# Patient Record
Sex: Male | Born: 1945
Health system: Southern US, Community
[De-identification: ages and names within clinical notes are randomized; demographics above are authoritative.]

## PROBLEM LIST (undated history)

## (undated) DIAGNOSIS — I10 Essential (primary) hypertension: Secondary | ICD-10-CM

## (undated) DIAGNOSIS — E785 Hyperlipidemia, unspecified: Secondary | ICD-10-CM

## (undated) DIAGNOSIS — I509 Heart failure, unspecified: Secondary | ICD-10-CM

## (undated) DIAGNOSIS — N189 Chronic kidney disease, unspecified: Secondary | ICD-10-CM

## (undated) HISTORY — PX: NO PAST SURGERIES: SHX2092

## (undated) HISTORY — DX: Hyperlipidemia, unspecified: E78.5

## (undated) HISTORY — DX: Chronic kidney disease, unspecified: N18.9

---

## 2005-06-29 ENCOUNTER — Emergency Department: Payer: Self-pay | Admitting: Emergency Medicine

## 2007-12-11 ENCOUNTER — Emergency Department: Payer: Self-pay | Admitting: Internal Medicine

## 2016-04-16 DIAGNOSIS — M79675 Pain in left toe(s): Secondary | ICD-10-CM | POA: Diagnosis not present

## 2016-04-16 DIAGNOSIS — M79674 Pain in right toe(s): Secondary | ICD-10-CM | POA: Diagnosis not present

## 2016-04-16 DIAGNOSIS — L600 Ingrowing nail: Secondary | ICD-10-CM | POA: Diagnosis not present

## 2016-04-16 DIAGNOSIS — B353 Tinea pedis: Secondary | ICD-10-CM | POA: Diagnosis not present

## 2016-04-16 DIAGNOSIS — B351 Tinea unguium: Secondary | ICD-10-CM | POA: Diagnosis not present

## 2016-12-23 DIAGNOSIS — R0982 Postnasal drip: Secondary | ICD-10-CM | POA: Diagnosis not present

## 2016-12-23 DIAGNOSIS — Z87891 Personal history of nicotine dependence: Secondary | ICD-10-CM | POA: Diagnosis not present

## 2016-12-23 DIAGNOSIS — I10 Essential (primary) hypertension: Secondary | ICD-10-CM | POA: Diagnosis not present

## 2016-12-23 DIAGNOSIS — K4090 Unilateral inguinal hernia, without obstruction or gangrene, not specified as recurrent: Secondary | ICD-10-CM | POA: Diagnosis not present

## 2017-01-07 DIAGNOSIS — Z01818 Encounter for other preprocedural examination: Secondary | ICD-10-CM | POA: Diagnosis not present

## 2017-01-07 DIAGNOSIS — K4090 Unilateral inguinal hernia, without obstruction or gangrene, not specified as recurrent: Secondary | ICD-10-CM | POA: Diagnosis not present

## 2017-01-27 DIAGNOSIS — K4090 Unilateral inguinal hernia, without obstruction or gangrene, not specified as recurrent: Secondary | ICD-10-CM | POA: Diagnosis not present

## 2017-01-30 ENCOUNTER — Encounter
Admission: RE | Admit: 2017-01-30 | Discharge: 2017-01-30 | Disposition: A | Discharge status: Home / Self Care | Payer: Medicare HMO | Source: Ambulatory Visit | Attending: Surgery | Admitting: Surgery

## 2017-01-30 DIAGNOSIS — K4090 Unilateral inguinal hernia, without obstruction or gangrene, not specified as recurrent: Secondary | ICD-10-CM | POA: Insufficient documentation

## 2017-01-30 DIAGNOSIS — Z01818 Encounter for other preprocedural examination: Secondary | ICD-10-CM | POA: Insufficient documentation

## 2017-01-30 HISTORY — DX: Essential (primary) hypertension: I10

## 2017-01-30 NOTE — Patient Instructions (Signed)
  Your procedure is scheduled on:  Tuesday, February 04, 2017 Report to Same Day Surgery 2nd floor medical mall ([facility]-take elevator on left to 2nd floor.  Check in with surgery information desk.) To find out your arrival time please call [phone] between 1PM - 3PM on Monday, February 19th  Remember: Instructions that are not followed completely may result in serious medical risk, up to and including death, or upon the discretion of your surgeon and anesthesiologist your surgery may need to be rescheduled.    _x___ 1. Do not eat food or drink liquids after midnight. No gum chewing or hard candies.     __x__ 2. No Alcohol for 24 hours before or after surgery.   __x__3. No Smoking for 24 prior to surgery.   ____  4. Bring all medications with you on the day of surgery if instructed.    __x__ 5. Notify your doctor if there is any change in your medical condition     (cold, fever, infections).     Do not wear jewelry, make-up, hairpins, clips or nail polish.  Do not wear lotions, powders, or perfumes. You may wear deodorant.  Do not shave 48 hours prior to surgery. Men may shave face and neck.  Do not bring valuables to the hospital.    [facility] is not responsible for any belongings or valuables.               Contacts, dentures or bridgework may not be worn into surgery.  Leave your suitcase in the car. After surgery it may be brought to your room.  For patients admitted to the hospital, discharge time is determined by your treatment team.   Patients discharged the day of surgery will not be allowed to drive home.  You will need someone to drive you home and stay with you the night of your procedure.    Please read over the following fact sheets that you were given:   [facility] Preparing for Surgery and or MRSA Information   _x___ Take these medicines the morning of surgery with A SIP OF WATER:    1. AMLODIPINE  2.  3.  4.  5.  6.  ____Fleets enema or  Magnesium Citrate as directed.   _x___ Use CHG Soap or sage wipes as directed on instruction sheet   ____ Use inhalers on the day of surgery and bring to hospital day of surgery  ____ Stop metformin 2 days prior to surgery    ____ Take 1/2 of usual insulin dose the night before surgery and none on the morning of           surgery.   ____ Stop Aspirin, Coumadin, Pllavix ,Eliquis, Effient, or Pradaxa  x__ Stop Anti-inflammatories such as Advil, Aleve, Ibuprofen, Motrin, Naproxen,          Naprosyn, Goodies powders or aspirin products. Ok to take Tylenol.   ____ Stop supplements until after surgery.    ____ Bring C-Pap to the hospital.

## 2017-02-03 MED ORDER — CEFAZOLIN SODIUM-DEXTROSE 2-4 GM/100ML-% IV SOLN
2.0000 g | Freq: Once | INTRAVENOUS | Status: AC
  Administered 2017-02-04: 2 g via INTRAVENOUS

## 2017-02-04 ENCOUNTER — Ambulatory Visit: Payer: Medicare HMO | Admitting: Anesthesiology

## 2017-02-04 ENCOUNTER — Encounter: Payer: Self-pay | Admitting: *Deleted

## 2017-02-04 ENCOUNTER — Encounter
Admission: RE | Disposition: A | Discharge status: Home / Self Care | Payer: Self-pay | Source: Ambulatory Visit | Attending: Surgery

## 2017-02-04 ENCOUNTER — Ambulatory Visit
Admission: RE | Admit: 2017-02-04 | Discharge: 2017-02-04 | Disposition: A | Discharge status: Home / Self Care | Payer: Medicare HMO | Source: Ambulatory Visit | Attending: Surgery | Admitting: Surgery

## 2017-02-04 DIAGNOSIS — K4090 Unilateral inguinal hernia, without obstruction or gangrene, not specified as recurrent: Secondary | ICD-10-CM | POA: Diagnosis not present

## 2017-02-04 DIAGNOSIS — I10 Essential (primary) hypertension: Secondary | ICD-10-CM | POA: Diagnosis not present

## 2017-02-04 DIAGNOSIS — Z87891 Personal history of nicotine dependence: Secondary | ICD-10-CM | POA: Insufficient documentation

## 2017-02-04 DIAGNOSIS — D176 Benign lipomatous neoplasm of spermatic cord: Secondary | ICD-10-CM | POA: Diagnosis not present

## 2017-02-04 HISTORY — PX: INGUINAL HERNIA REPAIR: SHX194

## 2017-02-04 SURGERY — REPAIR, HERNIA, INGUINAL, ADULT
Anesthesia: General | Site: Abdomen | Laterality: Right | Wound class: Clean

## 2017-02-04 MED ORDER — FENTANYL CITRATE (PF) 100 MCG/2ML IJ SOLN
25.0000 ug | INTRAMUSCULAR | Status: DC | PRN
  Administered 2017-02-04 (×4): 25 ug via INTRAVENOUS

## 2017-02-04 MED ORDER — HYDROCODONE-ACETAMINOPHEN 5-325 MG PO TABS
ORAL_TABLET | ORAL | Status: AC
  Filled 2017-02-04: qty 1

## 2017-02-04 MED ORDER — FENTANYL CITRATE (PF) 100 MCG/2ML IJ SOLN
INTRAMUSCULAR | Status: AC
  Administered 2017-02-04: 25 ug via INTRAVENOUS
  Filled 2017-02-04: qty 2

## 2017-02-04 MED ORDER — FAMOTIDINE 20 MG PO TABS
ORAL_TABLET | ORAL | Status: AC
  Filled 2017-02-04: qty 1

## 2017-02-04 MED ORDER — PROPOFOL 10 MG/ML IV BOLUS
INTRAVENOUS | Status: DC | PRN
  Administered 2017-02-04: 200 mg via INTRAVENOUS

## 2017-02-04 MED ORDER — HYDROCODONE-ACETAMINOPHEN 5-325 MG PO TABS: 1.0000 | ORAL_TABLET | ORAL | 0 refills | Status: DC | PRN

## 2017-02-04 MED ORDER — PROPOFOL 10 MG/ML IV BOLUS
INTRAVENOUS | Status: AC
  Filled 2017-02-04: qty 20

## 2017-02-04 MED ORDER — ONDANSETRON HCL 4 MG/2ML IJ SOLN
INTRAMUSCULAR | Status: DC | PRN
  Administered 2017-02-04: 4 mg via INTRAVENOUS

## 2017-02-04 MED ORDER — SEVOFLURANE IN SOLN
RESPIRATORY_TRACT | Status: AC
  Filled 2017-02-04: qty 250

## 2017-02-04 MED ORDER — LIDOCAINE HCL (PF) 2 % IJ SOLN
INTRAMUSCULAR | Status: AC
  Filled 2017-02-04: qty 2

## 2017-02-04 MED ORDER — ONDANSETRON HCL 4 MG/2ML IJ SOLN: 4.0000 mg | Freq: Once | INTRAMUSCULAR | Status: DC | PRN

## 2017-02-04 MED ORDER — LABETALOL HCL 5 MG/ML IV SOLN
INTRAVENOUS | Status: DC | PRN
  Administered 2017-02-04: 5 mg via INTRAVENOUS

## 2017-02-04 MED ORDER — EPHEDRINE SULFATE 50 MG/ML IJ SOLN
INTRAMUSCULAR | Status: DC | PRN
  Administered 2017-02-04: 10 mg via INTRAVENOUS

## 2017-02-04 MED ORDER — FAMOTIDINE 20 MG PO TABS
20.0000 mg | ORAL_TABLET | Freq: Once | ORAL | Status: AC
  Administered 2017-02-04: 20 mg via ORAL

## 2017-02-04 MED ORDER — FENTANYL CITRATE (PF) 100 MCG/2ML IJ SOLN
INTRAMUSCULAR | Status: DC | PRN
  Administered 2017-02-04 (×2): 50 ug via INTRAVENOUS

## 2017-02-04 MED ORDER — SUGAMMADEX SODIUM 200 MG/2ML IV SOLN
INTRAVENOUS | Status: DC | PRN
  Administered 2017-02-04: 200 mg via INTRAVENOUS

## 2017-02-04 MED ORDER — DEXAMETHASONE SODIUM PHOSPHATE 10 MG/ML IJ SOLN
INTRAMUSCULAR | Status: AC
  Filled 2017-02-04: qty 1

## 2017-02-04 MED ORDER — HYDROCODONE-ACETAMINOPHEN 5-325 MG PO TABS
1.0000 | ORAL_TABLET | ORAL | Status: DC | PRN
  Administered 2017-02-04: 1 via ORAL

## 2017-02-04 MED ORDER — FENTANYL CITRATE (PF) 100 MCG/2ML IJ SOLN
INTRAMUSCULAR | Status: AC
  Filled 2017-02-04: qty 2

## 2017-02-04 MED ORDER — LACTATED RINGERS IV SOLN
INTRAVENOUS | Status: DC
  Administered 2017-02-04: 11:00:00 via INTRAVENOUS

## 2017-02-04 MED ORDER — ROCURONIUM BROMIDE 100 MG/10ML IV SOLN
INTRAVENOUS | Status: DC | PRN
  Administered 2017-02-04: 40 mg via INTRAVENOUS

## 2017-02-04 MED ORDER — ONDANSETRON HCL 4 MG/2ML IJ SOLN
INTRAMUSCULAR | Status: AC
  Filled 2017-02-04: qty 2

## 2017-02-04 MED ORDER — DEXAMETHASONE SODIUM PHOSPHATE 10 MG/ML IJ SOLN
INTRAMUSCULAR | Status: DC | PRN
  Administered 2017-02-04: 10 mg via INTRAVENOUS

## 2017-02-04 MED ORDER — BUPIVACAINE-EPINEPHRINE (PF) 0.5% -1:200000 IJ SOLN
INTRAMUSCULAR | Status: DC | PRN
  Administered 2017-02-04: 25 mL via PERINEURAL

## 2017-02-04 MED ORDER — LIDOCAINE HCL (CARDIAC) 20 MG/ML IV SOLN
INTRAVENOUS | Status: DC | PRN
Start: 2017-02-04 — End: 2017-02-04
  Administered 2017-02-04: 100 mg via INTRAVENOUS

## 2017-02-04 MED ORDER — LABETALOL HCL 5 MG/ML IV SOLN
INTRAVENOUS | Status: AC
  Filled 2017-02-04: qty 4

## 2017-02-04 MED ORDER — CEFAZOLIN SODIUM-DEXTROSE 2-4 GM/100ML-% IV SOLN
INTRAVENOUS | Status: AC
  Administered 2017-02-04: 2000 mg
  Filled 2017-02-04: qty 100

## 2017-02-04 SURGICAL SUPPLY — 26 items
BLADE SURG 15 STRL LF DISP TIS (BLADE) ×1 IMPLANT
BLADE SURG 15 STRL SS (BLADE) ×2
CANISTER SUCT 1200ML W/VALVE (MISCELLANEOUS) ×3 IMPLANT
CHLORAPREP W/TINT 26ML (MISCELLANEOUS) ×3 IMPLANT
DERMABOND ADVANCED (GAUZE/BANDAGES/DRESSINGS) ×2
DERMABOND ADVANCED .7 DNX12 (GAUZE/BANDAGES/DRESSINGS) ×1 IMPLANT
DRAIN PENROSE 5/8X18 LTX STRL (WOUND CARE) ×3 IMPLANT
DRAPE LAPAROTOMY 77X122 PED (DRAPES) ×3 IMPLANT
ELECT REM PT RETURN 9FT ADLT (ELECTROSURGICAL) ×3
ELECTRODE REM PT RTRN 9FT ADLT (ELECTROSURGICAL) ×1 IMPLANT
GLOVE BIO SURGEON STRL SZ7.5 (GLOVE) ×3 IMPLANT
GOWN STRL REUS W/ TWL LRG LVL3 (GOWN DISPOSABLE) ×3 IMPLANT
GOWN STRL REUS W/TWL LRG LVL3 (GOWN DISPOSABLE) ×6
KIT RM TURNOVER STRD PROC AR (KITS) ×3 IMPLANT
LABEL OR SOLS (LABEL) ×3 IMPLANT
MESH SYNTHETIC 4X6 SOFT BARD (Mesh General) ×1 IMPLANT
MESH SYNTHETIC SOFT BARD 4X6 (Mesh General) ×2 IMPLANT
NEEDLE HYPO 25X1 1.5 SAFETY (NEEDLE) ×3 IMPLANT
NS IRRIG 500ML POUR BTL (IV SOLUTION) ×3 IMPLANT
PACK BASIN MINOR ARMC (MISCELLANEOUS) ×3 IMPLANT
SUT CHROMIC 4 0 RB 1X27 (SUTURE) ×3 IMPLANT
SUT MNCRL AB 4-0 PS2 18 (SUTURE) ×3 IMPLANT
SUT SURGILON 0 30 BLK (SUTURE) ×9 IMPLANT
SUT VIC AB 4-0 SH 27 (SUTURE) ×4
SUT VIC AB 4-0 SH 27XANBCTRL (SUTURE) ×2 IMPLANT
SYRINGE 10CC LL (SYRINGE) ×3 IMPLANT

## 2017-02-04 NOTE — Discharge Instructions (Addendum)
Take Tylenol or Norco if needed for pain.  Should not drive or do anything dangerous when taking Norco.  May shower.  Avoid straining and heavy lifting.      AMBULATORY SURGERY  DISCHARGE INSTRUCTIONS   1) The drugs that you were given will stay in your system until tomorrow so for the next 24 hours you should not:  A) Drive an automobile B) Make any legal decisions C) Drink any alcoholic beverage   2) You may resume regular meals tomorrow.  Today it is better to start with liquids and gradually work up to solid foods.  You may eat anything you prefer, but it is better to start with liquids, then soup and crackers, and gradually work up to solid foods.   3) Please notify your doctor immediately if you have any unusual bleeding, trouble breathing, redness and pain at the surgery site, drainage, fever, or pain not relieved by medication.    4) Additional Instructions:        Please contact your physician with any problems or Same Day Surgery at [phone], Monday through Friday 6 am to 4 pm, or [facility] number at [phone].

## 2017-02-04 NOTE — Anesthesia Preprocedure Evaluation (Addendum)
Anesthesia Evaluation  Patient identified by MRN, date of birth, ID band Patient awake    Reviewed: Allergy & Precautions, NPO status , Patient's Chart, lab work & pertinent test results  Airway Mallampati: II  TM Distance: >3 FB     Dental  (+) Loose, Poor Dentition, Missing   Pulmonary former smoker,    Pulmonary exam normal        Cardiovascular hypertension, Pt. on medications Normal cardiovascular exam     Neuro/Psych negative neurological ROS  negative psych ROS   GI/Hepatic Neg liver ROS,   Endo/Other  negative endocrine ROS  Renal/GU negative Renal ROS     Musculoskeletal negative musculoskeletal ROS (+)   Abdominal Normal abdominal exam  (+)   Peds  Hematology negative hematology ROS (+)   Anesthesia Other Findings   Reproductive/Obstetrics                             Anesthesia Physical Anesthesia Plan  ASA: II  Anesthesia Plan: General   Post-op Pain Management:    Induction: Intravenous  Airway Management Planned: Oral ETT  Additional Equipment:   Intra-op Plan:   Post-operative Plan: Extubation in OR  Informed Consent:   Dental advisory given  Plan Discussed with: CRNA and Surgeon  Anesthesia Plan Comments: (Patient was instructed that he will very likely lose one of his top upper teeth, due to the looseness and the severity of his peridontal disease.  Patient understands and stated that the teeth need to be pulled anyway.)       Anesthesia Quick Evaluation

## 2017-02-04 NOTE — Op Note (Signed)
OPERATIVE REPORT  PREOPERATIVE DIAGNOSIS: right inguinal hernia  POSTOPERATIVE DIAGNOSIS:right  inguinal hernia  PROCEDURE:  right inguinal hernia repair  ANESTHESIA:  General  SURGEON:  Rochel Brome M.D.  INDICATIONS: He reports recent bulging in the right groin. On physical exam a right inguinal hernia was demonstrated. Surgery was recommended for definitive treatment.  With the patient on the operating table in the supine position the right lower quadrant was prepared with clippers and with ChloraPrep and draped in a sterile manner. A transversely oriented suprapubic incision was made and carried down through subcutaneous tissues. Electrocautery was used for hemostasis. The Scarpa's fascia was incised. The external oblique aponeurosis was incised along the course of its fibers to open the external ring and expose the inguinal cord structures. The cord structures were mobilized. A Penrose drain was passed around the cord structures for traction. Cremaster fibers were spread to expose an indirect hernia sac which was dissected free from surrounding structures. The sac was  7 cm in length. The sac was opened. Its continuity with the peritoneal cavity was demonstrated. A high ligation of the sac was done with a 4-0 Vicryl suture ligature. The stump was allowed to retract. A cord lipoma some 9 cm in length was dissected free from surrounding structures and suture ligated and amputated and was not submitted for pathology. The repair was carried out with 0 Surgilon sutures suturing the conjoined tendon to the shelving edge of the inguinal ligament. The last stitch led to satisfactory narrowing of the internal ring.  Bard soft mesh was cut to create an oval shape and was placed over the repair. This was sutured to the repair with interrupted 0 Surgilon sutures and also sutured medially to the deep fascia and on both sides of the internal ring. Next after seeing hemostasis was intact the cord structures were  replaced along the floor of the inguinal canal. The cut edges of the external oblique aponeurosis were closed with a running 4-0 Vicryl suture to re-create the external ring. The deep fascia superior and lateral to the repair site was infiltrated with half percent Sensorcaine with epinephrine. Subcutaneous tissues were also infiltrated. The Scarpa's fascia was closed with interrupted 4-0 Vicryl sutures. The skin was closed with running 4-0 Monocryl subcuticular suture and Dermabond. The testicle remained in the scrotum  The patient appeared to be in satisfactory condition and was prepared for transfer to the recovery room.  Rochel Brome M.D.

## 2017-02-04 NOTE — Anesthesia Postprocedure Evaluation (Signed)
Anesthesia Post Note  Patient: John [name]  Procedure(s) Performed: Procedure(s) (LRB): HERNIA REPAIR INGUINAL ADULT (Right)  Patient location during evaluation: PACU Anesthesia Type: General Level of consciousness: awake and alert and oriented Pain management: pain level controlled Vital Signs Assessment: post-procedure vital signs reviewed and stable Respiratory status: spontaneous breathing Cardiovascular status: blood pressure returned to baseline Anesthetic complications: no     Last Vitals:  Vitals:   02/04/17 1400 02/04/17 1405  BP: 112/76   Pulse: (!) 59 62  Resp: 10 10  Temp: 36.4 C     Last Pain:  Vitals:   02/04/17 1400  TempSrc:   PainSc: 1                  Yaqueline Gutter

## 2017-02-04 NOTE — Transfer of Care (Signed)
Immediate Anesthesia Transfer of Care Note  Patient: John [name]  Procedure(s) Performed: Procedure(s): HERNIA REPAIR INGUINAL ADULT (Right)  Patient Location: PACU  Anesthesia Type:General  Level of Consciousness: sedated  Airway & Oxygen Therapy: Patient Spontanous Breathing and Patient connected to face mask oxygen  Post-op Assessment: Report given to RN and Post -op Vital signs reviewed and stable  Post vital signs: Reviewed and stable  Last Vitals:  Vitals:   02/04/17 1011 02/04/17 1308  BP: (!) 178/98   Pulse: 65 78  Resp: 16 15  Temp: 36.7 C 36.3 C    Last Pain:  Vitals:   02/04/17 1011  TempSrc: Oral         Complications: No apparent anesthesia complications

## 2017-02-04 NOTE — Anesthesia Procedure Notes (Addendum)
Procedure Name: Intubation Date/Time: 02/04/2017 11:34 AM Performed by: Justus Memory Pre-anesthesia Checklist: Patient identified, Emergency Drugs available, Suction available and Patient being monitored Patient Re-evaluated:Patient Re-evaluated prior to inductionOxygen Delivery Method: Circle system utilized Preoxygenation: Pre-oxygenation with 100% oxygen Intubation Type: IV induction Ventilation: Mask ventilation with difficulty Laryngoscope Size: Mac and 3 Grade View: Grade II Tube type: Oral Tube size: 7.0 mm Airway Equipment and Method: Patient positioned with wedge pillow and Stylet Placement Confirmation: breath sounds checked- equal and bilateral and positive ETCO2 Secured at: 21 cm Tube secured with: Tape Dental Injury: Teeth and Oropharynx as per pre-operative assessment  Difficulty Due To: Difficulty was unanticipated

## 2017-02-04 NOTE — Anesthesia Post-op Follow-up Note (Cosign Needed)
Anesthesia QCDR form completed.        

## 2017-02-04 NOTE — H&P (Signed)
  He comes in today prepared for right inguinal hernia repair.  He reports no change in overall condition since the recent office visit.  Lab work was reviewed.  The right side was marked YES.  I discussed the plan for right inguinal hernia repair

## 2017-02-05 ENCOUNTER — Encounter: Payer: Self-pay | Admitting: Surgery

## 2017-08-20 DIAGNOSIS — Z0000 Encounter for general adult medical examination without abnormal findings: Secondary | ICD-10-CM | POA: Diagnosis not present

## 2017-08-20 DIAGNOSIS — I10 Essential (primary) hypertension: Secondary | ICD-10-CM | POA: Diagnosis not present

## 2017-08-20 DIAGNOSIS — E785 Hyperlipidemia, unspecified: Secondary | ICD-10-CM | POA: Diagnosis not present

## 2018-02-13 DIAGNOSIS — H538 Other visual disturbances: Secondary | ICD-10-CM | POA: Diagnosis not present

## 2018-02-13 DIAGNOSIS — H5711 Ocular pain, right eye: Secondary | ICD-10-CM | POA: Diagnosis not present

## 2018-02-18 DIAGNOSIS — H1089 Other conjunctivitis: Secondary | ICD-10-CM | POA: Diagnosis not present

## 2018-02-18 DIAGNOSIS — I10 Essential (primary) hypertension: Secondary | ICD-10-CM | POA: Diagnosis not present

## 2018-02-20 DIAGNOSIS — H209 Unspecified iridocyclitis: Secondary | ICD-10-CM | POA: Diagnosis not present

## 2018-11-03 DIAGNOSIS — M659 Synovitis and tenosynovitis, unspecified: Secondary | ICD-10-CM | POA: Diagnosis not present

## 2018-11-03 DIAGNOSIS — I10 Essential (primary) hypertension: Secondary | ICD-10-CM | POA: Diagnosis not present

## 2018-11-03 DIAGNOSIS — Z0000 Encounter for general adult medical examination without abnormal findings: Secondary | ICD-10-CM | POA: Diagnosis not present

## 2018-11-03 DIAGNOSIS — Z87891 Personal history of nicotine dependence: Secondary | ICD-10-CM | POA: Diagnosis not present

## 2019-09-01 ENCOUNTER — Emergency Department: Payer: Medicare HMO

## 2019-09-01 ENCOUNTER — Inpatient Hospital Stay
Admission: EM | Admit: 2019-09-01 | Discharge: 2019-09-03 | DRG: 193 | Disposition: A | Discharge status: Home / Self Care | Payer: Medicare HMO | Source: Ambulatory Visit | Attending: Internal Medicine | Admitting: Internal Medicine

## 2019-09-01 ENCOUNTER — Other Ambulatory Visit: Payer: Self-pay

## 2019-09-01 DIAGNOSIS — R778 Other specified abnormalities of plasma proteins: Secondary | ICD-10-CM

## 2019-09-01 DIAGNOSIS — I10 Essential (primary) hypertension: Secondary | ICD-10-CM | POA: Diagnosis present

## 2019-09-01 DIAGNOSIS — Z79899 Other long term (current) drug therapy: Secondary | ICD-10-CM | POA: Diagnosis not present

## 2019-09-01 DIAGNOSIS — J9601 Acute respiratory failure with hypoxia: Secondary | ICD-10-CM | POA: Diagnosis present

## 2019-09-01 DIAGNOSIS — I248 Other forms of acute ischemic heart disease: Secondary | ICD-10-CM | POA: Diagnosis present

## 2019-09-01 DIAGNOSIS — R7989 Other specified abnormal findings of blood chemistry: Secondary | ICD-10-CM | POA: Diagnosis not present

## 2019-09-01 DIAGNOSIS — J439 Emphysema, unspecified: Secondary | ICD-10-CM | POA: Diagnosis not present

## 2019-09-01 DIAGNOSIS — R635 Abnormal weight gain: Secondary | ICD-10-CM | POA: Diagnosis not present

## 2019-09-01 DIAGNOSIS — R0902 Hypoxemia: Secondary | ICD-10-CM

## 2019-09-01 DIAGNOSIS — Z87891 Personal history of nicotine dependence: Secondary | ICD-10-CM | POA: Diagnosis not present

## 2019-09-01 DIAGNOSIS — J189 Pneumonia, unspecified organism: Secondary | ICD-10-CM | POA: Diagnosis not present

## 2019-09-01 DIAGNOSIS — F17200 Nicotine dependence, unspecified, uncomplicated: Secondary | ICD-10-CM | POA: Diagnosis not present

## 2019-09-01 DIAGNOSIS — R140 Abdominal distension (gaseous): Secondary | ICD-10-CM | POA: Diagnosis not present

## 2019-09-01 DIAGNOSIS — N179 Acute kidney failure, unspecified: Secondary | ICD-10-CM | POA: Diagnosis not present

## 2019-09-01 DIAGNOSIS — R600 Localized edema: Secondary | ICD-10-CM | POA: Diagnosis not present

## 2019-09-01 DIAGNOSIS — Z20828 Contact with and (suspected) exposure to other viral communicable diseases: Secondary | ICD-10-CM | POA: Diagnosis present

## 2019-09-01 DIAGNOSIS — N289 Disorder of kidney and ureter, unspecified: Secondary | ICD-10-CM | POA: Diagnosis not present

## 2019-09-01 DIAGNOSIS — J440 Chronic obstructive pulmonary disease with acute lower respiratory infection: Secondary | ICD-10-CM | POA: Diagnosis present

## 2019-09-01 DIAGNOSIS — R0600 Dyspnea, unspecified: Secondary | ICD-10-CM | POA: Diagnosis not present

## 2019-09-01 LAB — CBC WITH DIFFERENTIAL/PLATELET
Abs Immature Granulocytes: 0.1 10*3/uL — ABNORMAL HIGH (ref 0.00–0.07)
Basophils Absolute: 0 10*3/uL (ref 0.0–0.1)
Basophils Relative: 0 %
Eosinophils Absolute: 0 10*3/uL (ref 0.0–0.5)
Eosinophils Relative: 0 %
HCT: 48.1 % (ref 39.0–52.0)
Hemoglobin: 14.5 g/dL (ref 13.0–17.0)
Immature Granulocytes: 1 %
Lymphocytes Relative: 10 %
Lymphs Abs: 1 10*3/uL (ref 0.7–4.0)
MCH: 30.3 pg (ref 26.0–34.0)
MCHC: 30.1 g/dL (ref 30.0–36.0)
MCV: 100.4 fL — ABNORMAL HIGH (ref 80.0–100.0)
Monocytes Absolute: 0.6 10*3/uL (ref 0.1–1.0)
Monocytes Relative: 6 %
Neutro Abs: 8.6 10*3/uL — ABNORMAL HIGH (ref 1.7–7.7)
Neutrophils Relative %: 83 %
Platelets: 166 10*3/uL (ref 150–400)
RBC: 4.79 MIL/uL (ref 4.22–5.81)
RDW: 13.2 % (ref 11.5–15.5)
WBC: 10.4 10*3/uL (ref 4.0–10.5)
nRBC: 0.2 % (ref 0.0–0.2)

## 2019-09-01 LAB — BASIC METABOLIC PANEL
Anion gap: 8 (ref 5–15)
BUN: 16 mg/dL (ref 8–23)
CO2: 34 mmol/L — ABNORMAL HIGH (ref 22–32)
Calcium: 9.2 mg/dL (ref 8.9–10.3)
Chloride: 102 mmol/L (ref 98–111)
Creatinine, Ser: 1.45 mg/dL — ABNORMAL HIGH (ref 0.61–1.24)
GFR calc Af Amer: 55 mL/min — ABNORMAL LOW (ref >60)
GFR calc non Af Amer: 47 mL/min — ABNORMAL LOW (ref >60)
Glucose, Bld: 126 mg/dL — ABNORMAL HIGH (ref 70–99)
Potassium: 4.2 mmol/L (ref 3.5–5.1)
Sodium: 144 mmol/L (ref 135–145)

## 2019-09-01 LAB — BLOOD GAS, ARTERIAL
Acid-Base Excess: 7.9 mmol/L — ABNORMAL HIGH (ref 0.0–2.0)
Bicarbonate: 36.4 mmol/L — ABNORMAL HIGH (ref 20.0–28.0)
FIO2: 0.36
O2 Saturation: 95.5 %
Patient temperature: 37
pCO2 arterial: 69 mmHg (ref 32.0–48.0)
pH, Arterial: 7.33 — ABNORMAL LOW (ref 7.350–7.450)
pO2, Arterial: 84 mmHg (ref 83.0–108.0)

## 2019-09-01 LAB — EXPECTORATED SPUTUM ASSESSMENT W GRAM STAIN, RFLX TO RESP C

## 2019-09-01 LAB — BRAIN NATRIURETIC PEPTIDE: B Natriuretic Peptide: 116 pg/mL — ABNORMAL HIGH (ref 0.0–100.0)

## 2019-09-01 LAB — FIBRIN DERIVATIVES D-DIMER (ARMC ONLY): Fibrin derivatives D-dimer (ARMC): 1300.95 ng/mL (FEU) — ABNORMAL HIGH (ref 0.00–499.00)

## 2019-09-01 LAB — SARS CORONAVIRUS 2 BY RT PCR (HOSPITAL ORDER, PERFORMED IN ~~LOC~~ HOSPITAL LAB): SARS Coronavirus 2: NEGATIVE

## 2019-09-01 LAB — TROPONIN I (HIGH SENSITIVITY): Troponin I (High Sensitivity): 264 ng/L (ref <18)

## 2019-09-01 MED ORDER — SODIUM CHLORIDE 0.9 % IV SOLN
2.0000 g | INTRAVENOUS | Status: DC
  Administered 2019-09-01 – 2019-09-02 (×2): 2 g via INTRAVENOUS
  Filled 2019-09-01 (×2): qty 20
  Filled 2019-09-01: qty 2

## 2019-09-01 MED ORDER — HEPARIN SODIUM (PORCINE) 5000 UNIT/ML IJ SOLN
5000.0000 [IU] | Freq: Three times a day (TID) | INTRAMUSCULAR | Status: DC
  Filled 2019-09-01: qty 1

## 2019-09-01 MED ORDER — IPRATROPIUM-ALBUTEROL 0.5-2.5 (3) MG/3ML IN SOLN
3.0000 mL | RESPIRATORY_TRACT | Status: DC
  Administered 2019-09-01 (×3): 3 mL via RESPIRATORY_TRACT
  Filled 2019-09-01 (×3): qty 3

## 2019-09-01 MED ORDER — IOHEXOL 350 MG/ML SOLN
75.0000 mL | Freq: Once | INTRAVENOUS | Status: AC | PRN
  Administered 2019-09-01: 75 mL via INTRAVENOUS

## 2019-09-01 MED ORDER — METHYLPREDNISOLONE SODIUM SUCC 125 MG IJ SOLR
125.0000 mg | Freq: Once | INTRAMUSCULAR | Status: AC
  Administered 2019-09-01: 125 mg via INTRAVENOUS
  Filled 2019-09-01: qty 2

## 2019-09-01 MED ORDER — SODIUM CHLORIDE 0.9 % IV SOLN
500.0000 mg | INTRAVENOUS | Status: DC
  Administered 2019-09-02: 500 mg via INTRAVENOUS
  Filled 2019-09-01 (×2): qty 500

## 2019-09-01 MED ORDER — IPRATROPIUM-ALBUTEROL 0.5-2.5 (3) MG/3ML IN SOLN
3.0000 mL | Freq: Once | RESPIRATORY_TRACT | Status: AC
  Administered 2019-09-01: 3 mL via RESPIRATORY_TRACT
  Filled 2019-09-01: qty 3

## 2019-09-01 MED ORDER — AMLODIPINE BESYLATE 5 MG PO TABS
5.0000 mg | ORAL_TABLET | Freq: Every day | ORAL | Status: DC
  Administered 2019-09-02 – 2019-09-03 (×2): 5 mg via ORAL
  Filled 2019-09-01 (×2): qty 1

## 2019-09-01 MED ORDER — LEVOFLOXACIN IN D5W 750 MG/150ML IV SOLN
750.0000 mg | Freq: Once | INTRAVENOUS | Status: AC
  Administered 2019-09-01: 14:00:00 750 mg via INTRAVENOUS
  Filled 2019-09-01: qty 150

## 2019-09-01 MED ORDER — SODIUM CHLORIDE 0.9 % IV SOLN
INTRAVENOUS | Status: DC
  Administered 2019-09-01 – 2019-09-02 (×3): via INTRAVENOUS

## 2019-09-01 MED ORDER — IPRATROPIUM-ALBUTEROL 0.5-2.5 (3) MG/3ML IN SOLN
3.0000 mL | Freq: Once | RESPIRATORY_TRACT | Status: AC
  Administered 2019-09-01: 11:00:00 3 mL via RESPIRATORY_TRACT
  Filled 2019-09-01: qty 3

## 2019-09-01 MED ORDER — ORAL CARE MOUTH RINSE
15.0000 mL | Freq: Two times a day (BID) | OROMUCOSAL | Status: DC
  Administered 2019-09-02 – 2019-09-03 (×3): 15 mL via OROMUCOSAL

## 2019-09-01 NOTE — ED Notes (Signed)
Admitting MD at bedside.

## 2019-09-01 NOTE — ED Notes (Signed)
MD made aware of negative COVID test.

## 2019-09-01 NOTE — ED Triage Notes (Signed)
Pt arrived to ED from [facility] for low O2 Sat in 60's. PT reports new onset of cough and SOB with exertion over past few days. EDP at bedside. Placed on 4L [place] and sat 95%. NAD noted at this time.

## 2019-09-01 NOTE — ED Notes (Signed)
Oxygen saturation dropped. RN to bedside, pt talking on phone and denies feeling SOB. No increased WOB noted. Pt hung up phone and while resting in bed oxygen saturation immediately increased.

## 2019-09-01 NOTE — H&P (Signed)
John [name] at [facility] NAME: John [name]    MR#:  [id number]  DATE OF BIRTH:  June 21, 1946  DATE OF ADMISSION:  09/01/2019  PRIMARY CARE PHYSICIAN: Tracie Harrier, MD   REQUESTING/REFERRING PHYSICIAN:   CHIEF COMPLAINT:   Chief Complaint  Patient presents with  . Shortness of Breath    HISTORY OF PRESENT ILLNESS: John [name]  is a 73 y.o. male with a known history of  Hypertension and very active at baseline-was feeling short of breath with exertion and productive cough for last 4 to 5 days but he does not have any fever or chills.  He continued to get worse and sputum started turning to yellowish-brown now so decided to come to emergency room. His COVID-19 test is negative but he was noted to have pneumonia on chest x-ray and so given to admit to hospitalist service.   PAST MEDICAL HISTORY:   Past Medical History:  Diagnosis Date  . Hypertension     PAST SURGICAL HISTORY:  Past Surgical History:  Procedure Laterality Date  . INGUINAL HERNIA REPAIR Right 02/04/2017   Procedure: HERNIA REPAIR INGUINAL ADULT;  Surgeon: Leonie Green, MD;  Location: [facility] ORS;  Service: General;  Laterality: Right;  . NO PAST SURGERIES      SOCIAL HISTORY:  Social History   Tobacco Use  . Smoking status: Former Research scientist (life sciences)  . Smokeless tobacco: Never Used  Substance Use Topics  . Alcohol use: Yes    Comment: occassional    FAMILY HISTORY:  Family History  Problem Relation Age of Onset  . Diabetes Mother     DRUG ALLERGIES: No Known Allergies  REVIEW OF SYSTEMS:   CONSTITUTIONAL: No fever, fatigue or weakness.  EYES: No blurred or double vision.  EARS, NOSE, AND THROAT: No tinnitus or ear pain.  RESPIRATORY: Have cough, shortness of breath, no wheezing or hemoptysis.  CARDIOVASCULAR: No chest pain, orthopnea, edema.  GASTROINTESTINAL: No nausea, vomiting, diarrhea or abdominal pain.  GENITOURINARY: No dysuria, hematuria.   ENDOCRINE: No polyuria, nocturia,  HEMATOLOGY: No anemia, easy bruising or bleeding SKIN: No rash or lesion. MUSCULOSKELETAL: No joint pain or arthritis.   NEUROLOGIC: No tingling, numbness, weakness.  PSYCHIATRY: No anxiety or depression.   MEDICATIONS AT HOME:  Prior to Admission medications   Medication Sig Start Date End Date Taking? Authorizing Provider  amLODipine (NORVASC) 5 MG tablet Take 5 mg by mouth daily.   Yes [provider]  HYDROcodone-acetaminophen (NORCO) 5-325 MG tablet Take 1-2 tablets by mouth every 4 (four) hours as needed for moderate pain. Patient not taking: Reported on 09/01/2019 02/04/17   Leonie Green, MD      PHYSICAL EXAMINATION:   VITAL SIGNS: Blood pressure (!) 131/92, pulse 82, temperature 98.5 F (36.9 C), temperature source Oral, resp. rate 20, height 5\' 10"  (1.778 m), weight 107.5 kg, SpO2 98 %.  GENERAL:  73 y.o.-year-old patient lying in the bed with no acute distress.  EYES: Pupils equal, round, reactive to light and accommodation. No scleral icterus. Extraocular muscles intact.  HEENT: Head atraumatic, normocephalic. Oropharynx and nasopharynx clear.  NECK:  Supple, no jugular venous distention. No thyroid enlargement, no tenderness.  LUNGS: Normal breath sounds bilaterally, some wheezing, bilateral crepitation. No use of accessory muscles of respiration.  CARDIOVASCULAR: S1, S2 normal. No murmurs, rubs, or gallops.  ABDOMEN: Soft, nontender, nondistended. Bowel sounds present. No organomegaly or mass.  EXTREMITIES: No pedal edema, cyanosis, or clubbing.  NEUROLOGIC: Cranial nerves  II through XII are intact. Muscle strength 5/5 in all extremities. Sensation intact. Gait not checked.  PSYCHIATRIC: The patient is alert and oriented x 3.  SKIN: No obvious rash, lesion, or ulcer.   LABORATORY PANEL:   CBC Recent Labs  Lab 09/01/19 1029  WBC 10.4  HGB 14.5  HCT 48.1  PLT 166  MCV 100.4*  MCH 30.3  MCHC 30.1  RDW 13.2   LYMPHSABS 1.0  MONOABS 0.6  EOSABS 0.0  BASOSABS 0.0   ------------------------------------------------------------------------------------------------------------------  Chemistries  Recent Labs  Lab 09/01/19 1029  NA 144  K 4.2  CL 102  CO2 34*  GLUCOSE 126*  BUN 16  CREATININE 1.45*  CALCIUM 9.2   ------------------------------------------------------------------------------------------------------------------ estimated creatinine clearance is 55.7 mL/min (A) (by C-G formula based on SCr of 1.45 mg/dL (H)). ------------------------------------------------------------------------------------------------------------------ No results for input(s): TSH, T4TOTAL, T3FREE, THYROIDAB in the last 72 hours.  Invalid input(s): FREET3   Coagulation profile No results for input(s): INR, PROTIME in the last 168 hours. ------------------------------------------------------------------------------------------------------------------- No results for input(s): DDIMER in the last 72 hours. -------------------------------------------------------------------------------------------------------------------  Cardiac Enzymes No results for input(s): CKMB, TROPONINI, MYOGLOBIN in the last 168 hours.  Invalid input(s): CK ------------------------------------------------------------------------------------------------------------------ Invalid input(s): POCBNP  ---------------------------------------------------------------------------------------------------------------  Urinalysis No results found for: COLORURINE, APPEARANCEUR, LABSPEC, PHURINE, GLUCOSEU, HGBUR, BILIRUBINUR, KETONESUR, PROTEINUR, UROBILINOGEN, NITRITE, LEUKOCYTESUR   RADIOLOGY: Ct Angio Chest Pe W And/or Wo Contrast  Result Date: 09/01/2019 CLINICAL DATA:  73 year old male with low oxygen saturation. Concern for pulmonary embolism. EXAM: CT ANGIOGRAPHY CHEST WITH CONTRAST TECHNIQUE: Multidetector CT imaging of the chest  was performed using the standard protocol during bolus administration of intravenous contrast. Multiplanar CT image reconstructions and MIPs were obtained to evaluate the vascular anatomy. CONTRAST:  65mL OMNIPAQUE IOHEXOL 350 MG/ML SOLN COMPARISON:  Chest radiograph dated 09/01/2019. FINDINGS: Cardiovascular: There is mild cardiomegaly. Multi vessel coronary vascular calcification. Dilated ascending thoracic aorta measure up to 5 cm in greatest diameter. There is mild atherosclerotic calcification the thoracic aorta. Evaluation of the pulmonary arteries is somewhat limited due to respiratory motion artifact and suboptimal visualization of the peripheral branches. No pulmonary artery embolus identified. Mediastinum/Nodes: No hilar or mediastinal adenopathy. The esophagus is grossly unremarkable. There is a 7 mm focus of calcification in the inferior left thyroid lobe. No mediastinal fluid collection. Lungs/Pleura: There is a background of centrilobular emphysema. Small bilateral pleural effusions with associated partial compressive atelectasis of the lower lobes versus pneumonia. Clusters of nodular opacities in the anterior right upper lobe and left lower lobe concerning for an infectious process. Clinical correlation and follow-up to resolution recommended. There is no pneumothorax. The central airways are patent. Upper Abdomen: Subcentimeter right hepatic hypodense focus is not characterized. The visualized upper abdomen is otherwise unremarkable. Musculoskeletal: No chest wall abnormality. No acute or significant osseous findings. Review of the MIP images confirms the above findings. IMPRESSION: 1. No CT evidence of pulmonary embolism. 2. Emphysema with small bilateral pleural effusions and findings concerning for pneumonia. Clinical correlation and follow-up to resolution recommended. 3. Aneurysmal dilatation of the ascending thoracic aorta measuring up to 5 cm. Ascending thoracic aortic aneurysm. Recommend  semi-annual imaging followup by CTA or MRA and referral to cardiothoracic surgery if not already obtained. This recommendation follows 2010 ACCF/AHA/AATS/ACR/ASA/SCA/SCAI/SIR/STS/SVM Guidelines for the Diagnosis and Management of Patients With Thoracic Aortic Disease. Circulation. 2010; 121: K742-V956. Aortic aneurysm NOS (ICD10-I71.9) 4. Aortic Atherosclerosis (ICD10-I70.0) and Emphysema (ICD10-J43.9). Electronically Signed   By: Anner Crete M.D.   On: 09/01/2019 12:44   Dg  Chest Port 1 View  Result Date: 09/01/2019 CLINICAL DATA:  Dyspnea, hypoxia EXAM: PORTABLE CHEST 1 VIEW COMPARISON:  None. FINDINGS: Bilateral diffuse mild interstitial thickening. No pleural effusion or pneumothorax. Mild cardiomegaly. No acute osseous abnormality. IMPRESSION: Bilateral mild interstitial thickening which may reflect interstitial infection versus interstitial edema. Electronically Signed   By: Kathreen Devoid   On: 09/01/2019 10:45    EKG: Orders placed or performed during the hospital encounter of 09/01/19  . ED EKG  . ED EKG  . EKG 12-Lead  . EKG 12-Lead    IMPRESSION AND PLAN:  *Community-acquired pneumonia We will give Rocephin and azithromycin Sputum culture. Incentive spirometry.  *Emphysema Patient was a smoker for many years in the past.  Quit smoking 2 years ago. I would give DuoNeb nebulizer treatment. He would also need to be referred to pulmonologist after discharge.  *Hypertension Continue amlodipine.  *Acute renal insufficiency Monitor and avoid nephrotoxic medications. Gentle IV hydration.  All the records are reviewed and case discussed with ED provider. Management plans discussed with the patient, family and they are in agreement.  CODE STATUS: Full code    Code Status Orders  (From admission, onward)         Start     Ordered   09/01/19 1606  Full code  Continuous     09/01/19 1605        Code Status History    This patient has a current code status but no  historical code status.   Advance Care Planning Activity       TOTAL TIME TAKING CARE OF THIS PATIENT: 50 minutes.    Vaughan Basta M.D on 09/01/2019   Between 7am to 6pm - Pager - [phone]  After 6pm go to [email or web address] - password [email or web address] [facility]  Office  [phone]  CC: Primary care physician; Tracie Harrier, MD   Note: This dictation was prepared with Dragon dictation along with smaller phrase technology. Any transcriptional errors that result from this process are unintentional.

## 2019-09-01 NOTE — ED Notes (Signed)
Dr. Jimmye Norman notified of critical lab value of pCO2 69. Neb ordered.

## 2019-09-01 NOTE — Progress Notes (Signed)
Family Meeting Note  Advance Directive:yes  Today a meeting took place with the Patient.  The following clinical team members were present during this meeting:MD  The following were discussed:Patient's diagnosis: Pneumonia, hypertension, acute renal failure, Patient's progosis: Unable to determine and Goals for treatment: Full Code  Additional follow-up to be provided: PMD  Time spent during discussion:20 minutes  Vaughan Basta, MD

## 2019-09-01 NOTE — ED Notes (Signed)
Pt given water and snacks. Pt resting comfortable in room in NAD at this time.

## 2019-09-01 NOTE — ED Notes (Signed)
Lab collected blood for labs.

## 2019-09-01 NOTE — ED Notes (Signed)
RT called to draw ABG.

## 2019-09-01 NOTE — ED Provider Notes (Addendum)
[facility] Emergency Department Provider Note       Time seen: ----------------------------------------- 10:09 AM on 09/01/2019 -----------------------------------------   I have reviewed the triage vital signs and the nursing notes.  HISTORY   Chief Complaint Shortness of Breath   HPI John [name] is a 73 y.o. male with a history of hypertension and possibly diabetes who presents to the ED for shortness of breath.  Patient presents with several days of worsening shortness of breath with exertion.  He has had productive cough, no fevers or chills.  He denies any chest pain.  His room air saturations were noted to be markedly low on arrival.  He has not had any known COVID-19 contacts.  Past Medical History:  Diagnosis Date  . Hypertension     There are no active problems to display for this patient.   Past Surgical History:  Procedure Laterality Date  . INGUINAL HERNIA REPAIR Right 02/04/2017   Procedure: HERNIA REPAIR INGUINAL ADULT;  Surgeon: Leonie Green, MD;  Location: [facility] ORS;  Service: General;  Laterality: Right;  . NO PAST SURGERIES      Allergies Patient has no known allergies.  Social History Social History   Tobacco Use  . Smoking status: Former Research scientist (life sciences)  . Smokeless tobacco: Never Used  Substance Use Topics  . Alcohol use: Yes    Comment: occassional  . Drug use: No   Review of Systems Constitutional: Negative for fever. Cardiovascular: Negative for chest pain. Respiratory: Positive for shortness of breath with productive cough Gastrointestinal: Negative for abdominal pain, vomiting and diarrhea. Musculoskeletal: Negative for back pain. Skin: Negative for rash. Neurological: Negative for headaches, focal weakness or numbness.  All systems negative/normal/unremarkable except as stated in the HPI  ____________________________________________   PHYSICAL EXAM:  VITAL SIGNS: ED Triage Vitals  Enc Vitals Group     BP 09/01/19 1006 (!) 159/102     Pulse Rate 09/01/19 1006 94     Resp 09/01/19 1006 14     Temp 09/01/19 1006 98.5 F (36.9 C)     Temp Source 09/01/19 1006 Oral     SpO2 09/01/19 1006 94 %     Weight 09/01/19 1007 237 lb (107.5 kg)     Height 09/01/19 1007 5\' 10"  (1.778 m)     Head Circumference --      Peak Flow --      Pain Score 09/01/19 1007 0     Pain Loc --      Pain Edu? --      Excl. in [place]? --    Constitutional: Alert and oriented.  Mild distress Eyes: Conjunctivae are normal. Normal extraocular movements. ENT      Head: Normocephalic and atraumatic.      Nose: No congestion/rhinnorhea.      Mouth/Throat: Mucous membranes are moist.      Neck: No stridor. Cardiovascular: Normal rate, regular rhythm. No murmurs, rubs, or gallops. Respiratory: Tachypnea with clear breath sounds Gastrointestinal: Soft and nontender. Normal bowel sounds Musculoskeletal: Nontender with normal range of motion in extremities. No lower extremity tenderness nor edema. Neurologic:  Normal speech and language. No gross focal neurologic deficits are appreciated.  Skin:  Skin is warm, dry and intact. No rash noted. Psychiatric: Mood and affect are normal. Speech and behavior are normal.  ____________________________________________  EKG: Interpreted by me.  Sinus rhythm with rate of 77 bpm, normal PR interval, normal QRS size, normal QT, possible old anterior infarct  ____________________________________________  ED COURSE:  As part of my medical decision making, I reviewed the following data within the [place] History obtained from family if available, nursing notes, old chart and ekg, as well as notes from prior ED visits. Patient presented for dyspnea and profound hypoxia, we will assess with labs and imaging as indicated at this time.   Procedures  John [name] was evaluated in Emergency Department on 09/01/2019 for the symptoms described in the history of present  illness. He was evaluated in the context of the global COVID-19 pandemic, which necessitated consideration that the patient might be at risk for infection with the SARS-CoV-2 virus that causes COVID-19. Institutional protocols and algorithms that pertain to the evaluation of patients at risk for COVID-19 are in a state of rapid change based on information released by regulatory bodies including the CDC and federal and state organizations. These policies and algorithms were followed during the patient's care in the ED.  ____________________________________________   LABS (pertinent positives/negatives)  Labs Reviewed  CBC WITH DIFFERENTIAL/PLATELET - Abnormal; Notable for the following components:      Result Value   MCV 100.4 (*)    Neutro Abs 8.6 (*)    Abs Immature Granulocytes 0.10 (*)    All other components within normal limits  BASIC METABOLIC PANEL - Abnormal; Notable for the following components:   CO2 34 (*)    Glucose, Bld 126 (*)    Creatinine, Ser 1.45 (*)    GFR calc non Af Amer 47 (*)    GFR calc Af Amer 55 (*)    All other components within normal limits  BRAIN NATRIURETIC PEPTIDE - Abnormal; Notable for the following components:   B Natriuretic Peptide 116.0 (*)    All other components within normal limits  FIBRIN DERIVATIVES D-DIMER ([facility] ONLY) - Abnormal; Notable for the following components:   Fibrin derivatives D-dimer ([facility]) 1,300.95 (*)    All other components within normal limits  BLOOD GAS, ARTERIAL - Abnormal; Notable for the following components:   pH, Arterial 7.33 (*)    pCO2 arterial 69 (*)    Bicarbonate 36.4 (*)    Acid-Base Excess 7.9 (*)    All other components within normal limits  TROPONIN I (HIGH SENSITIVITY) - Abnormal; Notable for the following components:   Troponin I (High Sensitivity) 264 (*)    All other components within normal limits  SARS CORONAVIRUS 2 (HOSPITAL ORDER, [place] [facility])   CRITICAL CARE Performed  by: John [name]   Total critical care time: 30 minutes  Critical care time was exclusive of separately billable procedures and treating other patients.  Critical care was necessary to treat or prevent imminent or life-threatening deterioration.  Critical care was time spent personally by me on the following activities: development of treatment plan with patient and/or surrogate as well as nursing, discussions with consultants, evaluation of patient's response to treatment, examination of patient, obtaining history from patient or surrogate, ordering and performing treatments and interventions, ordering and review of laboratory studies, ordering and review of radiographic studies, pulse oximetry and re-evaluation of patient's condition.  RADIOLOGY Images were viewed by me  Chest x-ray  IMPRESSION:  Bilateral mild interstitial thickening which may reflect  interstitial infection versus interstitial edema.  CTA chest IMPRESSION: 1. No CT evidence of pulmonary embolism. 2. Emphysema with small bilateral pleural effusions and findings concerning for pneumonia. Clinical correlation and follow-up to resolution recommended. 3. Aneurysmal dilatation of the ascending thoracic aorta measuring up to  5 cm. Ascending thoracic aortic aneurysm. Recommend semi-annual imaging followup by CTA or MRA and referral to cardiothoracic surgery if not already obtained. This recommendation follows 2010 ACCF/AHA/AATS/ACR/ASA/SCA/SCAI/SIR/STS/SVM Guidelines for the Diagnosis and Management of Patients With Thoracic Aortic Disease. Circulation. 2010; 121: D924-Q683. Aortic aneurysm NOS (ICD10-I71.9) 4. Aortic Atherosclerosis (ICD10-I70.0) and Emphysema (ICD10-J43.9). ____________________________________________   DIFFERENTIAL DIAGNOSIS   COVID-19, PE, pneumonia, MI, COPD  FINAL ASSESSMENT AND PLAN  Dyspnea, hypoxia, emphysema, pneumonia   Plan: The patient had presented for dyspnea and  profound hypoxia.  He was placed on nasal cannula oxygen with good results.  Initial O2 sats were around 60%, on 4 L nasal cannula he improved dramatically patient's labs revealed some CO2 retention, elevated troponin, and elevated d-dimer. Patient's imaging initially on x-ray was reassuring, but CT was done which revealed emphysema and small bilateral pleural effusions with evidence of pneumonia.  He also has been aortic aneurysm.  Patient still remains hypoxic despite duo nebs and steroids.  He was given IV Levaquin.  Initially he has declined hospitalization but subsequently has agreed to admission.   John Aly, MD    Note: This note was generated in part or whole with voice recognition software. Voice recognition is usually quite accurate but there are transcription errors that can and very often do occur. I apologize for any typographical errors that were not detected and corrected.     Earleen Newport, MD 09/01/19 1357    Earleen Newport, MD 09/01/19 [phone]

## 2019-09-01 NOTE — ED Notes (Signed)
MD at bedside, pt doesn't want to be admitted MD removed O2 to assess if pt was stable to be discharged. O2 dropped immediately and pt encouraged to be admitted.

## 2019-09-02 LAB — BASIC METABOLIC PANEL
Anion gap: 13 (ref 5–15)
BUN: 14 mg/dL (ref 8–23)
CO2: 28 mmol/L (ref 22–32)
Calcium: 9.1 mg/dL (ref 8.9–10.3)
Chloride: 100 mmol/L (ref 98–111)
Creatinine, Ser: 1.09 mg/dL (ref 0.61–1.24)
GFR calc Af Amer: >60 mL/min (ref >60)
GFR calc non Af Amer: >60 mL/min (ref >60)
Glucose, Bld: 129 mg/dL — ABNORMAL HIGH (ref 70–99)
Potassium: 5 mmol/L (ref 3.5–5.1)
Sodium: 141 mmol/L (ref 135–145)

## 2019-09-02 LAB — CBC
HCT: 51.5 % (ref 39.0–52.0)
Hemoglobin: 15.2 g/dL (ref 13.0–17.0)
MCH: 30.2 pg (ref 26.0–34.0)
MCHC: 29.5 g/dL — ABNORMAL LOW (ref 30.0–36.0)
MCV: 102.2 fL — ABNORMAL HIGH (ref 80.0–100.0)
Platelets: 175 10*3/uL (ref 150–400)
RBC: 5.04 MIL/uL (ref 4.22–5.81)
RDW: 13.5 % (ref 11.5–15.5)
WBC: 9.3 10*3/uL (ref 4.0–10.5)
nRBC: 0.3 % — ABNORMAL HIGH (ref 0.0–0.2)

## 2019-09-02 MED ORDER — IPRATROPIUM-ALBUTEROL 0.5-2.5 (3) MG/3ML IN SOLN
3.0000 mL | Freq: Four times a day (QID) | RESPIRATORY_TRACT | Status: DC
  Administered 2019-09-02 (×3): 3 mL via RESPIRATORY_TRACT
  Filled 2019-09-02 (×3): qty 3

## 2019-09-02 MED ORDER — AZITHROMYCIN 250 MG PO TABS: ORAL_TABLET | ORAL | 0 refills | Status: AC

## 2019-09-02 MED ORDER — METHYLPREDNISOLONE SODIUM SUCC 40 MG IJ SOLR
40.0000 mg | Freq: Two times a day (BID) | INTRAMUSCULAR | Status: DC
  Administered 2019-09-02 – 2019-09-03 (×2): 40 mg via INTRAVENOUS
  Filled 2019-09-02 (×2): qty 1

## 2019-09-02 MED ORDER — ALBUTEROL SULFATE HFA 108 (90 BASE) MCG/ACT IN AERS: 2.0000 | INHALATION_SPRAY | Freq: Four times a day (QID) | RESPIRATORY_TRACT | 0 refills | Status: DC | PRN

## 2019-09-02 MED ORDER — PREDNISONE 10 MG (21) PO TBPK: ORAL_TABLET | ORAL | 0 refills | Status: DC

## 2019-09-02 MED ORDER — AMOXICILLIN-POT CLAVULANATE 875-125 MG PO TABS: 1.0000 | ORAL_TABLET | Freq: Two times a day (BID) | ORAL | 0 refills | Status: AC

## 2019-09-02 NOTE — Discharge Summary (Signed)
John [name], is a 73 y.o. male  DOB October 29, 1946  MRN [id number].  Admission date:  09/01/2019  Admitting Physician  Vaughan Basta, MD  Discharge Date:  09/02/2019   Primary MD  Tracie Harrier, MD  Recommendations for primary care physician for things to follow:   Follow-up with PCP in 1 week   Admission Diagnosis  Hypoxia [R09.02] Elevated troponin I level [R79.89] Pulmonary emphysema, unspecified emphysema type ([place]) [J43.9] Community acquired pneumonia, unspecified laterality [J18.9]   Discharge Diagnosis  Hypoxia [R09.02] Elevated troponin I level [R79.89] Pulmonary emphysema, unspecified emphysema type ([place]) [J43.9] Community acquired pneumonia, unspecified laterality [J18.9]    Principal Problem:   Community acquired pneumonia Active Problems:   Pneumonia      Past Medical History:  Diagnosis Date  . Hypertension     Past Surgical History:  Procedure Laterality Date  . INGUINAL HERNIA REPAIR Right 02/04/2017   Procedure: HERNIA REPAIR INGUINAL ADULT;  Surgeon: Leonie Green, MD;  Location: [facility] ORS;  Service: General;  Laterality: Right;  . NO PAST SURGERIES         History of present illness and  Hospital Course:     Kindly see H&P for history of present illness and admission details, please review complete Labs, Consult reports and Test reports for all details in brief  HPI  from the history and physical done on the day of admission 73 year old male patient with history of COPD, essential hypertension admitted because of shortness of breath, wheezing, found to have pneumonia, admitted to hospitalist service.   Hospital Course  #1 .acute respiratory failure secondary to community-acquired pneumonia, patient oxygen saturation 60% at [facility] clinic yesterday, initially placed on 4  L and admitted to hospitalist service, patient symptoms improved, breathing much better, received IV Rocephin, Zithromax in the hospital previous smoker but quit 2 years ago, patient wants to go home, we checked ambulatory oxygen saturation on room air, sats at rest as well on room air and saturations did not go down below 88.  Discharge home with oral antibiotics with Augmentin, azithromycin for total of 5 days each, prednisone taper pack,    Discharge Condition: Stable   Follow UP  Follow-up Information    Tracie Harrier, MD. Schedule an appointment as soon as possible for a visit in 1 week(s).   Specialty: Internal Medicine Contact information: [place] [address] [phone]             Discharge Instructions  and  Discharge Medications      Allergies as of 09/02/2019   No Known Allergies     Medication List    STOP taking these medications   HYDROcodone-acetaminophen 5-325 MG tablet Commonly known as: Norco     TAKE these medications   albuterol 108 (90 Base) MCG/ACT inhaler Commonly known as: VENTOLIN HFA Inhale 2 puffs into the lungs every 6 (six) hours as needed for wheezing or shortness of breath.   amLODipine 5 MG tablet Commonly known as: NORVASC Take 5 mg by mouth daily.   amoxicillin-clavulanate 875-125 MG tablet Commonly known as: Augmentin Take 1 tablet by mouth 2 (two) times daily for 5 days.   azithromycin 250 MG tablet Commonly known as: Zithromax Z-Pak Take 2 tablets (500 mg) on  Day 1,  followed by 1 tablet (250 mg) once daily on Days 2 through 5.   predniSONE 10 MG (21) Tbpk tablet Commonly known as: STERAPRED UNI-PAK 21 TAB Taper 10 mg p.o.  daily         Diet and Activity recommendation: See Discharge Instructions above   Consults obtained -none   Major procedures and Radiology Reports - PLEASE review detailed and final reports for all details, in brief -      Ct Angio  Chest Pe W And/or Wo Contrast  Result Date: 09/01/2019 CLINICAL DATA:  73 year old male with low oxygen saturation. Concern for pulmonary embolism. EXAM: CT ANGIOGRAPHY CHEST WITH CONTRAST TECHNIQUE: Multidetector CT imaging of the chest was performed using the standard protocol during bolus administration of intravenous contrast. Multiplanar CT image reconstructions and MIPs were obtained to evaluate the vascular anatomy. CONTRAST:  69mL OMNIPAQUE IOHEXOL 350 MG/ML SOLN COMPARISON:  Chest radiograph dated 09/01/2019. FINDINGS: Cardiovascular: There is mild cardiomegaly. Multi vessel coronary vascular calcification. Dilated ascending thoracic aorta measure up to 5 cm in greatest diameter. There is mild atherosclerotic calcification the thoracic aorta. Evaluation of the pulmonary arteries is somewhat limited due to respiratory motion artifact and suboptimal visualization of the peripheral branches. No pulmonary artery embolus identified. Mediastinum/Nodes: No hilar or mediastinal adenopathy. The esophagus is grossly unremarkable. There is a 7 mm focus of calcification in the inferior left thyroid lobe. No mediastinal fluid collection. Lungs/Pleura: There is a background of centrilobular emphysema. Small bilateral pleural effusions with associated partial compressive atelectasis of the lower lobes versus pneumonia. Clusters of nodular opacities in the anterior right upper lobe and left lower lobe concerning for an infectious process. Clinical correlation and follow-up to resolution recommended. There is no pneumothorax. The central airways are patent. Upper Abdomen: Subcentimeter right hepatic hypodense focus is not characterized. The visualized upper abdomen is otherwise unremarkable. Musculoskeletal: No chest wall abnormality. No acute or significant osseous findings. Review of the MIP images confirms the above findings. IMPRESSION: 1. No CT evidence of pulmonary embolism. 2. Emphysema with small bilateral pleural  effusions and findings concerning for pneumonia. Clinical correlation and follow-up to resolution recommended. 3. Aneurysmal dilatation of the ascending thoracic aorta measuring up to 5 cm. Ascending thoracic aortic aneurysm. Recommend semi-annual imaging followup by CTA or MRA and referral to cardiothoracic surgery if not already obtained. This recommendation follows 2010 ACCF/AHA/AATS/ACR/ASA/SCA/SCAI/SIR/STS/SVM Guidelines for the Diagnosis and Management of Patients With Thoracic Aortic Disease. Circulation. 2010; 121: M010-U725. Aortic aneurysm NOS (ICD10-I71.9) 4. Aortic Atherosclerosis (ICD10-I70.0) and Emphysema (ICD10-J43.9). Electronically Signed   By: Anner Crete M.D.   On: 09/01/2019 12:44   Dg Chest Port 1 View  Result Date: 09/01/2019 CLINICAL DATA:  Dyspnea, hypoxia EXAM: PORTABLE CHEST 1 VIEW COMPARISON:  None. FINDINGS: Bilateral diffuse mild interstitial thickening. No pleural effusion or pneumothorax. Mild cardiomegaly. No acute osseous abnormality. IMPRESSION: Bilateral mild interstitial thickening which may reflect interstitial infection versus interstitial edema. Electronically Signed   By: Kathreen Devoid   On: 09/01/2019 10:45    Micro Results     Recent Results (from the past 240 hour(s))  SARS Coronavirus 2 [facility] order, Performed in [facility] hospital lab) Nasopharyngeal Nasopharyngeal Swab     Status: None   Collection Time: 09/01/19 10:29 AM   Specimen: Nasopharyngeal Swab  Result Value Ref Range Status   SARS Coronavirus 2 NEGATIVE NEGATIVE Final    Comment: (NOTE) If result is NEGATIVE SARS-CoV-2 target nucleic acids are NOT DETECTED. The SARS-CoV-2 RNA is generally detectable in upper and lower  respiratory specimens during the acute phase of infection. The lowest  concentration of SARS-CoV-2 viral copies this assay can detect is 250  copies / mL. A negative result does  not preclude SARS-CoV-2 infection  and should not be used as the sole basis for  treatment or other  patient management decisions.  A negative result may occur with  improper specimen collection / handling, submission of specimen other  than nasopharyngeal swab, presence of viral mutation(s) within the  areas targeted by this assay, and inadequate number of viral copies  (<250 copies / mL). A negative result must be combined with clinical  observations, patient history, and epidemiological information. If result is POSITIVE SARS-CoV-2 target nucleic acids are DETECTED. The SARS-CoV-2 RNA is generally detectable in upper and lower  respiratory specimens dur ing the acute phase of infection.  Positive  results are indicative of active infection with SARS-CoV-2.  Clinical  correlation with patient history and other diagnostic information is  necessary to determine patient infection status.  Positive results do  not rule out bacterial infection or co-infection with other viruses. If result is PRESUMPTIVE POSTIVE SARS-CoV-2 nucleic acids MAY BE PRESENT.   A presumptive positive result was obtained on the submitted specimen  and confirmed on repeat testing.  While 2019 novel coronavirus  (SARS-CoV-2) nucleic acids may be present in the submitted sample  additional confirmatory testing may be necessary for epidemiological  and / or clinical management purposes  to differentiate between  SARS-CoV-2 and other Sarbecovirus currently known to infect humans.  If clinically indicated additional testing with an alternate test  methodology [phone]) is advised. The SARS-CoV-2 RNA is generally  detectable in upper and lower respiratory sp ecimens during the acute  phase of infection. The expected result is Negative. Fact Sheet for Patients:  StrictlyIdeas.no Fact Sheet for Healthcare Providers: BankingDealers.co.za This test is not yet approved or cleared by the [place] FDA and has been authorized for detection and/or  diagnosis of SARS-CoV-2 by FDA under an Emergency Use Authorization (EUA).  This EUA will remain in effect (meaning this test can be used) for the duration of the COVID-19 declaration under Section 564(b)(1) of the Act, 21 U.S.C. section 360bbb-3(b)(1), unless the authorization is terminated or revoked sooner. Performed at [facility], [address] [place], [place] [address]   Sputum culture     Status: None   Collection Time: 09/01/19  5:43 PM   Specimen: Sputum  Result Value Ref Range Status   Specimen Description SPU EXPECTORATED SPUTUM  Final   Special Requests NONE  Final   Sputum evaluation   Final    THIS SPECIMEN IS ACCEPTABLE FOR SPUTUM CULTURE Performed at [facility], [address] [place], [place] [address]    Report Status 09/01/2019 FINAL  Final  Culture, respiratory     Status: None (Preliminary result)   Collection Time: 09/01/19  5:43 PM   Specimen: Sputum  Result Value Ref Range Status   Specimen Description   Final    SPU EXPECTORATED SPUTUM Performed at [facility], [address] [place], [place] [address]    Special Requests   Final    NONE Reflexed from [id number] Performed at [facility], [address] [place], [place] [address]    Gram Stain   Final    RARE WBC PRESENT,BOTH PMN AND MONONUCLEAR FEW GRAM POSITIVE COCCI IN PAIRS FEW GRAM POSITIVE RODS RARE GRAM NEGATIVE RODS    Culture   Final    CULTURE REINCUBATED FOR BETTER GROWTH Performed at [facility], [place] [address] [place], [place] [address]    Report Status PENDING  Incomplete  Today   Subjective:   [name] today has no headache,no chest abdominal pain,no new weakness tingling or numbness, feels much better wants to go home today.   Objective:   Blood pressure 120/80, pulse 82, temperature 98.6 F (37 C), temperature source Oral, resp. rate 18, height 5\' 10"  (1.778 m), weight 107.5 kg, SpO2 (!) 89  %.   Intake/Output Summary (Last 24 hours) at 09/02/2019 1340 Last data filed at 09/02/2019 0851 Gross per 24 hour  Intake 1704.04 ml  Output 750 ml  Net 954.04 ml    Exam Awake Alert, Oriented x 3, No new F.N deficits, Normal affect [place].AT,PERRAL Supple Neck,No JVD, No cervical lymphadenopathy appriciated.  Symmetrical Chest wall movement, Good air movement bilaterally, CTAB RRR,No Gallops,Rubs or new Murmurs, No Parasternal Heave +ve B.Sounds, Abd Soft, Non tender, No organomegaly appriciated, No rebound -guarding or rigidity. No Cyanosis, Clubbing or edema, No new Rash or bruise  Data Review   CBC w Diff:  Lab Results  Component Value Date   WBC 9.3 09/02/2019   HGB 15.2 09/02/2019   HCT 51.5 09/02/2019   PLT 175 09/02/2019   LYMPHOPCT 10 09/01/2019   MONOPCT 6 09/01/2019   EOSPCT 0 09/01/2019   BASOPCT 0 09/01/2019    CMP:  Lab Results  Component Value Date   NA 141 09/02/2019   K 5.0 09/02/2019   CL 100 09/02/2019   CO2 28 09/02/2019   BUN 14 09/02/2019   CREATININE 1.09 09/02/2019  .   Total Time in preparing paper work, data evaluation and todays exam - 35 minutes  Epifanio Lesches M.D on 09/02/2019 at 1:40 PM    Note: This dictation was prepared with Dragon dictation along with smaller phrase technology. Any transcriptional errors that result from this process are unintentional.

## 2019-09-02 NOTE — Discharge Instructions (Signed)
Community-Acquired Pneumonia, Adult Pneumonia is a type of lung infection that causes swelling in the airways of the lungs. Mucus and fluid may also build up inside the airways. This may cause coughing and difficulty breathing. There are different types of pneumonia. One type can develop while a person is in a hospital. A different type is called community-acquired pneumonia. It develops in people who are not, and have not recently been, in the hospital or another type of health care facility. What are the causes? This condition may be caused by:  Viruses. This is the most common cause of pneumonia.  Bacteria. Community-acquired pneumonia is often caused by Streptococcus pneumoniae bacteria. These bacteria are often passed from one person to another by breathing in droplets from the cough or sneeze of an infected person.  Fungi. This is the least common cause of pneumonia. What increases the risk? The following factors may make you more likely to develop this condition:  Having a chronic disease, such as chronic obstructive pulmonary disease (COPD), asthma, congestive heart failure, cystic fibrosis, diabetes, or kidney disease.  Having early-stage or late-stage HIV.  Having sickle cell disease.  Having had your spleen removed (splenectomy).  Having poor dental hygiene.  Having a medical condition that increases the risk of breathing in (aspirating) secretions from your own mouth and nose.  Having a weakened body defense system (immune system).  Being a smoker.  Traveling to areas where pneumonia-causing germs commonly exist.  Being around animal habitats or animals that have pneumonia-causing germs, including birds, bats, rabbits, cats, and farm animals. What are the signs or symptoms? Symptoms of this condition include:  A dry cough.  A wet (productive) cough.  Fever.  Sweating.  Chest pain, especially when breathing deeply or coughing.  Rapid breathing or difficulty  breathing.  Shortness of breath.  Shaking chills.  Fatigue.  Muscle aches. How is this diagnosed? This condition may be diagnosed based on:  Your medical history.  A physical exam. You may also have tests, including:  Chest X-rays.  Tests of your blood oxygen level and other blood gases.  Tests on blood, mucus (sputum), fluid around your lungs (pleural fluid), and urine. If your pneumonia is severe, other tests may be done to find the exact cause of your illness. How is this treated? Treatment for this condition depends on many factors, such as the cause of your pneumonia, the medicines you take, and other medical conditions that you have. For most adults, treatment and recovery from pneumonia may occur at home. In some cases, treatment must happen in a hospital. Treatment may include:  Medicines that are given by mouth or through an IV, including: ? Antibiotic medicines, if the pneumonia was caused by bacteria. ? Antiviral medicines, if the pneumonia was caused by a virus.  Being given extra oxygen.  Respiratory therapy. Although rare, treating severe pneumonia may include:  Using a machine to help you breathe (mechanical ventilation). This is done if you are not breathing well on your own and you cannot maintain a safe blood oxygen level.  Thoracentesis. This is a procedure to remove fluid from around one lung or both lungs to help you breathe better. Follow these instructions at home:  Medicines  Take over-the-counter and prescription medicines only as told by your health care provider. ? Only take cough medicine if you are losing sleep. Be aware that cough medicine can prevent your body's natural ability to remove mucus from your lungs.  If you were prescribed an antibiotic  medicine, take it as told by your health care provider. Do not stop taking the antibiotic even if you start to feel better. General instructions  Sleep in a semi-upright position at night. Try  sleeping in a reclining chair, or place a few pillows under your head.  Rest as needed and get at least 8 hours of sleep each night.  Drink enough water to keep your urine pale yellow. This will help to thin out mucus secretions in your lungs.  Eat a healthy diet that includes plenty of vegetables, fruits, whole grains, low-fat dairy products, and lean protein.  Do not use any products that contain nicotine or tobacco, such as cigarettes, e-cigarettes, and chewing tobacco. If you need help quitting, ask your health care provider.  Keep all follow-up visits as told by your health care provider. This is important. How is this prevented? You can lower your risk of developing community-acquired pneumonia by:  Getting a pneumococcal vaccine. There are different types and schedules of pneumococcal vaccines. Ask your health care provider which option is best for you. Consider getting the vaccine if: ? You are older than 74 years of age. ? You are older than 74 years of age and are undergoing cancer treatment, have chronic lung disease, or have other medical conditions that affect your immune system. Ask your health care provider if this applies to you.  Getting an influenza vaccine every year. Ask your health care provider which type of vaccine is best for you.  Getting regular checkups from your dentist.  Washing your hands often. If soap and water are not available, use hand sanitizer. Contact a health care provider if:  You have a fever.  You are losing sleep because you cannot control your cough with cough medicine. Get help right away if:  You have worsening shortness of breath.  You have increased chest pain.  Your sickness becomes worse, especially if you are an older adult or have a weakened immune system.  You cough up blood. Summary  Pneumonia is an infection of the lungs.  Community-acquired pneumonia develops in people who have not been in the hospital. It can be caused  by bacteria, viruses, or fungi.  This condition may be treated with antibiotics or antiviral medicines.  Severe cases may require hospitalization, mechanical ventilation, and other procedures to drain fluid from the lungs. This information is not intended to replace advice given to you by your health care provider. Make sure you discuss any questions you have with your health care provider. Document Released: 12/02/2005 Document Revised: 07/30/2018 Document Reviewed: 07/30/2018 Elsevier Patient Education  2020 [employer].

## 2019-09-03 LAB — HIV ANTIBODY (ROUTINE TESTING W REFLEX): HIV Screen 4th Generation wRfx: NONREACTIVE

## 2019-09-03 MED ORDER — IPRATROPIUM-ALBUTEROL 0.5-2.5 (3) MG/3ML IN SOLN
3.0000 mL | Freq: Three times a day (TID) | RESPIRATORY_TRACT | Status: DC
  Administered 2019-09-03: 3 mL via RESPIRATORY_TRACT
  Filled 2019-09-03 (×2): qty 3

## 2019-09-03 NOTE — Progress Notes (Signed)
Patient is not discharge yesterday because O2 saturation dropped to 70% when the nurse were ready to get discharged him yesterday so kept him, discharge canceled, today patient feels good, nurse noticed that his fingers are cold and not picking O2 sats correctly, patient is warmed up little bit, O2 saturation more than 90%, patient stable for discharge, discharge instructions on the computer, continue antibiotics, steroids

## 2019-09-03 NOTE — Discharge Summary (Addendum)
John [name], is a 73 y.o. male  DOB 1946/12/06  MRN [id number].  Admission date:  09/01/2019  Admitting Physician  Vaughan Basta, MD  Discharge Date:  09/03/2019   Primary MD  Tracie Harrier, MD  Recommendations for primary care physician for things to follow:   Follow-up with PCP in 1 week   Admission Diagnosis  Hypoxia [R09.02] Elevated troponin I level [R79.89] Pulmonary emphysema, unspecified emphysema type ([place]) [J43.9] Community acquired pneumonia, unspecified laterality [J18.9]   Discharge Diagnosis  Hypoxia [R09.02] Elevated troponin I level [R79.89] Pulmonary emphysema, unspecified emphysema type ([place]) [J43.9] Community acquired pneumonia, unspecified laterality [J18.9]    Principal Problem:   Community acquired pneumonia Active Problems:   Pneumonia      Past Medical History:  Diagnosis Date  . Hypertension     Past Surgical History:  Procedure Laterality Date  . INGUINAL HERNIA REPAIR Right 02/04/2017   Procedure: HERNIA REPAIR INGUINAL ADULT;  Surgeon: Leonie Green, MD;  Location: [facility] ORS;  Service: General;  Laterality: Right;  . NO PAST SURGERIES         History of present illness and  Hospital Course:     Kindly see H&P for history of present illness and admission details, please review complete Labs, Consult reports and Test reports for all details in brief  HPI  from the history and physical done on the day of admission 73 year old male patient with history of COPD, essential hypertension admitted because of shortness of breath, wheezing, found to have pneumonia, admitted to hospitalist service.   Hospital Course  #1 .acute respiratory failure secondary to community-acquired pneumonia, patient oxygen saturation 60% at [facility] clinic yesterday, initially placed on 4  L and admitted to hospitalist service, patient symptoms improved, breathing much better, received IV Rocephin, Zithromax in the hospital previous smoker but quit 2 years ago, patient wants to go home, we checked ambulatory oxygen saturation on room air, sats at rest as well on room air and saturations did not go down below 88.  Discharge home with oral antibiotics with Augmentin, azithromycin for total of 5 days each, prednisone taper pack, Patient can use albuterol inhaler every 4 hours for 4 days and then resume as needed after that #2 /slightly elevated high-sensitivity troponins, due to demand ischemia, as patient has no chest pain or shortness of breath with normal echocardiogram, patient can follow-up with Dr. Ubaldo Glassing outpatient, epic texted him, will give appointment.  Discharge Condition: Stable   Follow UP  Follow-up Information    Tracie Harrier, MD. Go on 09/09/2019.   Specialty: Internal Medicine Why: appointment time 215 Contact information: [place] [address] [phone]             Discharge Instructions  and  Discharge Medications      Allergies as of 09/03/2019   No Known Allergies     Medication List    STOP taking these medications   HYDROcodone-acetaminophen 5-325 MG tablet Commonly known as: Norco     TAKE these medications   albuterol 108 (90 Base) MCG/ACT inhaler Commonly known as: VENTOLIN HFA Inhale 2 puffs into the lungs every 6 (six) hours as needed for wheezing or shortness of breath.   amLODipine 5 MG tablet Commonly known as: NORVASC Take 5 mg by mouth daily.   amoxicillin-clavulanate 875-125 MG tablet Commonly known as: Augmentin Take 1 tablet by mouth 2 (two) times daily for 5 days.   azithromycin 250 MG tablet Commonly known  as: Zithromax Z-Pak Take 2 tablets (500 mg) on  Day 1,  followed by 1 tablet (250 mg) once daily on Days 2 through 5.   predniSONE 10 MG (21) Tbpk  tablet Commonly known as: STERAPRED UNI-PAK 21 TAB Taper 10 mg p.o. daily         Diet and Activity recommendation: See Discharge Instructions above   Consults obtained -none   Major procedures and Radiology Reports - PLEASE review detailed and final reports for all details, in brief -  Can use albuterol inhaler every 4 hours for next couple of days and use as needed after that.    Ct Angio Chest Pe W And/or Wo Contrast  Result Date: 09/01/2019 CLINICAL DATA:  73 year old male with low oxygen saturation. Concern for pulmonary embolism. EXAM: CT ANGIOGRAPHY CHEST WITH CONTRAST TECHNIQUE: Multidetector CT imaging of the chest was performed using the standard protocol during bolus administration of intravenous contrast. Multiplanar CT image reconstructions and MIPs were obtained to evaluate the vascular anatomy. CONTRAST:  60mL OMNIPAQUE IOHEXOL 350 MG/ML SOLN COMPARISON:  Chest radiograph dated 09/01/2019. FINDINGS: Cardiovascular: There is mild cardiomegaly. Multi vessel coronary vascular calcification. Dilated ascending thoracic aorta measure up to 5 cm in greatest diameter. There is mild atherosclerotic calcification the thoracic aorta. Evaluation of the pulmonary arteries is somewhat limited due to respiratory motion artifact and suboptimal visualization of the peripheral branches. No pulmonary artery embolus identified. Mediastinum/Nodes: No hilar or mediastinal adenopathy. The esophagus is grossly unremarkable. There is a 7 mm focus of calcification in the inferior left thyroid lobe. No mediastinal fluid collection. Lungs/Pleura: There is a background of centrilobular emphysema. Small bilateral pleural effusions with associated partial compressive atelectasis of the lower lobes versus pneumonia. Clusters of nodular opacities in the anterior right upper lobe and left lower lobe concerning for an infectious process. Clinical correlation and follow-up to resolution recommended. There is no  pneumothorax. The central airways are patent. Upper Abdomen: Subcentimeter right hepatic hypodense focus is not characterized. The visualized upper abdomen is otherwise unremarkable. Musculoskeletal: No chest wall abnormality. No acute or significant osseous findings. Review of the MIP images confirms the above findings. IMPRESSION: 1. No CT evidence of pulmonary embolism. 2. Emphysema with small bilateral pleural effusions and findings concerning for pneumonia. Clinical correlation and follow-up to resolution recommended. 3. Aneurysmal dilatation of the ascending thoracic aorta measuring up to 5 cm. Ascending thoracic aortic aneurysm. Recommend semi-annual imaging followup by CTA or MRA and referral to cardiothoracic surgery if not already obtained. This recommendation follows 2010 ACCF/AHA/AATS/ACR/ASA/SCA/SCAI/SIR/STS/SVM Guidelines for the Diagnosis and Management of Patients With Thoracic Aortic Disease. Circulation. 2010; 121: G921-J941. Aortic aneurysm NOS (ICD10-I71.9) 4. Aortic Atherosclerosis (ICD10-I70.0) and Emphysema (ICD10-J43.9). Electronically Signed   By: Anner Crete M.D.   On: 09/01/2019 12:44   Dg Chest Port 1 View  Result Date: 09/01/2019 CLINICAL DATA:  Dyspnea, hypoxia EXAM: PORTABLE CHEST 1 VIEW COMPARISON:  None. FINDINGS: Bilateral diffuse mild interstitial thickening. No pleural effusion or pneumothorax. Mild cardiomegaly. No acute osseous abnormality. IMPRESSION: Bilateral mild interstitial thickening which may reflect interstitial infection versus interstitial edema. Electronically Signed   By: Kathreen Devoid   On: 09/01/2019 10:45    Micro Results     Recent Results (from the past 240 hour(s))  SARS Coronavirus 2 [facility] order, Performed in [facility] hospital lab) Nasopharyngeal Nasopharyngeal Swab     Status: None   Collection Time: 09/01/19 10:29 AM   Specimen: Nasopharyngeal Swab  Result Value Ref Range Status   SARS  Coronavirus 2 NEGATIVE NEGATIVE Final     Comment: (NOTE) If result is NEGATIVE SARS-CoV-2 target nucleic acids are NOT DETECTED. The SARS-CoV-2 RNA is generally detectable in upper and lower  respiratory specimens during the acute phase of infection. The lowest  concentration of SARS-CoV-2 viral copies this assay can detect is 250  copies / mL. A negative result does not preclude SARS-CoV-2 infection  and should not be used as the sole basis for treatment or other  patient management decisions.  A negative result may occur with  improper specimen collection / handling, submission of specimen other  than nasopharyngeal swab, presence of viral mutation(s) within the  areas targeted by this assay, and inadequate number of viral copies  (<250 copies / mL). A negative result must be combined with clinical  observations, patient history, and epidemiological information. If result is POSITIVE SARS-CoV-2 target nucleic acids are DETECTED. The SARS-CoV-2 RNA is generally detectable in upper and lower  respiratory specimens dur ing the acute phase of infection.  Positive  results are indicative of active infection with SARS-CoV-2.  Clinical  correlation with patient history and other diagnostic information is  necessary to determine patient infection status.  Positive results do  not rule out bacterial infection or co-infection with other viruses. If result is PRESUMPTIVE POSTIVE SARS-CoV-2 nucleic acids MAY BE PRESENT.   A presumptive positive result was obtained on the submitted specimen  and confirmed on repeat testing.  While 2019 novel coronavirus  (SARS-CoV-2) nucleic acids may be present in the submitted sample  additional confirmatory testing may be necessary for epidemiological  and / or clinical management purposes  to differentiate between  SARS-CoV-2 and other Sarbecovirus currently known to infect humans.  If clinically indicated additional testing with an alternate test  methodology [phone]) is advised. The SARS-CoV-2  RNA is generally  detectable in upper and lower respiratory sp ecimens during the acute  phase of infection. The expected result is Negative. Fact Sheet for Patients:  StrictlyIdeas.no Fact Sheet for Healthcare Providers: BankingDealers.co.za This test is not yet approved or cleared by the [place] FDA and has been authorized for detection and/or diagnosis of SARS-CoV-2 by FDA under an Emergency Use Authorization (EUA).  This EUA will remain in effect (meaning this test can be used) for the duration of the COVID-19 declaration under Section 564(b)(1) of the Act, 21 U.S.C. section 360bbb-3(b)(1), unless the authorization is terminated or revoked sooner. Performed at [facility], [address] [place], [place] [address]   Sputum culture     Status: None   Collection Time: 09/01/19  5:43 PM   Specimen: Sputum  Result Value Ref Range Status   Specimen Description SPU EXPECTORATED SPUTUM  Final   Special Requests NONE  Final   Sputum evaluation   Final    THIS SPECIMEN IS ACCEPTABLE FOR SPUTUM CULTURE Performed at [facility], [address], [place], [place] [address]    Report Status 09/01/2019 FINAL  Final  Culture, respiratory     Status: None (Preliminary result)   Collection Time: 09/01/19  5:43 PM   Specimen: Sputum  Result Value Ref Range Status   Specimen Description   Final    SPU EXPECTORATED SPUTUM Performed at [facility], [address], [place], [place] [address]    Special Requests   Final    NONE Reflexed from [id number] Performed at [facility], [address] [place], [place] [address]    Gram Stain   Final  RARE WBC PRESENT,BOTH PMN AND MONONUCLEAR FEW GRAM POSITIVE COCCI IN PAIRS FEW GRAM POSITIVE RODS RARE GRAM NEGATIVE RODS    Culture   Final    MODERATE Consistent with normal respiratory flora. Performed at [facility], [place] [address]  [place], [place] [address]    Report Status PENDING  Incomplete       Today   Subjective:   John [name] today has no headache,no chest abdominal pain,no new weakness tingling or numbness, feels much better wants to go home today.   Objective:   Blood pressure (!) 139/94, pulse 68, temperature (!) 97.5 F (36.4 C), temperature source Oral, resp. rate 18, height 5\' 10"  (1.778 m), weight 107.5 kg, SpO2 95 %.   Intake/Output Summary (Last 24 hours) at 09/03/2019 1248 Last data filed at 09/03/2019 0939 Gross per 24 hour  Intake 1765.14 ml  Output 775 ml  Net 990.14 ml    Exam Awake Alert, Oriented x 3, No new F.N deficits, Normal affect .AT,PERRAL Supple Neck,No JVD, No cervical lymphadenopathy appriciated.  Symmetrical Chest wall movement, Good air movement bilaterally, CTAB RRR,No Gallops,Rubs or new Murmurs, No Parasternal Heave +ve B.Sounds, Abd Soft, Non tender, No organomegaly appriciated, No rebound -guarding or rigidity. No Cyanosis, Clubbing or edema, No new Rash or bruise  Data Review   CBC w Diff:  Lab Results  Component Value Date   WBC 9.3 09/02/2019   HGB 15.2 09/02/2019   HCT 51.5 09/02/2019   PLT 175 09/02/2019   LYMPHOPCT 10 09/01/2019   MONOPCT 6 09/01/2019   EOSPCT 0 09/01/2019   BASOPCT 0 09/01/2019    CMP:  Lab Results  Component Value Date   NA 141 09/02/2019   K 5.0 09/02/2019   CL 100 09/02/2019   CO2 28 09/02/2019   BUN 14 09/02/2019   CREATININE 1.09 09/02/2019  .   Total Time in preparing paper work, data evaluation and todays exam - 35 minutes  Epifanio Lesches M.D on 09/03/2019 at 12:48 PM    Note: This dictation was prepared with Dragon dictation along with smaller phrase technology. Any transcriptional errors that result from this process are unintentional.

## 2019-09-03 NOTE — Care Management Important Message (Signed)
Important Message  Patient Details  Name: John [name] MRN: [id number] Date of Birth: July 07, 1946   Medicare Important Message Given:  Yes     [name] 09/03/2019, 11:17 AM

## 2019-09-03 NOTE — Progress Notes (Signed)
John [name] to be D/C'd home per MD order.  Discussed prescriptions and follow up appointments with the patient. Prescriptions given to patient, medication list explained in detail. Pt verbalized understanding.  Allergies as of 09/03/2019   No Known Allergies     Medication List    STOP taking these medications   HYDROcodone-acetaminophen 5-325 MG tablet Commonly known as: Norco     TAKE these medications   albuterol 108 (90 Base) MCG/ACT inhaler Commonly known as: VENTOLIN HFA Inhale 2 puffs into the lungs every 6 (six) hours as needed for wheezing or shortness of breath.   amLODipine 5 MG tablet Commonly known as: NORVASC Take 5 mg by mouth daily.   amoxicillin-clavulanate 875-125 MG tablet Commonly known as: Augmentin Take 1 tablet by mouth 2 (two) times daily for 5 days.   azithromycin 250 MG tablet Commonly known as: Zithromax Z-Pak Take 2 tablets (500 mg) on  Day 1,  followed by 1 tablet (250 mg) once daily on Days 2 through 5.   predniSONE 10 MG (21) Tbpk tablet Commonly known as: STERAPRED UNI-PAK 21 TAB Taper 10 mg p.o. daily       Vitals:   09/03/19 0815 09/03/19 1100  BP: (!) 137/93 (!) 139/94  Pulse: 67 68  Resp: 18 18  Temp: 97.8 F (36.6 C) (!) 97.5 F (36.4 C)  SpO2: 96% 95%    Skin clean, dry and intact without evidence of skin break down, no evidence of skin tears noted. IV catheter discontinued intact. Site without signs and symptoms of complications. Dressing and pressure applied. Pt denies pain at this time. No complaints noted.  An After Visit Summary was printed and given to the patient. Patient escorted via [place], and D/C home via private auto.  Fuller Mandril, RN

## 2019-09-04 LAB — CULTURE, RESPIRATORY W GRAM STAIN: Culture: NORMAL

## 2019-09-09 DIAGNOSIS — Z09 Encounter for follow-up examination after completed treatment for conditions other than malignant neoplasm: Secondary | ICD-10-CM | POA: Diagnosis not present

## 2019-09-09 DIAGNOSIS — I10 Essential (primary) hypertension: Secondary | ICD-10-CM | POA: Diagnosis not present

## 2019-09-09 DIAGNOSIS — R0902 Hypoxemia: Secondary | ICD-10-CM | POA: Diagnosis not present

## 2019-09-09 DIAGNOSIS — F17200 Nicotine dependence, unspecified, uncomplicated: Secondary | ICD-10-CM | POA: Diagnosis not present

## 2019-09-09 DIAGNOSIS — J9611 Chronic respiratory failure with hypoxia: Secondary | ICD-10-CM | POA: Diagnosis not present

## 2019-09-09 DIAGNOSIS — R600 Localized edema: Secondary | ICD-10-CM | POA: Diagnosis not present

## 2019-09-09 DIAGNOSIS — J432 Centrilobular emphysema: Secondary | ICD-10-CM | POA: Diagnosis not present

## 2019-09-09 DIAGNOSIS — I712 Thoracic aortic aneurysm, without rupture: Secondary | ICD-10-CM | POA: Diagnosis not present

## 2019-09-10 ENCOUNTER — Encounter: Payer: Self-pay | Admitting: *Deleted

## 2019-09-10 ENCOUNTER — Other Ambulatory Visit: Payer: Self-pay | Admitting: *Deleted

## 2019-09-10 NOTE — Patient Outreach (Signed)
[place] [facility]) Care Management [facility] Community CM Telephone Outreach, John [name] Red alert- general discharge PCP completes Transition of Care follow up post-hospital discharge Post-hospital discharge day # 7   09/10/2019  John [name] 08-26-1946 [id number]  EMMI- General Discharge RED ON EMMI ALERT from [place] call 09/09/2019 (EMMI day 4)  THN CM call Post-hospital discharge day # 7   Red Alert Reason: "Unfilled prescriptions"   Successful telephone outreach to John [name], 73 y/o male recently hospitalized September 16-18, 2020 for hypoxia, shortness of breath, community acquired pneumonia.  Patient has history including, but not limited to, HTN and COPD.  Confirmed through EMR that patient's PCP completes transition of care outreach post-hospital discharge.  HIPAA/ identity verified with patient during phone call today; patient reluctant to provide HIPAA identifiers and purpose of call was discussed with patient; eventually patient provides personal identifiers, but states that he does not have a long time to talk to me today.  THN CM services were discussed with patient, including THN CM services as a part of his insurance benefit through [place].  Patient states that although he is unable to talk to me for a long time today, he is willing to participate in [facility] CM program, if it is free of charge to him.  Explained to patient that I would have [facility] RN CM dedicated to [facility] follow up with him within the next few weeks and he is agreeable to this.  Patient denies pain, new or previous falls, and he sounds to be in no distress throughout phone call today.  Overall pleasant 15 minute phone call.  Patient further reports: -- EMMI red-alert "is pre-emptive;" states that he was leaving post-hospital discharge PCP office visit yesterday at which time PCP prescribed new medications; patient stated that when he received the EMMI call yesterday after his PCP appointment, he had not yet  picked up these new medications and was on his way to the pharmacy to do so.  States that he has completed courses of all post-hospital discharge medications and has obtained newly prescribed medications from PCP office visit yesterday.  Patient states that he is only on "one" regular medication, in addition to 2 medications prescribed yesterday by PCP, which he hopes will be "short-term" medications only.  Patient reports that he self-manages his medications and he denies concerns/ issues/ problems/ questions around his medications. -- declines medication review today stating time constraints -- denies issues around breathing status, states he "is getting along fine and having no problems since hospital release" -- reports that he is independent in all care and provides name of "John [name]" as "someone who can assist me if I need it." Patient does not clarify whom this person is, but does state she is not related to him or a part of his family  -- SDOH completed for: Transportation: patient continues to drive self to all provider appointments: no concerns identified  Patient denies further issues, concerns, or problems today, and expresses frustration that "people in medicine always ask the same questions," around falls, Advanced Directives, and pain.  Explained to patient that the questions/ our conversation help the [facility] CM team to know how to best care for him, to which patient responded that he doesn't really need care as he cares for himself.  Reiterated support role of THN CM and patient continues to provide consent for [facility] RN CM dedicated to [facility] to contact him in the future.  I provided/ confirmed that patient has my direct phone number should  needs arise prior to [place] dedicated to [facility] contacting him, and he is agreeable.  Patient does not wish to take down phone numbers for [facility] CM main office/ 24-hour nurse advice line, and I explained these numbers would be in letter mailed to him by  me.  Plan:  Patient will take medications as prescribed and will attend all scheduled provider appointments  Patient will promptly notify care providers for any new concerns/ issues/ problems that arise  I will make patient's PCP aware of [facility] Community RN CM involvement in patient's care-- will send barriers letter  I will send secure message to [facility] RN CM dedicated to [facility] for ongoing THN RN CM outreach  [facility] CM Care Plan Problem One     Most Recent Value  Care Plan Problem One  Risk for hospital readmission related to/ as evidenced by recent hospital visit September 16-18, 2020 for CAP  Role Documenting the Problem One  Care Management [place] for Problem One  Active  THN Long Term Goal   Over the next 31 days, patient will not experience hospital readmission, as evidenced by patient reporting and review of EMR during [facility] RN CM outreach  [facility] Long Term Goal Start Date  09/10/19  Interventions for Problem One Long Term Goal  Discussed current clinical condition with patient and confirmed that he does not have current clinical concerns,  reviewed post-hospital discharge instructions and medications with patient and confirmed that he attended post-hospital discharge PCP appointment yesterday,  discussed [facility] CM program with patient and initiated [facility] CM program     Oneta Rack, RN, BSN, [employer] Coordinator [facility] Care Management  [phone]

## 2019-09-17 ENCOUNTER — Other Ambulatory Visit: Payer: Self-pay

## 2019-09-17 NOTE — Patient Outreach (Addendum)
[facility]) Care Management  09/17/2019   John [name] 20-Feb-1946 [id number]  Initial Assessment  Outreach attempt #1 to patient. Spoke with patient who voices that he is doing fairly well. He reports that he went for PCP follow up appt and was started on "fluid pill" that has been having him go to the bathroom a lot. Patient states he I peeing all day long. He denies any s/s of dehydration. RN CM reviewed with him adverse side effects of med Patient states that he has been having bilateral LE edema for the past several weeks and attributes it to his Diabetes. He reports he is elevating affected extremity. He does not have scale in the home. RN CM educated pt. On importance of obtaining a scale ad n monitoring weight. Also instructed on proper weighing technique and s/s of worsening conditions. Patient taking BP med but does not monitor BP int he home. Instructed patient on importance of BP monitoring. Revised with him [employer] catalog benefit which may cover cost of it he will follow up. Patient inquiring about how long he should take med. Advised patient to follow with MD regarding this. He does not go back to see MD until Jan. Patietn reports good appetite and that he is not on any special diet. RN CM educated pt.n importance low salt diet given that he is taking BP med and diuretic. He voiced understanding.    Current Medications:  Current Outpatient Medications  Medication Sig Dispense Refill  . losartan-hydrochlorothiazide (HYZAAR) 50-12.5 MG tablet Take 1 tablet by mouth daily.    Marland Kitchen albuterol (VENTOLIN HFA) 108 (90 Base) MCG/ACT inhaler Inhale 2 puffs into the lungs every 6 (six) hours as needed for wheezing or shortness of breath. 18 g 0  . amLODipine (NORVASC) 5 MG tablet Take 5 mg by mouth daily.    . predniSONE (STERAPRED UNI-PAK 21 TAB) 10 MG (21) TBPK tablet Taper 10 mg p.o. daily 21 tablet 0   No current facility-administered medications for this visit.      Functional Status:  In your present state of health, do you have any difficulty performing the following activities: 09/17/2019 09/10/2019  Hearing? N -  Vision? N -  Difficulty concentrating or making decisions? N N  Walking or climbing stairs? N N  Dressing or bathing? N N  Doing errands, shopping? - Scientist, forensic and eating ? N N  Using the Toilet? N -  In the past six months, have you accidently leaked urine? N -  Do you have problems with loss of bowel control? N -  Managing your Medications? N N  Managing your Finances? N N  Housekeeping or managing your Housekeeping? N N  Some recent data might be hidden    Fall/Depression Screening: Fall Risk  09/10/2019  Falls in the past year? 0  Number falls in past yr: 0  Injury with Fall? 0  Comment N/A- no falls reported over last year   PHQ 2/9 Scores 09/10/2019  PHQ - 2 Score 0    Assessment:  THN CM Care Plan Problem One     Most Recent Value  Care Plan Problem One  Risk for hospital readmission related to/ as evidenced by recent hospital visit September 16-18, 2020 for CAP  Role Documenting the Problem One  Care Management [place] for Problem One  Active  THN Long Term Goal   Over the next 31 days, patient will not experience hospital readmission, as evidenced by  patient reporting and review of EMR during [place] outreach  [facility] Long Term Goal Start Date  09/10/19  Interventions for Problem One Long Term Goal  RN CM reviewed with pt. s/s of worsening condition and when to seek medical attention.  THN CM Short Term Goal #1   Patient will complete all post discharge MD follow up appts within the next 30 days.  THN CM Short Term Goal #1 Start Date  09/17/19  Interventions for Short Term Goal #1  RN CM confirmed with pt. MD appts and that he has reliable transportation to appts.    THN CM Care Plan Problem Two     Most Recent Value  Care Plan Problem Two  Knowledge deficit related to mgmt of HTN as  evidenced by unfamiliarity with info/education.  Role Documenting the Problem Two  Care Management [place] for Problem Two  Active  Interventions for Problem Two Long Term Goal   RN CM educated pt. on HTN mgmt. RN CM instructed pt. on importance of BP and weight monitoring in the home and ways to obtain devices.  THN Long Term Goal  Patient will obtain BP monitor and scale in the home and begin weight and BP monitoring over the next 31 days.  THN Long Term Goal Start Date  09/17/19  [facility] CM Short Term Goal #1   Patient will report adherence to low salt diet at least 75% of the time over the next 30 days.  THN CM Short Term Goal #1 Start Date  09/17/19  Interventions for Short Term Goal #2   RN CM educated pt. on low salt diet and healthy eating options.       Plan:  RN CM will follow up with patient within one month. RN CM provided pt. With RN CM contact info and advised to call for any questions or concerns.  RN CM will route encounter to PCP.   [name], RN,BSN,CCM [place] Management Telephonic Care Management Coordinator Direct Phone: [phone] Toll Free: [phone] Fax: [phone]

## 2019-10-01 ENCOUNTER — Ambulatory Visit: Payer: Self-pay | Admitting: Cardiothoracic Surgery

## 2019-10-08 ENCOUNTER — Other Ambulatory Visit: Payer: Self-pay

## 2019-10-08 NOTE — Patient Outreach (Signed)
[name]) Care Management  10/08/2019  John [name] Jun 18, 1946 [id number]   Telephone Assessment   Outreach attempt #1 to patient. Male answered the phone and reported that patient was not at home. RN CM left contact info for patient to return call.      Plan: RN CM will make outreach attempt to patient within 3-4 business days if no return call from patient.  [name], RN,BSN,CCM [place] Management Telephonic Care Management Coordinator Direct Phone: [phone] Toll Free: [phone] Fax: [phone]

## 2019-10-12 ENCOUNTER — Other Ambulatory Visit: Payer: Self-pay

## 2019-10-12 NOTE — Patient Outreach (Signed)
[place] [facility]) Care Management  10/12/2019  Kelsie Kramp 09-Jan-1946 [id number]   Telephone Assessment    Outreach attempt to patient. Spoke with patient who is just getting off from work. He denies any acute issue or concerns. He states that things have been going fairly well for him.He is glad to be back at work. Patient voices that his job requires him to stand for about 8-10 hrs/day. RN CM discussed with pt. importance of proper foot wear and ways to manage fluid retention. He denies any worsening BLE edema. He is adhering to diuretic regimen and trying to limit salt intake. He voices that he is using the bathroom frequently. He shares that he has not had a chance to get a scale as he has ben working so much and not been to the store. RN CM reiterated to patient the importance of daily weight monitoring given that he is taking diuretic and having some fluid retention. He voiced understanding and will work on trying to get a scale in the home. He denies any RN CM needs or concerns at this time.    Plan: RN CM will make outreach attempt to patient within a month.   [name], RN,BSN,CCM [place] Management Telephonic Care Management Coordinator Direct Phone: [phone] Toll Free: [phone] Fax: [phone]

## 2019-10-12 NOTE — Patient Outreach (Signed)
[facility]) Care Management  10/12/2019  John [name] Mar 27, 1946 [id number]   Telephone Assessment    Outreach attempt #2 to patient. Male answered and reported that patient was at work. She freely shared that patient returned back to work and is working about 10hrs/day starting at [employer]. She reports that he is normally home around 5pm and may possibly be able to be reached on his cell during his lunch break(arouns 12:15pm). Advised that RN CM would attempt to reach patient during those times.     Plan: RN CM will make outreach attempt to patient within 3-4 business days. RN CM will send unsuccessful outreach letter to patient.    [name], RN,BSN,CCM [place] Management Telephonic Care Management Coordinator Direct Phone: [phone] Toll Free: [phone] Fax: [phone]

## 2019-10-13 ENCOUNTER — Ambulatory Visit: Payer: Self-pay

## 2019-10-25 ENCOUNTER — Telehealth: Payer: Self-pay

## 2019-10-25 ENCOUNTER — Encounter: Payer: Self-pay | Admitting: Cardiothoracic Surgery

## 2019-10-25 ENCOUNTER — Ambulatory Visit: Payer: Self-pay | Admitting: Cardiothoracic Surgery

## 2019-10-25 ENCOUNTER — Ambulatory Visit (INDEPENDENT_AMBULATORY_CARE_PROVIDER_SITE_OTHER): Payer: Medicare HMO | Admitting: Cardiothoracic Surgery

## 2019-10-25 ENCOUNTER — Other Ambulatory Visit: Payer: Self-pay

## 2019-10-25 VITALS — BP 148/96 | HR 82 | Temp 97.3°F | Ht 70.0 in | Wt 235.0 lb

## 2019-10-25 DIAGNOSIS — I729 Aneurysm of unspecified site: Secondary | ICD-10-CM

## 2019-10-25 NOTE — Telephone Encounter (Signed)
Referral sent to Dr.Fath. Office visit notes, insurance card, demographics sent.

## 2019-10-25 NOTE — Progress Notes (Signed)
Patient ID: John [name], male   DOB: [birth year], 73 y.o.   MRN: [id number]  Chief Complaint  Patient presents with  . New Patient (Initial Visit)    Thoracic aortic aneurysm,     Referred By Dr. Roque Lias Reason for Referral ascending aortic aneurysm  HPI Location, Quality, Duration, Severity, Timing, Context, Modifying Factors, Associated Signs and Symptoms.  John [name] is a 73 y.o. male.  This patient is a very pleasant 73 year old African-American gentleman who comes in today for follow-up of an ascending aortic aneurysm measuring approximately 5 cm.  He has a long history of tobacco use but quit 3 years ago.  He was admitted to the hospital several weeks ago when he was seen by Dr. Gustavus Bryant in the clinic.  At that time his oxygen saturations were low and he was admitted to the hospital with a diagnosis of community-acquired pneumonia.  After therapy for a few days in the hospital he did much better and was discharged to home and with further follow-up.  At that time he had a CT scan of the chest which revealed a 5 cm ascending aortic aneurysm as well as extensive pneumonic process consistent with pneumonia.  At some point he had an echocardiogram which revealed mild aortic insufficiency but no evidence of aortic stenosis.  He has carried a diagnosis of hypertension for several years.  He states that his blood pressures been under good control.  He stopped smoking several years ago.  He is not diabetic.  He is scheduled to see Dr. Bartholome Bill but that appointment has not yet been made.  He plays tennis 4 days a week.  He does not describe any shortness of breath.  He does not have any chest pain.  He has occasional ankle swelling.   Past Medical History:  Diagnosis Date  . Hypertension     Past Surgical History:  Procedure Laterality Date  . INGUINAL HERNIA REPAIR Right 02/04/2017   Procedure: HERNIA REPAIR INGUINAL ADULT;  Surgeon: Leonie Green, MD;  Location: [facility] ORS;  Service:  General;  Laterality: Right;  . NO PAST SURGERIES      Family History  Problem Relation Age of Onset  . Diabetes Mother     Social History Social History   Tobacco Use  . Smoking status: Former Research scientist (life sciences)  . Smokeless tobacco: Never Used  Substance Use Topics  . Alcohol use: Yes    Comment: occassional  . Drug use: No    No Known Allergies  Current Outpatient Medications  Medication Sig Dispense Refill  . amoxicillin-clavulanate (AUGMENTIN) 875-125 MG tablet TAKE 1 TABLET BY MOUTH TWICE DAILY FOR 5 DAYS    . albuterol (VENTOLIN HFA) 108 (90 Base) MCG/ACT inhaler Inhale 2 puffs into the lungs every 6 (six) hours as needed for wheezing or shortness of breath. 18 g 0  . amLODipine (NORVASC) 5 MG tablet Take 5 mg by mouth daily.    Marland Kitchen losartan-hydrochlorothiazide (HYZAAR) 50-12.5 MG tablet Take 1 tablet by mouth daily.    . TRELEGY ELLIPTA 100-62.5-25 MCG/INH AEPB INHALE 1 PUFF ONCE DAILY     No current facility-administered medications for this visit.       Review of Systems A complete review of systems was asked and was negative except for the following positive findings gum disease, shortness of breath now improved.  Blood pressure (!) 148/96, pulse 82, temperature (!) 97.3 F (36.3 C), temperature source Temporal, height 5\' 10"  (1.778 m), weight 235 lb (106.6 kg), SpO2 Marland Kitchen)  88 %.  Physical Exam CONSTITUTIONAL:  Pleasant, well-developed, well-nourished, and in no acute distress. EYES: Pupils equal and reactive to light, Sclera non-icteric EARS, NOSE, MOUTH AND THROAT:  The oropharynx was clear.  Dentition is poor repair.  Oral mucosa pink and moist. LYMPH NODES:  Lymph nodes in the neck and axillae were normal RESPIRATORY:  Lungs were clear.  Normal respiratory effort without pathologic use of accessory muscles of respiration CARDIOVASCULAR: Heart was regular without murmurs.  There were no carotid bruits. GI: The abdomen was soft, nontender, and nondistended. There were no  palpable masses. There was no hepatosplenomegaly. There were normal bowel sounds in all quadrants. GU:  Rectal deferred.   MUSCULOSKELETAL:  Normal muscle strength and tone.  No clubbing or cyanosis.   SKIN:  There were no pathologic skin lesions.  There were no nodules on palpation. NEUROLOGIC:  Sensation is normal.  Cranial nerves are grossly intact. PSYCH:  Oriented to person, place and time.  Mood and affect are normal.  Data Reviewed CT scan of the chest  I have personally reviewed the patient's imaging, laboratory findings and medical records.    Assessment    I have independently reviewed his chest CT.  There is extensive bilateral pneumonic process with a small right pleural effusion and evidence of COPD.  In addition there is a 5 cm ascending aortic aneurysm.  There is extensive coronary calcification.    Plan    I would like him to follow-up with Dr. Jordan Hawks.  His echocardiogram suggests only mild aortic insufficiency.  I also told him I had like to see him back again in 4 months with a repeat CTA of the chest.  All of his questions were answered.       Nestor Lewandowsky, MD 10/25/2019, 10:21 AM

## 2019-10-25 NOTE — Patient Instructions (Addendum)
Please call our office if you have any questions or concerns.   Referral sent to Dr.Kenneth Fath at [facility]. Someone form their office will call to schedule an appointment.    You may call their office to check on this.  [phone] ask for Cardiology.   We will contact you in 4 months to schedule another CT and follow up appointment with Dr. Genevive Bi.

## 2019-10-26 ENCOUNTER — Telehealth: Payer: Self-pay

## 2019-10-26 NOTE — Telephone Encounter (Signed)
Spoke with [facility] and referral is received and she will call patient to schedule the appointment.

## 2019-10-26 NOTE — Telephone Encounter (Signed)
error 

## 2019-11-03 DIAGNOSIS — Z87891 Personal history of nicotine dependence: Secondary | ICD-10-CM | POA: Diagnosis not present

## 2019-11-03 DIAGNOSIS — Z0000 Encounter for general adult medical examination without abnormal findings: Secondary | ICD-10-CM | POA: Diagnosis not present

## 2019-11-03 DIAGNOSIS — R600 Localized edema: Secondary | ICD-10-CM | POA: Diagnosis not present

## 2019-11-03 DIAGNOSIS — J449 Chronic obstructive pulmonary disease, unspecified: Secondary | ICD-10-CM | POA: Diagnosis not present

## 2019-11-03 DIAGNOSIS — I712 Thoracic aortic aneurysm, without rupture: Secondary | ICD-10-CM | POA: Diagnosis not present

## 2019-11-03 DIAGNOSIS — I2584 Coronary atherosclerosis due to calcified coronary lesion: Secondary | ICD-10-CM | POA: Diagnosis not present

## 2019-11-03 DIAGNOSIS — I10 Essential (primary) hypertension: Secondary | ICD-10-CM | POA: Diagnosis not present

## 2019-11-03 DIAGNOSIS — J9611 Chronic respiratory failure with hypoxia: Secondary | ICD-10-CM | POA: Diagnosis not present

## 2019-11-08 ENCOUNTER — Other Ambulatory Visit: Payer: Self-pay

## 2019-11-08 NOTE — Patient Outreach (Signed)
[place] [facility]) Care Management  11/08/2019  John [name] Jun 08, 1946 [id number]   Telephone Assessment    Outreach attempt #1 to patient. No answer. RN CM left HIPAA compliant voicemail message along with contact info.     Plan: RN CM will will make outreach attempt to patient within 3-4 business days.   [name], RN,BSN,CCM [place] Management Telephonic Care Management Coordinator Direct Phone: [phone] Toll Free: [phone] Fax: [phone]

## 2019-11-10 ENCOUNTER — Telehealth: Payer: Self-pay

## 2019-11-10 NOTE — Telephone Encounter (Signed)
Referral follow up-Patient scheduled with [facility] 11/17/2019 @ 3:30 pm.

## 2019-11-15 ENCOUNTER — Other Ambulatory Visit: Payer: Self-pay

## 2019-11-15 NOTE — Patient Outreach (Signed)
[place] [facility]) Care Management  11/15/2019  John [name] Nov 05, 1946 [id number]   Telephone Assessment    Outreach attempt #2 to patient. Spoke with patient. He denies any acute issues or concerns at present. He voices that he saw PCP on last week. MD has referred patient to see cardiology due to something he "saw on x-ray." Patient states he is unsure but has appt on tomorrow. He denies nay chest pain, SOB or other symptoms. He voices that he continues to have ongoing issues with edema to BLE. PCP is aware and may possibly adjust or have cardiology adjust meds. Patient voices that he is adhering to med regimen and taking meds as prescribed. He continue to go to work and states he is tolerating working just fine. He denies any RN CM needs or concerns at this time.      [facility] CM Care Plan Problem One     Most Recent Value  Care Plan Problem One  Risk for hospital readmission related to/ as evidenced by recent hospital visit September 16-18, 2020 for CAP  Role Documenting the Problem One  Care Management [place] for Problem One  Active  THN Long Term Goal   Over the next 31 days, patient will not experience hospital readmission, as evidenced by patient reporting and review of EMR during [facility] RN CM outreach  [facility] Long Term Goal Start Date  09/10/19  [facility] Long Term Goal Met Date  11/15/19  [facility] CM Short Term Goal #1   Patient will complete all post discharge MD follow up appts within the next 30 days.  THN CM Short Term Goal #1 Start Date  09/17/19  THN CM Short Term Goal #1 Met Date  10/12/19    [facility] CM Care Plan Problem Two     Most Recent Value  Care Plan Problem Two  Knowledge deficit related to mgmt of HTN as evidenced by unfamiliarity with info/education.  Role Documenting the Problem Two  Care Management Telephonic Coordinator  Care Plan for Problem Two  Active  THN Long Term Goal  Patient will obtain BP monitor and scale in the home and begin weight and  BP monitoring over the next 31 days.  THN Long Term Goal Start Date  09/17/19  THN Long Term Goal Met Date  11/15/19  THN CM Short Term Goal #1   Patient will report adherence to low salt diet at least 75% of the time over the next 30 days.  THN CM Short Term Goal #1 Start Date  09/17/19  THN CM Short Term Goal #1 Met Date   11/15/19  THN CM Short Term Goal #2   Patient will report completing cardiology appt for further eval/workup over the next 30 days.  THN CM Short Term Goal #2 Start Date  11/15/19  Interventions for Short Term Goal #2  RN CM discussed improtance of adhering to appt. RNCM assessed for any barriers to appt.  THN CM Short Term Goal #3   Patient will report decrease in BLE swelling over the next 30 days.  THN CM Short Term Goal #3 Start Date  11/15/19  Interventions for Short Term Goal #3  RN CM discussed pharmacolgical and non-pharmacological measures to help. RN CM discussed disease and sx mgmt measures.       Plan: RN CM discussed with patient next outreach within a month. Patient gave verbal consent and in agreement with RN CM follow up timeframe. Patient aware that they may contact RN  CM sooner for any issues or concerns.   [name], RN,BSN,CCM [place] Management Telephonic Care Management Coordinator Direct Phone: [phone] Toll Free: [phone] Fax: [phone]

## 2019-11-17 DIAGNOSIS — Z7689 Persons encountering health services in other specified circumstances: Secondary | ICD-10-CM | POA: Diagnosis not present

## 2019-11-17 DIAGNOSIS — I25118 Atherosclerotic heart disease of native coronary artery with other forms of angina pectoris: Secondary | ICD-10-CM | POA: Diagnosis not present

## 2019-11-17 DIAGNOSIS — I10 Essential (primary) hypertension: Secondary | ICD-10-CM | POA: Diagnosis not present

## 2019-11-17 DIAGNOSIS — E782 Mixed hyperlipidemia: Secondary | ICD-10-CM | POA: Diagnosis not present

## 2019-12-13 DIAGNOSIS — I6523 Occlusion and stenosis of bilateral carotid arteries: Secondary | ICD-10-CM | POA: Diagnosis not present

## 2019-12-13 DIAGNOSIS — I25118 Atherosclerotic heart disease of native coronary artery with other forms of angina pectoris: Secondary | ICD-10-CM | POA: Diagnosis not present

## 2019-12-14 ENCOUNTER — Other Ambulatory Visit: Payer: Self-pay

## 2019-12-14 DIAGNOSIS — R600 Localized edema: Secondary | ICD-10-CM | POA: Diagnosis not present

## 2019-12-14 DIAGNOSIS — R0902 Hypoxemia: Secondary | ICD-10-CM | POA: Diagnosis not present

## 2019-12-14 DIAGNOSIS — Z87891 Personal history of nicotine dependence: Secondary | ICD-10-CM | POA: Diagnosis not present

## 2019-12-14 DIAGNOSIS — J432 Centrilobular emphysema: Secondary | ICD-10-CM | POA: Diagnosis not present

## 2019-12-14 DIAGNOSIS — J9611 Chronic respiratory failure with hypoxia: Secondary | ICD-10-CM | POA: Diagnosis not present

## 2019-12-14 DIAGNOSIS — Z125 Encounter for screening for malignant neoplasm of prostate: Secondary | ICD-10-CM | POA: Diagnosis not present

## 2019-12-14 DIAGNOSIS — Z09 Encounter for follow-up examination after completed treatment for conditions other than malignant neoplasm: Secondary | ICD-10-CM | POA: Diagnosis not present

## 2019-12-14 DIAGNOSIS — R972 Elevated prostate specific antigen [PSA]: Secondary | ICD-10-CM | POA: Diagnosis not present

## 2019-12-14 DIAGNOSIS — I712 Thoracic aortic aneurysm, without rupture: Secondary | ICD-10-CM | POA: Diagnosis not present

## 2019-12-14 DIAGNOSIS — I10 Essential (primary) hypertension: Secondary | ICD-10-CM | POA: Diagnosis not present

## 2019-12-14 NOTE — Patient Outreach (Signed)
[place] [facility]) Care Management  12/14/2019  Zadyn Yardley November 18, 1946 [id number]   Telephone Assessment   Outreach attempt #1 to patient. No answer at present.     Plan: RN CM will make outreach attempt to patient within one month.    [name], RN,BSN,CCM [place] Management Telephonic Care Management Coordinator Direct Phone: [phone] Toll Free: [phone] Fax: [phone]

## 2019-12-17 DIAGNOSIS — J189 Pneumonia, unspecified organism: Secondary | ICD-10-CM

## 2019-12-17 HISTORY — DX: Pneumonia, unspecified organism: J18.9

## 2019-12-21 DIAGNOSIS — M1731 Unilateral post-traumatic osteoarthritis, right knee: Secondary | ICD-10-CM | POA: Diagnosis not present

## 2019-12-21 DIAGNOSIS — M25461 Effusion, right knee: Secondary | ICD-10-CM | POA: Diagnosis not present

## 2019-12-21 DIAGNOSIS — M25561 Pain in right knee: Secondary | ICD-10-CM | POA: Diagnosis not present

## 2020-01-10 DIAGNOSIS — N485 Ulcer of penis: Secondary | ICD-10-CM | POA: Diagnosis not present

## 2020-01-10 DIAGNOSIS — I2510 Atherosclerotic heart disease of native coronary artery without angina pectoris: Secondary | ICD-10-CM | POA: Diagnosis not present

## 2020-01-10 DIAGNOSIS — L0390 Cellulitis, unspecified: Secondary | ICD-10-CM | POA: Diagnosis not present

## 2020-01-10 DIAGNOSIS — R600 Localized edema: Secondary | ICD-10-CM | POA: Diagnosis not present

## 2020-01-10 DIAGNOSIS — Z87891 Personal history of nicotine dependence: Secondary | ICD-10-CM | POA: Diagnosis not present

## 2020-01-10 DIAGNOSIS — I10 Essential (primary) hypertension: Secondary | ICD-10-CM | POA: Diagnosis not present

## 2020-01-10 DIAGNOSIS — J449 Chronic obstructive pulmonary disease, unspecified: Secondary | ICD-10-CM | POA: Diagnosis not present

## 2020-01-10 DIAGNOSIS — Z79899 Other long term (current) drug therapy: Secondary | ICD-10-CM | POA: Diagnosis not present

## 2020-01-11 ENCOUNTER — Other Ambulatory Visit: Payer: Self-pay

## 2020-01-11 NOTE — Patient Outreach (Signed)
[place] [facility]) Care Management  01/11/2020  Marvyn Torrez 1946/04/28 [id number]   Telephone Assessment    Outreach attempt to patient. Busy signal after multiple attempts.      Plan: RN CM will make outreach attempt to patient within the month of February.   [name], RN,BSN,CCM [place] Management Telephonic Care Management Coordinator Direct Phone: [phone] Toll Free: [phone] Fax: [phone]

## 2020-02-08 DIAGNOSIS — I2584 Coronary atherosclerosis due to calcified coronary lesion: Secondary | ICD-10-CM | POA: Diagnosis not present

## 2020-02-08 DIAGNOSIS — J9611 Chronic respiratory failure with hypoxia: Secondary | ICD-10-CM | POA: Diagnosis not present

## 2020-02-08 DIAGNOSIS — Z87891 Personal history of nicotine dependence: Secondary | ICD-10-CM | POA: Diagnosis not present

## 2020-02-08 DIAGNOSIS — J449 Chronic obstructive pulmonary disease, unspecified: Secondary | ICD-10-CM | POA: Diagnosis not present

## 2020-02-08 DIAGNOSIS — I10 Essential (primary) hypertension: Secondary | ICD-10-CM | POA: Diagnosis not present

## 2020-02-08 DIAGNOSIS — Z79899 Other long term (current) drug therapy: Secondary | ICD-10-CM | POA: Diagnosis not present

## 2020-02-08 DIAGNOSIS — R2243 Localized swelling, mass and lump, lower limb, bilateral: Secondary | ICD-10-CM | POA: Diagnosis not present

## 2020-02-08 DIAGNOSIS — I712 Thoracic aortic aneurysm, without rupture: Secondary | ICD-10-CM | POA: Diagnosis not present

## 2020-02-09 ENCOUNTER — Other Ambulatory Visit: Payer: Self-pay

## 2020-02-09 NOTE — Patient Outreach (Signed)
[place] [facility]) Care Management  02/09/2020  John [name] August 13, 1946 [id number]   Telephone Assessment    Outreach attempt to patient. No answer at present.     Plan: RN CM will send unsuccessful outreach letter to patient. RN CM will make outreach attempt to patient within the month of March.    [name], RN,BSN,CCM [place] Management Telephonic Care Management Coordinator Direct Phone: [phone] Toll Free: [phone] Fax: [phone]

## 2020-02-14 ENCOUNTER — Other Ambulatory Visit: Payer: Self-pay

## 2020-02-14 ENCOUNTER — Other Ambulatory Visit: Payer: Self-pay | Admitting: Surgery

## 2020-02-14 ENCOUNTER — Other Ambulatory Visit
Admission: RE | Admit: 2020-02-14 | Discharge: 2020-02-14 | Disposition: A | Discharge status: Home / Self Care | Payer: Medicare HMO | Source: Ambulatory Visit | Attending: Surgery | Admitting: Surgery

## 2020-02-14 DIAGNOSIS — S76112A Strain of left quadriceps muscle, fascia and tendon, initial encounter: Secondary | ICD-10-CM | POA: Diagnosis present

## 2020-02-14 DIAGNOSIS — E785 Hyperlipidemia, unspecified: Secondary | ICD-10-CM | POA: Diagnosis present

## 2020-02-14 DIAGNOSIS — Z6833 Body mass index (BMI) 33.0-33.9, adult: Secondary | ICD-10-CM | POA: Diagnosis not present

## 2020-02-14 DIAGNOSIS — E669 Obesity, unspecified: Secondary | ICD-10-CM | POA: Diagnosis present

## 2020-02-14 DIAGNOSIS — J9611 Chronic respiratory failure with hypoxia: Secondary | ICD-10-CM | POA: Diagnosis present

## 2020-02-14 DIAGNOSIS — I10 Essential (primary) hypertension: Secondary | ICD-10-CM | POA: Diagnosis present

## 2020-02-14 DIAGNOSIS — Z20822 Contact with and (suspected) exposure to covid-19: Secondary | ICD-10-CM | POA: Diagnosis present

## 2020-02-14 DIAGNOSIS — W010XXA Fall on same level from slipping, tripping and stumbling without subsequent striking against object, initial encounter: Secondary | ICD-10-CM | POA: Diagnosis not present

## 2020-02-14 DIAGNOSIS — M25562 Pain in left knee: Secondary | ICD-10-CM | POA: Diagnosis present

## 2020-02-14 DIAGNOSIS — J449 Chronic obstructive pulmonary disease, unspecified: Secondary | ICD-10-CM | POA: Diagnosis present

## 2020-02-14 DIAGNOSIS — M1712 Unilateral primary osteoarthritis, left knee: Secondary | ICD-10-CM | POA: Diagnosis not present

## 2020-02-14 DIAGNOSIS — M76892 Other specified enthesopathies of left lower limb, excluding foot: Secondary | ICD-10-CM | POA: Diagnosis not present

## 2020-02-14 DIAGNOSIS — M25462 Effusion, left knee: Secondary | ICD-10-CM | POA: Diagnosis not present

## 2020-02-14 DIAGNOSIS — R0602 Shortness of breath: Secondary | ICD-10-CM | POA: Diagnosis not present

## 2020-02-14 DIAGNOSIS — W1830XA Fall on same level, unspecified, initial encounter: Secondary | ICD-10-CM | POA: Diagnosis present

## 2020-02-14 DIAGNOSIS — Z87891 Personal history of nicotine dependence: Secondary | ICD-10-CM | POA: Diagnosis not present

## 2020-02-14 DIAGNOSIS — Z79899 Other long term (current) drug therapy: Secondary | ICD-10-CM | POA: Diagnosis not present

## 2020-02-15 ENCOUNTER — Encounter
Admission: AD | Disposition: A | Discharge status: Home / Self Care | Payer: Self-pay | Source: Home / Self Care | Attending: Surgery

## 2020-02-15 ENCOUNTER — Inpatient Hospital Stay
Admission: AD | Admit: 2020-02-15 | Discharge: 2020-02-17 | DRG: 501 | Disposition: A | Discharge status: Home Health | Payer: Medicare HMO | Attending: Surgery | Admitting: Surgery

## 2020-02-15 ENCOUNTER — Encounter: Payer: Self-pay | Admitting: Surgery

## 2020-02-15 ENCOUNTER — Ambulatory Visit: Payer: Medicare HMO | Admitting: Anesthesiology

## 2020-02-15 ENCOUNTER — Other Ambulatory Visit: Payer: Self-pay

## 2020-02-15 DIAGNOSIS — R0602 Shortness of breath: Secondary | ICD-10-CM

## 2020-02-15 DIAGNOSIS — Z79899 Other long term (current) drug therapy: Secondary | ICD-10-CM

## 2020-02-15 DIAGNOSIS — Z87891 Personal history of nicotine dependence: Secondary | ICD-10-CM

## 2020-02-15 DIAGNOSIS — S76112A Strain of left quadriceps muscle, fascia and tendon, initial encounter: Principal | ICD-10-CM | POA: Diagnosis present

## 2020-02-15 DIAGNOSIS — J449 Chronic obstructive pulmonary disease, unspecified: Secondary | ICD-10-CM

## 2020-02-15 DIAGNOSIS — Z6833 Body mass index (BMI) 33.0-33.9, adult: Secondary | ICD-10-CM

## 2020-02-15 DIAGNOSIS — J9611 Chronic respiratory failure with hypoxia: Secondary | ICD-10-CM | POA: Diagnosis present

## 2020-02-15 DIAGNOSIS — Z20822 Contact with and (suspected) exposure to covid-19: Secondary | ICD-10-CM | POA: Diagnosis present

## 2020-02-15 DIAGNOSIS — I10 Essential (primary) hypertension: Secondary | ICD-10-CM | POA: Diagnosis present

## 2020-02-15 DIAGNOSIS — W1830XA Fall on same level, unspecified, initial encounter: Secondary | ICD-10-CM | POA: Diagnosis present

## 2020-02-15 DIAGNOSIS — E669 Obesity, unspecified: Secondary | ICD-10-CM | POA: Diagnosis present

## 2020-02-15 DIAGNOSIS — Z419 Encounter for procedure for purposes other than remedying health state, unspecified: Secondary | ICD-10-CM

## 2020-02-15 DIAGNOSIS — E785 Hyperlipidemia, unspecified: Secondary | ICD-10-CM | POA: Diagnosis present

## 2020-02-15 HISTORY — PX: QUADRICEPS TENDON REPAIR: SHX756

## 2020-02-15 LAB — SARS CORONAVIRUS 2 (TAT 6-24 HRS): SARS Coronavirus 2: NEGATIVE

## 2020-02-15 LAB — POCT I-STAT, CHEM 8
BUN: 13 mg/dL (ref 8–23)
Calcium, Ion: 1.18 mmol/L (ref 1.15–1.40)
Chloride: 102 mmol/L (ref 98–111)
Creatinine, Ser: 1.3 mg/dL — ABNORMAL HIGH (ref 0.61–1.24)
Glucose, Bld: 121 mg/dL — ABNORMAL HIGH (ref 70–99)
HCT: 47 % (ref 39.0–52.0)
Hemoglobin: 16 g/dL (ref 13.0–17.0)
Potassium: 3.8 mmol/L (ref 3.5–5.1)
Sodium: 141 mmol/L (ref 135–145)
TCO2: 31 mmol/L (ref 22–32)

## 2020-02-15 SURGERY — REPAIR, TENDON, QUADRICEPS
Anesthesia: General | Site: Knee | Laterality: Left

## 2020-02-15 MED ORDER — LIDOCAINE HCL (CARDIAC) PF 100 MG/5ML IV SOSY
PREFILLED_SYRINGE | INTRAVENOUS | Status: DC | PRN
  Administered 2020-02-15: 100 mg via INTRAVENOUS

## 2020-02-15 MED ORDER — DEXAMETHASONE SODIUM PHOSPHATE 10 MG/ML IJ SOLN
INTRAMUSCULAR | Status: DC | PRN
  Administered 2020-02-15: 5 mg via INTRAVENOUS

## 2020-02-15 MED ORDER — ONDANSETRON HCL 4 MG/2ML IJ SOLN: 4.0000 mg | Freq: Four times a day (QID) | INTRAMUSCULAR | Status: DC | PRN

## 2020-02-15 MED ORDER — FENTANYL CITRATE (PF) 100 MCG/2ML IJ SOLN
INTRAMUSCULAR | Status: AC
  Filled 2020-02-15: qty 2

## 2020-02-15 MED ORDER — SUCCINYLCHOLINE CHLORIDE 20 MG/ML IJ SOLN
INTRAMUSCULAR | Status: DC | PRN
  Administered 2020-02-15: 120 mg via INTRAVENOUS

## 2020-02-15 MED ORDER — CHLORHEXIDINE GLUCONATE 4 % EX LIQD: 60.0000 mL | Freq: Once | CUTANEOUS | Status: DC

## 2020-02-15 MED ORDER — MAGNESIUM HYDROXIDE 400 MG/5ML PO SUSP: 30.0000 mL | Freq: Every day | ORAL | Status: DC | PRN

## 2020-02-15 MED ORDER — OXYCODONE HCL 5 MG/5ML PO SOLN: 5.0000 mg | Freq: Once | ORAL | Status: AC | PRN

## 2020-02-15 MED ORDER — FAMOTIDINE 20 MG PO TABS: 20.0000 mg | ORAL_TABLET | Freq: Once | ORAL | Status: AC

## 2020-02-15 MED ORDER — PROPOFOL 10 MG/ML IV BOLUS
INTRAVENOUS | Status: DC | PRN
  Administered 2020-02-15: 130 mg via INTRAVENOUS
  Administered 2020-02-15: 40 mg via INTRAVENOUS
  Administered 2020-02-15: 50 mg via INTRAVENOUS

## 2020-02-15 MED ORDER — ROPIVACAINE HCL 5 MG/ML IJ SOLN
INTRAMUSCULAR | Status: AC
  Filled 2020-02-15: qty 30

## 2020-02-15 MED ORDER — GLYCOPYRROLATE 0.2 MG/ML IJ SOLN
INTRAMUSCULAR | Status: DC | PRN
  Administered 2020-02-15: .2 mg via INTRAVENOUS

## 2020-02-15 MED ORDER — PROPOFOL 10 MG/ML IV BOLUS
INTRAVENOUS | Status: AC
  Filled 2020-02-15: qty 40

## 2020-02-15 MED ORDER — LIDOCAINE HCL (PF) 1 % IJ SOLN
INTRAMUSCULAR | Status: AC
  Filled 2020-02-15: qty 5

## 2020-02-15 MED ORDER — LIDOCAINE HCL (PF) 2 % IJ SOLN
INTRAMUSCULAR | Status: AC
  Filled 2020-02-15: qty 5

## 2020-02-15 MED ORDER — METOCLOPRAMIDE HCL 10 MG PO TABS: 5.0000 mg | ORAL_TABLET | Freq: Three times a day (TID) | ORAL | Status: DC | PRN

## 2020-02-15 MED ORDER — NALOXONE HCL 0.4 MG/ML IJ SOLN
INTRAMUSCULAR | Status: DC | PRN
  Administered 2020-02-15: .2 mg via INTRAVENOUS

## 2020-02-15 MED ORDER — ONDANSETRON HCL 4 MG/2ML IJ SOLN
INTRAMUSCULAR | Status: DC | PRN
  Administered 2020-02-15: 4 mg via INTRAVENOUS

## 2020-02-15 MED ORDER — ACETAMINOPHEN 10 MG/ML IV SOLN
INTRAVENOUS | Status: AC
  Filled 2020-02-15: qty 100

## 2020-02-15 MED ORDER — ROCURONIUM BROMIDE 100 MG/10ML IV SOLN
INTRAVENOUS | Status: DC | PRN
  Administered 2020-02-15: 10 mg via INTRAVENOUS

## 2020-02-15 MED ORDER — BUPIVACAINE HCL (PF) 0.5 % IJ SOLN
INTRAMUSCULAR | Status: AC
  Filled 2020-02-15: qty 30

## 2020-02-15 MED ORDER — FENTANYL CITRATE (PF) 100 MCG/2ML IJ SOLN
INTRAMUSCULAR | Status: DC | PRN
  Administered 2020-02-15 (×2): 25 ug via INTRAVENOUS
  Administered 2020-02-15: 50 ug via INTRAVENOUS

## 2020-02-15 MED ORDER — BUPIVACAINE-EPINEPHRINE (PF) 0.5% -1:200000 IJ SOLN
INTRAMUSCULAR | Status: AC
  Filled 2020-02-15: qty 30

## 2020-02-15 MED ORDER — TRAMADOL HCL 50 MG PO TABS: 50.0000 mg | ORAL_TABLET | Freq: Four times a day (QID) | ORAL | Status: DC | PRN

## 2020-02-15 MED ORDER — FENTANYL CITRATE (PF) 100 MCG/2ML IJ SOLN: 25.0000 ug | INTRAMUSCULAR | Status: DC | PRN

## 2020-02-15 MED ORDER — CEFAZOLIN SODIUM-DEXTROSE 2-4 GM/100ML-% IV SOLN
INTRAVENOUS | Status: AC
  Filled 2020-02-15: qty 100

## 2020-02-15 MED ORDER — METOCLOPRAMIDE HCL 5 MG/ML IJ SOLN: 5.0000 mg | Freq: Three times a day (TID) | INTRAMUSCULAR | Status: DC | PRN

## 2020-02-15 MED ORDER — ROCURONIUM BROMIDE 50 MG/5ML IV SOLN
INTRAVENOUS | Status: AC
  Filled 2020-02-15: qty 1

## 2020-02-15 MED ORDER — DOCUSATE SODIUM 100 MG PO CAPS
100.0000 mg | ORAL_CAPSULE | Freq: Two times a day (BID) | ORAL | Status: DC
  Administered 2020-02-16 – 2020-02-17 (×2): 100 mg via ORAL
  Filled 2020-02-15 (×3): qty 1

## 2020-02-15 MED ORDER — ONDANSETRON HCL 4 MG PO TABS: 4.0000 mg | ORAL_TABLET | Freq: Four times a day (QID) | ORAL | Status: DC | PRN

## 2020-02-15 MED ORDER — LABETALOL HCL 5 MG/ML IV SOLN: 5.0000 mg | Freq: Once | INTRAVENOUS | Status: AC

## 2020-02-15 MED ORDER — BUPIVACAINE LIPOSOME 1.3 % IJ SUSP
INTRAMUSCULAR | Status: AC
  Filled 2020-02-15: qty 20

## 2020-02-15 MED ORDER — OXYCODONE HCL 5 MG PO TABS
5.0000 mg | ORAL_TABLET | Freq: Once | ORAL | Status: AC | PRN
  Administered 2020-02-15: 5 mg via ORAL

## 2020-02-15 MED ORDER — CEFAZOLIN SODIUM-DEXTROSE 2-4 GM/100ML-% IV SOLN
2.0000 g | INTRAVENOUS | Status: AC
  Administered 2020-02-15: 2 g via INTRAVENOUS

## 2020-02-15 MED ORDER — IPRATROPIUM-ALBUTEROL 0.5-2.5 (3) MG/3ML IN SOLN: 3.0000 mL | Freq: Once | RESPIRATORY_TRACT | Status: AC

## 2020-02-15 MED ORDER — ENOXAPARIN SODIUM 40 MG/0.4ML ~~LOC~~ SOLN
40.0000 mg | SUBCUTANEOUS | Status: DC
  Administered 2020-02-16 – 2020-02-17 (×2): 40 mg via SUBCUTANEOUS
  Filled 2020-02-15 (×2): qty 0.4

## 2020-02-15 MED ORDER — LABETALOL HCL 5 MG/ML IV SOLN: 10.0000 mg | Freq: Once | INTRAVENOUS | Status: DC

## 2020-02-15 MED ORDER — SUGAMMADEX SODIUM 200 MG/2ML IV SOLN
INTRAVENOUS | Status: DC | PRN
  Administered 2020-02-15: 200 mg via INTRAVENOUS

## 2020-02-15 MED ORDER — OXYCODONE HCL 5 MG PO TABS
5.0000 mg | ORAL_TABLET | ORAL | Status: DC | PRN
  Administered 2020-02-16: 10 mg via ORAL
  Filled 2020-02-15 (×2): qty 2

## 2020-02-15 MED ORDER — LACTATED RINGERS IV SOLN: INTRAVENOUS | Status: DC

## 2020-02-15 MED ORDER — SUCCINYLCHOLINE CHLORIDE 20 MG/ML IJ SOLN
INTRAMUSCULAR | Status: AC
  Filled 2020-02-15: qty 1

## 2020-02-15 MED ORDER — IPRATROPIUM-ALBUTEROL 0.5-2.5 (3) MG/3ML IN SOLN: 3.0000 mL | RESPIRATORY_TRACT | Status: DC

## 2020-02-15 MED ORDER — OXYCODONE HCL 5 MG PO TABS
ORAL_TABLET | ORAL | Status: AC
  Filled 2020-02-15: qty 1

## 2020-02-15 MED ORDER — FAMOTIDINE 20 MG PO TABS
ORAL_TABLET | ORAL | Status: AC
  Administered 2020-02-15: 20 mg via ORAL
  Filled 2020-02-15: qty 1

## 2020-02-15 MED ORDER — POTASSIUM CHLORIDE IN NACL 20-0.9 MEQ/L-% IV SOLN
INTRAVENOUS | Status: DC
  Filled 2020-02-15: qty 1000

## 2020-02-15 MED ORDER — ALBUTEROL SULFATE HFA 108 (90 BASE) MCG/ACT IN AERS
INHALATION_SPRAY | RESPIRATORY_TRACT | Status: DC | PRN
  Administered 2020-02-15: 6 via RESPIRATORY_TRACT

## 2020-02-15 MED ORDER — ESMOLOL HCL 100 MG/10ML IV SOLN
INTRAVENOUS | Status: DC | PRN
  Administered 2020-02-15 (×2): 10 mg via INTRAVENOUS
  Administered 2020-02-15: 20 mg via INTRAVENOUS

## 2020-02-15 MED ORDER — METOPROLOL TARTRATE 25 MG PO TABS
25.0000 mg | ORAL_TABLET | Freq: Two times a day (BID) | ORAL | Status: DC
  Administered 2020-02-15 – 2020-02-17 (×3): 25 mg via ORAL
  Filled 2020-02-15 (×3): qty 1

## 2020-02-15 MED ORDER — DIPHENHYDRAMINE HCL 12.5 MG/5ML PO ELIX: 12.5000 mg | ORAL_SOLUTION | ORAL | Status: DC | PRN

## 2020-02-15 MED ORDER — ACETAMINOPHEN 10 MG/ML IV SOLN
INTRAVENOUS | Status: DC | PRN
  Administered 2020-02-15: 1000 mg via INTRAVENOUS

## 2020-02-15 MED ORDER — NALOXONE HCL 0.4 MG/ML IJ SOLN
INTRAMUSCULAR | Status: AC
  Filled 2020-02-15: qty 1

## 2020-02-15 MED ORDER — BISACODYL 10 MG RE SUPP: 10.0000 mg | Freq: Every day | RECTAL | Status: DC | PRN

## 2020-02-15 MED ORDER — ESMOLOL HCL 100 MG/10ML IV SOLN
INTRAVENOUS | Status: AC
  Filled 2020-02-15: qty 10

## 2020-02-15 MED ORDER — LABETALOL HCL 5 MG/ML IV SOLN
INTRAVENOUS | Status: AC
  Administered 2020-02-15: 14:00:00 5 mg via INTRAVENOUS
  Filled 2020-02-15: qty 4

## 2020-02-15 MED ORDER — SODIUM CHLORIDE FLUSH 0.9 % IV SOLN
INTRAVENOUS | Status: AC
  Filled 2020-02-15: qty 20

## 2020-02-15 MED ORDER — BUPIVACAINE-EPINEPHRINE 0.5% -1:200000 IJ SOLN
INTRAMUSCULAR | Status: DC | PRN
  Administered 2020-02-15: 30 mL

## 2020-02-15 MED ORDER — POTASSIUM CHLORIDE IN NACL 20-0.9 MEQ/L-% IV SOLN: INTRAVENOUS | Status: DC

## 2020-02-15 MED ORDER — FLEET ENEMA 7-19 GM/118ML RE ENEM: 1.0000 | ENEMA | Freq: Once | RECTAL | Status: DC | PRN

## 2020-02-15 MED ORDER — FENTANYL CITRATE (PF) 100 MCG/2ML IJ SOLN: 50.0000 ug | Freq: Once | INTRAMUSCULAR | Status: DC

## 2020-02-15 MED ORDER — PHENYLEPHRINE HCL (PRESSORS) 10 MG/ML IV SOLN
INTRAVENOUS | Status: DC | PRN
  Administered 2020-02-15: 100 ug via INTRAVENOUS

## 2020-02-15 MED ORDER — FENTANYL CITRATE (PF) 100 MCG/2ML IJ SOLN
INTRAMUSCULAR | Status: AC
  Administered 2020-02-15: 15:00:00 25 ug via INTRAVENOUS
  Filled 2020-02-15: qty 2

## 2020-02-15 MED ORDER — SODIUM CHLORIDE 0.9 % IV SOLN
INTRAVENOUS | Status: DC | PRN
  Administered 2020-02-15: 40 mL

## 2020-02-15 MED ORDER — IPRATROPIUM-ALBUTEROL 0.5-2.5 (3) MG/3ML IN SOLN
RESPIRATORY_TRACT | Status: AC
  Administered 2020-02-15: 3 mL via RESPIRATORY_TRACT
  Filled 2020-02-15: qty 3

## 2020-02-15 SURGICAL SUPPLY — 56 items
ANCHOR SUT 5.0 TWINFIX 2 ULBRD (Anchor) ×6 IMPLANT
BIT DRILL 4X4.5 FOOTPRINT STR (BIT) IMPLANT
BLADE SURG SZ10 CARB STEEL (BLADE) ×6 IMPLANT
BNDG COHESIVE 4X5 TAN STRL (GAUZE/BANDAGES/DRESSINGS) ×3 IMPLANT
BNDG ESMARK 6X12 TAN STRL LF (GAUZE/BANDAGES/DRESSINGS) ×5 IMPLANT
CANISTER SUCT 1200ML W/VALVE (MISCELLANEOUS) ×3 IMPLANT
CHLORAPREP W/TINT 26 (MISCELLANEOUS) ×3 IMPLANT
COVER WAND RF STERILE (DRAPES) ×3 IMPLANT
CUFF TOURN 24 STER (MISCELLANEOUS) IMPLANT
CUFF TOURN 30 STER DUAL PORT (MISCELLANEOUS) IMPLANT
CUFF TOURN SGL QUICK 34 (TOURNIQUET CUFF) ×2
CUFF TRNQT CYL 34X4.125X (TOURNIQUET CUFF) IMPLANT
DRAPE 3/4 80X56 (DRAPES) ×3 IMPLANT
DRAPE INCISE IOBAN 66X45 STRL (DRAPES) ×3 IMPLANT
DRAPE SPLIT 6X30 W/TAPE (DRAPES) ×6 IMPLANT
DRAPE SURG 17X11 SM STRL (DRAPES) ×6 IMPLANT
DRILL 4X4.5 FOOTPRINT STR (BIT) ×3
ELECT REM PT RETURN 9FT ADLT (ELECTROSURGICAL) ×3
ELECTRODE REM PT RTRN 9FT ADLT (ELECTROSURGICAL) ×1 IMPLANT
GAUZE SPONGE 4X4 12PLY STRL (GAUZE/BANDAGES/DRESSINGS) ×3 IMPLANT
GAUZE XEROFORM 1X8 LF (GAUZE/BANDAGES/DRESSINGS) ×3 IMPLANT
GLOVE BIO SURGEON STRL SZ8 (GLOVE) ×8 IMPLANT
GLOVE INDICATOR 8.0 STRL GRN (GLOVE) ×3 IMPLANT
GOWN STRL REUS W/ TWL LRG LVL3 (GOWN DISPOSABLE) ×2 IMPLANT
GOWN STRL REUS W/ TWL XL LVL3 (GOWN DISPOSABLE) ×1 IMPLANT
GOWN STRL REUS W/TWL LRG LVL3 (GOWN DISPOSABLE) ×4
GOWN STRL REUS W/TWL XL LVL3 (GOWN DISPOSABLE) ×2
HANDLE YANKAUER SUCT BULB TIP (MISCELLANEOUS) ×3 IMPLANT
IMMBOLIZER KNEE 19 BLUE UNIV (SOFTGOODS) ×3 IMPLANT
KIT TURNOVER KIT A (KITS) ×3 IMPLANT
NDL MAYO CATGUT SZ 2 (NEEDLE) IMPLANT
NDL MAYO CATGUT SZ1 (NEEDLE) ×3 IMPLANT
NDL SPNL 20GX3.5 QUINCKE YW (NEEDLE) IMPLANT
NEEDLE MAYO CATGUT SZ 1.5 (NEEDLE) ×3
NEEDLE MAYO CATGUT SZ 2 (NEEDLE) ×1 IMPLANT
NEEDLE MAYO CATGUT SZ1 (NEEDLE) ×1 IMPLANT
NEEDLE SPNL 20GX3.5 QUINCKE YW (NEEDLE) ×3 IMPLANT
NS IRRIG 1000ML POUR BTL (IV SOLUTION) ×3 IMPLANT
PACK EXTREMITY ARMC (MISCELLANEOUS) ×3 IMPLANT
PAD CAST CTTN 4X4 STRL (SOFTGOODS) ×1 IMPLANT
PADDING CAST COTTON 4X4 STRL (SOFTGOODS) ×2
RETRIEVER SUT HEWSON (MISCELLANEOUS) ×3 IMPLANT
SPONGE LAP 18X18 RF (DISPOSABLE) ×3 IMPLANT
STAPLER SKIN PROX 35W (STAPLE) ×3 IMPLANT
STOCKINETTE IMPERVIOUS 9X36 MD (GAUZE/BANDAGES/DRESSINGS) ×3 IMPLANT
SUT ETHIBOND #5 BRAIDED 30INL (SUTURE) ×1 IMPLANT
SUT FIBERWIRE #2 38 BLUE 1/2 (SUTURE)
SUT VIC AB 0 CT1 36 (SUTURE) ×6 IMPLANT
SUT VIC AB 2-0 CT1 27 (SUTURE) ×4
SUT VIC AB 2-0 CT1 TAPERPNT 27 (SUTURE) ×2 IMPLANT
SUT VIC AB 2-0 CT2 27 (SUTURE) ×6 IMPLANT
SUT VIC AB 3-0 SH 27 (SUTURE)
SUT VIC AB 3-0 SH 27X BRD (SUTURE) ×2 IMPLANT
SUTURE FIBERWR #2 38 BLUE 1/2 (SUTURE) IMPLANT
SYR 30ML LL (SYRINGE) ×4 IMPLANT
WIRE MAGNUM (SUTURE) ×2 IMPLANT

## 2020-02-15 NOTE — Anesthesia Procedure Notes (Signed)
Procedure Name: LMA Insertion Date/Time: 02/15/2020 11:47 AM Performed by: Lowry Bowl, CRNA Pre-anesthesia Checklist: Patient identified, Emergency Drugs available, Suction available, Patient being monitored and Timeout performed Patient Re-evaluated:Patient Re-evaluated prior to induction Oxygen Delivery Method: Circle system utilized Preoxygenation: Pre-oxygenation with 100% oxygen Induction Type: IV induction LMA: LMA inserted LMA Size: 5.0 Number of attempts: 1 Placement Confirmation: positive ETCO2 and breath sounds checked- equal and bilateral Tube secured with: Tape Dental Injury: Teeth and Oropharynx as per pre-operative assessment

## 2020-02-15 NOTE — Op Note (Signed)
02/15/2020  1:15 PM  Patient:   John [name]  Pre-Op Diagnosis:   Acute quadriceps tendon rupture, left knee.  Post-Op Diagnosis:   Same.  Procedure:   Primary repair of left quadriceps tendon rupture.  Surgeon:   Pascal Lux, MD  Assistant:   Cameron Proud, PA-C  Anesthesia:   General LMA --> GET  Findings:   As above.  Complications:   None  Fluids:   900 cc crystalloid  EBL:   25 cc  TT:   75 minutes at 300 mmHg  Drains:   None  Closure:   Staples  Implants:   [employer] Nephew 5 mm titanium corkscrew anchors x3  Brief Clinical Note:   The patient is a 74 year old male who sustained the above-noted injury 3 days ago. He presented to the office yesterday where his history and examination were consistent with a quadriceps tendon rupture, confirmed by ultrasound. The patient presents at this time for definitive management of this injury.  Procedure:   The patient was brought into the operating room and lain in the supine position. After adequate general laryngeal mask anesthesia was obtained, the patient's left lower extremity was prepped with ChloraPrep solution before being draped sterilely. Preoperative antibiotics were administered.  During the case, the patient's LMA did not appear to be functioning properly, so he was converted to general endotracheal intubation and anesthesia.  A timeout was performed to verify the appropriate surgical site before the limb was exsanguinated with an Esmarch and the thigh tourniquet inflated to 300 mmHg.   An approximately 4-5 inch incision was made over the anterior aspect of the knee centered at the superior pole of the patella. The incision was carried down through the subcutaneous tissues to expose the superficial retinaculum. This was split the length of the incision to reveal the ruptured quadriceps tendon. The torn end of the tendon was debrided sharply back to good tissues while the attachment site at the superior pole of the  patella was debrided down to bleeding bone with a rongeur. Three Smith [employer] Nephew 5 mm titanium corkscrew anchors were inserted into the superior pole of the patella after predrilling with a 4 mm drill. Each set of sutures from each anchor were woven through the quadriceps tendon. Once all of the sutures were placed, the tendon was reapproximated and each suture tied securely to effect the repair. Utilizing a #5 FiberWire, the anterior fibers of the repair were reapproximated to further reinforce the repair. In addition, several #2 FiberWire sutures were placed medially and laterally through the margins of the quadriceps tendon and adjacent retinacular tissues to further reinforce this repair. The repair was shown to be stable with knee flexion to 60, supporting the weight of the lower leg independently in the process.  The wound was copiously irrigated with sterile saline solution using bulb irrigation before the superficial retinacular layer was re-approximated using #0 Vicryl interrupted sutures. The subcutaneous tissues were closed in two layers using 2-0 Vicryl interrupted sutures before the skin was closed using staples. A sterile occlusive dressing was applied to the knee before a Polar Care device was applied to the leg. The leg was placed into a postoperative hinged knee brace with the hinges set at 0-45, but locked in extension. The patient was then awakened, extubated, and returned to the recovery room in satisfactory condition after tolerating the procedure well.

## 2020-02-15 NOTE — Progress Notes (Signed)
Patient has agreed to stay so I have sent a message to Dr. Roland Rack about keeping him overnight for observation.

## 2020-02-15 NOTE — Anesthesia Preprocedure Evaluation (Addendum)
Anesthesia Evaluation  Patient identified by MRN, date of birth, ID band Patient awake    Reviewed: Allergy & Precautions, H&P , NPO status , Patient's Chart, lab work & pertinent test results  Airway Mallampati: III  TM Distance: >3 FB Neck ROM: full    Dental  (+) Poor Dentition, Chipped, Missing   Pulmonary neg shortness of breath, neg sleep apnea, neg COPD, neg recent URI, former smoker,           Cardiovascular hypertension, (-) angina(-) Past MI and (-) Cardiac Stents (-) dysrhythmias      Neuro/Psych negative neurological ROS  negative psych ROS   GI/Hepatic negative GI ROS, Neg liver ROS,   Endo/Other  negative endocrine ROS  Renal/GU      Musculoskeletal   Abdominal   Peds  Hematology negative hematology ROS (+)   Anesthesia Other Findings Past Medical History: No date: Hypertension  Past Surgical History: 02/04/2017: INGUINAL HERNIA REPAIR; Right     Comment:  Procedure: HERNIA REPAIR INGUINAL ADULT;  Surgeon:               Leonie Green, MD;  Location: [facility] ORS;  Service:               General;  Laterality: Right; No date: NO PAST SURGERIES     Reproductive/Obstetrics negative OB ROS                            Anesthesia Physical Anesthesia Plan  ASA: II  Anesthesia Plan: General LMA   Post-op Pain Management:    Induction:   PONV Risk Score and Plan: Dexamethasone, Ondansetron, Midazolam and Treatment may vary due to age or medical condition  Airway Management Planned:   Additional Equipment:   Intra-op Plan:   Post-operative Plan:   Informed Consent: I have reviewed the patients History and Physical, chart, labs and discussed the procedure including the risks, benefits and alternatives for the proposed anesthesia with the patient or authorized representative who has indicated his/her understanding and acceptance.     Dental Advisory Given  Plan  Discussed with: Anesthesiologist  Anesthesia Plan Comments: (The pt declined a femoral nerve block for postop pain control.  KR)      Anesthesia Quick Evaluation

## 2020-02-15 NOTE — Plan of Care (Signed)

## 2020-02-15 NOTE — Discharge Instructions (Addendum)
AMBULATORY SURGERY  DISCHARGE INSTRUCTIONS   1) The drugs that you were given will stay in your system until tomorrow so for the next 24 hours you should not:  A) Drive an automobile B) Make any legal decisions C) Drink any alcoholic beverage   2) You may resume regular meals tomorrow.  Today it is better to start with liquids and gradually work up to solid foods.  You may eat anything you prefer, but it is better to start with liquids, then soup and crackers, and gradually work up to solid foods.   3) Please notify your doctor immediately if you have any unusual bleeding, trouble breathing, redness and pain at the surgery site, drainage, fever, or pain not relieved by medication. 4)   5) Your post-operative visit with Dr.                                     is: Date:                        Time:    Please call to schedule your post-operative visit.  6) Additional Instructions:      Orthopedic discharge instructions: May sponge bathe with intact OpSite dressing.  Apply ice frequently to knee or use Polar Care. Take ibuprofen 600-800 mg TID with meals for 7-10 days, then as necessary. Take pain medication as prescribed when needed.  May supplement with ES Tylenol if necessary. May weight-bear as tolerated so long as brace is on and locked in extension - use crutches or cane as needed. Follow-up in 10-14 days or as scheduled.

## 2020-02-15 NOTE — H&P (Signed)
Paper H&P to be scanned into permanent record. H&P reviewed and patient re-examined. No changes. 

## 2020-02-15 NOTE — Transfer of Care (Signed)
Immediate Anesthesia Transfer of Care Note  Patient: John [name]  Procedure(s) Performed: REPAIR QUADRICEP TENDON (Left Knee)  Patient Location: PACU  Anesthesia Type:General  Level of Consciousness: awake, drowsy and patient cooperative  Airway & Oxygen Therapy: Patient Spontanous Breathing and Patient connected to face mask oxygen  Post-op Assessment: Report given to RN and Post -op Vital signs reviewed and stable  Post vital signs: Reviewed and stable  Last Vitals:  Vitals Value Taken Time  BP 171/135 02/15/20 1409  Temp    Pulse 105 02/15/20 1410  Resp 31 02/15/20 1410  SpO2 99 % 02/15/20 1410  Vitals shown include unvalidated device data.  Last Pain:  Vitals:   02/15/20 1358  TempSrc:   PainSc: (P) Asleep         Complications: No apparent anesthesia complications

## 2020-02-15 NOTE — Progress Notes (Addendum)
I called Dr. Roland Rack back and told him that the social worker has left and he stated he talked to another Education officer, museum and they would not be able to put him on home oxygen and that he will have to stay. Patient stated he cannot afford to pay for an overnight stay in the hospital. I am going to have the patient use an incentive spirometer to see if he can stay above 90.

## 2020-02-15 NOTE — Progress Notes (Signed)
Patient is having trouble keeping his oxygen levels above 90. I called Dr. Roland Rack to let him know about the patient so he asked if I could get in touch with the social worker concerning home oxygen. I will call and keep him posted.

## 2020-02-15 NOTE — Evaluation (Signed)
Physical Therapy Evaluation Patient Details Name: John [name] MRN: [id number] DOB: July 07, 1946 Today's Date: 02/15/2020   History of Present Illness  Pt is 46 male s/p L quadricep tendon repair. PMH of smoking, HTN.  Clinical Impression  Pt alert, oriented, on 2L via [place] throughout session RN aware and stated difficulty weaning to room air. Pt stated he was independent at baseline, lives in a house with 3 STE, and a friend who would be available 24/7 to assist.   The patient demonstrated bed mobility with minA for LLE management. Sit to stand from elevated bed surface with CGA and crutches, extended time needed. Pt returned to sitting later in session with SPC, poor eccentric control noted despite cueing for hand placement. The patient was able to ambulate with bilateral axillary crutches, as well as SPC, steady/safe with both options, pt preferred SPC. Pt exhibited decreased step length/height bilaterally but not unsteadiness noted. Stair navigation performed as well; CGA and bilateral hand rails utilized, verbal cues for step by step instructions, pt verbalized understanding. After stairs and ambulation pt spO2 86-87%, ~2 minutes to recover to 90%, RN aware. PT reviewed polar care, immobilizer, weight bearing status, and stair navigation at end of session, pt with no further questions.  Overall the patient demonstrated deficits (see "PT Problem List") that impede the patient's functional abilities, safety, and mobility and would benefit from skilled PT intervention. Recommendation is HHPT with intermittent supervision.      Follow Up Recommendations Home health PT;Supervision - Intermittent    Equipment Recommendations  Other (comment)(Pt has SPC at home)    Recommendations for Other Services       Precautions / Restrictions Precautions Precautions: Fall Required Braces or Orthoses: Knee Immobilizer - Left Knee Immobilizer - Left: On at all times(locked in extension) Restrictions Weight  Bearing Restrictions: Yes      Mobility  Bed Mobility Overal bed mobility: Needs Assistance Bed Mobility: Supine to Sit     Supine to sit: HOB elevated;Min assist     General bed mobility comments: minA for LLE management  Transfers Overall transfer level: Needs assistance Equipment used: Crutches;Straight cane Transfers: Sit to/from Stand Sit to Stand: From elevated surface;Min guard         General transfer comment: significant time needed for first transfer, CGA for steadying during attempt with crutches, poor eccentric control noted upon returning to sitting with cane  Ambulation/Gait   Gait Distance (Feet): 80 Feet Assistive device: Crutches;Straight cane   Gait velocity: decreased   General Gait Details: Pt performed ambulation with reciprocal gait pattern, decreased step length/height noted bilaterally  Stairs Stairs: Yes Stairs assistance: Min guard Stair Management: Two rails;Step to pattern Number of Stairs: 4 General stair comments: pt instructed in technique, overall steady, safe with CGA  Wheelchair Mobility    Modified Rankin (Stroke Patients Only)       Balance Overall balance assessment: Needs assistance Sitting-balance support: Feet supported;Bilateral upper extremity supported Sitting balance-Leahy Scale: Fair       Standing balance-Leahy Scale: Fair                               Pertinent Vitals/Pain Pain Assessment: 0-10 Pain Score: 3  Pain Location: L knee Pain Intervention(s): Limited activity within patient's tolerance;Monitored during session;Repositioned;Premedicated before session;Ice applied    Home Living Family/patient expects to be discharged to:: Private residence   Available Help at Discharge: Friend(s);Available 24 hours/day Type of Home: [place]  Access: Stairs to enter Entrance Stairs-Rails: Right;Left;Can reach both Entrance Stairs-Number of Steps: 3 Home Layout: One level Home Equipment: Cane  - single point      Prior Function Level of Independence: Independent               Hand Dominance   Dominant Hand: Right    Extremity/Trunk Assessment   Upper Extremity Assessment Upper Extremity Assessment: Generalized weakness    Lower Extremity Assessment Lower Extremity Assessment: RLE deficits/detail;LLE deficits/detail RLE Deficits / Details: WFLs LLE: Unable to fully assess due to immobilization    Cervical / Trunk Assessment Cervical / Trunk Assessment: Normal  Communication   Communication: No difficulties  Cognition Arousal/Alertness: Awake/alert Behavior During Therapy: WFL for tasks assessed/performed Overall Cognitive Status: Within Functional Limits for tasks assessed                                        General Comments      Exercises Other Exercises Other Exercises: Pt instructed in polar care use, PT role, weight bearing precautions, verbalized understanding   Assessment/Plan    PT Assessment Patient needs continued PT services  PT Problem List Decreased strength;Decreased mobility;Decreased range of motion;Decreased knowledge of precautions;Decreased activity tolerance;Decreased balance;Pain;Decreased knowledge of use of DME       PT Treatment Interventions DME instruction;Therapeutic exercise;Balance training;Stair training;Gait training;Neuromuscular re-education;Functional mobility training;Therapeutic activities;Patient/family education    PT Goals (Current goals can be found in the Care Plan section)  Acute Rehab PT Goals Patient Stated Goal: to go home PT Goal Formulation: With patient Time For Goal Achievement: 02/29/20 Potential to Achieve Goals: Good    Frequency 7X/week   Barriers to discharge        Co-evaluation               AM-PAC PT "6 Clicks" Mobility  Outcome Measure Help needed turning from your back to your side while in a flat bed without using bedrails?: A Little Help needed moving  from lying on your back to sitting on the side of a flat bed without using bedrails?: A Little Help needed moving to and from a bed to a chair (including a wheelchair)?: None Help needed standing up from a chair using your arms (e.g., wheelchair or bedside chair)?: None Help needed to walk in hospital room?: None Help needed climbing 3-5 steps with a railing? : A Little 6 Click Score: 21    End of Session Equipment Utilized During Treatment: Gait belt Activity Tolerance: Patient tolerated treatment well Patient left: in chair;with nursing/sitter in room Nurse Communication: Mobility status PT Visit Diagnosis: Other abnormalities of gait and mobility (R26.89);Difficulty in walking, not elsewhere classified (R26.2);Pain Pain - Right/Left: Left Pain - part of body: Knee    Time: 5361-4431 PT Time Calculation (min) (ACUTE ONLY): 33 min   Charges:   PT Evaluation $PT Eval Low Complexity: 1 Low PT Treatments $Gait Training: 23-37 mins       Lieutenant Diego PT, DPT 4:25 PM,02/15/20

## 2020-02-15 NOTE — Progress Notes (Signed)
Patient was asked periodically during the day if he had to urinate and the answer was always no. I asked the patient if he has urinary issues and he stated that he stopped taking his "fluid" pill last week therefore he has not had to urinate as often during the day. I asked why he stopped the pill and he stated he was tired of getting up at night to use the restroom so I asked if he could maybe take the pill in the morning then he would urinate all day and sleep at night. He said he hadn't thought much about that. I told him him may want to give it a try. I decided to bladder scan the patient to check to see if he was retaining a lot of fluid. The bladder scan documented 286 so I told the patient that he will need to try again later to urinate because he will probably be full later. Patient was not given a spinal I just wanted to make sure the patient was not having another issue.

## 2020-02-15 NOTE — Anesthesia Procedure Notes (Signed)
Procedure Name: Intubation Date/Time: 02/15/2020 12:15 PM Performed by: Lowry Bowl, CRNA Pre-anesthesia Checklist: Patient identified, Emergency Drugs available, Suction available and Patient being monitored Patient Re-evaluated:Patient Re-evaluated prior to induction Oxygen Delivery Method: Circle system utilized Preoxygenation: Pre-oxygenation with 100% oxygen Induction Type: IV induction and Cricoid Pressure applied Ventilation: Oral airway inserted - appropriate to patient size and Two handed mask ventilation required Laryngoscope Size: Mac and 4 Grade View: Grade II Tube type: Oral Tube size: 7.5 mm Number of attempts: 1 Airway Equipment and Method: Stylet Placement Confirmation: ETT inserted through vocal cords under direct vision,  positive ETCO2 and breath sounds checked- equal and bilateral Secured at: 23 cm Tube secured with: Tape Dental Injury: Teeth and Oropharynx as per pre-operative assessment

## 2020-02-15 NOTE — Progress Notes (Signed)
Patient's oxygen levels are not staying above 90 and does not want to stay. I have asked Dr. Ronelle Nigh to come over to post op and explained how serious it is if he leaves AMA. I will continue to monitor him and let him know how serious his levels are.

## 2020-02-16 ENCOUNTER — Inpatient Hospital Stay: Payer: Medicare HMO

## 2020-02-16 DIAGNOSIS — M25562 Pain in left knee: Secondary | ICD-10-CM | POA: Diagnosis present

## 2020-02-16 DIAGNOSIS — E669 Obesity, unspecified: Secondary | ICD-10-CM | POA: Diagnosis present

## 2020-02-16 DIAGNOSIS — Z20822 Contact with and (suspected) exposure to covid-19: Secondary | ICD-10-CM | POA: Diagnosis present

## 2020-02-16 DIAGNOSIS — Z79899 Other long term (current) drug therapy: Secondary | ICD-10-CM | POA: Diagnosis not present

## 2020-02-16 DIAGNOSIS — Z6833 Body mass index (BMI) 33.0-33.9, adult: Secondary | ICD-10-CM | POA: Diagnosis not present

## 2020-02-16 DIAGNOSIS — I10 Essential (primary) hypertension: Secondary | ICD-10-CM | POA: Diagnosis present

## 2020-02-16 DIAGNOSIS — J449 Chronic obstructive pulmonary disease, unspecified: Secondary | ICD-10-CM | POA: Diagnosis present

## 2020-02-16 DIAGNOSIS — W1830XA Fall on same level, unspecified, initial encounter: Secondary | ICD-10-CM | POA: Diagnosis present

## 2020-02-16 DIAGNOSIS — S76112A Strain of left quadriceps muscle, fascia and tendon, initial encounter: Secondary | ICD-10-CM | POA: Diagnosis present

## 2020-02-16 DIAGNOSIS — E785 Hyperlipidemia, unspecified: Secondary | ICD-10-CM | POA: Diagnosis present

## 2020-02-16 DIAGNOSIS — J9611 Chronic respiratory failure with hypoxia: Secondary | ICD-10-CM | POA: Diagnosis present

## 2020-02-16 DIAGNOSIS — Z87891 Personal history of nicotine dependence: Secondary | ICD-10-CM | POA: Diagnosis not present

## 2020-02-16 HISTORY — DX: Chronic obstructive pulmonary disease, unspecified: J44.9

## 2020-02-16 MED ORDER — ASPIRIN EC 325 MG PO TBEC: 325.0000 mg | DELAYED_RELEASE_TABLET | Freq: Every day | ORAL | 0 refills | Status: DC

## 2020-02-16 MED ORDER — OXYCODONE HCL 5 MG PO TABS: 5.0000 mg | ORAL_TABLET | ORAL | 0 refills | Status: DC | PRN

## 2020-02-16 NOTE — TOC Progression Note (Signed)
Transition of Care [facility]) - Progression Note    Patient Details  Name: John [name] MRN: [id number] Date of Birth: [birth year]  Transition of [facility]) CM/SW Contact  Su Hilt, RN Phone Number: 02/16/2020, 11:41 AM  Clinical Narrative:    Spoke with the patient about the importance of getting that chest xray to determine why his oxygen levels drop, he agrees to do so   Expected Discharge Plan: [place] Barriers to Discharge: Barriers Resolved  Expected Discharge Plan and Services Expected Discharge Plan: [place]   Discharge Planning Services: CM Consult   Living arrangements for the past 2 months: Single Family Home Expected Discharge Date: 02/15/20               DME Arranged: Oxygen DME Agency: [employer] Date DME Agency Contacted: 02/16/20 Time DME Agency Contacted: [phone] Representative spoke with at DME Agency: [place]: PT [place]: [facility] [employer]) Date [place]: 02/16/20 Time [place]: [phone] Representative spoke with at [place]: [place] ([place]) Interventions    Readmission Risk Interventions No flowsheet data found.

## 2020-02-16 NOTE — Anesthesia Postprocedure Evaluation (Signed)
Anesthesia Post Note  Patient: John [name]  Procedure(s) Performed: REPAIR QUADRICEP TENDON (Left Knee)  Patient location during evaluation: PACU Anesthesia Type: General Level of consciousness: awake and alert and oriented Pain management: pain level controlled Vital Signs Assessment: post-procedure vital signs reviewed and stable Respiratory status: spontaneous breathing, nonlabored ventilation and respiratory function stable Cardiovascular status: blood pressure returned to baseline and stable Postop Assessment: no signs of nausea or vomiting Anesthetic complications: no     Last Vitals:  Vitals:   02/16/20 0439 02/16/20 0748  BP: (!) 133/94 (!) 149/89  Pulse: 88 90  Resp: 18 17  Temp: 36.6 C 36.7 C  SpO2: 95% 95%    Last Pain:  Vitals:   02/16/20 0748  TempSrc: Oral  PainSc:                  Mcihael Hinderman

## 2020-02-16 NOTE — Discharge Summary (Addendum)
Physician Discharge Summary  Patient ID: John [name] MRN: [id number] DOB/AGE: 75-05-1946 75 y.o.  Admit date: 02/15/2020 Discharge date: 02/17/20  Admission Diagnoses:  Rupture of left quadriceps tendon [K56.256L]  Discharge Diagnoses: Patient Active Problem List   Diagnosis Date Noted  . COPD (chronic obstructive pulmonary disease) ([place]) 02/16/2020  . Rupture of left quadriceps tendon 02/15/2020  . Community acquired pneumonia 09/01/2019  . Pneumonia 09/01/2019    Past Medical History:  Diagnosis Date  . Hypertension      Transfusion: None.   Consultants (if any):   Discharged Condition: Improved  Hospital Course: John [name] is an 74 y.o. male who was admitted 02/15/2020 with a diagnosis of a acute left quadriceps rupture and went to the operating room on 02/15/2020 and underwent the above named procedures.    Surgeries: Procedure(s): REPAIR QUADRICEP TENDON on 02/15/2020 Patient tolerated the surgery well. Taken to PACU where he was stabilized.  In the PACU the patient was unable to sustain O2 sats above 90% not on room air.  He was admitted over night for observation.  Stable on 2L of 02.  Has history of COPD and there have been discussions with him in the past about having supplemental oxygen.   Physical therapy started on day #1 for gait training and transfer. OT started day #1 for ADL and assisted devices.  Patient's IV was removed on POD1.  Implants: [employer] 5 mm titanium corkscrew anchors x3  He was given perioperative antibiotics:  Anti-infectives (From admission, onward)   Start     Dose/Rate Route Frequency Ordered Stop   02/15/20 1015  ceFAZolin (ANCEF) IVPB 2g/100 mL premix     2 g 200 mL/hr over 30 Minutes Intravenous On call to O.R. 02/15/20 1001 02/15/20 1156   02/15/20 1011  ceFAZolin (ANCEF) 2-4 GM/100ML-% IVPB    Note to Pharmacy: Norton Blizzard  : cabinet override      02/15/20 1011 02/15/20 1200    .  He was given sequential  compression devices, early ambulation, and Lovenox for DVT prophylaxis.  He benefited maximally from the hospital stay and there were no complications.    Recent vital signs:  Vitals:   02/16/20 0748 02/16/20 1148  BP: (!) 149/89 135/88  Pulse: 90 87  Resp: 17 17  Temp: 98 F (36.7 C) 98.6 F (37 C)  SpO2: 95% 94%    Recent laboratory studies:  Lab Results  Component Value Date   HGB 16.0 02/15/2020   HGB 15.2 09/02/2019   HGB 14.5 09/01/2019   Lab Results  Component Value Date   WBC 9.3 09/02/2019   PLT 175 09/02/2019   No results found for: INR Lab Results  Component Value Date   NA 141 02/15/2020   K 3.8 02/15/2020   CL 102 02/15/2020   CO2 28 09/02/2019   BUN 13 02/15/2020   CREATININE 1.30 (H) 02/15/2020   GLUCOSE 121 (H) 02/15/2020    Discharge Medications:   Allergies as of 02/16/2020   No Known Allergies     Medication List    TAKE these medications   aspirin EC 325 MG tablet Take 1 tablet (325 mg total) by mouth daily.   cyclobenzaprine 5 MG tablet Commonly known as: FLEXERIL Take 5 mg by mouth 2 (two) times daily as needed for muscle spasms.   oxyCODONE 5 MG immediate release tablet Commonly known as: Oxy IR/ROXICODONE Take 1-2 tablets (5-10 mg total) by mouth every 4 (four) hours as needed for severe  pain (pain score 7-10).   traMADol 50 MG tablet Commonly known as: ULTRAM Take 50 mg by mouth 3 (three) times daily as needed for pain.            Durable Medical Equipment  (From admission, onward)         Start     Ordered   02/15/20 1344  For home use only DME Walker rolling  Once    Question Answer Comment  Walker: With 5 Inch Wheels   Patient needs a walker to treat with the following condition Rupture of left quadriceps tendon      02/15/20 1343         Diagnostic Studies: DG Chest 2 View  Result Date: 02/16/2020 CLINICAL DATA:  Shortness of breath EXAM: CHEST - 2 VIEW COMPARISON:  09/01/2019 FINDINGS: There is  hyperinflation of the lungs compatible with COPD. No confluent opacities or effusions. Heart is borderline in size. No acute bony abnormality. IMPRESSION: COPD.  No active disease. Electronically Signed   By: Rolm Baptise M.D.   On: 02/16/2020 12:24    Disposition:   Will plan for d/c home tomorrow on 2L of O2.  Follow-up with PCP after discharge to discuss continued need for home O2.   Follow-up Information    Poggi, Marshall Cork, MD Follow up.   Specialty: Orthopedic Surgery Why: Call in AM to schedule follow up appointment for 10- 14 days Contact information: [place] [address] [phone]          Signed: Judson Roch PA-C 02/16/2020, 12:58 PM

## 2020-02-16 NOTE — Progress Notes (Signed)
Physical Therapy Treatment Patient Details Name: John [name] MRN: [id number] DOB: [birth year] Today's Date: 02/16/2020    History of Present Illness Pt is 57 male s/p L quadricep tendon repair. PMH of smoking, HTN.    PT Comments    Pt alert, in bed, reported 1-2/10 pain of L leg, brace donned throughout and locked in extension. Pt trialled on room air, desaturated to 67%, able to slowly recover to low 90s on 2L via [place], and remained on 2L throughout remainder of session. Pt demonstrated bed mobility with supervision, extended time and extensive use of UE for sit <> stand transfer with cane, CGA provided. The patient ambulated ~137ft with SPC and CGA/supervision. 1-2 standing rest breaks via PT request to address mild desaturation to mid 80s, pt able to recover with cues for PLB. Pt also in mid 80s once returned to chair in room, but able to recover >80min. Pt asymptomatic throughout. The patient would benefit from further skilled PT intervention to maximize safety, mobility, and independence.      Follow Up Recommendations  Home health PT;Supervision - Intermittent     Equipment Recommendations  Other (comment)(pt has SPC at home)    Recommendations for Other Services       Precautions / Restrictions Precautions Precautions: Fall Required Braces or Orthoses: Knee Immobilizer - Left Knee Immobilizer - Left: On at all times(locked in extension) Restrictions Weight Bearing Restrictions: Yes LLE Weight Bearing: Weight bearing as tolerated    Mobility  Bed Mobility Overal bed mobility: Needs Assistance Bed Mobility: Supine to Sit     Supine to sit: Supervision;HOB elevated        Transfers Overall transfer level: Needs assistance Equipment used: Straight cane Transfers: Sit to/from Stand Sit to Stand: Min guard         General transfer comment: significant time needed to allow pt to successfully weight shift, heavy use of UE to be successful  Ambulation/Gait   Gait  Distance (Feet): 100 Feet Assistive device: Straight cane   Gait velocity: decreased   General Gait Details: Pt with decreased step length bilaterally, occasionally shuffled steps but no LOB noted. Good weight bearing of LLE   Stairs             Wheelchair Mobility    Modified Rankin (Stroke Patients Only)       Balance Overall balance assessment: Needs assistance Sitting-balance support: Feet supported;Bilateral upper extremity supported Sitting balance-Leahy Scale: Fair       Standing balance-Leahy Scale: Fair                              Cognition Arousal/Alertness: Awake/alert Behavior During Therapy: WFL for tasks assessed/performed Overall Cognitive Status: Within Functional Limits for tasks assessed                                        Exercises Other Exercises Other Exercises: oxygen saturation monitored throughout session, unable to maintain saturations on room air. 2L via [place] for all ambulation    General Comments        Pertinent Vitals/Pain      Home Living                      Prior Function            PT Goals (current goals can now  be found in the care plan section) Progress towards PT goals: Progressing toward goals    Frequency    7X/week      PT Plan Current plan remains appropriate    Co-evaluation              AM-PAC PT "6 Clicks" Mobility   Outcome Measure  Help needed turning from your back to your side while in a flat bed without using bedrails?: A Little Help needed moving from lying on your back to sitting on the side of a flat bed without using bedrails?: A Little Help needed moving to and from a bed to a chair (including a wheelchair)?: None Help needed standing up from a chair using your arms (e.g., wheelchair or bedside chair)?: None Help needed to walk in hospital room?: None Help needed climbing 3-5 steps with a railing? : A Little 6 Click Score: 21    End of  Session Equipment Utilized During Treatment: Gait belt Activity Tolerance: Patient tolerated treatment well Patient left: in chair;Other (comment)(with OT in room) Nurse Communication: Mobility status PT Visit Diagnosis: Other abnormalities of gait and mobility (R26.89);Difficulty in walking, not elsewhere classified (R26.2);Pain Pain - Right/Left: Left Pain - part of body: Knee     Time: 0852-0919 PT Time Calculation (min) (ACUTE ONLY): 27 min  Charges:  $Therapeutic Exercise: 23-37 mins                     Lieutenant Diego PT, DPT 10:05 AM,02/16/20

## 2020-02-16 NOTE — Progress Notes (Signed)
Patient dropped to 67% on room air while working with PT. Still refusing chest xray

## 2020-02-16 NOTE — TOC Transition Note (Addendum)
Transition of Care [facility]) - CM/SW Discharge Note   Patient Details  Name: John [name] MRN: [id number] Date of Birth: 1946-02-18  Transition of Care [facility]) CM/SW Contact:  Su Hilt, RN Phone Number: 02/16/2020, 2:55 PM   Clinical Narrative:    The patient will be staying an additional night to ensure he is safe  His leg is starting to hurt him and The physician is aware He will dc on Home oxygen once he is able to DC, The Oxygen is in the room and he is aware of how to turn on the Concentrator, He also has the portable tank in the room to take home with him   Final next level of care: [place] Barriers to Discharge: Barriers Resolved   Patient Goals and CMS Choice Patient states their goals for this hospitalization and ongoing recovery are:: go home      Discharge Placement                       Discharge Plan and Services   Discharge Planning Services: CM Consult            DME Arranged: Oxygen DME Agency: [employer] Date DME Agency Contacted: 02/16/20 Time DME Agency Contacted: [phone] Representative spoke with at DME Agency: [place]: PT [place]: [facility] [employer] Date [place]: 02/16/20 Time [place]: [phone] Representative spoke with at [place]: [place] ([place]) Interventions     Readmission Risk Interventions No flowsheet data found.

## 2020-02-16 NOTE — Progress Notes (Signed)
Oxygen Qualification Note  SaO2 on room air at rest = 67% SaO2 on 2 liters of O2 while ambulating = 90%  Lieutenant Diego PT, DPT 9:29 AM,02/16/20

## 2020-02-16 NOTE — TOC Transition Note (Signed)
Transition of Care [facility]) - CM/SW Discharge Note   Patient Details  Name: John [name] MRN: [id number] Date of Birth: [birth year]  Transition of [facility]) CM/SW Contact:  Su Hilt, RN Phone Number: 02/16/2020, 9:41 AM   Clinical Narrative:    Met with the patient to discuss DC plan and needs He lives with a friend and has some assisitance at home He will need Home oxygen, I notifed Brad with [employer] He has a cane and does not want other DME He is agreeable to [facility] PT I [facility] up with [facility] I notified Helene Kelp with [facility]  Final next level of care: [place] Barriers to Discharge: Barriers Resolved   Patient Goals and CMS Choice Patient states their goals for this hospitalization and ongoing recovery are:: go home      Discharge Placement                       Discharge Plan and Services   Discharge Planning Services: CM Consult            DME Arranged: Oxygen DME Agency: [employer] Date DME Agency Contacted: 02/16/20 Time DME Agency Contacted: [phone] Representative spoke with at DME Agency: [place]: PT [place]: [facility] [employer] Date [place]: 02/16/20 Time [place]: [phone] Representative spoke with at [place]: [place] ([place]) Interventions     Readmission Risk Interventions No flowsheet data found.

## 2020-02-16 NOTE — Evaluation (Addendum)
Occupational Therapy Evaluation Patient Details Name: John [name] MRN: [id number] DOB: 1946-08-31 Today's Date: 02/16/2020    History of Present Illness John [name] is 51 male s/p L quadricep tendon repair. PMH of smoking, COPD, and HTN. John [name] with some post op hypoxia requiring 2Lnc, adm on observation to monitor.   Clinical Impression   John [name] was seen for OT evaluation this date. Prior to hospital admission, John [name] was Indep with all ADLs/IADLs. John [name] lives with a friend who will be available 24/7 upon d/c at least for a few weeks per John [name], to assist. John [name] has 3 STE and has SPC which he used infrequently at baseline. Currently John [name] demonstrates impairments as described below (See OT problem list) which functionally limit his ability to perform ADL/self-care tasks. John [name] currently requires MIN A with LB ADLs with AE while in sitting d/t immobilization of L LE and CGA for ADL transfers with [facility].  John [name] would benefit from skilled OT to address noted impairments and functional limitations (see below for any additional details) in order to maximize safety and independence while minimizing falls risk and caregiver burden. Upon hospital discharge, recommend HHOT to maximize John [name] safety and return to functional independence during meaningful occupations of daily life. Of note: John [name]'s spO2 sustained in93-96% range on 2Lnc throughout OT session. No SOB noted. OT does facilitate education re: breathing exercises and PLB technique/purpose. John [name] with good reception.     Follow Up Recommendations  Home health OT;Supervision - Intermittent(supv/assist for polar care/compression stocking mgt and OOB activity)    Equipment Recommendations  Toilet rise with handles    Recommendations for Other Services       Precautions / Restrictions Precautions Precautions: Fall Required Braces or Orthoses: Knee Immobilizer - Left Knee Immobilizer - Left: On at all times(locked in extension) Restrictions Weight Bearing Restrictions: Yes LLE Weight  Bearing: Weight bearing as tolerated Other Position/Activity Restrictions: monitor O2 with activity      Mobility Bed Mobility         General bed mobility comments: John [name] received sitting up in chair when OT presents.  Transfers Overall transfer level: Needs assistance Equipment used: Straight cane Transfers: Sit to/from Stand Sit to Stand: Min guard         General transfer comment: extended time to pull L LE back when weight shifting on sit to stand.    Balance Overall balance assessment: Needs assistance Sitting-balance support: Feet supported Sitting balance-Leahy Scale: Fair     Standing balance support: Single extremity supported Standing balance-Leahy Scale: Fair Standing balance comment: CGA with SPC support                           ADL either performed or assessed with clinical judgement   ADL                                         General ADL Comments: John [name] requires MIN/MOD A with seated LB ADLs d/t limited ROM of L knee with immobilizer. John [name] somewhat more successful with AE, still requiring at least MIN A. Setup to total Indep with all IADLs.     Vision Patient Visual Report: No change from baseline       Perception     Praxis      Pertinent Vitals/Pain Pain Assessment: 0-10 Pain Score: 3  Pain Location: L knee Pain Descriptors / Indicators: Sore Pain  Intervention(s): Monitored during session;Ice applied     Hand Dominance Right   Extremity/Trunk Assessment Upper Extremity Assessment Upper Extremity Assessment: Overall WFL for tasks assessed(shld, elbow, grip grossly 4/5 bilaterally)   Lower Extremity Assessment Lower Extremity Assessment: Defer to John [name] evaluation;RLE deficits/detail;LLE deficits/detail RLE Deficits / Details: WFLs LLE: Unable to fully assess due to immobilization       Communication Communication Communication: No difficulties   Cognition Arousal/Alertness: Awake/alert Behavior During  Therapy: WFL for tasks assessed/performed Overall Cognitive Status: Within Functional Limits for tasks assessed                                     General Comments       Exercises Other Exercises Other Exercises: OT facilitates education re: LB ADL modification and AE. John [name] verbalized understanding. Handout issued. Other Exercises: OT facilitaes education re: polar care and compression stocking mgt. John [name] with good understanding. Handout issued.   Shoulder Instructions      Home Living Family/patient expects to be discharged to:: Private residence Living Arrangements: Non-relatives/Friends Available Help at Discharge: Friend(s);Available 24 hours/day Type of Home: House Home Access: Stairs to enter [employer] of Steps: 3 Entrance Stairs-Rails: Right;Left;Can reach both Home Layout: One level     Bathroom Shower/Tub: Teacher, early years/pre: Standard     Home Equipment: Cane - single point          Prior Functioning/Environment Level of Independence: Independent        Comments: John [name] reports very active lifestyle at baseline including playing tennis regularly        OT Problem List: Decreased range of motion;Decreased activity tolerance;Impaired balance (sitting and/or standing);Decreased knowledge of use of DME or AE;Cardiopulmonary status limiting activity;Increased edema      OT Treatment/Interventions: Self-care/ADL training;Therapeutic exercise;Energy conservation;DME and/or AE instruction;Therapeutic activities;Patient/family education;Balance training    OT Goals(Current goals can be found in the care plan section) Acute Rehab OT Goals Patient Stated Goal: to go home OT Goal Formulation: With patient Time For Goal Achievement: 03/01/20 Potential to Achieve Goals: Good  OT Frequency: Min 2X/week   Barriers to D/C:            Co-evaluation              AM-PAC OT "6 Clicks" Daily Activity     Outcome Measure Help  from another person eating meals?: None Help from another person taking care of personal grooming?: None Help from another person toileting, which includes using toliet, bedpan, or urinal?: A Little Help from another person bathing (including washing, rinsing, drying)?: A Little Help from another person to put on and taking off regular upper body clothing?: None Help from another person to put on and taking off regular lower body clothing?: A Little 6 Click Score: 21   End of Session Equipment Utilized During Treatment: Gait belt  Activity Tolerance: Patient tolerated treatment well Patient left: in chair;with call bell/phone within reach;with chair alarm set  OT Visit Diagnosis: Unsteadiness on feet (R26.81)                Time: 9675-9163 OT Time Calculation (min): 38 min Charges:  OT General Charges $OT Visit: 1 Visit OT Evaluation $OT Eval Moderate Complexity: 1 Mod OT Treatments $Self Care/Home Management : 8-22 mins $Therapeutic Activity: 8-22 mins  Gerrianne Scale, MS, OTR/L ascom [phone] 02/16/20, 10:43 AM

## 2020-02-16 NOTE — Progress Notes (Addendum)
Subjective: 1 Day Post-Op Procedure(s) (LRB): REPAIR QUADRICEP TENDON (Left) Patient reports pain as mild.   Patient is reports pain as mild to the left leg and reports no issues however patient hypoxic after surgery, unable to keep O2 sats above 90% so admitted overnight for observation. Plan is to go Home after hospital stay. Negative for chest pain and shortness of breath Fever: no Gastrointestinal:Negative for nausea and vomiting  Objective: Vital signs in last 24 hours: Temp:  [96.9 F (36.1 C)-99.2 F (37.3 C)] 98 F (36.7 C) (03/03 0748) Pulse Rate:  [79-104] 90 (03/03 0748) Resp:  [0-28] 17 (03/03 0748) BP: (131-162)/(80-125) 149/89 (03/03 0748) SpO2:  [88 %-99 %] 95 % (03/03 0748) Weight:  [106.6 kg] 106.6 kg (03/02 1029)  Intake/Output from previous day:  Intake/Output Summary (Last 24 hours) at 02/16/2020 0811 Last data filed at 02/16/2020 0336 Gross per 24 hour  Intake 1400 ml  Output 535 ml  Net 865 ml    Intake/Output this shift: No intake/output data recorded.  Labs: Recent Labs    02/15/20 1034  HGB 16.0   Recent Labs    02/15/20 1034  HCT 47.0   Recent Labs    02/15/20 1034  NA 141  K 3.8  CL 102  BUN 13  CREATININE 1.30*  GLUCOSE 121*   No results for input(s): LABPT, INR in the last 72 hours.   EXAM General - Patient is Alert, Appropriate and Oriented  Lungs: Normal respiratory effort.  Intact lung sounds bilaterally. Extremity - Sensation intact distally Intact pulses distally Dorsiflexion/Plantar flexion intact Incision: dressing C/D/I Dressing/Incision - clean, dry, no drainage, left knee brace is intact, locked in extension. Motor Function - intact, moving foot and toes well on exam.   Past Medical History:  Diagnosis Date  . Hypertension     Assessment/Plan: 1 Day Post-Op Procedure(s) (LRB): REPAIR QUADRICEP TENDON (Left) Active Problems:   Rupture of left quadriceps tendon   COPD (chronic obstructive pulmonary disease)  (HCC) COPD Chronic hypoxemic respiratory failure Estimated body mass index is 33.72 kg/m as calculated from the following:   Height as of this encounter: 5\' 10"  (1.778 m).   Weight as of this encounter: 106.6 kg. Advance diet Up with therapy   Up with therapy today. Continue to monitor O2 levels.  Chest x-ray has been ordered.  Patient with history of Chronic hypoxemic respiratory failure secondary to COPD. If able to keep O2 above 90% on 2L or less will plan for discharge home today with home O2. Care management to assist with discharge planning. Will monitor patient progress today.  DVT Prophylaxis - Lovenox Weight-Bearing as tolerated to Left leg with the knee brace locked in extension.  Raquel [place], PA-C [facility] Orthopaedic Surgery 02/16/2020, 8:11 AM

## 2020-02-17 NOTE — TOC Transition Note (Signed)
Transition of Care [facility]) - CM/SW Discharge Note   Patient Details  Name: John [name] MRN: [id number] Date of Birth: 02/02/1946  Transition of Care [facility]) CM/SW Contact:  Su Hilt, RN Phone Number: 02/17/2020, 1:14 PM   Clinical Narrative:     Went in the room and did teaching again on how to use the [employer] and the Portable tank, RW in the room, Patient ready to DC  Final next level of care: [place] Barriers to Discharge: Barriers Resolved   Patient Goals and CMS Choice Patient states their goals for this hospitalization and ongoing recovery are:: go home      Discharge Placement                       Discharge Plan and Services   Discharge Planning Services: CM Consult            DME Arranged: [place] Rile rolling DME Agency: [employer] Date DME Agency Contacted: 02/17/20 Time DME Agency Contacted: 51 Representative spoke with at DME Agency: [place]: PT [place]: [facility] [employer] Date [place]: 02/16/20 Time [place]: [phone] Representative spoke with at [place]: [place] ([place]) Interventions     Readmission Risk Interventions No flowsheet data found.

## 2020-02-17 NOTE — Discharge Summary (Signed)
Physician Discharge Summary  Subjective: 2 Days Post-Op Procedure(s) (LRB): REPAIR QUADRICEP TENDON (Left) Patient reports pain as mild.   Patient seen in rounds with Dr. Roland Rack. Patient is well, and has had no acute complaints or problems Patient is ready to go home with home health physical therapy.  Physician Discharge Summary  Patient ID: John [name] MRN: [id number] DOB/AGE: 1946-03-01 74 y.o.  Admit date: 02/15/2020 Discharge date: 02/17/2020  Admission Diagnoses:  Discharge Diagnoses:  Active Problems:   Rupture of left quadriceps tendon   COPD (chronic obstructive pulmonary disease) ([place])   Discharged Condition: fair  Hospital Course: The patient is postop day 2 from a left quadriceps tendon rupture repair.  The patient is in a range of motion brace locked in extension.  He is in physical therapy and ambulated 100 feet.  He was Initially for observation because of decreased oxygen saturation.  His oxygen saturation was dropping around 60% with physical therapy.  He is on room air and has improved to 90% on room air.  The patient is ready to go home on oxygen after physical therapy and a bowel movement today.  His vitals have remained stable.  Treatments: surgery:  Primary repair of left quadriceps tendon rupture.  Surgeon:   Pascal Lux, MD  Assistant:   Cameron Proud, PA-C  Anesthesia:   General LMA --> GET  Findings:   As above.  Complications:   None  Fluids:   900 cc crystalloid  EBL:   25 cc  TT:   75 minutes at 300 mmHg  Drains:   None  Closure:   Staples  Implants:   [employer] 5 mm titanium corkscrew anchors x3   Discharge Exam: Blood pressure (!) 158/94, pulse (!) 102, temperature 99.1 F (37.3 C), temperature source Oral, resp. rate 18, height 5\' 10"  (1.778 m), weight 106.6 kg, SpO2 96 %.   Disposition: Discharge disposition: 01-Home or Self Care        Allergies as of 02/17/2020   No Known Allergies      Medication List    TAKE these medications   aspirin EC 325 MG tablet Take 1 tablet (325 mg total) by mouth daily.   cyclobenzaprine 5 MG tablet Commonly known as: FLEXERIL Take 5 mg by mouth 2 (two) times daily as needed for muscle spasms.   oxyCODONE 5 MG immediate release tablet Commonly known as: Oxy IR/ROXICODONE Take 1-2 tablets (5-10 mg total) by mouth every 4 (four) hours as needed for severe pain (pain score 7-10).   traMADol 50 MG tablet Commonly known as: ULTRAM Take 50 mg by mouth 3 (three) times daily as needed for pain.            Durable Medical Equipment  (From admission, onward)         Start     Ordered   02/15/20 1344  For home use only DME Walker rolling  Once    Question Answer Comment  Walker: With 5 Inch Wheels   Patient needs a walker to treat with the following condition Rupture of left quadriceps tendon      02/15/20 1343         Follow-up Information    Poggi, Marshall Cork, MD Follow up.   Specialty: Orthopedic Surgery Why: Call in AM to schedule follow up appointment for 10- 14 days Contact information: [place] [address] [phone]           Signed:  Jesilyn Easom 02/17/2020, 7:17 AM   Objective: Vital signs in last 24 hours: Temp:  [98 F (36.7 C)-99.1 F (37.3 C)] 99.1 F (37.3 C) (03/04 0053) Pulse Rate:  [87-117] 102 (03/04 0053) Resp:  [16-18] 18 (03/04 0053) BP: (131-158)/(88-111) 158/94 (03/04 0053) SpO2:  [89 %-97 %] 96 % (03/04 0053)  Intake/Output from previous day:  Intake/Output Summary (Last 24 hours) at 02/17/2020 0717 Last data filed at 02/17/2020 0500 Gross per 24 hour  Intake 840 ml  Output 750 ml  Net 90 ml    Intake/Output this shift: No intake/output data recorded.  Labs: Recent Labs    02/15/20 1034  HGB 16.0   Recent Labs    02/15/20 1034  HCT 47.0   Recent Labs    02/15/20 1034  NA 141  K 3.8  CL 102  BUN 13  CREATININE 1.30*   GLUCOSE 121*   No results for input(s): LABPT, INR in the last 72 hours.  EXAM: General - Patient is Alert and Oriented Extremity - Neurovascular intact Sensation intact distally Compartment soft Incision - clean, dry, scant drainage Motor Function -plantarflexion and dorsiflexion of the ankle and foot is intact.  Ambulated 100 feet in a locked brace involving the left knee that is locked in extension.  Assessment/Plan: 2 Days Post-Op Procedure(s) (LRB): REPAIR QUADRICEP TENDON (Left) Procedure(s) (LRB): REPAIR QUADRICEP TENDON (Left) Past Medical History:  Diagnosis Date  . Hypertension    Active Problems:   Rupture of left quadriceps tendon   COPD (chronic obstructive pulmonary disease) (HCC)  Estimated body mass index is 33.72 kg/m as calculated from the following:   Height as of this encounter: 5\' 10"  (1.778 m).   Weight as of this encounter: 106.6 kg. Advance diet Up with therapy D/C IV fluids Diet - Regular diet Follow up - in 2 weeks Activity - WBAT with the range of motion brace locked in extension. Disposition - Home Condition Upon Discharge - Stable DVT Prophylaxis - Lovenox  Reche Dixon, PA-C Orthopaedic Surgery 02/17/2020, 7:17 AM

## 2020-02-17 NOTE — TOC Transition Note (Signed)
Transition of Care [facility]) - CM/SW Discharge Note   Patient Details  Name: John [name] MRN: [id number] Date of Birth: Dec 04, 1946  [facility]) CM/SW Contact:  Su Hilt, RN Phone Number: 02/17/2020, 8:54 AM   Clinical Narrative:    The patient will DC home today with [facility], He will go home with Home O2 that is in the room, he has been taught how to turn on the condenser and the portable tank. He has all other DME at home, He has no additional needs   Final next level of care: [place] Barriers to Discharge: Barriers Resolved   Patient Goals and CMS Choice Patient states their goals for this hospitalization and ongoing recovery are:: go home      Discharge Placement                       Discharge Plan and Services   Discharge Planning Services: CM Consult            DME Arranged: Oxygen DME Agency: [employer] Date DME Agency Contacted: 02/16/20 Time DME Agency Contacted: [phone] Representative spoke with at DME Agency: [place]: PT [place]: [facility] [employer] Date [place]: 02/16/20 Time [place]: [phone] Representative spoke with at [place]: [place] ([place]) Interventions     Readmission Risk Interventions No flowsheet data found.

## 2020-02-17 NOTE — Progress Notes (Signed)
Subjective: 2 Days Post-Op Procedure(s) (LRB): REPAIR QUADRICEP TENDON (Left) Patient reports pain as mild.   Patient is reports pain as mild to the left leg and reports no issues however patient hypoxic after surgery, improving on keeping O2 sats above 90%. Plan is to go Home after hospital stay. Negative for chest pain and shortness of breath Fever: no Gastrointestinal:Negative for nausea and vomiting  Objective: Vital signs in last 24 hours: Temp:  [98 F (36.7 C)-99.1 F (37.3 C)] 99.1 F (37.3 C) (03/04 0053) Pulse Rate:  [87-117] 102 (03/04 0053) Resp:  [16-18] 18 (03/04 0053) BP: (131-158)/(88-111) 158/94 (03/04 0053) SpO2:  [89 %-97 %] 96 % (03/04 0053)  Intake/Output from previous day:  Intake/Output Summary (Last 24 hours) at 02/17/2020 0714 Last data filed at 02/17/2020 0500 Gross per 24 hour  Intake 840 ml  Output 750 ml  Net 90 ml    Intake/Output this shift: No intake/output data recorded.  Labs: Recent Labs    02/15/20 1034  HGB 16.0   Recent Labs    02/15/20 1034  HCT 47.0   Recent Labs    02/15/20 1034  NA 141  K 3.8  CL 102  BUN 13  CREATININE 1.30*  GLUCOSE 121*   No results for input(s): LABPT, INR in the last 72 hours.   EXAM General - Patient is Alert, Appropriate and Oriented  Lungs: Normal respiratory effort.  Intact lung sounds bilaterally. Extremity - Sensation intact distally Intact pulses distally Dorsiflexion/Plantar flexion intact Incision: dressing C/D/I Dressing/Incision - clean, dry, no drainage, left knee brace is intact, locked in extension.  The Ace wrap was removed with the honeycomb dressing showing mild drainage. Motor Function - intact, moving foot and toes well on exam.  Ambulated 100 feet with physical therapy.  Past Medical History:  Diagnosis Date  . Hypertension     Assessment/Plan: 2 Days Post-Op Procedure(s) (LRB): REPAIR QUADRICEP TENDON (Left) Active Problems:   Rupture of left quadriceps tendon   COPD (chronic obstructive pulmonary disease) (HCC) COPD Chronic hypoxemic respiratory failure Estimated body mass index is 33.72 kg/m as calculated from the following:   Height as of this encounter: 5\' 10"  (1.778 m).   Weight as of this encounter: 106.6 kg. Advance diet Up with therapy   Up with therapy today. Continue to monitor O2 levels. Patient with history of Chronic hypoxemic respiratory failure secondary to COPD. If able to keep O2 above 90% on 2L or less will plan for discharge home today with home O2. Care management to assist with discharge planning home today.  DVT Prophylaxis - Lovenox Weight-Bearing as tolerated to Left leg with the knee brace locked in extension.  Reche Dixon [facility] Orthopaedic Surgery 02/17/2020, 7:14 AM

## 2020-02-17 NOTE — TOC Progression Note (Signed)
Transition of Care [facility]) - Progression Note    Patient Details  Name: John [name] MRN: [id number] Date of Birth: 10-17-1946  Transition of Care [facility]) CM/SW Contact  Su Hilt, RN Phone Number: 02/17/2020, 10:56 AM  Clinical Narrative:    Patient determined he now needs a RW , I notified Brad with [employer] of the need, he is to DC today   Expected Discharge Plan: [place] Barriers to Discharge: Barriers Resolved  Expected Discharge Plan and Services Expected Discharge Plan: [place]   Discharge Planning Services: CM Consult   Living arrangements for the past 2 months: Single Family Home Expected Discharge Date: 02/17/20               DME Arranged: Gilford Rile rolling DME Agency: [employer] Date DME Agency Contacted: 02/17/20 Time DME Agency Contacted: 75 Representative spoke with at DME Agency: [place]: PT [place]: [facility] [employer] Date [place]: 02/16/20 Time [place]: [phone] Representative spoke with at [place]: [place] ([place]) Interventions    Readmission Risk Interventions No flowsheet data found.

## 2020-02-17 NOTE — Progress Notes (Signed)
Physical Therapy Treatment Patient Details Name: John [name] MRN: [id number] DOB: 09-16-1946 Today's Date: 02/17/2020    History of Present Illness Pt is 24 male s/p L quadricep tendon repair. PMH of smoking, COPD, and HTN. Pt with some post op hypoxia requiring 2Lnc, adm on observation to monitor.    PT Comments    Pt alert, in recliner, denied pain. Pt with RW in room, stated he had tried using it, and liked using it, and now believes it will fit in his house. Pt on 3L throughout session via , spO2 >90% throughout session and mobility. Pt with improved safety/balance with RW during transfers/gait this session. Occasionally cued for use of RW. Pt first sit <> stand with RW pt utilized window sill, demonstrated unsteadiness. second attempt performed to improve safety; Pt cued for RW use and hand placement, as well as anteriorly scooting in chair in prep for transfer.  Much improved balance/safety noted.  PT and pt also reviewed stair navigation, also provided with written instructions, pt verbalized understanding (had previously performed stairs during this hospital stay).Pt in chair with all needs in reach at end of session, no further questions. CM notified about need of RW prior to discharge as well as patient concerns about oxygen concentrator. The patient would benefit from further skilled PT intervention to continue to progress towards goals and PLOF.    Follow Up Recommendations  Home health PT;Supervision - Intermittent     Equipment Recommendations  Rolling walker with 5" wheels    Recommendations for Other Services       Precautions / Restrictions Precautions Precautions: Fall Required Braces or Orthoses: Knee Immobilizer - Left Knee Immobilizer - Left: On at all times(locked in extension) Restrictions Weight Bearing Restrictions: Yes LLE Weight Bearing: Weight bearing as tolerated Other Position/Activity Restrictions: monitor O2 with activity    Mobility  Bed  Mobility               General bed mobility comments: Pt received sitting up in chair when PT presents.  Transfers Overall transfer level: Needs assistance Equipment used: Rolling walker (2 wheeled) Transfers: Sit to/from Stand Sit to Stand: Min guard         General transfer comment: extended time to pull L LE back when weight shifting on sit to stand.  Ambulation/Gait Ambulation/Gait assistance: Supervision;Min guard Gait Distance (Feet): 110 Feet Assistive device: Rolling walker (2 wheeled)   Gait velocity: decreased   General Gait Details: Pt with improved safety with RW this session, as well as improved O2 sats, >90% on 3L throughout mobility. Occasionally cued for use of RW.   Stairs             Wheelchair Mobility    Modified Rankin (Stroke Patients Only)       Balance Overall balance assessment: Needs assistance Sitting-balance support: Feet supported Sitting balance-Leahy Scale: Fair     Standing balance support: Single extremity supported Standing balance-Leahy Scale: Fair                              Cognition Arousal/Alertness: Awake/alert Behavior During Therapy: WFL for tasks assessed/performed Overall Cognitive Status: Within Functional Limits for tasks assessed                                        Exercises Other Exercises Other Exercises: Oxygen status monitored  throughout session, on 3L via  >90%. Other Exercises: Pt and PT reviewed stair navigation, PT provided written instructions as well, pt verbalized understanding. Other Exercises: Pt first sit <> stand with RW pt utilized window sill, demonstrated unsteadiness. second attempt performed to improve safety; Pt cued for RW use and hand placement, as well as anteriorly scooting in chair in prep for transfer. Much improved balance/safety noted.    General Comments        Pertinent Vitals/Pain Pain Assessment: No/denies pain    Home Living                       Prior Function            PT Goals (current goals can now be found in the care plan section) Progress towards PT goals: Progressing toward goals    Frequency    7X/week      PT Plan Current plan remains appropriate;Equipment recommendations need to be updated    Co-evaluation              AM-PAC PT "6 Clicks" Mobility   Outcome Measure  Help needed turning from your back to your side while in a flat bed without using bedrails?: A Little Help needed moving from lying on your back to sitting on the side of a flat bed without using bedrails?: A Little Help needed moving to and from a bed to a chair (including a wheelchair)?: None Help needed standing up from a chair using your arms (e.g., wheelchair or bedside chair)?: A Little Help needed to walk in hospital room?: None Help needed climbing 3-5 steps with a railing? : None 6 Click Score: 21    End of Session Equipment Utilized During Treatment: Gait belt;Oxygen(3L) Activity Tolerance: Patient tolerated treatment well Patient left: in chair;Other (comment);with call bell/phone within reach;with chair alarm set Nurse Communication: Mobility status PT Visit Diagnosis: Other abnormalities of gait and mobility (R26.89);Difficulty in walking, not elsewhere classified (R26.2);Pain Pain - Right/Left: Left Pain - part of body: Knee     Time: 1020-1047 PT Time Calculation (min) (ACUTE ONLY): 27 min  Charges:  $Therapeutic Exercise: 23-37 mins                     Lieutenant Diego PT, DPT 11:25 AM,02/17/20

## 2020-02-18 DIAGNOSIS — S76112D Strain of left quadriceps muscle, fascia and tendon, subsequent encounter: Secondary | ICD-10-CM | POA: Diagnosis not present

## 2020-02-18 DIAGNOSIS — Z6834 Body mass index (BMI) 34.0-34.9, adult: Secondary | ICD-10-CM | POA: Diagnosis not present

## 2020-02-18 DIAGNOSIS — M76892 Other specified enthesopathies of left lower limb, excluding foot: Secondary | ICD-10-CM | POA: Diagnosis not present

## 2020-02-18 DIAGNOSIS — J9611 Chronic respiratory failure with hypoxia: Secondary | ICD-10-CM | POA: Diagnosis not present

## 2020-02-18 DIAGNOSIS — I10 Essential (primary) hypertension: Secondary | ICD-10-CM | POA: Diagnosis not present

## 2020-02-18 DIAGNOSIS — E785 Hyperlipidemia, unspecified: Secondary | ICD-10-CM | POA: Diagnosis not present

## 2020-02-18 DIAGNOSIS — J439 Emphysema, unspecified: Secondary | ICD-10-CM | POA: Diagnosis not present

## 2020-02-18 DIAGNOSIS — M1712 Unilateral primary osteoarthritis, left knee: Secondary | ICD-10-CM | POA: Diagnosis not present

## 2020-02-18 DIAGNOSIS — E669 Obesity, unspecified: Secondary | ICD-10-CM | POA: Diagnosis not present

## 2020-02-21 ENCOUNTER — Other Ambulatory Visit: Payer: Self-pay

## 2020-02-21 DIAGNOSIS — S76112D Strain of left quadriceps muscle, fascia and tendon, subsequent encounter: Secondary | ICD-10-CM | POA: Diagnosis not present

## 2020-02-21 NOTE — Patient Outreach (Addendum)
[place] [facility]) Care Management  02/21/2020  John [name] 1946/04/03 [id number]    EMMI-General Discharge RED ON EMMI ALERT Day # 1 Date: 02/19/2020 Red Alert Reason: "Unfilled prescriptions? Yes Other questions/problems? Yes"   Outreach attempt #1 to patient. Spoke with patient who denies any acute issues or concerns at present.RN CM dsicussed with patient that RN CM has been unable to reach patient for several months.  Reviewed and addressed red alerts with patient. He reports he does not recall receiving automated calls. He denies responding that way. He confirms that he has all his meds-no issues or concerns regarding them. He voices no other questions or concerns at present. He has not made ortho surgeon follow up appt as he thought office was to call him. Advised patient that per d/c paperwork he was instructed to call office. He has paperwork and will follow up with office. He goes for PCP appt in  2wks. (PCP does TOC) He has supportive "friend" in the home  to assist him and take him to appts. Patient states he was sent home with walker and oxygen. He is wearing the oxygen intermittently. He denies any SOB. States that "SOB was never the problem but that his oxygen level kept dropping." He does not have a pulse ox in the home and states he is unable to tell when oxygen level dropping. RN CM discussed some possible s/s to watch out for. Patient sounded upset about having to use oxygen and wondering how long. Advised patient to discuss with MD at appt. He voiced understanding and denies any other RN CM needs or concerns at present.   [facility] CM Care Plan Problem One     Most Recent Value  Care Plan Problem One  Risk for hospital readmission related to/ as evidenced by recent hospital visit September 16-18, 2020 for CAP  Role Documenting the Problem One  Care Management [place] for Problem One  Active  [facility] Long Term Goal   patient will have no hospital readmission  over the next 31 days.  [facility] Long Term Goal Start Date  02/21/20  Interventions for Problem One Long Term Goal  RN CM assessed for any acute issues or concerns. RN CM discussed s/s of worsening condition and when to seek medical attention.   [facility] CM Short Term Goal #1   Patient will complete all post discharge MD follow up appts within the next 30 days.  [facility] CM Short Term Goal #1 Start Date  02/21/20  Interventions for Short Term Goal #1  RN CM assessed for MD appts and instructed pt on making appts per d/c paperwork    [facility] CM Care Plan Problem Two     Most Recent Value  Care Plan Problem Two  Knowledge deficit related to mgmt of HTN as evidenced by unfamiliarity with info/education.  Role Documenting the Problem Two  Care Management Telephonic Coordinator  Care Plan for Problem Two  Not Active  [facility] CM Short Term Goal #2   Patient will report completing cardiology appt for further eval/workup over the next 30 days.  [facility] CM Short Term Goal #2 Start Date  11/15/19  [facility] CM Short Term Goal #2 Met Date  02/21/20  [facility] CM Short Term Goal #3   Patient will report decrease in BLE swelling over the next 30 days.  [facility] CM Short Term Goal #3 Start Date  11/15/19  [facility] CM Short Term Goal #3 Met Date  02/21/20  Plan: RN CM discussed with patient next outreach within the month of April. Patient gave verbal consent and in agreement with RN CM follow up and timeframe. Patient aware that they may contact RN CM sooner for any issues or concerns. RN CM will send PCP quarterly update.  [name], RN,BSN,CCM [place] Management Telephonic Care Management Coordinator Direct Phone: [phone] Toll Free: [phone] Fax: [phone]

## 2020-02-22 DIAGNOSIS — Z6834 Body mass index (BMI) 34.0-34.9, adult: Secondary | ICD-10-CM | POA: Diagnosis not present

## 2020-02-22 DIAGNOSIS — S76112D Strain of left quadriceps muscle, fascia and tendon, subsequent encounter: Secondary | ICD-10-CM | POA: Diagnosis not present

## 2020-02-22 DIAGNOSIS — J439 Emphysema, unspecified: Secondary | ICD-10-CM | POA: Diagnosis not present

## 2020-02-22 DIAGNOSIS — J9611 Chronic respiratory failure with hypoxia: Secondary | ICD-10-CM | POA: Diagnosis not present

## 2020-02-22 DIAGNOSIS — M76892 Other specified enthesopathies of left lower limb, excluding foot: Secondary | ICD-10-CM | POA: Diagnosis not present

## 2020-02-22 DIAGNOSIS — M1712 Unilateral primary osteoarthritis, left knee: Secondary | ICD-10-CM | POA: Diagnosis not present

## 2020-02-22 DIAGNOSIS — E785 Hyperlipidemia, unspecified: Secondary | ICD-10-CM | POA: Diagnosis not present

## 2020-02-22 DIAGNOSIS — I10 Essential (primary) hypertension: Secondary | ICD-10-CM | POA: Diagnosis not present

## 2020-02-22 DIAGNOSIS — E669 Obesity, unspecified: Secondary | ICD-10-CM | POA: Diagnosis not present

## 2020-02-24 DIAGNOSIS — E785 Hyperlipidemia, unspecified: Secondary | ICD-10-CM | POA: Diagnosis not present

## 2020-02-24 DIAGNOSIS — J9611 Chronic respiratory failure with hypoxia: Secondary | ICD-10-CM | POA: Diagnosis not present

## 2020-02-24 DIAGNOSIS — Z6834 Body mass index (BMI) 34.0-34.9, adult: Secondary | ICD-10-CM | POA: Diagnosis not present

## 2020-02-24 DIAGNOSIS — M76892 Other specified enthesopathies of left lower limb, excluding foot: Secondary | ICD-10-CM | POA: Diagnosis not present

## 2020-02-24 DIAGNOSIS — E669 Obesity, unspecified: Secondary | ICD-10-CM | POA: Diagnosis not present

## 2020-02-24 DIAGNOSIS — S76112D Strain of left quadriceps muscle, fascia and tendon, subsequent encounter: Secondary | ICD-10-CM | POA: Diagnosis not present

## 2020-02-24 DIAGNOSIS — J439 Emphysema, unspecified: Secondary | ICD-10-CM | POA: Diagnosis not present

## 2020-02-24 DIAGNOSIS — I10 Essential (primary) hypertension: Secondary | ICD-10-CM | POA: Diagnosis not present

## 2020-02-24 DIAGNOSIS — M1712 Unilateral primary osteoarthritis, left knee: Secondary | ICD-10-CM | POA: Diagnosis not present

## 2020-02-29 DIAGNOSIS — E669 Obesity, unspecified: Secondary | ICD-10-CM | POA: Diagnosis not present

## 2020-02-29 DIAGNOSIS — M76892 Other specified enthesopathies of left lower limb, excluding foot: Secondary | ICD-10-CM | POA: Diagnosis not present

## 2020-02-29 DIAGNOSIS — J439 Emphysema, unspecified: Secondary | ICD-10-CM | POA: Diagnosis not present

## 2020-02-29 DIAGNOSIS — J9611 Chronic respiratory failure with hypoxia: Secondary | ICD-10-CM | POA: Diagnosis not present

## 2020-02-29 DIAGNOSIS — M1712 Unilateral primary osteoarthritis, left knee: Secondary | ICD-10-CM | POA: Diagnosis not present

## 2020-02-29 DIAGNOSIS — E785 Hyperlipidemia, unspecified: Secondary | ICD-10-CM | POA: Diagnosis not present

## 2020-02-29 DIAGNOSIS — Z6834 Body mass index (BMI) 34.0-34.9, adult: Secondary | ICD-10-CM | POA: Diagnosis not present

## 2020-02-29 DIAGNOSIS — I10 Essential (primary) hypertension: Secondary | ICD-10-CM | POA: Diagnosis not present

## 2020-02-29 DIAGNOSIS — S76112D Strain of left quadriceps muscle, fascia and tendon, subsequent encounter: Secondary | ICD-10-CM | POA: Diagnosis not present

## 2020-03-01 ENCOUNTER — Telehealth: Payer: Self-pay

## 2020-03-01 NOTE — Telephone Encounter (Signed)
Patient is due for a 6 month follow up for CT angio to assess his aneurysm. He states that he would like to contact his primary care provider about getting this done as he does not want to be going to too many providers all doing the same tests. I let him know to call us if he would like to follow up here in the future.

## 2020-03-02 DIAGNOSIS — I10 Essential (primary) hypertension: Secondary | ICD-10-CM | POA: Diagnosis not present

## 2020-03-02 DIAGNOSIS — E785 Hyperlipidemia, unspecified: Secondary | ICD-10-CM | POA: Diagnosis not present

## 2020-03-02 DIAGNOSIS — S76112D Strain of left quadriceps muscle, fascia and tendon, subsequent encounter: Secondary | ICD-10-CM | POA: Diagnosis not present

## 2020-03-02 DIAGNOSIS — M76892 Other specified enthesopathies of left lower limb, excluding foot: Secondary | ICD-10-CM | POA: Diagnosis not present

## 2020-03-02 DIAGNOSIS — J9611 Chronic respiratory failure with hypoxia: Secondary | ICD-10-CM | POA: Diagnosis not present

## 2020-03-02 DIAGNOSIS — Z6834 Body mass index (BMI) 34.0-34.9, adult: Secondary | ICD-10-CM | POA: Diagnosis not present

## 2020-03-02 DIAGNOSIS — M1712 Unilateral primary osteoarthritis, left knee: Secondary | ICD-10-CM | POA: Diagnosis not present

## 2020-03-02 DIAGNOSIS — E669 Obesity, unspecified: Secondary | ICD-10-CM | POA: Diagnosis not present

## 2020-03-02 DIAGNOSIS — J439 Emphysema, unspecified: Secondary | ICD-10-CM | POA: Diagnosis not present

## 2020-03-08 ENCOUNTER — Other Ambulatory Visit: Payer: Self-pay

## 2020-03-08 DIAGNOSIS — H25813 Combined forms of age-related cataract, bilateral: Secondary | ICD-10-CM | POA: Diagnosis not present

## 2020-03-08 DIAGNOSIS — H40153 Residual stage of open-angle glaucoma, bilateral: Secondary | ICD-10-CM | POA: Diagnosis not present

## 2020-03-08 DIAGNOSIS — H524 Presbyopia: Secondary | ICD-10-CM | POA: Diagnosis not present

## 2020-03-08 NOTE — Patient Outreach (Signed)
[place] [facility]) Care Management  03/08/2020  John [name] 07/25/1946 [id number]   Telephone Assessment    Outreach attempt to patient. No answer at present.      Plan: RN CM will make outreach attempt to patient within the month of April if no return call.    John Montgomery, RN,BSN,CCM [place] Management Telephonic Care Management Coordinator Direct Phone: [phone] Toll Free: [phone] Fax: [phone]

## 2020-03-09 DIAGNOSIS — E669 Obesity, unspecified: Secondary | ICD-10-CM | POA: Diagnosis not present

## 2020-03-09 DIAGNOSIS — Z6834 Body mass index (BMI) 34.0-34.9, adult: Secondary | ICD-10-CM | POA: Diagnosis not present

## 2020-03-09 DIAGNOSIS — J9611 Chronic respiratory failure with hypoxia: Secondary | ICD-10-CM | POA: Diagnosis not present

## 2020-03-09 DIAGNOSIS — M76892 Other specified enthesopathies of left lower limb, excluding foot: Secondary | ICD-10-CM | POA: Diagnosis not present

## 2020-03-09 DIAGNOSIS — M1712 Unilateral primary osteoarthritis, left knee: Secondary | ICD-10-CM | POA: Diagnosis not present

## 2020-03-09 DIAGNOSIS — J439 Emphysema, unspecified: Secondary | ICD-10-CM | POA: Diagnosis not present

## 2020-03-09 DIAGNOSIS — I10 Essential (primary) hypertension: Secondary | ICD-10-CM | POA: Diagnosis not present

## 2020-03-09 DIAGNOSIS — S76112D Strain of left quadriceps muscle, fascia and tendon, subsequent encounter: Secondary | ICD-10-CM | POA: Diagnosis not present

## 2020-03-09 DIAGNOSIS — E785 Hyperlipidemia, unspecified: Secondary | ICD-10-CM | POA: Diagnosis not present

## 2020-03-15 DIAGNOSIS — Z6834 Body mass index (BMI) 34.0-34.9, adult: Secondary | ICD-10-CM | POA: Diagnosis not present

## 2020-03-15 DIAGNOSIS — M76892 Other specified enthesopathies of left lower limb, excluding foot: Secondary | ICD-10-CM | POA: Diagnosis not present

## 2020-03-15 DIAGNOSIS — J439 Emphysema, unspecified: Secondary | ICD-10-CM | POA: Diagnosis not present

## 2020-03-15 DIAGNOSIS — E669 Obesity, unspecified: Secondary | ICD-10-CM | POA: Diagnosis not present

## 2020-03-15 DIAGNOSIS — E785 Hyperlipidemia, unspecified: Secondary | ICD-10-CM | POA: Diagnosis not present

## 2020-03-15 DIAGNOSIS — S76112D Strain of left quadriceps muscle, fascia and tendon, subsequent encounter: Secondary | ICD-10-CM | POA: Diagnosis not present

## 2020-03-15 DIAGNOSIS — J9611 Chronic respiratory failure with hypoxia: Secondary | ICD-10-CM | POA: Diagnosis not present

## 2020-03-15 DIAGNOSIS — M1712 Unilateral primary osteoarthritis, left knee: Secondary | ICD-10-CM | POA: Diagnosis not present

## 2020-03-15 DIAGNOSIS — I10 Essential (primary) hypertension: Secondary | ICD-10-CM | POA: Diagnosis not present

## 2020-03-19 DIAGNOSIS — S76112D Strain of left quadriceps muscle, fascia and tendon, subsequent encounter: Secondary | ICD-10-CM | POA: Diagnosis not present

## 2020-03-19 DIAGNOSIS — J9611 Chronic respiratory failure with hypoxia: Secondary | ICD-10-CM | POA: Diagnosis not present

## 2020-03-19 DIAGNOSIS — M76892 Other specified enthesopathies of left lower limb, excluding foot: Secondary | ICD-10-CM | POA: Diagnosis not present

## 2020-03-19 DIAGNOSIS — E785 Hyperlipidemia, unspecified: Secondary | ICD-10-CM | POA: Diagnosis not present

## 2020-03-19 DIAGNOSIS — I10 Essential (primary) hypertension: Secondary | ICD-10-CM | POA: Diagnosis not present

## 2020-03-19 DIAGNOSIS — E669 Obesity, unspecified: Secondary | ICD-10-CM | POA: Diagnosis not present

## 2020-03-19 DIAGNOSIS — Z6834 Body mass index (BMI) 34.0-34.9, adult: Secondary | ICD-10-CM | POA: Diagnosis not present

## 2020-03-19 DIAGNOSIS — M1712 Unilateral primary osteoarthritis, left knee: Secondary | ICD-10-CM | POA: Diagnosis not present

## 2020-03-19 DIAGNOSIS — J439 Emphysema, unspecified: Secondary | ICD-10-CM | POA: Diagnosis not present

## 2020-03-21 DIAGNOSIS — J439 Emphysema, unspecified: Secondary | ICD-10-CM | POA: Diagnosis not present

## 2020-03-21 DIAGNOSIS — E785 Hyperlipidemia, unspecified: Secondary | ICD-10-CM | POA: Diagnosis not present

## 2020-03-21 DIAGNOSIS — M76892 Other specified enthesopathies of left lower limb, excluding foot: Secondary | ICD-10-CM | POA: Diagnosis not present

## 2020-03-21 DIAGNOSIS — S76112D Strain of left quadriceps muscle, fascia and tendon, subsequent encounter: Secondary | ICD-10-CM | POA: Diagnosis not present

## 2020-03-21 DIAGNOSIS — I10 Essential (primary) hypertension: Secondary | ICD-10-CM | POA: Diagnosis not present

## 2020-03-21 DIAGNOSIS — E669 Obesity, unspecified: Secondary | ICD-10-CM | POA: Diagnosis not present

## 2020-03-21 DIAGNOSIS — M1712 Unilateral primary osteoarthritis, left knee: Secondary | ICD-10-CM | POA: Diagnosis not present

## 2020-03-21 DIAGNOSIS — Z6834 Body mass index (BMI) 34.0-34.9, adult: Secondary | ICD-10-CM | POA: Diagnosis not present

## 2020-03-21 DIAGNOSIS — J9611 Chronic respiratory failure with hypoxia: Secondary | ICD-10-CM | POA: Diagnosis not present

## 2020-03-27 DIAGNOSIS — J439 Emphysema, unspecified: Secondary | ICD-10-CM | POA: Diagnosis not present

## 2020-03-27 DIAGNOSIS — M1712 Unilateral primary osteoarthritis, left knee: Secondary | ICD-10-CM | POA: Diagnosis not present

## 2020-03-27 DIAGNOSIS — S76112D Strain of left quadriceps muscle, fascia and tendon, subsequent encounter: Secondary | ICD-10-CM | POA: Diagnosis not present

## 2020-03-27 DIAGNOSIS — E785 Hyperlipidemia, unspecified: Secondary | ICD-10-CM | POA: Diagnosis not present

## 2020-03-27 DIAGNOSIS — M76892 Other specified enthesopathies of left lower limb, excluding foot: Secondary | ICD-10-CM | POA: Diagnosis not present

## 2020-03-27 DIAGNOSIS — E669 Obesity, unspecified: Secondary | ICD-10-CM | POA: Diagnosis not present

## 2020-03-27 DIAGNOSIS — Z6834 Body mass index (BMI) 34.0-34.9, adult: Secondary | ICD-10-CM | POA: Diagnosis not present

## 2020-03-27 DIAGNOSIS — I10 Essential (primary) hypertension: Secondary | ICD-10-CM | POA: Diagnosis not present

## 2020-03-27 DIAGNOSIS — J9611 Chronic respiratory failure with hypoxia: Secondary | ICD-10-CM | POA: Diagnosis not present

## 2020-04-04 ENCOUNTER — Other Ambulatory Visit: Payer: Self-pay

## 2020-04-04 NOTE — Patient Outreach (Signed)
[place] [facility]) Care Management  04/04/2020  John [name] Feb 17, 1946 [id number]   Telephone Assessment    Outreach attempt to patient. No answer after several rings.     Plan: RN CM will will make outreach attempt to patient within the month of June.   [name], RN,BSN,CCM [place] Management Telephonic Care Management Coordinator Direct Phone: [phone] Toll Free: [phone] Fax: [phone]

## 2020-04-05 DIAGNOSIS — M76892 Other specified enthesopathies of left lower limb, excluding foot: Secondary | ICD-10-CM | POA: Diagnosis not present

## 2020-04-05 DIAGNOSIS — J439 Emphysema, unspecified: Secondary | ICD-10-CM | POA: Diagnosis not present

## 2020-04-05 DIAGNOSIS — S76112D Strain of left quadriceps muscle, fascia and tendon, subsequent encounter: Secondary | ICD-10-CM | POA: Diagnosis not present

## 2020-04-05 DIAGNOSIS — E785 Hyperlipidemia, unspecified: Secondary | ICD-10-CM | POA: Diagnosis not present

## 2020-04-05 DIAGNOSIS — M1712 Unilateral primary osteoarthritis, left knee: Secondary | ICD-10-CM | POA: Diagnosis not present

## 2020-04-05 DIAGNOSIS — Z6834 Body mass index (BMI) 34.0-34.9, adult: Secondary | ICD-10-CM | POA: Diagnosis not present

## 2020-04-05 DIAGNOSIS — E669 Obesity, unspecified: Secondary | ICD-10-CM | POA: Diagnosis not present

## 2020-04-05 DIAGNOSIS — J9611 Chronic respiratory failure with hypoxia: Secondary | ICD-10-CM | POA: Diagnosis not present

## 2020-04-05 DIAGNOSIS — I10 Essential (primary) hypertension: Secondary | ICD-10-CM | POA: Diagnosis not present

## 2020-04-13 DIAGNOSIS — S76112D Strain of left quadriceps muscle, fascia and tendon, subsequent encounter: Secondary | ICD-10-CM | POA: Diagnosis not present

## 2020-04-13 DIAGNOSIS — M25662 Stiffness of left knee, not elsewhere classified: Secondary | ICD-10-CM | POA: Diagnosis not present

## 2020-04-13 DIAGNOSIS — R29898 Other symptoms and signs involving the musculoskeletal system: Secondary | ICD-10-CM | POA: Diagnosis not present

## 2020-04-17 DIAGNOSIS — S76112D Strain of left quadriceps muscle, fascia and tendon, subsequent encounter: Secondary | ICD-10-CM | POA: Diagnosis not present

## 2020-04-20 DIAGNOSIS — S76112D Strain of left quadriceps muscle, fascia and tendon, subsequent encounter: Secondary | ICD-10-CM | POA: Diagnosis not present

## 2020-04-27 DIAGNOSIS — S76112D Strain of left quadriceps muscle, fascia and tendon, subsequent encounter: Secondary | ICD-10-CM | POA: Diagnosis not present

## 2020-05-02 DIAGNOSIS — S76112D Strain of left quadriceps muscle, fascia and tendon, subsequent encounter: Secondary | ICD-10-CM | POA: Diagnosis not present

## 2020-05-11 DIAGNOSIS — S76112D Strain of left quadriceps muscle, fascia and tendon, subsequent encounter: Secondary | ICD-10-CM | POA: Diagnosis not present

## 2020-05-18 DIAGNOSIS — S76112D Strain of left quadriceps muscle, fascia and tendon, subsequent encounter: Secondary | ICD-10-CM | POA: Diagnosis not present

## 2020-05-19 DIAGNOSIS — S76112D Strain of left quadriceps muscle, fascia and tendon, subsequent encounter: Secondary | ICD-10-CM | POA: Diagnosis not present

## 2020-05-25 DIAGNOSIS — S76112D Strain of left quadriceps muscle, fascia and tendon, subsequent encounter: Secondary | ICD-10-CM | POA: Diagnosis not present

## 2020-06-06 ENCOUNTER — Other Ambulatory Visit: Payer: Self-pay

## 2020-06-06 DIAGNOSIS — Z6833 Body mass index (BMI) 33.0-33.9, adult: Secondary | ICD-10-CM | POA: Diagnosis not present

## 2020-06-06 DIAGNOSIS — I712 Thoracic aortic aneurysm, without rupture: Secondary | ICD-10-CM | POA: Diagnosis not present

## 2020-06-06 DIAGNOSIS — Z87891 Personal history of nicotine dependence: Secondary | ICD-10-CM | POA: Diagnosis not present

## 2020-06-06 DIAGNOSIS — I700 Atherosclerosis of aorta: Secondary | ICD-10-CM | POA: Diagnosis not present

## 2020-06-06 DIAGNOSIS — E782 Mixed hyperlipidemia: Secondary | ICD-10-CM | POA: Diagnosis not present

## 2020-06-06 DIAGNOSIS — J432 Centrilobular emphysema: Secondary | ICD-10-CM | POA: Diagnosis not present

## 2020-06-06 DIAGNOSIS — I10 Essential (primary) hypertension: Secondary | ICD-10-CM | POA: Diagnosis not present

## 2020-06-06 NOTE — Patient Outreach (Signed)
[place] [facility]) Care Management  06/06/2020  John [name] [birth year] [id number]   Telephone Assessment Quarterly Call    Unsuccessful outreach attempt to patient.      Plan: RN CM will make quarterly outreach attempt to patient within the month of Sept.   [name] [place] Management Telephonic Care Management Coordinator Direct Phone: [phone] Toll Free: [phone] Fax: [phone]

## 2020-06-08 DIAGNOSIS — S76112D Strain of left quadriceps muscle, fascia and tendon, subsequent encounter: Secondary | ICD-10-CM | POA: Diagnosis not present

## 2020-06-13 ENCOUNTER — Other Ambulatory Visit (HOSPITAL_COMMUNITY): Payer: Self-pay | Admitting: Internal Medicine

## 2020-06-13 ENCOUNTER — Ambulatory Visit: Payer: Medicare HMO

## 2020-06-13 ENCOUNTER — Other Ambulatory Visit: Payer: Self-pay | Admitting: Internal Medicine

## 2020-06-13 DIAGNOSIS — Z9981 Dependence on supplemental oxygen: Secondary | ICD-10-CM | POA: Diagnosis not present

## 2020-06-13 DIAGNOSIS — Z79899 Other long term (current) drug therapy: Secondary | ICD-10-CM | POA: Diagnosis not present

## 2020-06-13 DIAGNOSIS — J449 Chronic obstructive pulmonary disease, unspecified: Secondary | ICD-10-CM | POA: Diagnosis not present

## 2020-06-13 DIAGNOSIS — I129 Hypertensive chronic kidney disease with stage 1 through stage 4 chronic kidney disease, or unspecified chronic kidney disease: Secondary | ICD-10-CM | POA: Diagnosis not present

## 2020-06-13 DIAGNOSIS — I712 Thoracic aortic aneurysm, without rupture: Secondary | ICD-10-CM | POA: Diagnosis not present

## 2020-06-13 DIAGNOSIS — N189 Chronic kidney disease, unspecified: Secondary | ICD-10-CM | POA: Diagnosis not present

## 2020-06-13 DIAGNOSIS — Z0000 Encounter for general adult medical examination without abnormal findings: Secondary | ICD-10-CM | POA: Diagnosis not present

## 2020-06-13 DIAGNOSIS — R2243 Localized swelling, mass and lump, lower limb, bilateral: Secondary | ICD-10-CM

## 2020-06-13 DIAGNOSIS — J9611 Chronic respiratory failure with hypoxia: Secondary | ICD-10-CM | POA: Diagnosis not present

## 2020-06-13 DIAGNOSIS — I2510 Atherosclerotic heart disease of native coronary artery without angina pectoris: Secondary | ICD-10-CM | POA: Diagnosis not present

## 2020-06-14 ENCOUNTER — Ambulatory Visit
Admission: RE | Admit: 2020-06-14 | Discharge: 2020-06-14 | Disposition: A | Discharge status: Home / Self Care | Payer: Medicare HMO | Source: Ambulatory Visit | Attending: Internal Medicine | Admitting: Internal Medicine

## 2020-06-14 ENCOUNTER — Other Ambulatory Visit: Payer: Self-pay

## 2020-06-14 ENCOUNTER — Other Ambulatory Visit: Payer: Self-pay | Admitting: Interventional Radiology

## 2020-06-14 DIAGNOSIS — M7989 Other specified soft tissue disorders: Secondary | ICD-10-CM | POA: Diagnosis not present

## 2020-06-14 DIAGNOSIS — R2243 Localized swelling, mass and lump, lower limb, bilateral: Secondary | ICD-10-CM | POA: Diagnosis not present

## 2020-06-14 DIAGNOSIS — R600 Localized edema: Secondary | ICD-10-CM | POA: Diagnosis not present

## 2020-06-21 ENCOUNTER — Other Ambulatory Visit: Payer: Self-pay

## 2020-06-21 ENCOUNTER — Encounter: Payer: Self-pay | Admitting: Emergency Medicine

## 2020-06-21 ENCOUNTER — Inpatient Hospital Stay
Admission: EM | Admit: 2020-06-21 | Discharge: 2020-07-03 | DRG: 291 | Disposition: A | Discharge status: Home / Self Care | Payer: Medicare HMO | Attending: Internal Medicine | Admitting: Internal Medicine

## 2020-06-21 ENCOUNTER — Emergency Department: Payer: Medicare HMO

## 2020-06-21 DIAGNOSIS — Z87891 Personal history of nicotine dependence: Secondary | ICD-10-CM

## 2020-06-21 DIAGNOSIS — Z79899 Other long term (current) drug therapy: Secondary | ICD-10-CM

## 2020-06-21 DIAGNOSIS — D7589 Other specified diseases of blood and blood-forming organs: Secondary | ICD-10-CM | POA: Diagnosis present

## 2020-06-21 DIAGNOSIS — E785 Hyperlipidemia, unspecified: Secondary | ICD-10-CM | POA: Diagnosis present

## 2020-06-21 DIAGNOSIS — R609 Edema, unspecified: Secondary | ICD-10-CM

## 2020-06-21 DIAGNOSIS — Z03818 Encounter for observation for suspected exposure to other biological agents ruled out: Secondary | ICD-10-CM | POA: Diagnosis not present

## 2020-06-21 DIAGNOSIS — N5089 Other specified disorders of the male genital organs: Secondary | ICD-10-CM

## 2020-06-21 DIAGNOSIS — Z20822 Contact with and (suspected) exposure to covid-19: Secondary | ICD-10-CM | POA: Diagnosis present

## 2020-06-21 DIAGNOSIS — I130 Hypertensive heart and chronic kidney disease with heart failure and stage 1 through stage 4 chronic kidney disease, or unspecified chronic kidney disease: Principal | ICD-10-CM | POA: Diagnosis present

## 2020-06-21 DIAGNOSIS — I10 Essential (primary) hypertension: Secondary | ICD-10-CM | POA: Diagnosis not present

## 2020-06-21 DIAGNOSIS — I5043 Acute on chronic combined systolic (congestive) and diastolic (congestive) heart failure: Secondary | ICD-10-CM | POA: Diagnosis present

## 2020-06-21 DIAGNOSIS — I110 Hypertensive heart disease with heart failure: Secondary | ICD-10-CM | POA: Diagnosis not present

## 2020-06-21 DIAGNOSIS — Z7982 Long term (current) use of aspirin: Secondary | ICD-10-CM

## 2020-06-21 DIAGNOSIS — R17 Unspecified jaundice: Secondary | ICD-10-CM

## 2020-06-21 DIAGNOSIS — E873 Alkalosis: Secondary | ICD-10-CM | POA: Diagnosis present

## 2020-06-21 DIAGNOSIS — J449 Chronic obstructive pulmonary disease, unspecified: Secondary | ICD-10-CM | POA: Diagnosis present

## 2020-06-21 DIAGNOSIS — J9621 Acute and chronic respiratory failure with hypoxia: Secondary | ICD-10-CM | POA: Diagnosis not present

## 2020-06-21 DIAGNOSIS — T502X5A Adverse effect of carbonic-anhydrase inhibitors, benzothiadiazides and other diuretics, initial encounter: Secondary | ICD-10-CM | POA: Diagnosis not present

## 2020-06-21 DIAGNOSIS — I714 Abdominal aortic aneurysm, without rupture: Secondary | ICD-10-CM | POA: Diagnosis present

## 2020-06-21 DIAGNOSIS — N1831 Chronic kidney disease, stage 3a: Secondary | ICD-10-CM | POA: Diagnosis not present

## 2020-06-21 DIAGNOSIS — M7989 Other specified soft tissue disorders: Secondary | ICD-10-CM | POA: Diagnosis not present

## 2020-06-21 DIAGNOSIS — I272 Pulmonary hypertension, unspecified: Secondary | ICD-10-CM | POA: Diagnosis present

## 2020-06-21 DIAGNOSIS — E876 Hypokalemia: Secondary | ICD-10-CM | POA: Diagnosis not present

## 2020-06-21 DIAGNOSIS — I2510 Atherosclerotic heart disease of native coronary artery without angina pectoris: Secondary | ICD-10-CM | POA: Diagnosis not present

## 2020-06-21 DIAGNOSIS — K8020 Calculus of gallbladder without cholecystitis without obstruction: Secondary | ICD-10-CM | POA: Diagnosis present

## 2020-06-21 DIAGNOSIS — N179 Acute kidney failure, unspecified: Secondary | ICD-10-CM

## 2020-06-21 DIAGNOSIS — J9601 Acute respiratory failure with hypoxia: Secondary | ICD-10-CM | POA: Diagnosis not present

## 2020-06-21 DIAGNOSIS — I5031 Acute diastolic (congestive) heart failure: Secondary | ICD-10-CM | POA: Diagnosis not present

## 2020-06-21 DIAGNOSIS — I509 Heart failure, unspecified: Secondary | ICD-10-CM | POA: Diagnosis not present

## 2020-06-21 DIAGNOSIS — N433 Hydrocele, unspecified: Secondary | ICD-10-CM | POA: Diagnosis not present

## 2020-06-21 DIAGNOSIS — N1832 Chronic kidney disease, stage 3b: Secondary | ICD-10-CM | POA: Diagnosis not present

## 2020-06-21 DIAGNOSIS — I5022 Chronic systolic (congestive) heart failure: Secondary | ICD-10-CM | POA: Diagnosis not present

## 2020-06-21 DIAGNOSIS — I861 Scrotal varices: Secondary | ICD-10-CM | POA: Diagnosis not present

## 2020-06-21 DIAGNOSIS — Z9114 Patient's other noncompliance with medication regimen: Secondary | ICD-10-CM

## 2020-06-21 DIAGNOSIS — R601 Generalized edema: Secondary | ICD-10-CM | POA: Diagnosis not present

## 2020-06-21 DIAGNOSIS — J9811 Atelectasis: Secondary | ICD-10-CM | POA: Diagnosis not present

## 2020-06-21 DIAGNOSIS — I5021 Acute systolic (congestive) heart failure: Secondary | ICD-10-CM | POA: Diagnosis not present

## 2020-06-21 DIAGNOSIS — I5041 Acute combined systolic (congestive) and diastolic (congestive) heart failure: Secondary | ICD-10-CM | POA: Diagnosis not present

## 2020-06-21 DIAGNOSIS — I129 Hypertensive chronic kidney disease with stage 1 through stage 4 chronic kidney disease, or unspecified chronic kidney disease: Secondary | ICD-10-CM | POA: Diagnosis not present

## 2020-06-21 LAB — SARS CORONAVIRUS 2 BY RT PCR (HOSPITAL ORDER, PERFORMED IN ~~LOC~~ HOSPITAL LAB): SARS Coronavirus 2: NEGATIVE

## 2020-06-21 LAB — COMPREHENSIVE METABOLIC PANEL
ALT: 39 U/L (ref 0–44)
AST: 30 U/L (ref 15–41)
Albumin: 3.4 g/dL — ABNORMAL LOW (ref 3.5–5.0)
Alkaline Phosphatase: 61 U/L (ref 38–126)
Anion gap: 11 (ref 5–15)
BUN: 34 mg/dL — ABNORMAL HIGH (ref 8–23)
CO2: 34 mmol/L — ABNORMAL HIGH (ref 22–32)
Calcium: 9 mg/dL (ref 8.9–10.3)
Chloride: 97 mmol/L — ABNORMAL LOW (ref 98–111)
Creatinine, Ser: 1.95 mg/dL — ABNORMAL HIGH (ref 0.61–1.24)
GFR calc Af Amer: 38 mL/min — ABNORMAL LOW (ref >60)
GFR calc non Af Amer: 33 mL/min — ABNORMAL LOW (ref >60)
Glucose, Bld: 119 mg/dL — ABNORMAL HIGH (ref 70–99)
Potassium: 4.2 mmol/L (ref 3.5–5.1)
Sodium: 142 mmol/L (ref 135–145)
Total Bilirubin: 1.6 mg/dL — ABNORMAL HIGH (ref 0.3–1.2)
Total Protein: 5.9 g/dL — ABNORMAL LOW (ref 6.5–8.1)

## 2020-06-21 LAB — URINALYSIS, COMPLETE (UACMP) WITH MICROSCOPIC
Bacteria, UA: NONE SEEN
Bilirubin Urine: NEGATIVE
Glucose, UA: NEGATIVE mg/dL
Hgb urine dipstick: NEGATIVE
Ketones, ur: NEGATIVE mg/dL
Leukocytes,Ua: NEGATIVE
Nitrite: NEGATIVE
Protein, ur: NEGATIVE mg/dL
Specific Gravity, Urine: 1.006 (ref 1.005–1.030)
pH: 5 (ref 5.0–8.0)

## 2020-06-21 LAB — CBC WITH DIFFERENTIAL/PLATELET
Abs Immature Granulocytes: 0.05 10*3/uL (ref 0.00–0.07)
Basophils Absolute: 0 10*3/uL (ref 0.0–0.1)
Basophils Relative: 0 %
Eosinophils Absolute: 0.1 10*3/uL (ref 0.0–0.5)
Eosinophils Relative: 1 %
HCT: 52.8 % — ABNORMAL HIGH (ref 39.0–52.0)
Hemoglobin: 16.5 g/dL (ref 13.0–17.0)
Immature Granulocytes: 1 %
Lymphocytes Relative: 15 %
Lymphs Abs: 1.4 10*3/uL (ref 0.7–4.0)
MCH: 32.3 pg (ref 26.0–34.0)
MCHC: 31.3 g/dL (ref 30.0–36.0)
MCV: 103.3 fL — ABNORMAL HIGH (ref 80.0–100.0)
Monocytes Absolute: 0.7 10*3/uL (ref 0.1–1.0)
Monocytes Relative: 8 %
Neutro Abs: 6.8 10*3/uL (ref 1.7–7.7)
Neutrophils Relative %: 75 %
Platelets: 142 10*3/uL — ABNORMAL LOW (ref 150–400)
RBC: 5.11 MIL/uL (ref 4.22–5.81)
RDW: 14.9 % (ref 11.5–15.5)
WBC: 9 10*3/uL (ref 4.0–10.5)
nRBC: 0 % (ref 0.0–0.2)

## 2020-06-21 LAB — TROPONIN I (HIGH SENSITIVITY)
Troponin I (High Sensitivity): 199 ng/L (ref <18)
Troponin I (High Sensitivity): 192 ng/L (ref <18)
Troponin I (High Sensitivity): 221 ng/L (ref <18)

## 2020-06-21 LAB — BRAIN NATRIURETIC PEPTIDE: B Natriuretic Peptide: 674.8 pg/mL — ABNORMAL HIGH (ref 0.0–100.0)

## 2020-06-21 LAB — MAGNESIUM: Magnesium: 2.1 mg/dL (ref 1.7–2.4)

## 2020-06-21 MED ORDER — METOPROLOL SUCCINATE ER 25 MG PO TB24
25.0000 mg | ORAL_TABLET | Freq: Every day | ORAL | Status: DC
  Administered 2020-06-22 – 2020-06-23 (×2): 25 mg via ORAL
  Filled 2020-06-21 (×3): qty 1

## 2020-06-21 MED ORDER — ASPIRIN EC 325 MG PO TBEC
325.0000 mg | DELAYED_RELEASE_TABLET | Freq: Every day | ORAL | Status: DC
  Administered 2020-06-22 – 2020-07-03 (×12): 325 mg via ORAL
  Filled 2020-06-21 (×13): qty 1

## 2020-06-21 MED ORDER — ACETAMINOPHEN 325 MG PO TABS: 650.0000 mg | ORAL_TABLET | ORAL | Status: DC | PRN

## 2020-06-21 MED ORDER — FUROSEMIDE 10 MG/ML IJ SOLN
80.0000 mg | Freq: Two times a day (BID) | INTRAMUSCULAR | Status: DC
  Administered 2020-06-21 – 2020-06-23 (×4): 80 mg via INTRAVENOUS
  Filled 2020-06-21 (×4): qty 8

## 2020-06-21 MED ORDER — ENOXAPARIN SODIUM 40 MG/0.4ML ~~LOC~~ SOLN
40.0000 mg | SUBCUTANEOUS | Status: DC
  Administered 2020-06-22 – 2020-07-02 (×11): 40 mg via SUBCUTANEOUS
  Filled 2020-06-21 (×11): qty 0.4

## 2020-06-21 MED ORDER — FUROSEMIDE 10 MG/ML IJ SOLN
80.0000 mg | Freq: Once | INTRAMUSCULAR | Status: AC
  Administered 2020-06-21: 80 mg via INTRAVENOUS
  Filled 2020-06-21: qty 8

## 2020-06-21 MED ORDER — SODIUM CHLORIDE 0.9 % IV SOLN
250.0000 mL | INTRAVENOUS | Status: DC | PRN
  Administered 2020-07-01: 250 mL via INTRAVENOUS

## 2020-06-21 MED ORDER — ONDANSETRON HCL 4 MG/2ML IJ SOLN: 4.0000 mg | Freq: Four times a day (QID) | INTRAMUSCULAR | Status: DC | PRN

## 2020-06-21 MED ORDER — SODIUM CHLORIDE 0.9% FLUSH: 3.0000 mL | INTRAVENOUS | Status: DC | PRN

## 2020-06-21 MED ORDER — LOSARTAN POTASSIUM 50 MG PO TABS: 100.0000 mg | ORAL_TABLET | Freq: Every day | ORAL | Status: DC

## 2020-06-21 MED ORDER — SODIUM CHLORIDE 0.9% FLUSH
3.0000 mL | Freq: Two times a day (BID) | INTRAVENOUS | Status: DC
  Administered 2020-06-22 – 2020-07-03 (×21): 3 mL via INTRAVENOUS

## 2020-06-21 NOTE — ED Notes (Signed)
Pt provided dinner tray.

## 2020-06-21 NOTE — ED Notes (Signed)
EDP [place] notified in person of trop 199.

## 2020-06-21 NOTE — ED Notes (Addendum)
Pt pulse ox moved from finger to ear. O2 sat now at 96% on RA.

## 2020-06-21 NOTE — ED Triage Notes (Signed)
C/O body swelling and scrotal swelling.  States swelling has been going on 'for a while' and scrotal swelling x 2 days.  Denies CP, SOB, DOE

## 2020-06-21 NOTE — ED Notes (Signed)
US at bedside

## 2020-06-21 NOTE — H&P (Signed)
History and Physical    John [name] IOE:[id number] DOB: 1946/07/04 DOA: 06/21/2020  PCP: Tracie Harrier, MD   Patient coming from: Home  I have personally briefly reviewed patient's old medical records in [place]  Chief Complaint: Generalized body swelling  HPI: John [name] is a 74 y.o. male with medical history significant for hypertension and coronary artery disease who presents to the emergency room for evaluation of generalized body swelling but mostly in the scrotal area and lower extremity.  Patient states that he has been compliant with his Lasix 80 mg daily without any improvement in the swelling.  He complains of difficulty voiding due to the scrotal swelling. He denies any dietary indiscretion. He denies having any chest pain, shortness of breath, denies having any dyspnea on exertion, nausea, vomiting, diaphoresis, palpitations, fever or chills, headache or any changes in his bowel habits. Scrotal ultrasound showed bilateral scrotal wall swelling  concerning for inflammation or edema. No definite evidence of testicular mass or torsion is noted. Small bilateral hydroceles are noted. Chest x-ray showed enlargement of cardiac silhouette with pulmonary vascular congestion. RIGHT basilar atelectasis.  ED Course: Patient presents for evaluation of scrotal swelling and worsening lower extremity swelling with no response to diuretic therapy. Patient will be admitted to the hospital for further evaluation.  Review of Systems: As per HPI otherwise 10 point review of systems negative.    Past Medical History:  Diagnosis Date  . Hypertension     Past Surgical History:  Procedure Laterality Date  . INGUINAL HERNIA REPAIR Right 02/04/2017   Procedure: HERNIA REPAIR INGUINAL ADULT;  Surgeon: Leonie Green, MD;  Location: [facility] ORS;  Service: General;  Laterality: Right;  . NO PAST SURGERIES    . QUADRICEPS TENDON REPAIR Left 02/15/2020   Procedure: REPAIR QUADRICEP  TENDON;  Surgeon: Corky Mull, MD;  Location: [facility] ORS;  Service: Orthopedics;  Laterality: Left;     reports that he quit smoking about 5 years ago. He has never used smokeless tobacco. He reports current alcohol use. He reports that he does not use drugs.  No Known Allergies  Family History  Problem Relation Age of Onset  . Diabetes Mother      Prior to Admission medications   Medication Sig Start Date End Date Taking? Authorizing Provider  aspirin 81 MG chewable tablet Chew 81 mg by mouth daily.   Yes [provider]  furosemide (LASIX) 80 MG tablet Take 80 mg by mouth daily. 06/13/20  Yes [provider]  losartan (COZAAR) 100 MG tablet Take 100 mg by mouth daily. 06/13/20  Yes [provider]  aspirin EC 325 MG tablet Take 1 tablet (325 mg total) by mouth daily. Patient not taking: Reported on 06/21/2020 02/16/20   Lattie Corns, PA-C  oxyCODONE (OXY IR/ROXICODONE) 5 MG immediate release tablet Take 1-2 tablets (5-10 mg total) by mouth every 4 (four) hours as needed for severe pain (pain score 7-10). Patient not taking: Reported on 06/21/2020 02/16/20   Lattie Corns, PA-C    Physical Exam: Vitals:   06/21/20 1200 06/21/20 1213 06/21/20 1230 06/21/20 1252  BP: 105/86  (!) 127/99   Pulse:  77  (!) 52  Resp: 14 20 17    Temp:      TempSrc:      SpO2:  96% 98%   Weight:      Height:         Vitals:   06/21/20 1200 06/21/20 1213 06/21/20 1230 06/21/20  1252  BP: 105/86  (!) 127/99   Pulse:  77  (!) 52  Resp: 14 20 17    Temp:      TempSrc:      SpO2:  96% 98%   Weight:      Height:        Constitutional: NAD, alert and oriented x 3 Eyes: PERRL, lids and conjunctivae normal ENMT: Mucous membranes are moist.  Neck: normal, supple, no masses, no thyromegaly Respiratory: Bilateral air entry, no wheezing, no crackles. Normal respiratory effort. No accessory muscle use.  Cardiovascular: Regular rate and rhythm, no murmurs / rubs / gallops.  3+ extremity edema. 2+ pedal pulses. No carotid bruits.  Abdomen: no tenderness, no masses palpated. No hepatosplenomegaly. Bowel sounds positive.  Musculoskeletal: no clubbing / cyanosis. No joint deformity upper and lower extremities.  Skin: no rashes, lesions, ulcers.  Neurologic: No gross focal neurologic deficit. Psychiatric: Normal mood and affect.   Labs on Admission: I have personally reviewed following labs and imaging studies  CBC: Recent Labs  Lab 06/21/20 0925  WBC 9.0  NEUTROABS 6.8  HGB 16.5  HCT 52.8*  MCV 103.3*  PLT 440*   Basic Metabolic Panel: Recent Labs  Lab 06/21/20 0925  NA 142  K 4.2  CL 97*  CO2 34*  GLUCOSE 119*  BUN 34*  CREATININE 1.95*  CALCIUM 9.0  MG 2.1   GFR: Estimated Creatinine Clearance: 42.2 mL/min (A) (by C-G formula based on SCr of 1.95 mg/dL (H)). Liver Function Tests: Recent Labs  Lab 06/21/20 0925  AST 30  ALT 39  ALKPHOS 61  BILITOT 1.6*  PROT 5.9*  ALBUMIN 3.4*   No results for input(s): LIPASE, AMYLASE in the last 168 hours. No results for input(s): AMMONIA in the last 168 hours. Coagulation Profile: No results for input(s): INR, PROTIME in the last 168 hours. Cardiac Enzymes: No results for input(s): CKTOTAL, CKMB, CKMBINDEX, TROPONINI in the last 168 hours. BNP (last 3 results) No results for input(s): PROBNP in the last 8760 hours. HbA1C: No results for input(s): HGBA1C in the last 72 hours. CBG: No results for input(s): GLUCAP in the last 168 hours. Lipid Profile: No results for input(s): CHOL, HDL, LDLCALC, TRIG, CHOLHDL, LDLDIRECT in the last 72 hours. Thyroid Function Tests: No results for input(s): TSH, T4TOTAL, FREET4, T3FREE, THYROIDAB in the last 72 hours. Anemia Panel: No results for input(s): VITAMINB12, FOLATE, FERRITIN, TIBC, IRON, RETICCTPCT in the last 72 hours. Urine analysis:    Component Value Date/Time   COLORURINE STRAW (A) 06/21/2020 0924   APPEARANCEUR CLEAR (A) 06/21/2020 0924     LABSPEC 1.006 06/21/2020 0924   PHURINE 5.0 06/21/2020 0924   GLUCOSEU NEGATIVE 06/21/2020 0924   HGBUR NEGATIVE 06/21/2020 0924   BILIRUBINUR NEGATIVE 06/21/2020 0924   KETONESUR NEGATIVE 06/21/2020 0924   PROTEINUR NEGATIVE 06/21/2020 0924   NITRITE NEGATIVE 06/21/2020 0924   LEUKOCYTESUR NEGATIVE 06/21/2020 0924    Radiological Exams on Admission: DG Chest Portable 1 View  Result Date: 06/21/2020 CLINICAL DATA:  Body swelling for awhile, scrotal swelling for 2 days, hypertension EXAM: PORTABLE CHEST 1 VIEW COMPARISON:  Portable exam 0944 hours compared to 02/16/2020 FINDINGS: Enlargement of cardiac silhouette with pulmonary vascular congestion. Mediastinal contours normal with mild atherosclerotic calcification aorta. RIGHT basilar atelectasis. Remaining lungs clear. No pleural effusion or pneumothorax. IMPRESSION: Enlargement of cardiac silhouette with pulmonary vascular congestion. RIGHT basilar atelectasis. Electronically Signed   By: Lavonia Dana M.D.   On: 06/21/2020 10:00   US  SCROTUM W/DOPPLER  Result Date: 06/21/2020 CLINICAL DATA:  Scrotal swelling for 2 months. EXAM: SCROTAL ULTRASOUND DOPPLER ULTRASOUND OF THE TESTICLES TECHNIQUE: Complete ultrasound examination of the testicles, epididymis, and other scrotal structures was performed. Color and spectral Doppler ultrasound were also utilized to evaluate blood flow to the testicles. COMPARISON:  None. FINDINGS: Right testicle Measurements: 3.4 x 2.8 x 2.5 cm. No mass or microlithiasis visualized. Left testicle Measurements: 3.3 x 2.7 x 2.6 cm. No mass or microlithiasis visualized. Right epididymis:  Normal in size and appearance. Left epididymis:  Normal in size and appearance. Hydrocele:  Small bilateral hydroceles are noted. Varicocele:  Small left varicocele may be present. Pulsed Doppler interrogation of both testes demonstrates normal low resistance arterial and venous waveforms bilaterally. Bilateral scrotal wall swelling is  noted. IMPRESSION: Bilateral scrotal wall swelling is noted concerning for inflammation or edema. No definite evidence of testicular mass or torsion is noted. Small bilateral hydroceles are noted. Electronically Signed   By: Marijo Conception M.D.   On: 06/21/2020 10:44    EKG: Independently reviewed.  Sinus rhythm Multiple PVC's  Assessment/Plan Principal Problem:   Acute CHF (congestive heart failure) (HCC) Active Problems:   Hypertension    Acute CHF Patient presents with anasarca and has worsening scrotal swelling and bilateral lower extremity Place patient on Furosemide 80mg  q 12 Obtain 2D Echocardiogram to assess LVEF Continue Cozaar and Metoprolol Check daily weights Maintain low sodium diet    Hypertension with complications of chronic kidney disease, stage 3 Continue Cozaar and Metoprolol Serum creatinine on admission is 1.95 compared to a baseline of 1.30    DVT prophylaxis:  Lovenox Code Status: Full Code Family Communication:  Greater than 50% of time was spent discussing plan of care with patient at the bedside. All questions and concerns have been addressed. He verbalized understanding and agrees with the plan. Disposition Plan: Back to previous home environment Consults called: None    Wilmot Quevedo MD [facility]     06/21/2020, 2:32 PM

## 2020-06-21 NOTE — ED Provider Notes (Signed)
[facility] Emergency Department Provider Note  ____________________________________________   First MD Initiated Contact with Patient 06/21/20 [phone]     (approximate)  I have reviewed the triage vital signs and the nursing notes.   HISTORY  Chief Complaint Leg swelling.  HPI Dean Goldner is a 74 y.o. male with hypertension who comes in for scrotal swelling.  Patient reports worsening swelling over his body.  Patient reports the swelling is been worsening in his legs and his scrotum, constant, not better with his Lasix, nothing makes it worse.  Patient was recently increased to 80 mg of Lasix and has been on this for a week but is not had any improvement.  He states that now the swelling is making it more difficult for him to urinate.  He denies any fevers, tenderness in his scrotum.  Denies any abdominal pain.  States that he does use oxygen as needed at home but is not really required needing it recently.  This was after having pneumonia.  Denies any chest pain or shortness of breath.          Past Medical History:  Diagnosis Date  . Hypertension     Patient Active Problem List   Diagnosis Date Noted  . COPD (chronic obstructive pulmonary disease) ([place]) 02/16/2020  . Rupture of left quadriceps tendon 02/15/2020  . Community acquired pneumonia 09/01/2019  . Pneumonia 09/01/2019    Past Surgical History:  Procedure Laterality Date  . INGUINAL HERNIA REPAIR Right 02/04/2017   Procedure: HERNIA REPAIR INGUINAL ADULT;  Surgeon: Leonie Green, MD;  Location: [facility] ORS;  Service: General;  Laterality: Right;  . NO PAST SURGERIES    . QUADRICEPS TENDON REPAIR Left 02/15/2020   Procedure: REPAIR QUADRICEP TENDON;  Surgeon: Corky Mull, MD;  Location: [facility] ORS;  Service: Orthopedics;  Laterality: Left;    Prior to Admission medications   Medication Sig Start Date End Date Taking? Authorizing Provider  aspirin EC 325 MG tablet Take 1 tablet (325 mg  total) by mouth daily. 02/16/20   Lattie Corns, PA-C  cyclobenzaprine (FLEXERIL) 5 MG tablet Take 5 mg by mouth 2 (two) times daily as needed for muscle spasms. 02/14/20   [provider]  oxyCODONE (OXY IR/ROXICODONE) 5 MG immediate release tablet Take 1-2 tablets (5-10 mg total) by mouth every 4 (four) hours as needed for severe pain (pain score 7-10). 02/16/20   Lattie Corns, PA-C  traMADol (ULTRAM) 50 MG tablet Take 50 mg by mouth 3 (three) times daily as needed for pain. 02/14/20   [provider]    Allergies Patient has no known allergies.  Family History  Problem Relation Age of Onset  . Diabetes Mother     Social History Social History   Tobacco Use  . Smoking status: Former Smoker    Quit date: 02/15/2015    Years since quitting: 5.3  . Smokeless tobacco: Never Used  Substance Use Topics  . Alcohol use: Yes    Comment: occassional  . Drug use: No      Review of Systems Constitutional: No fever/chills Eyes: No visual changes. ENT: No sore throat. Cardiovascular: Denies chest pain. Respiratory: Denies shortness of breath. Gastrointestinal: No abdominal pain.  No nausea, no vomiting.  No diarrhea.  No constipation. Genitourinary: Negative for dysuria.  Swelling in his testicles Musculoskeletal: Negative for back pain.  Swelling in his legs Skin: Negative for rash. Neurological: Negative for headaches, focal weakness or numbness. All other ROS  negative ____________________________________________   PHYSICAL EXAM:  VITAL SIGNS: ED Triage Vitals  Enc Vitals Group     BP 06/21/20 0907 124/88     Pulse Rate 06/21/20 0907 96     Resp 06/21/20 0907 16     Temp 06/21/20 0907 98.8 F (37.1 C)     Temp Source 06/21/20 0907 Oral     SpO2 06/21/20 0913 (!) 84 %     Weight 06/21/20 0914 253 lb 8.5 oz (115 kg)     Height 06/21/20 0907 5\' 10"  (1.778 m)     Head Circumference --      Peak Flow --      Pain Score 06/21/20 0907 0     Pain Loc  --      Pain Edu? --      Excl. in [place]? --     Constitutional: Alert and oriented. Well appearing and in no acute distress. Eyes: Conjunctivae are normal. EOMI. Head: Atraumatic. Nose: No congestion/rhinnorhea. Mouth/Throat: Mucous membranes are moist.   Neck: No stridor. Trachea Midline. FROM Cardiovascular: Normal rate, regular rhythm. Grossly normal heart sounds.  Good peripheral circulation. Respiratory: Normal respiratory effort.  No retractions. Lungs CTAB.  Occasionally desats down to 88%.  Placed on 2 L Gastrointestinal: Soft and nontender. No distention. No abdominal bruits.  Musculoskeletal: 2+ edema bilaterally no joint effusions. Neurologic:  Normal speech and language. No gross focal neurologic deficits are appreciated.  Skin:  Skin is warm, dry and intact. No rash noted. Psychiatric: Mood and affect are normal. Speech and behavior are normal. GU: Swelling noted to bilateral testicles and into the penile head.  Still able to appreciate the penile head after retraction.  No changes in the skin color.  ____________________________________________   LABS (all labs ordered are listed, but only abnormal results are displayed)  Labs Reviewed  CBC WITH DIFFERENTIAL/PLATELET - Abnormal; Notable for the following components:      Result Value   HCT 52.8 (*)    MCV 103.3 (*)    Platelets 142 (*)    All other components within normal limits  COMPREHENSIVE METABOLIC PANEL - Abnormal; Notable for the following components:   Chloride 97 (*)    CO2 34 (*)    Glucose, Bld 119 (*)    BUN 34 (*)    Creatinine, Ser 1.95 (*)    Total Protein 5.9 (*)    Albumin 3.4 (*)    Total Bilirubin 1.6 (*)    GFR calc non Af Amer 33 (*)    GFR calc Af Amer 38 (*)    All other components within normal limits  BRAIN NATRIURETIC PEPTIDE - Abnormal; Notable for the following components:   B Natriuretic Peptide 674.8 (*)    All other components within normal limits  URINALYSIS, COMPLETE (UACMP)  WITH MICROSCOPIC - Abnormal; Notable for the following components:   Color, Urine STRAW (*)    APPearance CLEAR (*)    All other components within normal limits  TROPONIN I (HIGH SENSITIVITY) - Abnormal; Notable for the following components:   Troponin I (High Sensitivity) 199 (*)    All other components within normal limits  TROPONIN I (HIGH SENSITIVITY) - Abnormal; Notable for the following components:   Troponin I (High Sensitivity) 192 (*)    All other components within normal limits  SARS CORONAVIRUS 2 BY RT PCR (HOSPITAL ORDER, [facility])  MAGNESIUM   ____________________________________________   ED ECG REPORT I, Vanessa [place], the  attending physician, personally viewed and interpreted this ECG.  EKG is sinus rate of 85, no ST elevation, T wave inversions in V2 and V3, normal intervals with occasional PVC  ____________________________________________  RADIOLOGY Robert Bellow, personally viewed and evaluated these images (plain radiographs) as part of my medical decision making, as well as reviewing the written report by the radiologist.  ED MD interpretation: Concern for pulmonary edema  Official radiology report(s): DG Chest Portable 1 View  Result Date: 06/21/2020 CLINICAL DATA:  Body swelling for awhile, scrotal swelling for 2 days, hypertension EXAM: PORTABLE CHEST 1 VIEW COMPARISON:  Portable exam 0944 hours compared to 02/16/2020 FINDINGS: Enlargement of cardiac silhouette with pulmonary vascular congestion. Mediastinal contours normal with mild atherosclerotic calcification aorta. RIGHT basilar atelectasis. Remaining lungs clear. No pleural effusion or pneumothorax. IMPRESSION: Enlargement of cardiac silhouette with pulmonary vascular congestion. RIGHT basilar atelectasis. Electronically Signed   By: Lavonia Dana M.D.   On: 06/21/2020 10:00   US SCROTUM W/DOPPLER  Result Date: 06/21/2020 CLINICAL DATA:  Scrotal swelling for 2 months. EXAM:  SCROTAL ULTRASOUND DOPPLER ULTRASOUND OF THE TESTICLES TECHNIQUE: Complete ultrasound examination of the testicles, epididymis, and other scrotal structures was performed. Color and spectral Doppler ultrasound were also utilized to evaluate blood flow to the testicles. COMPARISON:  None. FINDINGS: Right testicle Measurements: 3.4 x 2.8 x 2.5 cm. No mass or microlithiasis visualized. Left testicle Measurements: 3.3 x 2.7 x 2.6 cm. No mass or microlithiasis visualized. Right epididymis:  Normal in size and appearance. Left epididymis:  Normal in size and appearance. Hydrocele:  Small bilateral hydroceles are noted. Varicocele:  Small left varicocele may be present. Pulsed Doppler interrogation of both testes demonstrates normal low resistance arterial and venous waveforms bilaterally. Bilateral scrotal wall swelling is noted. IMPRESSION: Bilateral scrotal wall swelling is noted concerning for inflammation or edema. No definite evidence of testicular mass or torsion is noted. Small bilateral hydroceles are noted. Electronically Signed   By: Marijo Conception M.D.   On: 06/21/2020 10:44    ____________________________________________   PROCEDURES  Procedure(s) performed (including Critical Care):  .Critical Care Performed by: Vanessa Lupton, MD Authorized by: Vanessa Capron, MD   Critical care provider statement:    Critical care time (minutes):  45   Critical care was necessary to treat or prevent imminent or life-threatening deterioration of the following conditions:  Respiratory failure   Critical care was time spent personally by me on the following activities:  Discussions with consultants, evaluation of patient's response to treatment, examination of patient, ordering and performing treatments and interventions, ordering and review of laboratory studies, ordering and review of radiographic studies, pulse oximetry, re-evaluation of patient's condition, obtaining history from patient or surrogate and  review of old charts     ____________________________________________   INITIAL IMPRESSION / ASSESSMENT AND PLAN / ED COURSE  Mendy Lapinsky was evaluated in Emergency Department on 06/21/2020 for the symptoms described in the history of present illness. He was evaluated in the context of the global COVID-19 pandemic, which necessitated consideration that the patient might be at risk for infection with the SARS-CoV-2 virus that causes COVID-19. Institutional protocols and algorithms that pertain to the evaluation of patients at risk for COVID-19 are in a state of rapid change based on information released by regulatory bodies including the CDC and federal and state organizations. These policies and algorithms were followed during the patient's care in the ED.     Patient is a well-appearing 74 year old who comes  in with concerns for scrotal and leg swelling.  Patient does not feel short of breath.  Initially patient sats were 70% in triage but when the probe was moved to his ear his saturations were in the 90s.  He still occasionally desatted down to 88% so I placed him on 2 L of oxygen.  I suspect that this is most likely secondary to fluid overload.  Will get chest x-ray to evaluate for fluid, pneumonia, Covid swab.  Low suspicion for ACS but will get cardiac markers and EKG.  No signs of obvious infection on his scrotum.  More likely consistent with fluid overload.  Will get ultrasound to make sure no evidence of any infections, mass.  Patient had recent DVT ultrasounds that were negative.  Patient's labs are consistent with fluid overload.  His BNP is 6 times his baseline.  Chest x-ray concerning for pulmonary vascular congestion.  Kidney function is up from 1.3-1.95.  Given patient is on 2 L of oxygen and patient will need to be admitted for acute hypoxic respiratory failure secondary to CHF to get IV diuretics.  Patient was given 80 of IV Lasix.  Ultrasound was mostly consistent with edema  consider inflammation or infection.  At this time of low suspicion for infection given he is afebrile, no white count and there were no skin changes noted over the scrotum on my evaluation.  Troponin was slightly elevated but downtrending.  Suspect that this is demand secondary to CHF.  He denies any chest pain.          ____________________________________________   FINAL CLINICAL IMPRESSION(S) / ED DIAGNOSES   Final diagnoses:  Acute on chronic congestive heart failure, unspecified heart failure type (HCC)  Swelling  AKI (acute kidney injury) ([place])  Acute respiratory failure with hypoxia (HCC)      MEDICATIONS GIVEN DURING THIS VISIT:  Medications  furosemide (LASIX) injection 80 mg (80 mg Intravenous Given 06/21/20 1119)     ED Discharge Orders    None       Note:  This document was prepared using Dragon voice recognition software and may include unintentional dictation errors.   Vanessa [place], MD 06/21/20 1321

## 2020-06-21 NOTE — ED Notes (Signed)
Pt given snack and drink. Pt asleep upon this RN's entrance to room.

## 2020-06-22 ENCOUNTER — Inpatient Hospital Stay
Admit: 2020-06-22 | Discharge: 2020-06-22 | Disposition: A | Discharge status: Home / Self Care | Payer: Medicare HMO | Attending: Internal Medicine | Admitting: Internal Medicine

## 2020-06-22 ENCOUNTER — Encounter: Payer: Self-pay | Admitting: Internal Medicine

## 2020-06-22 ENCOUNTER — Other Ambulatory Visit: Payer: Self-pay

## 2020-06-22 DIAGNOSIS — I272 Pulmonary hypertension, unspecified: Secondary | ICD-10-CM | POA: Diagnosis present

## 2020-06-22 DIAGNOSIS — Z20822 Contact with and (suspected) exposure to covid-19: Secondary | ICD-10-CM | POA: Diagnosis present

## 2020-06-22 DIAGNOSIS — I5043 Acute on chronic combined systolic (congestive) and diastolic (congestive) heart failure: Secondary | ICD-10-CM | POA: Diagnosis present

## 2020-06-22 DIAGNOSIS — K8020 Calculus of gallbladder without cholecystitis without obstruction: Secondary | ICD-10-CM | POA: Diagnosis present

## 2020-06-22 DIAGNOSIS — N179 Acute kidney failure, unspecified: Secondary | ICD-10-CM

## 2020-06-22 DIAGNOSIS — J9621 Acute and chronic respiratory failure with hypoxia: Secondary | ICD-10-CM | POA: Diagnosis present

## 2020-06-22 DIAGNOSIS — D7589 Other specified diseases of blood and blood-forming organs: Secondary | ICD-10-CM | POA: Diagnosis present

## 2020-06-22 DIAGNOSIS — I5041 Acute combined systolic (congestive) and diastolic (congestive) heart failure: Secondary | ICD-10-CM | POA: Diagnosis not present

## 2020-06-22 DIAGNOSIS — Z9114 Patient's other noncompliance with medication regimen: Secondary | ICD-10-CM | POA: Diagnosis not present

## 2020-06-22 DIAGNOSIS — Z87891 Personal history of nicotine dependence: Secondary | ICD-10-CM | POA: Diagnosis not present

## 2020-06-22 DIAGNOSIS — N1832 Chronic kidney disease, stage 3b: Secondary | ICD-10-CM | POA: Diagnosis present

## 2020-06-22 DIAGNOSIS — R601 Generalized edema: Secondary | ICD-10-CM | POA: Diagnosis not present

## 2020-06-22 DIAGNOSIS — I10 Essential (primary) hypertension: Secondary | ICD-10-CM | POA: Diagnosis not present

## 2020-06-22 DIAGNOSIS — I130 Hypertensive heart and chronic kidney disease with heart failure and stage 1 through stage 4 chronic kidney disease, or unspecified chronic kidney disease: Secondary | ICD-10-CM | POA: Diagnosis present

## 2020-06-22 DIAGNOSIS — J449 Chronic obstructive pulmonary disease, unspecified: Secondary | ICD-10-CM | POA: Diagnosis present

## 2020-06-22 DIAGNOSIS — N5089 Other specified disorders of the male genital organs: Secondary | ICD-10-CM

## 2020-06-22 DIAGNOSIS — Z79899 Other long term (current) drug therapy: Secondary | ICD-10-CM | POA: Diagnosis not present

## 2020-06-22 DIAGNOSIS — E873 Alkalosis: Secondary | ICD-10-CM | POA: Diagnosis present

## 2020-06-22 DIAGNOSIS — R609 Edema, unspecified: Secondary | ICD-10-CM | POA: Diagnosis present

## 2020-06-22 DIAGNOSIS — I2510 Atherosclerotic heart disease of native coronary artery without angina pectoris: Secondary | ICD-10-CM | POA: Diagnosis present

## 2020-06-22 DIAGNOSIS — I714 Abdominal aortic aneurysm, without rupture: Secondary | ICD-10-CM | POA: Diagnosis present

## 2020-06-22 DIAGNOSIS — E785 Hyperlipidemia, unspecified: Secondary | ICD-10-CM | POA: Diagnosis present

## 2020-06-22 DIAGNOSIS — Z7982 Long term (current) use of aspirin: Secondary | ICD-10-CM | POA: Diagnosis not present

## 2020-06-22 DIAGNOSIS — E876 Hypokalemia: Secondary | ICD-10-CM | POA: Diagnosis not present

## 2020-06-22 DIAGNOSIS — T502X5A Adverse effect of carbonic-anhydrase inhibitors, benzothiadiazides and other diuretics, initial encounter: Secondary | ICD-10-CM | POA: Diagnosis present

## 2020-06-22 LAB — URINALYSIS, COMPLETE (UACMP) WITH MICROSCOPIC
Bilirubin Urine: NEGATIVE
Glucose, UA: NEGATIVE mg/dL
Hgb urine dipstick: NEGATIVE
Ketones, ur: NEGATIVE mg/dL
Leukocytes,Ua: NEGATIVE
Nitrite: NEGATIVE
Protein, ur: NEGATIVE mg/dL
Specific Gravity, Urine: 1.013 (ref 1.005–1.030)
pH: 5 (ref 5.0–8.0)

## 2020-06-22 LAB — BASIC METABOLIC PANEL
Anion gap: 13 (ref 5–15)
BUN: 29 mg/dL — ABNORMAL HIGH (ref 8–23)
CO2: 33 mmol/L — ABNORMAL HIGH (ref 22–32)
Calcium: 8.9 mg/dL (ref 8.9–10.3)
Chloride: 97 mmol/L — ABNORMAL LOW (ref 98–111)
Creatinine, Ser: 1.69 mg/dL — ABNORMAL HIGH (ref 0.61–1.24)
GFR calc Af Amer: 45 mL/min — ABNORMAL LOW (ref >60)
GFR calc non Af Amer: 39 mL/min — ABNORMAL LOW (ref >60)
Glucose, Bld: 114 mg/dL — ABNORMAL HIGH (ref 70–99)
Potassium: 4.5 mmol/L (ref 3.5–5.1)
Sodium: 143 mmol/L (ref 135–145)

## 2020-06-22 LAB — ECHOCARDIOGRAM COMPLETE
Height: 70 in
Weight: 3923.2 oz

## 2020-06-22 LAB — TROPONIN I (HIGH SENSITIVITY): Troponin I (High Sensitivity): 157 ng/L (ref <18)

## 2020-06-22 NOTE — Progress Notes (Signed)
[facility] PROGRESS NOTE   John [name] WUJ:[id number] DOB: 1946/09/10 DOA: 06/21/2020  PCP: Tracie Harrier, MD  Brief History/Interval Summary: 74 y.o. male with medical history significant for hypertension and coronary artery disease who presented to the emergency room for evaluation of generalized body swelling but mostly in the scrotal area and lower extremity.  Patient states that he has been compliant with his Lasix 80 mg daily without any improvement in the swelling.  He complains of difficulty voiding due to the scrotal swelling. He denies any dietary indiscretion.  Patient was admitted for further management of anasarca.  Reason for Visit: Anasarca  Consultants: None  Procedures: Transthoracic echocardiogram is pending  Antibiotics: Anti-infectives (From admission, onward)   None      Subjective/Interval History: Patient mentions that his swelling remains the same although he is wondering when he can be discharged.  He denies any chest pain.  Does not have any difficulty breathing.  He was mainly concerned about the swelling in his scrotal area apart from the swelling in his legs.  Denies any nausea or vomiting.   Assessment/Plan:  Anasarca Thought to be due to congestive heart failure.  No previous echocardiogram data available.  Urea.  His albumin level is 3.4.  Does not have any known history of liver disease.  Echocardiogram has been ordered and is pending.  Lower extremity Doppler study on June 30 did not show any DVT.  Seen by his primary care provider on June 29 for similar issue.  At that time he was noted to be on amlodipine which was discontinued.  Started on 80 mg of Lasix daily.  There is mention of proteinuria and apparently referral was made to nephrology.  On June 22 UA did show mild proteinuria but UA done here in this hospital does not show any proteinuria.  We will repeat.  Acute on chronic kidney disease stage III Creatinine noted to be 1.95  at admission.  It was around 1.3 a few months ago.  Improved to 1.6 today.  Continue with diuretics keeping a close watch on his renal function.  Per PCP notes from June he was supposed to be referred to nephrology.  Scrotal swelling Likely secondary to anasarca.  Scrotal ultrasound reviewed.  Bilateral hydrocele noted.  Scrotal wall thickening was noted.  No other acute abnormalities.    Essential hypertension Amlodipine recently discontinued by PCP.  Monitor blood pressures closely.  Looks like his ARB and HCTZ were also recently discontinued.  Currently on metoprolol.  Acute on chronic respiratory failure with hypoxia/history of COPD Noted to be on oxygen at 2 L/min.  Apparently uses oxygen at home.  Chest x-ray showed pulmonary vascular congestion and some atelectasis.  Unclear if he uses any inhalers at home for his COPD  History of ascending aortic aneurysm Followed in the outpatient setting for same.  Macrocytosis Hemoglobin is normal.  Check TSH B12 folate levels.  DVT Prophylaxis: Lovenox Code Status: Full code Family Communication: Discussed with the patient Disposition Plan:  Status is: Inpatient  Remains inpatient appropriate because:IV treatments appropriate due to intensity of illness or inability to take PO   Dispo: The patient is from: Home              Anticipated d/c is to: Home              Anticipated d/c date is: 2 days              Patient currently is not medically  stable to d/c.      Medications:  Scheduled: . aspirin EC  325 mg Oral Daily  . enoxaparin (LOVENOX) injection  40 mg Subcutaneous Q24H  . furosemide  80 mg Intravenous BID  . metoprolol succinate  25 mg Oral Daily  . sodium chloride flush  3 mL Intravenous Q12H   Continuous: . sodium chloride     GYI:RSWNIO chloride, acetaminophen, ondansetron (ZOFRAN) IV, sodium chloride flush   Objective:  Vital Signs  Vitals:   06/22/20 0252 06/22/20 0427 06/22/20 0747 06/22/20 1142  BP: (!)  156/103 (!) 152/85 (!) 140/104 129/85  Pulse: 87 86 82 83  Resp: (!) 24 20 19 18   Temp: 98.3 F (36.8 C) 98.4 F (36.9 C) 98.1 F (36.7 C) 98.3 F (36.8 C)  TempSrc: Oral  Oral Oral  SpO2: 100% 90% 94% 96%  Weight:      Height:        Intake/Output Summary (Last 24 hours) at 06/22/2020 1310 Last data filed at 06/22/2020 1000 Gross per 24 hour  Intake 720 ml  Output 2550 ml  Net -1830 ml   Filed Weights   06/21/20 0914 06/22/20 0246  Weight: 115 kg 111.2 kg    General appearance: Awake alert.  In no distress Resp: Normal effort at rest.  No definite wheezing or crackles appreciated.  Cardio: S1-S2 is normal regular.  No S3-S4.  No rubs murmurs or bruit GI: Abdomen is soft.  Nontender nondistended.  Bowel sounds are present normal.  No masses organomegaly Extremities: 2-3+ pitting edema bilateral lower extremities extending all the way up to the scrotal area. Neurologic: Alert and oriented x3.  No focal neurological deficits.    Lab Results:  Data Reviewed: I have personally reviewed following labs and imaging studies  CBC: Recent Labs  Lab 06/21/20 0925  WBC 9.0  NEUTROABS 6.8  HGB 16.5  HCT 52.8*  MCV 103.3*  PLT 142*    Basic Metabolic Panel: Recent Labs  Lab 06/21/20 0925 06/22/20 0417  NA 142 143  K 4.2 4.5  CL 97* 97*  CO2 34* 33*  GLUCOSE 119* 114*  BUN 34* 29*  CREATININE 1.95* 1.69*  CALCIUM 9.0 8.9  MG 2.1  --     GFR: Estimated Creatinine Clearance: 47.9 mL/min (A) (by C-G formula based on SCr of 1.69 mg/dL (H)).  Liver Function Tests: Recent Labs  Lab 06/21/20 0925  AST 30  ALT 39  ALKPHOS 61  BILITOT 1.6*  PROT 5.9*  ALBUMIN 3.4*     Recent Results (from the past 240 hour(s))  SARS Coronavirus 2 by RT PCR (hospital order, performed in [facility] hospital lab) Nasopharyngeal Nasopharyngeal Swab     Status: None   Collection Time: 06/21/20 10:24 AM   Specimen: Nasopharyngeal Swab  Result Value Ref Range Status   SARS  Coronavirus 2 NEGATIVE NEGATIVE Final    Comment: (NOTE) SARS-CoV-2 target nucleic acids are NOT DETECTED.  The SARS-CoV-2 RNA is generally detectable in upper and lower respiratory specimens during the acute phase of infection. The lowest concentration of SARS-CoV-2 viral copies this assay can detect is 250 copies / mL. A negative result does not preclude SARS-CoV-2 infection and should not be used as the sole basis for treatment or other patient management decisions.  A negative result may occur with improper specimen collection / handling, submission of specimen other than nasopharyngeal swab, presence of viral mutation(s) within the areas targeted by this assay, and inadequate number of viral copies (<  250 copies / mL). A negative result must be combined with clinical observations, patient history, and epidemiological information.  Fact Sheet for Patients:   StrictlyIdeas.no  Fact Sheet for Healthcare Providers: BankingDealers.co.za  This test is not yet approved or  cleared by the [place] FDA and has been authorized for detection and/or diagnosis of SARS-CoV-2 by FDA under an Emergency Use Authorization (EUA).  This EUA will remain in effect (meaning this test can be used) for the duration of the COVID-19 declaration under Section 564(b)(1) of the Act, 21 U.S.C. section 360bbb-3(b)(1), unless the authorization is terminated or revoked sooner.  Performed at [facility], [address] [place], [place] [address]       Radiology Studies: DG Chest Portable 1 View  Result Date: 06/21/2020 CLINICAL DATA:  Body swelling for awhile, scrotal swelling for 2 days, hypertension EXAM: PORTABLE CHEST 1 VIEW COMPARISON:  Portable exam 0944 hours compared to 02/16/2020 FINDINGS: Enlargement of cardiac silhouette with pulmonary vascular congestion. Mediastinal contours normal with mild atherosclerotic calcification aorta. RIGHT  basilar atelectasis. Remaining lungs clear. No pleural effusion or pneumothorax. IMPRESSION: Enlargement of cardiac silhouette with pulmonary vascular congestion. RIGHT basilar atelectasis. Electronically Signed   By: Lavonia Dana M.D.   On: 06/21/2020 10:00   US SCROTUM W/DOPPLER  Result Date: 06/21/2020 CLINICAL DATA:  Scrotal swelling for 2 months. EXAM: SCROTAL ULTRASOUND DOPPLER ULTRASOUND OF THE TESTICLES TECHNIQUE: Complete ultrasound examination of the testicles, epididymis, and other scrotal structures was performed. Color and spectral Doppler ultrasound were also utilized to evaluate blood flow to the testicles. COMPARISON:  None. FINDINGS: Right testicle Measurements: 3.4 x 2.8 x 2.5 cm. No mass or microlithiasis visualized. Left testicle Measurements: 3.3 x 2.7 x 2.6 cm. No mass or microlithiasis visualized. Right epididymis:  Normal in size and appearance. Left epididymis:  Normal in size and appearance. Hydrocele:  Small bilateral hydroceles are noted. Varicocele:  Small left varicocele may be present. Pulsed Doppler interrogation of both testes demonstrates normal low resistance arterial and venous waveforms bilaterally. Bilateral scrotal wall swelling is noted. IMPRESSION: Bilateral scrotal wall swelling is noted concerning for inflammation or edema. No definite evidence of testicular mass or torsion is noted. Small bilateral hydroceles are noted. Electronically Signed   By: Marijo Conception M.D.   On: 06/21/2020 10:44       LOS: 0 days   [facility] Pager on [email or web address]  06/22/2020, 1:10 PM

## 2020-06-22 NOTE — Progress Notes (Signed)
*  PRELIMINARY RESULTS* Echocardiogram 2D Echocardiogram has been performed.  John [name] 06/22/2020, 11:41 AM

## 2020-06-23 ENCOUNTER — Inpatient Hospital Stay: Payer: Medicare HMO

## 2020-06-23 DIAGNOSIS — I10 Essential (primary) hypertension: Secondary | ICD-10-CM | POA: Diagnosis not present

## 2020-06-23 DIAGNOSIS — R601 Generalized edema: Secondary | ICD-10-CM | POA: Diagnosis not present

## 2020-06-23 DIAGNOSIS — N5089 Other specified disorders of the male genital organs: Secondary | ICD-10-CM | POA: Diagnosis not present

## 2020-06-23 DIAGNOSIS — I5021 Acute systolic (congestive) heart failure: Secondary | ICD-10-CM

## 2020-06-23 DIAGNOSIS — N179 Acute kidney failure, unspecified: Secondary | ICD-10-CM | POA: Diagnosis not present

## 2020-06-23 LAB — BASIC METABOLIC PANEL
Anion gap: 13 (ref 5–15)
BUN: 23 mg/dL (ref 8–23)
CO2: 37 mmol/L — ABNORMAL HIGH (ref 22–32)
Calcium: 9 mg/dL (ref 8.9–10.3)
Chloride: 94 mmol/L — ABNORMAL LOW (ref 98–111)
Creatinine, Ser: 1.49 mg/dL — ABNORMAL HIGH (ref 0.61–1.24)
GFR calc Af Amer: 53 mL/min — ABNORMAL LOW (ref >60)
GFR calc non Af Amer: 46 mL/min — ABNORMAL LOW (ref >60)
Glucose, Bld: 105 mg/dL — ABNORMAL HIGH (ref 70–99)
Potassium: 4.4 mmol/L (ref 3.5–5.1)
Sodium: 144 mmol/L (ref 135–145)

## 2020-06-23 LAB — VITAMIN B12: Vitamin B-12: 461 pg/mL (ref 180–914)

## 2020-06-23 LAB — CBC
HCT: 56 % — ABNORMAL HIGH (ref 39.0–52.0)
Hemoglobin: 16.9 g/dL (ref 13.0–17.0)
MCH: 32 pg (ref 26.0–34.0)
MCHC: 30.2 g/dL (ref 30.0–36.0)
MCV: 106.1 fL — ABNORMAL HIGH (ref 80.0–100.0)
Platelets: 132 10*3/uL — ABNORMAL LOW (ref 150–400)
RBC: 5.28 MIL/uL (ref 4.22–5.81)
RDW: 14.4 % (ref 11.5–15.5)
WBC: 8.1 10*3/uL (ref 4.0–10.5)
nRBC: 0 % (ref 0.0–0.2)

## 2020-06-23 LAB — FOLATE: Folate: 15.2 ng/mL (ref >5.9)

## 2020-06-23 LAB — TSH: TSH: 1.294 u[IU]/mL (ref 0.350–4.500)

## 2020-06-23 MED ORDER — FUROSEMIDE 10 MG/ML IJ SOLN
80.0000 mg | Freq: Two times a day (BID) | INTRAMUSCULAR | Status: DC
  Administered 2020-06-23 – 2020-06-26 (×6): 80 mg via INTRAVENOUS
  Filled 2020-06-23 (×6): qty 8

## 2020-06-23 MED ORDER — FUROSEMIDE 10 MG/ML IJ SOLN
80.0000 mg | Freq: Once | INTRAMUSCULAR | Status: AC
  Administered 2020-06-23: 80 mg via INTRAVENOUS
  Filled 2020-06-23: qty 8

## 2020-06-23 NOTE — Plan of Care (Signed)

## 2020-06-23 NOTE — Clinical Social Work Note (Signed)
Pt has a scale at home. Pt is now aware to weigh himself daily and if there is noted weight gain (2 lbs over night or 5 in one week) to notify MD. Pt also aware a follow up apt will be scheduled for him at [facility]. Pt denies any additional needs at this time.  [place], [place]

## 2020-06-23 NOTE — Consult Note (Signed)
[facility] Consultation Note  Patient ID: John [name], MRN: [id number], DOB/AGE: 03-01-1946 74 y.o. Admit date: 06/21/2020   Date of Consult: 06/23/2020 Primary Physician: Tracie Harrier, MD Primary Cardiologist: Nehemiah Massed  Chief Complaint: No chief complaint on file.  Reason for Consult: Heart failure  HPI: 74 y.o. male with known chronic kidney disease stage III with a GFR of 45 coronary atherosclerosis hypertension hyperlipidemia and combined congestive heart failure who complains that the patient has significant lower extremity edema scrotal edema shortness of breath weakness fatigue and weight gain.  This is, over a several week.  But culminated in unable to do any activities whatsoever.  The patient was seen in the emergency room with an EKG showing normal sinus rhythm with left atrial enlargement and poor R wave progression.  Echocardiogram in the past has shown mild global LV systolic dysfunction ejection fraction 45% with some pulmonary hypertension and mild valvular heart disease.  The patient has a chest x-ray showing vascular congestion with a troponin of 157 consistent with demand ischemia without evidence of myocardial infarction and/or acute coronary syndrome.  Currently the patient has felt slightly better with intravenous Lasix.  Past Medical History:  Diagnosis Date  . Hypertension       Surgical History:  Past Surgical History:  Procedure Laterality Date  . INGUINAL HERNIA REPAIR Right 02/04/2017   Procedure: HERNIA REPAIR INGUINAL ADULT;  Surgeon: Leonie Green, MD;  Location: [facility] ORS;  Service: General;  Laterality: Right;  . NO PAST SURGERIES    . QUADRICEPS TENDON REPAIR Left 02/15/2020   Procedure: REPAIR QUADRICEP TENDON;  Surgeon: Corky Mull, MD;  Location: [facility] ORS;  Service: Orthopedics;  Laterality: Left;     Home Meds: Prior to Admission medications   Medication Sig Start Date End Date Taking? Authorizing Provider  aspirin 81 MG  chewable tablet Chew 81 mg by mouth daily.   Yes [provider]  furosemide (LASIX) 80 MG tablet Take 80 mg by mouth daily. 06/13/20  Yes [provider]  losartan (COZAAR) 100 MG tablet Take 100 mg by mouth daily. 06/13/20  Yes [provider]  aspirin EC 325 MG tablet Take 1 tablet (325 mg total) by mouth daily. Patient not taking: Reported on 06/21/2020 02/16/20   Lattie Corns, PA-C  oxyCODONE (OXY IR/ROXICODONE) 5 MG immediate release tablet Take 1-2 tablets (5-10 mg total) by mouth every 4 (four) hours as needed for severe pain (pain score 7-10). Patient not taking: Reported on 06/21/2020 02/16/20   Lattie Corns, PA-C    Inpatient Medications:  . aspirin EC  325 mg Oral Daily  . enoxaparin (LOVENOX) injection  40 mg Subcutaneous Q24H  . furosemide  80 mg Intravenous Q12H  . metoprolol succinate  25 mg Oral Daily  . sodium chloride flush  3 mL Intravenous Q12H   . sodium chloride      Allergies: No Known Allergies  Social History   Socioeconomic History  . Marital status: Single    Spouse name: Not on file  . Number of children: Not on file  . Years of education: Not on file  . Highest education level: Not on file  Occupational History  . Not on file  Tobacco Use  . Smoking status: Former Smoker    Quit date: 02/15/2015    Years since quitting: 5.3  . Smokeless tobacco: Never Used  Substance and Sexual Activity  . Alcohol use: Yes    Comment: occassional  . Drug  use: No  . Sexual activity: Not on file  Other Topics Concern  . Not on file  Social History Narrative  . Not on file   Social Determinants of Health   Financial Resource Strain:   . Difficulty of Paying Living Expenses:   Food Insecurity:   . Worried About Charity fundraiser in the Last Year:   . Arboriculturist in the Last Year:   Transportation Needs: No Transportation Needs  . Lack of Transportation (Medical): No  . Lack of Transportation (Non-Medical): No   Physical Activity:   . Days of Exercise per Week:   . Minutes of Exercise per Session:   Stress:   . Feeling of Stress :   Social Connections:   . Frequency of Communication with Friends and Family:   . Frequency of Social Gatherings with Friends and Family:   . Attends Religious Services:   . Active Member of Clubs or Organizations:   . Attends Archivist Meetings:   Marland Kitchen Marital Status:   Intimate Partner Violence:   . Fear of Current or Ex-Partner:   . Emotionally Abused:   Marland Kitchen Physically Abused:   . Sexually Abused:      Family History  Problem Relation Age of Onset  . Diabetes Mother      Review of Systems Positive for shortness of breath and weight gain extremity edema Negative for: General:  chills, fever, night sweats positive for weight changes.  Cardiovascular: PND orthopnea syncope dizziness  Dermatological skin lesions rashes Respiratory: Cough congestion Urologic: Frequent urination urination at night and hematuria Abdominal: negative for nausea, vomiting, diarrhea, bright red blood per rectum, melena, or hematemesis Neurologic: negative for visual changes, and/or hearing changes  All other systems reviewed and are otherwise negative except as noted above.  Labs: No results for input(s): CKTOTAL, CKMB, TROPONINI in the last 72 hours. Lab Results  Component Value Date   WBC 8.1 06/23/2020   HGB 16.9 06/23/2020   HCT 56.0 (H) 06/23/2020   MCV 106.1 (H) 06/23/2020   PLT 132 (L) 06/23/2020    Recent Labs  Lab 06/21/20 0925 06/22/20 0417 06/23/20 0734  NA 142   < > 144  K 4.2   < > 4.4  CL 97*   < > 94*  CO2 34*   < > 37*  BUN 34*   < > 23  CREATININE 1.95*   < > 1.49*  CALCIUM 9.0   < > 9.0  PROT 5.9*  --   --   BILITOT 1.6*  --   --   ALKPHOS 61  --   --   ALT 39  --   --   AST 30  --   --   GLUCOSE 119*   < > 105*   < > = values in this interval not displayed.   No results found for: CHOL, HDL, LDLCALC, TRIG No results found for:  DDIMER  Radiology/Studies:  US Abdomen Complete  Result Date: 06/23/2020 CLINICAL DATA:  Hyperbilirubinemia, acute renal failure EXAM: ABDOMEN ULTRASOUND COMPLETE COMPARISON:  None. FINDINGS: Gallbladder: There is layering sludge within the gallbladder, however, the gallbladder is not distended, there is no gallbladder wall thickening, and no pericholecystic fluid is identified. The sonographic Percell Miller sign is reportedly negative. Common bile duct: Diameter: 5 mm in proximal diameter. The distal duct is obscured by overlying bowel gas. Liver: No focal lesion identified. Within normal limits in parenchymal echogenicity. Portal vein is patent on color  Doppler imaging with normal direction of blood flow towards the liver. IVC: No abnormality visualized. Pancreas: Visualized portion unremarkable. Spleen: Size and appearance within normal limits. Right Kidney: Length: 9.9 cm (see image 28). Renal cortical thickness is preserved. Cortical echogenicity, however, is diffusely increased in keeping with changes of underlying medical renal disease. There is no hydronephrosis. No intrarenal mass or calcification is identified. Left Kidney: Length: 10.8 cm. Renal cortical thickness is preserved. Cortical echogenicity, however, is diffusely increased in keeping with changes of underlying medical renal disease. There is no hydronephrosis. No intrarenal mass or calcification is identified. Abdominal aorta: A 3.2 cm aneurysm is seen of the mid abdominal aorta. The distal aorta is obscured by overlying bowel gas. Other findings: No ascites IMPRESSION: Cholelithiasis without sonographic evidence of acute cholecystitis. No intra or extrahepatic biliary ductal dilation. Diffusely increased renal cortical echogenicity suggesting changes of underlying medical renal disease. Incomplete evaluation of the abdominal aorta demonstrating a minimum 3.2 cm juxtarenal/infrarenal abdominal aortic aneurysm. This would be better assessed with  dedicated CT imaging. Electronically Signed   By: Fidela Salisbury MD   On: 06/23/2020 08:38   US Venous Img Lower Bilateral (DVT)  Result Date: 06/14/2020 CLINICAL DATA:  Bilateral lower extremity swelling EXAM: BILATERAL LOWER EXTREMITY VENOUS DOPPLER ULTRASOUND TECHNIQUE: Gray-scale sonography with graded compression, as well as color Doppler and duplex ultrasound were performed to evaluate the lower extremity deep venous systems from the level of the common femoral vein and including the common femoral, femoral, profunda femoral, popliteal and calf veins including the posterior tibial, peroneal and gastrocnemius veins when visible. The superficial great saphenous vein was also interrogated. Spectral Doppler was utilized to evaluate flow at rest and with distal augmentation maneuvers in the common femoral, femoral and popliteal veins. COMPARISON:  None. FINDINGS: RIGHT LOWER EXTREMITY Common Femoral Vein: No evidence of thrombus. Normal compressibility, respiratory phasicity and response to augmentation. Saphenofemoral Junction: No evidence of thrombus. Normal compressibility and flow on color Doppler imaging. Profunda Femoral Vein: No evidence of thrombus. Normal compressibility and flow on color Doppler imaging. Femoral Vein: No evidence of thrombus. Normal compressibility, respiratory phasicity and response to augmentation. Popliteal Vein: No evidence of thrombus. Normal compressibility, respiratory phasicity and response to augmentation. Calf Veins: Right calf veins are poorly visualized secondary to edema. No gross thrombus evident. LEFT LOWER EXTREMITY Common Femoral Vein: No evidence of thrombus. Normal compressibility, respiratory phasicity and response to augmentation. Saphenofemoral Junction: No evidence of thrombus. Normal compressibility and flow on color Doppler imaging. Profunda Femoral Vein: No evidence of thrombus. Normal compressibility and flow on color Doppler imaging. Femoral Vein: No  evidence of thrombus. Normal compressibility, respiratory phasicity and response to augmentation. Popliteal Vein: No evidence of thrombus. Normal compressibility, respiratory phasicity and response to augmentation. Calf Veins: No evidence of thrombus. Normal compressibility and flow on color Doppler imaging. IMPRESSION: No significant DVT in either extremity. Limited assessment of the right calf veins. Electronically Signed   By: Jerilynn Mages.  Shick M.D.   On: 06/14/2020 12:52   DG Chest Portable 1 View  Result Date: 06/21/2020 CLINICAL DATA:  Body swelling for awhile, scrotal swelling for 2 days, hypertension EXAM: PORTABLE CHEST 1 VIEW COMPARISON:  Portable exam 0944 hours compared to 02/16/2020 FINDINGS: Enlargement of cardiac silhouette with pulmonary vascular congestion. Mediastinal contours normal with mild atherosclerotic calcification aorta. RIGHT basilar atelectasis. Remaining lungs clear. No pleural effusion or pneumothorax. IMPRESSION: Enlargement of cardiac silhouette with pulmonary vascular congestion. RIGHT basilar atelectasis. Electronically Signed   By: Elta Guadeloupe  Thornton Papas M.D.   On: 06/21/2020 10:00   ECHOCARDIOGRAM COMPLETE  Result Date: 06/22/2020    ECHOCARDIOGRAM REPORT   Patient Name:   John [name] Date of Exam: 06/22/2020 Medical Rec #:  [id number]      Height:       70.0 in Accession #:    [id number]     Weight:       245.2 lb Date of Birth:  10-25-1946     BSA:          2.276 m Patient Age:    43 years       BP:           140/104 mmHg Patient Gender: M              HR:           82 bpm. Exam Location:  [facility] Procedure: 2D Echo, Cardiac Doppler and Color Doppler Indications:     CHF- acute diastolic 956.38  History:         Patient has no prior history of Echocardiogram examinations.                  Risk Factors:Hypertension.  Sonographer:     Sherrie Sport RDCS (AE) Referring Phys:  [place] Diagnosing Phys: Serafina Royals MD IMPRESSIONS  1. Left ventricular ejection fraction, by estimation,  is 45 to 50%. The left ventricle has mildly decreased function. The left ventricle has no regional wall motion abnormalities. Left ventricular diastolic parameters were normal.  2. Right ventricular systolic function is normal. The right ventricular size is normal. There is normal pulmonary artery systolic pressure.  3. The mitral valve is normal in structure. Trivial mitral valve regurgitation.  4. The aortic valve is normal in structure. Aortic valve regurgitation is not visualized. FINDINGS  Left Ventricle: Left ventricular ejection fraction, by estimation, is 45 to 50%. The left ventricle has mildly decreased function. The left ventricle has no regional wall motion abnormalities. The left ventricular internal cavity size was normal in size. There is no left ventricular hypertrophy. Left ventricular diastolic parameters were normal. Right Ventricle: The right ventricular size is normal. No increase in right ventricular wall thickness. Right ventricular systolic function is normal. There is normal pulmonary artery systolic pressure. The tricuspid regurgitant velocity is 2.13 m/s, and  with an assumed right atrial pressure of 10 mmHg, the estimated right ventricular systolic pressure is 75.6 mmHg. Left Atrium: Left atrial size was normal in size. Right Atrium: Right atrial size was normal in size. Pericardium: There is no evidence of pericardial effusion. Mitral Valve: The mitral valve is normal in structure. Trivial mitral valve regurgitation. Tricuspid Valve: The tricuspid valve is normal in structure. Tricuspid valve regurgitation is trivial. Aortic Valve: The aortic valve is normal in structure. Aortic valve regurgitation is not visualized. Aortic valve mean gradient measures 3.0 mmHg. Aortic valve peak gradient measures 5.1 mmHg. Aortic valve area, by VTI measures 3.18 cm. Pulmonic Valve: The pulmonic valve was normal in structure. Pulmonic valve regurgitation is trivial. Aorta: The aortic root and ascending  aorta are structurally normal, with no evidence of dilitation. IAS/Shunts: No atrial level shunt detected by color flow Doppler.  LEFT VENTRICLE PLAX 2D LVIDd:         4.96 cm  Diastology LVIDs:         3.98 cm  LV e' lateral:   5.87 cm/s LV PW:         1.28 cm  LV E/e' lateral:  9.3 LV IVS:        0.98 cm  LV e' medial:    6.20 cm/s LVOT diam:     2.10 cm  LV E/e' medial:  8.8 LV SV:         47 LV SV Index:   21 LVOT Area:     3.46 cm  RIGHT VENTRICLE RV Basal diam:  5.45 cm RV S prime:     14.70 cm/s TAPSE (M-mode): 3.8 cm LEFT ATRIUM              Index       RIGHT ATRIUM           Index LA diam:        3.30 cm  1.45 cm/m  RA Area:     25.00 cm LA Vol (A2C):   113.0 ml 49.65 ml/m RA Volume:   91.60 ml  40.24 ml/m LA Vol (A4C):   45.0 ml  19.77 ml/m LA Biplane Vol: 74.8 ml  32.86 ml/m  AORTIC VALVE                   PULMONIC VALVE AV Area (Vmax):    2.59 cm    PV Vmax:        0.69 m/s AV Area (Vmean):   2.58 cm    PV Peak grad:   1.9 mmHg AV Area (VTI):     3.18 cm    RVOT Peak grad: 2 mmHg AV Vmax:           112.50 cm/s AV Vmean:          80.800 cm/s AV VTI:            0.149 m AV Peak Grad:      5.1 mmHg AV Mean Grad:      3.0 mmHg LVOT Vmax:         84.20 cm/s LVOT Vmean:        60.300 cm/s LVOT VTI:          0.137 m LVOT/AV VTI ratio: 0.92  AORTA Ao Root diam: 4.10 cm MITRAL VALVE               TRICUSPID VALVE MV Area (PHT): 2.91 cm    TR Peak grad:   18.1 mmHg MV Decel Time: 261 msec    TR Vmax:        213.00 cm/s MV E velocity: 54.40 cm/s MV A velocity: 77.60 cm/s  SHUNTS MV E/A ratio:  0.70        Systemic VTI:  0.14 m                            Systemic Diam: 2.10 cm Serafina Royals MD Electronically signed by Serafina Royals MD Signature Date/Time: 06/22/2020/4:58:23 PM    Final    US SCROTUM W/DOPPLER  Result Date: 06/21/2020 CLINICAL DATA:  Scrotal swelling for 2 months. EXAM: SCROTAL ULTRASOUND DOPPLER ULTRASOUND OF THE TESTICLES TECHNIQUE: Complete ultrasound examination of the testicles,  epididymis, and other scrotal structures was performed. Color and spectral Doppler ultrasound were also utilized to evaluate blood flow to the testicles. COMPARISON:  None. FINDINGS: Right testicle Measurements: 3.4 x 2.8 x 2.5 cm. No mass or microlithiasis visualized. Left testicle Measurements: 3.3 x 2.7 x 2.6 cm. No mass or microlithiasis visualized. Right epididymis:  Normal in size and appearance. Left epididymis:  Normal in size and appearance. Hydrocele:  Small  bilateral hydroceles are noted. Varicocele:  Small left varicocele may be present. Pulsed Doppler interrogation of both testes demonstrates normal low resistance arterial and venous waveforms bilaterally. Bilateral scrotal wall swelling is noted. IMPRESSION: Bilateral scrotal wall swelling is noted concerning for inflammation or edema. No definite evidence of testicular mass or torsion is noted. Small bilateral hydroceles are noted. Electronically Signed   By: Marijo Conception M.D.   On: 06/21/2020 10:44    EKG: Normal sinus rhythm left atrial enlargement and poor R wave progression  Weights: Filed Weights   06/21/20 0914 06/22/20 0246 06/23/20 0456  Weight: 115 kg 111.2 kg 112.7 kg     Physical Exam: Blood pressure 101/86, pulse 80, temperature 97.7 F (36.5 C), temperature source Oral, resp. rate 19, height 5\' 10"  (1.778 m), weight 112.7 kg, SpO2 92 %. Body mass index is 35.65 kg/m. General: Well developed, well nourished, in no acute distress. Head eyes ears nose throat: Normocephalic, atraumatic, sclera non-icteric, no xanthomas, nares are without discharge. No apparent thyromegaly and/or mass  Lungs: Normal respiratory effort.  no wheezes, basilar rales, no rhonchi.  Heart: RRR with normal S1 S2. no murmur gallop, no rub, PMI is normal size and placement, carotid upstroke normal without bruit, jugular venous pressure is normal Abdomen: Soft, non-tender, non-distended with normoactive bowel sounds. No hepatomegaly. No  rebound/guarding. No obvious abdominal masses. Abdominal aorta is normal size without bruit Extremities: 2+ edema. no cyanosis, no clubbing, no ulcers  Peripheral : 2+ bilateral upper extremity pulses, 2+ bilateral femoral pulses, 2+ bilateral dorsal pedal pulse Neuro: Alert and oriented. No facial asymmetry. No focal deficit. Moves all extremities spontaneously. Musculoskeletal: Normal muscle tone without kyphosis Psych:  Responds to questions appropriately with a normal affect.    Assessment: 74 year old male with acute on chronic combined systolic diastolic dysfunction congestive heart failure secondary to LV dysfunction hypertension and chronic kidney disease and probable poor compliance with medications and diet without evidence of myocardial infarction  Plan: 1.  Continue intravenous Lasix 80mg  twice per day for significant lower extremity edema pulmonary edema and acute on chronic combined systolic diastolic dysfunction congestive heart failure following closely for concerns of worsening chronic kidney disease 2.  Echocardiogram for LV systolic dysfunction extent as well as pulmonary hypertension contributing to above with adjustments of medications thereafter 3.  Low-dose beta-blocker for congestive heart failure and increase slowly as able as initiated already 4.  Congestive heart failure counseling will ensue with low-sodium diet 5.  Further treatment options after above  Signed, Corey Skains M.D. [facility] 06/23/2020, 6:07 PM

## 2020-06-23 NOTE — Progress Notes (Signed)
[facility] PROGRESS NOTE   John [name] GUR:[id number] DOB: [birth year] DOA: 06/21/2020  PCP: Tracie Harrier, MD  Brief History/Interval Summary: 74 y.o. male with medical history significant for hypertension and coronary artery disease who presented to the emergency room for evaluation of generalized body swelling but mostly in the scrotal area and lower extremity.  Patient states that he has been compliant with his Lasix 80 mg daily without any improvement in the swelling.  He complains of difficulty voiding due to the scrotal swelling. He denies any dietary indiscretion.  Patient was admitted for further management of anasarca.  Reason for Visit: Anasarca  Consultants: None  Procedures: Transthoracic echocardiogram   Antibiotics: Anti-infectives (From admission, onward)   None      Subjective/Interval History: Patient states that that his swelling in the legs and scrotum persists.  Denies any chest pain.  Some shortness of breath occasionally.    Assessment/Plan:  Anasarca/acute systolic CHF Thought to be due to congestive heart failure.  Echocardiogram shows his systolic function to be low with a EF of 45%.  It appears that patient was seen by Dr. Nehemiah Massed in his office back in December.  Plan was to do stress test then.  Unclear if he ever underwent that study.  Am not able to see any reports in the EMR.  We'll go ahead and consult cardiology.  His albumin level is 3.4.  Does not have any known history of liver disease.  Lower extremity Doppler study on June 30 did not show any DVT.  Seen by his primary care provider on June 29 for similar issue.  At that time he was noted to be on amlodipine which was discontinued.  Started on 80 mg of Lasix daily.  There is mention of proteinuria and apparently referral was made to nephrology.  On June 22 UA did show mild proteinuria but UA done here in this hospital does not show any proteinuria.  UA also does not show any  proteinuria. TSH 1.29.  Acute on chronic kidney disease stage III Creatinine noted to be 1.95 at admission.  It was around 1.3 a few months ago.  Renal function has been improving with creatinine down to 1.49 today.  Continue diuretics.  Monitor ins and outs.  Daily weights. Per PCP notes from June he was supposed to be referred to nephrology.  No clear indication for nephrology consultation here in the hospital.  Ultrasound of the abdomen does show medical renal disease.  Cholelithiasis Incidentally noted on ultrasound.  He is asymptomatic.  His transaminases were normal.  Bilirubin was 1.6.  Alkaline phosphatase normal.  Scrotal swelling Likely secondary to anasarca.  Scrotal ultrasound reviewed.  Bilateral hydrocele noted.  Scrotal wall thickening was noted.  No other acute abnormalities.    Essential hypertension Amlodipine recently discontinued by PCP.  Monitor blood pressures closely.  Looks like his ARB and HCTZ were also recently discontinued.  Currently on metoprolol.  Blood pressure is reasonably well controlled.  Acute on chronic respiratory failure with hypoxia/history of COPD Noted to be on oxygen at 2 L/min.  Apparently uses oxygen at home only as needed.  Chest x-ray showed pulmonary vascular congestion and some atelectasis.  Unclear if he uses any inhalers at home for his COPD.  Wean down oxygen as tolerated.  History of ascending aortic aneurysm Followed in the outpatient setting for same.  Abdominal aortic aneurysm Noted on ultrasound.  CT scan recommended.  Due to elevated creatinine we'll hold off on it for  now.  He does not have any abdominal pain.  Macrocytosis Hemoglobin is normal.  TSH is normal.  B12 level is 461.  Folate is 15.2.  DVT Prophylaxis: Lovenox Code Status: Full code Family Communication: Discussed with the patient Disposition Plan:  Status is: Inpatient  Remains inpatient appropriate because:IV treatments appropriate due to intensity of illness or  inability to take PO   Dispo: The patient is from: Home              Anticipated d/c is to: Home              Anticipated d/c date is: 2 days              Patient currently is not medically stable to d/c.      Medications:  Scheduled:  aspirin EC  325 mg Oral Daily   enoxaparin (LOVENOX) injection  40 mg Subcutaneous Q24H   furosemide  80 mg Intravenous BID   metoprolol succinate  25 mg Oral Daily   sodium chloride flush  3 mL Intravenous Q12H   Continuous:  sodium chloride     FWY:OVZCHY chloride, acetaminophen, ondansetron (ZOFRAN) IV, sodium chloride flush   Objective:  Vital Signs  Vitals:   06/22/20 1930 06/23/20 0456 06/23/20 0834 06/23/20 1154  BP: 106/78 (!) 148/101 (!) 153/90 116/80  Pulse: 92 86 87 89  Resp: 18 15 19 19   Temp: 98 F (36.7 C) 98.4 F (36.9 C) 98.5 F (36.9 C) 98.6 F (37 C)  TempSrc: Oral  Oral Oral  SpO2: 90% 97% 92% 97%  Weight:  112.7 kg    Height:        Intake/Output Summary (Last 24 hours) at 06/23/2020 1246 Last data filed at 06/23/2020 0950 Gross per 24 hour  Intake 960 ml  Output 1100 ml  Net -140 ml   Filed Weights   06/21/20 0914 06/22/20 0246 06/23/20 0456  Weight: 115 kg 111.2 kg 112.7 kg    General appearance: Awake alert.  In no distress Resp: Normal effort at rest.  No definite wheezing or crackles.   Cardio: S1-S2 is normal regular.  No S3-S4.  No rubs murmurs or bruit GI: Abdomen is soft.  Nontender nondistended.  Bowel sounds are present normal.  No masses organomegaly Extremities: 2-3+ pitting edema bilateral lower extremities extending all the way to the scrotal area.  Neurologic: Alert and oriented x3.  No focal neurological deficits.    Lab Results:  Data Reviewed: I have personally reviewed following labs and imaging studies  CBC: Recent Labs  Lab 06/21/20 0925 06/23/20 0734  WBC 9.0 8.1  NEUTROABS 6.8  --   HGB 16.5 16.9  HCT 52.8* 56.0*  MCV 103.3* 106.1*  PLT 142* 132*    Basic  Metabolic Panel: Recent Labs  Lab 06/21/20 0925 06/22/20 0417 06/23/20 0734  NA 142 143 144  K 4.2 4.5 4.4  CL 97* 97* 94*  CO2 34* 33* 37*  GLUCOSE 119* 114* 105*  BUN 34* 29* 23  CREATININE 1.95* 1.69* 1.49*  CALCIUM 9.0 8.9 9.0  MG 2.1  --   --     GFR: Estimated Creatinine Clearance: 54.7 mL/min (A) (by C-G formula based on SCr of 1.49 mg/dL (H)).  Liver Function Tests: Recent Labs  Lab 06/21/20 0925  AST 30  ALT 39  ALKPHOS 61  BILITOT 1.6*  PROT 5.9*  ALBUMIN 3.4*     Recent Results (from the past 240 hour(s))  SARS Coronavirus 2  by RT PCR (hospital order, performed in [facility] hospital lab) Nasopharyngeal Nasopharyngeal Swab     Status: None   Collection Time: 06/21/20 10:24 AM   Specimen: Nasopharyngeal Swab  Result Value Ref Range Status   SARS Coronavirus 2 NEGATIVE NEGATIVE Final    Comment: (NOTE) SARS-CoV-2 target nucleic acids are NOT DETECTED.  The SARS-CoV-2 RNA is generally detectable in upper and lower respiratory specimens during the acute phase of infection. The lowest concentration of SARS-CoV-2 viral copies this assay can detect is 250 copies / mL. A negative result does not preclude SARS-CoV-2 infection and should not be used as the sole basis for treatment or other patient management decisions.  A negative result may occur with improper specimen collection / handling, submission of specimen other than nasopharyngeal swab, presence of viral mutation(s) within the areas targeted by this assay, and inadequate number of viral copies (<250 copies / mL). A negative result must be combined with clinical observations, patient history, and epidemiological information.  Fact Sheet for Patients:   StrictlyIdeas.no  Fact Sheet for Healthcare Providers: BankingDealers.co.za  This test is not yet approved or  cleared by the [place] FDA and has been authorized for detection and/or diagnosis of  SARS-CoV-2 by FDA under an Emergency Use Authorization (EUA).  This EUA will remain in effect (meaning this test can be used) for the duration of the COVID-19 declaration under Section 564(b)(1) of the Act, 21 U.S.C. section 360bbb-3(b)(1), unless the authorization is terminated or revoked sooner.  Performed at [facility], [address], [place], [place] [address]       Radiology Studies: US Abdomen Complete  Result Date: 06/23/2020 CLINICAL DATA:  Hyperbilirubinemia, acute renal failure EXAM: ABDOMEN ULTRASOUND COMPLETE COMPARISON:  None. FINDINGS: Gallbladder: There is layering sludge within the gallbladder, however, the gallbladder is not distended, there is no gallbladder wall thickening, and no pericholecystic fluid is identified. The sonographic Percell Miller sign is reportedly negative. Common bile duct: Diameter: 5 mm in proximal diameter. The distal duct is obscured by overlying bowel gas. Liver: No focal lesion identified. Within normal limits in parenchymal echogenicity. Portal vein is patent on color Doppler imaging with normal direction of blood flow towards the liver. IVC: No abnormality visualized. Pancreas: Visualized portion unremarkable. Spleen: Size and appearance within normal limits. Right Kidney: Length: 9.9 cm (see image 28). Renal cortical thickness is preserved. Cortical echogenicity, however, is diffusely increased in keeping with changes of underlying medical renal disease. There is no hydronephrosis. No intrarenal mass or calcification is identified. Left Kidney: Length: 10.8 cm. Renal cortical thickness is preserved. Cortical echogenicity, however, is diffusely increased in keeping with changes of underlying medical renal disease. There is no hydronephrosis. No intrarenal mass or calcification is identified. Abdominal aorta: A 3.2 cm aneurysm is seen of the mid abdominal aorta. The distal aorta is obscured by overlying bowel gas. Other findings: No ascites  IMPRESSION: Cholelithiasis without sonographic evidence of acute cholecystitis. No intra or extrahepatic biliary ductal dilation. Diffusely increased renal cortical echogenicity suggesting changes of underlying medical renal disease. Incomplete evaluation of the abdominal aorta demonstrating a minimum 3.2 cm juxtarenal/infrarenal abdominal aortic aneurysm. This would be better assessed with dedicated CT imaging. Electronically Signed   By: Fidela Salisbury MD   On: 06/23/2020 08:38   ECHOCARDIOGRAM COMPLETE  Result Date: 06/22/2020    ECHOCARDIOGRAM REPORT   Patient Name:   John [name] Date of Exam: 06/22/2020 Medical Rec #:  [id number]      Height:  70.0 in Accession #:    [id number]     Weight:       245.2 lb Date of Birth:  [birth year]     BSA:          2.276 m Patient Age:    53 years       BP:           140/104 mmHg Patient Gender: M              HR:           82 bpm. Exam Location:  [facility] Procedure: 2D Echo, Cardiac Doppler and Color Doppler Indications:     CHF- acute diastolic 390.30  History:         Patient has no prior history of Echocardiogram examinations.                  Risk Factors:Hypertension.  Sonographer:     Sherrie Sport RDCS (AE) Referring Phys:  [place] Diagnosing Phys: Serafina Royals MD IMPRESSIONS  1. Left ventricular ejection fraction, by estimation, is 45 to 50%. The left ventricle has mildly decreased function. The left ventricle has no regional wall motion abnormalities. Left ventricular diastolic parameters were normal.  2. Right ventricular systolic function is normal. The right ventricular size is normal. There is normal pulmonary artery systolic pressure.  3. The mitral valve is normal in structure. Trivial mitral valve regurgitation.  4. The aortic valve is normal in structure. Aortic valve regurgitation is not visualized. FINDINGS  Left Ventricle: Left ventricular ejection fraction, by estimation, is 45 to 50%. The left ventricle has mildly decreased function.  The left ventricle has no regional wall motion abnormalities. The left ventricular internal cavity size was normal in size. There is no left ventricular hypertrophy. Left ventricular diastolic parameters were normal. Right Ventricle: The right ventricular size is normal. No increase in right ventricular wall thickness. Right ventricular systolic function is normal. There is normal pulmonary artery systolic pressure. The tricuspid regurgitant velocity is 2.13 m/s, and  with an assumed right atrial pressure of 10 mmHg, the estimated right ventricular systolic pressure is 09.2 mmHg. Left Atrium: Left atrial size was normal in size. Right Atrium: Right atrial size was normal in size. Pericardium: There is no evidence of pericardial effusion. Mitral Valve: The mitral valve is normal in structure. Trivial mitral valve regurgitation. Tricuspid Valve: The tricuspid valve is normal in structure. Tricuspid valve regurgitation is trivial. Aortic Valve: The aortic valve is normal in structure. Aortic valve regurgitation is not visualized. Aortic valve mean gradient measures 3.0 mmHg. Aortic valve peak gradient measures 5.1 mmHg. Aortic valve area, by VTI measures 3.18 cm. Pulmonic Valve: The pulmonic valve was normal in structure. Pulmonic valve regurgitation is trivial. Aorta: The aortic root and ascending aorta are structurally normal, with no evidence of dilitation. IAS/Shunts: No atrial level shunt detected by color flow Doppler.  LEFT VENTRICLE PLAX 2D LVIDd:         4.96 cm  Diastology LVIDs:         3.98 cm  LV e' lateral:   5.87 cm/s LV PW:         1.28 cm  LV E/e' lateral: 9.3 LV IVS:        0.98 cm  LV e' medial:    6.20 cm/s LVOT diam:     2.10 cm  LV E/e' medial:  8.8 LV SV:         47 LV SV Index:  21 LVOT Area:     3.46 cm  RIGHT VENTRICLE RV Basal diam:  5.45 cm RV S prime:     14.70 cm/s TAPSE (M-mode): 3.8 cm LEFT ATRIUM              Index       RIGHT ATRIUM           Index LA diam:        3.30 cm  1.45  cm/m  RA Area:     25.00 cm LA Vol (A2C):   113.0 ml 49.65 ml/m RA Volume:   91.60 ml  40.24 ml/m LA Vol (A4C):   45.0 ml  19.77 ml/m LA Biplane Vol: 74.8 ml  32.86 ml/m  AORTIC VALVE                   PULMONIC VALVE AV Area (Vmax):    2.59 cm    PV Vmax:        0.69 m/s AV Area (Vmean):   2.58 cm    PV Peak grad:   1.9 mmHg AV Area (VTI):     3.18 cm    RVOT Peak grad: 2 mmHg AV Vmax:           112.50 cm/s AV Vmean:          80.800 cm/s AV VTI:            0.149 m AV Peak Grad:      5.1 mmHg AV Mean Grad:      3.0 mmHg LVOT Vmax:         84.20 cm/s LVOT Vmean:        60.300 cm/s LVOT VTI:          0.137 m LVOT/AV VTI ratio: 0.92  AORTA Ao Root diam: 4.10 cm MITRAL VALVE               TRICUSPID VALVE MV Area (PHT): 2.91 cm    TR Peak grad:   18.1 mmHg MV Decel Time: 261 msec    TR Vmax:        213.00 cm/s MV E velocity: 54.40 cm/s MV A velocity: 77.60 cm/s  SHUNTS MV E/A ratio:  0.70        Systemic VTI:  0.14 m                            Systemic Diam: 2.10 cm Serafina Royals MD Electronically signed by Serafina Royals MD Signature Date/Time: 06/22/2020/4:58:23 PM    Final        LOS: 1 day   [facility] Pager on [email or web address]  06/23/2020, 12:46 PM

## 2020-06-24 DIAGNOSIS — I5041 Acute combined systolic (congestive) and diastolic (congestive) heart failure: Secondary | ICD-10-CM | POA: Diagnosis not present

## 2020-06-24 DIAGNOSIS — I10 Essential (primary) hypertension: Secondary | ICD-10-CM | POA: Diagnosis not present

## 2020-06-24 DIAGNOSIS — N179 Acute kidney failure, unspecified: Secondary | ICD-10-CM | POA: Diagnosis not present

## 2020-06-24 LAB — BASIC METABOLIC PANEL
Anion gap: 15 (ref 5–15)
BUN: 20 mg/dL (ref 8–23)
CO2: 38 mmol/L — ABNORMAL HIGH (ref 22–32)
Calcium: 8.9 mg/dL (ref 8.9–10.3)
Chloride: 90 mmol/L — ABNORMAL LOW (ref 98–111)
Creatinine, Ser: 1.44 mg/dL — ABNORMAL HIGH (ref 0.61–1.24)
GFR calc Af Amer: 55 mL/min — ABNORMAL LOW (ref >60)
GFR calc non Af Amer: 47 mL/min — ABNORMAL LOW (ref >60)
Glucose, Bld: 104 mg/dL — ABNORMAL HIGH (ref 70–99)
Potassium: 4.1 mmol/L (ref 3.5–5.1)
Sodium: 143 mmol/L (ref 135–145)

## 2020-06-24 MED ORDER — METOPROLOL SUCCINATE ER 50 MG PO TB24
50.0000 mg | ORAL_TABLET | Freq: Every day | ORAL | Status: DC
  Administered 2020-06-24 – 2020-07-03 (×10): 50 mg via ORAL
  Filled 2020-06-24 (×10): qty 1

## 2020-06-24 MED ORDER — FUROSEMIDE 10 MG/ML IJ SOLN
80.0000 mg | Freq: Once | INTRAMUSCULAR | Status: AC
  Administered 2020-06-24: 80 mg via INTRAVENOUS
  Filled 2020-06-24: qty 8

## 2020-06-24 MED ORDER — POLYETHYLENE GLYCOL 3350 17 G PO PACK
17.0000 g | PACK | Freq: Every day | ORAL | Status: DC
  Administered 2020-06-24 – 2020-06-30 (×5): 17 g via ORAL
  Filled 2020-06-24 (×9): qty 1

## 2020-06-24 NOTE — Progress Notes (Signed)
[facility] Encounter Note  Patient: John [name] / Admit Date: 06/21/2020 / Date of Encounter: 06/24/2020, 8:29 AM   Subjective: No significant change in overall edema weight and/or shortness of breath but patient feels slightly better since admission. Patient does have slight improvement in pulmonary Rales. No current evidence of myocardial infarction with a troponin of 157 consistent with demand ischemia. Echocardiogram has shown no significant evidence of worsening LV systolic dysfunction with low normal to slightly hypokinetic global dysfunction and ejection fraction of 45%. This is unchanged from before. It appears that the patient has multiple reasons for heart failure including dietary indiscretion chronic kidney disease and LV systolic dysfunction  Review of Systems: Positive for: Shortness of breath edema Negative for: Vision change, hearing change, syncope, dizziness, nausea, vomiting,diarrhea, bloody stool, stomach pain, cough, congestion, diaphoresis, urinary frequency, urinary pain,skin lesions, skin rashes Others previously listed  Objective: Telemetry: Normal sinus rhythm Physical Exam: Blood pressure 133/86, pulse 88, temperature 97.9 F (36.6 C), temperature source Oral, resp. rate 20, height 5\' 10"  (1.778 m), weight 108.5 kg, SpO2 94 %. Body mass index is 34.34 kg/m. General: Well developed, well nourished, in no acute distress. Head: Normocephalic, atraumatic, sclera non-icteric, no xanthomas, nares are without discharge. Neck: No apparent masses Lungs: Normal respirations with n some o wheezes, no rhonchi, no rales , few crackles   Heart: Regular rate and rhythm, normal S1 S2, no murmur, no rub, no gallop, PMI is normal size and placement, carotid upstroke normal without bruit, jugular venous pressure normal Abdomen: Soft, non-tender, non-distended with normoactive bowel sounds. No hepatosplenomegaly. Abdominal aorta is normal size without  bruit Extremities: 2+ edema, no clubbing, no cyanosis, no ulcers,  Peripheral: 2+ radial, 2+ femoral, zero+ dorsal pedal pulses Neuro: Alert and oriented. Moves all extremities spontaneously. Psych:  Responds to questions appropriately with a normal affect.   Intake/Output Summary (Last 24 hours) at 06/24/2020 0829 Last data filed at 06/24/2020 0421 Gross per 24 hour  Intake 480 ml  Output 2950 ml  Net -2470 ml    Inpatient Medications:  . aspirin EC  325 mg Oral Daily  . enoxaparin (LOVENOX) injection  40 mg Subcutaneous Q24H  . furosemide  80 mg Intravenous Q12H  . metoprolol succinate  25 mg Oral Daily  . polyethylene glycol  17 g Oral Daily  . sodium chloride flush  3 mL Intravenous Q12H   Infusions:  . sodium chloride      Labs: Recent Labs    06/21/20 0925 06/22/20 0417 06/23/20 0734 06/24/20 0458  NA 142   < > 144 143  K 4.2   < > 4.4 4.1  CL 97*   < > 94* 90*  CO2 34*   < > 37* 38*  GLUCOSE 119*   < > 105* 104*  BUN 34*   < > 23 20  CREATININE 1.95*   < > 1.49* 1.44*  CALCIUM 9.0   < > 9.0 8.9  MG 2.1  --   --   --    < > = values in this interval not displayed.   Recent Labs    06/21/20 0925  AST 30  ALT 39  ALKPHOS 61  BILITOT 1.6*  PROT 5.9*  ALBUMIN 3.4*   Recent Labs    06/21/20 0925 06/23/20 0734  WBC 9.0 8.1  NEUTROABS 6.8  --   HGB 16.5 16.9  HCT 52.8* 56.0*  MCV 103.3* 106.1*  PLT 142* 132*   No results for  input(s): CKTOTAL, CKMB, TROPONINI in the last 72 hours. Invalid input(s): POCBNP No results for input(s): HGBA1C in the last 72 hours.   Weights: Filed Weights   06/22/20 0246 06/23/20 0456 06/24/20 0421  Weight: 111.2 kg 112.7 kg 108.5 kg     Radiology/Studies:  US Abdomen Complete  Result Date: 06/23/2020 CLINICAL DATA:  Hyperbilirubinemia, acute renal failure EXAM: ABDOMEN ULTRASOUND COMPLETE COMPARISON:  None. FINDINGS: Gallbladder: There is layering sludge within the gallbladder, however, the gallbladder is not  distended, there is no gallbladder wall thickening, and no pericholecystic fluid is identified. The sonographic Percell Miller sign is reportedly negative. Common bile duct: Diameter: 5 mm in proximal diameter. The distal duct is obscured by overlying bowel gas. Liver: No focal lesion identified. Within normal limits in parenchymal echogenicity. Portal vein is patent on color Doppler imaging with normal direction of blood flow towards the liver. IVC: No abnormality visualized. Pancreas: Visualized portion unremarkable. Spleen: Size and appearance within normal limits. Right Kidney: Length: 9.9 cm (see image 28). Renal cortical thickness is preserved. Cortical echogenicity, however, is diffusely increased in keeping with changes of underlying medical renal disease. There is no hydronephrosis. No intrarenal mass or calcification is identified. Left Kidney: Length: 10.8 cm. Renal cortical thickness is preserved. Cortical echogenicity, however, is diffusely increased in keeping with changes of underlying medical renal disease. There is no hydronephrosis. No intrarenal mass or calcification is identified. Abdominal aorta: A 3.2 cm aneurysm is seen of the mid abdominal aorta. The distal aorta is obscured by overlying bowel gas. Other findings: No ascites IMPRESSION: Cholelithiasis without sonographic evidence of acute cholecystitis. No intra or extrahepatic biliary ductal dilation. Diffusely increased renal cortical echogenicity suggesting changes of underlying medical renal disease. Incomplete evaluation of the abdominal aorta demonstrating a minimum 3.2 cm juxtarenal/infrarenal abdominal aortic aneurysm. This would be better assessed with dedicated CT imaging. Electronically Signed   By: Fidela Salisbury MD   On: 06/23/2020 08:38   US Venous Img Lower Bilateral (DVT)  Result Date: 06/14/2020 CLINICAL DATA:  Bilateral lower extremity swelling EXAM: BILATERAL LOWER EXTREMITY VENOUS DOPPLER ULTRASOUND TECHNIQUE: Gray-scale  sonography with graded compression, as well as color Doppler and duplex ultrasound were performed to evaluate the lower extremity deep venous systems from the level of the common femoral vein and including the common femoral, femoral, profunda femoral, popliteal and calf veins including the posterior tibial, peroneal and gastrocnemius veins when visible. The superficial great saphenous vein was also interrogated. Spectral Doppler was utilized to evaluate flow at rest and with distal augmentation maneuvers in the common femoral, femoral and popliteal veins. COMPARISON:  None. FINDINGS: RIGHT LOWER EXTREMITY Common Femoral Vein: No evidence of thrombus. Normal compressibility, respiratory phasicity and response to augmentation. Saphenofemoral Junction: No evidence of thrombus. Normal compressibility and flow on color Doppler imaging. Profunda Femoral Vein: No evidence of thrombus. Normal compressibility and flow on color Doppler imaging. Femoral Vein: No evidence of thrombus. Normal compressibility, respiratory phasicity and response to augmentation. Popliteal Vein: No evidence of thrombus. Normal compressibility, respiratory phasicity and response to augmentation. Calf Veins: Right calf veins are poorly visualized secondary to edema. No gross thrombus evident. LEFT LOWER EXTREMITY Common Femoral Vein: No evidence of thrombus. Normal compressibility, respiratory phasicity and response to augmentation. Saphenofemoral Junction: No evidence of thrombus. Normal compressibility and flow on color Doppler imaging. Profunda Femoral Vein: No evidence of thrombus. Normal compressibility and flow on color Doppler imaging. Femoral Vein: No evidence of thrombus. Normal compressibility, respiratory phasicity and response to augmentation. Popliteal Vein:  No evidence of thrombus. Normal compressibility, respiratory phasicity and response to augmentation. Calf Veins: No evidence of thrombus. Normal compressibility and flow on color  Doppler imaging. IMPRESSION: No significant DVT in either extremity. Limited assessment of the right calf veins. Electronically Signed   By: Jerilynn Mages.  Shick M.D.   On: 06/14/2020 12:52   DG Chest Portable 1 View  Result Date: 06/21/2020 CLINICAL DATA:  Body swelling for awhile, scrotal swelling for 2 days, hypertension EXAM: PORTABLE CHEST 1 VIEW COMPARISON:  Portable exam 0944 hours compared to 02/16/2020 FINDINGS: Enlargement of cardiac silhouette with pulmonary vascular congestion. Mediastinal contours normal with mild atherosclerotic calcification aorta. RIGHT basilar atelectasis. Remaining lungs clear. No pleural effusion or pneumothorax. IMPRESSION: Enlargement of cardiac silhouette with pulmonary vascular congestion. RIGHT basilar atelectasis. Electronically Signed   By: Lavonia Dana M.D.   On: 06/21/2020 10:00   ECHOCARDIOGRAM COMPLETE  Result Date: 06/22/2020    ECHOCARDIOGRAM REPORT   Patient Name:   John [name] Date of Exam: 06/22/2020 Medical Rec #:  [id number]      Height:       70.0 in Accession #:    [id number]     Weight:       245.2 lb Date of Birth:  [birth year]     BSA:          2.276 m Patient Age:    74 years       BP:           140/104 mmHg Patient Gender: M              HR:           82 bpm. Exam Location:  [facility] Procedure: 2D Echo, Cardiac Doppler and Color Doppler Indications:     CHF- acute diastolic 970.26  History:         Patient has no prior history of Echocardiogram examinations.                  Risk Factors:Hypertension.  Sonographer:     Sherrie Sport RDCS (AE) Referring Phys:  [place] Diagnosing Phys: Serafina Royals MD IMPRESSIONS  1. Left ventricular ejection fraction, by estimation, is 45 to 50%. The left ventricle has mildly decreased function. The left ventricle has no regional wall motion abnormalities. Left ventricular diastolic parameters were normal.  2. Right ventricular systolic function is normal. The right ventricular size is normal. There is normal pulmonary  artery systolic pressure.  3. The mitral valve is normal in structure. Trivial mitral valve regurgitation.  4. The aortic valve is normal in structure. Aortic valve regurgitation is not visualized. FINDINGS  Left Ventricle: Left ventricular ejection fraction, by estimation, is 45 to 50%. The left ventricle has mildly decreased function. The left ventricle has no regional wall motion abnormalities. The left ventricular internal cavity size was normal in size. There is no left ventricular hypertrophy. Left ventricular diastolic parameters were normal. Right Ventricle: The right ventricular size is normal. No increase in right ventricular wall thickness. Right ventricular systolic function is normal. There is normal pulmonary artery systolic pressure. The tricuspid regurgitant velocity is 2.13 m/s, and  with an assumed right atrial pressure of 10 mmHg, the estimated right ventricular systolic pressure is 37.8 mmHg. Left Atrium: Left atrial size was normal in size. Right Atrium: Right atrial size was normal in size. Pericardium: There is no evidence of pericardial effusion. Mitral Valve: The mitral valve is normal in structure. Trivial mitral valve regurgitation. Tricuspid Valve: The tricuspid  valve is normal in structure. Tricuspid valve regurgitation is trivial. Aortic Valve: The aortic valve is normal in structure. Aortic valve regurgitation is not visualized. Aortic valve mean gradient measures 3.0 mmHg. Aortic valve peak gradient measures 5.1 mmHg. Aortic valve area, by VTI measures 3.18 cm. Pulmonic Valve: The pulmonic valve was normal in structure. Pulmonic valve regurgitation is trivial. Aorta: The aortic root and ascending aorta are structurally normal, with no evidence of dilitation. IAS/Shunts: No atrial level shunt detected by color flow Doppler.  LEFT VENTRICLE PLAX 2D LVIDd:         4.96 cm  Diastology LVIDs:         3.98 cm  LV e' lateral:   5.87 cm/s LV PW:         1.28 cm  LV E/e' lateral: 9.3 LV IVS:         0.98 cm  LV e' medial:    6.20 cm/s LVOT diam:     2.10 cm  LV E/e' medial:  8.8 LV SV:         47 LV SV Index:   21 LVOT Area:     3.46 cm  RIGHT VENTRICLE RV Basal diam:  5.45 cm RV S prime:     14.70 cm/s TAPSE (M-mode): 3.8 cm LEFT ATRIUM              Index       RIGHT ATRIUM           Index LA diam:        3.30 cm  1.45 cm/m  RA Area:     25.00 cm LA Vol (A2C):   113.0 ml 49.65 ml/m RA Volume:   91.60 ml  40.24 ml/m LA Vol (A4C):   45.0 ml  19.77 ml/m LA Biplane Vol: 74.8 ml  32.86 ml/m  AORTIC VALVE                   PULMONIC VALVE AV Area (Vmax):    2.59 cm    PV Vmax:        0.69 m/s AV Area (Vmean):   2.58 cm    PV Peak grad:   1.9 mmHg AV Area (VTI):     3.18 cm    RVOT Peak grad: 2 mmHg AV Vmax:           112.50 cm/s AV Vmean:          80.800 cm/s AV VTI:            0.149 m AV Peak Grad:      5.1 mmHg AV Mean Grad:      3.0 mmHg LVOT Vmax:         84.20 cm/s LVOT Vmean:        60.300 cm/s LVOT VTI:          0.137 m LVOT/AV VTI ratio: 0.92  AORTA Ao Root diam: 4.10 cm MITRAL VALVE               TRICUSPID VALVE MV Area (PHT): 2.91 cm    TR Peak grad:   18.1 mmHg MV Decel Time: 261 msec    TR Vmax:        213.00 cm/s MV E velocity: 54.40 cm/s MV A velocity: 77.60 cm/s  SHUNTS MV E/A ratio:  0.70        Systemic VTI:  0.14 m  Systemic Diam: 2.10 cm Serafina Royals MD Electronically signed by Serafina Royals MD Signature Date/Time: 06/22/2020/4:58:23 PM    Final    US SCROTUM W/DOPPLER  Result Date: 06/21/2020 CLINICAL DATA:  Scrotal swelling for 2 months. EXAM: SCROTAL ULTRASOUND DOPPLER ULTRASOUND OF THE TESTICLES TECHNIQUE: Complete ultrasound examination of the testicles, epididymis, and other scrotal structures was performed. Color and spectral Doppler ultrasound were also utilized to evaluate blood flow to the testicles. COMPARISON:  None. FINDINGS: Right testicle Measurements: 3.4 x 2.8 x 2.5 cm. No mass or microlithiasis visualized. Left testicle Measurements:  3.3 x 2.7 x 2.6 cm. No mass or microlithiasis visualized. Right epididymis:  Normal in size and appearance. Left epididymis:  Normal in size and appearance. Hydrocele:  Small bilateral hydroceles are noted. Varicocele:  Small left varicocele may be present. Pulsed Doppler interrogation of both testes demonstrates normal low resistance arterial and venous waveforms bilaterally. Bilateral scrotal wall swelling is noted. IMPRESSION: Bilateral scrotal wall swelling is noted concerning for inflammation or edema. No definite evidence of testicular mass or torsion is noted. Small bilateral hydroceles are noted. Electronically Signed   By: Marijo Conception M.D.   On: 06/21/2020 10:44     Assessment and Recommendation  74 y.o. male with acute on chronic combined systolic dysfunction and diastolic dysfunction congestive heart failure more consistent with dietary indiscretion chronic kidney disease and structural functional cardiovascular abnormalities now slightly improved 1. Continue intravenous Lasix at current dose at 80 mg injection twice per day 2. No further cardiac diagnostics necessary at this time 3. Increasing metoprolol for better blood pressure control and concerns for treatment of systolic dysfunction congestive heart failure 4. Patient will need congestive heart failure clinic consultation for dietary issues and congestive heart failure concerns Signed, Serafina Royals M.D. FACC

## 2020-06-24 NOTE — Progress Notes (Signed)
[facility] PROGRESS NOTE   John [name] ZOX:[id number] DOB: May 20, 1946 DOA: 06/21/2020  PCP: Tracie Harrier, MD  Brief History/Interval Summary: 74 y.o. male with medical history significant for hypertension and coronary artery disease who presented to the emergency room for evaluation of generalized body swelling but mostly in the scrotal area and lower extremity.  Patient states that he has been compliant with his Lasix 80 mg daily without any improvement in the swelling.  He complains of difficulty voiding due to the scrotal swelling. He denies any dietary indiscretion.  Patient was admitted for further management of anasarca.  Reason for Visit: Anasarca  Consultants: None  Procedures: Transthoracic echocardiogram done.  See below for report.  Antibiotics: Anti-infectives (From admission, onward)   None      Subjective/Interval History: Patient mentions that he is feeling slightly better.  His scrotal edema as well as his leg edema seems to be improving.  Denies any chest pain.  No shortness of breath.  Assessment/Plan:  Anasarca/Acute systolic and diastolic CHF His fluid overload is primarily secondary to congestive heart failure.  Echocardiogram shows systolic and diastolic dysfunction.  EF is around 45%.  Patient seen by cardiology. Continue with furosemide.  Give additional dose in the afternoon today.  He did diurese well yesterday with the additional dose.  His weight is also decreasing. Patient also has evidence of medical renal disease on the ultrasound.  He does have elevated creatinine.  Apparently he was supposed to be seen by nephrology in the outpatient setting.  Will defer these issues to his PCP.  No proteinuria identified on UA here in the hospital.  Lower extremity Doppler studies were negative for DVT.  No history of liver disease.  Acute on chronic kidney disease stage III Creatinine noted to be 1.95 at admission.  It was around 1.3 a few months  ago.   Patient's creatinine has been improving with the furosemide.  Creatinine down to 1.44.  He has good urine output.  Potassium is 4.1.  Diuretics.  Monitor urine output.  Per PCP notes from June he was supposed to be referred to nephrology.  No clear indication for nephrology consultation here in the hospital.  Ultrasound of the abdomen does show medical renal disease.  Cholelithiasis Incidentally noted on ultrasound.  He is asymptomatic.  His transaminases were normal.  Bilirubin was 1.6.  Alkaline phosphatase normal.  Scrotal swelling Likely secondary to anasarca.  Scrotal ultrasound reviewed.  Bilateral hydrocele noted.  Scrotal wall thickening was noted.  No other acute abnormalities.  Scrotal swelling is improving per patient.  Essential hypertension Amlodipine recently discontinued by PCP.  His ARB and HCTZ was also discontinued recently.  Continue beta-blocker for now.  Blood pressure is reasonably well controlled.    Acute on chronic respiratory failure with hypoxia/history of COPD Noted to be on oxygen at 2 L/min.  Apparently uses oxygen at home only as needed.  Chest x-ray showed pulmonary vascular congestion and some atelectasis.  Unclear if he uses any inhalers at home for his COPD.  Wean down oxygen as tolerated.  History of ascending aortic aneurysm Followed in the outpatient setting for same.  Abdominal aortic aneurysm Noted on ultrasound.  CT scan recommended.  Due to elevated creatinine we'll hold off on it for now.  He does not have any abdominal pain.  This can be pursued in the outpatient setting.  Macrocytosis Hemoglobin is normal.  TSH is normal.  B12 level is 461.  Folate is 15.2.  DVT  Prophylaxis: Lovenox Code Status: Full code Family Communication: Discussed with the patient Disposition Plan:  Status is: Inpatient  Remains inpatient appropriate because:IV treatments appropriate due to intensity of illness or inability to take PO   Dispo:  Patient From:  Home  Planned Disposition: Home  Expected discharge date: 06/26/20  Medically stable for discharge: No     Medications:  Scheduled: . aspirin EC  325 mg Oral Daily  . enoxaparin (LOVENOX) injection  40 mg Subcutaneous Q24H  . furosemide  80 mg Intravenous Q12H  . metoprolol succinate  50 mg Oral Daily  . polyethylene glycol  17 g Oral Daily  . sodium chloride flush  3 mL Intravenous Q12H   Continuous: . sodium chloride     NAT:FTDDUK chloride, acetaminophen, ondansetron (ZOFRAN) IV, sodium chloride flush   Objective:  Vital Signs  Vitals:   06/23/20 1546 06/23/20 1920 06/23/20 2100 06/24/20 0421  BP: 101/86 133/86    Pulse: 80 88    Resp: 19 20    Temp: 97.7 F (36.5 C) 97.9 F (36.6 C)    TempSrc: Oral Oral    SpO2: 92% 92% 94%   Weight:    108.5 kg  Height:        Intake/Output Summary (Last 24 hours) at 06/24/2020 1002 Last data filed at 06/24/2020 0957 Gross per 24 hour  Intake 360 ml  Output 2550 ml  Net -2190 ml   Filed Weights   06/22/20 0246 06/23/20 0456 06/24/20 0421  Weight: 111.2 kg 112.7 kg 108.5 kg    General appearance: Awake alert.  In no distress Resp: Clear to auscultation bilaterally.  Normal effort Cardio: S1-S2 is normal regular.  No S3-S4.  No rubs murmurs or bruit GI: Abdomen is soft.  Nontender nondistended.  Bowel sounds are present normal.  No masses organomegaly Extremities: 2-3+ pitting edema bilateral lower extremities extending all the way to the scrotal area.  Slightly better today compared to yesterday.   Neurologic: Alert and oriented x3.  No focal neurological deficits.   Lab Results:  Data Reviewed: I have personally reviewed following labs and imaging studies  CBC: Recent Labs  Lab 06/21/20 0925 06/23/20 0734  WBC 9.0 8.1  NEUTROABS 6.8  --   HGB 16.5 16.9  HCT 52.8* 56.0*  MCV 103.3* 106.1*  PLT 142* 132*    Basic Metabolic Panel: Recent Labs  Lab 06/21/20 0925 06/22/20 0417 06/23/20 0734  06/24/20 0458  NA 142 143 144 143  K 4.2 4.5 4.4 4.1  CL 97* 97* 94* 90*  CO2 34* 33* 37* 38*  GLUCOSE 119* 114* 105* 104*  BUN 34* 29* 23 20  CREATININE 1.95* 1.69* 1.49* 1.44*  CALCIUM 9.0 8.9 9.0 8.9  MG 2.1  --   --   --     GFR: Estimated Creatinine Clearance: 55.5 mL/min (A) (by C-G formula based on SCr of 1.44 mg/dL (H)).  Liver Function Tests: Recent Labs  Lab 06/21/20 0925  AST 30  ALT 39  ALKPHOS 61  BILITOT 1.6*  PROT 5.9*  ALBUMIN 3.4*     Recent Results (from the past 240 hour(s))  SARS Coronavirus 2 by RT PCR (hospital order, performed in [facility] hospital lab) Nasopharyngeal Nasopharyngeal Swab     Status: None   Collection Time: 06/21/20 10:24 AM   Specimen: Nasopharyngeal Swab  Result Value Ref Range Status   SARS Coronavirus 2 NEGATIVE NEGATIVE Final    Comment: (NOTE) SARS-CoV-2 target nucleic acids are NOT DETECTED.  The SARS-CoV-2 RNA is generally detectable in upper and lower respiratory specimens during the acute phase of infection. The lowest concentration of SARS-CoV-2 viral copies this assay can detect is 250 copies / mL. A negative result does not preclude SARS-CoV-2 infection and should not be used as the sole basis for treatment or other patient management decisions.  A negative result may occur with improper specimen collection / handling, submission of specimen other than nasopharyngeal swab, presence of viral mutation(s) within the areas targeted by this assay, and inadequate number of viral copies (<250 copies / mL). A negative result must be combined with clinical observations, patient history, and epidemiological information.  Fact Sheet for Patients:   StrictlyIdeas.no  Fact Sheet for Healthcare Providers: BankingDealers.co.za  This test is not yet approved or  cleared by the [place] FDA and has been authorized for detection and/or diagnosis of SARS-CoV-2 by FDA under  an Emergency Use Authorization (EUA).  This EUA will remain in effect (meaning this test can be used) for the duration of the COVID-19 declaration under Section 564(b)(1) of the Act, 21 U.S.C. section 360bbb-3(b)(1), unless the authorization is terminated or revoked sooner.  Performed at [facility], [address] [place], [place] [address]       Radiology Studies: US Abdomen Complete  Result Date: 06/23/2020 CLINICAL DATA:  Hyperbilirubinemia, acute renal failure EXAM: ABDOMEN ULTRASOUND COMPLETE COMPARISON:  None. FINDINGS: Gallbladder: There is layering sludge within the gallbladder, however, the gallbladder is not distended, there is no gallbladder wall thickening, and no pericholecystic fluid is identified. The sonographic Percell Miller sign is reportedly negative. Common bile duct: Diameter: 5 mm in proximal diameter. The distal duct is obscured by overlying bowel gas. Liver: No focal lesion identified. Within normal limits in parenchymal echogenicity. Portal vein is patent on color Doppler imaging with normal direction of blood flow towards the liver. IVC: No abnormality visualized. Pancreas: Visualized portion unremarkable. Spleen: Size and appearance within normal limits. Right Kidney: Length: 9.9 cm (see image 28). Renal cortical thickness is preserved. Cortical echogenicity, however, is diffusely increased in keeping with changes of underlying medical renal disease. There is no hydronephrosis. No intrarenal mass or calcification is identified. Left Kidney: Length: 10.8 cm. Renal cortical thickness is preserved. Cortical echogenicity, however, is diffusely increased in keeping with changes of underlying medical renal disease. There is no hydronephrosis. No intrarenal mass or calcification is identified. Abdominal aorta: A 3.2 cm aneurysm is seen of the mid abdominal aorta. The distal aorta is obscured by overlying bowel gas. Other findings: No ascites IMPRESSION: Cholelithiasis without  sonographic evidence of acute cholecystitis. No intra or extrahepatic biliary ductal dilation. Diffusely increased renal cortical echogenicity suggesting changes of underlying medical renal disease. Incomplete evaluation of the abdominal aorta demonstrating a minimum 3.2 cm juxtarenal/infrarenal abdominal aortic aneurysm. This would be better assessed with dedicated CT imaging. Electronically Signed   By: Fidela Salisbury MD   On: 06/23/2020 08:38   ECHOCARDIOGRAM COMPLETE  Result Date: 06/22/2020    ECHOCARDIOGRAM REPORT   Patient Name:   John [name] Date of Exam: 06/22/2020 Medical Rec #:  [id number]      Height:       70.0 in Accession #:    [id number]     Weight:       245.2 lb Date of Birth:  [birth year]     BSA:          2.276 m Patient Age:    48 years  BP:           140/104 mmHg Patient Gender: M              HR:           82 bpm. Exam Location:  [facility] Procedure: 2D Echo, Cardiac Doppler and Color Doppler Indications:     CHF- acute diastolic 737.10  History:         Patient has no prior history of Echocardiogram examinations.                  Risk Factors:Hypertension.  Sonographer:     Sherrie Sport RDCS (AE) Referring Phys:  [place] Diagnosing Phys: Serafina Royals MD IMPRESSIONS  1. Left ventricular ejection fraction, by estimation, is 45 to 50%. The left ventricle has mildly decreased function. The left ventricle has no regional wall motion abnormalities. Left ventricular diastolic parameters were normal.  2. Right ventricular systolic function is normal. The right ventricular size is normal. There is normal pulmonary artery systolic pressure.  3. The mitral valve is normal in structure. Trivial mitral valve regurgitation.  4. The aortic valve is normal in structure. Aortic valve regurgitation is not visualized. FINDINGS  Left Ventricle: Left ventricular ejection fraction, by estimation, is 45 to 50%. The left ventricle has mildly decreased function. The left ventricle has no regional  wall motion abnormalities. The left ventricular internal cavity size was normal in size. There is no left ventricular hypertrophy. Left ventricular diastolic parameters were normal. Right Ventricle: The right ventricular size is normal. No increase in right ventricular wall thickness. Right ventricular systolic function is normal. There is normal pulmonary artery systolic pressure. The tricuspid regurgitant velocity is 2.13 m/s, and  with an assumed right atrial pressure of 10 mmHg, the estimated right ventricular systolic pressure is 62.6 mmHg. Left Atrium: Left atrial size was normal in size. Right Atrium: Right atrial size was normal in size. Pericardium: There is no evidence of pericardial effusion. Mitral Valve: The mitral valve is normal in structure. Trivial mitral valve regurgitation. Tricuspid Valve: The tricuspid valve is normal in structure. Tricuspid valve regurgitation is trivial. Aortic Valve: The aortic valve is normal in structure. Aortic valve regurgitation is not visualized. Aortic valve mean gradient measures 3.0 mmHg. Aortic valve peak gradient measures 5.1 mmHg. Aortic valve area, by VTI measures 3.18 cm. Pulmonic Valve: The pulmonic valve was normal in structure. Pulmonic valve regurgitation is trivial. Aorta: The aortic root and ascending aorta are structurally normal, with no evidence of dilitation. IAS/Shunts: No atrial level shunt detected by color flow Doppler.  LEFT VENTRICLE PLAX 2D LVIDd:         4.96 cm  Diastology LVIDs:         3.98 cm  LV e' lateral:   5.87 cm/s LV PW:         1.28 cm  LV E/e' lateral: 9.3 LV IVS:        0.98 cm  LV e' medial:    6.20 cm/s LVOT diam:     2.10 cm  LV E/e' medial:  8.8 LV SV:         47 LV SV Index:   21 LVOT Area:     3.46 cm  RIGHT VENTRICLE RV Basal diam:  5.45 cm RV S prime:     14.70 cm/s TAPSE (M-mode): 3.8 cm LEFT ATRIUM              Index       RIGHT  ATRIUM           Index LA diam:        3.30 cm  1.45 cm/m  RA Area:     25.00 cm LA Vol  (A2C):   113.0 ml 49.65 ml/m RA Volume:   91.60 ml  40.24 ml/m LA Vol (A4C):   45.0 ml  19.77 ml/m LA Biplane Vol: 74.8 ml  32.86 ml/m  AORTIC VALVE                   PULMONIC VALVE AV Area (Vmax):    2.59 cm    PV Vmax:        0.69 m/s AV Area (Vmean):   2.58 cm    PV Peak grad:   1.9 mmHg AV Area (VTI):     3.18 cm    RVOT Peak grad: 2 mmHg AV Vmax:           112.50 cm/s AV Vmean:          80.800 cm/s AV VTI:            0.149 m AV Peak Grad:      5.1 mmHg AV Mean Grad:      3.0 mmHg LVOT Vmax:         84.20 cm/s LVOT Vmean:        60.300 cm/s LVOT VTI:          0.137 m LVOT/AV VTI ratio: 0.92  AORTA Ao Root diam: 4.10 cm MITRAL VALVE               TRICUSPID VALVE MV Area (PHT): 2.91 cm    TR Peak grad:   18.1 mmHg MV Decel Time: 261 msec    TR Vmax:        213.00 cm/s MV E velocity: 54.40 cm/s MV A velocity: 77.60 cm/s  SHUNTS MV E/A ratio:  0.70        Systemic VTI:  0.14 m                            Systemic Diam: 2.10 cm Serafina Royals MD Electronically signed by Serafina Royals MD Signature Date/Time: 06/22/2020/4:58:23 PM    Final        LOS: 2 days   [facility] Pager on [email or web address]  06/24/2020, 10:02 AM

## 2020-06-24 NOTE — Plan of Care (Signed)
  Problem: Education: Goal: Knowledge of General Education information will improve Description Including pain rating scale, medication(s)/side effects and non-pharmacologic comfort measures Outcome: Progressing   

## 2020-06-25 DIAGNOSIS — I10 Essential (primary) hypertension: Secondary | ICD-10-CM | POA: Diagnosis not present

## 2020-06-25 DIAGNOSIS — I5041 Acute combined systolic (congestive) and diastolic (congestive) heart failure: Secondary | ICD-10-CM | POA: Diagnosis not present

## 2020-06-25 LAB — BASIC METABOLIC PANEL
Anion gap: 8 (ref 5–15)
BUN: 19 mg/dL (ref 8–23)
CO2: 43 mmol/L — ABNORMAL HIGH (ref 22–32)
Calcium: 8.8 mg/dL — ABNORMAL LOW (ref 8.9–10.3)
Chloride: 91 mmol/L — ABNORMAL LOW (ref 98–111)
Creatinine, Ser: 1.49 mg/dL — ABNORMAL HIGH (ref 0.61–1.24)
GFR calc Af Amer: 53 mL/min — ABNORMAL LOW (ref >60)
GFR calc non Af Amer: 46 mL/min — ABNORMAL LOW (ref >60)
Glucose, Bld: 105 mg/dL — ABNORMAL HIGH (ref 70–99)
Potassium: 4.1 mmol/L (ref 3.5–5.1)
Sodium: 142 mmol/L (ref 135–145)

## 2020-06-25 NOTE — Progress Notes (Signed)
[facility] PROGRESS NOTE   John [name] TML:[id number] DOB: [birth year] DOA: 06/21/2020  PCP: Tracie Harrier, MD  Brief History/Interval Summary: 74 y.o. male with medical history significant for hypertension and coronary artery disease who presented to the emergency room for evaluation of generalized body swelling but mostly in the scrotal area and lower extremity.  Patient states that he has been compliant with his Lasix 80 mg daily without any improvement in the swelling.  He complains of difficulty voiding due to the scrotal swelling. He denies any dietary indiscretion.  Patient was admitted for further management of anasarca.  Reason for Visit: Anasarca  Consultants: None  Procedures: Transthoracic echocardiogram done.  See below for report.  Antibiotics: Anti-infectives (From admission, onward)   None      Subjective/Interval History: Patient wondering when he can go home.  He states that his legs and scrotal area is still swollen though he does feel better and feels that the swelling is improving.  Denies any chest pain or shortness of breath.    Assessment/Plan:  Anasarca/Acute systolic and diastolic CHF His fluid overload is primarily secondary to congestive heart failure.  Echocardiogram shows systolic and diastolic dysfunction.  EF is around 45%.  Patient seen by cardiology. Patient also has evidence of medical renal disease on the ultrasound.  He does have elevated creatinine.  Apparently he was supposed to be seen by nephrology in the outpatient setting.  Will defer these issues to his PCP.  No proteinuria identified on UA here in the hospital.  Lower extremity Doppler studies were negative for DVT.  No history of liver disease. Patient remains on IV diuretics.  Cardiology continues to follow.  Patient has diuresed well.  His weight has decreased.  Still has significant edema in his lower extremities and scrotal area.  Patient was educated about diuretics and  he was told that it may take a long time for him to lose all this extra fluid.    Acute on chronic kidney disease stage III Creatinine noted to be 1.95 at admission.  It was around 1.3 a few months ago.   Per PCP notes from June he was supposed to be referred to nephrology.  Ultrasound of the abdomen does show medical renal disease. Patient's renal function remained stable.  He is developing some contraction alkalosis.  Will not be given extra dose of Lasix today.  Continue to monitor urine output.    Cholelithiasis Incidentally noted on ultrasound.  He is asymptomatic.  His transaminases were normal.  Bilirubin was 1.6.  Alkaline phosphatase normal.  Scrotal swelling Likely secondary to anasarca.  Scrotal ultrasound reviewed.  Bilateral hydrocele noted.  Scrotal wall thickening was noted.  No other acute abnormalities.  Scrotal swelling is improving per patient.  Essential hypertension Amlodipine recently discontinued by PCP.  His ARB and HCTZ was also discontinued recently.  He remains on metoprolol.  Low reading was noted yesterday.  He was asymptomatic.  Continue to monitor.  Acute on chronic respiratory failure with hypoxia/history of COPD Noted to be on oxygen at 2 L/min.  Apparently uses oxygen at home only as needed.  Chest x-ray showed pulmonary vascular congestion and some atelectasis.  Unclear if he uses any inhalers at home for his COPD.  Wean down oxygen as tolerated.  History of ascending aortic aneurysm Followed in the outpatient setting for same.  Abdominal aortic aneurysm Noted on ultrasound.  CT scan recommended.  Due to elevated creatinine we'll hold off on it for now.  He does not have any abdominal pain.  This can be pursued in the outpatient setting.  Macrocytosis Hemoglobin is normal.  TSH is normal.  B12 level is 461.  Folate is 15.2.  DVT Prophylaxis: Lovenox Code Status: Full code Family Communication: Discussed with the patient Disposition Plan:  Status is:  Inpatient  Remains inpatient appropriate because:IV treatments appropriate due to intensity of illness or inability to take PO   Dispo:  Patient From: Home  Planned Disposition: Home  Expected discharge date: 06/26/20  Medically stable for discharge: No     Medications:  Scheduled: . aspirin EC  325 mg Oral Daily  . enoxaparin (LOVENOX) injection  40 mg Subcutaneous Q24H  . furosemide  80 mg Intravenous Q12H  . metoprolol succinate  50 mg Oral Daily  . polyethylene glycol  17 g Oral Daily  . sodium chloride flush  3 mL Intravenous Q12H   Continuous: . sodium chloride     LHT:DSKAJG chloride, acetaminophen, ondansetron (ZOFRAN) IV, sodium chloride flush   Objective:  Vital Signs  Vitals:   06/24/20 1554 06/24/20 2016 06/25/20 0533 06/25/20 0739  BP: (!) 88/68 104/76 114/77 116/83  Pulse: 86 (!) 41 83 84  Resp: 16 20 20 18   Temp: 97.7 F (36.5 C) 98.3 F (36.8 C) 97.9 F (36.6 C) 98.3 F (36.8 C)  TempSrc:  Oral Oral Oral  SpO2: 97% 91% 92% 97%  Weight:   107.8 kg   Height:        Intake/Output Summary (Last 24 hours) at 06/25/2020 1203 Last data filed at 06/24/2020 2151 Gross per 24 hour  Intake 3 ml  Output 650 ml  Net -647 ml   Filed Weights   06/23/20 0456 06/24/20 0421 06/25/20 0533  Weight: 112.7 kg 108.5 kg 107.8 kg   General appearance: Awake alert.  In no distress Resp: Clear to auscultation bilaterally.  Normal effort Cardio: S1-S2 is normal regular.  No S3-S4.  No rubs murmurs or bruit GI: Abdomen is soft.  Nontender nondistended.  Bowel sounds are present normal.  No masses organomegaly Extremities: 2-3+ pitting edema bilateral lower extremities extending to the scrotal area.  Slightly better compared to admission.   Neurologic: Alert and oriented x3.  No focal neurological deficits.    Lab Results:  Data Reviewed: I have personally reviewed following labs and imaging studies  CBC: Recent Labs  Lab 06/21/20 0925 06/23/20 0734  WBC  9.0 8.1  NEUTROABS 6.8  --   HGB 16.5 16.9  HCT 52.8* 56.0*  MCV 103.3* 106.1*  PLT 142* 132*    Basic Metabolic Panel: Recent Labs  Lab 06/21/20 0925 06/22/20 0417 06/23/20 0734 06/24/20 0458 06/25/20 0433  NA 142 143 144 143 142  K 4.2 4.5 4.4 4.1 4.1  CL 97* 97* 94* 90* 91*  CO2 34* 33* 37* 38* 43*  GLUCOSE 119* 114* 105* 104* 105*  BUN 34* 29* 23 20 19   CREATININE 1.95* 1.69* 1.49* 1.44* 1.49*  CALCIUM 9.0 8.9 9.0 8.9 8.8*  MG 2.1  --   --   --   --     GFR: Estimated Creatinine Clearance: 53.5 mL/min (A) (by C-G formula based on SCr of 1.49 mg/dL (H)).  Liver Function Tests: Recent Labs  Lab 06/21/20 0925  AST 30  ALT 39  ALKPHOS 61  BILITOT 1.6*  PROT 5.9*  ALBUMIN 3.4*     Recent Results (from the past 240 hour(s))  SARS Coronavirus 2 by RT PCR (hospital order,  performed in [facility] hospital lab) Nasopharyngeal Nasopharyngeal Swab     Status: None   Collection Time: 06/21/20 10:24 AM   Specimen: Nasopharyngeal Swab  Result Value Ref Range Status   SARS Coronavirus 2 NEGATIVE NEGATIVE Final    Comment: (NOTE) SARS-CoV-2 target nucleic acids are NOT DETECTED.  The SARS-CoV-2 RNA is generally detectable in upper and lower respiratory specimens during the acute phase of infection. The lowest concentration of SARS-CoV-2 viral copies this assay can detect is 250 copies / mL. A negative result does not preclude SARS-CoV-2 infection and should not be used as the sole basis for treatment or other patient management decisions.  A negative result may occur with improper specimen collection / handling, submission of specimen other than nasopharyngeal swab, presence of viral mutation(s) within the areas targeted by this assay, and inadequate number of viral copies (<250 copies / mL). A negative result must be combined with clinical observations, patient history, and epidemiological information.  Fact Sheet for Patients:     StrictlyIdeas.no  Fact Sheet for Healthcare Providers: BankingDealers.co.za  This test is not yet approved or  cleared by the [place] FDA and has been authorized for detection and/or diagnosis of SARS-CoV-2 by FDA under an Emergency Use Authorization (EUA).  This EUA will remain in effect (meaning this test can be used) for the duration of the COVID-19 declaration under Section 564(b)(1) of the Act, 21 U.S.C. section 360bbb-3(b)(1), unless the authorization is terminated or revoked sooner.  Performed at [facility], [address] [place], [place] [address]       Radiology Studies: No results found.     LOS: 3 days   Ashantia Amaral [employer] on [email or web address]  06/25/2020, 12:03 PM

## 2020-06-25 NOTE — Progress Notes (Signed)
[facility] Encounter Note  Patient: John [name] / Admit Date: 06/21/2020 / Date of Encounter: 06/25/2020, 10:23 AM   Subjective: Patient has slight improvements in weight shortness of breath and lower extremity edema.  Patient does have slight improvement in pulmonary Rales. No current evidence of myocardial infarction with a troponin of 157 consistent with demand ischemia. Echocardiogram has shown no significant evidence of worsening LV systolic dysfunction with low normal to slightly hypokinetic global dysfunction and ejection fraction of 45%. This is unchanged from before. It appears that the patient has multiple reasons for heart failure including dietary indiscretion chronic kidney disease and LV systolic dysfunction for which have been a chronic problem.  Review of Systems: Positive for: Shortness of breath edema Negative for: Vision change, hearing change, syncope, dizziness, nausea, vomiting,diarrhea, bloody stool, stomach pain, cough, congestion, diaphoresis, urinary frequency, urinary pain,skin lesions, skin rashes Others previously listed  Objective: Telemetry: Normal sinus rhythm Physical Exam: Blood pressure 114/77, pulse 83, temperature 97.9 F (36.6 C), temperature source Oral, resp. rate 20, height 5\' 10"  (1.778 m), weight 107.8 kg, SpO2 92 %. Body mass index is 34.11 kg/m. General: Well developed, well nourished, in no acute distress. Head: Normocephalic, atraumatic, sclera non-icteric, no xanthomas, nares are without discharge. Neck: No apparent masses Lungs: Normal respirations with n some o wheezes, no rhonchi, no rales , few crackles   Heart: Regular rate and rhythm, normal S1 S2, no murmur, no rub, no gallop, PMI is normal size and placement, carotid upstroke normal without bruit, jugular venous pressure normal Abdomen: Soft, non-tender, non-distended with normoactive bowel sounds. No hepatosplenomegaly. Abdominal aorta is normal size without  bruit Extremities: 2+ edema, no clubbing, no cyanosis, no ulcers,  Peripheral: 2+ radial, 2+ femoral, zero+ dorsal pedal pulses Neuro: Alert and oriented. Moves all extremities spontaneously. Psych:  Responds to questions appropriately with a normal affect.   Intake/Output Summary (Last 24 hours) at 06/25/2020 1023 Last data filed at 06/24/2020 2151 Gross per 24 hour  Intake 3 ml  Output 1050 ml  Net -1047 ml    Inpatient Medications:  . aspirin EC  325 mg Oral Daily  . enoxaparin (LOVENOX) injection  40 mg Subcutaneous Q24H  . furosemide  80 mg Intravenous Q12H  . metoprolol succinate  50 mg Oral Daily  . polyethylene glycol  17 g Oral Daily  . sodium chloride flush  3 mL Intravenous Q12H   Infusions:  . sodium chloride      Labs: Recent Labs    06/24/20 0458 06/25/20 0433  NA 143 142  K 4.1 4.1  CL 90* 91*  CO2 38* 43*  GLUCOSE 104* 105*  BUN 20 19  CREATININE 1.44* 1.49*  CALCIUM 8.9 8.8*   No results for input(s): AST, ALT, ALKPHOS, BILITOT, PROT, ALBUMIN in the last 72 hours. Recent Labs    06/23/20 0734  WBC 8.1  HGB 16.9  HCT 56.0*  MCV 106.1*  PLT 132*   No results for input(s): CKTOTAL, CKMB, TROPONINI in the last 72 hours. Invalid input(s): POCBNP No results for input(s): HGBA1C in the last 72 hours.   Weights: Filed Weights   06/23/20 0456 06/24/20 0421 06/25/20 0533  Weight: 112.7 kg 108.5 kg 107.8 kg     Radiology/Studies:  US Abdomen Complete  Result Date: 06/23/2020 CLINICAL DATA:  Hyperbilirubinemia, acute renal failure EXAM: ABDOMEN ULTRASOUND COMPLETE COMPARISON:  None. FINDINGS: Gallbladder: There is layering sludge within the gallbladder, however, the gallbladder is not distended, there is no gallbladder  wall thickening, and no pericholecystic fluid is identified. The sonographic Percell Miller sign is reportedly negative. Common bile duct: Diameter: 5 mm in proximal diameter. The distal duct is obscured by overlying bowel gas. Liver: No focal  lesion identified. Within normal limits in parenchymal echogenicity. Portal vein is patent on color Doppler imaging with normal direction of blood flow towards the liver. IVC: No abnormality visualized. Pancreas: Visualized portion unremarkable. Spleen: Size and appearance within normal limits. Right Kidney: Length: 9.9 cm (see image 28). Renal cortical thickness is preserved. Cortical echogenicity, however, is diffusely increased in keeping with changes of underlying medical renal disease. There is no hydronephrosis. No intrarenal mass or calcification is identified. Left Kidney: Length: 10.8 cm. Renal cortical thickness is preserved. Cortical echogenicity, however, is diffusely increased in keeping with changes of underlying medical renal disease. There is no hydronephrosis. No intrarenal mass or calcification is identified. Abdominal aorta: A 3.2 cm aneurysm is seen of the mid abdominal aorta. The distal aorta is obscured by overlying bowel gas. Other findings: No ascites IMPRESSION: Cholelithiasis without sonographic evidence of acute cholecystitis. No intra or extrahepatic biliary ductal dilation. Diffusely increased renal cortical echogenicity suggesting changes of underlying medical renal disease. Incomplete evaluation of the abdominal aorta demonstrating a minimum 3.2 cm juxtarenal/infrarenal abdominal aortic aneurysm. This would be better assessed with dedicated CT imaging. Electronically Signed   By: Fidela Salisbury MD   On: 06/23/2020 08:38   US Venous Img Lower Bilateral (DVT)  Result Date: 06/14/2020 CLINICAL DATA:  Bilateral lower extremity swelling EXAM: BILATERAL LOWER EXTREMITY VENOUS DOPPLER ULTRASOUND TECHNIQUE: Gray-scale sonography with graded compression, as well as color Doppler and duplex ultrasound were performed to evaluate the lower extremity deep venous systems from the level of the common femoral vein and including the common femoral, femoral, profunda femoral, popliteal and calf  veins including the posterior tibial, peroneal and gastrocnemius veins when visible. The superficial great saphenous vein was also interrogated. Spectral Doppler was utilized to evaluate flow at rest and with distal augmentation maneuvers in the common femoral, femoral and popliteal veins. COMPARISON:  None. FINDINGS: RIGHT LOWER EXTREMITY Common Femoral Vein: No evidence of thrombus. Normal compressibility, respiratory phasicity and response to augmentation. Saphenofemoral Junction: No evidence of thrombus. Normal compressibility and flow on color Doppler imaging. Profunda Femoral Vein: No evidence of thrombus. Normal compressibility and flow on color Doppler imaging. Femoral Vein: No evidence of thrombus. Normal compressibility, respiratory phasicity and response to augmentation. Popliteal Vein: No evidence of thrombus. Normal compressibility, respiratory phasicity and response to augmentation. Calf Veins: Right calf veins are poorly visualized secondary to edema. No gross thrombus evident. LEFT LOWER EXTREMITY Common Femoral Vein: No evidence of thrombus. Normal compressibility, respiratory phasicity and response to augmentation. Saphenofemoral Junction: No evidence of thrombus. Normal compressibility and flow on color Doppler imaging. Profunda Femoral Vein: No evidence of thrombus. Normal compressibility and flow on color Doppler imaging. Femoral Vein: No evidence of thrombus. Normal compressibility, respiratory phasicity and response to augmentation. Popliteal Vein: No evidence of thrombus. Normal compressibility, respiratory phasicity and response to augmentation. Calf Veins: No evidence of thrombus. Normal compressibility and flow on color Doppler imaging. IMPRESSION: No significant DVT in either extremity. Limited assessment of the right calf veins. Electronically Signed   By: Jerilynn Mages.  Shick M.D.   On: 06/14/2020 12:52   DG Chest Portable 1 View  Result Date: 06/21/2020 CLINICAL DATA:  Body swelling for  awhile, scrotal swelling for 2 days, hypertension EXAM: PORTABLE CHEST 1 VIEW COMPARISON:  Portable exam [phone]  hours compared to 02/16/2020 FINDINGS: Enlargement of cardiac silhouette with pulmonary vascular congestion. Mediastinal contours normal with mild atherosclerotic calcification aorta. RIGHT basilar atelectasis. Remaining lungs clear. No pleural effusion or pneumothorax. IMPRESSION: Enlargement of cardiac silhouette with pulmonary vascular congestion. RIGHT basilar atelectasis. Electronically Signed   By: Lavonia Dana M.D.   On: 06/21/2020 10:00   ECHOCARDIOGRAM COMPLETE  Result Date: 06/22/2020    ECHOCARDIOGRAM REPORT   Patient Name:   John [name] Date of Exam: 06/22/2020 Medical Rec #:  [id number]      Height:       70.0 in Accession #:    [id number]     Weight:       245.2 lb Date of Birth:  [birth year]     BSA:          2.276 m Patient Age:    1 years       BP:           140/104 mmHg Patient Gender: M              HR:           82 bpm. Exam Location:  [facility] Procedure: 2D Echo, Cardiac Doppler and Color Doppler Indications:     CHF- acute diastolic 951.88  History:         Patient has no prior history of Echocardiogram examinations.                  Risk Factors:Hypertension.  Sonographer:     Sherrie Sport RDCS (AE) Referring Phys:  [place] Diagnosing Phys: Serafina Royals MD IMPRESSIONS  1. Left ventricular ejection fraction, by estimation, is 45 to 50%. The left ventricle has mildly decreased function. The left ventricle has no regional wall motion abnormalities. Left ventricular diastolic parameters were normal.  2. Right ventricular systolic function is normal. The right ventricular size is normal. There is normal pulmonary artery systolic pressure.  3. The mitral valve is normal in structure. Trivial mitral valve regurgitation.  4. The aortic valve is normal in structure. Aortic valve regurgitation is not visualized. FINDINGS  Left Ventricle: Left ventricular ejection fraction, by  estimation, is 45 to 50%. The left ventricle has mildly decreased function. The left ventricle has no regional wall motion abnormalities. The left ventricular internal cavity size was normal in size. There is no left ventricular hypertrophy. Left ventricular diastolic parameters were normal. Right Ventricle: The right ventricular size is normal. No increase in right ventricular wall thickness. Right ventricular systolic function is normal. There is normal pulmonary artery systolic pressure. The tricuspid regurgitant velocity is 2.13 m/s, and  with an assumed right atrial pressure of 10 mmHg, the estimated right ventricular systolic pressure is 41.6 mmHg. Left Atrium: Left atrial size was normal in size. Right Atrium: Right atrial size was normal in size. Pericardium: There is no evidence of pericardial effusion. Mitral Valve: The mitral valve is normal in structure. Trivial mitral valve regurgitation. Tricuspid Valve: The tricuspid valve is normal in structure. Tricuspid valve regurgitation is trivial. Aortic Valve: The aortic valve is normal in structure. Aortic valve regurgitation is not visualized. Aortic valve mean gradient measures 3.0 mmHg. Aortic valve peak gradient measures 5.1 mmHg. Aortic valve area, by VTI measures 3.18 cm. Pulmonic Valve: The pulmonic valve was normal in structure. Pulmonic valve regurgitation is trivial. Aorta: The aortic root and ascending aorta are structurally normal, with no evidence of dilitation. IAS/Shunts: No atrial level shunt detected by color flow Doppler.  LEFT VENTRICLE  PLAX 2D LVIDd:         4.96 cm  Diastology LVIDs:         3.98 cm  LV e' lateral:   5.87 cm/s LV PW:         1.28 cm  LV E/e' lateral: 9.3 LV IVS:        0.98 cm  LV e' medial:    6.20 cm/s LVOT diam:     2.10 cm  LV E/e' medial:  8.8 LV SV:         47 LV SV Index:   21 LVOT Area:     3.46 cm  RIGHT VENTRICLE RV Basal diam:  5.45 cm RV S prime:     14.70 cm/s TAPSE (M-mode): 3.8 cm LEFT ATRIUM               Index       RIGHT ATRIUM           Index LA diam:        3.30 cm  1.45 cm/m  RA Area:     25.00 cm LA Vol (A2C):   113.0 ml 49.65 ml/m RA Volume:   91.60 ml  40.24 ml/m LA Vol (A4C):   45.0 ml  19.77 ml/m LA Biplane Vol: 74.8 ml  32.86 ml/m  AORTIC VALVE                   PULMONIC VALVE AV Area (Vmax):    2.59 cm    PV Vmax:        0.69 m/s AV Area (Vmean):   2.58 cm    PV Peak grad:   1.9 mmHg AV Area (VTI):     3.18 cm    RVOT Peak grad: 2 mmHg AV Vmax:           112.50 cm/s AV Vmean:          80.800 cm/s AV VTI:            0.149 m AV Peak Grad:      5.1 mmHg AV Mean Grad:      3.0 mmHg LVOT Vmax:         84.20 cm/s LVOT Vmean:        60.300 cm/s LVOT VTI:          0.137 m LVOT/AV VTI ratio: 0.92  AORTA Ao Root diam: 4.10 cm MITRAL VALVE               TRICUSPID VALVE MV Area (PHT): 2.91 cm    TR Peak grad:   18.1 mmHg MV Decel Time: 261 msec    TR Vmax:        213.00 cm/s MV E velocity: 54.40 cm/s MV A velocity: 77.60 cm/s  SHUNTS MV E/A ratio:  0.70        Systemic VTI:  0.14 m                            Systemic Diam: 2.10 cm Serafina Royals MD Electronically signed by Serafina Royals MD Signature Date/Time: 06/22/2020/4:58:23 PM    Final    US SCROTUM W/DOPPLER  Result Date: 06/21/2020 CLINICAL DATA:  Scrotal swelling for 2 months. EXAM: SCROTAL ULTRASOUND DOPPLER ULTRASOUND OF THE TESTICLES TECHNIQUE: Complete ultrasound examination of the testicles, epididymis, and other scrotal structures was performed. Color and spectral Doppler ultrasound were also utilized to evaluate blood flow to the testicles. COMPARISON:  None. FINDINGS: Right testicle Measurements: 3.4 x 2.8 x 2.5 cm. No mass or microlithiasis visualized. Left testicle Measurements: 3.3 x 2.7 x 2.6 cm. No mass or microlithiasis visualized. Right epididymis:  Normal in size and appearance. Left epididymis:  Normal in size and appearance. Hydrocele:  Small bilateral hydroceles are noted. Varicocele:  Small left varicocele may be present.  Pulsed Doppler interrogation of both testes demonstrates normal low resistance arterial and venous waveforms bilaterally. Bilateral scrotal wall swelling is noted. IMPRESSION: Bilateral scrotal wall swelling is noted concerning for inflammation or edema. No definite evidence of testicular mass or torsion is noted. Small bilateral hydroceles are noted. Electronically Signed   By: Marijo Conception M.D.   On: 06/21/2020 10:44     Assessment and Recommendation  74 y.o. male with acute on chronic combined systolic dysfunction and diastolic dysfunction congestive heart failure more consistent with dietary indiscretion chronic kidney disease and structural functional cardiovascular abnormalities now slightly improved 1. Continue intravenous Lasix at current dose at 80 mg injection twice per day and would consider nephrology consultation if necessary if patient continues to have slow diuresis or concerns 2. No further cardiac diagnostics necessary at this time 3.  Increased metoprolol dose has helped with heart rate and blood pressure control at this time 4. Patient will need congestive heart failure clinic consultation for dietary issues and congestive heart failure concerns due to concerns that the patient may have reoccurrence if not having enough further education Signed, Serafina Royals M.D. FACC

## 2020-06-26 DIAGNOSIS — I10 Essential (primary) hypertension: Secondary | ICD-10-CM | POA: Diagnosis not present

## 2020-06-26 DIAGNOSIS — I5041 Acute combined systolic (congestive) and diastolic (congestive) heart failure: Secondary | ICD-10-CM | POA: Diagnosis not present

## 2020-06-26 LAB — BASIC METABOLIC PANEL
Anion gap: 12 (ref 5–15)
BUN: 19 mg/dL (ref 8–23)
CO2: 38 mmol/L — ABNORMAL HIGH (ref 22–32)
Calcium: 9 mg/dL (ref 8.9–10.3)
Chloride: 90 mmol/L — ABNORMAL LOW (ref 98–111)
Creatinine, Ser: 1.46 mg/dL — ABNORMAL HIGH (ref 0.61–1.24)
GFR calc Af Amer: 54 mL/min — ABNORMAL LOW (ref >60)
GFR calc non Af Amer: 47 mL/min — ABNORMAL LOW (ref >60)
Glucose, Bld: 86 mg/dL (ref 70–99)
Potassium: 3.9 mmol/L (ref 3.5–5.1)
Sodium: 140 mmol/L (ref 135–145)

## 2020-06-26 MED ORDER — FUROSEMIDE 10 MG/ML IJ SOLN
8.0000 mg/h | INTRAVENOUS | Status: DC
  Administered 2020-06-26 – 2020-07-02 (×5): 8 mg/h via INTRAVENOUS
  Filled 2020-06-26 (×6): qty 25

## 2020-06-26 MED ORDER — ALBUMIN HUMAN 25 % IV SOLN
25.0000 g | Freq: Two times a day (BID) | INTRAVENOUS | Status: DC
  Administered 2020-06-26: 25 g via INTRAVENOUS
  Filled 2020-06-26: qty 100

## 2020-06-26 NOTE — Progress Notes (Signed)
[facility] PROGRESS NOTE   John [name] VHQ:[id number] DOB: June 29, 1946 DOA: 06/21/2020  PCP: Tracie Harrier, MD  Brief History/Interval Summary: 74 y.o. male with medical history significant for hypertension and coronary artery disease who presented to the emergency room for evaluation of generalized body swelling but mostly in the scrotal area and lower extremity.  Patient states that he has been compliant with his Lasix 80 mg daily without any improvement in the swelling.  He complains of difficulty voiding due to the scrotal swelling. He denies any dietary indiscretion.  Patient was admitted for further management of anasarca.  Reason for Visit: Anasarca  Consultants: None  Procedures: Transthoracic echocardiogram.  See below for report.  Antibiotics: Anti-infectives (From admission, onward)   None      Subjective/Interval History: Patient continues to have swelling in his legs and scrotal area.  He is still asking about when he can go home.  He was explained the disease processes and was also told about his kidney disease.  He denies any chest pain or shortness of breath.  Assessment/Plan:  Anasarca/Acute systolic and diastolic CHF His fluid overload is primarily secondary to congestive heart failure.  Echocardiogram shows systolic and diastolic dysfunction.  EF is around 45%.  Patient seen by cardiology. Patient also has evidence of medical renal disease on the ultrasound.  He does have elevated creatinine.  Apparently he was supposed to be seen by nephrology in the outpatient setting.   Cardiology recommending nephrology consultation. No proteinuria identified on UA here in the hospital.  Lower extremity Doppler studies were negative for DVT.  No history of liver disease. Patient remains on IV diuretics.  However he is not diuresing as much as we would like.  Cardiology would like nephrology to weigh in.  Continue to monitor strict ins and outs and daily  weights. He has had some improvement in his swelling but not to a significant extent.     Acute on chronic kidney disease stage III Creatinine noted to be 1.95 at admission.  It was around 1.3 a few months ago.   Per PCP notes from June he was supposed to be referred to nephrology.  Ultrasound of the abdomen does show medical renal disease. For the most part.  Cardiology is recommending nephrology input since he is not diuresing much.  Will request nephrology to see the patient.   He was noted to be developing some contraction alkalosis.  Cholelithiasis Incidentally noted on ultrasound.  He is asymptomatic.  His transaminases were normal.  Bilirubin was 1.6.  Alkaline phosphatase normal.  Outpatient management.  Scrotal swelling Likely secondary to anasarca.  Scrotal ultrasound reviewed.  Bilateral hydrocele noted.  Scrotal wall thickening was noted.  No other acute abnormalities.  Scrotal swelling has improved slightly but still remains.  Essential hypertension Amlodipine recently discontinued by PCP.  His ARB and HCTZ was also discontinued recently.  He remains on metoprolol.  Blood pressure is reasonably well controlled.    Acute on chronic respiratory failure with hypoxia/history of COPD Noted to be on oxygen at 2 L/min.  Apparently uses oxygen at home only as needed.  Chest x-ray showed pulmonary vascular congestion and some atelectasis.  Unclear if he uses any inhalers at home for his COPD.  Wean down oxygen as tolerated.  History of ascending aortic aneurysm Followed in the outpatient setting for same.  Abdominal aortic aneurysm Noted on ultrasound.  CT scan recommended.  Due to elevated creatinine we'll hold off on it for now.  He does not have any abdominal pain.  This can be pursued in the outpatient setting.  Macrocytosis Hemoglobin is normal.  TSH is normal.  B12 level is 461.  Folate is 15.2.  DVT Prophylaxis: Lovenox Code Status: Full code Family Communication: Discussed  with the patient Disposition Plan: Waiting for some more diuresis before discharging home.  Likely another 2 to 3 days.  Status is: Inpatient  Remains inpatient appropriate because:IV treatments appropriate due to intensity of illness or inability to take PO   Dispo:  Patient From: Home  Planned Disposition: Home  Expected discharge date: 06/28/20  Medically stable for discharge: No     Medications:  Scheduled: . aspirin EC  325 mg Oral Daily  . enoxaparin (LOVENOX) injection  40 mg Subcutaneous Q24H  . furosemide  80 mg Intravenous Q12H  . metoprolol succinate  50 mg Oral Daily  . polyethylene glycol  17 g Oral Daily  . sodium chloride flush  3 mL Intravenous Q12H   Continuous: . sodium chloride     YJE:HUDJSH chloride, acetaminophen, ondansetron (ZOFRAN) IV, sodium chloride flush   Objective:  Vital Signs  Vitals:   06/25/20 1928 06/26/20 0027 06/26/20 0425 06/26/20 0745  BP: 121/80 118/87 (!) 126/97 129/87  Pulse: 82 80 90 83  Resp: 18 20 18 18   Temp: 98.7 F (37.1 C) 98 F (36.7 C) 98.7 F (37.1 C) 98.5 F (36.9 C)  TempSrc: Oral Oral Oral   SpO2: 94% 94% 90% 91%  Weight:      Height:        Intake/Output Summary (Last 24 hours) at 06/26/2020 1037 Last data filed at 06/26/2020 0900 Gross per 24 hour  Intake --  Output 1400 ml  Net -1400 ml   Filed Weights   06/23/20 0456 06/24/20 0421 06/25/20 0533  Weight: 112.7 kg 108.5 kg 107.8 kg    General appearance: Awake alert.  In no distress Resp: Clear to auscultation bilaterally.  Normal effort Cardio: S1-S2 is normal regular.  No S3-S4.  No rubs murmurs or bruit GI: Abdomen is soft.  Nontender nondistended.  Bowel sounds are present normal.  No masses organomegaly Extremities: Continues to have 2-3+ pitting edema bilateral lower extremities.  Extending to the scrotal area.  Not much improvement in the last 24 hours.   Neurologic: Alert and oriented x3.  No focal neurological deficits.    Lab  Results:  Data Reviewed: I have personally reviewed following labs and imaging studies  CBC: Recent Labs  Lab 06/21/20 0925 06/23/20 0734  WBC 9.0 8.1  NEUTROABS 6.8  --   HGB 16.5 16.9  HCT 52.8* 56.0*  MCV 103.3* 106.1*  PLT 142* 132*    Basic Metabolic Panel: Recent Labs  Lab 06/21/20 0925 06/21/20 0925 06/22/20 0417 06/23/20 0734 06/24/20 0458 06/25/20 0433 06/26/20 0418  NA 142   < > 143 144 143 142 140  K 4.2   < > 4.5 4.4 4.1 4.1 3.9  CL 97*   < > 97* 94* 90* 91* 90*  CO2 34*   < > 33* 37* 38* 43* 38*  GLUCOSE 119*   < > 114* 105* 104* 105* 86  BUN 34*   < > 29* 23 20 19 19   CREATININE 1.95*   < > 1.69* 1.49* 1.44* 1.49* 1.46*  CALCIUM 9.0   < > 8.9 9.0 8.9 8.8* 9.0  MG 2.1  --   --   --   --   --   --    < > =  values in this interval not displayed.    GFR: Estimated Creatinine Clearance: 54.6 mL/min (A) (by C-G formula based on SCr of 1.46 mg/dL (H)).  Liver Function Tests: Recent Labs  Lab 06/21/20 0925  AST 30  ALT 39  ALKPHOS 61  BILITOT 1.6*  PROT 5.9*  ALBUMIN 3.4*     Recent Results (from the past 240 hour(s))  SARS Coronavirus 2 by RT PCR (hospital order, performed in [facility] hospital lab) Nasopharyngeal Nasopharyngeal Swab     Status: None   Collection Time: 06/21/20 10:24 AM   Specimen: Nasopharyngeal Swab  Result Value Ref Range Status   SARS Coronavirus 2 NEGATIVE NEGATIVE Final    Comment: (NOTE) SARS-CoV-2 target nucleic acids are NOT DETECTED.  The SARS-CoV-2 RNA is generally detectable in upper and lower respiratory specimens during the acute phase of infection. The lowest concentration of SARS-CoV-2 viral copies this assay can detect is 250 copies / mL. A negative result does not preclude SARS-CoV-2 infection and should not be used as the sole basis for treatment or other patient management decisions.  A negative result may occur with improper specimen collection / handling, submission of specimen other than  nasopharyngeal swab, presence of viral mutation(s) within the areas targeted by this assay, and inadequate number of viral copies (<250 copies / mL). A negative result must be combined with clinical observations, patient history, and epidemiological information.  Fact Sheet for Patients:   StrictlyIdeas.no  Fact Sheet for Healthcare Providers: BankingDealers.co.za  This test is not yet approved or  cleared by the [place] FDA and has been authorized for detection and/or diagnosis of SARS-CoV-2 by FDA under an Emergency Use Authorization (EUA).  This EUA will remain in effect (meaning this test can be used) for the duration of the COVID-19 declaration under Section 564(b)(1) of the Act, 21 U.S.C. section 360bbb-3(b)(1), unless the authorization is terminated or revoked sooner.  Performed at [facility], [address] [place], [place] [address]       Radiology Studies: No results found.     LOS: 4 days   [employer] on [email or web address]  06/26/2020, 10:37 AM

## 2020-06-26 NOTE — Progress Notes (Signed)
[facility] Encounter Note  Patient: John [name] / Admit Date: 06/21/2020 / Date of Encounter: 06/26/2020, 6:51 AM   Subjective: Patient has slight improvements in weight shortness of breath and lower extremity edema but process is very slow at this time..  Patient does have slight improvement in pulmonary Rales but overall improving slowly. No current evidence of myocardial infarction with a troponin of 157 consistent with demand ischemia.  Echocardiogram has shown no significant evidence of worsening LV systolic dysfunction with low normal to slightly hypokinetic global dysfunction and ejection fraction of 45%. This is unchanged from before. It appears that the patient has multiple reasons for heart failure including dietary indiscretion chronic kidney disease and LV systolic dysfunction for which have been a chronic problem. No significant concerns of symptoms today Review of Systems: Positive for: Shortness of breath edema Negative for: Vision change, hearing change, syncope, dizziness, nausea, vomiting,diarrhea, bloody stool, stomach pain, cough, congestion, diaphoresis, urinary frequency, urinary pain,skin lesions, skin rashes Others previously listed  Objective: Telemetry: Normal sinus rhythm Physical Exam: Blood pressure (!) 126/97, pulse 90, temperature 98.7 F (37.1 C), temperature source Oral, resp. rate 18, height 5\' 10"  (1.778 m), weight 107.8 kg, SpO2 90 %. Body mass index is 34.11 kg/m. General: Well developed, well nourished, in no acute distress. Head: Normocephalic, atraumatic, sclera non-icteric, no xanthomas, nares are without discharge. Neck: No apparent masses Lungs: Normal respirations with n some o wheezes, no rhonchi, no rales , few crackles   Heart: Regular rate and rhythm, normal S1 S2, no murmur, no rub, no gallop, PMI is normal size and placement, carotid upstroke normal without bruit, jugular venous pressure normal Abdomen: Soft,  non-tender, non-distended with normoactive bowel sounds. No hepatosplenomegaly. Abdominal aorta is normal size without bruit Extremities: 2+ edema, no clubbing, no cyanosis, no ulcers,  Peripheral: 2+ radial, 2+ femoral, zero+ dorsal pedal pulses Neuro: Alert and oriented. Moves all extremities spontaneously. Psych:  Responds to questions appropriately with a normal affect.   Intake/Output Summary (Last 24 hours) at 06/26/2020 0651 Last data filed at 06/26/2020 0500 Gross per 24 hour  Intake --  Output 1050 ml  Net -1050 ml    Inpatient Medications:  . aspirin EC  325 mg Oral Daily  . enoxaparin (LOVENOX) injection  40 mg Subcutaneous Q24H  . furosemide  80 mg Intravenous Q12H  . metoprolol succinate  50 mg Oral Daily  . polyethylene glycol  17 g Oral Daily  . sodium chloride flush  3 mL Intravenous Q12H   Infusions:  . sodium chloride      Labs: Recent Labs    06/24/20 0458 06/25/20 0433  NA 143 142  K 4.1 4.1  CL 90* 91*  CO2 38* 43*  GLUCOSE 104* 105*  BUN 20 19  CREATININE 1.44* 1.49*  CALCIUM 8.9 8.8*   No results for input(s): AST, ALT, ALKPHOS, BILITOT, PROT, ALBUMIN in the last 72 hours. Recent Labs    06/23/20 0734  WBC 8.1  HGB 16.9  HCT 56.0*  MCV 106.1*  PLT 132*   No results for input(s): CKTOTAL, CKMB, TROPONINI in the last 72 hours. Invalid input(s): POCBNP No results for input(s): HGBA1C in the last 72 hours.   Weights: Filed Weights   06/23/20 0456 06/24/20 0421 06/25/20 0533  Weight: 112.7 kg 108.5 kg 107.8 kg     Radiology/Studies:  US Abdomen Complete  Result Date: 06/23/2020 CLINICAL DATA:  Hyperbilirubinemia, acute renal failure EXAM: ABDOMEN ULTRASOUND COMPLETE COMPARISON:  None. FINDINGS:  Gallbladder: There is layering sludge within the gallbladder, however, the gallbladder is not distended, there is no gallbladder wall thickening, and no pericholecystic fluid is identified. The sonographic Percell Miller sign is reportedly negative.  Common bile duct: Diameter: 5 mm in proximal diameter. The distal duct is obscured by overlying bowel gas. Liver: No focal lesion identified. Within normal limits in parenchymal echogenicity. Portal vein is patent on color Doppler imaging with normal direction of blood flow towards the liver. IVC: No abnormality visualized. Pancreas: Visualized portion unremarkable. Spleen: Size and appearance within normal limits. Right Kidney: Length: 9.9 cm (see image 28). Renal cortical thickness is preserved. Cortical echogenicity, however, is diffusely increased in keeping with changes of underlying medical renal disease. There is no hydronephrosis. No intrarenal mass or calcification is identified. Left Kidney: Length: 10.8 cm. Renal cortical thickness is preserved. Cortical echogenicity, however, is diffusely increased in keeping with changes of underlying medical renal disease. There is no hydronephrosis. No intrarenal mass or calcification is identified. Abdominal aorta: A 3.2 cm aneurysm is seen of the mid abdominal aorta. The distal aorta is obscured by overlying bowel gas. Other findings: No ascites IMPRESSION: Cholelithiasis without sonographic evidence of acute cholecystitis. No intra or extrahepatic biliary ductal dilation. Diffusely increased renal cortical echogenicity suggesting changes of underlying medical renal disease. Incomplete evaluation of the abdominal aorta demonstrating a minimum 3.2 cm juxtarenal/infrarenal abdominal aortic aneurysm. This would be better assessed with dedicated CT imaging. Electronically Signed   By: Fidela Salisbury MD   On: 06/23/2020 08:38   US Venous Img Lower Bilateral (DVT)  Result Date: 06/14/2020 CLINICAL DATA:  Bilateral lower extremity swelling EXAM: BILATERAL LOWER EXTREMITY VENOUS DOPPLER ULTRASOUND TECHNIQUE: Gray-scale sonography with graded compression, as well as color Doppler and duplex ultrasound were performed to evaluate the lower extremity deep venous systems  from the level of the common femoral vein and including the common femoral, femoral, profunda femoral, popliteal and calf veins including the posterior tibial, peroneal and gastrocnemius veins when visible. The superficial great saphenous vein was also interrogated. Spectral Doppler was utilized to evaluate flow at rest and with distal augmentation maneuvers in the common femoral, femoral and popliteal veins. COMPARISON:  None. FINDINGS: RIGHT LOWER EXTREMITY Common Femoral Vein: No evidence of thrombus. Normal compressibility, respiratory phasicity and response to augmentation. Saphenofemoral Junction: No evidence of thrombus. Normal compressibility and flow on color Doppler imaging. Profunda Femoral Vein: No evidence of thrombus. Normal compressibility and flow on color Doppler imaging. Femoral Vein: No evidence of thrombus. Normal compressibility, respiratory phasicity and response to augmentation. Popliteal Vein: No evidence of thrombus. Normal compressibility, respiratory phasicity and response to augmentation. Calf Veins: Right calf veins are poorly visualized secondary to edema. No gross thrombus evident. LEFT LOWER EXTREMITY Common Femoral Vein: No evidence of thrombus. Normal compressibility, respiratory phasicity and response to augmentation. Saphenofemoral Junction: No evidence of thrombus. Normal compressibility and flow on color Doppler imaging. Profunda Femoral Vein: No evidence of thrombus. Normal compressibility and flow on color Doppler imaging. Femoral Vein: No evidence of thrombus. Normal compressibility, respiratory phasicity and response to augmentation. Popliteal Vein: No evidence of thrombus. Normal compressibility, respiratory phasicity and response to augmentation. Calf Veins: No evidence of thrombus. Normal compressibility and flow on color Doppler imaging. IMPRESSION: No significant DVT in either extremity. Limited assessment of the right calf veins. Electronically Signed   By: Jerilynn Mages.  Shick  M.D.   On: 06/14/2020 12:52   DG Chest Portable 1 View  Result Date: 06/21/2020 CLINICAL DATA:  Body swelling  for awhile, scrotal swelling for 2 days, hypertension EXAM: PORTABLE CHEST 1 VIEW COMPARISON:  Portable exam 0944 hours compared to 02/16/2020 FINDINGS: Enlargement of cardiac silhouette with pulmonary vascular congestion. Mediastinal contours normal with mild atherosclerotic calcification aorta. RIGHT basilar atelectasis. Remaining lungs clear. No pleural effusion or pneumothorax. IMPRESSION: Enlargement of cardiac silhouette with pulmonary vascular congestion. RIGHT basilar atelectasis. Electronically Signed   By: Lavonia Dana M.D.   On: 06/21/2020 10:00   ECHOCARDIOGRAM COMPLETE  Result Date: 06/22/2020    ECHOCARDIOGRAM REPORT   Patient Name:   Serigne Peerson Date of Exam: 06/22/2020 Medical Rec #:  [id number]      Height:       70.0 in Accession #:    [id number]     Weight:       245.2 lb Date of Birth:  06-26-1946     BSA:          2.276 m Patient Age:    9 years       BP:           140/104 mmHg Patient Gender: M              HR:           82 bpm. Exam Location:  [facility] Procedure: 2D Echo, Cardiac Doppler and Color Doppler Indications:     CHF- acute diastolic 716.96  History:         Patient has no prior history of Echocardiogram examinations.                  Risk Factors:Hypertension.  Sonographer:     Sherrie Sport RDCS (AE) Referring Phys:  Raymond Diagnosing Phys: Serafina Royals MD IMPRESSIONS  1. Left ventricular ejection fraction, by estimation, is 45 to 50%. The left ventricle has mildly decreased function. The left ventricle has no regional wall motion abnormalities. Left ventricular diastolic parameters were normal.  2. Right ventricular systolic function is normal. The right ventricular size is normal. There is normal pulmonary artery systolic pressure.  3. The mitral valve is normal in structure. Trivial mitral valve regurgitation.  4. The aortic valve is normal in structure.  Aortic valve regurgitation is not visualized. FINDINGS  Left Ventricle: Left ventricular ejection fraction, by estimation, is 45 to 50%. The left ventricle has mildly decreased function. The left ventricle has no regional wall motion abnormalities. The left ventricular internal cavity size was normal in size. There is no left ventricular hypertrophy. Left ventricular diastolic parameters were normal. Right Ventricle: The right ventricular size is normal. No increase in right ventricular wall thickness. Right ventricular systolic function is normal. There is normal pulmonary artery systolic pressure. The tricuspid regurgitant velocity is 2.13 m/s, and  with an assumed right atrial pressure of 10 mmHg, the estimated right ventricular systolic pressure is 78.9 mmHg. Left Atrium: Left atrial size was normal in size. Right Atrium: Right atrial size was normal in size. Pericardium: There is no evidence of pericardial effusion. Mitral Valve: The mitral valve is normal in structure. Trivial mitral valve regurgitation. Tricuspid Valve: The tricuspid valve is normal in structure. Tricuspid valve regurgitation is trivial. Aortic Valve: The aortic valve is normal in structure. Aortic valve regurgitation is not visualized. Aortic valve mean gradient measures 3.0 mmHg. Aortic valve peak gradient measures 5.1 mmHg. Aortic valve area, by VTI measures 3.18 cm. Pulmonic Valve: The pulmonic valve was normal in structure. Pulmonic valve regurgitation is trivial. Aorta: The aortic root and ascending aorta are structurally normal,  with no evidence of dilitation. IAS/Shunts: No atrial level shunt detected by color flow Doppler.  LEFT VENTRICLE PLAX 2D LVIDd:         4.96 cm  Diastology LVIDs:         3.98 cm  LV e' lateral:   5.87 cm/s LV PW:         1.28 cm  LV E/e' lateral: 9.3 LV IVS:        0.98 cm  LV e' medial:    6.20 cm/s LVOT diam:     2.10 cm  LV E/e' medial:  8.8 LV SV:         47 LV SV Index:   21 LVOT Area:     3.46 cm   RIGHT VENTRICLE RV Basal diam:  5.45 cm RV S prime:     14.70 cm/s TAPSE (M-mode): 3.8 cm LEFT ATRIUM              Index       RIGHT ATRIUM           Index LA diam:        3.30 cm  1.45 cm/m  RA Area:     25.00 cm LA Vol (A2C):   113.0 ml 49.65 ml/m RA Volume:   91.60 ml  40.24 ml/m LA Vol (A4C):   45.0 ml  19.77 ml/m LA Biplane Vol: 74.8 ml  32.86 ml/m  AORTIC VALVE                   PULMONIC VALVE AV Area (Vmax):    2.59 cm    PV Vmax:        0.69 m/s AV Area (Vmean):   2.58 cm    PV Peak grad:   1.9 mmHg AV Area (VTI):     3.18 cm    RVOT Peak grad: 2 mmHg AV Vmax:           112.50 cm/s AV Vmean:          80.800 cm/s AV VTI:            0.149 m AV Peak Grad:      5.1 mmHg AV Mean Grad:      3.0 mmHg LVOT Vmax:         84.20 cm/s LVOT Vmean:        60.300 cm/s LVOT VTI:          0.137 m LVOT/AV VTI ratio: 0.92  AORTA Ao Root diam: 4.10 cm MITRAL VALVE               TRICUSPID VALVE MV Area (PHT): 2.91 cm    TR Peak grad:   18.1 mmHg MV Decel Time: 261 msec    TR Vmax:        213.00 cm/s MV E velocity: 54.40 cm/s MV A velocity: 77.60 cm/s  SHUNTS MV E/A ratio:  0.70        Systemic VTI:  0.14 m                            Systemic Diam: 2.10 cm Serafina Royals MD Electronically signed by Serafina Royals MD Signature Date/Time: 06/22/2020/4:58:23 PM    Final    US SCROTUM W/DOPPLER  Result Date: 06/21/2020 CLINICAL DATA:  Scrotal swelling for 2 months. EXAM: SCROTAL ULTRASOUND DOPPLER ULTRASOUND OF THE TESTICLES TECHNIQUE: Complete ultrasound examination of the testicles, epididymis, and other scrotal structures  was performed. Color and spectral Doppler ultrasound were also utilized to evaluate blood flow to the testicles. COMPARISON:  None. FINDINGS: Right testicle Measurements: 3.4 x 2.8 x 2.5 cm. No mass or microlithiasis visualized. Left testicle Measurements: 3.3 x 2.7 x 2.6 cm. No mass or microlithiasis visualized. Right epididymis:  Normal in size and appearance. Left epididymis:  Normal in size and  appearance. Hydrocele:  Small bilateral hydroceles are noted. Varicocele:  Small left varicocele may be present. Pulsed Doppler interrogation of both testes demonstrates normal low resistance arterial and venous waveforms bilaterally. Bilateral scrotal wall swelling is noted. IMPRESSION: Bilateral scrotal wall swelling is noted concerning for inflammation or edema. No definite evidence of testicular mass or torsion is noted. Small bilateral hydroceles are noted. Electronically Signed   By: Marijo Conception M.D.   On: 06/21/2020 10:44     Assessment and Recommendation  74 y.o. male with acute on chronic combined systolic dysfunction and diastolic dysfunction congestive heart failure more consistent with dietary indiscretion chronic kidney disease and structural functional cardiovascular abnormalities now slightly improved 1. Continue intravenous Lasix at current dose at 80 mg injection twice per day and would still consider nephrology consultation if necessary if patient continues to have slow diuresis or concerns 2. No further cardiac diagnostics necessary at this time due to no evidence of myocardial infarction and echocardiogram unchanged from before 3.  Continue metoprolol for current evidence of congestive heart failure but currently avoid ACE inhibitor due to issues of chronic kidney disease at this time as well as hypotension 4. Patient will need congestive heart failure clinic consultation for dietary issues and congestive heart failure concerns due to concerns that the patient may have reoccurrence if not having enough further education Signed, Serafina Royals M.D. FACC

## 2020-06-26 NOTE — Consult Note (Signed)
[facility] CONSULT NOTE    Date: 06/26/2020                  Patient Name:  John [name]  MRN: [id number]  DOB: 1946-05-23  Age / Sex: 74 y.o., male         PCP: Tracie Harrier, MD                 Service Requesting Consult:  Hospitalist                 Reason for Consult:  Chronic kidney disease stage IIIb in the setting of known chronic systolic heart failure with poor diuresis.            History of Present Illness: Patient is a 74 y.o. male with a PMHx of hypertension, coronary disease, chronic kidney disease stage IIIb baseline EGFR 54,, chronic systolic heart failure who was admitted to [facility] on 06/21/2020 for evaluation of increasing generalized edema.  Patient reports that over the past several weeks has had increasing generalized edema most prominent in his scrotum as well as his lower extremities.  He states that he has had some chronic left lower extremity edema but this has now affected his right lower extremity as well.  Initial chest x-ray showed pulmonary vascular congestion.  Patient had 2D echocardiogram performed on 06/22/2020 which revealed EF of 45 to 50%.  He has been receiving furosemide 80 mg IV twice daily with diuresis of 1 to 1.2 L over the past couple days.   Medications: Outpatient medications: Medications Prior to Admission  Medication Sig Dispense Refill Last Dose  . aspirin 81 MG chewable tablet Chew 81 mg by mouth daily.   06/21/2020 at Unknown time  . furosemide (LASIX) 80 MG tablet Take 80 mg by mouth daily.   06/21/2020 at Unknown time  . losartan (COZAAR) 100 MG tablet Take 100 mg by mouth daily.   06/21/2020 at Unknown time  . aspirin EC 325 MG tablet Take 1 tablet (325 mg total) by mouth daily. (Patient not taking: Reported on 06/21/2020) 30 tablet 0 Not Taking at Unknown time  . oxyCODONE (OXY IR/ROXICODONE) 5 MG immediate release tablet Take 1-2 tablets (5-10 mg total) by mouth every 4 (four) hours as needed for severe pain (pain  score 7-10). (Patient not taking: Reported on 06/21/2020) 60 tablet 0 Not Taking at Unknown time    Current medications: Current Facility-Administered Medications  Medication Dose Route Frequency Provider Last Rate Last Admin  . 0.9 %  sodium chloride infusion  250 mL Intravenous PRN Agbata, Tochukwu, MD      . acetaminophen (TYLENOL) tablet 650 mg  650 mg Oral Q4H PRN Agbata, Tochukwu, MD      . aspirin EC tablet 325 mg  325 mg Oral Daily Agbata, Tochukwu, MD   325 mg at 06/26/20 0918  . enoxaparin (LOVENOX) injection 40 mg  40 mg Subcutaneous Q24H Agbata, Tochukwu, MD   40 mg at 06/25/20 2129  . furosemide (LASIX) 250 mg in dextrose 5 % 250 mL (1 mg/mL) infusion  8 mg/hr Intravenous Continuous Marta Bouie, MD 8 mL/hr at 06/26/20 1409 8 mg/hr at 06/26/20 1409  . metoprolol succinate (TOPROL-XL) 24 hr tablet 50 mg  50 mg Oral Daily Corey Skains, MD   50 mg at 06/26/20 0918  . ondansetron (ZOFRAN) injection 4 mg  4 mg Intravenous Q6H PRN Agbata, Tochukwu, MD      . polyethylene glycol (MIRALAX / GLYCOLAX)  packet 17 g  17 g Oral Daily Bonnielee Haff, MD   17 g at 06/26/20 0918  . sodium chloride flush (NS) 0.9 % injection 3 mL  3 mL Intravenous Q12H Agbata, Tochukwu, MD   3 mL at 06/26/20 0918  . sodium chloride flush (NS) 0.9 % injection 3 mL  3 mL Intravenous PRN Agbata, Tochukwu, MD          Allergies: No Known Allergies    Past Medical History: Past Medical History:  Diagnosis Date  . Hypertension      Past Surgical History: Past Surgical History:  Procedure Laterality Date  . INGUINAL HERNIA REPAIR Right 02/04/2017   Procedure: HERNIA REPAIR INGUINAL ADULT;  Surgeon: Leonie Green, MD;  Location: [facility] ORS;  Service: General;  Laterality: Right;  . NO PAST SURGERIES    . QUADRICEPS TENDON REPAIR Left 02/15/2020   Procedure: REPAIR QUADRICEP TENDON;  Surgeon: Corky Mull, MD;  Location: [facility] ORS;  Service: Orthopedics;  Laterality: Left;     Family  History: Family History  Problem Relation Age of Onset  . Diabetes Mother      Social History: Social History   Socioeconomic History  . Marital status: Single    Spouse name: Not on file  . Number of children: Not on file  . Years of education: Not on file  . Highest education level: Not on file  Occupational History  . Not on file  Tobacco Use  . Smoking status: Former Smoker    Quit date: 02/15/2015    Years since quitting: 5.3  . Smokeless tobacco: Never Used  Substance and Sexual Activity  . Alcohol use: Yes    Comment: occassional  . Drug use: No  . Sexual activity: Not on file  Other Topics Concern  . Not on file  Social History Narrative  . Not on file   Social Determinants of Health   Financial Resource Strain:   . Difficulty of Paying Living Expenses:   Food Insecurity:   . Worried About Charity fundraiser in the Last Year:   . Arboriculturist in the Last Year:   Transportation Needs: No Transportation Needs  . Lack of Transportation (Medical): No  . Lack of Transportation (Non-Medical): No  Physical Activity:   . Days of Exercise per Week:   . Minutes of Exercise per Session:   Stress:   . Feeling of Stress :   Social Connections:   . Frequency of Communication with Friends and Family:   . Frequency of Social Gatherings with Friends and Family:   . Attends Religious Services:   . Active Member of Clubs or Organizations:   . Attends Archivist Meetings:   Marland Kitchen Marital Status:   Intimate Partner Violence:   . Fear of Current or Ex-Partner:   . Emotionally Abused:   Marland Kitchen Physically Abused:   . Sexually Abused:      Review of Systems: Review of Systems  Constitutional: Positive for malaise/fatigue. Negative for chills and fever.  HENT: Negative for congestion, hearing loss and tinnitus.   Eyes: Negative for blurred vision and double vision.  Respiratory: Positive for shortness of breath. Negative for cough and sputum production.    Cardiovascular: Positive for leg swelling. Negative for chest pain, palpitations and orthopnea.  Gastrointestinal: Negative for diarrhea, nausea and vomiting.  Genitourinary: Negative for dysuria, frequency and urgency.  Musculoskeletal: Negative for myalgias.  Skin: Negative for itching and rash.  Neurological: Negative for  dizziness and focal weakness.  Endo/Heme/Allergies: Negative for polydipsia. Does not bruise/bleed easily.  Psychiatric/Behavioral: Negative for depression. The patient is not nervous/anxious.      Vital Signs: Blood pressure 97/79, pulse 86, temperature 98.2 F (36.8 C), resp. rate 18, height '5\' 10"'$  (1.778 m), weight 107.8 kg, SpO2 98 %.  Weight trends: Filed Weights   06/23/20 0456 06/24/20 0421 06/25/20 0533  Weight: 112.7 kg 108.5 kg 107.8 kg    Physical Exam: General: NAD, sitting up in chair  Head: Normocephalic, atraumatic.  Eyes: Anicteric, EOMI  Nose: Mucous membranes moist, not inflammed, nonerythematous.  Throat: Oropharynx nonerythematous, no exudate appreciated.   Neck: Supple, trachea midline.  Lungs:  Normal respiratory effort. Clear to auscultation BL without crackles or wheezes.  Heart: S1S2 no rubs  Abdomen:  BS normoactive. Soft, Nondistended, non-tender.  No masses or organomegaly.  Extremities: 3+ pretibial edema.  Neurologic: A&O X3, Motor strength is 5/5 in the all 4 extremities  Skin: No visible rashes, scars.    Lab results: Basic Metabolic Panel: Recent Labs  Lab 06/21/20 0925 06/22/20 0417 06/24/20 0458 06/25/20 0433 06/26/20 0418  NA 142   < > 143 142 140  K 4.2   < > 4.1 4.1 3.9  CL 97*   < > 90* 91* 90*  CO2 34*   < > 38* 43* 38*  GLUCOSE 119*   < > 104* 105* 86  BUN 34*   < > '20 19 19  '$ CREATININE 1.95*   < > 1.44* 1.49* 1.46*  CALCIUM 9.0   < > 8.9 8.8* 9.0  MG 2.1  --   --   --   --    < > = values in this interval not displayed.    Liver Function Tests: Recent Labs  Lab 06/21/20 0925  AST 30  ALT 39   ALKPHOS 61  BILITOT 1.6*  PROT 5.9*  ALBUMIN 3.4*   No results for input(s): LIPASE, AMYLASE in the last 168 hours. No results for input(s): AMMONIA in the last 168 hours.  CBC: Recent Labs  Lab 06/21/20 0925 06/23/20 0734  WBC 9.0 8.1  NEUTROABS 6.8  --   HGB 16.5 16.9  HCT 52.8* 56.0*  MCV 103.3* 106.1*  PLT 142* 132*    Cardiac Enzymes: No results for input(s): CKTOTAL, CKMB, CKMBINDEX, TROPONINI in the last 168 hours.  BNP: Invalid input(s): POCBNP  CBG: No results for input(s): GLUCAP in the last 168 hours.  Microbiology: Results for orders placed or performed during the hospital encounter of 06/21/20  SARS Coronavirus 2 by RT PCR (hospital order, performed in [facility] hospital lab) Nasopharyngeal Nasopharyngeal Swab     Status: None   Collection Time: 06/21/20 10:24 AM   Specimen: Nasopharyngeal Swab  Result Value Ref Range Status   SARS Coronavirus 2 NEGATIVE NEGATIVE Final    Comment: (NOTE) SARS-CoV-2 target nucleic acids are NOT DETECTED.  The SARS-CoV-2 RNA is generally detectable in upper and lower respiratory specimens during the acute phase of infection. The lowest concentration of SARS-CoV-2 viral copies this assay can detect is 250 copies / mL. A negative result does not preclude SARS-CoV-2 infection and should not be used as the sole basis for treatment or other patient management decisions.  A negative result may occur with improper specimen collection / handling, submission of specimen other than nasopharyngeal swab, presence of viral mutation(s) within the areas targeted by this assay, and inadequate number of viral copies (<250 copies / mL). A  negative result must be combined with clinical observations, patient history, and epidemiological information.  Fact Sheet for Patients:   StrictlyIdeas.no  Fact Sheet for Healthcare Providers: BankingDealers.co.za  This test is not yet approved or   cleared by the [place] FDA and has been authorized for detection and/or diagnosis of SARS-CoV-2 by FDA under an Emergency Use Authorization (EUA).  This EUA will remain in effect (meaning this test can be used) for the duration of the COVID-19 declaration under Section 564(b)(1) of the Act, 21 U.S.C. section 360bbb-3(b)(1), unless the authorization is terminated or revoked sooner.  Performed at [facility], [address] [place], [place] [address]     Coagulation Studies: No results for input(s): LABPROT, INR in the last 72 hours.  Urinalysis: No results for input(s): COLORURINE, LABSPEC, PHURINE, GLUCOSEU, HGBUR, BILIRUBINUR, KETONESUR, PROTEINUR, UROBILINOGEN, NITRITE, LEUKOCYTESUR in the last 72 hours.  Invalid input(s): APPERANCEUR    Imaging:  No results found.   Assessment & Plan: Pt is a 74 y.o. male  with a PMHx of hypertension, coronary disease, chronic kidney disease stage IIIb baseline EGFR 54,, chronic systolic heart failure who was admitted to [facility] on 06/21/2020 for evaluation of increasing generalized edema.   1.  Chronic kidney disease stage IIIb EGFR 54. 2.  Chronic systolic heart failure ejection fraction 45 to 50%. 3.  Generalized edema.  Plan: Patient presented with increasing generalized edema most prominent in his scrotum plus bilateral lower extremities.  He also has known chronic systolic heart failure ejection fraction 45 to 50%.  He has been receiving furosemide 80 mg IV twice daily without optimal effect.  Therefore we will transition the patient to Lasix drip at 8 mg/h.  Albumin also noted to be slightly low at 3.4.  Therefore we will administer albumin 25 g IV every 12 hours to assist with diuresis.  Further plan based upon effectiveness of diuresis.  Thanks for consultation.

## 2020-06-26 NOTE — Care Management Important Message (Signed)
Important Message  Patient Details  Name: John [name] MRN: [id number] Date of Birth: [birth year]   Medicare Important Message Given:  Yes     [name] 06/26/2020, 12:04 PM

## 2020-06-27 DIAGNOSIS — I10 Essential (primary) hypertension: Secondary | ICD-10-CM | POA: Diagnosis not present

## 2020-06-27 DIAGNOSIS — I5041 Acute combined systolic (congestive) and diastolic (congestive) heart failure: Secondary | ICD-10-CM | POA: Diagnosis not present

## 2020-06-27 LAB — BASIC METABOLIC PANEL
Anion gap: 9 (ref 5–15)
BUN: 19 mg/dL (ref 8–23)
CO2: 41 mmol/L — ABNORMAL HIGH (ref 22–32)
Calcium: 8.9 mg/dL (ref 8.9–10.3)
Chloride: 88 mmol/L — ABNORMAL LOW (ref 98–111)
Creatinine, Ser: 1.54 mg/dL — ABNORMAL HIGH (ref 0.61–1.24)
GFR calc Af Amer: 51 mL/min — ABNORMAL LOW (ref >60)
GFR calc non Af Amer: 44 mL/min — ABNORMAL LOW (ref >60)
Glucose, Bld: 128 mg/dL — ABNORMAL HIGH (ref 70–99)
Potassium: 4 mmol/L (ref 3.5–5.1)
Sodium: 138 mmol/L (ref 135–145)

## 2020-06-27 MED ORDER — ALBUMIN HUMAN 25 % IV SOLN
25.0000 g | Freq: Two times a day (BID) | INTRAVENOUS | Status: DC
  Administered 2020-06-27 – 2020-07-01 (×10): 25 g via INTRAVENOUS
  Filled 2020-06-27 (×11): qty 100

## 2020-06-27 NOTE — Progress Notes (Signed)
[name]  ROUNDING NOTE   Subjective:  Patient appears to have had fair diuresis yesterday. Urine output was 2.1 L over the preceding 24 hours. Tolerating Lasix drip well.   Objective:  Vital signs in last 24 hours:  Temp:  [97.9 F (36.6 C)-98.2 F (36.8 C)] 98 F (36.7 C) (07/13 0746) Pulse Rate:  [63-86] 81 (07/13 0746) Resp:  [18] 18 (07/13 0746) BP: (97-134)/(60-93) 114/83 (07/13 0746) SpO2:  [91 %-100 %] 93 % (07/13 0746) Weight:  [105.4 kg] 105.4 kg (07/13 0557)  Weight change:  Filed Weights   06/24/20 0421 06/25/20 0533 06/27/20 0557  Weight: 108.5 kg 107.8 kg 105.4 kg    Intake/Output: I/O last 3 completed shifts: In: 311.1 [P.O.:240; I.V.:0.2; IV Piggyback:70.9] Out: 2650 [Urine:2650]   Intake/Output this shift:  Total I/O In: 240 [P.O.:240] Out: 325 [Urine:325]  Physical Exam: General: No acute distress  Head: Normocephalic, atraumatic. Moist oral mucosal membranes  Eyes: Anicteric  Neck: Supple, trachea midline  Lungs:  Clear to auscultation, normal effort  Heart: S1S2 no rubs  Abdomen:  Soft, nontender, bowel sounds present  Extremities: 3+ peripheral edema.  Neurologic: Awake, alert, following commands  Skin: No acute rash       Basic Metabolic Panel: Recent Labs  Lab 06/21/20 0925 06/21/20 0925 06/22/20 0417 06/22/20 0417 06/23/20 0734 06/23/20 0734 06/24/20 0458 06/25/20 0433 06/26/20 0418  NA 142   < > 143  --  144  --  143 142 140  K 4.2   < > 4.5  --  4.4  --  4.1 4.1 3.9  CL 97*   < > 97*  --  94*  --  90* 91* 90*  CO2 34*   < > 33*  --  37*  --  38* 43* 38*  GLUCOSE 119*   < > 114*  --  105*  --  104* 105* 86  BUN 34*   < > 29*  --  23  --  _0 CREATININE 1.95*   < > 1.69*  --  1.49*  --  1.44* 1.49* 1.46*  CALCIUM 9.0   < > 8.9   < > 9.0   < > 8.9 8.8* 9.0  MG 2.1  --   --   --   --   --   --   --   --    < > = values in this interval not displayed.    Liver Function Tests: Recent Labs  Lab  06/21/20 0925  AST 30  ALT 39  ALKPHOS 61  BILITOT 1.6*  PROT 5.9*  ALBUMIN 3.4*   No results for input(s): LIPASE, AMYLASE in the last 168 hours. No results for input(s): AMMONIA in the last 168 hours.  CBC: Recent Labs  Lab 06/21/20 0925 06/23/20 0734  WBC 9.0 8.1  NEUTROABS 6.8  --   HGB 16.5 16.9  HCT 52.8* 56.0*  MCV 103.3* 106.1*  PLT 142* 132*    Cardiac Enzymes: No results for input(s): CKTOTAL, CKMB, CKMBINDEX, TROPONINI in the last 168 hours.  BNP: Invalid input(s): POCBNP  CBG: No results for input(s): GLUCAP in the last 168 hours.  Microbiology: Results for orders placed or performed during the hospital encounter of 06/21/20  SARS Coronavirus 2 by RT PCR (hospital order, performed in [facility] hospital lab) Nasopharyngeal Nasopharyngeal Swab     Status: None   Collection Time: 06/21/20 10:24 AM   Specimen: Nasopharyngeal Swab  Result Value  Ref Range Status   SARS Coronavirus 2 NEGATIVE NEGATIVE Final    Comment: (NOTE) SARS-CoV-2 target nucleic acids are NOT DETECTED.  The SARS-CoV-2 RNA is generally detectable in upper and lower respiratory specimens during the acute phase of infection. The lowest concentration of SARS-CoV-2 viral copies this assay can detect is 250 copies / mL. A negative result does not preclude SARS-CoV-2 infection and should not be used as the sole basis for treatment or other patient management decisions.  A negative result may occur with improper specimen collection / handling, submission of specimen other than nasopharyngeal swab, presence of viral mutation(s) within the areas targeted by this assay, and inadequate number of viral copies (<250 copies / mL). A negative result must be combined with clinical observations, patient history, and epidemiological information.  Fact Sheet for Patients:   StrictlyIdeas.no  Fact Sheet for Healthcare  Providers: BankingDealers.co.za  This test is not yet approved or  cleared by the [place] FDA and has been authorized for detection and/or diagnosis of SARS-CoV-2 by FDA under an Emergency Use Authorization (EUA).  This EUA will remain in effect (meaning this test can be used) for the duration of the COVID-19 declaration under Section 564(b)(1) of the Act, 21 U.S.C. section 360bbb-3(b)(1), unless the authorization is terminated or revoked sooner.  Performed at [facility], [address] [place], [place] [address]     Coagulation Studies: No results for input(s): LABPROT, INR in the last 72 hours.  Urinalysis: No results for input(s): COLORURINE, LABSPEC, PHURINE, GLUCOSEU, HGBUR, BILIRUBINUR, KETONESUR, PROTEINUR, UROBILINOGEN, NITRITE, LEUKOCYTESUR in the last 72 hours.  Invalid input(s): APPERANCEUR    Imaging: No results found.   Medications:   . sodium chloride    . albumin human    . furosemide (LASIX) infusion Stopped (06/26/20 1417)   . aspirin EC  325 mg Oral Daily  . enoxaparin (LOVENOX) injection  40 mg Subcutaneous Q24H  . metoprolol succinate  50 mg Oral Daily  . polyethylene glycol  17 g Oral Daily  . sodium chloride flush  3 mL Intravenous Q12H   sodium chloride, acetaminophen, ondansetron (ZOFRAN) IV, sodium chloride flush  Assessment/ Plan:  74 y.o. male with a PMHx of hypertension, coronary disease, chronic kidney disease stage IIIb baseline EGFR 54,, chronic systolic heart failure who was admitted to [facility] on 06/21/2020 for evaluation of increasing generalized edema.   1.  Chronic kidney disease stage IIIb EGFR 54. 2.  Chronic systolic heart failure ejection fraction 45 to 50%. 3.  Generalized edema.  Plan: It appears that the patient has responded quite well to furosemide drip as well as albumin combination.  Continue albumin and lasix gtt combination.  UOP was 2.1 liters over the preceding 24 hours.   Encouraged pt to limit intake to no more than 1 liter per day, he verbalized understanding of this. Will follow.     LOS: 5 Rondarius Kadrmas 7/13/202111:18 AM

## 2020-06-27 NOTE — Progress Notes (Signed)
[facility] Encounter Note  Patient: Jrue Jarriel / Admit Date: 06/21/2020 / Date of Encounter: 06/27/2020, 1:39 PM   Subjective: Patient has slight improvements in weight shortness of breath and lower extremity edema but process is very slow at this time but much better after lasix infusion..  Patient does have slight improvement in pulmonary Rales but overall improving slowly. No current evidence of myocardial infarction with a troponin of 157 consistent with demand ischemia.  Echocardiogram has shown no significant evidence of worsening LV systolic dysfunction with low normal to slightly hypokinetic global dysfunction and ejection fraction of 45%. This is unchanged from before. It appears that the patient has multiple reasons for heart failure including dietary indiscretion chronic kidney disease and LV systolic dysfunction for which have been a chronic problem. No significant concerns of symptoms today Review of Systems: Positive for: Shortness of breath edema Negative for: Vision change, hearing change, syncope, dizziness, nausea, vomiting,diarrhea, bloody stool, stomach pain, cough, congestion, diaphoresis, urinary frequency, urinary pain,skin lesions, skin rashes Others previously listed  Objective: Telemetry: Normal sinus rhythm Physical Exam: Blood pressure (!) 126/93, pulse 80, temperature 98.5 F (36.9 C), temperature source Oral, resp. rate 18, height 5\' 10"  (1.778 m), weight 105.4 kg, SpO2 91 %. Body mass index is 33.35 kg/m. General: Well developed, well nourished, in no acute distress. Head: Normocephalic, atraumatic, sclera non-icteric, no xanthomas, nares are without discharge. Neck: No apparent masses Lungs: Normal respirations with n some o wheezes, no rhonchi, no rales , few crackles   Heart: Regular rate and rhythm, normal S1 S2, no murmur, no rub, no gallop, PMI is normal size and placement, carotid upstroke normal without bruit, jugular venous  pressure normal Abdomen: Soft, non-tender, non-distended with normoactive bowel sounds. No hepatosplenomegaly. Abdominal aorta is normal size without bruit Extremities: 2+ edema, no clubbing, no cyanosis, no ulcers,  Peripheral: 2+ radial, 2+ femoral, zero+ dorsal pedal pulses Neuro: Alert and oriented. Moves all extremities spontaneously. Psych:  Responds to questions appropriately with a normal affect.   Intake/Output Summary (Last 24 hours) at 06/27/2020 1339 Last data filed at 06/27/2020 1149 Gross per 24 hour  Intake 551.09 ml  Output 2650 ml  Net -2098.91 ml    Inpatient Medications:   aspirin EC  325 mg Oral Daily   enoxaparin (LOVENOX) injection  40 mg Subcutaneous Q24H   metoprolol succinate  50 mg Oral Daily   polyethylene glycol  17 g Oral Daily   sodium chloride flush  3 mL Intravenous Q12H   Infusions:   sodium chloride     albumin human 25 g (06/27/20 1317)   furosemide (LASIX) infusion Stopped (06/26/20 1417)    Labs: Recent Labs    06/26/20 0418 06/27/20 1239  NA 140 138  K 3.9 4.0  CL 90* 88*  CO2 38* 41*  GLUCOSE 86 128*  BUN 19 19  CREATININE 1.46* 1.54*  CALCIUM 9.0 8.9   No results for input(s): AST, ALT, ALKPHOS, BILITOT, PROT, ALBUMIN in the last 72 hours. No results for input(s): WBC, NEUTROABS, HGB, HCT, MCV, PLT in the last 72 hours. No results for input(s): CKTOTAL, CKMB, TROPONINI in the last 72 hours. Invalid input(s): POCBNP No results for input(s): HGBA1C in the last 72 hours.   Weights: Filed Weights   06/24/20 0421 06/25/20 0533 06/27/20 0557  Weight: 108.5 kg 107.8 kg 105.4 kg     Radiology/Studies:  US Abdomen Complete  Result Date: 06/23/2020 CLINICAL DATA:  Hyperbilirubinemia, acute renal failure EXAM: ABDOMEN  ULTRASOUND COMPLETE COMPARISON:  None. FINDINGS: Gallbladder: There is layering sludge within the gallbladder, however, the gallbladder is not distended, there is no gallbladder wall thickening, and no  pericholecystic fluid is identified. The sonographic Percell Miller sign is reportedly negative. Common bile duct: Diameter: 5 mm in proximal diameter. The distal duct is obscured by overlying bowel gas. Liver: No focal lesion identified. Within normal limits in parenchymal echogenicity. Portal vein is patent on color Doppler imaging with normal direction of blood flow towards the liver. IVC: No abnormality visualized. Pancreas: Visualized portion unremarkable. Spleen: Size and appearance within normal limits. Right Kidney: Length: 9.9 cm (see image 28). Renal cortical thickness is preserved. Cortical echogenicity, however, is diffusely increased in keeping with changes of underlying medical renal disease. There is no hydronephrosis. No intrarenal mass or calcification is identified. Left Kidney: Length: 10.8 cm. Renal cortical thickness is preserved. Cortical echogenicity, however, is diffusely increased in keeping with changes of underlying medical renal disease. There is no hydronephrosis. No intrarenal mass or calcification is identified. Abdominal aorta: A 3.2 cm aneurysm is seen of the mid abdominal aorta. The distal aorta is obscured by overlying bowel gas. Other findings: No ascites IMPRESSION: Cholelithiasis without sonographic evidence of acute cholecystitis. No intra or extrahepatic biliary ductal dilation. Diffusely increased renal cortical echogenicity suggesting changes of underlying medical renal disease. Incomplete evaluation of the abdominal aorta demonstrating a minimum 3.2 cm juxtarenal/infrarenal abdominal aortic aneurysm. This would be better assessed with dedicated CT imaging. Electronically Signed   By: Fidela Salisbury MD   On: 06/23/2020 08:38   US Venous Img Lower Bilateral (DVT)  Result Date: 06/14/2020 CLINICAL DATA:  Bilateral lower extremity swelling EXAM: BILATERAL LOWER EXTREMITY VENOUS DOPPLER ULTRASOUND TECHNIQUE: Gray-scale sonography with graded compression, as well as color Doppler and  duplex ultrasound were performed to evaluate the lower extremity deep venous systems from the level of the common femoral vein and including the common femoral, femoral, profunda femoral, popliteal and calf veins including the posterior tibial, peroneal and gastrocnemius veins when visible. The superficial great saphenous vein was also interrogated. Spectral Doppler was utilized to evaluate flow at rest and with distal augmentation maneuvers in the common femoral, femoral and popliteal veins. COMPARISON:  None. FINDINGS: RIGHT LOWER EXTREMITY Common Femoral Vein: No evidence of thrombus. Normal compressibility, respiratory phasicity and response to augmentation. Saphenofemoral Junction: No evidence of thrombus. Normal compressibility and flow on color Doppler imaging. Profunda Femoral Vein: No evidence of thrombus. Normal compressibility and flow on color Doppler imaging. Femoral Vein: No evidence of thrombus. Normal compressibility, respiratory phasicity and response to augmentation. Popliteal Vein: No evidence of thrombus. Normal compressibility, respiratory phasicity and response to augmentation. Calf Veins: Right calf veins are poorly visualized secondary to edema. No gross thrombus evident. LEFT LOWER EXTREMITY Common Femoral Vein: No evidence of thrombus. Normal compressibility, respiratory phasicity and response to augmentation. Saphenofemoral Junction: No evidence of thrombus. Normal compressibility and flow on color Doppler imaging. Profunda Femoral Vein: No evidence of thrombus. Normal compressibility and flow on color Doppler imaging. Femoral Vein: No evidence of thrombus. Normal compressibility, respiratory phasicity and response to augmentation. Popliteal Vein: No evidence of thrombus. Normal compressibility, respiratory phasicity and response to augmentation. Calf Veins: No evidence of thrombus. Normal compressibility and flow on color Doppler imaging. IMPRESSION: No significant DVT in either  extremity. Limited assessment of the right calf veins. Electronically Signed   By: Jerilynn Mages.  Shick M.D.   On: 06/14/2020 12:52   DG Chest Portable 1 View  Result Date:  06/21/2020 CLINICAL DATA:  Body swelling for awhile, scrotal swelling for 2 days, hypertension EXAM: PORTABLE CHEST 1 VIEW COMPARISON:  Portable exam 0086 hours compared to 02/16/2020 FINDINGS: Enlargement of cardiac silhouette with pulmonary vascular congestion. Mediastinal contours normal with mild atherosclerotic calcification aorta. RIGHT basilar atelectasis. Remaining lungs clear. No pleural effusion or pneumothorax. IMPRESSION: Enlargement of cardiac silhouette with pulmonary vascular congestion. RIGHT basilar atelectasis. Electronically Signed   By: Lavonia Dana M.D.   On: 06/21/2020 10:00   ECHOCARDIOGRAM COMPLETE  Result Date: 06/22/2020    ECHOCARDIOGRAM REPORT   Patient Name:   Micaiah Bouknight Date of Exam: 06/22/2020 Medical Rec #:  [id number]      Height:       70.0 in Accession #:    [id number]     Weight:       245.2 lb Date of Birth:  1946/04/14     BSA:          2.276 m Patient Age:    64 years       BP:           140/104 mmHg Patient Gender: M              HR:           82 bpm. Exam Location:  [facility] Procedure: 2D Echo, Cardiac Doppler and Color Doppler Indications:     CHF- acute diastolic 833.82  History:         Patient has no prior history of Echocardiogram examinations.                  Risk Factors:Hypertension.  Sonographer:     Sherrie Sport RDCS (AE) Referring Phys:  [place] Diagnosing Phys: Serafina Royals MD IMPRESSIONS  1. Left ventricular ejection fraction, by estimation, is 45 to 50%. The left ventricle has mildly decreased function. The left ventricle has no regional wall motion abnormalities. Left ventricular diastolic parameters were normal.  2. Right ventricular systolic function is normal. The right ventricular size is normal. There is normal pulmonary artery systolic pressure.  3. The mitral valve is normal  in structure. Trivial mitral valve regurgitation.  4. The aortic valve is normal in structure. Aortic valve regurgitation is not visualized. FINDINGS  Left Ventricle: Left ventricular ejection fraction, by estimation, is 45 to 50%. The left ventricle has mildly decreased function. The left ventricle has no regional wall motion abnormalities. The left ventricular internal cavity size was normal in size. There is no left ventricular hypertrophy. Left ventricular diastolic parameters were normal. Right Ventricle: The right ventricular size is normal. No increase in right ventricular wall thickness. Right ventricular systolic function is normal. There is normal pulmonary artery systolic pressure. The tricuspid regurgitant velocity is 2.13 m/s, and  with an assumed right atrial pressure of 10 mmHg, the estimated right ventricular systolic pressure is 50.5 mmHg. Left Atrium: Left atrial size was normal in size. Right Atrium: Right atrial size was normal in size. Pericardium: There is no evidence of pericardial effusion. Mitral Valve: The mitral valve is normal in structure. Trivial mitral valve regurgitation. Tricuspid Valve: The tricuspid valve is normal in structure. Tricuspid valve regurgitation is trivial. Aortic Valve: The aortic valve is normal in structure. Aortic valve regurgitation is not visualized. Aortic valve mean gradient measures 3.0 mmHg. Aortic valve peak gradient measures 5.1 mmHg. Aortic valve area, by VTI measures 3.18 cm. Pulmonic Valve: The pulmonic valve was normal in structure. Pulmonic valve regurgitation is trivial. Aorta: The aortic root  and ascending aorta are structurally normal, with no evidence of dilitation. IAS/Shunts: No atrial level shunt detected by color flow Doppler.  LEFT VENTRICLE PLAX 2D LVIDd:         4.96 cm  Diastology LVIDs:         3.98 cm  LV e' lateral:   5.87 cm/s LV PW:         1.28 cm  LV E/e' lateral: 9.3 LV IVS:        0.98 cm  LV e' medial:    6.20 cm/s LVOT diam:      2.10 cm  LV E/e' medial:  8.8 LV SV:         47 LV SV Index:   21 LVOT Area:     3.46 cm  RIGHT VENTRICLE RV Basal diam:  5.45 cm RV S prime:     14.70 cm/s TAPSE (M-mode): 3.8 cm LEFT ATRIUM              Index       RIGHT ATRIUM           Index LA diam:        3.30 cm  1.45 cm/m  RA Area:     25.00 cm LA Vol (A2C):   113.0 ml 49.65 ml/m RA Volume:   91.60 ml  40.24 ml/m LA Vol (A4C):   45.0 ml  19.77 ml/m LA Biplane Vol: 74.8 ml  32.86 ml/m  AORTIC VALVE                   PULMONIC VALVE AV Area (Vmax):    2.59 cm    PV Vmax:        0.69 m/s AV Area (Vmean):   2.58 cm    PV Peak grad:   1.9 mmHg AV Area (VTI):     3.18 cm    RVOT Peak grad: 2 mmHg AV Vmax:           112.50 cm/s AV Vmean:          80.800 cm/s AV VTI:            0.149 m AV Peak Grad:      5.1 mmHg AV Mean Grad:      3.0 mmHg LVOT Vmax:         84.20 cm/s LVOT Vmean:        60.300 cm/s LVOT VTI:          0.137 m LVOT/AV VTI ratio: 0.92  AORTA Ao Root diam: 4.10 cm MITRAL VALVE               TRICUSPID VALVE MV Area (PHT): 2.91 cm    TR Peak grad:   18.1 mmHg MV Decel Time: 261 msec    TR Vmax:        213.00 cm/s MV E velocity: 54.40 cm/s MV A velocity: 77.60 cm/s  SHUNTS MV E/A ratio:  0.70        Systemic VTI:  0.14 m                            Systemic Diam: 2.10 cm Serafina Royals MD Electronically signed by Serafina Royals MD Signature Date/Time: 06/22/2020/4:58:23 PM    Final    US SCROTUM W/DOPPLER  Result Date: 06/21/2020 CLINICAL DATA:  Scrotal swelling for 2 months. EXAM: SCROTAL ULTRASOUND DOPPLER ULTRASOUND OF THE TESTICLES TECHNIQUE: Complete ultrasound examination of the  testicles, epididymis, and other scrotal structures was performed. Color and spectral Doppler ultrasound were also utilized to evaluate blood flow to the testicles. COMPARISON:  None. FINDINGS: Right testicle Measurements: 3.4 x 2.8 x 2.5 cm. No mass or microlithiasis visualized. Left testicle Measurements: 3.3 x 2.7 x 2.6 cm. No mass or microlithiasis visualized.  Right epididymis:  Normal in size and appearance. Left epididymis:  Normal in size and appearance. Hydrocele:  Small bilateral hydroceles are noted. Varicocele:  Small left varicocele may be present. Pulsed Doppler interrogation of both testes demonstrates normal low resistance arterial and venous waveforms bilaterally. Bilateral scrotal wall swelling is noted. IMPRESSION: Bilateral scrotal wall swelling is noted concerning for inflammation or edema. No definite evidence of testicular mass or torsion is noted. Small bilateral hydroceles are noted. Electronically Signed   By: Marijo Conception M.D.   On: 06/21/2020 10:44     Assessment and Recommendation  74 y.o. male with acute on chronic combined systolic dysfunction and diastolic dysfunction congestive heart failure more consistent with dietary indiscretion chronic kidney disease and structural functional cardiovascular abnormalities now slightly improved after change to lasix infusion 1. Continue intravenous Lasix drip  2. No further cardiac diagnostics necessary at this time due to no evidence of myocardial infarction and echocardiogram unchanged from before 3.  Continue metoprolol for current evidence of congestive heart failure but currently avoid ACE inhibitor due to issues of chronic kidney disease at this time as well as hypotension 4. Patient will need congestive heart failure clinic consultation for dietary issues and congestive heart failure concerns due to concerns that the patient may have reoccurrence if not having enough further education 5. ambulation Signed, Serafina Royals M.D. FACC

## 2020-06-27 NOTE — Progress Notes (Signed)
[facility] PROGRESS NOTE   John [name] ZOX:[id number] DOB: 1946/11/04 DOA: 06/21/2020  PCP: Tracie Harrier, MD  Brief History/Interval Summary: 74 y.o. male with medical history significant for hypertension and coronary artery disease who presented to the emergency room for evaluation of generalized body swelling but mostly in the scrotal area and lower extremity.  Patient states that he has been compliant with his Lasix 80 mg daily without any improvement in the swelling.  He complains of difficulty voiding due to the scrotal swelling. He denies any dietary indiscretion.  Patient was admitted for further management of anasarca.  Reason for Visit: Anasarca  Consultants:  Cardiology Nephrology  Procedures: Transthoracic echocardiogram.  See below for report.  Antibiotics: Anti-infectives (From admission, onward)   None      Subjective/Interval History: Patient without any new complaints today.  Continues to have swelling in his legs and scrotal area though slightly better.  No chest pain or shortness of breath.    Assessment/Plan:  Anasarca/Acute systolic and diastolic CHF His fluid overload is primarily secondary to congestive heart failure.  Echocardiogram shows systolic and diastolic dysfunction.  EF is around 45%.  Patient seen by cardiology. Patient also has evidence of medical renal disease on the ultrasound.  He does have elevated creatinine.  Apparently he was supposed to be seen by nephrology in the outpatient setting.   Cardiology recommending nephrology consultation. No proteinuria identified on UA here in the hospital.  Lower extremity Doppler studies were negative for DVT.  No history of liver disease. Patient was on twice daily intravenous furosemide.  Patient was not diuresing much.  Nephrology was consulted who placed the patient on furosemide infusion.  He appears to be responding with good diuresis yesterday.  He was also given albumin.   Continue  current management for now.  Follow cardiology and nephrology recommendations.  Wait for improvement in his lower extremity edema.     Acute on chronic kidney disease stage III Creatinine noted to be 1.95 at admission.  It was around 1.3 a few months ago.   Per PCP notes from June he was supposed to be referred to nephrology.  Ultrasound of the abdomen does show medical renal disease. Renal function has been stable.  Appreciate nephrology assistance. Labs are pending this morning.   Cholelithiasis Incidentally noted on ultrasound.  He is asymptomatic.  His transaminases were normal.  Bilirubin was 1.6.  Alkaline phosphatase normal.  Outpatient management.  Scrotal swelling Likely secondary to anasarca.  Scrotal ultrasound reviewed.  Bilateral hydrocele noted.  Scrotal wall thickening was noted.  No other acute abnormalities.    Essential hypertension Amlodipine recently discontinued by PCP.  His ARB and HCTZ was also discontinued recently.  He remains on metoprolol.  Blood pressure is reasonably well controlled.  Acute on chronic respiratory failure with hypoxia/history of COPD Noted to be on oxygen at 2 L/min.  Apparently uses oxygen at home only as needed.  Chest x-ray showed pulmonary vascular congestion and some atelectasis.  Wean down oxygen as tolerated.  History of ascending aortic aneurysm Followed in the outpatient setting for same.  Abdominal aortic aneurysm Noted on ultrasound.  CT scan recommended.  Due to elevated creatinine we'll hold off on it for now.  He does not have any abdominal pain.  This can be pursued in the outpatient setting.  Macrocytosis Hemoglobin is normal.  TSH is normal.  B12 level is 461.  Folate is 15.2.  DVT Prophylaxis: Lovenox Code Status: Full code Family Communication:  Discussed with the patient Disposition Plan: Waiting for some more diuresis before discharging home.  Likely another 2 to 3 days.  Status is: Inpatient  Remains inpatient  appropriate because:IV treatments appropriate due to intensity of illness or inability to take PO   Dispo:  Patient From: Home  Planned Disposition: Home  Expected discharge date: 06/28/20  Medically stable for discharge: No     Medications:  Scheduled: . aspirin EC  325 mg Oral Daily  . enoxaparin (LOVENOX) injection  40 mg Subcutaneous Q24H  . metoprolol succinate  50 mg Oral Daily  . polyethylene glycol  17 g Oral Daily  . sodium chloride flush  3 mL Intravenous Q12H   Continuous: . sodium chloride    . albumin human    . furosemide (LASIX) infusion Stopped (06/26/20 1417)   FYB:OFBPZW chloride, acetaminophen, ondansetron (ZOFRAN) IV, sodium chloride flush   Objective:  Vital Signs  Vitals:   06/27/20 0557 06/27/20 0608 06/27/20 0746 06/27/20 1147  BP:  134/60 114/83 (!) 126/93  Pulse:  63 81 80  Resp:   18 18  Temp:  97.9 F (36.6 C) 98 F (36.7 C) 98.5 F (36.9 C)  TempSrc:  Oral Oral Oral  SpO2:  98% 93% 91%  Weight: 105.4 kg     Height:        Intake/Output Summary (Last 24 hours) at 06/27/2020 1202 Last data filed at 06/27/2020 1149 Gross per 24 hour  Intake 551.09 ml  Output 2650 ml  Net -2098.91 ml   Filed Weights   06/24/20 0421 06/25/20 0533 06/27/20 0557  Weight: 108.5 kg 107.8 kg 105.4 kg    General appearance: Awake alert.  In no distress Resp: Clear to auscultation bilaterally.  Normal effort Cardio: S1-S2 is normal regular.  No S3-S4.  No rubs murmurs or bruit GI: Abdomen is soft.  Nontender nondistended.  Bowel sounds are present normal.  No masses organomegaly Extremities: 2-3+ pitting edema bilateral lower extremities.  Extending all the way up to the scrotal area.   Neurologic: Alert and oriented x3.  No focal neurological deficits.     Lab Results:  Data Reviewed: I have personally reviewed following labs and imaging studies  CBC: Recent Labs  Lab 06/21/20 0925 06/23/20 0734  WBC 9.0 8.1  NEUTROABS 6.8  --   HGB 16.5  16.9  HCT 52.8* 56.0*  MCV 103.3* 106.1*  PLT 142* 132*    Basic Metabolic Panel: Recent Labs  Lab 06/21/20 0925 06/21/20 0925 06/22/20 0417 06/23/20 0734 06/24/20 0458 06/25/20 0433 06/26/20 0418  NA 142   < > 143 144 143 142 140  K 4.2   < > 4.5 4.4 4.1 4.1 3.9  CL 97*   < > 97* 94* 90* 91* 90*  CO2 34*   < > 33* 37* 38* 43* 38*  GLUCOSE 119*   < > 114* 105* 104* 105* 86  BUN 34*   < > 29* 23 20 19 19   CREATININE 1.95*   < > 1.69* 1.49* 1.44* 1.49* 1.46*  CALCIUM 9.0   < > 8.9 9.0 8.9 8.8* 9.0  MG 2.1  --   --   --   --   --   --    < > = values in this interval not displayed.    GFR: Estimated Creatinine Clearance: 54 mL/min (A) (by C-G formula based on SCr of 1.46 mg/dL (H)).  Liver Function Tests: Recent Labs  Lab 06/21/20 0925  AST 30  ALT 39  ALKPHOS 61  BILITOT 1.6*  PROT 5.9*  ALBUMIN 3.4*     Recent Results (from the past 240 hour(s))  SARS Coronavirus 2 by RT PCR (hospital order, performed in [facility] hospital lab) Nasopharyngeal Nasopharyngeal Swab     Status: None   Collection Time: 06/21/20 10:24 AM   Specimen: Nasopharyngeal Swab  Result Value Ref Range Status   SARS Coronavirus 2 NEGATIVE NEGATIVE Final    Comment: (NOTE) SARS-CoV-2 target nucleic acids are NOT DETECTED.  The SARS-CoV-2 RNA is generally detectable in upper and lower respiratory specimens during the acute phase of infection. The lowest concentration of SARS-CoV-2 viral copies this assay can detect is 250 copies / mL. A negative result does not preclude SARS-CoV-2 infection and should not be used as the sole basis for treatment or other patient management decisions.  A negative result may occur with improper specimen collection / handling, submission of specimen other than nasopharyngeal swab, presence of viral mutation(s) within the areas targeted by this assay, and inadequate number of viral copies (<250 copies / mL). A negative result must be combined with  clinical observations, patient history, and epidemiological information.  Fact Sheet for Patients:   StrictlyIdeas.no  Fact Sheet for Healthcare Providers: BankingDealers.co.za  This test is not yet approved or  cleared by the [place] FDA and has been authorized for detection and/or diagnosis of SARS-CoV-2 by FDA under an Emergency Use Authorization (EUA).  This EUA will remain in effect (meaning this test can be used) for the duration of the COVID-19 declaration under Section 564(b)(1) of the Act, 21 U.S.C. section 360bbb-3(b)(1), unless the authorization is terminated or revoked sooner.  Performed at [facility], [address], [place], [place] [address]       Radiology Studies: No results found.     LOS: 5 days   Latoi Giraldo [employer] on [email or web address]  06/27/2020, 12:02 PM

## 2020-06-28 DIAGNOSIS — I5041 Acute combined systolic (congestive) and diastolic (congestive) heart failure: Secondary | ICD-10-CM | POA: Diagnosis not present

## 2020-06-28 DIAGNOSIS — R601 Generalized edema: Secondary | ICD-10-CM | POA: Diagnosis not present

## 2020-06-28 DIAGNOSIS — J9621 Acute and chronic respiratory failure with hypoxia: Secondary | ICD-10-CM

## 2020-06-28 DIAGNOSIS — I10 Essential (primary) hypertension: Secondary | ICD-10-CM | POA: Diagnosis not present

## 2020-06-28 LAB — BASIC METABOLIC PANEL
Anion gap: 11 (ref 5–15)
BUN: 18 mg/dL (ref 8–23)
CO2: 38 mmol/L — ABNORMAL HIGH (ref 22–32)
Calcium: 8.8 mg/dL — ABNORMAL LOW (ref 8.9–10.3)
Chloride: 91 mmol/L — ABNORMAL LOW (ref 98–111)
Creatinine, Ser: 1.43 mg/dL — ABNORMAL HIGH (ref 0.61–1.24)
GFR calc Af Amer: 56 mL/min — ABNORMAL LOW (ref >60)
GFR calc non Af Amer: 48 mL/min — ABNORMAL LOW (ref >60)
Glucose, Bld: 96 mg/dL (ref 70–99)
Potassium: 3.7 mmol/L (ref 3.5–5.1)
Sodium: 140 mmol/L (ref 135–145)

## 2020-06-28 LAB — CBC
HCT: 49.6 % (ref 39.0–52.0)
Hemoglobin: 15.4 g/dL (ref 13.0–17.0)
MCH: 31.8 pg (ref 26.0–34.0)
MCHC: 31 g/dL (ref 30.0–36.0)
MCV: 102.3 fL — ABNORMAL HIGH (ref 80.0–100.0)
Platelets: 133 10*3/uL — ABNORMAL LOW (ref 150–400)
RBC: 4.85 MIL/uL (ref 4.22–5.81)
RDW: 13.6 % (ref 11.5–15.5)
WBC: 6.4 10*3/uL (ref 4.0–10.5)
nRBC: 0 % (ref 0.0–0.2)

## 2020-06-28 NOTE — Progress Notes (Signed)
PROGRESS NOTE    John [name]  KGU:[id number] DOB: 1946-11-26 DOA: 06/21/2020 PCP: Tracie Harrier, MD  Brief Narrative:  74 y.o.malewith medical history significant forhypertension and coronary artery disease who presented to the emergency room for evaluation of generalized body swelling but mostly in the scrotal area and lower extremity. Patient states that he has been compliant with his Lasix 80 mg daily without any improvement in the swelling. He complains of difficulty voiding due to the scrotal swelling. He denies any dietary indiscretion.  Patient was admitted for further management of anasarca.  7/14: Patient seen and examined.  Remains on Lasix infusion.  Diuresing well.  Net -10 L over admission.  Reluctant to agree to second IV placement for concomitant administration of albumin.  I spoke to patient.  He was in agreement to having 2nd IV line placed   Assessment & Plan:   Principal Problem:   Acute CHF (congestive heart failure) (HCC) Active Problems:   Hypertension  Anasarca Acute on chronic systolic and diastolic congestive heart failure Initially did not respond to Lasix pushes Lasix GTT initiated with good result Net -10 L over admission Case discussed with nephrology Plan: Continue Lasix GTT Continue albumin bolus Monitor urine output Daily weights Fluid restrict, 1 L daily  Acute on chronic kidney disease stage IIIb Creatinine 1.43 today Improving over interval Daily renal function Lasix GTT as above Abdomen as above  Cholelithiasis No indication of acute cholecystitis Outpatient follow-up  Essential hypertension Patient on metoprolol  pressure well controlled over interval  History of ascending aortic aneurysm History of abdominal aortic aneurysm CT scan recommended however due to elevated creatinine this is on hold No abdominal pain noted Can likely defer to outpatient  Acute on chronic hypoxic respiratory failure History of COPD On 2  to 3 L nasal cannula Uses oxygen at home as needed Likely secondary to pulmonary vascular congestion Lasix GTT Wean on oxygen as tolerated  DVT prophylaxis: Lovenox  code Status: Full Family Communication: None today  disposition Plan: Status is: Inpatient  Remains inpatient appropriate because:Inpatient level of care appropriate due to severity of illness   Dispo:  Patient From: Home  Planned Disposition: Home  Expected discharge date: 06/30/20  Medically stable for discharge: No  Continuing on Lasix infusion.  Still with significant peripheral edema and hypoxia requiring nasal cannula.  Will likely need additional 1 to 2 days of diuresis on infusion prior to disposition planning.  Consultants:   Cardiology, [facility] clinic  Nephrology  Procedures:   none   Antimicrobials: none   Subjective: Seen and examined.  No specific complaints.  Feels well.  Objective: Vitals:   06/27/20 1958 06/28/20 0516 06/28/20 0952 06/28/20 1224  BP: 105/79 107/68 107/77 102/71  Pulse: 79 81 91 81  Resp: 20 20  19   Temp: 97.9 F (36.6 C) 97.7 F (36.5 C)  98.4 F (36.9 C)  TempSrc: Oral Oral  Oral  SpO2: 96% 96% 98% 96%  Weight:  104.4 kg    Height:        Intake/Output Summary (Last 24 hours) at 06/28/2020 1325 Last data filed at 06/28/2020 1152 Gross per 24 hour  Intake 720 ml  Output 2575 ml  Net -1855 ml   Filed Weights   06/25/20 0533 06/27/20 0557 06/28/20 0516  Weight: 107.8 kg 105.4 kg 104.4 kg    Examination:  General exam: Appears calm and comfortable  Respiratory system: Scattered crackles bilaterally.  Normal work of breathing Cardiovascular system: S1-S2 heard, regular rhythm,  no murmurs, significant 3+ pitting edema bilaterally Gastrointestinal system: Abdomen is nondistended, soft and nontender. No organomegaly or masses felt. Normal bowel sounds heard. Central nervous system: Alert and oriented. No focal neurological deficits. Extremities: Symmetric  5 x 5 power. Skin: No rashes, lesions or ulcers Psychiatry: Judgement and insight appear normal. Mood & affect appropriate.     Data Reviewed: I have personally reviewed following labs and imaging studies  CBC: Recent Labs  Lab 06/23/20 0734 06/28/20 0431  WBC 8.1 6.4  HGB 16.9 15.4  HCT 56.0* 49.6  MCV 106.1* 102.3*  PLT 132* 518*   Basic Metabolic Panel: Recent Labs  Lab 06/24/20 0458 06/25/20 0433 06/26/20 0418 06/27/20 1239 06/28/20 0431  NA 143 142 140 138 140  K 4.1 4.1 3.9 4.0 3.7  CL 90* 91* 90* 88* 91*  CO2 38* 43* 38* 41* 38*  GLUCOSE 104* 105* 86 128* 96  BUN 20 19 19 19 18   CREATININE 1.44* 1.49* 1.46* 1.54* 1.43*  CALCIUM 8.9 8.8* 9.0 8.9 8.8*   GFR: Estimated Creatinine Clearance: 54.9 mL/min (A) (by C-G formula based on SCr of 1.43 mg/dL (H)). Liver Function Tests: No results for input(s): AST, ALT, ALKPHOS, BILITOT, PROT, ALBUMIN in the last 168 hours. No results for input(s): LIPASE, AMYLASE in the last 168 hours. No results for input(s): AMMONIA in the last 168 hours. Coagulation Profile: No results for input(s): INR, PROTIME in the last 168 hours. Cardiac Enzymes: No results for input(s): CKTOTAL, CKMB, CKMBINDEX, TROPONINI in the last 168 hours. BNP (last 3 results) No results for input(s): PROBNP in the last 8760 hours. HbA1C: No results for input(s): HGBA1C in the last 72 hours. CBG: No results for input(s): GLUCAP in the last 168 hours. Lipid Profile: No results for input(s): CHOL, HDL, LDLCALC, TRIG, CHOLHDL, LDLDIRECT in the last 72 hours. Thyroid Function Tests: No results for input(s): TSH, T4TOTAL, FREET4, T3FREE, THYROIDAB in the last 72 hours. Anemia Panel: No results for input(s): VITAMINB12, FOLATE, FERRITIN, TIBC, IRON, RETICCTPCT in the last 72 hours. Sepsis Labs: No results for input(s): PROCALCITON, LATICACIDVEN in the last 168 hours.  Recent Results (from the past 240 hour(s))  SARS Coronavirus 2 by RT PCR (hospital  order, performed in [facility] hospital lab) Nasopharyngeal Nasopharyngeal Swab     Status: None   Collection Time: 06/21/20 10:24 AM   Specimen: Nasopharyngeal Swab  Result Value Ref Range Status   SARS Coronavirus 2 NEGATIVE NEGATIVE Final    Comment: (NOTE) SARS-CoV-2 target nucleic acids are NOT DETECTED.  The SARS-CoV-2 RNA is generally detectable in upper and lower respiratory specimens during the acute phase of infection. The lowest concentration of SARS-CoV-2 viral copies this assay can detect is 250 copies / mL. A negative result does not preclude SARS-CoV-2 infection and should not be used as the sole basis for treatment or other patient management decisions.  A negative result may occur with improper specimen collection / handling, submission of specimen other than nasopharyngeal swab, presence of viral mutation(s) within the areas targeted by this assay, and inadequate number of viral copies (<250 copies / mL). A negative result must be combined with clinical observations, patient history, and epidemiological information.  Fact Sheet for Patients:   StrictlyIdeas.no  Fact Sheet for Healthcare Providers: BankingDealers.co.za  This test is not yet approved or  cleared by the [place] FDA and has been authorized for detection and/or diagnosis of SARS-CoV-2 by FDA under an Emergency Use Authorization (EUA).  This EUA will remain  in effect (meaning this test can be used) for the duration of the COVID-19 declaration under Section 564(b)(1) of the Act, 21 U.S.C. section 360bbb-3(b)(1), unless the authorization is terminated or revoked sooner.  Performed at [facility], [address], [place], [place] [address]          Radiology Studies: No results found.      Scheduled Meds: . aspirin EC  325 mg Oral Daily  . enoxaparin (LOVENOX) injection  40 mg Subcutaneous Q24H  . metoprolol succinate  50 mg  Oral Daily  . polyethylene glycol  17 g Oral Daily  . sodium chloride flush  3 mL Intravenous Q12H   Continuous Infusions: . sodium chloride    . albumin human 25 g (06/28/20 1150)  . furosemide (LASIX) infusion 8 mg/hr (06/28/20 0205)     LOS: 6 days    Time spent: 35 minutes    Sidney Ace, MD [facility]  If 7PM-7AM, please contact night-coverage 06/28/2020, 1:25 PM

## 2020-06-28 NOTE — Progress Notes (Signed)
Dr. Priscella Mann spoke with patient regarding the importance of a second iv. Patient is now agreeable to have second iv started

## 2020-06-28 NOTE — Progress Notes (Addendum)
Attempted to educate patient on the importance of starting a second iv. Currently on continuous iv lasix drip. Educated patient on the importance of not stopping the drip and having a second iv for his iv albumin. Instructed patient that if I was unable to get iv, I would have iv team consult. However patient is addiment on not having second iv started. Patient stated everyone else is stopping the drip to give his albumin. I instructed patient that I am not comfortable with stopping the drip. However I can not force him to get a second iv. Dr. Vic Blackbird was paged to see if it will be okay to stop lasix long enough to give albumin. Per Dr. Vic Blackbird it will be okay to stop lasix drip to give albumin. Will continue to monitor closely

## 2020-06-28 NOTE — Progress Notes (Signed)
[name]  ROUNDING NOTE   Subjective:  Patient continues to have good diuretic effect with Lasix drip. Urine output was 2.5 L over the preceding 24 hours. Renal function remained stable.   Objective:  Vital signs in last 24 hours:  Temp:  [97.7 F (36.5 C)-98.4 F (36.9 C)] 98.4 F (36.9 C) (07/14 1224) Pulse Rate:  [73-91] 81 (07/14 1224) Resp:  [18-20] 19 (07/14 1224) BP: (102-122)/(68-85) 102/71 (07/14 1224) SpO2:  [94 %-98 %] 96 % (07/14 1224) Weight:  [104.4 kg] 104.4 kg (07/14 0516)  Weight change: -1.043 kg Filed Weights   06/25/20 0533 06/27/20 0557 06/28/20 0516  Weight: 107.8 kg 105.4 kg 104.4 kg    Intake/Output: I/O last 3 completed shifts: In: 1031.1 [P.O.:960; I.V.:0.2; IV Piggyback:70.9] Out: 3675 [Urine:3675]   Intake/Output this shift:  Total I/O In: -  Out: 900 [Urine:900]  Physical Exam: General: No acute distress  Head: Normocephalic, atraumatic. Moist oral mucosal membranes  Eyes: Anicteric  Neck: Supple, trachea midline  Lungs:  Clear to auscultation, normal effort  Heart: S1S2 no rubs  Abdomen:  Soft, nontender, bowel sounds present  Extremities: 3+ peripheral edema.  Neurologic: Awake, alert, following commands  Skin: No acute rash       Basic Metabolic Panel: Recent Labs  Lab 06/24/20 0458 06/24/20 0458 06/25/20 0433 06/25/20 0433 06/26/20 0418 06/27/20 1239 06/28/20 0431  NA 143  --  142  --  140 138 140  K 4.1  --  4.1  --  3.9 4.0 3.7  CL 90*  --  91*  --  90* 88* 91*  CO2 38*  --  43*  --  38* 41* 38*  GLUCOSE 104*  --  105*  --  86 128* 96  BUN 20  --  19  --  _0 CREATININE 1.44*  --  1.49*  --  1.46* 1.54* 1.43*  CALCIUM 8.9   < > 8.8*   < > 9.0 8.9 8.8*   < > = values in this interval not displayed.    Liver Function Tests: No results for input(s): AST, ALT, ALKPHOS, BILITOT, PROT, ALBUMIN in the last 168 hours. No results for input(s): LIPASE, AMYLASE in the last 168 hours. No results  for input(s): AMMONIA in the last 168 hours.  CBC: Recent Labs  Lab 06/23/20 0734 06/28/20 0431  WBC 8.1 6.4  HGB 16.9 15.4  HCT 56.0* 49.6  MCV 106.1* 102.3*  PLT 132* 133*    Cardiac Enzymes: No results for input(s): CKTOTAL, CKMB, CKMBINDEX, TROPONINI in the last 168 hours.  BNP: Invalid input(s): POCBNP  CBG: No results for input(s): GLUCAP in the last 168 hours.  Microbiology: Results for orders placed or performed during the hospital encounter of 06/21/20  SARS Coronavirus 2 by RT PCR (hospital order, performed in [facility] hospital lab) Nasopharyngeal Nasopharyngeal Swab     Status: None   Collection Time: 06/21/20 10:24 AM   Specimen: Nasopharyngeal Swab  Result Value Ref Range Status   SARS Coronavirus 2 NEGATIVE NEGATIVE Final    Comment: (NOTE) SARS-CoV-2 target nucleic acids are NOT DETECTED.  The SARS-CoV-2 RNA is generally detectable in upper and lower respiratory specimens during the acute phase of infection. The lowest concentration of SARS-CoV-2 viral copies this assay can detect is 250 copies / mL. A negative result does not preclude SARS-CoV-2 infection and should not be used as the sole basis for treatment or other patient management decisions.  A negative  result may occur with improper specimen collection / handling, submission of specimen other than nasopharyngeal swab, presence of viral mutation(s) within the areas targeted by this assay, and inadequate number of viral copies (<250 copies / mL). A negative result must be combined with clinical observations, patient history, and epidemiological information.  Fact Sheet for Patients:   StrictlyIdeas.no  Fact Sheet for Healthcare Providers: BankingDealers.co.za  This test is not yet approved or  cleared by the [place] FDA and has been authorized for detection and/or diagnosis of SARS-CoV-2 by FDA under an Emergency Use Authorization (EUA).   This EUA will remain in effect (meaning this test can be used) for the duration of the COVID-19 declaration under Section 564(b)(1) of the Act, 21 U.S.C. section 360bbb-3(b)(1), unless the authorization is terminated or revoked sooner.  Performed at [facility], [address] [place],  [address]     Coagulation Studies: No results for input(s): LABPROT, INR in the last 72 hours.  Urinalysis: No results for input(s): COLORURINE, LABSPEC, PHURINE, GLUCOSEU, HGBUR, BILIRUBINUR, KETONESUR, PROTEINUR, UROBILINOGEN, NITRITE, LEUKOCYTESUR in the last 72 hours.  Invalid input(s): APPERANCEUR    Imaging: No results found.   Medications:   . sodium chloride    . albumin human 25 g (06/28/20 1150)  . furosemide (LASIX) infusion 8 mg/hr (06/28/20 0205)   . aspirin EC  325 mg Oral Daily  . enoxaparin (LOVENOX) injection  40 mg Subcutaneous Q24H  . metoprolol succinate  50 mg Oral Daily  . polyethylene glycol  17 g Oral Daily  . sodium chloride flush  3 mL Intravenous Q12H   sodium chloride, acetaminophen, ondansetron (ZOFRAN) IV, sodium chloride flush  Assessment/ Plan:  74 y.o. male with a PMHx of hypertension, coronary disease, chronic kidney disease stage IIIb baseline EGFR 54,, chronic systolic heart failure who was admitted to [facility] on 06/21/2020 for evaluation of increasing generalized edema.   1.  Chronic kidney disease stage IIIa EGFR 56. 2.  Chronic systolic heart failure ejection fraction 45 to 50%. 3.  Generalized edema.  Plan: Patient still has considerable edema but it does appear less tight.  Continue Lasix drip at 8 mg/h.  Encourage the patient to minimize his p.o. liquid intake as much as possible.  He verbalized understanding of this.  Continue combination of Lasix and albumin.  This will likely take several days still to help bring down his generalized edema.   LOS: 6 John [name] 7/14/20212:33 PM

## 2020-06-29 DIAGNOSIS — I10 Essential (primary) hypertension: Secondary | ICD-10-CM | POA: Diagnosis not present

## 2020-06-29 DIAGNOSIS — I5041 Acute combined systolic (congestive) and diastolic (congestive) heart failure: Secondary | ICD-10-CM | POA: Diagnosis not present

## 2020-06-29 LAB — BASIC METABOLIC PANEL
Anion gap: 9 (ref 5–15)
BUN: 19 mg/dL (ref 8–23)
CO2: 40 mmol/L — ABNORMAL HIGH (ref 22–32)
Calcium: 9.2 mg/dL (ref 8.9–10.3)
Chloride: 91 mmol/L — ABNORMAL LOW (ref 98–111)
Creatinine, Ser: 1.4 mg/dL — ABNORMAL HIGH (ref 0.61–1.24)
GFR calc Af Amer: 57 mL/min — ABNORMAL LOW (ref >60)
GFR calc non Af Amer: 49 mL/min — ABNORMAL LOW (ref >60)
Glucose, Bld: 91 mg/dL (ref 70–99)
Potassium: 3.6 mmol/L (ref 3.5–5.1)
Sodium: 140 mmol/L (ref 135–145)

## 2020-06-29 MED ORDER — MAGNESIUM SULFATE 2 GM/50ML IV SOLN
2.0000 g | Freq: Once | INTRAVENOUS | Status: AC
  Administered 2020-06-29: 2 g via INTRAVENOUS
  Filled 2020-06-29: qty 50

## 2020-06-29 MED ORDER — POTASSIUM CHLORIDE CRYS ER 20 MEQ PO TBCR
40.0000 meq | EXTENDED_RELEASE_TABLET | Freq: Once | ORAL | Status: AC
  Administered 2020-06-29: 40 meq via ORAL
  Filled 2020-06-29: qty 2

## 2020-06-29 NOTE — Progress Notes (Signed)
[facility] Encounter Note  Patient: John [name] / Admit Date: 06/21/2020 / Date of Encounter: 06/29/2020, 8:52 AM   Subjective: Patient has slight improvements in weight shortness of breath and lower extremity edema but process is very slow at this time but much better after lasix infusion..  Patient does have slight improvement in pulmonary Rales but overall improving slowly but no apparent low oxygen at this time. No current evidence of myocardial infarction with a troponin of 157 consistent with demand ischemia.  Echocardiogram has shown no significant evidence of worsening LV systolic dysfunction with low normal to slightly hypokinetic global dysfunction and ejection fraction of 45%. This is unchanged from before. It appears that the patient has multiple reasons for heart failure including dietary indiscretion chronic kidney disease and LV systolic dysfunction for which have been a chronic problem. No significant concerns of symptoms today Cannot use ace at this time until ok with nephrololgy Review of Systems: Positive for: Shortness of breath edema Negative for: Vision change, hearing change, syncope, dizziness, nausea, vomiting,diarrhea, bloody stool, stomach pain, cough, congestion, diaphoresis, urinary frequency, urinary pain,skin lesions, skin rashes Others previously listed  Objective: Telemetry: Normal sinus rhythm Physical Exam: Blood pressure 122/81, pulse 82, temperature 98.3 F (36.8 C), temperature source Oral, resp. rate 18, height 5\' 10"  (1.778 m), weight 103.5 kg, SpO2 99 %. Body mass index is 32.74 kg/m. General: Well developed, well nourished, in no acute distress. Head: Normocephalic, atraumatic, sclera non-icteric, no xanthomas, nares are without discharge. Neck: No apparent masses Lungs: Normal respirations with n some o wheezes, no rhonchi, no rales , few crackles   Heart: Regular rate and rhythm, normal S1 S2, no murmur, no rub, no gallop,  PMI is normal size and placement, carotid upstroke normal without bruit, jugular venous pressure normal Abdomen: Soft, non-tender, non-distended with normoactive bowel sounds. No hepatosplenomegaly. Abdominal aorta is normal size without bruit Extremities: 2+ edema, no clubbing, no cyanosis, no ulcers,  Peripheral: 2+ radial, 2+ femoral, zero+ dorsal pedal pulses Neuro: Alert and oriented. Moves all extremities spontaneously. Psych:  Responds to questions appropriately with a normal affect.   Intake/Output Summary (Last 24 hours) at 06/29/2020 0852 Last data filed at 06/29/2020 0813 Gross per 24 hour  Intake 233.2 ml  Output 2500 ml  Net -2266.8 ml    Inpatient Medications:  . aspirin EC  325 mg Oral Daily  . enoxaparin (LOVENOX) injection  40 mg Subcutaneous Q24H  . metoprolol succinate  50 mg Oral Daily  . polyethylene glycol  17 g Oral Daily  . sodium chloride flush  3 mL Intravenous Q12H   Infusions:  . sodium chloride    . albumin human Stopped (06/29/20 0058)  . furosemide (LASIX) infusion 8 mg/hr (06/28/20 1847)  . magnesium sulfate bolus IVPB 2 g (06/29/20 0818)    Labs: Recent Labs    06/28/20 0431 06/29/20 0413  NA 140 140  K 3.7 3.6  CL 91* 91*  CO2 38* 40*  GLUCOSE 96 91  BUN 18 19  CREATININE 1.43* 1.40*  CALCIUM 8.8* 9.2   No results for input(s): AST, ALT, ALKPHOS, BILITOT, PROT, ALBUMIN in the last 72 hours. Recent Labs    06/28/20 0431  WBC 6.4  HGB 15.4  HCT 49.6  MCV 102.3*  PLT 133*   No results for input(s): CKTOTAL, CKMB, TROPONINI in the last 72 hours. Invalid input(s): POCBNP No results for input(s): HGBA1C in the last 72 hours.   Weights: [employer]  06/27/20 0557 06/28/20 0516 06/29/20 0618  Weight: 105.4 kg 104.4 kg 103.5 kg     Radiology/Studies:  US Abdomen Complete  Result Date: 06/23/2020 CLINICAL DATA:  Hyperbilirubinemia, acute renal failure EXAM: ABDOMEN ULTRASOUND COMPLETE COMPARISON:  None. FINDINGS: Gallbladder:  There is layering sludge within the gallbladder, however, the gallbladder is not distended, there is no gallbladder wall thickening, and no pericholecystic fluid is identified. The sonographic Percell Miller sign is reportedly negative. Common bile duct: Diameter: 5 mm in proximal diameter. The distal duct is obscured by overlying bowel gas. Liver: No focal lesion identified. Within normal limits in parenchymal echogenicity. Portal vein is patent on color Doppler imaging with normal direction of blood flow towards the liver. IVC: No abnormality visualized. Pancreas: Visualized portion unremarkable. Spleen: Size and appearance within normal limits. Right Kidney: Length: 9.9 cm (see image 28). Renal cortical thickness is preserved. Cortical echogenicity, however, is diffusely increased in keeping with changes of underlying medical renal disease. There is no hydronephrosis. No intrarenal mass or calcification is identified. Left Kidney: Length: 10.8 cm. Renal cortical thickness is preserved. Cortical echogenicity, however, is diffusely increased in keeping with changes of underlying medical renal disease. There is no hydronephrosis. No intrarenal mass or calcification is identified. Abdominal aorta: A 3.2 cm aneurysm is seen of the mid abdominal aorta. The distal aorta is obscured by overlying bowel gas. Other findings: No ascites IMPRESSION: Cholelithiasis without sonographic evidence of acute cholecystitis. No intra or extrahepatic biliary ductal dilation. Diffusely increased renal cortical echogenicity suggesting changes of underlying medical renal disease. Incomplete evaluation of the abdominal aorta demonstrating a minimum 3.2 cm juxtarenal/infrarenal abdominal aortic aneurysm. This would be better assessed with dedicated CT imaging. Electronically Signed   By: Fidela Salisbury MD   On: 06/23/2020 08:38   US Venous Img Lower Bilateral (DVT)  Result Date: 06/14/2020 CLINICAL DATA:  Bilateral lower extremity swelling  EXAM: BILATERAL LOWER EXTREMITY VENOUS DOPPLER ULTRASOUND TECHNIQUE: Gray-scale sonography with graded compression, as well as color Doppler and duplex ultrasound were performed to evaluate the lower extremity deep venous systems from the level of the common femoral vein and including the common femoral, femoral, profunda femoral, popliteal and calf veins including the posterior tibial, peroneal and gastrocnemius veins when visible. The superficial great saphenous vein was also interrogated. Spectral Doppler was utilized to evaluate flow at rest and with distal augmentation maneuvers in the common femoral, femoral and popliteal veins. COMPARISON:  None. FINDINGS: RIGHT LOWER EXTREMITY Common Femoral Vein: No evidence of thrombus. Normal compressibility, respiratory phasicity and response to augmentation. Saphenofemoral Junction: No evidence of thrombus. Normal compressibility and flow on color Doppler imaging. Profunda Femoral Vein: No evidence of thrombus. Normal compressibility and flow on color Doppler imaging. Femoral Vein: No evidence of thrombus. Normal compressibility, respiratory phasicity and response to augmentation. Popliteal Vein: No evidence of thrombus. Normal compressibility, respiratory phasicity and response to augmentation. Calf Veins: Right calf veins are poorly visualized secondary to edema. No gross thrombus evident. LEFT LOWER EXTREMITY Common Femoral Vein: No evidence of thrombus. Normal compressibility, respiratory phasicity and response to augmentation. Saphenofemoral Junction: No evidence of thrombus. Normal compressibility and flow on color Doppler imaging. Profunda Femoral Vein: No evidence of thrombus. Normal compressibility and flow on color Doppler imaging. Femoral Vein: No evidence of thrombus. Normal compressibility, respiratory phasicity and response to augmentation. Popliteal Vein: No evidence of thrombus. Normal compressibility, respiratory phasicity and response to augmentation.  Calf Veins: No evidence of thrombus. Normal compressibility and flow on color Doppler imaging. IMPRESSION: No  significant DVT in either extremity. Limited assessment of the right calf veins. Electronically Signed   By: Jerilynn Mages.  Shick M.D.   On: 06/14/2020 12:52   DG Chest Portable 1 View  Result Date: 06/21/2020 CLINICAL DATA:  Body swelling for awhile, scrotal swelling for 2 days, hypertension EXAM: PORTABLE CHEST 1 VIEW COMPARISON:  Portable exam 0944 hours compared to 02/16/2020 FINDINGS: Enlargement of cardiac silhouette with pulmonary vascular congestion. Mediastinal contours normal with mild atherosclerotic calcification aorta. RIGHT basilar atelectasis. Remaining lungs clear. No pleural effusion or pneumothorax. IMPRESSION: Enlargement of cardiac silhouette with pulmonary vascular congestion. RIGHT basilar atelectasis. Electronically Signed   By: Lavonia Dana M.D.   On: 06/21/2020 10:00   ECHOCARDIOGRAM COMPLETE  Result Date: 06/22/2020    ECHOCARDIOGRAM REPORT   Patient Name:   John [name] Date of Exam: 06/22/2020 Medical Rec #:  [id number]      Height:       70.0 in Accession #:    [id number]     Weight:       245.2 lb Date of Birth:  Jan 28, 1946     BSA:          2.276 m Patient Age:    74 years       BP:           140/104 mmHg Patient Gender: M              HR:           82 bpm. Exam Location:  [facility] Procedure: 2D Echo, Cardiac Doppler and Color Doppler Indications:     CHF- acute diastolic 193.79  History:         Patient has no prior history of Echocardiogram examinations.                  Risk Factors:Hypertension.  Sonographer:     Sherrie Sport RDCS (AE) Referring Phys:  [place] Diagnosing Phys: Serafina Royals MD IMPRESSIONS  1. Left ventricular ejection fraction, by estimation, is 45 to 50%. The left ventricle has mildly decreased function. The left ventricle has no regional wall motion abnormalities. Left ventricular diastolic parameters were normal.  2. Right ventricular systolic  function is normal. The right ventricular size is normal. There is normal pulmonary artery systolic pressure.  3. The mitral valve is normal in structure. Trivial mitral valve regurgitation.  4. The aortic valve is normal in structure. Aortic valve regurgitation is not visualized. FINDINGS  Left Ventricle: Left ventricular ejection fraction, by estimation, is 45 to 50%. The left ventricle has mildly decreased function. The left ventricle has no regional wall motion abnormalities. The left ventricular internal cavity size was normal in size. There is no left ventricular hypertrophy. Left ventricular diastolic parameters were normal. Right Ventricle: The right ventricular size is normal. No increase in right ventricular wall thickness. Right ventricular systolic function is normal. There is normal pulmonary artery systolic pressure. The tricuspid regurgitant velocity is 2.13 m/s, and  with an assumed right atrial pressure of 10 mmHg, the estimated right ventricular systolic pressure is 02.4 mmHg. Left Atrium: Left atrial size was normal in size. Right Atrium: Right atrial size was normal in size. Pericardium: There is no evidence of pericardial effusion. Mitral Valve: The mitral valve is normal in structure. Trivial mitral valve regurgitation. Tricuspid Valve: The tricuspid valve is normal in structure. Tricuspid valve regurgitation is trivial. Aortic Valve: The aortic valve is normal in structure. Aortic valve regurgitation is not visualized. Aortic valve mean gradient  measures 3.0 mmHg. Aortic valve peak gradient measures 5.1 mmHg. Aortic valve area, by VTI measures 3.18 cm. Pulmonic Valve: The pulmonic valve was normal in structure. Pulmonic valve regurgitation is trivial. Aorta: The aortic root and ascending aorta are structurally normal, with no evidence of dilitation. IAS/Shunts: No atrial level shunt detected by color flow Doppler.  LEFT VENTRICLE PLAX 2D LVIDd:         4.96 cm  Diastology LVIDs:         3.98  cm  LV e' lateral:   5.87 cm/s LV PW:         1.28 cm  LV E/e' lateral: 9.3 LV IVS:        0.98 cm  LV e' medial:    6.20 cm/s LVOT diam:     2.10 cm  LV E/e' medial:  8.8 LV SV:         47 LV SV Index:   21 LVOT Area:     3.46 cm  RIGHT VENTRICLE RV Basal diam:  5.45 cm RV S prime:     14.70 cm/s TAPSE (M-mode): 3.8 cm LEFT ATRIUM              Index       RIGHT ATRIUM           Index LA diam:        3.30 cm  1.45 cm/m  RA Area:     25.00 cm LA Vol (A2C):   113.0 ml 49.65 ml/m RA Volume:   91.60 ml  40.24 ml/m LA Vol (A4C):   45.0 ml  19.77 ml/m LA Biplane Vol: 74.8 ml  32.86 ml/m  AORTIC VALVE                   PULMONIC VALVE AV Area (Vmax):    2.59 cm    PV Vmax:        0.69 m/s AV Area (Vmean):   2.58 cm    PV Peak grad:   1.9 mmHg AV Area (VTI):     3.18 cm    RVOT Peak grad: 2 mmHg AV Vmax:           112.50 cm/s AV Vmean:          80.800 cm/s AV VTI:            0.149 m AV Peak Grad:      5.1 mmHg AV Mean Grad:      3.0 mmHg LVOT Vmax:         84.20 cm/s LVOT Vmean:        60.300 cm/s LVOT VTI:          0.137 m LVOT/AV VTI ratio: 0.92  AORTA Ao Root diam: 4.10 cm MITRAL VALVE               TRICUSPID VALVE MV Area (PHT): 2.91 cm    TR Peak grad:   18.1 mmHg MV Decel Time: 261 msec    TR Vmax:        213.00 cm/s MV E velocity: 54.40 cm/s MV A velocity: 77.60 cm/s  SHUNTS MV E/A ratio:  0.70        Systemic VTI:  0.14 m                            Systemic Diam: 2.10 cm Serafina Royals MD Electronically signed by Serafina Royals MD Signature Date/Time: 06/22/2020/4:58:23 PM  Final    US SCROTUM W/DOPPLER  Result Date: 06/21/2020 CLINICAL DATA:  Scrotal swelling for 2 months. EXAM: SCROTAL ULTRASOUND DOPPLER ULTRASOUND OF THE TESTICLES TECHNIQUE: Complete ultrasound examination of the testicles, epididymis, and other scrotal structures was performed. Color and spectral Doppler ultrasound were also utilized to evaluate blood flow to the testicles. COMPARISON:  None. FINDINGS: Right testicle Measurements:  3.4 x 2.8 x 2.5 cm. No mass or microlithiasis visualized. Left testicle Measurements: 3.3 x 2.7 x 2.6 cm. No mass or microlithiasis visualized. Right epididymis:  Normal in size and appearance. Left epididymis:  Normal in size and appearance. Hydrocele:  Small bilateral hydroceles are noted. Varicocele:  Small left varicocele may be present. Pulsed Doppler interrogation of both testes demonstrates normal low resistance arterial and venous waveforms bilaterally. Bilateral scrotal wall swelling is noted. IMPRESSION: Bilateral scrotal wall swelling is noted concerning for inflammation or edema. No definite evidence of testicular mass or torsion is noted. Small bilateral hydroceles are noted. Electronically Signed   By: Marijo Conception M.D.   On: 06/21/2020 10:44     Assessment and Recommendation  74 y.o. male with acute on chronic combined systolic dysfunction and diastolic dysfunction congestive heart failure more consistent with dietary indiscretion chronic kidney disease and structural functional cardiovascular abnormalities now slightly improved after change to lasix infusion but slow process with no worsening ckd today 1. Continue intravenous Lasix drip as per nephrology 2. No further cardiac diagnostics necessary at this time due to no evidence of myocardial infarction and echocardiogram unchanged from before 3.  Continue metoprolol for current evidence of congestive heart failure but currently avoid ACE inhibitor due to issues of chronic kidney disease at this time as well as hypotension but will consider addition as outpt if able 4. Patient will need congestive heart failure clinic consultation for dietary issues and congestive heart failure concerns due to concerns that the patient may have reoccurrence if not having enough further education 5. Ambulation and follow for ability for dc 6.will sign off case at this time and follow as outpt in next few weeks Call if further questions Signed, Serafina Royals M.D. FACC

## 2020-06-29 NOTE — Progress Notes (Addendum)
PROGRESS NOTE    John [name]  ZOX:[id number] DOB: 1946/06/04 DOA: 06/21/2020 PCP: Tracie Harrier, MD  Brief Narrative:  74 y.o.malewith medical history significant forhypertension and coronary artery disease who presented to the emergency room for evaluation of generalized body swelling but mostly in the scrotal area and lower extremity. Patient states that he has been compliant with his Lasix 80 mg daily without any improvement in the swelling. He complains of difficulty voiding due to the scrotal swelling. He denies any dietary indiscretion.  Patient was admitted for further management of anasarca.  7/14: Patient seen and examined.  Remains on Lasix infusion.  Diuresing well.  Net -10 L over admission.  Reluctant to agree to second IV placement for concomitant administration of albumin.  I spoke to patient.  He was in agreement to having 2nd IV line placed  7/15: Patient seen and examined.  Continues to diurese well.  Remains on Lasix and albumin infusion.  Net -2.3 L past 24 hours.  Net -13.4 L overall since admission   Assessment & Plan:   Principal Problem:   Acute CHF (congestive heart failure) (HCC) Active Problems:   Hypertension  Anasarca Acute on chronic systolic and diastolic congestive heart failure Initially did not respond to Lasix pushes Lasix GTT initiated with good result Net -13 L over admission Case discussed with nephrology Plan: Continue Lasix GTT Continue albumin bolus Monitor urine output Daily weights Fluid restrict, 1 L daily  Acute on chronic kidney disease stage IIIb Creatinine 1.43 today Improving over interval Daily renal function Lasix GTT as above Abdomen as above  Cholelithiasis No indication of acute cholecystitis Outpatient follow-up  Essential hypertension Patient on metoprolol  pressure well controlled over interval  History of ascending aortic aneurysm History of abdominal aortic aneurysm CT scan recommended however  due to elevated creatinine this is on hold No abdominal pain noted Can likely defer to outpatient  Acute on chronic hypoxic respiratory failure History of COPD On 2 to 3 L nasal cannula Uses oxygen at home as needed Likely secondary to pulmonary vascular congestion Lasix GTT Wean on oxygen as tolerated  DVT prophylaxis: Lovenox  code Status: Full Family Communication: None today  disposition Plan: Status is: Inpatient  Remains inpatient appropriate because:Inpatient level of care appropriate due to severity of illness   Dispo:  Patient From: Home  Planned Disposition: Home  Expected discharge date: 06/30/20  Medically stable for discharge: No  Continuing on Lasix infusion.  Still with significant peripheral edema and hypoxia requiring nasal cannula.  Will likely need additional 1 to 2 days of diuresis on infusion prior to disposition planning.  Consultants:   Cardiology, [facility]  Nephrology  Procedures:   none   Antimicrobials: none   Subjective: Seen and examined.  No specific complaints.  Feels well.  Objective: Vitals:   06/28/20 1933 06/29/20 0618 06/29/20 0746 06/29/20 1137  BP: 115/81 123/78 122/81 121/86  Pulse: 82 87 82 81  Resp:   18 18  Temp: 97.9 F (36.6 C) 98 F (36.7 C) 98.3 F (36.8 C) 98 F (36.7 C)  TempSrc:   Oral Oral  SpO2: 98% 95% 99% 96%  Weight:  103.5 kg    Height:        Intake/Output Summary (Last 24 hours) at 06/29/2020 1304 Last data filed at 06/29/2020 0930 Gross per 24 hour  Intake 473.2 ml  Output 2000 ml  Net -1526.8 ml   Filed Weights   06/27/20 0557 06/28/20 0516 06/29/20 8119  Weight: 105.4 kg 104.4 kg 103.5 kg    Examination:  General exam: Appears calm and comfortable  Respiratory system: Scattered crackles bilaterally.  Normal work of breathing Cardiovascular system: S1-S2 heard, regular rhythm, no murmurs, tense pitting edema bilateral lower extremities  gastrointestinal system: Abdomen is  nondistended, soft and nontender. No organomegaly or masses felt. Normal bowel sounds heard. Central nervous system: Alert and oriented. No focal neurological deficits. Extremities: Symmetric 5 x 5 power. Skin: No rashes, lesions or ulcers Psychiatry: Judgement and insight appear normal. Mood & affect appropriate.     Data Reviewed: I have personally reviewed following labs and imaging studies  CBC: Recent Labs  Lab 06/23/20 0734 06/28/20 0431  WBC 8.1 6.4  HGB 16.9 15.4  HCT 56.0* 49.6  MCV 106.1* 102.3*  PLT 132* 025*   Basic Metabolic Panel: Recent Labs  Lab 06/25/20 0433 06/26/20 0418 06/27/20 1239 06/28/20 0431 06/29/20 0413  NA 142 140 138 140 140  K 4.1 3.9 4.0 3.7 3.6  CL 91* 90* 88* 91* 91*  CO2 43* 38* 41* 38* 40*  GLUCOSE 105* 86 128* 96 91  BUN 19 19 19 18 19   CREATININE 1.49* 1.46* 1.54* 1.43* 1.40*  CALCIUM 8.8* 9.0 8.9 8.8* 9.2   GFR: Estimated Creatinine Clearance: 55.8 mL/min (A) (by C-G formula based on SCr of 1.4 mg/dL (H)). Liver Function Tests: No results for input(s): AST, ALT, ALKPHOS, BILITOT, PROT, ALBUMIN in the last 168 hours. No results for input(s): LIPASE, AMYLASE in the last 168 hours. No results for input(s): AMMONIA in the last 168 hours. Coagulation Profile: No results for input(s): INR, PROTIME in the last 168 hours. Cardiac Enzymes: No results for input(s): CKTOTAL, CKMB, CKMBINDEX, TROPONINI in the last 168 hours. BNP (last 3 results) No results for input(s): PROBNP in the last 8760 hours. HbA1C: No results for input(s): HGBA1C in the last 72 hours. CBG: No results for input(s): GLUCAP in the last 168 hours. Lipid Profile: No results for input(s): CHOL, HDL, LDLCALC, TRIG, CHOLHDL, LDLDIRECT in the last 72 hours. Thyroid Function Tests: No results for input(s): TSH, T4TOTAL, FREET4, T3FREE, THYROIDAB in the last 72 hours. Anemia Panel: No results for input(s): VITAMINB12, FOLATE, FERRITIN, TIBC, IRON, RETICCTPCT in the  last 72 hours. Sepsis Labs: No results for input(s): PROCALCITON, LATICACIDVEN in the last 168 hours.  Recent Results (from the past 240 hour(s))  SARS Coronavirus 2 by RT PCR (hospital order, performed in [facility] hospital lab) Nasopharyngeal Nasopharyngeal Swab     Status: None   Collection Time: 06/21/20 10:24 AM   Specimen: Nasopharyngeal Swab  Result Value Ref Range Status   SARS Coronavirus 2 NEGATIVE NEGATIVE Final    Comment: (NOTE) SARS-CoV-2 target nucleic acids are NOT DETECTED.  The SARS-CoV-2 RNA is generally detectable in upper and lower respiratory specimens during the acute phase of infection. The lowest concentration of SARS-CoV-2 viral copies this assay can detect is 250 copies / mL. A negative result does not preclude SARS-CoV-2 infection and should not be used as the sole basis for treatment or other patient management decisions.  A negative result may occur with improper specimen collection / handling, submission of specimen other than nasopharyngeal swab, presence of viral mutation(s) within the areas targeted by this assay, and inadequate number of viral copies (<250 copies / mL). A negative result must be combined with clinical observations, patient history, and epidemiological information.  Fact Sheet for Patients:   StrictlyIdeas.no  Fact Sheet for Healthcare Providers: BankingDealers.co.za  This test is not yet approved or  cleared by the [place] and has been authorized for detection and/or diagnosis of SARS-CoV-2 by FDA under an Emergency Use Authorization (EUA).  This EUA will remain in effect (meaning this test can be used) for the duration of the COVID-19 declaration under Section 564(b)(1) of the Act, 21 U.S.C. section 360bbb-3(b)(1), unless the authorization is terminated or revoked sooner.  Performed at [facility], [address] [place], [place] [address]           Radiology Studies: No results found.      Scheduled Meds: . aspirin EC  325 mg Oral Daily  . enoxaparin (LOVENOX) injection  40 mg Subcutaneous Q24H  . metoprolol succinate  50 mg Oral Daily  . polyethylene glycol  17 g Oral Daily  . sodium chloride flush  3 mL Intravenous Q12H   Continuous Infusions: . sodium chloride    . albumin human 25 g (06/29/20 0945)  . furosemide (LASIX) infusion 8 mg/hr (06/29/20 0943)     LOS: 7 days    Time spent: 35 minutes    Sidney Ace, MD [facility]  If 7PM-7AM, please contact night-coverage 06/29/2020, 1:04 PM

## 2020-06-29 NOTE — Progress Notes (Signed)
[name]  ROUNDING NOTE   Subjective:  Patient sitting up in chair this afternoon. Urine output yesterday was 2.2 L. Still has considerable lower extremity edema His weight is trending down by 1 kg approximately per day.  Objective:  Vital signs in last 24 hours:  Temp:  [97.9 F (36.6 C)-98.3 F (36.8 C)] 97.9 F (36.6 C) (07/15 1538) Pulse Rate:  [81-87] 83 (07/15 1538) Resp:  [18] 18 (07/15 1538) BP: (115-123)/(78-86) 123/83 (07/15 1538) SpO2:  [90 %-99 %] 90 % (07/15 1538) FiO2 (%):  [3 %] 3 % (07/15 0618) Weight:  [103.5 kg] 103.5 kg (07/15 0618)  Weight change: -0.862 kg Filed Weights   06/27/20 0557 06/28/20 0516 06/29/20 0618  Weight: 105.4 kg 104.4 kg 103.5 kg    Intake/Output: I/O last 3 completed shifts: In: 233.2 [I.V.:133.2; IV Piggyback:100] Out: 3400 [WNUUV:2536]   Intake/Output this shift:  Total I/O In: 888.1 [P.O.:720; I.V.:79.9; IV Piggyback:88.3] Out: 1650 [Urine:1650]  Physical Exam: General: No acute distress  Head: Normocephalic, atraumatic. Moist oral mucosal membranes  Eyes: Anicteric  Neck: Supple, trachea midline  Lungs:  Clear to auscultation, normal effort  Heart: S1S2 no rubs  Abdomen:  Soft, nontender, bowel sounds present  Extremities: 3+ peripheral edema.  Neurologic: Awake, alert, following commands  Skin: No acute rash       Basic Metabolic Panel: Recent Labs  Lab 06/25/20 0433 06/25/20 0433 06/26/20 0418 06/26/20 0418 06/27/20 1239 06/28/20 0431 06/29/20 0413  NA 142  --  140  --  138 140 140  K 4.1  --  3.9  --  4.0 3.7 3.6  CL 91*  --  90*  --  88* 91* 91*  CO2 43*  --  38*  --  41* 38* 40*  GLUCOSE 105*  --  86  --  128* 96 91  BUN 19  --  19  --  _0 CREATININE 1.49*  --  1.46*  --  1.54* 1.43* 1.40*  CALCIUM 8.8*   < > 9.0   < > 8.9 8.8* 9.2   < > = values in this interval not displayed.    Liver Function Tests: No results for input(s): AST, ALT, ALKPHOS, BILITOT, PROT, ALBUMIN in  the last 168 hours. No results for input(s): LIPASE, AMYLASE in the last 168 hours. No results for input(s): AMMONIA in the last 168 hours.  CBC: Recent Labs  Lab 06/23/20 0734 06/28/20 0431  WBC 8.1 6.4  HGB 16.9 15.4  HCT 56.0* 49.6  MCV 106.1* 102.3*  PLT 132* 133*    Cardiac Enzymes: No results for input(s): CKTOTAL, CKMB, CKMBINDEX, TROPONINI in the last 168 hours.  BNP: Invalid input(s): POCBNP  CBG: No results for input(s): GLUCAP in the last 168 hours.  Microbiology: Results for orders placed or performed during the hospital encounter of 06/21/20  SARS Coronavirus 2 by RT PCR (hospital order, performed in [facility] hospital lab) Nasopharyngeal Nasopharyngeal Swab     Status: None   Collection Time: 06/21/20 10:24 AM   Specimen: Nasopharyngeal Swab  Result Value Ref Range Status   SARS Coronavirus 2 NEGATIVE NEGATIVE Final    Comment: (NOTE) SARS-CoV-2 target nucleic acids are NOT DETECTED.  The SARS-CoV-2 RNA is generally detectable in upper and lower respiratory specimens during the acute phase of infection. The lowest concentration of SARS-CoV-2 viral copies this assay can detect is 250 copies / mL. A negative result does not preclude SARS-CoV-2 infection and should not  be used as the sole basis for treatment or other patient management decisions.  A negative result may occur with improper specimen collection / handling, submission of specimen other than nasopharyngeal swab, presence of viral mutation(s) within the areas targeted by this assay, and inadequate number of viral copies (<250 copies / mL). A negative result must be combined with clinical observations, patient history, and epidemiological information.  Fact Sheet for Patients:   StrictlyIdeas.no  Fact Sheet for Healthcare Providers: BankingDealers.co.za  This test is not yet approved or  cleared by the [place] FDA and has been authorized  for detection and/or diagnosis of SARS-CoV-2 by FDA under an Emergency Use Authorization (EUA).  This EUA will remain in effect (meaning this test can be used) for the duration of the COVID-19 declaration under Section 564(b)(1) of the Act, 21 U.S.C. section 360bbb-3(b)(1), unless the authorization is terminated or revoked sooner.  Performed at [facility], [address] [place], [place] [address]     Coagulation Studies: No results for input(s): LABPROT, INR in the last 72 hours.  Urinalysis: No results for input(s): COLORURINE, LABSPEC, PHURINE, GLUCOSEU, HGBUR, BILIRUBINUR, KETONESUR, PROTEINUR, UROBILINOGEN, NITRITE, LEUKOCYTESUR in the last 72 hours.  Invalid input(s): APPERANCEUR    Imaging: No results found.   Medications:   . sodium chloride    . albumin human 25 g (06/29/20 0945)  . furosemide (LASIX) infusion 8 mg/hr (06/29/20 0943)   . aspirin EC  325 mg Oral Daily  . enoxaparin (LOVENOX) injection  40 mg Subcutaneous Q24H  . metoprolol succinate  50 mg Oral Daily  . polyethylene glycol  17 g Oral Daily  . sodium chloride flush  3 mL Intravenous Q12H   sodium chloride, acetaminophen, ondansetron (ZOFRAN) IV, sodium chloride flush  Assessment/ Plan:  74 y.o. male with a PMHx of hypertension, coronary disease, chronic kidney disease stage IIIb baseline EGFR 54,, chronic systolic heart failure who was admitted to [facility] on 06/21/2020 for evaluation of increasing generalized edema.   1.  Chronic kidney disease stage IIIa EGFR 56. 2.  Chronic systolic heart failure ejection fraction 45 to 50%. 3.  Generalized edema.  Plan: Renal function remains stable with a creatinine of 1.4.  Serum bicarbonate is rising and may represent some element of contraction alkalosis.  Therefore hesitant to increase Lasix dose but patient still has considerable edema and we will continue current regimen including albumin infusion.  Explained plan of care in depth to the  patient today.  LOS: 7 Ata Pecha 7/15/20216:06 PM

## 2020-06-29 NOTE — Care Management Important Message (Signed)
Important Message  Patient Details  Name: John [name] MRN: [id number] Date of Birth: Feb 24, 1946   Medicare Important Message Given:  Yes     [name] 06/29/2020, 3:07 PM

## 2020-06-30 DIAGNOSIS — I5041 Acute combined systolic (congestive) and diastolic (congestive) heart failure: Secondary | ICD-10-CM | POA: Diagnosis not present

## 2020-06-30 DIAGNOSIS — I10 Essential (primary) hypertension: Secondary | ICD-10-CM | POA: Diagnosis not present

## 2020-06-30 LAB — BASIC METABOLIC PANEL
Anion gap: 9 (ref 5–15)
BUN: 22 mg/dL (ref 8–23)
CO2: 40 mmol/L — ABNORMAL HIGH (ref 22–32)
Calcium: 9.6 mg/dL (ref 8.9–10.3)
Chloride: 92 mmol/L — ABNORMAL LOW (ref 98–111)
Creatinine, Ser: 1.42 mg/dL — ABNORMAL HIGH (ref 0.61–1.24)
GFR calc Af Amer: 56 mL/min — ABNORMAL LOW (ref >60)
GFR calc non Af Amer: 48 mL/min — ABNORMAL LOW (ref >60)
Glucose, Bld: 108 mg/dL — ABNORMAL HIGH (ref 70–99)
Potassium: 3.8 mmol/L (ref 3.5–5.1)
Sodium: 141 mmol/L (ref 135–145)

## 2020-06-30 LAB — MAGNESIUM: Magnesium: 2.5 mg/dL — ABNORMAL HIGH (ref 1.7–2.4)

## 2020-06-30 MED ORDER — POTASSIUM CHLORIDE CRYS ER 20 MEQ PO TBCR
40.0000 meq | EXTENDED_RELEASE_TABLET | Freq: Once | ORAL | Status: AC
  Administered 2020-06-30: 40 meq via ORAL
  Filled 2020-06-30: qty 2

## 2020-06-30 NOTE — Progress Notes (Signed)
PROGRESS NOTE    John [name]  TGG:[id number] DOB: 1946-07-20 DOA: 06/21/2020 PCP: Tracie Harrier, MD  Brief Narrative:  74 y.o.malewith medical history significant forhypertension and coronary artery disease who presented to the emergency room for evaluation of generalized body swelling but mostly in the scrotal area and lower extremity. Patient states that he has been compliant with his Lasix 80 mg daily without any improvement in the swelling. He complains of difficulty voiding due to the scrotal swelling. He denies any dietary indiscretion.  Patient was admitted for further management of anasarca.  7/14: Patient seen and examined.  Remains on Lasix infusion.  Diuresing well.  Net -10 L over admission.  Reluctant to agree to second IV placement for concomitant administration of albumin.  I spoke to patient.  He was in agreement to having 2nd IV line placed  7/15: Patient seen and examined.  Continues to diurese well.  Remains on Lasix and albumin infusion.  Net -2.3 L past 24 hours.  Net -13.4 L overall since admission  7/16: Patient seen and examined.  Continues to diuresis.  Net -1 L her past 24 hours.  Average weight loss of 1 kg daily.  Still with notable lower extremity edema, right greater than left   Assessment & Plan:   Principal Problem:   Acute CHF (congestive heart failure) (HCC) Active Problems:   Hypertension  Anasarca Acute on chronic systolic and diastolic congestive heart failure Initially did not respond to Lasix pushes Lasix GTT initiated with good result Net -14.6 L over admission Case discussed with nephrology Plan: Continue Lasix GTT Continue albumin bolus Monitor urine output Daily weights Fluid restrict, 1 L daily Anticipate patient will need a few more days on Lasix/albumin prior to disposition planning.  Acute on chronic kidney disease stage IIIb Creatinine 1.43 today Improving over interval Daily renal function Lasix GTT as  above Abdomen as above  Cholelithiasis No indication of acute cholecystitis Outpatient follow-up  Essential hypertension Patient on metoprolol  pressure well controlled over interval  History of ascending aortic aneurysm History of abdominal aortic aneurysm CT scan recommended however due to elevated creatinine this is on hold No abdominal pain noted Can likely defer to outpatient  Acute on chronic hypoxic respiratory failure History of COPD On 2 to 3 L nasal cannula Uses oxygen at home as needed Likely secondary to pulmonary vascular congestion Lasix GTT Wean on oxygen as tolerated  DVT prophylaxis: Lovenox  code Status: Full Family Communication: None today  disposition Plan: Status is: Inpatient  Remains inpatient appropriate because:Inpatient level of care appropriate due to severity of illness   Dispo:  Patient From: Home  Planned Disposition: Home  Expected discharge date: 07/02/20  Medically stable for discharge: No  Continuing on Lasix infusion.  Still with significant peripheral edema and hypoxia requiring nasal cannula.  Will likely need additional 1 to 2 days of diuresis on infusion prior to disposition planning.  Consultants:   Cardiology, [facility] clinic  Nephrology  Procedures:   none   Antimicrobials: none   Subjective: Seen and examined.  No specific complaints.  Feels well.  Objective: Vitals:   06/29/20 2000 06/30/20 0506 06/30/20 0716 06/30/20 0739  BP: 128/82 114/74  (!) 122/97  Pulse: 80 80  84  Resp: 18 20  18   Temp: 98.4 F (36.9 C) (!) 97.5 F (36.4 C)  98 F (36.7 C)  TempSrc: Oral Oral  Oral  SpO2: 99% 96%  93%  Weight:   101.5 kg   Height:  Intake/Output Summary (Last 24 hours) at 06/30/2020 1046 Last data filed at 06/30/2020 0731 Gross per 24 hour  Intake 1081.96 ml  Output 2300 ml  Net -1218.04 ml   Filed Weights   06/28/20 0516 06/29/20 0618 06/30/20 0716  Weight: 104.4 kg 103.5 kg 101.5 kg     Examination:  General exam: Appears calm and comfortable  Respiratory system: Scattered crackles bilaterally.  Normal work of breathing Cardiovascular system: S1-S2 heard, regular rhythm, no murmurs, tense pitting edema bilateral lower extremities  gastrointestinal system: Abdomen is nondistended, soft and nontender. No organomegaly or masses felt. Normal bowel sounds heard. Central nervous system: Alert and oriented. No focal neurological deficits. Extremities: Symmetric 5 x 5 power. Skin: No rashes, lesions or ulcers Psychiatry: Judgement and insight appear normal. Mood & affect appropriate.     Data Reviewed: I have personally reviewed following labs and imaging studies  CBC: Recent Labs  Lab 06/28/20 0431  WBC 6.4  HGB 15.4  HCT 49.6  MCV 102.3*  PLT 967*   Basic Metabolic Panel: Recent Labs  Lab 06/26/20 0418 06/27/20 1239 06/28/20 0431 06/29/20 0413 06/30/20 0313  NA 140 138 140 140 141  K 3.9 4.0 3.7 3.6 3.8  CL 90* 88* 91* 91* 92*  CO2 38* 41* 38* 40* 40*  GLUCOSE 86 128* 96 91 108*  BUN 19 19 18 19 22   CREATININE 1.46* 1.54* 1.43* 1.40* 1.42*  CALCIUM 9.0 8.9 8.8* 9.2 9.6  MG  --   --   --   --  2.5*   GFR: Estimated Creatinine Clearance: 54.5 mL/min (A) (by C-G formula based on SCr of 1.42 mg/dL (H)). Liver Function Tests: No results for input(s): AST, ALT, ALKPHOS, BILITOT, PROT, ALBUMIN in the last 168 hours. No results for input(s): LIPASE, AMYLASE in the last 168 hours. No results for input(s): AMMONIA in the last 168 hours. Coagulation Profile: No results for input(s): INR, PROTIME in the last 168 hours. Cardiac Enzymes: No results for input(s): CKTOTAL, CKMB, CKMBINDEX, TROPONINI in the last 168 hours. BNP (last 3 results) No results for input(s): PROBNP in the last 8760 hours. HbA1C: No results for input(s): HGBA1C in the last 72 hours. CBG: No results for input(s): GLUCAP in the last 168 hours. Lipid Profile: No results for input(s):  CHOL, HDL, LDLCALC, TRIG, CHOLHDL, LDLDIRECT in the last 72 hours. Thyroid Function Tests: No results for input(s): TSH, T4TOTAL, FREET4, T3FREE, THYROIDAB in the last 72 hours. Anemia Panel: No results for input(s): VITAMINB12, FOLATE, FERRITIN, TIBC, IRON, RETICCTPCT in the last 72 hours. Sepsis Labs: No results for input(s): PROCALCITON, LATICACIDVEN in the last 168 hours.  Recent Results (from the past 240 hour(s))  SARS Coronavirus 2 by RT PCR (hospital order, performed in [facility] hospital lab) Nasopharyngeal Nasopharyngeal Swab     Status: None   Collection Time: 06/21/20 10:24 AM   Specimen: Nasopharyngeal Swab  Result Value Ref Range Status   SARS Coronavirus 2 NEGATIVE NEGATIVE Final    Comment: (NOTE) SARS-CoV-2 target nucleic acids are NOT DETECTED.  The SARS-CoV-2 RNA is generally detectable in upper and lower respiratory specimens during the acute phase of infection. The lowest concentration of SARS-CoV-2 viral copies this assay can detect is 250 copies / mL. A negative result does not preclude SARS-CoV-2 infection and should not be used as the sole basis for treatment or other patient management decisions.  A negative result may occur with improper specimen collection / handling, submission of specimen other than nasopharyngeal swab,  presence of viral mutation(s) within the areas targeted by this assay, and inadequate number of viral copies (<250 copies / mL). A negative result must be combined with clinical observations, patient history, and epidemiological information.  Fact Sheet for Patients:   StrictlyIdeas.no  Fact Sheet for Healthcare Providers: BankingDealers.co.za  This test is not yet approved or  cleared by the [place] FDA and has been authorized for detection and/or diagnosis of SARS-CoV-2 by FDA under an Emergency Use Authorization (EUA).  This EUA will remain in effect (meaning this test can be  used) for the duration of the COVID-19 declaration under Section 564(b)(1) of the Act, 21 U.S.C. section 360bbb-3(b)(1), unless the authorization is terminated or revoked sooner.  Performed at [facility], [address], [place], [place] [address]          Radiology Studies: No results found.      Scheduled Meds: . aspirin EC  325 mg Oral Daily  . enoxaparin (LOVENOX) injection  40 mg Subcutaneous Q24H  . metoprolol succinate  50 mg Oral Daily  . polyethylene glycol  17 g Oral Daily  . potassium chloride  40 mEq Oral Once  . sodium chloride flush  3 mL Intravenous Q12H   Continuous Infusions: . sodium chloride    . albumin human Stopped (06/30/20 0016)  . furosemide (LASIX) infusion 8 mg/hr (06/30/20 0553)     LOS: 8 days    Time spent: 35 minutes    Sidney Ace, MD [facility]  If 7PM-7AM, please contact night-coverage 06/30/2020, 10:46 AM

## 2020-06-30 NOTE — Progress Notes (Signed)
[name]  ROUNDING NOTE   Subjective:  Patient had better diuretic response yesterday as urine output was up to 2.7 L. His weight is down to 1 to 1.5 kg. This is an encouraging sign.  Objective:  Vital signs in last 24 hours:  Temp:  [97.5 F (36.4 C)-98.4 F (36.9 C)] 98 F (36.7 C) (07/16 0739) Pulse Rate:  [80-84] 84 (07/16 0739) Resp:  [18-20] 18 (07/16 0739) BP: (114-128)/(74-97) 122/97 (07/16 0739) SpO2:  [90 %-99 %] 93 % (07/16 0739) Weight:  [101.5 kg] 101.5 kg (07/16 0716)  Weight change:  Filed Weights   06/28/20 0516 06/29/20 0618 06/30/20 0716  Weight: 104.4 kg 103.5 kg 101.5 kg    Intake/Output: I/O last 3 completed shifts: In: 1572 [P.O.:960; I.V.:182.9; IV Piggyback:179] Out: 6203 [Urine:4050]   Intake/Output this shift:  Total I/O In: -  Out: 500 [Urine:500]  Physical Exam: General: No acute distress  Head: Normocephalic, atraumatic. Moist oral mucosal membranes  Eyes: Anicteric  Neck: Supple, trachea midline  Lungs:  Clear to auscultation, normal effort  Heart: S1S2 no rubs  Abdomen:  Soft, nontender, bowel sounds present  Extremities: 3+ peripheral edema, less tight  Neurologic: Awake, alert, following commands  Skin: No acute rash       Basic Metabolic Panel: Recent Labs  Lab 06/26/20 0418 06/26/20 0418 06/27/20 1239 06/27/20 1239 06/28/20 0431 06/29/20 0413 06/30/20 0313  NA 140  --  138  --  140 140 141  K 3.9  --  4.0  --  3.7 3.6 3.8  CL 90*  --  88*  --  91* 91* 92*  CO2 38*  --  41*  --  38* 40* 40*  GLUCOSE 86  --  128*  --  96 91 108*  BUN 19  --  19  --  '18 19 22  '$ CREATININE 1.46*  --  1.54*  --  1.43* 1.40* 1.42*  CALCIUM 9.0   < > 8.9   < > 8.8* 9.2 9.6  MG  --   --   --   --   --   --  2.5*   < > = values in this interval not displayed.    Liver Function Tests: No results for input(s): AST, ALT, ALKPHOS, BILITOT, PROT, ALBUMIN in the last 168 hours. No results for input(s): LIPASE, AMYLASE in the  last 168 hours. No results for input(s): AMMONIA in the last 168 hours.  CBC: Recent Labs  Lab 06/28/20 0431  WBC 6.4  HGB 15.4  HCT 49.6  MCV 102.3*  PLT 133*    Cardiac Enzymes: No results for input(s): CKTOTAL, CKMB, CKMBINDEX, TROPONINI in the last 168 hours.  BNP: Invalid input(s): POCBNP  CBG: No results for input(s): GLUCAP in the last 168 hours.  Microbiology: Results for orders placed or performed during the hospital encounter of 06/21/20  SARS Coronavirus 2 by RT PCR (hospital order, performed in [facility] hospital lab) Nasopharyngeal Nasopharyngeal Swab     Status: None   Collection Time: 06/21/20 10:24 AM   Specimen: Nasopharyngeal Swab  Result Value Ref Range Status   SARS Coronavirus 2 NEGATIVE NEGATIVE Final    Comment: (NOTE) SARS-CoV-2 target nucleic acids are NOT DETECTED.  The SARS-CoV-2 RNA is generally detectable in upper and lower respiratory specimens during the acute phase of infection. The lowest concentration of SARS-CoV-2 viral copies this assay can detect is 250 copies / mL. A negative result does not preclude SARS-CoV-2 infection and  should not be used as the sole basis for treatment or other patient management decisions.  A negative result may occur with improper specimen collection / handling, submission of specimen other than nasopharyngeal swab, presence of viral mutation(s) within the areas targeted by this assay, and inadequate number of viral copies (<250 copies / mL). A negative result must be combined with clinical observations, patient history, and epidemiological information.  Fact Sheet for Patients:   StrictlyIdeas.no  Fact Sheet for Healthcare Providers: BankingDealers.co.za  This test is not yet approved or  cleared by the [place] FDA and has been authorized for detection and/or diagnosis of SARS-CoV-2 by FDA under an Emergency Use Authorization (EUA).  This EUA will  remain in effect (meaning this test can be used) for the duration of the COVID-19 declaration under Section 564(b)(1) of the Act, 21 U.S.C. section 360bbb-3(b)(1), unless the authorization is terminated or revoked sooner.  Performed at [facility], [address] [place], [place] [address]     Coagulation Studies: No results for input(s): LABPROT, INR in the last 72 hours.  Urinalysis: No results for input(s): COLORURINE, LABSPEC, PHURINE, GLUCOSEU, HGBUR, BILIRUBINUR, KETONESUR, PROTEINUR, UROBILINOGEN, NITRITE, LEUKOCYTESUR in the last 72 hours.  Invalid input(s): APPERANCEUR    Imaging: No results found.   Medications:   . sodium chloride    . albumin human Stopped (06/30/20 0016)  . furosemide (LASIX) infusion 8 mg/hr (06/30/20 0553)   . aspirin EC  325 mg Oral Daily  . enoxaparin (LOVENOX) injection  40 mg Subcutaneous Q24H  . metoprolol succinate  50 mg Oral Daily  . polyethylene glycol  17 g Oral Daily  . sodium chloride flush  3 mL Intravenous Q12H   sodium chloride, acetaminophen, ondansetron (ZOFRAN) IV, sodium chloride flush  Assessment/ Plan:  74 y.o. male with a PMHx of hypertension, coronary disease, chronic kidney disease stage IIIb baseline EGFR 54,, chronic systolic heart failure who was admitted to [facility] on 06/21/2020 for evaluation of increasing generalized edema.   1.  Chronic kidney disease stage IIIa EGFR 56. 2.  Chronic systolic heart failure ejection fraction 45 to 50%. 3.  Generalized edema.  Plan: Overall the patient's edema is slowly improving with diuresis.  Patient had good urine output of 2.7 L and there was a 2 kg weight change.  This is an encouraging sign.  Continue Lasix drip at 8 mg/h as well as albumin.  Renal function remained stable with a creatinine of 1.4 and EGFR of 56.  Continue to monitor renal function and serum electrolytes.  Patient making slow but steady progress.  Hesitant to increase Lasix drip any further as  serum bicarbonate is up to 40.   LOS: 8 Mishti Swanton 7/16/202111:51 AM

## 2020-06-30 NOTE — Plan of Care (Signed)
  Problem: Education: Goal: Knowledge of General Education information will improve Description: Including pain rating scale, medication(s)/side effects and non-pharmacologic comfort measures Outcome: Progressing   Problem: Health Behavior/Discharge Planning: Goal: Ability to manage health-related needs will improve Outcome: Progressing   Problem: Elimination: Goal: Will not experience complications related to bowel motility Outcome: Progressing Goal: Will not experience complications related to urinary retention Outcome: Progressing   Problem: Education: Goal: Ability to verbalize understanding of medication therapies will improve Outcome: Progressing   Problem: Activity: Goal: Capacity to carry out activities will improve Outcome: Progressing   Problem: Cardiac: Goal: Ability to achieve and maintain adequate cardiopulmonary perfusion will improve Outcome: Progressing

## 2020-07-01 DIAGNOSIS — I10 Essential (primary) hypertension: Secondary | ICD-10-CM | POA: Diagnosis not present

## 2020-07-01 DIAGNOSIS — I5041 Acute combined systolic (congestive) and diastolic (congestive) heart failure: Secondary | ICD-10-CM | POA: Diagnosis not present

## 2020-07-01 LAB — BASIC METABOLIC PANEL
Anion gap: 15 (ref 5–15)
BUN: 25 mg/dL — ABNORMAL HIGH (ref 8–23)
CO2: 35 mmol/L — ABNORMAL HIGH (ref 22–32)
Calcium: 9.6 mg/dL (ref 8.9–10.3)
Chloride: 90 mmol/L — ABNORMAL LOW (ref 98–111)
Creatinine, Ser: 1.42 mg/dL — ABNORMAL HIGH (ref 0.61–1.24)
GFR calc Af Amer: 56 mL/min — ABNORMAL LOW (ref >60)
GFR calc non Af Amer: 48 mL/min — ABNORMAL LOW (ref >60)
Glucose, Bld: 92 mg/dL (ref 70–99)
Potassium: 3.8 mmol/L (ref 3.5–5.1)
Sodium: 140 mmol/L (ref 135–145)

## 2020-07-01 LAB — MAGNESIUM: Magnesium: 2.5 mg/dL — ABNORMAL HIGH (ref 1.7–2.4)

## 2020-07-01 LAB — ALBUMIN: Albumin: 4.6 g/dL (ref 3.5–5.0)

## 2020-07-01 MED ORDER — POTASSIUM CHLORIDE CRYS ER 20 MEQ PO TBCR
40.0000 meq | EXTENDED_RELEASE_TABLET | Freq: Once | ORAL | Status: AC
  Administered 2020-07-01: 40 meq via ORAL
  Filled 2020-07-01: qty 2

## 2020-07-01 NOTE — Progress Notes (Signed)
PROGRESS NOTE    John [name]  WYO:[id number] DOB: 07-16-1946 DOA: 06/21/2020 PCP: Tracie Harrier, MD  Brief Narrative:  74 y.o.malewith medical history significant forhypertension and coronary artery disease who presented to the emergency room for evaluation of generalized body swelling but mostly in the scrotal area and lower extremity. Patient states that he has been compliant with his Lasix 80 mg daily without any improvement in the swelling. He complains of difficulty voiding due to the scrotal swelling. He denies any dietary indiscretion.  Patient was admitted for further management of anasarca.  7/14: Patient seen and examined.  Remains on Lasix infusion.  Diuresing well.  Net -10 L over admission.  Reluctant to agree to second IV placement for concomitant administration of albumin.  I spoke to patient.  He was in agreement to having 2nd IV line placed  7/15: Patient seen and examined.  Continues to diurese well.  Remains on Lasix and albumin infusion.  Net -2.3 L past 24 hours.  Net -13.4 L overall since admission  7/16: Patient seen and examined.  Continues to diuresis.  Net -1 L her past 24 hours.  Average weight loss of 1 kg daily.  Still with notable lower extremity edema, right greater than left  7/17: Patient seen and examined.  Case discussed with nephrology.  Continues to diurese well.  Net -16 L since admission.  Continues to lose weight.  Average weight loss of approximately 1 kg daily.  Lower extremity edema improving.   Assessment & Plan:   Principal Problem:   Acute CHF (congestive heart failure) (HCC) Active Problems:   Hypertension  Anasarca Acute on chronic systolic and diastolic congestive heart failure Initially did not respond to Lasix pushes Lasix GTT initiated with good result Net -1634 L over admission Case discussed with nephrology Plan: Continue Lasix GTT Continue albumin bolus Monitor urine output Daily weights Fluid restrict, 1 L  daily Anticipate patient will need  2 more days on Lasix/albumin prior to disposition planning.  Acute on chronic kidney disease stage IIIb Creatinine 1.43 today Improving over interval Daily renal function Lasix GTT as above Abdomen as above  Cholelithiasis No indication of acute cholecystitis Outpatient follow-up  Essential hypertension Patient on metoprolol  pressure well controlled over interval  History of ascending aortic aneurysm History of abdominal aortic aneurysm CT scan recommended however due to elevated creatinine this is on hold No abdominal pain noted Can likely defer to outpatient  Acute on chronic hypoxic respiratory failure History of COPD On 2 to 3 L nasal cannula Uses oxygen at home as needed Likely secondary to pulmonary vascular congestion Lasix GTT Wean on oxygen as tolerated  DVT prophylaxis: Lovenox  code Status: Full Family Communication: None today  disposition Plan: Status is: Inpatient  Remains inpatient appropriate because:Inpatient level of care appropriate due to severity of illness   Dispo:  Patient From: Home  Planned Disposition: Home  Expected discharge date: 07/03/20  Medically stable for discharge: No  Continuing on Lasix infusion.  Still with significant peripheral edema and hypoxia requiring nasal cannula.  Will likely need additional 1 to 2 days of diuresis on infusion prior to disposition planning.  Consultants:   Cardiology, [facility] clinic  Nephrology  Procedures:   none   Antimicrobials: none   Subjective: Seen and examined.  No specific complaints.  Feels well.  Objective: Vitals:   06/30/20 2223 07/01/20 0046 07/01/20 0506 07/01/20 1028  BP: 137/80 110/81 127/78 129/86  Pulse: 78 77 86 85  Resp:  20   Temp: 98 F (36.7 C) 98.1 F (36.7 C) 98.1 F (36.7 C) 98.1 F (36.7 C)  TempSrc: Oral Oral  Oral  SpO2: 94% 99% 92% 97%  Weight:   99.5 kg   Height:        Intake/Output Summary (Last 24  hours) at 07/01/2020 1239 Last data filed at 07/01/2020 1212 Gross per 24 hour  Intake 886.66 ml  Output 2935 ml  Net -2048.34 ml   Filed Weights   06/29/20 0618 06/30/20 0716 07/01/20 0506  Weight: 103.5 kg 101.5 kg 99.5 kg    Examination:  General exam: Appears calm and comfortable  Respiratory system: Scattered crackles bilaterally.  Normal work of breathing Cardiovascular system: S1-S2 heard, regular rhythm, no murmurs, tense pitting edema bilateral lower extremities  gastrointestinal system: Abdomen is nondistended, soft and nontender. No organomegaly or masses felt. Normal bowel sounds heard. Central nervous system: Alert and oriented. No focal neurological deficits. Extremities: Symmetric 5 x 5 power. Skin: No rashes, lesions or ulcers Psychiatry: Judgement and insight appear normal. Mood & affect appropriate.     Data Reviewed: I have personally reviewed following labs and imaging studies  CBC: Recent Labs  Lab 06/28/20 0431  WBC 6.4  HGB 15.4  HCT 49.6  MCV 102.3*  PLT 235*   Basic Metabolic Panel: Recent Labs  Lab 06/27/20 1239 06/28/20 0431 06/29/20 0413 06/30/20 0313 07/01/20 0310  NA 138 140 140 141 140  K 4.0 3.7 3.6 3.8 3.8  CL 88* 91* 91* 92* 90*  CO2 41* 38* 40* 40* 35*  GLUCOSE 128* 96 91 108* 92  BUN 19 18 19 22  25*  CREATININE 1.54* 1.43* 1.40* 1.42* 1.42*  CALCIUM 8.9 8.8* 9.2 9.6 9.6  MG  --   --   --  2.5* 2.5*   GFR: Estimated Creatinine Clearance: 54 mL/min (A) (by C-G formula based on SCr of 1.42 mg/dL (H)). Liver Function Tests: Recent Labs  Lab 07/01/20 0310  ALBUMIN 4.6   No results for input(s): LIPASE, AMYLASE in the last 168 hours. No results for input(s): AMMONIA in the last 168 hours. Coagulation Profile: No results for input(s): INR, PROTIME in the last 168 hours. Cardiac Enzymes: No results for input(s): CKTOTAL, CKMB, CKMBINDEX, TROPONINI in the last 168 hours. BNP (last 3 results) No results for input(s): PROBNP  in the last 8760 hours. HbA1C: No results for input(s): HGBA1C in the last 72 hours. CBG: No results for input(s): GLUCAP in the last 168 hours. Lipid Profile: No results for input(s): CHOL, HDL, LDLCALC, TRIG, CHOLHDL, LDLDIRECT in the last 72 hours. Thyroid Function Tests: No results for input(s): TSH, T4TOTAL, FREET4, T3FREE, THYROIDAB in the last 72 hours. Anemia Panel: No results for input(s): VITAMINB12, FOLATE, FERRITIN, TIBC, IRON, RETICCTPCT in the last 72 hours. Sepsis Labs: No results for input(s): PROCALCITON, LATICACIDVEN in the last 168 hours.  No results found for this or any previous visit (from the past 240 hour(s)).       Radiology Studies: No results found.      Scheduled Meds: . aspirin EC  325 mg Oral Daily  . enoxaparin (LOVENOX) injection  40 mg Subcutaneous Q24H  . metoprolol succinate  50 mg Oral Daily  . polyethylene glycol  17 g Oral Daily  . sodium chloride flush  3 mL Intravenous Q12H   Continuous Infusions: . sodium chloride    . albumin human 25 g (07/01/20 1210)  . furosemide (LASIX) infusion 8 mg/hr (06/30/20 1802)  LOS: 9 days    Time spent: 35 minutes    Sidney Ace, MD [facility]  If 7PM-7AM, please contact night-coverage 07/01/2020, 12:39 PM

## 2020-07-01 NOTE — Progress Notes (Signed)
[name]  ROUNDING NOTE   Subjective:   Seated in chair. States peripheral edema has improved. Denies any shortness of breath.  Furosemide gtt 8mg /hr  Weight 99.5kg (101.5) UOP 2559mL.   Objective:  Vital signs in last 24 hours:  Temp:  [97.8 F (36.6 C)-98.3 F (36.8 C)] 98.1 F (36.7 C) (07/17 0506) Pulse Rate:  [74-86] 86 (07/17 0506) Resp:  [18-20] 20 (07/17 0506) BP: (110-137)/(78-90) 127/78 (07/17 0506) SpO2:  [90 %-99 %] 92 % (07/17 0506) Weight:  [99.5 kg] 99.5 kg (07/17 0506)  Weight change:  Filed Weights   06/29/20 0618 06/30/20 0716 07/01/20 0506  Weight: 103.5 kg 101.5 kg 99.5 kg    Intake/Output: I/O last 3 completed shifts: In: 1077.5 [P.O.:594; I.V.:291.7; IV Piggyback:191.8] Out: 3474 [Urine:3635]   Intake/Output this shift:  No intake/output data recorded.  Physical Exam: General: Seated in chair  Head: Normocephalic, atraumatic. Moist oral mucosal membranes  Eyes: Anicteric  Neck: Supple, trachea midline  Lungs:  Clear to auscultation, 2 L [place]  Heart: regular  Abdomen:  Soft, nontender  Extremities: + peripheral edema   Neurologic: Alert and oriented  Skin: No acute rash       Basic Metabolic Panel: Recent Labs  Lab 06/27/20 1239 06/27/20 1239 06/28/20 0431 06/28/20 0431 06/29/20 0413 06/30/20 0313 07/01/20 0310  NA 138  --  140  --  140 141 140  K 4.0  --  3.7  --  3.6 3.8 3.8  CL 88*  --  91*  --  91* 92* 90*  CO2 41*  --  38*  --  40* 40* 35*  GLUCOSE 128*  --  96  --  91 108* 92  BUN 19  --  18  --  19 22 25*  CREATININE 1.54*  --  1.43*  --  1.40* 1.42* 1.42*  CALCIUM 8.9   < > 8.8*   < > 9.2 9.6 9.6  MG  --   --   --   --   --  2.5* 2.5*   < > = values in this interval not displayed.    Liver Function Tests: No results for input(s): AST, ALT, ALKPHOS, BILITOT, PROT, ALBUMIN in the last 168 hours. No results for input(s): LIPASE, AMYLASE in the last 168 hours. No results for input(s): AMMONIA in the  last 168 hours.  CBC: Recent Labs  Lab 06/28/20 0431  WBC 6.4  HGB 15.4  HCT 49.6  MCV 102.3*  PLT 133*    Cardiac Enzymes: No results for input(s): CKTOTAL, CKMB, CKMBINDEX, TROPONINI in the last 168 hours.  BNP: Invalid input(s): POCBNP  CBG: No results for input(s): GLUCAP in the last 168 hours.  Microbiology: Results for orders placed or performed during the hospital encounter of 06/21/20  SARS Coronavirus 2 by RT PCR (hospital order, performed in [facility] hospital lab) Nasopharyngeal Nasopharyngeal Swab     Status: None   Collection Time: 06/21/20 10:24 AM   Specimen: Nasopharyngeal Swab  Result Value Ref Range Status   SARS Coronavirus 2 NEGATIVE NEGATIVE Final    Comment: (NOTE) SARS-CoV-2 target nucleic acids are NOT DETECTED.  The SARS-CoV-2 RNA is generally detectable in upper and lower respiratory specimens during the acute phase of infection. The lowest concentration of SARS-CoV-2 viral copies this assay can detect is 250 copies / mL. A negative result does not preclude SARS-CoV-2 infection and should not be used as the sole basis for treatment or other patient management decisions.  A negative result may occur with improper specimen collection / handling, submission of specimen other than nasopharyngeal swab, presence of viral mutation(s) within the areas targeted by this assay, and inadequate number of viral copies (<250 copies / mL). A negative result must be combined with clinical observations, patient history, and epidemiological information.  Fact Sheet for Patients:   StrictlyIdeas.no  Fact Sheet for Healthcare Providers: BankingDealers.co.za  This test is not yet approved or  cleared by the [place] FDA and has been authorized for detection and/or diagnosis of SARS-CoV-2 by FDA under an Emergency Use Authorization (EUA).  This EUA will remain in effect (meaning this test can be used) for the  duration of the COVID-19 declaration under Section 564(b)(1) of the Act, 21 U.S.C. section 360bbb-3(b)(1), unless the authorization is terminated or revoked sooner.  Performed at [facility], [address] [place], [place] [address]     Coagulation Studies: No results for input(s): LABPROT, INR in the last 72 hours.  Urinalysis: No results for input(s): COLORURINE, LABSPEC, PHURINE, GLUCOSEU, HGBUR, BILIRUBINUR, KETONESUR, PROTEINUR, UROBILINOGEN, NITRITE, LEUKOCYTESUR in the last 72 hours.  Invalid input(s): APPERANCEUR    Imaging: No results found.   Medications:   . sodium chloride    . albumin human Stopped (06/30/20 2328)  . furosemide (LASIX) infusion 8 mg/hr (06/30/20 1802)   . aspirin EC  325 mg Oral Daily  . enoxaparin (LOVENOX) injection  40 mg Subcutaneous Q24H  . metoprolol succinate  50 mg Oral Daily  . polyethylene glycol  17 g Oral Daily  . potassium chloride  40 mEq Oral Once  . sodium chloride flush  3 mL Intravenous Q12H   sodium chloride, acetaminophen, ondansetron (ZOFRAN) IV, sodium chloride flush  Assessment/ Plan:   [name] is a 74 y.o. black male with hypertension, coronary artery disease who is admitted to [facility] on 06/21/2020 for Swelling [R60.9] Scrotal swelling [N50.89] Acute CHF (congestive heart failure) ([place]) [I50.9] Acute respiratory failure with hypoxia ([place]) [J96.01] AKI (acute kidney injury) ([place]) [N17.9] Acute on chronic congestive heart failure, unspecified heart failure type ([place]) [I50.9]  1. Acute renal failure on chronic kidney disease stage IIIA. Creatinine back to baseline. Creatinine 1.45, GFR of 47 on 08/31/2020. Bland urine.  Chronic kidney disease secondary to hypertension.  Acute renal failure secondary to cardiorenal failure.   2. Hypertension with acute exacerbation of systolic congestive heart failure and anasarca.    LOS: 9 John [name] 7/17/20219:38 AM

## 2020-07-02 DIAGNOSIS — I10 Essential (primary) hypertension: Secondary | ICD-10-CM | POA: Diagnosis not present

## 2020-07-02 DIAGNOSIS — I5041 Acute combined systolic (congestive) and diastolic (congestive) heart failure: Secondary | ICD-10-CM | POA: Diagnosis not present

## 2020-07-02 LAB — BASIC METABOLIC PANEL
Anion gap: 12 (ref 5–15)
BUN: 27 mg/dL — ABNORMAL HIGH (ref 8–23)
CO2: 34 mmol/L — ABNORMAL HIGH (ref 22–32)
Calcium: 9.7 mg/dL (ref 8.9–10.3)
Chloride: 94 mmol/L — ABNORMAL LOW (ref 98–111)
Creatinine, Ser: 1.39 mg/dL — ABNORMAL HIGH (ref 0.61–1.24)
GFR calc Af Amer: 57 mL/min — ABNORMAL LOW (ref >60)
GFR calc non Af Amer: 50 mL/min — ABNORMAL LOW (ref >60)
Glucose, Bld: 105 mg/dL — ABNORMAL HIGH (ref 70–99)
Potassium: 3.7 mmol/L (ref 3.5–5.1)
Sodium: 140 mmol/L (ref 135–145)

## 2020-07-02 LAB — MAGNESIUM: Magnesium: 2.5 mg/dL — ABNORMAL HIGH (ref 1.7–2.4)

## 2020-07-02 NOTE — Progress Notes (Signed)
[name]  ROUNDING NOTE   Subjective:   Furosemide gtt 8mg /hr  UOP 165mL.   Objective:  Vital signs in last 24 hours:  Temp:  [97.6 F (36.4 C)-98.6 F (37 C)] 98.1 F (36.7 C) (07/18 0730) Pulse Rate:  [75-87] 80 (07/18 0730) Resp:  [16-21] 21 (07/18 0730) BP: (125-132)/(84-90) 125/85 (07/18 0730) SpO2:  [92 %-100 %] 96 % (07/18 0730) Weight:  [99.2 kg] 99.2 kg (07/18 0422)  Weight change: -2.31 kg Filed Weights   06/30/20 0716 07/01/20 0506 07/02/20 0422  Weight: 101.5 kg 99.5 kg 99.2 kg    Intake/Output: I/O last 3 completed shifts: In: 1544.2 [P.O.:958; I.V.:286.2; IV Piggyback:300] Out: 0626 [Urine:3285]   Intake/Output this shift:  Total I/O In: 123 [P.O.:120; I.V.:3] Out: 700 [Urine:700]  Physical Exam: General: Seated in chair  Head: Normocephalic, atraumatic. Moist oral mucosal membranes  Eyes: Anicteric  Neck: Supple, trachea midline  Lungs:  Clear to auscultation, 2 L [place]  Heart: regular  Abdomen:  Soft, nontender  Extremities: + peripheral edema   Neurologic: Alert and oriented  Skin: No acute rash       Basic Metabolic Panel: Recent Labs  Lab 06/28/20 0431 06/28/20 0431 06/29/20 0413 06/29/20 0413 06/30/20 0313 07/01/20 0310 07/02/20 0420  NA 140  --  140  --  141 140 140  K 3.7  --  3.6  --  3.8 3.8 3.7  CL 91*  --  91*  --  92* 90* 94*  CO2 38*  --  40*  --  40* 35* 34*  GLUCOSE 96  --  91  --  108* 92 105*  BUN 18  --  19  --  22 25* 27*  CREATININE 1.43*  --  1.40*  --  1.42* 1.42* 1.39*  CALCIUM 8.8*   < > 9.2   < > 9.6 9.6 9.7  MG  --   --   --   --  2.5* 2.5* 2.5*   < > = values in this interval not displayed.    Liver Function Tests: Recent Labs  Lab 07/01/20 0310  ALBUMIN 4.6   No results for input(s): LIPASE, AMYLASE in the last 168 hours. No results for input(s): AMMONIA in the last 168 hours.  CBC: Recent Labs  Lab 06/28/20 0431  WBC 6.4  HGB 15.4  HCT 49.6  MCV 102.3*  PLT 133*     Cardiac Enzymes: No results for input(s): CKTOTAL, CKMB, CKMBINDEX, TROPONINI in the last 168 hours.  BNP: Invalid input(s): POCBNP  CBG: No results for input(s): GLUCAP in the last 168 hours.  Microbiology: Results for orders placed or performed during the hospital encounter of 06/21/20  SARS Coronavirus 2 by RT PCR (hospital order, performed in [facility] hospital lab) Nasopharyngeal Nasopharyngeal Swab     Status: None   Collection Time: 06/21/20 10:24 AM   Specimen: Nasopharyngeal Swab  Result Value Ref Range Status   SARS Coronavirus 2 NEGATIVE NEGATIVE Final    Comment: (NOTE) SARS-CoV-2 target nucleic acids are NOT DETECTED.  The SARS-CoV-2 RNA is generally detectable in upper and lower respiratory specimens during the acute phase of infection. The lowest concentration of SARS-CoV-2 viral copies this assay can detect is 250 copies / mL. A negative result does not preclude SARS-CoV-2 infection and should not be used as the sole basis for treatment or other patient management decisions.  A negative result may occur with improper specimen collection / handling, submission of specimen other than  nasopharyngeal swab, presence of viral mutation(s) within the areas targeted by this assay, and inadequate number of viral copies (<250 copies / mL). A negative result must be combined with clinical observations, patient history, and epidemiological information.  Fact Sheet for Patients:   StrictlyIdeas.no  Fact Sheet for Healthcare Providers: BankingDealers.co.za  This test is not yet approved or  cleared by the [place] FDA and has been authorized for detection and/or diagnosis of SARS-CoV-2 by FDA under an Emergency Use Authorization (EUA).  This EUA will remain in effect (meaning this test can be used) for the duration of the COVID-19 declaration under Section 564(b)(1) of the Act, 21 U.S.C. section 360bbb-3(b)(1),  unless the authorization is terminated or revoked sooner.  Performed at [facility], [address] [place], [place] [address]     Coagulation Studies: No results for input(s): LABPROT, INR in the last 72 hours.  Urinalysis: No results for input(s): COLORURINE, LABSPEC, PHURINE, GLUCOSEU, HGBUR, BILIRUBINUR, KETONESUR, PROTEINUR, UROBILINOGEN, NITRITE, LEUKOCYTESUR in the last 72 hours.  Invalid input(s): APPERANCEUR    Imaging: No results found.   Medications:   . sodium chloride 250 mL (07/01/20 2333)  . furosemide (LASIX) infusion 8 mg/hr (07/02/20 0115)   . aspirin EC  325 mg Oral Daily  . enoxaparin (LOVENOX) injection  40 mg Subcutaneous Q24H  . metoprolol succinate  50 mg Oral Daily  . polyethylene glycol  17 g Oral Daily  . sodium chloride flush  3 mL Intravenous Q12H   sodium chloride, acetaminophen, ondansetron (ZOFRAN) IV, sodium chloride flush  Assessment/ Plan:   [name] is a 74 y.o. black male with hypertension, coronary artery disease who is admitted to [facility] on 06/21/2020 for Swelling [R60.9] Scrotal swelling [N50.89] Acute CHF (congestive heart failure) ([place]) [I50.9] Acute respiratory failure with hypoxia ([place]) [J96.01] AKI (acute kidney injury) ([place]) [N17.9] Acute on chronic congestive heart failure, unspecified heart failure type ([place]) [I50.9]  1. Acute renal failure on chronic kidney disease stage IIIA. Creatinine back to baseline. Creatinine 1.45, GFR of 47 on 08/31/2020. Bland urine.  Chronic kidney disease secondary to hypertension.  Acute renal failure secondary to cardiorenal failure.   2. Hypertension with acute exacerbation of systolic congestive heart failure and anasarca.   - Continue furosemide gtt for one more day.    LOS: 10 John [name] 7/18/202111:11 AM

## 2020-07-02 NOTE — Progress Notes (Signed)
PROGRESS NOTE    John [name]  WUJ:[id number] DOB: 1946-01-21 DOA: 06/21/2020 PCP: Tracie Harrier, MD  Brief Narrative:  74 y.o.malewith medical history significant forhypertension and coronary artery disease who presented to the emergency room for evaluation of generalized body swelling but mostly in the scrotal area and lower extremity. Patient states that he has been compliant with his Lasix 80 mg daily without any improvement in the swelling. He complains of difficulty voiding due to the scrotal swelling. He denies any dietary indiscretion.  Patient was admitted for further management of anasarca.  7/14: Patient seen and examined.  Remains on Lasix infusion.  Diuresing well.  Net -10 L over admission.  Reluctant to agree to second IV placement for concomitant administration of albumin.  I spoke to patient.  He was in agreement to having 2nd IV line placed  7/15: Patient seen and examined.  Continues to diurese well.  Remains on Lasix and albumin infusion.  Net -2.3 L past 24 hours.  Net -13.4 L overall since admission  7/16: Patient seen and examined.  Continues to diuresis.  Net -1 L her past 24 hours.  Average weight loss of 1 kg daily.  Still with notable lower extremity edema, right greater than left  7/17: Patient seen and examined.  Case discussed with nephrology.  Continues to diurese well.  Net -16 L since admission.  Continues to lose weight.  Average weight loss of approximately 1 kg daily.  Lower extremity edema improving.  7/18: Patient seen and examined.  Report is 0.3 kg weight loss over the past 24 hours.  Net -17.3 L since admission.   Assessment & Plan:   Principal Problem:   Acute CHF (congestive heart failure) (HCC) Active Problems:   Hypertension  Anasarca Acute on chronic systolic and diastolic congestive heart failure Initially did not respond to Lasix pushes Lasix GTT initiated with good result Net -17.3 L over admission Case discussed with  nephrology Plan: Continue Lasix GTT Dc albumin bolus Monitor urine output Daily weights Fluid restrict, 1 L daily Anticipate patient will need  1 more day on Lasix prior to disposition planning.  Acute on chronic kidney disease stage IIIb Creatinine 1.39 today Improving over interval Daily renal function Lasix GTT as above   Cholelithiasis No indication of acute cholecystitis Outpatient follow-up  Essential hypertension Patient on metoprolol  pressure well controlled over interval  History of ascending aortic aneurysm History of abdominal aortic aneurysm CT scan recommended however due to elevated creatinine this is on hold No abdominal pain noted Can likely defer to outpatient  Acute on chronic hypoxic respiratory failure History of COPD On 2 to 3 L nasal cannula Uses oxygen at home as needed Likely secondary to pulmonary vascular congestion Lasix GTT Wean on oxygen as tolerated  DVT prophylaxis: Lovenox  code Status: Full Family Communication: None today  disposition Plan: Status is: Inpatient  Remains inpatient appropriate because:Inpatient level of care appropriate due to severity of illness   Dispo:  Patient From: Home  Planned Disposition: Home  Expected discharge date: 07/03/20  Medically stable for discharge: No  Continuing on Lasix infusion.  Peripheral edema improving.  Can likely transition to oral Lasix within the next 24 hours.  Defer to nephrology. Consultants:   Cardiology, [facility]  Nephrology  Procedures:   none   Antimicrobials: none   Subjective: Seen and examined.  No specific complaints.  Feels well.  Edema improving  Objective: Vitals:   07/01/20 1539 07/01/20 1956 07/02/20 0422 07/02/20  0730  BP: 132/84 127/87 130/90 125/85  Pulse: 75 79 87 80  Resp: 20 18 16  (!) 21  Temp: 97.7 F (36.5 C) 97.6 F (36.4 C) 98.6 F (37 C) 98.1 F (36.7 C)  TempSrc: Oral  Oral   SpO2: 100% 93% 92% 96%  Weight:   99.2 kg    Height:        Intake/Output Summary (Last 24 hours) at 07/02/2020 1035 Last data filed at 07/02/2020 1003 Gross per 24 hour  Intake 1115.71 ml  Output 2000 ml  Net -884.29 ml   Filed Weights   06/30/20 0716 07/01/20 0506 07/02/20 0422  Weight: 101.5 kg 99.5 kg 99.2 kg    Examination:  General exam: Appears calm and comfortable  Respiratory system: Scattered crackles bilaterally.  Normal work of breathing Cardiovascular system: S1-S2 heard, regular rhythm, no murmurs, tense pitting edema bilateral lower extremities  gastrointestinal system: Abdomen is nondistended, soft and nontender. No organomegaly or masses felt. Normal bowel sounds heard. Central nervous system: Alert and oriented. No focal neurological deficits. Extremities: Symmetric 5 x 5 power. Skin: No rashes, lesions or ulcers Psychiatry: Judgement and insight appear normal. Mood & affect appropriate.     Data Reviewed: I have personally reviewed following labs and imaging studies  CBC: Recent Labs  Lab 06/28/20 0431  WBC 6.4  HGB 15.4  HCT 49.6  MCV 102.3*  PLT 161*   Basic Metabolic Panel: Recent Labs  Lab 06/28/20 0431 06/29/20 0413 06/30/20 0313 07/01/20 0310 07/02/20 0420  NA 140 140 141 140 140  K 3.7 3.6 3.8 3.8 3.7  CL 91* 91* 92* 90* 94*  CO2 38* 40* 40* 35* 34*  GLUCOSE 96 91 108* 92 105*  BUN 18 19 22  25* 27*  CREATININE 1.43* 1.40* 1.42* 1.42* 1.39*  CALCIUM 8.8* 9.2 9.6 9.6 9.7  MG  --   --  2.5* 2.5* 2.5*   GFR: Estimated Creatinine Clearance: 55.1 mL/min (A) (by C-G formula based on SCr of 1.39 mg/dL (H)). Liver Function Tests: Recent Labs  Lab 07/01/20 0310  ALBUMIN 4.6   No results for input(s): LIPASE, AMYLASE in the last 168 hours. No results for input(s): AMMONIA in the last 168 hours. Coagulation Profile: No results for input(s): INR, PROTIME in the last 168 hours. Cardiac Enzymes: No results for input(s): CKTOTAL, CKMB, CKMBINDEX, TROPONINI in the last 168  hours. BNP (last 3 results) No results for input(s): PROBNP in the last 8760 hours. HbA1C: No results for input(s): HGBA1C in the last 72 hours. CBG: No results for input(s): GLUCAP in the last 168 hours. Lipid Profile: No results for input(s): CHOL, HDL, LDLCALC, TRIG, CHOLHDL, LDLDIRECT in the last 72 hours. Thyroid Function Tests: No results for input(s): TSH, T4TOTAL, FREET4, T3FREE, THYROIDAB in the last 72 hours. Anemia Panel: No results for input(s): VITAMINB12, FOLATE, FERRITIN, TIBC, IRON, RETICCTPCT in the last 72 hours. Sepsis Labs: No results for input(s): PROCALCITON, LATICACIDVEN in the last 168 hours.  No results found for this or any previous visit (from the past 240 hour(s)).       Radiology Studies: No results found.      Scheduled Meds: . aspirin EC  325 mg Oral Daily  . enoxaparin (LOVENOX) injection  40 mg Subcutaneous Q24H  . metoprolol succinate  50 mg Oral Daily  . polyethylene glycol  17 g Oral Daily  . sodium chloride flush  3 mL Intravenous Q12H   Continuous Infusions: . sodium chloride 250 mL (07/01/20 2333)  .  furosemide (LASIX) infusion 8 mg/hr (07/02/20 0115)     LOS: 10 days    Time spent: 35 minutes    Sidney Ace, MD [facility]  If 7PM-7AM, please contact night-coverage 07/02/2020, 10:35 AM

## 2020-07-02 NOTE — Plan of Care (Signed)
°  Problem: Health Behavior/Discharge Planning: Goal: Ability to manage health-related needs will improve Outcome: Progressing   Problem: Elimination: Goal: Will not experience complications related to urinary retention Outcome: Progressing   Problem: Education: Goal: Ability to demonstrate management of disease process will improve Outcome: Progressing   Problem: Activity: Goal: Capacity to carry out activities will improve Outcome: Progressing

## 2020-07-03 DIAGNOSIS — I5041 Acute combined systolic (congestive) and diastolic (congestive) heart failure: Secondary | ICD-10-CM | POA: Diagnosis not present

## 2020-07-03 DIAGNOSIS — I10 Essential (primary) hypertension: Secondary | ICD-10-CM | POA: Diagnosis not present

## 2020-07-03 LAB — BASIC METABOLIC PANEL
Anion gap: 13 (ref 5–15)
BUN: 29 mg/dL — ABNORMAL HIGH (ref 8–23)
CO2: 35 mmol/L — ABNORMAL HIGH (ref 22–32)
Calcium: 9.6 mg/dL (ref 8.9–10.3)
Chloride: 91 mmol/L — ABNORMAL LOW (ref 98–111)
Creatinine, Ser: 1.57 mg/dL — ABNORMAL HIGH (ref 0.61–1.24)
GFR calc Af Amer: 50 mL/min — ABNORMAL LOW (ref >60)
GFR calc non Af Amer: 43 mL/min — ABNORMAL LOW (ref >60)
Glucose, Bld: 74 mg/dL (ref 70–99)
Potassium: 4.1 mmol/L (ref 3.5–5.1)
Sodium: 139 mmol/L (ref 135–145)

## 2020-07-03 LAB — MAGNESIUM: Magnesium: 2.5 mg/dL — ABNORMAL HIGH (ref 1.7–2.4)

## 2020-07-03 MED ORDER — TORSEMIDE 20 MG PO TABS
100.0000 mg | ORAL_TABLET | Freq: Every day | ORAL | Status: DC
  Administered 2020-07-03: 100 mg via ORAL
  Filled 2020-07-03: qty 5

## 2020-07-03 MED ORDER — LOSARTAN POTASSIUM 50 MG PO TABS
100.0000 mg | ORAL_TABLET | Freq: Every day | ORAL | Status: DC
  Administered 2020-07-03: 100 mg via ORAL
  Filled 2020-07-03: qty 2

## 2020-07-03 MED ORDER — TORSEMIDE 100 MG PO TABS: 100.0000 mg | ORAL_TABLET | Freq: Every day | ORAL | 0 refills | Status: DC

## 2020-07-03 NOTE — Discharge Summary (Signed)
Physician Discharge Summary  John [name] DUK:[id number] DOB: 08/22/1946 DOA: 06/21/2020  PCP: Tracie Harrier, MD  Admit date: 06/21/2020 Discharge date: 07/03/2020  Admitted From: Home Disposition: Home  Recommendations for Outpatient Follow-up:  1. Follow up with PCP in 1-2 weeks 2. Follow-up with nephrology within a 4 to 5 days  Home Health: No Equipment/Devices: None Discharge Condition: Stable CODE STATUS: Full Diet recommendation: Heart Healthy / Carb Modified Brief/Interim Summary: 74 y.o.malewith medical history significant forhypertension and coronary artery disease who presented to the emergency room for evaluation of generalized body swelling but mostly in the scrotal area and lower extremity. Patient states that he has been compliant with his Lasix 80 mg daily without any improvement in the swelling. He complains of difficulty voiding due to the scrotal swelling. He denies any dietary indiscretion. Patient was admitted for further management of anasarca.  7/14: Patient seen and examined.  Remains on Lasix infusion.  Diuresing well.  Net -10 L over admission.  Reluctant to agree to second IV placement for concomitant administration of albumin.  I spoke to patient.  He was in agreement to having 2nd IV line placed  7/15: Patient seen and examined.  Continues to diurese well.  Remains on Lasix and albumin infusion.  Net -2.3 L past 24 hours.  Net -13.4 L overall since admission  7/16: Patient seen and examined.  Continues to diuresis.  Net -1 L her past 24 hours.  Average weight loss of 1 kg daily.  Still with notable lower extremity edema, right greater than left  7/17: Patient seen and examined.  Case discussed with nephrology.  Continues to diurese well.  Net -16 L since admission.  Continues to lose weight.  Average weight loss of approximately 1 kg daily.  Lower extremity edema improving.  7/18: Patient seen and examined.  Report is 0.3 kg weight loss over the  past 24 hours.  Net -17.3 L since admission.  7/19: Patient seen and examined.  Stable off of drip.  Net -18.2 L since admission.  Communicated with nephrology.  Okay for discharge at this time.  Follow-up with nephrology clinic in 4 to 5 days.   Discharge Diagnoses:  Principal Problem:   Acute CHF (congestive heart failure) (HCC) Active Problems:   Hypertension Anasarca Acute on chronic systolic and diastolic congestive heart failure Initially did not respond to Lasix pushes Lasix GTT initiated with good result Net -18.2 L over admission Case discussed with nephrology No further need for Lasix drip DC home Lasix 80 mg Initiate torsemide 100 mg daily Follow-up with nephrology on discharge Could not initiate beta-blocker at this time due to hypotension Follow-up with cardiology to discuss initiation of beta-blocker later date  Acute on chronic kidney disease stage IIIb Creatinine proving at time of discharge Torsemide 100 mg daily   Cholelithiasis No indication of acute cholecystitis Outpatient follow-up  Essential hypertension Restarted ACE inhibitor on discharge Could not start beta-blocker due to relative hypotension Follow-up with cardiology discussed starting this medication  History of ascending aortic aneurysm History of abdominal aortic aneurysm CT scan recommended however due to elevated creatinine this is on hold No abdominal pain noted Can likely defer to outpatient  Acute on chronic hypoxic respiratory failure History of COPD On 2 to 3 L nasal cannula Uses oxygen at home as needed Likely secondary to pulmonary vascular congestion Lasix GTT Wean on oxygen as tolerated   Discharge Instructions  Discharge Instructions    Diet - low sodium heart healthy   Complete  by: As directed    Increase activity slowly   Complete by: As directed      Allergies as of 07/03/2020   No Known Allergies     Medication List    STOP taking these medications    furosemide 80 MG tablet Commonly known as: LASIX     TAKE these medications   aspirin 81 MG chewable tablet Chew 81 mg by mouth daily.   aspirin EC 325 MG tablet Take 1 tablet (325 mg total) by mouth daily.   losartan 100 MG tablet Commonly known as: COZAAR Take 100 mg by mouth daily.   oxyCODONE 5 MG immediate release tablet Commonly known as: Oxy IR/ROXICODONE Take 1-2 tablets (5-10 mg total) by mouth every 4 (four) hours as needed for severe pain (pain score 7-10).   torsemide 100 MG tablet Commonly known as: DEMADEX Take 1 tablet (100 mg total) by mouth daily.       Follow-up Information    [facility] Follow up on 07/20/2020.   Specialty: Cardiology Why: at 9:30am. Enter through the [place] entrance Contact information: [place] [phone]       Corey Skains, MD. Go on 07/07/2020.   Specialty: Cardiology Why: Appointment at 1:15pm Contact information: [address] [place] [facility] [place] [address] [phone]              No Known Allergies  Consultations:  Cardiology-[facility]  Nephrology- [name]   Procedures/Studies: US Abdomen Complete  Result Date: 06/23/2020 CLINICAL DATA:  Hyperbilirubinemia, acute renal failure EXAM: ABDOMEN ULTRASOUND COMPLETE COMPARISON:  None. FINDINGS: Gallbladder: There is layering sludge within the gallbladder, however, the gallbladder is not distended, there is no gallbladder wall thickening, and no pericholecystic fluid is identified. The sonographic Percell Miller sign is reportedly negative. Common bile duct: Diameter: 5 mm in proximal diameter. The distal duct is obscured by overlying bowel gas. Liver: No focal lesion identified. Within normal limits in parenchymal echogenicity. Portal vein is patent on color Doppler imaging with normal direction of blood flow  towards the liver. IVC: No abnormality visualized. Pancreas: Visualized portion unremarkable. Spleen: Size and appearance within normal limits. Right Kidney: Length: 9.9 cm (see image 28). Renal cortical thickness is preserved. Cortical echogenicity, however, is diffusely increased in keeping with changes of underlying medical renal disease. There is no hydronephrosis. No intrarenal mass or calcification is identified. Left Kidney: Length: 10.8 cm. Renal cortical thickness is preserved. Cortical echogenicity, however, is diffusely increased in keeping with changes of underlying medical renal disease. There is no hydronephrosis. No intrarenal mass or calcification is identified. Abdominal aorta: A 3.2 cm aneurysm is seen of the mid abdominal aorta. The distal aorta is obscured by overlying bowel gas. Other findings: No ascites IMPRESSION: Cholelithiasis without sonographic evidence of acute cholecystitis. No intra or extrahepatic biliary ductal dilation. Diffusely increased renal cortical echogenicity suggesting changes of underlying medical renal disease. Incomplete evaluation of the abdominal aorta demonstrating a minimum 3.2 cm juxtarenal/infrarenal abdominal aortic aneurysm. This would be better assessed with dedicated CT imaging. Electronically Signed   By: Fidela Salisbury MD   On: 06/23/2020 08:38   US Venous Img Lower Bilateral (DVT)  Result Date: 06/14/2020 CLINICAL DATA:  Bilateral lower extremity swelling EXAM: BILATERAL LOWER EXTREMITY VENOUS DOPPLER ULTRASOUND TECHNIQUE: Gray-scale sonography with graded compression, as well as color Doppler and duplex ultrasound were performed to evaluate the lower extremity deep venous  systems from the level of the common femoral vein and including the common femoral, femoral, profunda femoral, popliteal and calf veins including the posterior tibial, peroneal and gastrocnemius veins when visible. The superficial great saphenous vein was also interrogated. Spectral  Doppler was utilized to evaluate flow at rest and with distal augmentation maneuvers in the common femoral, femoral and popliteal veins. COMPARISON:  None. FINDINGS: RIGHT LOWER EXTREMITY Common Femoral Vein: No evidence of thrombus. Normal compressibility, respiratory phasicity and response to augmentation. Saphenofemoral Junction: No evidence of thrombus. Normal compressibility and flow on color Doppler imaging. Profunda Femoral Vein: No evidence of thrombus. Normal compressibility and flow on color Doppler imaging. Femoral Vein: No evidence of thrombus. Normal compressibility, respiratory phasicity and response to augmentation. Popliteal Vein: No evidence of thrombus. Normal compressibility, respiratory phasicity and response to augmentation. Calf Veins: Right calf veins are poorly visualized secondary to edema. No gross thrombus evident. LEFT LOWER EXTREMITY Common Femoral Vein: No evidence of thrombus. Normal compressibility, respiratory phasicity and response to augmentation. Saphenofemoral Junction: No evidence of thrombus. Normal compressibility and flow on color Doppler imaging. Profunda Femoral Vein: No evidence of thrombus. Normal compressibility and flow on color Doppler imaging. Femoral Vein: No evidence of thrombus. Normal compressibility, respiratory phasicity and response to augmentation. Popliteal Vein: No evidence of thrombus. Normal compressibility, respiratory phasicity and response to augmentation. Calf Veins: No evidence of thrombus. Normal compressibility and flow on color Doppler imaging. IMPRESSION: No significant DVT in either extremity. Limited assessment of the right calf veins. Electronically Signed   By: Jerilynn Mages.  Shick M.D.   On: 06/14/2020 12:52   DG Chest Portable 1 View  Result Date: 06/21/2020 CLINICAL DATA:  Body swelling for awhile, scrotal swelling for 2 days, hypertension EXAM: PORTABLE CHEST 1 VIEW COMPARISON:  Portable exam 0944 hours compared to 02/16/2020 FINDINGS: Enlargement  of cardiac silhouette with pulmonary vascular congestion. Mediastinal contours normal with mild atherosclerotic calcification aorta. RIGHT basilar atelectasis. Remaining lungs clear. No pleural effusion or pneumothorax. IMPRESSION: Enlargement of cardiac silhouette with pulmonary vascular congestion. RIGHT basilar atelectasis. Electronically Signed   By: Lavonia Dana M.D.   On: 06/21/2020 10:00   ECHOCARDIOGRAM COMPLETE  Result Date: 06/22/2020    ECHOCARDIOGRAM REPORT   Patient Name:   Tiquan Artiaga Date of Exam: 06/22/2020 Medical Rec #:  [id number]      Height:       70.0 in Accession #:    [id number]     Weight:       245.2 lb Date of Birth:  1946-01-12     BSA:          2.276 m Patient Age:    52 years       BP:           140/104 mmHg Patient Gender: M              HR:           82 bpm. Exam Location:  [facility] Procedure: 2D Echo, Cardiac Doppler and Color Doppler Indications:     CHF- acute diastolic 099.83  History:         Patient has no prior history of Echocardiogram examinations.                  Risk Factors:Hypertension.  Sonographer:     Sherrie Sport RDCS (AE) Referring Phys:  [place] Diagnosing Phys: Serafina Royals MD IMPRESSIONS  1. Left ventricular ejection fraction, by estimation, is 45 to 50%. The left ventricle  has mildly decreased function. The left ventricle has no regional wall motion abnormalities. Left ventricular diastolic parameters were normal.  2. Right ventricular systolic function is normal. The right ventricular size is normal. There is normal pulmonary artery systolic pressure.  3. The mitral valve is normal in structure. Trivial mitral valve regurgitation.  4. The aortic valve is normal in structure. Aortic valve regurgitation is not visualized. FINDINGS  Left Ventricle: Left ventricular ejection fraction, by estimation, is 45 to 50%. The left ventricle has mildly decreased function. The left ventricle has no regional wall motion abnormalities. The left ventricular  internal cavity size was normal in size. There is no left ventricular hypertrophy. Left ventricular diastolic parameters were normal. Right Ventricle: The right ventricular size is normal. No increase in right ventricular wall thickness. Right ventricular systolic function is normal. There is normal pulmonary artery systolic pressure. The tricuspid regurgitant velocity is 2.13 m/s, and  with an assumed right atrial pressure of 10 mmHg, the estimated right ventricular systolic pressure is 03.7 mmHg. Left Atrium: Left atrial size was normal in size. Right Atrium: Right atrial size was normal in size. Pericardium: There is no evidence of pericardial effusion. Mitral Valve: The mitral valve is normal in structure. Trivial mitral valve regurgitation. Tricuspid Valve: The tricuspid valve is normal in structure. Tricuspid valve regurgitation is trivial. Aortic Valve: The aortic valve is normal in structure. Aortic valve regurgitation is not visualized. Aortic valve mean gradient measures 3.0 mmHg. Aortic valve peak gradient measures 5.1 mmHg. Aortic valve area, by VTI measures 3.18 cm. Pulmonic Valve: The pulmonic valve was normal in structure. Pulmonic valve regurgitation is trivial. Aorta: The aortic root and ascending aorta are structurally normal, with no evidence of dilitation. IAS/Shunts: No atrial level shunt detected by color flow Doppler.  LEFT VENTRICLE PLAX 2D LVIDd:         4.96 cm  Diastology LVIDs:         3.98 cm  LV e' lateral:   5.87 cm/s LV PW:         1.28 cm  LV E/e' lateral: 9.3 LV IVS:        0.98 cm  LV e' medial:    6.20 cm/s LVOT diam:     2.10 cm  LV E/e' medial:  8.8 LV SV:         47 LV SV Index:   21 LVOT Area:     3.46 cm  RIGHT VENTRICLE RV Basal diam:  5.45 cm RV S prime:     14.70 cm/s TAPSE (M-mode): 3.8 cm LEFT ATRIUM              Index       RIGHT ATRIUM           Index LA diam:        3.30 cm  1.45 cm/m  RA Area:     25.00 cm LA Vol (A2C):   113.0 ml 49.65 ml/m RA Volume:   91.60  ml  40.24 ml/m LA Vol (A4C):   45.0 ml  19.77 ml/m LA Biplane Vol: 74.8 ml  32.86 ml/m  AORTIC VALVE                   PULMONIC VALVE AV Area (Vmax):    2.59 cm    PV Vmax:        0.69 m/s AV Area (Vmean):   2.58 cm    PV Peak grad:   1.9 mmHg AV Area (VTI):  3.18 cm    RVOT Peak grad: 2 mmHg AV Vmax:           112.50 cm/s AV Vmean:          80.800 cm/s AV VTI:            0.149 m AV Peak Grad:      5.1 mmHg AV Mean Grad:      3.0 mmHg LVOT Vmax:         84.20 cm/s LVOT Vmean:        60.300 cm/s LVOT VTI:          0.137 m LVOT/AV VTI ratio: 0.92  AORTA Ao Root diam: 4.10 cm MITRAL VALVE               TRICUSPID VALVE MV Area (PHT): 2.91 cm    TR Peak grad:   18.1 mmHg MV Decel Time: 261 msec    TR Vmax:        213.00 cm/s MV E velocity: 54.40 cm/s MV A velocity: 77.60 cm/s  SHUNTS MV E/A ratio:  0.70        Systemic VTI:  0.14 m                            Systemic Diam: 2.10 cm Serafina Royals MD Electronically signed by Serafina Royals MD Signature Date/Time: 06/22/2020/4:58:23 PM    Final    US SCROTUM W/DOPPLER  Result Date: 06/21/2020 CLINICAL DATA:  Scrotal swelling for 2 months. EXAM: SCROTAL ULTRASOUND DOPPLER ULTRASOUND OF THE TESTICLES TECHNIQUE: Complete ultrasound examination of the testicles, epididymis, and other scrotal structures was performed. Color and spectral Doppler ultrasound were also utilized to evaluate blood flow to the testicles. COMPARISON:  None. FINDINGS: Right testicle Measurements: 3.4 x 2.8 x 2.5 cm. No mass or microlithiasis visualized. Left testicle Measurements: 3.3 x 2.7 x 2.6 cm. No mass or microlithiasis visualized. Right epididymis:  Normal in size and appearance. Left epididymis:  Normal in size and appearance. Hydrocele:  Small bilateral hydroceles are noted. Varicocele:  Small left varicocele may be present. Pulsed Doppler interrogation of both testes demonstrates normal low resistance arterial and venous waveforms bilaterally. Bilateral scrotal wall swelling is  noted. IMPRESSION: Bilateral scrotal wall swelling is noted concerning for inflammation or edema. No definite evidence of testicular mass or torsion is noted. Small bilateral hydroceles are noted. Electronically Signed   By: Marijo Conception M.D.   On: 06/21/2020 10:44    (Echo, Carotid, EGD, Colonoscopy, ERCP)    Subjective:   Discharge Exam: Vitals:   07/03/20 0741 07/03/20 1016  BP: 110/85 112/79  Pulse: 72 78  Resp: 18   Temp: 97.8 F (36.6 C)   SpO2: 96%    Vitals:   07/02/20 1952 07/03/20 0439 07/03/20 0741 07/03/20 1016  BP: 107/82 115/70 110/85 112/79  Pulse: 76 81 72 78  Resp:   18   Temp: 98.1 F (36.7 C) 98.1 F (36.7 C) 97.8 F (36.6 C)   TempSrc:      SpO2: 98% 98% 96%   Weight:  97.4 kg    Height:        General: Pt is alert, awake, not in acute distress Cardiovascular: RRR, S1/S2 +, no rubs, no gallops Respiratory: CTA bilaterally, no wheezing, no rhonchi Abdominal: Soft, NT, ND, bowel sounds + Extremities: no edema, no cyanosis    The results of significant diagnostics from this hospitalization (including imaging, microbiology,  ancillary and laboratory) are listed below for reference.     Microbiology: No results found for this or any previous visit (from the past 240 hour(s)).   Labs: BNP (last 3 results) Recent Labs    09/01/19 1029 06/21/20 0925  BNP 116.0* 098.1*   Basic Metabolic Panel: Recent Labs  Lab 06/29/20 0413 06/30/20 0313 07/01/20 0310 07/02/20 0420 07/03/20 0248  NA 140 141 140 140 139  K 3.6 3.8 3.8 3.7 4.1  CL 91* 92* 90* 94* 91*  CO2 40* 40* 35* 34* 35*  GLUCOSE 91 108* 92 105* 74  BUN 19 22 25* 27* 29*  CREATININE 1.40* 1.42* 1.42* 1.39* 1.57*  CALCIUM 9.2 9.6 9.6 9.7 9.6  MG  --  2.5* 2.5* 2.5* 2.5*   Liver Function Tests: Recent Labs  Lab 07/01/20 0310  ALBUMIN 4.6   No results for input(s): LIPASE, AMYLASE in the last 168 hours. No results for input(s): AMMONIA in the last 168 hours. CBC: Recent  Labs  Lab 06/28/20 0431  WBC 6.4  HGB 15.4  HCT 49.6  MCV 102.3*  PLT 133*   Cardiac Enzymes: No results for input(s): CKTOTAL, CKMB, CKMBINDEX, TROPONINI in the last 168 hours. BNP: Invalid input(s): POCBNP CBG: No results for input(s): GLUCAP in the last 168 hours. D-Dimer No results for input(s): DDIMER in the last 72 hours. Hgb A1c No results for input(s): HGBA1C in the last 72 hours. Lipid Profile No results for input(s): CHOL, HDL, LDLCALC, TRIG, CHOLHDL, LDLDIRECT in the last 72 hours. Thyroid function studies No results for input(s): TSH, T4TOTAL, T3FREE, THYROIDAB in the last 72 hours.  Invalid input(s): FREET3 Anemia work up No results for input(s): VITAMINB12, FOLATE, FERRITIN, TIBC, IRON, RETICCTPCT in the last 72 hours. Urinalysis    Component Value Date/Time   COLORURINE YELLOW (A) 06/22/2020 1802   APPEARANCEUR HAZY (A) 06/22/2020 1802   LABSPEC 1.013 06/22/2020 1802   PHURINE 5.0 06/22/2020 1802   GLUCOSEU NEGATIVE 06/22/2020 1802   HGBUR NEGATIVE 06/22/2020 1802   BILIRUBINUR NEGATIVE 06/22/2020 1802   KETONESUR NEGATIVE 06/22/2020 1802   PROTEINUR NEGATIVE 06/22/2020 1802   NITRITE NEGATIVE 06/22/2020 1802   LEUKOCYTESUR NEGATIVE 06/22/2020 1802   Sepsis Labs Invalid input(s): PROCALCITONIN,  WBC,  LACTICIDVEN Microbiology No results found for this or any previous visit (from the past 240 hour(s)).   Time coordinating discharge: Over 30 minutes  SIGNED:   Sidney Ace, MD  [facility] 07/03/2020, 2:31 PM Pager   If 7PM-7AM, please contact night-coverage

## 2020-07-03 NOTE — Plan of Care (Signed)
°  Problem: Health Behavior/Discharge Planning: Goal: Ability to manage health-related needs will improve Outcome: Progressing   Problem: Activity: Goal: Risk for activity intolerance will decrease Outcome: Progressing   Problem: Elimination: Goal: Will not experience complications related to urinary retention Outcome: Progressing   Problem: Education: Goal: Ability to demonstrate management of disease process will improve Outcome: Progressing Goal: Ability to verbalize understanding of medication therapies will improve Outcome: Progressing

## 2020-07-03 NOTE — Progress Notes (Signed)
[name]  ROUNDING NOTE   Subjective:   Furosemide gtt 8mg /hr. UOP 194mL.   Patient states he is doing well and has no complaints.   Objective:  Vital signs in last 24 hours:  Temp:  [97.6 F (36.4 C)-98.1 F (36.7 C)] 97.8 F (36.6 C) (07/19 0741) Pulse Rate:  [71-81] 78 (07/19 1016) Resp:  [18] 18 (07/19 0741) BP: (107-115)/(70-85) 112/79 (07/19 1016) SpO2:  [96 %-100 %] 96 % (07/19 0741) Weight:  [97.4 kg] 97.4 kg (07/19 0439)  Weight change: -0.049 kg Filed Weights   07/02/20 0422 07/02/20 1141 07/03/20 0439  Weight: 99.2 kg 99.1 kg 97.4 kg    Intake/Output: I/O last 3 completed shifts: In: 1751 [P.O.:956; I.V.:283; IV Piggyback:200] Out: 0258 [Urine:3030]   Intake/Output this shift:  Total I/O In: 3 [I.V.:3] Out: 350 [Urine:350]  Physical Exam: General: Seated in chair  Head: Normocephalic, atraumatic. Moist oral mucosal membranes  Eyes: Anicteric  Neck: Supple, trachea midline  Lungs:  Clear to auscultation, room air  Heart: regular  Abdomen:  Soft, nontender  Extremities: trace peripheral edema   Neurologic: Alert and oriented  Skin: No acute rash       Basic Metabolic Panel: Recent Labs  Lab 06/29/20 0413 06/29/20 0413 06/30/20 0313 06/30/20 0313 07/01/20 0310 07/02/20 0420 07/03/20 0248  NA 140  --  141  --  140 140 139  K 3.6  --  3.8  --  3.8 3.7 4.1  CL 91*  --  92*  --  90* 94* 91*  CO2 40*  --  40*  --  35* 34* 35*  GLUCOSE 91  --  108*  --  92 105* 74  BUN 19  --  22  --  25* 27* 29*  CREATININE 1.40*  --  1.42*  --  1.42* 1.39* 1.57*  CALCIUM 9.2   < > 9.6   < > 9.6 9.7 9.6  MG  --   --  2.5*  --  2.5* 2.5* 2.5*   < > = values in this interval not displayed.    Liver Function Tests: Recent Labs  Lab 07/01/20 0310  ALBUMIN 4.6   No results for input(s): LIPASE, AMYLASE in the last 168 hours. No results for input(s): AMMONIA in the last 168 hours.  CBC: Recent Labs  Lab 06/28/20 0431  WBC 6.4  HGB  15.4  HCT 49.6  MCV 102.3*  PLT 133*    Cardiac Enzymes: No results for input(s): CKTOTAL, CKMB, CKMBINDEX, TROPONINI in the last 168 hours.  BNP: Invalid input(s): POCBNP  CBG: No results for input(s): GLUCAP in the last 168 hours.  Microbiology: Results for orders placed or performed during the hospital encounter of 06/21/20  SARS Coronavirus 2 by RT PCR (hospital order, performed in [facility] hospital lab) Nasopharyngeal Nasopharyngeal Swab     Status: None   Collection Time: 06/21/20 10:24 AM   Specimen: Nasopharyngeal Swab  Result Value Ref Range Status   SARS Coronavirus 2 NEGATIVE NEGATIVE Final    Comment: (NOTE) SARS-CoV-2 target nucleic acids are NOT DETECTED.  The SARS-CoV-2 RNA is generally detectable in upper and lower respiratory specimens during the acute phase of infection. The lowest concentration of SARS-CoV-2 viral copies this assay can detect is 250 copies / mL. A negative result does not preclude SARS-CoV-2 infection and should not be used as the sole basis for treatment or other patient management decisions.  A negative result may occur with improper specimen collection /  handling, submission of specimen other than nasopharyngeal swab, presence of viral mutation(s) within the areas targeted by this assay, and inadequate number of viral copies (<250 copies / mL). A negative result must be combined with clinical observations, patient history, and epidemiological information.  Fact Sheet for Patients:   StrictlyIdeas.no  Fact Sheet for Healthcare Providers: BankingDealers.co.za  This test is not yet approved or  cleared by the [place] FDA and has been authorized for detection and/or diagnosis of SARS-CoV-2 by FDA under an Emergency Use Authorization (EUA).  This EUA will remain in effect (meaning this test can be used) for the duration of the COVID-19 declaration under Section 564(b)(1) of the Act,  21 U.S.C. section 360bbb-3(b)(1), unless the authorization is terminated or revoked sooner.  Performed at [facility], [address] [place], [place] [address]     Coagulation Studies: No results for input(s): LABPROT, INR in the last 72 hours.  Urinalysis: No results for input(s): COLORURINE, LABSPEC, PHURINE, GLUCOSEU, HGBUR, BILIRUBINUR, KETONESUR, PROTEINUR, UROBILINOGEN, NITRITE, LEUKOCYTESUR in the last 72 hours.  Invalid input(s): APPERANCEUR    Imaging: No results found.   Medications:   . sodium chloride 250 mL (07/01/20 2333)   . aspirin EC  325 mg Oral Daily  . enoxaparin (LOVENOX) injection  40 mg Subcutaneous Q24H  . losartan  100 mg Oral Daily  . metoprolol succinate  50 mg Oral Daily  . polyethylene glycol  17 g Oral Daily  . sodium chloride flush  3 mL Intravenous Q12H  . torsemide  100 mg Oral Daily   sodium chloride, acetaminophen, ondansetron (ZOFRAN) IV, sodium chloride flush  Assessment/ Plan:   [name] is a 74 y.o. black male with hypertension, coronary artery disease who is admitted to [facility] on 06/21/2020 for Swelling [R60.9] Scrotal swelling [N50.89] Acute CHF (congestive heart failure) ([place]) [I50.9] Acute respiratory failure with hypoxia ([place]) [J96.01] AKI (acute kidney injury) ([place]) [N17.9] Acute on chronic congestive heart failure, unspecified heart failure type ([place]) [I50.9]  1. Acute renal failure on chronic kidney disease stage IIIA. Creatinine back to baseline. Creatinine 1.45, GFR of 47 on 08/31/2020. Bland urine.  Chronic kidney disease secondary to hypertension.  Acute renal failure secondary to cardiorenal failure.   2. Hypertension with acute exacerbation of systolic congestive heart failure and anasarca.   3. Hypokalemia: secondary to diuretics.   - transition to PO torsemide 100mg   - restart losartan  Will need close nephrology follow up.    LOS: 11 John [name] 7/19/20211:35 PM

## 2020-07-03 NOTE — Care Management Important Message (Signed)
Important Message  Patient Details  Name: John [name] MRN: [id number] Date of Birth: Oct 22, 1946   Medicare Important Message Given:  Yes     [name] 07/03/2020, 12:37 PM

## 2020-07-04 ENCOUNTER — Telehealth: Payer: Self-pay | Admitting: Family

## 2020-07-04 NOTE — Telephone Encounter (Signed)
Unable to reach patient regarding his new patient CHF Clinic appointment and to follow up since his hospital discharge.   John [name], NT

## 2020-07-14 DIAGNOSIS — I5033 Acute on chronic diastolic (congestive) heart failure: Secondary | ICD-10-CM | POA: Diagnosis not present

## 2020-07-14 DIAGNOSIS — I2584 Coronary atherosclerosis due to calcified coronary lesion: Secondary | ICD-10-CM | POA: Diagnosis not present

## 2020-07-14 DIAGNOSIS — I10 Essential (primary) hypertension: Secondary | ICD-10-CM | POA: Diagnosis not present

## 2020-07-14 DIAGNOSIS — I2510 Atherosclerotic heart disease of native coronary artery without angina pectoris: Secondary | ICD-10-CM | POA: Diagnosis not present

## 2020-07-14 DIAGNOSIS — I700 Atherosclerosis of aorta: Secondary | ICD-10-CM | POA: Diagnosis not present

## 2020-07-14 DIAGNOSIS — I712 Thoracic aortic aneurysm, without rupture: Secondary | ICD-10-CM | POA: Diagnosis not present

## 2020-07-14 DIAGNOSIS — E782 Mixed hyperlipidemia: Secondary | ICD-10-CM | POA: Diagnosis not present

## 2020-07-20 ENCOUNTER — Ambulatory Visit: Payer: Medicare HMO | Admitting: Family

## 2020-07-27 DIAGNOSIS — R600 Localized edema: Secondary | ICD-10-CM | POA: Diagnosis not present

## 2020-07-27 DIAGNOSIS — N1831 Chronic kidney disease, stage 3a: Secondary | ICD-10-CM | POA: Diagnosis not present

## 2020-07-27 DIAGNOSIS — I10 Essential (primary) hypertension: Secondary | ICD-10-CM | POA: Diagnosis not present

## 2020-07-27 DIAGNOSIS — I5022 Chronic systolic (congestive) heart failure: Secondary | ICD-10-CM | POA: Diagnosis not present

## 2020-08-03 DIAGNOSIS — I700 Atherosclerosis of aorta: Secondary | ICD-10-CM | POA: Diagnosis not present

## 2020-08-03 DIAGNOSIS — R600 Localized edema: Secondary | ICD-10-CM | POA: Diagnosis not present

## 2020-08-03 DIAGNOSIS — N1831 Chronic kidney disease, stage 3a: Secondary | ICD-10-CM | POA: Diagnosis not present

## 2020-08-10 DIAGNOSIS — R739 Hyperglycemia, unspecified: Secondary | ICD-10-CM | POA: Diagnosis not present

## 2020-08-10 DIAGNOSIS — Z79899 Other long term (current) drug therapy: Secondary | ICD-10-CM | POA: Diagnosis not present

## 2020-08-10 DIAGNOSIS — Z87891 Personal history of nicotine dependence: Secondary | ICD-10-CM | POA: Diagnosis not present

## 2020-08-10 DIAGNOSIS — I712 Thoracic aortic aneurysm, without rupture: Secondary | ICD-10-CM | POA: Diagnosis not present

## 2020-08-10 DIAGNOSIS — I5022 Chronic systolic (congestive) heart failure: Secondary | ICD-10-CM | POA: Diagnosis not present

## 2020-08-10 DIAGNOSIS — I130 Hypertensive heart and chronic kidney disease with heart failure and stage 1 through stage 4 chronic kidney disease, or unspecified chronic kidney disease: Secondary | ICD-10-CM | POA: Diagnosis not present

## 2020-08-10 DIAGNOSIS — N1831 Chronic kidney disease, stage 3a: Secondary | ICD-10-CM | POA: Diagnosis not present

## 2020-08-25 DIAGNOSIS — S76112D Strain of left quadriceps muscle, fascia and tendon, subsequent encounter: Secondary | ICD-10-CM | POA: Diagnosis not present

## 2020-08-31 ENCOUNTER — Other Ambulatory Visit: Payer: Self-pay

## 2020-08-31 NOTE — Patient Outreach (Signed)
[facility]) Care Management  08/31/2020  Akshath Mccarey 1946/08/19 [id number]   Telephone Assessment    Multiple attempts to establish contact with patient without success. No response from letter mailed to patient. Case is being closed at this time.    Plan: RN CM will close case at this time.   [name], RN,BSN,CCM [facility] Telephonic Care Management Coordinator Direct Phone: [phone] Toll Free: [phone] Fax: [phone]

## 2020-09-07 DIAGNOSIS — I10 Essential (primary) hypertension: Secondary | ICD-10-CM | POA: Diagnosis not present

## 2020-09-07 DIAGNOSIS — R600 Localized edema: Secondary | ICD-10-CM | POA: Diagnosis not present

## 2020-09-07 DIAGNOSIS — N1831 Chronic kidney disease, stage 3a: Secondary | ICD-10-CM | POA: Diagnosis not present

## 2020-09-07 DIAGNOSIS — I5022 Chronic systolic (congestive) heart failure: Secondary | ICD-10-CM | POA: Diagnosis not present

## 2020-09-07 DIAGNOSIS — R809 Proteinuria, unspecified: Secondary | ICD-10-CM | POA: Diagnosis not present

## 2020-09-18 DIAGNOSIS — I712 Thoracic aortic aneurysm, without rupture: Secondary | ICD-10-CM | POA: Diagnosis not present

## 2020-09-18 DIAGNOSIS — I10 Essential (primary) hypertension: Secondary | ICD-10-CM | POA: Diagnosis not present

## 2020-09-18 DIAGNOSIS — I2510 Atherosclerotic heart disease of native coronary artery without angina pectoris: Secondary | ICD-10-CM | POA: Diagnosis not present

## 2020-09-18 DIAGNOSIS — N1831 Chronic kidney disease, stage 3a: Secondary | ICD-10-CM | POA: Diagnosis not present

## 2020-09-18 DIAGNOSIS — I700 Atherosclerosis of aorta: Secondary | ICD-10-CM | POA: Diagnosis not present

## 2020-09-18 DIAGNOSIS — I2584 Coronary atherosclerosis due to calcified coronary lesion: Secondary | ICD-10-CM | POA: Diagnosis not present

## 2020-09-18 DIAGNOSIS — E782 Mixed hyperlipidemia: Secondary | ICD-10-CM | POA: Diagnosis not present

## 2020-09-18 DIAGNOSIS — I5022 Chronic systolic (congestive) heart failure: Secondary | ICD-10-CM | POA: Diagnosis not present

## 2021-01-08 DIAGNOSIS — I10 Essential (primary) hypertension: Secondary | ICD-10-CM | POA: Diagnosis not present

## 2021-01-08 DIAGNOSIS — Z87891 Personal history of nicotine dependence: Secondary | ICD-10-CM | POA: Diagnosis not present

## 2021-01-08 DIAGNOSIS — N1831 Chronic kidney disease, stage 3a: Secondary | ICD-10-CM | POA: Diagnosis not present

## 2021-01-08 DIAGNOSIS — R739 Hyperglycemia, unspecified: Secondary | ICD-10-CM | POA: Diagnosis not present

## 2021-01-08 DIAGNOSIS — I25118 Atherosclerotic heart disease of native coronary artery with other forms of angina pectoris: Secondary | ICD-10-CM | POA: Diagnosis not present

## 2021-01-08 DIAGNOSIS — I5022 Chronic systolic (congestive) heart failure: Secondary | ICD-10-CM | POA: Diagnosis not present

## 2021-01-11 DIAGNOSIS — N1831 Chronic kidney disease, stage 3a: Secondary | ICD-10-CM | POA: Diagnosis not present

## 2021-01-11 DIAGNOSIS — I5022 Chronic systolic (congestive) heart failure: Secondary | ICD-10-CM | POA: Diagnosis not present

## 2021-01-11 DIAGNOSIS — I10 Essential (primary) hypertension: Secondary | ICD-10-CM | POA: Diagnosis not present

## 2021-01-11 DIAGNOSIS — R809 Proteinuria, unspecified: Secondary | ICD-10-CM | POA: Diagnosis not present

## 2021-01-15 DIAGNOSIS — J432 Centrilobular emphysema: Secondary | ICD-10-CM | POA: Diagnosis not present

## 2021-01-15 DIAGNOSIS — I712 Thoracic aortic aneurysm, without rupture: Secondary | ICD-10-CM | POA: Diagnosis not present

## 2021-01-15 DIAGNOSIS — N183 Chronic kidney disease, stage 3 unspecified: Secondary | ICD-10-CM | POA: Diagnosis not present

## 2021-01-15 DIAGNOSIS — I130 Hypertensive heart and chronic kidney disease with heart failure and stage 1 through stage 4 chronic kidney disease, or unspecified chronic kidney disease: Secondary | ICD-10-CM | POA: Diagnosis not present

## 2021-01-15 DIAGNOSIS — R7309 Other abnormal glucose: Secondary | ICD-10-CM | POA: Diagnosis not present

## 2021-01-15 DIAGNOSIS — I5022 Chronic systolic (congestive) heart failure: Secondary | ICD-10-CM | POA: Diagnosis not present

## 2021-05-17 DIAGNOSIS — Z79899 Other long term (current) drug therapy: Secondary | ICD-10-CM | POA: Diagnosis not present

## 2021-05-17 DIAGNOSIS — I712 Thoracic aortic aneurysm, without rupture: Secondary | ICD-10-CM | POA: Diagnosis not present

## 2021-05-17 DIAGNOSIS — I5022 Chronic systolic (congestive) heart failure: Secondary | ICD-10-CM | POA: Diagnosis not present

## 2021-05-17 DIAGNOSIS — I130 Hypertensive heart and chronic kidney disease with heart failure and stage 1 through stage 4 chronic kidney disease, or unspecified chronic kidney disease: Secondary | ICD-10-CM | POA: Diagnosis not present

## 2021-05-17 DIAGNOSIS — I700 Atherosclerosis of aorta: Secondary | ICD-10-CM | POA: Diagnosis not present

## 2021-05-17 DIAGNOSIS — N183 Chronic kidney disease, stage 3 unspecified: Secondary | ICD-10-CM | POA: Diagnosis not present

## 2021-05-17 DIAGNOSIS — Z0000 Encounter for general adult medical examination without abnormal findings: Secondary | ICD-10-CM | POA: Diagnosis not present

## 2021-07-25 DIAGNOSIS — I10 Essential (primary) hypertension: Secondary | ICD-10-CM | POA: Diagnosis not present

## 2021-07-25 DIAGNOSIS — N2581 Secondary hyperparathyroidism of renal origin: Secondary | ICD-10-CM | POA: Diagnosis not present

## 2021-07-25 DIAGNOSIS — R809 Proteinuria, unspecified: Secondary | ICD-10-CM | POA: Diagnosis not present

## 2021-07-25 DIAGNOSIS — N1831 Chronic kidney disease, stage 3a: Secondary | ICD-10-CM | POA: Diagnosis not present

## 2021-07-25 DIAGNOSIS — I5022 Chronic systolic (congestive) heart failure: Secondary | ICD-10-CM | POA: Diagnosis not present

## 2021-09-18 DIAGNOSIS — H25813 Combined forms of age-related cataract, bilateral: Secondary | ICD-10-CM | POA: Diagnosis not present

## 2021-09-18 DIAGNOSIS — H524 Presbyopia: Secondary | ICD-10-CM | POA: Diagnosis not present

## 2021-09-27 DIAGNOSIS — H2513 Age-related nuclear cataract, bilateral: Secondary | ICD-10-CM | POA: Diagnosis not present

## 2021-09-27 DIAGNOSIS — Z0100 Encounter for examination of eyes and vision without abnormal findings: Secondary | ICD-10-CM | POA: Diagnosis not present

## 2021-09-27 DIAGNOSIS — H40003 Preglaucoma, unspecified, bilateral: Secondary | ICD-10-CM | POA: Diagnosis not present

## 2021-10-15 ENCOUNTER — Encounter: Payer: Self-pay | Admitting: Ophthalmology

## 2021-10-15 DIAGNOSIS — H2512 Age-related nuclear cataract, left eye: Secondary | ICD-10-CM | POA: Diagnosis not present

## 2021-10-30 NOTE — Discharge Instructions (Signed)

## 2021-10-31 ENCOUNTER — Ambulatory Visit: Payer: Medicare HMO | Admitting: Anesthesiology

## 2021-10-31 ENCOUNTER — Ambulatory Visit
Admission: RE | Admit: 2021-10-31 | Discharge: 2021-10-31 | Disposition: A | Discharge status: Home / Self Care | Payer: Medicare HMO | Attending: Ophthalmology | Admitting: Ophthalmology

## 2021-10-31 ENCOUNTER — Encounter: Payer: Self-pay | Admitting: Ophthalmology

## 2021-10-31 ENCOUNTER — Other Ambulatory Visit: Payer: Self-pay

## 2021-10-31 ENCOUNTER — Encounter
Admission: RE | Disposition: A | Discharge status: Home / Self Care | Payer: Self-pay | Source: Home / Self Care | Attending: Ophthalmology

## 2021-10-31 DIAGNOSIS — J449 Chronic obstructive pulmonary disease, unspecified: Secondary | ICD-10-CM | POA: Diagnosis not present

## 2021-10-31 DIAGNOSIS — I509 Heart failure, unspecified: Secondary | ICD-10-CM | POA: Diagnosis not present

## 2021-10-31 DIAGNOSIS — H25812 Combined forms of age-related cataract, left eye: Secondary | ICD-10-CM | POA: Diagnosis not present

## 2021-10-31 DIAGNOSIS — I110 Hypertensive heart disease with heart failure: Secondary | ICD-10-CM | POA: Diagnosis not present

## 2021-10-31 DIAGNOSIS — H268 Other specified cataract: Secondary | ICD-10-CM | POA: Insufficient documentation

## 2021-10-31 DIAGNOSIS — Z6831 Body mass index (BMI) 31.0-31.9, adult: Secondary | ICD-10-CM | POA: Insufficient documentation

## 2021-10-31 DIAGNOSIS — E669 Obesity, unspecified: Secondary | ICD-10-CM | POA: Insufficient documentation

## 2021-10-31 DIAGNOSIS — H2512 Age-related nuclear cataract, left eye: Secondary | ICD-10-CM | POA: Diagnosis not present

## 2021-10-31 DIAGNOSIS — Z87891 Personal history of nicotine dependence: Secondary | ICD-10-CM | POA: Diagnosis not present

## 2021-10-31 HISTORY — PX: CATARACT EXTRACTION W/PHACO: SHX586

## 2021-10-31 HISTORY — DX: Heart failure, unspecified: I50.9

## 2021-10-31 SURGERY — PHACOEMULSIFICATION, CATARACT, WITH IOL INSERTION
Anesthesia: Monitor Anesthesia Care | Site: Eye | Laterality: Left

## 2021-10-31 MED ORDER — SIGHTPATH DOSE#1 BSS IO SOLN
INTRAOCULAR | Status: DC | PRN
  Administered 2021-10-31: 1 mL

## 2021-10-31 MED ORDER — ONDANSETRON HCL 4 MG/2ML IJ SOLN: 4.0000 mg | Freq: Once | INTRAMUSCULAR | Status: DC | PRN

## 2021-10-31 MED ORDER — SIGHTPATH DOSE#1 BSS IO SOLN
INTRAOCULAR | Status: DC | PRN
  Administered 2021-10-31: 15 mL

## 2021-10-31 MED ORDER — ARMC OPHTHALMIC DILATING DROPS
1.0000 "application " | OPHTHALMIC | Status: DC | PRN
  Administered 2021-10-31 (×3): 1 via OPHTHALMIC

## 2021-10-31 MED ORDER — BRIMONIDINE TARTRATE-TIMOLOL 0.2-0.5 % OP SOLN
OPHTHALMIC | Status: DC | PRN
  Administered 2021-10-31: 1 [drp] via OPHTHALMIC

## 2021-10-31 MED ORDER — CEFUROXIME OPHTHALMIC INJECTION 1 MG/0.1 ML
INJECTION | OPHTHALMIC | Status: DC | PRN
  Administered 2021-10-31: 0.1 mL via INTRACAMERAL

## 2021-10-31 MED ORDER — TETRACAINE HCL 0.5 % OP SOLN
1.0000 [drp] | OPHTHALMIC | Status: DC | PRN
  Administered 2021-10-31 (×3): 1 [drp] via OPHTHALMIC

## 2021-10-31 MED ORDER — FENTANYL CITRATE (PF) 100 MCG/2ML IJ SOLN
INTRAMUSCULAR | Status: DC | PRN
  Administered 2021-10-31: 50 ug via INTRAVENOUS

## 2021-10-31 MED ORDER — SIGHTPATH DOSE#1 BSS IO SOLN
INTRAOCULAR | Status: DC | PRN
  Administered 2021-10-31: 88 mL via OPHTHALMIC

## 2021-10-31 MED ORDER — SIGHTPATH DOSE#1 NA HYALUR & NA CHOND-NA HYALUR IO KIT
PACK | INTRAOCULAR | Status: DC | PRN
  Administered 2021-10-31: 1 via OPHTHALMIC

## 2021-10-31 MED ORDER — ACETAMINOPHEN 325 MG PO TABS: 325.0000 mg | ORAL_TABLET | ORAL | Status: DC | PRN

## 2021-10-31 MED ORDER — MIDAZOLAM HCL 2 MG/2ML IJ SOLN
INTRAMUSCULAR | Status: DC | PRN
  Administered 2021-10-31: 2 mg via INTRAVENOUS

## 2021-10-31 MED ORDER — ACETAMINOPHEN 160 MG/5ML PO SOLN: 325.0000 mg | ORAL | Status: DC | PRN

## 2021-10-31 SURGICAL SUPPLY — 16 items
CANNULA ANT/CHMB 27GA (MISCELLANEOUS) IMPLANT
GLOVE SRG 8 PF TXTR STRL LF DI (GLOVE) ×1 IMPLANT
GLOVE SURG ENC TEXT LTX SZ7.5 (GLOVE) ×3 IMPLANT
GLOVE SURG UNDER POLY LF SZ8 (GLOVE) ×3
GOWN STRL REUS W/ TWL LRG LVL3 (GOWN DISPOSABLE) ×2 IMPLANT
GOWN STRL REUS W/TWL LRG LVL3 (GOWN DISPOSABLE) ×6
LENS IOL TECNIS EYHANCE 19.5 (Intraocular Lens) ×3 IMPLANT
MARKER SKIN DUAL TIP RULER LAB (MISCELLANEOUS) ×3 IMPLANT
NEEDLE CAPSULORHEX 25GA (NEEDLE) IMPLANT
NEEDLE FILTER BLUNT 18X 1/2SAF (NEEDLE) ×4
NEEDLE FILTER BLUNT 18X1 1/2 (NEEDLE) ×2 IMPLANT
PACK EYE AFTER SURG (MISCELLANEOUS) IMPLANT
SYR 3ML LL SCALE MARK (SYRINGE) ×6 IMPLANT
SYR TB 1ML LUER SLIP (SYRINGE) ×3 IMPLANT
WATER STERILE IRR 250ML POUR (IV SOLUTION) ×3 IMPLANT
WIPE NON LINTING 3.25X3.25 (MISCELLANEOUS) ×3 IMPLANT

## 2021-10-31 NOTE — Anesthesia Preprocedure Evaluation (Addendum)
Anesthesia Evaluation  Patient identified by MRN, date of birth, ID band Patient awake    Reviewed: Allergy & Precautions, NPO status   Airway Mallampati: II  TM Distance: >3 FB     Dental   Pulmonary COPD, former smoker,    Pulmonary exam normal        Cardiovascular hypertension,  Rhythm:Regular Rate:Normal  TTE 2021 1. Left ventricular ejection fraction, by estimation, is 45 to 50%. The left ventricle has mildly decreased function. The left ventricle has no regional wall motion abnormalities. Left ventricular diastolic parameters were normal.  2. Right ventricular systolic function is normal. The right ventricular size is normal. There is normal pulmonary artery systolic pressure.  3. The mitral valve is normal in structure. Trivial mitral valve regurgitation.  4. The aortic valve is normal in structure. Aortic valve regurgitation is not visualized.    Neuro/Psych    GI/Hepatic   Endo/Other  Obesity - BMI > 30  Renal/GU      Musculoskeletal   Abdominal   Peds  Hematology   Anesthesia Other Findings   Reproductive/Obstetrics                             Anesthesia Physical Anesthesia Plan  ASA: 3  Anesthesia Plan: MAC   Post-op Pain Management:    Induction: Intravenous  PONV Risk Score and Plan: TIVA, Midazolam and Treatment may vary due to age or medical condition  Airway Management Planned: Natural Airway and Nasal Cannula  Additional Equipment:   Intra-op Plan:   Post-operative Plan:   Informed Consent: I have reviewed the patients History and Physical, chart, labs and discussed the procedure including the risks, benefits and alternatives for the proposed anesthesia with the patient or authorized representative who has indicated his/her understanding and acceptance.       Plan Discussed with: CRNA  Anesthesia Plan Comments:       Anesthesia Quick  Evaluation

## 2021-10-31 NOTE — Anesthesia Postprocedure Evaluation (Signed)
Anesthesia Post Note  Patient: John [name]  Procedure(s) Performed: CATARACT EXTRACTION PHACO AND INTRAOCULAR LENS PLACEMENT (IOC) LEFT VISION BLUE 3.45 00:48.0 (Left: Eye)     Patient location during evaluation: PACU Anesthesia Type: MAC Level of consciousness: awake Pain management: pain level controlled Vital Signs Assessment: post-procedure vital signs reviewed and stable Respiratory status: respiratory function stable Cardiovascular status: stable Postop Assessment: no apparent nausea or vomiting Anesthetic complications: no   No notable events documented.  Veda Canning

## 2021-10-31 NOTE — Anesthesia Procedure Notes (Signed)
Procedure Name: MAC Date/Time: 10/31/2021 9:51 AM Performed by: Jeannene Patella, CRNA Pre-anesthesia Checklist: Patient identified, Emergency Drugs available, Suction available, Timeout performed and Patient being monitored Patient Re-evaluated:Patient Re-evaluated prior to induction Oxygen Delivery Method: Nasal cannula Placement Confirmation: positive ETCO2

## 2021-10-31 NOTE — H&P (Signed)
.[place]   Primary Care Physician:  Tracie Harrier, MD Ophthalmologist: Dr. Leandrew Koyanagi  Pre-Procedure History & Physical: HPI:  John [name] is a 75 y.o. male here for ophthalmic surgery.   Past Medical History:  Diagnosis Date   CHF (congestive heart failure) (HCC)    EF 45% in 2021   COPD (chronic obstructive pulmonary disease) ([place]) 02/16/2020   Hypertension     Past Surgical History:  Procedure Laterality Date   INGUINAL HERNIA REPAIR Right 02/04/2017   Procedure: HERNIA REPAIR INGUINAL ADULT;  Surgeon: Leonie Green, MD;  Location: [facility] ORS;  Service: General;  Laterality: Right;   NO PAST SURGERIES     QUADRICEPS TENDON REPAIR Left 02/15/2020   Procedure: REPAIR QUADRICEP TENDON;  Surgeon: Corky Mull, MD;  Location: [facility] ORS;  Service: Orthopedics;  Laterality: Left;    Prior to Admission medications   Medication Sig Start Date End Date Taking? Authorizing Provider  aspirin 81 MG chewable tablet Chew 81 mg by mouth daily.   Yes [provider]  aspirin EC 325 MG tablet Take 1 tablet (325 mg total) by mouth daily. 02/16/20  Yes Lattie Corns, PA-C  losartan (COZAAR) 100 MG tablet Take 100 mg by mouth daily. 06/13/20  Yes [provider]  oxyCODONE (OXY IR/ROXICODONE) 5 MG immediate release tablet Take 1-2 tablets (5-10 mg total) by mouth every 4 (four) hours as needed for severe pain (pain score 7-10). 02/16/20  Yes Lattie Corns, PA-C  torsemide (DEMADEX) 100 MG tablet Take 1 tablet (100 mg total) by mouth daily. 07/03/20 08/02/20  Sidney Ace, MD    Allergies as of 09/28/2021   (No Known Allergies)    Family History  Problem Relation Age of Onset   Diabetes Mother     Social History   Socioeconomic History   Marital status: Single    Spouse name: Not on file   Number of children: Not on file   Years of education: Not on file   Highest education level: Not on file  Occupational History   Not on  file  Tobacco Use   Smoking status: Former    Types: Cigarettes    Quit date: 02/15/2015    Years since quitting: 6.7   Smokeless tobacco: Never  Substance and Sexual Activity   Alcohol use: Yes    Comment: occassional   Drug use: No   Sexual activity: Not on file  Other Topics Concern   Not on file  Social History Narrative   Not on file   Social Determinants of Health   Financial Resource Strain: Not on file  Food Insecurity: Not on file  Transportation Needs: Not on file  Physical Activity: Not on file  Stress: Not on file  Social Connections: Not on file  Intimate Partner Violence: Not on file    Review of Systems: See HPI, otherwise negative ROS  Physical Exam: BP 112/86   Pulse 80   Temp 97.8 F (36.6 C)   Resp 18   Ht 5' 10.5" (1.791 m)   Wt 101.4 kg   SpO2 91%   BMI 31.63 kg/m  General:   Alert,  pleasant and cooperative in NAD Head:  Normocephalic and atraumatic. Lungs:  Clear to auscultation.    Heart:  Regular rate and rhythm.   Impression/Plan: John [name] is here for ophthalmic surgery.  Risks, benefits, limitations, and alternatives regarding ophthalmic surgery have been reviewed with the patient.  Questions have been answered.  All parties agreeable.   Leandrew Koyanagi, MD  10/31/2021, 9:21 AM

## 2021-10-31 NOTE — Transfer of Care (Signed)
Immediate Anesthesia Transfer of Care Note  Patient: John [name]  Procedure(s) Performed: CATARACT EXTRACTION PHACO AND INTRAOCULAR LENS PLACEMENT (IOC) LEFT VISION BLUE 3.45 00:48.0 (Left: Eye)  Patient Location: PACU  Anesthesia Type: MAC  Level of Consciousness: awake, alert  and patient cooperative  Airway and Oxygen Therapy: Patient Spontanous Breathing and Patient connected to supplemental oxygen  Post-op Assessment: Post-op Vital signs reviewed, Patient's Cardiovascular Status Stable, Respiratory Function Stable, Patent Airway and No signs of Nausea or vomiting  Post-op Vital Signs: Reviewed and stable  Complications: No notable events documented.

## 2021-10-31 NOTE — Op Note (Signed)
OPERATIVE NOTE  John [name] [id number] 10/31/2021   PREOPERATIVE DIAGNOSIS:       Mature (Total) Cataract Left Eye H25.89   POSTOPERATIVE DIAGNOSIS:same          PROCEDURE:  Phacoemusification with posterior chamber intraocular lens placement of the left eye .  Vision Blue dye was used to stain the lens capsule.  LENS:   Implant Name Type Inv. Item Serial No. Manufacturer Lot No. LRB No. Used Action  LENS IOL TECNIS EYHANCE 19.5 - [id number] Intraocular Lens LENS IOL TECNIS EYHANCE 19.5 [id number] [id number]   Left 1 Implanted       ULTRASOUND TIME:  0 minutes 48 seconds, CDE 3.5  SURGEON:  Wyonia Hough, MD   ANESTHESIA:  Topical with tetracaine drops and 2% Xylocaine jelly, augmented with 1% preservative-free intracameral lidocaine.       COMPLICATIONS:  None.   DESCRIPTION OF PROCEDURE:  The patient was identified in the holding room and transported to the operating room and placed in the supine position under the operating microscope.  The left eye was identified as the operative eye and it was prepped and draped in the usual sterile ophthalmic fashion.    A 1 millimeter clear-corneal paracentesis was made at the 1:30 position.  0.5 ml of preservative-free 1% lidocaine was injected into the anterior chamber. The anterior chamber was filled with Healon 5 viscoelastic.  A 2.4 millimeter keratome was used to make a near-clear corneal incision at the 10:30 position.  The anterior chamber was filled with Healon 5 viscoelastic.  Vision Blue dye was then injected under the viscoelastic to stain the lens capsule.  BSS was then used to wash the dye out.  Additional Healon 5 was placed into the anterior chamber.  A curvilinear capsulorrhexis was made with a cystotome and capsulorrhexis forceps.  Balanced salt solution was used to hydrodissect and hydrodelineate the nucleus.  Viscoat was then placed in the anterior chamber.   Phacoemulsification was then used in stop and  chop fashion to remove the lens nucleus and epinucleus.  The remaining cortex was then removed using the irrigation and aspiration handpiece. Provisc was then placed into the capsular bag to distend it for lens placement.  A 19.5 -diopter lens was then injected into the capsular bag.  The remaining viscoelastic was aspirated.   Wounds were hydrated with balanced salt solution.  The anterior chamber was inflated to a physiologic pressure with balanced salt solution. Cefuroxime 0.1 ml of a 10mg /ml solution was injected into the anterior chamber for a dose of 1 mg of intracameral antibiotic at the completion of the case.  No wound leaks were noted.  Topical combigan drops were applied to the eye.  The patient was taken to the recovery room in stable condition without complications of anesthesia or surgery.  [name] 10/31/2021, 10:14 AM

## 2021-11-01 ENCOUNTER — Encounter: Payer: Self-pay | Admitting: Ophthalmology

## 2021-11-01 ENCOUNTER — Other Ambulatory Visit: Payer: Self-pay

## 2021-11-06 DIAGNOSIS — H2511 Age-related nuclear cataract, right eye: Secondary | ICD-10-CM | POA: Diagnosis not present

## 2021-11-14 ENCOUNTER — Ambulatory Visit: Payer: Medicare HMO | Admitting: Anesthesiology

## 2021-11-14 ENCOUNTER — Other Ambulatory Visit: Payer: Self-pay

## 2021-11-14 ENCOUNTER — Ambulatory Visit
Admission: RE | Admit: 2021-11-14 | Discharge: 2021-11-14 | Disposition: A | Discharge status: Home / Self Care | Payer: Medicare HMO | Attending: Ophthalmology | Admitting: Ophthalmology

## 2021-11-14 ENCOUNTER — Encounter
Admission: RE | Disposition: A | Discharge status: Home / Self Care | Payer: Self-pay | Source: Home / Self Care | Attending: Ophthalmology

## 2021-11-14 ENCOUNTER — Encounter: Payer: Self-pay | Admitting: Ophthalmology

## 2021-11-14 DIAGNOSIS — Z961 Presence of intraocular lens: Secondary | ICD-10-CM | POA: Diagnosis not present

## 2021-11-14 DIAGNOSIS — E669 Obesity, unspecified: Secondary | ICD-10-CM | POA: Diagnosis not present

## 2021-11-14 DIAGNOSIS — Z87891 Personal history of nicotine dependence: Secondary | ICD-10-CM | POA: Diagnosis not present

## 2021-11-14 DIAGNOSIS — Z6831 Body mass index (BMI) 31.0-31.9, adult: Secondary | ICD-10-CM | POA: Insufficient documentation

## 2021-11-14 DIAGNOSIS — H2511 Age-related nuclear cataract, right eye: Secondary | ICD-10-CM | POA: Insufficient documentation

## 2021-11-14 DIAGNOSIS — I10 Essential (primary) hypertension: Secondary | ICD-10-CM | POA: Insufficient documentation

## 2021-11-14 DIAGNOSIS — H25811 Combined forms of age-related cataract, right eye: Secondary | ICD-10-CM | POA: Diagnosis not present

## 2021-11-14 DIAGNOSIS — Z9842 Cataract extraction status, left eye: Secondary | ICD-10-CM | POA: Diagnosis not present

## 2021-11-14 HISTORY — PX: CATARACT EXTRACTION W/PHACO: SHX586

## 2021-11-14 SURGERY — PHACOEMULSIFICATION, CATARACT, WITH IOL INSERTION
Anesthesia: Monitor Anesthesia Care | Site: Eye | Laterality: Right

## 2021-11-14 MED ORDER — SIGHTPATH DOSE#1 BSS IO SOLN
INTRAOCULAR | Status: DC | PRN
  Administered 2021-11-14: 15 mL

## 2021-11-14 MED ORDER — ARMC OPHTHALMIC DILATING DROPS
1.0000 "application " | OPHTHALMIC | Status: DC | PRN
  Administered 2021-11-14 (×3): 1 via OPHTHALMIC

## 2021-11-14 MED ORDER — CEFUROXIME OPHTHALMIC INJECTION 1 MG/0.1 ML
INJECTION | OPHTHALMIC | Status: DC | PRN
  Administered 2021-11-14: 0.1 mL via INTRACAMERAL

## 2021-11-14 MED ORDER — SIGHTPATH DOSE#1 NA HYALUR & NA CHOND-NA HYALUR IO KIT
PACK | INTRAOCULAR | Status: DC | PRN
  Administered 2021-11-14: 1 via OPHTHALMIC

## 2021-11-14 MED ORDER — SIGHTPATH DOSE#1 BSS IO SOLN
INTRAOCULAR | Status: DC | PRN
  Administered 2021-11-14: 1 mL

## 2021-11-14 MED ORDER — TETRACAINE HCL 0.5 % OP SOLN
1.0000 [drp] | OPHTHALMIC | Status: DC | PRN
  Administered 2021-11-14 (×2): 1 [drp] via OPHTHALMIC

## 2021-11-14 MED ORDER — ACETAMINOPHEN 160 MG/5ML PO SOLN: 975.0000 mg | Freq: Once | ORAL | Status: DC | PRN

## 2021-11-14 MED ORDER — BRIMONIDINE TARTRATE-TIMOLOL 0.2-0.5 % OP SOLN
OPHTHALMIC | Status: DC | PRN
  Administered 2021-11-14: 1 [drp] via OPHTHALMIC

## 2021-11-14 MED ORDER — SIGHTPATH DOSE#1 BSS IO SOLN
INTRAOCULAR | Status: DC | PRN
  Administered 2021-11-14: 79 mL via OPHTHALMIC

## 2021-11-14 MED ORDER — ACETAMINOPHEN 500 MG PO TABS: 1000.0000 mg | ORAL_TABLET | Freq: Once | ORAL | Status: DC | PRN

## 2021-11-14 MED ORDER — ONDANSETRON HCL 4 MG/2ML IJ SOLN: 4.0000 mg | Freq: Once | INTRAMUSCULAR | Status: DC | PRN

## 2021-11-14 MED ORDER — LACTATED RINGERS IV SOLN: INTRAVENOUS | Status: DC

## 2021-11-14 MED ORDER — MIDAZOLAM HCL 2 MG/2ML IJ SOLN
INTRAMUSCULAR | Status: DC | PRN
  Administered 2021-11-14: 1 mg via INTRAVENOUS

## 2021-11-14 SURGICAL SUPPLY — 17 items
CANNULA ANT/CHMB 27GA (MISCELLANEOUS) ×2 IMPLANT
GLOVE SRG 8 PF TXTR STRL LF DI (GLOVE) ×1 IMPLANT
GLOVE SURG ENC TEXT LTX SZ7.5 (GLOVE) ×2 IMPLANT
GLOVE SURG UNDER POLY LF SZ8 (GLOVE) ×2
GOWN STRL REUS W/ TWL LRG LVL3 (GOWN DISPOSABLE) ×2 IMPLANT
GOWN STRL REUS W/TWL LRG LVL3 (GOWN DISPOSABLE) ×4
LENS IOL TECNIS EYHANCE 20.5 (Intraocular Lens) ×2 IMPLANT
MARKER SKIN DUAL TIP RULER LAB (MISCELLANEOUS) ×2 IMPLANT
NEEDLE CAPSULORHEX 25GA (NEEDLE) IMPLANT
NEEDLE FILTER BLUNT 18X 1/2SAF (NEEDLE) ×2
NEEDLE FILTER BLUNT 18X1 1/2 (NEEDLE) ×2 IMPLANT
PACK EYE AFTER SURG (MISCELLANEOUS) ×2 IMPLANT
PACK VIT ANT 23G (MISCELLANEOUS) IMPLANT
SYR 3ML LL SCALE MARK (SYRINGE) ×4 IMPLANT
SYR TB 1ML LUER SLIP (SYRINGE) ×2 IMPLANT
WATER STERILE IRR 250ML POUR (IV SOLUTION) ×2 IMPLANT
WIPE NON LINTING 3.25X3.25 (MISCELLANEOUS) ×2 IMPLANT

## 2021-11-14 NOTE — Anesthesia Preprocedure Evaluation (Signed)
Anesthesia Evaluation  Patient identified by MRN, date of birth, ID band Patient awake    Reviewed: Allergy & Precautions, NPO status   Airway Mallampati: II  TM Distance: >3 FB     Dental   Pulmonary pneumonia (2018 - oxygen levels will drop with exertion), COPD, former smoker,  Plays tennis 3x/week      rales (bibasilar, not extensive)    Cardiovascular hypertension,  Rhythm:Regular Rate:Normal  TTE 2021 1. Left ventricular ejection fraction, by estimation, is 45 to 50%. The left ventricle has mildly decreased function. The left ventricle has no regional wall motion abnormalities. Left ventricular diastolic parameters were normal.  2. Right ventricular systolic function is normal. The right ventricular size is normal. There is normal pulmonary artery systolic pressure.  3. The mitral valve is normal in structure. Trivial mitral valve regurgitation.  4. The aortic valve is normal in structure. Aortic valve regurgitation is not visualized.    Neuro/Psych    GI/Hepatic   Endo/Other  Obesity - BMI > 30  Renal/GU      Musculoskeletal   Abdominal   Peds  Hematology   Anesthesia Other Findings O2 levels in the 70s on arrival on RA. [place] to mid to low 90s with rest and deep breathing. Patient reports this is normal for him. Played tennis yesterday without difficulty. CHF symptoms seem at baseline. 2+ pitting edema at the ankle. Patient relates it vacillates but is not out of the ordinary for him.  Reproductive/Obstetrics                             Anesthesia Physical  Anesthesia Plan  ASA: 4  Anesthesia Plan: MAC   Post-op Pain Management:    Induction: Intravenous  PONV Risk Score and Plan: TIVA, Midazolam and Treatment may vary due to age or medical condition  Airway Management Planned: Natural Airway and Nasal Cannula  Additional Equipment:   Intra-op Plan:   Post-operative Plan:    Informed Consent: I have reviewed the patients History and Physical, chart, labs and discussed the procedure including the risks, benefits and alternatives for the proposed anesthesia with the patient or authorized representative who has indicated his/her understanding and acceptance.       Plan Discussed with: CRNA  Anesthesia Plan Comments:         Anesthesia Quick Evaluation

## 2021-11-14 NOTE — H&P (Signed)
[place]   Primary Care Physician:  Tracie Harrier, MD Ophthalmologist: Dr. Leandrew Koyanagi  Pre-Procedure History & Physical: HPI:  John [name] is a 75 y.o. male here for ophthalmic surgery.   Past Medical History:  Diagnosis Date   CHF (congestive heart failure) (HCC)    EF 45% in 2021   COPD (chronic obstructive pulmonary disease) ([place]) 02/16/2020   Hypertension     Past Surgical History:  Procedure Laterality Date   CATARACT EXTRACTION W/PHACO Left 10/31/2021   Procedure: CATARACT EXTRACTION PHACO AND INTRAOCULAR LENS PLACEMENT ([place]) LEFT VISION BLUE 3.45 00:48.0;  Surgeon: Leandrew Koyanagi, MD;  Location: [facility];  Service: Ophthalmology;  Laterality: Left;   INGUINAL HERNIA REPAIR Right 02/04/2017   Procedure: HERNIA REPAIR INGUINAL ADULT;  Surgeon: Leonie Green, MD;  Location: [facility] ORS;  Service: General;  Laterality: Right;   NO PAST SURGERIES     QUADRICEPS TENDON REPAIR Left 02/15/2020   Procedure: REPAIR QUADRICEP TENDON;  Surgeon: Corky Mull, MD;  Location: [facility] ORS;  Service: Orthopedics;  Laterality: Left;    Prior to Admission medications   Medication Sig Start Date End Date Taking? Authorizing Provider  aspirin 81 MG chewable tablet Chew 81 mg by mouth daily.   Yes [provider]  aspirin EC 325 MG tablet Take 1 tablet (325 mg total) by mouth daily. 02/16/20  Yes Lattie Corns, PA-C  losartan (COZAAR) 100 MG tablet Take 100 mg by mouth daily. 06/13/20  Yes [provider]  torsemide (DEMADEX) 100 MG tablet Take 1 tablet (100 mg total) by mouth daily. 07/03/20 11/14/21 Yes Sidney Ace, MD    Allergies as of 09/28/2021   (No Known Allergies)    Family History  Problem Relation Age of Onset   Diabetes Mother     Social History   Socioeconomic History   Marital status: Single    Spouse name: Not on file   Number of children: Not on file   Years of education: Not on file   Highest  education level: Not on file  Occupational History   Not on file  Tobacco Use   Smoking status: Former    Types: Cigarettes    Quit date: 02/15/2015    Years since quitting: 6.7   Smokeless tobacco: Never  Substance and Sexual Activity   Alcohol use: Yes    Comment: occassional   Drug use: No   Sexual activity: Not on file  Other Topics Concern   Not on file  Social History Narrative   Not on file   Social Determinants of Health   Financial Resource Strain: Not on file  Food Insecurity: Not on file  Transportation Needs: Not on file  Physical Activity: Not on file  Stress: Not on file  Social Connections: Not on file  Intimate Partner Violence: Not on file    Review of Systems: See HPI, otherwise negative ROS  Physical Exam: BP (!) 146/94   Pulse 94   Temp 97.8 F (36.6 C) (Temporal)   Resp 18   Ht 5' 10.51" (1.791 m)   Wt 102.5 kg   SpO2 95%   BMI 31.96 kg/m  General:   Alert,  pleasant and cooperative in NAD Head:  Normocephalic and atraumatic. Lungs:  Clear to auscultation.   Exp wheeze bilat. Heart:  Regular rate and rhythm.  Impression/Plan: [name] is here for ophthalmic surgery.  Risks, benefits, limitations, and alternatives regarding ophthalmic surgery have been reviewed with the  patient.  Questions have been answered.  All parties agreeable.   Leandrew Koyanagi, MD  11/14/2021, 8:17 AM

## 2021-11-14 NOTE — Op Note (Signed)
  LOCATION:  [facility]   PREOPERATIVE DIAGNOSIS:    Nuclear sclerotic cataract right eye. H25.11   POSTOPERATIVE DIAGNOSIS:  Nuclear sclerotic cataract right eye.     PROCEDURE:  Phacoemusification with posterior chamber intraocular lens placement of the right eye   ULTRASOUND TIME: Procedure(s): CATARACT EXTRACTION PHACO AND INTRAOCULAR LENS PLACEMENT (IOC) RIGHT 8.59 01:10.5 (Right)  LENS:   Implant Name Type Inv. Item Serial No. Manufacturer Lot No. LRB No. Used Action  LENS IOL TECNIS EYHANCE 20.5 - [id number] Intraocular Lens LENS IOL TECNIS EYHANCE 20.5 [id number] JOHNSON   Right 1 Implanted         SURGEON:  Wyonia Hough, MD   ANESTHESIA:  Topical with tetracaine drops and 2% Xylocaine jelly, augmented with 1% preservative-free intracameral lidocaine.    COMPLICATIONS:  None.   DESCRIPTION OF PROCEDURE:  The patient was identified in the holding room and transported to the operating room and placed in the supine position under the operating microscope.  The right eye was identified as the operative eye and it was prepped and draped in the usual sterile ophthalmic fashion.   A 1 millimeter clear-corneal paracentesis was made at the 12:00 position.  0.5 ml of preservative-free 1% lidocaine was injected into the anterior chamber. The anterior chamber was filled with Viscoat viscoelastic.  A 2.4 millimeter keratome was used to make a near-clear corneal incision at the 9:00 position.  A curvilinear capsulorrhexis was made with a cystotome and capsulorrhexis forceps.  Balanced salt solution was used to hydrodissect and hydrodelineate the nucleus.   Phacoemulsification was then used in stop and chop fashion to remove the lens nucleus and epinucleus.  The remaining cortex was then removed using the irrigation and aspiration handpiece. Provisc was then placed into the capsular bag to distend it for lens placement.  A lens was then injected into the capsular bag.  The  remaining viscoelastic was aspirated.   Wounds were hydrated with balanced salt solution.  The anterior chamber was inflated to a physiologic pressure with balanced salt solution.  No wound leaks were noted. Cefuroxime 0.1 ml of a 10mg /ml solution was injected into the anterior chamber for a dose of 1 mg of intracameral antibiotic at the completion of the case.   Timolol and Brimonidine drops were applied to the eye.  The patient was taken to the recovery room in stable condition without complications of anesthesia or surgery.   Asmi Fugere 11/14/2021, 9:45 AM

## 2021-11-14 NOTE — Anesthesia Postprocedure Evaluation (Signed)
Anesthesia Post Note  Patient: John [name]  Procedure(s) Performed: CATARACT EXTRACTION PHACO AND INTRAOCULAR LENS PLACEMENT (IOC) RIGHT 8.59 01:10.5 (Right: Eye)     Patient location during evaluation: PACU Anesthesia Type: MAC Level of consciousness: awake and alert Pain management: pain level controlled Vital Signs Assessment: post-procedure vital signs reviewed and stable Respiratory status: spontaneous breathing, nonlabored ventilation and respiratory function stable Cardiovascular status: stable and blood pressure returned to baseline Postop Assessment: no apparent nausea or vomiting Anesthetic complications: no   No notable events documented.  April Manson

## 2021-11-14 NOTE — Anesthesia Procedure Notes (Signed)
Procedure Name: MAC Date/Time: 11/14/2021 9:24 AM Performed by: Dionne Bucy, CRNA Pre-anesthesia Checklist: Patient identified, Emergency Drugs available, Suction available, Patient being monitored and Timeout performed Patient Re-evaluated:Patient Re-evaluated prior to induction Oxygen Delivery Method: Nasal cannula Placement Confirmation: positive ETCO2

## 2021-11-14 NOTE — Transfer of Care (Signed)
Immediate Anesthesia Transfer of Care Note  Patient: John [name]  Procedure(s) Performed: CATARACT EXTRACTION PHACO AND INTRAOCULAR LENS PLACEMENT (IOC) RIGHT 8.59 01:10.5 (Right: Eye)  Patient Location: PACU  Anesthesia Type: MAC  Level of Consciousness: awake, alert  and patient cooperative  Airway and Oxygen Therapy: Patient Spontanous Breathing and Patient connected to supplemental oxygen  Post-op Assessment: Post-op Vital signs reviewed, Patient's Cardiovascular Status Stable, Respiratory Function Stable, Patent Airway and No signs of Nausea or vomiting  Post-op Vital Signs: Reviewed and stable  Complications: No notable events documented.

## 2021-11-19 DIAGNOSIS — N2581 Secondary hyperparathyroidism of renal origin: Secondary | ICD-10-CM | POA: Diagnosis not present

## 2021-11-19 DIAGNOSIS — I10 Essential (primary) hypertension: Secondary | ICD-10-CM | POA: Diagnosis not present

## 2021-11-19 DIAGNOSIS — I5022 Chronic systolic (congestive) heart failure: Secondary | ICD-10-CM | POA: Diagnosis not present

## 2021-11-19 DIAGNOSIS — R809 Proteinuria, unspecified: Secondary | ICD-10-CM | POA: Diagnosis not present

## 2021-11-19 DIAGNOSIS — N1832 Chronic kidney disease, stage 3b: Secondary | ICD-10-CM | POA: Diagnosis not present

## 2021-12-01 IMAGING — CR DG CHEST 2V
2 series · 2 of 2 positions shown · non-contrast
Comparison: 09/01/2019

CLINICAL DATA: Shortness of breath

EXAM:
CHEST - 2 VIEW

[chest pa]
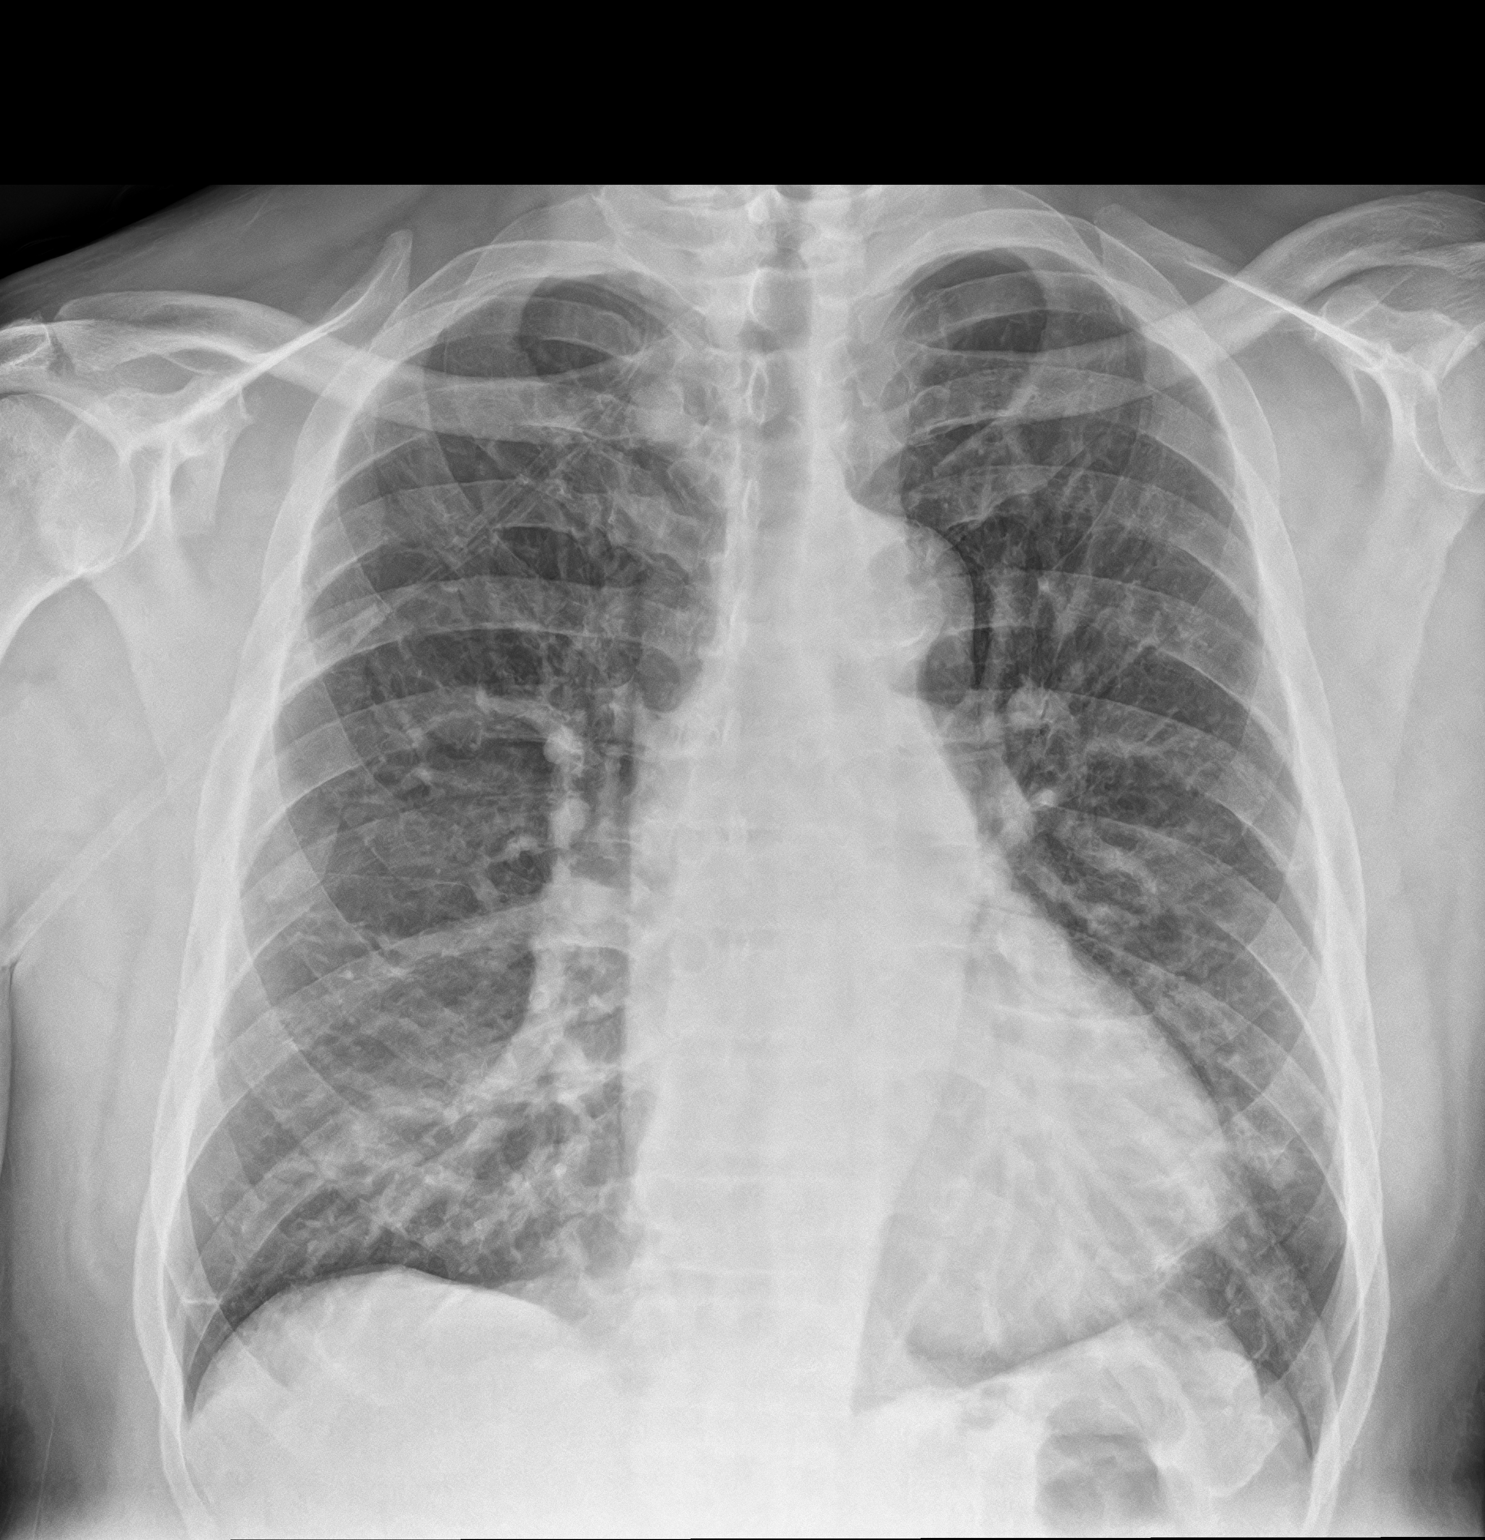

[chest lat]
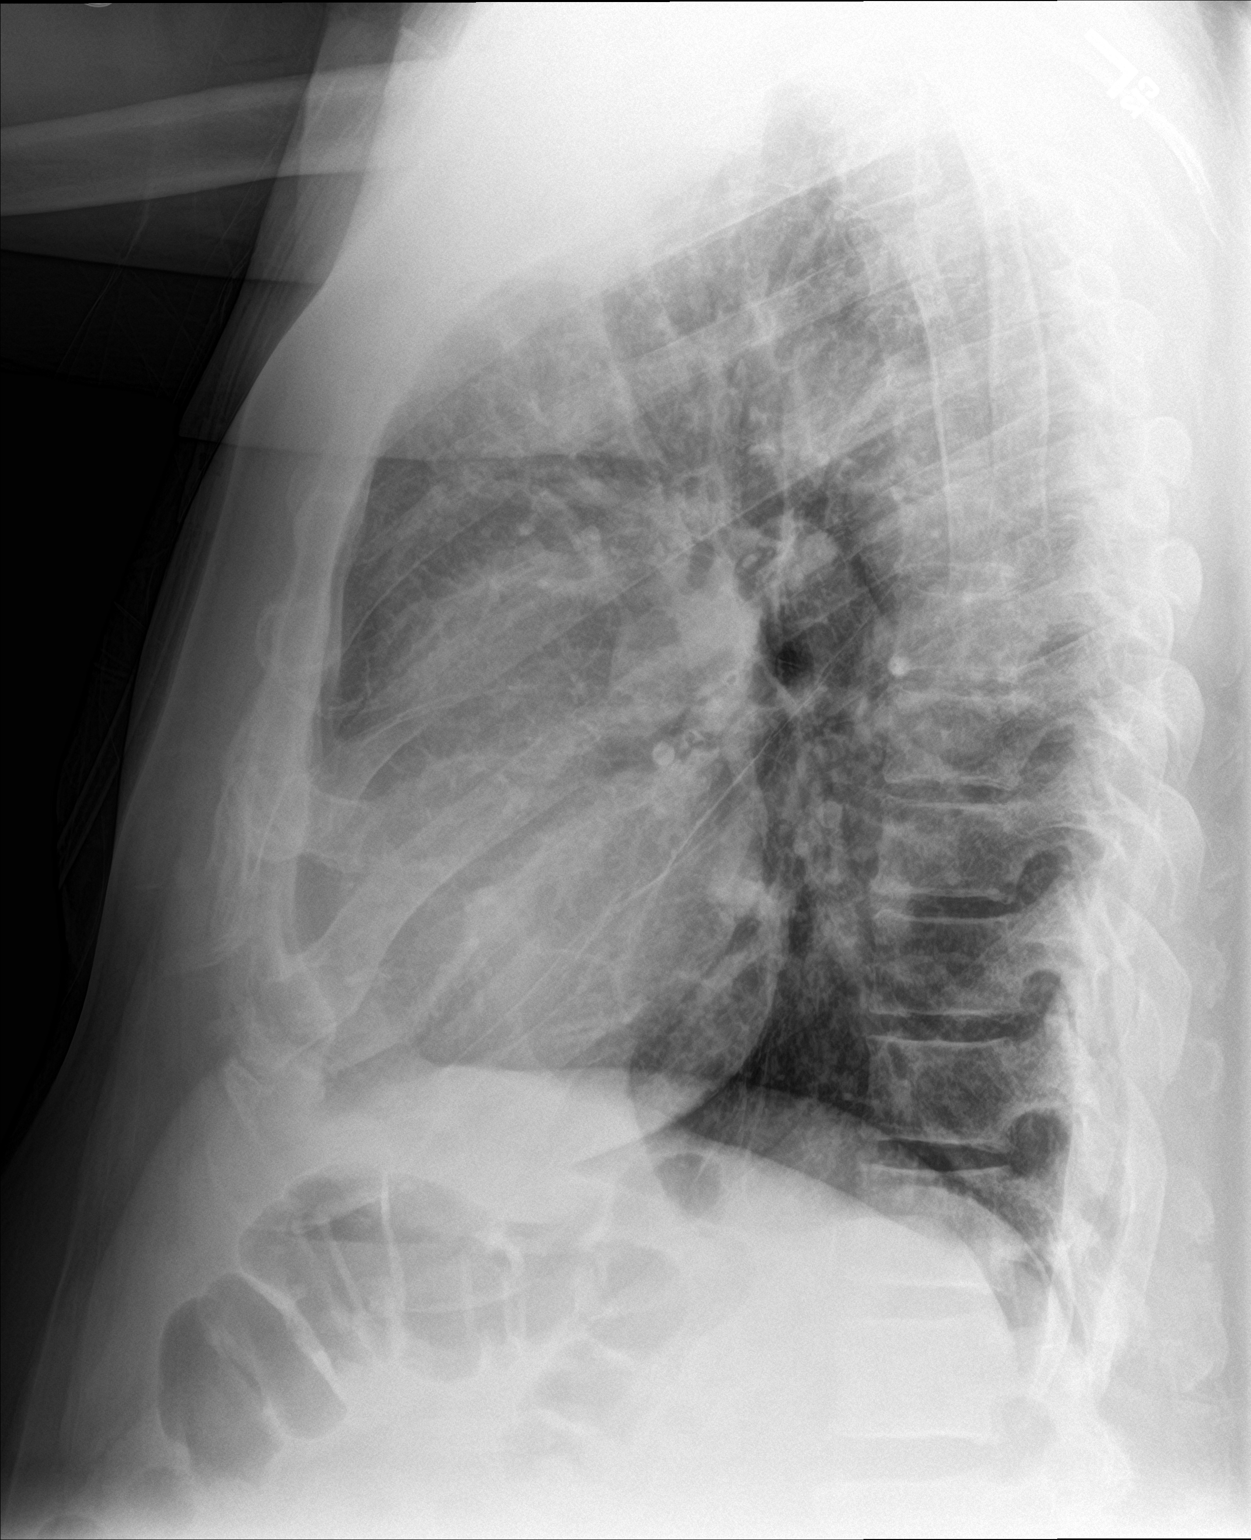

[2 of 2 positions shown; findings below may reference images not displayed]

FINDINGS: There is hyperinflation of the lungs compatible with COPD. No
confluent opacities or effusions. Heart is borderline in size. No
acute bony abnormality.
IMPRESSION: COPD.  No active disease.

## 2021-12-20 ENCOUNTER — Encounter: Payer: Self-pay | Admitting: Oncology

## 2021-12-20 ENCOUNTER — Other Ambulatory Visit: Payer: Self-pay

## 2021-12-20 ENCOUNTER — Inpatient Hospital Stay: Payer: Medicare HMO | Attending: Oncology | Admitting: Oncology

## 2021-12-20 ENCOUNTER — Inpatient Hospital Stay (HOSPITAL_BASED_OUTPATIENT_CLINIC_OR_DEPARTMENT_OTHER): Payer: Medicare HMO | Admitting: Oncology

## 2021-12-20 VITALS — BP 120/78 | HR 86 | Temp 98.1°F | Wt 231.0 lb

## 2021-12-20 DIAGNOSIS — D631 Anemia in chronic kidney disease: Secondary | ICD-10-CM | POA: Diagnosis not present

## 2021-12-20 DIAGNOSIS — D751 Secondary polycythemia: Secondary | ICD-10-CM

## 2021-12-20 DIAGNOSIS — R0602 Shortness of breath: Secondary | ICD-10-CM

## 2021-12-20 DIAGNOSIS — R5383 Other fatigue: Secondary | ICD-10-CM | POA: Diagnosis not present

## 2021-12-20 DIAGNOSIS — R0902 Hypoxemia: Secondary | ICD-10-CM | POA: Insufficient documentation

## 2021-12-20 DIAGNOSIS — Z833 Family history of diabetes mellitus: Secondary | ICD-10-CM | POA: Diagnosis not present

## 2021-12-20 DIAGNOSIS — R17 Unspecified jaundice: Secondary | ICD-10-CM

## 2021-12-20 DIAGNOSIS — Z87891 Personal history of nicotine dependence: Secondary | ICD-10-CM | POA: Diagnosis not present

## 2021-12-20 DIAGNOSIS — N1832 Chronic kidney disease, stage 3b: Secondary | ICD-10-CM

## 2021-12-20 DIAGNOSIS — D696 Thrombocytopenia, unspecified: Secondary | ICD-10-CM

## 2021-12-20 DIAGNOSIS — Z79899 Other long term (current) drug therapy: Secondary | ICD-10-CM | POA: Diagnosis not present

## 2021-12-20 DIAGNOSIS — D75839 Thrombocytosis, unspecified: Secondary | ICD-10-CM | POA: Diagnosis not present

## 2021-12-20 LAB — CBC WITH DIFFERENTIAL/PLATELET
Abs Immature Granulocytes: 0.03 10*3/uL (ref 0.00–0.07)
Basophils Absolute: 0 10*3/uL (ref 0.0–0.1)
Basophils Relative: 0 %
Eosinophils Absolute: 0.1 10*3/uL (ref 0.0–0.5)
Eosinophils Relative: 1 %
HCT: 60.3 % — ABNORMAL HIGH (ref 39.0–52.0)
Hemoglobin: 18.6 g/dL — ABNORMAL HIGH (ref 13.0–17.0)
Immature Granulocytes: 0 %
Lymphocytes Relative: 12 %
Lymphs Abs: 1 10*3/uL (ref 0.7–4.0)
MCH: 31.7 pg (ref 26.0–34.0)
MCHC: 30.8 g/dL (ref 30.0–36.0)
MCV: 102.9 fL — ABNORMAL HIGH (ref 80.0–100.0)
Monocytes Absolute: 0.7 10*3/uL (ref 0.1–1.0)
Monocytes Relative: 8 %
Neutro Abs: 6.5 10*3/uL (ref 1.7–7.7)
Neutrophils Relative %: 79 %
Platelets: 117 10*3/uL — ABNORMAL LOW (ref 150–400)
RBC: 5.86 MIL/uL — ABNORMAL HIGH (ref 4.22–5.81)
RDW: 13.2 % (ref 11.5–15.5)
WBC: 8.3 10*3/uL (ref 4.0–10.5)
nRBC: 0 % (ref 0.0–0.2)

## 2021-12-20 LAB — COMPREHENSIVE METABOLIC PANEL
ALT: 22 U/L (ref 0–44)
AST: 27 U/L (ref 15–41)
Albumin: 3.4 g/dL — ABNORMAL LOW (ref 3.5–5.0)
Alkaline Phosphatase: 72 U/L (ref 38–126)
Anion gap: 11 (ref 5–15)
BUN: 22 mg/dL (ref 8–23)
CO2: 37 mmol/L — ABNORMAL HIGH (ref 22–32)
Calcium: 9 mg/dL (ref 8.9–10.3)
Chloride: 94 mmol/L — ABNORMAL LOW (ref 98–111)
Creatinine, Ser: 1.87 mg/dL — ABNORMAL HIGH (ref 0.61–1.24)
GFR, Estimated: 37 mL/min — ABNORMAL LOW (ref >60)
Glucose, Bld: 141 mg/dL — ABNORMAL HIGH (ref 70–99)
Potassium: 3.3 mmol/L — ABNORMAL LOW (ref 3.5–5.1)
Sodium: 142 mmol/L (ref 135–145)
Total Bilirubin: 2.2 mg/dL — ABNORMAL HIGH (ref 0.3–1.2)
Total Protein: 6.3 g/dL — ABNORMAL LOW (ref 6.5–8.1)

## 2021-12-20 LAB — VITAMIN B12: Vitamin B-12: 446 pg/mL (ref 180–914)

## 2021-12-20 LAB — FOLATE: Folate: 10.8 ng/mL (ref >5.9)

## 2021-12-20 NOTE — Addendum Note (Signed)
Addended by: Earlie Server on: 12/20/2021 08:47 PM   Modules accepted: Orders

## 2021-12-20 NOTE — Progress Notes (Signed)
Hematology/Oncology Consult note [phone] [phone]   Patient Care Team: Tracie Harrier, MD as PCP - General (Internal Medicine)  REFERRING PROVIDER: Anthonette Legato, MD  CHIEF COMPLAINTS/REASON FOR VISIT:  Evaluation of erythrocytosis  HISTORY OF PRESENTING ILLNESS:  John [name] is a 76 y.o. male who was seen in consultation at the request of Anthonette Legato, MD for evaluation of polycytosis/erythrocytosis Reviewed patient's recent lab work which was obtain by PCP.  Labs showed elevated hemoglobin at 19.7, he has total white count, platelet count is decreased at 129,000. Chronic onset, patient has had borderline high hemoglobin in the past.  Hemoglobin recently significantly increased.  Chronic thrombocytosis as well. No aggravating or alleviating factors.   Associated signs or symptoms: Denies weight loss, fever, chills, fatigue, night sweats.   Context:  Smoking history: Patient is a former smoker. He denies any testosterone supplements He denies history of blood clots Patient mentioned that his fiance has mention to him that he stops breathing at night frequently. Patient has a history of CHF, recent echocardiogram on 06/21/2020 showed LVEF 45-50%. Also has a history of COPD. + Shortness of breath with exertion. He also reports that he wakes up frequently at night due to habit he gets from his previous occupation.   Review of Systems  Constitutional:  Positive for fatigue. Negative for appetite change, chills, fever and unexpected weight change.  HENT:   Negative for hearing loss and voice change.   Eyes:  Negative for eye problems and icterus.  Respiratory:  Positive for shortness of breath. Negative for chest tightness and cough.   Cardiovascular:  Negative for chest pain and leg swelling.  Gastrointestinal:  Negative for abdominal distention and abdominal pain.  Endocrine: Negative for hot flashes.  Genitourinary:  Negative for difficulty  urinating, dysuria and frequency.   Musculoskeletal:  Negative for arthralgias.  Skin:  Negative for itching and rash.  Neurological:  Negative for light-headedness and numbness.  Hematological:  Negative for adenopathy. Does not bruise/bleed easily.  Psychiatric/Behavioral:  Negative for confusion.    MEDICAL HISTORY:  Past Medical History:  Diagnosis Date   CHF (congestive heart failure) (HCC)    EF 45% in 2021   COPD (chronic obstructive pulmonary disease) ([place]) 02/16/2020   Hypertension     SURGICAL HISTORY: Past Surgical History:  Procedure Laterality Date   CATARACT EXTRACTION W/PHACO Left 10/31/2021   Procedure: CATARACT EXTRACTION PHACO AND INTRAOCULAR LENS PLACEMENT ([facility]) LEFT VISION BLUE 3.45 00:48.0;  Surgeon: Leandrew Koyanagi, MD;  Location: [facility];  Service: Ophthalmology;  Laterality: Left;   CATARACT EXTRACTION W/PHACO Right 11/14/2021   Procedure: CATARACT EXTRACTION PHACO AND INTRAOCULAR LENS PLACEMENT (I[facility]) RIGHT 8.59 01:10.5;  Surgeon: Leandrew Koyanagi, MD;  Location: [facility];  Service: Ophthalmology;  Laterality: Right;   INGUINAL HERNIA REPAIR Right 02/04/2017   Procedure: HERNIA REPAIR INGUINAL ADULT;  Surgeon: Leonie Green, MD;  Location: [facility];  Service: General;  Laterality: Right;   NO PAST SURGERIES     QUADRICEPS TENDON REPAIR Left 02/15/2020   Procedure: REPAIR QUADRICEP TENDON;  Surgeon: Corky Mull, MD;  Location: [facility];  Service: Orthopedics;  Laterality: Left;    SOCIAL HISTORY: Social History   Socioeconomic History   Marital status: Single    Spouse name: Not on file   Number of children: Not on file   Years of education: Not on file   Highest education level: Not on file  Occupational History   Not on file  Tobacco Use  Smoking status: Former    Types: Cigarettes    Quit date: 02/15/2015    Years since quitting: 6.8   Smokeless tobacco: Never  Substance and Sexual Activity   Alcohol use:  Yes    Comment: occassional   Drug use: No   Sexual activity: Not on file  Other Topics Concern   Not on file  Social History Narrative   Not on file   Social Determinants of Health   Financial Resource Strain: Not on file  Food Insecurity: Not on file  Transportation Needs: Not on file  Physical Activity: Not on file  Stress: Not on file  Social Connections: Not on file  Intimate Partner Violence: Not on file    FAMILY HISTORY: Family History  Problem Relation Age of Onset   Diabetes Mother    Diabetes Brother    Diabetes Maternal Grandmother    Diabetes Paternal Grandmother     ALLERGIES:  has No Known Allergies.  MEDICATIONS:  Current Outpatient Medications  Medication Sig Dispense Refill   aspirin 81 MG chewable tablet Chew 81 mg by mouth daily.     aspirin EC 325 MG tablet Take 1 tablet (325 mg total) by mouth daily. 30 tablet 0   brimonidine (ALPHAGAN) 0.2 % ophthalmic solution 1 drop 2 (two) times daily. 1 Drop(s) In Eye(s) Twice Daily     losartan (COZAAR) 100 MG tablet Take 100 mg by mouth daily.     torsemide (DEMADEX) 100 MG tablet Take 1 tablet by mouth. Take 1 tablet (100 mg total) by mouth daily     torsemide (DEMADEX) 100 MG tablet Take 1 tablet (100 mg total) by mouth daily. 30 tablet 0   No current facility-administered medications for this visit.     PHYSICAL EXAMINATION: ECOG PERFORMANCE STATUS: 1 - Symptomatic but completely ambulatory Vitals:   12/20/21 0931  BP: 120/78  Pulse: 86  Temp: 98.1 F (36.7 C)   Filed Weights   12/20/21 0931  Weight: 231 lb (104.8 kg)    Physical Exam Constitutional:      General: He is not in acute distress. HENT:     Head: Normocephalic and atraumatic.  Eyes:     General: No scleral icterus. Cardiovascular:     Rate and Rhythm: Normal rate and regular rhythm.     Heart sounds: Normal heart sounds.  Pulmonary:     Effort: Pulmonary effort is normal. No respiratory distress.     Breath sounds: No  wheezing.     Comments: Decreased breath sound bilaterally Abdominal:     General: Bowel sounds are normal. There is no distension.     Palpations: Abdomen is soft.  Musculoskeletal:        General: No deformity. Normal range of motion.     Cervical back: Normal range of motion and neck supple.  Skin:    General: Skin is warm and dry.     Findings: No erythema or rash.  Neurological:     Mental Status: He is alert and oriented to person, place, and time. Mental status is at baseline.     Cranial Nerves: No cranial nerve deficit.     Coordination: Coordination normal.  Psychiatric:        Mood and Affect: Mood normal.    RADIOGRAPHIC STUDIES: I have personally reviewed the radiological images as listed and agreed with the findings in the report. No results found.   LABORATORY DATA:  I have reviewed the data as listed Lab Results  Component Value Date   WBC 8.3 12/20/2021   HGB 18.6 (H) 12/20/2021   HCT 60.3 (H) 12/20/2021   MCV 102.9 (H) 12/20/2021   PLT 117 (L) 12/20/2021   Recent Labs    12/20/21 1014  NA 142  K 3.3*  CL 94*  CO2 37*  GLUCOSE 141*  BUN 22  CREATININE 1.87*  CALCIUM 9.0  GFRNONAA 37*  PROT 6.3*  ALBUMIN 3.4*  AST 27  ALT 22  ALKPHOS 72  BILITOT 2.2*   Iron/TIBC/Ferritin/ %Sat No results found for: IRON, TIBC, FERRITIN, IRONPCTSAT      ASSESSMENT & PLAN:  1. Erythrocytosis   2. Thrombocytopenia (HCC)   3. Elevated bilirubin   4. Anemia in stage 3b chronic kidney disease [facility])    #Erythrocytosis Labs are reviewed and discussed with patient. Erythrocytosis is an abnormal elevation of hemoglobin (Hgb) and/or hematocrit (Hct) in peripheral blood, and this can be caused by primary etiology, ie bone marrow mutation, or secondary etiology, ie hypoxia, smoking, androgen supplements, etc.  with the history patient provided, he most likely have a component of secondary erythrocytosis due to possible underlying hypoxia events.  Rule out other  secondary etiologies or primary etiologies I will obtain erythropoietin, carbo monoxide level, rule out primary etiology, JAK2 with reflex to other mutations, BCR-ABL1.   Recommend patient to discuss with primary care provider for evaluation of sleeping study.  Chronic thrombocytopenia, check folate, B12, myeloma panel. Chronic kidney disease, stage III, avoid nephrotoxin.  Encourage oral hydration.  Check myeloma panel. #Increased total bilirubin of 2.2, will check direct bilirubin.  Orders Placed This Encounter  Procedures   CBC with Differential/Platelet    Standing Status:   Future    Number of Occurrences:   1    Standing Expiration Date:   12/20/2022   Comprehensive metabolic panel    Standing Status:   Future    Number of Occurrences:   1    Standing Expiration Date:   12/20/2022   JAK2 V617F, w Reflex to CALR/E12/MPL    Standing Status:   Future    Number of Occurrences:   1    Standing Expiration Date:   12/20/2022   BCR-ABL1 FISH    Standing Status:   Future    Number of Occurrences:   1    Standing Expiration Date:   12/20/2022   Carbon monoxide, blood (performed at ref lab)    Standing Status:   Future    Number of Occurrences:   1    Standing Expiration Date:   12/20/2022   Erythropoietin    Standing Status:   Future    Number of Occurrences:   1    Standing Expiration Date:   12/20/2022   Multiple Myeloma Panel (SPEP&IFE w/QIG)    Standing Status:   Future    Number of Occurrences:   1    Standing Expiration Date:   12/20/2022   Kappa/lambda light chains    Standing Status:   Future    Number of Occurrences:   1    Standing Expiration Date:   12/20/2022   Folate    Standing Status:   Future    Number of Occurrences:   1    Standing Expiration Date:   12/20/2022   Vitamin B12    Standing Status:   Future    Number of Occurrences:   1    Standing Expiration Date:   12/20/2022    We spent sufficient time to  discuss many aspect of care, questions were answered to patient's  satisfaction. The patient knows to call the clinic with any problems questions or concerns.  Cc Holley Raring, [place], MD  Return of visit: 3 weeks Thank you for this kind referral and the opportunity to participate in the care of this patient. A copy of today's note is routed to referring provider    Earlie Server, MD, PhD 12/20/2021

## 2021-12-21 ENCOUNTER — Telehealth: Payer: Self-pay

## 2021-12-21 DIAGNOSIS — D696 Thrombocytopenia, unspecified: Secondary | ICD-10-CM

## 2021-12-21 LAB — ERYTHROPOIETIN: Erythropoietin: 26.1 m[IU]/mL — ABNORMAL HIGH (ref 2.6–18.5)

## 2021-12-21 LAB — KAPPA/LAMBDA LIGHT CHAINS
Kappa free light chain: 55 mg/L — ABNORMAL HIGH (ref 3.3–19.4)
Kappa, lambda light chain ratio: 1.34 (ref 0.26–1.65)
Lambda free light chains: 41.1 mg/L — ABNORMAL HIGH (ref 5.7–26.3)

## 2021-12-21 LAB — CARBON MONOXIDE, BLOOD (PERFORMED AT REF LAB): Carbon Monoxide, Blood: 3.8 % — ABNORMAL HIGH (ref 0.0–3.6)

## 2021-12-21 NOTE — Progress Notes (Signed)
Message sent to lab

## 2021-12-21 NOTE — Telephone Encounter (Signed)
-----   Message from Earlie Server, MD sent at 12/20/2021  8:35 PM EST ----- Please add direct bilirubin to his labs.  Let him know that blood level is persistently I recommend him to have 1 phlebotomy. Please arrange if he agrees.  Add lab encounter of cbc + phlebotomy when he comes back to see me thanks. Ordered

## 2021-12-24 ENCOUNTER — Encounter: Payer: Self-pay | Admitting: Oncology

## 2021-12-24 LAB — MULTIPLE MYELOMA PANEL, SERUM
Albumin SerPl Elph-Mcnc: 3.3 g/dL (ref 2.9–4.4)
Albumin/Glob SerPl: 1.3 (ref 0.7–1.7)
Alpha 1: 0.3 g/dL (ref 0.0–0.4)
Alpha2 Glob SerPl Elph-Mcnc: 0.5 g/dL (ref 0.4–1.0)
B-Globulin SerPl Elph-Mcnc: 1 g/dL (ref 0.7–1.3)
Gamma Glob SerPl Elph-Mcnc: 0.9 g/dL (ref 0.4–1.8)
Globulin, Total: 2.6 g/dL (ref 2.2–3.9)
IgA: 283 mg/dL (ref 61–437)
IgG (Immunoglobin G), Serum: 978 mg/dL (ref 603–1613)
IgM (Immunoglobulin M), Srm: 28 mg/dL (ref 15–143)
Total Protein ELP: 5.9 g/dL — ABNORMAL LOW (ref 6.0–8.5)

## 2021-12-24 LAB — BCR-ABL1 FISH
Cells Analyzed: 200
Cells Counted: 200

## 2021-12-26 ENCOUNTER — Inpatient Hospital Stay: Payer: Medicare HMO

## 2021-12-26 ENCOUNTER — Other Ambulatory Visit: Payer: Self-pay

## 2021-12-26 NOTE — Patient Instructions (Signed)
Therapeutic Phlebotomy, Care After ?The following information offers guidance on how to care for yourself after your procedure. Your health care provider may also give you more specific instructions. If you have problems or questions, contact your health care provider. ?What can I expect after the procedure? ?After therapeutic phlebotomy, it is common to have: ?Light-headedness or dizziness. You may feel faint. ?Nausea. ?Tiredness (fatigue). ?Follow these instructions at home: ?Eating and drinking ?Be sure to eat well-balanced meals for the next 24 hours. ?Drink enough fluid to keep your urine pale yellow. ?Avoid drinking alcohol on the day that you had the procedure. ?Activity ? ?Return to your normal activities as told by your health care provider. Most people can go back to their normal activities right away. ?Avoid activities that take a lot of effort for about 5 hours after the procedure. Athletes should avoid strenuous exercise for at least 12 hours. ?Avoid heavy lifting or pulling for about 5 hours after the procedure. Do not lift anything that is heavier than 10 lb (4.5 kg). ?Change positions slowly for the remainder of the day, like from sitting to standing. This can help prevent light-headedness or fainting. ?If you feel light-headed, lie down until the feeling goes away. ?Needle insertion site care ? ?Keep your bandage (dressing) dry. You can remove the bandage after about 5 hours or as told by your health care provider. ?If you have bleeding from the needle insertion site, raise (elevate) your arm and press firmly on the site until the bleeding stops. ?If you have bruising at the site, apply ice to the area. To do this: ?Put ice in a plastic bag. ?Place a towel between your skin and the bag. ?Leave the ice on for 20 minutes, 2-3 times a day for the first 24 hours. ?Remove the ice if your skin turns bright red so you do not damage the area. ?If the swelling does not go away after 24 hours, apply a warm,  moist cloth (warm compress) to the area for 20 minutes, 2-3 times a day. ?General instructions ?Do not use any products that contain nicotine or tobacco, like cigarettes, chewing tobacco, and vaping devices, such as e-cigarettes, for at least 30 minutes after the procedure. If you need help quitting, ask your health care provider. ?Keep all follow-up visits. You may need to continue having regular blood tests and therapeutic phlebotomy treatments as directed. ?Contact a health care provider if: ?You have redness, swelling, or pain at the needle insertion site. ?Fluid or blood is coming from the needle insertion site. ?Pus or a bad smell is coming from the needle insertion site. ?The needle insertion site feels warm to the touch. ?You feel light-headed, dizzy, or nauseous, and the feeling does not go away. ?You have new bruising at the needle insertion site. ?You feel weaker than normal. ?You have a fever or chills. ?Get help right away if: ?You have chest pain. ?You have trouble breathing. ?You have severe nausea or vomiting. ?Summary ?After the procedure, it is common to have some light-headedness, dizziness, nausea, or tiredness (fatigue). ?Be sure to eat well-balanced meals for the next 24 hours. Drink enough fluid to keep your urine pale yellow. ?Return to your normal activities as told by your health care provider. ?Keep all follow-up visits. You may need to continue having regular blood tests and therapeutic phlebotomy treatments as directed. ?This information is not intended to replace advice given to you by your health care provider. Make sure you discuss any questions you have   with your health care provider. ?Document Revised: 05/30/2021 Document Reviewed: 05/30/2021 ?Elsevier Patient Education ? 2022 [employer] ? ?

## 2021-12-26 NOTE — Progress Notes (Unsigned)
Pt completed his first phlebotomy today. 250 ml of blood removed per MD orders. Tolerated treatment well. VSS post procedure. Observation time completed.Discharged to home after accepting a beverage and cookies.

## 2021-12-28 ENCOUNTER — Encounter: Payer: Self-pay | Admitting: Oncology

## 2022-01-03 LAB — CALR + JAK2 E12-15 + MPL (REFLEXED)

## 2022-01-03 LAB — JAK2 V617F, W REFLEX TO CALR/E12/MPL

## 2022-01-11 ENCOUNTER — Inpatient Hospital Stay: Payer: Medicare HMO

## 2022-01-11 ENCOUNTER — Other Ambulatory Visit: Payer: Medicare HMO

## 2022-01-11 ENCOUNTER — Inpatient Hospital Stay (HOSPITAL_BASED_OUTPATIENT_CLINIC_OR_DEPARTMENT_OTHER): Payer: Medicare HMO | Admitting: Oncology

## 2022-01-11 ENCOUNTER — Other Ambulatory Visit: Payer: Self-pay

## 2022-01-11 ENCOUNTER — Ambulatory Visit: Payer: Medicare HMO | Admitting: Oncology

## 2022-01-11 ENCOUNTER — Encounter: Payer: Self-pay | Admitting: Oncology

## 2022-01-11 VITALS — BP 113/80 | HR 102 | Temp 98.7°F | Resp 17 | Wt 229.3 lb

## 2022-01-11 VITALS — BP 106/72 | HR 92 | Resp 18

## 2022-01-11 DIAGNOSIS — R0602 Shortness of breath: Secondary | ICD-10-CM | POA: Diagnosis not present

## 2022-01-11 DIAGNOSIS — D696 Thrombocytopenia, unspecified: Secondary | ICD-10-CM

## 2022-01-11 DIAGNOSIS — D751 Secondary polycythemia: Secondary | ICD-10-CM

## 2022-01-11 DIAGNOSIS — R17 Unspecified jaundice: Secondary | ICD-10-CM

## 2022-01-11 DIAGNOSIS — N1832 Chronic kidney disease, stage 3b: Secondary | ICD-10-CM

## 2022-01-11 DIAGNOSIS — R5383 Other fatigue: Secondary | ICD-10-CM | POA: Diagnosis not present

## 2022-01-11 DIAGNOSIS — D631 Anemia in chronic kidney disease: Secondary | ICD-10-CM | POA: Diagnosis not present

## 2022-01-11 DIAGNOSIS — R0902 Hypoxemia: Secondary | ICD-10-CM | POA: Diagnosis not present

## 2022-01-11 DIAGNOSIS — D75839 Thrombocytosis, unspecified: Secondary | ICD-10-CM | POA: Diagnosis not present

## 2022-01-11 LAB — CBC WITH DIFFERENTIAL/PLATELET
Abs Immature Granulocytes: 0.02 10*3/uL (ref 0.00–0.07)
Basophils Absolute: 0 10*3/uL (ref 0.0–0.1)
Basophils Relative: 0 %
Eosinophils Absolute: 0.1 10*3/uL (ref 0.0–0.5)
Eosinophils Relative: 1 %
HCT: 60.3 % — ABNORMAL HIGH (ref 39.0–52.0)
Hemoglobin: 18.4 g/dL — ABNORMAL HIGH (ref 13.0–17.0)
Immature Granulocytes: 0 %
Lymphocytes Relative: 14 %
Lymphs Abs: 1.3 10*3/uL (ref 0.7–4.0)
MCH: 31.5 pg (ref 26.0–34.0)
MCHC: 30.5 g/dL (ref 30.0–36.0)
MCV: 103.3 fL — ABNORMAL HIGH (ref 80.0–100.0)
Monocytes Absolute: 0.9 10*3/uL (ref 0.1–1.0)
Monocytes Relative: 9 %
Neutro Abs: 7.1 10*3/uL (ref 1.7–7.7)
Neutrophils Relative %: 76 %
Platelets: 120 10*3/uL — ABNORMAL LOW (ref 150–400)
RBC: 5.84 MIL/uL — ABNORMAL HIGH (ref 4.22–5.81)
RDW: 13.8 % (ref 11.5–15.5)
WBC: 9.4 10*3/uL (ref 4.0–10.5)
nRBC: 0 % (ref 0.0–0.2)

## 2022-01-11 LAB — BILIRUBIN, DIRECT: Bilirubin, Direct: 0.5 mg/dL — ABNORMAL HIGH (ref 0.0–0.2)

## 2022-01-11 NOTE — Progress Notes (Signed)
Pt here for Therapeutic phlebotomy. Removed 250 ml of blood per MD orders. Tolerated procedure well. VSS. Discharged to home feeling well.

## 2022-01-11 NOTE — Patient Instructions (Signed)
Therapeutic Phlebotomy, Care After ?The following information offers guidance on how to care for yourself after your procedure. Your health care provider may also give you more specific instructions. If you have problems or questions, contact your health care provider. ?What can I expect after the procedure? ?After therapeutic phlebotomy, it is common to have: ?Light-headedness or dizziness. You may feel faint. ?Nausea. ?Tiredness (fatigue). ?Follow these instructions at home: ?Eating and drinking ?Be sure to eat well-balanced meals for the next 24 hours. ?Drink enough fluid to keep your urine pale yellow. ?Avoid drinking alcohol on the day that you had the procedure. ?Activity ? ?Return to your normal activities as told by your health care provider. Most people can go back to their normal activities right away. ?Avoid activities that take a lot of effort for about 5 hours after the procedure. Athletes should avoid strenuous exercise for at least 12 hours. ?Avoid heavy lifting or pulling for about 5 hours after the procedure. Do not lift anything that is heavier than 10 lb (4.5 kg). ?Change positions slowly for the remainder of the day, like from sitting to standing. This can help prevent light-headedness or fainting. ?If you feel light-headed, lie down until the feeling goes away. ?Needle insertion site care ? ?Keep your bandage (dressing) dry. You can remove the bandage after about 5 hours or as told by your health care provider. ?If you have bleeding from the needle insertion site, raise (elevate) your arm and press firmly on the site until the bleeding stops. ?If you have bruising at the site, apply ice to the area. To do this: ?Put ice in a plastic bag. ?Place a towel between your skin and the bag. ?Leave the ice on for 20 minutes, 2-3 times a day for the first 24 hours. ?Remove the ice if your skin turns bright red so you do not damage the area. ?If the swelling does not go away after 24 hours, apply a warm,  moist cloth (warm compress) to the area for 20 minutes, 2-3 times a day. ?General instructions ?Do not use any products that contain nicotine or tobacco, like cigarettes, chewing tobacco, and vaping devices, such as e-cigarettes, for at least 30 minutes after the procedure. If you need help quitting, ask your health care provider. ?Keep all follow-up visits. You may need to continue having regular blood tests and therapeutic phlebotomy treatments as directed. ?Contact a health care provider if: ?You have redness, swelling, or pain at the needle insertion site. ?Fluid or blood is coming from the needle insertion site. ?Pus or a bad smell is coming from the needle insertion site. ?The needle insertion site feels warm to the touch. ?You feel light-headed, dizzy, or nauseous, and the feeling does not go away. ?You have new bruising at the needle insertion site. ?You feel weaker than normal. ?You have a fever or chills. ?Get help right away if: ?You have chest pain. ?You have trouble breathing. ?You have severe nausea or vomiting. ?Summary ?After the procedure, it is common to have some light-headedness, dizziness, nausea, or tiredness (fatigue). ?Be sure to eat well-balanced meals for the next 24 hours. Drink enough fluid to keep your urine pale yellow. ?Return to your normal activities as told by your health care provider. ?Keep all follow-up visits. You may need to continue having regular blood tests and therapeutic phlebotomy treatments as directed. ?This information is not intended to replace advice given to you by your health care provider. Make sure you discuss any questions you have   with your health care provider. ?Document Revised: 05/30/2021 Document Reviewed: 05/30/2021 ?Elsevier Patient Education ? 2022 [employer] ? ?

## 2022-01-11 NOTE — Progress Notes (Signed)
Hematology/Oncology progress note [phone] [phone]   Patient Care Team: Tracie Harrier, MD as PCP - General (Internal Medicine)  REFERRING PROVIDER: Tracie Harrier, MD  CHIEF COMPLAINTS/REASON FOR VISIT:  Follow-up for erythrocytosis  HISTORY OF PRESENTING ILLNESS:  John [name] is a 76 y.o. male who was seen in consultation at the request of Tracie Harrier, MD for evaluation of polycytosis/erythrocytosis Reviewed patient's recent lab work which was obtain by PCP.  Labs showed elevated hemoglobin at 19.7, he has total white count, platelet count is decreased at 129,000. Chronic onset, patient has had borderline high hemoglobin in the past.  Hemoglobin recently significantly increased.  Chronic thrombocytosis as well. No aggravating or alleviating factors.   Associated signs or symptoms: Denies weight loss, fever, chills, fatigue, night sweats.   Context:  Smoking history: Patient is a former smoker. He denies any testosterone supplements He denies history of blood clots Patient mentioned that his fiance has mention to him that he stops breathing at night frequently. Patient has a history of CHF, recent echocardiogram on 06/21/2020 showed LVEF 45-50%. Also has a history of COPD. + Shortness of breath with exertion. He also reports that he wakes up frequently at night due to habit he gets from his previous occupation.   INTERVAL HISTORY John [name] is a 76 y.o. male who has above history reviewed by me today presents for follow up visit for management of erythrocytosis. Patient has received 1 phlebotomy session in the interval.  He tolerates well.  He has chronic shortness of breath worsening with exertion.  Today in the clinic his pulse ox was 85% and the patient reported that this is normal for him.  Review of Systems  Constitutional:  Positive for fatigue. Negative for appetite change, chills, fever and unexpected weight change.  HENT:    Negative for hearing loss and voice change.   Eyes:  Negative for eye problems and icterus.  Respiratory:  Positive for shortness of breath. Negative for chest tightness and cough.   Cardiovascular:  Negative for chest pain and leg swelling.  Gastrointestinal:  Negative for abdominal distention and abdominal pain.  Endocrine: Negative for hot flashes.  Genitourinary:  Negative for difficulty urinating, dysuria and frequency.   Musculoskeletal:  Negative for arthralgias.  Skin:  Negative for itching and rash.  Neurological:  Negative for light-headedness and numbness.  Hematological:  Negative for adenopathy. Does not bruise/bleed easily.  Psychiatric/Behavioral:  Negative for confusion.    MEDICAL HISTORY:  Past Medical History:  Diagnosis Date   CHF (congestive heart failure) (HCC)    EF 45% in 2021   COPD (chronic obstructive pulmonary disease) ([place]) 02/16/2020   Hypertension     SURGICAL HISTORY: Past Surgical History:  Procedure Laterality Date   CATARACT EXTRACTION W/PHACO Left 10/31/2021   Procedure: CATARACT EXTRACTION PHACO AND INTRAOCULAR LENS PLACEMENT ([place]) LEFT VISION BLUE 3.45 00:48.0;  Surgeon: Leandrew Koyanagi, MD;  Location: [place];  Service: Ophthalmology;  Laterality: Left;   CATARACT EXTRACTION W/PHACO Right 11/14/2021   Procedure: CATARACT EXTRACTION PHACO AND INTRAOCULAR LENS PLACEMENT ([facility]) RIGHT 8.59 01:10.5;  Surgeon: Leandrew Koyanagi, MD;  Location: [facility];  Service: Ophthalmology;  Laterality: Right;   INGUINAL HERNIA REPAIR Right 02/04/2017   Procedure: HERNIA REPAIR INGUINAL ADULT;  Surgeon: Leonie Green, MD;  Location: [facility];  Service: General;  Laterality: Right;   NO PAST SURGERIES     QUADRICEPS TENDON REPAIR Left 02/15/2020   Procedure: REPAIR QUADRICEP TENDON;  Surgeon: Corky Mull,  MD;  Location: [facility];  Service: Orthopedics;  Laterality: Left;    SOCIAL HISTORY: Social History   Socioeconomic  History   Marital status: Single    Spouse name: Not on file   Number of children: Not on file   Years of education: Not on file   Highest education level: Not on file  Occupational History   Not on file  Tobacco Use   Smoking status: Former    Packs/day: 0.25    Years: 53.00    Pack years: 13.25    Types: Cigarettes    Quit date: 02/15/2015    Years since quitting: 6.9   Smokeless tobacco: Never  Substance and Sexual Activity   Alcohol use: Yes    Comment: occassional   Drug use: No   Sexual activity: Not on file  Other Topics Concern   Not on file  Social History Narrative   Not on file   Social Determinants of Health   Financial Resource Strain: Not on file  Food Insecurity: Not on file  Transportation Needs: Not on file  Physical Activity: Not on file  Stress: Not on file  Social Connections: Not on file  Intimate Partner Violence: Not on file    FAMILY HISTORY: Family History  Problem Relation Age of Onset   Diabetes Mother    Diabetes Brother    Diabetes Maternal Grandmother    Diabetes Paternal Grandmother     ALLERGIES:  has No Known Allergies.  MEDICATIONS:  Current Outpatient Medications  Medication Sig Dispense Refill   aspirin 81 MG chewable tablet Chew 81 mg by mouth daily.     aspirin EC 325 MG tablet Take 1 tablet (325 mg total) by mouth daily. 30 tablet 0   losartan (COZAAR) 100 MG tablet Take 100 mg by mouth daily.     torsemide (DEMADEX) 100 MG tablet Take 1 tablet (100 mg total) by mouth daily. 30 tablet 0   torsemide (DEMADEX) 100 MG tablet Take 1 tablet by mouth. Take 1 tablet (100 mg total) by mouth daily     brimonidine (ALPHAGAN) 0.2 % ophthalmic solution 1 drop 2 (two) times daily. 1 Drop(s) In Eye(s) Twice Daily (Patient not taking: Reported on 01/11/2022)     No current facility-administered medications for this visit.     PHYSICAL EXAMINATION: ECOG PERFORMANCE STATUS: 1 - Symptomatic but completely ambulatory Vitals:    01/11/22 0844  BP: 113/80  Pulse: (!) 102  Resp: 17  Temp: 98.7 F (37.1 C)  SpO2: (!) 85%   Filed Weights   01/11/22 0844  Weight: 229 lb 4.8 oz (104 kg)    Physical Exam Constitutional:      General: He is not in acute distress. HENT:     Head: Normocephalic and atraumatic.  Eyes:     General: No scleral icterus. Cardiovascular:     Rate and Rhythm: Normal rate and regular rhythm.     Heart sounds: Normal heart sounds.  Pulmonary:     Effort: Pulmonary effort is normal. No respiratory distress.     Breath sounds: No wheezing.     Comments: Decreased breath sound bilaterally Abdominal:     General: Bowel sounds are normal. There is no distension.     Palpations: Abdomen is soft.  Musculoskeletal:        General: No deformity. Normal range of motion.     Cervical back: Normal range of motion and neck supple.     Right lower leg: Edema present.  Left lower leg: Edema present.  Skin:    General: Skin is warm and dry.     Findings: No erythema or rash.  Neurological:     Mental Status: He is alert and oriented to person, place, and time. Mental status is at baseline.     Cranial Nerves: No cranial nerve deficit.     Coordination: Coordination normal.  Psychiatric:        Mood and Affect: Mood normal.    RADIOGRAPHIC STUDIES: I have personally reviewed the radiological images as listed and agreed with the findings in the report. No results found.   LABORATORY DATA:  I have reviewed the data as listed Lab Results  Component Value Date   WBC 9.4 01/11/2022   HGB 18.4 (H) 01/11/2022   HCT 60.3 (H) 01/11/2022   MCV 103.3 (H) 01/11/2022   PLT 120 (L) 01/11/2022   Recent Labs    12/20/21 1014 01/11/22 0816  NA 142  --   K 3.3*  --   CL 94*  --   CO2 37*  --   GLUCOSE 141*  --   BUN 22  --   CREATININE 1.87*  --   CALCIUM 9.0  --   GFRNONAA 37*  --   PROT 6.3*  --   ALBUMIN 3.4*  --   AST 27  --   ALT 22  --   ALKPHOS 72  --   BILITOT 2.2*  --    BILIDIR  --  0.5*    Iron/TIBC/Ferritin/ %Sat No results found for: IRON, TIBC, FERRITIN, IRONPCTSAT      ASSESSMENT & PLAN:  1. Erythrocytosis   2. Thrombocytopenia ([place])   3. Stage 3b chronic kidney disease ([place])    #Erythrocytosis Labs reviewed and discussed with patient. JAK2 V617F mutation negative, with reflex to other mutations CALR, MPL, JAK 2 Ex 12-15 mutations negative. BCR ABL 1 FISH negative.  Increased carbon monoxide level, Significant increase of erythropoietin level. This is less likely primary erythrocytosis.  Likely secondary erythrocytosis, due to hypoxia, or other EPO producing tumors. Patient's former smoker.  I will obtain CT chest abdomen pelvis without contrast for further evaluation. Today's hemoglobin is 18 and HCT 60.  Proceed with another session of phlebotomy.  Hypoxia, patient has history of CHF, COPD.  Asked patient to follow-up with primary care provider to see if home oxygen is needed Chronic thrombocytopenia, normal vitamin B12 and folate level. Chronic kidney disease, stage III, avoid nephrotoxin.  Encourage oral hydration.  Check myeloma panel. #Increased total bilirubin of 2.2,  direct bilirubin 0.5.  Orders Placed This Encounter  Procedures   CT CHEST ABDOMEN PELVIS WO CONTRAST    Standing Status:   Future    Standing Expiration Date:   01/11/2023    Order Specific Question:   If indicated for the ordered procedure, I authorize the administration of contrast media per Radiology protocol    Answer:   Yes    Order Specific Question:   Preferred imaging location?    Answer:   [facility]    Order Specific Question:   Is Oral Contrast requested for this exam?    Answer:   Yes, Per Radiology protocol   CBC with Differential/Platelet    Standing Status:   Future    Standing Expiration Date:   01/11/2023    We spent sufficient time to discuss many aspect of care, questions were answered to patient's satisfaction. The patient knows to  call the clinic with  any problems questions or concerns.  Cc Hande, Vishwanath, MD  Return of visit: A few days after CT scan to review results, repeat CBC +/- additional phlebotomy sessions.  Earlie Server, MD, PhD 01/11/2022

## 2022-01-17 DIAGNOSIS — I5022 Chronic systolic (congestive) heart failure: Secondary | ICD-10-CM | POA: Diagnosis not present

## 2022-01-17 DIAGNOSIS — N1831 Chronic kidney disease, stage 3a: Secondary | ICD-10-CM | POA: Diagnosis not present

## 2022-01-17 DIAGNOSIS — R809 Proteinuria, unspecified: Secondary | ICD-10-CM | POA: Diagnosis not present

## 2022-01-17 DIAGNOSIS — N2581 Secondary hyperparathyroidism of renal origin: Secondary | ICD-10-CM | POA: Diagnosis not present

## 2022-01-17 DIAGNOSIS — I10 Essential (primary) hypertension: Secondary | ICD-10-CM | POA: Diagnosis not present

## 2022-01-18 ENCOUNTER — Ambulatory Visit
Admission: RE | Admit: 2022-01-18 | Discharge: 2022-01-18 | Disposition: A | Discharge status: Home / Self Care | Payer: Medicare HMO | Source: Ambulatory Visit | Attending: Oncology | Admitting: Oncology

## 2022-01-18 ENCOUNTER — Other Ambulatory Visit: Payer: Self-pay

## 2022-01-18 DIAGNOSIS — I7143 Infrarenal abdominal aortic aneurysm, without rupture: Secondary | ICD-10-CM | POA: Insufficient documentation

## 2022-01-18 DIAGNOSIS — N401 Enlarged prostate with lower urinary tract symptoms: Secondary | ICD-10-CM | POA: Insufficient documentation

## 2022-01-18 DIAGNOSIS — I2510 Atherosclerotic heart disease of native coronary artery without angina pectoris: Secondary | ICD-10-CM | POA: Diagnosis not present

## 2022-01-18 DIAGNOSIS — K5730 Diverticulosis of large intestine without perforation or abscess without bleeding: Secondary | ICD-10-CM | POA: Insufficient documentation

## 2022-01-18 DIAGNOSIS — D696 Thrombocytopenia, unspecified: Secondary | ICD-10-CM | POA: Insufficient documentation

## 2022-01-18 DIAGNOSIS — J439 Emphysema, unspecified: Secondary | ICD-10-CM | POA: Insufficient documentation

## 2022-01-21 ENCOUNTER — Ambulatory Visit
Admission: RE | Admit: 2022-01-21 | Discharge: 2022-01-21 | Disposition: A | Discharge status: Home / Self Care | Payer: Medicare HMO | Source: Ambulatory Visit | Attending: Nephrology | Admitting: Nephrology

## 2022-01-21 ENCOUNTER — Other Ambulatory Visit: Payer: Self-pay

## 2022-01-21 DIAGNOSIS — N1831 Chronic kidney disease, stage 3a: Secondary | ICD-10-CM | POA: Insufficient documentation

## 2022-01-21 DIAGNOSIS — R609 Edema, unspecified: Secondary | ICD-10-CM | POA: Diagnosis not present

## 2022-01-21 MED ORDER — POTASSIUM CHLORIDE CRYS ER 20 MEQ PO TBCR
EXTENDED_RELEASE_TABLET | ORAL | Status: AC
  Administered 2022-01-21: 20 meq via ORAL
  Filled 2022-01-21: qty 1

## 2022-01-21 MED ORDER — POTASSIUM CHLORIDE CRYS ER 20 MEQ PO TBCR: 20.0000 meq | EXTENDED_RELEASE_TABLET | Freq: Once | ORAL | Status: AC

## 2022-01-21 MED ORDER — FUROSEMIDE 10 MG/ML IJ SOLN
INTRAMUSCULAR | Status: AC
  Administered 2022-01-21: 80 mg via INTRAVENOUS
  Filled 2022-01-21: qty 8

## 2022-01-21 MED ORDER — FUROSEMIDE 10 MG/ML IJ SOLN: 80.0000 mg | Freq: Once | INTRAMUSCULAR | Status: AC

## 2022-01-23 ENCOUNTER — Ambulatory Visit
Admission: RE | Admit: 2022-01-23 | Discharge: 2022-01-23 | Disposition: A | Discharge status: Home / Self Care | Payer: Medicare HMO | Source: Ambulatory Visit | Attending: Nephrology | Admitting: Nephrology

## 2022-01-23 DIAGNOSIS — N1831 Chronic kidney disease, stage 3a: Secondary | ICD-10-CM | POA: Insufficient documentation

## 2022-01-23 DIAGNOSIS — R609 Edema, unspecified: Secondary | ICD-10-CM | POA: Insufficient documentation

## 2022-01-23 MED ORDER — POTASSIUM CHLORIDE CRYS ER 20 MEQ PO TBCR
20.0000 meq | EXTENDED_RELEASE_TABLET | Freq: Once | ORAL | Status: AC
  Administered 2022-01-23: 20 meq via ORAL

## 2022-01-23 MED ORDER — FUROSEMIDE 10 MG/ML IJ SOLN
80.0000 mg | Freq: Once | INTRAMUSCULAR | Status: AC
  Administered 2022-01-23: 80 mg via INTRAVENOUS

## 2022-01-25 ENCOUNTER — Ambulatory Visit
Admission: RE | Admit: 2022-01-25 | Discharge: 2022-01-25 | Disposition: A | Discharge status: Home / Self Care | Payer: Medicare HMO | Source: Ambulatory Visit | Attending: Nephrology | Admitting: Nephrology

## 2022-01-25 DIAGNOSIS — I130 Hypertensive heart and chronic kidney disease with heart failure and stage 1 through stage 4 chronic kidney disease, or unspecified chronic kidney disease: Secondary | ICD-10-CM | POA: Diagnosis present

## 2022-01-25 DIAGNOSIS — Z7982 Long term (current) use of aspirin: Secondary | ICD-10-CM | POA: Diagnosis not present

## 2022-01-25 DIAGNOSIS — I714 Abdominal aortic aneurysm, without rupture, unspecified: Secondary | ICD-10-CM | POA: Diagnosis present

## 2022-01-25 DIAGNOSIS — N179 Acute kidney failure, unspecified: Secondary | ICD-10-CM | POA: Diagnosis present

## 2022-01-25 DIAGNOSIS — N1832 Chronic kidney disease, stage 3b: Secondary | ICD-10-CM | POA: Diagnosis present

## 2022-01-25 DIAGNOSIS — I2510 Atherosclerotic heart disease of native coronary artery without angina pectoris: Secondary | ICD-10-CM | POA: Diagnosis present

## 2022-01-25 DIAGNOSIS — I5043 Acute on chronic combined systolic (congestive) and diastolic (congestive) heart failure: Secondary | ICD-10-CM | POA: Diagnosis present

## 2022-01-25 DIAGNOSIS — R609 Edema, unspecified: Secondary | ICD-10-CM | POA: Insufficient documentation

## 2022-01-25 DIAGNOSIS — I279 Pulmonary heart disease, unspecified: Secondary | ICD-10-CM | POA: Diagnosis not present

## 2022-01-25 DIAGNOSIS — J439 Emphysema, unspecified: Secondary | ICD-10-CM | POA: Diagnosis present

## 2022-01-25 DIAGNOSIS — I2609 Other pulmonary embolism with acute cor pulmonale: Secondary | ICD-10-CM | POA: Diagnosis present

## 2022-01-25 DIAGNOSIS — E785 Hyperlipidemia, unspecified: Secondary | ICD-10-CM | POA: Diagnosis present

## 2022-01-25 DIAGNOSIS — I272 Pulmonary hypertension, unspecified: Secondary | ICD-10-CM | POA: Diagnosis not present

## 2022-01-25 DIAGNOSIS — J9601 Acute respiratory failure with hypoxia: Secondary | ICD-10-CM | POA: Diagnosis present

## 2022-01-25 DIAGNOSIS — Z8701 Personal history of pneumonia (recurrent): Secondary | ICD-10-CM | POA: Diagnosis not present

## 2022-01-25 DIAGNOSIS — J9602 Acute respiratory failure with hypercapnia: Secondary | ICD-10-CM | POA: Diagnosis present

## 2022-01-25 DIAGNOSIS — Z87891 Personal history of nicotine dependence: Secondary | ICD-10-CM | POA: Diagnosis not present

## 2022-01-25 DIAGNOSIS — I959 Hypotension, unspecified: Secondary | ICD-10-CM | POA: Diagnosis not present

## 2022-01-25 DIAGNOSIS — Z20822 Contact with and (suspected) exposure to covid-19: Secondary | ICD-10-CM | POA: Diagnosis present

## 2022-01-25 DIAGNOSIS — D72829 Elevated white blood cell count, unspecified: Secondary | ICD-10-CM | POA: Diagnosis not present

## 2022-01-25 DIAGNOSIS — E8809 Other disorders of plasma-protein metabolism, not elsewhere classified: Secondary | ICD-10-CM | POA: Diagnosis present

## 2022-01-25 DIAGNOSIS — R570 Cardiogenic shock: Secondary | ICD-10-CM | POA: Diagnosis present

## 2022-01-25 DIAGNOSIS — I509 Heart failure, unspecified: Secondary | ICD-10-CM | POA: Diagnosis not present

## 2022-01-25 DIAGNOSIS — I517 Cardiomegaly: Secondary | ICD-10-CM | POA: Diagnosis not present

## 2022-01-25 DIAGNOSIS — I5031 Acute diastolic (congestive) heart failure: Secondary | ICD-10-CM | POA: Diagnosis not present

## 2022-01-25 DIAGNOSIS — Z79899 Other long term (current) drug therapy: Secondary | ICD-10-CM | POA: Diagnosis not present

## 2022-01-25 DIAGNOSIS — R601 Generalized edema: Secondary | ICD-10-CM | POA: Diagnosis not present

## 2022-01-25 LAB — RENAL FUNCTION PANEL
Albumin: 3.1 g/dL — ABNORMAL LOW (ref 3.5–5.0)
Anion gap: 9 (ref 5–15)
BUN: 40 mg/dL — ABNORMAL HIGH (ref 8–23)
CO2: 33 mmol/L — ABNORMAL HIGH (ref 22–32)
Calcium: 9.1 mg/dL (ref 8.9–10.3)
Chloride: 99 mmol/L (ref 98–111)
Creatinine, Ser: 2.05 mg/dL — ABNORMAL HIGH (ref 0.61–1.24)
GFR, Estimated: 33 mL/min — ABNORMAL LOW (ref >60)
Glucose, Bld: 121 mg/dL — ABNORMAL HIGH (ref 70–99)
Phosphorus: 4 mg/dL (ref 2.5–4.6)
Potassium: 4 mmol/L (ref 3.5–5.1)
Sodium: 141 mmol/L (ref 135–145)

## 2022-01-25 MED ORDER — FUROSEMIDE 10 MG/ML IJ SOLN
INTRAMUSCULAR | Status: AC
  Administered 2022-01-25: 80 mg via INTRAVENOUS
  Filled 2022-01-25: qty 8

## 2022-01-25 MED ORDER — POTASSIUM CHLORIDE CRYS ER 20 MEQ PO TBCR
EXTENDED_RELEASE_TABLET | ORAL | Status: AC
  Administered 2022-01-25: 20 meq via ORAL
  Filled 2022-01-25: qty 1

## 2022-01-25 MED ORDER — FUROSEMIDE 10 MG/ML IJ SOLN: 80.0000 mg | Freq: Once | INTRAMUSCULAR | Status: AC

## 2022-01-25 MED ORDER — POTASSIUM CHLORIDE CRYS ER 20 MEQ PO TBCR: 20.0000 meq | EXTENDED_RELEASE_TABLET | Freq: Once | ORAL | Status: AC

## 2022-01-25 NOTE — Progress Notes (Signed)
Patient tolerated Lasix injection well.  Vital signs were ok.  Oxygen would start out in low 80s and would quickly recover after a few breaths.  He said this has been a thing since he had pneumonia.  I talked to the patient about making sure he talkes to his doctor about getting that checked out.  He said he would.

## 2022-01-28 ENCOUNTER — Other Ambulatory Visit: Payer: Self-pay

## 2022-01-28 ENCOUNTER — Emergency Department: Payer: Medicare HMO

## 2022-01-28 ENCOUNTER — Inpatient Hospital Stay
Admission: EM | Admit: 2022-01-28 | Discharge: 2022-02-06 | DRG: 291 | Disposition: A | Discharge status: Home Health | Payer: Medicare HMO | Source: Ambulatory Visit | Attending: Internal Medicine | Admitting: Internal Medicine

## 2022-01-28 ENCOUNTER — Ambulatory Visit
Admission: RE | Admit: 2022-01-28 | Discharge: 2022-01-28 | Disposition: A | Discharge status: Home / Self Care | Payer: Medicare HMO | Source: Ambulatory Visit | Attending: Nephrology | Admitting: Nephrology

## 2022-01-28 DIAGNOSIS — J9611 Chronic respiratory failure with hypoxia: Secondary | ICD-10-CM

## 2022-01-28 DIAGNOSIS — I130 Hypertensive heart and chronic kidney disease with heart failure and stage 1 through stage 4 chronic kidney disease, or unspecified chronic kidney disease: Secondary | ICD-10-CM

## 2022-01-28 DIAGNOSIS — I714 Abdominal aortic aneurysm, without rupture, unspecified: Secondary | ICD-10-CM | POA: Diagnosis present

## 2022-01-28 DIAGNOSIS — Z20822 Contact with and (suspected) exposure to covid-19: Secondary | ICD-10-CM | POA: Diagnosis present

## 2022-01-28 DIAGNOSIS — J9691 Respiratory failure, unspecified with hypoxia: Secondary | ICD-10-CM

## 2022-01-28 DIAGNOSIS — J441 Chronic obstructive pulmonary disease with (acute) exacerbation: Secondary | ICD-10-CM

## 2022-01-28 DIAGNOSIS — Z87891 Personal history of nicotine dependence: Secondary | ICD-10-CM | POA: Diagnosis not present

## 2022-01-28 DIAGNOSIS — I5021 Acute systolic (congestive) heart failure: Secondary | ICD-10-CM

## 2022-01-28 DIAGNOSIS — I2609 Other pulmonary embolism with acute cor pulmonale: Secondary | ICD-10-CM

## 2022-01-28 DIAGNOSIS — I959 Hypotension, unspecified: Secondary | ICD-10-CM

## 2022-01-28 DIAGNOSIS — J439 Emphysema, unspecified: Secondary | ICD-10-CM | POA: Diagnosis present

## 2022-01-28 DIAGNOSIS — Z8701 Personal history of pneumonia (recurrent): Secondary | ICD-10-CM | POA: Diagnosis not present

## 2022-01-28 DIAGNOSIS — N184 Chronic kidney disease, stage 4 (severe): Secondary | ICD-10-CM

## 2022-01-28 DIAGNOSIS — E8809 Other disorders of plasma-protein metabolism, not elsewhere classified: Secondary | ICD-10-CM | POA: Diagnosis present

## 2022-01-28 DIAGNOSIS — I2510 Atherosclerotic heart disease of native coronary artery without angina pectoris: Secondary | ICD-10-CM | POA: Diagnosis present

## 2022-01-28 DIAGNOSIS — J9602 Acute respiratory failure with hypercapnia: Secondary | ICD-10-CM | POA: Diagnosis present

## 2022-01-28 DIAGNOSIS — E785 Hyperlipidemia, unspecified: Secondary | ICD-10-CM | POA: Diagnosis present

## 2022-01-28 DIAGNOSIS — I5043 Acute on chronic combined systolic (congestive) and diastolic (congestive) heart failure: Secondary | ICD-10-CM | POA: Diagnosis present

## 2022-01-28 DIAGNOSIS — Z7982 Long term (current) use of aspirin: Secondary | ICD-10-CM

## 2022-01-28 DIAGNOSIS — R570 Cardiogenic shock: Secondary | ICD-10-CM | POA: Diagnosis present

## 2022-01-28 DIAGNOSIS — N1832 Chronic kidney disease, stage 3b: Secondary | ICD-10-CM | POA: Diagnosis present

## 2022-01-28 DIAGNOSIS — Z79899 Other long term (current) drug therapy: Secondary | ICD-10-CM

## 2022-01-28 DIAGNOSIS — N19 Unspecified kidney failure: Secondary | ICD-10-CM

## 2022-01-28 DIAGNOSIS — J9601 Acute respiratory failure with hypoxia: Secondary | ICD-10-CM | POA: Diagnosis present

## 2022-01-28 DIAGNOSIS — D72829 Elevated white blood cell count, unspecified: Secondary | ICD-10-CM

## 2022-01-28 DIAGNOSIS — N179 Acute kidney failure, unspecified: Secondary | ICD-10-CM | POA: Diagnosis present

## 2022-01-28 DIAGNOSIS — I509 Heart failure, unspecified: Secondary | ICD-10-CM

## 2022-01-28 LAB — CBC WITH DIFFERENTIAL/PLATELET
Abs Immature Granulocytes: 0.05 10*3/uL (ref 0.00–0.07)
Basophils Absolute: 0 10*3/uL (ref 0.0–0.1)
Basophils Relative: 0 %
Eosinophils Absolute: 0.1 10*3/uL (ref 0.0–0.5)
Eosinophils Relative: 1 %
HCT: 54.4 % — ABNORMAL HIGH (ref 39.0–52.0)
Hemoglobin: 17 g/dL (ref 13.0–17.0)
Immature Granulocytes: 0 %
Lymphocytes Relative: 8 %
Lymphs Abs: 0.9 10*3/uL (ref 0.7–4.0)
MCH: 31.9 pg (ref 26.0–34.0)
MCHC: 31.3 g/dL (ref 30.0–36.0)
MCV: 102.1 fL — ABNORMAL HIGH (ref 80.0–100.0)
Monocytes Absolute: 1.1 10*3/uL — ABNORMAL HIGH (ref 0.1–1.0)
Monocytes Relative: 10 %
Neutro Abs: 9.4 10*3/uL — ABNORMAL HIGH (ref 1.7–7.7)
Neutrophils Relative %: 81 %
Platelets: 141 10*3/uL — ABNORMAL LOW (ref 150–400)
RBC: 5.33 MIL/uL (ref 4.22–5.81)
RDW: 14.4 % (ref 11.5–15.5)
WBC: 11.5 10*3/uL — ABNORMAL HIGH (ref 4.0–10.5)
nRBC: 0 % (ref 0.0–0.2)

## 2022-01-28 LAB — COMPREHENSIVE METABOLIC PANEL
ALT: 21 U/L (ref 0–44)
AST: 25 U/L (ref 15–41)
Albumin: 2.8 g/dL — ABNORMAL LOW (ref 3.5–5.0)
Alkaline Phosphatase: 72 U/L (ref 38–126)
Anion gap: 10 (ref 5–15)
BUN: 60 mg/dL — ABNORMAL HIGH (ref 8–23)
CO2: 29 mmol/L (ref 22–32)
Calcium: 8.7 mg/dL — ABNORMAL LOW (ref 8.9–10.3)
Chloride: 98 mmol/L (ref 98–111)
Creatinine, Ser: 3.17 mg/dL — ABNORMAL HIGH (ref 0.61–1.24)
GFR, Estimated: 20 mL/min — ABNORMAL LOW (ref >60)
Glucose, Bld: 115 mg/dL — ABNORMAL HIGH (ref 70–99)
Potassium: 4 mmol/L (ref 3.5–5.1)
Sodium: 137 mmol/L (ref 135–145)
Total Bilirubin: 2.1 mg/dL — ABNORMAL HIGH (ref 0.3–1.2)
Total Protein: 5.6 g/dL — ABNORMAL LOW (ref 6.5–8.1)

## 2022-01-28 LAB — CBC
HCT: 56.7 % — ABNORMAL HIGH (ref 39.0–52.0)
Hemoglobin: 17.5 g/dL — ABNORMAL HIGH (ref 13.0–17.0)
MCH: 32.2 pg (ref 26.0–34.0)
MCHC: 30.9 g/dL (ref 30.0–36.0)
MCV: 104.4 fL — ABNORMAL HIGH (ref 80.0–100.0)
Platelets: 144 10*3/uL — ABNORMAL LOW (ref 150–400)
RBC: 5.43 MIL/uL (ref 4.22–5.81)
RDW: 14.4 % (ref 11.5–15.5)
WBC: 10.2 10*3/uL (ref 4.0–10.5)
nRBC: 0 % (ref 0.0–0.2)

## 2022-01-28 LAB — MAGNESIUM: Magnesium: 2.5 mg/dL — ABNORMAL HIGH (ref 1.7–2.4)

## 2022-01-28 LAB — RESP PANEL BY RT-PCR (FLU A&B, COVID) ARPGX2
Influenza A by PCR: NEGATIVE
Influenza B by PCR: NEGATIVE
SARS Coronavirus 2 by RT PCR: NEGATIVE

## 2022-01-28 LAB — MRSA NEXT GEN BY PCR, NASAL: MRSA by PCR Next Gen: NOT DETECTED

## 2022-01-28 LAB — BRAIN NATRIURETIC PEPTIDE: B Natriuretic Peptide: 968 pg/mL — ABNORMAL HIGH (ref 0.0–100.0)

## 2022-01-28 LAB — TROPONIN I (HIGH SENSITIVITY)
Troponin I (High Sensitivity): 142 ng/L (ref <18)
Troponin I (High Sensitivity): 168 ng/L (ref <18)

## 2022-01-28 LAB — LACTIC ACID, PLASMA: Lactic Acid, Venous: 1.2 mmol/L (ref 0.5–1.9)

## 2022-01-28 LAB — GLUCOSE, CAPILLARY: Glucose-Capillary: 92 mg/dL (ref 70–99)

## 2022-01-28 MED ORDER — HEPARIN SODIUM (PORCINE) 5000 UNIT/ML IJ SOLN
5000.0000 [IU] | Freq: Three times a day (TID) | INTRAMUSCULAR | Status: DC
  Administered 2022-01-28 – 2022-02-06 (×25): 5000 [IU] via SUBCUTANEOUS
  Filled 2022-01-28 (×25): qty 1

## 2022-01-28 MED ORDER — FUROSEMIDE 10 MG/ML IJ SOLN
8.0000 mg/h | INTRAVENOUS | Status: DC
  Administered 2022-01-28 – 2022-02-06 (×9): 8 mg/h via INTRAVENOUS
  Filled 2022-01-28 (×9): qty 20

## 2022-01-28 MED ORDER — FAMOTIDINE IN NACL 20-0.9 MG/50ML-% IV SOLN
20.0000 mg | Freq: Every day | INTRAVENOUS | Status: DC
  Administered 2022-01-28: 20 mg via INTRAVENOUS
  Filled 2022-01-28: qty 50

## 2022-01-28 MED ORDER — POTASSIUM CHLORIDE CRYS ER 20 MEQ PO TBCR: 20.0000 meq | EXTENDED_RELEASE_TABLET | Freq: Once | ORAL | Status: DC

## 2022-01-28 MED ORDER — DOPAMINE-DEXTROSE 3.2-5 MG/ML-% IV SOLN
0.0000 ug/kg/min | INTRAVENOUS | Status: DC
  Administered 2022-01-28: 5 ug/kg/min via INTRAVENOUS
  Administered 2022-01-29: 2.5 ug/kg/min via INTRAVENOUS
  Filled 2022-01-28 (×2): qty 250

## 2022-01-28 MED ORDER — SODIUM CHLORIDE 0.9% FLUSH
3.0000 mL | Freq: Two times a day (BID) | INTRAVENOUS | Status: DC
  Administered 2022-01-29 – 2022-02-05 (×13): 3 mL via INTRAVENOUS

## 2022-01-28 MED ORDER — ACETAMINOPHEN 325 MG PO TABS: 650.0000 mg | ORAL_TABLET | ORAL | Status: DC | PRN

## 2022-01-28 MED ORDER — FUROSEMIDE 10 MG/ML IJ SOLN: 80.0000 mg | Freq: Once | INTRAMUSCULAR | Status: DC

## 2022-01-28 MED ORDER — POLYETHYLENE GLYCOL 3350 17 G PO PACK: 17.0000 g | PACK | Freq: Every day | ORAL | Status: DC | PRN

## 2022-01-28 MED ORDER — ONDANSETRON HCL 4 MG/2ML IJ SOLN: 4.0000 mg | Freq: Four times a day (QID) | INTRAMUSCULAR | Status: DC | PRN

## 2022-01-28 MED ORDER — ASPIRIN 81 MG PO CHEW
324.0000 mg | CHEWABLE_TABLET | Freq: Once | ORAL | Status: DC
  Filled 2022-01-28: qty 4

## 2022-01-28 MED ORDER — DOCUSATE SODIUM 100 MG PO CAPS: 100.0000 mg | ORAL_CAPSULE | Freq: Two times a day (BID) | ORAL | Status: DC | PRN

## 2022-01-28 MED ORDER — SODIUM CHLORIDE 0.9% FLUSH: 3.0000 mL | INTRAVENOUS | Status: DC | PRN

## 2022-01-28 MED ORDER — CHLORHEXIDINE GLUCONATE CLOTH 2 % EX PADS
6.0000 | MEDICATED_PAD | Freq: Every day | CUTANEOUS | Status: DC
  Administered 2022-01-28 – 2022-02-03 (×6): 6 via TOPICAL
  Filled 2022-01-28: qty 6

## 2022-01-28 MED ORDER — ASPIRIN 81 MG PO CHEW
243.0000 mg | CHEWABLE_TABLET | Freq: Once | ORAL | Status: AC
  Administered 2022-01-28: 243 mg via ORAL

## 2022-01-28 MED ORDER — SODIUM CHLORIDE 0.9 % IV SOLN
250.0000 mL | INTRAVENOUS | Status: DC | PRN
  Administered 2022-02-01: 250 mL via INTRAVENOUS

## 2022-01-28 NOTE — Assessment & Plan Note (Signed)
Dopamine infusion

## 2022-01-28 NOTE — ED Provider Notes (Signed)
[facility] Provider Note    Event Date/Time   First MD Initiated Contact with Patient 01/28/22 1623     (approximate)   History   Hypotension   HPI  John [name] is a 76 y.o. male who was sent from same-day surgery.  He was scheduled to get IV Lasix but his blood pressure was in the 37S systolic so he could not get it.  Patient himself says he feels fine he is not short of breath he is not lightheaded he is not having any pain anywhere he does have a little bit more swelling than usual.  Patient is not usually on oxygen.  On 4 L of oxygen his O2 sats are 93%      Physical Exam   Triage Vital Signs: ED Triage Vitals  Enc Vitals Group     BP 01/28/22 1557 (!) 79/62     Pulse Rate 01/28/22 1557 92     Resp 01/28/22 1557 18     Temp 01/28/22 1557 98.7 F (37.1 C)     Temp src --      SpO2 01/28/22 1557 (!) 79 %     Weight --      Height 01/28/22 1559 5\' 10"  (1.778 m)     Head Circumference --      Peak Flow --      Pain Score 01/28/22 1559 0     Pain Loc --      Pain Edu? --      Excl. in [place]? --     Most recent vital signs: Vitals:   01/28/22 2009 01/28/22 2030  BP: 94/69 111/76  Pulse: 72 79  Resp: 16 (!) 23  Temp:  (!) 97.3 F (36.3 C)  SpO2: 95% (!) 89%     General: Awake, alert, no distress.  CV:  Good peripheral perfusion.  Heart regular rate and rhythm no audible murmurs Resp:  Normal effort.  Lung sounds are distant but sound clear Abd:  No distention.  Soft nontender Bilateral 2+ edema in extremities.  Patient reports this is slightly worse than usual   ED Results / Procedures / Treatments   Labs (all labs ordered are listed, but only abnormal results are displayed) Labs Reviewed  BRAIN NATRIURETIC PEPTIDE - Abnormal; Notable for the following components:      Result Value   B Natriuretic Peptide 968.0 (*)    All other components within normal limits  COMPREHENSIVE METABOLIC PANEL - Abnormal; Notable for the  following components:   Glucose, Bld 115 (*)    BUN 60 (*)    Creatinine, Ser 3.17 (*)    Calcium 8.7 (*)    Total Protein 5.6 (*)    Albumin 2.8 (*)    Total Bilirubin 2.1 (*)    GFR, Estimated 20 (*)    All other components within normal limits  CBC WITH DIFFERENTIAL/PLATELET - Abnormal; Notable for the following components:   WBC 11.5 (*)    HCT 54.4 (*)    MCV 102.1 (*)    Platelets 141 (*)    Neutro Abs 9.4 (*)    Monocytes Absolute 1.1 (*)    All other components within normal limits  CBC - Abnormal; Notable for the following components:   Hemoglobin 17.5 (*)    HCT 56.7 (*)    MCV 104.4 (*)    Platelets 144 (*)    All other components within normal limits  MAGNESIUM - Abnormal; Notable for the  following components:   Magnesium 2.5 (*)    All other components within normal limits  TROPONIN I (HIGH SENSITIVITY) - Abnormal; Notable for the following components:   Troponin I (High Sensitivity) 142 (*)    All other components within normal limits  TROPONIN I (HIGH SENSITIVITY) - Abnormal; Notable for the following components:   Troponin I (High Sensitivity) 168 (*)    All other components within normal limits  RESP PANEL BY RT-PCR (FLU A&B, COVID) ARPGX2  MRSA NEXT GEN BY PCR, NASAL  LACTIC ACID, PLASMA  GLUCOSE, CAPILLARY  LACTIC ACID, PLASMA  CBC  URINALYSIS, COMPLETE (UACMP) WITH MICROSCOPIC  URINE DRUG SCREEN, QUALITATIVE ([facility] ONLY)  COMPREHENSIVE METABOLIC PANEL  MAGNESIUM  PHOSPHORUS     EKG  EKG read interpreted by me shows normal sinus rhythm rate of 87 normal axis low amplitude new flipped T waves in V5 and 6 compared to 2 years ago.   RADIOLOGY    PROCEDURES:  Critical Care performed: Critical care time 45 minutes.  This includes putting him on dopamine to improve his blood pressure and putting him on BiPAP to improve his O2 sats.  I checked on him several times after this.  Additionally I spoke to the ICU doctor about him and reviewed his old  labs and other history.  I reviewed his current labs and history as well.  Procedures   MEDICATIONS ORDERED IN ED: Medications  DOPamine (INTROPIN) 800 mg in dextrose 5 % 250 mL (3.2 mg/mL) infusion (5 mcg/kg/min  107 kg Intravenous New Bag/Given 01/28/22 1749)  sodium chloride flush (NS) 0.9 % injection 3 mL (3 mLs Intravenous Not Given 01/28/22 2243)  sodium chloride flush (NS) 0.9 % injection 3 mL (has no administration in time range)  0.9 %  sodium chloride infusion (has no administration in time range)  acetaminophen (TYLENOL) tablet 650 mg (has no administration in time range)  docusate sodium (COLACE) capsule 100 mg (has no administration in time range)  polyethylene glycol (MIRALAX / GLYCOLAX) packet 17 g (has no administration in time range)  ondansetron (ZOFRAN) injection 4 mg (has no administration in time range)  famotidine (PEPCID) IVPB 20 mg premix (20 mg Intravenous New Bag/Given 01/28/22 2114)  heparin injection 5,000 Units (5,000 Units Subcutaneous Given 01/28/22 2111)  furosemide (LASIX) 200 mg in dextrose 5 % 100 mL (2 mg/mL) infusion (8 mg/hr Intravenous New Bag/Given 01/28/22 2106)  Chlorhexidine Gluconate Cloth 2 % PADS 6 each (6 each Topical Given 01/28/22 2029)  aspirin chewable tablet 243 mg (243 mg Oral Given 01/28/22 1753)     IMPRESSION / MDM / ASSESSMENT AND PLAN / ED COURSE  I reviewed the triage vital signs and the nursing notes. Patient with hypoxia hypotension and congestive heart failure.  His blood pressure is too low to give him enalapril or Lasix or nitro.  His oxygen level comes up with nasal cannula but improves more with BiPAP.  Patient's renal function is worse than it previously.  Is been dropping since the beginning of the year.  2 years ago it was 44s and it went down to 37 in January 30 3 at the beginning of this month and now it is 20.  BUN and creatinine are climbing.  He has somewhat elevated white count and 81% polys in his differential his flu and  SARS are negative.  He is not anemic.  Chest x-ray does look like CHF.  There could perhaps be pneumonia there as well  The patient is on  the cardiac monitor to evaluate for evidence of arrhythmia and/or significant heart rate changes.  None were seen.      FINAL CLINICAL IMPRESSION(S) / ED DIAGNOSES   Final diagnoses:  Hypotension, unspecified hypotension type  AKI (acute kidney injury) ([place])  Congestive heart failure, unspecified HF chronicity, unspecified heart failure type ([place])  Leukocytosis, unspecified type     Rx / DC Orders   ED Discharge Orders     None        Note:  This document was prepared using Dragon voice recognition software and may include unintentional dictation errors.   Nena Polio, MD 01/29/22 [phone]

## 2022-01-28 NOTE — Progress Notes (Signed)
This pt came in for a dose of lasix per MD's order, B/P was 90/72, rechecked was 86/60 and 78/63. Spoke with Juliette Mangle at Dr Elwyn Lade office and the MD was notified, per MD's order, the nurse Anderson Malta said to take the pt to the ER for further evaluation. The Lasix iv order nor the Potassium orders were administered to the pt in the Same Day Surgery unit. Continue to monitor.

## 2022-01-28 NOTE — ED Triage Notes (Signed)
Patient to ER from same day surgery where he was scheduled to receive IV lasix. Patient was unable to receive medication as bp was 43'O systolic. Patient denies feeling badly or any other symptoms.  Reports taking his usual blood pressure medications this morning as normal.

## 2022-01-28 NOTE — ED Notes (Signed)
Patient placed on 4L via [place] with improvement of sats to 93%.

## 2022-01-28 NOTE — H&P (Addendum)
NAME:  John [name], MRN:  [id number], DOB:  [birth year], LOS: 0 ADMISSION DATE:  01/28/2022  CHIEF COMPLAINT:  resp failure   History of Present Illness:  76 yo AAM with acute resp distress with hypoxia with generalized swelling and anasarca with low BP   Patient was scheduled for outpatient lasix therapy and was NOT given therapy due to very low BP In ER SBP 70's, baseline SBP is 110  Previous admission 2001 with similar presentation and placed on  lasix infusion  Patient placed on biPAP and placed on dopamine infusion PCCM asked to admit  ECHO 2001 EF 45%  Pertinent  Medical History   Active Ambulatory Problems    Diagnosis Date Noted   Community acquired pneumonia 09/01/2019   Pneumonia 09/01/2019   Rupture of left quadriceps tendon 02/15/2020   COPD (chronic obstructive pulmonary disease) ([place]) 02/16/2020   Acute CHF (congestive heart failure) ([place]) 06/21/2020   Hypertension    Erythrocytosis 12/20/2021   Thrombocytopenia ([place]) 12/20/2021   Resolved Ambulatory Problems    Diagnosis Date Noted   No Resolved Ambulatory Problems   Past Medical History:  Diagnosis Date   CHF (congestive heart failure) ([place])      Significant Hospital Events: Including procedures, antibiotic start and stop dates in addition to other pertinent events   2/13 admitted for progressive cardio-renal syndrome   Micro Data:  COVID FLU NEG  Objective   Blood pressure (!) 75/60, pulse 85, temperature 98.7 F (37.1 C), resp. rate 18, height 5\' 10"  (1.778 m), SpO2 96 %.       No intake or output data in the 24 hours ending 01/28/22 1824 There were no vitals filed for this visit.    Review of Systems:  Gen:  Denies  fever, sweats, chills weight loss  HEENT: Denies blurred vision, double vision, ear pain, eye pain, hearing loss, nose bleeds, sore throat Cardiac:  No dizziness, chest pain or heaviness, chest tightness,edema, No JVD Resp:   No cough, -sputum production, +shortness  of breath,-wheezing, -hemoptysis,  +swelling all over Other:  All other systems negative   PHYSICAL EXAMINATION:  GENERAL:critically ill appearing, +mild resp distress, on biPAP EYES: Pupils equal, round, reactive to light.  No scleral icterus.  MOUTH: Moist mucosal membrane.  NECK: Supple.  PULMONARY: +rhonchi,  CARDIOVASCULAR: S1 and S2.  No murmurs  GASTROINTESTINAL: Soft, nontender, +distended. Positive bowel sounds.  MUSCULOSKELETAL:+ swelling, + edema.  NEUROLOGIC: alert and awake, no focal deficits SKIN:intact,warm,dry     Labs/imaging that I havepersonally reviewed  (right click and "Reselect all SmartList Selections" daily)      ASSESSMENT AND PLAN SYNOPSIS  76 yo AAM with acute sCHF exacerbation with  progressive renal failure with generalized anasarca with progressive cardio-renal syndrome with COPD exacerbation cor pulmonale and pulmonary HTN   Severe ACUTE Hypoxic and Hypercapnic Respiratory Failure Placed on biPAP -Wean Fio2  as tolerated  SEVERE COPD EXACERBATION -morphine as needed -wean fio2 as needed and tolerated  CARDIAC FAILURE-acute combined systolic/diastolic dysfunction -oxygen as needed BIPAP Lasix drip maybe warranted -follow up cardiac enzymes as indicated Cardiology consulted Needs advanced heart failure management ECHO ordered Follow CE's   CARDIAC ICU monitoring   ACUTE KIDNEY INJURY/Renal Failure progressed to CKD stage 4 -continue Foley Catheter-assess need -Avoid nephrotoxic agents -Follow urine output, BMP -Ensure adequate renal perfusion, optimize oxygenation -Renal dose medications  No intake or output data in the 24 hours ending 01/28/22 1824    CARDIOGENIC SHOCK SOURCE-sCHF -use vasopressors to keep MAP>65  as needed On dopamine -follow ABG and LA -follow up cultures   ENDO - ICU hypoglycemic\Hyperglycemia protocol -check FSBS per protocol   GI GI PROPHYLAXIS as indicated  NUTRITIONAL  STATUS DIET-->NPO Constipation protocol as indicated   ELECTROLYTES -follow labs as needed -replace as needed -pharmacy consultation and following   ACUTE ANEMIA- TRANSFUSE AS NEEDED CONSIDER TRANSFUSION  IF HGB<7 DVT PRX with TED/SCD's ONLY  H/o erythrocytosis Follow up hemo onc  H/o AAA Will need repeat imaging at some point this admission     Best practice (right click and "Reselect all SmartList Selections" daily)  Diet: NPO DVT prophylaxis: Subcutaneous Heparin GI prophylaxis: H2B Foley:  N/A Mobility:  bed rest  Code Status:  FULL Disposition:ICU  Labs   CBC: Recent Labs  Lab 01/28/22 1603  WBC 11.5*  NEUTROABS 9.4*  HGB 17.0  HCT 54.4*  MCV 102.1*  PLT 141*    Basic Metabolic Panel: Recent Labs  Lab 01/25/22 0820 01/28/22 1603  NA 141 137  K 4.0 4.0  CL 99 98  CO2 33* 29  GLUCOSE 121* 115*  BUN 40* 60*  CREATININE 2.05* 3.17*  CALCIUM 9.1 8.7*  PHOS 4.0  --    GFR: Estimated Creatinine Clearance: 24.3 mL/min (A) (by C-G formula based on SCr of 3.17 mg/dL (H)). Recent Labs  Lab 01/28/22 1603  WBC 11.5*    Liver Function Tests: Recent Labs  Lab 01/25/22 0820 01/28/22 1603  AST  --  25  ALT  --  21  ALKPHOS  --  72  BILITOT  --  2.1*  PROT  --  5.6*  ALBUMIN 3.1* 2.8*   No results for input(s): LIPASE, AMYLASE in the last 168 hours. No results for input(s): AMMONIA in the last 168 hours.  ABG    Component Value Date/Time   PHART 7.33 (L) 09/01/2019 1029   PCO2ART 69 (HH) 09/01/2019 1029   PO2ART 84 09/01/2019 1029   HCO3 36.4 (H) 09/01/2019 1029   TCO2 31 02/15/2020 1034   O2SAT 95.5 09/01/2019 1029     Coagulation Profile: No results for input(s): INR, PROTIME in the last 168 hours.  Cardiac Enzymes: No results for input(s): CKTOTAL, CKMB, CKMBINDEX, TROPONINI in the last 168 hours.  HbA1C: No results found for: HGBA1C  CBG: No results for input(s): GLUCAP in the last 168 hours.   Past Medical History:   He,  has a past medical history of CHF (congestive heart failure) ([place]), COPD (chronic obstructive pulmonary disease) ([place]) (02/16/2020), and Hypertension.   Surgical History:   Past Surgical History:  Procedure Laterality Date   CATARACT EXTRACTION W/PHACO Left 10/31/2021   Procedure: CATARACT EXTRACTION PHACO AND INTRAOCULAR LENS PLACEMENT ([place]) LEFT VISION BLUE 3.45 00:48.0;  Surgeon: Leandrew Koyanagi, MD;  Location: [facility];  Service: Ophthalmology;  Laterality: Left;   CATARACT EXTRACTION W/PHACO Right 11/14/2021   Procedure: CATARACT EXTRACTION PHACO AND INTRAOCULAR LENS PLACEMENT ([facility]) RIGHT 8.59 01:10.5;  Surgeon: Leandrew Koyanagi, MD;  Location: [facility];  Service: Ophthalmology;  Laterality: Right;   INGUINAL HERNIA REPAIR Right 02/04/2017   Procedure: HERNIA REPAIR INGUINAL ADULT;  Surgeon: Leonie Green, MD;  Location: [facility];  Service: General;  Laterality: Right;   NO PAST SURGERIES     QUADRICEPS TENDON REPAIR Left 02/15/2020   Procedure: REPAIR QUADRICEP TENDON;  Surgeon: Corky Mull, MD;  Location: [facility];  Service: Orthopedics;  Laterality: Left;     Social History:   reports that he quit  smoking about 6 years ago. His smoking use included cigarettes. He has a 13.25 pack-year smoking history. He has never used smokeless tobacco. He reports current alcohol use. He reports that he does not use drugs.   Family History:  His family history includes Diabetes in his brother, maternal grandmother, mother, and paternal grandmother.   Allergies No Known Allergies   Home Medications  Prior to Admission medications   Medication Sig Start Date End Date Taking? Authorizing Provider  aspirin 81 MG chewable tablet Chew 81 mg by mouth daily.   Yes [provider]  aspirin EC 325 MG tablet Take 1 tablet (325 mg total) by mouth daily. 02/16/20  Yes Lattie Corns, PA-C  losartan (COZAAR) 100 MG tablet Take 100 mg by mouth daily.  06/13/20  Yes [provider]  torsemide (DEMADEX) 100 MG tablet Take 1 tablet by mouth. Take 1 tablet (100 mg total) by mouth daily 02/05/21  Yes [provider]  brimonidine (ALPHAGAN) 0.2 % ophthalmic solution 1 drop 2 (two) times daily. 1 Drop(s) In Eye(s) Twice Daily Patient not taking: Reported on 01/11/2022 11/16/21   [provider]      Critical Care Time devoted to patient care services described in this note is 65 minutes.   Critical care was necessary to treat /prevent imminent and life-threatening deterioration.   PATIENT WITH VERY POOR PROGNOSIS I ANTICIPATE PROLONGED ICU LOS  Patient is critically ill. Patient with Multiorgan failure and at high risk for cardiac arrest and death.    Corrin Parker, M.D.  [facility]  Medical Director [place] Director [facility] Cardio-Pulmonary Department

## 2022-01-29 ENCOUNTER — Inpatient Hospital Stay
Admit: 2022-01-29 | Discharge: 2022-01-29 | Disposition: A | Discharge status: Home / Self Care | Payer: Medicare HMO | Attending: Internal Medicine | Admitting: Internal Medicine

## 2022-01-29 LAB — CBC
HCT: 54.6 % — ABNORMAL HIGH (ref 39.0–52.0)
Hemoglobin: 16.3 g/dL (ref 13.0–17.0)
MCH: 31.5 pg (ref 26.0–34.0)
MCHC: 29.9 g/dL — ABNORMAL LOW (ref 30.0–36.0)
MCV: 105.6 fL — ABNORMAL HIGH (ref 80.0–100.0)
Platelets: 139 10*3/uL — ABNORMAL LOW (ref 150–400)
RBC: 5.17 MIL/uL (ref 4.22–5.81)
RDW: 14.4 % (ref 11.5–15.5)
WBC: 9.2 10*3/uL (ref 4.0–10.5)
nRBC: 0 % (ref 0.0–0.2)

## 2022-01-29 LAB — ECHOCARDIOGRAM COMPLETE
AR max vel: 3 cm2
AV Area VTI: 3.18 cm2
AV Area mean vel: 2.96 cm2
AV Mean grad: 4 mmHg
AV Peak grad: 7.5 mmHg
Ao pk vel: 1.37 m/s
Area-P 1/2: 3.12 cm2
Height: 70 in
MV VTI: 2.94 cm2
S' Lateral: 2.95 cm
Weight: 3894.21 oz

## 2022-01-29 LAB — BASIC METABOLIC PANEL
Anion gap: 8 (ref 5–15)
BUN: 55 mg/dL — ABNORMAL HIGH (ref 8–23)
CO2: 34 mmol/L — ABNORMAL HIGH (ref 22–32)
Calcium: 9 mg/dL (ref 8.9–10.3)
Chloride: 100 mmol/L (ref 98–111)
Creatinine, Ser: 2.7 mg/dL — ABNORMAL HIGH (ref 0.61–1.24)
GFR, Estimated: 24 mL/min — ABNORMAL LOW (ref >60)
Glucose, Bld: 115 mg/dL — ABNORMAL HIGH (ref 70–99)
Potassium: 4.6 mmol/L (ref 3.5–5.1)
Sodium: 142 mmol/L (ref 135–145)

## 2022-01-29 LAB — COMPREHENSIVE METABOLIC PANEL
ALT: 21 U/L (ref 0–44)
AST: 23 U/L (ref 15–41)
Albumin: 2.8 g/dL — ABNORMAL LOW (ref 3.5–5.0)
Alkaline Phosphatase: 63 U/L (ref 38–126)
Anion gap: 7 (ref 5–15)
BUN: 60 mg/dL — ABNORMAL HIGH (ref 8–23)
CO2: 34 mmol/L — ABNORMAL HIGH (ref 22–32)
Calcium: 8.7 mg/dL — ABNORMAL LOW (ref 8.9–10.3)
Chloride: 99 mmol/L (ref 98–111)
Creatinine, Ser: 3.09 mg/dL — ABNORMAL HIGH (ref 0.61–1.24)
GFR, Estimated: 20 mL/min — ABNORMAL LOW (ref >60)
Glucose, Bld: 126 mg/dL — ABNORMAL HIGH (ref 70–99)
Potassium: 4.1 mmol/L (ref 3.5–5.1)
Sodium: 140 mmol/L (ref 135–145)
Total Bilirubin: 1.4 mg/dL — ABNORMAL HIGH (ref 0.3–1.2)
Total Protein: 5.4 g/dL — ABNORMAL LOW (ref 6.5–8.1)

## 2022-01-29 LAB — MAGNESIUM: Magnesium: 2.4 mg/dL (ref 1.7–2.4)

## 2022-01-29 LAB — LACTIC ACID, PLASMA: Lactic Acid, Venous: 1.7 mmol/L (ref 0.5–1.9)

## 2022-01-29 LAB — PHOSPHORUS: Phosphorus: 5.6 mg/dL — ABNORMAL HIGH (ref 2.5–4.6)

## 2022-01-29 MED ORDER — ORAL CARE MOUTH RINSE
15.0000 mL | Freq: Two times a day (BID) | OROMUCOSAL | Status: DC
  Administered 2022-01-29 – 2022-02-05 (×13): 15 mL via OROMUCOSAL

## 2022-01-29 MED ORDER — MIDODRINE HCL 5 MG PO TABS
5.0000 mg | ORAL_TABLET | Freq: Three times a day (TID) | ORAL | Status: DC
  Administered 2022-01-29 – 2022-02-06 (×24): 5 mg via ORAL
  Filled 2022-01-29 (×25): qty 1

## 2022-01-29 MED ORDER — ALBUMIN HUMAN 25 % IV SOLN
12.5000 g | Freq: Two times a day (BID) | INTRAVENOUS | Status: DC
  Administered 2022-01-29 – 2022-02-06 (×17): 12.5 g via INTRAVENOUS
  Filled 2022-01-29 (×18): qty 50

## 2022-01-29 NOTE — Progress Notes (Addendum)
[place] for Electrolyte Monitoring and Replacement   Recent Labs: Potassium (mmol/L)  Date Value  01/29/2022 4.1   Magnesium (mg/dL)  Date Value  01/29/2022 2.4   Calcium (mg/dL)  Date Value  01/29/2022 8.7 (L)   Albumin (g/dL)  Date Value  01/29/2022 2.8 (L)   Phosphorus (mg/dL)  Date Value  01/29/2022 5.6 (H)   Sodium (mmol/L)  Date Value  01/29/2022 140   Corrected Ca: 9.66 mg/dL  Assessment: 75yo AAM w/acute hypoxemic respiratory failure w/generalized swelling and anasarca w/hypotension. Pharmacy is asked to follow and replace electrolytes while in the CCU.  Goal of Therapy:  Electrolytes WNL  Plan:  No electrolyte replacement warranted today Recheck electrolytes in am  Dallie Piles ,PharmD Clinical Pharmacist 01/29/2022 7:15 AM

## 2022-01-29 NOTE — Progress Notes (Signed)
*  PRELIMINARY RESULTS* Echocardiogram 2D Echocardiogram has been performed.  John [name] 01/29/2022, 9:48 AM

## 2022-01-29 NOTE — Consult Note (Addendum)
[place] NOTE       Patient ID: John [name] MRN: [id number] DOB/AGE: 1946/07/01 76 y.o.  Admit date: 01/28/2022 Referring Physician Dr. Flora Lipps Primary Physician Dr. Tracie Harrier Primary Cardiologist Nehemiah Massed Reason for Consultation CHF  HPI: The patient is a 76 year old male with a past medical history significant for HFpEF (LVEF 55-60% with severe RV enlargement and moderate RA dilation 01/29/22), hypertension, hyperlipidemia, CAD by CT scan, CKD 3, COPD, primary erythrocytosis who presented to same-day surgery for IV Lasix but was found to be hypotensive and hypoxic initially requiring supplemental oxygen by [place] and eventually placed on BiPAP. He was subsequently brought to [facility] ED 01/28/2022.  Cardiology is consulted for assistance with diuresis.  Per his last note with nephrology from 01/17/2022 the patient was scheduled for Lasix 80 mg IV every Monday Wednesday Friday for 2 weeks due to an increase in his weight.  His baseline from 12/2021 was 228 pounds.  Today the patient is ~243 pounds.  Overnight the patient was started on a Lasix gtt and dopamine for blood pressure support.  He is -2L as recorded in the chart since admission. He is sleeping during interview but is arousable to voice. States he came to the hospital because his legs are were swollen. He denies chest pain, SOB, palpitations. Falls back asleep during interview and doesn't participate further.   Vitals on admission are significant for a blood pressure initially 79/62 which has improved with dopamine infusion to 115/76 this morning.  He was on BiPAP and O2 sats greater than 90 and transitioned to [place] when I saw him.   Labs on admission are significant for a creatinine 3.17, GFR 20 (baseline from 12/2021 at 1.87 and GFR 37), BNP elevated to 968, troponin 142-168.  WBCs downtrending 11.5-10.2-9.2.  H&H 16.3/54.6, platelets 139.  Review of systems complete and found to be negative unless listed  above    Past Medical History:  Diagnosis Date   CHF (congestive heart failure) (HCC)    EF 45% in 2021   COPD (chronic obstructive pulmonary disease) ([place]) 02/16/2020   Hypertension     Past Surgical History:  Procedure Laterality Date   CATARACT EXTRACTION W/PHACO Left 10/31/2021   Procedure: CATARACT EXTRACTION PHACO AND INTRAOCULAR LENS PLACEMENT ([place]) LEFT VISION BLUE 3.45 00:48.0;  Surgeon: Leandrew Koyanagi, MD;  Location: [place];  Service: Ophthalmology;  Laterality: Left;   CATARACT EXTRACTION W/PHACO Right 11/14/2021   Procedure: CATARACT EXTRACTION PHACO AND INTRAOCULAR LENS PLACEMENT ([facility]) RIGHT 8.59 01:10.5;  Surgeon: Leandrew Koyanagi, MD;  Location: [place];  Service: Ophthalmology;  Laterality: Right;   INGUINAL HERNIA REPAIR Right 02/04/2017   Procedure: HERNIA REPAIR INGUINAL ADULT;  Surgeon: Leonie Green, MD;  Location: [facility];  Service: General;  Laterality: Right;   NO PAST SURGERIES     QUADRICEPS TENDON REPAIR Left 02/15/2020   Procedure: REPAIR QUADRICEP TENDON;  Surgeon: Corky Mull, MD;  Location: [facility];  Service: Orthopedics;  Laterality: Left;    Medications Prior to Admission  Medication Sig Dispense Refill Last Dose   aspirin EC 325 MG tablet Take 1 tablet (325 mg total) by mouth daily. 30 tablet 0 Past Week   losartan (COZAAR) 100 MG tablet Take 100 mg by mouth daily.   01/27/2022   torsemide (DEMADEX) 100 MG tablet Take 1 tablet by mouth. Take 1 tablet (100 mg total) by mouth daily   01/27/2022   Social History   Socioeconomic History   Marital  status: Single    Spouse name: Not on file   Number of children: Not on file   Years of education: Not on file   Highest education level: Not on file  Occupational History   Not on file  Tobacco Use   Smoking status: Former    Packs/day: 0.25    Years: 53.00    Pack years: 13.25    Types: Cigarettes    Quit date: 02/15/2015    Years since quitting: 6.9    Smokeless tobacco: Never  Substance and Sexual Activity   Alcohol use: Yes    Comment: occassional   Drug use: No   Sexual activity: Not on file  Other Topics Concern   Not on file  Social History Narrative   Not on file   Social Determinants of Health   Financial Resource Strain: Not on file  Food Insecurity: Not on file  Transportation Needs: Not on file  Physical Activity: Not on file  Stress: Not on file  Social Connections: Not on file  Intimate Partner Violence: Not on file    Family History  Problem Relation Age of Onset   Diabetes Mother    Diabetes Brother    Diabetes Maternal Grandmother    Diabetes Paternal Grandmother       Review of systems complete and found to be negative unless listed above    PHYSICAL EXAM General: ill appearing black male , well nourished, in no acute distress. Laying at angle in ICU bed, sleeping but arousable to voice.  HEENT:  Normocephalic and atraumatic. Poor dentition.  Neck:  No JVD.  Lungs: Normal respiratory effort on 3.5L by [place]. Clear bilaterally to auscultation anteriorly. No wheezes, crackles, rhonchi.  Heart: HRRR . Normal S1 and S2 without gallops or murmurs. Radial & DP pulses 2+ bilaterally. Abdomen: Distended. Nontender but tight.  Msk: Normal strength and tone for age. Extremities: Warm and well perfused. No clubbing, cyanosis. Bilateral 1+ LEE and tightness.  Neuro: Alert and oriented X 3. Psych:  Answers questions appropriately.   Labs:   Lab Results  Component Value Date   WBC 9.2 01/29/2022   HGB 16.3 01/29/2022   HCT 54.6 (H) 01/29/2022   MCV 105.6 (H) 01/29/2022   PLT 139 (L) 01/29/2022    Recent Labs  Lab 01/29/22 0404  NA 140  K 4.1  CL 99  CO2 34*  BUN 60*  CREATININE 3.09*  CALCIUM 8.7*  PROT 5.4*  BILITOT 1.4*  ALKPHOS 63  ALT 21  AST 23  GLUCOSE 126*   No results found for: CKTOTAL, CKMB, CKMBINDEX, TROPONINI No results found for: CHOL No results found for: HDL No results found  for: LDLCALC No results found for: TRIG No results found for: CHOLHDL No results found for: LDLDIRECT    Radiology: DG Chest Portable 1 View  Result Date: 01/28/2022 CLINICAL DATA:  Low oxygen saturation EXAM: PORTABLE CHEST 1 VIEW COMPARISON:  06/21/2020, CT 01/18/2022 FINDINGS: Cardiomegaly with central congestion. Streaky atelectasis or scarring at the bases. No pleural effusion or pneumothorax. Emphysematous disease. IMPRESSION: 1. Cardiomegaly with central congestion. 2. Hazy atelectasis or scarring right base 3. Emphysema Electronically Signed   By: Donavan Foil M.D.   On: 01/28/2022 17:20   ECHOCARDIOGRAM COMPLETE  Result Date: 01/29/2022    ECHOCARDIOGRAM REPORT   Patient Name:   John [name] Date of Exam: 01/29/2022 Medical Rec #:  [id number]      Height:       70.0 in Accession #:  [id number]     Weight:       243.4 lb Date of Birth:  1946/03/01     BSA:          2.269 m Patient Age:    89 years       BP:           88/55 mmHg Patient Gender: M              HR:           79 bpm. Exam Location:  [facility] Procedure: 2D Echo, Color Doppler and Cardiac Doppler Indications:     I50.21 congestive heart failure-Acute Systolic  History:         Patient has prior history of Echocardiogram examinations. CHF,                  COPD; Risk Factors:Hypertension.  Sonographer:     Charmayne Sheer Referring Phys:  [id number] Flora Lipps Diagnosing Phys: Serafina Royals MD  Sonographer Comments: Suboptimal apical window and no subcostal window. Image acquisition challenging due to COPD. IMPRESSIONS  1. Left ventricular ejection fraction, by estimation, is 55 to 60%. The left ventricle has normal function. The left ventricle has no regional wall motion abnormalities. Left ventricular diastolic parameters were normal.  2. Right ventricular systolic function is mildly reduced. The right ventricular size is severely enlarged. There is moderately elevated pulmonary artery systolic pressure.  3. Right atrial size was moderately  dilated.  4. The mitral valve is normal in structure. Trivial mitral valve regurgitation.  5. Tricuspid valve regurgitation is mild to moderate.  6. The aortic valve is normal in structure. Aortic valve regurgitation is not visualized. FINDINGS  Left Ventricle: Left ventricular ejection fraction, by estimation, is 55 to 60%. The left ventricle has normal function. The left ventricle has no regional wall motion abnormalities. The left ventricular internal cavity size was small. There is no left ventricular hypertrophy. Left ventricular diastolic parameters were normal. Right Ventricle: The right ventricular size is severely enlarged. No increase in right ventricular wall thickness. Right ventricular systolic function is mildly reduced. There is moderately elevated pulmonary artery systolic pressure. Left Atrium: Left atrial size was normal in size. Right Atrium: Right atrial size was moderately dilated. Pericardium: There is no evidence of pericardial effusion. Mitral Valve: The mitral valve is normal in structure. Trivial mitral valve regurgitation. MV peak gradient, 2.4 mmHg. The mean mitral valve gradient is 1.0 mmHg. Tricuspid Valve: The tricuspid valve is normal in structure. Tricuspid valve regurgitation is mild to moderate. Aortic Valve: The aortic valve is normal in structure. Aortic valve regurgitation is not visualized. Aortic valve mean gradient measures 4.0 mmHg. Aortic valve peak gradient measures 7.5 mmHg. Aortic valve area, by VTI measures 3.18 cm. Pulmonic Valve: The pulmonic valve was normal in structure. Pulmonic valve regurgitation is mild. Aorta: The aortic root and ascending aorta are structurally normal, with no evidence of dilitation. IAS/Shunts: No atrial level shunt detected by color flow Doppler.  LEFT VENTRICLE PLAX 2D LVIDd:         4.11 cm   Diastology LVIDs:         2.95 cm   LV e' medial:    4.79 cm/s LV PW:         1.19 cm   LV E/e' medial:  11.2 LV IVS:        1.07 cm   LV e'  lateral:   11.50 cm/s LVOT diam:     2.20 cm  LV E/e' lateral: 4.7 LV SV:         62 LV SV Index:   27 LVOT Area:     3.80 cm  RIGHT VENTRICLE RV Basal diam:  4.80 cm RV Mid diam:    5.53 cm RV S prime:     13.50 cm/s LEFT ATRIUM           Index        RIGHT ATRIUM           Index LA diam:      3.50 cm 1.54 cm/m   RA Area:     28.00 cm LA Vol (A4C): 34.4 ml 15.16 ml/m  RA Volume:   109.00 ml 48.04 ml/m  AORTIC VALVE                    PULMONIC VALVE AV Area (Vmax):    3.00 cm     PV Vmax:          1.06 m/s AV Area (Vmean):   2.96 cm     PV Vmean:         72.500 cm/s AV Area (VTI):     3.18 cm     PV VTI:           0.178 m AV Vmax:           137.00 cm/s  PV Peak grad:     4.5 mmHg AV Vmean:          89.500 cm/s  PV Mean grad:     2.0 mmHg AV VTI:            0.195 m      PR End Diast Vel: 3.72 msec AV Peak Grad:      7.5 mmHg AV Mean Grad:      4.0 mmHg LVOT Vmax:         108.00 cm/s LVOT Vmean:        69.800 cm/s LVOT VTI:          0.163 m LVOT/AV VTI ratio: 0.84  AORTA Ao Root diam: 4.00 cm MITRAL VALVE               TRICUSPID VALVE MV Area (PHT): 3.12 cm    TR Peak grad:   39.9 mmHg MV Area VTI:   2.94 cm    TR Vmax:        316.00 cm/s MV Peak grad:  2.4 mmHg MV Mean grad:  1.0 mmHg    SHUNTS MV Vmax:       0.77 m/s    Systemic VTI:  0.16 m MV Vmean:      56.0 cm/s   Systemic Diam: 2.20 cm MV Decel Time: 243 msec MV E velocity: 53.60 cm/s MV A velocity: 82.30 cm/s MV E/A ratio:  0.65 Serafina Royals MD Electronically signed by Serafina Royals MD Signature Date/Time: 01/29/2022/12:40:03 PM    Final    CT CHEST ABDOMEN PELVIS WO CONTRAST  Result Date: 01/21/2022 CLINICAL DATA:  Thrombocytopenia EXAM: CT CHEST, ABDOMEN AND PELVIS WITHOUT CONTRAST TECHNIQUE: Multidetector CT imaging of the chest, abdomen and pelvis was performed following the standard protocol without IV contrast. RADIATION DOSE REDUCTION: This exam was performed according to the departmental dose-optimization program which includes  automated exposure control, adjustment of the mA and/or kV according to patient size and/or use of iterative reconstruction technique. COMPARISON:  None. FINDINGS: CT CHEST FINDINGS Cardiovascular: Extensive multi-vessel coronary artery calcification. Cardiac size is within  normal limits. No pericardial effusion. The central pulmonary arteries are enlarged in keeping with changes of pulmonary arterial hypertension. Moderate atherosclerotic calcification within the thoracic aorta. The ascending aorta is dilated, measuring 5.1 cm in diameter, stable since prior examination. Descending thoracic aorta is of normal caliber. Mediastinum/Nodes: Visualized thyroid is unremarkable. No pathologic thoracic adenopathy. Esophagus unremarkable. Lungs/Pleura: Moderate emphysema. Mild bronchial wall thickening in keeping with airway inflammation. No focal pulmonary nodules or infiltrates identified. No pneumothorax or pleural effusion. No central obstructing lesion. Musculoskeletal: No acute bone abnormality. No lytic or blastic bone lesion. CT ABDOMEN PELVIS FINDINGS Hepatobiliary: No focal liver abnormality is seen. No gallstones, gallbladder wall thickening, or biliary dilatation. Pancreas: Unremarkable Spleen: Normal in size without focal abnormality. Adrenals/Urinary Tract: The adrenal glands are unremarkable. The kidneys are normal on this noncontrast examination. The bladder is circumferentially thick walled and there is extensive perivesicular inflammatory stranding suggesting changes of a diffuse infectious or inflammatory cystitis. The bladder is not distended. Stomach/Bowel: Moderate descending and sigmoid colonic diverticulosis without superimposed acute inflammatory change. The stomach, small bowel, and large bowel are otherwise unremarkable. Appendix normal. No free intraperitoneal gas. Trace ascites. Vascular/Lymphatic: 3.3 x 3.1 cm infrarenal abdominal aortic aneurysm is present. Superimposed moderate aortoiliac  atherosclerotic calcification. No pathologic adenopathy within the abdomen and pelvis. Reproductive: Marked prostatic enlargement is present within estimated prostatic volume of 123 cc. There is mild periprostatic inflammatory stranding also identified. Other: Mild diffuse body wall subcutaneous edema. Mild diffuse retroperitoneal edema. No abdominal wall hernia. Musculoskeletal: No acute bone abnormality. Degenerative changes are seen within the lumbar spine. No lytic or blastic bone lesion. IMPRESSION: No acute intrathoracic or intra-abdominal pathology identified. Extensive multi-vessel coronary artery calcification. Morphologic changes in keeping with pulmonary arterial hypertension. Ascending thoracic aortic aneurysm with maximal transaxial dimension of 5.1 cm, stable since prior examination of 09/01/2019. Ascending thoracic aortic aneurysm. Recommend semi-annual imaging followup by CTA or MRA and referral to cardiothoracic surgery if not already obtained. This recommendation follows 2010 ACCF/AHA/AATS/ACR/ASA/SCA/SCAI/SIR/STS/SVM Guidelines for the Diagnosis and Management of Patients With Thoracic Aortic Disease. Circulation. 2010; 121: H209-O709. Aortic aneurysm NOS (ICD10-I71.9) Moderate emphysema. Marked prostatic enlargement. Superimposed periprostatic and perivesicular inflammatory stranding suggesting changes of infectious or inflammatory cysto prostatitis. No evidence of secondary urinary retention. Moderate distal colonic diverticulosis without superimposed acute inflammatory change. 3.3 cm infrarenal abdominal aortic aneurysm. Recommend follow-up every 3 years. Reference: J Am Coll Radiol 6283;66:294-765. Mild anasarca with mild diffuse body wall subcutaneous edema, trace ascites, and retroperitoneal edema. Electronically Signed   By: Fidela Salisbury M.D.   On: 01/21/2022 01:29    ECHO 01/29/2022  1. Left ventricular ejection fraction, by estimation, is 55 to 60%. The  left ventricle has normal  function. The left ventricle has no regional  wall motion abnormalities. Left ventricular diastolic parameters were  normal.   2. Right ventricular systolic function is mildly reduced. The right  ventricular size is severely enlarged. There is moderately elevated  pulmonary artery systolic pressure.   3. Right atrial size was moderately dilated.   4. The mitral valve is normal in structure. Trivial mitral valve  regurgitation.   5. Tricuspid valve regurgitation is mild to moderate.   6. The aortic valve is normal in structure. Aortic valve regurgitation is  not visualized.   TELEMETRY reviewed by me:   EKG reviewed by me: NSR rate 82, RAE, PVC similar in appearance to prior  ASSESSMENT AND PLAN:  The patient is a 76 year old male with a past medical history significant for  HFpEF (LVEF 55-60% with severe RV enlargement and moderate RA dilation 01/29/22), hypertension, hyperlipidemia, CAD by CT scan, CKD 3, COPD, primary erythrocytosis who presented to same-day surgery for IV Lasix but was found to be hypotensive and hypoxic initially requiring supplemental oxygen by [place] and eventually placed on BiPAP. He was subsequently brought to [facility] ED 01/28/2022.  Cardiology is consulted for assistance with diuresis.  #Acute on chronic HFpEF (LVEF 55-60% with severe RV enlargement mod RA dilation) #Cardiogenic shock #COPD The patient was instructed by nephrology on 2/2 to have IV Lasix 80 mg treatments 3 times weekly for 2 weeks due to an increase in his weight.  He presented to same-day surgery for this treatment on 2/13 and was found to be hypotensive and hypoxic, requiring supplemental oxygen.  He was admitted to the ICU and started on Lasix gtt. and dopamine for blood pressure support.  His weight on admission is 243 pounds, up from his baseline from January 2023 of 228 reportedly. -Agree with Lasix gtt. appreciate critical care and nephrology assistance. -Wean dopamine as tolerated -Echocardiogram  resulted with improved EF but with severe RV enlargement, moderate RA dilation and mod elevated pulmonary artery pressure - suggesting more of a COPD/respiratory picture contributing to his presentation.  -recommend holding home losartan 100 mg and torsemide 100mg  for now -recommend strict I&Os, daily weights  #AKI on CKD 3 Baseline from 12/2021 at 1.87, 37. Today Cr is 3.06 and GFR 20.  -lasix gtt as above  -appreciate nephrology recommendations  This patient's plan of care was discussed and created with Dr. Nehemiah Massed and he is in agreement.  Signed: Tristan Schroeder , PA-C 01/29/2022, 1:28 PM [facility] Cardiology  The patient has been interviewed and examined. I agree with assessment and plan above. Serafina Royals MD [facility]

## 2022-01-29 NOTE — Progress Notes (Signed)
NAME:  John [name], MRN:  [id number], DOB:  August 02, 1946, LOS: 1 ADMISSION DATE:  01/28/2022, CONSULTATION DATE:  01/29/2022 REFERRING MD:  , CHIEF COMPLAINT:  Anasarca/Hypotension   History of Present Illness:  76yo AAM w/acute hypoxemic respiratory failure w/generalized swelling and anasarca w/hypotension. Patient presented to outpatient clinic on 2/13 for scheduled lasix therapy but was unable to receive due to hypotension w/systolics in the 20U. Patient then presented to the ED and was started on a Dopamine infusion to improve blood pressure and start Lasix gtt. The patient was placed on BIPAP to improve his oxygen saturation although according to chart has not been in any distress.   Pertinent  Medical History  COPD Chronic systolic heart failure EF 45% Hypertension Chronic Kidney Disease  Significant Hospital Events: Including procedures, antibiotic start and stop dates in addition to other pertinent events   2/13: Admitted to the ICU. Bipap, Dopamine and Lasix gtt 2/14: No acute events overnight. Remains on Lasix gtt. Net Negative 600cc. Off BIPAP doing well. Dopamine at 2.5, wean for MAP > 65  Interim History / Subjective:  Alert and oriented, in no distress this morning w/no complaints. VSS remains on Lasix gtt and Dopamine gtt. Weaning off Dopamine this morning. Off BIPAP doing well.   Objective   Blood pressure 115/76, pulse 72, temperature (!) 97.3 F (36.3 C), temperature source Axillary, resp. rate 19, height 5\' 10"  (1.778 m), weight 110.4 kg, SpO2 94 %.    FiO2 (%):  [30 %] 30 %   Intake/Output Summary (Last 24 hours) at 01/29/2022 0851 Last data filed at 01/29/2022 0700 Gross per 24 hour  Intake --  Output 900 ml  Net -900 ml   Filed Weights   01/28/22 2030 01/29/22 0500  Weight: 111 kg 110.4 kg    Examination: General: Alert and oriented x3, no distress HENT: Normocephalic, PERRLA Lungs: Lungs coarse and diminished throughout Cardiovascular: Heart sounds  S1S2, pitting edema of arms and legs, warm w/palpable pulses Abdomen: Soft, non-tender, rounded Extremities: Warm, no deformities or rashes Neuro: Non focal GU: Clear, adequate urine  Resolved Hospital Problem list   Acute hypoxemic respiratory failure COPD Exacerbation  Assessment & Plan:  Acute on Chronic systolic/diastolic heart failure --Last ECHO 2021: EF 45-50%, no wall motion abnormalities --Repeat ECHO pending --Continue Lasix gtt. Goal Net Negative 1L --Wean off Dopamine currently only at 2.5 --Cardiology following --GDMT held due to hypotension  Acute hypoxemic/hypercarbic respiratory failure COPD Exacerbation --Improving --Currently off BIPAP --PRN Duonebs --Not on home inhalers --Monitor mental status, if remains sleepy will obtain ABG  Acute on Chronic renal failure --Baseline Cr from 2021 1.4, 01/25/22 2.1 and now 3.1 --Currently on Lasix gtt 4mg /hr and diuresising well --Electrolytes stable and no need for dialysis at this time --BMP daily --Monitor I&Os --Avoid nephrotoxins --Nephrology following and added Albumin 12.5g IV q12hr   Best Practice (right click and "Reselect all SmartList Selections" daily)   Diet/type: Regular consistency (see orders) DVT prophylaxis: prophylactic heparin  GI prophylaxis: N/A Lines: N/A Foley:  N/A Code Status:  full code Last date of multidisciplinary goals of care discussion [NA]  Labs   CBC: Recent Labs  Lab 01/28/22 1603 01/28/22 2200 01/29/22 0404  WBC 11.5* 10.2 9.2  NEUTROABS 9.4*  --   --   HGB 17.0 17.5* 16.3  HCT 54.4* 56.7* 54.6*  MCV 102.1* 104.4* 105.6*  PLT 141* 144* 139*    Basic Metabolic Panel: Recent Labs  Lab 01/25/22 0820 01/28/22 1603 01/28/22 2200 01/29/22  0404  NA 141 137  --  140  K 4.0 4.0  --  4.1  CL 99 98  --  99  CO2 33* 29  --  34*  GLUCOSE 121* 115*  --  126*  BUN 40* 60*  --  60*  CREATININE 2.05* 3.17*  --  3.09*  CALCIUM 9.1 8.7*  --  8.7*  MG  --   --  2.5* 2.4   PHOS 4.0  --   --  5.6*   GFR: Estimated Creatinine Clearance: 25.3 mL/min (A) (by C-G formula based on SCr of 3.09 mg/dL (H)). Recent Labs  Lab 01/28/22 1603 01/28/22 2200 01/29/22 0005 01/29/22 0404  WBC 11.5* 10.2  --  9.2  LATICACIDVEN  --  1.2 1.7  --     Liver Function Tests: Recent Labs  Lab 01/25/22 0820 01/28/22 1603 01/29/22 0404  AST  --  25 23  ALT  --  21 21  ALKPHOS  --  72 63  BILITOT  --  2.1* 1.4*  PROT  --  5.6* 5.4*  ALBUMIN 3.1* 2.8* 2.8*   No results for input(s): LIPASE, AMYLASE in the last 168 hours. No results for input(s): AMMONIA in the last 168 hours.  ABG    Component Value Date/Time   PHART 7.33 (L) 09/01/2019 1029   PCO2ART 69 (HH) 09/01/2019 1029   PO2ART 84 09/01/2019 1029   HCO3 36.4 (H) 09/01/2019 1029   TCO2 31 02/15/2020 1034   O2SAT 95.5 09/01/2019 1029     Coagulation Profile: No results for input(s): INR, PROTIME in the last 168 hours.  Cardiac Enzymes: No results for input(s): CKTOTAL, CKMB, CKMBINDEX, TROPONINI in the last 168 hours.  HbA1C: No results found for: HGBA1C  CBG: Recent Labs  Lab 01/28/22 2038  GLUCAP 92    Review of Systems:   Negative unless stated in HPI  Past Medical History:  He,  has a past medical history of CHF (congestive heart failure) ([place]), COPD (chronic obstructive pulmonary disease) ([place]) (02/16/2020), and Hypertension.   Surgical History:   Past Surgical History:  Procedure Laterality Date   CATARACT EXTRACTION W/PHACO Left 10/31/2021   Procedure: CATARACT EXTRACTION PHACO AND INTRAOCULAR LENS PLACEMENT ([place]) LEFT VISION BLUE 3.45 00:48.0;  Surgeon: Leandrew Koyanagi, MD;  Location: [place];  Service: Ophthalmology;  Laterality: Left;   CATARACT EXTRACTION W/PHACO Right 11/14/2021   Procedure: CATARACT EXTRACTION PHACO AND INTRAOCULAR LENS PLACEMENT ([facility]) RIGHT 8.59 01:10.5;  Surgeon: Leandrew Koyanagi, MD;  Location: [place];  Service:  Ophthalmology;  Laterality: Right;   INGUINAL HERNIA REPAIR Right 02/04/2017   Procedure: HERNIA REPAIR INGUINAL ADULT;  Surgeon: Leonie Green, MD;  Location: [facility];  Service: General;  Laterality: Right;   NO PAST SURGERIES     QUADRICEPS TENDON REPAIR Left 02/15/2020   Procedure: REPAIR QUADRICEP TENDON;  Surgeon: Corky Mull, MD;  Location: [facility];  Service: Orthopedics;  Laterality: Left;     Social History:   reports that he quit smoking about 6 years ago. His smoking use included cigarettes. He has a 13.25 pack-year smoking history. He has never used smokeless tobacco. He reports current alcohol use. He reports that he does not use drugs.   Family History:  His family history includes Diabetes in his brother, maternal grandmother, mother, and paternal grandmother.   Allergies No Known Allergies   Home Medications  Prior to Admission medications   Medication Sig Start Date End Date Taking? Authorizing  Provider  aspirin 81 MG chewable tablet Chew 81 mg by mouth daily.   Yes [provider]  aspirin EC 325 MG tablet Take 1 tablet (325 mg total) by mouth daily. 02/16/20  Yes Lattie Corns, PA-C  losartan (COZAAR) 100 MG tablet Take 100 mg by mouth daily. 06/13/20  Yes [provider]  torsemide (DEMADEX) 100 MG tablet Take 1 tablet by mouth. Take 1 tablet (100 mg total) by mouth daily 02/05/21  Yes [provider]  brimonidine (ALPHAGAN) 0.2 % ophthalmic solution 1 drop 2 (two) times daily. 1 Drop(s) In Eye(s) Twice Daily Patient not taking: Reported on 01/11/2022 11/16/21   [provider]     Critical care time: 45 minutes      Tonye Royalty ACNP-BC

## 2022-01-29 NOTE — Progress Notes (Signed)
[place], [place] 01/29/22  Subjective:   Hospital day # 1  Patient known to our practice from outpatient follow-up.  He follows up for chronic kidney disease stage III.   His usual creatinine fluctuates between 1.5-1.7. He was scheduled to get IV furosemide in same-day surgery but could not receive it with. Blood pressure was in the 70s.  Therefore he was referred to emergency room for further evaluation. He was placed on oxygen supplementation for low oxygen sats in the blood. Overnight he was placed on furosemide IV infusion which has resulted in urine output of about 900 cc.  He also has increasing creatinine trends as noted below  Renal: 02/13 0701 - 02/14 0700 In: -  Out: 900 [Urine:900] Lab Results  Component Value Date   CREATININE 3.09 (H) 01/29/2022   CREATININE 3.17 (H) 01/28/2022   CREATININE 2.05 (H) 01/25/2022     Objective:  Vital signs in last 24 hours:  Temp:  [97.1 F (36.2 C)-98.7 F (37.1 C)] 98.2 F (36.8 C) (02/14 0800) Pulse Rate:  [60-103] 93 (02/14 0900) Resp:  [0-27] 24 (02/14 0900) BP: (62-137)/(40-88) 98/56 (02/14 0900) SpO2:  [51 %-100 %] 97 % (02/14 0900) FiO2 (%):  [30 %] 30 % (02/13 2030) Weight:  [110.4 kg-111 kg] 110.4 kg (02/14 0500)  Weight change:  Filed Weights   01/28/22 2030 01/29/22 0500  Weight: 111 kg 110.4 kg    Intake/Output:    Intake/Output Summary (Last 24 hours) at 01/29/2022 1215 Last data filed at 01/29/2022 1120 Gross per 24 hour  Intake --  Output 2000 ml  Net -2000 ml     Physical Exam: General: No acute distress, lying in the bed  HEENT Moist oral mucous membranes  Pulm/lungs Bilateral pulmonary crackles  CVS/Heart Distended neck veins, regular  Abdomen:  Soft, nontender  Extremities: Massive lower extremity edema bilaterally  Neurologic: Somnolent, answers only few simple questions  Skin: Warm, dry          Basic Metabolic Panel:  Recent Labs  Lab  01/25/22 0820 01/28/22 1603 01/28/22 2200 01/29/22 0404  NA 141 137  --  140  K 4.0 4.0  --  4.1  CL 99 98  --  99  CO2 33* 29  --  34*  GLUCOSE 121* 115*  --  126*  BUN 40* 60*  --  60*  CREATININE 2.05* 3.17*  --  3.09*  CALCIUM 9.1 8.7*  --  8.7*  MG  --   --  2.5* 2.4  PHOS 4.0  --   --  5.6*     CBC: Recent Labs  Lab 01/28/22 1603 01/28/22 2200 01/29/22 0404  WBC 11.5* 10.2 9.2  NEUTROABS 9.4*  --   --   HGB 17.0 17.5* 16.3  HCT 54.4* 56.7* 54.6*  MCV 102.1* 104.4* 105.6*  PLT 141* 144* 139*     No results found for: HEPBSAG, HEPBSAB, HEPBIGM    Microbiology:  Recent Results (from the past 240 hour(s))  Resp Panel by RT-PCR (Flu A&B, Covid) Nasopharyngeal Swab     Status: None   Collection Time: 01/28/22  7:08 PM   Specimen: Nasopharyngeal Swab; Nasopharyngeal(NP) swabs in vial transport medium  Result Value Ref Range Status   SARS Coronavirus 2 by RT PCR NEGATIVE NEGATIVE Final    Comment: (NOTE) SARS-CoV-2 target nucleic acids are NOT DETECTED.  The SARS-CoV-2 RNA is generally detectable in upper respiratory specimens during the acute phase of infection. The lowest  concentration of SARS-CoV-2 viral copies this assay can detect is 138 copies/mL. A negative result does not preclude SARS-Cov-2 infection and should not be used as the sole basis for treatment or other patient management decisions. A negative result may occur with  improper specimen collection/handling, submission of specimen other than nasopharyngeal swab, presence of viral mutation(s) within the areas targeted by this assay, and inadequate number of viral copies(<138 copies/mL). A negative result must be combined with clinical observations, patient history, and epidemiological information. The expected result is Negative.  Fact Sheet for Patients:  EntrepreneurPulse.com.au  Fact Sheet for Healthcare Providers:  IncredibleEmployment.be  This test  is no t yet approved or cleared by the [place] FDA and  has been authorized for detection and/or diagnosis of SARS-CoV-2 by FDA under an Emergency Use Authorization (EUA). This EUA will remain  in effect (meaning this test can be used) for the duration of the COVID-19 declaration under Section 564(b)(1) of the Act, 21 U.S.C.section 360bbb-3(b)(1), unless the authorization is terminated  or revoked sooner.       Influenza A by PCR NEGATIVE NEGATIVE Final   Influenza B by PCR NEGATIVE NEGATIVE Final    Comment: (NOTE) The Xpert Xpress SARS-CoV-2/FLU/RSV plus assay is intended as an aid in the diagnosis of influenza from Nasopharyngeal swab specimens and should not be used as a sole basis for treatment. Nasal washings and aspirates are unacceptable for Xpert Xpress SARS-CoV-2/FLU/RSV testing.  Fact Sheet for Patients: EntrepreneurPulse.com.au  Fact Sheet for Healthcare Providers: IncredibleEmployment.be  This test is not yet approved or cleared by the [place] FDA and has been authorized for detection and/or diagnosis of SARS-CoV-2 by FDA under an Emergency Use Authorization (EUA). This EUA will remain in effect (meaning this test can be used) for the duration of the COVID-19 declaration under Section 564(b)(1) of the Act, 21 U.S.C. section 360bbb-3(b)(1), unless the authorization is terminated or revoked.  Performed at [facility] [address] [place], [place] [address]   MRSA Next Gen by PCR, Nasal     Status: None   Collection Time: 01/28/22  8:47 PM   Specimen: Nasal Mucosa; Nasal Swab  Result Value Ref Range Status   MRSA by PCR Next Gen NOT DETECTED NOT DETECTED Final    Comment: (NOTE) The GeneXpert MRSA Assay (FDA approved for NASAL specimens only), is one component of a comprehensive MRSA colonization surveillance program. It is not intended to diagnose MRSA infection nor to guide or monitor treatment for  MRSA infections. Test performance is not FDA approved in patients less than 82 years old. Performed at [facility], [address] [place], [place] [address]     Coagulation Studies: No results for input(s): LABPROT, INR in the last 72 hours.  Urinalysis: No results for input(s): COLORURINE, LABSPEC, PHURINE, GLUCOSEU, HGBUR, BILIRUBINUR, KETONESUR, PROTEINUR, UROBILINOGEN, NITRITE, LEUKOCYTESUR in the last 72 hours.  Invalid input(s): APPERANCEUR    Imaging: DG Chest Portable 1 View  Result Date: 01/28/2022 CLINICAL DATA:  Low oxygen saturation EXAM: PORTABLE CHEST 1 VIEW COMPARISON:  06/21/2020, CT 01/18/2022 FINDINGS: Cardiomegaly with central congestion. Streaky atelectasis or scarring at the bases. No pleural effusion or pneumothorax. Emphysematous disease. IMPRESSION: 1. Cardiomegaly with central congestion. 2. Hazy atelectasis or scarring right base 3. Emphysema Electronically Signed   By: Donavan Foil M.D.   On: 01/28/2022 17:20     Medications:    sodium chloride     albumin human 12.5 g (01/29/22 1130)   DOPamine 2.5  mcg/kg/min (01/29/22 0504)   furosemide (LASIX) 200 mg in dextrose 5% 100 mL (2mg /mL) infusion 8 mg/hr (01/28/22 2106)    Chlorhexidine Gluconate Cloth  6 each Topical Q0600   heparin  5,000 Units Subcutaneous Q8H   midodrine  5 mg Oral TID WC   sodium chloride flush  3 mL Intravenous Q12H   sodium chloride, acetaminophen, docusate sodium, ondansetron (ZOFRAN) IV, polyethylene glycol, sodium chloride flush  Assessment/ Plan:  76 y.o. male with chronic kidney disease stage IIIa/B, hypertension, chronic systolic CHF, erythrocytosis requiring therapeutic phlebotomies, AAA, Prostate enlargement, Emphysema   admitted on 01/28/2022 for Acute respiratory failure with hypoxia (HCC) [J96.01]  2D echo-June 22, 2020-LVEF 45 to 50%, normal right ventricular and pulmonary artery systolic pressure 2D echo-January 29, 2022-results pending  #Acute kidney  injury #Chronic kidney disease stage IIIb, baseline creatinine 1.76/GFR 40 from November 19, 2021 # Anasarca # hypoalbuminemia # Hypotension  Plan: Agree with iv furosemide infusion Awaiting new echo results Avoid hypotension. Required pressors last night Agree with midodrine, low dose iv albumin supplementation    LOS: 1 Toniqua Melamed 2/14/202312:15 PM  [place], [place]  Note: This note was prepared with Dragon dictation. Any transcription errors are unintentional

## 2022-01-30 ENCOUNTER — Ambulatory Visit: Payer: Medicare HMO

## 2022-01-30 DIAGNOSIS — J9601 Acute respiratory failure with hypoxia: Secondary | ICD-10-CM | POA: Diagnosis not present

## 2022-01-30 LAB — BASIC METABOLIC PANEL
Anion gap: 7 (ref 5–15)
Anion gap: 7 (ref 5–15)
Anion gap: 7 (ref 5–15)
BUN: 47 mg/dL — ABNORMAL HIGH (ref 8–23)
BUN: 44 mg/dL — ABNORMAL HIGH (ref 8–23)
BUN: 42 mg/dL — ABNORMAL HIGH (ref 8–23)
CO2: 35 mmol/L — ABNORMAL HIGH (ref 22–32)
CO2: 37 mmol/L — ABNORMAL HIGH (ref 22–32)
CO2: 38 mmol/L — ABNORMAL HIGH (ref 22–32)
Calcium: 8.9 mg/dL (ref 8.9–10.3)
Calcium: 9.1 mg/dL (ref 8.9–10.3)
Calcium: 9.1 mg/dL (ref 8.9–10.3)
Chloride: 101 mmol/L (ref 98–111)
Chloride: 99 mmol/L (ref 98–111)
Chloride: 97 mmol/L — ABNORMAL LOW (ref 98–111)
Creatinine, Ser: 2.47 mg/dL — ABNORMAL HIGH (ref 0.61–1.24)
Creatinine, Ser: 2.47 mg/dL — ABNORMAL HIGH (ref 0.61–1.24)
Creatinine, Ser: 2.26 mg/dL — ABNORMAL HIGH (ref 0.61–1.24)
GFR, Estimated: 26 mL/min — ABNORMAL LOW (ref >60)
GFR, Estimated: 26 mL/min — ABNORMAL LOW (ref >60)
GFR, Estimated: 29 mL/min — ABNORMAL LOW (ref >60)
Glucose, Bld: 111 mg/dL — ABNORMAL HIGH (ref 70–99)
Glucose, Bld: 122 mg/dL — ABNORMAL HIGH (ref 70–99)
Glucose, Bld: 97 mg/dL (ref 70–99)
Potassium: 4.3 mmol/L (ref 3.5–5.1)
Potassium: 4.4 mmol/L (ref 3.5–5.1)
Potassium: 3.8 mmol/L (ref 3.5–5.1)
Sodium: 143 mmol/L (ref 135–145)
Sodium: 143 mmol/L (ref 135–145)
Sodium: 142 mmol/L (ref 135–145)

## 2022-01-30 LAB — URINALYSIS, COMPLETE (UACMP) WITH MICROSCOPIC
Bilirubin Urine: NEGATIVE
Glucose, UA: NEGATIVE mg/dL
Hgb urine dipstick: NEGATIVE
Ketones, ur: NEGATIVE mg/dL
Leukocytes,Ua: NEGATIVE
Nitrite: NEGATIVE
Protein, ur: NEGATIVE mg/dL
Specific Gravity, Urine: 1.008 (ref 1.005–1.030)
pH: 5 (ref 5.0–8.0)

## 2022-01-30 LAB — URINE DRUG SCREEN, QUALITATIVE (ARMC ONLY)
Amphetamines, Ur Screen: NOT DETECTED
Barbiturates, Ur Screen: NOT DETECTED
Benzodiazepine, Ur Scrn: NOT DETECTED
Cannabinoid 50 Ng, Ur ~~LOC~~: NOT DETECTED
Cocaine Metabolite,Ur ~~LOC~~: NOT DETECTED
MDMA (Ecstasy)Ur Screen: NOT DETECTED
Methadone Scn, Ur: NOT DETECTED
Opiate, Ur Screen: NOT DETECTED
Phencyclidine (PCP) Ur S: NOT DETECTED
Tricyclic, Ur Screen: NOT DETECTED

## 2022-01-30 LAB — MAGNESIUM: Magnesium: 2.4 mg/dL (ref 1.7–2.4)

## 2022-01-30 NOTE — Progress Notes (Signed)
[place] for Electrolyte Monitoring and Replacement   Recent Labs: Potassium (mmol/L)  Date Value  01/30/2022 4.4   Magnesium (mg/dL)  Date Value  01/30/2022 2.4   Calcium (mg/dL)  Date Value  01/30/2022 9.1   Albumin (g/dL)  Date Value  01/29/2022 2.8 (L)   Phosphorus (mg/dL)  Date Value  01/29/2022 5.6 (H)   Sodium (mmol/L)  Date Value  01/30/2022 143   Corrected Ca: 9.66 mg/dL  Assessment: 75yo AAM w/acute hypoxemic respiratory failure w/generalized swelling and anasarca w/hypotension. Pharmacy is asked to follow and replace electrolytes while in the CCU.  Goal of Therapy:  Electrolytes WNL  Plan:  No electrolyte replacement warranted today Because this consult was generated as part of the CCU admission order we will sign out for now  Dallie Piles ,PharmD Clinical Pharmacist 01/30/2022 7:20 AM

## 2022-01-30 NOTE — Progress Notes (Signed)
[place], [place] 01/30/22  Subjective:   Hospital day # 2  Patient known to our practice from outpatient follow-up.  He follows up for chronic kidney disease stage III.  His usual creatinine fluctuates between 1.5-1.7. He was scheduled to get IV furosemide in same-day surgery but could not receive it because Blood pressure was in the 70s.    This morning, patient appears well.  Serum creatinine has started to trend lower.  Urine output Is good at 2850 cc  Renal: 02/14 0701 - 02/15 0700 In: 1571.6 [P.O.:1430; I.V.:103.4; IV Piggyback:38.2] Out: 2850 [Urine:2850] Lab Results  Component Value Date   CREATININE 2.26 (H) 01/30/2022   CREATININE 2.47 (H) 01/30/2022   CREATININE 2.47 (H) 01/30/2022     Objective:  Vital signs in last 24 hours:  Temp:  [97.8 F (36.6 C)-100.1 F (37.8 C)] 100.1 F (37.8 C) (02/15 0730) Pulse Rate:  [85-91] 88 (02/15 0900) Resp:  [20-28] 21 (02/15 0900) BP: (93-136)/(60-103) 125/71 (02/15 0900) SpO2:  [95 %-100 %] 97 % (02/15 0900) Weight:  [110.5 kg] 110.5 kg (02/15 0500)  Weight change: -0.5 kg Filed Weights   01/28/22 2030 01/29/22 0500 01/30/22 0500  Weight: 111 kg 110.4 kg 110.5 kg    Intake/Output:    Intake/Output Summary (Last 24 hours) at 01/30/2022 1221 Last data filed at 01/30/2022 0800 Gross per 24 hour  Intake 1331.59 ml  Output 1450 ml  Net -118.41 ml      Physical Exam: General: No acute distress, lying in the bed  HEENT Moist oral mucous membranes  Pulm/lungs Bilateral pulmonary crackles-improved  CVS/Heart regular  Abdomen:  Soft, nontender  Extremities: Massive lower extremity edema bilaterally  Neurologic: Alert, able to answer few simple questions  Skin: Warm, dry          Basic Metabolic Panel:  Recent Labs  Lab 01/25/22 0820 01/28/22 1603 01/28/22 2200 01/29/22 0404 01/29/22 1812 01/30/22 0232 01/30/22 0451 01/30/22 1109  NA 141   < >  --  140 142 143 143 142  K  4.0   < >  --  4.1 4.6 4.3 4.4 3.8  CL 99   < >  --  99 100 101 99 97*  CO2 33*   < >  --  34* 34* 35* 37* 38*  GLUCOSE 121*   < >  --  126* 115* 111* 122* 97  BUN 40*   < >  --  60* 55* 47* 44* 42*  CREATININE 2.05*   < >  --  3.09* 2.70* 2.47* 2.47* 2.26*  CALCIUM 9.1   < >  --  8.7* 9.0 8.9 9.1 9.1  MG  --   --  2.5* 2.4  --   --  2.4  --   PHOS 4.0  --   --  5.6*  --   --   --   --    < > = values in this interval not displayed.      CBC: Recent Labs  Lab 01/28/22 1603 01/28/22 2200 01/29/22 0404  WBC 11.5* 10.2 9.2  NEUTROABS 9.4*  --   --   HGB 17.0 17.5* 16.3  HCT 54.4* 56.7* 54.6*  MCV 102.1* 104.4* 105.6*  PLT 141* 144* 139*      No results found for: HEPBSAG, HEPBSAB, HEPBIGM    Microbiology:  Recent Results (from the past 240 hour(s))  Resp Panel by RT-PCR (Flu A&B, Covid) Nasopharyngeal Swab     Status:  None   Collection Time: 01/28/22  7:08 PM   Specimen: Nasopharyngeal Swab; Nasopharyngeal(NP) swabs in vial transport medium  Result Value Ref Range Status   SARS Coronavirus 2 by RT PCR NEGATIVE NEGATIVE Final    Comment: (NOTE) SARS-CoV-2 target nucleic acids are NOT DETECTED.  The SARS-CoV-2 RNA is generally detectable in upper respiratory specimens during the acute phase of infection. The lowest concentration of SARS-CoV-2 viral copies this assay can detect is 138 copies/mL. A negative result does not preclude SARS-Cov-2 infection and should not be used as the sole basis for treatment or other patient management decisions. A negative result may occur with  improper specimen collection/handling, submission of specimen other than nasopharyngeal swab, presence of viral mutation(s) within the areas targeted by this assay, and inadequate number of viral copies(<138 copies/mL). A negative result must be combined with clinical observations, patient history, and epidemiological information. The expected result is Negative.  Fact Sheet for Patients:   EntrepreneurPulse.com.au  Fact Sheet for Healthcare Providers:  IncredibleEmployment.be  This test is no t yet approved or cleared by the [place] FDA and  has been authorized for detection and/or diagnosis of SARS-CoV-2 by FDA under an Emergency Use Authorization (EUA). This EUA will remain  in effect (meaning this test can be used) for the duration of the COVID-19 declaration under Section 564(b)(1) of the Act, 21 U.S.C.section 360bbb-3(b)(1), unless the authorization is terminated  or revoked sooner.       Influenza A by PCR NEGATIVE NEGATIVE Final   Influenza B by PCR NEGATIVE NEGATIVE Final    Comment: (NOTE) The Xpert Xpress SARS-CoV-2/FLU/RSV plus assay is intended as an aid in the diagnosis of influenza from Nasopharyngeal swab specimens and should not be used as a sole basis for treatment. Nasal washings and aspirates are unacceptable for Xpert Xpress SARS-CoV-2/FLU/RSV testing.  Fact Sheet for Patients: EntrepreneurPulse.com.au  Fact Sheet for Healthcare Providers: IncredibleEmployment.be  This test is not yet approved or cleared by the [place] FDA and has been authorized for detection and/or diagnosis of SARS-CoV-2 by FDA under an Emergency Use Authorization (EUA). This EUA will remain in effect (meaning this test can be used) for the duration of the COVID-19 declaration under Section 564(b)(1) of the Act, 21 U.S.C. section 360bbb-3(b)(1), unless the authorization is terminated or revoked.  Performed at [facility], [address] [place], [place] [address]   MRSA Next Gen by PCR, Nasal     Status: None   Collection Time: 01/28/22  8:47 PM   Specimen: Nasal Mucosa; Nasal Swab  Result Value Ref Range Status   MRSA by PCR Next Gen NOT DETECTED NOT DETECTED Final    Comment: (NOTE) The GeneXpert MRSA Assay (FDA approved for NASAL specimens only), is one component of a  comprehensive MRSA colonization surveillance program. It is not intended to diagnose MRSA infection nor to guide or monitor treatment for MRSA infections. Test performance is not FDA approved in patients less than 32 years old. Performed at [facility], [address] [place],  [address]     Coagulation Studies: No results for input(s): LABPROT, INR in the last 72 hours.  Urinalysis: No results for input(s): COLORURINE, LABSPEC, PHURINE, GLUCOSEU, HGBUR, BILIRUBINUR, KETONESUR, PROTEINUR, UROBILINOGEN, NITRITE, LEUKOCYTESUR in the last 72 hours.  Invalid input(s): APPERANCEUR    Imaging: DG Chest Portable 1 View  Result Date: 01/28/2022 CLINICAL DATA:  Low oxygen saturation EXAM: PORTABLE CHEST 1 VIEW COMPARISON:  06/21/2020, CT 01/18/2022 FINDINGS: Cardiomegaly with central  congestion. Streaky atelectasis or scarring at the bases. No pleural effusion or pneumothorax. Emphysematous disease. IMPRESSION: 1. Cardiomegaly with central congestion. 2. Hazy atelectasis or scarring right base 3. Emphysema Electronically Signed   By: Donavan Foil M.D.   On: 01/28/2022 17:20   ECHOCARDIOGRAM COMPLETE  Result Date: 01/29/2022    ECHOCARDIOGRAM REPORT   Patient Name:   Ravis Bosch Date of Exam: 01/29/2022 Medical Rec #:  [id number]      Height:       70.0 in Accession #:    [id number]     Weight:       243.4 lb Date of Birth:  Apr 16, 1946     BSA:          2.269 m Patient Age:    76 years       BP:           88/55 mmHg Patient Gender: M              HR:           79 bpm. Exam Location:  [facility] Procedure: 2D Echo, Color Doppler and Cardiac Doppler Indications:     I50.21 congestive heart failure-Acute Systolic  History:         Patient has prior history of Echocardiogram examinations. CHF,                  COPD; Risk Factors:Hypertension.  Sonographer:     Charmayne Sheer Referring Phys:  [id number] Flora Lipps Diagnosing Phys: Serafina Royals MD  Sonographer Comments: Suboptimal apical window  and no subcostal window. Image acquisition challenging due to COPD. IMPRESSIONS  1. Left ventricular ejection fraction, by estimation, is 55 to 60%. The left ventricle has normal function. The left ventricle has no regional wall motion abnormalities. Left ventricular diastolic parameters were normal.  2. Right ventricular systolic function is mildly reduced. The right ventricular size is severely enlarged. There is moderately elevated pulmonary artery systolic pressure.  3. Right atrial size was moderately dilated.  4. The mitral valve is normal in structure. Trivial mitral valve regurgitation.  5. Tricuspid valve regurgitation is mild to moderate.  6. The aortic valve is normal in structure. Aortic valve regurgitation is not visualized. FINDINGS  Left Ventricle: Left ventricular ejection fraction, by estimation, is 55 to 60%. The left ventricle has normal function. The left ventricle has no regional wall motion abnormalities. The left ventricular internal cavity size was small. There is no left ventricular hypertrophy. Left ventricular diastolic parameters were normal. Right Ventricle: The right ventricular size is severely enlarged. No increase in right ventricular wall thickness. Right ventricular systolic function is mildly reduced. There is moderately elevated pulmonary artery systolic pressure. Left Atrium: Left atrial size was normal in size. Right Atrium: Right atrial size was moderately dilated. Pericardium: There is no evidence of pericardial effusion. Mitral Valve: The mitral valve is normal in structure. Trivial mitral valve regurgitation. MV peak gradient, 2.4 mmHg. The mean mitral valve gradient is 1.0 mmHg. Tricuspid Valve: The tricuspid valve is normal in structure. Tricuspid valve regurgitation is mild to moderate. Aortic Valve: The aortic valve is normal in structure. Aortic valve regurgitation is not visualized. Aortic valve mean gradient measures 4.0 mmHg. Aortic valve peak gradient measures 7.5  mmHg. Aortic valve area, by VTI measures 3.18 cm. Pulmonic Valve: The pulmonic valve was normal in structure. Pulmonic valve regurgitation is mild. Aorta: The aortic root and ascending aorta are structurally normal, with no evidence of dilitation. IAS/Shunts: No atrial level  shunt detected by color flow Doppler.  LEFT VENTRICLE PLAX 2D LVIDd:         4.11 cm   Diastology LVIDs:         2.95 cm   LV e' medial:    4.79 cm/s LV PW:         1.19 cm   LV E/e' medial:  11.2 LV IVS:        1.07 cm   LV e' lateral:   11.50 cm/s LVOT diam:     2.20 cm   LV E/e' lateral: 4.7 LV SV:         62 LV SV Index:   27 LVOT Area:     3.80 cm  RIGHT VENTRICLE RV Basal diam:  4.80 cm RV Mid diam:    5.53 cm RV S prime:     13.50 cm/s LEFT ATRIUM           Index        RIGHT ATRIUM           Index LA diam:      3.50 cm 1.54 cm/m   RA Area:     28.00 cm LA Vol (A4C): 34.4 ml 15.16 ml/m  RA Volume:   109.00 ml 48.04 ml/m  AORTIC VALVE                    PULMONIC VALVE AV Area (Vmax):    3.00 cm     PV Vmax:          1.06 m/s AV Area (Vmean):   2.96 cm     PV Vmean:         72.500 cm/s AV Area (VTI):     3.18 cm     PV VTI:           0.178 m AV Vmax:           137.00 cm/s  PV Peak grad:     4.5 mmHg AV Vmean:          89.500 cm/s  PV Mean grad:     2.0 mmHg AV VTI:            0.195 m      PR End Diast Vel: 3.72 msec AV Peak Grad:      7.5 mmHg AV Mean Grad:      4.0 mmHg LVOT Vmax:         108.00 cm/s LVOT Vmean:        69.800 cm/s LVOT VTI:          0.163 m LVOT/AV VTI ratio: 0.84  AORTA Ao Root diam: 4.00 cm MITRAL VALVE               TRICUSPID VALVE MV Area (PHT): 3.12 cm    TR Peak grad:   39.9 mmHg MV Area VTI:   2.94 cm    TR Vmax:        316.00 cm/s MV Peak grad:  2.4 mmHg MV Mean grad:  1.0 mmHg    SHUNTS MV Vmax:       0.77 m/s    Systemic VTI:  0.16 m MV Vmean:      56.0 cm/s   Systemic Diam: 2.20 cm MV Decel Time: 243 msec MV E velocity: 53.60 cm/s MV A velocity: 82.30 cm/s MV E/A ratio:  0.65 Serafina Royals MD  Electronically signed by Serafina Royals MD Signature Date/Time: 01/29/2022/12:40:03 PM    Final  Medications:    sodium chloride     albumin human 12.5 g (01/30/22 1020)   DOPamine Stopped (01/29/22 0901)   furosemide (LASIX) 200 mg in dextrose 5% 100 mL (2mg /mL) infusion 8 mg/hr (01/29/22 2134)    Chlorhexidine Gluconate Cloth  6 each Topical Q0600   heparin  5,000 Units Subcutaneous Q8H   mouth rinse  15 mL Mouth Rinse BID   midodrine  5 mg Oral TID WC   sodium chloride flush  3 mL Intravenous Q12H   sodium chloride, acetaminophen, docusate sodium, ondansetron (ZOFRAN) IV, polyethylene glycol, sodium chloride flush  Assessment/ Plan:  76 y.o. male with chronic kidney disease stage IIIa/B, hypertension, chronic systolic CHF, erythrocytosis requiring therapeutic phlebotomies, AAA, Prostate enlargement, Emphysema, pulmonary hypertension admitted on 01/28/2022 for Acute respiratory failure with hypoxia (HCC) [J96.01]  2D echo-June 22, 2020-LVEF 45 to 50%, normal right ventricular and pulmonary artery systolic pressure 2D echo-January 29, 2022-results show LVEF is normal at 55 to 60% with normal diastolic parameters however right ventricular size is severely enlarged and there is moderately elevated pulmonary artery systolic pressure and right atrial size is moderately dilated.  #Acute kidney injury #Chronic kidney disease stage IIIb, baseline creatinine 1.76/GFR 40 from November 19, 2021 # Anasarca # hypoalbuminemia # Hypotension #Pulmonary hypertension  Acute kidney injury likely secondary to volume shifts.  Patient has severe pulmonary hypertension as noted in the 2D echo.  He also has severe volume overload.  Plan: Agree with iv furosemide infusion for volume optimization. Avoid hypotension. Agree with midodrine, low dose iv albumin supplementation Supportive care    LOS: 2 John [name] 2/15/202312:21 PM  [place],  [place]  Note: This note was prepared with Dragon dictation. Any transcription errors are unintentional

## 2022-01-30 NOTE — Progress Notes (Signed)
Patient alert and oriented x4,NSR, on 4L via [place]. Patient pressures are stable. Patient able to stand with moderate assist at bedside. Patient ambulated from bed to chair with PT today, tolerated well. Bed alarm and chair alarms in place. Shift report given to floor RN.

## 2022-01-30 NOTE — Progress Notes (Signed)
[place] NOTE       Patient ID: John [name] MRN: [id number] DOB/AGE: [birth year] 76 y.o.  Admit date: 01/28/2022 Referring Physician Dr. Flora Lipps Primary Physician Dr. Tracie Harrier Primary Cardiologist Nehemiah Massed Reason for Consultation CHF  HPI: The patient is a 76 year old male with a past medical history significant for HFpEF (LVEF 55-60% with severe RV enlargement and moderate RA dilation 01/29/22), hypertension, hyperlipidemia, CAD by CT scan, CKD 3, COPD, primary erythrocytosis who presented to same-day surgery for IV Lasix but was found to be hypotensive and hypoxic initially requiring supplemental oxygen by [place] and eventually placed on BiPAP. He was subsequently brought to [facility] ED 01/28/2022.  Cardiology is consulted for assistance with diuresis.  Interval history:  -Still very somnolent during interview.  States he feels about the same as yesterday.  No chest pain, shortness of breath. -Renal function slowly improving with diuresis.  Off dopamine since yesterday morning and on midodrine 3 times daily maintaining blood pressure.  Review of systems complete and found to be negative unless listed above    Past Medical History:  Diagnosis Date   CHF (congestive heart failure) (HCC)    EF 45% in 2021   COPD (chronic obstructive pulmonary disease) ([place]) 02/16/2020   Hypertension     Past Surgical History:  Procedure Laterality Date   CATARACT EXTRACTION W/PHACO Left 10/31/2021   Procedure: CATARACT EXTRACTION PHACO AND INTRAOCULAR LENS PLACEMENT ([place]) LEFT VISION BLUE 3.45 00:48.0;  Surgeon: Leandrew Koyanagi, MD;  Location: [facility];  Service: Ophthalmology;  Laterality: Left;   CATARACT EXTRACTION W/PHACO Right 11/14/2021   Procedure: CATARACT EXTRACTION PHACO AND INTRAOCULAR LENS PLACEMENT ([facility]) RIGHT 8.59 01:10.5;  Surgeon: Leandrew Koyanagi, MD;  Location: [place];  Service: Ophthalmology;  Laterality: Right;    INGUINAL HERNIA REPAIR Right 02/04/2017   Procedure: HERNIA REPAIR INGUINAL ADULT;  Surgeon: Leonie Green, MD;  Location: [facility];  Service: General;  Laterality: Right;   NO PAST SURGERIES     QUADRICEPS TENDON REPAIR Left 02/15/2020   Procedure: REPAIR QUADRICEP TENDON;  Surgeon: Corky Mull, MD;  Location: [facility];  Service: Orthopedics;  Laterality: Left;    Medications Prior to Admission  Medication Sig Dispense Refill Last Dose   aspirin EC 325 MG tablet Take 1 tablet (325 mg total) by mouth daily. 30 tablet 0 Past Week   losartan (COZAAR) 100 MG tablet Take 100 mg by mouth daily.   01/27/2022   torsemide (DEMADEX) 100 MG tablet Take 1 tablet by mouth. Take 1 tablet (100 mg total) by mouth daily   01/27/2022   Social History   Socioeconomic History   Marital status: Single    Spouse name: Not on file   Number of children: Not on file   Years of education: Not on file   Highest education level: Not on file  Occupational History   Not on file  Tobacco Use   Smoking status: Former    Packs/day: 0.25    Years: 53.00    Pack years: 13.25    Types: Cigarettes    Quit date: 02/15/2015    Years since quitting: 6.9   Smokeless tobacco: Never  Substance and Sexual Activity   Alcohol use: Yes    Comment: occassional   Drug use: No   Sexual activity: Not on file  Other Topics Concern   Not on file  Social History Narrative   Not on file   Social Determinants of Health   Financial  Resource Strain: Not on file  Food Insecurity: Not on file  Transportation Needs: Not on file  Physical Activity: Not on file  Stress: Not on file  Social Connections: Not on file  Intimate Partner Violence: Not on file    Family History  Problem Relation Age of Onset   Diabetes Mother    Diabetes Brother    Diabetes Maternal Grandmother    Diabetes Paternal Grandmother       Review of systems complete and found to be negative unless listed above    PHYSICAL EXAM General: ill  appearing black male, grossly edematous appearing, in no acute distress. Laying at incline ICU bed, sleeping but arousable to voice.  HEENT:  Normocephalic and atraumatic. Poor dentition.  Neck:  No JVD.  Lungs: Normal respiratory effort on oxygen by [place].  Coarse breath sounds anteriorly  heart: HRRR . Normal S1 and S2 without gallops or murmurs. Radial & DP pulses 2+ bilaterally. Abdomen: Distended. Nontender but tight.  Msk: Normal strength and tone for age. Extremities: Warm and well perfused. No clubbing, cyanosis. Bilateral 1+ LEE and tightness, improved slightly from yesterday. Neuro: Somnolent, answers simple questions. Psych:  Answers simple questions .   Labs:   Lab Results  Component Value Date   WBC 9.2 01/29/2022   HGB 16.3 01/29/2022   HCT 54.6 (H) 01/29/2022   MCV 105.6 (H) 01/29/2022   PLT 139 (L) 01/29/2022    Recent Labs  Lab 01/29/22 0404 01/29/22 1812 01/30/22 0451  NA 140   < > 143  K 4.1   < > 4.4  CL 99   < > 99  CO2 34*   < > 37*  BUN 60*   < > 44*  CREATININE 3.09*   < > 2.47*  CALCIUM 8.7*   < > 9.1  PROT 5.4*  --   --   BILITOT 1.4*  --   --   ALKPHOS 63  --   --   ALT 21  --   --   AST 23  --   --   GLUCOSE 126*   < > 122*   < > = values in this interval not displayed.    No results found for: CKTOTAL, CKMB, CKMBINDEX, TROPONINI No results found for: CHOL No results found for: HDL No results found for: LDLCALC No results found for: TRIG No results found for: CHOLHDL No results found for: LDLDIRECT    Radiology: DG Chest Portable 1 View  Result Date: 01/28/2022 CLINICAL DATA:  Low oxygen saturation EXAM: PORTABLE CHEST 1 VIEW COMPARISON:  06/21/2020, CT 01/18/2022 FINDINGS: Cardiomegaly with central congestion. Streaky atelectasis or scarring at the bases. No pleural effusion or pneumothorax. Emphysematous disease. IMPRESSION: 1. Cardiomegaly with central congestion. 2. Hazy atelectasis or scarring right base 3. Emphysema Electronically  Signed   By: Donavan Foil M.D.   On: 01/28/2022 17:20   ECHOCARDIOGRAM COMPLETE  Result Date: 01/29/2022    ECHOCARDIOGRAM REPORT   Patient Name:   John [name] Date of Exam: 01/29/2022 Medical Rec #:  [id number]      Height:       70.0 in Accession #:    [id number]     Weight:       243.4 lb Date of Birth:  1946-03-11     BSA:          2.269 m Patient Age:    89 years       BP:  88/55 mmHg Patient Gender: M              HR:           79 bpm. Exam Location:  [facility] Procedure: 2D Echo, Color Doppler and Cardiac Doppler Indications:     I50.21 congestive heart failure-Acute Systolic  History:         Patient has prior history of Echocardiogram examinations. CHF,                  COPD; Risk Factors:Hypertension.  Sonographer:     Charmayne Sheer Referring Phys:  [id number] Flora Lipps Diagnosing Phys: Serafina Royals MD  Sonographer Comments: Suboptimal apical window and no subcostal window. Image acquisition challenging due to COPD. IMPRESSIONS  1. Left ventricular ejection fraction, by estimation, is 55 to 60%. The left ventricle has normal function. The left ventricle has no regional wall motion abnormalities. Left ventricular diastolic parameters were normal.  2. Right ventricular systolic function is mildly reduced. The right ventricular size is severely enlarged. There is moderately elevated pulmonary artery systolic pressure.  3. Right atrial size was moderately dilated.  4. The mitral valve is normal in structure. Trivial mitral valve regurgitation.  5. Tricuspid valve regurgitation is mild to moderate.  6. The aortic valve is normal in structure. Aortic valve regurgitation is not visualized. FINDINGS  Left Ventricle: Left ventricular ejection fraction, by estimation, is 55 to 60%. The left ventricle has normal function. The left ventricle has no regional wall motion abnormalities. The left ventricular internal cavity size was small. There is no left ventricular hypertrophy. Left ventricular diastolic  parameters were normal. Right Ventricle: The right ventricular size is severely enlarged. No increase in right ventricular wall thickness. Right ventricular systolic function is mildly reduced. There is moderately elevated pulmonary artery systolic pressure. Left Atrium: Left atrial size was normal in size. Right Atrium: Right atrial size was moderately dilated. Pericardium: There is no evidence of pericardial effusion. Mitral Valve: The mitral valve is normal in structure. Trivial mitral valve regurgitation. MV peak gradient, 2.4 mmHg. The mean mitral valve gradient is 1.0 mmHg. Tricuspid Valve: The tricuspid valve is normal in structure. Tricuspid valve regurgitation is mild to moderate. Aortic Valve: The aortic valve is normal in structure. Aortic valve regurgitation is not visualized. Aortic valve mean gradient measures 4.0 mmHg. Aortic valve peak gradient measures 7.5 mmHg. Aortic valve area, by VTI measures 3.18 cm. Pulmonic Valve: The pulmonic valve was normal in structure. Pulmonic valve regurgitation is mild. Aorta: The aortic root and ascending aorta are structurally normal, with no evidence of dilitation. IAS/Shunts: No atrial level shunt detected by color flow Doppler.  LEFT VENTRICLE PLAX 2D LVIDd:         4.11 cm   Diastology LVIDs:         2.95 cm   LV e' medial:    4.79 cm/s LV PW:         1.19 cm   LV E/e' medial:  11.2 LV IVS:        1.07 cm   LV e' lateral:   11.50 cm/s LVOT diam:     2.20 cm   LV E/e' lateral: 4.7 LV SV:         62 LV SV Index:   27 LVOT Area:     3.80 cm  RIGHT VENTRICLE RV Basal diam:  4.80 cm RV Mid diam:    5.53 cm RV S prime:     13.50 cm/s LEFT ATRIUM  Index        RIGHT ATRIUM           Index LA diam:      3.50 cm 1.54 cm/m   RA Area:     28.00 cm LA Vol (A4C): 34.4 ml 15.16 ml/m  RA Volume:   109.00 ml 48.04 ml/m  AORTIC VALVE                    PULMONIC VALVE AV Area (Vmax):    3.00 cm     PV Vmax:          1.06 m/s AV Area (Vmean):   2.96 cm     PV  Vmean:         72.500 cm/s AV Area (VTI):     3.18 cm     PV VTI:           0.178 m AV Vmax:           137.00 cm/s  PV Peak grad:     4.5 mmHg AV Vmean:          89.500 cm/s  PV Mean grad:     2.0 mmHg AV VTI:            0.195 m      PR End Diast Vel: 3.72 msec AV Peak Grad:      7.5 mmHg AV Mean Grad:      4.0 mmHg LVOT Vmax:         108.00 cm/s LVOT Vmean:        69.800 cm/s LVOT VTI:          0.163 m LVOT/AV VTI ratio: 0.84  AORTA Ao Root diam: 4.00 cm MITRAL VALVE               TRICUSPID VALVE MV Area (PHT): 3.12 cm    TR Peak grad:   39.9 mmHg MV Area VTI:   2.94 cm    TR Vmax:        316.00 cm/s MV Peak grad:  2.4 mmHg MV Mean grad:  1.0 mmHg    SHUNTS MV Vmax:       0.77 m/s    Systemic VTI:  0.16 m MV Vmean:      56.0 cm/s   Systemic Diam: 2.20 cm MV Decel Time: 243 msec MV E velocity: 53.60 cm/s MV A velocity: 82.30 cm/s MV E/A ratio:  0.65 Serafina Royals MD Electronically signed by Serafina Royals MD Signature Date/Time: 01/29/2022/12:40:03 PM    Final    CT CHEST ABDOMEN PELVIS WO CONTRAST  Result Date: 01/21/2022 CLINICAL DATA:  Thrombocytopenia EXAM: CT CHEST, ABDOMEN AND PELVIS WITHOUT CONTRAST TECHNIQUE: Multidetector CT imaging of the chest, abdomen and pelvis was performed following the standard protocol without IV contrast. RADIATION DOSE REDUCTION: This exam was performed according to the departmental dose-optimization program which includes automated exposure control, adjustment of the mA and/or kV according to patient size and/or use of iterative reconstruction technique. COMPARISON:  None. FINDINGS: CT CHEST FINDINGS Cardiovascular: Extensive multi-vessel coronary artery calcification. Cardiac size is within normal limits. No pericardial effusion. The central pulmonary arteries are enlarged in keeping with changes of pulmonary arterial hypertension. Moderate atherosclerotic calcification within the thoracic aorta. The ascending aorta is dilated, measuring 5.1 cm in diameter, stable since  prior examination. Descending thoracic aorta is of normal caliber. Mediastinum/Nodes: Visualized thyroid is unremarkable. No pathologic thoracic adenopathy. Esophagus unremarkable. Lungs/Pleura: Moderate emphysema. Mild bronchial wall thickening in  keeping with airway inflammation. No focal pulmonary nodules or infiltrates identified. No pneumothorax or pleural effusion. No central obstructing lesion. Musculoskeletal: No acute bone abnormality. No lytic or blastic bone lesion. CT ABDOMEN PELVIS FINDINGS Hepatobiliary: No focal liver abnormality is seen. No gallstones, gallbladder wall thickening, or biliary dilatation. Pancreas: Unremarkable Spleen: Normal in size without focal abnormality. Adrenals/Urinary Tract: The adrenal glands are unremarkable. The kidneys are normal on this noncontrast examination. The bladder is circumferentially thick walled and there is extensive perivesicular inflammatory stranding suggesting changes of a diffuse infectious or inflammatory cystitis. The bladder is not distended. Stomach/Bowel: Moderate descending and sigmoid colonic diverticulosis without superimposed acute inflammatory change. The stomach, small bowel, and large bowel are otherwise unremarkable. Appendix normal. No free intraperitoneal gas. Trace ascites. Vascular/Lymphatic: 3.3 x 3.1 cm infrarenal abdominal aortic aneurysm is present. Superimposed moderate aortoiliac atherosclerotic calcification. No pathologic adenopathy within the abdomen and pelvis. Reproductive: Marked prostatic enlargement is present within estimated prostatic volume of 123 cc. There is mild periprostatic inflammatory stranding also identified. Other: Mild diffuse body wall subcutaneous edema. Mild diffuse retroperitoneal edema. No abdominal wall hernia. Musculoskeletal: No acute bone abnormality. Degenerative changes are seen within the lumbar spine. No lytic or blastic bone lesion. IMPRESSION: No acute intrathoracic or intra-abdominal pathology  identified. Extensive multi-vessel coronary artery calcification. Morphologic changes in keeping with pulmonary arterial hypertension. Ascending thoracic aortic aneurysm with maximal transaxial dimension of 5.1 cm, stable since prior examination of 09/01/2019. Ascending thoracic aortic aneurysm. Recommend semi-annual imaging followup by CTA or MRA and referral to cardiothoracic surgery if not already obtained. This recommendation follows 2010 ACCF/AHA/AATS/ACR/ASA/SCA/SCAI/SIR/STS/SVM Guidelines for the Diagnosis and Management of Patients With Thoracic Aortic Disease. Circulation. 2010; 121: F810-F751. Aortic aneurysm NOS (ICD10-I71.9) Moderate emphysema. Marked prostatic enlargement. Superimposed periprostatic and perivesicular inflammatory stranding suggesting changes of infectious or inflammatory cysto prostatitis. No evidence of secondary urinary retention. Moderate distal colonic diverticulosis without superimposed acute inflammatory change. 3.3 cm infrarenal abdominal aortic aneurysm. Recommend follow-up every 3 years. Reference: J Am Coll Radiol 0258;52:778-242. Mild anasarca with mild diffuse body wall subcutaneous edema, trace ascites, and retroperitoneal edema. Electronically Signed   By: Fidela Salisbury M.D.   On: 01/21/2022 01:29    ECHO 01/29/2022  1. Left ventricular ejection fraction, by estimation, is 55 to 60%. The  left ventricle has normal function. The left ventricle has no regional  wall motion abnormalities. Left ventricular diastolic parameters were  normal.   2. Right ventricular systolic function is mildly reduced. The right  ventricular size is severely enlarged. There is moderately elevated  pulmonary artery systolic pressure.   3. Right atrial size was moderately dilated.   4. The mitral valve is normal in structure. Trivial mitral valve  regurgitation.   5. Tricuspid valve regurgitation is mild to moderate.   6. The aortic valve is normal in structure. Aortic valve  regurgitation is  not visualized.   TELEMETRY reviewed by me:   EKG reviewed by me: NSR rate 82, RAE, PVC similar in appearance to prior  ASSESSMENT AND PLAN:  The patient is a 76 year old male with a past medical history significant for HFpEF (LVEF 55-60% with severe RV enlargement and moderate RA dilation 01/29/22), hypertension, hyperlipidemia, CAD by CT scan, CKD 3, COPD, primary erythrocytosis who presented to same-day surgery for IV Lasix but was found to be hypotensive and hypoxic initially requiring supplemental oxygen by [place] and eventually placed on BiPAP. He was subsequently brought to [facility] ED 01/28/2022.  Cardiology is consulted for assistance with diuresis.  #  Acute on chronic HFpEF (LVEF 55-60% with severe RV enlargement mod RA dilation) #Cardiogenic shock #COPD #pulmonary hypertension The patient was instructed by nephrology on 2/2 to have IV Lasix 80 mg treatments 3 times weekly for 2 weeks due to an increase in his weight.  He presented to same-day surgery for this treatment on 2/13 and was found to be hypotensive and hypoxic, requiring supplemental oxygen.  He was admitted to the ICU and started on Lasix gtt. and dopamine for blood pressure support, on midodrine alone 2/15.  His weight on admission is 243 pounds, up from his baseline from January 2023 of 228 reportedly. -Agree with Lasix gtt and albumin, appreciate nephrology assistance. -dopamine weaned off yesterday morning, now on midodrine TID with good BP support -Echocardiogram resulted with improved EF but with severe RV enlargement, moderate RA dilation and mod elevated pulmonary artery pressure  -recommend holding home losartan 100 mg and torsemide 100mg  for now -recommend strict I&Os, daily weights  #AKI on CKD 3 Baseline from 12/2021 at 1.87, 37. Cr downtrending with diuresis 3.07-2.7-2.17 and GFR 26 today. -lasix gtt and albumin as above.  -appears somewhat improved in his lower extremities but still grossly volume  overloaded.   This patient's plan of care was discussed and created with Dr. Nehemiah Massed and he is in agreement.  Signed: Tristan Schroeder , PA-C 01/30/2022, 10:27 AM [facility]

## 2022-01-30 NOTE — Progress Notes (Signed)
PROGRESS NOTE    [name]  AST:[id number] DOB: 05-22-1946 DOA: 01/28/2022 PCP: Tracie Harrier, MD  [id number]/[id number]   Assessment & Plan:   Principal Problem:   Acute respiratory failure with hypoxia (John [name]) Active Problems:   Cardiorenal syndrome, stage 1-4 or unspecified chronic kidney disease, with heart failure (HCC)   Acute left systolic heart failure (HCC)   Cor pulmonale, acute (HCC)   Renal failure syndrome   CKD (chronic kidney disease) stage 4, GFR 15-29 ml/min (HCC)   COPD exacerbation (HCC)   Cardiogenic shock (HCC)   John [name] is a 76yo AAM with hx of CHF, CAD, COPD who presented with acute hypoxemic respiratory failure w/generalized swelling and anasarca w/hypotension. Patient presented to outpatient clinic on 2/13 for scheduled lasix therapy but was unable to receive due to hypotension w/systolics in the 98X. Patient then presented to the ED and was admitted by PCCM to start Dopamine infusion to improve blood pressure and started on Lasix gtt. The patient was placed on BIPAP to improve his oxygen saturation.  Weaned off Dopa gtt and BiPAP on 2/14 and transferred to hospitalist service on 2/15.   Acute hypoxemic respiratory failure --being treated for CHF exacerbation --currently on 4L --Continue supplemental O2 to keep sats between 88-92%, wean as tolerated  Acute on Chronic systolic and diastolic heart failure --Last ECHO 2021: EF 45-50%, no wall motion abnormalities --current Echo with improved LVEF to 55-60%, however, with moderately elevated  pulmonary artery systolic pressure.  --cardiology following Plan: --cont lasix gtt --cont midodrine and IV albumin support --GDMT held due to hypotension   COPD  --unclear if having acute exacerbation.  No significant wheezing.  Pt denied coughing.  Pt was not started on steroid or scheduled nebs. --monitor for now   AKI On CKD 3b baseline creatinine 1.76/GFR 40 from November 19, 2021 --nephro  consulted Plan: --cont lasix gtt --avoid hypotension --cont midodrine, low dose iv albumin supplementation   DVT prophylaxis: Heparin SQ Code Status: Full code  Family Communication:  Level of care: Progressive Dispo:   The patient is from: home Anticipated d/c is to: home Anticipated d/c date is: 2-3 days Patient currently is not medically ready to d/c due to: IV lasix gtt   Subjective and Interval History:  Pt denied cough.     Objective: Vitals:   01/30/22 0700 01/30/22 0730 01/30/22 0800 01/30/22 0900  BP: 130/87  111/73 125/71  Pulse:   85 88  Resp: (!) 22  (!) 25 (!) 21  Temp:  100.1 F (37.8 C)    TempSrc:  Oral    SpO2:   99% 97%  Weight:      Height:        Intake/Output Summary (Last 24 hours) at 01/30/2022 1407 Last data filed at 01/30/2022 0800 Gross per 24 hour  Intake 1091.59 ml  Output 1250 ml  Net -158.41 ml   Filed Weights   01/28/22 2030 01/29/22 0500 01/30/22 0500  Weight: 111 kg 110.4 kg 110.5 kg    Examination:   Constitutional: NAD, lethargic but followed commands and answered questions HEENT: conjunctivae and lids normal, EOMI CV: No cyanosis.   RESP: normal respiratory effort, mild crackles, no wheezes, on 4L  Extremities: tense edema in BLE SKIN: warm, dry   Data Reviewed: I have personally reviewed following labs and imaging studies  CBC: Recent Labs  Lab 01/28/22 1603 01/28/22 2200 01/29/22 0404  WBC 11.5* 10.2 9.2  NEUTROABS 9.4*  --   --   HGB  17.0 17.5* 16.3  HCT 54.4* 56.7* 54.6*  MCV 102.1* 104.4* 105.6*  PLT 141* 144* 502*   Basic Metabolic Panel: Recent Labs  Lab 01/25/22 0820 01/28/22 1603 01/28/22 2200 01/29/22 0404 01/29/22 1812 01/30/22 0232 01/30/22 0451 01/30/22 1109  NA 141   < >  --  140 142 143 143 142  K 4.0   < >  --  4.1 4.6 4.3 4.4 3.8  CL 99   < >  --  99 100 101 99 97*  CO2 33*   < >  --  34* 34* 35* 37* 38*  GLUCOSE 121*   < >  --  126* 115* 111* 122* 97  BUN 40*   < >  --  60* 55*  47* 44* 42*  CREATININE 2.05*   < >  --  3.09* 2.70* 2.47* 2.47* 2.26*  CALCIUM 9.1   < >  --  8.7* 9.0 8.9 9.1 9.1  MG  --   --  2.5* 2.4  --   --  2.4  --   PHOS 4.0  --   --  5.6*  --   --   --   --    < > = values in this interval not displayed.   GFR: Estimated Creatinine Clearance: 34.6 mL/min (A) (by C-G formula based on SCr of 2.26 mg/dL (H)). Liver Function Tests: Recent Labs  Lab 01/25/22 0820 01/28/22 1603 01/29/22 0404  AST  --  25 23  ALT  --  21 21  ALKPHOS  --  72 63  BILITOT  --  2.1* 1.4*  PROT  --  5.6* 5.4*  ALBUMIN 3.1* 2.8* 2.8*   No results for input(s): LIPASE, AMYLASE in the last 168 hours. No results for input(s): AMMONIA in the last 168 hours. Coagulation Profile: No results for input(s): INR, PROTIME in the last 168 hours. Cardiac Enzymes: No results for input(s): CKTOTAL, CKMB, CKMBINDEX, TROPONINI in the last 168 hours. BNP (last 3 results) No results for input(s): PROBNP in the last 8760 hours. HbA1C: No results for input(s): HGBA1C in the last 72 hours. CBG: Recent Labs  Lab 01/28/22 2038  GLUCAP 92   Lipid Profile: No results for input(s): CHOL, HDL, LDLCALC, TRIG, CHOLHDL, LDLDIRECT in the last 72 hours. Thyroid Function Tests: No results for input(s): TSH, T4TOTAL, FREET4, T3FREE, THYROIDAB in the last 72 hours. Anemia Panel: No results for input(s): VITAMINB12, FOLATE, FERRITIN, TIBC, IRON, RETICCTPCT in the last 72 hours. Sepsis Labs: Recent Labs  Lab 01/28/22 2200 01/29/22 0005  LATICACIDVEN 1.2 1.7    Recent Results (from the past 240 hour(s))  Resp Panel by RT-PCR (Flu A&B, Covid) Nasopharyngeal Swab     Status: None   Collection Time: 01/28/22  7:08 PM   Specimen: Nasopharyngeal Swab; Nasopharyngeal(NP) swabs in vial transport medium  Result Value Ref Range Status   SARS Coronavirus 2 by RT PCR NEGATIVE NEGATIVE Final    Comment: (NOTE) SARS-CoV-2 target nucleic acids are NOT DETECTED.  The SARS-CoV-2 RNA is  generally detectable in upper respiratory specimens during the acute phase of infection. The lowest concentration of SARS-CoV-2 viral copies this assay can detect is 138 copies/mL. A negative result does not preclude SARS-Cov-2 infection and should not be used as the sole basis for treatment or other patient management decisions. A negative result may occur with  improper specimen collection/handling, submission of specimen other than nasopharyngeal swab, presence of viral mutation(s) within the areas targeted by this assay,  and inadequate number of viral copies(<138 copies/mL). A negative result must be combined with clinical observations, patient history, and epidemiological information. The expected result is Negative.  Fact Sheet for Patients:  EntrepreneurPulse.com.au  Fact Sheet for Healthcare Providers:  IncredibleEmployment.be  This test is no t yet approved or cleared by the [place] FDA and  has been authorized for detection and/or diagnosis of SARS-CoV-2 by FDA under an Emergency Use Authorization (EUA). This EUA will remain  in effect (meaning this test can be used) for the duration of the COVID-19 declaration under Section 564(b)(1) of the Act, 21 U.S.C.section 360bbb-3(b)(1), unless the authorization is terminated  or revoked sooner.       Influenza A by PCR NEGATIVE NEGATIVE Final   Influenza B by PCR NEGATIVE NEGATIVE Final    Comment: (NOTE) The Xpert Xpress SARS-CoV-2/FLU/RSV plus assay is intended as an aid in the diagnosis of influenza from Nasopharyngeal swab specimens and should not be used as a sole basis for treatment. Nasal washings and aspirates are unacceptable for Xpert Xpress SARS-CoV-2/FLU/RSV testing.  Fact Sheet for Patients: EntrepreneurPulse.com.au  Fact Sheet for Healthcare Providers: IncredibleEmployment.be  This test is not yet approved or cleared by the [place] FDA and has been authorized for detection and/or diagnosis of SARS-CoV-2 by FDA under an Emergency Use Authorization (EUA). This EUA will remain in effect (meaning this test can be used) for the duration of the COVID-19 declaration under Section 564(b)(1) of the Act, 21 U.S.C. section 360bbb-3(b)(1), unless the authorization is terminated or revoked.  Performed at [facility], [address] [place], [place] [address]   MRSA Next Gen by PCR, Nasal     Status: None   Collection Time: 01/28/22  8:47 PM   Specimen: Nasal Mucosa; Nasal Swab  Result Value Ref Range Status   MRSA by PCR Next Gen NOT DETECTED NOT DETECTED Final    Comment: (NOTE) The GeneXpert MRSA Assay (FDA approved for NASAL specimens only), is one component of a comprehensive MRSA colonization surveillance program. It is not intended to diagnose MRSA infection nor to guide or monitor treatment for MRSA infections. Test performance is not FDA approved in patients less than 44 years old. Performed at [facility], [address] [place], [place] [address]       Radiology Studies: DG Chest Portable 1 View  Result Date: 01/28/2022 CLINICAL DATA:  Low oxygen saturation EXAM: PORTABLE CHEST 1 VIEW COMPARISON:  06/21/2020, CT 01/18/2022 FINDINGS: Cardiomegaly with central congestion. Streaky atelectasis or scarring at the bases. No pleural effusion or pneumothorax. Emphysematous disease. IMPRESSION: 1. Cardiomegaly with central congestion. 2. Hazy atelectasis or scarring right base 3. Emphysema Electronically Signed   By: Donavan Foil M.D.   On: 01/28/2022 17:20   ECHOCARDIOGRAM COMPLETE  Result Date: 01/29/2022    ECHOCARDIOGRAM REPORT   Patient Name:   John [name] Date of Exam: 01/29/2022 Medical Rec #:  [id number]      Height:       70.0 in Accession #:    [id number]     Weight:       243.4 lb Date of Birth:  1946-08-19     BSA:          2.269 m Patient Age:    76 years       BP:            88/55 mmHg Patient Gender: M              HR:  79 bpm. Exam Location:  [facility] Procedure: 2D Echo, Color Doppler and Cardiac Doppler Indications:     I50.21 congestive heart failure-Acute Systolic  History:         Patient has prior history of Echocardiogram examinations. CHF,                  COPD; Risk Factors:Hypertension.  Sonographer:     Charmayne Sheer Referring Phys:  [id number] Flora Lipps Diagnosing Phys: Serafina Royals MD  Sonographer Comments: Suboptimal apical window and no subcostal window. Image acquisition challenging due to COPD. IMPRESSIONS  1. Left ventricular ejection fraction, by estimation, is 55 to 60%. The left ventricle has normal function. The left ventricle has no regional wall motion abnormalities. Left ventricular diastolic parameters were normal.  2. Right ventricular systolic function is mildly reduced. The right ventricular size is severely enlarged. There is moderately elevated pulmonary artery systolic pressure.  3. Right atrial size was moderately dilated.  4. The mitral valve is normal in structure. Trivial mitral valve regurgitation.  5. Tricuspid valve regurgitation is mild to moderate.  6. The aortic valve is normal in structure. Aortic valve regurgitation is not visualized. FINDINGS  Left Ventricle: Left ventricular ejection fraction, by estimation, is 55 to 60%. The left ventricle has normal function. The left ventricle has no regional wall motion abnormalities. The left ventricular internal cavity size was small. There is no left ventricular hypertrophy. Left ventricular diastolic parameters were normal. Right Ventricle: The right ventricular size is severely enlarged. No increase in right ventricular wall thickness. Right ventricular systolic function is mildly reduced. There is moderately elevated pulmonary artery systolic pressure. Left Atrium: Left atrial size was normal in size. Right Atrium: Right atrial size was moderately dilated. Pericardium: There is no evidence of  pericardial effusion. Mitral Valve: The mitral valve is normal in structure. Trivial mitral valve regurgitation. MV peak gradient, 2.4 mmHg. The mean mitral valve gradient is 1.0 mmHg. Tricuspid Valve: The tricuspid valve is normal in structure. Tricuspid valve regurgitation is mild to moderate. Aortic Valve: The aortic valve is normal in structure. Aortic valve regurgitation is not visualized. Aortic valve mean gradient measures 4.0 mmHg. Aortic valve peak gradient measures 7.5 mmHg. Aortic valve area, by VTI measures 3.18 cm. Pulmonic Valve: The pulmonic valve was normal in structure. Pulmonic valve regurgitation is mild. Aorta: The aortic root and ascending aorta are structurally normal, with no evidence of dilitation. IAS/Shunts: No atrial level shunt detected by color flow Doppler.  LEFT VENTRICLE PLAX 2D LVIDd:         4.11 cm   Diastology LVIDs:         2.95 cm   LV e' medial:    4.79 cm/s LV PW:         1.19 cm   LV E/e' medial:  11.2 LV IVS:        1.07 cm   LV e' lateral:   11.50 cm/s LVOT diam:     2.20 cm   LV E/e' lateral: 4.7 LV SV:         62 LV SV Index:   27 LVOT Area:     3.80 cm  RIGHT VENTRICLE RV Basal diam:  4.80 cm RV Mid diam:    5.53 cm RV S prime:     13.50 cm/s LEFT ATRIUM           Index        RIGHT ATRIUM  Index LA diam:      3.50 cm 1.54 cm/m   RA Area:     28.00 cm LA Vol (A4C): 34.4 ml 15.16 ml/m  RA Volume:   109.00 ml 48.04 ml/m  AORTIC VALVE                    PULMONIC VALVE AV Area (Vmax):    3.00 cm     PV Vmax:          1.06 m/s AV Area (Vmean):   2.96 cm     PV Vmean:         72.500 cm/s AV Area (VTI):     3.18 cm     PV VTI:           0.178 m AV Vmax:           137.00 cm/s  PV Peak grad:     4.5 mmHg AV Vmean:          89.500 cm/s  PV Mean grad:     2.0 mmHg AV VTI:            0.195 m      PR End Diast Vel: 3.72 msec AV Peak Grad:      7.5 mmHg AV Mean Grad:      4.0 mmHg LVOT Vmax:         108.00 cm/s LVOT Vmean:        69.800 cm/s LVOT VTI:          0.163  m LVOT/AV VTI ratio: 0.84  AORTA Ao Root diam: 4.00 cm MITRAL VALVE               TRICUSPID VALVE MV Area (PHT): 3.12 cm    TR Peak grad:   39.9 mmHg MV Area VTI:   2.94 cm    TR Vmax:        316.00 cm/s MV Peak grad:  2.4 mmHg MV Mean grad:  1.0 mmHg    SHUNTS MV Vmax:       0.77 m/s    Systemic VTI:  0.16 m MV Vmean:      56.0 cm/s   Systemic Diam: 2.20 cm MV Decel Time: 243 msec MV E velocity: 53.60 cm/s MV A velocity: 82.30 cm/s MV E/A ratio:  0.65 Serafina Royals MD Electronically signed by Serafina Royals MD Signature Date/Time: 01/29/2022/12:40:03 PM    Final      Scheduled Meds:  Chlorhexidine Gluconate Cloth  6 each Topical Q0600   heparin  5,000 Units Subcutaneous Q8H   mouth rinse  15 mL Mouth Rinse BID   midodrine  5 mg Oral TID WC   sodium chloride flush  3 mL Intravenous Q12H   Continuous Infusions:  sodium chloride     albumin human 12.5 g (01/30/22 1020)   furosemide (LASIX) 200 mg in dextrose 5% 100 mL (2mg /mL) infusion 8 mg/hr (01/29/22 2134)     LOS: 2 days     Enzo Bi, MD [facility] If 7PM-7AM, please contact night-coverage 01/30/2022, 2:07 PM

## 2022-01-30 NOTE — Evaluation (Signed)
Physical Therapy Evaluation Patient Details Name: John [name] MRN: [id number] DOB: April 10, 1946 Today's Date: 01/30/2022  History of Present Illness  76 yo M with acute resp distress with hypoxia with generalized swelling and anasarca with low BP.  Acute CHF exacerbation with  progressive renal failure with generalized anasarca with progressive cardio-renal syndrome with COPD exacerbation cor pulmonale and pulmonary HTN  Clinical Impression  Pt initially lethargic and struggled with consistent interaction with PT for history gathering etc, however when it was time to do some mobility he did wake and show more awareness.  Pt normally very active and does not require AD, however today he was clearly reliant on the RW and even so had some stagger stepping and unsteadiness, though he ultimately was able to ambulate ~100 ft.  Pt on 3L O2 on arrival, sats stayed in the high 90s on room air during bed exercises and light mobility; O2 also stayed steady in the mid/upper 90s t/o bulk of ambulation on room air, though as he fatigued he did ultimately drop to low 80s; quickly back to 90s when returned to 3L.       Recommendations for follow up therapy are one component of a multi-disciplinary discharge planning process, led by the attending physician.  Recommendations may be updated based on patient status, additional functional criteria and insurance authorization.  Follow Up Recommendations Home health PT    Assistance Recommended at Discharge Intermittent Supervision/Assistance  Patient can return home with the following  A little help with walking and/or transfers;Assist for transportation;Assistance with cooking/housework    Equipment Recommendations Rolling walker (2 wheels) (they are checking to see if they still have a walker)  Recommendations for Other Services       Functional Status Assessment Patient has had a recent decline in their functional status and demonstrates the ability to make  significant improvements in function in a reasonable and predictable amount of time.     Precautions / Restrictions Precautions Precautions: Fall Restrictions Weight Bearing Restrictions: No      Mobility  Bed Mobility Overal bed mobility: Modified Independent             General bed mobility comments: able to get himself to sitting EOB w/o direct assist, UEs on bed rail    Transfers Overall transfer level: Modified independent Equipment used: Rolling walker (2 wheels)               General transfer comment: multiple sit to stand transfers with heavy forward lean and UE use but able to attain standing w/o physical assist    Ambulation/Gait Ambulation/Gait assistance: Min assist Gait Distance (Feet): 100 Feet Assistive device: Rolling walker (2 wheels)         General Gait Details: Pt motivated to try a long walk.  He did have some issues with appropriate use of walker (getting outside BOS, forward lean) and showed some impulsivity but ultimately he managed a decent distance bout of ambulation.  On room air with the effort, maintained mid 90s nearly the entire time but started to fatigue with drop in O2 quickly for the last 25 ft (down to low 80s when pulse ox was actually reading)  Stairs            Wheelchair Mobility    Modified Rankin (Stroke Patients Only)       Balance Overall balance assessment: Modified Independent  Pertinent Vitals/Pain Pain Assessment Pain Assessment: No/denies pain    Home Living Family/patient expects to be discharged to:: Private residence Living Arrangements: Non-relatives/Friends Available Help at Discharge: Friend(s)   Home Access: Stairs to enter Entrance Stairs-Rails:  (yes) Entrance Stairs-Number of Steps: 3     Home Equipment: Conservation officer, nature (2 wheels) (friend will check)      Prior Function Prior Level of Function : Independent/Modified  Independent             Mobility Comments: apparently he was playing tennis multiple times/wk, able to drive, run errands, etc       Hand Dominance        Extremity/Trunk Assessment   Upper Extremity Assessment Upper Extremity Assessment: Generalized weakness (grossly 4-/5 t/o)    Lower Extremity Assessment Lower Extremity Assessment: Generalized weakness (grossly 4/5 t/o)       Communication   Communication: No difficulties  Cognition Arousal/Alertness: Awake/alert Behavior During Therapy: Restless Overall Cognitive Status: Within Functional Limits for tasks assessed                                 General Comments: pt initially very lethargic, but did increase appropriate interaction with increased time        General Comments      Exercises     Assessment/Plan    PT Assessment Patient needs continued PT services  PT Problem List Decreased strength;Decreased range of motion;Decreased activity tolerance;Decreased balance;Decreased mobility;Decreased cognition;Decreased knowledge of use of DME;Decreased safety awareness;Cardiopulmonary status limiting activity       PT Treatment Interventions DME instruction;Stair training;Gait training;Functional mobility training;Therapeutic activities;Therapeutic exercise;Balance training;Patient/family education    PT Goals (Current goals can be found in the Care Plan section)  Acute Rehab PT Goals Patient Stated Goal: go home PT Goal Formulation: With patient Time For Goal Achievement: 02/13/22 Potential to Achieve Goals: Good    Frequency Min 2X/week     Co-evaluation               AM-PAC PT "6 Clicks" Mobility  Outcome Measure Help needed turning from your back to your side while in a flat bed without using bedrails?: None Help needed moving from lying on your back to sitting on the side of a flat bed without using bedrails?: None Help needed moving to and from a bed to a chair (including  a wheelchair)?: None Help needed standing up from a chair using your arms (e.g., wheelchair or bedside chair)?: A Little Help needed to walk in hospital room?: A Little Help needed climbing 3-5 steps with a railing? : A Little 6 Click Score: 21    End of Session Equipment Utilized During Treatment: Gait belt;Oxygen Activity Tolerance: Patient tolerated treatment well;Patient limited by fatigue Patient left: in chair;with call bell/phone within reach;with nursing/sitter in room;with family/visitor present Nurse Communication: Mobility status PT Visit Diagnosis: Muscle weakness (generalized) (M62.81);Difficulty in walking, not elsewhere classified (R26.2);Unsteadiness on feet (R26.81)    Time: 1610-9604 PT Time Calculation (min) (ACUTE ONLY): 30 min   Charges:   PT Evaluation $PT Eval Low Complexity: 1 Low PT Treatments $Gait Training: 8-22 mins        Kreg Shropshire, DPT 01/30/2022, 1:55 PM

## 2022-01-31 DIAGNOSIS — J9601 Acute respiratory failure with hypoxia: Secondary | ICD-10-CM | POA: Diagnosis not present

## 2022-01-31 LAB — CBC
HCT: 57.1 % — ABNORMAL HIGH (ref 39.0–52.0)
Hemoglobin: 16.8 g/dL (ref 13.0–17.0)
MCH: 30.8 pg (ref 26.0–34.0)
MCHC: 29.4 g/dL — ABNORMAL LOW (ref 30.0–36.0)
MCV: 104.8 fL — ABNORMAL HIGH (ref 80.0–100.0)
Platelets: 108 10*3/uL — ABNORMAL LOW (ref 150–400)
RBC: 5.45 MIL/uL (ref 4.22–5.81)
RDW: 13.6 % (ref 11.5–15.5)
WBC: 10.4 10*3/uL (ref 4.0–10.5)
nRBC: 0 % (ref 0.0–0.2)

## 2022-01-31 LAB — BASIC METABOLIC PANEL
Anion gap: 5 (ref 5–15)
BUN: 32 mg/dL — ABNORMAL HIGH (ref 8–23)
CO2: 43 mmol/L — ABNORMAL HIGH (ref 22–32)
Calcium: 9.1 mg/dL (ref 8.9–10.3)
Chloride: 97 mmol/L — ABNORMAL LOW (ref 98–111)
Creatinine, Ser: 1.87 mg/dL — ABNORMAL HIGH (ref 0.61–1.24)
GFR, Estimated: 37 mL/min — ABNORMAL LOW (ref >60)
Glucose, Bld: 104 mg/dL — ABNORMAL HIGH (ref 70–99)
Potassium: 3.8 mmol/L (ref 3.5–5.1)
Sodium: 145 mmol/L (ref 135–145)

## 2022-01-31 LAB — MAGNESIUM: Magnesium: 2.1 mg/dL (ref 1.7–2.4)

## 2022-01-31 NOTE — Progress Notes (Signed)
[place], [place] 01/31/22  Subjective:   Hospital day # 3  Patient known to our practice from outpatient follow-up.  He follows up for chronic kidney disease stage III.  His usual creatinine fluctuates between 1.5-1.7. He was scheduled to get IV furosemide in same-day surgery but could not receive it because Blood pressure was in the 70s.    Update Patient seen sitting at side of bed, nurse at bedside administering morning Patient states appetite remains poor Denies shortness of breath, however nursing states he gets short of breath with minimal exertion.  Remains on 4 L [place] Lower extremity edema remains  Creatinine slightly improved Recorded urine output of 2.4 L in preceding 24 hours  Renal: 02/15 0701 - 02/16 0700 In: 675.9 [P.O.:480; I.V.:122.5; IV Piggyback:73.4] Out: 2425 [Urine:2425] Lab Results  Component Value Date   CREATININE 1.87 (H) 01/31/2022   CREATININE 2.26 (H) 01/30/2022   CREATININE 2.47 (H) 01/30/2022     Objective:  Vital signs in last 24 hours:  Temp:  [97.5 F (36.4 C)-98.3 F (36.8 C)] 98 F (36.7 C) (02/16 1139) Pulse Rate:  [81-92] 81 (02/16 1139) Resp:  [18-24] 18 (02/16 1139) BP: (115-135)/(78-91) 133/82 (02/16 1139) SpO2:  [91 %-99 %] 99 % (02/16 1139) Weight:  [107 kg-108.8 kg] 107 kg (02/16 0425)  Weight change: -1.682 kg Filed Weights   01/30/22 0500 01/30/22 1637 01/31/22 0425  Weight: 110.5 kg 108.8 kg 107 kg    Intake/Output:    Intake/Output Summary (Last 24 hours) at 01/31/2022 1244 Last data filed at 01/31/2022 1139 Gross per 24 hour  Intake 435.9 ml  Output 3025 ml  Net -2589.1 ml      Physical Exam: General: No acute distress, sitting at bedside  HEENT Moist oral mucous membranes  Pulm/lungs Bilateral pulmonary crackles  CVS/Heart regular  Abdomen:  Soft, nontender  Extremities: Massive lower extremity edema bilaterally  Neurologic: Alert, able to answer few simple questions   Skin: Warm, dry          Basic Metabolic Panel:  Recent Labs  Lab 01/25/22 0820 01/28/22 1603 01/28/22 2200 01/29/22 0404 01/29/22 1812 01/30/22 0232 01/30/22 0451 01/30/22 1109 01/31/22 0422  NA 141   < >  --  140 142 143 143 142 145  K 4.0   < >  --  4.1 4.6 4.3 4.4 3.8 3.8  CL 99   < >  --  99 100 101 99 97* 97*  CO2 33*   < >  --  34* 34* 35* 37* 38* 43*  GLUCOSE 121*   < >  --  126* 115* 111* 122* 97 104*  BUN 40*   < >  --  60* 55* 47* 44* 42* 32*  CREATININE 2.05*   < >  --  3.09* 2.70* 2.47* 2.47* 2.26* 1.87*  CALCIUM 9.1   < >  --  8.7* 9.0 8.9 9.1 9.1 9.1  MG  --   --  2.5* 2.4  --   --  2.4  --  2.1  PHOS 4.0  --   --  5.6*  --   --   --   --   --    < > = values in this interval not displayed.      CBC: Recent Labs  Lab 01/28/22 1603 01/28/22 2200 01/29/22 0404 01/31/22 0422  WBC 11.5* 10.2 9.2 10.4  NEUTROABS 9.4*  --   --   --   HGB 17.0  17.5* 16.3 16.8  HCT 54.4* 56.7* 54.6* 57.1*  MCV 102.1* 104.4* 105.6* 104.8*  PLT 141* 144* 139* 108*      No results found for: HEPBSAG, HEPBSAB, HEPBIGM    Microbiology:  Recent Results (from the past 240 hour(s))  Resp Panel by RT-PCR (Flu A&B, Covid) Nasopharyngeal Swab     Status: None   Collection Time: 01/28/22  7:08 PM   Specimen: Nasopharyngeal Swab; Nasopharyngeal(NP) swabs in vial transport medium  Result Value Ref Range Status   SARS Coronavirus 2 by RT PCR NEGATIVE NEGATIVE Final    Comment: (NOTE) SARS-CoV-2 target nucleic acids are NOT DETECTED.  The SARS-CoV-2 RNA is generally detectable in upper respiratory specimens during the acute phase of infection. The lowest concentration of SARS-CoV-2 viral copies this assay can detect is 138 copies/mL. A negative result does not preclude SARS-Cov-2 infection and should not be used as the sole basis for treatment or other patient management decisions. A negative result may occur with  improper specimen collection/handling, submission of  specimen other than nasopharyngeal swab, presence of viral mutation(s) within the areas targeted by this assay, and inadequate number of viral copies(<138 copies/mL). A negative result must be combined with clinical observations, patient history, and epidemiological information. The expected result is Negative.  Fact Sheet for Patients:  EntrepreneurPulse.com.au  Fact Sheet for Healthcare Providers:  IncredibleEmployment.be  This test is no t yet approved or cleared by the [place] FDA and  has been authorized for detection and/or diagnosis of SARS-CoV-2 by FDA under an Emergency Use Authorization (EUA). This EUA will remain  in effect (meaning this test can be used) for the duration of the COVID-19 declaration under Section 564(b)(1) of the Act, 21 U.S.C.section 360bbb-3(b)(1), unless the authorization is terminated  or revoked sooner.       Influenza A by PCR NEGATIVE NEGATIVE Final   Influenza B by PCR NEGATIVE NEGATIVE Final    Comment: (NOTE) The Xpert Xpress SARS-CoV-2/FLU/RSV plus assay is intended as an aid in the diagnosis of influenza from Nasopharyngeal swab specimens and should not be used as a sole basis for treatment. Nasal washings and aspirates are unacceptable for Xpert Xpress SARS-CoV-2/FLU/RSV testing.  Fact Sheet for Patients: EntrepreneurPulse.com.au  Fact Sheet for Healthcare Providers: IncredibleEmployment.be  This test is not yet approved or cleared by the [place] FDA and has been authorized for detection and/or diagnosis of SARS-CoV-2 by FDA under an Emergency Use Authorization (EUA). This EUA will remain in effect (meaning this test can be used) for the duration of the COVID-19 declaration under Section 564(b)(1) of the Act, 21 U.S.C. section 360bbb-3(b)(1), unless the authorization is terminated or revoked.  Performed at [facility], [address] [place], [place] [address]   MRSA Next Gen by PCR, Nasal     Status: None   Collection Time: 01/28/22  8:47 PM   Specimen: Nasal Mucosa; Nasal Swab  Result Value Ref Range Status   MRSA by PCR Next Gen NOT DETECTED NOT DETECTED Final    Comment: (NOTE) The GeneXpert MRSA Assay (FDA approved for NASAL specimens only), is one component of a comprehensive MRSA colonization surveillance program. It is not intended to diagnose MRSA infection nor to guide or monitor treatment for MRSA infections. Test performance is not FDA approved in patients less than 44 years old. Performed at [facility], [address] [place], [place] [address]     Coagulation Studies: No results for input(s): LABPROT, INR in the last 72  hours.  Urinalysis: Recent Labs    01/30/22 2331  COLORURINE YELLOW*  LABSPEC 1.008  PHURINE 5.0  GLUCOSEU NEGATIVE  HGBUR NEGATIVE  BILIRUBINUR NEGATIVE  KETONESUR NEGATIVE  PROTEINUR NEGATIVE  NITRITE NEGATIVE  LEUKOCYTESUR NEGATIVE       Imaging: No results found.   Medications:    sodium chloride     albumin human 12.5 g (01/31/22 0855)   furosemide (LASIX) 200 mg in dextrose 5% 100 mL (2mg /mL) infusion 8 mg/hr (01/30/22 2347)    Chlorhexidine Gluconate Cloth  6 each Topical Q0600   heparin  5,000 Units Subcutaneous Q8H   mouth rinse  15 mL Mouth Rinse BID   midodrine  5 mg Oral TID WC   sodium chloride flush  3 mL Intravenous Q12H   sodium chloride, acetaminophen, docusate sodium, ondansetron (ZOFRAN) IV, polyethylene glycol, sodium chloride flush  Assessment/ Plan:  76 y.o. male with chronic kidney disease stage IIIa/B, hypertension, chronic systolic CHF, erythrocytosis requiring therapeutic phlebotomies, AAA, Prostate enlargement, Emphysema, pulmonary hypertension admitted on 01/28/2022 for Acute respiratory failure with hypoxia (HCC) [J96.01]  2D echo-June 22, 2020-LVEF 45 to 50%, normal right ventricular and pulmonary artery systolic  pressure 2D echo-January 29, 2022-results show LVEF is normal at 55 to 60% with normal diastolic parameters however right ventricular size is severely enlarged and there is moderately elevated pulmonary artery systolic pressure and right atrial size is moderately dilated.  #Acute kidney injury #Chronic kidney disease stage IIIb, baseline creatinine 1.76/GFR 40 from November 19, 2021 # Anasarca # hypoalbuminemia # Hypotension #Pulmonary hypertension  Acute kidney injury likely secondary to volume shifts.  Patient has severe pulmonary hypertension as noted in the 2D echo.  He also has severe volume overload.  Plan: Renal function slowly improving with adequate urine output recorded Continue IV Lasix drip and IV albumin Avoid hypotension. Agree with midodrine Continue supportive care    LOS: [place], NP 2/16/202312:44 [place], [place]

## 2022-01-31 NOTE — Progress Notes (Signed)
PROGRESS NOTE    John [name]  XHB:[id number] DOB: 07/27/1946 DOA: 01/28/2022 PCP: Tracie Harrier, MD  [id number]   Assessment & Plan:   Principal Problem:   Acute respiratory failure with hypoxia ([place]) Active Problems:   Cardiorenal syndrome, stage 1-4 or unspecified chronic kidney disease, with heart failure (HCC)   Acute left systolic heart failure (HCC)   Cor pulmonale, acute (HCC)   Renal failure syndrome   CKD (chronic kidney disease) stage 4, GFR 15-29 ml/min (HCC)   COPD exacerbation (HCC)   Cardiogenic shock (HCC)   Early Steel is a 76yo AAM with hx of CHF, CAD, COPD who presented with acute hypoxemic respiratory failure w/generalized swelling and anasarca w/hypotension. Patient presented to outpatient clinic on 2/13 for scheduled lasix therapy but was unable to receive due to hypotension w/systolics in the 81O. Patient then presented to the ED and was admitted by PCCM to start Dopamine infusion to improve blood pressure and started on Lasix gtt. The patient was placed on BIPAP to improve his oxygen saturation.  Weaned off Dopa gtt and BiPAP on 2/14 and transferred to hospitalist service on 2/15.   Acute hypoxemic respiratory failure --being treated for CHF exacerbation --was on BiPAP and weaned down to 4L --Continue supplemental O2 to keep sats between 88-92%, wean as tolerated  Acute on Chronic systolic and diastolic heart failure --Last ECHO 2021: EF 45-50%, no wall motion abnormalities --current Echo with improved LVEF to 55-60%, however, with moderately elevated  pulmonary artery systolic pressure.  --cardiology following Plan: --cont lasix gtt, per nephro --cont midodrine and IV albumin support --GDMT held due to hypotension   COPD  --unclear if having acute exacerbation.  No significant wheezing.  Pt denied coughing.  Pt was not started on steroid or scheduled nebs. --monitor for now   AKI On CKD 3b baseline creatinine 1.76/GFR 40 from  November 19, 2021 --nephro consulted.  Cr improving with lasix gtt. Plan: --cont lasix gtt --avoid hypotension --cont midodrine, low dose iv albumin supplementation   DVT prophylaxis: Heparin SQ Code Status: Full code  Family Communication: wife updated at bedside today Level of care: Progressive Dispo:   The patient is from: home Anticipated d/c is to: home Anticipated d/c date is: 1-2 days Patient currently is not medically ready to d/c due to: IV lasix gtt   Subjective and Interval History:  Pt denied cough.     Objective: Vitals:   01/31/22 0425 01/31/22 0756 01/31/22 1139 01/31/22 1644  BP: 135/83 115/84 133/82 111/85  Pulse: 89 87 81 (!) 104  Resp: 18 18 18 18   Temp: 97.7 F (36.5 C) 98.1 F (36.7 C) 98 F (36.7 C) 98 F (36.7 C)  TempSrc:   Oral   SpO2: 91% 93% 99% 91%  Weight: 107 kg     Height:        Intake/Output Summary (Last 24 hours) at 01/31/2022 1903 Last data filed at 01/31/2022 1831 Gross per 24 hour  Intake 675.9 ml  Output 2750 ml  Net -2074.1 ml   Filed Weights   01/30/22 0500 01/30/22 1637 01/31/22 0425  Weight: 110.5 kg 108.8 kg 107 kg    Examination:   Constitutional: NAD, lethargic but followed commands and answered questions HEENT: conjunctivae and lids normal, EOMI CV: No cyanosis.   RESP: normal respiratory effort, mild crackles, no wheezes, on 4L  Extremities: tense edema in BLE SKIN: warm, dry   Data Reviewed: I have personally reviewed following labs and imaging studies  CBC: Recent  Labs  Lab 01/28/22 1603 01/28/22 2200 01/29/22 0404 01/31/22 0422  WBC 11.5* 10.2 9.2 10.4  NEUTROABS 9.4*  --   --   --   HGB 17.0 17.5* 16.3 16.8  HCT 54.4* 56.7* 54.6* 57.1*  MCV 102.1* 104.4* 105.6* 104.8*  PLT 141* 144* 139* 272*   Basic Metabolic Panel: Recent Labs  Lab 01/25/22 0820 01/28/22 1603 01/28/22 2200 01/29/22 0404 01/29/22 1812 01/30/22 0232 01/30/22 0451 01/30/22 1109 01/31/22 0422  NA 141   < >  --  140  142 143 143 142 145  K 4.0   < >  --  4.1 4.6 4.3 4.4 3.8 3.8  CL 99   < >  --  99 100 101 99 97* 97*  CO2 33*   < >  --  34* 34* 35* 37* 38* 43*  GLUCOSE 121*   < >  --  126* 115* 111* 122* 97 104*  BUN 40*   < >  --  60* 55* 47* 44* 42* 32*  CREATININE 2.05*   < >  --  3.09* 2.70* 2.47* 2.47* 2.26* 1.87*  CALCIUM 9.1   < >  --  8.7* 9.0 8.9 9.1 9.1 9.1  MG  --   --  2.5* 2.4  --   --  2.4  --  2.1  PHOS 4.0  --   --  5.6*  --   --   --   --   --    < > = values in this interval not displayed.   GFR: Estimated Creatinine Clearance: 41.2 mL/min (A) (by C-G formula based on SCr of 1.87 mg/dL (H)). Liver Function Tests: Recent Labs  Lab 01/25/22 0820 01/28/22 1603 01/29/22 0404  AST  --  25 23  ALT  --  21 21  ALKPHOS  --  72 63  BILITOT  --  2.1* 1.4*  PROT  --  5.6* 5.4*  ALBUMIN 3.1* 2.8* 2.8*   No results for input(s): LIPASE, AMYLASE in the last 168 hours. No results for input(s): AMMONIA in the last 168 hours. Coagulation Profile: No results for input(s): INR, PROTIME in the last 168 hours. Cardiac Enzymes: No results for input(s): CKTOTAL, CKMB, CKMBINDEX, TROPONINI in the last 168 hours. BNP (last 3 results) No results for input(s): PROBNP in the last 8760 hours. HbA1C: No results for input(s): HGBA1C in the last 72 hours. CBG: Recent Labs  Lab 01/28/22 2038  GLUCAP 92   Lipid Profile: No results for input(s): CHOL, HDL, LDLCALC, TRIG, CHOLHDL, LDLDIRECT in the last 72 hours. Thyroid Function Tests: No results for input(s): TSH, T4TOTAL, FREET4, T3FREE, THYROIDAB in the last 72 hours. Anemia Panel: No results for input(s): VITAMINB12, FOLATE, FERRITIN, TIBC, IRON, RETICCTPCT in the last 72 hours. Sepsis Labs: Recent Labs  Lab 01/28/22 2200 01/29/22 0005  LATICACIDVEN 1.2 1.7    Recent Results (from the past 240 hour(s))  Resp Panel by RT-PCR (Flu A&B, Covid) Nasopharyngeal Swab     Status: None   Collection Time: 01/28/22  7:08 PM   Specimen:  Nasopharyngeal Swab; Nasopharyngeal(NP) swabs in vial transport medium  Result Value Ref Range Status   SARS Coronavirus 2 by RT PCR NEGATIVE NEGATIVE Final    Comment: (NOTE) SARS-CoV-2 target nucleic acids are NOT DETECTED.  The SARS-CoV-2 RNA is generally detectable in upper respiratory specimens during the acute phase of infection. The lowest concentration of SARS-CoV-2 viral copies this assay can detect is 138 copies/mL. A negative result  does not preclude SARS-Cov-2 infection and should not be used as the sole basis for treatment or other patient management decisions. A negative result may occur with  improper specimen collection/handling, submission of specimen other than nasopharyngeal swab, presence of viral mutation(s) within the areas targeted by this assay, and inadequate number of viral copies(<138 copies/mL). A negative result must be combined with clinical observations, patient history, and epidemiological information. The expected result is Negative.  Fact Sheet for Patients:  EntrepreneurPulse.com.au  Fact Sheet for Healthcare Providers:  IncredibleEmployment.be  This test is no t yet approved or cleared by the [place] FDA and  has been authorized for detection and/or diagnosis of SARS-CoV-2 by FDA under an Emergency Use Authorization (EUA). This EUA will remain  in effect (meaning this test can be used) for the duration of the COVID-19 declaration under Section 564(b)(1) of the Act, 21 U.S.C.section 360bbb-3(b)(1), unless the authorization is terminated  or revoked sooner.       Influenza A by PCR NEGATIVE NEGATIVE Final   Influenza B by PCR NEGATIVE NEGATIVE Final    Comment: (NOTE) The Xpert Xpress SARS-CoV-2/FLU/RSV plus assay is intended as an aid in the diagnosis of influenza from Nasopharyngeal swab specimens and should not be used as a sole basis for treatment. Nasal washings and aspirates are unacceptable for  Xpert Xpress SARS-CoV-2/FLU/RSV testing.  Fact Sheet for Patients: EntrepreneurPulse.com.au  Fact Sheet for Healthcare Providers: IncredibleEmployment.be  This test is not yet approved or cleared by the [place] FDA and has been authorized for detection and/or diagnosis of SARS-CoV-2 by FDA under an Emergency Use Authorization (EUA). This EUA will remain in effect (meaning this test can be used) for the duration of the COVID-19 declaration under Section 564(b)(1) of the Act, 21 U.S.C. section 360bbb-3(b)(1), unless the authorization is terminated or revoked.  Performed at [facility], [address] [place], [place] [address]   MRSA Next Gen by PCR, Nasal     Status: None   Collection Time: 01/28/22  8:47 PM   Specimen: Nasal Mucosa; Nasal Swab  Result Value Ref Range Status   MRSA by PCR Next Gen NOT DETECTED NOT DETECTED Final    Comment: (NOTE) The GeneXpert MRSA Assay (FDA approved for NASAL specimens only), is one component of a comprehensive MRSA colonization surveillance program. It is not intended to diagnose MRSA infection nor to guide or monitor treatment for MRSA infections. Test performance is not FDA approved in patients less than 33 years old. Performed at [facility], [address], [place], [place] [address]       Radiology Studies: No results found.   Scheduled Meds:  Chlorhexidine Gluconate Cloth  6 each Topical Q0600   heparin  5,000 Units Subcutaneous Q8H   mouth rinse  15 mL Mouth Rinse BID   midodrine  5 mg Oral TID WC   sodium chloride flush  3 mL Intravenous Q12H   Continuous Infusions:  sodium chloride     albumin human 12.5 g (01/31/22 0855)   furosemide (LASIX) 200 mg in dextrose 5% 100 mL (2mg /mL) infusion 8 mg/hr (01/31/22 1836)     LOS: 3 days     Enzo Bi, MD [facility] If 7PM-7AM, please contact night-coverage 01/31/2022, 7:03 PM

## 2022-01-31 NOTE — Progress Notes (Signed)
PT Cancellation Note  Patient Details Name: John [name] MRN: [id number] DOB: 1946-05-30   Cancelled Treatment:    Reason Eval/Treat Not Completed: Patient declined, no reason specified Pt sitting up at EOB on arrival, remembers this PT from yesterday.  States he has not been up today but "Just not feeling it today" when PT suggested doing some ambulation.  Attempted to convince him the he needs to be staying active and while he agreed he continued to refused PT, even when PT offered to not necessarily get up but just do some exercises in bed.  Will maintain on caseload and continue to attempt as appropriate.  Kreg Shropshire, DPT 01/31/2022, 5:30 PM

## 2022-01-31 NOTE — Progress Notes (Signed)
Patient still having moments of confusion.  Patient removed his nasal cannula.  O2 was check on room air-  sats were in the 60s.  Placed patient back on O2 at 2L and encouraged patient to take deep breaths.  After a minute O2 sats only improved to upper 70s increased O2 to 4L.    Saturation improved to 95% on 4L.  Left on 4L for a few minutes then reduced down to 3L.  Patient O2 sats remained in the mid 90s.  Will continue to monitor

## 2022-02-01 ENCOUNTER — Ambulatory Visit: Payer: Medicare HMO

## 2022-02-01 ENCOUNTER — Inpatient Hospital Stay: Payer: Medicare HMO | Admitting: Oncology

## 2022-02-01 ENCOUNTER — Inpatient Hospital Stay: Payer: Medicare HMO | Attending: Oncology

## 2022-02-01 ENCOUNTER — Inpatient Hospital Stay: Payer: Medicare HMO

## 2022-02-01 DIAGNOSIS — J9601 Acute respiratory failure with hypoxia: Secondary | ICD-10-CM | POA: Diagnosis not present

## 2022-02-01 LAB — BASIC METABOLIC PANEL
Anion gap: 7 (ref 5–15)
BUN: 26 mg/dL — ABNORMAL HIGH (ref 8–23)
CO2: 43 mmol/L — ABNORMAL HIGH (ref 22–32)
Calcium: 8.8 mg/dL — ABNORMAL LOW (ref 8.9–10.3)
Chloride: 95 mmol/L — ABNORMAL LOW (ref 98–111)
Creatinine, Ser: 1.68 mg/dL — ABNORMAL HIGH (ref 0.61–1.24)
GFR, Estimated: 42 mL/min — ABNORMAL LOW (ref >60)
Glucose, Bld: 103 mg/dL — ABNORMAL HIGH (ref 70–99)
Potassium: 3.4 mmol/L — ABNORMAL LOW (ref 3.5–5.1)
Sodium: 145 mmol/L (ref 135–145)

## 2022-02-01 LAB — CBC
HCT: 51.2 % (ref 39.0–52.0)
Hemoglobin: 15.4 g/dL (ref 13.0–17.0)
MCH: 31.2 pg (ref 26.0–34.0)
MCHC: 30.1 g/dL (ref 30.0–36.0)
MCV: 103.6 fL — ABNORMAL HIGH (ref 80.0–100.0)
Platelets: 101 10*3/uL — ABNORMAL LOW (ref 150–400)
RBC: 4.94 MIL/uL (ref 4.22–5.81)
RDW: 13.8 % (ref 11.5–15.5)
WBC: 9 10*3/uL (ref 4.0–10.5)
nRBC: 0 % (ref 0.0–0.2)

## 2022-02-01 LAB — MAGNESIUM: Magnesium: 2 mg/dL (ref 1.7–2.4)

## 2022-02-01 MED ORDER — POTASSIUM CHLORIDE CRYS ER 20 MEQ PO TBCR
40.0000 meq | EXTENDED_RELEASE_TABLET | Freq: Once | ORAL | Status: AC
  Administered 2022-02-01: 40 meq via ORAL
  Filled 2022-02-01: qty 2

## 2022-02-01 NOTE — TOC Initial Note (Addendum)
Transition of Care [facility]) - Initial/Assessment Note    Patient Details  Name: John [name] MRN: [id number] Date of Birth: October 15, 1946  Transition of Care [facility]) CM/SW Contact:    Alberteen Sam, LCSW Phone Number: 02/01/2022, 10:54 AM  Clinical Narrative:                  Update: Tommi Rumps with [facility] able to accept patient for [facility] PT services.    CSW met with patient at bedside to discuss [facility] recommendations, is agreeable to [facility] PT and reports he was recently active with a [facility] agency however cannot remember the name. Patient has a walker at home and identifies no further dme needs.   CSW will follow up on patient's past [place] agency and see if they can accept again.   Patient reports at time of discharge he has a friend who will take him home.   Expected Discharge Plan: [place] Barriers to Discharge: Continued Medical Work up   Patient Goals and CMS Choice Patient states their goals for this hospitalization and ongoing recovery are:: to go home CMS Medicare.gov Compare Post Acute Care list provided to:: Patient Choice offered to / list presented to : Patient  Expected Discharge Plan and Services Expected Discharge Plan: [place]                                              Prior Living Arrangements/Services   Lives with:: Self                   Activities of Daily Living      Permission Sought/Granted                  Emotional Assessment       Orientation: : Oriented to Self, Oriented to Place, Oriented to  Time, Oriented to Situation Alcohol / Substance Use: Not Applicable Psych Involvement: No (comment)  Admission diagnosis:  Acute respiratory failure with hypoxia ([place]) [J96.01] Patient Active Problem List   Diagnosis Date Noted   Acute respiratory failure with hypoxia ([place]) 01/28/2022   Cardiorenal syndrome, stage 1-4 or unspecified chronic kidney disease, with heart failure ([place]) 01/28/2022   Acute left  systolic heart failure ([place]) 01/28/2022   Cor pulmonale, acute ([place]) 01/28/2022   Renal failure syndrome 01/28/2022   CKD (chronic kidney disease) stage 4, GFR 15-29 ml/min ([place]) 01/28/2022   COPD exacerbation ([place]) 01/28/2022   Cardiogenic shock ([place]) 01/28/2022   Erythrocytosis 12/20/2021   Thrombocytopenia ([place]) 12/20/2021   Acute CHF (congestive heart failure) ([place]) 06/21/2020   Hypertension    COPD (chronic obstructive pulmonary disease) ([place]) 02/16/2020   Rupture of left quadriceps tendon 02/15/2020   Community acquired pneumonia 09/01/2019   Pneumonia 09/01/2019   PCP:  Tracie Harrier, MD Pharmacy:   [facility] [address] ([address]), [place] [address] [place]) [place] [address] Phone: [phone] Fax: [phone]     Social Determinants of Health (SDOH) Interventions    Readmission Risk Interventions No flowsheet data found.

## 2022-02-01 NOTE — Care Management Important Message (Signed)
Important Message  Patient Details  Name: John [name] MRN: [id number] Date of Birth: Feb 06, 1946   Medicare Important Message Given:  Yes     [name] 02/01/2022, 3:34 PM

## 2022-02-01 NOTE — Progress Notes (Signed)
PROGRESS NOTE    John [name]  ZJI:[id number] DOB: 03/24/1946 DOA: 01/28/2022 PCP: Tracie Harrier, MD  [id number]   Assessment & Plan:   Principal Problem:   Acute respiratory failure with hypoxia ([place]) Active Problems:   Cardiorenal syndrome, stage 1-4 or unspecified chronic kidney disease, with heart failure (HCC)   Acute left systolic heart failure (HCC)   Cor pulmonale, acute (HCC)   Renal failure syndrome   CKD (chronic kidney disease) stage 4, GFR 15-29 ml/min (HCC)   COPD exacerbation (HCC)   Cardiogenic shock (HCC)   John [name] is a 76yo AAM with hx of CHF, CAD, COPD who presented with acute hypoxemic respiratory failure w/generalized swelling and anasarca w/hypotension. Patient presented to outpatient clinic on 2/13 for scheduled lasix therapy but was unable to receive due to hypotension w/systolics in the 17P. Patient then presented to the ED and was admitted by PCCM to start Dopamine infusion to improve blood pressure and started on Lasix gtt. The patient was placed on BIPAP to improve his oxygen saturation.  Weaned off Dopa gtt and BiPAP on 2/14 and transferred to hospitalist service on 2/15.   Acute hypoxemic respiratory failure --being treated for CHF exacerbation --was on BiPAP and weaned down to 4L --Continue supplemental O2 to keep sats between 88-92%, wean as tolerated  Acute on Chronic systolic and diastolic heart failure --Last ECHO 2021: EF 45-50%, no wall motion abnormalities --current Echo with improved LVEF to 55-60%, however, with moderately elevated  pulmonary artery systolic pressure.  --cardiology following Plan: --cont lasix gtt, per nephro --cont midodrine and IV albumin support --GDMT held due to hypotension   COPD  --unclear if having acute exacerbation.  No significant wheezing.  Pt denied coughing.  Pt was not started on steroid or scheduled nebs. --monitor for now   AKI On CKD 3b baseline creatinine 1.76/GFR 40 from  November 19, 2021 --nephro consulted.  Cr improving with lasix gtt. Plan: --cont lasix gtt --avoid hypotension --cont midodrine, low dose iv albumin supplementation   DVT prophylaxis: Heparin SQ Code Status: Full code  Family Communication:  Level of care: Progressive Dispo:   The patient is from: home Anticipated d/c is to: home Anticipated d/c date is: 1-2 days Patient currently is not medically ready to d/c due to: IV lasix gtt   Subjective and Interval History:  Pt was sleepy but woke up to say he had good oral intake and urine output.    RN reported pt needed 4L O2 at rest   Objective: Vitals:   02/01/22 0623 02/01/22 0808 02/01/22 1152 02/01/22 1708  BP:  117/85 109/86 99/66  Pulse:  84 76 91  Resp: 20 18 18 18   Temp:  97.9 F (36.6 C) 98.3 F (36.8 C) 98.3 F (36.8 C)  TempSrc:      SpO2:  98% 100% 95%  Weight:      Height:        Intake/Output Summary (Last 24 hours) at 02/01/2022 1728 Last data filed at 02/01/2022 1046 Gross per 24 hour  Intake 626.35 ml  Output 700 ml  Net -73.65 ml   Filed Weights   01/30/22 1637 01/31/22 0425 02/01/22 0500  Weight: 108.8 kg 107 kg 104.5 kg    Examination:   Constitutional: NAD, sleeping but arousable, sitting in recliner HEENT: conjunctivae and lids normal, EOMI CV: No cyanosis.   RESP: normal respiratory effort, on 4L Extremities: improved edema in BLE SKIN: warm, dry   Data Reviewed: I have personally reviewed following  labs and imaging studies  CBC: Recent Labs  Lab 01/28/22 1603 01/28/22 2200 01/29/22 0404 01/31/22 0422 02/01/22 0611  WBC 11.5* 10.2 9.2 10.4 9.0  NEUTROABS 9.4*  --   --   --   --   HGB 17.0 17.5* 16.3 16.8 15.4  HCT 54.4* 56.7* 54.6* 57.1* 51.2  MCV 102.1* 104.4* 105.6* 104.8* 103.6*  PLT 141* 144* 139* 108* 676*   Basic Metabolic Panel: Recent Labs  Lab 01/28/22 2200 01/29/22 0404 01/29/22 1812 01/30/22 0232 01/30/22 0451 01/30/22 1109 01/31/22 0422 02/01/22 0611   NA  --  140   < > 143 143 142 145 145  K  --  4.1   < > 4.3 4.4 3.8 3.8 3.4*  CL  --  99   < > 101 99 97* 97* 95*  CO2  --  34*   < > 35* 37* 38* 43* 43*  GLUCOSE  --  126*   < > 111* 122* 97 104* 103*  BUN  --  60*   < > 47* 44* 42* 32* 26*  CREATININE  --  3.09*   < > 2.47* 2.47* 2.26* 1.87* 1.68*  CALCIUM  --  8.7*   < > 8.9 9.1 9.1 9.1 8.8*  MG 2.5* 2.4  --   --  2.4  --  2.1 2.0  PHOS  --  5.6*  --   --   --   --   --   --    < > = values in this interval not displayed.   GFR: Estimated Creatinine Clearance: 45.3 mL/min (A) (by C-G formula based on SCr of 1.68 mg/dL (H)). Liver Function Tests: Recent Labs  Lab 01/28/22 1603 01/29/22 0404  AST 25 23  ALT 21 21  ALKPHOS 72 63  BILITOT 2.1* 1.4*  PROT 5.6* 5.4*  ALBUMIN 2.8* 2.8*   No results for input(s): LIPASE, AMYLASE in the last 168 hours. No results for input(s): AMMONIA in the last 168 hours. Coagulation Profile: No results for input(s): INR, PROTIME in the last 168 hours. Cardiac Enzymes: No results for input(s): CKTOTAL, CKMB, CKMBINDEX, TROPONINI in the last 168 hours. BNP (last 3 results) No results for input(s): PROBNP in the last 8760 hours. HbA1C: No results for input(s): HGBA1C in the last 72 hours. CBG: Recent Labs  Lab 01/28/22 2038  GLUCAP 92   Lipid Profile: No results for input(s): CHOL, HDL, LDLCALC, TRIG, CHOLHDL, LDLDIRECT in the last 72 hours. Thyroid Function Tests: No results for input(s): TSH, T4TOTAL, FREET4, T3FREE, THYROIDAB in the last 72 hours. Anemia Panel: No results for input(s): VITAMINB12, FOLATE, FERRITIN, TIBC, IRON, RETICCTPCT in the last 72 hours. Sepsis Labs: Recent Labs  Lab 01/28/22 2200 01/29/22 0005  LATICACIDVEN 1.2 1.7    Recent Results (from the past 240 hour(s))  Resp Panel by RT-PCR (Flu A&B, Covid) Nasopharyngeal Swab     Status: None   Collection Time: 01/28/22  7:08 PM   Specimen: Nasopharyngeal Swab; Nasopharyngeal(NP) swabs in vial transport medium   Result Value Ref Range Status   SARS Coronavirus 2 by RT PCR NEGATIVE NEGATIVE Final    Comment: (NOTE) SARS-CoV-2 target nucleic acids are NOT DETECTED.  The SARS-CoV-2 RNA is generally detectable in upper respiratory specimens during the acute phase of infection. The lowest concentration of SARS-CoV-2 viral copies this assay can detect is 138 copies/mL. A negative result does not preclude SARS-Cov-2 infection and should not be used as the sole basis for treatment or  other patient management decisions. A negative result may occur with  improper specimen collection/handling, submission of specimen other than nasopharyngeal swab, presence of viral mutation(s) within the areas targeted by this assay, and inadequate number of viral copies(<138 copies/mL). A negative result must be combined with clinical observations, patient history, and epidemiological information. The expected result is Negative.  Fact Sheet for Patients:  EntrepreneurPulse.com.au  Fact Sheet for Healthcare Providers:  IncredibleEmployment.be  This test is no t yet approved or cleared by the [place] FDA and  has been authorized for detection and/or diagnosis of SARS-CoV-2 by FDA under an Emergency Use Authorization (EUA). This EUA will remain  in effect (meaning this test can be used) for the duration of the COVID-19 declaration under Section 564(b)(1) of the Act, 21 U.S.C.section 360bbb-3(b)(1), unless the authorization is terminated  or revoked sooner.       Influenza A by PCR NEGATIVE NEGATIVE Final   Influenza B by PCR NEGATIVE NEGATIVE Final    Comment: (NOTE) The Xpert Xpress SARS-CoV-2/FLU/RSV plus assay is intended as an aid in the diagnosis of influenza from Nasopharyngeal swab specimens and should not be used as a sole basis for treatment. Nasal washings and aspirates are unacceptable for Xpert Xpress SARS-CoV-2/FLU/RSV testing.  Fact Sheet for  Patients: EntrepreneurPulse.com.au  Fact Sheet for Healthcare Providers: IncredibleEmployment.be  This test is not yet approved or cleared by the [place] FDA and has been authorized for detection and/or diagnosis of SARS-CoV-2 by FDA under an Emergency Use Authorization (EUA). This EUA will remain in effect (meaning this test can be used) for the duration of the COVID-19 declaration under Section 564(b)(1) of the Act, 21 U.S.C. section 360bbb-3(b)(1), unless the authorization is terminated or revoked.  Performed at [facility], [address] [place], [place] [address]   MRSA Next Gen by PCR, Nasal     Status: None   Collection Time: 01/28/22  8:47 PM   Specimen: Nasal Mucosa; Nasal Swab  Result Value Ref Range Status   MRSA by PCR Next Gen NOT DETECTED NOT DETECTED Final    Comment: (NOTE) The GeneXpert MRSA Assay (FDA approved for NASAL specimens only), is one component of a comprehensive MRSA colonization surveillance program. It is not intended to diagnose MRSA infection nor to guide or monitor treatment for MRSA infections. Test performance is not FDA approved in patients less than 33 years old. Performed at [facility], [address] [place], [place] [address]       Radiology Studies: No results found.   Scheduled Meds:  Chlorhexidine Gluconate Cloth  6 each Topical Q0600   heparin  5,000 Units Subcutaneous Q8H   mouth rinse  15 mL Mouth Rinse BID   midodrine  5 mg Oral TID WC   sodium chloride flush  3 mL Intravenous Q12H   Continuous Infusions:  sodium chloride     albumin human Stopped (02/01/22 0935)   furosemide (LASIX) 200 mg in dextrose 5% 100 mL (2mg /mL) infusion 8 mg/hr (02/01/22 0936)     LOS: 4 days     Enzo Bi, MD [facility] If 7PM-7AM, please contact night-coverage 02/01/2022, 5:28 PM

## 2022-02-01 NOTE — Progress Notes (Signed)
[place], [place] 02/01/22  Subjective:   Hospital day # 4  Patient known to our practice from outpatient follow-up.  He follows up for chronic kidney disease stage III.  His usual creatinine fluctuates between 1.5-1.7. He was scheduled to get IV furosemide in same-day surgery but could not receive it because Blood pressure was in the 70s.    Update Patient seen sitting up in bed, alert and more talkative today States he was feeling really badly yesterday Denies shortness of breath Denies nausea and vomiting Patient seen later in morning sitting up in recliner.  Creatinine improving slowly 1.7 Recorded urine output of 2.75 L in preceding 24 hours  Renal: 02/16 0701 - 02/17 0700 In: 626.4 [P.O.:480; I.V.:121.4; IV Piggyback:25] Out: 2750 [Urine:2750] Lab Results  Component Value Date   CREATININE 1.68 (H) 02/01/2022   CREATININE 1.87 (H) 01/31/2022   CREATININE 2.26 (H) 01/30/2022     Objective:  Vital signs in last 24 hours:  Temp:  [97.7 F (36.5 C)-98.7 F (37.1 C)] 98.3 F (36.8 C) (02/17 1152) Pulse Rate:  [76-104] 76 (02/17 1152) Resp:  [13-20] 18 (02/17 1152) BP: (109-123)/(72-86) 109/86 (02/17 1152) SpO2:  [91 %-100 %] 100 % (02/17 1152) Weight:  [104.5 kg] 104.5 kg (02/17 0500)  Weight change: -4.318 kg Filed Weights   01/30/22 1637 01/31/22 0425 02/01/22 0500  Weight: 108.8 kg 107 kg 104.5 kg    Intake/Output:    Intake/Output Summary (Last 24 hours) at 02/01/2022 1445 Last data filed at 02/01/2022 1046 Gross per 24 hour  Intake 626.35 ml  Output 1400 ml  Net -773.65 ml      Physical Exam: General: No acute distress  HEENT Moist oral mucous membranes  Pulm/lungs Bilateral pulmonary crackles  CVS/Heart regular  Abdomen:  Soft, nontender  Extremities: +++ lower extremity edema bilaterally  Neurologic: Alert, able to answer few simple questions  Skin: Warm, dry          Basic Metabolic Panel:  Recent Labs   Lab 01/28/22 2200 01/29/22 0404 01/29/22 1812 01/30/22 0232 01/30/22 0451 01/30/22 1109 01/31/22 0422 02/01/22 0611  NA  --  140   < > 143 143 142 145 145  K  --  4.1   < > 4.3 4.4 3.8 3.8 3.4*  CL  --  99   < > 101 99 97* 97* 95*  CO2  --  34*   < > 35* 37* 38* 43* 43*  GLUCOSE  --  126*   < > 111* 122* 97 104* 103*  BUN  --  60*   < > 47* 44* 42* 32* 26*  CREATININE  --  3.09*   < > 2.47* 2.47* 2.26* 1.87* 1.68*  CALCIUM  --  8.7*   < > 8.9 9.1 9.1 9.1 8.8*  MG 2.5* 2.4  --   --  2.4  --  2.1 2.0  PHOS  --  5.6*  --   --   --   --   --   --    < > = values in this interval not displayed.      CBC: Recent Labs  Lab 01/28/22 1603 01/28/22 2200 01/29/22 0404 01/31/22 0422 02/01/22 0611  WBC 11.5* 10.2 9.2 10.4 9.0  NEUTROABS 9.4*  --   --   --   --   HGB 17.0 17.5* 16.3 16.8 15.4  HCT 54.4* 56.7* 54.6* 57.1* 51.2  MCV 102.1* 104.4* 105.6* 104.8* 103.6*  PLT  141* 144* 139* 108* 101*      No results found for: HEPBSAG, HEPBSAB, HEPBIGM    Microbiology:  Recent Results (from the past 240 hour(s))  Resp Panel by RT-PCR (Flu A&B, Covid) Nasopharyngeal Swab     Status: None   Collection Time: 01/28/22  7:08 PM   Specimen: Nasopharyngeal Swab; Nasopharyngeal(NP) swabs in vial transport medium  Result Value Ref Range Status   SARS Coronavirus 2 by RT PCR NEGATIVE NEGATIVE Final    Comment: (NOTE) SARS-CoV-2 target nucleic acids are NOT DETECTED.  The SARS-CoV-2 RNA is generally detectable in upper respiratory specimens during the acute phase of infection. The lowest concentration of SARS-CoV-2 viral copies this assay can detect is 138 copies/mL. A negative result does not preclude SARS-Cov-2 infection and should not be used as the sole basis for treatment or other patient management decisions. A negative result may occur with  improper specimen collection/handling, submission of specimen other than nasopharyngeal swab, presence of viral mutation(s) within  the areas targeted by this assay, and inadequate number of viral copies(<138 copies/mL). A negative result must be combined with clinical observations, patient history, and epidemiological information. The expected result is Negative.  Fact Sheet for Patients:  EntrepreneurPulse.com.au  Fact Sheet for Healthcare Providers:  IncredibleEmployment.be  This test is no t yet approved or cleared by the [place] FDA and  has been authorized for detection and/or diagnosis of SARS-CoV-2 by FDA under an Emergency Use Authorization (EUA). This EUA will remain  in effect (meaning this test can be used) for the duration of the COVID-19 declaration under Section 564(b)(1) of the Act, 21 U.S.C.section 360bbb-3(b)(1), unless the authorization is terminated  or revoked sooner.       Influenza A by PCR NEGATIVE NEGATIVE Final   Influenza B by PCR NEGATIVE NEGATIVE Final    Comment: (NOTE) The Xpert Xpress SARS-CoV-2/FLU/RSV plus assay is intended as an aid in the diagnosis of influenza from Nasopharyngeal swab specimens and should not be used as a sole basis for treatment. Nasal washings and aspirates are unacceptable for Xpert Xpress SARS-CoV-2/FLU/RSV testing.  Fact Sheet for Patients: EntrepreneurPulse.com.au  Fact Sheet for Healthcare Providers: IncredibleEmployment.be  This test is not yet approved or cleared by the [place] FDA and has been authorized for detection and/or diagnosis of SARS-CoV-2 by FDA under an Emergency Use Authorization (EUA). This EUA will remain in effect (meaning this test can be used) for the duration of the COVID-19 declaration under Section 564(b)(1) of the Act, 21 U.S.C. section 360bbb-3(b)(1), unless the authorization is terminated or revoked.  Performed at [facility], [address] [place], [place] [address]   MRSA Next Gen by PCR, Nasal     Status: None    Collection Time: 01/28/22  8:47 PM   Specimen: Nasal Mucosa; Nasal Swab  Result Value Ref Range Status   MRSA by PCR Next Gen NOT DETECTED NOT DETECTED Final    Comment: (NOTE) The GeneXpert MRSA Assay (FDA approved for NASAL specimens only), is one component of a comprehensive MRSA colonization surveillance program. It is not intended to diagnose MRSA infection nor to guide or monitor treatment for MRSA infections. Test performance is not FDA approved in patients less than 42 years old. Performed at [facility], [address] [place], [place] [address]     Coagulation Studies: No results for input(s): LABPROT, INR in the last 72 hours.  Urinalysis: Recent Labs    01/30/22 2331  COLORURINE YELLOW*  LABSPEC 1.008  PHURINE 5.0  GLUCOSEU NEGATIVE  HGBUR NEGATIVE  BILIRUBINUR NEGATIVE  KETONESUR NEGATIVE  PROTEINUR NEGATIVE  NITRITE NEGATIVE  LEUKOCYTESUR NEGATIVE       Imaging: No results found.   Medications:    sodium chloride     albumin human Stopped (02/01/22 0935)   furosemide (LASIX) 200 mg in dextrose 5% 100 mL (2mg /mL) infusion 8 mg/hr (02/01/22 0936)    Chlorhexidine Gluconate Cloth  6 each Topical Q0600   heparin  5,000 Units Subcutaneous Q8H   mouth rinse  15 mL Mouth Rinse BID   midodrine  5 mg Oral TID WC   sodium chloride flush  3 mL Intravenous Q12H   sodium chloride, acetaminophen, docusate sodium, ondansetron (ZOFRAN) IV, polyethylene glycol, sodium chloride flush  Assessment/ Plan:  76 y.o. male with chronic kidney disease stage IIIa/B, hypertension, chronic systolic CHF, erythrocytosis requiring therapeutic phlebotomies, AAA, Prostate enlargement, Emphysema, pulmonary hypertension admitted on 01/28/2022 for Acute respiratory failure with hypoxia (HCC) [J96.01]  2D echo-June 22, 2020-LVEF 45 to 50%, normal right ventricular and pulmonary artery systolic pressure 2D echo-January 29, 2022-results show LVEF is normal at 55 to 60%  with normal diastolic parameters however right ventricular size is severely enlarged and there is moderately elevated pulmonary artery systolic pressure and right atrial size is moderately dilated.  #Acute kidney injury #Chronic kidney disease stage IIIb, baseline creatinine 1.76/GFR 40 from November 19, 2021 # Anasarca # hypoalbuminemia # Hypotension #Pulmonary hypertension  Acute kidney injury likely secondary to volume shifts.  Patient has severe pulmonary hypertension as noted in the 2D echo.  He also has severe volume overload.  Plan: Creatinine slowly improving with acceptable urine output. Continue IV Lasix drip and midodrine Avoid hypotension. Continue supportive care    LOS: [place], NP 2/17/20232:45 [place], [place]

## 2022-02-01 NOTE — Progress Notes (Signed)
Physical Therapy Treatment Patient Details Name: John [name] MRN: [id number] DOB: 1946-08-09 Today's Date: 02/01/2022   History of Present Illness 76 yo M with acute resp distress with hypoxia with generalized swelling and anasarca with low BP.  Acute CHF exacerbation with  progressive renal failure with generalized anasarca with progressive cardio-renal syndrome with COPD exacerbation cor pulmonale and pulmonary HTN    PT Comments    Pt resting in bed upon PT arrival; agreeable to PT session.  Modified independent semi-supine to sitting edge of bed; CGA with transfers using RW; and CGA to ambulate 40 feet with RW--antalgic gait noted (limited distance ambulating d/t pt's reports of L foot pain d/t h/o L plantar-fasciitis).  Pt's O2 sats 84-85% on 3 L O2 at rest beginning of session but O2 sats 92% or greater on 4 L via nasal cannula with activity and end of session at rest (nurse and MD notified pt left on 4 L O2).  Will continue to focus on strengthening and progressive functional mobility during hospitalization.    Recommendations for follow up therapy are one component of a multi-disciplinary discharge planning process, led by the attending physician.  Recommendations may be updated based on patient status, additional functional criteria and insurance authorization.  Follow Up Recommendations  Home health PT     Assistance Recommended at Discharge Intermittent Supervision/Assistance  Patient can return home with the following A little help with walking and/or transfers;Assist for transportation;Assistance with cooking/housework;Help with stairs or ramp for entrance   Equipment Recommendations  Rolling walker (2 wheels)    Recommendations for Other Services       Precautions / Restrictions Precautions Precautions: Fall Restrictions Weight Bearing Restrictions: No     Mobility  Bed Mobility Overal bed mobility: Modified Independent             General bed mobility  comments: Semi-supine to sitting with mild increased effort/time to perform on own    Transfers Overall transfer level: Needs assistance Equipment used: Rolling walker (2 wheels) Transfers: Sit to/from Stand Sit to Stand: Min guard           General transfer comment: mild increased effort to stand from bed up to RW; controlled descent sitting in recliner    Ambulation/Gait Ambulation/Gait assistance: Min guard Gait Distance (Feet): 40 Feet Assistive device: Rolling walker (2 wheels)   Gait velocity: decreased     General Gait Details: antalgic; decreased stance time L LE (d/t reports of L plantarfasciitis pain); intermittent vc's to stay closer to [employer]    Modified Rankin (Stroke Patients Only)       Balance Overall balance assessment: Needs assistance Sitting-balance support: No upper extremity supported, Feet supported Sitting balance-Leahy Scale: Good Sitting balance - Comments: steady sitting reaching within BOS   Standing balance support: Single extremity supported Standing balance-Leahy Scale: Fair Standing balance comment: steady standing with at least single UE support                            Cognition Arousal/Alertness: Awake/alert Behavior During Therapy: WFL for tasks assessed/performed Overall Cognitive Status: Within Functional Limits for tasks assessed  Exercises      General Comments  Nursing cleared pt for participation in physical therapy.  Pt agreeable to PT session.      Pertinent Vitals/Pain Pain Assessment Pain Assessment: Faces Pain Location: L foot (h/o plantar fasciitis) Pain Descriptors / Indicators: Tender, Sore Pain Intervention(s): Limited activity within patient's tolerance, Monitored during session, Repositioned HR WFL during sessions activities.    Home Living                          Prior  Function            PT Goals (current goals can now be found in the care plan section) Acute Rehab PT Goals Patient Stated Goal: go home PT Goal Formulation: With patient Time For Goal Achievement: 02/13/22 Potential to Achieve Goals: Good Progress towards PT goals: Progressing toward goals    Frequency    Min 2X/week      PT Plan Current plan remains appropriate    Co-evaluation              AM-PAC PT "6 Clicks" Mobility   Outcome Measure  Help needed turning from your back to your side while in a flat bed without using bedrails?: None Help needed moving from lying on your back to sitting on the side of a flat bed without using bedrails?: None Help needed moving to and from a bed to a chair (including a wheelchair)?: A Little Help needed standing up from a chair using your arms (e.g., wheelchair or bedside chair)?: A Little Help needed to walk in hospital room?: A Little Help needed climbing 3-5 steps with a railing? : A Little 6 Click Score: 20    End of Session Equipment Utilized During Treatment: Gait belt;Oxygen Activity Tolerance: Patient limited by pain (Limited d/t reports of L plantarfasciitis pain) Patient left: in chair;with call bell/phone within reach;with chair alarm set Nurse Communication: Mobility status;Precautions;Other (comment) (pt's O2 sats and O2 needs (pt left on 4 L O2 via nasal cannula)) PT Visit Diagnosis: Muscle weakness (generalized) (M62.81);Difficulty in walking, not elsewhere classified (R26.2);Unsteadiness on feet (R26.81)     Time: 3267-1245 PT Time Calculation (min) (ACUTE ONLY): 39 min  Charges:  $Gait Training: 8-22 mins $Therapeutic Activity: 23-37 mins                     Leitha Bleak, PT 02/01/22, 1:07 PM

## 2022-02-02 DIAGNOSIS — J9601 Acute respiratory failure with hypoxia: Secondary | ICD-10-CM | POA: Diagnosis not present

## 2022-02-02 LAB — BASIC METABOLIC PANEL WITH GFR
Anion gap: 12 (ref 5–15)
BUN: 27 mg/dL — ABNORMAL HIGH (ref 8–23)
CO2: 40 mmol/L — ABNORMAL HIGH (ref 22–32)
Calcium: 9.3 mg/dL (ref 8.9–10.3)
Chloride: 93 mmol/L — ABNORMAL LOW (ref 98–111)
Creatinine, Ser: 1.57 mg/dL — ABNORMAL HIGH (ref 0.61–1.24)
GFR, Estimated: 45 mL/min — ABNORMAL LOW
Glucose, Bld: 105 mg/dL — ABNORMAL HIGH (ref 70–99)
Potassium: 3.6 mmol/L (ref 3.5–5.1)
Sodium: 145 mmol/L (ref 135–145)

## 2022-02-02 LAB — CBC
HCT: 55.3 % — ABNORMAL HIGH (ref 39.0–52.0)
Hemoglobin: 16.8 g/dL (ref 13.0–17.0)
MCH: 31.5 pg (ref 26.0–34.0)
MCHC: 30.4 g/dL (ref 30.0–36.0)
MCV: 103.6 fL — ABNORMAL HIGH (ref 80.0–100.0)
Platelets: 105 10*3/uL — ABNORMAL LOW (ref 150–400)
RBC: 5.34 MIL/uL (ref 4.22–5.81)
RDW: 14 % (ref 11.5–15.5)
WBC: 10.4 10*3/uL (ref 4.0–10.5)
nRBC: 0 % (ref 0.0–0.2)

## 2022-02-02 LAB — MAGNESIUM: Magnesium: 2 mg/dL (ref 1.7–2.4)

## 2022-02-02 NOTE — Progress Notes (Signed)
PROGRESS NOTE    John [name]  YQM:[id number] DOB: 1946/04/12 DOA: 01/28/2022 PCP: Tracie Harrier, MD  [id number]   Assessment & Plan:   Principal Problem:   Acute respiratory failure with hypoxia ([place]) Active Problems:   Cardiorenal syndrome, stage 1-4 or unspecified chronic kidney disease, with heart failure (HCC)   Acute left systolic heart failure (HCC)   Cor pulmonale, acute (HCC)   Renal failure syndrome   CKD (chronic kidney disease) stage 4, GFR 15-29 ml/min (HCC)   COPD exacerbation (HCC)   Cardiogenic shock (HCC)   John [name] is a 76yo AAM with hx of CHF, CAD, COPD who presented with acute hypoxemic respiratory failure w/generalized swelling and anasarca w/hypotension. Patient presented to outpatient clinic on 2/13 for scheduled lasix therapy but was unable to receive due to hypotension w/systolics in the 52W. Patient then presented to the ED and was admitted by PCCM to start Dopamine infusion to improve blood pressure and started on Lasix gtt. The patient was placed on BIPAP to improve his oxygen saturation.  Weaned off Dopa gtt and BiPAP on 2/14 and transferred to hospitalist service on 2/15.   Acute hypoxemic respiratory failure --being treated for CHF exacerbation --was on BiPAP and weaned down to 2L --Continue supplemental O2 to keep sats between 88-92%, wean as tolerated  Acute on Chronic systolic and diastolic heart failure Mod pulm HTN --Last ECHO 2021: EF 45-50%, no wall motion abnormalities --current Echo with improved LVEF to 55-60%, however, with moderately elevated  pulmonary artery systolic pressure.  --cardiology following Plan: --cont IV lasix gtt, per nephro --cont midodrine and IV albumin support --GDMT held due to hypotension   COPD  --unclear if having acute exacerbation.  No significant wheezing.  Pt denied coughing.  Pt was not started on steroid or scheduled nebs. --monitor for now   AKI, resolved On CKD 3b baseline  creatinine 1.76/GFR 40 from November 19, 2021 --nephro consulted.  Cr improving with lasix gtt. Plan: --cont lasix gtt --avoid hypotension --cont midodrine, low dose iv albumin supplementation   DVT prophylaxis: Heparin SQ Code Status: Full code  Family Communication: wife and son updated at bedside today.  Level of care: Progressive Dispo:   The patient is from: home Anticipated d/c is to: home Anticipated d/c date is: 1-2 days Patient currently is not medically ready to d/c due to: IV lasix gtt   Subjective and Interval History:  Pt reported still having good urine output.     Objective: Vitals:   02/02/22 0545 02/02/22 0809 02/02/22 1128 02/02/22 1519  BP:  107/67 102/63 97/75  Pulse:  (!) 104 (!) 101 83  Resp:  18 18 18   Temp:  97.9 F (36.6 C) 98.4 F (36.9 C) 98 F (36.7 C)  TempSrc:      SpO2:  91% 96% 94%  Weight: 103.4 kg     Height: 5\' 10"  (1.778 m)       Intake/Output Summary (Last 24 hours) at 02/02/2022 1807 Last data filed at 02/02/2022 0900 Gross per 24 hour  Intake 215.37 ml  Output 2700 ml  Net -2484.63 ml   Filed Weights   01/31/22 0425 02/01/22 0500 02/02/22 0545  Weight: 107 kg 104.5 kg 103.4 kg    Examination:   Constitutional: NAD, AAOx3, sitting in recliner HEENT: conjunctivae and lids normal, EOMI CV: No cyanosis.   RESP: normal respiratory effort, on 2L Extremities: still has tense edema in BLE SKIN: warm, dry Neuro: II - XII grossly intact.   Psych:  Normal mood and affect.  Appropriate judgement and reason   Data Reviewed: I have personally reviewed following labs and imaging studies  CBC: Recent Labs  Lab 01/28/22 1603 01/28/22 2200 01/29/22 0404 01/31/22 0422 02/01/22 0611 02/02/22 0634  WBC 11.5* 10.2 9.2 10.4 9.0 10.4  NEUTROABS 9.4*  --   --   --   --   --   HGB 17.0 17.5* 16.3 16.8 15.4 16.8  HCT 54.4* 56.7* 54.6* 57.1* 51.2 55.3*  MCV 102.1* 104.4* 105.6* 104.8* 103.6* 103.6*  PLT 141* 144* 139* 108* 101*  696*   Basic Metabolic Panel: Recent Labs  Lab 01/29/22 0404 01/29/22 1812 01/30/22 0451 01/30/22 1109 01/31/22 0422 02/01/22 0611 02/02/22 0634  NA 140   < > 143 142 145 145 145  K 4.1   < > 4.4 3.8 3.8 3.4* 3.6  CL 99   < > 99 97* 97* 95* 93*  CO2 34*   < > 37* 38* 43* 43* 40*  GLUCOSE 126*   < > 122* 97 104* 103* 105*  BUN 60*   < > 44* 42* 32* 26* 27*  CREATININE 3.09*   < > 2.47* 2.26* 1.87* 1.68* 1.57*  CALCIUM 8.7*   < > 9.1 9.1 9.1 8.8* 9.3  MG 2.4  --  2.4  --  2.1 2.0 2.0  PHOS 5.6*  --   --   --   --   --   --    < > = values in this interval not displayed.   GFR: Estimated Creatinine Clearance: 48.2 mL/min (A) (by C-G formula based on SCr of 1.57 mg/dL (H)). Liver Function Tests: Recent Labs  Lab 01/28/22 1603 01/29/22 0404  AST 25 23  ALT 21 21  ALKPHOS 72 63  BILITOT 2.1* 1.4*  PROT 5.6* 5.4*  ALBUMIN 2.8* 2.8*   No results for input(s): LIPASE, AMYLASE in the last 168 hours. No results for input(s): AMMONIA in the last 168 hours. Coagulation Profile: No results for input(s): INR, PROTIME in the last 168 hours. Cardiac Enzymes: No results for input(s): CKTOTAL, CKMB, CKMBINDEX, TROPONINI in the last 168 hours. BNP (last 3 results) No results for input(s): PROBNP in the last 8760 hours. HbA1C: No results for input(s): HGBA1C in the last 72 hours. CBG: Recent Labs  Lab 01/28/22 2038  GLUCAP 92   Lipid Profile: No results for input(s): CHOL, HDL, LDLCALC, TRIG, CHOLHDL, LDLDIRECT in the last 72 hours. Thyroid Function Tests: No results for input(s): TSH, T4TOTAL, FREET4, T3FREE, THYROIDAB in the last 72 hours. Anemia Panel: No results for input(s): VITAMINB12, FOLATE, FERRITIN, TIBC, IRON, RETICCTPCT in the last 72 hours. Sepsis Labs: Recent Labs  Lab 01/28/22 2200 01/29/22 0005  LATICACIDVEN 1.2 1.7    Recent Results (from the past 240 hour(s))  Resp Panel by RT-PCR (Flu A&B, Covid) Nasopharyngeal Swab     Status: None   Collection  Time: 01/28/22  7:08 PM   Specimen: Nasopharyngeal Swab; Nasopharyngeal(NP) swabs in vial transport medium  Result Value Ref Range Status   SARS Coronavirus 2 by RT PCR NEGATIVE NEGATIVE Final    Comment: (NOTE) SARS-CoV-2 target nucleic acids are NOT DETECTED.  The SARS-CoV-2 RNA is generally detectable in upper respiratory specimens during the acute phase of infection. The lowest concentration of SARS-CoV-2 viral copies this assay can detect is 138 copies/mL. A negative result does not preclude SARS-Cov-2 infection and should not be used as the sole basis for treatment or other patient management decisions.  A negative result may occur with  improper specimen collection/handling, submission of specimen other than nasopharyngeal swab, presence of viral mutation(s) within the areas targeted by this assay, and inadequate number of viral copies(<138 copies/mL). A negative result must be combined with clinical observations, patient history, and epidemiological information. The expected result is Negative.  Fact Sheet for Patients:  EntrepreneurPulse.com.au  Fact Sheet for Healthcare Providers:  IncredibleEmployment.be  This test is no t yet approved or cleared by the [place] FDA and  has been authorized for detection and/or diagnosis of SARS-CoV-2 by FDA under an Emergency Use Authorization (EUA). This EUA will remain  in effect (meaning this test can be used) for the duration of the COVID-19 declaration under Section 564(b)(1) of the Act, 21 U.S.C.section 360bbb-3(b)(1), unless the authorization is terminated  or revoked sooner.       Influenza A by PCR NEGATIVE NEGATIVE Final   Influenza B by PCR NEGATIVE NEGATIVE Final    Comment: (NOTE) The Xpert Xpress SARS-CoV-2/FLU/RSV plus assay is intended as an aid in the diagnosis of influenza from Nasopharyngeal swab specimens and should not be used as a sole basis for treatment. Nasal washings  and aspirates are unacceptable for Xpert Xpress SARS-CoV-2/FLU/RSV testing.  Fact Sheet for Patients: EntrepreneurPulse.com.au  Fact Sheet for Healthcare Providers: IncredibleEmployment.be  This test is not yet approved or cleared by the [place] FDA and has been authorized for detection and/or diagnosis of SARS-CoV-2 by FDA under an Emergency Use Authorization (EUA). This EUA will remain in effect (meaning this test can be used) for the duration of the COVID-19 declaration under Section 564(b)(1) of the Act, 21 U.S.C. section 360bbb-3(b)(1), unless the authorization is terminated or revoked.  Performed at [facility], [address] [place], [place] [address]   MRSA Next Gen by PCR, Nasal     Status: None   Collection Time: 01/28/22  8:47 PM   Specimen: Nasal Mucosa; Nasal Swab  Result Value Ref Range Status   MRSA by PCR Next Gen NOT DETECTED NOT DETECTED Final    Comment: (NOTE) The GeneXpert MRSA Assay (FDA approved for NASAL specimens only), is one component of a comprehensive MRSA colonization surveillance program. It is not intended to diagnose MRSA infection nor to guide or monitor treatment for MRSA infections. Test performance is not FDA approved in patients less than 34 years old. Performed at [facility], [address] [place],  [address]       Radiology Studies: No results found.   Scheduled Meds:  Chlorhexidine Gluconate Cloth  6 each Topical Q0600   heparin  5,000 Units Subcutaneous Q8H   mouth rinse  15 mL Mouth Rinse BID   midodrine  5 mg Oral TID WC   sodium chloride flush  3 mL Intravenous Q12H   Continuous Infusions:  sodium chloride Stopped (02/02/22 0152)   albumin human 12.5 g (02/02/22 0944)   furosemide (LASIX) 200 mg in dextrose 5% 100 mL (2mg /mL) infusion 8 mg/hr (02/02/22 0345)     LOS: 5 days     Enzo Bi, MD [facility] If 7PM-7AM, please contact  night-coverage 02/02/2022, 6:07 PM

## 2022-02-02 NOTE — Progress Notes (Signed)
[place], [place] 02/02/22  Subjective:   Hospital day # 5  Patient known to our practice from outpatient follow-up.  He follows up for chronic kidney disease stage III.  His baseline creatinine fluctuates between 1.5-1.7. He was scheduled to get IV furosemide in same-day surgery but could not receive it because of low blood pressure.    Patient seen sitting comfortably in the chair.  On oxygen via nasal cannula 2 L.  Still having lower extremity edema.   Creatinine slightly improved with latest creatinine 1.57.    Renal: 02/17 0701 - 02/18 0700 In: 695.4 [P.O.:480; I.V.:107.1; IV Piggyback:108.3] Out: 1900 [Urine:1900] Lab Results  Component Value Date   CREATININE 1.57 (H) 02/02/2022   CREATININE 1.68 (H) 02/01/2022   CREATININE 1.87 (H) 01/31/2022     Objective:  Vital signs in last 24 hours:  Temp:  [97.7 F (36.5 C)-98.4 F (36.9 C)] 98.4 F (36.9 C) (02/18 1128) Pulse Rate:  [72-104] 101 (02/18 1128) Resp:  [17-18] 18 (02/18 1128) BP: (99-122)/(63-93) 102/63 (02/18 1128) SpO2:  [91 %-100 %] 96 % (02/18 1128) Weight:  [103.4 kg] 103.4 kg (02/18 0545)  Weight change: -1.1 kg Filed Weights   01/31/22 0425 02/01/22 0500 02/02/22 0545  Weight: 107 kg 104.5 kg 103.4 kg    Intake/Output:    Intake/Output Summary (Last 24 hours) at 02/02/2022 1517 Last data filed at 02/02/2022 0900 Gross per 24 hour  Intake 215.37 ml  Output 2700 ml  Net -2484.63 ml      Physical Exam: General: No acute distress, sitting at bedside  HEENT Moist oral mucous membranes  Pulm/lungs Bilateral pulmonary crackles  CVS/Heart regular  Abdomen:  Soft, nontender  Extremities: Massive lower extremity edema bilaterally  Neurologic: Alert, able to answer few simple questions  Skin: Warm, dry          Basic Metabolic Panel:  Recent Labs  Lab 01/29/22 0404 01/29/22 1812 01/30/22 0451 01/30/22 1109 01/31/22 0422 02/01/22 0611 02/02/22 0634   NA 140   < > 143 142 145 145 145  K 4.1   < > 4.4 3.8 3.8 3.4* 3.6  CL 99   < > 99 97* 97* 95* 93*  CO2 34*   < > 37* 38* 43* 43* 40*  GLUCOSE 126*   < > 122* 97 104* 103* 105*  BUN 60*   < > 44* 42* 32* 26* 27*  CREATININE 3.09*   < > 2.47* 2.26* 1.87* 1.68* 1.57*  CALCIUM 8.7*   < > 9.1 9.1 9.1 8.8* 9.3  MG 2.4  --  2.4  --  2.1 2.0 2.0  PHOS 5.6*  --   --   --   --   --   --    < > = values in this interval not displayed.      CBC: Recent Labs  Lab 01/28/22 1603 01/28/22 2200 01/29/22 0404 01/31/22 0422 02/01/22 0611 02/02/22 0634  WBC 11.5* 10.2 9.2 10.4 9.0 10.4  NEUTROABS 9.4*  --   --   --   --   --   HGB 17.0 17.5* 16.3 16.8 15.4 16.8  HCT 54.4* 56.7* 54.6* 57.1* 51.2 55.3*  MCV 102.1* 104.4* 105.6* 104.8* 103.6* 103.6*  PLT 141* 144* 139* 108* 101* 105*      No results found for: HEPBSAG, HEPBSAB, HEPBIGM    Microbiology:  Recent Results (from the past 240 hour(s))  Resp Panel by RT-PCR (Flu A&B, Covid) Nasopharyngeal Swab  Status: None   Collection Time: 01/28/22  7:08 PM   Specimen: Nasopharyngeal Swab; Nasopharyngeal(NP) swabs in vial transport medium  Result Value Ref Range Status   SARS Coronavirus 2 by RT PCR NEGATIVE NEGATIVE Final    Comment: (NOTE) SARS-CoV-2 target nucleic acids are NOT DETECTED.  The SARS-CoV-2 RNA is generally detectable in upper respiratory specimens during the acute phase of infection. The lowest concentration of SARS-CoV-2 viral copies this assay can detect is 138 copies/mL. A negative result does not preclude SARS-Cov-2 infection and should not be used as the sole basis for treatment or other patient management decisions. A negative result may occur with  improper specimen collection/handling, submission of specimen other than nasopharyngeal swab, presence of viral mutation(s) within the areas targeted by this assay, and inadequate number of viral copies(<138 copies/mL). A negative result must be combined  with clinical observations, patient history, and epidemiological information. The expected result is Negative.  Fact Sheet for Patients:  EntrepreneurPulse.com.au  Fact Sheet for Healthcare Providers:  IncredibleEmployment.be  This test is no t yet approved or cleared by the [place] FDA and  has been authorized for detection and/or diagnosis of SARS-CoV-2 by FDA under an Emergency Use Authorization (EUA). This EUA will remain  in effect (meaning this test can be used) for the duration of the COVID-19 declaration under Section 564(b)(1) of the Act, 21 U.S.C.section 360bbb-3(b)(1), unless the authorization is terminated  or revoked sooner.       Influenza A by PCR NEGATIVE NEGATIVE Final   Influenza B by PCR NEGATIVE NEGATIVE Final    Comment: (NOTE) The Xpert Xpress SARS-CoV-2/FLU/RSV plus assay is intended as an aid in the diagnosis of influenza from Nasopharyngeal swab specimens and should not be used as a sole basis for treatment. Nasal washings and aspirates are unacceptable for Xpert Xpress SARS-CoV-2/FLU/RSV testing.  Fact Sheet for Patients: EntrepreneurPulse.com.au  Fact Sheet for Healthcare Providers: IncredibleEmployment.be  This test is not yet approved or cleared by the [place] FDA and has been authorized for detection and/or diagnosis of SARS-CoV-2 by FDA under an Emergency Use Authorization (EUA). This EUA will remain in effect (meaning this test can be used) for the duration of the COVID-19 declaration under Section 564(b)(1) of the Act, 21 U.S.C. section 360bbb-3(b)(1), unless the authorization is terminated or revoked.  Performed at [facility], [address] [place], [place] [address]   MRSA Next Gen by PCR, Nasal     Status: None   Collection Time: 01/28/22  8:47 PM   Specimen: Nasal Mucosa; Nasal Swab  Result Value Ref Range Status   MRSA by PCR Next Gen  NOT DETECTED NOT DETECTED Final    Comment: (NOTE) The GeneXpert MRSA Assay (FDA approved for NASAL specimens only), is one component of a comprehensive MRSA colonization surveillance program. It is not intended to diagnose MRSA infection nor to guide or monitor treatment for MRSA infections. Test performance is not FDA approved in patients less than 77 years old. Performed at [facility], [address] [place], [place] [address]     Coagulation Studies: No results for input(s): LABPROT, INR in the last 72 hours.  Urinalysis: Recent Labs    01/30/22 2331  COLORURINE YELLOW*  LABSPEC 1.008  PHURINE 5.0  GLUCOSEU NEGATIVE  HGBUR NEGATIVE  BILIRUBINUR NEGATIVE  KETONESUR NEGATIVE  PROTEINUR NEGATIVE  NITRITE NEGATIVE  LEUKOCYTESUR NEGATIVE       Imaging: No results found.   Medications:    sodium chloride Stopped (  02/02/22 0152)   albumin human 12.5 g (02/02/22 0944)   furosemide (LASIX) 200 mg in dextrose 5% 100 mL (2mg /mL) infusion 8 mg/hr (02/02/22 0345)    Chlorhexidine Gluconate Cloth  6 each Topical Q0600   heparin  5,000 Units Subcutaneous Q8H   mouth rinse  15 mL Mouth Rinse BID   midodrine  5 mg Oral TID WC   sodium chloride flush  3 mL Intravenous Q12H   sodium chloride, acetaminophen, docusate sodium, ondansetron (ZOFRAN) IV, polyethylene glycol, sodium chloride flush  Assessment/ Plan:  76 y.o. male with chronic kidney disease stage IIIa/B, hypertension, chronic systolic CHF, erythrocytosis requiring therapeutic phlebotomies, AAA, Prostate enlargement, Emphysema, pulmonary hypertension admitted on 01/28/2022 for Acute respiratory failure with hypoxia (HCC) [J96.01]  2D echo-June 22, 2020-LVEF 45 to 50%, normal right ventricular and pulmonary artery systolic pressure 2D echo-January 29, 2022-results show LVEF is normal at 55 to 60% with normal diastolic parameters however right ventricular size is severely enlarged and there is moderately  elevated pulmonary artery systolic pressure and right atrial size is moderately dilated.  #Acute kidney injury #Chronic kidney disease stage IIIb, baseline creatinine 1.76/GFR 40 from November 19, 2021 # Anasarca # hypoalbuminemia # Hypotension #Pulmonary hypertension  Acute kidney injury likely secondary to volume shifts.  Patient has severe pulmonary hypertension as noted in the 2D echo.  He also has severe volume overload.  Plan: Renal function slowly improving with adequate urine output recorded. Latest Cr 1.57.  Continue IV Lasix drip and IV albumin Monitor closely for hypotension. On midodrine Continue supportive care    LOS: [place], NP 2/18/20233:17 PM  [facility] [place], [place]

## 2022-02-02 NOTE — Progress Notes (Signed)
20G IV placed in Left AC. IV Lasix continuous administered. Albumin administered

## 2022-02-03 DIAGNOSIS — J9601 Acute respiratory failure with hypoxia: Secondary | ICD-10-CM | POA: Diagnosis not present

## 2022-02-03 LAB — CBC
HCT: 50.5 % (ref 39.0–52.0)
Hemoglobin: 15.2 g/dL (ref 13.0–17.0)
MCH: 30.9 pg (ref 26.0–34.0)
MCHC: 30.1 g/dL (ref 30.0–36.0)
MCV: 102.6 fL — ABNORMAL HIGH (ref 80.0–100.0)
Platelets: 103 10*3/uL — ABNORMAL LOW (ref 150–400)
RBC: 4.92 MIL/uL (ref 4.22–5.81)
RDW: 14.3 % (ref 11.5–15.5)
WBC: 7.6 10*3/uL (ref 4.0–10.5)
nRBC: 0 % (ref 0.0–0.2)

## 2022-02-03 LAB — BASIC METABOLIC PANEL
Anion gap: 8 (ref 5–15)
BUN: 25 mg/dL — ABNORMAL HIGH (ref 8–23)
CO2: 41 mmol/L — ABNORMAL HIGH (ref 22–32)
Calcium: 8.9 mg/dL (ref 8.9–10.3)
Chloride: 94 mmol/L — ABNORMAL LOW (ref 98–111)
Creatinine, Ser: 1.54 mg/dL — ABNORMAL HIGH (ref 0.61–1.24)
GFR, Estimated: 46 mL/min — ABNORMAL LOW (ref >60)
Glucose, Bld: 104 mg/dL — ABNORMAL HIGH (ref 70–99)
Potassium: 3.6 mmol/L (ref 3.5–5.1)
Sodium: 143 mmol/L (ref 135–145)

## 2022-02-03 LAB — MAGNESIUM: Magnesium: 2.1 mg/dL (ref 1.7–2.4)

## 2022-02-03 NOTE — Progress Notes (Signed)
Physical Therapy Treatment Patient Details Name: John [name] MRN: [id number] DOB: 1946/02/10 Today's Date: 02/03/2022   History of Present Illness 76 yo M with acute resp distress with hypoxia with generalized swelling and anasarca with low BP.  Acute CHF exacerbation with  progressive renal failure with generalized anasarca with progressive cardio-renal syndrome with COPD exacerbation cor pulmonale and pulmonary HTN    PT Comments    In chair, ready for session.  Sats 90% on 2lp at rest.  Increased to 3 LPM increased to 94% to allow for increased gait distances.  He is able to walk x 2 laps in room to door and back with slow steady gait.  L foot pain initially limits but does seem to improve over time.  Sats did decrease to 85% with gait but pt took standing rest break to get back to baseline and then completed another lap.  Overall slow but steady gait.  Denies fatigue or SOB.  Discussed energy conservation and pacing of activities and awareness of breath and how he is feeling.  Voiced understanding.   Recommendations for follow up therapy are one component of a multi-disciplinary discharge planning process, led by the attending physician.  Recommendations may be updated based on patient status, additional functional criteria and insurance authorization.  Follow Up Recommendations  Home health PT     Assistance Recommended at Discharge Intermittent Supervision/Assistance  Patient can return home with the following A little help with walking and/or transfers;Assist for transportation;Assistance with cooking/housework;Help with stairs or ramp for entrance   Equipment Recommendations  Rolling walker (2 wheels)    Recommendations for Other Services       Precautions / Restrictions Precautions Precautions: Fall Restrictions Weight Bearing Restrictions: No     Mobility  Bed Mobility               General bed mobility comments: in recliner before and after     Transfers Overall transfer level: Needs assistance Equipment used: Rolling walker (2 wheels) Transfers: Sit to/from Stand Sit to Stand: Min assist, Min guard                Ambulation/Gait Ambulation/Gait assistance: Min guard Gait Distance (Feet): 50 Feet Assistive device: Rolling walker (2 wheels) Gait Pattern/deviations: Step-through pattern, Decreased step length - right, Decreased step length - left Gait velocity: decreased     General Gait Details: antalgic due to L plantarfascitis pain (per pt) but with distance gait improves   Stairs             Wheelchair Mobility    Modified Rankin (Stroke Patients Only)       Balance Overall balance assessment: Needs assistance Sitting-balance support: No upper extremity supported, Feet supported Sitting balance-Leahy Scale: Good     Standing balance support: Bilateral upper extremity supported Standing balance-Leahy Scale: Fair                              Cognition Arousal/Alertness: Awake/alert Behavior During Therapy: WFL for tasks assessed/performed Overall Cognitive Status: Within Functional Limits for tasks assessed                                          Exercises      General Comments        Pertinent Vitals/Pain Pain Assessment Pain Assessment: Faces Faces Pain Scale: Hurts  little more Pain Location: L foot (h/o plantar fasciitis) Pain Descriptors / Indicators: Tender, Sore Pain Intervention(s): Limited activity within patient's tolerance, Monitored during session    Home Living                          Prior Function            PT Goals (current goals can now be found in the care plan section) Progress towards PT goals: Progressing toward goals    Frequency    Min 2X/week      PT Plan Current plan remains appropriate    Co-evaluation              AM-PAC PT "6 Clicks" Mobility   Outcome Measure  Help needed turning  from your back to your side while in a flat bed without using bedrails?: None Help needed moving from lying on your back to sitting on the side of a flat bed without using bedrails?: None Help needed moving to and from a bed to a chair (including a wheelchair)?: A Little Help needed standing up from a chair using your arms (e.g., wheelchair or bedside chair)?: A Little Help needed to walk in hospital room?: A Little Help needed climbing 3-5 steps with a railing? : A Little 6 Click Score: 20    End of Session Equipment Utilized During Treatment: Gait belt;Oxygen Activity Tolerance: Patient tolerated treatment well Patient left: in chair;with call bell/phone within reach;with chair alarm set Nurse Communication: Mobility status;Precautions;Other (comment) PT Visit Diagnosis: Muscle weakness (generalized) (M62.81);Difficulty in walking, not elsewhere classified (R26.2);Unsteadiness on feet (R26.81)     Time: [id number] PT Time Calculation (min) (ACUTE ONLY): 24 min  Charges:  $Gait Training: 23-37 mins                    Chesley Noon, PTA 02/03/22, 12:17 PM

## 2022-02-03 NOTE — Progress Notes (Signed)
PROGRESS NOTE    John [name]  ELF:[id number] DOB: Dec 13, 1946 DOA: 01/28/2022 PCP: Tracie Harrier, MD  [id number]   Assessment & Plan:   Principal Problem:   Acute respiratory failure with hypoxia ([place]) Active Problems:   Cardiorenal syndrome, stage 1-4 or unspecified chronic kidney disease, with heart failure (HCC)   Acute left systolic heart failure (HCC)   Cor pulmonale, acute (HCC)   Renal failure syndrome   CKD (chronic kidney disease) stage 4, GFR 15-29 ml/min (HCC)   COPD exacerbation (HCC)   Cardiogenic shock (HCC)   John [name] is a 76yo AAM with hx of CHF, CAD, COPD who presented with acute hypoxemic respiratory failure w/generalized swelling and anasarca w/hypotension. Patient presented to outpatient clinic on 2/13 for scheduled lasix therapy but was unable to receive due to hypotension w/systolics in the 58N. Patient then presented to the ED and was admitted by PCCM to start Dopamine infusion to improve blood pressure and started on Lasix gtt. The patient was placed on BIPAP to improve his oxygen saturation.  Weaned off Dopa gtt and BiPAP on 2/14 and transferred to hospitalist service on 2/15.   Acute hypoxemic respiratory failure --being treated for CHF exacerbation --was on BiPAP and weaned down to 2L --Continue supplemental O2 to keep sats between 88-92%, wean as tolerated  Acute on Chronic systolic and diastolic heart failure Mod pulm HTN --Last ECHO 2021: EF 45-50%, no wall motion abnormalities --current Echo with improved LVEF to 55-60%, however, with moderately elevated  pulmonary artery systolic pressure.  --cardiology following Plan: --cont lasix gtt, per nephro --cont midodrine and IV albumin support --GDMT held due to hypotension   COPD  --unclear if having acute exacerbation.  No significant wheezing.  Pt denied coughing.  Pt was not started on steroid or scheduled nebs. --monitor for now   AKI, resolved On CKD 3b baseline creatinine  1.76/GFR 40 from November 19, 2021 --nephro consulted.  Cr improving with lasix gtt. Plan: --cont lasix gtt --avoid hypotension --cont midodrine, low dose iv albumin supplementation   DVT prophylaxis: Heparin SQ Code Status: Full code  Family Communication:  Level of care: Progressive Dispo:   The patient is from: home Anticipated d/c is to: home Anticipated d/c date is: undetermined Patient currently is not medically ready to d/c due to: IV lasix gtt, per nephrology   Subjective and Interval History:  RN reported continued good urine output.  Pt has no new complaints.     Objective: Vitals:   02/03/22 0500 02/03/22 0737 02/03/22 1139 02/03/22 1803  BP:  136/86 115/78 113/81  Pulse:  93 86 92  Resp:  18 18 18   Temp:  97.8 F (36.6 C) 97.8 F (36.6 C) 98.7 F (37.1 C)  TempSrc:      SpO2:  91% 90% 100%  Weight: 102.8 kg     Height: 5\' 10"  (1.778 m)       Intake/Output Summary (Last 24 hours) at 02/03/2022 1913 Last data filed at 02/03/2022 1412 Gross per 24 hour  Intake 120 ml  Output 850 ml  Net -730 ml   Filed Weights   02/02/22 0545 02/02/22 2000 02/03/22 0500  Weight: 103.4 kg 103.4 kg 102.8 kg    Examination:   Constitutional: NAD, sitting in recliner HEENT: conjunctivae and lids normal, EOMI CV: No cyanosis.   RESP: normal respiratory effort, on 2L Extremities: 1+ tense pitting edema in BLE SKIN: warm, dry Neuro: II - XII grossly intact.   Psych: Normal mood and affect.  Data Reviewed: I have personally reviewed following labs and imaging studies  CBC: Recent Labs  Lab 01/28/22 1603 01/28/22 2200 01/29/22 0404 01/31/22 0422 02/01/22 0611 02/02/22 0634 02/03/22 0552  WBC 11.5*   < > 9.2 10.4 9.0 10.4 7.6  NEUTROABS 9.4*  --   --   --   --   --   --   HGB 17.0   < > 16.3 16.8 15.4 16.8 15.2  HCT 54.4*   < > 54.6* 57.1* 51.2 55.3* 50.5  MCV 102.1*   < > 105.6* 104.8* 103.6* 103.6* 102.6*  PLT 141*   < > 139* 108* 101* 105* 103*   < >  = values in this interval not displayed.   Basic Metabolic Panel: Recent Labs  Lab 01/29/22 0404 01/29/22 1812 01/30/22 0451 01/30/22 1109 01/31/22 0422 02/01/22 2585 02/02/22 0634 02/03/22 0552  NA 140   < > 143 142 145 145 145 143  K 4.1   < > 4.4 3.8 3.8 3.4* 3.6 3.6  CL 99   < > 99 97* 97* 95* 93* 94*  CO2 34*   < > 37* 38* 43* 43* 40* 41*  GLUCOSE 126*   < > 122* 97 104* 103* 105* 104*  BUN 60*   < > 44* 42* 32* 26* 27* 25*  CREATININE 3.09*   < > 2.47* 2.26* 1.87* 1.68* 1.57* 1.54*  CALCIUM 8.7*   < > 9.1 9.1 9.1 8.8* 9.3 8.9  MG 2.4  --  2.4  --  2.1 2.0 2.0 2.1  PHOS 5.6*  --   --   --   --   --   --   --    < > = values in this interval not displayed.   GFR: Estimated Creatinine Clearance: 49 mL/min (A) (by C-G formula based on SCr of 1.54 mg/dL (H)). Liver Function Tests: Recent Labs  Lab 01/28/22 1603 01/29/22 0404  AST 25 23  ALT 21 21  ALKPHOS 72 63  BILITOT 2.1* 1.4*  PROT 5.6* 5.4*  ALBUMIN 2.8* 2.8*   No results for input(s): LIPASE, AMYLASE in the last 168 hours. No results for input(s): AMMONIA in the last 168 hours. Coagulation Profile: No results for input(s): INR, PROTIME in the last 168 hours. Cardiac Enzymes: No results for input(s): CKTOTAL, CKMB, CKMBINDEX, TROPONINI in the last 168 hours. BNP (last 3 results) No results for input(s): PROBNP in the last 8760 hours. HbA1C: No results for input(s): HGBA1C in the last 72 hours. CBG: Recent Labs  Lab 01/28/22 2038  GLUCAP 92   Lipid Profile: No results for input(s): CHOL, HDL, LDLCALC, TRIG, CHOLHDL, LDLDIRECT in the last 72 hours. Thyroid Function Tests: No results for input(s): TSH, T4TOTAL, FREET4, T3FREE, THYROIDAB in the last 72 hours. Anemia Panel: No results for input(s): VITAMINB12, FOLATE, FERRITIN, TIBC, IRON, RETICCTPCT in the last 72 hours. Sepsis Labs: Recent Labs  Lab 01/28/22 2200 01/29/22 0005  LATICACIDVEN 1.2 1.7    Recent Results (from the past 240 hour(s))   Resp Panel by RT-PCR (Flu A&B, Covid) Nasopharyngeal Swab     Status: None   Collection Time: 01/28/22  7:08 PM   Specimen: Nasopharyngeal Swab; Nasopharyngeal(NP) swabs in vial transport medium  Result Value Ref Range Status   SARS Coronavirus 2 by RT PCR NEGATIVE NEGATIVE Final    Comment: (NOTE) SARS-CoV-2 target nucleic acids are NOT DETECTED.  The SARS-CoV-2 RNA is generally detectable in upper respiratory specimens during the acute phase of  infection. The lowest concentration of SARS-CoV-2 viral copies this assay can detect is 138 copies/mL. A negative result does not preclude SARS-Cov-2 infection and should not be used as the sole basis for treatment or other patient management decisions. A negative result may occur with  improper specimen collection/handling, submission of specimen other than nasopharyngeal swab, presence of viral mutation(s) within the areas targeted by this assay, and inadequate number of viral copies(<138 copies/mL). A negative result must be combined with clinical observations, patient history, and epidemiological information. The expected result is Negative.  Fact Sheet for Patients:  EntrepreneurPulse.com.au  Fact Sheet for Healthcare Providers:  IncredibleEmployment.be  This test is no t yet approved or cleared by the [place] FDA and  has been authorized for detection and/or diagnosis of SARS-CoV-2 by FDA under an Emergency Use Authorization (EUA). This EUA will remain  in effect (meaning this test can be used) for the duration of the COVID-19 declaration under Section 564(b)(1) of the Act, 21 U.S.C.section 360bbb-3(b)(1), unless the authorization is terminated  or revoked sooner.       Influenza A by PCR NEGATIVE NEGATIVE Final   Influenza B by PCR NEGATIVE NEGATIVE Final    Comment: (NOTE) The Xpert Xpress SARS-CoV-2/FLU/RSV plus assay is intended as an aid in the diagnosis of influenza from  Nasopharyngeal swab specimens and should not be used as a sole basis for treatment. Nasal washings and aspirates are unacceptable for Xpert Xpress SARS-CoV-2/FLU/RSV testing.  Fact Sheet for Patients: EntrepreneurPulse.com.au  Fact Sheet for Healthcare Providers: IncredibleEmployment.be  This test is not yet approved or cleared by the [place] FDA and has been authorized for detection and/or diagnosis of SARS-CoV-2 by FDA under an Emergency Use Authorization (EUA). This EUA will remain in effect (meaning this test can be used) for the duration of the COVID-19 declaration under Section 564(b)(1) of the Act, 21 U.S.C. section 360bbb-3(b)(1), unless the authorization is terminated or revoked.  Performed at [facility], [address] [place], [place] [address]   MRSA Next Gen by PCR, Nasal     Status: None   Collection Time: 01/28/22  8:47 PM   Specimen: Nasal Mucosa; Nasal Swab  Result Value Ref Range Status   MRSA by PCR Next Gen NOT DETECTED NOT DETECTED Final    Comment: (NOTE) The GeneXpert MRSA Assay (FDA approved for NASAL specimens only), is one component of a comprehensive MRSA colonization surveillance program. It is not intended to diagnose MRSA infection nor to guide or monitor treatment for MRSA infections. Test performance is not FDA approved in patients less than 60 years old. Performed at [facility], [address], [place], [place] [address]       Radiology Studies: No results found.   Scheduled Meds:  Chlorhexidine Gluconate Cloth  6 each Topical Q0600   heparin  5,000 Units Subcutaneous Q8H   mouth rinse  15 mL Mouth Rinse BID   midodrine  5 mg Oral TID WC   sodium chloride flush  3 mL Intravenous Q12H   Continuous Infusions:  sodium chloride Stopped (02/02/22 0152)   albumin human 12.5 g (02/03/22 0849)   furosemide (LASIX) 200 mg in dextrose 5% 100 mL (2mg /mL) infusion 8 mg/hr  (02/03/22 0948)     LOS: 6 days     Enzo Bi, MD [facility] If 7PM-7AM, please contact night-coverage 02/03/2022, 7:13 PM

## 2022-02-03 NOTE — Progress Notes (Signed)
[place], [place] 02/03/22  Subjective:   Hospital day # 6  Patient known to our practice from outpatient follow-up.  He follows up for chronic kidney disease stage III.  His baseline creatinine fluctuates between 1.5-1.7. He was scheduled to get IV furosemide in same-day surgery but could not receive it because of low blood pressure.    Patient seen sitting comfortably in the chair.  On oxygen via nasal cannula 2 L. Still having lower extremity edema and continuing with lasix drip.  Creatinine slightly improved with latest creatinine 1.54.    Renal: 02/18 0701 - 02/19 0700 In: -  Out: 2202 [Urine:1650] Lab Results  Component Value Date   CREATININE 1.54 (H) 02/03/2022   CREATININE 1.57 (H) 02/02/2022   CREATININE 1.68 (H) 02/01/2022     Objective:  Vital signs in last 24 hours:  Temp:  [97.8 F (36.6 C)-98.2 F (36.8 C)] 97.8 F (36.6 C) (02/19 1139) Pulse Rate:  [77-93] 86 (02/19 1139) Resp:  [16-18] 18 (02/19 1139) BP: (115-136)/(78-99) 115/78 (02/19 1139) SpO2:  [90 %-99 %] 90 % (02/19 1139) Weight:  [102.8 kg-103.4 kg] 102.8 kg (02/19 0500)  Weight change: 0 kg Filed Weights   02/02/22 0545 02/02/22 2000 02/03/22 0500  Weight: 103.4 kg 103.4 kg 102.8 kg    Intake/Output:    Intake/Output Summary (Last 24 hours) at 02/03/2022 1614 Last data filed at 02/03/2022 1412 Gross per 24 hour  Intake 120 ml  Output 850 ml  Net -730 ml      Physical Exam: General: No acute distress, sitting at bedside  HEENT Moist oral mucous membranes  Pulm/lungs Bilateral pulmonary crackles  CVS/Heart regular  Abdomen:  Soft, nontender  Extremities: Massive lower extremity edema bilaterally  Neurologic: Alert, able to answer few simple questions  Skin: Warm, dry          Basic Metabolic Panel:  Recent Labs  Lab 01/29/22 0404 01/29/22 1812 01/30/22 0451 01/30/22 1109 01/31/22 0422 02/01/22 0611 02/02/22 0634 02/03/22 0552  NA 140    < > 143 142 145 145 145 143  K 4.1   < > 4.4 3.8 3.8 3.4* 3.6 3.6  CL 99   < > 99 97* 97* 95* 93* 94*  CO2 34*   < > 37* 38* 43* 43* 40* 41*  GLUCOSE 126*   < > 122* 97 104* 103* 105* 104*  BUN 60*   < > 44* 42* 32* 26* 27* 25*  CREATININE 3.09*   < > 2.47* 2.26* 1.87* 1.68* 1.57* 1.54*  CALCIUM 8.7*   < > 9.1 9.1 9.1 8.8* 9.3 8.9  MG 2.4  --  2.4  --  2.1 2.0 2.0 2.1  PHOS 5.6*  --   --   --   --   --   --   --    < > = values in this interval not displayed.      CBC: Recent Labs  Lab 01/28/22 1603 01/28/22 2200 01/29/22 0404 01/31/22 0422 02/01/22 0611 02/02/22 0634 02/03/22 0552  WBC 11.5*   < > 9.2 10.4 9.0 10.4 7.6  NEUTROABS 9.4*  --   --   --   --   --   --   HGB 17.0   < > 16.3 16.8 15.4 16.8 15.2  HCT 54.4*   < > 54.6* 57.1* 51.2 55.3* 50.5  MCV 102.1*   < > 105.6* 104.8* 103.6* 103.6* 102.6*  PLT 141*   < >  139* 108* 101* 105* 103*   < > = values in this interval not displayed.      No results found for: HEPBSAG, HEPBSAB, HEPBIGM    Microbiology:  Recent Results (from the past 240 hour(s))  Resp Panel by RT-PCR (Flu A&B, Covid) Nasopharyngeal Swab     Status: None   Collection Time: 01/28/22  7:08 PM   Specimen: Nasopharyngeal Swab; Nasopharyngeal(NP) swabs in vial transport medium  Result Value Ref Range Status   SARS Coronavirus 2 by RT PCR NEGATIVE NEGATIVE Final    Comment: (NOTE) SARS-CoV-2 target nucleic acids are NOT DETECTED.  The SARS-CoV-2 RNA is generally detectable in upper respiratory specimens during the acute phase of infection. The lowest concentration of SARS-CoV-2 viral copies this assay can detect is 138 copies/mL. A negative result does not preclude SARS-Cov-2 infection and should not be used as the sole basis for treatment or other patient management decisions. A negative result may occur with  improper specimen collection/handling, submission of specimen other than nasopharyngeal swab, presence of viral mutation(s) within  the areas targeted by this assay, and inadequate number of viral copies(<138 copies/mL). A negative result must be combined with clinical observations, patient history, and epidemiological information. The expected result is Negative.  Fact Sheet for Patients:  EntrepreneurPulse.com.au  Fact Sheet for Healthcare Providers:  IncredibleEmployment.be  This test is no t yet approved or cleared by the [place] FDA and  has been authorized for detection and/or diagnosis of SARS-CoV-2 by FDA under an Emergency Use Authorization (EUA). This EUA will remain  in effect (meaning this test can be used) for the duration of the COVID-19 declaration under Section 564(b)(1) of the Act, 21 U.S.C.section 360bbb-3(b)(1), unless the authorization is terminated  or revoked sooner.       Influenza A by PCR NEGATIVE NEGATIVE Final   Influenza B by PCR NEGATIVE NEGATIVE Final    Comment: (NOTE) The Xpert Xpress SARS-CoV-2/FLU/RSV plus assay is intended as an aid in the diagnosis of influenza from Nasopharyngeal swab specimens and should not be used as a sole basis for treatment. Nasal washings and aspirates are unacceptable for Xpert Xpress SARS-CoV-2/FLU/RSV testing.  Fact Sheet for Patients: EntrepreneurPulse.com.au  Fact Sheet for Healthcare Providers: IncredibleEmployment.be  This test is not yet approved or cleared by the [place] FDA and has been authorized for detection and/or diagnosis of SARS-CoV-2 by FDA under an Emergency Use Authorization (EUA). This EUA will remain in effect (meaning this test can be used) for the duration of the COVID-19 declaration under Section 564(b)(1) of the Act, 21 U.S.C. section 360bbb-3(b)(1), unless the authorization is terminated or revoked.  Performed at [facility], [address] [place], [place] [address]   MRSA Next Gen by PCR, Nasal     Status: None    Collection Time: 01/28/22  8:47 PM   Specimen: Nasal Mucosa; Nasal Swab  Result Value Ref Range Status   MRSA by PCR Next Gen NOT DETECTED NOT DETECTED Final    Comment: (NOTE) The GeneXpert MRSA Assay (FDA approved for NASAL specimens only), is one component of a comprehensive MRSA colonization surveillance program. It is not intended to diagnose MRSA infection nor to guide or monitor treatment for MRSA infections. Test performance is not FDA approved in patients less than 82 years old. Performed at [facility], [address] [place], [place] [address]     Coagulation Studies: No results for input(s): LABPROT, INR in the last 72 hours.  Urinalysis: No results  for input(s): COLORURINE, LABSPEC, [place], GLUCOSEU, HGBUR, BILIRUBINUR, KETONESUR, PROTEINUR, UROBILINOGEN, NITRITE, LEUKOCYTESUR in the last 72 hours.  Invalid input(s): APPERANCEUR     Imaging: No results found.   Medications:    sodium chloride Stopped (02/02/22 0152)   albumin human 12.5 g (02/03/22 0849)   furosemide (LASIX) 200 mg in dextrose 5% 100 mL (2mg /mL) infusion 8 mg/hr (02/03/22 0948)    Chlorhexidine Gluconate Cloth  6 each Topical Q0600   heparin  5,000 Units Subcutaneous Q8H   mouth rinse  15 mL Mouth Rinse BID   midodrine  5 mg Oral TID WC   sodium chloride flush  3 mL Intravenous Q12H   sodium chloride, acetaminophen, docusate sodium, ondansetron (ZOFRAN) IV, polyethylene glycol, sodium chloride flush  Assessment/ Plan:  76 y.o. male with chronic kidney disease stage IIIa/B, hypertension, chronic systolic CHF, erythrocytosis requiring therapeutic phlebotomies, AAA, Prostate enlargement, Emphysema, pulmonary hypertension admitted on 01/28/2022 for Acute respiratory failure with hypoxia (HCC) [J96.01]  2D echo-June 22, 2020-LVEF 45 to 50%, normal right ventricular and pulmonary artery systolic pressure 2D echo-January 29, 2022-results show LVEF is normal at 55 to 60% with normal  diastolic parameters however right ventricular size is severely enlarged and there is moderately elevated pulmonary artery systolic pressure and right atrial size is moderately dilated.  #Acute kidney injury #Chronic kidney disease stage IIIb, baseline creatinine 1.76/GFR 40 from November 19, 2021 # Anasarca # hypoalbuminemia # Hypotension #Pulmonary hypertension  Acute kidney injury likely secondary to volume shifts.  Patient has severe pulmonary hypertension as noted in the 2D echo.  He also has severe volume overload.  Plan: Renal function slowly improving with adequate urine output recorded. Latest Cr 1.54.  Continue IV Lasix drip and IV albumin Monitor closely for hypotension. On midodrine Continue supportive care.  Will add fluid restriction 1500 ml per day    LOS: [place], NP 2/19/20234:14 PM  [facility] [place], [place]

## 2022-02-04 DIAGNOSIS — J9601 Acute respiratory failure with hypoxia: Secondary | ICD-10-CM | POA: Diagnosis not present

## 2022-02-04 LAB — BASIC METABOLIC PANEL
Anion gap: 9 (ref 5–15)
BUN: 23 mg/dL (ref 8–23)
CO2: 38 mmol/L — ABNORMAL HIGH (ref 22–32)
Calcium: 9.1 mg/dL (ref 8.9–10.3)
Chloride: 95 mmol/L — ABNORMAL LOW (ref 98–111)
Creatinine, Ser: 1.6 mg/dL — ABNORMAL HIGH (ref 0.61–1.24)
GFR, Estimated: 44 mL/min — ABNORMAL LOW (ref >60)
Glucose, Bld: 103 mg/dL — ABNORMAL HIGH (ref 70–99)
Potassium: 3.8 mmol/L (ref 3.5–5.1)
Sodium: 142 mmol/L (ref 135–145)

## 2022-02-04 LAB — CBC
HCT: 51.6 % (ref 39.0–52.0)
Hemoglobin: 15.4 g/dL (ref 13.0–17.0)
MCH: 31 pg (ref 26.0–34.0)
MCHC: 29.8 g/dL — ABNORMAL LOW (ref 30.0–36.0)
MCV: 103.8 fL — ABNORMAL HIGH (ref 80.0–100.0)
Platelets: 97 10*3/uL — ABNORMAL LOW (ref 150–400)
RBC: 4.97 MIL/uL (ref 4.22–5.81)
RDW: 14.1 % (ref 11.5–15.5)
WBC: 7.2 10*3/uL (ref 4.0–10.5)
nRBC: 0 % (ref 0.0–0.2)

## 2022-02-04 LAB — MAGNESIUM: Magnesium: 2.3 mg/dL (ref 1.7–2.4)

## 2022-02-04 MED ORDER — METOLAZONE 5 MG PO TABS
5.0000 mg | ORAL_TABLET | Freq: Once | ORAL | Status: AC
  Administered 2022-02-04: 5 mg via ORAL
  Filled 2022-02-04: qty 1

## 2022-02-04 NOTE — Progress Notes (Signed)
Physical Therapy Treatment Patient Details Name: Romani Wilbon MRN: [id number] DOB: Jun 09, 1946 Today's Date: 02/04/2022   History of Present Illness 76 yo M with acute resp distress with hypoxia with generalized swelling and anasarca with low BP.  Acute CHF exacerbation with  progressive renal failure with generalized anasarca with progressive cardio-renal syndrome with COPD exacerbation cor pulmonale and pulmonary HTN    PT Comments    In chair, ready for session.  Sats 86/87% on 2lpm at rest.  Increased to 3 lpm prior to gait and sats 90%.  He is able to walk x 3 laps in room with RW and supervision.  Slow but steady gait with cues/education regarding O2 cord management and awareness.  Pt with longer standing rest breaks to recover with sats as low as 83% after gait with slow recovery and increased to 4 lpm to allow for a 3rd lap.  Sats recovered to baseline at rest and back down to 2lpm after session.  Pt continues to report not feeling any SOB with activity even when sats go down to low 80's.  Education provided.  Discussed with RN and reading are quite variable during session and pt reports being asymptomatic with SOB.  Overall good activity tolerance not limited by fatigue.  Pt stated he does not feel HHPT is beneficial to hom.  Stated he goes to [facility] several times a week and feels he can manage his own recovery.  Stated he has "been through this before" and knows what to do.  Removed [facility]PT as [facility] and will not qualify for HHPT if active in community.   Recommendations for follow up therapy are one component of a multi-disciplinary discharge planning process, led by the attending physician.  Recommendations may be updated based on patient status, additional functional criteria and insurance authorization.  Follow Up Recommendations  No PT follow up     Assistance Recommended at Discharge Intermittent Supervision/Assistance  Patient can return home with  the following Assistance with cooking/housework;Help with stairs or ramp for entrance   Equipment Recommendations  Rollator (4 wheels)    Recommendations for Other Services       Precautions / Restrictions Precautions Precautions: Fall Restrictions Weight Bearing Restrictions: No     Mobility  Bed Mobility Overal bed mobility: Modified Independent                  Transfers Overall transfer level: Modified independent Equipment used: Rolling walker (2 wheels) Transfers: Sit to/from Stand Sit to Stand: Supervision                Ambulation/Gait Ambulation/Gait assistance: Min guard, Supervision Gait Distance (Feet): 70 Feet Assistive device: Rolling walker (2 wheels) Gait Pattern/deviations: Step-through pattern, Decreased step length - right, Decreased step length - left Gait velocity: decreased     General Gait Details: improved gait today - less :L foot pain   Stairs             Wheelchair Mobility    Modified Rankin (Stroke Patients Only)       Balance Overall balance assessment: Modified Independent Sitting-balance support: Feet supported Sitting balance-Leahy Scale: Good Sitting balance - Comments: steady with walker   Standing balance support: Bilateral upper extremity supported Standing balance-Leahy Scale: Good                              Cognition Arousal/Alertness: Awake/alert Behavior During Therapy: [facility]  for tasks assessed/performed Overall Cognitive Status: Within Functional Limits for tasks assessed                                          Exercises      General Comments        Pertinent Vitals/Pain Pain Assessment Pain Assessment: Faces Faces Pain Scale: Hurts a little bit Pain Location: L foot (h/o plantar fasciitis) Pain Descriptors / Indicators: Tender, Sore Pain Intervention(s): Monitored during session    Home Living                          Prior Function             PT Goals (current goals can now be found in the care plan section) Progress towards PT goals: Progressing toward goals    Frequency    Min 2X/week      PT Plan Discharge plan needs to be updated    Co-evaluation              AM-PAC PT "6 Clicks" Mobility   Outcome Measure  Help needed turning from your back to your side while in a flat bed without using bedrails?: None Help needed moving from lying on your back to sitting on the side of a flat bed without using bedrails?: None Help needed moving to and from a bed to a chair (including a wheelchair)?: None Help needed standing up from a chair using your arms (e.g., wheelchair or bedside chair)?: None Help needed to walk in hospital room?: A Little Help needed climbing 3-5 steps with a railing? : A Little 6 Click Score: 18    End of Session Equipment Utilized During Treatment: Gait belt;Oxygen Activity Tolerance: Patient tolerated treatment well Patient left: in chair;with call bell/phone within reach;with chair alarm set;with nursing/sitter in room Nurse Communication: Mobility status;Precautions;Other (comment) PT Visit Diagnosis: Muscle weakness (generalized) (M62.81);Difficulty in walking, not elsewhere classified (R26.2);Unsteadiness on feet (R26.81)     Time: 8127-5170 PT Time Calculation (min) (ACUTE ONLY): 23 min  Charges:  $Gait Training: 23-37 mins                    Chesley Noon, PTA 02/04/22, 9:23 AM

## 2022-02-04 NOTE — Progress Notes (Signed)
PROGRESS NOTE    John [name]  TIR:[id number] DOB: 03-31-1946 DOA: 01/28/2022 PCP: Tracie Harrier, MD  [id number]   Assessment & Plan:   Principal Problem:   Acute respiratory failure with hypoxia ([place]) Active Problems:   Cardiorenal syndrome, stage 1-4 or unspecified chronic kidney disease, with heart failure (HCC)   Acute left systolic heart failure (HCC)   Cor pulmonale, acute (HCC)   Renal failure syndrome   CKD (chronic kidney disease) stage 4, GFR 15-29 ml/min (HCC)   COPD exacerbation (HCC)   Cardiogenic shock (HCC)   John [name] is a 76yo AAM with hx of CHF, CAD, COPD who presented with acute hypoxemic respiratory failure w/generalized swelling and anasarca w/hypotension. Patient presented to outpatient clinic on 2/13 for scheduled lasix therapy but was unable to receive due to hypotension w/systolics in the 67Y. Patient then presented to the ED and was admitted by PCCM to start Dopamine infusion to improve blood pressure and started on Lasix gtt. The patient was placed on BIPAP to improve his oxygen saturation.  Weaned off Dopa gtt and BiPAP on 2/14 and transferred to hospitalist service on 2/15.   Acute hypoxemic respiratory failure --being treated for CHF exacerbation --was on BiPAP and weaned down to 2L --Continue supplemental O2 to keep sats >=90%, wean as tolerated  Acute on Chronic systolic and diastolic heart failure Mod pulm HTN --Last ECHO 2021: EF 45-50%, no wall motion abnormalities --current Echo with improved LVEF to 55-60%, however, with moderately elevated  pulmonary artery systolic pressure.  --cardiology and nephro following Plan: --need to continue Lasix gtt, per nephro --add metolazone --cont midodrine and IV albumin support --GDMT held due to hypotension   COPD  --unclear if having acute exacerbation.  No significant wheezing.  Pt denied coughing.  Pt was not started on steroid or scheduled nebs. --Not on home  bronchodilators. --Monitor for now   AKI, resolved On CKD 3b baseline creatinine 1.76/GFR 40 from November 19, 2021 --nephro consulted.  Cr improving with lasix gtt. Plan: --need to continue Lasix gtt, per nephro --avoid hypotension --cont midodrine, low dose iv albumin supplementation   DVT prophylaxis: Heparin SQ Code Status: Full code  Family Communication:  Level of care: Progressive Dispo:   The patient is from: home Anticipated d/c is to: home Anticipated d/c date is: undetermined Patient currently is not medically ready to d/c due to: IV lasix gtt, per nephrology   Subjective and Interval History:  Pt said he hasn't been drinking too much fluid.  Urine output stable but not as much as expected, per nephrology, and need to continue diuresis.   Objective: Vitals:   02/04/22 0437 02/04/22 0736 02/04/22 0904 02/04/22 1126  BP: 131/86 108/85  115/76  Pulse: 91 95  87  Resp: 14 18  18   Temp: 98 F (36.7 C) 97.7 F (36.5 C)  98 F (36.7 C)  TempSrc:  Oral    SpO2: 93% 96%  90%  Weight: 103.1 kg  104.1 kg   Height:        Intake/Output Summary (Last 24 hours) at 02/04/2022 1457 Last data filed at 02/04/2022 1300 Gross per 24 hour  Intake 832.91 ml  Output 1650 ml  Net -817.09 ml   Filed Weights   02/03/22 0500 02/04/22 0437 02/04/22 0904  Weight: 102.8 kg 103.1 kg 104.1 kg    Examination:   Constitutional: NAD, AAOx3, sitting in recliner HEENT: conjunctivae and lids normal, EOMI CV: No cyanosis.   RESP: normal respiratory effort, on [place]  Extremities: tense edema in BLE SKIN: warm, dry Neuro: II - XII grossly intact.   Psych: flat mood and affect.     Data Reviewed: I have personally reviewed following labs and imaging studies  CBC: Recent Labs  Lab 01/28/22 1603 01/28/22 2200 01/31/22 0422 02/01/22 0611 02/02/22 0634 02/03/22 0552 02/04/22 0526  WBC 11.5*   < > 10.4 9.0 10.4 7.6 7.2  NEUTROABS 9.4*  --   --   --   --   --   --   HGB 17.0   <  > 16.8 15.4 16.8 15.2 15.4  HCT 54.4*   < > 57.1* 51.2 55.3* 50.5 51.6  MCV 102.1*   < > 104.8* 103.6* 103.6* 102.6* 103.8*  PLT 141*   < > 108* 101* 105* 103* 97*   < > = values in this interval not displayed.   Basic Metabolic Panel: Recent Labs  Lab 01/29/22 0404 01/29/22 1812 01/31/22 0422 02/01/22 9326 02/02/22 0634 02/03/22 0552 02/04/22 0526  NA 140   < > 145 145 145 143 142  K 4.1   < > 3.8 3.4* 3.6 3.6 3.8  CL 99   < > 97* 95* 93* 94* 95*  CO2 34*   < > 43* 43* 40* 41* 38*  GLUCOSE 126*   < > 104* 103* 105* 104* 103*  BUN 60*   < > 32* 26* 27* 25* 23  CREATININE 3.09*   < > 1.87* 1.68* 1.57* 1.54* 1.60*  CALCIUM 8.7*   < > 9.1 8.8* 9.3 8.9 9.1  MG 2.4   < > 2.1 2.0 2.0 2.1 2.3  PHOS 5.6*  --   --   --   --   --   --    < > = values in this interval not displayed.   GFR: Estimated Creatinine Clearance: 47.4 mL/min (A) (by C-G formula based on SCr of 1.6 mg/dL (H)). Liver Function Tests: Recent Labs  Lab 01/28/22 1603 01/29/22 0404  AST 25 23  ALT 21 21  ALKPHOS 72 63  BILITOT 2.1* 1.4*  PROT 5.6* 5.4*  ALBUMIN 2.8* 2.8*   No results for input(s): LIPASE, AMYLASE in the last 168 hours. No results for input(s): AMMONIA in the last 168 hours. Coagulation Profile: No results for input(s): INR, PROTIME in the last 168 hours. Cardiac Enzymes: No results for input(s): CKTOTAL, CKMB, CKMBINDEX, TROPONINI in the last 168 hours. BNP (last 3 results) No results for input(s): PROBNP in the last 8760 hours. HbA1C: No results for input(s): HGBA1C in the last 72 hours. CBG: Recent Labs  Lab 01/28/22 2038  GLUCAP 92   Lipid Profile: No results for input(s): CHOL, HDL, LDLCALC, TRIG, CHOLHDL, LDLDIRECT in the last 72 hours. Thyroid Function Tests: No results for input(s): TSH, T4TOTAL, FREET4, T3FREE, THYROIDAB in the last 72 hours. Anemia Panel: No results for input(s): VITAMINB12, FOLATE, FERRITIN, TIBC, IRON, RETICCTPCT in the last 72 hours. Sepsis  Labs: Recent Labs  Lab 01/28/22 2200 01/29/22 0005  LATICACIDVEN 1.2 1.7    Recent Results (from the past 240 hour(s))  Resp Panel by RT-PCR (Flu A&B, Covid) Nasopharyngeal Swab     Status: None   Collection Time: 01/28/22  7:08 PM   Specimen: Nasopharyngeal Swab; Nasopharyngeal(NP) swabs in vial transport medium  Result Value Ref Range Status   SARS Coronavirus 2 by RT PCR NEGATIVE NEGATIVE Final    Comment: (NOTE) SARS-CoV-2 target nucleic acids are NOT DETECTED.  The SARS-CoV-2 RNA is generally  detectable in upper respiratory specimens during the acute phase of infection. The lowest concentration of SARS-CoV-2 viral copies this assay can detect is 138 copies/mL. A negative result does not preclude SARS-Cov-2 infection and should not be used as the sole basis for treatment or other patient management decisions. A negative result may occur with  improper specimen collection/handling, submission of specimen other than nasopharyngeal swab, presence of viral mutation(s) within the areas targeted by this assay, and inadequate number of viral copies(<138 copies/mL). A negative result must be combined with clinical observations, patient history, and epidemiological information. The expected result is Negative.  Fact Sheet for Patients:  EntrepreneurPulse.com.au  Fact Sheet for Healthcare Providers:  IncredibleEmployment.be  This test is no t yet approved or cleared by the [place] FDA and  has been authorized for detection and/or diagnosis of SARS-CoV-2 by FDA under an Emergency Use Authorization (EUA). This EUA will remain  in effect (meaning this test can be used) for the duration of the COVID-19 declaration under Section 564(b)(1) of the Act, 21 U.S.C.section 360bbb-3(b)(1), unless the authorization is terminated  or revoked sooner.       Influenza A by PCR NEGATIVE NEGATIVE Final   Influenza B by PCR NEGATIVE NEGATIVE Final     Comment: (NOTE) The Xpert Xpress SARS-CoV-2/FLU/RSV plus assay is intended as an aid in the diagnosis of influenza from Nasopharyngeal swab specimens and should not be used as a sole basis for treatment. Nasal washings and aspirates are unacceptable for Xpert Xpress SARS-CoV-2/FLU/RSV testing.  Fact Sheet for Patients: EntrepreneurPulse.com.au  Fact Sheet for Healthcare Providers: IncredibleEmployment.be  This test is not yet approved or cleared by the [place] FDA and has been authorized for detection and/or diagnosis of SARS-CoV-2 by FDA under an Emergency Use Authorization (EUA). This EUA will remain in effect (meaning this test can be used) for the duration of the COVID-19 declaration under Section 564(b)(1) of the Act, 21 U.S.C. section 360bbb-3(b)(1), unless the authorization is terminated or revoked.  Performed at [facility], [address] [place], [place] [address]   MRSA Next Gen by PCR, Nasal     Status: None   Collection Time: 01/28/22  8:47 PM   Specimen: Nasal Mucosa; Nasal Swab  Result Value Ref Range Status   MRSA by PCR Next Gen NOT DETECTED NOT DETECTED Final    Comment: (NOTE) The GeneXpert MRSA Assay (FDA approved for NASAL specimens only), is one component of a comprehensive MRSA colonization surveillance program. It is not intended to diagnose MRSA infection nor to guide or monitor treatment for MRSA infections. Test performance is not FDA approved in patients less than 81 years old. Performed at [facility], [address] [place], [place] [address]       Radiology Studies: No results found.   Scheduled Meds:  Chlorhexidine Gluconate Cloth  6 each Topical Q0600   heparin  5,000 Units Subcutaneous Q8H   mouth rinse  15 mL Mouth Rinse BID   midodrine  5 mg Oral TID WC   sodium chloride flush  3 mL Intravenous Q12H   Continuous Infusions:  sodium chloride Stopped (02/02/22 0152)    albumin human 12.5 g (02/04/22 0906)   furosemide (LASIX) 200 mg in dextrose 5% 100 mL (2mg /mL) infusion 8 mg/hr (02/04/22 0641)     LOS: 7 days     Enzo Bi, MD [facility] If 7PM-7AM, please contact night-coverage 02/04/2022, 2:57 PM

## 2022-02-04 NOTE — Progress Notes (Signed)
[place], [place] 02/04/22  Subjective:   Hospital day # 7  Patient known to our practice from outpatient follow-up.  He follows up for chronic kidney disease stage III.  His baseline creatinine fluctuates between 1.5-1.7. He was scheduled to get IV furosemide in same-day surgery but could not receive it because of low blood pressure.   Patient seen sitting up in chair, alert and oriented Tolerating meals Denies nausea, vomiting, and diarrhea. Denies shortness of breath.   Renal: 02/19 0701 - 02/20 0700 In: 232.9 [P.O.:120; IV Piggyback:112.9] Out: 1650 [Urine:1650] Lab Results  Component Value Date   CREATININE 1.60 (H) 02/04/2022   CREATININE 1.54 (H) 02/03/2022   CREATININE 1.57 (H) 02/02/2022  Creatinine stable Urine output 1.65 L in past 24 hours. Remains on furosemide drip   Objective:  Vital signs in last 24 hours:  Temp:  [97.7 F (36.5 C)-98.7 F (37.1 C)] 98 F (36.7 C) (02/20 1126) Pulse Rate:  [83-95] 87 (02/20 1126) Resp:  [13-18] 18 (02/20 1126) BP: (108-131)/(76-91) 115/76 (02/20 1126) SpO2:  [90 %-100 %] 90 % (02/20 1126) Weight:  [103.1 kg-104.1 kg] 104.1 kg (02/20 0904)  Weight change: -0.3 kg Filed Weights   02/03/22 0500 02/04/22 0437 02/04/22 0904  Weight: 102.8 kg 103.1 kg 104.1 kg    Intake/Output:    Intake/Output Summary (Last 24 hours) at 02/04/2022 1526 Last data filed at 02/04/2022 1300 Gross per 24 hour  Intake 832.91 ml  Output 1650 ml  Net -817.09 ml      Physical Exam: General: No acute distress, sitting at bedside  HEENT Moist oral mucous membranes  Pulm/lungs Bilateral pulmonary crackles  CVS/Heart regular  Abdomen:  Soft, nontender  Extremities: Massive lower extremity edema bilaterally  Neurologic: Alert, able to answer few simple questions  Skin: Warm, dry          Basic Metabolic Panel:  Recent Labs  Lab 01/29/22 0404 01/29/22 1812 01/31/22 0422 02/01/22 0611  02/02/22 0634 02/03/22 0552 02/04/22 0526  NA 140   < > 145 145 145 143 142  K 4.1   < > 3.8 3.4* 3.6 3.6 3.8  CL 99   < > 97* 95* 93* 94* 95*  CO2 34*   < > 43* 43* 40* 41* 38*  GLUCOSE 126*   < > 104* 103* 105* 104* 103*  BUN 60*   < > 32* 26* 27* 25* 23  CREATININE 3.09*   < > 1.87* 1.68* 1.57* 1.54* 1.60*  CALCIUM 8.7*   < > 9.1 8.8* 9.3 8.9 9.1  MG 2.4   < > 2.1 2.0 2.0 2.1 2.3  PHOS 5.6*  --   --   --   --   --   --    < > = values in this interval not displayed.      CBC: Recent Labs  Lab 01/28/22 1603 01/28/22 2200 01/31/22 0422 02/01/22 0611 02/02/22 0634 02/03/22 0552 02/04/22 0526  WBC 11.5*   < > 10.4 9.0 10.4 7.6 7.2  NEUTROABS 9.4*  --   --   --   --   --   --   HGB 17.0   < > 16.8 15.4 16.8 15.2 15.4  HCT 54.4*   < > 57.1* 51.2 55.3* 50.5 51.6  MCV 102.1*   < > 104.8* 103.6* 103.6* 102.6* 103.8*  PLT 141*   < > 108* 101* 105* 103* 97*   < > = values in this interval not  displayed.      No results found for: HEPBSAG, HEPBSAB, HEPBIGM    Microbiology:  Recent Results (from the past 240 hour(s))  Resp Panel by RT-PCR (Flu A&B, Covid) Nasopharyngeal Swab     Status: None   Collection Time: 01/28/22  7:08 PM   Specimen: Nasopharyngeal Swab; Nasopharyngeal(NP) swabs in vial transport medium  Result Value Ref Range Status   SARS Coronavirus 2 by RT PCR NEGATIVE NEGATIVE Final    Comment: (NOTE) SARS-CoV-2 target nucleic acids are NOT DETECTED.  The SARS-CoV-2 RNA is generally detectable in upper respiratory specimens during the acute phase of infection. The lowest concentration of SARS-CoV-2 viral copies this assay can detect is 138 copies/mL. A negative result does not preclude SARS-Cov-2 infection and should not be used as the sole basis for treatment or other patient management decisions. A negative result may occur with  improper specimen collection/handling, submission of specimen other than nasopharyngeal swab, presence of viral mutation(s)  within the areas targeted by this assay, and inadequate number of viral copies(<138 copies/mL). A negative result must be combined with clinical observations, patient history, and epidemiological information. The expected result is Negative.  Fact Sheet for Patients:  EntrepreneurPulse.com.au  Fact Sheet for Healthcare Providers:  IncredibleEmployment.be  This test is no t yet approved or cleared by the [place] FDA and  has been authorized for detection and/or diagnosis of SARS-CoV-2 by FDA under an Emergency Use Authorization (EUA). This EUA will remain  in effect (meaning this test can be used) for the duration of the COVID-19 declaration under Section 564(b)(1) of the Act, 21 U.S.C.section 360bbb-3(b)(1), unless the authorization is terminated  or revoked sooner.       Influenza A by PCR NEGATIVE NEGATIVE Final   Influenza B by PCR NEGATIVE NEGATIVE Final    Comment: (NOTE) The Xpert Xpress SARS-CoV-2/FLU/RSV plus assay is intended as an aid in the diagnosis of influenza from Nasopharyngeal swab specimens and should not be used as a sole basis for treatment. Nasal washings and aspirates are unacceptable for Xpert Xpress SARS-CoV-2/FLU/RSV testing.  Fact Sheet for Patients: EntrepreneurPulse.com.au  Fact Sheet for Healthcare Providers: IncredibleEmployment.be  This test is not yet approved or cleared by the [place] FDA and has been authorized for detection and/or diagnosis of SARS-CoV-2 by FDA under an Emergency Use Authorization (EUA). This EUA will remain in effect (meaning this test can be used) for the duration of the COVID-19 declaration under Section 564(b)(1) of the Act, 21 U.S.C. section 360bbb-3(b)(1), unless the authorization is terminated or revoked.  Performed at [facility], [address] [place], [place] [address]   MRSA Next Gen by PCR, Nasal     Status: None    Collection Time: 01/28/22  8:47 PM   Specimen: Nasal Mucosa; Nasal Swab  Result Value Ref Range Status   MRSA by PCR Next Gen NOT DETECTED NOT DETECTED Final    Comment: (NOTE) The GeneXpert MRSA Assay (FDA approved for NASAL specimens only), is one component of a comprehensive MRSA colonization surveillance program. It is not intended to diagnose MRSA infection nor to guide or monitor treatment for MRSA infections. Test performance is not FDA approved in patients less than 25 years old. Performed at [facility], [address] [place], [place] [address]     Coagulation Studies: No results for input(s): LABPROT, INR in the last 72 hours.  Urinalysis: No results for input(s): COLORURINE, LABSPEC, PHURINE, GLUCOSEU, HGBUR, BILIRUBINUR, KETONESUR, PROTEINUR, UROBILINOGEN, NITRITE, LEUKOCYTESUR in the  last 72 hours.  Invalid input(s): APPERANCEUR     Imaging: No results found.   Medications:    sodium chloride Stopped (02/02/22 0152)   albumin human 12.5 g (02/04/22 0906)   furosemide (LASIX) 200 mg in dextrose 5% 100 mL (2mg /mL) infusion 8 mg/hr (02/04/22 0641)    Chlorhexidine Gluconate Cloth  6 each Topical Q0600   heparin  5,000 Units Subcutaneous Q8H   mouth rinse  15 mL Mouth Rinse BID   midodrine  5 mg Oral TID WC   sodium chloride flush  3 mL Intravenous Q12H   sodium chloride, acetaminophen, docusate sodium, ondansetron (ZOFRAN) IV, polyethylene glycol, sodium chloride flush  Assessment/ Plan:  76 y.o. male with chronic kidney disease stage IIIa/B, hypertension, chronic systolic CHF, erythrocytosis requiring therapeutic phlebotomies, AAA, Prostate enlargement, Emphysema, pulmonary hypertension admitted on 01/28/2022 for Acute respiratory failure with hypoxia (HCC) [J96.01]  2D echo-June 22, 2020-LVEF 45 to 50%, normal right ventricular and pulmonary artery systolic pressure 2D echo-January 29, 2022-results show LVEF is normal at 55 to 60% with normal  diastolic parameters however right ventricular size is severely enlarged and there is moderately elevated pulmonary artery systolic pressure and right atrial size is moderately dilated.  #Acute kidney injury #Chronic kidney disease stage IIIb, baseline creatinine 1.76/GFR 40 from November 19, 2021 # Anasarca # hypoalbuminemia # Hypotension #Pulmonary hypertension  Acute kidney injury likely secondary to volume shifts.  Patient has severe pulmonary hypertension as noted in the 2D echo.  He also has severe volume overload.  Plan: Creatinine currently stable with adequate urine output recorded.  Continue IV Lasix drip and IV albumin.  Will order metolazone 5 mg p.o. once for added diuresis.  Continue midodrine to avoid hypotension.  Reeducated patient on fluid restriction and low-sodium diet.   LOS: [place], NP 2/20/20233:26 [place], [place]

## 2022-02-05 ENCOUNTER — Ambulatory Visit: Payer: Medicare HMO | Admitting: Family

## 2022-02-05 DIAGNOSIS — J9601 Acute respiratory failure with hypoxia: Secondary | ICD-10-CM | POA: Diagnosis not present

## 2022-02-05 LAB — CBC
HCT: 50.9 % (ref 39.0–52.0)
Hemoglobin: 15.5 g/dL (ref 13.0–17.0)
MCH: 31.4 pg (ref 26.0–34.0)
MCHC: 30.5 g/dL (ref 30.0–36.0)
MCV: 103 fL — ABNORMAL HIGH (ref 80.0–100.0)
Platelets: 101 10*3/uL — ABNORMAL LOW (ref 150–400)
RBC: 4.94 MIL/uL (ref 4.22–5.81)
RDW: 13.8 % (ref 11.5–15.5)
WBC: 7.5 10*3/uL (ref 4.0–10.5)
nRBC: 0 % (ref 0.0–0.2)

## 2022-02-05 LAB — BASIC METABOLIC PANEL
Anion gap: 11 (ref 5–15)
BUN: 25 mg/dL — ABNORMAL HIGH (ref 8–23)
CO2: 42 mmol/L — ABNORMAL HIGH (ref 22–32)
Calcium: 9.7 mg/dL (ref 8.9–10.3)
Chloride: 89 mmol/L — ABNORMAL LOW (ref 98–111)
Creatinine, Ser: 1.57 mg/dL — ABNORMAL HIGH (ref 0.61–1.24)
GFR, Estimated: 45 mL/min — ABNORMAL LOW (ref >60)
Glucose, Bld: 123 mg/dL — ABNORMAL HIGH (ref 70–99)
Potassium: 3.9 mmol/L (ref 3.5–5.1)
Sodium: 142 mmol/L (ref 135–145)

## 2022-02-05 LAB — MAGNESIUM: Magnesium: 2.1 mg/dL (ref 1.7–2.4)

## 2022-02-05 NOTE — Progress Notes (Addendum)
[place], [place] 02/05/22  Subjective:   Hospital day # 8  Patient known to our practice from outpatient follow-up.  He follows up for chronic kidney disease stage III.  His baseline creatinine fluctuates between 1.5-1.7. He was scheduled to get IV furosemide in same-day surgery but could not receive it because of low blood pressure.   Patient currently resting quietly in bed Lower extremities elevated States he feels almost back to baseline Reports baseline oxygen requirement room air however currently receiving 4 L [place] Denies nausea and vomiting   Renal: 02/20 0701 - 02/21 0700 In: 723 [P.O.:720; I.V.:3] Out: 3250 [Urine:3250] Lab Results  Component Value Date   CREATININE 1.57 (H) 02/05/2022   CREATININE 1.60 (H) 02/04/2022   CREATININE 1.54 (H) 02/03/2022  Creatinine remains stable Urine output 3 L in past 24 hours. Remains on furosemide drip   Objective:  Vital signs in last 24 hours:  Temp:  [97.9 F (36.6 C)-98.6 F (37 C)] 98.3 F (36.8 C) (02/21 1106) Pulse Rate:  [85-99] 99 (02/21 1106) Resp:  [16-20] 20 (02/21 1106) BP: (104-136)/(77-97) 113/78 (02/21 1106) SpO2:  [91 %-99 %] 93 % (02/21 1106) Weight:  [100.1 kg] 100.1 kg (02/21 0503)  Weight change: 0.955 kg Filed Weights   02/04/22 0437 02/04/22 0904 02/05/22 0503  Weight: 103.1 kg 104.1 kg 100.1 kg    Intake/Output:    Intake/Output Summary (Last 24 hours) at 02/05/2022 1433 Last data filed at 02/05/2022 1359 Gross per 24 hour  Intake 3 ml  Output 5750 ml  Net -5747 ml      Physical Exam: General: No acute distress  HEENT Moist oral mucous membranes  Pulm/lungs Clear to auscultation, normal effort, [place] O2  CVS/Heart regular  Abdomen:  Soft, nontender  Extremities: 2+ lower extremity edema bilaterally  Neurologic: Alert, able to answer few simple questions  Skin: Warm, dry          Basic Metabolic Panel:  Recent Labs  Lab 02/01/22 0611  02/02/22 0634 02/03/22 0552 02/04/22 0526 02/05/22 0502  NA 145 145 143 142 142  K 3.4* 3.6 3.6 3.8 3.9  CL 95* 93* 94* 95* 89*  CO2 43* 40* 41* 38* 42*  GLUCOSE 103* 105* 104* 103* 123*  BUN 26* 27* 25* 23 25*  CREATININE 1.68* 1.57* 1.54* 1.60* 1.57*  CALCIUM 8.8* 9.3 8.9 9.1 9.7  MG 2.0 2.0 2.1 2.3 2.1      CBC: Recent Labs  Lab 02/01/22 0611 02/02/22 0634 02/03/22 0552 02/04/22 0526 02/05/22 0502  WBC 9.0 10.4 7.6 7.2 7.5  HGB 15.4 16.8 15.2 15.4 15.5  HCT 51.2 55.3* 50.5 51.6 50.9  MCV 103.6* 103.6* 102.6* 103.8* 103.0*  PLT 101* 105* 103* 97* 101*      No results found for: HEPBSAG, HEPBSAB, HEPBIGM    Microbiology:  Recent Results (from the past 240 hour(s))  Resp Panel by RT-PCR (Flu A&B, Covid) Nasopharyngeal Swab     Status: None   Collection Time: 01/28/22  7:08 PM   Specimen: Nasopharyngeal Swab; Nasopharyngeal(NP) swabs in vial transport medium  Result Value Ref Range Status   SARS Coronavirus 2 by RT PCR NEGATIVE NEGATIVE Final    Comment: (NOTE) SARS-CoV-2 target nucleic acids are NOT DETECTED.  The SARS-CoV-2 RNA is generally detectable in upper respiratory specimens during the acute phase of infection. The lowest concentration of SARS-CoV-2 viral copies this assay can detect is 138 copies/mL. A negative result does not preclude SARS-Cov-2 infection and should  not be used as the sole basis for treatment or other patient management decisions. A negative result may occur with  improper specimen collection/handling, submission of specimen other than nasopharyngeal swab, presence of viral mutation(s) within the areas targeted by this assay, and inadequate number of viral copies(<138 copies/mL). A negative result must be combined with clinical observations, patient history, and epidemiological information. The expected result is Negative.  Fact Sheet for Patients:  EntrepreneurPulse.com.au  Fact Sheet for Healthcare  Providers:  IncredibleEmployment.be  This test is no t yet approved or cleared by the [place] FDA and  has been authorized for detection and/or diagnosis of SARS-CoV-2 by FDA under an Emergency Use Authorization (EUA). This EUA will remain  in effect (meaning this test can be used) for the duration of the COVID-19 declaration under Section 564(b)(1) of the Act, 21 U.S.C.section 360bbb-3(b)(1), unless the authorization is terminated  or revoked sooner.       Influenza A by PCR NEGATIVE NEGATIVE Final   Influenza B by PCR NEGATIVE NEGATIVE Final    Comment: (NOTE) The Xpert Xpress SARS-CoV-2/FLU/RSV plus assay is intended as an aid in the diagnosis of influenza from Nasopharyngeal swab specimens and should not be used as a sole basis for treatment. Nasal washings and aspirates are unacceptable for Xpert Xpress SARS-CoV-2/FLU/RSV testing.  Fact Sheet for Patients: EntrepreneurPulse.com.au  Fact Sheet for Healthcare Providers: IncredibleEmployment.be  This test is not yet approved or cleared by the [place] FDA and has been authorized for detection and/or diagnosis of SARS-CoV-2 by FDA under an Emergency Use Authorization (EUA). This EUA will remain in effect (meaning this test can be used) for the duration of the COVID-19 declaration under Section 564(b)(1) of the Act, 21 U.S.C. section 360bbb-3(b)(1), unless the authorization is terminated or revoked.  Performed at [facility], [address] [place], [place] [address]   MRSA Next Gen by PCR, Nasal     Status: None   Collection Time: 01/28/22  8:47 PM   Specimen: Nasal Mucosa; Nasal Swab  Result Value Ref Range Status   MRSA by PCR Next Gen NOT DETECTED NOT DETECTED Final    Comment: (NOTE) The GeneXpert MRSA Assay (FDA approved for NASAL specimens only), is one component of a comprehensive MRSA colonization surveillance program. It is not  intended to diagnose MRSA infection nor to guide or monitor treatment for MRSA infections. Test performance is not FDA approved in patients less than 18 years old. Performed at [facility], [address] [place], [place] [address]     Coagulation Studies: No results for input(s): LABPROT, INR in the last 72 hours.  Urinalysis: No results for input(s): COLORURINE, LABSPEC, PHURINE, GLUCOSEU, HGBUR, BILIRUBINUR, KETONESUR, PROTEINUR, UROBILINOGEN, NITRITE, LEUKOCYTESUR in the last 72 hours.  Invalid input(s): APPERANCEUR     Imaging: No results found.   Medications:    sodium chloride Stopped (02/02/22 0152)   albumin human 12.5 g (02/05/22 0905)   furosemide (LASIX) 200 mg in dextrose 5% 100 mL (2mg /mL) infusion 8 mg/hr (02/04/22 0641)    heparin  5,000 Units Subcutaneous Q8H   mouth rinse  15 mL Mouth Rinse BID   midodrine  5 mg Oral TID WC   sodium chloride flush  3 mL Intravenous Q12H   sodium chloride, acetaminophen, docusate sodium, ondansetron (ZOFRAN) IV, polyethylene glycol, sodium chloride flush  Assessment/ Plan:  76 y.o. male with chronic kidney disease stage IIIa/B, hypertension, chronic systolic CHF, erythrocytosis requiring therapeutic phlebotomies, AAA, Prostate enlargement, Emphysema, pulmonary  hypertension admitted on 01/28/2022 for Acute respiratory failure with hypoxia (HCC) [J96.01]  2D echo-June 22, 2020-LVEF 45 to 50%, normal right ventricular and pulmonary artery systolic pressure 2D echo-January 29, 2022-results show LVEF is normal at 55 to 60% with normal diastolic parameters however right ventricular size is severely enlarged and there is moderately elevated pulmonary artery systolic pressure and right atrial size is moderately dilated.  #Acute kidney injury #Chronic kidney disease stage IIIb, baseline creatinine 1.76/GFR 40 from November 19, 2021 #Generalized edema # hypoalbuminemia # Hypotension #Pulmonary hypertension  Acute  kidney injury likely secondary to volume shifts.  Patient has severe pulmonary hypertension as noted in the 2D echo.  He also has severe volume overload.  Plan:  Creatinine remains stable.  Urine output recorded at 3.2 L with addition of one-time dose of metolazone.  Patient continues to be intravascularly depleted but continues to have mild generalized edema. We will continue IV Lasix drip and IV albumin at this time.  Plan to transition patient to home regimen of oral torsemide 100 mg daily in 1 to 2 days.  Lower extremity edema improving.  Continue midodrine to manage hypotension.  Encourage patient to ambulate to determine need for supplemental oxygen at discharge.  We will schedule follow-up with our office in 1 to 2 weeks.  Recommend continuation of 1500 mL fluid restriction.    LOS: [place], NP 2/21/20232:33 PM  [facility] [place], [place]

## 2022-02-05 NOTE — Progress Notes (Signed)
PROGRESS NOTE    John [name]  OZH:[id number] DOB: 1946-02-04 DOA: 01/28/2022 PCP: Tracie Harrier, MD  [id number]/[id number]-AA   Assessment & Plan:   Principal Problem:   Acute respiratory failure with hypoxia ([place]) Active Problems:   Cardiorenal syndrome, stage 1-4 or unspecified chronic kidney disease, with heart failure (HCC)   Acute left systolic heart failure (HCC)   Cor pulmonale, acute (HCC)   Renal failure syndrome   CKD (chronic kidney disease) stage 4, GFR 15-29 ml/min (HCC)   COPD exacerbation (HCC)   Cardiogenic shock (HCC)   John [name] is a 76yo AAM with hx of CHF, CAD, COPD who presented with acute hypoxemic respiratory failure w/generalized swelling and anasarca w/hypotension. Patient presented to outpatient clinic on 2/13 for scheduled lasix therapy but was unable to receive due to hypotension w/systolics in the 62X. Patient then presented to the ED and was admitted by PCCM to start Dopamine infusion to improve blood pressure and started on Lasix gtt. The patient was placed on BIPAP to improve his oxygen saturation.  Weaned off Dopa gtt and BiPAP on 2/14 and transferred to hospitalist service on 2/15.   Acute hypoxemic respiratory failure --being treated for CHF exacerbation --was on BiPAP and weaned down to 2L --Continue supplemental O2 to keep sats >=90%, wean as tolerated  Acute on Chronic systolic and diastolic heart failure Mod pulm HTN --Last ECHO 2021: EF 45-50%, no wall motion abnormalities --current Echo with improved LVEF to 55-60%, however, with moderately elevated  pulmonary artery systolic pressure.  --cardiology and nephro following Plan: --need to continue Lasix gtt, per nephro --metolazone PRN per nephro --cont midodrine and IV albumin support --GDMT held due to hypotension   COPD  --unclear if having acute exacerbation.  No significant wheezing.  Pt denied coughing.  Pt was not started on steroid or scheduled nebs. --Not on home  bronchodilators. --Monitor for now   AKI, resolved On CKD 3b baseline creatinine 1.76/GFR 40 from November 19, 2021 --nephro consulted.  Cr improving with lasix gtt. Plan: --need to continue Lasix gtt, per nephro --avoid hypotension --cont midodrine, low dose iv albumin supplementation   DVT prophylaxis: Heparin SQ Code Status: Full code  Family Communication:  Level of care: Progressive Dispo:   The patient is from: home Anticipated d/c is to: home Anticipated d/c date is: 1-2 days, per nephro  Patient currently is not medically ready to d/c due to: IV lasix gtt   Subjective and Interval History:  Pt denied dyspnea.     Objective: Vitals:   02/05/22 1106 02/05/22 1500 02/05/22 2014 02/05/22 2340  BP: 113/78 113/80 127/84 99/72  Pulse: 99 100 84 85  Resp: 20  17 14   Temp: 98.3 F (36.8 C) 98.6 F (37 C) 98.5 F (36.9 C) 98 F (36.7 C)  TempSrc:      SpO2: 93% 93% 95% 97%  Weight:      Height:        Intake/Output Summary (Last 24 hours) at 02/06/2022 0217 Last data filed at 02/05/2022 2053 Gross per 24 hour  Intake --  Output 4550 ml  Net -4550 ml   Filed Weights   02/04/22 0437 02/04/22 0904 02/05/22 0503  Weight: 103.1 kg 104.1 kg 100.1 kg    Examination:   Constitutional: NAD, AAOx3, sitting in recliner HEENT: conjunctivae and lids normal, EOMI CV: No cyanosis.   RESP: normal respiratory effort, on 2L Extremities: tense edema in BLE improved SKIN: warm, dry Neuro: II - XII grossly intact.  Psych: flat mood and affect.     Data Reviewed: I have personally reviewed following labs and imaging studies  CBC: Recent Labs  Lab 02/01/22 0611 02/02/22 0634 02/03/22 0552 02/04/22 0526 02/05/22 0502  WBC 9.0 10.4 7.6 7.2 7.5  HGB 15.4 16.8 15.2 15.4 15.5  HCT 51.2 55.3* 50.5 51.6 50.9  MCV 103.6* 103.6* 102.6* 103.8* 103.0*  PLT 101* 105* 103* 97* 454*   Basic Metabolic Panel: Recent Labs  Lab 02/01/22 0611 02/02/22 0634 02/03/22 0552  02/04/22 0526 02/05/22 0502  NA 145 145 143 142 142  K 3.4* 3.6 3.6 3.8 3.9  CL 95* 93* 94* 95* 89*  CO2 43* 40* 41* 38* 42*  GLUCOSE 103* 105* 104* 103* 123*  BUN 26* 27* 25* 23 25*  CREATININE 1.68* 1.57* 1.54* 1.60* 1.57*  CALCIUM 8.8* 9.3 8.9 9.1 9.7  MG 2.0 2.0 2.1 2.3 2.1   GFR: Estimated Creatinine Clearance: 47.4 mL/min (A) (by C-G formula based on SCr of 1.57 mg/dL (H)). Liver Function Tests: No results for input(s): AST, ALT, ALKPHOS, BILITOT, PROT, ALBUMIN in the last 168 hours.  No results for input(s): LIPASE, AMYLASE in the last 168 hours. No results for input(s): AMMONIA in the last 168 hours. Coagulation Profile: No results for input(s): INR, PROTIME in the last 168 hours. Cardiac Enzymes: No results for input(s): CKTOTAL, CKMB, CKMBINDEX, TROPONINI in the last 168 hours. BNP (last 3 results) No results for input(s): PROBNP in the last 8760 hours. HbA1C: No results for input(s): HGBA1C in the last 72 hours. CBG: No results for input(s): GLUCAP in the last 168 hours.  Lipid Profile: No results for input(s): CHOL, HDL, LDLCALC, TRIG, CHOLHDL, LDLDIRECT in the last 72 hours. Thyroid Function Tests: No results for input(s): TSH, T4TOTAL, FREET4, T3FREE, THYROIDAB in the last 72 hours. Anemia Panel: No results for input(s): VITAMINB12, FOLATE, FERRITIN, TIBC, IRON, RETICCTPCT in the last 72 hours. Sepsis Labs: No results for input(s): PROCALCITON, LATICACIDVEN in the last 168 hours.   Recent Results (from the past 240 hour(s))  Resp Panel by RT-PCR (Flu A&B, Covid) Nasopharyngeal Swab     Status: None   Collection Time: 01/28/22  7:08 PM   Specimen: Nasopharyngeal Swab; Nasopharyngeal(NP) swabs in vial transport medium  Result Value Ref Range Status   SARS Coronavirus 2 by RT PCR NEGATIVE NEGATIVE Final    Comment: (NOTE) SARS-CoV-2 target nucleic acids are NOT DETECTED.  The SARS-CoV-2 RNA is generally detectable in upper respiratory specimens during the  acute phase of infection. The lowest concentration of SARS-CoV-2 viral copies this assay can detect is 138 copies/mL. A negative result does not preclude SARS-Cov-2 infection and should not be used as the sole basis for treatment or other patient management decisions. A negative result may occur with  improper specimen collection/handling, submission of specimen other than nasopharyngeal swab, presence of viral mutation(s) within the areas targeted by this assay, and inadequate number of viral copies(<138 copies/mL). A negative result must be combined with clinical observations, patient history, and epidemiological information. The expected result is Negative.  Fact Sheet for Patients:  EntrepreneurPulse.com.au  Fact Sheet for Healthcare Providers:  IncredibleEmployment.be  This test is no t yet approved or cleared by the [place] FDA and  has been authorized for detection and/or diagnosis of SARS-CoV-2 by FDA under an Emergency Use Authorization (EUA). This EUA will remain  in effect (meaning this test can be used) for the duration of the COVID-19 declaration under Section 564(b)(1) of the Act,  21 U.S.C.section 360bbb-3(b)(1), unless the authorization is terminated  or revoked sooner.       Influenza A by PCR NEGATIVE NEGATIVE Final   Influenza B by PCR NEGATIVE NEGATIVE Final    Comment: (NOTE) The Xpert Xpress SARS-CoV-2/FLU/RSV plus assay is intended as an aid in the diagnosis of influenza from Nasopharyngeal swab specimens and should not be used as a sole basis for treatment. Nasal washings and aspirates are unacceptable for Xpert Xpress SARS-CoV-2/FLU/RSV testing.  Fact Sheet for Patients: EntrepreneurPulse.com.au  Fact Sheet for Healthcare Providers: IncredibleEmployment.be  This test is not yet approved or cleared by the [place] FDA and has been authorized for detection and/or  diagnosis of SARS-CoV-2 by FDA under an Emergency Use Authorization (EUA). This EUA will remain in effect (meaning this test can be used) for the duration of the COVID-19 declaration under Section 564(b)(1) of the Act, 21 U.S.C. section 360bbb-3(b)(1), unless the authorization is terminated or revoked.  Performed at [facility], [address] [place], [place] [address]   MRSA Next Gen by PCR, Nasal     Status: None   Collection Time: 01/28/22  8:47 PM   Specimen: Nasal Mucosa; Nasal Swab  Result Value Ref Range Status   MRSA by PCR Next Gen NOT DETECTED NOT DETECTED Final    Comment: (NOTE) The GeneXpert MRSA Assay (FDA approved for NASAL specimens only), is one component of a comprehensive MRSA colonization surveillance program. It is not intended to diagnose MRSA infection nor to guide or monitor treatment for MRSA infections. Test performance is not FDA approved in patients less than 32 years old. Performed at [facility], [address] [place], [place] [address]       Radiology Studies: No results found.   Scheduled Meds:  heparin  5,000 Units Subcutaneous Q8H   mouth rinse  15 mL Mouth Rinse BID   midodrine  5 mg Oral TID WC   sodium chloride flush  3 mL Intravenous Q12H   Continuous Infusions:  sodium chloride Stopped (02/02/22 0152)   albumin human 12.5 g (02/05/22 2131)   furosemide (LASIX) 200 mg in dextrose 5% 100 mL (2mg /mL) infusion 8 mg/hr (02/04/22 0641)     LOS: 9 days     Enzo Bi, MD [facility] If 7PM-7AM, please contact night-coverage 02/06/2022, 2:17 AM

## 2022-02-06 DIAGNOSIS — J9601 Acute respiratory failure with hypoxia: Secondary | ICD-10-CM | POA: Diagnosis not present

## 2022-02-06 LAB — BASIC METABOLIC PANEL
Anion gap: 12 (ref 5–15)
BUN: 30 mg/dL — ABNORMAL HIGH (ref 8–23)
CO2: 45 mmol/L — ABNORMAL HIGH (ref 22–32)
Calcium: 9.9 mg/dL (ref 8.9–10.3)
Chloride: 84 mmol/L — ABNORMAL LOW (ref 98–111)
Creatinine, Ser: 1.8 mg/dL — ABNORMAL HIGH (ref 0.61–1.24)
GFR, Estimated: 39 mL/min — ABNORMAL LOW (ref >60)
Glucose, Bld: 97 mg/dL (ref 70–99)
Potassium: 3.7 mmol/L (ref 3.5–5.1)
Sodium: 141 mmol/L (ref 135–145)

## 2022-02-06 LAB — CBC
HCT: 52.2 % — ABNORMAL HIGH (ref 39.0–52.0)
Hemoglobin: 15.9 g/dL (ref 13.0–17.0)
MCH: 30.9 pg (ref 26.0–34.0)
MCHC: 30.5 g/dL (ref 30.0–36.0)
MCV: 101.6 fL — ABNORMAL HIGH (ref 80.0–100.0)
Platelets: 112 10*3/uL — ABNORMAL LOW (ref 150–400)
RBC: 5.14 MIL/uL (ref 4.22–5.81)
RDW: 13.6 % (ref 11.5–15.5)
WBC: 8.6 10*3/uL (ref 4.0–10.5)
nRBC: 0 % (ref 0.0–0.2)

## 2022-02-06 LAB — MAGNESIUM: Magnesium: 2.2 mg/dL (ref 1.7–2.4)

## 2022-02-06 MED ORDER — POTASSIUM CHLORIDE CRYS ER 20 MEQ PO TBCR
40.0000 meq | EXTENDED_RELEASE_TABLET | Freq: Once | ORAL | Status: AC
  Administered 2022-02-06: 40 meq via ORAL
  Filled 2022-02-06: qty 2

## 2022-02-06 MED ORDER — TORSEMIDE 20 MG PO TABS
100.0000 mg | ORAL_TABLET | Freq: Every day | ORAL | Status: DC
  Administered 2022-02-06: 100 mg via ORAL
  Filled 2022-02-06: qty 5

## 2022-02-06 MED ORDER — MIDODRINE HCL 5 MG PO TABS: 5.0000 mg | ORAL_TABLET | Freq: Three times a day (TID) | ORAL | 0 refills | Status: AC

## 2022-02-06 MED ORDER — TORSEMIDE 100 MG PO TABS: 100.0000 mg | ORAL_TABLET | Freq: Every day | ORAL | 0 refills | Status: DC

## 2022-02-06 NOTE — TOC Transition Note (Signed)
Transition of Care [facility]) - CM/SW Discharge Note   Patient Details  Name: John [name] MRN: [id number] Date of Birth: June 07, 1946  Transition of Care [facility]) CM/SW Contact:  Beverly Sessions, RN Phone Number: 02/06/2022, 2:52 PM   Clinical Narrative:    Per Jeneen Rinks with [facility] patient does not have o2 through them   Final next level of care: [place] Barriers to Discharge: Continued Medical Work up   Patient Goals and CMS Choice Patient states their goals for this hospitalization and ongoing recovery are:: to go home CMS Medicare.gov Compare Post Acute Care list provided to:: Patient Choice offered to / list presented to : Patient  Discharge Placement                    Patient and family notified of of transfer: 02/06/22  Discharge Plan and Services                DME Arranged: Gilford Rile rolling with seat DME Agency: [employer] Date DME Agency Contacted: 02/06/22 Time DME Agency Contacted: 1740 Representative spoke with at DME Agency: [place]: PT [place]: [place] Date [place]: 02/06/22 Time [place]: 1316 Representative spoke with at [place]: [place] ([place]) Interventions     Readmission Risk Interventions No flowsheet data found.

## 2022-02-06 NOTE — Progress Notes (Addendum)
John [name], [place] 02/06/22  Subjective:   Hospital day # 9  Patient known to our practice from outpatient follow-up.  He follows up for chronic kidney disease stage III.  His baseline creatinine fluctuates between 1.5-1.7. He was scheduled to get IV furosemide in same-day surgery but could not receive it because of low blood pressure.   Patient seen siting up in chair Alert and oriented States he is ready for discharge, has been here long enough  Urine output 4.8L recorded in past 24 hours  Renal: 02/21 0701 - 02/22 0700 In: -  Out: 4800 [Urine:4800] Lab Results  Component Value Date   CREATININE 1.80 (H) 02/06/2022   CREATININE 1.57 (H) 02/05/2022   CREATININE 1.60 (H) 02/04/2022     Objective:  Vital signs in last 24 hours:  Temp:  [98 F (36.7 C)-98.6 F (37 C)] 98.1 F (36.7 C) (02/22 1130) Pulse Rate:  [84-100] 94 (02/22 1130) Resp:  [14-18] 18 (02/22 1130) BP: (99-127)/(72-89) 108/89 (02/22 1130) SpO2:  [92 %-97 %] 97 % (02/22 1130) Weight:  [94.8 kg] 94.8 kg (02/22 0358)  Weight change: -9.255 kg Filed Weights   02/04/22 0904 02/05/22 0503 02/06/22 0358  Weight: 104.1 kg 100.1 kg 94.8 kg    Intake/Output:    Intake/Output Summary (Last 24 hours) at 02/06/2022 1305 Last data filed at 02/06/2022 1128 Gross per 24 hour  Intake --  Output 4500 ml  Net -4500 ml      Physical Exam: General: No acute distress  HEENT Moist oral mucous membranes  Pulm/lungs Clear to auscultation, normal effort, [place] O2  CVS/Heart regular  Abdomen:  Soft, nontender  Extremities: 2+ lower extremity edema bilaterally  Neurologic: Alert, able to answer few simple questions  Skin: Warm, dry          Basic Metabolic Panel:  Recent Labs  Lab 02/02/22 0634 02/03/22 0552 02/04/22 0526 02/05/22 0502 02/06/22 0518  NA 145 143 142 142 141  K 3.6 3.6 3.8 3.9 3.7  CL 93* 94* 95* 89* 84*  CO2 40* 41* 38* 42* 45*  GLUCOSE 105* 104* 103* 123*  97  BUN 27* 25* 23 25* 30*  CREATININE 1.57* 1.54* 1.60* 1.57* 1.80*  CALCIUM 9.3 8.9 9.1 9.7 9.9  MG 2.0 2.1 2.3 2.1 2.2      CBC: Recent Labs  Lab 02/02/22 0634 02/03/22 0552 02/04/22 0526 02/05/22 0502 02/06/22 0518  WBC 10.4 7.6 7.2 7.5 8.6  HGB 16.8 15.2 15.4 15.5 15.9  HCT 55.3* 50.5 51.6 50.9 52.2*  MCV 103.6* 102.6* 103.8* 103.0* 101.6*  PLT 105* 103* 97* 101* 112*      No results found for: HEPBSAG, HEPBSAB, HEPBIGM    Microbiology:  Recent Results (from the past 240 hour(s))  Resp Panel by RT-PCR (Flu A&B, Covid) Nasopharyngeal Swab     Status: None   Collection Time: 01/28/22  7:08 PM   Specimen: Nasopharyngeal Swab; Nasopharyngeal(NP) swabs in vial transport medium  Result Value Ref Range Status   SARS Coronavirus 2 by RT PCR NEGATIVE NEGATIVE Final    Comment: (NOTE) SARS-CoV-2 target nucleic acids are NOT DETECTED.  The SARS-CoV-2 RNA is generally detectable in upper respiratory specimens during the acute phase of infection. The lowest concentration of SARS-CoV-2 viral copies this assay can detect is 138 copies/mL. A negative result does not preclude SARS-Cov-2 infection and should not be used as the sole basis for treatment or other patient management decisions. A negative result may occur with  improper specimen collection/handling, submission of specimen other than nasopharyngeal swab, presence of viral mutation(s) within the areas targeted by this assay, and inadequate number of viral copies(<138 copies/mL). A negative result must be combined with clinical observations, patient history, and epidemiological information. The expected result is Negative.  Fact Sheet for Patients:  EntrepreneurPulse.com.au  Fact Sheet for Healthcare Providers:  IncredibleEmployment.be  This test is no t yet approved or cleared by the [place] FDA and  has been authorized for detection and/or diagnosis of SARS-CoV-2  by FDA under an Emergency Use Authorization (EUA). This EUA will remain  in effect (meaning this test can be used) for the duration of the COVID-19 declaration under Section 564(b)(1) of the Act, 21 U.S.C.section 360bbb-3(b)(1), unless the authorization is terminated  or revoked sooner.       Influenza A by PCR NEGATIVE NEGATIVE Final   Influenza B by PCR NEGATIVE NEGATIVE Final    Comment: (NOTE) The Xpert Xpress SARS-CoV-2/FLU/RSV plus assay is intended as an aid in the diagnosis of influenza from Nasopharyngeal swab specimens and should not be used as a sole basis for treatment. Nasal washings and aspirates are unacceptable for Xpert Xpress SARS-CoV-2/FLU/RSV testing.  Fact Sheet for Patients: EntrepreneurPulse.com.au  Fact Sheet for Healthcare Providers: IncredibleEmployment.be  This test is not yet approved or cleared by the [place] FDA and has been authorized for detection and/or diagnosis of SARS-CoV-2 by FDA under an Emergency Use Authorization (EUA). This EUA will remain in effect (meaning this test can be used) for the duration of the COVID-19 declaration under Section 564(b)(1) of the Act, 21 U.S.C. section 360bbb-3(b)(1), unless the authorization is terminated or revoked.  Performed at [facility], [address] [place], [place] [address]   MRSA Next Gen by PCR, Nasal     Status: None   Collection Time: 01/28/22  8:47 PM   Specimen: Nasal Mucosa; Nasal Swab  Result Value Ref Range Status   MRSA by PCR Next Gen NOT DETECTED NOT DETECTED Final    Comment: (NOTE) The GeneXpert MRSA Assay (FDA approved for NASAL specimens only), is one component of a comprehensive MRSA colonization surveillance program. It is not intended to diagnose MRSA infection nor to guide or monitor treatment for MRSA infections. Test performance is not FDA approved in patients less than 40 years old. Performed at [facility],  [address] [place], [place] [address]     Coagulation Studies: No results for input(s): LABPROT, INR in the last 72 hours.  Urinalysis: No results for input(s): COLORURINE, LABSPEC, PHURINE, GLUCOSEU, HGBUR, BILIRUBINUR, KETONESUR, PROTEINUR, UROBILINOGEN, NITRITE, LEUKOCYTESUR in the last 72 hours.  Invalid input(s): APPERANCEUR     Imaging: No results found.   Medications:    sodium chloride Stopped (02/02/22 0152)   albumin human 12.5 g (02/06/22 1029)    heparin  5,000 Units Subcutaneous Q8H   mouth rinse  15 mL Mouth Rinse BID   midodrine  5 mg Oral TID WC   sodium chloride flush  3 mL Intravenous Q12H   torsemide  100 mg Oral Daily   sodium chloride, acetaminophen, docusate sodium, ondansetron (ZOFRAN) IV, polyethylene glycol, sodium chloride flush  Assessment/ Plan:  76 y.o. male with chronic kidney disease stage IIIa/B, hypertension, chronic systolic CHF, erythrocytosis requiring therapeutic phlebotomies, AAA, Prostate enlargement, Emphysema, pulmonary hypertension admitted on 01/28/2022 for Acute respiratory failure with hypoxia (HCC) [J96.01]  2D echo-June 22, 2020-LVEF 45 to 50%, normal right ventricular and pulmonary artery systolic pressure 2D echo-January 29, 2022-results show LVEF is normal at 55 to 60% with normal diastolic parameters however right ventricular size is severely enlarged and there is moderately elevated pulmonary artery systolic pressure and right atrial size is moderately dilated.  #Acute kidney injury #Chronic kidney disease stage IIIb, baseline creatinine 1.76/GFR 40 from November 19, 2021 #Generalized edema # hypoalbuminemia # Hypotension #Pulmonary hypertension  Acute kidney injury likely secondary to volume shifts.  Patient has severe pulmonary hypertension as noted in the 2D echo.  He also has severe volume overload.  Plan:  Creatinine slightly increased but acceptable. UOP 4.8L recorded. Stopped IV Lasix and prescribed home  regimen of oral Torsemide 100mg  daily. Continue prescribed Torsemide at discharge. Will schedule outpatient follow up at our office in 1-2 weeks. Recommend maintaining 1521ml fluid restriction. Midodrine prescribed to maintain adequate blood pressure.     LOS: [address] NP 2/22/20231:05 [place], [place]

## 2022-02-06 NOTE — Discharge Summary (Signed)
Physician Discharge Summary  Vidit Boissonneault FVC:[id number] DOB: May 29, 1946 DOA: 01/28/2022  PCP: Tracie Harrier, MD  Admit date: 01/28/2022 Discharge date: 02/06/2022  Admitted From: Home Disposition: Home with home health  Recommendations for Outpatient Follow-up:  Follow up with PCP in 1-2 weeks Follow-up outpatient nephrology  Home Health: Yes PT Equipment/Devices: None  Discharge Condition: Stable CODE STATUS: Full Diet recommendation: Heart healthy  Brief/Interim Summary: 76yo AAM with hx of CHF, CAD, COPD who presented with acute hypoxemic respiratory failure w/generalized swelling and anasarca w/hypotension. Patient presented to outpatient clinic on 2/13 for scheduled lasix therapy but was unable to receive due to hypotension w/systolics in the 63W. Patient then presented to the ED and was admitted by PCCM to start Dopamine infusion to improve blood pressure and started on Lasix gtt. The patient was placed on BIPAP to improve his oxygen saturation.   Weaned off Dopa gtt and BiPAP on 2/14 and transferred to hospitalist service on 2/15.  Remained stable during remainder of hospitalization.  Was on a Lasix gtt. with nephrology following.  On day of discharge nephrology discontinue Lasix GTT and transition to oral torsemide 100 mg daily.  Patient was insisted upon leaving.  I explained that we needed nephrology clearance.  He states that he was leaving 1 where another.  At time of discharge patient is medically stable.  He did tell me that he had home oxygen as he is currently on 2 L.  He was not able to provide me the name of the company supplying it.  Case management was informed of this.  Patient did state that he had a tank and concentrator at home.  Unclear whether this is accurate or not.  Given multiple severe comorbidities patient will remain high risk for readmission.  Nephrology will contact him to set up outpatient visit.    Discharge Diagnoses:  Principal Problem:    Acute respiratory failure with hypoxia (HCC) Active Problems:   Cardiorenal syndrome, stage 1-4 or unspecified chronic kidney disease, with heart failure (HCC)   Acute left systolic heart failure (HCC)   Cor pulmonale, acute (HCC)   Renal failure syndrome   CKD (chronic kidney disease) stage 4, GFR 15-29 ml/min (HCC)   COPD exacerbation (HCC)   Cardiogenic shock (HCC)  Acute hypoxemic respiratory failure --being treated for CHF exacerbation --was on BiPAP and weaned down to 2L -- Patient on 2 L at time of discharge.  Patient states that he is set up with home oxygen already.  He was unable to provide me the agency that supplies home oxygen.  Case management also unable to verify however patient not willing to wait and stated he was fine and left   Acute on Chronic systolic and diastolic heart failure Mod pulm HTN --Last ECHO 2021: EF 45-50%, no wall motion abnormalities --current Echo with improved LVEF to 55-60%, however, with moderately elevated  pulmonary artery systolic pressure.  --cardiology and nephro following Plan: Transition to p.o. torsemide 100 mg daily.  Discharge home with outpatient nephrology follow-up  COPD  --unclear if having acute exacerbation.  No significant wheezing.  Pt denied coughing.  Pt was not started on steroid or scheduled nebs. --Not on home bronchodilators. -- Respiratory status stable at time of discharge   AKI, resolved On CKD 3b baseline creatinine 1.76/GFR 40 from November 19, 2021 --nephro consulted.  Cr improving with lasix gtt. Plan: P.o. torsemide 100 mg daily as above.  Outpatient nephrology follow-up.  Discharge Instructions  Discharge Instructions  Diet - low sodium heart healthy   Complete by: As directed    Increase activity slowly   Complete by: As directed       Allergies as of 02/06/2022   No Known Allergies      Medication List     TAKE these medications    aspirin EC 325 MG tablet Take 1 tablet (325 mg  total) by mouth daily.   losartan 100 MG tablet Commonly known as: COZAAR Take 100 mg by mouth daily.   midodrine 5 MG tablet Commonly known as: PROAMATINE Take 1 tablet (5 mg total) by mouth 3 (three) times daily with meals.   torsemide 100 MG tablet Commonly known as: DEMADEX Take 1 tablet (100 mg total) by mouth daily. Take 1 tablet (100 mg total) by mouth daily What changed: when to take this               Durable Medical Equipment  (From admission, onward)           Start     Ordered   02/06/22 1153  For home use only DME 4 wheeled rolling walker with seat  Once       Question:  Patient needs a walker to treat with the following condition  Answer:  Weakness   02/06/22 1152            No Known Allergies  Consultations: Nephrology Cardiology   Procedures/Studies: DG Chest Portable 1 View  Result Date: 01/28/2022 CLINICAL DATA:  Low oxygen saturation EXAM: PORTABLE CHEST 1 VIEW COMPARISON:  06/21/2020, CT 01/18/2022 FINDINGS: Cardiomegaly with central congestion. Streaky atelectasis or scarring at the bases. No pleural effusion or pneumothorax. Emphysematous disease. IMPRESSION: 1. Cardiomegaly with central congestion. 2. Hazy atelectasis or scarring right base 3. Emphysema Electronically Signed   By: Donavan Foil M.D.   On: 01/28/2022 17:20   ECHOCARDIOGRAM COMPLETE  Result Date: 01/29/2022    ECHOCARDIOGRAM REPORT   Patient Name:   Kennen Degan Date of Exam: 01/29/2022 Medical Rec #:  [id number]      Height:       70.0 in Accession #:    [id number]     Weight:       243.4 lb Date of Birth:  November 16, 1946     BSA:          2.269 m Patient Age:    76 years       BP:           88/55 mmHg Patient Gender: M              HR:           79 bpm. Exam Location:  [facility] Procedure: 2D Echo, Color Doppler and Cardiac Doppler Indications:     I50.21 congestive heart failure-Acute Systolic  History:         Patient has prior history of Echocardiogram examinations. CHF,                   COPD; Risk Factors:Hypertension.  Sonographer:     Charmayne Sheer Referring Phys:  [id number] Flora Lipps Diagnosing Phys: Serafina Royals MD  Sonographer Comments: Suboptimal apical window and no subcostal window. Image acquisition challenging due to COPD. IMPRESSIONS  1. Left ventricular ejection fraction, by estimation, is 55 to 60%. The left ventricle has normal function. The left ventricle has no regional wall motion abnormalities. Left ventricular diastolic parameters were normal.  2. Right ventricular systolic function is mildly reduced. The right  ventricular size is severely enlarged. There is moderately elevated pulmonary artery systolic pressure.  3. Right atrial size was moderately dilated.  4. The mitral valve is normal in structure. Trivial mitral valve regurgitation.  5. Tricuspid valve regurgitation is mild to moderate.  6. The aortic valve is normal in structure. Aortic valve regurgitation is not visualized. FINDINGS  Left Ventricle: Left ventricular ejection fraction, by estimation, is 55 to 60%. The left ventricle has normal function. The left ventricle has no regional wall motion abnormalities. The left ventricular internal cavity size was small. There is no left ventricular hypertrophy. Left ventricular diastolic parameters were normal. Right Ventricle: The right ventricular size is severely enlarged. No increase in right ventricular wall thickness. Right ventricular systolic function is mildly reduced. There is moderately elevated pulmonary artery systolic pressure. Left Atrium: Left atrial size was normal in size. Right Atrium: Right atrial size was moderately dilated. Pericardium: There is no evidence of pericardial effusion. Mitral Valve: The mitral valve is normal in structure. Trivial mitral valve regurgitation. MV peak gradient, 2.4 mmHg. The mean mitral valve gradient is 1.0 mmHg. Tricuspid Valve: The tricuspid valve is normal in structure. Tricuspid valve regurgitation is mild to  moderate. Aortic Valve: The aortic valve is normal in structure. Aortic valve regurgitation is not visualized. Aortic valve mean gradient measures 4.0 mmHg. Aortic valve peak gradient measures 7.5 mmHg. Aortic valve area, by VTI measures 3.18 cm. Pulmonic Valve: The pulmonic valve was normal in structure. Pulmonic valve regurgitation is mild. Aorta: The aortic root and ascending aorta are structurally normal, with no evidence of dilitation. IAS/Shunts: No atrial level shunt detected by color flow Doppler.  LEFT VENTRICLE PLAX 2D LVIDd:         4.11 cm   Diastology LVIDs:         2.95 cm   LV e' medial:    4.79 cm/s LV PW:         1.19 cm   LV E/e' medial:  11.2 LV IVS:        1.07 cm   LV e' lateral:   11.50 cm/s LVOT diam:     2.20 cm   LV E/e' lateral: 4.7 LV SV:         62 LV SV Index:   27 LVOT Area:     3.80 cm  RIGHT VENTRICLE RV Basal diam:  4.80 cm RV Mid diam:    5.53 cm RV S prime:     13.50 cm/s LEFT ATRIUM           Index        RIGHT ATRIUM           Index LA diam:      3.50 cm 1.54 cm/m   RA Area:     28.00 cm LA Vol (A4C): 34.4 ml 15.16 ml/m  RA Volume:   109.00 ml 48.04 ml/m  AORTIC VALVE                    PULMONIC VALVE AV Area (Vmax):    3.00 cm     PV Vmax:          1.06 m/s AV Area (Vmean):   2.96 cm     PV Vmean:         72.500 cm/s AV Area (VTI):     3.18 cm     PV VTI:           0.178 m AV Vmax:  137.00 cm/s  PV Peak grad:     4.5 mmHg AV Vmean:          89.500 cm/s  PV Mean grad:     2.0 mmHg AV VTI:            0.195 m      PR End Diast Vel: 3.72 msec AV Peak Grad:      7.5 mmHg AV Mean Grad:      4.0 mmHg LVOT Vmax:         108.00 cm/s LVOT Vmean:        69.800 cm/s LVOT VTI:          0.163 m LVOT/AV VTI ratio: 0.84  AORTA Ao Root diam: 4.00 cm MITRAL VALVE               TRICUSPID VALVE MV Area (PHT): 3.12 cm    TR Peak grad:   39.9 mmHg MV Area VTI:   2.94 cm    TR Vmax:        316.00 cm/s MV Peak grad:  2.4 mmHg MV Mean grad:  1.0 mmHg    SHUNTS MV Vmax:       0.77 m/s     Systemic VTI:  0.16 m MV Vmean:      56.0 cm/s   Systemic Diam: 2.20 cm MV Decel Time: 243 msec MV E velocity: 53.60 cm/s MV A velocity: 82.30 cm/s MV E/A ratio:  0.65 Serafina Royals MD Electronically signed by Serafina Royals MD Signature Date/Time: 01/29/2022/12:40:03 PM    Final    CT CHEST ABDOMEN PELVIS WO CONTRAST  Result Date: 01/21/2022 CLINICAL DATA:  Thrombocytopenia EXAM: CT CHEST, ABDOMEN AND PELVIS WITHOUT CONTRAST TECHNIQUE: Multidetector CT imaging of the chest, abdomen and pelvis was performed following the standard protocol without IV contrast. RADIATION DOSE REDUCTION: This exam was performed according to the departmental dose-optimization program which includes automated exposure control, adjustment of the mA and/or kV according to patient size and/or use of iterative reconstruction technique. COMPARISON:  None. FINDINGS: CT CHEST FINDINGS Cardiovascular: Extensive multi-vessel coronary artery calcification. Cardiac size is within normal limits. No pericardial effusion. The central pulmonary arteries are enlarged in keeping with changes of pulmonary arterial hypertension. Moderate atherosclerotic calcification within the thoracic aorta. The ascending aorta is dilated, measuring 5.1 cm in diameter, stable since prior examination. Descending thoracic aorta is of normal caliber. Mediastinum/Nodes: Visualized thyroid is unremarkable. No pathologic thoracic adenopathy. Esophagus unremarkable. Lungs/Pleura: Moderate emphysema. Mild bronchial wall thickening in keeping with airway inflammation. No focal pulmonary nodules or infiltrates identified. No pneumothorax or pleural effusion. No central obstructing lesion. Musculoskeletal: No acute bone abnormality. No lytic or blastic bone lesion. CT ABDOMEN PELVIS FINDINGS Hepatobiliary: No focal liver abnormality is seen. No gallstones, gallbladder wall thickening, or biliary dilatation. Pancreas: Unremarkable Spleen: Normal in size without focal  abnormality. Adrenals/Urinary Tract: The adrenal glands are unremarkable. The kidneys are normal on this noncontrast examination. The bladder is circumferentially thick walled and there is extensive perivesicular inflammatory stranding suggesting changes of a diffuse infectious or inflammatory cystitis. The bladder is not distended. Stomach/Bowel: Moderate descending and sigmoid colonic diverticulosis without superimposed acute inflammatory change. The stomach, small bowel, and large bowel are otherwise unremarkable. Appendix normal. No free intraperitoneal gas. Trace ascites. Vascular/Lymphatic: 3.3 x 3.1 cm infrarenal abdominal aortic aneurysm is present. Superimposed moderate aortoiliac atherosclerotic calcification. No pathologic adenopathy within the abdomen and pelvis. Reproductive: Marked prostatic enlargement is present within estimated prostatic volume of 123 cc. There  is mild periprostatic inflammatory stranding also identified. Other: Mild diffuse body wall subcutaneous edema. Mild diffuse retroperitoneal edema. No abdominal wall hernia. Musculoskeletal: No acute bone abnormality. Degenerative changes are seen within the lumbar spine. No lytic or blastic bone lesion. IMPRESSION: No acute intrathoracic or intra-abdominal pathology identified. Extensive multi-vessel coronary artery calcification. Morphologic changes in keeping with pulmonary arterial hypertension. Ascending thoracic aortic aneurysm with maximal transaxial dimension of 5.1 cm, stable since prior examination of 09/01/2019. Ascending thoracic aortic aneurysm. Recommend semi-annual imaging followup by CTA or MRA and referral to cardiothoracic surgery if not already obtained. This recommendation follows 2010 ACCF/AHA/AATS/ACR/ASA/SCA/SCAI/SIR/STS/SVM Guidelines for the Diagnosis and Management of Patients With Thoracic Aortic Disease. Circulation. 2010; 121: Z025-E527. Aortic aneurysm NOS (ICD10-I71.9) Moderate emphysema. Marked prostatic  enlargement. Superimposed periprostatic and perivesicular inflammatory stranding suggesting changes of infectious or inflammatory cysto prostatitis. No evidence of secondary urinary retention. Moderate distal colonic diverticulosis without superimposed acute inflammatory change. 3.3 cm infrarenal abdominal aortic aneurysm. Recommend follow-up every 3 years. Reference: J Am Coll Radiol 7824;23:536-144. Mild anasarca with mild diffuse body wall subcutaneous edema, trace ascites, and retroperitoneal edema. Electronically Signed   By: Fidela Salisbury M.D.   On: 01/21/2022 01:29      Subjective: Seen and examined on day of discharge.  Stable, no distress.  Insistent upon leaving today.  Discharge Exam: Vitals:   02/06/22 0748 02/06/22 1130  BP: 118/85 108/89  Pulse: 89 94  Resp: 18 18  Temp: 98.2 F (36.8 C) 98.1 F (36.7 C)  SpO2: 92% 97%   Vitals:   02/05/22 2340 02/06/22 0358 02/06/22 0748 02/06/22 1130  BP: 99/72 118/77 118/85 108/89  Pulse: 85 91 89 94  Resp: 14 15 18 18   Temp: 98 F (36.7 C) 98.2 F (36.8 C) 98.2 F (36.8 C) 98.1 F (36.7 C)  TempSrc:      SpO2: 97% 96% 92% 97%  Weight:  94.8 kg    Height:        General: Pt is alert, awake, not in acute distress Cardiovascular: RRR, S1/S2 +, no rubs, no gallops Respiratory: CTA bilaterally, no wheezing, no rhonchi Abdominal: Soft, NT, ND, bowel sounds + Extremities: no edema, no cyanosis    The results of significant diagnostics from this hospitalization (including imaging, microbiology, ancillary and laboratory) are listed below for reference.     Microbiology: Recent Results (from the past 240 hour(s))  Resp Panel by RT-PCR (Flu A&B, Covid) Nasopharyngeal Swab     Status: None   Collection Time: 01/28/22  7:08 PM   Specimen: Nasopharyngeal Swab; Nasopharyngeal(NP) swabs in vial transport medium  Result Value Ref Range Status   SARS Coronavirus 2 by RT PCR NEGATIVE NEGATIVE Final    Comment: (NOTE) SARS-CoV-2  target nucleic acids are NOT DETECTED.  The SARS-CoV-2 RNA is generally detectable in upper respiratory specimens during the acute phase of infection. The lowest concentration of SARS-CoV-2 viral copies this assay can detect is 138 copies/mL. A negative result does not preclude SARS-Cov-2 infection and should not be used as the sole basis for treatment or other patient management decisions. A negative result may occur with  improper specimen collection/handling, submission of specimen other than nasopharyngeal swab, presence of viral mutation(s) within the areas targeted by this assay, and inadequate number of viral copies(<138 copies/mL). A negative result must be combined with clinical observations, patient history, and epidemiological information. The expected result is Negative.  Fact Sheet for Patients:  EntrepreneurPulse.com.au  Fact Sheet for Healthcare Providers:  IncredibleEmployment.be  This test is no t yet approved or cleared by the [place] and  has been authorized for detection and/or diagnosis of SARS-CoV-2 by FDA under an Emergency Use Authorization (EUA). This EUA will remain  in effect (meaning this test can be used) for the duration of the COVID-19 declaration under Section 564(b)(1) of the Act, 21 U.S.C.section 360bbb-3(b)(1), unless the authorization is terminated  or revoked sooner.       Influenza A by PCR NEGATIVE NEGATIVE Final   Influenza B by PCR NEGATIVE NEGATIVE Final    Comment: (NOTE) The Xpert Xpress SARS-CoV-2/FLU/RSV plus assay is intended as an aid in the diagnosis of influenza from Nasopharyngeal swab specimens and should not be used as a sole basis for treatment. Nasal washings and aspirates are unacceptable for Xpert Xpress SARS-CoV-2/FLU/RSV testing.  Fact Sheet for Patients: EntrepreneurPulse.com.au  Fact Sheet for Healthcare  Providers: IncredibleEmployment.be  This test is not yet approved or cleared by the [place] FDA and has been authorized for detection and/or diagnosis of SARS-CoV-2 by FDA under an Emergency Use Authorization (EUA). This EUA will remain in effect (meaning this test can be used) for the duration of the COVID-19 declaration under Section 564(b)(1) of the Act, 21 U.S.C. section 360bbb-3(b)(1), unless the authorization is terminated or revoked.  Performed at [facility], [address] [place], [place] [address]   MRSA Next Gen by PCR, Nasal     Status: None   Collection Time: 01/28/22  8:47 PM   Specimen: Nasal Mucosa; Nasal Swab  Result Value Ref Range Status   MRSA by PCR Next Gen NOT DETECTED NOT DETECTED Final    Comment: (NOTE) The GeneXpert MRSA Assay (FDA approved for NASAL specimens only), is one component of a comprehensive MRSA colonization surveillance program. It is not intended to diagnose MRSA infection nor to guide or monitor treatment for MRSA infections. Test performance is not FDA approved in patients less than 4 years old. Performed at [facility], [address] [place], [place] [address]      Labs: BNP (last 3 results) Recent Labs    01/28/22 1603  BNP 694.8*   Basic Metabolic Panel: Recent Labs  Lab 02/02/22 0634 02/03/22 0552 02/04/22 0526 02/05/22 0502 02/06/22 0518  NA 145 143 142 142 141  K 3.6 3.6 3.8 3.9 3.7  CL 93* 94* 95* 89* 84*  CO2 40* 41* 38* 42* 45*  GLUCOSE 105* 104* 103* 123* 97  BUN 27* 25* 23 25* 30*  CREATININE 1.57* 1.54* 1.60* 1.57* 1.80*  CALCIUM 9.3 8.9 9.1 9.7 9.9  MG 2.0 2.1 2.3 2.1 2.2   Liver Function Tests: No results for input(s): AST, ALT, ALKPHOS, BILITOT, PROT, ALBUMIN in the last 168 hours. No results for input(s): LIPASE, AMYLASE in the last 168 hours. No results for input(s): AMMONIA in the last 168 hours. CBC: Recent Labs  Lab 02/02/22 0634  02/03/22 0552 02/04/22 0526 02/05/22 0502 02/06/22 0518  WBC 10.4 7.6 7.2 7.5 8.6  HGB 16.8 15.2 15.4 15.5 15.9  HCT 55.3* 50.5 51.6 50.9 52.2*  MCV 103.6* 102.6* 103.8* 103.0* 101.6*  PLT 105* 103* 97* 101* 112*   Cardiac Enzymes: No results for input(s): CKTOTAL, CKMB, CKMBINDEX, TROPONINI in the last 168 hours. BNP: Invalid input(s): POCBNP CBG: No results for input(s): GLUCAP in the last 168 hours. D-Dimer No results for input(s): DDIMER in the last 72 hours. Hgb A1c No results for input(s): HGBA1C in the last 72 hours. Lipid Profile  No results for input(s): CHOL, HDL, LDLCALC, TRIG, CHOLHDL, LDLDIRECT in the last 72 hours. Thyroid function studies No results for input(s): TSH, T4TOTAL, T3FREE, THYROIDAB in the last 72 hours.  Invalid input(s): FREET3 Anemia work up No results for input(s): VITAMINB12, FOLATE, FERRITIN, TIBC, IRON, RETICCTPCT in the last 72 hours. Urinalysis    Component Value Date/Time   COLORURINE YELLOW (A) 01/30/2022 2331   APPEARANCEUR CLEAR (A) 01/30/2022 2331   LABSPEC 1.008 01/30/2022 2331   PHURINE 5.0 01/30/2022 2331   GLUCOSEU NEGATIVE 01/30/2022 2331   HGBUR NEGATIVE 01/30/2022 2331   BILIRUBINUR NEGATIVE 01/30/2022 2331   KETONESUR NEGATIVE 01/30/2022 2331   PROTEINUR NEGATIVE 01/30/2022 2331   NITRITE NEGATIVE 01/30/2022 2331   LEUKOCYTESUR NEGATIVE 01/30/2022 2331   Sepsis Labs Invalid input(s): PROCALCITONIN,  WBC,  LACTICIDVEN Microbiology Recent Results (from the past 240 hour(s))  Resp Panel by RT-PCR (Flu A&B, Covid) Nasopharyngeal Swab     Status: None   Collection Time: 01/28/22  7:08 PM   Specimen: Nasopharyngeal Swab; Nasopharyngeal(NP) swabs in vial transport medium  Result Value Ref Range Status   SARS Coronavirus 2 by RT PCR NEGATIVE NEGATIVE Final    Comment: (NOTE) SARS-CoV-2 target nucleic acids are NOT DETECTED.  The SARS-CoV-2 RNA is generally detectable in upper respiratory specimens during the acute phase  of infection. The lowest concentration of SARS-CoV-2 viral copies this assay can detect is 138 copies/mL. A negative result does not preclude SARS-Cov-2 infection and should not be used as the sole basis for treatment or other patient management decisions. A negative result may occur with  improper specimen collection/handling, submission of specimen other than nasopharyngeal swab, presence of viral mutation(s) within the areas targeted by this assay, and inadequate number of viral copies(<138 copies/mL). A negative result must be combined with clinical observations, patient history, and epidemiological information. The expected result is Negative.  Fact Sheet for Patients:  EntrepreneurPulse.com.au  Fact Sheet for Healthcare Providers:  IncredibleEmployment.be  This test is no t yet approved or cleared by the [place] FDA and  has been authorized for detection and/or diagnosis of SARS-CoV-2 by FDA under an Emergency Use Authorization (EUA). This EUA will remain  in effect (meaning this test can be used) for the duration of the COVID-19 declaration under Section 564(b)(1) of the Act, 21 U.S.C.section 360bbb-3(b)(1), unless the authorization is terminated  or revoked sooner.       Influenza A by PCR NEGATIVE NEGATIVE Final   Influenza B by PCR NEGATIVE NEGATIVE Final    Comment: (NOTE) The Xpert Xpress SARS-CoV-2/FLU/RSV plus assay is intended as an aid in the diagnosis of influenza from Nasopharyngeal swab specimens and should not be used as a sole basis for treatment. Nasal washings and aspirates are unacceptable for Xpert Xpress SARS-CoV-2/FLU/RSV testing.  Fact Sheet for Patients: EntrepreneurPulse.com.au  Fact Sheet for Healthcare Providers: IncredibleEmployment.be  This test is not yet approved or cleared by the [place] FDA and has been authorized for detection and/or diagnosis of  SARS-CoV-2 by FDA under an Emergency Use Authorization (EUA). This EUA will remain in effect (meaning this test can be used) for the duration of the COVID-19 declaration under Section 564(b)(1) of the Act, 21 U.S.C. section 360bbb-3(b)(1), unless the authorization is terminated or revoked.  Performed at [facility], [address] [place], [place] [address]   MRSA Next Gen by PCR, Nasal     Status: None   Collection Time: 01/28/22  8:47 PM   Specimen: Nasal Mucosa; Nasal  Swab  Result Value Ref Range Status   MRSA by PCR Next Gen NOT DETECTED NOT DETECTED Final    Comment: (NOTE) The GeneXpert MRSA Assay (FDA approved for NASAL specimens only), is one component of a comprehensive MRSA colonization surveillance program. It is not intended to diagnose MRSA infection nor to guide or monitor treatment for MRSA infections. Test performance is not FDA approved in patients less than 21 years old. Performed at [facility], [address], [place], [place] [address]      Time coordinating discharge: Over 30 minutes  SIGNED:   Sidney Ace, MD  [facility] 02/06/2022, 4:41 PM Pager   If 7PM-7AM, please contact night-coverage

## 2022-02-06 NOTE — Progress Notes (Signed)
Physical Therapy Treatment Patient Details Name: John [name] MRN: [id number] DOB: 12/28/1945 Today's Date: 02/06/2022   History of Present Illness 76 yo M with acute resp distress with hypoxia with generalized swelling and anasarca with low BP.  Acute CHF exacerbation with  progressive renal failure with generalized anasarca with progressive cardio-renal syndrome with COPD exacerbation cor pulmonale and pulmonary HTN    PT Comments    Pt was sitting in recliner upon arriving. He is A and O and agreeable to session. Pt was on 2 L o2 throughout session. Several times during ambulation, O2 dropped however pt does not report any symptoms. At baseline, pt is not on O2. Author questions reliability of portable pulse oximeter used. RN states she will use pulse oximeter with pleth to determine if O2 at DC is necessary. He was easily and safely able to stand and ambulate without physical assistance. Pt does have antalgic gait pattern due to plantar fasciitis however no LOB or safety concerns. He is safe from a acute PT standpoint to Dc home with family support.     Recommendations for follow up therapy are one component of a multi-disciplinary discharge planning process, led by the attending physician.  Recommendations may be updated based on patient status, additional functional criteria and insurance authorization.  Follow Up Recommendations  No PT follow up     Assistance Recommended at Discharge Intermittent Supervision/Assistance  Patient can return home with the following Assistance with cooking/housework;Help with stairs or ramp for entrance   Equipment Recommendations  Rollator (4 wheels)       Precautions / Restrictions Precautions Precautions: Fall Restrictions Weight Bearing Restrictions: No     Mobility  Bed Mobility  General bed mobility comments: in recliner pre/post session    Transfers Overall transfer level: Modified independent Equipment used: None Transfers: Sit  to/from Stand Sit to Stand: Supervision     Ambulation/Gait Ambulation/Gait assistance: Supervision Gait Distance (Feet): 120 Feet Assistive device: IV Pole Gait Pattern/deviations: Step-through pattern, Antalgic (plantar faciaitis) Gait velocity: decreased     General Gait Details: improved gait today - less :L foot pain. Pt has poor accuracy of SAO2 on portable pulse ox. RN made aware and will ambulate pt later for O2 qualification.     Balance Overall balance assessment: Modified Independent Sitting-balance support: Feet supported Sitting balance-Leahy Scale: Good Sitting balance - Comments: steady with walker. encouraged use of RW at DC at home until foot pain resolves   Standing balance support: Bilateral upper extremity supported Standing balance-Leahy Scale: Good       Cognition Arousal/Alertness: Awake/alert Behavior During Therapy: WFL for tasks assessed/performed Overall Cognitive Status: Within Functional Limits for tasks assessed      General Comments: Pt was A and O x 4 and agreeable to session. Has flat affect.               Pertinent Vitals/Pain Pain Assessment Pain Assessment: No/denies pain Faces Pain Scale: No hurt Pain Location: L foot (h/o plantar fasciitis) Pain Descriptors / Indicators: Tender, Sore Pain Intervention(s): Limited activity within patient's tolerance, Monitored during session, Repositioned     PT Goals (current goals can now be found in the care plan section) Acute Rehab PT Goals Patient Stated Goal: go home Progress towards PT goals: Progressing toward goals    Frequency    Min 2X/week      PT Plan Current plan remains appropriate       AM-PAC PT "6 Clicks" Mobility   Outcome Measure  Help needed  turning from your back to your side while in a flat bed without using bedrails?: None Help needed moving from lying on your back to sitting on the side of a flat bed without using bedrails?: None Help needed moving to  and from a bed to a chair (including a wheelchair)?: None Help needed standing up from a chair using your arms (e.g., wheelchair or bedside chair)?: None Help needed to walk in hospital room?: A Little Help needed climbing 3-5 steps with a railing? : A Little 6 Click Score: 22    End of Session Equipment Utilized During Treatment: Oxygen (2 L) Activity Tolerance: Patient tolerated treatment well Patient left: in chair;with call bell/phone within reach;with chair alarm set;with nursing/sitter in room Nurse Communication: Mobility status;Precautions;Other (comment) PT Visit Diagnosis: Muscle weakness (generalized) (M62.81);Difficulty in walking, not elsewhere classified (R26.2);Unsteadiness on feet (R26.81)     Time: 7680-8811 PT Time Calculation (min) (ACUTE ONLY): 42 min  Charges:  $Gait Training: 23-37 mins $Therapeutic Activity: 8-22 mins                     Julaine Fusi PTA 02/06/22, 9:19 AM

## 2022-02-06 NOTE — Care Management Important Message (Signed)
Important Message  Patient Details  Name: John [name] MRN: [id number] Date of Birth: Dec 31, 1945   Medicare Important Message Given:  Yes     [name] 02/06/2022, 10:41 AM

## 2022-02-06 NOTE — TOC Transition Note (Addendum)
Transition of Care [facility]) - CM/SW Discharge Note   Patient Details  Name: Jakyri Brunkhorst MRN: [id number] Date of Birth: Dec 23, 1945  [facility]) CM/SW Contact:  Alberteen Sam, LCSW Phone Number: 02/06/2022, 1:16 PM   Clinical Narrative:     Update 2:46 pm: CSW spoke with Andee Poles with [employer] she report she went to give rollator to patient and he had already left, per RN he had left. Andee Poles will call patient to see if he wants it shipped to home. Patient would not tell MD or RN who his home O2 was with however stated he was fine and walked out.      Patient to dc today with Sand Reeanna Acri PT through M[facility]eth Ide informed of dc.   Rollator to be delivered to room via [place] with [employer].   Per MD and RN patient has home O2, CSW spoke with [employer] they report they do not service him, CSW has followed up with Jeneen Rinks with [employer] to confirm patient has home set up.   No further dc needs identified.    Final next level of care: [place] Barriers to Discharge: Continued Medical Work up   Patient Goals and CMS Choice Patient states their goals for this hospitalization and ongoing recovery are:: to go home CMS Medicare.gov Compare Post Acute Care list provided to:: Patient Choice offered to / list presented to : Patient  Discharge Placement                    Patient and family notified of of transfer: 02/06/22  Discharge Plan and Services                DME Arranged: Gilford Rile rolling with seat DME Agency: [employer] Date DME Agency Contacted: 02/06/22 Time DME Agency Contacted: [id number] Representative spoke with at DME Agency: [place]: PT [place]: [place] Date [place]: 02/06/22 Time [place]: 1316 Representative spoke with at [place]: [place] ([place]) Interventions     Readmission Risk Interventions No flowsheet data found.

## 2022-02-11 DIAGNOSIS — R7309 Other abnormal glucose: Secondary | ICD-10-CM | POA: Diagnosis not present

## 2022-02-11 DIAGNOSIS — I7121 Aneurysm of the ascending aorta, without rupture: Secondary | ICD-10-CM | POA: Diagnosis not present

## 2022-02-11 DIAGNOSIS — N183 Chronic kidney disease, stage 3 unspecified: Secondary | ICD-10-CM | POA: Diagnosis not present

## 2022-02-11 DIAGNOSIS — I714 Abdominal aortic aneurysm, without rupture, unspecified: Secondary | ICD-10-CM | POA: Diagnosis not present

## 2022-02-11 DIAGNOSIS — I5022 Chronic systolic (congestive) heart failure: Secondary | ICD-10-CM | POA: Diagnosis not present

## 2022-02-11 DIAGNOSIS — I129 Hypertensive chronic kidney disease with stage 1 through stage 4 chronic kidney disease, or unspecified chronic kidney disease: Secondary | ICD-10-CM | POA: Diagnosis not present

## 2022-02-11 DIAGNOSIS — M79672 Pain in left foot: Secondary | ICD-10-CM | POA: Diagnosis not present

## 2022-02-11 DIAGNOSIS — Z87891 Personal history of nicotine dependence: Secondary | ICD-10-CM | POA: Diagnosis not present

## 2022-02-11 DIAGNOSIS — Z0000 Encounter for general adult medical examination without abnormal findings: Secondary | ICD-10-CM | POA: Diagnosis not present

## 2022-02-12 DIAGNOSIS — R8290 Unspecified abnormal findings in urine: Secondary | ICD-10-CM | POA: Diagnosis not present

## 2022-02-12 NOTE — Progress Notes (Deleted)
?   Patient ID: John [name], male    DOB: 11-12-1946, 76 y.o.   MRN: [id number] ? ?HPI ? ?John [name] is a 76 y/o male with a history of ? ?Echo report from 01/29/22 reviewed and showed an EF of 55-60% along with moderately elevated PA pressure and mild/moderate TR.  ? ?Admitted 01/28/22 due to acute hypoxemic respiratory failure w/generalized swelling and anasarca w/hypotension. Started dopamine infusion to improve blood pressure and started on Lasix gtt. The patient was placed on BIPAP to improve his oxygen saturation. Cardiology  and nephrology consults obtained. Lasix gtt stopped and transitioned to oral diuretics. PT/OT evaluations done. Discharged after 9 days.  ? ?He presents today for his initial visit with a chief complaint of ? ?Review of Systems ? ? ? ?Physical Exam ? ?Assessment & Plan: ? ?1: Chronic heart failure with preserved ejection fraction- ?- NYHA class  ?- BNP 01/28/22 was 968.0 ? ?2: HTN with CKD- ?- BP ?- saw PCP (Hande) 02/11/22 ?- saw nephrology Holley Raring) 01/17/22 ?- BMP 02/11/22 reviewed and showed sodium 140, potassium 4.0, creatinine 3.5 and GFR 21 ? ?3: COPD- ? ? ?

## 2022-02-14 ENCOUNTER — Ambulatory Visit: Payer: Medicare HMO | Admitting: Family

## 2022-02-15 ENCOUNTER — Ambulatory Visit: Payer: Medicare HMO | Admitting: Family

## 2022-02-19 DIAGNOSIS — N179 Acute kidney failure, unspecified: Secondary | ICD-10-CM | POA: Diagnosis not present

## 2022-02-19 DIAGNOSIS — R600 Localized edema: Secondary | ICD-10-CM | POA: Diagnosis not present

## 2022-02-19 DIAGNOSIS — I10 Essential (primary) hypertension: Secondary | ICD-10-CM | POA: Diagnosis not present

## 2022-02-19 DIAGNOSIS — N1832 Chronic kidney disease, stage 3b: Secondary | ICD-10-CM | POA: Diagnosis not present

## 2022-02-19 DIAGNOSIS — N2581 Secondary hyperparathyroidism of renal origin: Secondary | ICD-10-CM | POA: Diagnosis not present

## 2022-02-19 DIAGNOSIS — R809 Proteinuria, unspecified: Secondary | ICD-10-CM | POA: Diagnosis not present

## 2022-02-24 NOTE — Progress Notes (Deleted)
Patient: John [name]   DOB:  September 15, 2046   MRN:  [id number] ? ?76 yo male with hx of CHF, CAD, COPD, CKD IIIa, former smoker and HTN. ? ?Admitted 2/13-2/22/23 with with acute hypoxemic respiratory failure w/generalized swelling and anasarca w/hypotension. Patient presented to outpatient clinic on 2/13 for scheduled lasix therapy but was unable to receive due to hypotension w/systolics in the 54O. He required pressor support, BiPAP and aggressive IV diuresis before being discharged back home. ? ?Echo report from 01/29/22 reviewed and showed: Left ventricular ejection fraction, by estimation, is 55 to 60%, normal function, no LVH, no regional wall abnormalities. Right ventricle is severely enlarged with elevated PA pressures. Right atrium moderately dilated.  ? ?He presents to the heart failure clinic for his initial visit with a chief complaint of ? ?Past Medical History:  ?Diagnosis Date  ? CHF (congestive heart failure) ([place])   ? EF 45% in 2021  ? COPD (chronic obstructive pulmonary disease) ([place]) 02/16/2020  ? Hypertension   ? ?Past Surgical History:  ?Procedure Laterality Date  ? CATARACT EXTRACTION W/PHACO Left 10/31/2021  ? Procedure: CATARACT EXTRACTION PHACO AND INTRAOCULAR LENS PLACEMENT ([place]) LEFT VISION BLUE 3.45 00:48.0;  Surgeon: Leandrew Koyanagi, MD;  Location: [place];  Service: Ophthalmology;  Laterality: Left;  ? CATARACT EXTRACTION W/PHACO Right 11/14/2021  ? Procedure: CATARACT EXTRACTION PHACO AND INTRAOCULAR LENS PLACEMENT ([facility]) RIGHT 8.59 01:10.5;  Surgeon: Leandrew Koyanagi, MD;  Location: [place];  Service: Ophthalmology;  Laterality: Right;  ? INGUINAL HERNIA REPAIR Right 02/04/2017  ? Procedure: HERNIA REPAIR INGUINAL ADULT;  Surgeon: Leonie Green, MD;  Location: [facility];  Service: General;  Laterality: Right;  ? NO PAST SURGERIES    ? QUADRICEPS TENDON REPAIR Left 02/15/2020  ? Procedure: REPAIR QUADRICEP TENDON;  Surgeon: Corky Mull, MD;  Location: [facility]  [facility];  Service: Orthopedics;  Laterality: Left;  ? ?Family History  ?Problem Relation Age of Onset  ? Diabetes Mother   ? Diabetes Brother   ? Diabetes Maternal Grandmother   ? Diabetes Paternal Grandmother   ? ?Social History  ? ?Tobacco Use  ? Smoking status: Former  ?  Packs/day: 0.25  ?  Years: 53.00  ?  Pack years: 13.25  ?  Types: Cigarettes  ?  Quit date: 02/15/2015  ?  Years since quitting: 7.0  ? Smokeless tobacco: Never  ?Substance Use Topics  ? Alcohol use: Yes  ?  Comment: occassional  ? ?No Known Allergies ?Prior to Admission medications   ?Medication Sig Start Date End Date Taking? Authorizing Provider  ?aspirin EC 325 MG tablet Take 1 tablet (325 mg total) by mouth daily. 02/16/20   Lattie Corns, PA-C  ?losartan (COZAAR) 100 MG tablet Take 100 mg by mouth daily. 06/13/20   [provider]  ?midodrine (PROAMATINE) 5 MG tablet Take 1 tablet (5 mg total) by mouth 3 (three) times daily with meals. 02/06/22 03/08/22  Sidney Ace, MD  ?torsemide (DEMADEX) 100 MG tablet Take 1 tablet (100 mg total) by mouth daily. Take 1 tablet (100 mg total) by mouth daily 02/06/22 03/08/22  Sidney Ace, MD  ? ROS  ?Physical Exam  ?Lab Results  ?Component Value Date  ? CREATININE 1.80 (H) 02/06/2022  ? CREATININE 1.57 (H) 02/05/2022  ? CREATININE 1.60 (H) 02/04/2022  ? There were no vitals filed for this visit.  ?Vitals with BMI 02/06/2022 02/06/2022 02/06/2022  ?Height - - -  ?Weight - - 209 lbs  ?BMI - -  29.99  ?Systolic 300 923 300  ?Diastolic 89 85 77  ?Pulse 94 89 91  ?Some encounter information is confidential and restricted. Go to Review Flowsheets activity to see all data.  ?  ?Assessment and Plan: ?Heart failure with mildly reduced ejection fraction ?- NYHA ?- ?- on Cozaar 100 mg QD ?- BNP 01/28/22 968 ? ?2. HTN ?- BP today ?- Saw PCP (Hande) 02/11/22 ?- BMP reviewed from 02/19/22, Na 142, K 3.3, Cr 2.08, GFR 32 ? ?3. CKD IIIb ?- Saw nephrologist Holley Raring) 02/19/22 ?- metolazone 5 mg MWF started,  torsemide 100 mg QD ? ?4. COPD ?- needs home O2, questionable if he has it or not ? ?Return in  ? ? ? ? ? ? ?

## 2022-02-25 ENCOUNTER — Ambulatory Visit (HOSPITAL_BASED_OUTPATIENT_CLINIC_OR_DEPARTMENT_OTHER): Payer: Medicare HMO | Admitting: Family

## 2022-02-25 ENCOUNTER — Other Ambulatory Visit
Admission: RE | Admit: 2022-02-25 | Discharge: 2022-02-25 | Disposition: A | Discharge status: Home / Self Care | Payer: Medicare HMO | Source: Ambulatory Visit | Attending: Family | Admitting: Family

## 2022-02-25 ENCOUNTER — Other Ambulatory Visit: Payer: Self-pay

## 2022-02-25 ENCOUNTER — Encounter: Payer: Self-pay | Admitting: Family

## 2022-02-25 VITALS — BP 91/70 | HR 107 | Resp 18 | Ht 70.0 in | Wt 203.2 lb

## 2022-02-25 DIAGNOSIS — N184 Chronic kidney disease, stage 4 (severe): Secondary | ICD-10-CM | POA: Diagnosis not present

## 2022-02-25 DIAGNOSIS — Z9981 Dependence on supplemental oxygen: Secondary | ICD-10-CM | POA: Insufficient documentation

## 2022-02-25 DIAGNOSIS — I5043 Acute on chronic combined systolic (congestive) and diastolic (congestive) heart failure: Secondary | ICD-10-CM | POA: Diagnosis not present

## 2022-02-25 DIAGNOSIS — I890 Lymphedema, not elsewhere classified: Secondary | ICD-10-CM | POA: Insufficient documentation

## 2022-02-25 DIAGNOSIS — J441 Chronic obstructive pulmonary disease with (acute) exacerbation: Secondary | ICD-10-CM | POA: Diagnosis not present

## 2022-02-25 DIAGNOSIS — I5032 Chronic diastolic (congestive) heart failure: Secondary | ICD-10-CM | POA: Insufficient documentation

## 2022-02-25 DIAGNOSIS — J9601 Acute respiratory failure with hypoxia: Secondary | ICD-10-CM | POA: Diagnosis not present

## 2022-02-25 DIAGNOSIS — J449 Chronic obstructive pulmonary disease, unspecified: Secondary | ICD-10-CM | POA: Insufficient documentation

## 2022-02-25 DIAGNOSIS — I130 Hypertensive heart and chronic kidney disease with heart failure and stage 1 through stage 4 chronic kidney disease, or unspecified chronic kidney disease: Secondary | ICD-10-CM | POA: Insufficient documentation

## 2022-02-25 DIAGNOSIS — I2609 Other pulmonary embolism with acute cor pulmonale: Secondary | ICD-10-CM | POA: Diagnosis not present

## 2022-02-25 DIAGNOSIS — Z87891 Personal history of nicotine dependence: Secondary | ICD-10-CM | POA: Insufficient documentation

## 2022-02-25 DIAGNOSIS — E785 Hyperlipidemia, unspecified: Secondary | ICD-10-CM | POA: Insufficient documentation

## 2022-02-25 DIAGNOSIS — G479 Sleep disorder, unspecified: Secondary | ICD-10-CM | POA: Insufficient documentation

## 2022-02-25 DIAGNOSIS — I10 Essential (primary) hypertension: Secondary | ICD-10-CM | POA: Diagnosis not present

## 2022-02-25 DIAGNOSIS — J9602 Acute respiratory failure with hypercapnia: Secondary | ICD-10-CM | POA: Diagnosis not present

## 2022-02-25 DIAGNOSIS — N189 Chronic kidney disease, unspecified: Secondary | ICD-10-CM | POA: Insufficient documentation

## 2022-02-25 DIAGNOSIS — Z79899 Other long term (current) drug therapy: Secondary | ICD-10-CM | POA: Insufficient documentation

## 2022-02-25 LAB — BASIC METABOLIC PANEL
Anion gap: 16 — ABNORMAL HIGH (ref 5–15)
BUN: 40 mg/dL — ABNORMAL HIGH (ref 8–23)
CO2: 37 mmol/L — ABNORMAL HIGH (ref 22–32)
Calcium: 9.6 mg/dL (ref 8.9–10.3)
Chloride: 86 mmol/L — ABNORMAL LOW (ref 98–111)
Creatinine, Ser: 2.13 mg/dL — ABNORMAL HIGH (ref 0.61–1.24)
GFR, Estimated: 31 mL/min — ABNORMAL LOW (ref >60)
Glucose, Bld: 89 mg/dL (ref 70–99)
Potassium: 3.1 mmol/L — ABNORMAL LOW (ref 3.5–5.1)
Sodium: 139 mmol/L (ref 135–145)

## 2022-02-25 NOTE — Progress Notes (Signed)
Patient ID: John [name], male    DOB: 15-Apr-1946, 76 y.o.   MRN: [id number]   John [name] is a 76 y/o male with a history of HTN, CKD, COPD, hyperlipidemia, remote tobacco use and chronic heart failure.   Echo report from 01/29/22 reviewed and showed an EF of 55-60% along with moderately elevated PA pressure and mild/moderate TR.   Admitted 01/28/22 due to acute hypoxemic respiratory failure w/generalized swelling and anasarca w/hypotension. Started dopamine infusion to improve blood pressure and started on Lasix gtt. The patient was placed on BIPAP to improve his oxygen saturation. Cardiology  and nephrology consults obtained. Lasix gtt stopped and transitioned to oral diuretics. PT/OT evaluations done. Discharged after 9 days.   He presents today for his initial visit with a chief complaint of minimal shortness of breath upon moderate exertion. He describes this as chronic in nature although says it only occurs when he "over does it". He has associated decreased appetite, cough, pedal edema (improving) and difficulty sleeping due to waking up often to urinate. He denies any wheezing, chest pain, palpitations, abdominal distention, dizziness or weight gain.   Weighing daily at home and has lost 8 pounds over the last 3 days since taking 1 dose of metolazone. He says that nephrology has him taking it on M, W, F and he's concerned about this weight loss with just one dose. He is planning on calling nephrology after leaving here to update them on this.   Has oxygen at home but wears it as 2L PRN if he needs it. When he first arrived to the office, his room air sat was 77% but when he removed his mask, it quickly rose to 93%.   Not adding salt to his food and his wife does all the cooking. Does eat out ~ once a week and is aware that he's getting more salt in his meal when he eats out. Nephrology has him on a fluid restriction of 32-40 ounces/ daily. Currently on allopurinol but says that he's not  been told that he had gout.   (Of note, he showed up at 7am for his 9am appt, adamant his appt was at 7am so he was seen since he was here)  Past Medical History:  Diagnosis Date   CHF (congestive heart failure) ([facility])    EF 45% in 2021   Chronic kidney disease    COPD (chronic obstructive pulmonary disease) ([place]) 02/16/2020   Hyperlipidemia    Hypertension     Past Surgical History:  Procedure Laterality Date   CATARACT EXTRACTION W/PHACO Left 10/31/2021   Procedure: CATARACT EXTRACTION PHACO AND INTRAOCULAR LENS PLACEMENT ([place]) LEFT VISION BLUE 3.45 00:48.0;  Surgeon: Leandrew Koyanagi, MD;  Location: [place];  Service: Ophthalmology;  Laterality: Left;   CATARACT EXTRACTION W/PHACO Right 11/14/2021   Procedure: CATARACT EXTRACTION PHACO AND INTRAOCULAR LENS PLACEMENT ([facility]) RIGHT 8.59 01:10.5;  Surgeon: Leandrew Koyanagi, MD;  Location: [place];  Service: Ophthalmology;  Laterality: Right;   INGUINAL HERNIA REPAIR Right 02/04/2017   Procedure: HERNIA REPAIR INGUINAL ADULT;  Surgeon: Leonie Green, MD;  Location: [facility];  Service: General;  Laterality: Right;   NO PAST SURGERIES     QUADRICEPS TENDON REPAIR Left 02/15/2020   Procedure: REPAIR QUADRICEP TENDON;  Surgeon: Corky Mull, MD;  Location: [facility];  Service: Orthopedics;  Laterality: Left;   Family History  Problem Relation Age of Onset   Diabetes Mother    Diabetes Brother    Diabetes  Maternal Grandmother    Diabetes Paternal Grandmother    Social History   Tobacco Use   Smoking status: Former    Packs/day: 0.25    Years: 53.00    Pack years: 13.25    Types: Cigarettes    Quit date: 02/15/2015    Years since quitting: 7.0   Smokeless tobacco: Never  Substance Use Topics   Alcohol use: Yes    Comment: occassional   No Known Allergies  Prior to Admission medications   Medication Sig Start Date End Date Taking? Authorizing Provider  allopurinol (ZYLOPRIM) 100 MG tablet Take  100 mg by mouth daily.   Yes [provider]  aspirin EC 325 MG tablet Take 1 tablet (325 mg total) by mouth daily. 02/16/20  Yes Lattie Corns, PA-C  losartan (COZAAR) 100 MG tablet Take 100 mg by mouth daily. 06/13/20  Yes [provider]  metolazone (ZAROXOLYN) 5 MG tablet Take 5 mg by mouth 3 (three) times a week. On M, W, F   Yes [provider]  midodrine (PROAMATINE) 5 MG tablet Take 1 tablet (5 mg total) by mouth 3 (three) times daily with meals. 02/06/22 03/08/22 Yes Sreenath, Sudheer B, MD  torsemide (DEMADEX) 100 MG tablet Take 1 tablet (100 mg total) by mouth daily. Take 1 tablet (100 mg total) by mouth daily 02/06/22 03/08/22 Yes Sreenath, Sudheer B, MD   Review of Systems  Constitutional:  Positive for appetite change (decreased). Negative for fatigue.  HENT:  Negative for congestion, postnasal drip and sore throat.   Eyes: Negative.   Respiratory:  Positive for cough and shortness of breath (with extreme exertion). Negative for wheezing.   Cardiovascular:  Positive for leg swelling ("improving"). Negative for chest pain and palpitations.  Gastrointestinal:  Negative for abdominal distention and abdominal pain.  Endocrine: Negative.   Genitourinary:        Nocturia  Musculoskeletal:  Negative for back pain and neck pain.  Skin: Negative.   Allergic/Immunologic: Negative.   Neurological:  Negative for dizziness and light-headedness.  Hematological:  Negative for adenopathy. Does not bruise/bleed easily.  Psychiatric/Behavioral:  Positive for sleep disturbance (waking up frequently due to urination since metolazone). Negative for dysphoric mood. The patient is not nervous/anxious.    Vitals:   02/25/22 0723  BP: 91/70  Pulse: (!) 107  Resp: 18  SpO2: 93%  Weight: 203 lb 4 oz (92.2 kg)  Height: '5\' 10"'$  (1.778 m)   Wt Readings from Last 3 Encounters:  02/25/22 203 lb 4 oz (92.2 kg)  02/06/22 208 lb 15.9 oz (94.8 kg)  01/11/22 229 lb 4.8 oz (104  kg)   Lab Results  Component Value Date   CREATININE 1.80 (H) 02/06/2022   CREATININE 1.57 (H) 02/05/2022   CREATININE 1.60 (H) 02/04/2022   Physical Exam Vitals and nursing note reviewed.  Constitutional:      Appearance: Normal appearance.  HENT:     Head: Normocephalic and atraumatic.  Cardiovascular:     Rate and Rhythm: Regular rhythm. Tachycardia present.  Pulmonary:     Effort: Pulmonary effort is normal. No respiratory distress.     Breath sounds: No wheezing or rales.  Abdominal:     General: There is no distension.     Palpations: Abdomen is soft.  Musculoskeletal:        General: No tenderness.     Cervical back: Normal range of motion and neck supple.     Right lower leg: Edema (2+ pitting)  present.     Left lower leg: Edema (2+ pitting) present.  Skin:    General: Skin is warm and dry.  Neurological:     General: No focal deficit present.     Mental Status: He is alert and oriented to person, place, and time.  Psychiatric:        Mood and Affect: Mood normal.        Behavior: Behavior normal.        Thought Content: Thought content normal.    Assessment & Plan:  1: Chronic heart failure with preserved ejection fraction without structural changes- - NYHA class II - euvolemic today - weighing daily and understands to call for an overnight weight gain of > 2 pounds or a weekly weight gain of > 5 pounds - not adding salt to his food but his wife does the cooking; he declined the low sodium cookbook; does eat out ~ once/ week and is aware that he's getting more sodium in that meal and tries to adjust his other meals accordingly - fluid restriction of 32-40 ounces daily per nephrology - currently taking metolazone '5mg'$  3 times/ week; first does on 3/10 with 8 pound weight loss; took 2nd dose today but is going to call Dr. Holley Raring to ask how much weight loss he should be concerned with - will check BMP today as he's currently not on any potassium supplement - BNP  01/28/22 was 968.0  2: HTN with CKD- - BP low today (91/70) but without any dizziness; just started metolazone 3 days ago - saw PCP (Hande) 02/11/22 - saw nephrology Holley Raring) 01/17/22 - BMP 02/19/22 reviewed and showed sodium 142, potassium 3.3, creatinine 2.08 and GFR 32  3: COPD- - has oxygen at home that he wears at 2L PRN - initial sat here was 77% but quickly rose to 93% upon removal of mask - quit smoking ~ 10 years ago  4: Lymphedema- - stage 2 - quite active and able to walk long distances - recently had metolazone started - to try and elevate legs; consider compression socks - consider compression boots in the future if needed   Medication bottles reviewed.   Return in 1 month, sooner if needed.

## 2022-02-25 NOTE — Patient Instructions (Signed)
Continue weighing daily and call for an overnight weight gain of 3 pounds or more or a weekly weight gain of more than 5 pounds.   If you have voicemail, please make sure your mailbox is cleaned out so that we may leave a message and please make sure to listen to any voicemails.     

## 2022-02-26 ENCOUNTER — Telehealth: Payer: Self-pay

## 2022-02-26 ENCOUNTER — Other Ambulatory Visit: Payer: Self-pay | Admitting: Family

## 2022-02-26 MED ORDER — POTASSIUM CHLORIDE CRYS ER 20 MEQ PO TBCR: 20.0000 meq | EXTENDED_RELEASE_TABLET | Freq: Every day | ORAL | 5 refills | Status: DC

## 2022-02-26 NOTE — Progress Notes (Signed)
Due to recent metolazone use by nephrology and based on most recent lab results will begin potassium 53mq daily. Lab results forwarded to nephrology.  ?

## 2022-02-26 NOTE — Telephone Encounter (Addendum)
Patient called with results below and informed that potassium prescription has been sent to pharmacy and to start taking 1 tablet a day, per provider instructions. Patient verbalized understanding and stated he will begin potassium tablets today. Pt had no further questions or concerns at this time.  ?Georg Ruddle, RN ?----- Message from Alisa Graff, [place] sent at 02/26/2022  8:27 AM EDT ----- ?Potassium is a little low due to metolazone that the kidney doctor prescribed. Will start 1 potassium tablet daily and results were also forwarded to kidney doctor.  ?

## 2022-02-27 DIAGNOSIS — I2609 Other pulmonary embolism with acute cor pulmonale: Secondary | ICD-10-CM | POA: Diagnosis not present

## 2022-02-27 DIAGNOSIS — I130 Hypertensive heart and chronic kidney disease with heart failure and stage 1 through stage 4 chronic kidney disease, or unspecified chronic kidney disease: Secondary | ICD-10-CM | POA: Diagnosis not present

## 2022-02-27 DIAGNOSIS — J9602 Acute respiratory failure with hypercapnia: Secondary | ICD-10-CM | POA: Diagnosis not present

## 2022-02-27 DIAGNOSIS — J441 Chronic obstructive pulmonary disease with (acute) exacerbation: Secondary | ICD-10-CM | POA: Diagnosis not present

## 2022-02-27 DIAGNOSIS — I5043 Acute on chronic combined systolic (congestive) and diastolic (congestive) heart failure: Secondary | ICD-10-CM | POA: Diagnosis not present

## 2022-02-27 DIAGNOSIS — N184 Chronic kidney disease, stage 4 (severe): Secondary | ICD-10-CM | POA: Diagnosis not present

## 2022-02-27 DIAGNOSIS — J9601 Acute respiratory failure with hypoxia: Secondary | ICD-10-CM | POA: Diagnosis not present

## 2022-03-14 DIAGNOSIS — R600 Localized edema: Secondary | ICD-10-CM | POA: Diagnosis not present

## 2022-03-14 DIAGNOSIS — I5022 Chronic systolic (congestive) heart failure: Secondary | ICD-10-CM | POA: Diagnosis not present

## 2022-03-14 DIAGNOSIS — R809 Proteinuria, unspecified: Secondary | ICD-10-CM | POA: Diagnosis not present

## 2022-03-14 DIAGNOSIS — I10 Essential (primary) hypertension: Secondary | ICD-10-CM | POA: Diagnosis not present

## 2022-03-14 DIAGNOSIS — N1832 Chronic kidney disease, stage 3b: Secondary | ICD-10-CM | POA: Diagnosis not present

## 2022-03-14 DIAGNOSIS — N2581 Secondary hyperparathyroidism of renal origin: Secondary | ICD-10-CM | POA: Diagnosis not present

## 2022-03-28 ENCOUNTER — Ambulatory Visit: Payer: Medicare HMO | Attending: Family | Admitting: Family

## 2022-03-28 ENCOUNTER — Encounter: Payer: Self-pay | Admitting: Family

## 2022-03-28 VITALS — BP 99/82 | HR 74 | Resp 20 | Ht 70.0 in | Wt 192.5 lb

## 2022-03-28 DIAGNOSIS — J449 Chronic obstructive pulmonary disease, unspecified: Secondary | ICD-10-CM | POA: Diagnosis not present

## 2022-03-28 DIAGNOSIS — I890 Lymphedema, not elsewhere classified: Secondary | ICD-10-CM | POA: Diagnosis not present

## 2022-03-28 DIAGNOSIS — I5032 Chronic diastolic (congestive) heart failure: Secondary | ICD-10-CM

## 2022-03-28 DIAGNOSIS — Z87891 Personal history of nicotine dependence: Secondary | ICD-10-CM | POA: Diagnosis not present

## 2022-03-28 DIAGNOSIS — I10 Essential (primary) hypertension: Secondary | ICD-10-CM

## 2022-03-28 DIAGNOSIS — E785 Hyperlipidemia, unspecified: Secondary | ICD-10-CM | POA: Diagnosis not present

## 2022-03-28 DIAGNOSIS — Z9981 Dependence on supplemental oxygen: Secondary | ICD-10-CM | POA: Insufficient documentation

## 2022-03-28 DIAGNOSIS — Z79899 Other long term (current) drug therapy: Secondary | ICD-10-CM | POA: Diagnosis not present

## 2022-03-28 DIAGNOSIS — Z7982 Long term (current) use of aspirin: Secondary | ICD-10-CM | POA: Diagnosis not present

## 2022-03-28 DIAGNOSIS — I130 Hypertensive heart and chronic kidney disease with heart failure and stage 1 through stage 4 chronic kidney disease, or unspecified chronic kidney disease: Secondary | ICD-10-CM | POA: Insufficient documentation

## 2022-03-28 DIAGNOSIS — N189 Chronic kidney disease, unspecified: Secondary | ICD-10-CM | POA: Insufficient documentation

## 2022-03-28 MED ORDER — ASPIRIN EC 81 MG PO TBEC: 81.0000 mg | DELAYED_RELEASE_TABLET | Freq: Every day | ORAL | 3 refills | Status: DC

## 2022-03-28 NOTE — Patient Instructions (Addendum)
Continue weighing daily and call for an overnight weight gain of 3 pounds or more or a weekly weight gain of more than 5 pounds. ? ? ?If you have voicemail, please make sure your mailbox is cleaned out so that we may leave a message and please make sure to listen to any voicemails.  ? ? ?Decrease your aspirin to a baby aspirin '81mg'$  daily.  ?

## 2022-03-28 NOTE — Progress Notes (Signed)
? Patient ID: John [name], male    DOB: [birth year], 76 y.o.   MRN: [id number] ? ? ?John [name] is a 76 y/o male with a history of HTN, CKD, COPD, hyperlipidemia, remote tobacco use and chronic heart failure.  ? ?Echo report from 01/29/22 reviewed and showed an EF of 55-60% along with moderately elevated PA pressure and mild/moderate TR.  ? ?Admitted 01/28/22 due to acute hypoxemic respiratory failure w/generalized swelling and anasarca w/hypotension. Started dopamine infusion to improve blood pressure and started on Lasix gtt. The patient was placed on BIPAP to improve his oxygen saturation. Cardiology  and nephrology consults obtained. Lasix gtt stopped and transitioned to oral diuretics. PT/OT evaluations done. Discharged after 9 days.  ? ?He presents today for a follow-up visit with a chief complaint of productive cough. He describes this as chronic and says it can range in color from white to yellow. Tends to be worse in the mornings. Denies any other symptoms and specifically denies any difficulty sleeping, dizziness, abdominal distention, palpitations, pedal edema, chest pain, shortness of breath, fatigue or weight gain.  ? ?Has recently had his metolazone decreased by nephrology to twice weekly (was 3 times/ week) due to worsening renal function and continued weight loss.  ? ?Patient goes to the gym 5 days/ week and lifts weights to try and keep up his muscle tone.  ? ?Has been taking his '325mg'$  aspirin QOD because he was concerned about possible stomach bleeding with long-term use.  ? ?Past Medical History:  ?Diagnosis Date  ? CHF (congestive heart failure) ([facility])   ? EF 45% in 2021  ? Chronic kidney disease   ? COPD (chronic obstructive pulmonary disease) ([facility]) 02/16/2020  ? Hyperlipidemia   ? Hypertension   ? Pneumonia 2021  ? ? ?Past Surgical History:  ?Procedure Laterality Date  ? CATARACT EXTRACTION W/PHACO Left 10/31/2021  ? Procedure: CATARACT EXTRACTION PHACO AND INTRAOCULAR LENS PLACEMENT ([place]) LEFT  VISION BLUE 3.45 00:48.0;  Surgeon: Leandrew Koyanagi, MD;  Location: [facility];  Service: Ophthalmology;  Laterality: Left;  ? CATARACT EXTRACTION W/PHACO Right 11/14/2021  ? Procedure: CATARACT EXTRACTION PHACO AND INTRAOCULAR LENS PLACEMENT ([facility]) RIGHT 8.59 01:10.5;  Surgeon: Leandrew Koyanagi, MD;  Location: [facility];  Service: Ophthalmology;  Laterality: Right;  ? INGUINAL HERNIA REPAIR Right 02/04/2017  ? Procedure: HERNIA REPAIR INGUINAL ADULT;  Surgeon: Leonie Green, MD;  Location: [facility];  Service: General;  Laterality: Right;  ? NO PAST SURGERIES    ? QUADRICEPS TENDON REPAIR Left 02/15/2020  ? Procedure: REPAIR QUADRICEP TENDON;  Surgeon: Corky Mull, MD;  Location: [facility];  Service: Orthopedics;  Laterality: Left;  ? ?Family History  ?Problem Relation Age of Onset  ? Diabetes Mother   ? Diabetes Brother   ? Diabetes Maternal Grandmother   ? Diabetes Paternal Grandmother   ? ?Social History  ? ?Tobacco Use  ? Smoking status: Former  ?  Packs/day: 0.25  ?  Years: 53.00  ?  Pack years: 13.25  ?  Types: Cigarettes  ?  Quit date: 02/15/2015  ?  Years since quitting: 7.1  ? Smokeless tobacco: Never  ?Substance Use Topics  ? Alcohol use: Yes  ?  Comment: occassional  ? ?No Known Allergies ? ?Prior to Admission medications   ?Medication Sig Start Date End Date Taking? Authorizing Provider  ?losartan (COZAAR) 100 MG tablet Take 100 mg by mouth daily. 06/13/20  Yes [provider]  ?metolazone (ZAROXOLYN) 5 MG tablet Take 5  mg by mouth 2 (two) times a week. On Mon and Fri   Yes [provider]  ?potassium chloride SA (KLOR-CON M) 20 MEQ tablet Take 1 tablet (20 mEq total) by mouth daily. 02/26/22  Yes Darylene Price A, FNP  ?allopurinol (ZYLOPRIM) 100 MG tablet Take 100 mg by mouth daily. ?Patient not taking: Reported on 03/28/2022    [provider]  ?aspirin EC 325 MG tablet Take 1 tablet (325 mg total) by mouth daily. ?Patient taking differently: Take  325 mg by mouth daily. (Pt taking every other day) 02/16/20   Lattie Corns, PA-C  ?torsemide (DEMADEX) 100 MG tablet Take 1 tablet (100 mg total) by mouth daily. Take 1 tablet (100 mg total) by mouth daily 02/06/22 03/08/22  Sidney Ace, MD  ? ? ?Review of Systems  ?Constitutional:  Negative for appetite change and fatigue.  ?HENT:  Negative for congestion, postnasal drip and sore throat.   ?Eyes: Negative.   ?Respiratory:  Positive for cough. Negative for shortness of breath and wheezing.   ?Cardiovascular:  Negative for chest pain, palpitations and leg swelling.  ?Gastrointestinal:  Negative for abdominal distention and abdominal pain.  ?Endocrine: Negative.   ?Genitourinary: Negative.   ?Musculoskeletal:  Negative for back pain and neck pain.  ?Skin: Negative.   ?Allergic/Immunologic: Negative.   ?Neurological:  Negative for dizziness and light-headedness.  ?Hematological:  Negative for adenopathy. Does not bruise/bleed easily.  ?Psychiatric/Behavioral:  Positive for sleep disturbance (chronic; sleeping on 2 pillows). Negative for dysphoric mood. The patient is not nervous/anxious.   ? ?Vitals:  ? 03/28/22 0829  ?BP: 99/82  ?Pulse: 74  ?Resp: 20  ?SpO2: 100%  ?Weight: 192 lb 8 oz (87.3 kg)  ?Height: '5\' 10"'$  (1.778 m)  ? ?Wt Readings from Last 3 Encounters:  ?03/28/22 192 lb 8 oz (87.3 kg)  ?02/25/22 203 lb 4 oz (92.2 kg)  ?02/06/22 208 lb 15.9 oz (94.8 kg)  ? ?Lab Results  ?Component Value Date  ? CREATININE 2.13 (H) 02/25/2022  ? CREATININE 1.80 (H) 02/06/2022  ? CREATININE 1.57 (H) 02/05/2022  ? ? ?Physical Exam ?Vitals and nursing note reviewed.  ?Constitutional:   ?   Appearance: Normal appearance.  ?HENT:  ?   Head: Normocephalic and atraumatic.  ?Cardiovascular:  ?   Rate and Rhythm: Normal rate and regular rhythm.  ?Pulmonary:  ?   Effort: Pulmonary effort is normal. No respiratory distress.  ?   Breath sounds: No wheezing or rales.  ?Abdominal:  ?   General: There is no distension.  ?    Palpations: Abdomen is soft.  ?Musculoskeletal:     ?   General: No tenderness.  ?   Cervical back: Normal range of motion and neck supple.  ?   Right lower leg: Edema (trace pitting) present.  ?   Left lower leg: No edema.  ?Skin: ?   General: Skin is warm and dry.  ?Neurological:  ?   General: No focal deficit present.  ?   Mental Status: He is alert and oriented to person, place, and time.  ?Psychiatric:     ?   Mood and Affect: Mood normal.     ?   Behavior: Behavior normal.     ?   Thought Content: Thought content normal.  ? ? ?Assessment & Plan: ? ?1: Chronic heart failure with preserved ejection fraction without structural changes- ?- NYHA class I ?- euvolemic today ?- weighing daily and understands to call for an overnight  weight gain of > 2 pounds or a weekly weight gain of > 5 pounds ?- weight down 11 pounds from last visit here 1 month ago ?- not adding salt to his food but his wife does the cooking ?- fluid restriction of 32-40 ounces daily per nephrology ?- consider adding SGLT2 ?- decrease aspirin to '81mg'$  daily ?- BNP 01/28/22 was 968.0 ?- PharmD reconciled medications with the patient ? ?2: HTN with CKD- ?- BP soft (99/82) but no dizziness, fatigue or blurry vision ?- saw PCP (Hande) 02/11/22 ?- saw nephrology Holley Raring) 03/14/22 ?- BMP 03/14/22 reviewed and showed sodium 138, potassium 3.1, creatinine 2.56 and GFR 25 ?- now taking metolazone on M & F instead of 3 days/ week ? ?3: COPD- ?- has oxygen at home that he wears at 2L PRN ?- quit smoking ~ 10 years ago ? ?4: Lymphedema- ?- resolved ? ? ?Patient did not bring his medications nor a list. Each medication was verbally reviewed with the patient and he was encouraged to bring the bottles to every visit to confirm accuracy of list.  ? ?Return in 4 months, sooner if needed.  ?

## 2022-04-11 DIAGNOSIS — H401132 Primary open-angle glaucoma, bilateral, moderate stage: Secondary | ICD-10-CM | POA: Diagnosis not present

## 2022-05-14 DIAGNOSIS — N2581 Secondary hyperparathyroidism of renal origin: Secondary | ICD-10-CM | POA: Diagnosis not present

## 2022-05-14 DIAGNOSIS — R809 Proteinuria, unspecified: Secondary | ICD-10-CM | POA: Diagnosis not present

## 2022-05-14 DIAGNOSIS — N1832 Chronic kidney disease, stage 3b: Secondary | ICD-10-CM | POA: Diagnosis not present

## 2022-05-14 DIAGNOSIS — R600 Localized edema: Secondary | ICD-10-CM | POA: Diagnosis not present

## 2022-05-14 DIAGNOSIS — I10 Essential (primary) hypertension: Secondary | ICD-10-CM | POA: Diagnosis not present

## 2022-05-14 DIAGNOSIS — N184 Chronic kidney disease, stage 4 (severe): Secondary | ICD-10-CM | POA: Diagnosis not present

## 2022-05-21 DIAGNOSIS — N2581 Secondary hyperparathyroidism of renal origin: Secondary | ICD-10-CM | POA: Diagnosis not present

## 2022-05-21 DIAGNOSIS — I10 Essential (primary) hypertension: Secondary | ICD-10-CM | POA: Diagnosis not present

## 2022-05-21 DIAGNOSIS — N1832 Chronic kidney disease, stage 3b: Secondary | ICD-10-CM | POA: Diagnosis not present

## 2022-05-21 DIAGNOSIS — R600 Localized edema: Secondary | ICD-10-CM | POA: Diagnosis not present

## 2022-05-21 DIAGNOSIS — R809 Proteinuria, unspecified: Secondary | ICD-10-CM | POA: Diagnosis not present

## 2022-07-03 DIAGNOSIS — R7309 Other abnormal glucose: Secondary | ICD-10-CM | POA: Diagnosis not present

## 2022-07-03 DIAGNOSIS — I10 Essential (primary) hypertension: Secondary | ICD-10-CM | POA: Diagnosis not present

## 2022-07-03 DIAGNOSIS — Z87891 Personal history of nicotine dependence: Secondary | ICD-10-CM | POA: Diagnosis not present

## 2022-07-03 DIAGNOSIS — I130 Hypertensive heart and chronic kidney disease with heart failure and stage 1 through stage 4 chronic kidney disease, or unspecified chronic kidney disease: Secondary | ICD-10-CM | POA: Diagnosis not present

## 2022-07-03 DIAGNOSIS — I700 Atherosclerosis of aorta: Secondary | ICD-10-CM | POA: Diagnosis not present

## 2022-07-03 DIAGNOSIS — J449 Chronic obstructive pulmonary disease, unspecified: Secondary | ICD-10-CM | POA: Diagnosis not present

## 2022-07-03 DIAGNOSIS — I5032 Chronic diastolic (congestive) heart failure: Secondary | ICD-10-CM | POA: Diagnosis not present

## 2022-07-03 DIAGNOSIS — I7121 Aneurysm of the ascending aorta, without rupture: Secondary | ICD-10-CM | POA: Diagnosis not present

## 2022-07-03 DIAGNOSIS — I5022 Chronic systolic (congestive) heart failure: Secondary | ICD-10-CM | POA: Diagnosis not present

## 2022-07-17 ENCOUNTER — Ambulatory Visit: Payer: Self-pay

## 2022-07-17 NOTE — Patient Instructions (Signed)
Visit Information  Thank you for taking time to visit with me today. Please don't hesitate to contact me if I can be of assistance to you.   Following are the goals we discussed today:   Goals Addressed             This Visit's Progress    RNCM: Effective Management of CHF       Care Coordination Interventions:  BP Readings from Last 3 Encounters:  03/28/22 99/82  02/25/22 91/70  02/06/22 108/89    Wt Readings from Last 3 Encounters:  03/28/22 192 lb 8 oz (87.3 kg)  02/25/22 203 lb 4 oz (92.2 kg)  02/06/22 208 lb 15.9 oz (94.8 kg)   Stated weight today was 193 (07-17-2022) Basic overview and discussion of pathophysiology of Heart Failure reviewed. 07-17-2022: The patient has a good understanding of heart failure. Has had 2 hospitalizations in the past for fluid overload.  Provided education on low sodium diet. 07-17-2022. Education and support given. The patient knows to watch his sodium. He can gauge how well he is doing by monitoring weight daily. States with his kidney problems and heart problems he has to be careful about his fluid intake. He states he gets thirsty easily and likes to drink water but that can be bad for him. Recommendation made to try a cup of ice and when he feels thirsty to suck on some ice cubes instead of drinking water. Will continue to monitor for changes.  Reviewed Heart Failure Action Plan in depth and provided written copy. 07-17-2022: The patient has parameters to call his providers for weight greater than 195. Usual readings are 193 to 195. One day this week his weight was 198 and he knew to take his additional fluid pill as prescribed by his nephrologist.  Assessed need for readable accurate scales in home. 07-17-2022: Scales in home and used daily Provided education about placing scale on hard, flat surface Advised patient to weigh each morning after emptying bladder Discussed importance of daily weight and advised patient to weigh and record daily Reviewed  role of diuretics in prevention of fluid overload and management of heart failure. 07-17-2022: No issues with medications and patient also takes a potassium supplement; Discussed the importance of keeping all appointments with provider. 07-17-2022: The patient is compliant with provider appointments. Last seen by pcp on 07-03-2022 Provided patient with education about the role of exercise in the management of heart failure. 07-17-2022: Review of exercise habits. The patient plays tennis 4 times a week if his schedule allows, [facility] [place] [facility] and is [place] an hour each day he goes, usually her goes 3 to 4 times a week. The patient likes to stay active Reviewed with the patient resources available if needed in the future for SDOH or other needs Advised patient to discuss changes in weight, questions or concerns about heart failure or heart health with provider Screening for signs and symptoms of depression related to chronic disease state  Assessed social determinant of health barriers  EF is 45 to 50%           Our next appointment is by telephone on 09-26-2022 at 0900 am  Please call the care guide team at [phone] if you need to cancel or reschedule your appointment.   If you are experiencing a Mental Health or [place] or need someone to talk to, please call the Suicide and Crisis Lifeline: 988 call the [place] National Suicide Prevention Lifeline: (214)470-1464  or TTY: 812 303 0894 TTY 559-854-2920) to talk to a trained counselor call 1-800-273-TALK (toll free, 24 hour hotline)  The patient verbalized understanding of instructions, educational materials, and care plan provided today and DECLINED offer to receive copy of patient instructions, educational materials, and care plan.   Telephone follow up appointment with care management team member scheduled for: 09-26-2022 at 0900 am  Newton Hamilton, MSN, [place]  Network Mobile: [phone]

## 2022-07-17 NOTE — Patient Outreach (Signed)
Care Coordination   Initial Visit Note   07/17/2022 Name: John [name] MRN: [id number] DOB: 1946-10-06  John [name] is a 76 y.o. year old male who sees Tracie Harrier, MD for primary care. I spoke with  [name] by phone today  What matters to the patients health and wellness today?  The patient keeping his weight managed and not having to go back to the hospital    Goals Addressed             This Visit's Progress    RNCM: Effective Management of CHF       Care Coordination Interventions:  BP Readings from Last 3 Encounters:  03/28/22 99/82  02/25/22 91/70  02/06/22 108/89    Wt Readings from Last 3 Encounters:  03/28/22 192 lb 8 oz (87.3 kg)  02/25/22 203 lb 4 oz (92.2 kg)  02/06/22 208 lb 15.9 oz (94.8 kg)   Stated weight today was 193 (07-17-2022) Basic overview and discussion of pathophysiology of Heart Failure reviewed. 07-17-2022: The patient has a good understanding of heart failure. Has had 2 hospitalizations in the past for fluid overload.  Provided education on low sodium diet. 07-17-2022. Education and support given. The patient knows to watch his sodium. He can gauge how well he is doing by monitoring weight daily. States with his kidney problems and heart problems he has to be careful about his fluid intake. He states he gets thirsty easily and likes to drink water but that can be bad for him. Recommendation made to try a cup of ice and when he feels thirsty to suck on some ice cubes instead of drinking water. Will continue to monitor for changes.  Reviewed Heart Failure Action Plan in depth and provided written copy. 07-17-2022: The patient has parameters to call his providers for weight greater than 195. Usual readings are 193 to 195. One day this week his weight was 198 and he knew to take his additional fluid pill as prescribed by his nephrologist.  Assessed need for readable accurate scales in home. 07-17-2022: Scales in home and used daily Provided education  about placing scale on hard, flat surface Advised patient to weigh each morning after emptying bladder Discussed importance of daily weight and advised patient to weigh and record daily Reviewed role of diuretics in prevention of fluid overload and management of heart failure. 07-17-2022: No issues with medications and patient also takes a potassium supplement; Discussed the importance of keeping all appointments with provider. 07-17-2022: The patient is compliant with provider appointments. Last seen by pcp on 07-03-2022 Provided patient with education about the role of exercise in the management of heart failure. 07-17-2022: Review of exercise habits. The patient plays tennis 4 times a week if his schedule allows, he also [facility] [employer] and is there an hour each day he goes, usually her goes 3 to 4 times a week. The patient likes to stay active Reviewed with the patient resources available if needed in the future for SDOH or other needs Advised patient to discuss changes in weight, questions or concerns about heart failure or heart health with provider Screening for signs and symptoms of depression related to chronic disease state  Assessed social determinant of health barriers  EF is 45 to 50%           SDOH assessments and interventions completed:  Yes  SDOH Interventions Today    Flowsheet Row Most Recent Value  SDOH Interventions   Food Insecurity Interventions Intervention Not  Indicated  Financial Strain Interventions Intervention Not Indicated  Housing Interventions Intervention Not Indicated  Physical Activity Interventions Intervention Not Indicated, [employer], Other (Comments)  [the patient likes to play tennis about 4 days a week if his schedule allows]  Stress Interventions Intervention Not Indicated  Social Connections Interventions Intervention Not Indicated, [employer]  Transportation Interventions Intervention Not Indicated        Care Coordination Interventions  Activated:  Yes  Care Coordination Interventions:  Yes, provided   Follow up plan: Follow up call scheduled for 09-26-2022 at 0900 am     Encounter Outcome:  Pt. Visit Completed    Noreene Larsson RN, MSN, [place] Network Mobile: [phone]

## 2022-07-29 NOTE — Progress Notes (Unsigned)
Patient ID: John [name], male    DOB: August 17, 1946, 76 y.o.   MRN: [id number]   John [name] is a 76 y/o male with a history of HTN, CKD, COPD, hyperlipidemia, remote tobacco use and chronic heart failure.   Echo report from 01/29/22 reviewed and showed an EF of 55-60% along with moderately elevated PA pressure and mild/moderate TR.   Has not been admitted or been in the ED in the last 6 months.    He presents today for a follow-up visit with a chief complaint of fluctuating weight. Describes this as chronic in nature and says that his weight ranges from 192-197 pounds. Has not taken metolazone in ~ 2 months. Losartan has recently been decreased to '50mg'$  due to low BP.   He has associated chronic difficulty sleeping along with this. He denies any dizziness, abdominal distention, palpitations, pedal edema, chest pain, wheezing, shortness of breath, cough or fatigue. Quite active mowing yards and says that he tends to not eat until late in the day around 7pm. He will then have a snack ~ 10pm before going to bed. Not adding salt and trying to be mindful of the sodium content of foods. He is unsure of his how much fluids he is drinking.   Does not currently have a follow-up appointment with nephrology.   Past Medical History:  Diagnosis Date   CHF (congestive heart failure) ([facility])    EF 45% in 2021   Chronic kidney disease    COPD (chronic obstructive pulmonary disease) ([facility]) 02/16/2020   Hyperlipidemia    Hypertension    Pneumonia 2021    Past Surgical History:  Procedure Laterality Date   CATARACT EXTRACTION W/PHACO Left 10/31/2021   Procedure: CATARACT EXTRACTION PHACO AND INTRAOCULAR LENS PLACEMENT ([facility]) LEFT VISION BLUE 3.45 00:48.0;  Surgeon: Leandrew Koyanagi, MD;  Location: [facility];  Service: Ophthalmology;  Laterality: Left;   CATARACT EXTRACTION W/PHACO Right 11/14/2021   Procedure: CATARACT EXTRACTION PHACO AND INTRAOCULAR LENS PLACEMENT ([facility]) RIGHT 8.59 01:10.5;   Surgeon: Leandrew Koyanagi, MD;  Location: [facility];  Service: Ophthalmology;  Laterality: Right;   INGUINAL HERNIA REPAIR Right 02/04/2017   Procedure: HERNIA REPAIR INGUINAL ADULT;  Surgeon: Leonie Green, MD;  Location: [facility];  Service: General;  Laterality: Right;   NO PAST SURGERIES     QUADRICEPS TENDON REPAIR Left 02/15/2020   Procedure: REPAIR QUADRICEP TENDON;  Surgeon: Corky Mull, MD;  Location: [facility];  Service: Orthopedics;  Laterality: Left;   Family History  Problem Relation Age of Onset   Diabetes Mother    Diabetes Brother    Diabetes Maternal Grandmother    Diabetes Paternal Grandmother    Social History   Tobacco Use   Smoking status: Former    Packs/day: 0.25    Years: 53.00    Total pack years: 13.25    Types: Cigarettes    Quit date: 02/15/2015    Years since quitting: 7.4   Smokeless tobacco: Never  Substance Use Topics   Alcohol use: Yes    Comment: occassional   No Known Allergies  Prior to Admission medications   Medication Sig Start Date End Date Taking? Authorizing Provider  aspirin EC 81 MG tablet Take 1 tablet (81 mg total) by mouth daily. Swallow whole. 03/28/22  Yes Niana Martorana, Otila Kluver A, FNP  losartan (COZAAR) 50 MG tablet Take 50 mg by mouth daily. 07/03/22  Yes [provider]  potassium chloride SA (KLOR-CON M) 20 MEQ tablet Take  1 tablet (20 mEq total) by mouth daily. 02/26/22  Yes Darylene Price A, FNP  torsemide (DEMADEX) 100 MG tablet Take 1 tablet (100 mg total) by mouth daily. Take 1 tablet (100 mg total) by mouth daily 02/06/22  Yes Sreenath, Sudheer B, MD  metolazone (ZAROXOLYN) 5 MG tablet Take 5 mg by mouth 2 (two) times a week. On Mon and Fri Patient not taking: Reported on 07/30/2022    [provider]    Review of Systems  Constitutional:  Negative for appetite change and fatigue.  HENT:  Negative for congestion, postnasal drip and sore throat.   Eyes: Negative.   Respiratory:  Negative for  cough, shortness of breath and wheezing.   Cardiovascular:  Negative for chest pain, palpitations and leg swelling.  Gastrointestinal:  Negative for abdominal distention and abdominal pain.  Endocrine: Negative.   Genitourinary: Negative.   Musculoskeletal:  Negative for arthralgias, back pain and neck pain.  Skin: Negative.   Allergic/Immunologic: Negative.   Neurological:  Negative for dizziness and light-headedness.  Hematological:  Negative for adenopathy. Does not bruise/bleed easily.  Psychiatric/Behavioral:  Positive for sleep disturbance (chronic; sleeping on 2 pillows). Negative for dysphoric mood. The patient is not nervous/anxious.    Vitals:   07/30/22 0837  BP: (!) 89/71  Pulse: 90  Resp: 16  SpO2: 90%  Weight: 203 lb 4 oz (92.2 kg)  Height: '5\' 10"'$  (1.778 m)   Wt Readings from Last 3 Encounters:  07/30/22 203 lb 4 oz (92.2 kg)  03/28/22 192 lb 8 oz (87.3 kg)  02/25/22 203 lb 4 oz (92.2 kg)   Lab Results  Component Value Date   CREATININE 2.13 (H) 02/25/2022   CREATININE 1.80 (H) 02/06/2022   CREATININE 1.57 (H) 02/05/2022    Physical Exam Vitals and nursing note reviewed.  Constitutional:      Appearance: Normal appearance.  HENT:     Head: Normocephalic and atraumatic.  Cardiovascular:     Rate and Rhythm: Normal rate and regular rhythm.  Pulmonary:     Effort: Pulmonary effort is normal. No respiratory distress.     Breath sounds: No wheezing or rales.  Abdominal:     General: There is no distension.     Palpations: Abdomen is soft.  Musculoskeletal:        General: No tenderness.     Cervical back: Normal range of motion and neck supple.     Right lower leg: Edema (1+ pitting) present.     Left lower leg: Edema (trace pitting) present.  Skin:    General: Skin is warm and dry.  Neurological:     General: No focal deficit present.     Mental Status: He is alert and oriented to person, place, and time.  Psychiatric:        Mood and Affect: Mood  normal.        Behavior: Behavior normal.        Thought Content: Thought content normal.   Assessment & Plan:  1: Chronic heart failure with preserved ejection fraction without structural changes- - NYHA class I - euvolemic today - weighing daily and understands to call for an overnight weight gain of > 2 pounds or a weekly weight gain of > 5 pounds; says that his home weight has been ranging from 192-197 pounds - weight up 11 pounds from last visit here 4 months ago - not adding salt to his food but his wife does the cooking; has been eating supper  late at night around 7pm and then eating a snack before bedtime around 10pm - fluid restriction of 32-40 ounces daily per nephrology although he has no idea how much fluids he is drinking; encouraged him to use a measuring cup to see how many ounces his cup at home that he drinks out of holds - consider adding SGLT2 but need to watch BP - BNP 01/28/22 was 968.0  2: HTN with CKD- - BP low (89/71); will decrease his torsemide to '50mg'$  QOD alternating with '100mg'$  QOD - saw PCP (John [name]) 07/03/22 and decreased losartan to '50mg'$  daily - saw nephrology John [name]) 05/14/22; put on his AVS to call Dr John [name] office to get f/u appointment scheduled - BMP 07/03/22 reviewed and showed sodium 141, potassium 3.3, creatinine 2.8 and GFR 27  3: COPD- - has oxygen at home that he wears at 2L PRN - quit smoking ~ 10 years ago   Medication bottles reviewed.   Return in 6 months, sooner if needed.

## 2022-07-30 ENCOUNTER — Ambulatory Visit: Payer: Medicare HMO | Attending: Family | Admitting: Family

## 2022-07-30 ENCOUNTER — Encounter: Payer: Self-pay | Admitting: Family

## 2022-07-30 VITALS — BP 89/71 | HR 90 | Resp 16 | Ht 70.0 in | Wt 203.2 lb

## 2022-07-30 DIAGNOSIS — I10 Essential (primary) hypertension: Secondary | ICD-10-CM | POA: Diagnosis not present

## 2022-07-30 DIAGNOSIS — I130 Hypertensive heart and chronic kidney disease with heart failure and stage 1 through stage 4 chronic kidney disease, or unspecified chronic kidney disease: Secondary | ICD-10-CM | POA: Diagnosis not present

## 2022-07-30 DIAGNOSIS — Z87891 Personal history of nicotine dependence: Secondary | ICD-10-CM | POA: Diagnosis not present

## 2022-07-30 DIAGNOSIS — J449 Chronic obstructive pulmonary disease, unspecified: Secondary | ICD-10-CM | POA: Insufficient documentation

## 2022-07-30 DIAGNOSIS — Z79899 Other long term (current) drug therapy: Secondary | ICD-10-CM | POA: Diagnosis not present

## 2022-07-30 DIAGNOSIS — I5032 Chronic diastolic (congestive) heart failure: Secondary | ICD-10-CM | POA: Diagnosis not present

## 2022-07-30 DIAGNOSIS — N189 Chronic kidney disease, unspecified: Secondary | ICD-10-CM | POA: Diagnosis not present

## 2022-07-30 DIAGNOSIS — E785 Hyperlipidemia, unspecified: Secondary | ICD-10-CM | POA: Diagnosis not present

## 2022-07-30 NOTE — Patient Instructions (Addendum)
Continue weighing daily and call for an overnight weight gain of 3 pounds or more or a weekly weight gain of more than 5 pounds.   If you have voicemail, please make sure your mailbox is cleaned out so that we may leave a message and please make sure to listen to any voicemails.    Call Dr. Elwyn Lade office to get an appointment scheduled.   Change your torsemide dose to 1/2 tablet every other day alternating with 1 tablet every other day

## 2022-08-22 DIAGNOSIS — N184 Chronic kidney disease, stage 4 (severe): Secondary | ICD-10-CM | POA: Diagnosis not present

## 2022-08-22 DIAGNOSIS — I5022 Chronic systolic (congestive) heart failure: Secondary | ICD-10-CM | POA: Diagnosis not present

## 2022-08-22 DIAGNOSIS — I10 Essential (primary) hypertension: Secondary | ICD-10-CM | POA: Diagnosis not present

## 2022-08-22 DIAGNOSIS — N2581 Secondary hyperparathyroidism of renal origin: Secondary | ICD-10-CM | POA: Diagnosis not present

## 2022-08-26 ENCOUNTER — Ambulatory Visit
Admission: RE | Admit: 2022-08-26 | Discharge: 2022-08-26 | Disposition: A | Discharge status: Home / Self Care | Payer: Medicare HMO | Source: Ambulatory Visit | Attending: Nephrology | Admitting: Nephrology

## 2022-08-26 VITALS — BP 96/65 | HR 87 | Temp 98.2°F | Resp 16 | Ht 70.0 in | Wt 206.0 lb

## 2022-08-26 DIAGNOSIS — N189 Chronic kidney disease, unspecified: Secondary | ICD-10-CM | POA: Insufficient documentation

## 2022-08-26 DIAGNOSIS — R609 Edema, unspecified: Secondary | ICD-10-CM | POA: Diagnosis not present

## 2022-08-26 DIAGNOSIS — N184 Chronic kidney disease, stage 4 (severe): Secondary | ICD-10-CM

## 2022-08-26 MED ORDER — FUROSEMIDE 10 MG/ML IJ SOLN
INTRAMUSCULAR | Status: AC
  Administered 2022-08-26: 80 mg
  Filled 2022-08-26: qty 8

## 2022-08-26 MED ORDER — FUROSEMIDE 10 MG/ML IJ SOLN: 80.0000 mg | Freq: Once | INTRAMUSCULAR | Status: AC

## 2022-08-28 ENCOUNTER — Ambulatory Visit
Admission: RE | Admit: 2022-08-28 | Discharge: 2022-08-28 | Disposition: A | Discharge status: Home / Self Care | Payer: Medicare HMO | Source: Ambulatory Visit | Attending: Nephrology | Admitting: Nephrology

## 2022-08-28 DIAGNOSIS — R609 Edema, unspecified: Secondary | ICD-10-CM | POA: Diagnosis not present

## 2022-08-28 DIAGNOSIS — N189 Chronic kidney disease, unspecified: Secondary | ICD-10-CM | POA: Diagnosis not present

## 2022-08-28 MED ORDER — FUROSEMIDE 10 MG/ML IJ SOLN
80.0000 mg | Freq: Once | INTRAMUSCULAR | Status: AC
  Administered 2022-08-28: 80 mg via INTRAVENOUS

## 2022-08-28 MED ORDER — FUROSEMIDE 10 MG/ML IJ SOLN
INTRAMUSCULAR | Status: AC
  Filled 2022-08-28: qty 8

## 2022-08-30 ENCOUNTER — Ambulatory Visit
Admission: RE | Admit: 2022-08-30 | Discharge: 2022-08-30 | Disposition: A | Discharge status: Home / Self Care | Payer: Medicare HMO | Source: Ambulatory Visit | Attending: Nephrology | Admitting: Nephrology

## 2022-08-30 DIAGNOSIS — R609 Edema, unspecified: Secondary | ICD-10-CM | POA: Insufficient documentation

## 2022-08-30 DIAGNOSIS — N184 Chronic kidney disease, stage 4 (severe): Secondary | ICD-10-CM | POA: Diagnosis not present

## 2022-08-30 LAB — RENAL FUNCTION PANEL
Albumin: 3.6 g/dL (ref 3.5–5.0)
Anion gap: 8 (ref 5–15)
BUN: 31 mg/dL — ABNORMAL HIGH (ref 8–23)
CO2: 32 mmol/L (ref 22–32)
Calcium: 9.3 mg/dL (ref 8.9–10.3)
Chloride: 101 mmol/L (ref 98–111)
Creatinine, Ser: 2.52 mg/dL — ABNORMAL HIGH (ref 0.61–1.24)
GFR, Estimated: 26 mL/min — ABNORMAL LOW (ref >60)
Glucose, Bld: 121 mg/dL — ABNORMAL HIGH (ref 70–99)
Phosphorus: 3.6 mg/dL (ref 2.5–4.6)
Potassium: 4.5 mmol/L (ref 3.5–5.1)
Sodium: 141 mmol/L (ref 135–145)

## 2022-08-30 MED ORDER — SODIUM CHLORIDE FLUSH 0.9 % IV SOLN
INTRAVENOUS | Status: AC
  Administered 2022-08-30: 10 mL
  Filled 2022-08-30: qty 10

## 2022-08-30 MED ORDER — FUROSEMIDE 10 MG/ML IJ SOLN
INTRAMUSCULAR | Status: AC
Start: 2022-08-30 — End: 2022-08-30
  Administered 2022-08-30: 80 mg via INTRAVENOUS
  Filled 2022-08-30: qty 8

## 2022-08-30 MED ORDER — FUROSEMIDE 10 MG/ML IJ SOLN: 80.0000 mg | Freq: Once | INTRAMUSCULAR | Status: AC

## 2022-09-02 ENCOUNTER — Ambulatory Visit
Admission: RE | Admit: 2022-09-02 | Discharge: 2022-09-02 | Disposition: A | Discharge status: Home / Self Care | Payer: Medicare HMO | Source: Ambulatory Visit | Attending: Nephrology | Admitting: Nephrology

## 2022-09-02 DIAGNOSIS — R609 Edema, unspecified: Secondary | ICD-10-CM | POA: Insufficient documentation

## 2022-09-02 DIAGNOSIS — N189 Chronic kidney disease, unspecified: Secondary | ICD-10-CM | POA: Insufficient documentation

## 2022-09-02 MED ORDER — FUROSEMIDE 10 MG/ML IJ SOLN
80.0000 mg | Freq: Once | INTRAMUSCULAR | Status: AC
  Administered 2022-09-02: 80 mg via INTRAVENOUS

## 2022-09-04 ENCOUNTER — Ambulatory Visit
Admission: RE | Admit: 2022-09-04 | Discharge: 2022-09-04 | Disposition: A | Discharge status: Home / Self Care | Payer: Medicare HMO | Source: Ambulatory Visit | Attending: Nephrology | Admitting: Nephrology

## 2022-09-04 DIAGNOSIS — R609 Edema, unspecified: Secondary | ICD-10-CM | POA: Insufficient documentation

## 2022-09-04 DIAGNOSIS — N189 Chronic kidney disease, unspecified: Secondary | ICD-10-CM | POA: Diagnosis not present

## 2022-09-04 MED ORDER — FUROSEMIDE 10 MG/ML IJ SOLN
80.0000 mg | Freq: Once | INTRAMUSCULAR | Status: AC
  Administered 2022-09-04: 80 mg via INTRAVENOUS

## 2022-09-06 ENCOUNTER — Ambulatory Visit
Admission: RE | Admit: 2022-09-06 | Discharge: 2022-09-06 | Disposition: A | Discharge status: Home / Self Care | Payer: Medicare HMO | Source: Ambulatory Visit | Attending: Nephrology | Admitting: Nephrology

## 2022-09-06 DIAGNOSIS — R609 Edema, unspecified: Secondary | ICD-10-CM | POA: Diagnosis not present

## 2022-09-06 DIAGNOSIS — N184 Chronic kidney disease, stage 4 (severe): Secondary | ICD-10-CM | POA: Insufficient documentation

## 2022-09-06 LAB — RENAL FUNCTION PANEL
Albumin: 3.3 g/dL — ABNORMAL LOW (ref 3.5–5.0)
Anion gap: 7 (ref 5–15)
BUN: 43 mg/dL — ABNORMAL HIGH (ref 8–23)
CO2: 32 mmol/L (ref 22–32)
Calcium: 8.9 mg/dL (ref 8.9–10.3)
Chloride: 104 mmol/L (ref 98–111)
Creatinine, Ser: 2.32 mg/dL — ABNORMAL HIGH (ref 0.61–1.24)
GFR, Estimated: 28 mL/min — ABNORMAL LOW (ref >60)
Glucose, Bld: 106 mg/dL — ABNORMAL HIGH (ref 70–99)
Phosphorus: 3.9 mg/dL (ref 2.5–4.6)
Potassium: 3.8 mmol/L (ref 3.5–5.1)
Sodium: 143 mmol/L (ref 135–145)

## 2022-09-06 MED ORDER — FUROSEMIDE 10 MG/ML IJ SOLN
INTRAMUSCULAR | Status: AC
  Administered 2022-09-06: 80 mg via INTRAVENOUS
  Filled 2022-09-06: qty 8

## 2022-09-06 MED ORDER — FUROSEMIDE 10 MG/ML IJ SOLN: 80.0000 mg | Freq: Once | INTRAMUSCULAR | Status: AC

## 2022-09-26 ENCOUNTER — Ambulatory Visit: Payer: Self-pay

## 2022-09-26 NOTE — Patient Outreach (Signed)
  Care Coordination   09/26/2022 Name: John [name] MRN: [id number] DOB: [birth year]   Care Coordination Outreach Attempts:  An unsuccessful telephone outreach was attempted for a scheduled appointment today.  Follow Up Plan:  Additional outreach attempts will be made to offer the patient care coordination information and services.   Encounter Outcome:  No Answer  Care Coordination Interventions Activated:  No   Care Coordination Interventions:  No, not indicated    Noreene Larsson RN, MSN, [facility]  Mobile: [phone]

## 2022-10-01 DIAGNOSIS — I10 Essential (primary) hypertension: Secondary | ICD-10-CM | POA: Diagnosis not present

## 2022-10-01 DIAGNOSIS — N2581 Secondary hyperparathyroidism of renal origin: Secondary | ICD-10-CM | POA: Diagnosis not present

## 2022-10-01 DIAGNOSIS — N184 Chronic kidney disease, stage 4 (severe): Secondary | ICD-10-CM | POA: Diagnosis not present

## 2022-10-01 DIAGNOSIS — I5022 Chronic systolic (congestive) heart failure: Secondary | ICD-10-CM | POA: Diagnosis not present

## 2022-10-02 ENCOUNTER — Emergency Department: Payer: Medicare HMO

## 2022-10-02 ENCOUNTER — Other Ambulatory Visit: Payer: Self-pay

## 2022-10-02 ENCOUNTER — Inpatient Hospital Stay
Admission: EM | Admit: 2022-10-02 | Discharge: 2022-10-15 | DRG: 291 | Disposition: A | Discharge status: Skilled Nursing Facility | Payer: Medicare HMO | Attending: Internal Medicine | Admitting: Internal Medicine

## 2022-10-02 ENCOUNTER — Inpatient Hospital Stay
Admit: 2022-10-02 | Discharge: 2022-10-02 | Disposition: A | Discharge status: Home / Self Care | Payer: Medicare HMO | Attending: Internal Medicine | Admitting: Internal Medicine

## 2022-10-02 DIAGNOSIS — E876 Hypokalemia: Secondary | ICD-10-CM | POA: Diagnosis not present

## 2022-10-02 DIAGNOSIS — I517 Cardiomegaly: Secondary | ICD-10-CM | POA: Diagnosis not present

## 2022-10-02 DIAGNOSIS — Z87891 Personal history of nicotine dependence: Secondary | ICD-10-CM

## 2022-10-02 DIAGNOSIS — N2581 Secondary hyperparathyroidism of renal origin: Secondary | ICD-10-CM | POA: Diagnosis not present

## 2022-10-02 DIAGNOSIS — R609 Edema, unspecified: Secondary | ICD-10-CM | POA: Diagnosis not present

## 2022-10-02 DIAGNOSIS — D751 Secondary polycythemia: Secondary | ICD-10-CM | POA: Diagnosis not present

## 2022-10-02 DIAGNOSIS — E663 Overweight: Secondary | ICD-10-CM | POA: Diagnosis present

## 2022-10-02 DIAGNOSIS — N179 Acute kidney failure, unspecified: Secondary | ICD-10-CM | POA: Diagnosis present

## 2022-10-02 DIAGNOSIS — I130 Hypertensive heart and chronic kidney disease with heart failure and stage 1 through stage 4 chronic kidney disease, or unspecified chronic kidney disease: Principal | ICD-10-CM | POA: Diagnosis present

## 2022-10-02 DIAGNOSIS — R5381 Other malaise: Secondary | ICD-10-CM | POA: Diagnosis present

## 2022-10-02 DIAGNOSIS — I5033 Acute on chronic diastolic (congestive) heart failure: Secondary | ICD-10-CM | POA: Diagnosis present

## 2022-10-02 DIAGNOSIS — K5900 Constipation, unspecified: Secondary | ICD-10-CM | POA: Diagnosis present

## 2022-10-02 DIAGNOSIS — R29898 Other symptoms and signs involving the musculoskeletal system: Secondary | ICD-10-CM | POA: Diagnosis not present

## 2022-10-02 DIAGNOSIS — J9601 Acute respiratory failure with hypoxia: Secondary | ICD-10-CM | POA: Diagnosis present

## 2022-10-02 DIAGNOSIS — D649 Anemia, unspecified: Secondary | ICD-10-CM | POA: Diagnosis not present

## 2022-10-02 DIAGNOSIS — I10 Essential (primary) hypertension: Secondary | ICD-10-CM | POA: Diagnosis not present

## 2022-10-02 DIAGNOSIS — L03113 Cellulitis of right upper limb: Secondary | ICD-10-CM | POA: Diagnosis present

## 2022-10-02 DIAGNOSIS — R600 Localized edema: Secondary | ICD-10-CM | POA: Diagnosis not present

## 2022-10-02 DIAGNOSIS — Z833 Family history of diabetes mellitus: Secondary | ICD-10-CM

## 2022-10-02 DIAGNOSIS — Z79899 Other long term (current) drug therapy: Secondary | ICD-10-CM | POA: Diagnosis not present

## 2022-10-02 DIAGNOSIS — I129 Hypertensive chronic kidney disease with stage 1 through stage 4 chronic kidney disease, or unspecified chronic kidney disease: Secondary | ICD-10-CM | POA: Diagnosis not present

## 2022-10-02 DIAGNOSIS — E785 Hyperlipidemia, unspecified: Secondary | ICD-10-CM | POA: Diagnosis present

## 2022-10-02 DIAGNOSIS — Z6831 Body mass index (BMI) 31.0-31.9, adult: Secondary | ICD-10-CM | POA: Diagnosis not present

## 2022-10-02 DIAGNOSIS — N189 Chronic kidney disease, unspecified: Secondary | ICD-10-CM | POA: Diagnosis not present

## 2022-10-02 DIAGNOSIS — L03115 Cellulitis of right lower limb: Secondary | ICD-10-CM | POA: Diagnosis not present

## 2022-10-02 DIAGNOSIS — Z8739 Personal history of other diseases of the musculoskeletal system and connective tissue: Secondary | ICD-10-CM | POA: Diagnosis not present

## 2022-10-02 DIAGNOSIS — J449 Chronic obstructive pulmonary disease, unspecified: Secondary | ICD-10-CM | POA: Diagnosis not present

## 2022-10-02 DIAGNOSIS — K219 Gastro-esophageal reflux disease without esophagitis: Secondary | ICD-10-CM | POA: Diagnosis present

## 2022-10-02 DIAGNOSIS — E875 Hyperkalemia: Secondary | ICD-10-CM | POA: Diagnosis not present

## 2022-10-02 DIAGNOSIS — N184 Chronic kidney disease, stage 4 (severe): Secondary | ICD-10-CM | POA: Diagnosis not present

## 2022-10-02 DIAGNOSIS — I509 Heart failure, unspecified: Secondary | ICD-10-CM | POA: Diagnosis not present

## 2022-10-02 DIAGNOSIS — I700 Atherosclerosis of aorta: Secondary | ICD-10-CM | POA: Diagnosis not present

## 2022-10-02 DIAGNOSIS — L0390 Cellulitis, unspecified: Secondary | ICD-10-CM | POA: Diagnosis not present

## 2022-10-02 DIAGNOSIS — I5A Non-ischemic myocardial injury (non-traumatic): Secondary | ICD-10-CM | POA: Diagnosis present

## 2022-10-02 DIAGNOSIS — Z9981 Dependence on supplemental oxygen: Secondary | ICD-10-CM | POA: Diagnosis not present

## 2022-10-02 DIAGNOSIS — D696 Thrombocytopenia, unspecified: Secondary | ICD-10-CM | POA: Diagnosis not present

## 2022-10-02 DIAGNOSIS — I5032 Chronic diastolic (congestive) heart failure: Secondary | ICD-10-CM | POA: Diagnosis not present

## 2022-10-02 DIAGNOSIS — Z7401 Bed confinement status: Secondary | ICD-10-CM | POA: Diagnosis not present

## 2022-10-02 DIAGNOSIS — I5023 Acute on chronic systolic (congestive) heart failure: Principal | ICD-10-CM

## 2022-10-02 DIAGNOSIS — Z20822 Contact with and (suspected) exposure to covid-19: Secondary | ICD-10-CM | POA: Diagnosis not present

## 2022-10-02 DIAGNOSIS — I5031 Acute diastolic (congestive) heart failure: Secondary | ICD-10-CM | POA: Diagnosis not present

## 2022-10-02 DIAGNOSIS — I5030 Unspecified diastolic (congestive) heart failure: Secondary | ICD-10-CM | POA: Diagnosis not present

## 2022-10-02 DIAGNOSIS — R0602 Shortness of breath: Secondary | ICD-10-CM | POA: Diagnosis not present

## 2022-10-02 LAB — COMPREHENSIVE METABOLIC PANEL
ALT: 17 U/L (ref 0–44)
AST: 27 U/L (ref 15–41)
Albumin: 3.1 g/dL — ABNORMAL LOW (ref 3.5–5.0)
Alkaline Phosphatase: 64 U/L (ref 38–126)
Anion gap: 4 — ABNORMAL LOW (ref 5–15)
BUN: 42 mg/dL — ABNORMAL HIGH (ref 8–23)
CO2: 34 mmol/L — ABNORMAL HIGH (ref 22–32)
Calcium: 8.8 mg/dL — ABNORMAL LOW (ref 8.9–10.3)
Chloride: 103 mmol/L (ref 98–111)
Creatinine, Ser: 2.41 mg/dL — ABNORMAL HIGH (ref 0.61–1.24)
GFR, Estimated: 27 mL/min — ABNORMAL LOW (ref >60)
Glucose, Bld: 108 mg/dL — ABNORMAL HIGH (ref 70–99)
Potassium: 5.3 mmol/L — ABNORMAL HIGH (ref 3.5–5.1)
Sodium: 141 mmol/L (ref 135–145)
Total Bilirubin: 1.5 mg/dL — ABNORMAL HIGH (ref 0.3–1.2)
Total Protein: 6.1 g/dL — ABNORMAL LOW (ref 6.5–8.1)

## 2022-10-02 LAB — CBC WITH DIFFERENTIAL/PLATELET
Abs Immature Granulocytes: 0.02 10*3/uL (ref 0.00–0.07)
Basophils Absolute: 0 10*3/uL (ref 0.0–0.1)
Basophils Relative: 0 %
Eosinophils Absolute: 0.1 10*3/uL (ref 0.0–0.5)
Eosinophils Relative: 1 %
HCT: 54.2 % — ABNORMAL HIGH (ref 39.0–52.0)
Hemoglobin: 16.7 g/dL (ref 13.0–17.0)
Immature Granulocytes: 0 %
Lymphocytes Relative: 12 %
Lymphs Abs: 1 10*3/uL (ref 0.7–4.0)
MCH: 32.2 pg (ref 26.0–34.0)
MCHC: 30.8 g/dL (ref 30.0–36.0)
MCV: 104.4 fL — ABNORMAL HIGH (ref 80.0–100.0)
Monocytes Absolute: 0.8 10*3/uL (ref 0.1–1.0)
Monocytes Relative: 10 %
Neutro Abs: 6.1 10*3/uL (ref 1.7–7.7)
Neutrophils Relative %: 77 %
Platelets: 117 10*3/uL — ABNORMAL LOW (ref 150–400)
RBC: 5.19 MIL/uL (ref 4.22–5.81)
RDW: 14.6 % (ref 11.5–15.5)
WBC: 8.1 10*3/uL (ref 4.0–10.5)
nRBC: 0 % (ref 0.0–0.2)

## 2022-10-02 LAB — ECHOCARDIOGRAM COMPLETE
AR max vel: 2.67 cm2
AV Area VTI: 2.76 cm2
AV Area mean vel: 2.55 cm2
AV Mean grad: 2 mmHg
AV Peak grad: 4.5 mmHg
Ao pk vel: 1.06 m/s
Area-P 1/2: 3.27 cm2
Height: 70 in
S' Lateral: 2.5 cm
Weight: 3552 oz

## 2022-10-02 LAB — TROPONIN I (HIGH SENSITIVITY)
Troponin I (High Sensitivity): 32 ng/L — ABNORMAL HIGH (ref <18)
Troponin I (High Sensitivity): 29 ng/L — ABNORMAL HIGH (ref <18)

## 2022-10-02 LAB — BRAIN NATRIURETIC PEPTIDE: B Natriuretic Peptide: 1103.2 pg/mL — ABNORMAL HIGH (ref 0.0–100.0)

## 2022-10-02 MED ORDER — DM-GUAIFENESIN ER 30-600 MG PO TB12: 1.0000 | ORAL_TABLET | Freq: Two times a day (BID) | ORAL | Status: DC | PRN

## 2022-10-02 MED ORDER — LOSARTAN POTASSIUM 50 MG PO TABS
50.0000 mg | ORAL_TABLET | Freq: Every day | ORAL | Status: DC
  Administered 2022-10-03 – 2022-10-09 (×6): 50 mg via ORAL
  Filled 2022-10-02 (×7): qty 1

## 2022-10-02 MED ORDER — ENOXAPARIN SODIUM 30 MG/0.3ML IJ SOSY
30.0000 mg | PREFILLED_SYRINGE | INTRAMUSCULAR | Status: DC
  Administered 2022-10-02: 30 mg via SUBCUTANEOUS
  Filled 2022-10-02: qty 0.3

## 2022-10-02 MED ORDER — ACETAMINOPHEN 325 MG PO TABS: 650.0000 mg | ORAL_TABLET | Freq: Four times a day (QID) | ORAL | Status: DC | PRN

## 2022-10-02 MED ORDER — ALBUTEROL SULFATE (2.5 MG/3ML) 0.083% IN NEBU: 3.0000 mL | INHALATION_SOLUTION | RESPIRATORY_TRACT | Status: DC | PRN

## 2022-10-02 MED ORDER — ONDANSETRON HCL 4 MG/2ML IJ SOLN: 4.0000 mg | Freq: Three times a day (TID) | INTRAMUSCULAR | Status: DC | PRN

## 2022-10-02 MED ORDER — FUROSEMIDE 10 MG/ML IJ SOLN
160.0000 mg | Freq: Once | INTRAVENOUS | Status: AC
  Administered 2022-10-02: 160 mg via INTRAVENOUS
  Filled 2022-10-02: qty 10

## 2022-10-02 MED ORDER — HYDRALAZINE HCL 20 MG/ML IJ SOLN: 5.0000 mg | INTRAMUSCULAR | Status: DC | PRN

## 2022-10-02 MED ORDER — ASPIRIN 81 MG PO TBEC
81.0000 mg | DELAYED_RELEASE_TABLET | Freq: Every day | ORAL | Status: DC
  Administered 2022-10-03 – 2022-10-15 (×13): 81 mg via ORAL
  Filled 2022-10-02 (×13): qty 1

## 2022-10-02 MED ORDER — FUROSEMIDE 10 MG/ML IJ SOLN
8.0000 mg/h | INTRAVENOUS | Status: DC
  Administered 2022-10-02 – 2022-10-08 (×7): 8 mg/h via INTRAVENOUS
  Filled 2022-10-02 (×7): qty 20

## 2022-10-02 MED ORDER — SODIUM ZIRCONIUM CYCLOSILICATE 5 G PO PACK
5.0000 g | PACK | Freq: Once | ORAL | Status: AC
  Administered 2022-10-02: 5 g via ORAL
  Filled 2022-10-02: qty 1

## 2022-10-02 NOTE — Progress Notes (Addendum)
[name]  ROUNDING NOTE   Subjective:   John [name] is a 76 year old male with past medical conditions including COPD, hyperlipidemia, hypertension, CHF, and chronic kidney disease stage IV.  Patient presents to the emergency room at the advice of nephrologist for evaluation of progressive lower extremity edema.  Patient has been admitted for Acute on chronic diastolic CHF (congestive heart failure) ([place]) [I50.33]  Patient is known to our practice and is followed by Dr. Zollie Scale outpatient.  Last seen in office yesterday with Dr. Holley Raring to evaluate increased weight gain and lower extremity edema.  It is noted that patient received IV Lasix, 6 doses, as same-day surgery without relief of symptoms.  Patient seen in ED, resting on stretcher.  Family at bedside.  States he has continued to take all medications including torsemide 100 mg daily.  Patient states he was instructed to discontinue metolazone "a while ago".  Has remained on room air baseline, currently on 2 L nasal cannula..  Complains of increased fatigue.  Denies chest or abdominal pain.  Denies nausea, vomiting, or diarrhea.  Denies recent NSAID use.  Labs on ED arrival include sodium 141, potassium 5.3, serum bicarb 34, BUN 42, creatinine 2.41 with GFR 27, albumin 3.1, BNP greater than 1100, troponin 32.  Chest x-ray negative for acute changes.  We have been consulted to manage fluid volume overload.  Patient was seen in the ER. Patient main concern was that he is accumulating fluid. Patient complains of increasing lower extremity edema Patient complains of shortness of breath. No complaint of hematuria No complaint of nausea or vomiting   Objective:  Vital signs in last 24 hours:  Temp:  [98.3 F (36.8 C)] 98.3 F (36.8 C) (10/18 0815) Pulse Rate:  [85] 85 (10/18 0815) Resp:  [18] 18 (10/18 0815) BP: (117)/(76) 117/76 (10/18 0815) SpO2:  [80 %-91 %] 91 % (10/18 0821) Weight:  [100.7 kg] 100.7 kg (10/18  0823)  Weight change:  Filed Weights   10/02/22 0815 10/02/22 0823  Weight: 100.7 kg 100.7 kg    Intake/Output: No intake/output data recorded.   Intake/Output this shift:  No intake/output data recorded.  Physical Exam: General: NAD, resting comfortably  Head: Normocephalic, atraumatic. Moist oral mucosal membranes  Eyes: Anicteric  Lungs:  Diminished in bases, normal effort, [place] O2  Heart: Regular rate and rhythm  Abdomen:  Soft, nontender  Extremities: 2--3+ Non-pitting peripheral edema.  Neurologic: Nonfocal, moving all four extremities  Skin: No lesions  Access: None    Basic Metabolic Panel: Recent Labs  Lab 10/02/22 0858  NA 141  K 5.3*  CL 103  CO2 34*  GLUCOSE 108*  BUN 42*  CREATININE 2.41*  CALCIUM 8.8*    Liver Function Tests: Recent Labs  Lab 10/02/22 0858  AST 27  ALT 17  ALKPHOS 64  BILITOT 1.5*  PROT 6.1*  ALBUMIN 3.1*   No results for input(s): "LIPASE", "AMYLASE" in the last 168 hours. No results for input(s): "AMMONIA" in the last 168 hours.  CBC: Recent Labs  Lab 10/02/22 0858  WBC 8.1  NEUTROABS 6.1  HGB 16.7  HCT 54.2*  MCV 104.4*  PLT 117*    Cardiac Enzymes: No results for input(s): "CKTOTAL", "CKMB", "CKMBINDEX", "TROPONINI" in the last 168 hours.  BNP: Invalid input(s): "POCBNP"  CBG: No results for input(s): "GLUCAP" in the last 168 hours.  Microbiology: Results for orders placed or performed during the hospital encounter of 01/28/22  Resp Panel by RT-PCR (Flu A&B, Covid)  Nasopharyngeal Swab     Status: None   Collection Time: 01/28/22  7:08 PM   Specimen: Nasopharyngeal Swab; Nasopharyngeal(NP) swabs in vial transport medium  Result Value Ref Range Status   SARS Coronavirus 2 by RT PCR NEGATIVE NEGATIVE Final    Comment: (NOTE) SARS-CoV-2 target nucleic acids are NOT DETECTED.  The SARS-CoV-2 RNA is generally detectable in upper respiratory specimens during the acute phase of infection. The  lowest concentration of SARS-CoV-2 viral copies this assay can detect is 138 copies/mL. A negative result does not preclude SARS-Cov-2 infection and should not be used as the sole basis for treatment or other patient management decisions. A negative result may occur with  improper specimen collection/handling, submission of specimen other than nasopharyngeal swab, presence of viral mutation(s) within the areas targeted by this assay, and inadequate number of viral copies(<138 copies/mL). A negative result must be combined with clinical observations, patient history, and epidemiological information. The expected result is Negative.  Fact Sheet for Patients:  EntrepreneurPulse.com.au  Fact Sheet for Healthcare Providers:  IncredibleEmployment.be  This test is no t yet approved or cleared by the [place] FDA and  has been authorized for detection and/or diagnosis of SARS-CoV-2 by FDA under an Emergency Use Authorization (EUA). This EUA will remain  in effect (meaning this test can be used) for the duration of the COVID-19 declaration under Section 564(b)(1) of the Act, 21 U.S.C.section 360bbb-3(b)(1), unless the authorization is terminated  or revoked sooner.       Influenza A by PCR NEGATIVE NEGATIVE Final   Influenza B by PCR NEGATIVE NEGATIVE Final    Comment: (NOTE) The Xpert Xpress SARS-CoV-2/FLU/RSV plus assay is intended as an aid in the diagnosis of influenza from Nasopharyngeal swab specimens and should not be used as a sole basis for treatment. Nasal washings and aspirates are unacceptable for Xpert Xpress SARS-CoV-2/FLU/RSV testing.  Fact Sheet for Patients: EntrepreneurPulse.com.au  Fact Sheet for Healthcare Providers: IncredibleEmployment.be  This test is not yet approved or cleared by the [place] FDA and has been authorized for detection and/or diagnosis of SARS-CoV-2 by FDA under  an Emergency Use Authorization (EUA). This EUA will remain in effect (meaning this test can be used) for the duration of the COVID-19 declaration under Section 564(b)(1) of the Act, 21 U.S.C. section 360bbb-3(b)(1), unless the authorization is terminated or revoked.  Performed at [facility], [address] [place], [place] [address]   MRSA Next Gen by PCR, Nasal     Status: None   Collection Time: 01/28/22  8:47 PM   Specimen: Nasal Mucosa; Nasal Swab  Result Value Ref Range Status   MRSA by PCR Next Gen NOT DETECTED NOT DETECTED Final    Comment: (NOTE) The GeneXpert MRSA Assay (FDA approved for NASAL specimens only), is one component of a comprehensive MRSA colonization surveillance program. It is not intended to diagnose MRSA infection nor to guide or monitor treatment for MRSA infections. Test performance is not FDA approved in patients less than 78 years old. Performed at [facility], [address] [place], [place] [address]     Coagulation Studies: No results for input(s): "LABPROT", "INR" in the last 72 hours.  Urinalysis: No results for input(s): "COLORURINE", "LABSPEC", "PHURINE", "GLUCOSEU", "HGBUR", "BILIRUBINUR", "KETONESUR", "PROTEINUR", "UROBILINOGEN", "NITRITE", "LEUKOCYTESUR" in the last 72 hours.  Invalid input(s): "APPERANCEUR"    Imaging: DG Chest Port 1 View  Result Date: 10/02/2022 CLINICAL DATA:  Several week history of generalized swelling and shortness of breath  EXAM: PORTABLE CHEST 1 VIEW COMPARISON:  Chest radiograph dated 01/28/2022 FINDINGS: Increased lung volumes with flattening of the hemidiaphragms. No focal consolidations. No pleural effusion or pneumothorax. Similar enlarged cardiomediastinal silhouette. Aortic atherosclerosis. The visualized skeletal structures are unremarkable. IMPRESSION: 1. No focal consolidation. 2. Increased lung volumes with flattening of the hemidiaphragms, which can be seen in the setting of  COPD. 3. Similar cardiomegaly. 4.  Aortic Atherosclerosis (ICD10-I70.0). Electronically Signed   By: Darrin Nipper M.D.   On: 10/02/2022 08:46     Medications:    furosemide     furosemide (LASIX) 200 mg in dextrose 5 % 100 mL (2 mg/mL) infusion        Assessment/ Plan:  [name] is a 76 y.o.  male with past medical conditions including COPD, hyperlipidemia, hypertension, CHF, and chronic kidney disease stage IV.  Patient presents to the emergency room at the advice of nephrologist for evaluation of progressive lower extremity edema.  Patient has been admitted for Acute on chronic diastolic CHF (congestive heart failure) (HCC) [I50.33]   Chronic kidney disease stage IV with patient at baseline renal function.  Chronic kidney disease is secondary to cardiorenal Outpatient labs show fluctuating renal function since early this year. Will place patient on Furosemide drip 8/mg/hr and monitor.   Lab Results  Component Value Date   CREATININE 2.41 (H) 10/02/2022   CREATININE 2.32 (H) 09/06/2022   CREATININE 2.52 (H) 08/30/2022   No intake or output data in the 24 hours ending 10/02/22 1005  2.  Acute on chronic Diastolic heart failure, ECHO from 01/29/22 shows EF 55-60% with severely enlarged right ventricular and moderately elevated pulmonary pressure.  Patient is being admitted with fluid overload Will place patient on Furosemide drip as above.   3. Hyperkalemia Secondary to CKD/Dietary indiscretion Potassium 5.3. Should improve with Furosemide drip.   4. Secondary hyperparathyroidism Patient has history of secondary hyperparathyroidism Patient intact PTH was elevated 162 as an outpatient Patient is on calcitriol as an outpatient We will follow   LOS: 0 Shantelle Breeze 10/18/202310:05 AM   I saw and evaluated the patient with Colon Flattery, NP.  I personally formulated the plan of care.  I agree with the findings and plan as documented in the note except as noted .

## 2022-10-02 NOTE — Assessment & Plan Note (Signed)
This is a chronic issue, etiology is not clear.  Platelet is stable, 111 (112 on 02/06/2022) -Follow-up with CBC

## 2022-10-02 NOTE — ED Triage Notes (Signed)
Pt. To ED via POV for generalized swelling x several weeks. Pt. Has CHF and has been in the hospital recently for pneumonia and CHF exacerbation. Pt's O2 sat in triage 80%, 2L O2 [place] applied.

## 2022-10-02 NOTE — Progress Notes (Signed)
*  PRELIMINARY RESULTS* Echocardiogram 2D Echocardiogram has been performed.  John [name] 10/02/2022, 1:11 PM

## 2022-10-02 NOTE — Assessment & Plan Note (Signed)
Stable.  -As needed albuterol and Mucinex

## 2022-10-02 NOTE — ED Notes (Signed)
Assumed care pt on 4L [place] 100% saturation with No work of breathing. Pt states does not feel when his saturation is low. MD at bedside tritrating O2 down. BLE dependent edema.

## 2022-10-02 NOTE — Assessment & Plan Note (Addendum)
Resolved.  Continue to monitor.

## 2022-10-02 NOTE — ED Provider Notes (Signed)
[facility] Provider Note    Event Date/Time   First MD Initiated Contact with Patient 10/02/22 [phone]     (approximate)   History   Chief Complaint: Shortness of Breath and Leg Swelling (Pt. To ED via POV for generalized swelling x several weeks. Pt. Has CHF and has been in the hospital recently for pneumonia and CHF exacerbation. Pt's O2 sat in triage 80%, 2L O2 [place] applied.)   HPI  John [name] is a 76 y.o. male with a history of COPD, CHF, hypertension, stage IV CKD who comes the ED complaining of worsening leg swelling for the past 2 weeks.  Denies shortness of breath or orthopnea.  Was seen by nephrology yesterday, noted to have about 13 pound weight gain, recommended to come to the ED for admission for IV diuresis on furosemide infusion.  Patient is on high-dose torsemide as well as intermittent metolazone due to his refractory CHF.     Physical Exam   Triage Vital Signs: ED Triage Vitals  Enc Vitals Group     BP 10/02/22 0815 117/76     Pulse Rate 10/02/22 0815 85     Resp 10/02/22 0815 18     Temp 10/02/22 0815 98.3 F (36.8 C)     Temp Source 10/02/22 0815 Oral     SpO2 10/02/22 0815 (!) 80 %     Weight 10/02/22 0815 222 lb (100.7 kg)     Height 10/02/22 0815 '5\' 10"'$  (1.778 m)     Head Circumference --      Peak Flow --      Pain Score 10/02/22 0822 8     Pain Loc --      Pain Edu? --      Excl. in [place]? --     Most recent vital signs: Vitals:   10/02/22 0815 10/02/22 0821  BP: 117/76   Pulse: 85   Resp: 18   Temp: 98.3 F (36.8 C)   SpO2: (!) 80% 91%    General: Awake, no distress.  CV:  Good peripheral perfusion.  Regular rate and rhythm Resp:  Normal effort.  Diminished breath sounds at bilateral bases.  No wheezing or crackles. Abd:  No distention.  Soft nontender Other:  3+ pitting edema bilateral lower extremities, symmetric, no calf tenderness.   ED Results / Procedures / Treatments   Labs (all labs ordered are  listed, but only abnormal results are displayed) Labs Reviewed  COMPREHENSIVE METABOLIC PANEL  CBC WITH DIFFERENTIAL/PLATELET  BRAIN NATRIURETIC PEPTIDE  TROPONIN I (HIGH SENSITIVITY)     EKG Interpreted by me Sinus rhythm rate of 79, normal axis, normal intervals.  Poor R wave progression.  Normal ST segments and T waves.  Frequent PVCs in a trigeminy pattern.   RADIOLOGY Chest x-ray interpreted by me, appears unremarkable.  No frank pulmonary edema or pleural effusion.  Radiology report reviewed.   PROCEDURES:  .Critical Care  Performed by: Carrie Mew, MD Authorized by: Carrie Mew, MD   Critical care provider statement:    Critical care time (minutes):  35   Critical care time was exclusive of:  Separately billable procedures and treating other patients   Critical care was necessary to treat or prevent imminent or life-threatening deterioration of the following conditions:  Renal failure, cardiac failure and respiratory failure   Critical care was time spent personally by me on the following activities:  Development of treatment plan with patient or surrogate, discussions with consultants, evaluation of  patient's response to treatment, examination of patient, obtaining history from patient or surrogate, ordering and performing treatments and interventions, ordering and review of laboratory studies, ordering and review of radiographic studies, pulse oximetry, re-evaluation of patient's condition and review of old charts   Care discussed with: admitting provider   Comments:        .1-3 Lead EKG Interpretation  Performed by: Carrie Mew, MD Authorized by: Carrie Mew, MD      MEDICATIONS ORDERED IN ED: Medications  furosemide (LASIX) 160 mg in dextrose 5 % 50 mL IVPB (has no administration in time range)  furosemide (LASIX) 200 mg in dextrose 5 % 100 mL (2 mg/mL) infusion (has no administration in time range)     IMPRESSION / MDM / [place] / ED COURSE  I reviewed the triage vital signs and the nursing notes.                              Differential diagnosis includes, but is not limited to, decompensated heart failure, non-STEMI, pleural effusion, pulmonary edema, pneumonia, electrolyte abnormality, acute on chronic renal failure, anemia  Patient's presentation is most consistent with severe exacerbation of chronic illness.  Patient presents with pronounced peripheral edema and at least 13 pound weight gain due to decompensated CHF.  He has hypoxic respiratory failure as well, requiring 2 L nasal cannula oxygen at this time.  Ordered a dose of IV Lasix bolus as well as IV Lasix infusion.  Labs appear to be at baseline without acute findings.  Potassium is mildly elevated which I think will improve with diuresis.  Nephrology Dr. Theador Hawthorne notified.  Case discussed with hospitalist for further management.       FINAL CLINICAL IMPRESSION(S) / ED DIAGNOSES   Final diagnoses:  Acute on chronic systolic congestive heart failure (HCC)  Stage 4 chronic kidney disease ([place])     Rx / DC Orders   ED Discharge Orders     None        Note:  This document was prepared using Dragon voice recognition software and may include unintentional dictation errors.   Carrie Mew, MD 10/02/22 1011

## 2022-10-02 NOTE — Assessment & Plan Note (Signed)
Hemoglobin 16.7 -Follow-up with CBC

## 2022-10-02 NOTE — Assessment & Plan Note (Addendum)
Myocardial injury due to CHF exacerbation: Troponin level 32 --> 29.  Denies chest pain.  A1c of 6.3 and LDL of 74 -Continue aspirin.

## 2022-10-02 NOTE — H&P (Addendum)
History and Physical    John [name] XLK:[id number] DOB: Apr 06, 1946 DOA: 10/02/2022  Referring MD/NP/PA:   PCP: Tracie Harrier, MD   Patient coming from:  The patient is coming from home.  At baseline, pt is independent for most of ADL.        Chief Complaint: worsening leg edema and weight gain  HPI: John [name] is a 76 y.o. male with medical history significant of dCHF, CKD-IV, hypertension, hyperlipidemia, COPD, erythrocytosis, thrombocytopenia, cor pulmonale, who presents with worsening leg edema and weight gain.  Patient states that his bilateral leg edema has been progressively worsening for over past 3 weeks.  Patient has gained more than 15 pounds recently.  Patient denies chest pain, cough, shortness breath.  He is not using oxygen at home. He has oxygen desaturation to 80% on room air, which improved to 91 on 2 L oxygen in the ED.  No acute respiratory distress.  Denies nausea vomiting, diarrhea or abdominal pain.  No symptoms of UTI.  Patient was seen by his nephrologist, Dr. Holley Raring in office, and recommended to come to hospital for IV diuretics.  Data reviewed independently and ED Course: pt was found to have BNP 1103, troponin level 32, WBC 8.1, potassium 5.3, renal function is close to recent baseline, temperature normal, blood pressure 117/76, heart rate 85, RR 18.  Chest x-ray showed cardiomegaly, COPD without infiltration.  Patient is admitted to telemetry bed as inpatient.  Dr. Theador Hawthorne of nephrology is consulted.   EKG: I have personally reviewed.  Sinus rhythm, QTc 431, low voltage, PVC, poor R wave progression   Review of Systems:   General: no fevers, chills, no body weight gain, fatigue HEENT: no blurry vision, hearing changes or sore throat Respiratory: no dyspnea, coughing, wheezing CV: no chest pain, no palpitations GI: no nausea, vomiting, abdominal pain, diarrhea, constipation GU: no dysuria, burning on urination, increased urinary frequency,  hematuria  Ext: has leg edema Neuro: no unilateral weakness, numbness, or tingling, no vision change or hearing loss Skin: no rash, no skin tear. MSK: No muscle spasm, no deformity, no limitation of range of movement in spin Heme: No easy bruising.  Travel history: No recent long distant travel.   Allergy: No Known Allergies  Past Medical History:  Diagnosis Date   CHF (congestive heart failure) (HCC)    EF 45% in 2021   Chronic kidney disease    COPD (chronic obstructive pulmonary disease) ([place]) 02/16/2020   Hyperlipidemia    Hypertension    Pneumonia 2021    Past Surgical History:  Procedure Laterality Date   CATARACT EXTRACTION W/PHACO Left 10/31/2021   Procedure: CATARACT EXTRACTION PHACO AND INTRAOCULAR LENS PLACEMENT ([place]) LEFT VISION BLUE 3.45 00:48.0;  Surgeon: Leandrew Koyanagi, MD;  Location: [facility];  Service: Ophthalmology;  Laterality: Left;   CATARACT EXTRACTION W/PHACO Right 11/14/2021   Procedure: CATARACT EXTRACTION PHACO AND INTRAOCULAR LENS PLACEMENT ([facility]) RIGHT 8.59 01:10.5;  Surgeon: Leandrew Koyanagi, MD;  Location: [place];  Service: Ophthalmology;  Laterality: Right;   INGUINAL HERNIA REPAIR Right 02/04/2017   Procedure: HERNIA REPAIR INGUINAL ADULT;  Surgeon: Leonie Green, MD;  Location: [facility];  Service: General;  Laterality: Right;   NO PAST SURGERIES     QUADRICEPS TENDON REPAIR Left 02/15/2020   Procedure: REPAIR QUADRICEP TENDON;  Surgeon: Corky Mull, MD;  Location: [facility];  Service: Orthopedics;  Laterality: Left;    Social History:  reports that he quit smoking about 7 years ago. His smoking  use included cigarettes. He has a 13.25 pack-year smoking history. He has never used smokeless tobacco. He reports current alcohol use. He reports that he does not use drugs.  Family History:  Family History  Problem Relation Age of Onset   Diabetes Mother    Diabetes Brother    Diabetes Maternal Grandmother     Diabetes Paternal Grandmother      Prior to Admission medications   Medication Sig Start Date End Date Taking? Authorizing Provider  aspirin EC 81 MG tablet Take 1 tablet (81 mg total) by mouth daily. Swallow whole. 03/28/22   Alisa Graff, FNP  losartan (COZAAR) 50 MG tablet Take 50 mg by mouth daily. 07/03/22   [provider]  metolazone (ZAROXOLYN) 5 MG tablet Take 5 mg by mouth 2 (two) times a week. On Mon and Fri Patient not taking: Reported on 07/30/2022    [provider]  potassium chloride SA (KLOR-CON M) 20 MEQ tablet Take 1 tablet (20 mEq total) by mouth daily. 02/26/22   Alisa Graff, FNP  torsemide (DEMADEX) 100 MG tablet Take 1 tablet (100 mg total) by mouth daily. Take 1 tablet (100 mg total) by mouth daily 02/06/22   Sidney Ace, MD    Physical Exam: Vitals:   10/02/22 0815 10/02/22 0821 10/02/22 0823  BP: 117/76    Pulse: 85    Resp: 18    Temp: 98.3 F (36.8 C)    TempSrc: Oral    SpO2: (!) 80% 91%   Weight: 100.7 kg  100.7 kg  Height: '5\' 10"'$  (1.778 m)  '5\' 10"'$  (1.778 m)   General: Not in acute distress HEENT:       Eyes: PERRL, EOMI, no scleral icterus.       ENT: No discharge from the ears and nose, no pharynx injection, no tonsillar enlargement.        Neck: positive JVD, no bruit, no mass felt. Heme: No neck lymph node enlargement. Cardiac: S1/S2, RRR, No murmurs, No gallops or rubs. Respiratory: has fine crackles bilaterally GI: Soft, nondistended, nontender, no rebound pain, no organomegaly, BS present. GU: No hematuria Ext: 2+ pitting leg edema bilaterally. 1+DP/PT pulse bilaterally. Musculoskeletal: No joint deformities, No joint redness or warmth, no limitation of ROM in spin. Skin: No rashes.  Neuro: Alert, oriented X3, cranial nerves II-XII grossly intact, moves all extremities normally.  Psych: Patient is not psychotic, no suicidal or hemocidal ideation.  Labs on Admission: I have personally reviewed following labs and  imaging studies  CBC: Recent Labs  Lab 10/02/22 0858  WBC 8.1  NEUTROABS 6.1  HGB 16.7  HCT 54.2*  MCV 104.4*  PLT 790*   Basic Metabolic Panel: Recent Labs  Lab 10/02/22 0858  NA 141  K 5.3*  CL 103  CO2 34*  GLUCOSE 108*  BUN 42*  CREATININE 2.41*  CALCIUM 8.8*   GFR: Estimated Creatinine Clearance: 31 mL/min (A) (by C-G formula based on SCr of 2.41 mg/dL (H)). Liver Function Tests: Recent Labs  Lab 10/02/22 0858  AST 27  ALT 17  ALKPHOS 64  BILITOT 1.5*  PROT 6.1*  ALBUMIN 3.1*   No results for input(s): "LIPASE", "AMYLASE" in the last 168 hours. No results for input(s): "AMMONIA" in the last 168 hours. Coagulation Profile: No results for input(s): "INR", "PROTIME" in the last 168 hours. Cardiac Enzymes: No results for input(s): "CKTOTAL", "CKMB", "CKMBINDEX", "TROPONINI" in the last 168 hours. BNP (last 3 results) No results for input(s): "  PROBNP" in the last 8760 hours. HbA1C: No results for input(s): "HGBA1C" in the last 72 hours. CBG: No results for input(s): "GLUCAP" in the last 168 hours. Lipid Profile: No results for input(s): "CHOL", "HDL", "LDLCALC", "TRIG", "CHOLHDL", "LDLDIRECT" in the last 72 hours. Thyroid Function Tests: No results for input(s): "TSH", "T4TOTAL", "FREET4", "T3FREE", "THYROIDAB" in the last 72 hours. Anemia Panel: No results for input(s): "VITAMINB12", "FOLATE", "FERRITIN", "TIBC", "IRON", "RETICCTPCT" in the last 72 hours. Urine analysis:    Component Value Date/Time   COLORURINE YELLOW (A) 01/30/2022 2331   APPEARANCEUR CLEAR (A) 01/30/2022 2331   LABSPEC 1.008 01/30/2022 2331   PHURINE 5.0 01/30/2022 2331   GLUCOSEU NEGATIVE 01/30/2022 2331   HGBUR NEGATIVE 01/30/2022 2331   BILIRUBINUR NEGATIVE 01/30/2022 2331   KETONESUR NEGATIVE 01/30/2022 2331   PROTEINUR NEGATIVE 01/30/2022 2331   NITRITE NEGATIVE 01/30/2022 2331   LEUKOCYTESUR NEGATIVE 01/30/2022 2331   Sepsis  Labs: '@LABRCNTIP'$ (procalcitonin:4,lacticidven:4) )No results found for this or any previous visit (from the past 240 hour(s)).   Radiological Exams on Admission: DG Chest Port 1 View  Result Date: 10/02/2022 CLINICAL DATA:  Several week history of generalized swelling and shortness of breath EXAM: PORTABLE CHEST 1 VIEW COMPARISON:  Chest radiograph dated 01/28/2022 FINDINGS: Increased lung volumes with flattening of the hemidiaphragms. No focal consolidations. No pleural effusion or pneumothorax. Similar enlarged cardiomediastinal silhouette. Aortic atherosclerosis. The visualized skeletal structures are unremarkable. IMPRESSION: 1. No focal consolidation. 2. Increased lung volumes with flattening of the hemidiaphragms, which can be seen in the setting of COPD. 3. Similar cardiomegaly. 4.  Aortic Atherosclerosis (ICD10-I70.0). Electronically Signed   By: Darrin Nipper M.D.   On: 10/02/2022 08:46      Assessment/Plan Principal Problem:   Acute on chronic diastolic CHF (congestive heart failure) (HCC) Active Problems:   Myocardial injury   COPD (chronic obstructive pulmonary disease) (HCC)   Hypertension   Hyperkalemia   Erythrocytosis   Thrombocytopenia (HCC)   Assessment and Plan: * Acute on chronic diastolic CHF (congestive heart failure) ([place]) 2D echo on 01/29/2022 showed EF 55 to 60%.  Patient has leg edema, positive JVD, crackles on auscultation, elevated BNP 1103.  Patient denies shortness breath, but he has 2 L new oxygen requirement.  Consulted Dr. Theador Hawthorne of renal.  -Will admit to tele bed as inpatient -Lasix 160 mg IV x 1, then lasix gtt at '8mg'$ /h -2d echo -Daily weights -strict I/O's -Low salt diet -Fluid restriction -Obtain REDs Vest reading  Myocardial injury Myocardial injury due to CHF exacerbation: Troponin level 32 --> 29.  Denies chest pain. -Continue aspirin. -Check A1c, FLP  COPD (chronic obstructive pulmonary disease) (HCC) Stable.  -As needed albuterol and  Mucinex  Hypertension - IV hydralazine as needed -Continue Cozaar  Hyperkalemia Potassium 5.3 -5 g of Lokelma -Patient is on Lasix drip -recheck K level at 8:00 PM  Erythrocytosis Hemoglobin 16.7 -Follow-up with CBC  Thrombocytopenia ([place]) This is a chronic issue, etiology is not clear.  Platelet is stable, 111 (112 on 02/06/2022) -Follow-up with CBC          DVT ppx:  SQ Lovenox  Code Status: Full code  Family Communication:    Yes, patient's  wife  at bed side.     Disposition Plan:  Anticipate discharge back to previous environment  Consults called:  Dr. Theador Hawthorne of nephrology is consulted.  Admission status and Level of care: Telemetry Cardiac:  as inpt     Dispo: The patient is from: Home  Anticipated d/c is to: Home              Anticipated d/c date is: 2 days              Patient currently is not medically stable to d/c.    Severity of Illness:  The appropriate patient status for this patient is INPATIENT. Inpatient status is judged to be reasonable and necessary in order to provide the required intensity of service to ensure the patient's safety. The patient's presenting symptoms, physical exam findings, and initial radiographic and laboratory data in the context of their chronic comorbidities is felt to place them at high risk for further clinical deterioration. Furthermore, it is not anticipated that the patient will be medically stable for discharge from the hospital within 2 midnights of admission.   * I certify that at the point of admission it is my clinical judgment that the patient will require inpatient hospital care spanning beyond 2 midnights from the point of admission due to high intensity of service, high risk for further deterioration and high frequency of surveillance required.*       Date of Service 10/02/2022    Ivor Costa [facility]   If 7PM-7AM, please contact night-coverage [email or web address] 10/02/2022, 1:47  PM

## 2022-10-02 NOTE — Assessment & Plan Note (Signed)
-   IV hydralazine as needed -Continue Cozaar

## 2022-10-02 NOTE — Assessment & Plan Note (Addendum)
2D echo on 01/29/2022 showed EF 55 to 60%.  Patient has leg edema, positive JVD, crackles on auscultation, elevated BNP 1103.  Patient denies shortness breath, but he has 2 L new oxygen requirement.  Consulted nephrology. -Continue with Lasix infusion -Daily weights -strict I/O's -Low salt diet -Fluid restriction

## 2022-10-02 NOTE — ED Notes (Signed)
Saturation down to 85% on room air O2 applied 93% on 5L [place] . PT was never short of breath or working to breath. Unaware oxygen was lower. Pt states this is his norm after hospital admission with PNA.

## 2022-10-03 ENCOUNTER — Encounter: Payer: Self-pay | Admitting: Internal Medicine

## 2022-10-03 DIAGNOSIS — I5033 Acute on chronic diastolic (congestive) heart failure: Secondary | ICD-10-CM | POA: Diagnosis not present

## 2022-10-03 LAB — BASIC METABOLIC PANEL
Anion gap: 10 (ref 5–15)
BUN: 37 mg/dL — ABNORMAL HIGH (ref 8–23)
CO2: 31 mmol/L (ref 22–32)
Calcium: 8.9 mg/dL (ref 8.9–10.3)
Chloride: 97 mmol/L — ABNORMAL LOW (ref 98–111)
Creatinine, Ser: 2.25 mg/dL — ABNORMAL HIGH (ref 0.61–1.24)
GFR, Estimated: 29 mL/min — ABNORMAL LOW (ref >60)
Glucose, Bld: 99 mg/dL (ref 70–99)
Potassium: 5.7 mmol/L — ABNORMAL HIGH (ref 3.5–5.1)
Sodium: 138 mmol/L (ref 135–145)

## 2022-10-03 LAB — MAGNESIUM: Magnesium: 2.6 mg/dL — ABNORMAL HIGH (ref 1.7–2.4)

## 2022-10-03 LAB — CBC
HCT: 54.9 % — ABNORMAL HIGH (ref 39.0–52.0)
Hemoglobin: 16.9 g/dL (ref 13.0–17.0)
MCH: 32.6 pg (ref 26.0–34.0)
MCHC: 30.8 g/dL (ref 30.0–36.0)
MCV: 106 fL — ABNORMAL HIGH (ref 80.0–100.0)
Platelets: 98 10*3/uL — ABNORMAL LOW (ref 150–400)
RBC: 5.18 MIL/uL (ref 4.22–5.81)
RDW: 14.2 % (ref 11.5–15.5)
WBC: 8.3 10*3/uL (ref 4.0–10.5)
nRBC: 0 % (ref 0.0–0.2)

## 2022-10-03 LAB — LIPID PANEL
Cholesterol: 141 mg/dL (ref 0–200)
HDL: 40 mg/dL — ABNORMAL LOW (ref >40)
LDL Cholesterol: 86 mg/dL (ref 0–99)
Total CHOL/HDL Ratio: 3.5 RATIO
Triglycerides: 74 mg/dL (ref <150)
VLDL: 15 mg/dL (ref 0–40)

## 2022-10-03 LAB — HEMOGLOBIN A1C
Hgb A1c MFr Bld: 6.3 % — ABNORMAL HIGH (ref 4.8–5.6)
Mean Plasma Glucose: 134.11 mg/dL

## 2022-10-03 MED ORDER — IPRATROPIUM-ALBUTEROL 0.5-2.5 (3) MG/3ML IN SOLN: 3.0000 mL | RESPIRATORY_TRACT | Status: DC | PRN

## 2022-10-03 MED ORDER — SODIUM ZIRCONIUM CYCLOSILICATE 10 G PO PACK
10.0000 g | PACK | Freq: Every day | ORAL | Status: AC
  Administered 2022-10-03 – 2022-10-05 (×3): 10 g via ORAL
  Filled 2022-10-03 (×3): qty 1

## 2022-10-03 MED ORDER — ONDANSETRON HCL 4 MG/2ML IJ SOLN: 4.0000 mg | Freq: Four times a day (QID) | INTRAMUSCULAR | Status: DC | PRN

## 2022-10-03 MED ORDER — HYDRALAZINE HCL 20 MG/ML IJ SOLN: 10.0000 mg | INTRAMUSCULAR | Status: DC | PRN

## 2022-10-03 MED ORDER — ENOXAPARIN SODIUM 40 MG/0.4ML IJ SOSY
40.0000 mg | PREFILLED_SYRINGE | INTRAMUSCULAR | Status: DC
  Administered 2022-10-03 – 2022-10-14 (×12): 40 mg via SUBCUTANEOUS
  Filled 2022-10-03 (×12): qty 0.4

## 2022-10-03 MED ORDER — TRAZODONE HCL 50 MG PO TABS
50.0000 mg | ORAL_TABLET | Freq: Every evening | ORAL | Status: DC | PRN
  Administered 2022-10-06: 50 mg via ORAL
  Filled 2022-10-03: qty 1

## 2022-10-03 MED ORDER — METOPROLOL TARTRATE 5 MG/5ML IV SOLN: 5.0000 mg | INTRAVENOUS | Status: DC | PRN

## 2022-10-03 MED ORDER — SENNOSIDES-DOCUSATE SODIUM 8.6-50 MG PO TABS: 1.0000 | ORAL_TABLET | Freq: Every evening | ORAL | Status: DC | PRN

## 2022-10-03 NOTE — Progress Notes (Signed)
PROGRESS NOTE    John [name]  QRF:[id number] DOB: 01/04/1946 DOA: 10/02/2022 PCP: Tracie Harrier, MD   Brief Narrative:  76 year old with history of diastolic CHF, CKD stage IV, HTN, HLD, COPD, erythrocytosis, cor pulmonale admitted for lower extremity edema and weight gain.  Minimal shortness of breath but requiring 2 L nasal cannula in the ED due to hypoxia.  Patient was seen by his nephrologist Dr. Zollie Scale outpatient and was advised to come to the hospital for IV diuretics.  Chest x-ray showed cardiomegaly, nephrology team was consulted.   Assessment & Plan:  Principal Problem:   Acute on chronic diastolic CHF (congestive heart failure) (HCC) Active Problems:   Myocardial injury   COPD (chronic obstructive pulmonary disease) (HCC)   Hypertension   Hyperkalemia   Erythrocytosis   Thrombocytopenia (HCC)     Assessment and Plan: * Acute on chronic diastolic CHF (congestive heart failure) ([place]) 2D echo on 01/29/2022 showed EF 55 to 60%.  Patient has leg edema, positive JVD, crackles on auscultation, elevated BNP 1103.  Patient denies shortness breath, but he has 2 L new oxygen requirement.  Patient seen by nephrology team who recommended starting on Lasix drip.  Monitor output and electrolytes.  Replete as necessary.  Elevate legs Echocardiogram-EF 50% with right ventricular volume overload with LA dilation, moderate TR.   Myocardial injury Troponins remain flat at this time.  Suspect mild demand ischemia -Continue aspirin. LDL 74, A1c 6.3  COPD (chronic obstructive pulmonary disease) (HCC) Stable.  -As needed albuterol and Mucinex  Hypertension - IV hydralazine as needed -Continue Cozaar  Hyperkalemia Potassium 5.3 > 5.7.  No obvious EKG changes.  We will continue Lokelma at this time, getting Lasix so that should help as well  Erythrocytosis Hemoglobin 16.7 -Follow-up with CBC  Thrombocytopenia ([place]) Chronic issue, no obvious evidence of infection.        DVT prophylaxis: Lovenox Code Status: Full code Family Communication:    Status is: Inpatient Currently signs of significant volume overload and on Lasix drip.  Nephrology team following  Subjective: Tells me he feels a little better today compared to yesterday but still has significant lower extremity swelling   Examination:  General exam: Appears calm and comfortable  Respiratory system: Clear to auscultation. Respiratory effort normal. Cardiovascular system: S1 & S2 heard, RRR. No JVD, murmurs, rubs, gallops or clicks.  2+ bilateral lower extremity pitting edema Gastrointestinal system: Abdomen is nondistended, soft and nontender. No organomegaly or masses felt. Normal bowel sounds heard. Central nervous system: Alert and oriented. No focal neurological deficits. Extremities: Symmetric 5 x 5 power. Skin: No rashes, lesions or ulcers Psychiatry: Judgement and insight appear normal. Mood & affect appropriate.     Objective: Vitals:   10/03/22 0556 10/03/22 0600 10/03/22 0615 10/03/22 0700  BP: 102/71 107/73  100/68  Pulse: 86 87 82 86  Resp: (!) 23 (!) 23 19 (!) 27  Temp:      TempSrc:      SpO2: 94%  100% 100%  Weight:      Height:        Intake/Output Summary (Last 24 hours) at 10/03/2022 0823 Last data filed at 10/03/2022 0500 Gross per 24 hour  Intake --  Output 2600 ml  Net -2600 ml   Filed Weights   10/02/22 0815 10/02/22 0823  Weight: 100.7 kg 100.7 kg     Data Reviewed:   CBC: Recent Labs  Lab 10/02/22 0858  WBC 8.1  NEUTROABS 6.1  HGB 16.7  HCT  54.2*  MCV 104.4*  PLT 053*   Basic Metabolic Panel: Recent Labs  Lab 10/02/22 0858 10/03/22 0010  NA 141 138  K 5.3* 5.7*  CL 103 97*  CO2 34* 31  GLUCOSE 108* 99  BUN 42* 37*  CREATININE 2.41* 2.25*  CALCIUM 8.8* 8.9  MG  --  2.6*   GFR: Estimated Creatinine Clearance: 33.2 mL/min (A) (by C-G formula based on SCr of 2.25 mg/dL (H)). Liver Function Tests: Recent Labs  Lab  10/02/22 0858  AST 27  ALT 17  ALKPHOS 64  BILITOT 1.5*  PROT 6.1*  ALBUMIN 3.1*   No results for input(s): "LIPASE", "AMYLASE" in the last 168 hours. No results for input(s): "AMMONIA" in the last 168 hours. Coagulation Profile: No results for input(s): "INR", "PROTIME" in the last 168 hours. Cardiac Enzymes: No results for input(s): "CKTOTAL", "CKMB", "CKMBINDEX", "TROPONINI" in the last 168 hours. BNP (last 3 results) No results for input(s): "PROBNP" in the last 8760 hours. HbA1C: Recent Labs    10/02/22 2316  HGBA1C 6.3*   CBG: No results for input(s): "GLUCAP" in the last 168 hours. Lipid Profile: Recent Labs    10/03/22 0010  CHOL 141  HDL 40*  LDLCALC 86  TRIG 74  CHOLHDL 3.5   Thyroid Function Tests: No results for input(s): "TSH", "T4TOTAL", "FREET4", "T3FREE", "THYROIDAB" in the last 72 hours. Anemia Panel: No results for input(s): "VITAMINB12", "FOLATE", "FERRITIN", "TIBC", "IRON", "RETICCTPCT" in the last 72 hours. Sepsis Labs: No results for input(s): "PROCALCITON", "LATICACIDVEN" in the last 168 hours.  No results found for this or any previous visit (from the past 240 hour(s)).       Radiology Studies: ECHOCARDIOGRAM COMPLETE  Result Date: 10/02/2022    ECHOCARDIOGRAM REPORT   Patient Name:   John [name] Date of Exam: 10/02/2022 Medical Rec #:  [id number]      Height:       70.0 in Accession #:    [id number]     Weight:       222.0 lb Date of Birth:  12-Sep-1946     BSA:          2.182 m Patient Age:    77 years       BP:           117/76 mmHg Patient Gender: M              HR:           85 bpm. Exam Location:  [facility] Procedure: 2D Echo, Cardiac Doppler and Color Doppler Indications:     CHF- acute diastolic H29.92  History:         Patient has prior history of Echocardiogram examinations, most                  recent 01/29/2022. CHF, COPD; Risk Factors:Hypertension.                  CKD---stage 4, pneumonia.  Sonographer:     Sherrie Sport Referring  Phys:  [id number] Ivor Costa Diagnosing Phys: Yolonda Kida MD  Sonographer Comments: Suboptimal apical window. IMPRESSIONS  1. Left ventricular ejection fraction, by estimation, is 50 to 55%. The left ventricle has low normal function. The left ventricle has no regional wall motion abnormalities. There is mild left ventricular hypertrophy. Left ventricular diastolic parameters are consistent with Grade I diastolic dysfunction (impaired relaxation). There is the interventricular septum is flattened in diastole ('D' shaped left ventricle), consistent with right  ventricular volume overload.  2. Right ventricular systolic function is moderately reduced. The right ventricular size is moderately enlarged.  3. Left atrial size was mildly dilated.  4. Right atrial size was moderately dilated.  5. The mitral valve is normal in structure. No evidence of mitral valve regurgitation.  6. Tricuspid valve regurgitation is mild to moderate.  7. The aortic valve is normal in structure. Aortic valve regurgitation is not visualized. FINDINGS  Left Ventricle: Left ventricular ejection fraction, by estimation, is 50 to 55%. The left ventricle has low normal function. The left ventricle has no regional wall motion abnormalities. The left ventricular internal cavity size was normal in size. There is mild left ventricular hypertrophy. The interventricular septum is flattened in diastole ('D' shaped left ventricle), consistent with right ventricular volume overload. Left ventricular diastolic parameters are consistent with Grade I diastolic dysfunction (impaired relaxation). Right Ventricle: The right ventricular size is moderately enlarged. No increase in right ventricular wall thickness. Right ventricular systolic function is moderately reduced. Left Atrium: Left atrial size was mildly dilated. Right Atrium: Right atrial size was moderately dilated. Pericardium: There is no evidence of pericardial effusion. Mitral Valve: The mitral valve is  normal in structure. No evidence of mitral valve regurgitation. Tricuspid Valve: The tricuspid valve is normal in structure. Tricuspid valve regurgitation is mild to moderate. Aortic Valve: The aortic valve is normal in structure. Aortic valve regurgitation is not visualized. Aortic valve mean gradient measures 2.0 mmHg. Aortic valve peak gradient measures 4.5 mmHg. Aortic valve area, by VTI measures 2.76 cm. Pulmonic Valve: The pulmonic valve was normal in structure. Pulmonic valve regurgitation is not visualized. Aorta: The ascending aorta was not well visualized. IAS/Shunts: No atrial level shunt detected by color flow Doppler.  LEFT VENTRICLE PLAX 2D LVIDd:         3.40 cm   Diastology LVIDs:         2.50 cm   LV e' medial:    4.35 cm/s LV PW:         1.30 cm   LV E/e' medial:  11.1 LV IVS:        1.30 cm   LV e' lateral:   8.59 cm/s LVOT diam:     2.10 cm   LV E/e' lateral: 5.6 LV SV:         55 LV SV Index:   25 LVOT Area:     3.46 cm  RIGHT VENTRICLE RV Basal diam:  5.10 cm RV Mid diam:    4.80 cm RV S prime:     11.40 cm/s TAPSE (M-mode): 2.4 cm LEFT ATRIUM         Index       RIGHT ATRIUM           Index LA diam:    2.90 cm 1.33 cm/m  RA Area:     30.70 cm                                 RA Volume:   125.00 ml 57.29 ml/m  AORTIC VALVE                    PULMONIC VALVE AV Area (Vmax):    2.67 cm     PR End Diast Vel: 8.53 msec AV Area (Vmean):   2.55 cm AV Area (VTI):     2.76 cm AV Vmax:  106.00 cm/s AV Vmean:          70.600 cm/s AV VTI:            0.201 m AV Peak Grad:      4.5 mmHg AV Mean Grad:      2.0 mmHg LVOT Vmax:         81.80 cm/s LVOT Vmean:        51.900 cm/s LVOT VTI:          0.160 m LVOT/AV VTI ratio: 0.80  AORTA Ao Root diam: 4.42 cm MITRAL VALVE               TRICUSPID VALVE MV Area (PHT): 3.27 cm    TR Peak grad:   24.0 mmHg MV Decel Time: 232 msec    TR Vmax:        245.00 cm/s MV E velocity: 48.20 cm/s MV A velocity: 67.60 cm/s  SHUNTS MV E/A ratio:  0.71         Systemic VTI:  0.16 m                            Systemic Diam: 2.10 cm Yolonda Kida MD Electronically signed by Yolonda Kida MD Signature Date/Time: 10/02/2022/4:23:07 PM    Final    DG Chest Port 1 View  Result Date: 10/02/2022 CLINICAL DATA:  Several week history of generalized swelling and shortness of breath EXAM: PORTABLE CHEST 1 VIEW COMPARISON:  Chest radiograph dated 01/28/2022 FINDINGS: Increased lung volumes with flattening of the hemidiaphragms. No focal consolidations. No pleural effusion or pneumothorax. Similar enlarged cardiomediastinal silhouette. Aortic atherosclerosis. The visualized skeletal structures are unremarkable. IMPRESSION: 1. No focal consolidation. 2. Increased lung volumes with flattening of the hemidiaphragms, which can be seen in the setting of COPD. 3. Similar cardiomegaly. 4.  Aortic Atherosclerosis (ICD10-I70.0). Electronically Signed   By: Darrin Nipper M.D.   On: 10/02/2022 08:46        Scheduled Meds:  aspirin EC  81 mg Oral Daily   enoxaparin (LOVENOX) injection  30 mg Subcutaneous Q24H   losartan  50 mg Oral Daily   sodium zirconium cyclosilicate  10 g Oral Daily   Continuous Infusions:  furosemide (LASIX) 200 mg in dextrose 5 % 100 mL (2 mg/mL) infusion 8 mg/hr (10/03/22 0750)     LOS: 1 day   Time spent= 35 mins    Latroy Gaymon Arsenio Loader, MD [facility]  If 7PM-7AM, please contact night-coverage  10/03/2022, 8:23 AM

## 2022-10-03 NOTE — ED Notes (Signed)
Lab at bedside

## 2022-10-03 NOTE — ED Notes (Signed)
Pt transferred back to bed. Refusing to change pants despite being saturated.

## 2022-10-03 NOTE — ED Notes (Signed)
This RN to bedside for pump alarm ringing. Pump restarted, pt. Sleeping. Pt's spouse notified that pt. Had been waiting for her to bring him a brief from home. Pt's spouse states she did not bring one. This RN provided a hospital brief to pt's spouse.

## 2022-10-03 NOTE — ED Notes (Signed)
PT and OT at bedside

## 2022-10-03 NOTE — ED Notes (Signed)
Dr. Reesa Chew notified that pt. Is refusing any further blood draws. Lab came earlier to draw blood but was unable to obtain any, will attempt again at a later time.

## 2022-10-03 NOTE — Evaluation (Signed)
Occupational Therapy Evaluation Patient Details Name: John [name] MRN: [id number] DOB: 31-Oct-1946 Today's Date: 10/03/2022   History of Present Illness Pt is a 76 year old with history of diastolic CHF, CKD stage IV, HTN, HLD, COPD, erythrocytosis, cor pulmonale admitted for lower extremity edema and weight gain.  Minimal shortness of breath but requiring 2 L nasal cannula in the ED due to hypoxia.  Patient was seen by his nephrologist Dr. Zollie Scale outpatient and was advised to come to the hospital for IV diuretics.   Clinical Impression   Pt was seen for OT evaluation and co-tx with PT to optimize pt outcomes and safety this date. Prior to hospital admission, pt was independent and playing tennis per pt/roommate report. Pt lives with roommate/friend in a [facility]. Pt presents to acute OT demonstrating impaired ADL performance and functional mobility 2/2 BLE edema, decreased strength, and decreased balance (See OT problem list). Pt currently requires CGA +2 for safety for ADL transfers, improved balance with RW versus without and PRN MIN A for LB ADL tasks. SpO2 >91% throughout. Pt would benefit from skilled OT services to address noted impairments and functional limitations (see below for any additional details) in order to maximize safety and independence while minimizing falls risk and caregiver burden. Upon hospital discharge, recommend intermittent assist at home to maximize safety.      Recommendations for follow up therapy are one component of a multi-disciplinary discharge planning process, led by the attending physician.  Recommendations may be updated based on patient status, additional functional criteria and insurance authorization.   Follow Up Recommendations  No OT follow up    Assistance Recommended at Discharge PRN  Patient can return home with the following A little help with bathing/dressing/bathroom;A little help with walking and/or transfers    Functional Status Assessment   Patient has had a recent decline in their functional status and demonstrates the ability to make significant improvements in function in a reasonable and predictable amount of time.  Equipment Recommendations  None recommended by OT    Recommendations for Other Services       Precautions / Restrictions Precautions Precautions: Fall Restrictions Weight Bearing Restrictions: No      Mobility Bed Mobility               General bed mobility comments: up in recliner at start of session    Transfers Overall transfer level: Needs assistance Equipment used: Rolling walker (2 wheels) Transfers: Sit to/from Stand Sit to Stand: Min guard, +2 safety/equipment                  Balance Overall balance assessment: Needs assistance Sitting-balance support: Feet supported Sitting balance-Leahy Scale: Good     Standing balance support: During functional activity Standing balance-Leahy Scale: Fair Standing balance comment: improvement noted in dynamic balance with BUE support                           ADL either performed or assessed with clinical judgement   ADL                                         General ADL Comments: Pt currently requires PRN MIN A for LB ADL tasks, CGA for ADL Transfers (noted improvement in balance/safety with RW vs without)     Vision         Perception  Praxis      Pertinent Vitals/Pain Pain Assessment Pain Assessment: No/denies pain     Hand Dominance     Extremity/Trunk Assessment Upper Extremity Assessment Upper Extremity Assessment: Generalized weakness   Lower Extremity Assessment Lower Extremity Assessment: Generalized weakness (bilat edema)       Communication     Cognition Arousal/Alertness: Lethargic Behavior During Therapy: WFL for tasks assessed/performed, Flat affect Overall Cognitive Status: Within Functional Limits for tasks assessed                                  General Comments: friend in room endorses he is normally sleepy like he is currently     General Comments       Exercises     Shoulder Instructions      Home Living Family/patient expects to be discharged to:: Private residence Living Arrangements: Non-relatives/Friends Available Help at Discharge: Friend(s) Type of Home: House Home Access: Stairs to enter Technical brewer of Steps: 3   Home Layout: One level               Home Equipment: Cane - single Barista (2 wheels)          Prior Functioning/Environment Prior Level of Function : Independent/Modified Independent             Mobility Comments: apparently he was playing tennis multiple times/wk, able to drive, run errands, etc ADLs Comments: indep        OT Problem List: Decreased strength;Cardiopulmonary status limiting activity;Impaired balance (sitting and/or standing);Decreased knowledge of use of DME or AE      OT Treatment/Interventions: Self-care/ADL training;Therapeutic exercise;Therapeutic activities;DME and/or AE instruction;Patient/family education;Balance training    OT Goals(Current goals can be found in the care plan section) Acute Rehab OT Goals Patient Stated Goal: get better and go home OT Goal Formulation: With patient Time For Goal Achievement: 10/17/22 Potential to Achieve Goals: Good ADL Goals Pt Will Perform Lower Body Dressing: with modified independence;sit to/from stand Pt Will Transfer to Toilet: with modified independence;ambulating (LRAD)  OT Frequency: Min 2X/week    Co-evaluation PT/OT/SLP Co-Evaluation/Treatment: Yes Reason for Co-Treatment: For patient/therapist safety;To address functional/ADL transfers PT goals addressed during session: Mobility/safety with mobility;Balance;Proper use of DME OT goals addressed during session: ADL's and self-care      AM-PAC OT "6 Clicks" Daily Activity     Outcome Measure Help from another person eating  meals?: None Help from another person taking care of personal grooming?: None Help from another person toileting, which includes using toliet, bedpan, or urinal?: A Little Help from another person bathing (including washing, rinsing, drying)?: A Little Help from another person to put on and taking off regular upper body clothing?: None Help from another person to put on and taking off regular lower body clothing?: A Little 6 Click Score: 21   End of Session Equipment Utilized During Treatment: Gait belt;Oxygen;Rolling walker (2 wheels) Nurse Communication: Mobility status  Activity Tolerance: Patient tolerated treatment well Patient left: in chair;with call bell/phone within reach;with family/visitor present  OT Visit Diagnosis: Other abnormalities of gait and mobility (R26.89);Muscle weakness (generalized) (M62.81)                Time: 7408-1448 OT Time Calculation (min): 15 min Charges:  OT General Charges $OT Visit: 1 Visit OT Evaluation $OT Eval Low Complexity: 1 Low  Ardeth Perfect., MPH, MS, OTR/L ascom [phone] 10/03/22, 2:21 PM

## 2022-10-03 NOTE — ED Notes (Signed)
This RN to bedside, pt. Sleeping. This RN woke pt. With difficulty. When pt. Fully awake, he is alert and oriented. Pt. Once again refuses to have brief changed. This RN educates pt. Again about importance of elevating legs. Pt. Is reluctant to follow advice of RN.

## 2022-10-03 NOTE — Evaluation (Signed)
Physical Therapy Evaluation Patient Details Name: John [name] MRN: [id number] DOB: Nov 26, 1946 Today's Date: 10/03/2022  History of Present Illness  Pt is a 76 year old with history of diastolic CHF, CKD stage IV, HTN, HLD, COPD, erythrocytosis, cor pulmonale admitted for lower extremity edema and weight gain.  Minimal shortness of breath but requiring 2 L nasal cannula in the ED due to hypoxia.  Patient was seen by his nephrologist Dr. Zollie Scale outpatient and was advised to come to the hospital for IV diuretics.   Clinical Impression  Pt cleared by MD for PT evaluation (elevated K+). Pt and friend in room endorsed at baseline the pt is independent, plays tennis. Pt lethargic throughout but friend stated this is normal for him at baseline.  Seen as PT/OT co-evaluation to maximize safety and participation, pt up in chair at start and end of session. Sit <> stand with RW initially, CGAx2. Able to ambulate ~18f with CGA (limited by O2 line). He stated he did not need the RW, performed another 170fwithout but did display some unsteadiness/shuffled step that BUE support may be safer at this time.  Overall the patient demonstrated deficits (see "PT Problem List") that impede the patient's functional abilities, safety, and mobility and would benefit from skilled PT intervention. Recommendation is HHPT with intermittent supervision pending further progress with mobility.      Recommendations for follow up therapy are one component of a multi-disciplinary discharge planning process, led by the attending physician.  Recommendations may be updated based on patient status, additional functional criteria and insurance authorization.  Follow Up Recommendations Home health PT      Assistance Recommended at Discharge Intermittent Supervision/Assistance  Patient can return home with the following  A little help with bathing/dressing/bathroom;Assistance with cooking/housework;Assist for transportation;Help  with stairs or ramp for entrance    Equipment Recommendations Other (comment) (TBD)  Recommendations for Other Services       Functional Status Assessment Patient has had a recent decline in their functional status and demonstrates the ability to make significant improvements in function in a reasonable and predictable amount of time.     Precautions / Restrictions Precautions Precautions: Fall Restrictions Weight Bearing Restrictions: No      Mobility  Bed Mobility               General bed mobility comments: up in recliner at start of session    Transfers Overall transfer level: Needs assistance Equipment used: Rolling walker (2 wheels) Transfers: Sit to/from Stand Sit to Stand: Min guard, +2 safety/equipment                Ambulation/Gait Ambulation/Gait assistance: Min guard Gait Distance (Feet):  (1228fadditional 33f37fimited by lines/leads) Assistive device: Rolling walker (2 wheels), None         General Gait Details: able to ambulate without RW but did seem a bit more unbalanced, carried by [email or web address]        Wheelchair Mobility    Modified Rankin (Stroke Patients Only)       Balance Overall balance assessment: Needs assistance Sitting-balance support: Feet supported Sitting balance-Leahy Scale: Good     Standing balance support: During functional activity Standing balance-Leahy Scale: Fair Standing balance comment: improvement noted in dynamic balance with BUE support                             Pertinent Vitals/Pain Pain Assessment Pain Assessment: No/denies  pain    Home Living Family/patient expects to be discharged to:: Private residence Living Arrangements: Non-relatives/Friends Available Help at Discharge: Friend(s) Type of Home: House Home Access: Stairs to enter   Technical brewer of Steps: 3   Home Layout: One level Home Equipment: [place] - single Barista (2 wheels)       Prior Function Prior Level of Function : Independent/Modified Independent             Mobility Comments: apparently he was playing tennis multiple times/wk, able to drive, run errands, etc       Hand Dominance        Extremity/Trunk Assessment   Upper Extremity Assessment Upper Extremity Assessment: Defer to OT evaluation    Lower Extremity Assessment Lower Extremity Assessment: Generalized weakness (noted for edema bilaterally)       Communication      Cognition Arousal/Alertness: Lethargic Behavior During Therapy: WFL for tasks assessed/performed, Flat affect Overall Cognitive Status: Within Functional Limits for tasks assessed                                 General Comments: friend in room endorses he is normally sleepy like he is currently        General Comments      Exercises     Assessment/Plan    PT Assessment Patient needs continued PT services  PT Problem List Decreased strength;Decreased mobility;Decreased activity tolerance;Decreased balance;Decreased knowledge of use of DME       PT Treatment Interventions DME instruction;Therapeutic exercise;Gait training;Balance training;Stair training;Neuromuscular re-education;Functional mobility training;Therapeutic activities;Patient/family education    PT Goals (Current goals can be found in the Care Plan section)  Acute Rehab PT Goals Patient Stated Goal: to rest PT Goal Formulation: With patient Time For Goal Achievement: 10/17/22 Potential to Achieve Goals: Good    Frequency Min 2X/week     Co-evaluation PT/OT/SLP Co-Evaluation/Treatment: Yes Reason for Co-Treatment: For patient/therapist safety;To address functional/ADL transfers PT goals addressed during session: Mobility/safety with mobility;Balance;Proper use of DME OT goals addressed during session: ADL's and self-care       AM-PAC PT "6 Clicks" Mobility  Outcome Measure Help needed turning from your back to your  side while in a flat bed without using bedrails?: A Little Help needed moving from lying on your back to sitting on the side of a flat bed without using bedrails?: A Little Help needed moving to and from a bed to a chair (including a wheelchair)?: A Little Help needed standing up from a chair using your arms (e.g., wheelchair or bedside chair)?: None Help needed to walk in hospital room?: None Help needed climbing 3-5 steps with a railing? : A Little 6 Click Score: 20    End of Session Equipment Utilized During Treatment: Gait belt;Oxygen Activity Tolerance: Patient tolerated treatment well Patient left: in chair;with call bell/phone within reach;with family/visitor present Nurse Communication: Mobility status PT Visit Diagnosis: Other abnormalities of gait and mobility (R26.89);Difficulty in walking, not elsewhere classified (R26.2);Muscle weakness (generalized) (M62.81)    Time: 9983-3825 PT Time Calculation (min) (ACUTE ONLY): 15 min   Charges:   PT Evaluation $PT Eval Low Complexity: 1 Low         Lieutenant Diego PT, DPT 2:05 PM,10/03/22

## 2022-10-03 NOTE — ED Notes (Signed)
This RN to bedside, first contact with pt. Pt. Sleeping, chest rise and fall. NAD. This RN will assess temp when pt. Wakes up.

## 2022-10-03 NOTE — ED Notes (Signed)
This RN obtained recliner for pt. In an effort to keep his feet elevated. Pt assisted to recliner, assist x1. Pt moving slowly, but steady.

## 2022-10-03 NOTE — ED Notes (Signed)
This RN to bedside, pt. Sitting up on bedside, states he is fine and doesn't need anything. This RN updated pt on his plan of care. This RN emphasized need for caution on pt's part to avoid falls. Pt. States he is fine, and will call if he needs help. This RN obtains verbal contract from pt. That he will not attempt to get out of bed without help to avoid falls. When pt. Was moving around prior to RN entering room, O2 [place] was displaced on his face and pulse ox sat reading 77%. [place] replaced by this RN, and pt. Encouraged to breathe through his nose. Pt's sat recovered to 90%. This RN encouraged pt. To keep [place] fully in his nose as his oxygen sat dropped quickly when it was displaced. Pt. Verbalized understanding.

## 2022-10-03 NOTE — Progress Notes (Signed)
Pharmacy Lovenox Dosing  76 y.o. male admitted with Shortness of Breath and Leg Swelling (Pt. To ED via POV for generalized swelling x several weeks. Pt. Has CHF and has been in the hospital recently for pneumonia and CHF exacerbation. Pt's O2 sat in triage 80%, 2L O2 [place] applied.) . Patient ordered Lovenox 30 mg daily for VTE prophylaxis.   Filed Weights   10/02/22 0815 10/02/22 0823  Weight: 100.7 kg (222 lb) 100.7 kg (222 lb)    Body mass index is 31.85 kg/m.  Estimated Creatinine Clearance: 33.2 mL/min (A) (by C-G formula based on SCr of 2.25 mg/dL (H)).  Will adjust Lovenox dosing to 40 mg Q24h.  Mckell Riecke A Clay Solum 10/03/2022 10:18 AM

## 2022-10-03 NOTE — ED Notes (Signed)
This RN to bedside to replace pt's pulse ox monitor. Pt. Sitting on side of bed, laying back and sleeping. This RN assisted pt. To place bilat legs up on bed, and elevate foot of bed. 254m urine emptied from urinal. Pt. Has pitting edema in bilat legs.

## 2022-10-03 NOTE — ED Notes (Signed)
This RN offered to change pt's brief and get him cleaned up, pt. Refused stating that his wife was coming and would bring him briefs and he wanted to wait.

## 2022-10-03 NOTE — ED Notes (Signed)
Pt addiment that he wants to walk to bathroom. Pt educated that he is too weak to walk. Pt stood at bedside with this RN using urinal. Pt refusing to change out of wet clothing.

## 2022-10-03 NOTE — Progress Notes (Signed)
[name]  ROUNDING NOTE   Subjective:   John [name] is a 76 year old male with past medical conditions including COPD, hyperlipidemia, hypertension, CHF, and chronic kidney disease stage IV.  Patient presents to the emergency room at the advice of nephrologist for evaluation of progressive lower extremity edema.  Patient has been admitted for Acute on chronic diastolic CHF (congestive heart failure) (HCC) [I50.33] Acute on chronic diastolic congestive heart failure ([place]) [I50.33]  Patient is known to our practice and is followed by Dr. Zollie Scale outpatient.  Last seen in office yesterday with Dr. Holley Raring to evaluate increased weight gain and lower extremity edema.  It is noted that patient received IV Lasix, 6 doses, as same-day surgery without relief of symptoms.  Patient seen in ED, resting on stretcher.  Family at bedside.  States he has continued to take all medications including torsemide 100 mg daily.  Patient states he was instructed to discontinue metolazone "a while ago".  Has remained on room air baseline, currently on 2 L nasal cannula..  Complains of increased fatigue.  Denies chest or abdominal pain.  Denies nausea, vomiting, or diarrhea.  Denies recent NSAID use.  Labs on ED arrival include sodium 141, potassium 5.3, serum bicarb 34, BUN 42, creatinine 2.41 with GFR 27, albumin 3.1, BNP greater than 1100, troponin 32.  Chest x-ray negative for acute changes.  We have been consulted to manage fluid volume overload.  Patient was seen today in ER Patient feels better than yesterday    Objective:  Vital signs in last 24 hours:  Temp:  [98.2 F (36.8 C)-98.3 F (36.8 C)] 98.2 F (36.8 C) (10/18 1506) Pulse Rate:  [69-98] 86 (10/19 0700) Resp:  [16-27] 27 (10/19 0700) BP: (94-126)/(65-88) 100/68 (10/19 0700) SpO2:  [80 %-100 %] 100 % (10/19 0700) Weight:  [100.7 kg] 100.7 kg (10/18 0823)  Weight change:  Filed Weights   10/02/22 0815 10/02/22 0823  Weight:  100.7 kg 100.7 kg    Intake/Output: I/O last 3 completed shifts: In: -  Out: 2600 [Urine:2600]   Intake/Output this shift:  No intake/output data recorded.  Physical Exam: General: NAD, resting comfortably  Head: Normocephalic, atraumatic. Moist oral mucosal membranes  Eyes: Anicteric  Lungs:  Diminished in bases, normal effort, [place] O2  Heart: Regular rate and rhythm  Abdomen:  Soft, nontender  Extremities: 2+ Non-pitting peripheral edema.  Neurologic: Nonfocal, moving all four extremities  Skin: No lesions  Access: None    Basic Metabolic Panel: Recent Labs  Lab 10/02/22 0858 10/03/22 0010  NA 141 138  K 5.3* 5.7*  CL 103 97*  CO2 34* 31  GLUCOSE 108* 99  BUN 42* 37*  CREATININE 2.41* 2.25*  CALCIUM 8.8* 8.9  MG  --  2.6*    Liver Function Tests: Recent Labs  Lab 10/02/22 0858  AST 27  ALT 17  ALKPHOS 64  BILITOT 1.5*  PROT 6.1*  ALBUMIN 3.1*   No results for input(s): "LIPASE", "AMYLASE" in the last 168 hours. No results for input(s): "AMMONIA" in the last 168 hours.  CBC: Recent Labs  Lab 10/02/22 0858  WBC 8.1  NEUTROABS 6.1  HGB 16.7  HCT 54.2*  MCV 104.4*  PLT 117*    Cardiac Enzymes: No results for input(s): "CKTOTAL", "CKMB", "CKMBINDEX", "TROPONINI" in the last 168 hours.  BNP: Invalid input(s): "POCBNP"  CBG: No results for input(s): "GLUCAP" in the last 168 hours.  Microbiology: Results for orders placed or performed during the hospital encounter of  01/28/22  Resp Panel by RT-PCR (Flu A&B, Covid) Nasopharyngeal Swab     Status: None   Collection Time: 01/28/22  7:08 PM   Specimen: Nasopharyngeal Swab; Nasopharyngeal(NP) swabs in vial transport medium  Result Value Ref Range Status   SARS Coronavirus 2 by RT PCR NEGATIVE NEGATIVE Final    Comment: (NOTE) SARS-CoV-2 target nucleic acids are NOT DETECTED.  The SARS-CoV-2 RNA is generally detectable in upper respiratory specimens during the acute phase of infection. The  lowest concentration of SARS-CoV-2 viral copies this assay can detect is 138 copies/mL. A negative result does not preclude SARS-Cov-2 infection and should not be used as the sole basis for treatment or other patient management decisions. A negative result may occur with  improper specimen collection/handling, submission of specimen other than nasopharyngeal swab, presence of viral mutation(s) within the areas targeted by this assay, and inadequate number of viral copies(<138 copies/mL). A negative result must be combined with clinical observations, patient history, and epidemiological information. The expected result is Negative.  Fact Sheet for Patients:  EntrepreneurPulse.com.au  Fact Sheet for Healthcare Providers:  IncredibleEmployment.be  This test is no t yet approved or cleared by the [place] FDA and  has been authorized for detection and/or diagnosis of SARS-CoV-2 by FDA under an Emergency Use Authorization (EUA). This EUA will remain  in effect (meaning this test can be used) for the duration of the COVID-19 declaration under Section 564(b)(1) of the Act, 21 U.S.C.section 360bbb-3(b)(1), unless the authorization is terminated  or revoked sooner.       Influenza A by PCR NEGATIVE NEGATIVE Final   Influenza B by PCR NEGATIVE NEGATIVE Final    Comment: (NOTE) The Xpert Xpress SARS-CoV-2/FLU/RSV plus assay is intended as an aid in the diagnosis of influenza from Nasopharyngeal swab specimens and should not be used as a sole basis for treatment. Nasal washings and aspirates are unacceptable for Xpert Xpress SARS-CoV-2/FLU/RSV testing.  Fact Sheet for Patients: EntrepreneurPulse.com.au  Fact Sheet for Healthcare Providers: IncredibleEmployment.be  This test is not yet approved or cleared by the [place] FDA and has been authorized for detection and/or diagnosis of SARS-CoV-2 by FDA under  an Emergency Use Authorization (EUA). This EUA will remain in effect (meaning this test can be used) for the duration of the COVID-19 declaration under Section 564(b)(1) of the Act, 21 U.S.C. section 360bbb-3(b)(1), unless the authorization is terminated or revoked.  Performed at [facility], [address] [place], [place] [address]   MRSA Next Gen by PCR, Nasal     Status: None   Collection Time: 01/28/22  8:47 PM   Specimen: Nasal Mucosa; Nasal Swab  Result Value Ref Range Status   MRSA by PCR Next Gen NOT DETECTED NOT DETECTED Final    Comment: (NOTE) The GeneXpert MRSA Assay (FDA approved for NASAL specimens only), is one component of a comprehensive MRSA colonization surveillance program. It is not intended to diagnose MRSA infection nor to guide or monitor treatment for MRSA infections. Test performance is not FDA approved in patients less than 72 years old. Performed at [facility], [address] [place], [place] [address]     Coagulation Studies: No results for input(s): "LABPROT", "INR" in the last 72 hours.  Urinalysis: No results for input(s): "COLORURINE", "LABSPEC", "PHURINE", "GLUCOSEU", "HGBUR", "BILIRUBINUR", "KETONESUR", "PROTEINUR", "UROBILINOGEN", "NITRITE", "LEUKOCYTESUR" in the last 72 hours.  Invalid input(s): "APPERANCEUR"    Imaging: ECHOCARDIOGRAM COMPLETE  Result Date: 10/02/2022    ECHOCARDIOGRAM REPORT  Patient Name:   John [name] Date of Exam: 10/02/2022 Medical Rec #:  [id number]      Height:       70.0 in Accession #:    [id number]     Weight:       222.0 lb Date of Birth:  [birth year]     BSA:          2.182 m Patient Age:    68 years       BP:           117/76 mmHg Patient Gender: M              HR:           85 bpm. Exam Location:  [facility] Procedure: 2D Echo, Cardiac Doppler and Color Doppler Indications:     CHF- acute diastolic V25.36  History:         Patient has prior history of Echocardiogram examinations, most                   recent 01/29/2022. CHF, COPD; Risk Factors:Hypertension.                  CKD---stage 4, pneumonia.  Sonographer:     Sherrie Sport Referring Phys:  [id number] Ivor Costa Diagnosing Phys: Yolonda Kida MD  Sonographer Comments: Suboptimal apical window. IMPRESSIONS  1. Left ventricular ejection fraction, by estimation, is 50 to 55%. The left ventricle has low normal function. The left ventricle has no regional wall motion abnormalities. There is mild left ventricular hypertrophy. Left ventricular diastolic parameters are consistent with Grade I diastolic dysfunction (impaired relaxation). There is the interventricular septum is flattened in diastole ('D' shaped left ventricle), consistent with right ventricular volume overload.  2. Right ventricular systolic function is moderately reduced. The right ventricular size is moderately enlarged.  3. Left atrial size was mildly dilated.  4. Right atrial size was moderately dilated.  5. The mitral valve is normal in structure. No evidence of mitral valve regurgitation.  6. Tricuspid valve regurgitation is mild to moderate.  7. The aortic valve is normal in structure. Aortic valve regurgitation is not visualized. FINDINGS  Left Ventricle: Left ventricular ejection fraction, by estimation, is 50 to 55%. The left ventricle has low normal function. The left ventricle has no regional wall motion abnormalities. The left ventricular internal cavity size was normal in size. There is mild left ventricular hypertrophy. The interventricular septum is flattened in diastole ('D' shaped left ventricle), consistent with right ventricular volume overload. Left ventricular diastolic parameters are consistent with Grade I diastolic dysfunction (impaired relaxation). Right Ventricle: The right ventricular size is moderately enlarged. No increase in right ventricular wall thickness. Right ventricular systolic function is moderately reduced. Left Atrium: Left atrial size was mildly  dilated. Right Atrium: Right atrial size was moderately dilated. Pericardium: There is no evidence of pericardial effusion. Mitral Valve: The mitral valve is normal in structure. No evidence of mitral valve regurgitation. Tricuspid Valve: The tricuspid valve is normal in structure. Tricuspid valve regurgitation is mild to moderate. Aortic Valve: The aortic valve is normal in structure. Aortic valve regurgitation is not visualized. Aortic valve mean gradient measures 2.0 mmHg. Aortic valve peak gradient measures 4.5 mmHg. Aortic valve area, by VTI measures 2.76 cm. Pulmonic Valve: The pulmonic valve was normal in structure. Pulmonic valve regurgitation is not visualized. Aorta: The ascending aorta was not well visualized. IAS/Shunts: No atrial level shunt detected by color flow Doppler.  LEFT VENTRICLE PLAX  2D LVIDd:         3.40 cm   Diastology LVIDs:         2.50 cm   LV e' medial:    4.35 cm/s LV PW:         1.30 cm   LV E/e' medial:  11.1 LV IVS:        1.30 cm   LV e' lateral:   8.59 cm/s LVOT diam:     2.10 cm   LV E/e' lateral: 5.6 LV SV:         55 LV SV Index:   25 LVOT Area:     3.46 cm  RIGHT VENTRICLE RV Basal diam:  5.10 cm RV Mid diam:    4.80 cm RV S prime:     11.40 cm/s TAPSE (M-mode): 2.4 cm LEFT ATRIUM         Index       RIGHT ATRIUM           Index LA diam:    2.90 cm 1.33 cm/m  RA Area:     30.70 cm                                 RA Volume:   125.00 ml 57.29 ml/m  AORTIC VALVE                    PULMONIC VALVE AV Area (Vmax):    2.67 cm     PR End Diast Vel: 8.53 msec AV Area (Vmean):   2.55 cm AV Area (VTI):     2.76 cm AV Vmax:           106.00 cm/s AV Vmean:          70.600 cm/s AV VTI:            0.201 m AV Peak Grad:      4.5 mmHg AV Mean Grad:      2.0 mmHg LVOT Vmax:         81.80 cm/s LVOT Vmean:        51.900 cm/s LVOT VTI:          0.160 m LVOT/AV VTI ratio: 0.80  AORTA Ao Root diam: 4.42 cm MITRAL VALVE               TRICUSPID VALVE MV Area (PHT): 3.27 cm    TR Peak grad:    24.0 mmHg MV Decel Time: 232 msec    TR Vmax:        245.00 cm/s MV E velocity: 48.20 cm/s MV A velocity: 67.60 cm/s  SHUNTS MV E/A ratio:  0.71        Systemic VTI:  0.16 m                            Systemic Diam: 2.10 cm Yolonda Kida MD Electronically signed by Yolonda Kida MD Signature Date/Time: 10/02/2022/4:23:07 PM    Final    DG Chest Port 1 View  Result Date: 10/02/2022 CLINICAL DATA:  Several week history of generalized swelling and shortness of breath EXAM: PORTABLE CHEST 1 VIEW COMPARISON:  Chest radiograph dated 01/28/2022 FINDINGS: Increased lung volumes with flattening of the hemidiaphragms. No focal consolidations. No pleural effusion or pneumothorax. Similar enlarged cardiomediastinal silhouette. Aortic atherosclerosis. The visualized skeletal structures are unremarkable. IMPRESSION: 1. No focal  consolidation. 2. Increased lung volumes with flattening of the hemidiaphragms, which can be seen in the setting of COPD. 3. Similar cardiomegaly. 4.  Aortic Atherosclerosis (ICD10-I70.0). Electronically Signed   By: Darrin Nipper M.D.   On: 10/02/2022 08:46     Medications:    furosemide (LASIX) 200 mg in dextrose 5 % 100 mL (2 mg/mL) infusion 8 mg/hr (10/03/22 0750)    aspirin EC  81 mg Oral Daily   enoxaparin (LOVENOX) injection  30 mg Subcutaneous Q24H   losartan  50 mg Oral Daily     Assessment/ Plan:  [name] is a 76 y.o.  male with past medical conditions including COPD, hyperlipidemia, hypertension, CHF, and chronic kidney disease stage IV.  Patient presents to the emergency room at the advice of nephrologist for evaluation of progressive lower extremity edema.  Patient has been admitted for Acute on chronic diastolic CHF (congestive heart failure) (HCC) [I50.33] Acute on chronic diastolic congestive heart failure (HCC) [I50.33]   Chronic kidney disease stage IV with patient at baseline renal function.  Chronic kidney disease is secondary to  cardiorenal Outpatient labs show fluctuating renal function since early this year. Will place patient on Furosemide drip 8/mg/hr and monitor.   Lab Results  Component Value Date   CREATININE 2.25 (H) 10/03/2022   CREATININE 2.41 (H) 10/02/2022   CREATININE 2.32 (H) 09/06/2022    Intake/Output Summary (Last 24 hours) at 10/03/2022 0751 Last data filed at 10/03/2022 0500 Gross per 24 hour  Intake --  Output 2600 ml  Net -2600 ml    2.  Acute on chronic Diastolic heart failure, ECHO from 01/29/22 shows EF 55-60% with severely enlarged right ventricular and moderately elevated pulmonary pressure.  Patient is being admitted with fluid overload Patient is negative by 2.6 L We will continue patient on Lasix drip  3. Hyperkalemia Secondary to CKD/Dietary indiscretion Patient potassium has worsened We will start patient on Lokelma  4. Secondary hyperparathyroidism Patient has history of secondary hyperparathyroidism Patient intact PTH was elevated 162 as an outpatient Patient is on calcitriol as an outpatient We will follow   Plan We will start patient on Lokelma We will continue patient on Lasix drip   LOS: 1 [place] Doss s Pietrina Jagodzinski 10/19/20237:51 AM

## 2022-10-03 NOTE — ED Notes (Signed)
This RN to bedside, pt. Sleeping. When pt. Fully awake, he is alert and oriented. Pt. Once again refuses to have brief changed. This RN educates pt. Again about importance of elevating legs. Pt. Is reluctant to follow advice of RN.

## 2022-10-03 NOTE — ED Notes (Signed)
Pt. Provided glass of ice water per request.

## 2022-10-04 DIAGNOSIS — I5033 Acute on chronic diastolic (congestive) heart failure: Secondary | ICD-10-CM | POA: Diagnosis not present

## 2022-10-04 LAB — BASIC METABOLIC PANEL
Anion gap: 7 (ref 5–15)
BUN: 25 mg/dL — ABNORMAL HIGH (ref 8–23)
CO2: 36 mmol/L — ABNORMAL HIGH (ref 22–32)
Calcium: 8.5 mg/dL — ABNORMAL LOW (ref 8.9–10.3)
Chloride: 99 mmol/L (ref 98–111)
Creatinine, Ser: 1.78 mg/dL — ABNORMAL HIGH (ref 0.61–1.24)
GFR, Estimated: 39 mL/min — ABNORMAL LOW (ref >60)
Glucose, Bld: 101 mg/dL — ABNORMAL HIGH (ref 70–99)
Potassium: 4.8 mmol/L (ref 3.5–5.1)
Sodium: 142 mmol/L (ref 135–145)

## 2022-10-04 LAB — MAGNESIUM: Magnesium: 2.2 mg/dL (ref 1.7–2.4)

## 2022-10-04 LAB — VITAMIN B12: Vitamin B-12: 678 pg/mL (ref 180–914)

## 2022-10-04 NOTE — Plan of Care (Signed)
  Problem: Education: Goal: Ability to demonstrate management of disease process will improve Outcome: Progressing Goal: Individualized Educational Video(s) Outcome: Progressing   Problem: Activity: Goal: Capacity to carry out activities will improve Outcome: Progressing Pt up to recliner with standby assist.   Problem: Cardiac: Goal: Ability to achieve and maintain adequate cardiopulmonary perfusion will improve Outcome: Progressing   Problem: Education: Goal: Knowledge of General Education information will improve Description: Including pain rating scale, medication(s)/side effects and non-pharmacologic comfort measures Outcome: Progressing

## 2022-10-04 NOTE — Consult Note (Signed)
   Heart Failure Nurse Navigator Note  Attempted x3 to see patient today, each time he was asleep.  Will continue to try to see patient/  Pricilla Riffle RN [facility]

## 2022-10-04 NOTE — Care Management Important Message (Signed)
Important Message  Patient Details  Name: John [name] MRN: [id number] Date of Birth: 1946-11-02   Medicare Important Message Given:  Yes     Juliann Pulse A Tishana Clinkenbeard 10/04/2022, 2:32 PM

## 2022-10-04 NOTE — Progress Notes (Signed)
Patient admitted to the unit and placed on telemetry. A skin assessment was completed by myself and Journalist, newspaper. The patient has no skin issues noted at this time.

## 2022-10-04 NOTE — Progress Notes (Signed)
PT Cancellation Note  Patient Details Name: John [name] MRN: [id number] DOB: 12/10/1946   Cancelled Treatment:    Reason Eval/Treat Not Completed: Other (comment). Pt sleeping upon entrance, wakes to touch. When preparing to initiate mobility (clearing room space, untangling lines/leads, preparing O2 tank) pt became frustrated with PT questions. Educated on PT role, expectations and mobility as able, but pt removed [place] and refused to don it due to nose pain and stated he has "been getting along without it" and does not want it now. SpO2 readings ranging from 80-84% on room air. RN notified of pt status and his requests to remove telemetry and his [place] removal and in room to assess pt upon PT exit. PT to re-attempt as able.   Lieutenant Diego PT, DPT 2:16 PM,10/04/22

## 2022-10-04 NOTE — Progress Notes (Signed)
PROGRESS NOTE    John [name]  BMW:[id number] DOB: 07/13/1946 DOA: 10/02/2022 PCP: Tracie Harrier, MD   Brief Narrative:  76 year old with history of diastolic CHF, CKD stage IV, HTN, HLD, COPD, erythrocytosis, cor pulmonale admitted for lower extremity edema and weight gain.  Minimal shortness of breath but requiring 2 L nasal cannula in the ED due to hypoxia.  Patient was seen by his nephrologist Dr. Zollie Scale outpatient and was advised to come to the hospital for IV diuretics.  Chest x-ray showed cardiomegaly, nephrology team was consulted.  Creatinine slowly improving with IV diuretics   Assessment & Plan:  Principal Problem:   Acute on chronic diastolic CHF (congestive heart failure) (HCC) Active Problems:   Myocardial injury   COPD (chronic obstructive pulmonary disease) (HCC)   Hypertension   Hyperkalemia   Erythrocytosis   Thrombocytopenia (HCC)     Assessment and Plan: * Acute on chronic diastolic CHF (congestive heart failure) ([place]) 2D echo on 01/29/2022 showed EF 55 to 60%.  Patient has leg edema, positive JVD, crackles on auscultation, elevated BNP 1103.  Patient denies shortness breath, but he has 2 L new oxygen requirement.  Nephrology recommended Lasix drip which he is tolerating well.  Closely monitor electrolytes, elevate legs whenever possible. Echocardiogram-EF 50% with right ventricular volume overload with LA dilation, moderate TR.  CKD stage IV - Admission creatinine 2.4, improving with diuretics.  This morning 1.78   Myocardial injury Troponins remain flat at this time.  Suspect mild demand ischemia -Continue aspirin. LDL 74, A1c 6.3  COPD (chronic obstructive pulmonary disease) (HCC) Stable.  -As needed albuterol and Mucinex  Hypertension - IV hydralazine as needed -Continue Cozaar  Hyperkalemia Potassium peaked at 5.7.  Resolved with Lokelma  Erythrocytosis Hemoglobin 16.7 -Follow-up with CBC  Thrombocytopenia (HCC) Chronic issue, no  obvious evidence of infection.    Difficult to get blood from him, nephrology does not want PICC line.  Therefore have advised nursing staff to use IV line to obtain blood work whenever possible with caution as we are diuresing the patient and need close monitoring   DVT prophylaxis: Lovenox Code Status: Full code Family Communication:    Status is: Inpatient Still significantly volume overloaded on IV Lasix drip  Subjective: Feels better compared to yesterday, urinating okay.  Examination: Constitutional: Not in acute distress Respiratory: Diminished breath sounds bilaterally especially at the bases Cardiovascular: Normal sinus rhythm, no rubs Abdomen: Nontender nondistended good bowel sounds Musculoskeletal: 3+ bilateral lower extremity pitting edema Skin: No rashes seen Neurologic: CN 2-12 grossly intact.  And nonfocal Psychiatric: Normal judgment and insight. Alert and oriented x 3. Normal mood.  Objective: Vitals:   10/04/22 0349 10/04/22 0420 10/04/22 0744 10/04/22 1108  BP: 120/82  104/69 111/76  Pulse: 85  86 90  Resp: '17  18 19  '$ Temp: 99.1 F (37.3 C)  98.1 F (36.7 C) 98.4 F (36.9 C)  TempSrc:   Oral Oral  SpO2: 97%  94% 95%  Weight:  99.7 kg    Height:        Intake/Output Summary (Last 24 hours) at 10/04/2022 1118 Last data filed at 10/04/2022 1106 Gross per 24 hour  Intake 608.11 ml  Output 1850 ml  Net -1241.89 ml   Filed Weights   10/02/22 0823 10/03/22 2131 10/04/22 0420  Weight: 100.7 kg 101 kg 99.7 kg     Data Reviewed:   CBC: Recent Labs  Lab 10/02/22 0858 10/03/22 1315  WBC 8.1 8.3  NEUTROABS 6.1  --  HGB 16.7 16.9  HCT 54.2* 54.9*  MCV 104.4* 106.0*  PLT 117* 98*   Basic Metabolic Panel: Recent Labs  Lab 10/02/22 0858 10/03/22 0010 10/04/22 0851  NA 141 138 142  K 5.3* 5.7* 4.8  CL 103 97* 99  CO2 34* 31 36*  GLUCOSE 108* 99 101*  BUN 42* 37* 25*  CREATININE 2.41* 2.25* 1.78*  CALCIUM 8.8* 8.9 8.5*  MG  --  2.6*  2.2   GFR: Estimated Creatinine Clearance: 41.8 mL/min (A) (by C-G formula based on SCr of 1.78 mg/dL (H)). Liver Function Tests: Recent Labs  Lab 10/02/22 0858  AST 27  ALT 17  ALKPHOS 64  BILITOT 1.5*  PROT 6.1*  ALBUMIN 3.1*   No results for input(s): "LIPASE", "AMYLASE" in the last 168 hours. No results for input(s): "AMMONIA" in the last 168 hours. Coagulation Profile: No results for input(s): "INR", "PROTIME" in the last 168 hours. Cardiac Enzymes: No results for input(s): "CKTOTAL", "CKMB", "CKMBINDEX", "TROPONINI" in the last 168 hours. BNP (last 3 results) No results for input(s): "PROBNP" in the last 8760 hours. HbA1C: Recent Labs    10/02/22 2316  HGBA1C 6.3*   CBG: No results for input(s): "GLUCAP" in the last 168 hours. Lipid Profile: Recent Labs    10/03/22 0010  CHOL 141  HDL 40*  LDLCALC 86  TRIG 74  CHOLHDL 3.5   Thyroid Function Tests: No results for input(s): "TSH", "T4TOTAL", "FREET4", "T3FREE", "THYROIDAB" in the last 72 hours. Anemia Panel: No results for input(s): "VITAMINB12", "FOLATE", "FERRITIN", "TIBC", "IRON", "RETICCTPCT" in the last 72 hours. Sepsis Labs: No results for input(s): "PROCALCITON", "LATICACIDVEN" in the last 168 hours.  No results found for this or any previous visit (from the past 240 hour(s)).       Radiology Studies: ECHOCARDIOGRAM COMPLETE  Result Date: 10/02/2022    ECHOCARDIOGRAM REPORT   Patient Name:   John [name] Date of Exam: 10/02/2022 Medical Rec #:  [id number]      Height:       70.0 in Accession #:    [id number]     Weight:       222.0 lb Date of Birth:  26-Dec-1945     BSA:          2.182 m Patient Age:    40 years       BP:           117/76 mmHg Patient Gender: M              HR:           85 bpm. Exam Location:  [facility] Procedure: 2D Echo, Cardiac Doppler and Color Doppler Indications:     CHF- acute diastolic Y30.16  History:         Patient has prior history of Echocardiogram examinations, most                   recent 01/29/2022. CHF, COPD; Risk Factors:Hypertension.                  CKD---stage 4, pneumonia.  Sonographer:     Sherrie Sport Referring Phys:  [id number] Ivor Costa Diagnosing Phys: Yolonda Kida MD  Sonographer Comments: Suboptimal apical window. IMPRESSIONS  1. Left ventricular ejection fraction, by estimation, is 50 to 55%. The left ventricle has low normal function. The left ventricle has no regional wall motion abnormalities. There is mild left ventricular hypertrophy. Left ventricular diastolic parameters are consistent with Grade I diastolic  dysfunction (impaired relaxation). There is the interventricular septum is flattened in diastole ('D' shaped left ventricle), consistent with right ventricular volume overload.  2. Right ventricular systolic function is moderately reduced. The right ventricular size is moderately enlarged.  3. Left atrial size was mildly dilated.  4. Right atrial size was moderately dilated.  5. The mitral valve is normal in structure. No evidence of mitral valve regurgitation.  6. Tricuspid valve regurgitation is mild to moderate.  7. The aortic valve is normal in structure. Aortic valve regurgitation is not visualized. FINDINGS  Left Ventricle: Left ventricular ejection fraction, by estimation, is 50 to 55%. The left ventricle has low normal function. The left ventricle has no regional wall motion abnormalities. The left ventricular internal cavity size was normal in size. There is mild left ventricular hypertrophy. The interventricular septum is flattened in diastole ('D' shaped left ventricle), consistent with right ventricular volume overload. Left ventricular diastolic parameters are consistent with Grade I diastolic dysfunction (impaired relaxation). Right Ventricle: The right ventricular size is moderately enlarged. No increase in right ventricular wall thickness. Right ventricular systolic function is moderately reduced. Left Atrium: Left atrial size was mildly  dilated. Right Atrium: Right atrial size was moderately dilated. Pericardium: There is no evidence of pericardial effusion. Mitral Valve: The mitral valve is normal in structure. No evidence of mitral valve regurgitation. Tricuspid Valve: The tricuspid valve is normal in structure. Tricuspid valve regurgitation is mild to moderate. Aortic Valve: The aortic valve is normal in structure. Aortic valve regurgitation is not visualized. Aortic valve mean gradient measures 2.0 mmHg. Aortic valve peak gradient measures 4.5 mmHg. Aortic valve area, by VTI measures 2.76 cm. Pulmonic Valve: The pulmonic valve was normal in structure. Pulmonic valve regurgitation is not visualized. Aorta: The ascending aorta was not well visualized. IAS/Shunts: No atrial level shunt detected by color flow Doppler.  LEFT VENTRICLE PLAX 2D LVIDd:         3.40 cm   Diastology LVIDs:         2.50 cm   LV e' medial:    4.35 cm/s LV PW:         1.30 cm   LV E/e' medial:  11.1 LV IVS:        1.30 cm   LV e' lateral:   8.59 cm/s LVOT diam:     2.10 cm   LV E/e' lateral: 5.6 LV SV:         55 LV SV Index:   25 LVOT Area:     3.46 cm  RIGHT VENTRICLE RV Basal diam:  5.10 cm RV Mid diam:    4.80 cm RV S prime:     11.40 cm/s TAPSE (M-mode): 2.4 cm LEFT ATRIUM         Index       RIGHT ATRIUM           Index LA diam:    2.90 cm 1.33 cm/m  RA Area:     30.70 cm                                 RA Volume:   125.00 ml 57.29 ml/m  AORTIC VALVE                    PULMONIC VALVE AV Area (Vmax):    2.67 cm     PR End Diast Vel: 8.53 msec AV Area (Vmean):  2.55 cm AV Area (VTI):     2.76 cm AV Vmax:           106.00 cm/s AV Vmean:          70.600 cm/s AV VTI:            0.201 m AV Peak Grad:      4.5 mmHg AV Mean Grad:      2.0 mmHg LVOT Vmax:         81.80 cm/s LVOT Vmean:        51.900 cm/s LVOT VTI:          0.160 m LVOT/AV VTI ratio: 0.80  AORTA Ao Root diam: 4.42 cm MITRAL VALVE               TRICUSPID VALVE MV Area (PHT): 3.27 cm    TR Peak grad:    24.0 mmHg MV Decel Time: 232 msec    TR Vmax:        245.00 cm/s MV E velocity: 48.20 cm/s MV A velocity: 67.60 cm/s  SHUNTS MV E/A ratio:  0.71        Systemic VTI:  0.16 m                            Systemic Diam: 2.10 cm Dwayne D Callwood MD Electronically signed by Yolonda Kida MD Signature Date/Time: 10/02/2022/4:23:07 PM    Final         Scheduled Meds:  aspirin EC  81 mg Oral Daily   enoxaparin (LOVENOX) injection  40 mg Subcutaneous Q24H   losartan  50 mg Oral Daily   sodium zirconium cyclosilicate  10 g Oral Daily   Continuous Infusions:  furosemide (LASIX) 200 mg in dextrose 5 % 100 mL (2 mg/mL) infusion 8 mg/hr (10/04/22 0631)     LOS: 2 days   Time spent= 35 mins    Arlean Thies Arsenio Loader, MD [facility]  If 7PM-7AM, please contact night-coverage  10/04/2022, 11:18 AM

## 2022-10-04 NOTE — Progress Notes (Addendum)
[name]  ROUNDING NOTE   Subjective:   John [name] is a 76 year old male with past medical conditions including COPD, hyperlipidemia, hypertension, CHF, and chronic kidney disease stage IV.  Patient presents to the emergency room at the advice of nephrologist for evaluation of progressive lower extremity edema.  Patient has been admitted for Acute on chronic diastolic congestive heart failure (HCC) [I50.33] Acute on chronic systolic congestive heart failure (HCC) [I50.23] Acute respiratory failure with hypoxia (HCC) [J96.01] Acute on chronic diastolic CHF (congestive heart failure) (HCC) [I50.33] Stage 4 chronic kidney disease ([place]) [N18.4]  Patient is known to our practice and is followed by Dr. Zollie Scale outpatient.  Last seen in office yesterday with Dr. Holley Raring to evaluate increased weight gain and lower extremity edema.    Patient sitting up in bed Flat affect States he doesn't notice any improvement with edema Adequate oral intake noted. Breakfast tray at bedside Denies nausea and vomiting Room air  Objective:  Vital signs in last 24 hours:  Temp:  [97.8 F (36.6 C)-99.1 F (37.3 C)] 98.4 F (36.9 C) (10/20 1108) Pulse Rate:  [65-90] 90 (10/20 1108) Resp:  [13-23] 19 (10/20 1108) BP: (91-123)/(63-82) 111/76 (10/20 1108) SpO2:  [81 %-99 %] 95 % (10/20 1108) Weight:  [99.7 kg-101 kg] 99.7 kg (10/20 0420)  Weight change: 0.318 kg Filed Weights   10/02/22 0823 10/03/22 2131 10/04/22 0420  Weight: 100.7 kg 101 kg 99.7 kg    Intake/Output: I/O last 3 completed shifts: In: 128.1 [I.V.:128.1] Out: 3225 [Urine:3225]   Intake/Output this shift:  Total I/O In: 480 [P.O.:480] Out: 900 [Urine:900]  Physical Exam: General: NAD, resting comfortably  Head: Normocephalic, atraumatic. Moist oral mucosal membranes  Eyes: Anicteric  Lungs:  Diminished in bases, normal effort, [place] O2  Heart: Regular rate and rhythm  Abdomen:  Soft, nontender  Extremities: 2+   peripheral edema.  Neurologic: Nonfocal, moving all four extremities  Skin: No lesions  Access: None    Basic Metabolic Panel: Recent Labs  Lab 10/02/22 0858 10/03/22 0010 10/04/22 0851  NA 141 138 142  K 5.3* 5.7* 4.8  CL 103 97* 99  CO2 34* 31 36*  GLUCOSE 108* 99 101*  BUN 42* 37* 25*  CREATININE 2.41* 2.25* 1.78*  CALCIUM 8.8* 8.9 8.5*  MG  --  2.6* 2.2     Liver Function Tests: Recent Labs  Lab 10/02/22 0858  AST 27  ALT 17  ALKPHOS 64  BILITOT 1.5*  PROT 6.1*  ALBUMIN 3.1*    No results for input(s): "LIPASE", "AMYLASE" in the last 168 hours. No results for input(s): "AMMONIA" in the last 168 hours.  CBC: Recent Labs  Lab 10/02/22 0858 10/03/22 1315  WBC 8.1 8.3  NEUTROABS 6.1  --   HGB 16.7 16.9  HCT 54.2* 54.9*  MCV 104.4* 106.0*  PLT 117* 98*     Cardiac Enzymes: No results for input(s): "CKTOTAL", "CKMB", "CKMBINDEX", "TROPONINI" in the last 168 hours.  BNP: Invalid input(s): "POCBNP"  CBG: No results for input(s): "GLUCAP" in the last 168 hours.  Microbiology: Results for orders placed or performed during the hospital encounter of 01/28/22  Resp Panel by RT-PCR (Flu A&B, Covid) Nasopharyngeal Swab     Status: None   Collection Time: 01/28/22  7:08 PM   Specimen: Nasopharyngeal Swab; Nasopharyngeal(NP) swabs in vial transport medium  Result Value Ref Range Status   SARS Coronavirus 2 by RT PCR NEGATIVE NEGATIVE Final    Comment: (NOTE) SARS-CoV-2 target nucleic  acids are NOT DETECTED.  The SARS-CoV-2 RNA is generally detectable in upper respiratory specimens during the acute phase of infection. The lowest concentration of SARS-CoV-2 viral copies this assay can detect is 138 copies/mL. A negative result does not preclude SARS-Cov-2 infection and should not be used as the sole basis for treatment or other patient management decisions. A negative result may occur with  improper specimen collection/handling, submission of specimen  other than nasopharyngeal swab, presence of viral mutation(s) within the areas targeted by this assay, and inadequate number of viral copies(<138 copies/mL). A negative result must be combined with clinical observations, patient history, and epidemiological information. The expected result is Negative.  Fact Sheet for Patients:  EntrepreneurPulse.com.au  Fact Sheet for Healthcare Providers:  IncredibleEmployment.be  This test is no t yet approved or cleared by the [place] FDA and  has been authorized for detection and/or diagnosis of SARS-CoV-2 by FDA under an Emergency Use Authorization (EUA). This EUA will remain  in effect (meaning this test can be used) for the duration of the COVID-19 declaration under Section 564(b)(1) of the Act, 21 U.S.C.section 360bbb-3(b)(1), unless the authorization is terminated  or revoked sooner.       Influenza A by PCR NEGATIVE NEGATIVE Final   Influenza B by PCR NEGATIVE NEGATIVE Final    Comment: (NOTE) The Xpert Xpress SARS-CoV-2/FLU/RSV plus assay is intended as an aid in the diagnosis of influenza from Nasopharyngeal swab specimens and should not be used as a sole basis for treatment. Nasal washings and aspirates are unacceptable for Xpert Xpress SARS-CoV-2/FLU/RSV testing.  Fact Sheet for Patients: EntrepreneurPulse.com.au  Fact Sheet for Healthcare Providers: IncredibleEmployment.be  This test is not yet approved or cleared by the [place] FDA and has been authorized for detection and/or diagnosis of SARS-CoV-2 by FDA under an Emergency Use Authorization (EUA). This EUA will remain in effect (meaning this test can be used) for the duration of the COVID-19 declaration under Section 564(b)(1) of the Act, 21 U.S.C. section 360bbb-3(b)(1), unless the authorization is terminated or revoked.  Performed at [facility], [address]  [place], [place] [address]   MRSA Next Gen by PCR, Nasal     Status: None   Collection Time: 01/28/22  8:47 PM   Specimen: Nasal Mucosa; Nasal Swab  Result Value Ref Range Status   MRSA by PCR Next Gen NOT DETECTED NOT DETECTED Final    Comment: (NOTE) The GeneXpert MRSA Assay (FDA approved for NASAL specimens only), is one component of a comprehensive MRSA colonization surveillance program. It is not intended to diagnose MRSA infection nor to guide or monitor treatment for MRSA infections. Test performance is not FDA approved in patients less than 87 years old. Performed at [facility], [address] [place], [place] [address]     Coagulation Studies: No results for input(s): "LABPROT", "INR" in the last 72 hours.  Urinalysis: No results for input(s): "COLORURINE", "LABSPEC", "PHURINE", "GLUCOSEU", "HGBUR", "BILIRUBINUR", "KETONESUR", "PROTEINUR", "UROBILINOGEN", "NITRITE", "LEUKOCYTESUR" in the last 72 hours.  Invalid input(s): "APPERANCEUR"    Imaging: No results found.   Medications:    furosemide (LASIX) 200 mg in dextrose 5 % 100 mL (2 mg/mL) infusion 8 mg/hr (10/04/22 0631)    aspirin EC  81 mg Oral Daily   enoxaparin (LOVENOX) injection  40 mg Subcutaneous Q24H   losartan  50 mg Oral Daily   sodium zirconium cyclosilicate  10 g Oral Daily     Assessment/ Plan:  John [name]  is a 76 y.o.  male with past medical conditions including COPD, hyperlipidemia, hypertension, CHF, and chronic kidney disease stage IV.  Patient presents to the emergency room at the advice of nephrologist for evaluation of progressive lower extremity edema.  Patient has been admitted for Acute on chronic diastolic congestive heart failure (HCC) [I50.33] Acute on chronic systolic congestive heart failure (HCC) [I50.23] Acute respiratory failure with hypoxia (HCC) [J96.01] Acute on chronic diastolic CHF (congestive heart failure) (HCC) [I50.33] Stage 4 chronic kidney disease  ([place]) [N18.4]   Chronic kidney disease stage IV with patient at baseline renal function.  Chronic kidney disease is secondary to cardiorenal Renal function remains at baseline  Lab Results  Component Value Date   CREATININE 1.78 (H) 10/04/2022   CREATININE 2.25 (H) 10/03/2022   CREATININE 2.41 (H) 10/02/2022    Intake/Output Summary (Last 24 hours) at 10/04/2022 1316 Last data filed at 10/04/2022 1106 Gross per 24 hour  Intake 608.11 ml  Output 1850 ml  Net -1241.89 ml     2.  Acute on chronic Diastolic heart failure, ECHO from 01/29/22 shows EF 55-60% with severely enlarged right ventricular and moderately elevated pulmonary pressure.  Patient is being admitted with fluid overload Remains on furosemide drip with UOP 1.225L recorded in previous 24 hours. Continue current treatments.  Patient is negative by approximately 4.5 L since admission  3. Hyperkalemia Secondary to CKD/Dietary indiscretion Potassium corrected with Lokelma, 4.8 today  4. Secondary hyperparathyroidism Patient has history of secondary hyperparathyroidism Patient intact PTH was elevated 162 as an outpatient Patient is on calcitriol as an outpatient Calcium within desired target. Will continue to monitor.     LOS: 2 [place] 10/20/20231:16 PM   I saw and evaluated the patient with Colon Flattery, NP.  I personally formulated the plan of care.  I agree with the findings and plan as documented in the note except as noted

## 2022-10-05 DIAGNOSIS — I5033 Acute on chronic diastolic (congestive) heart failure: Secondary | ICD-10-CM | POA: Diagnosis not present

## 2022-10-05 LAB — BASIC METABOLIC PANEL
Anion gap: 6 (ref 5–15)
BUN: 21 mg/dL (ref 8–23)
CO2: 38 mmol/L — ABNORMAL HIGH (ref 22–32)
Calcium: 8.4 mg/dL — ABNORMAL LOW (ref 8.9–10.3)
Chloride: 98 mmol/L (ref 98–111)
Creatinine, Ser: 1.72 mg/dL — ABNORMAL HIGH (ref 0.61–1.24)
GFR, Estimated: 41 mL/min — ABNORMAL LOW (ref >60)
Glucose, Bld: 107 mg/dL — ABNORMAL HIGH (ref 70–99)
Potassium: 3.6 mmol/L (ref 3.5–5.1)
Sodium: 142 mmol/L (ref 135–145)

## 2022-10-05 LAB — MAGNESIUM: Magnesium: 2.2 mg/dL (ref 1.7–2.4)

## 2022-10-05 NOTE — Progress Notes (Signed)
Progress Note   Patient: John [name] LKJ:[id number] DOB: 07-15-1946 DOA: 10/02/2022     3 DOS: the patient was seen and examined on 10/05/2022   Brief hospital course: Taken from prior notes.  76-year-old with history of diastolic CHF, CKD stage IV, HTN, HLD, COPD, erythrocytosis, cor pulmonale admitted for lower extremity edema and weight gain.  Minimal shortness of breath but requiring 2 L nasal cannula in the ED due to hypoxia.  Patient was seen by his nephrologist Dr. Zollie Scale outpatient and was advised to come to the hospital for IV diuretics.  Chest x-ray showed cardiomegaly, nephrology team was consulted.  Creatinine slowly improving with IV diuretics.  10/21: Hemodynamically stable.  Renal function continued to improve, creatinine at 1.72 with improvement of GFR to 41.  Weight seems stable at 220, recorded output of 2600 with net negative of -5.2 L.  Remained on Lasix infusion. Echocardiogram during current hospitalization with normal EF, grade 1 diastolic dysfunction and signs of right ventricular overload.     Assessment and Plan: * Acute on chronic diastolic CHF (congestive heart failure) ([place]) 2D echo on 01/29/2022 showed EF 55 to 60%.  Patient has leg edema, positive JVD, crackles on auscultation, elevated BNP 1103.  Patient denies shortness breath, but he has 2 L new oxygen requirement.  Consulted nephrology. -Continue with Lasix infusion -Daily weights -strict I/O's -Low salt diet -Fluid restriction   CKD (chronic kidney disease), stage IV (HCC) Creatinine continue to improve with diuresis, 1.72 this morning. -Monitor renal function -Avoid nephrotoxins  Myocardial injury Myocardial injury due to CHF exacerbation: Troponin level 32 --> 29.  Denies chest pain.  A1c of 6.3 and LDL of 74 -Continue aspirin.   COPD (chronic obstructive pulmonary disease) (HCC) Stable.  -As needed albuterol and Mucinex  Hypertension - IV hydralazine as needed -Continue  Cozaar  Hyperkalemia Resolved. -Continue to monitor  Erythrocytosis Hemoglobin 16.7 -Follow-up with CBC  Thrombocytopenia ([place]) This is a chronic issue, etiology is not clear.  Platelet is stable, 111 (112 on 02/06/2022) -Follow-up with CBC   Subjective: Patient was seen and examined today.  No new complaints.  He was asking when he can go home.  Got upset when told that he still has significant volume to be removed.  No baseline oxygen use, requiring 4 to 5 L at this time.  Physical Exam: Vitals:   10/04/22 2345 10/05/22 0355 10/05/22 0748 10/05/22 1128  BP: 114/82 111/68 111/78 107/65  Pulse: 82 84 86 84  Resp: '18 18 19 19  '$ Temp: 98.3 F (36.8 C) 98.6 F (37 C) 98.5 F (36.9 C) 98.6 F (37 C)  TempSrc:    Oral  SpO2: 94% 99% 96% 93%  Weight:  99.8 kg    Height:       General.  Ill-appearing elderly man, in no acute distress. Pulmonary.  Lungs clear bilaterally, normal respiratory effort. CV.  Regular rate and rhythm, no JVD, rub or murmur. Abdomen.  Soft, nontender, nondistended, BS positive. CNS.  Alert and oriented .  No focal neurologic deficit. Extremities.  2+ LE  edema, no cyanosis, pulses intact and symmetrical. Psychiatry.  Judgment and insight appears normal.  Data Reviewed: Prior data reviewed  Family Communication:   Disposition: Status is: Inpatient Remains inpatient appropriate because: Severity of illness   Planned Discharge Destination: Home with Home Health  DVT prophylaxis.  Lovenox Time spent: 45 minutes  This record has been created using Systems analyst. Errors have been sought and corrected,but may not  always be located. Such creation errors do not reflect on the standard of care.  Author: Lorella Nimrod, MD 10/05/2022 2:53 PM  For on call review [email or web address].

## 2022-10-05 NOTE — Assessment & Plan Note (Signed)
Creatinine continue to improve with diuresis, 1.72 this morning. -Monitor renal function -Avoid nephrotoxins

## 2022-10-05 NOTE — Hospital Course (Signed)
Taken from prior notes.  76-year-old with history of diastolic CHF, CKD stage IV, HTN, HLD, COPD, erythrocytosis, cor pulmonale admitted for lower extremity edema and weight gain.  Minimal shortness of breath but requiring 2 L nasal cannula in the ED due to hypoxia.  Patient was seen by his nephrologist Dr. Zollie Scale outpatient and was advised to come to the hospital for IV diuretics.  Chest x-ray showed cardiomegaly, nephrology team was consulted.  Creatinine slowly improving with IV diuretics.  10/21: Hemodynamically stable.  Renal function continued to improve, creatinine at 1.72 with improvement of GFR to 41.  Weight seems stable at 220, recorded output of 2600 with net negative of -5.2 L.  Remained on Lasix infusion. Echocardiogram during current hospitalization with normal EF, grade 1 diastolic dysfunction and signs of right ventricular overload.   10/22: Patient continued to have good diuresis with Lasix infusion.  Renal function continues to improve, creatinine at 1.50 today.  Still with significant volume overload.  Continue to require 4 to 5 L of oxygen, patient has home oxygen but was not using it.  Not sure about baseline.  10/23: Patient was continued on Lasix infusion, renal function continue to improve now creatinine of 1.44 and GFR of 50 which makes him CKD stage IIIa.  Stable bicarb at 39.  Remained on 5 L of oxygen but saturating 99 to 100%-we will try weaning to the minimum requirement, most likely will be going home with oxygen.  Doubt he does not need oxygen but was discharged with oxygen during prior hospitalization.  Most likely noncompliant and need education. PT and OT are now recommending SNF. Developed right hand edema, mild erythema and hyperthermia-start him on ceftriaxone for concern of cellulitis.  10/24: Patient with slight worsening of bicarb and creatinine at 1.53.  Net negative of more than 11 L. Significant improvement in lower extremity edema.  PT/OT are recommending SNF  but patient declined and wants to go home with home health services.  Discussed with nephrology and they would like to continue Lasix infusion for another 1 to 2 days before switching him to p.o. Right hand edema and pain improving. He was complaining of burning pain involving both feet, started on Neurontin-dose will need titration.  10/25: Lasix infusion was discontinued due to worsening renal function.  He will be started on torsemide 20 mg daily from tomorrow.  Family and patient now wants to proceed with SNF. TOC is aware.

## 2022-10-05 NOTE — Progress Notes (Signed)
[name]  PROGRESS NOTE   Subjective:   Patient is out of bed to chair.  Family at bedside.  Urine output is excellent. On Lasix drip. Patient seen this very difficult to follow fluid restriction.  Objective:  Vital signs: Blood pressure 114/70, pulse 88, temperature 98.5 F (36.9 C), temperature source Oral, resp. rate 20, height '5\' 10"'$  (1.778 m), weight 99.8 kg, SpO2 94 %.  Intake/Output Summary (Last 24 hours) at 10/05/2022 1557 Last data filed at 10/05/2022 1505 Gross per 24 hour  Intake 1031.32 ml  Output 3200 ml  Net -2168.68 ml   Filed Weights   10/03/22 2131 10/04/22 0420 10/05/22 0355  Weight: 101 kg 99.7 kg 99.8 kg     Physical Exam: General:  No acute distress  Head:  Normocephalic, atraumatic. Moist oral mucosal membranes  Eyes:  Anicteric  Neck:  Supple  Lungs:   Clear to auscultation, normal effort  Heart:  S1S2 no rubs  Abdomen:   Soft, nontender, bowel sounds present  Extremities: 2+ peripheral edema.  Neurologic:  Awake, alert, following commands  Skin:  No lesions  Access:     Basic Metabolic Panel: Recent Labs  Lab 10/02/22 0858 10/03/22 0010 10/04/22 0851 10/05/22 0536  NA 141 138 142 142  K 5.3* 5.7* 4.8 3.6  CL 103 97* 99 98  CO2 34* 31 36* 38*  GLUCOSE 108* 99 101* 107*  BUN 42* 37* 25* 21  CREATININE 2.41* 2.25* 1.78* 1.72*  CALCIUM 8.8* 8.9 8.5* 8.4*  MG  --  2.6* 2.2 2.2    CBC: Recent Labs  Lab 10/02/22 0858 10/03/22 1315  WBC 8.1 8.3  NEUTROABS 6.1  --   HGB 16.7 16.9  HCT 54.2* 54.9*  MCV 104.4* 106.0*  PLT 117* 98*     Urinalysis: No results for input(s): "COLORURINE", "LABSPEC", "PHURINE", "GLUCOSEU", "HGBUR", "BILIRUBINUR", "KETONESUR", "PROTEINUR", "UROBILINOGEN", "NITRITE", "LEUKOCYTESUR" in the last 72 hours.  Invalid input(s): "APPERANCEUR"    Imaging: No results found.   Medications:    furosemide (LASIX) 200 mg in dextrose 5 % 100 mL (2 mg/mL) infusion 8 mg/hr (10/05/22 0752)     aspirin EC  81 mg Oral Daily   enoxaparin (LOVENOX) injection  40 mg Subcutaneous Q24H   losartan  50 mg Oral Daily    Assessment/ Plan:     Principal Problem:   Acute on chronic diastolic CHF (congestive heart failure) (HCC) Active Problems:   COPD (chronic obstructive pulmonary disease) (HCC)   Hypertension   Erythrocytosis   Thrombocytopenia (HCC)   CKD (chronic kidney disease), stage IV ([place])   Myocardial injury   Hyperkalemia  John [name] is a 76 year old male with past medical conditions including COPD, hyperlipidemia, hypertension, CHF, and chronic kidney disease stage IV.  Patient presents to the emergency room at the advice of nephrologist for evaluation of progressive lower extremity edema.  Patient has been admitted for Acute on chronic diastolic congestive heart failure   #1: Chronic kidney disease: Patient is stage IV CKD which is most likely secondary to chronic hypertensive nephrosclerosis versus ischemic renal disease due to decreased cardiac output.  Renal indices are stable at this time.  #2: Congestive heart failure: Patient is advised on importance of strict fluid restriction.  We will continue the Lasix drip at 8 mg an hour.  Urine output is acceptable.  #3: Hypertension: We will continue the losartan for long-term cardiorenal protection.  #4: Second hyperparathyroidism: We will resume calcitriol on discharge.  Spoke to the  family at bedside.   LOS: [place], MD [facility] 10/21/20233:57 PM

## 2022-10-06 DIAGNOSIS — I5033 Acute on chronic diastolic (congestive) heart failure: Secondary | ICD-10-CM | POA: Diagnosis not present

## 2022-10-06 LAB — CBC
HCT: 52.5 % — ABNORMAL HIGH (ref 39.0–52.0)
Hemoglobin: 15.8 g/dL (ref 13.0–17.0)
MCH: 31.7 pg (ref 26.0–34.0)
MCHC: 30.1 g/dL (ref 30.0–36.0)
MCV: 105.4 fL — ABNORMAL HIGH (ref 80.0–100.0)
Platelets: 81 10*3/uL — ABNORMAL LOW (ref 150–400)
RBC: 4.98 MIL/uL (ref 4.22–5.81)
RDW: 13.4 % (ref 11.5–15.5)
WBC: 7.7 10*3/uL (ref 4.0–10.5)
nRBC: 0 % (ref 0.0–0.2)

## 2022-10-06 LAB — BASIC METABOLIC PANEL
Anion gap: 7 (ref 5–15)
BUN: 19 mg/dL (ref 8–23)
CO2: 39 mmol/L — ABNORMAL HIGH (ref 22–32)
Calcium: 8.4 mg/dL — ABNORMAL LOW (ref 8.9–10.3)
Chloride: 98 mmol/L (ref 98–111)
Creatinine, Ser: 1.5 mg/dL — ABNORMAL HIGH (ref 0.61–1.24)
GFR, Estimated: 48 mL/min — ABNORMAL LOW (ref >60)
Glucose, Bld: 113 mg/dL — ABNORMAL HIGH (ref 70–99)
Potassium: 3.4 mmol/L — ABNORMAL LOW (ref 3.5–5.1)
Sodium: 144 mmol/L (ref 135–145)

## 2022-10-06 LAB — MAGNESIUM: Magnesium: 2.1 mg/dL (ref 1.7–2.4)

## 2022-10-06 MED ORDER — POTASSIUM CHLORIDE 20 MEQ PO PACK
40.0000 meq | PACK | Freq: Once | ORAL | Status: AC
  Administered 2022-10-06: 40 meq via ORAL
  Filled 2022-10-06: qty 2

## 2022-10-06 NOTE — TOC Initial Note (Signed)
Transition of Care [facility]) - Initial/Assessment Note    Patient Details  Name: John [name] MRN: [id number] Date of Birth: 07/15/1946  Transition of [facility] CM/SW Contact:    Eileen Stanford, LCSW Phone Number: 10/06/2022, 3:09 PM  Clinical Narrative:     CSW spoke with pt and pt is agreeable to [facility]. Pt states he has [facility] in the past and is agreeable to have them again. Tommi Rumps states they can take him. Pt states he has all the dme he needs at home.               Expected Discharge Plan: [place] Barriers to Discharge: Continued Medical Work up   Patient Goals and CMS Choice Patient states their goals for this hospitalization and ongoing recovery are:: to go home   Choice offered to / list presented to : Patient  Expected Discharge Plan and Services Expected Discharge Plan: [place] In-house Referral: Clinical Social Work   Post Acute Care Choice: Home Health Living arrangements for the past 2 months: Single Family Home                           HH Arranged: RN, PT, OT HH Agency: [place] Date [facility] Agency Contacted: 10/06/22 Time HH Agency Contacted: 1508 Representative spoke with at [place]: cory  Prior Living Arrangements/Services Living arrangements for the past 2 months: [place] with:: Significant Other Patient language and need for interpreter reviewed:: Yes Do you feel safe going back to the place where you live?: Yes      Need for Family Participation in Patient Care: Yes (Comment) Care giver support system in place?: Yes (comment)   Criminal Activity/Legal Involvement Pertinent to Current Situation/Hospitalization: No - Comment as needed  Activities of Daily Living Home Assistive Devices/Equipment: None ADL Screening (condition at time of admission) Patient's cognitive ability adequate to safely complete daily activities?: Yes Is the patient deaf or have difficulty hearing?: No Does  the patient have difficulty seeing, even when wearing glasses/contacts?: No Does the patient have difficulty concentrating, remembering, or making decisions?: No Patient able to express need for assistance with ADLs?: Yes Does the patient have difficulty dressing or bathing?: No Independently performs ADLs?: Yes (appropriate for developmental age) Does the patient have difficulty walking or climbing stairs?: No Weakness of Legs: None Weakness of Arms/Hands: None  Permission Sought/Granted Permission sought to share information with : Family Supports Permission granted to share information with : Yes, Verbal Permission Granted  Share Information with NAME: nancy     Permission granted to share info w Relationship: friend     Emotional Assessment Appearance:: Appears stated age Attitude/Demeanor/Rapport: Engaged Affect (typically observed): Appropriate Orientation: : Oriented to Self, Oriented to Place, Oriented to  Time, Oriented to Situation Alcohol / Substance Use: Not Applicable Psych Involvement: No (comment)  Admission diagnosis:  Acute on chronic diastolic congestive heart failure (HCC) [I50.33] Acute on chronic systolic congestive heart failure (HCC) [I50.23] Acute respiratory failure with hypoxia ([place]) [J96.01] Acute on chronic diastolic CHF (congestive heart failure) ([place]) [I50.33] Stage 4 chronic kidney disease ([place]) [N18.4] Patient Active Problem List   Diagnosis Date Noted   Acute on chronic diastolic CHF (congestive heart failure) ([place]) 10/02/2022   Myocardial injury 10/02/2022   Hyperkalemia 10/02/2022   Acute respiratory failure with hypoxia ([place]) 01/28/2022   Cardiorenal syndrome, stage 1-4 or unspecified chronic kidney disease, with heart  failure ([place]) 01/28/2022   Acute left systolic heart failure ([place]) 01/28/2022   Cor pulmonale, acute ([place]) 01/28/2022   Renal failure syndrome 01/28/2022   CKD (chronic kidney disease), stage IV ([place]) 01/28/2022   COPD  exacerbation ([place]) 01/28/2022   Cardiogenic shock ([place]) 01/28/2022   Erythrocytosis 12/20/2021   Thrombocytopenia ([place]) 12/20/2021   Acute CHF (congestive heart failure) ([place]) 06/21/2020   Hypertension    COPD (chronic obstructive pulmonary disease) ([place]) 02/16/2020   Rupture of left quadriceps tendon 02/15/2020   Community acquired pneumonia 09/01/2019   Pneumonia 09/01/2019   PCP:  Tracie Harrier, MD Pharmacy:   [facility] [address]), [place] [address] [place] [facility]) [place] [address] Phone: [phone] Fax: [phone]     Social Determinants of Health (SDOH) Interventions    Readmission Risk Interventions     No data to display

## 2022-10-06 NOTE — Progress Notes (Signed)
[name]  PROGRESS NOTE   Subjective:   Out of bed to chair.  Breathing has improved.  Objective:  Vital signs: Blood pressure 121/81, pulse 88, temperature 97.9 F (36.6 C), resp. rate 20, height '5\' 10"'$  (1.778 m), weight 99.8 kg, SpO2 95 %.  Intake/Output Summary (Last 24 hours) at 10/06/2022 1054 Last data filed at 10/06/2022 1000 Gross per 24 hour  Intake 509.01 ml  Output 2950 ml  Net -2440.99 ml   Filed Weights   10/03/22 2131 10/04/22 0420 10/05/22 0355  Weight: 101 kg 99.7 kg 99.8 kg     Physical Exam: General:  No acute distress  Head:  Normocephalic, atraumatic. Moist oral mucosal membranes  Eyes:  Anicteric  Neck:  Supple  Lungs:   Clear to auscultation, normal effort  Heart:  S1S2 no rubs  Abdomen:   Soft, nontender, bowel sounds present  Extremities: 2+ peripheral edema.  Neurologic:  Awake, alert, following commands  Skin:  No lesions  Access:     Basic Metabolic Panel: Recent Labs  Lab 10/02/22 0858 10/03/22 0010 10/04/22 0851 10/05/22 0536 10/06/22 0527  NA 141 138 142 142 144  K 5.3* 5.7* 4.8 3.6 3.4*  CL 103 97* 99 98 98  CO2 34* 31 36* 38* 39*  GLUCOSE 108* 99 101* 107* 113*  BUN 42* 37* 25* 21 19  CREATININE 2.41* 2.25* 1.78* 1.72* 1.50*  CALCIUM 8.8* 8.9 8.5* 8.4* 8.4*  MG  --  2.6* 2.2 2.2 2.1    CBC: Recent Labs  Lab 10/02/22 0858 10/03/22 1315 10/06/22 0527  WBC 8.1 8.3 7.7  NEUTROABS 6.1  --   --   HGB 16.7 16.9 15.8  HCT 54.2* 54.9* 52.5*  MCV 104.4* 106.0* 105.4*  PLT 117* 98* 81*     Urinalysis: No results for input(s): "COLORURINE", "LABSPEC", "PHURINE", "GLUCOSEU", "HGBUR", "BILIRUBINUR", "KETONESUR", "PROTEINUR", "UROBILINOGEN", "NITRITE", "LEUKOCYTESUR" in the last 72 hours.  Invalid input(s): "APPERANCEUR"    Imaging: No results found.   Medications:    furosemide (LASIX) 200 mg in dextrose 5 % 100 mL (2 mg/mL) infusion 8 mg/hr (10/06/22 0044)    aspirin EC  81 mg Oral Daily    enoxaparin (LOVENOX) injection  40 mg Subcutaneous Q24H   losartan  50 mg Oral Daily    Assessment/ Plan:     Principal Problem:   Acute on chronic diastolic CHF (congestive heart failure) (HCC) Active Problems:   COPD (chronic obstructive pulmonary disease) (HCC)   Hypertension   Erythrocytosis   Thrombocytopenia (HCC)   CKD (chronic kidney disease), stage IV (HCC)   Myocardial injury   Hyperkalemia  John [name] is a 76 year old male with past medical conditions including COPD, hyperlipidemia, hypertension, CHF, and chronic kidney disease stage IV.  Patient presents to the emergency room at the advice of nephrologist for evaluation of progressive lower extremity edema.  Patient has been admitted for Acute on chronic diastolic congestive heart failure    #1: Chronic kidney disease: Patient is stage IV CKD which is most likely secondary to chronic hypertensive nephrosclerosis versus ischemic renal disease due to decreased cardiac output.  Renal indices are stable at this time.   #2: Congestive heart failure: Patient is advised on importance of strict fluid restriction.  We will continue the Lasix drip at 8 mg an hour.  Urine output is acceptable.   #3: Hypertension: We will continue the losartan for long-term cardiorenal protection.   #4: Second hyperparathyroidism: We will resume calcitriol on discharge.  #  5: Hypokalemia: We will supplement 40 mEq today.   Spoke to the family at bedside.   LOS: [place], MD [facility] 10/22/202310:54 AM

## 2022-10-06 NOTE — Assessment & Plan Note (Signed)
Creatinine with some worsening to 1.74, Lasix infusion was discontinued -Monitor renal function -Avoid nephrotoxins

## 2022-10-06 NOTE — Progress Notes (Signed)
Progress Note   Patient: John [name] BZJ:[id number] DOB: Dec 16, 1946 DOA: 10/02/2022     4 DOS: the patient was seen and examined on 10/06/2022   Brief hospital course: Taken from prior notes.  76-year-old with history of diastolic CHF, CKD stage IV, HTN, HLD, COPD, erythrocytosis, cor pulmonale admitted for lower extremity edema and weight gain.  Minimal shortness of breath but requiring 2 L nasal cannula in the ED due to hypoxia.  Patient was seen by his nephrologist Dr. Zollie Scale outpatient and was advised to come to the hospital for IV diuretics.  Chest x-ray showed cardiomegaly, nephrology team was consulted.  Creatinine slowly improving with IV diuretics.  10/21: Hemodynamically stable.  Renal function continued to improve, creatinine at 1.72 with improvement of GFR to 41.  Weight seems stable at 220, recorded output of 2600 with net negative of -5.2 L.  Remained on Lasix infusion. Echocardiogram during current hospitalization with normal EF, grade 1 diastolic dysfunction and signs of right ventricular overload.   10/22: Patient continued to have good diuresis with Lasix infusion.  Renal function continues to improve, creatinine at 1.50 today.  Still with significant volume overload.  Continue to require 4 to 5 L of oxygen, patient has home oxygen but was not using it.  Not sure about baseline.   Assessment and Plan: * Acute on chronic diastolic CHF (congestive heart failure) ([place]) 2D echo on 01/29/2022 showed EF 55 to 60%.  Patient has leg edema, positive JVD, crackles on auscultation, elevated BNP 1103.  Patient denies shortness breath, has new oxygen requirement, not sure about baseline, as he has home oxygen but was not using it.  Clinically remained volume overload and improvement of renal function with diuresis  Consulted nephrology. -Continue with Lasix infusion -Daily weights -strict I/O's -Low salt diet -Fluid restriction   CKD (chronic kidney disease), stage IV  (HCC) Creatinine continue to improve with diuresis, 1.50 this morning.  Which makes him CKD stage IIIb. -Monitor renal function -Avoid nephrotoxins  Myocardial injury Myocardial injury due to CHF exacerbation: Troponin level 32 --> 29.  Denies chest pain.  A1c of 6.3 and LDL of 74 -Continue aspirin.   COPD (chronic obstructive pulmonary disease) (HCC) Stable.  -As needed albuterol and Mucinex  Hypertension - IV hydralazine as needed -Continue Cozaar  Hyperkalemia Resolved. -Continue to monitor  Erythrocytosis Hemoglobin 16.7 -Follow-up with CBC  Thrombocytopenia ([place]) This is a chronic issue, etiology is not clear.  Platelet is stable, 111 (112 on 02/06/2022) -Follow-up with CBC   Subjective: Patient was sitting in chair comfortably when seen today.  He was again asking about going home.  Continued to require 4 L of oxygen, per patient he do have home oxygen but he was not using it.  Physical Exam: Vitals:   10/06/22 0038 10/06/22 0414 10/06/22 0809 10/06/22 1151  BP: 123/78 104/63 121/81 (!) 132/93  Pulse: 86 88 88 95  Resp: '18 18 20 18  '$ Temp: 98 F (36.7 C) 97.7 F (36.5 C) 97.9 F (36.6 C) 97.9 F (36.6 C)  TempSrc:  Oral    SpO2: 95% 99% 95% 97%  Weight:      Height:       General.  Overweight elderly man, in no acute distress. Pulmonary.  Few scattered crackles bilaterally, normal respiratory effort. CV.  Regular rate and rhythm, no JVD, rub or murmur. Abdomen.  Soft, nontender, nondistended, BS positive. CNS.  Alert and oriented .  No focal neurologic deficit. Extremities.  2+ LE edema, no  cyanosis, pulses intact and symmetrical. Psychiatry.  Judgment and insight appears normal.   Data Reviewed: Prior data reviewed  Family Communication: Talked with a friend [name] who was listed in the context, patient lives with her.  Disposition: Status is: Inpatient Remains inpatient appropriate because: Severity of illness   Planned Discharge Destination:  Home with Home Health  DVT prophylaxis.  Lovenox Time spent: 45 minutes  This record has been created using Systems analyst. Errors have been sought and corrected,but may not always be located. Such creation errors do not reflect on the standard of care.  Author: Lorella Nimrod, MD 10/06/2022 3:31 PM  For on call review [email or web address].

## 2022-10-06 NOTE — Plan of Care (Signed)

## 2022-10-06 NOTE — Assessment & Plan Note (Signed)
2D echo on 01/29/2022 showed EF 55 to 60%.  Patient has leg edema, positive JVD, crackles on auscultation, elevated BNP 1103.  Patient denies shortness breath, has new oxygen requirement, not sure about baseline, as he has home oxygen but was not using it.  Volume status improving, worsening creatinine so Lasix infusion was discontinued.  Nephrology is on board  -Transitioning to torsemide 20 mg daily from tomorrow -Daily weights -strict I/O's -Low salt diet -Fluid restriction

## 2022-10-06 NOTE — Assessment & Plan Note (Signed)
-   IV hydralazine as needed -Continue Cozaar

## 2022-10-07 DIAGNOSIS — L0390 Cellulitis, unspecified: Secondary | ICD-10-CM | POA: Diagnosis not present

## 2022-10-07 DIAGNOSIS — I5033 Acute on chronic diastolic (congestive) heart failure: Secondary | ICD-10-CM | POA: Diagnosis not present

## 2022-10-07 LAB — BASIC METABOLIC PANEL
Anion gap: 9 (ref 5–15)
BUN: 16 mg/dL (ref 8–23)
CO2: 39 mmol/L — ABNORMAL HIGH (ref 22–32)
Calcium: 8.8 mg/dL — ABNORMAL LOW (ref 8.9–10.3)
Chloride: 94 mmol/L — ABNORMAL LOW (ref 98–111)
Creatinine, Ser: 1.44 mg/dL — ABNORMAL HIGH (ref 0.61–1.24)
GFR, Estimated: 50 mL/min — ABNORMAL LOW (ref >60)
Glucose, Bld: 112 mg/dL — ABNORMAL HIGH (ref 70–99)
Potassium: 3.4 mmol/L — ABNORMAL LOW (ref 3.5–5.1)
Sodium: 142 mmol/L (ref 135–145)

## 2022-10-07 LAB — MAGNESIUM: Magnesium: 2 mg/dL (ref 1.7–2.4)

## 2022-10-07 MED ORDER — SODIUM CHLORIDE 0.9 % IV SOLN
1.0000 g | INTRAVENOUS | Status: AC
  Administered 2022-10-07 – 2022-10-10 (×4): 1 g via INTRAVENOUS
  Filled 2022-10-07 (×4): qty 10

## 2022-10-07 MED ORDER — PANTOPRAZOLE SODIUM 20 MG PO TBEC
20.0000 mg | DELAYED_RELEASE_TABLET | Freq: Every day | ORAL | Status: DC
  Administered 2022-10-07 – 2022-10-15 (×9): 20 mg via ORAL
  Filled 2022-10-07 (×9): qty 1

## 2022-10-07 MED ORDER — POTASSIUM CHLORIDE CRYS ER 20 MEQ PO TBCR
40.0000 meq | EXTENDED_RELEASE_TABLET | Freq: Once | ORAL | Status: AC
  Administered 2022-10-07: 40 meq via ORAL
  Filled 2022-10-07: qty 2

## 2022-10-07 MED ORDER — METOLAZONE 5 MG PO TABS
5.0000 mg | ORAL_TABLET | Freq: Once | ORAL | Status: AC
  Administered 2022-10-07: 5 mg via ORAL
  Filled 2022-10-07: qty 1

## 2022-10-07 NOTE — Assessment & Plan Note (Addendum)
Involving the right hand, improving -Continue with ceftriaxone while in the hospital-can be discharged on Doxy to complete a 7-day course.

## 2022-10-07 NOTE — Progress Notes (Addendum)
[name]  PROGRESS NOTE   Subjective:   Patient seen sitting up in chair, alert and oriented Remains on furosemide drip States he feels some improvement has been made on drip Voices concerns of congestion and chronic throat clearing. Right hand edema present, patient states his hand swells periodically.   Objective:  Vital signs: Blood pressure (!) 123/93, pulse 81, temperature 98.3 F (36.8 C), resp. rate 12, height '5\' 10"'$  (1.778 m), weight 99.8 kg, SpO2 99 %.  Intake/Output Summary (Last 24 hours) at 10/07/2022 1508 Last data filed at 10/07/2022 1503 Gross per 24 hour  Intake 513.21 ml  Output 750 ml  Net -236.79 ml    Filed Weights   10/03/22 2131 10/04/22 0420 10/05/22 0355  Weight: 101 kg 99.7 kg 99.8 kg     Physical Exam: General:  No acute distress  Head:  Normocephalic, atraumatic. Moist oral mucosal membranes  Eyes:  Anicteric  Lungs:   Clear to auscultation, normal effort  Heart:  S1S2 no rubs  Abdomen:   Soft, nontender, bowel sounds present  Extremities: 2+ peripheral edema.  Neurologic:  Awake, alert, following commands  Skin:  No lesions  Access: None    Basic Metabolic Panel: Recent Labs  Lab 10/03/22 0010 10/04/22 0851 10/05/22 0536 10/06/22 0527 10/07/22 0659  NA 138 142 142 144 142  K 5.7* 4.8 3.6 3.4* 3.4*  CL 97* 99 98 98 94*  CO2 31 36* 38* 39* 39*  GLUCOSE 99 101* 107* 113* 112*  BUN 37* 25* '21 19 16  '$ CREATININE 2.25* 1.78* 1.72* 1.50* 1.44*  CALCIUM 8.9 8.5* 8.4* 8.4* 8.8*  MG 2.6* 2.2 2.2 2.1 2.0     CBC: Recent Labs  Lab 10/02/22 0858 10/03/22 1315 10/06/22 0527  WBC 8.1 8.3 7.7  NEUTROABS 6.1  --   --   HGB 16.7 16.9 15.8  HCT 54.2* 54.9* 52.5*  MCV 104.4* 106.0* 105.4*  PLT 117* 98* 81*      Urinalysis: No results for input(s): "COLORURINE", "LABSPEC", "PHURINE", "GLUCOSEU", "HGBUR", "BILIRUBINUR", "KETONESUR", "PROTEINUR", "UROBILINOGEN", "NITRITE", "LEUKOCYTESUR" in the last 72  hours.  Invalid input(s): "APPERANCEUR"    Imaging: No results found.   Medications:    cefTRIAXone (ROCEPHIN)  IV 1 g (10/07/22 1349)   furosemide (LASIX) 200 mg in dextrose 5 % 100 mL (2 mg/mL) infusion 8 mg/hr (10/07/22 1503)    aspirin EC  81 mg Oral Daily   enoxaparin (LOVENOX) injection  40 mg Subcutaneous Q24H   losartan  50 mg Oral Daily   pantoprazole  20 mg Oral Daily    Assessment/ Plan:     Principal Problem:   Acute on chronic diastolic CHF (congestive heart failure) (HCC) Active Problems:   COPD (chronic obstructive pulmonary disease) (HCC)   Hypertension   Erythrocytosis   Thrombocytopenia (HCC)   CKD (chronic kidney disease), stage IV ([place])   Myocardial injury   Hyperkalemia  John [name] is a 76 year old male with past medical conditions including COPD, hyperlipidemia, hypertension, CHF, and chronic kidney disease stage IV.  Patient presents to the emergency room at the advice of nephrologist for evaluation of progressive lower extremity edema.  Patient has been admitted for Acute on chronic diastolic congestive heart failure    #1: Chronic kidney disease with chronic kidney disease stage IV: CKD which is most likely secondary to chronic hypertensive nephrosclerosis versus ischemic renal disease due to decreased cardiac output.  Creatinine now stable. Remains on Furosemide drip.    #2: Congestive heart  failure: ECHO from 10/02/22 shows EF 50-55% with mild LVH and a grade 1 diastolic dysfunction. Patient is advised on importance of strict fluid restriction.  We will continue the Furosemide drip at 8 mg an hour.    #3: Hypertension with chronic kidney disease: We will continue the losartan for long-term cardiorenal protection.   #4: Second hyperparathyroidism: We will resume calcitriol on discharge. Calcium within desired target.  #5: Hypokalemia: Continue oral potassium 40 mEq today.    LOS: [facility] 10/23/20233:08 PM

## 2022-10-07 NOTE — Progress Notes (Addendum)
Occupational Therapy Treatment Patient Details Name: John [name] MRN: [id number] DOB: Jan 27, 1946 Today's Date: 10/07/2022   History of present illness Pt is a 76 year old with history of diastolic CHF, CKD stage IV, HTN, HLD, COPD, erythrocytosis, cor pulmonale admitted for lower extremity edema and weight gain.  Minimal shortness of breath but requiring 2 L nasal cannula in the ED due to hypoxia.  Patient was seen by his nephrologist Dr. Zollie Scale outpatient and was advised to come to the hospital for IV diuretics.   OT comments  Pt seen for OT tx and co-tx with PT to optimize ADL mobility. Pt initially sleeping but wakes with time and gentle verbal cues. Pt agreeable to session, reporting feeling close to baseline. Pt noted with R hand edema (new to this therapist as compared to Friday's session). Pt also notes R knee stiffness. Pt instructed in ADL transfers with cues for hand placement to accommodate for R hand swelling, R knee stiffness, and improve technique/safety. Pt required MOD-MAX A to stand from the recliner with RW and initially requiring MIN A for poor static standing balance until pt able to reposition slightly with balance improving to fair. Once in standing, pt able to march in place with CGA with RW. PT entered session and pt agreeable to walk. Pt required CGA +2 for safety/lines mgt to walk ~17'. VSS throughout, SpO2 >93% throughout and pt denies SOB. On 3L throughout. Pt demonstrated increased need for assist with ADL transfers and mobility this date. Also demo's questionable insight into deficits as compared to true baseline. Friend/roommate present and expresses confidence in pt's return home despite the need to negotiate 2 steps into the home and in spite of the level of assist required to stand this date (friend not present for initial STS transfer). Given increased impairments and resulting increased functional deficits, OT changing recommendation to SNF at this time. Will continue  to work towards pt's goal of returning home.    Recommendations for follow up therapy are one component of a multi-disciplinary discharge planning process, led by the attending physician.  Recommendations may be updated based on patient status, additional functional criteria and insurance authorization.    Follow Up Recommendations  Skilled nursing-short term rehab (<3 hours/day)    Assistance Recommended at Discharge Frequent or constant Supervision/Assistance  Patient can return home with the following  A lot of help with walking and/or transfers;A lot of help with bathing/dressing/bathroom;Assistance with cooking/housework;Assist for transportation;Help with stairs or ramp for entrance   Equipment Recommendations  Other (comment) (defer to next venue)    Recommendations for Other Services      Precautions / Restrictions Precautions Precautions: Fall Restrictions Weight Bearing Restrictions: No       Mobility Bed Mobility               General bed mobility comments: up in recliner at start of session    Transfers Overall transfer level: Needs assistance Equipment used: Rolling walker (2 wheels) Transfers: Sit to/from Stand Sit to Stand: Mod assist, Max assist           General transfer comment: increased time/effort, VC for hand placement     Balance Overall balance assessment: Needs assistance Sitting-balance support: Feet supported, Single extremity supported, No upper extremity supported Sitting balance-Leahy Scale: Fair     Standing balance support: Bilateral upper extremity supported, During functional activity, Reliant on assistive device for balance Standing balance-Leahy Scale: Fair Standing balance comment: initially poor, improving with time in standing  ADL either performed or assessed with clinical judgement   ADL                                              Extremity/Trunk Assessment               Vision       Perception     Praxis      Cognition Arousal/Alertness: Awake/alert Behavior During Therapy: Flat affect                                   General Comments: Initially sleeping, wakes with time and cues. Pt follows commands well. Noted some questionable awareness of deficits as compared to baseline getting varied responses depending on how the question is phrased.        Exercises Other Exercises Other Exercises: Pt ambulated ~17' in room with RW with CGAx2    Shoulder Instructions       General Comments      Pertinent Vitals/ Pain       Pain Assessment Pain Assessment: Faces Faces Pain Scale: Hurts little more Pain Location: R hand swollen, pain w/ movement; R knee stiff Pain Descriptors / Indicators: Aching, Grimacing, Guarding Pain Intervention(s): Monitored during session, Repositioned  Home Living                                          Prior Functioning/Environment              Frequency  Min 2X/week        Progress Toward Goals  OT Goals(current goals can now be found in the care plan section)  Progress towards OT goals: OT to reassess next treatment  Acute Rehab OT Goals Patient Stated Goal: get better and go home OT Goal Formulation: With patient Time For Goal Achievement: 10/17/22 Potential to Achieve Goals: Good  Plan Discharge plan needs to be updated;Frequency remains appropriate    Co-evaluation    PT/OT/SLP Co-Evaluation/Treatment: Yes Reason for Co-Treatment: Necessary to address cognition/behavior during functional activity;For patient/therapist safety;To address functional/ADL transfers PT goals addressed during session: Mobility/safety with mobility;Proper use of DME OT goals addressed during session: ADL's and self-care;Strengthening/ROM;Proper use of Adaptive equipment and DME      AM-PAC OT "6 Clicks" Daily Activity     Outcome Measure   Help from another  person eating meals?: None Help from another person taking care of personal grooming?: None Help from another person toileting, which includes using toliet, bedpan, or urinal?: A Lot Help from another person bathing (including washing, rinsing, drying)?: A Little Help from another person to put on and taking off regular upper body clothing?: None Help from another person to put on and taking off regular lower body clothing?: A Little 6 Click Score: 20    End of Session Equipment Utilized During Treatment: Gait belt;Oxygen;Rolling walker (2 wheels)  OT Visit Diagnosis: Other abnormalities of gait and mobility (R26.89);Muscle weakness (generalized) (M62.81)   Activity Tolerance Patient tolerated treatment well   Patient Left in chair;with call bell/phone within reach;with family/visitor present;with nursing/sitter in room   Nurse Communication Mobility status;Other (comment) (R hand swollen)        Time: 8841-6606  OT Time Calculation (min): 25 min  Charges: OT General Charges $OT Visit: 1 Visit OT Treatments $Therapeutic Activity: 8-22 mins  Ardeth Perfect., MPH, MS, OTR/L ascom [phone] 10/07/22, 2:29 PM

## 2022-10-07 NOTE — Consult Note (Signed)
   Heart Failure Nurse Navigator Note  HFrEF 50 to 55%.  Mild LVH.  Grade 1 diastolic dysfunction.  D-shaped ventricle.  Right ventricular systolic function moderately reduced.  Mild left atrial enlargement.  Moderate right atrial enlargement.  Mild to moderate tricuspid regurgitation.  He presented to the emergency room with complaints of worsening leg edema and weight gain.  BNP 1103.  Chest x-ray revealed cardiomegaly.  Comorbidities:  Chronic kidney disease stage IV Hypertension Hyperlipidemia COPD Cor pulmonale  Medications:  Furosemide infusion at 4 mg an hour. Losartan 50 mg daily   Labs:  Sodium 142, potassium 3.4, chloride 94, CO2 39, BUN 16, creatinine 1.44, GFR 50 Weight is not documented Blood pressure 123/93 Intake 441 mL Output 1150 mL   Initial meeting with patient, he was sitting in the chair at bedside with his legs elevated.  He continues on furosemide infusion.  He states that he lives with a friend and the friend and is the 1 who prepares the meals.  He states that they do not cook with salt nor does he added at the table.  He was very vague about the amount of liquids that he drinks daily.  Instructed that it is recommended that he drink no more than 64 ounces or eight 8 ounce cups in a days time.  He states that he had been weighing himself daily and had noted a fourteen pound weight gain.  Went over the importance of daily weights and reporting a 2 pound weight gain overnight or 5 pounds within the week.  He voices understanding.  Discussed follow-up in the outpatient heart failure clinic right now he has an appointment on November 1 at 10 AM.  14% no-show which is 6 out of 143 appointments.  He had no further questions.  Pricilla Riffle RN [facility]

## 2022-10-07 NOTE — Progress Notes (Signed)
Physical Therapy Treatment Patient Details Name: John [name] MRN: [id number] DOB: Sep 07, 1946 Today's Date: 10/07/2022   History of Present Illness Pt is a 76 year old with history of diastolic CHF, CKD stage IV, HTN, HLD, COPD, erythrocytosis, cor pulmonale admitted for lower extremity edema and weight gain.  Minimal shortness of breath but requiring 2 L nasal cannula in the ED due to hypoxia.  Patient was seen by his nephrologist Dr. Zollie Scale outpatient and was advised to come to the hospital for IV diuretics.    PT Comments    Patient alert, standing with OT upon PT entrance. PT provided second assist to maximize function, safety, and manage lines/leads during ambulation. The patient was able to ambulate ~23f with RW and CGAx2, noted for decreased velocity, decreased step heigh/length, and family endorsed he is normally ambulatory without assistance or device. Pt appeared mildly fatigued after activity. Noted for R hand swelling and RN in room at end of session. Due to decline in pt function and limitations in endurance/activity tolerance, recommendation updated to SNF. PT/pt/family discussed DME at home, and concerns about stair navigation.       Recommendations for follow up therapy are one component of a multi-disciplinary discharge planning process, led by the attending physician.  Recommendations may be updated based on patient status, additional functional criteria and insurance authorization.  Follow Up Recommendations  Skilled nursing-short term rehab (<3 hours/day) Can patient physically be transported by private vehicle: Yes   Assistance Recommended at Discharge Intermittent Supervision/Assistance  Patient can return home with the following Assistance with cooking/housework;Assist for transportation;Help with stairs or ramp for entrance;Direct supervision/assist for medications management;A lot of help with walking and/or transfers;A lot of help with bathing/dressing/bathroom    Equipment Recommendations  Other (comment) (TBD)    Recommendations for Other Services       Precautions / Restrictions Precautions Precautions: Fall Restrictions Weight Bearing Restrictions: No     Mobility  Bed Mobility               General bed mobility comments: up in recliner at start of session    Transfers Overall transfer level: Needs assistance Equipment used: Rolling walker (2 wheels) Transfers: Sit to/from Stand Sit to Stand: Mod assist, Max assist, +2 safety/equipment           General transfer comment: increased time/effort, VC for hand placement: per OT. able to return to sitting with CGA    Ambulation/Gait Ambulation/Gait assistance: Min guard, +2 safety/equipment Gait Distance (Feet): 17 Feet Assistive device: Rolling walker (2 wheels)         General Gait Details: more reliant on BUE support compared to evaluation   Stairs             Wheelchair Mobility    Modified Rankin (Stroke Patients Only)       Balance Overall balance assessment: Needs assistance Sitting-balance support: Feet supported, Single extremity supported, No upper extremity supported Sitting balance-Leahy Scale: Fair     Standing balance support: Bilateral upper extremity supported, During functional activity, Reliant on assistive device for balance Standing balance-Leahy Scale: Fair                              Cognition Arousal/Alertness: Awake/alert Behavior During Therapy: Flat affect                                   General  Comments: Initially sleeping, wakes with time and cues. Pt follows commands well. Noted some questionable awareness of deficits as compared to baseline getting varied responses depending on how the question is phrased.        Exercises      General Comments        Pertinent Vitals/Pain Pain Assessment Pain Assessment: Faces Faces Pain Scale: Hurts little more Pain Location: R hand  swollen, pain w/ movement Pain Descriptors / Indicators: Aching, Grimacing, Guarding Pain Intervention(s): Monitored during session, Repositioned, Limited activity within patient's tolerance    Home Living                          Prior Function            PT Goals (current goals can now be found in the care plan section) Progress towards PT goals: Progressing toward goals    Frequency    Min 2X/week      PT Plan Discharge plan needs to be updated    Co-evaluation PT/OT/SLP Co-Evaluation/Treatment: Yes Reason for Co-Treatment: Necessary to address cognition/behavior during functional activity;For patient/therapist safety;To address functional/ADL transfers PT goals addressed during session: Mobility/safety with mobility;Proper use of DME OT goals addressed during session: ADL's and self-care;Strengthening/ROM;Proper use of Adaptive equipment and DME      AM-PAC PT "6 Clicks" Mobility   Outcome Measure  Help needed turning from your back to your side while in a flat bed without using bedrails?: A Lot Help needed moving from lying on your back to sitting on the side of a flat bed without using bedrails?: A Lot Help needed moving to and from a bed to a chair (including a wheelchair)?: A Little Help needed standing up from a chair using your arms (e.g., wheelchair or bedside chair)?: A Little Help needed to walk in hospital room?: A Little Help needed climbing 3-5 steps with a railing? : A Lot 6 Click Score: 15    End of Session Equipment Utilized During Treatment: Gait belt;Oxygen Activity Tolerance: Patient tolerated treatment well Patient left: in chair;with call bell/phone within reach;with family/visitor present;with nursing/sitter in room Nurse Communication: Mobility status PT Visit Diagnosis: Other abnormalities of gait and mobility (R26.89);Difficulty in walking, not elsewhere classified (R26.2);Muscle weakness (generalized) (M62.81)     Time:  5102-5852 PT Time Calculation (min) (ACUTE ONLY): 8 min  Charges:  $Therapeutic Activity: 8-22 mins                     Lieutenant Diego PT, DPT 2:30 PM,10/07/22

## 2022-10-07 NOTE — Progress Notes (Signed)
Progress Note   Patient: John [name] GYI:[id number] DOB: 12/06/1946 DOA: 10/02/2022     5 DOS: the patient was seen and examined on 10/07/2022   Brief hospital course: Taken from prior notes.  76-year-old with history of diastolic CHF, CKD stage IV, HTN, HLD, COPD, erythrocytosis, cor pulmonale admitted for lower extremity edema and weight gain.  Minimal shortness of breath but requiring 2 L nasal cannula in the ED due to hypoxia.  Patient was seen by his nephrologist Dr. Zollie Scale outpatient and was advised to come to the hospital for IV diuretics.  Chest x-ray showed cardiomegaly, nephrology team was consulted.  Creatinine slowly improving with IV diuretics.  10/21: Hemodynamically stable.  Renal function continued to improve, creatinine at 1.72 with improvement of GFR to 41.  Weight seems stable at 220, recorded output of 2600 with net negative of -5.2 L.  Remained on Lasix infusion. Echocardiogram during current hospitalization with normal EF, grade 1 diastolic dysfunction and signs of right ventricular overload.   10/22: Patient continued to have good diuresis with Lasix infusion.  Renal function continues to improve, creatinine at 1.50 today.  Still with significant volume overload.  Continue to require 4 to 5 L of oxygen, patient has home oxygen but was not using it.  Not sure about baseline.  10/23: Patient was continued on Lasix infusion, renal function continue to improve now creatinine of 1.44 and GFR of 50 which makes him CKD stage IIIa.  Stable bicarb at 39.  Remained on 5 L of oxygen but saturating 99 to 100%-we will try weaning to the minimum requirement, most likely will be going home with oxygen.  Doubt he does not need oxygen but was discharged with oxygen during prior hospitalization.  Most likely noncompliant and need education. PT and OT are now recommending SNF. Developed right hand edema, mild erythema and hyperthermia-start him on ceftriaxone for concern of  cellulitis.   Assessment and Plan: * Acute on chronic diastolic CHF (congestive heart failure) ([place]) 2D echo on 01/29/2022 showed EF 55 to 60%.  Patient has leg edema, positive JVD, crackles on auscultation, elevated BNP 1103.  Patient denies shortness breath, has new oxygen requirement, not sure about baseline, as he has home oxygen but was not using it.  Clinically remained volume overload and improvement of renal function with diuresis  Consulted nephrology. -Continue with Lasix infusion -Daily weights -strict I/O's -Low salt diet -Fluid restriction   CKD (chronic kidney disease), stage IV (HCC) Creatinine continue to improve with diuresis, 1.44 this morning.  Which makes him CKD stage IIIa. -Monitor renal function -Avoid nephrotoxins  Cellulitis Involving the right hand. -Start him on ceftriaxone -Elevate right hand  Myocardial injury Myocardial injury due to CHF exacerbation: Troponin level 32 --> 29.  Denies chest pain.  A1c of 6.3 and LDL of 74 -Continue aspirin.   COPD (chronic obstructive pulmonary disease) (HCC) Stable.  -As needed albuterol and Mucinex  Hypertension - IV hydralazine as needed -Continue Cozaar  Hyperkalemia Resolved. -Continue to monitor  Erythrocytosis Hemoglobin 16.7 -Follow-up with CBC  Thrombocytopenia ([place]) This is a chronic issue, etiology is not clear.  Platelet is stable, 111 (112 on 02/06/2022) -Follow-up with CBC   Subjective: Patient was complaining of right hand edema and some pain.  Physical Exam: Vitals:   10/07/22 0344 10/07/22 0752 10/07/22 0919 10/07/22 1221  BP: (!) 117/92 103/76  (!) 123/93  Pulse: 91 83  81  Resp: (!) '21 12  12  '$ Temp: 98.1 F (36.7 C) 97.9  F (36.6 C)  98.3 F (36.8 C)  TempSrc: Oral Oral    SpO2: 96% 99% 98% 99%  Weight:      Height:       General.  Ill-appearing elderly man, in no acute distress. Pulmonary.  Lungs clear bilaterally, normal respiratory effort. CV.  Regular rate and  rhythm, no JVD, rub or murmur. Abdomen.  Soft, nontender, nondistended, BS positive. CNS.  Alert and oriented .  No focal neurologic deficit. Extremities.  2+ LE edema bilaterally, right hand edema involving the hand and wrist, mild hyperthermia and erythema Psychiatry.  Appears to have some cognitive impairment  Data Reviewed: Prior data reviewed  Family Communication:   Disposition: Status is: Inpatient Remains inpatient appropriate because: Severity of illness   Planned Discharge Destination: Home with Home Health  DVT prophylaxis.  Lovenox Time spent: 45 minutes  This record has been created using Systems analyst. Errors have been sought and corrected,but may not always be located. Such creation errors do not reflect on the standard of care.  Author: Lorella Nimrod, MD 10/07/2022 3:13 PM  For on call review [email or web address].

## 2022-10-07 NOTE — Plan of Care (Signed)

## 2022-10-07 NOTE — Care Management Important Message (Signed)
Important Message  Patient Details  Name: John [name] MRN: [id number] Date of Birth: 10-21-1946   Medicare Important Message Given:  Yes     [name] 10/07/2022, 3:44 PM

## 2022-10-08 DIAGNOSIS — I5033 Acute on chronic diastolic (congestive) heart failure: Secondary | ICD-10-CM | POA: Diagnosis not present

## 2022-10-08 LAB — BASIC METABOLIC PANEL
Anion gap: 7 (ref 5–15)
BUN: 18 mg/dL (ref 8–23)
CO2: 42 mmol/L — ABNORMAL HIGH (ref 22–32)
Calcium: 8.8 mg/dL — ABNORMAL LOW (ref 8.9–10.3)
Chloride: 93 mmol/L — ABNORMAL LOW (ref 98–111)
Creatinine, Ser: 1.53 mg/dL — ABNORMAL HIGH (ref 0.61–1.24)
GFR, Estimated: 47 mL/min — ABNORMAL LOW (ref >60)
Glucose, Bld: 105 mg/dL — ABNORMAL HIGH (ref 70–99)
Potassium: 3.4 mmol/L — ABNORMAL LOW (ref 3.5–5.1)
Sodium: 142 mmol/L (ref 135–145)

## 2022-10-08 LAB — MAGNESIUM: Magnesium: 2.2 mg/dL (ref 1.7–2.4)

## 2022-10-08 MED ORDER — LACTULOSE 10 GM/15ML PO SOLN
10.0000 g | Freq: Once | ORAL | Status: AC
  Administered 2022-10-08: 10 g via ORAL
  Filled 2022-10-08: qty 30

## 2022-10-08 MED ORDER — POTASSIUM CHLORIDE CRYS ER 20 MEQ PO TBCR
40.0000 meq | EXTENDED_RELEASE_TABLET | Freq: Once | ORAL | Status: AC
  Administered 2022-10-08: 40 meq via ORAL
  Filled 2022-10-08: qty 2

## 2022-10-08 MED ORDER — GABAPENTIN 100 MG PO CAPS
100.0000 mg | ORAL_CAPSULE | Freq: Two times a day (BID) | ORAL | Status: DC
  Administered 2022-10-08 – 2022-10-15 (×15): 100 mg via ORAL
  Filled 2022-10-08 (×15): qty 1

## 2022-10-08 NOTE — Progress Notes (Addendum)
[name]  PROGRESS NOTE   Subjective:   Patient seen sitting up in bed, alert and oriented Breakfast tray at bedside Denies nausea and vomiting States he is feels better today, breathing has improved Remains on 2 L nasal cannula, room air at baseline Right hand edema slightly improved  Objective:  Vital signs: Blood pressure 104/75, pulse 88, temperature 98.8 F (37.1 C), temperature source Oral, resp. rate 20, height '5\' 10"'$  (1.778 m), weight 98.4 kg, SpO2 95 %.  Intake/Output Summary (Last 24 hours) at 10/08/2022 1124 Last data filed at 10/08/2022 1032 Gross per 24 hour  Intake 475.13 ml  Output 3720 ml  Net -3244.87 ml    Filed Weights   10/04/22 0420 10/05/22 0355 10/08/22 0432  Weight: 99.7 kg 99.8 kg 98.4 kg     Physical Exam: General:  No acute distress  Head:  Normocephalic, atraumatic. Moist oral mucosal membranes  Eyes:  Anicteric  Lungs:   Clear to auscultation, normal effort  Heart:  S1S2 no rubs  Abdomen:   Soft, nontender, bowel sounds present  Extremities: 2+ peripheral edema.  Neurologic:  Awake, alert, following commands  Skin:  No lesions  Access: None    Basic Metabolic Panel: Recent Labs  Lab 10/04/22 0851 10/05/22 0536 10/06/22 0527 10/07/22 0659 10/08/22 0514  NA 142 142 144 142 142  K 4.8 3.6 3.4* 3.4* 3.4*  CL 99 98 98 94* 93*  CO2 36* 38* 39* 39* 42*  GLUCOSE 101* 107* 113* 112* 105*  BUN 25* '21 19 16 18  '$ CREATININE 1.78* 1.72* 1.50* 1.44* 1.53*  CALCIUM 8.5* 8.4* 8.4* 8.8* 8.8*  MG 2.2 2.2 2.1 2.0 2.2     CBC: Recent Labs  Lab 10/02/22 0858 10/03/22 1315 10/06/22 0527  WBC 8.1 8.3 7.7  NEUTROABS 6.1  --   --   HGB 16.7 16.9 15.8  HCT 54.2* 54.9* 52.5*  MCV 104.4* 106.0* 105.4*  PLT 117* 98* 81*      Urinalysis: No results for input(s): "COLORURINE", "LABSPEC", "PHURINE", "GLUCOSEU", "HGBUR", "BILIRUBINUR", "KETONESUR", "PROTEINUR", "UROBILINOGEN", "NITRITE", "LEUKOCYTESUR" in the last 72  hours.  Invalid input(s): "APPERANCEUR"    Imaging: No results found.   Medications:    cefTRIAXone (ROCEPHIN)  IV 1 g (10/07/22 1349)   furosemide (LASIX) 200 mg in dextrose 5 % 100 mL (2 mg/mL) infusion 8 mg/hr (10/08/22 0428)    aspirin EC  81 mg Oral Daily   enoxaparin (LOVENOX) injection  40 mg Subcutaneous Q24H   gabapentin  100 mg Oral BID   losartan  50 mg Oral Daily   pantoprazole  20 mg Oral Daily    Assessment/ Plan:     Principal Problem:   Acute on chronic diastolic CHF (congestive heart failure) (HCC) Active Problems:   COPD (chronic obstructive pulmonary disease) (HCC)   Hypertension   Erythrocytosis   Thrombocytopenia (HCC)   CKD (chronic kidney disease), stage IV ([place])   Myocardial injury   Hyperkalemia   Cellulitis  John [name] is a 76 year old male with past medical conditions including COPD, hyperlipidemia, hypertension, CHF, and chronic kidney disease stage IV.  Patient presents to the emergency room at the advice of nephrologist for evaluation of progressive lower extremity edema.  Patient has been admitted for Acute on chronic diastolic congestive heart failure    #1: Chronic kidney disease with chronic kidney disease stage IV: CKD which is most likely secondary to chronic hypertensive nephrosclerosis versus ischemic renal disease due to decreased cardiac output.  Renal  function remains stable on furosemide drip.   #2: Congestive heart failure: ECHO from 10/02/22 shows EF 50-55% with mild LVH and a grade 1 diastolic dysfunction. Patient is advised on importance of strict fluid restriction.  Furosemide drip remains in place.  Due to decreased urine output, patient given metolazone 5 mg yesterday.  Urine output recorded at 3.1 L in preceding 24 hours.  Recorded weight decreased to 1.5 kg.   #3: Hypertension with chronic kidney disease: We will continue the losartan for long-term cardiorenal protection.  Blood pressure 104/75 this morning.   #4:  Second hyperparathyroidism: We will resume calcitriol on discharge. Calcium stable  #5: Hypokalemia: Continue oral potassium 40 mEq daily.    LOS: [facility] 10/24/202311:24 AM

## 2022-10-08 NOTE — Progress Notes (Signed)
[name]  PROGRESS NOTE   Subjective:   Patient seen sitting up in bed, alert and oriented Breakfast tray at bedside Denies nausea and vomiting States he is feels better today, breathing has improved Remains on 2 L nasal cannula, room air at baseline Complains of constipation  Right hand edema slightly improved  Objective:  Vital signs: Blood pressure (!) 83/59, pulse 97, temperature 98.2 F (36.8 C), temperature source Oral, resp. rate 18, height '5\' 10"'$  (1.778 m), weight 98.4 kg, SpO2 90 %.  Intake/Output Summary (Last 24 hours) at 10/08/2022 1504 Last data filed at 10/08/2022 1032 Gross per 24 hour  Intake 360 ml  Output 3220 ml  Net -2860 ml    Filed Weights   10/04/22 0420 10/05/22 0355 10/08/22 0432  Weight: 99.7 kg 99.8 kg 98.4 kg     Physical Exam: General:  No acute distress  Head:  Normocephalic, atraumatic. Moist oral mucosal membranes  Eyes:  Anicteric  Lungs:   Clear to auscultation, normal effort  Heart:  S1S2 no rubs  Abdomen:   Soft, nontender, bowel sounds present  Extremities: 2+ peripheral edema.  Neurologic:  Awake, alert, following commands  Skin:  No lesions  Access: None    Basic Metabolic Panel: Recent Labs  Lab 10/04/22 0851 10/05/22 0536 10/06/22 0527 10/07/22 0659 10/08/22 0514  NA 142 142 144 142 142  K 4.8 3.6 3.4* 3.4* 3.4*  CL 99 98 98 94* 93*  CO2 36* 38* 39* 39* 42*  GLUCOSE 101* 107* 113* 112* 105*  BUN 25* '21 19 16 18  '$ CREATININE 1.78* 1.72* 1.50* 1.44* 1.53*  CALCIUM 8.5* 8.4* 8.4* 8.8* 8.8*  MG 2.2 2.2 2.1 2.0 2.2     CBC: Recent Labs  Lab 10/02/22 0858 10/03/22 1315 10/06/22 0527  WBC 8.1 8.3 7.7  NEUTROABS 6.1  --   --   HGB 16.7 16.9 15.8  HCT 54.2* 54.9* 52.5*  MCV 104.4* 106.0* 105.4*  PLT 117* 98* 81*      Urinalysis: No results for input(s): "COLORURINE", "LABSPEC", "PHURINE", "GLUCOSEU", "HGBUR", "BILIRUBINUR", "KETONESUR", "PROTEINUR", "UROBILINOGEN", "NITRITE",  "LEUKOCYTESUR" in the last 72 hours.  Invalid input(s): "APPERANCEUR"    Imaging: No results found.   Medications:    cefTRIAXone (ROCEPHIN)  IV 1 g (10/08/22 1249)   furosemide (LASIX) 200 mg in dextrose 5 % 100 mL (2 mg/mL) infusion 8 mg/hr (10/08/22 0428)    aspirin EC  81 mg Oral Daily   enoxaparin (LOVENOX) injection  40 mg Subcutaneous Q24H   gabapentin  100 mg Oral BID   losartan  50 mg Oral Daily   pantoprazole  20 mg Oral Daily    Assessment/ Plan:     Principal Problem:   Acute on chronic diastolic CHF (congestive heart failure) (HCC) Active Problems:   COPD (chronic obstructive pulmonary disease) (HCC)   Hypertension   Erythrocytosis   Thrombocytopenia (HCC)   CKD (chronic kidney disease), stage IV ([place])   Myocardial injury   Hyperkalemia   Cellulitis  John [name] is a 76 year old male with past medical conditions including COPD, hyperlipidemia, hypertension, CHF, and chronic kidney disease stage IV.  Patient presents to the emergency room at the advice of nephrologist for evaluation of progressive lower extremity edema.  Patient has been admitted for Acute on chronic diastolic congestive heart failure    #1: Chronic kidney disease with chronic kidney disease stage IV: CKD which is most likely secondary to chronic hypertensive nephrosclerosis versus ischemic renal disease due to  decreased cardiac output.  Renal function remains stable on furosemide drip.   #2: Congestive heart failure: ECHO from 10/02/22 shows EF 50-55% with mild LVH and a grade 1 diastolic dysfunction. Patient is advised on importance of strict fluid restriction.  Furosemide drip remains in place. Will continue to 24 hours and transition to oral medications tomorrow.  Due to decreased urine output, patient given metolazone 5 mg yesterday.  Urine output recorded at 3.1 L in preceding 24 hours.  Recorded weight decreased to 1.5 kg.   #3: Hypertension with chronic kidney disease: We will continue  the losartan for long-term cardiorenal protection.  Blood pressure 104/75 this morning.   #4: Second hyperparathyroidism: We will resume calcitriol on discharge. Calcium stable  #5: Hypokalemia: Continue oral potassium 40 mEq daily.  6. Constipation, reports no bowel movement since admission. Will order Lactulose 10g once.     LOS: [facility] 10/24/20233:04 PM

## 2022-10-08 NOTE — Progress Notes (Signed)
    Heart Failure Nurse Navigator Note  By teach back method, discussed the portance of daily weights, what to report, fluid restriction and sodium restriction.  Needed very little reinforcement as he could tell me to report 2 pound weight gain overnight or total of 5 pounds within the week no more than 64 ounces.  We will continue to follow along.  Pricilla Riffle RN [facility]

## 2022-10-08 NOTE — TOC Progression Note (Signed)
Transition of Care [facility]) - Progression Note    Patient Details  Name: John [name] MRN: [id number] Date of Birth: [birth year]  Transition of Care [facility]) CM/SW [place], [place] Phone Number: 10/08/2022, 11:45 AM  Clinical Narrative:     CSW met with patient at bedside to inform of new recommendation for SNF, patient reports he continues to want ot go home with [facility] with [facility] and feels he can do all his therapy at home as previously planned. Reports he doe snot typically wear O2 at home, is currently on 5L. TOC will continue to follow for discharge planning needs.   DC plan right now remains home with [facility] at dc.    Expected Discharge Plan: [place] Barriers to Discharge: Continued Medical Work up  Expected Discharge Plan and Services Expected Discharge Plan: [place] In-house Referral: Clinical Social Work   Post Acute Care Choice: [place] arrangements for the past 2 months: [place]: RN, PT, OT HH Agency: [place] Date [place]: 10/06/22 Time [place]: 1508 Representative spoke with at [place]: cory   Social Determinants of Health ([place]) Interventions    Readmission Risk Interventions     No data to display

## 2022-10-08 NOTE — Progress Notes (Signed)
Progress Note   Patient: John [name] VFI:[id number] DOB: [birth year] DOA: 10/02/2022     6 DOS: the patient was seen and examined on 10/08/2022   Brief hospital course: Taken from prior notes.  76-year-old with history of diastolic CHF, CKD stage IV, HTN, HLD, COPD, erythrocytosis, cor pulmonale admitted for lower extremity edema and weight gain.  Minimal shortness of breath but requiring 2 L nasal cannula in the ED due to hypoxia.  Patient was seen by his nephrologist Dr. Zollie Scale outpatient and was advised to come to the hospital for IV diuretics.  Chest x-ray showed cardiomegaly, nephrology team was consulted.  Creatinine slowly improving with IV diuretics.  10/21: Hemodynamically stable.  Renal function continued to improve, creatinine at 1.72 with improvement of GFR to 41.  Weight seems stable at 220, recorded output of 2600 with net negative of -5.2 L.  Remained on Lasix infusion. Echocardiogram during current hospitalization with normal EF, grade 1 diastolic dysfunction and signs of right ventricular overload.   10/22: Patient continued to have good diuresis with Lasix infusion.  Renal function continues to improve, creatinine at 1.50 today.  Still with significant volume overload.  Continue to require 4 to 5 L of oxygen, patient has home oxygen but was not using it.  Not sure about baseline.  10/23: Patient was continued on Lasix infusion, renal function continue to improve now creatinine of 1.44 and GFR of 50 which makes him CKD stage IIIa.  Stable bicarb at 39.  Remained on 5 L of oxygen but saturating 99 to 100%-we will try weaning to the minimum requirement, most likely will be going home with oxygen.  Doubt he does not need oxygen but was discharged with oxygen during prior hospitalization.  Most likely noncompliant and need education. PT and OT are now recommending SNF. Developed right hand edema, mild erythema and hyperthermia-start him on ceftriaxone for concern of  cellulitis.  10/24: Patient with slight worsening of bicarb and creatinine at 1.53.  Net negative of more than 11 L. Significant improvement in lower extremity edema.  PT/OT are recommending SNF but patient declined and wants to go home with home health services.  Discussed with nephrology and they would like to continue Lasix infusion for another 1 to 2 days before switching him to p.o. Right hand edema and pain improving. He was complaining of burning pain involving both feet, started on Neurontin-dose will need titration.   Assessment and Plan: * Acute on chronic diastolic CHF (congestive heart failure) ([place]) 2D echo on 01/29/2022 showed EF 55 to 60%.  Patient has leg edema, positive JVD, crackles on auscultation, elevated BNP 1103.  Patient denies shortness breath, has new oxygen requirement, not sure about baseline, as he has home oxygen but was not using it.  Clinically remained volume overload and improvement of renal function with diuresis  Consulted nephrology. -Continue with Lasix infusion -Daily weights -strict I/O's -Low salt diet -Fluid restriction   CKD (chronic kidney disease), stage IV (HCC) Creatinine continue to improve with diuresis, 1.44 this morning.  Which makes him CKD stage IIIa. -Monitor renal function -Avoid nephrotoxins  Cellulitis Involving the right hand, improving -Continue with ceftriaxone while in the hospital-can be discharged on Doxy to complete a 7-day course. -Elevate right hand  Myocardial injury Myocardial injury due to CHF exacerbation: Troponin level 32 --> 29.  Denies chest pain.  A1c of 6.3 and LDL of 74 -Continue aspirin.   COPD (chronic obstructive pulmonary disease) (HCC) Stable.  -As needed albuterol and Mucinex  Hypertension - IV hydralazine as needed -Continue Cozaar  Hyperkalemia Resolved. -Continue to monitor  Erythrocytosis Hemoglobin 16.7 -Follow-up with CBC  Thrombocytopenia ([place]) This is a chronic issue, etiology  is not clear.  Platelet is stable, 111 (112 on 02/06/2022) -Follow-up with CBC   Subjective: Patient was seen and examined today.  He was complaining of burning pain in both feet.  He also declined going to SNF stating that he will go home with home health services but not to rehab.  Physical Exam: Vitals:   10/08/22 0430 10/08/22 0432 10/08/22 0741 10/08/22 1144  BP: 104/74  104/75 (!) 83/59  Pulse: 96  88 97  Resp: '16  20 18  '$ Temp: 97.7 F (36.5 C)  98.8 F (37.1 C) 98.2 F (36.8 C)  TempSrc:   Oral Oral  SpO2: 94%  95% 90%  Weight:  98.4 kg    Height:       General.  Elderly man, in no acute distress. Pulmonary.  Lungs clear bilaterally, normal respiratory effort. CV.  Regular rate and rhythm, no JVD, rub or murmur. Abdomen.  Soft, nontender, nondistended, BS positive. CNS.  Alert and oriented .  No focal neurologic deficit. Extremities.  1+ LE edema, no cyanosis, pulses intact and symmetrical.  Right hand edema and erythema with significant improvement. Psychiatry.  Judgment and insight appears normal.   Data Reviewed: Prior data reviewed  Family Communication:   Disposition: Status is: Inpatient Remains inpatient appropriate because: Severity of illness   Planned Discharge Destination: Home with Home Health  DVT prophylaxis.  Lovenox Time spent: 44 minutes  This record has been created using Systems analyst. Errors have been sought and corrected,but may not always be located. Such creation errors do not reflect on the standard of care.  Author: Lorella Nimrod, MD 10/08/2022 2:50 PM  For on call review [email or web address].

## 2022-10-09 ENCOUNTER — Ambulatory Visit: Payer: Medicare HMO | Admitting: Family

## 2022-10-09 DIAGNOSIS — I5033 Acute on chronic diastolic (congestive) heart failure: Secondary | ICD-10-CM | POA: Diagnosis not present

## 2022-10-09 LAB — CBC
HCT: 51.9 % (ref 39.0–52.0)
Hemoglobin: 15.7 g/dL (ref 13.0–17.0)
MCH: 32 pg (ref 26.0–34.0)
MCHC: 30.3 g/dL (ref 30.0–36.0)
MCV: 105.7 fL — ABNORMAL HIGH (ref 80.0–100.0)
Platelets: 91 10*3/uL — ABNORMAL LOW (ref 150–400)
RBC: 4.91 MIL/uL (ref 4.22–5.81)
RDW: 13.5 % (ref 11.5–15.5)
WBC: 6.2 10*3/uL (ref 4.0–10.5)
nRBC: 0 % (ref 0.0–0.2)

## 2022-10-09 LAB — BASIC METABOLIC PANEL
Anion gap: 9 (ref 5–15)
BUN: 24 mg/dL — ABNORMAL HIGH (ref 8–23)
CO2: 42 mmol/L — ABNORMAL HIGH (ref 22–32)
Calcium: 8.8 mg/dL — ABNORMAL LOW (ref 8.9–10.3)
Chloride: 90 mmol/L — ABNORMAL LOW (ref 98–111)
Creatinine, Ser: 1.74 mg/dL — ABNORMAL HIGH (ref 0.61–1.24)
GFR, Estimated: 40 mL/min — ABNORMAL LOW (ref >60)
Glucose, Bld: 107 mg/dL — ABNORMAL HIGH (ref 70–99)
Potassium: 3.3 mmol/L — ABNORMAL LOW (ref 3.5–5.1)
Sodium: 141 mmol/L (ref 135–145)

## 2022-10-09 LAB — MAGNESIUM: Magnesium: 2.1 mg/dL (ref 1.7–2.4)

## 2022-10-09 MED ORDER — POTASSIUM CHLORIDE CRYS ER 20 MEQ PO TBCR
40.0000 meq | EXTENDED_RELEASE_TABLET | Freq: Two times a day (BID) | ORAL | Status: AC
  Administered 2022-10-09 (×2): 40 meq via ORAL
  Filled 2022-10-09 (×2): qty 2

## 2022-10-09 MED ORDER — POLYETHYLENE GLYCOL 3350 17 G PO PACK
17.0000 g | PACK | Freq: Two times a day (BID) | ORAL | Status: DC | PRN
  Administered 2022-10-09: 17 g via ORAL

## 2022-10-09 NOTE — Progress Notes (Signed)
Occupational Therapy Treatment Patient Details Name: John [name] MRN: [id number] DOB: Oct 23, 1946 Today's Date: 10/09/2022   History of present illness Pt is a 76 year old with history of diastolic CHF, CKD stage IV, HTN, HLD, COPD, erythrocytosis, cor pulmonale admitted for lower extremity edema and weight gain.  Minimal shortness of breath but requiring 2 L nasal cannula in the ED due to hypoxia.  Patient was seen by his nephrologist Dr. Zollie Scale outpatient and was advised to come to the hospital for IV diuretics.   OT comments  Upon entering session, pt sitting in recliner with wife present. Pt on 1.5L O2 via [place] throughout. Pt deferring grooming and toileting tasks, however, agreeable to ambulate in the hallway. Pt donned new gown in sitting. He required increased time and physical assistance to stand from recliner. He then completed functional mobility for ~40 ft in the hallway with Min guard using RW. Pt c/o pain in R foot after activity ("I think it's plantar fasciitis"), RN aware. Pt left as received with all needs in reach. Pt is making progress toward goal completion. D/C recommendation remains appropriate. OT will continue to follow acutely.      Recommendations for follow up therapy are one component of a multi-disciplinary discharge planning process, led by the attending physician.  Recommendations may be updated based on patient status, additional functional criteria and insurance authorization.    Follow Up Recommendations  Skilled nursing-short term rehab (<3 hours/day)    Assistance Recommended at Discharge Frequent or constant Supervision/Assistance  Patient can return home with the following  A lot of help with walking and/or transfers;A lot of help with bathing/dressing/bathroom;Assistance with cooking/housework;Assist for transportation;Help with stairs or ramp for entrance   Equipment Recommendations  Other (comment) (defer to next venue)    Recommendations for Other  Services      Precautions / Restrictions Precautions Precautions: Fall Restrictions Weight Bearing Restrictions: No       Mobility Bed Mobility               General bed mobility comments: NT, pt received/lleft in recliner    Transfers Overall transfer level: Needs assistance Equipment used: Rolling walker (2 wheels) Transfers: Sit to/from Stand Sit to Stand: Mod assist           General transfer comment: STS from recliner     Balance Overall balance assessment: Needs assistance Sitting-balance support: Feet supported Sitting balance-Leahy Scale: Fair     Standing balance support: Bilateral upper extremity supported, During functional activity, Reliant on assistive device for balance Standing balance-Leahy Scale: Fair                             ADL either performed or assessed with clinical judgement   ADL Overall ADL's : Needs assistance/impaired                 Upper Body Dressing : Set up;Supervision/safety;Sitting Upper Body Dressing Details (indicate cue type and reason): to don gown                 Functional mobility during ADLs: Min guard;Rolling walker (2 wheels) (to ambulate ~40 ft in hallway)      Extremity/Trunk Assessment Upper Extremity Assessment Upper Extremity Assessment: Generalized weakness   Lower Extremity Assessment Lower Extremity Assessment: Generalized weakness        Vision       Perception     Praxis      Cognition Arousal/Alertness: Awake/alert  Behavior During Therapy: Flat affect Overall Cognitive Status: Within Functional Limits for tasks assessed                                          Exercises      Shoulder Instructions       General Comments      Pertinent Vitals/ Pain       Pain Assessment Pain Assessment: 0-10 Pain Score: 7  Pain Location: R foot ("I think I have plantar fasciitis") Pain Descriptors / Indicators: Aching, Grimacing, Guarding Pain  Intervention(s): Limited activity within patient's tolerance, Monitored during session, Repositioned  Home Living                                          Prior Functioning/Environment              Frequency  Min 2X/week        Progress Toward Goals  OT Goals(current goals can now be found in the care plan section)  Progress towards OT goals: Progressing toward goals  Acute Rehab OT Goals Patient Stated Goal: get better and go home OT Goal Formulation: With patient Time For Goal Achievement: 10/17/22 Potential to Achieve Goals: Good  Plan Frequency remains appropriate;Discharge plan remains appropriate    Co-evaluation                 AM-PAC OT "6 Clicks" Daily Activity     Outcome Measure   Help from another person eating meals?: None Help from another person taking care of personal grooming?: None Help from another person toileting, which includes using toliet, bedpan, or urinal?: A Lot Help from another person bathing (including washing, rinsing, drying)?: A Little Help from another person to put on and taking off regular upper body clothing?: None Help from another person to put on and taking off regular lower body clothing?: A Little 6 Click Score: 20    End of Session Equipment Utilized During Treatment: Gait belt;Oxygen;Rolling walker (2 wheels)  OT Visit Diagnosis: Other abnormalities of gait and mobility (R26.89);Muscle weakness (generalized) (M62.81)   Activity Tolerance Patient tolerated treatment well   Patient Left in chair;with call bell/phone within reach;with family/visitor present   Nurse Communication Mobility status        Time: [id number] OT Time Calculation (min): 20 min  Charges: OT General Charges $OT Visit: 1 Visit OT Treatments $Self Care/Home Management : 8-22 mins  [facility] MS, OTR/L ascom [phone]  10/09/22, 6:11 PM

## 2022-10-09 NOTE — Assessment & Plan Note (Signed)
Resolved.  Continue to monitor.

## 2022-10-09 NOTE — Progress Notes (Signed)
Progress Note   Patient: John [name] ONG:[id number] DOB: 1946-03-24 DOA: 10/02/2022     7 DOS: the patient was seen and examined on 10/09/2022   Brief hospital course: Taken from prior notes.  76-year-old with history of diastolic CHF, CKD stage IV, HTN, HLD, COPD, erythrocytosis, cor pulmonale admitted for lower extremity edema and weight gain.  Minimal shortness of breath but requiring 2 L nasal cannula in the ED due to hypoxia.  Patient was seen by his nephrologist Dr. Zollie Scale outpatient and was advised to come to the hospital for IV diuretics.  Chest x-ray showed cardiomegaly, nephrology team was consulted.  Creatinine slowly improving with IV diuretics.  10/21: Hemodynamically stable.  Renal function continued to improve, creatinine at 1.72 with improvement of GFR to 41.  Weight seems stable at 220, recorded output of 2600 with net negative of -5.2 L.  Remained on Lasix infusion. Echocardiogram during current hospitalization with normal EF, grade 1 diastolic dysfunction and signs of right ventricular overload.   10/22: Patient continued to have good diuresis with Lasix infusion.  Renal function continues to improve, creatinine at 1.50 today.  Still with significant volume overload.  Continue to require 4 to 5 L of oxygen, patient has home oxygen but was not using it.  Not sure about baseline.  10/23: Patient was continued on Lasix infusion, renal function continue to improve now creatinine of 1.44 and GFR of 50 which makes him CKD stage IIIa.  Stable bicarb at 39.  Remained on 5 L of oxygen but saturating 99 to 100%-we will try weaning to the minimum requirement, most likely will be going home with oxygen.  Doubt he does not need oxygen but was discharged with oxygen during prior hospitalization.  Most likely noncompliant and need education. PT and OT are now recommending SNF. Developed right hand edema, mild erythema and hyperthermia-start him on ceftriaxone for concern of  cellulitis.  10/24: Patient with slight worsening of bicarb and creatinine at 1.53.  Net negative of more than 11 L. Significant improvement in lower extremity edema.  PT/OT are recommending SNF but patient declined and wants to go home with home health services.  Discussed with nephrology and they would like to continue Lasix infusion for another 1 to 2 days before switching him to p.o. Right hand edema and pain improving. He was complaining of burning pain involving both feet, started on Neurontin-dose will need titration.  10/25: Lasix infusion was discontinued due to worsening renal function.  He will be started on torsemide 20 mg daily from tomorrow.  Family and patient now wants to proceed with SNF. TOC is aware.   Assessment and Plan: * Acute on chronic diastolic CHF (congestive heart failure) ([place]) 2D echo on 01/29/2022 showed EF 55 to 60%.  Patient has leg edema, positive JVD, crackles on auscultation, elevated BNP 1103.  Patient denies shortness breath, has new oxygen requirement, not sure about baseline, as he has home oxygen but was not using it.  Volume status improving, worsening creatinine so Lasix infusion was discontinued.  Nephrology is on board  -Transitioning to torsemide 20 mg daily from tomorrow -Daily weights -strict I/O's -Low salt diet -Fluid restriction   CKD (chronic kidney disease), stage IV (HCC) Creatinine with some worsening to 1.74, Lasix infusion was discontinued -Monitor renal function -Avoid nephrotoxins  Cellulitis Involving the right hand, improving -Continue with ceftriaxone while in the hospital-can be discharged on Doxy to complete a 7-day course.   Myocardial injury Myocardial injury due to CHF exacerbation:  Troponin level 32 --> 29.  Denies chest pain.  A1c of 6.3 and LDL of 74 -Continue aspirin.   COPD (chronic obstructive pulmonary disease) (HCC) Stable.  -As needed albuterol and Mucinex  Hypertension - IV hydralazine as  needed -Continue Cozaar  Hyperkalemia Resolved. -Continue to monitor  Erythrocytosis Hemoglobin 16.7 -Follow-up with CBC  Thrombocytopenia ([place]) This is a chronic issue, etiology is not clear.  Platelet is stable, 111 (112 on 02/06/2022) -Follow-up with CBC   Subjective: Patient was working with PT when seen today.  He now wants to go to SNF as recommended by our PT and his family wants him to pursue that option.  Physical Exam: Vitals:   10/09/22 0500 10/09/22 0825 10/09/22 1210 10/09/22 1533  BP:  96/72 (!) 95/59 (!) 83/52  Pulse:  80 88 83  Resp:  '16 18 16  '$ Temp:  97.6 F (36.4 C) 98 F (36.7 C) 97.7 F (36.5 C)  TempSrc:  Oral Oral Oral  SpO2:  100%  94%  Weight: 98.2 kg     Height:       General.  Overweight elderly man, in no acute distress. Pulmonary.  Lungs clear bilaterally, normal respiratory effort. CV.  Regular rate and rhythm, no JVD, rub or murmur. Abdomen.  Soft, nontender, nondistended, BS positive. CNS.  Alert and oriented .  No focal neurologic deficit. Extremities.  Trace LE edema, no cyanosis, pulses intact and symmetrical. Psychiatry.  Judgment and insight appears normal.   Data Reviewed: Prior data reviewed  Family Communication:   Disposition: Status is: Inpatient Remains inpatient appropriate because: Severity of illness   Planned Discharge Destination: Home with Home Health  DVT prophylaxis.  Lovenox Time spent: 40 minutes  This record has been created using Systems analyst. Errors have been sought and corrected,but may not always be located. Such creation errors do not reflect on the standard of care.  Author: Lorella Nimrod, MD 10/09/2022 3:44 PM  For on call review [email or web address].

## 2022-10-09 NOTE — Progress Notes (Signed)
Physical Therapy Treatment Patient Details Name: John [name] MRN: [id number] DOB: August 17, 1946 Today's Date: 10/09/2022   History of Present Illness Pt is a 76 year old with history of diastolic CHF, CKD stage IV, HTN, HLD, COPD, erythrocytosis, cor pulmonale admitted for lower extremity edema and weight gain.  Minimal shortness of breath but requiring 2 L nasal cannula in the ED due to hypoxia.  Patient was seen by his nephrologist Dr. Zollie Scale outpatient and was advised to come to the hospital for IV diuretics.    PT Comments    Pt was long sitting in bed on 2L o2 upon arriving. He presents with flat affect however was cooperative throughout. RN came into room and discontinued telemetry as ordered. At baseline, pt ambulates without AD and is able to ambulate community distances. " I told the lady (SW) yesterday  that I wanted to go home but after talking to my family, I want rehab first." Pt will greatly benefit from rehab at DC to maximize his safety with all ADLs while assisting him to PLOF. Pt is severely deconditioned and c/o bilateral heel pain. " I think it's gout." He was able to ambulate into hallway (~46f) with slow, flexed, gait kinematics. Requires more assistance to perform bed mobility and to stand. See amount of assist listed below. Overall tolerated session well. He wa sin recliner at conclusion of session with all needs in reach.   Recommendations for follow up therapy are one component of a multi-disciplinary discharge planning process, led by the attending physician.  Recommendations may be updated based on patient status, additional functional criteria and insurance authorization.  Follow Up Recommendations  Skilled nursing-short term rehab (<3 hours/day)     Assistance Recommended at Discharge Frequent or constant Supervision/Assistance  Patient can return home with the following A little help with walking and/or transfers;A little help with  bathing/dressing/bathroom;Assistance with cooking/housework;Direct supervision/assist for medications management;Direct supervision/assist for financial management;Assist for transportation;Help with stairs or ramp for entrance   Equipment Recommendations  Other (comment) (defer to next level of care. Pt does not use AD at baseline. If he DCs home form hospital, will need RW)       Precautions / Restrictions Precautions Precautions: Fall Restrictions Weight Bearing Restrictions: No     Mobility  Bed Mobility Overal bed mobility: Needs Assistance Bed Mobility: Supine to Sit     Supine to sit: Min assist, HOB elevated     General bed mobility comments: increased time + min assist.    Transfers Overall transfer level: Needs assistance Equipment used: Rolling walker (2 wheels) Transfers: Sit to/from Stand Sit to Stand: Min assist, Mod assist    General transfer comment: Min assist to stand from elevated bed height, mod assist to stand from lowest bed height. VCs for handplacement and technique improvements    Ambulation/Gait Ambulation/Gait assistance: Min guard Gait Distance (Feet): 40 Feet Assistive device: Rolling walker (2 wheels) Gait Pattern/deviations: Step-through pattern, Trunk flexed       General Gait Details: Pt was able to ambulate 40 ft with RW + author pushing O2 tank. He reports he did not use RW at baseline. Sao2 stable throughout session with pt on O2. poor pleth reading on moniot throughout    Balance Overall balance assessment: Needs assistance Sitting-balance support: Feet supported Sitting balance-Leahy Scale: Fair     Standing balance support: Bilateral upper extremity supported, During functional activity, Reliant on assistive device for balance Standing balance-Leahy Scale: Fair Standing balance comment: fair with BUE support however poor without  UE support      Cognition Arousal/Alertness: Awake/alert Behavior During Therapy: Flat  affect Overall Cognitive Status: Within Functional Limits for tasks assessed      General Comments: Pt present with flat affect however was cooperative and motivated. He reports he told SW two days prior that he wanted to go home however requesting to go to rehab now."               Pertinent Vitals/Pain Pain Assessment Pain Assessment: 0-10 Pain Score: 4  Pain Location: bilateral heels (" I might have gout") Pain Descriptors / Indicators: Aching, Grimacing, Guarding Pain Intervention(s): Limited activity within patient's tolerance, Monitored during session, Premedicated before session, Repositioned     PT Goals (current goals can now be found in the care plan section) Acute Rehab PT Goals Patient Stated Goal: Rehab then home Progress towards PT goals: Progressing toward goals    Frequency    Min 2X/week      PT Plan Current plan remains appropriate    Co-evaluation     PT goals addressed during session: Mobility/safety with mobility;Balance;Proper use of DME;Strengthening/ROM        AM-PAC PT "6 Clicks" Mobility   Outcome Measure  Help needed turning from your back to your side while in a flat bed without using bedrails?: A Lot Help needed moving from lying on your back to sitting on the side of a flat bed without using bedrails?: A Lot Help needed moving to and from a bed to a chair (including a wheelchair)?: A Little Help needed standing up from a chair using your arms (e.g., wheelchair or bedside chair)?: A Little Help needed to walk in hospital room?: A Little Help needed climbing 3-5 steps with a railing? : A Lot 6 Click Score: 15    End of Session Equipment Utilized During Treatment: Gait belt;Oxygen Activity Tolerance: Patient tolerated treatment well Patient left: in chair;with call bell/phone within reach;with family/visitor present;with nursing/sitter in room Nurse Communication: Mobility status PT Visit Diagnosis: Other abnormalities of gait and  mobility (R26.89);Difficulty in walking, not elsewhere classified (R26.2);Muscle weakness (generalized) (M62.81)     Time: 9233-0076 PT Time Calculation (min) (ACUTE ONLY): 14 min  Charges:  $Gait Training: 8-22 mins                     Julaine Fusi PTA 10/09/22, 11:21 AM

## 2022-10-09 NOTE — Progress Notes (Signed)
[name]  PROGRESS NOTE   Subjective:   Patient seen resting quietly in bed, completed breakfast tray at bedside Patient currently requesting to get out of bed to sit in chair Patient states he feels well today, no shortness of breath Appetite remains appropriate Lower extremity edema remains however improved.  Objective:  Vital signs: Blood pressure (!) 95/59, pulse 88, temperature 98 F (36.7 C), temperature source Oral, resp. rate 18, height '5\' 10"'$  (1.778 m), weight 98.2 kg, SpO2 100 %.  Intake/Output Summary (Last 24 hours) at 10/09/2022 1435 Last data filed at 10/09/2022 1422 Gross per 24 hour  Intake 1200 ml  Output 1700 ml  Net -500 ml    Filed Weights   10/05/22 0355 10/08/22 0432 10/09/22 0500  Weight: 99.8 kg 98.4 kg 98.2 kg     Physical Exam: General:  No acute distress  Head:  Normocephalic, atraumatic. Moist oral mucosal membranes  Eyes:  Anicteric  Lungs:   Clear to auscultation, normal effort  Heart:  S1S2 no rubs  Abdomen:   Soft, nontender, bowel sounds present  Extremities: 1+ peripheral edema.  Neurologic:  Awake, alert, following commands  Skin:  No lesions  Access: None    Basic Metabolic Panel: Recent Labs  Lab 10/05/22 0536 10/06/22 0527 10/07/22 0659 10/08/22 0514 10/09/22 0451  NA 142 144 142 142 141  K 3.6 3.4* 3.4* 3.4* 3.3*  CL 98 98 94* 93* 90*  CO2 38* 39* 39* 42* 42*  GLUCOSE 107* 113* 112* 105* 107*  BUN '21 19 16 18 '$ 24*  CREATININE 1.72* 1.50* 1.44* 1.53* 1.74*  CALCIUM 8.4* 8.4* 8.8* 8.8* 8.8*  MG 2.2 2.1 2.0 2.2 2.1     CBC: Recent Labs  Lab 10/03/22 1315 10/06/22 0527 10/09/22 0451  WBC 8.3 7.7 6.2  HGB 16.9 15.8 15.7  HCT 54.9* 52.5* 51.9  MCV 106.0* 105.4* 105.7*  PLT 98* 81* 91*      Urinalysis: No results for input(s): "COLORURINE", "LABSPEC", "PHURINE", "GLUCOSEU", "HGBUR", "BILIRUBINUR", "KETONESUR", "PROTEINUR", "UROBILINOGEN", "NITRITE", "LEUKOCYTESUR" in the last 72  hours.  Invalid input(s): "APPERANCEUR"    Imaging: No results found.   Medications:    cefTRIAXone (ROCEPHIN)  IV 1 g (10/09/22 1228)    aspirin EC  81 mg Oral Daily   enoxaparin (LOVENOX) injection  40 mg Subcutaneous Q24H   gabapentin  100 mg Oral BID   pantoprazole  20 mg Oral Daily   potassium chloride  40 mEq Oral BID    Assessment/ Plan:     Principal Problem:   Acute on chronic diastolic CHF (congestive heart failure) (HCC) Active Problems:   COPD (chronic obstructive pulmonary disease) (HCC)   Hypertension   Erythrocytosis   Thrombocytopenia (HCC)   CKD (chronic kidney disease), stage IV (HCC)   Myocardial injury   Hyperkalemia   Cellulitis  John [name] is a 76 year old male with past medical conditions including COPD, hyperlipidemia, hypertension, CHF, and chronic kidney disease stage IV.  Patient presents to the emergency room at the advice of nephrologist for evaluation of progressive lower extremity edema.  Patient has been admitted for Acute on chronic diastolic congestive heart failure    #1: Chronic kidney disease with chronic kidney disease stage IV: CKD which is most likely secondary to chronic hypertensive nephrosclerosis versus ischemic renal disease due to decreased cardiac output.  Renal function remains stable.  We will transition from furosemide drip to torsemide today.   #2: Congestive heart failure: ECHO from 10/02/22 shows EF 50-55%  with mild LVH and a grade 1 diastolic dysfunction. Patient is advised on importance of strict fluid restriction.  Furosemide drip stopped.  Patient will continue torsemide 20 mg daily.  We will also order metolazone 2.5 mg twice weekly.  We will continue to monitor and manage this outpatient.   #3: Hypertension with chronic kidney disease: We will continue the losartan for long-term cardiorenal protection.  Blood pressure remains soft 95/59.   #4: Second hyperparathyroidism: We will resume calcitriol on discharge.   Calcium remains stable at 8.8.  #5: Hypokalemia: Potassium remains decreased, 3.3.  Continue oral supplementation.  6. Constipation, reports no bowel movement since admission.  Patient reports no bowel movement with lactulose yesterday.  Currently receiving MiraLAX and stool softener.    LOS: [facility] 10/25/20232:35 PM

## 2022-10-09 NOTE — TOC Progression Note (Signed)
Transition of Care [facility]) - Progression Note    Patient Details  Name: John [name] MRN: [id number] Date of Birth: [birth year]  Transition of Care [facility]) CM/SW [place],  Phone Number: 10/09/2022, 1:18 PM  Clinical Narrative:     Patient has changed his mind to be agreeable to SNF now, CSW has sent out referrals pending bed offers at this time.    Expected Discharge Plan: [place] Barriers to Discharge: Continued Medical Work up  Expected Discharge Plan and Services Expected Discharge Plan: [place] In-house Referral: Clinical Social Work   Post Acute Care Choice: [place] arrangements for the past 2 months: [place]: RN, PT, OT HH Agency: [place] Date [place]: 10/06/22 Time [place]: 1508 Representative spoke with at [place]: cory   Social Determinants of Health ([place]) Interventions    Readmission Risk Interventions     No data to display

## 2022-10-09 NOTE — NC FL2 (Signed)
[place] LEVEL OF CARE SCREENING TOOL     IDENTIFICATION  Patient Name: John [name] Birthdate: [birth year] Sex: male Admission Date (Current Location): 10/02/2022  [facility] and [place] Number:  Engineering geologist and Address:  [facility], [address], [place],  [address]      Provider Number: [id number]  Attending Physician Name and Address:  Lorella Nimrod, MD  Relative Name and Phone Number:  [name] [phone]    Current Level of Care: Hospital Recommended Level of Care: [place] Prior Approval Number:    Date Approved/Denied:   PASRR Number: [id number]  Discharge Plan: SNF    Current Diagnoses: Patient Active Problem List   Diagnosis Date Noted   Cellulitis 10/07/2022   Acute on chronic diastolic CHF (congestive heart failure) ([place]) 10/02/2022   Myocardial injury 10/02/2022   Hyperkalemia 10/02/2022   Acute respiratory failure with hypoxia ([place]) 01/28/2022   Cardiorenal syndrome, stage 1-4 or unspecified chronic kidney disease, with heart failure ([place]) 01/28/2022   Acute left systolic heart failure ([place]) 01/28/2022   Cor pulmonale, acute ([place]) 01/28/2022   Renal failure syndrome 01/28/2022   CKD (chronic kidney disease), stage IV ([place]) 01/28/2022   COPD exacerbation ([place]) 01/28/2022   Cardiogenic shock ([place]) 01/28/2022   Erythrocytosis 12/20/2021   Thrombocytopenia ([place]) 12/20/2021   Acute CHF (congestive heart failure) ([place]) 06/21/2020   Hypertension    COPD (chronic obstructive pulmonary disease) ([place]) 02/16/2020   Rupture of left quadriceps tendon 02/15/2020   Community acquired pneumonia 09/01/2019   Pneumonia 09/01/2019    Orientation RESPIRATION BLADDER Height & Weight     Self, Time, Situation, Place  O2 (2L nasal cannula) Incontinent, External catheter Weight: 216 lb 7.9 oz (98.2 kg) Height:  '5\' 10"'$  (177.8 cm)  BEHAVIORAL SYMPTOMS/MOOD NEUROLOGICAL BOWEL NUTRITION STATUS       Continent Diet (see discharge summary)  AMBULATORY STATUS COMMUNICATION OF NEEDS Skin   Extensive Assist Verbally Normal                       Personal Care Assistance Level of Assistance  Bathing, Feeding, Dressing, Total care Bathing Assistance: Limited assistance Feeding assistance: Independent Dressing Assistance: Limited assistance Total Care Assistance: Maximum assistance   Functional Limitations Info  Sight, Hearing, Speech Sight Info: Adequate Hearing Info: Adequate Speech Info: Adequate    SPECIAL CARE FACTORS FREQUENCY  PT (By licensed PT), OT (By licensed OT)     PT Frequency: min 4x weekly OT Frequency: min 4x weekly            Contractures Contractures Info: Not present    Additional Factors Info  Code Status, Allergies Code Status Info: full Allergies Info: no known allergies           Current Medications (10/09/2022):  This is the current hospital active medication list Current Facility-Administered Medications  Medication Dose Route Frequency Provider Last Rate Last Admin   acetaminophen (TYLENOL) tablet 650 mg  650 mg Oral Q6H PRN Ivor Costa, MD       aspirin EC tablet 81 mg  81 mg Oral Daily Ivor Costa, MD   81 mg at 10/09/22 0939   cefTRIAXone (ROCEPHIN) 1 g in sodium chloride 0.9 % 100 mL IVPB  1 g Intravenous Q24H Lorella Nimrod, MD 200 mL/hr at 10/08/22 1249 1 g at 10/08/22 1249   dextromethorphan-guaiFENesin (MUCINEX DM) 30-600 MG per 12 hr tablet 1 tablet  1 tablet Oral BID PRN Ivor Costa,  MD       enoxaparin (LOVENOX) injection 40 mg  40 mg Subcutaneous Q24H Rito Ehrlich A, RPH   40 mg at 10/08/22 2054   gabapentin (NEURONTIN) capsule 100 mg  100 mg Oral BID Lorella Nimrod, MD   100 mg at 10/09/22 0939   hydrALAZINE (APRESOLINE) injection 10 mg  10 mg Intravenous Q4H PRN Amin, Ankit Chirag, MD       ipratropium-albuterol (DUONEB) 0.5-2.5 (3) MG/3ML nebulizer solution 3 mL  3 mL Nebulization Q4H PRN Amin, Ankit Chirag, MD        metoprolol tartrate (LOPRESSOR) injection 5 mg  5 mg Intravenous Q4H PRN Amin, Ankit Chirag, MD       ondansetron (ZOFRAN) injection 4 mg  4 mg Intravenous Q6H PRN Amin, Ankit Chirag, MD       pantoprazole (PROTONIX) EC tablet 20 mg  20 mg Oral Daily Breeze, Shantelle, NP   20 mg at 10/09/22 0939   polyethylene glycol (MIRALAX / GLYCOLAX) packet 17 g  17 g Oral BID PRN Lorella Nimrod, MD   17 g at 10/09/22 0943   potassium chloride SA (KLOR-CON M) CR tablet 40 mEq  40 mEq Oral BID Lorella Nimrod, MD   40 mEq at 10/09/22 0958   senna-docusate (Senokot-S) tablet 1 tablet  1 tablet Oral QHS PRN Amin, Ankit Chirag, MD       traZODone (DESYREL) tablet 50 mg  50 mg Oral QHS PRN Damita Lack, MD   50 mg at 10/06/22 2308     Discharge Medications: Please see discharge summary for a list of discharge medications.  Relevant Imaging Results:  Relevant Lab Results:   Additional Information SSN:[id number]  Alberteen Sam, LCSW

## 2022-10-09 NOTE — Plan of Care (Signed)

## 2022-10-10 ENCOUNTER — Inpatient Hospital Stay: Payer: Medicare HMO

## 2022-10-10 DIAGNOSIS — I5033 Acute on chronic diastolic (congestive) heart failure: Secondary | ICD-10-CM | POA: Diagnosis not present

## 2022-10-10 LAB — MAGNESIUM: Magnesium: 2.1 mg/dL (ref 1.7–2.4)

## 2022-10-10 LAB — BASIC METABOLIC PANEL
Anion gap: 9 (ref 5–15)
BUN: 35 mg/dL — ABNORMAL HIGH (ref 8–23)
CO2: 41 mmol/L — ABNORMAL HIGH (ref 22–32)
Calcium: 8.7 mg/dL — ABNORMAL LOW (ref 8.9–10.3)
Chloride: 90 mmol/L — ABNORMAL LOW (ref 98–111)
Creatinine, Ser: 1.81 mg/dL — ABNORMAL HIGH (ref 0.61–1.24)
GFR, Estimated: 38 mL/min — ABNORMAL LOW (ref >60)
Glucose, Bld: 107 mg/dL — ABNORMAL HIGH (ref 70–99)
Potassium: 3.7 mmol/L (ref 3.5–5.1)
Sodium: 140 mmol/L (ref 135–145)

## 2022-10-10 MED ORDER — CEPHALEXIN 500 MG PO CAPS
500.0000 mg | ORAL_CAPSULE | Freq: Four times a day (QID) | ORAL | Status: AC
  Administered 2022-10-11 – 2022-10-13 (×9): 500 mg via ORAL
  Filled 2022-10-10 (×9): qty 1

## 2022-10-10 NOTE — Progress Notes (Signed)
Occupational Therapy Treatment Patient Details Name: John [name] MRN: [id number] DOB: [birth year] Today's Date: 10/10/2022   History of present illness Pt is a 76 year old with history of diastolic CHF, CKD stage IV, HTN, HLD, COPD, erythrocytosis, cor pulmonale admitted for lower extremity edema and weight gain.  Minimal shortness of breath but requiring 2 L nasal cannula in the ED due to hypoxia.  Patient was seen by his nephrologist Dr. Zollie Scale outpatient and was advised to come to the hospital for IV diuretics.   OT comments  Pt seen for abbreviated OT tx this afternoon, cut short by transport present to take pt for imaging. Pt in recliner, alert, and agreeable at start of session. Roommate/friend present. Pt R hand noted to be slightly less edematous today, pt able to make a fist and endorses improved comfort. Pt completed STS transfer from recliner with MIN A and RW, demonstrating safe hand placement and anterior weight shift prior to lift off without need for cues. Pt ambulated several steps with RW and CGA to EOB. CGA for BLE mgt back to bed + time/effort to complete. On 2L O2 throughout, pt denied pain or SOB. Unable to get accurate SpO2 reading prior to transport's arrival. Pt continues to benefit from skilled OT services. Pt demonstrating progress towards goals. Continue to recommend SNF at this time, however, if pt continues to demo progress, may consider updating recommendation as appropriate.    Recommendations for follow up therapy are one component of a multi-disciplinary discharge planning process, led by the attending physician.  Recommendations may be updated based on patient status, additional functional criteria and insurance authorization.    Follow Up Recommendations  Skilled nursing-short term rehab (<3 hours/day)    Assistance Recommended at Discharge Frequent or constant Supervision/Assistance  Patient can return home with the following  A lot of help with  bathing/dressing/bathroom;Assistance with cooking/housework;Assist for transportation;Help with stairs or ramp for entrance;A little help with walking and/or transfers   Equipment Recommendations  Other (comment) (defer to next venue)    Recommendations for Other Services      Precautions / Restrictions Precautions Precautions: Fall Restrictions Weight Bearing Restrictions: No       Mobility Bed Mobility Overal bed mobility: Needs Assistance Bed Mobility: Sit to Supine       Sit to supine: Min guard, HOB elevated   General bed mobility comments: increased time/effort    Transfers Overall transfer level: Needs assistance Equipment used: Rolling walker (2 wheels) Transfers: Sit to/from Stand Sit to Stand: Min assist           General transfer comment: good hand placement on arm rails, MIN A from recliner     Balance Overall balance assessment: Needs assistance Sitting-balance support: Feet supported, No upper extremity supported Sitting balance-Leahy Scale: Good     Standing balance support: Bilateral upper extremity supported, During functional activity, Reliant on assistive device for balance Standing balance-Leahy Scale: Fair                             ADL either performed or assessed with clinical judgement   ADL                                              Extremity/Trunk Assessment              Vision  Perception     Praxis      Cognition Arousal/Alertness: Awake/alert Behavior During Therapy: Flat affect Overall Cognitive Status: Within Functional Limits for tasks assessed                                          Exercises      Shoulder Instructions       General Comments      Pertinent Vitals/ Pain       Pain Assessment Pain Assessment: No/denies pain  Home Living                                          Prior Functioning/Environment               Frequency  Min 2X/week        Progress Toward Goals  OT Goals(current goals can now be found in the care plan section)  Progress towards OT goals: Progressing toward goals  Acute Rehab OT Goals Patient Stated Goal: get better and go home OT Goal Formulation: With patient Time For Goal Achievement: 10/17/22 Potential to Achieve Goals: Good  Plan Frequency remains appropriate;Discharge plan remains appropriate    Co-evaluation                 AM-PAC OT "6 Clicks" Daily Activity     Outcome Measure   Help from another person eating meals?: None Help from another person taking care of personal grooming?: None Help from another person toileting, which includes using toliet, bedpan, or urinal?: A Lot Help from another person bathing (including washing, rinsing, drying)?: A Little Help from another person to put on and taking off regular upper body clothing?: None Help from another person to put on and taking off regular lower body clothing?: A Little 6 Click Score: 20    End of Session Equipment Utilized During Treatment: Gait belt;Oxygen;Rolling walker (2 wheels)  OT Visit Diagnosis: Other abnormalities of gait and mobility (R26.89);Muscle weakness (generalized) (M62.81)   Activity Tolerance Patient tolerated treatment well   Patient Left in bed;with call bell/phone within reach;Other (comment) (transport staff)   Nurse Communication          Time: [phone] OT Time Calculation (min): 9 min  Charges: OT General Charges $OT Visit: 1 Visit OT Treatments $Therapeutic Activity: 8-22 mins  Ardeth Perfect., MPH, MS, OTR/L ascom [phone] 10/10/22, 3:41 PM

## 2022-10-10 NOTE — Progress Notes (Signed)
[facility] Progress Note  Patient: John [name]    ZOX:[id number]  DOA: 10/02/2022     Date of Service: the patient was seen and examined on 10/10/2022  Chief Complaint  Patient presents with   Shortness of Breath   Leg Swelling    Pt. To ED via POV for generalized swelling x several weeks. Pt. Has CHF and has been in the hospital recently for pneumonia and CHF exacerbation. Pt's O2 sat in triage 80%, 2L O2 [place] applied.   Brief hospital course: Taken from prior notes.   76 year old with history of diastolic CHF, CKD stage IV, HTN, HLD, COPD, erythrocytosis, cor pulmonale admitted for lower extremity edema and weight gain.  Minimal shortness of breath but requiring 2 L nasal cannula in the ED due to hypoxia.  Patient was seen by his nephrologist Dr. Zollie Scale outpatient and was advised to come to the hospital for IV diuretics.  Chest x-ray showed cardiomegaly, nephrology team was consulted.  Creatinine slowly improving with IV diuretics.   10/21: Hemodynamically stable.  Renal function continued to improve, creatinine at 1.72 with improvement of GFR to 41.  Weight seems stable at 220, recorded output of 2600 with net negative of -5.2 L.  Remained on Lasix infusion. Echocardiogram during current hospitalization with normal EF, grade 1 diastolic dysfunction and signs of right ventricular overload.   10/22: Patient continued to have good diuresis with Lasix infusion.  Renal function continues to improve, creatinine at 1.50 today.  Still with significant volume overload.  Continue to require 4 to 5 L of oxygen, patient has home oxygen but was not using it.  Not sure about baseline.   10/23: Patient was continued on Lasix infusion, renal function continue to improve now creatinine of 1.44 and GFR of 50 which makes him CKD stage IIIa.  Stable bicarb at 39.  Remained on 5 L of oxygen but saturating 99 to 100%-we will try weaning to the minimum requirement, most likely will be going home with  oxygen.  Doubt he does not need oxygen but was discharged with oxygen during prior hospitalization.  Most likely noncompliant and need education. PT and OT are now recommending SNF. Developed right hand edema, mild erythema and hyperthermia-start him on ceftriaxone for concern of cellulitis.   10/24: Patient with slight worsening of bicarb and creatinine at 1.53.  Net negative of more than 11 L. Significant improvement in lower extremity edema.  PT/OT are recommending SNF but patient declined and wants to go home with home health services.  Discussed with nephrology and they would like to continue Lasix infusion for another 1 to 2 days before switching him to p.o. Right hand edema and pain improving. He was complaining of burning pain involving both feet, started on Neurontin-dose will need titration.   10/25: Lasix infusion was discontinued due to worsening renal function.  He will be started on torsemide 20 mg daily.  Family and patient now wants to proceed with SNF. TOC is aware.   Assessment and Plan:  * Acute on chronic diastolic CHF (congestive heart failure) ([place]) 2D echo on 01/29/2022 showed EF 55 to 60%.  Patient has leg edema, positive JVD, crackles on auscultation, elevated BNP 1103.  Patient denies shortness breath, has new oxygen requirement, not sure about baseline, as he has home oxygen but was not using it.  Volume status improving, worsening creatinine so Lasix infusion was discontinued.  Nephrology is on board Plan is to Transitioning to torsemide 20 mg daily  -Daily weights -  strict I/O's -Low salt diet -Fluid restriction Bilateral lower extremity edema, follow venous duplex to rule out DVT    CKD (chronic kidney disease), stage IV  Creatinine with some worsening to 1.74, Lasix infusion was discontinued -Monitor renal function -Avoid nephrotoxins Cr 1.8 getting worse   Cellulitis Involving the right hand, improving S/p ceftriaxone, switch to oral Augmentin for total  7-day course Follow venous duplex to rule out DVT, right arm   Myocardial injury Myocardial injury due to CHF exacerbation: Troponin level 32 --> 29.  Denies chest pain.  A1c of 6.3 and LDL of 74 -Continue aspirin.     COPD (chronic obstructive pulmonary disease) (HCC) Stable.  -As needed albuterol and Mucinex   Hypertension - IV hydralazine as needed -Continue Cozaar   Hyperkalemia Resolved. -Continue to monitor   Erythrocytosis Hemoglobin 16.7 -Follow-up with CBC   Thrombocytopenia ([place]) This is a chronic issue, etiology is not clear.  Platelet is stable, 111 (112 on 02/06/2022) -Follow-up with CBC   Body mass index is 31.06 kg/m.  Interventions:      Diet: 2 g sodium DVT Prophylaxis: Subcutaneous Lovenox   Advance goals of care discussion: Full code  Family Communication: family was NOT present at bedside, at the time of interview.  The pt provided permission to discuss medical plan with the family. Opportunity was given to ask question and all questions were answered satisfactorily.   Disposition:  Pt is from Home, admitted with volume overload, still has significant lower extremity edema, which precludes a safe discharge. Discharge to SNF, when clinically improves, may need few more days.  Subjective: No significant overnight events, patient denies any worsening of shortness of breath, patient currently on 2 L oxygen.  Patient still has significant bilateral lower extremity edema and right forearm edema, patient has been minimal pain in the right hand.  Denies any active issues, no chest pain or palpitation, no shortness of breath  Physical Exam: General:  alert oriented to time, place, and person.  Appear in no distress, affect appropriate Eyes: PERRLA ENT: Oral Mucosa Clear, moist  Neck: no JVD,  Cardiovascular: S1 and S2 Present, no Murmur,  Respiratory: good respiratory effort, Bilateral Air entry equal and Decreased, no Crackles, no wheezes Abdomen:  Bowel Sound present, Soft and no tenderness,  Skin: no rashes Extremities: 4+ Pedal edema, no calf tenderness Neurologic: without any new focal findings Gait not checked due to patient safety concerns  Vitals:   10/10/22 0009 10/10/22 0349 10/10/22 0826 10/10/22 1227  BP: 99/73 (!) 89/69 (!) 114/96 (!) 107/91  Pulse: 89 86 86 75  Resp: '16 15 14 18  '$ Temp: 97.7 F (36.5 C) (!) 97.5 F (36.4 C) 97.7 F (36.5 C) 97.8 F (36.6 C)  TempSrc: Oral Oral    SpO2: 97% 93% 95% 97%  Weight:      Height:        Intake/Output Summary (Last 24 hours) at 10/10/2022 1515 Last data filed at 10/10/2022 2993 Gross per 24 hour  Intake 240 ml  Output 785 ml  Net -545 ml   Filed Weights   10/05/22 0355 10/08/22 0432 10/09/22 0500  Weight: 99.8 kg 98.4 kg 98.2 kg    Data Reviewed: I have personally reviewed and interpreted daily labs, tele strips, imagings as discussed above. I reviewed all nursing notes, pharmacy notes, vitals, pertinent old records I have discussed plan of care as described above with RN and patient/family.  CBC: Recent Labs  Lab 10/06/22 0527 10/09/22 0451  WBC 7.7 6.2  HGB 15.8 15.7  HCT 52.5* 51.9  MCV 105.4* 105.7*  PLT 81* 91*   Basic Metabolic Panel: Recent Labs  Lab 10/06/22 0527 10/07/22 0659 10/08/22 0514 10/09/22 0451 10/10/22 0551  NA 144 142 142 141 140  K 3.4* 3.4* 3.4* 3.3* 3.7  CL 98 94* 93* 90* 90*  CO2 39* 39* 42* 42* 41*  GLUCOSE 113* 112* 105* 107* 107*  BUN '19 16 18 '$ 24* 35*  CREATININE 1.50* 1.44* 1.53* 1.74* 1.81*  CALCIUM 8.4* 8.8* 8.8* 8.8* 8.7*  MG 2.1 2.0 2.2 2.1 2.1    Studies: No results found.  Scheduled Meds:  aspirin EC  81 mg Oral Daily   [START ON 10/11/2022] cephALEXin  500 mg Oral Q6H   enoxaparin (LOVENOX) injection  40 mg Subcutaneous Q24H   gabapentin  100 mg Oral BID   pantoprazole  20 mg Oral Daily   Continuous Infusions: PRN Meds: acetaminophen, dextromethorphan-guaiFENesin, hydrALAZINE,  ipratropium-albuterol, metoprolol tartrate, ondansetron (ZOFRAN) IV, polyethylene glycol, senna-docusate, traZODone  Time spent: 35 minutes  Author: Val Riles. MD [facility] 10/10/2022 3:15 PM  To reach On-call, see care teams to locate the attending and reach out to them via [email or web address]. If 7PM-7AM, please contact night-coverage If you still have difficulty reaching the attending provider, please page the [facility] (Director on Call) for [facility] on amion for assistance.

## 2022-10-10 NOTE — Progress Notes (Signed)
[name]  PROGRESS NOTE   Subjective:   Patient seen sitting at side of bed, alert and oriented Tolerating meals however is not happy with breakfast this morning Denies nausea and vomiting Oxygen slowly weaned to 1 L overnight.  Nursing states they will attempt to wean to room air today.   Objective:  Vital signs: Blood pressure (!) 114/96, pulse 86, temperature 97.7 F (36.5 C), resp. rate 14, height '5\' 10"'$  (1.778 m), weight 98.2 kg, SpO2 95 %.  Intake/Output Summary (Last 24 hours) at 10/10/2022 1126 Last data filed at 10/10/2022 0937 Gross per 24 hour  Intake 480 ml  Output 785 ml  Net -305 ml    Filed Weights   10/05/22 0355 10/08/22 0432 10/09/22 0500  Weight: 99.8 kg 98.4 kg 98.2 kg     Physical Exam: General:  No acute distress  Head:  Normocephalic, atraumatic. Moist oral mucosal membranes  Eyes:  Anicteric  Lungs:   Clear to auscultation, normal effort  Heart:  S1S2 no rubs  Abdomen:   Soft, nontender, bowel sounds present  Extremities: 1+ peripheral edema.  Neurologic:  Awake, alert, following commands  Skin:  No lesions  Access: None    Basic Metabolic Panel: Recent Labs  Lab 10/06/22 0527 10/07/22 0659 10/08/22 0514 10/09/22 0451 10/10/22 0551  NA 144 142 142 141 140  K 3.4* 3.4* 3.4* 3.3* 3.7  CL 98 94* 93* 90* 90*  CO2 39* 39* 42* 42* 41*  GLUCOSE 113* 112* 105* 107* 107*  BUN '19 16 18 '$ 24* 35*  CREATININE 1.50* 1.44* 1.53* 1.74* 1.81*  CALCIUM 8.4* 8.8* 8.8* 8.8* 8.7*  MG 2.1 2.0 2.2 2.1 2.1     CBC: Recent Labs  Lab 10/03/22 1315 10/06/22 0527 10/09/22 0451  WBC 8.3 7.7 6.2  HGB 16.9 15.8 15.7  HCT 54.9* 52.5* 51.9  MCV 106.0* 105.4* 105.7*  PLT 98* 81* 91*      Urinalysis: No results for input(s): "COLORURINE", "LABSPEC", "PHURINE", "GLUCOSEU", "HGBUR", "BILIRUBINUR", "KETONESUR", "PROTEINUR", "UROBILINOGEN", "NITRITE", "LEUKOCYTESUR" in the last 72 hours.  Invalid input(s): "APPERANCEUR"     Imaging: No results found.   Medications:    cefTRIAXone (ROCEPHIN)  IV 1 g (10/09/22 1228)    aspirin EC  81 mg Oral Daily   enoxaparin (LOVENOX) injection  40 mg Subcutaneous Q24H   gabapentin  100 mg Oral BID   pantoprazole  20 mg Oral Daily    Assessment/ Plan:     Principal Problem:   Acute on chronic diastolic CHF (congestive heart failure) (HCC) Active Problems:   COPD (chronic obstructive pulmonary disease) (HCC)   Hypertension   Erythrocytosis   Thrombocytopenia (HCC)   CKD (chronic kidney disease), stage IV (HCC)   Myocardial injury   Hyperkalemia   Cellulitis  John [name] is a 76 year old male with past medical conditions including COPD, hyperlipidemia, hypertension, CHF, and chronic kidney disease stage IV.  Patient presents to the emergency room at the advice of nephrologist for evaluation of progressive lower extremity edema.  Patient has been admitted for Acute on chronic diastolic congestive heart failure    #1: Chronic kidney disease with chronic kidney disease stage IV: CKD which is most likely secondary to chronic hypertensive nephrosclerosis versus ischemic renal disease due to decreased cardiac output.  Creatinine slightly increased today however remains acceptable.  Patient transition to torsemide 20 mg yesterday.   #2: Congestive heart failure: ECHO from 10/02/22 shows EF 50-55% with mild LVH and a grade 1 diastolic dysfunction.  Patient is advised on importance of strict fluid restriction.  Patient transition to torsemide yesterday, as above.  Would also recommend metolazone 2.5 mg twice weekly.   #3: Hypertension with chronic kidney disease: We will continue the losartan for long-term cardiorenal protection.  Blood pressure 114/96.   #4: Second hyperparathyroidism: We will resume calcitriol on discharge.  Calcium remains acceptable.  #5: Hypokalemia: Potassium 3.7.  Improved with oral supplementation yesterday.   6. Constipation, reports no  bowel movement since admission.  1 bowel movement recorded yesterday.    LOS: [facility] 10/26/202311:26 AM

## 2022-10-10 NOTE — Progress Notes (Signed)
Physical Therapy Treatment Patient Details Name: John [name] MRN: [id number] DOB: Oct 30, 1946 Today's Date: 10/10/2022   History of Present Illness Pt is a 76 year old with history of diastolic CHF, CKD stage IV, HTN, HLD, COPD, erythrocytosis, cor pulmonale admitted for lower extremity edema and weight gain.  Minimal shortness of breath but requiring 2 L nasal cannula in the ED due to hypoxia.  Patient was seen by his nephrologist Dr. Zollie Scale outpatient and was advised to come to the hospital for IV diuretics.    PT Comments    Pt was long sitting in bed upon arriving. He is A and O but does have some lack of insight on his current deficits. He agrees to OOB but does continue to endorse bilateral foot pain." I wonder if I have gout?" Recommended he mention to his MD. Pt was able to exit bed, stand, and ambulate with RW. Attempted to wean form O2 but quickly desaturates into 70s. Reapplied 2L O2 with pt recovering into 90s. Overall pt is far from baseline. Recommend SNF to maximize independence while assisting pt to PLOF.   Recommendations for follow up therapy are one component of a multi-disciplinary discharge planning process, led by the attending physician.  Recommendations may be updated based on patient status, additional functional criteria and insurance authorization.  Follow Up Recommendations  Skilled nursing-short term rehab (<3 hours/day)     Assistance Recommended at Discharge Frequent or constant Supervision/Assistance  Patient can return home with the following A little help with walking and/or transfers;A little help with bathing/dressing/bathroom;Assistance with cooking/housework;Direct supervision/assist for medications management;Direct supervision/assist for financial management;Assist for transportation;Help with stairs or ramp for entrance   Equipment Recommendations  Other (comment) (defer to next level)       Precautions / Restrictions Precautions Precautions:  Fall Restrictions Weight Bearing Restrictions: No     Mobility  Bed Mobility Overal bed mobility: Needs Assistance Bed Mobility: Supine to Sit  Supine to sit: Min assist, HOB elevated Sit to supine: Min assist   Transfers Overall transfer level: Needs assistance Equipment used: Rolling walker (2 wheels) Transfers: Sit to/from Stand Sit to Stand: Min guard      General transfer comment: CGA for safety    Ambulation/Gait Ambulation/Gait assistance: Min guard, Supervision Gait Distance (Feet): 70 Feet Assistive device: Rolling walker (2 wheels) Gait Pattern/deviations: Step-through pattern, Trunk flexed Gait velocity: decreased     General Gait Details: pt ambulated 70 ft with RW. Attempted to wean from O2 however when on rm air desaturates into low 70s   Balance Overall balance assessment: Needs assistance Sitting-balance support: Feet supported Sitting balance-Leahy Scale: Fair     Standing balance support: Bilateral upper extremity supported, During functional activity, Reliant on assistive device for balance Standing balance-Leahy Scale: Fair         Cognition Arousal/Alertness: Awake/alert Behavior During Therapy: Flat affect Overall Cognitive Status: Within Functional Limits for tasks assessed          General Comments: Pt is A and O x 3. Agrees to session and remians cooperative.           General Comments General comments (skin integrity, edema, etc.): Pt will benefit from SNF at DC to maximize his independence while assisting pt to PLOF.      Pertinent Vitals/Pain Pain Assessment Pain Assessment: 0-10 Pain Score: 4  Pain Location: BLE( gout?) Pain Descriptors / Indicators: Aching, Grimacing, Guarding Pain Intervention(s): Limited activity within patient's tolerance     PT Goals (current goals can now be  found in the care plan section) Acute Rehab PT Goals Patient Stated Goal: Rehab then home Progress towards PT goals: Progressing toward  goals    Frequency    Min 2X/week      PT Plan Current plan remains appropriate       AM-PAC PT "6 Clicks" Mobility   Outcome Measure  Help needed turning from your back to your side while in a flat bed without using bedrails?: A Little Help needed moving from lying on your back to sitting on the side of a flat bed without using bedrails?: A Little Help needed moving to and from a bed to a chair (including a wheelchair)?: A Little Help needed standing up from a chair using your arms (e.g., wheelchair or bedside chair)?: A Little Help needed to walk in hospital room?: A Little Help needed climbing 3-5 steps with a railing? : A Lot 6 Click Score: 17    End of Session Equipment Utilized During Treatment: Gait belt;Oxygen Activity Tolerance: Patient tolerated treatment well;Patient limited by fatigue Patient left: in bed;with call bell/phone within reach;with bed alarm set Nurse Communication: Mobility status PT Visit Diagnosis: Other abnormalities of gait and mobility (R26.89);Difficulty in walking, not elsewhere classified (R26.2);Muscle weakness (generalized) (M62.81)     Time: 2395-3202 PT Time Calculation (min) (ACUTE ONLY): 28 min  Charges:  $Gait Training: 8-22 mins $Therapeutic Activity: 8-22 mins                     Julaine Fusi PTA 10/10/22, 11:35 AM

## 2022-10-11 DIAGNOSIS — I5033 Acute on chronic diastolic (congestive) heart failure: Secondary | ICD-10-CM | POA: Diagnosis not present

## 2022-10-11 LAB — BASIC METABOLIC PANEL
Anion gap: 9 (ref 5–15)
BUN: 32 mg/dL — ABNORMAL HIGH (ref 8–23)
CO2: 40 mmol/L — ABNORMAL HIGH (ref 22–32)
Calcium: 8.8 mg/dL — ABNORMAL LOW (ref 8.9–10.3)
Chloride: 90 mmol/L — ABNORMAL LOW (ref 98–111)
Creatinine, Ser: 1.65 mg/dL — ABNORMAL HIGH (ref 0.61–1.24)
GFR, Estimated: 43 mL/min — ABNORMAL LOW (ref >60)
Glucose, Bld: 101 mg/dL — ABNORMAL HIGH (ref 70–99)
Potassium: 3.5 mmol/L (ref 3.5–5.1)
Sodium: 139 mmol/L (ref 135–145)

## 2022-10-11 LAB — CBC
HCT: 51.1 % (ref 39.0–52.0)
Hemoglobin: 15.6 g/dL (ref 13.0–17.0)
MCH: 31.5 pg (ref 26.0–34.0)
MCHC: 30.5 g/dL (ref 30.0–36.0)
MCV: 103.2 fL — ABNORMAL HIGH (ref 80.0–100.0)
Platelets: 108 10*3/uL — ABNORMAL LOW (ref 150–400)
RBC: 4.95 MIL/uL (ref 4.22–5.81)
RDW: 13.5 % (ref 11.5–15.5)
WBC: 5.6 10*3/uL (ref 4.0–10.5)
nRBC: 0 % (ref 0.0–0.2)

## 2022-10-11 LAB — PHOSPHORUS: Phosphorus: 2.8 mg/dL (ref 2.5–4.6)

## 2022-10-11 LAB — MAGNESIUM: Magnesium: 2.3 mg/dL (ref 1.7–2.4)

## 2022-10-11 MED ORDER — TORSEMIDE 20 MG PO TABS
20.0000 mg | ORAL_TABLET | Freq: Every day | ORAL | Status: DC
  Administered 2022-10-11: 20 mg via ORAL
  Filled 2022-10-11: qty 1

## 2022-10-11 MED ORDER — ORAL CARE MOUTH RINSE: 15.0000 mL | OROMUCOSAL | Status: DC | PRN

## 2022-10-11 NOTE — Progress Notes (Signed)
Occupational Therapy Treatment Patient Details Name: John [name] MRN: [id number] DOB: 10-Jan-1946 Today's Date: 10/11/2022   History of present illness Pt is a 76 year old with history of diastolic CHF, CKD stage IV, HTN, HLD, COPD, erythrocytosis, cor pulmonale admitted for lower extremity edema and weight gain.  Minimal shortness of breath but requiring 2 L nasal cannula in the ED due to hypoxia.  Patient was seen by his nephrologist Dr. Zollie Scale outpatient and was advised to come to the hospital for IV diuretics.   OT comments  Pt seen for brief OT tx session. Pt initially agreeable to session. Up in recliner, on 2L O2. Pt declined to keep nasal cannula in place for mobility. Completed transfer from recliner with increased time/effort and CGA with RW. Pt ambulated with CGA to the door before OT requested to get SpO2 reading with pulse ox. Pt became increasingly agitated/upset with OT and would not allow for pulse ox reading long enough for accurate measure, stating he has always been able to do everything he needed to do, including mowing the yard, with "low oxygen". OT gently attempted to educate on benefits of supplemental O2 when needed to support optimal functioning and safety but pt not receptive. Returned to the recliner. Did agree to replace nasal cannula once seated. All needs in reach. Pt continues to demo poor insight into deficits.     Recommendations for follow up therapy are one component of a multi-disciplinary discharge planning process, led by the attending physician.  Recommendations may be updated based on patient status, additional functional criteria and insurance authorization.    Follow Up Recommendations  Skilled nursing-short term rehab (<3 hours/day)    Assistance Recommended at Discharge Frequent or constant Supervision/Assistance  Patient can return home with the following  Assistance with cooking/housework;Assist for transportation;Help with stairs or ramp for  entrance;A little help with walking and/or transfers;A little help with bathing/dressing/bathroom   Equipment Recommendations  Other (comment) (defer to next venue)    Recommendations for Other Services      Precautions / Restrictions Precautions Precautions: Fall Restrictions Weight Bearing Restrictions: No       Mobility Bed Mobility               General bed mobility comments: NT, in recliner    Transfers Overall transfer level: Needs assistance Equipment used: Rolling walker (2 wheels) Transfers: Sit to/from Stand Sit to Stand: Min guard           General transfer comment: increased time/effort     Balance Overall balance assessment: Needs assistance Sitting-balance support: Feet supported, No upper extremity supported Sitting balance-Leahy Scale: Good     Standing balance support: Bilateral upper extremity supported, During functional activity, Reliant on assistive device for balance Standing balance-Leahy Scale: Fair                             ADL either performed or assessed with clinical judgement   ADL                                              Extremity/Trunk Assessment              Vision       Perception     Praxis      Cognition Arousal/Alertness: Awake/alert Behavior During Therapy: Flat affect  General Comments: poor insight into deficits, angers easily        Exercises Other Exercises Other Exercises: Attempted education regarding benefit and need for supplemental oxygen for exertional activity.    Shoulder Instructions       General Comments Pt on 2L O2 at start of session, declines to walk with it on. Pt does not tolerate OT getting pulse ox reading long enough for accurate reading and then becomes upset stating he can do everything he needs to do with "low oxygen". Did agree to replace nasal cannula once returned to the recliner.     Pertinent Vitals/ Pain       Pain Assessment Pain Assessment: No/denies pain  Home Living                                          Prior Functioning/Environment              Frequency  Min 2X/week        Progress Toward Goals  OT Goals(current goals can now be found in the care plan section)  Progress towards OT goals: Progressing toward goals  Acute Rehab OT Goals Patient Stated Goal: get better and go home OT Goal Formulation: With patient Time For Goal Achievement: 10/17/22 Potential to Achieve Goals: Good  Plan Frequency remains appropriate;Discharge plan remains appropriate    Co-evaluation                 AM-PAC OT "6 Clicks" Daily Activity     Outcome Measure   Help from another person eating meals?: None Help from another person taking care of personal grooming?: None Help from another person toileting, which includes using toliet, bedpan, or urinal?: A Little Help from another person bathing (including washing, rinsing, drying)?: A Little Help from another person to put on and taking off regular upper body clothing?: None Help from another person to put on and taking off regular lower body clothing?: A Little 6 Click Score: 21    End of Session Equipment Utilized During Treatment: Oxygen;Rolling walker (2 wheels)  OT Visit Diagnosis: Other abnormalities of gait and mobility (R26.89);Muscle weakness (generalized) (M62.81)   Activity Tolerance Treatment limited secondary to agitation   Patient Left in chair;with call bell/phone within reach;with family/visitor present (nasal cannula in place)   Nurse Communication          Time: 0712-1975 OT Time Calculation (min): 10 min  Charges: OT General Charges $OT Visit: 1 Visit OT Treatments $Therapeutic Activity: 8-22 mins  Ardeth Perfect., MPH, MS, OTR/L ascom [phone] 10/11/22, 2:55 PM

## 2022-10-11 NOTE — Care Management Important Message (Signed)
Important Message  Patient Details  Name: John [name] MRN: [id number] Date of Birth: 1946-02-24   Medicare Important Message Given:  Yes     [name] 10/11/2022, 1:36 PM

## 2022-10-11 NOTE — Progress Notes (Signed)
[name]  PROGRESS NOTE   Subjective:   Patient seen sitting up in bed Alert and oriented Appetite appropriate, denies nausea and vomiting Lower extremity edema greatly improved  Urine output 750 mL in preceding 24 hours   Objective:  Vital signs: Blood pressure 97/60, pulse 86, temperature 97.8 F (36.6 C), temperature source Oral, resp. rate 18, height '5\' 10"'$  (1.778 m), weight 98.1 kg, SpO2 91 %.  Intake/Output Summary (Last 24 hours) at 10/11/2022 1255 Last data filed at 10/11/2022 1013 Gross per 24 hour  Intake 240 ml  Output 750 ml  Net -510 ml    Filed Weights   10/08/22 0432 10/09/22 0500 10/11/22 0414  Weight: 98.4 kg 98.2 kg 98.1 kg     Physical Exam: General:  No acute distress  Head:  Normocephalic, atraumatic. Moist oral mucosal membranes  Eyes:  Anicteric  Lungs:   Clear to auscultation, normal effort  Heart:  S1S2 no rubs  Abdomen:   Soft, nontender, bowel sounds present  Extremities: 1+ peripheral edema.  Neurologic:  Awake, alert, following commands  Skin:  No lesions  Access: None    Basic Metabolic Panel: Recent Labs  Lab 10/07/22 0659 10/08/22 0514 10/09/22 0451 10/10/22 0551 10/11/22 0542  NA 142 142 141 140 139  K 3.4* 3.4* 3.3* 3.7 3.5  CL 94* 93* 90* 90* 90*  CO2 39* 42* 42* 41* 40*  GLUCOSE 112* 105* 107* 107* 101*  BUN 16 18 24* 35* 32*  CREATININE 1.44* 1.53* 1.74* 1.81* 1.65*  CALCIUM 8.8* 8.8* 8.8* 8.7* 8.8*  MG 2.0 2.2 2.1 2.1 2.3  PHOS  --   --   --   --  2.8     CBC: Recent Labs  Lab 10/06/22 0527 10/09/22 0451 10/11/22 0542  WBC 7.7 6.2 5.6  HGB 15.8 15.7 15.6  HCT 52.5* 51.9 51.1  MCV 105.4* 105.7* 103.2*  PLT 81* 91* 108*      Urinalysis: No results for input(s): "COLORURINE", "LABSPEC", "PHURINE", "GLUCOSEU", "HGBUR", "BILIRUBINUR", "KETONESUR", "PROTEINUR", "UROBILINOGEN", "NITRITE", "LEUKOCYTESUR" in the last 72 hours.  Invalid input(s): "APPERANCEUR"    Imaging: US Venous Img  Lower Bilateral (DVT)  Result Date: 10/10/2022 CLINICAL DATA:  Bilateral lower extremity edema.  Evaluate for DVT. EXAM: BILATERAL LOWER EXTREMITY VENOUS DOPPLER ULTRASOUND TECHNIQUE: Gray-scale sonography with graded compression, as well as color Doppler and duplex ultrasound were performed to evaluate the lower extremity deep venous systems from the level of the common femoral vein and including the common femoral, femoral, profunda femoral, popliteal and calf veins including the posterior tibial, peroneal and gastrocnemius veins when visible. The superficial great saphenous vein was also interrogated. Spectral Doppler was utilized to evaluate flow at rest and with distal augmentation maneuvers in the common femoral, femoral and popliteal veins. COMPARISON:  Bilateral lower extremity venous Doppler ultrasound-06/14/2020 (negative). FINDINGS: RIGHT LOWER EXTREMITY Common Femoral Vein: No evidence of thrombus. Normal compressibility, respiratory phasicity and response to augmentation. Saphenofemoral Junction: No evidence of thrombus. Normal compressibility and flow on color Doppler imaging. Profunda Femoral Vein: No evidence of thrombus. Normal compressibility and flow on color Doppler imaging. Femoral Vein: No evidence of thrombus. Normal compressibility, respiratory phasicity and response to augmentation. Popliteal Vein: No evidence of thrombus. Normal compressibility, respiratory phasicity and response to augmentation. Calf Veins: No evidence of thrombus. Normal compressibility and flow on color Doppler imaging. Superficial Great Saphenous Vein: No evidence of thrombus. Normal compressibility. Other Findings:  None. LEFT LOWER EXTREMITY Common Femoral Vein: No evidence of  thrombus. Normal compressibility, respiratory phasicity and response to augmentation. Saphenofemoral Junction: No evidence of thrombus. Normal compressibility and flow on color Doppler imaging. Profunda Femoral Vein: No evidence of thrombus.  Normal compressibility and flow on color Doppler imaging. Femoral Vein: No evidence of thrombus. Normal compressibility, respiratory phasicity and response to augmentation. Popliteal Vein: No evidence of thrombus. Normal compressibility, respiratory phasicity and response to augmentation. Calf Veins: No evidence of thrombus. Normal compressibility and flow on color Doppler imaging. Superficial Great Saphenous Vein: No evidence of thrombus. Normal compressibility. Other Findings:  None. IMPRESSION: No evidence of DVT within either lower extremity. Electronically Signed   By: Sandi Mariscal M.D.   On: 10/10/2022 16:47   US Venous Img Upper Uni Right(DVT)  Result Date: 10/10/2022 CLINICAL DATA:  Right upper extremity edema.  Evaluate for DVT. EXAM: RIGHT UPPER EXTREMITY VENOUS DOPPLER ULTRASOUND TECHNIQUE: Gray-scale sonography with graded compression, as well as color Doppler and duplex ultrasound were performed to evaluate the upper extremity deep venous system from the level of the subclavian vein and including the jugular, axillary, basilic, radial, ulnar and upper cephalic vein. Spectral Doppler was utilized to evaluate flow at rest and with distal augmentation maneuvers. COMPARISON:  None Available. FINDINGS: Contralateral Subclavian Vein: Respiratory phasicity is normal and symmetric with the symptomatic side. No evidence of thrombus. Normal compressibility. Internal Jugular Vein: No evidence of thrombus. Normal compressibility, respiratory phasicity and response to augmentation. Subclavian Vein: No evidence of thrombus. Normal compressibility, respiratory phasicity and response to augmentation. Axillary Vein: No evidence of thrombus. Normal compressibility, respiratory phasicity and response to augmentation. Cephalic Vein: No evidence of thrombus. Normal compressibility, respiratory phasicity and response to augmentation. Basilic Vein: No evidence of thrombus. Normal compressibility, respiratory phasicity and  response to augmentation. Brachial Veins: No evidence of thrombus. Normal compressibility, respiratory phasicity and response to augmentation. Radial Veins: No evidence of thrombus. Normal compressibility, respiratory phasicity and response to augmentation. Ulnar Veins: No evidence of thrombus. Normal compressibility, respiratory phasicity and response to augmentation. Other Findings:  None visualized. IMPRESSION: No evidence of DVT within the right upper extremity. Electronically Signed   By: Sandi Mariscal M.D.   On: 10/10/2022 16:46     Medications:      aspirin EC  81 mg Oral Daily   cephALEXin  500 mg Oral Q6H   enoxaparin (LOVENOX) injection  40 mg Subcutaneous Q24H   gabapentin  100 mg Oral BID   pantoprazole  20 mg Oral Daily   torsemide  20 mg Oral Daily    Assessment/ Plan:     Principal Problem:   Acute on chronic diastolic CHF (congestive heart failure) (HCC) Active Problems:   COPD (chronic obstructive pulmonary disease) (HCC)   Hypertension   Erythrocytosis   Thrombocytopenia (HCC)   CKD (chronic kidney disease), stage IV (HCC)   Myocardial injury   Hyperkalemia   Cellulitis  John [name] is a 76 year old male with past medical conditions including COPD, hyperlipidemia, hypertension, CHF, and chronic kidney disease stage IV.  Patient presents to the emergency room at the advice of nephrologist for evaluation of progressive lower extremity edema.  Patient has been admitted for Acute on chronic diastolic congestive heart failure    #1: Chronic kidney disease with chronic kidney disease stage IV: CKD which is most likely secondary to chronic hypertensive nephrosclerosis versus ischemic renal disease due to decreased cardiac output.  Renal function remains stable.  We will start patient on torsemide 20 mg daily.  Will require continued outpatient follow-up with  our office at discharge.   #2: Congestive heart failure: ECHO from 10/02/22 shows EF 50-55% with mild LVH and a  grade 1 diastolic dysfunction. Patient is advised on fluid restriction.  We will order torsemide as above.  Also recommend metolazone 2.5 mg twice weekly.   #3: Hypertension with chronic kidney disease: We will continue the losartan for long-term cardiorenal protection.  Blood pressure soft today, 104/72.   #4: Second hyperparathyroidism: We will resume calcitriol on discharge.  Calcium remains acceptable.  #5: Hypokalemia: Potassium stable, 3.5   6. Constipation, resolved with bowel regimen   LOS: [facility] 10/27/202312:55 PM

## 2022-10-11 NOTE — TOC Progression Note (Addendum)
Transition of Care [facility]) - Progression Note    Patient Details  Name: Kase Shughart MRN: [id number] Date of Birth: 1946/09/16  Transition of Care [facility]) CM/SW [place], [place] Phone Number: 10/11/2022, 10:15 AM  Clinical Narrative:     Update: [employer], insurance auth started.    CSW has provided bed offers to patient who requested CSW call his friend [name] as he is undecided. [name] reports she will discuss the bed offers with patient and let CSW know once they choose.   Pending bed choice from patient/family at this time.    Expected Discharge Plan: [place] Barriers to Discharge: Continued Medical Work up  Expected Discharge Plan and Services Expected Discharge Plan: [place] In-house Referral: Clinical Social Work   Post Acute Care Choice: [place] arrangements for the past 2 months: [place]: RN, PT, OT HH Agency: [place] Date [place]: 10/06/22 Time [place]: 1508 Representative spoke with at [facility]: cory   Social Determinants of Health ([place]) Interventions    Readmission Risk Interventions     No data to display

## 2022-10-11 NOTE — Progress Notes (Addendum)
[facility] Progress Note  Patient: John [name]    GTX:[id number]  DOA: 10/02/2022     Date of Service: the patient was seen and examined on 10/11/2022  Chief Complaint  Patient presents with   Shortness of Breath   Leg Swelling    Pt. To ED via POV for generalized swelling x several weeks. Pt. Has CHF and has been in the hospital recently for pneumonia and CHF exacerbation. Pt's O2 sat in triage 80%, 2L O2 [place] applied.   Brief hospital course: Taken from prior notes.   76 year old with history of diastolic CHF, CKD stage IV, HTN, HLD, COPD, erythrocytosis, cor pulmonale admitted for lower extremity edema and weight gain.  Minimal shortness of breath but requiring 2 L nasal cannula in the ED due to hypoxia.  Patient was seen by his nephrologist Dr. Zollie Scale outpatient and was advised to come to the hospital for IV diuretics.  Chest x-ray showed cardiomegaly, nephrology team was consulted.  Creatinine slowly improving with IV diuretics.   10/21: Hemodynamically stable.  Renal function continued to improve, creatinine at 1.72 with improvement of GFR to 41.  Weight seems stable at 220, recorded output of 2600 with net negative of -5.2 L.  Remained on Lasix infusion. Echocardiogram during current hospitalization with normal EF, grade 1 diastolic dysfunction and signs of right ventricular overload.   10/22: Patient continued to have good diuresis with Lasix infusion.  Renal function continues to improve, creatinine at 1.50 today.  Still with significant volume overload.  Continue to require 4 to 5 L of oxygen, patient has home oxygen but was not using it.  Not sure about baseline.   10/23: Patient was continued on Lasix infusion, renal function continue to improve now creatinine of 1.44 and GFR of 50 which makes him CKD stage IIIa.  Stable bicarb at 39.  Remained on 5 L of oxygen but saturating 99 to 100%-we will try weaning to the minimum requirement, most likely will be going home with  oxygen.  Doubt he does not need oxygen but was discharged with oxygen during prior hospitalization.  Most likely noncompliant and need education. PT and OT are now recommending SNF. Developed right hand edema, mild erythema and hyperthermia-start him on ceftriaxone for concern of cellulitis.   10/24: Patient with slight worsening of bicarb and creatinine at 1.53.  Net negative of more than 11 L. Significant improvement in lower extremity edema.  PT/OT are recommending SNF but patient declined and wants to go home with home health services.  Discussed with nephrology and they would like to continue Lasix infusion for another 1 to 2 days before switching him to p.o. Right hand edema and pain improving. He was complaining of burning pain involving both feet, started on Neurontin-dose will need titration.   10/25: Lasix infusion was discontinued due to worsening renal function.  He will be started on torsemide 20 mg daily.  Family and patient now wants to proceed with SNF. TOC is aware.   Assessment and Plan:  * Acute on chronic diastolic CHF (congestive heart failure) ([place]) 2D echo on 01/29/2022 showed EF 55 to 60%.  Patient has leg edema, positive JVD, crackles on auscultation, elevated BNP 1103.  Patient denies shortness breath, has new oxygen requirement, not sure about baseline, as he has home oxygen but was not using it.  Volume status improving, worsening creatinine so Lasix infusion was discontinued.  Nephrology is on board 10/27 started Torsemide 20 mg daily as per Nephro Bilateral lower extremity  venous duplex negative for DVT -Daily weights -strict I/O's -Low salt diet -Fluid restriction   CKD (chronic kidney disease), stage IV  Creatinine with some worsening to 1.74, Lasix infusion was discontinued -Monitor renal function -Avoid nephrotoxins Cr 1.8 --1.65 gradually improving    Cellulitis Involving the right hand, improving S/p ceftriaxone, switch to oral Augmentin for total  7-day course venous duplex of right arm negative for DVT    Myocardial injury Myocardial injury due to CHF exacerbation: Troponin level 32 --> 29.  Denies chest pain.  A1c of 6.3 and LDL of 74 -Continue aspirin.     COPD (chronic obstructive pulmonary disease) (HCC) Stable.  -As needed albuterol and Mucinex   Hypertension - IV hydralazine as needed -Continue Cozaar   Hyperkalemia Resolved. -Continue to monitor   Erythrocytosis Hemoglobin 16.7 -Follow-up with CBC   Thrombocytopenia ([place]) This is a chronic issue, etiology is not clear.  Platelet is stable, 111 (112 on 02/06/2022) -Follow-up with CBC   Body mass index is 31.06 kg/m.  Interventions:      Diet: 2 g sodium DVT Prophylaxis: Subcutaneous Lovenox   Advance goals of care discussion: Full code  Family Communication: family was NOT present at bedside, at the time of interview.  The pt provided permission to discuss medical plan with the family. Opportunity was given to ask question and all questions were answered satisfactorily.   Disposition:  Pt is from Home, admitted with volume overload, still has significant lower extremity edema, which precludes a safe discharge. Discharge to SNF, when clinically improves, and cleared by nephrology.   Subjective: No significant overnight events, patient denies any worsening of shortness of breath, patient currently on 2 L oxygen.  Patient still has significant bilateral lower extremity edema and right forearm edema, patient has been minimal pain in the right hand.  Denies any active issues, no chest pain or palpitation, no shortness of breath  Physical Exam: General:  alert oriented to time, place, and person.  Appear in no distress, affect appropriate Eyes: PERRLA ENT: Oral Mucosa Clear, moist  Neck: no JVD,  Cardiovascular: S1 and S2 Present, no Murmur,  Respiratory: good respiratory effort, Bilateral Air entry equal and Decreased, no Crackles, no wheezes Abdomen:  Bowel Sound present, Soft and no tenderness,  Skin: no rashes Extremities: 4+ Pedal edema, no calf tenderness, right Hand and forearm edema Neurologic: without any new focal findings Gait not checked due to patient safety concerns  Vitals:   10/11/22 0320 10/11/22 0414 10/11/22 0733 10/11/22 1114  BP: 100/78  104/72 97/60  Pulse: 91  87 86  Resp: '18  18 18  '$ Temp: 98.4 F (36.9 C)  98.9 F (37.2 C) 97.8 F (36.6 C)  TempSrc:   Oral Oral  SpO2: (!) 89%  90% 91%  Weight:  98.1 kg    Height:        Intake/Output Summary (Last 24 hours) at 10/11/2022 1446 Last data filed at 10/11/2022 1400 Gross per 24 hour  Intake 480 ml  Output 750 ml  Net -270 ml   Filed Weights   10/08/22 0432 10/09/22 0500 10/11/22 0414  Weight: 98.4 kg 98.2 kg 98.1 kg    Data Reviewed: I have personally reviewed and interpreted daily labs, tele strips, imagings as discussed above. I reviewed all nursing notes, pharmacy notes, vitals, pertinent old records I have discussed plan of care as described above with RN and patient/family.  CBC: Recent Labs  Lab 10/06/22 0527 10/09/22 0451 10/11/22 0542  WBC  7.7 6.2 5.6  HGB 15.8 15.7 15.6  HCT 52.5* 51.9 51.1  MCV 105.4* 105.7* 103.2*  PLT 81* 91* 482*   Basic Metabolic Panel: Recent Labs  Lab 10/07/22 0659 10/08/22 0514 10/09/22 0451 10/10/22 0551 10/11/22 0542  NA 142 142 141 140 139  K 3.4* 3.4* 3.3* 3.7 3.5  CL 94* 93* 90* 90* 90*  CO2 39* 42* 42* 41* 40*  GLUCOSE 112* 105* 107* 107* 101*  BUN 16 18 24* 35* 32*  CREATININE 1.44* 1.53* 1.74* 1.81* 1.65*  CALCIUM 8.8* 8.8* 8.8* 8.7* 8.8*  MG 2.0 2.2 2.1 2.1 2.3  PHOS  --   --   --   --  2.8    Studies: US Venous Img Lower Bilateral (DVT)  Result Date: 10/10/2022 CLINICAL DATA:  Bilateral lower extremity edema.  Evaluate for DVT. EXAM: BILATERAL LOWER EXTREMITY VENOUS DOPPLER ULTRASOUND TECHNIQUE: Gray-scale sonography with graded compression, as well as color Doppler and duplex  ultrasound were performed to evaluate the lower extremity deep venous systems from the level of the common femoral vein and including the common femoral, femoral, profunda femoral, popliteal and calf veins including the posterior tibial, peroneal and gastrocnemius veins when visible. The superficial great saphenous vein was also interrogated. Spectral Doppler was utilized to evaluate flow at rest and with distal augmentation maneuvers in the common femoral, femoral and popliteal veins. COMPARISON:  Bilateral lower extremity venous Doppler ultrasound-06/14/2020 (negative). FINDINGS: RIGHT LOWER EXTREMITY Common Femoral Vein: No evidence of thrombus. Normal compressibility, respiratory phasicity and response to augmentation. Saphenofemoral Junction: No evidence of thrombus. Normal compressibility and flow on color Doppler imaging. Profunda Femoral Vein: No evidence of thrombus. Normal compressibility and flow on color Doppler imaging. Femoral Vein: No evidence of thrombus. Normal compressibility, respiratory phasicity and response to augmentation. Popliteal Vein: No evidence of thrombus. Normal compressibility, respiratory phasicity and response to augmentation. Calf Veins: No evidence of thrombus. Normal compressibility and flow on color Doppler imaging. Superficial Great Saphenous Vein: No evidence of thrombus. Normal compressibility. Other Findings:  None. LEFT LOWER EXTREMITY Common Femoral Vein: No evidence of thrombus. Normal compressibility, respiratory phasicity and response to augmentation. Saphenofemoral Junction: No evidence of thrombus. Normal compressibility and flow on color Doppler imaging. Profunda Femoral Vein: No evidence of thrombus. Normal compressibility and flow on color Doppler imaging. Femoral Vein: No evidence of thrombus. Normal compressibility, respiratory phasicity and response to augmentation. Popliteal Vein: No evidence of thrombus. Normal compressibility, respiratory phasicity and  response to augmentation. Calf Veins: No evidence of thrombus. Normal compressibility and flow on color Doppler imaging. Superficial Great Saphenous Vein: No evidence of thrombus. Normal compressibility. Other Findings:  None. IMPRESSION: No evidence of DVT within either lower extremity. Electronically Signed   By: Sandi Mariscal M.D.   On: 10/10/2022 16:47   US Venous Img Upper Uni Right(DVT)  Result Date: 10/10/2022 CLINICAL DATA:  Right upper extremity edema.  Evaluate for DVT. EXAM: RIGHT UPPER EXTREMITY VENOUS DOPPLER ULTRASOUND TECHNIQUE: Gray-scale sonography with graded compression, as well as color Doppler and duplex ultrasound were performed to evaluate the upper extremity deep venous system from the level of the subclavian vein and including the jugular, axillary, basilic, radial, ulnar and upper cephalic vein. Spectral Doppler was utilized to evaluate flow at rest and with distal augmentation maneuvers. COMPARISON:  None Available. FINDINGS: Contralateral Subclavian Vein: Respiratory phasicity is normal and symmetric with the symptomatic side. No evidence of thrombus. Normal compressibility. Internal Jugular Vein: No evidence of thrombus. Normal compressibility, respiratory phasicity and  response to augmentation. Subclavian Vein: No evidence of thrombus. Normal compressibility, respiratory phasicity and response to augmentation. Axillary Vein: No evidence of thrombus. Normal compressibility, respiratory phasicity and response to augmentation. Cephalic Vein: No evidence of thrombus. Normal compressibility, respiratory phasicity and response to augmentation. Basilic Vein: No evidence of thrombus. Normal compressibility, respiratory phasicity and response to augmentation. Brachial Veins: No evidence of thrombus. Normal compressibility, respiratory phasicity and response to augmentation. Radial Veins: No evidence of thrombus. Normal compressibility, respiratory phasicity and response to augmentation. Ulnar  Veins: No evidence of thrombus. Normal compressibility, respiratory phasicity and response to augmentation. Other Findings:  None visualized. IMPRESSION: No evidence of DVT within the right upper extremity. Electronically Signed   By: Sandi Mariscal M.D.   On: 10/10/2022 16:46    Scheduled Meds:  aspirin EC  81 mg Oral Daily   cephALEXin  500 mg Oral Q6H   enoxaparin (LOVENOX) injection  40 mg Subcutaneous Q24H   gabapentin  100 mg Oral BID   pantoprazole  20 mg Oral Daily   torsemide  20 mg Oral Daily   Continuous Infusions: PRN Meds: acetaminophen, dextromethorphan-guaiFENesin, hydrALAZINE, ipratropium-albuterol, metoprolol tartrate, ondansetron (ZOFRAN) IV, mouth rinse, polyethylene glycol, senna-docusate, traZODone  Time spent: 35 minutes  Author: Val Riles. MD [facility] 10/11/2022 2:46 PM  To reach On-call, see care teams to locate the attending and reach out to them via [email or web address]. If 7PM-7AM, please contact night-coverage If you still have difficulty reaching the attending provider, please page the [facility] (Director on Call) for [facility] on amion for assistance.

## 2022-10-12 DIAGNOSIS — I5033 Acute on chronic diastolic (congestive) heart failure: Secondary | ICD-10-CM | POA: Diagnosis not present

## 2022-10-12 LAB — BASIC METABOLIC PANEL
Anion gap: 7 (ref 5–15)
BUN: 33 mg/dL — ABNORMAL HIGH (ref 8–23)
CO2: 42 mmol/L — ABNORMAL HIGH (ref 22–32)
Calcium: 9 mg/dL (ref 8.9–10.3)
Chloride: 90 mmol/L — ABNORMAL LOW (ref 98–111)
Creatinine, Ser: 1.91 mg/dL — ABNORMAL HIGH (ref 0.61–1.24)
GFR, Estimated: 36 mL/min — ABNORMAL LOW (ref >60)
Glucose, Bld: 97 mg/dL (ref 70–99)
Potassium: 3.5 mmol/L (ref 3.5–5.1)
Sodium: 139 mmol/L (ref 135–145)

## 2022-10-12 LAB — CBC
HCT: 50.8 % (ref 39.0–52.0)
Hemoglobin: 15.5 g/dL (ref 13.0–17.0)
MCH: 31.6 pg (ref 26.0–34.0)
MCHC: 30.5 g/dL (ref 30.0–36.0)
MCV: 103.5 fL — ABNORMAL HIGH (ref 80.0–100.0)
Platelets: 112 10*3/uL — ABNORMAL LOW (ref 150–400)
RBC: 4.91 MIL/uL (ref 4.22–5.81)
RDW: 13.5 % (ref 11.5–15.5)
WBC: 5.5 10*3/uL (ref 4.0–10.5)
nRBC: 0 % (ref 0.0–0.2)

## 2022-10-12 LAB — MAGNESIUM: Magnesium: 2.4 mg/dL (ref 1.7–2.4)

## 2022-10-12 LAB — PHOSPHORUS: Phosphorus: 2.8 mg/dL (ref 2.5–4.6)

## 2022-10-12 MED ORDER — MIDODRINE HCL 5 MG PO TABS
5.0000 mg | ORAL_TABLET | Freq: Three times a day (TID) | ORAL | Status: DC
  Administered 2022-10-12 – 2022-10-15 (×9): 5 mg via ORAL
  Filled 2022-10-12 (×9): qty 1

## 2022-10-12 MED ORDER — TORSEMIDE 20 MG PO TABS
20.0000 mg | ORAL_TABLET | Freq: Every day | ORAL | Status: DC
  Administered 2022-10-13 – 2022-10-15 (×3): 20 mg via ORAL
  Filled 2022-10-12 (×3): qty 1

## 2022-10-12 MED ORDER — TORSEMIDE 20 MG PO TABS: 20.0000 mg | ORAL_TABLET | Freq: Every day | ORAL | Status: DC

## 2022-10-12 NOTE — TOC Progression Note (Signed)
Transition of Care [facility]) - Progression Note    Patient Details  Name: John [name] MRN: [id number] Date of Birth: [birth year]  Transition of Care [facility]) CM/SW [place], [place] Phone Number: 10/12/2022, 1:34 PM  Clinical Narrative:     Patient's insurance authorization approved to go to [facility]. They do not do weekend admissions, should patient be medically cleared can dc earliest Monday to [facility]. Magda Paganini at [facility] updated with insurance auth information:  Plan Auth: [id number] Candace Cruise: [id number]  Expected Discharge Plan: [place] Barriers to Discharge: Continued Medical Work up  Expected Discharge Plan and Services Expected Discharge Plan: [place] In-house Referral: Clinical Social Work   Post Acute Care Choice: [place] arrangements for the past 2 months: [place]: RN, PT, OT [facility] Agency: [place] Date [place]: 10/06/22 Time [place]: [id number] Representative spoke with at [place]: cory   Social Determinants of Health ([place]) Interventions    Readmission Risk Interventions     No data to display

## 2022-10-12 NOTE — Progress Notes (Signed)
[facility] Progress Note  Patient: John [name]    QMV:[id number]  DOA: 10/02/2022     Date of Service: the patient was seen and examined on 10/12/2022  Chief Complaint  Patient presents with   Shortness of Breath   Leg Swelling    Pt. To ED via POV for generalized swelling x several weeks. Pt. Has CHF and has been in the hospital recently for pneumonia and CHF exacerbation. Pt's O2 sat in triage 80%, 2L O2 [place] applied.   Brief hospital course: Taken from prior notes.   76 year old with history of diastolic CHF, CKD stage IV, HTN, HLD, COPD, erythrocytosis, cor pulmonale admitted for lower extremity edema and weight gain.  Minimal shortness of breath but requiring 2 L nasal cannula in the ED due to hypoxia.  Patient was seen by his nephrologist Dr. Zollie Scale outpatient and was advised to come to the hospital for IV diuretics.  Chest x-ray showed cardiomegaly, nephrology team was consulted.  Creatinine slowly improving with IV diuretics.   10/21: Hemodynamically stable.  Renal function continued to improve, creatinine at 1.72 with improvement of GFR to 41.  Weight seems stable at 220, recorded output of 2600 with net negative of -5.2 L.  Remained on Lasix infusion. Echocardiogram during current hospitalization with normal EF, grade 1 diastolic dysfunction and signs of right ventricular overload.   10/22: Patient continued to have good diuresis with Lasix infusion.  Renal function continues to improve, creatinine at 1.50 today.  Still with significant volume overload.  Continue to require 4 to 5 L of oxygen, patient has home oxygen but was not using it.  Not sure about baseline.   10/23: Patient was continued on Lasix infusion, renal function continue to improve now creatinine of 1.44 and GFR of 50 which makes him CKD stage IIIa.  Stable bicarb at 39.  Remained on 5 L of oxygen but saturating 99 to 100%-we will try weaning to the minimum requirement, most likely will be going home with  oxygen.  Doubt he does not need oxygen but was discharged with oxygen during prior hospitalization.  Most likely noncompliant and need education. PT and OT are now recommending SNF. Developed right hand edema, mild erythema and hyperthermia-start him on ceftriaxone for concern of cellulitis.   10/24: Patient with slight worsening of bicarb and creatinine at 1.53.  Net negative of more than 11 L. Significant improvement in lower extremity edema.  PT/OT are recommending SNF but patient declined and wants to go home with home health services.  Discussed with nephrology and they would like to continue Lasix infusion for another 1 to 2 days before switching him to p.o. Right hand edema and pain improving. He was complaining of burning pain involving both feet, started on Neurontin-dose will need titration.   10/25: Lasix infusion was discontinued due to worsening renal function.  He will be started on torsemide 20 mg daily.  Family and patient now wants to proceed with SNF. TOC is aware.   Assessment and Plan:  * Acute on chronic diastolic CHF (congestive heart failure) ([place]) 2D echo on 01/29/2022 showed EF 55 to 60%.  Patient has leg edema, positive JVD, crackles on auscultation, elevated BNP 1103.  Patient denies shortness breath, has new oxygen requirement, not sure about baseline, as he has home oxygen but was not using it.  Volume status improving, worsening creatinine so Lasix infusion was discontinued.  Nephrology is on board 10/27 started Torsemide 20 mg daily as per Nephro Bilateral lower extremity  venous duplex negative for DVT -Daily weights -strict I/O's -Low salt diet -Fluid restriction 10/28 skipped dose of torsemide today  CKD (chronic kidney disease), stage IV  Creatinine with some worsening to 1.74, Lasix infusion was discontinued -Monitor renal function -Avoid nephrotoxins Cr 1.8 --1.65 --1.9 fluctuating    Cellulitis Involving the right hand, improving S/p ceftriaxone,  switch to oral Augmentin for total 7-day course venous duplex of right arm negative for DVT    Myocardial injury Myocardial injury due to CHF exacerbation: Troponin level 32 --> 29.  Denies chest pain.  A1c of 6.3 and LDL of 74 -Continue aspirin.     COPD (chronic obstructive pulmonary disease) (HCC) Stable.  -As needed albuterol and Mucinex   Hypertension - IV hydralazine as needed -Continue Cozaar   Hyperkalemia Resolved. -Continue to monitor   Erythrocytosis Hemoglobin 16.7 -Follow-up with CBC   Thrombocytopenia ([place]) This is a chronic issue, etiology is not clear.  Platelet is stable, 111 (112 on 02/06/2022) -Follow-up with CBC   Body mass index is 31.06 kg/m.  Interventions:      Diet: 2 g sodium DVT Prophylaxis: Subcutaneous Lovenox   Advance goals of care discussion: Full code  Family Communication: family was NOT present at bedside, at the time of interview.  The pt provided permission to discuss medical plan with the family. Opportunity was given to ask question and all questions were answered satisfactorily.   Disposition:  Pt is from Home, admitted with volume overload, still has significant lower extremity edema, which precludes a safe discharge. Discharge to SNF, when clinically improves, and cleared by nephrology.  Most likely on Monday As per social worker patient has been accepted at [facility] and they can take the patient on Monday   Subjective: No significant overnight events, patient denies any worsening of shortness of breath, patient currently on 2 L oxygen.  Patient still has significant bilateral lower extremity edema and right forearm edema, Mild pain in the right hand.  Denies any active issues, no chest pain or palpitation, no shortness of breath  Physical Exam: General:  alert oriented to time, place, and person.  Appear in no distress, affect appropriate Eyes: PERRLA ENT: Oral Mucosa Clear, moist  Neck: no JVD,  Cardiovascular:  S1 and S2 Present, no Murmur,  Respiratory: good respiratory effort, Bilateral Air entry equal and Decreased, no Crackles, no wheezes Abdomen: Bowel Sound present, Soft and no tenderness,  Skin: no rashes Extremities: 3-4+ Pedal edema, no calf tenderness, right Hand and forearm edema Neurologic: without any new focal findings Gait not checked due to patient safety concerns  Vitals:   10/12/22 0318 10/12/22 0447 10/12/22 0747 10/12/22 1210  BP: 101/74  114/63 110/83  Pulse: 83  87 72  Resp: '20  18 19  '$ Temp: 98.6 F (37 C)  (!) 97.5 F (36.4 C) 97.7 F (36.5 C)  TempSrc:    Oral  SpO2: 90%  90% 93%  Weight:  98.1 kg    Height:        Intake/Output Summary (Last 24 hours) at 10/12/2022 1356 Last data filed at 10/12/2022 1211 Gross per 24 hour  Intake 240 ml  Output 1476 ml  Net -1236 ml   Filed Weights   10/09/22 0500 10/11/22 0414 10/12/22 0447  Weight: 98.2 kg 98.1 kg 98.1 kg    Data Reviewed: I have personally reviewed and interpreted daily labs, tele strips, imagings as discussed above. I reviewed all nursing notes, pharmacy notes, vitals, pertinent old records I  have discussed plan of care as described above with RN and patient/family.  CBC: Recent Labs  Lab 10/06/22 0527 10/09/22 0451 10/11/22 0542 10/12/22 0441  WBC 7.7 6.2 5.6 5.5  HGB 15.8 15.7 15.6 15.5  HCT 52.5* 51.9 51.1 50.8  MCV 105.4* 105.7* 103.2* 103.5*  PLT 81* 91* 108* 712*   Basic Metabolic Panel: Recent Labs  Lab 10/08/22 0514 10/09/22 0451 10/10/22 0551 10/11/22 0542 10/12/22 0441  NA 142 141 140 139 139  K 3.4* 3.3* 3.7 3.5 3.5  CL 93* 90* 90* 90* 90*  CO2 42* 42* 41* 40* 42*  GLUCOSE 105* 107* 107* 101* 97  BUN 18 24* 35* 32* 33*  CREATININE 1.53* 1.74* 1.81* 1.65* 1.91*  CALCIUM 8.8* 8.8* 8.7* 8.8* 9.0  MG 2.2 2.1 2.1 2.3 2.4  PHOS  --   --   --  2.8 2.8    Studies: No results found.  Scheduled Meds:  aspirin EC  81 mg Oral Daily   cephALEXin  500 mg Oral Q6H    enoxaparin (LOVENOX) injection  40 mg Subcutaneous Q24H   gabapentin  100 mg Oral BID   midodrine  5 mg Oral TID WC   pantoprazole  20 mg Oral Daily   [START ON 10/15/2022] torsemide  20 mg Oral Daily   Continuous Infusions: PRN Meds: acetaminophen, dextromethorphan-guaiFENesin, hydrALAZINE, ipratropium-albuterol, metoprolol tartrate, ondansetron (ZOFRAN) IV, mouth rinse, polyethylene glycol, senna-docusate, traZODone  Time spent: 35 minutes  Author: Val Riles. MD [facility] 10/12/2022 1:56 PM  To reach On-call, see care teams to locate the attending and reach out to them via [email or web address]. If 7PM-7AM, please contact night-coverage If you still have difficulty reaching the attending provider, please page the [facility] (Director on Call) for [facility] on amion for assistance.

## 2022-10-12 NOTE — Progress Notes (Addendum)
[name]  PROGRESS NOTE   Subjective:   Patient seen sitting up  Alert and oriented Appetite appropriate, denies nausea and vomiting was able to eat breakfast  Lower extremity edema greatly improved Awaiting rehab placement   Urine output 1400 mL in preceding 24 hours   Objective:  Vital signs: Blood pressure 110/83, pulse 72, temperature 97.7 F (36.5 C), temperature source Oral, resp. rate 19, height '5\' 10"'$  (1.778 m), weight 98.1 kg, SpO2 93 %.  Intake/Output Summary (Last 24 hours) at 10/12/2022 1237 Last data filed at 10/12/2022 1211 Gross per 24 hour  Intake 240 ml  Output 1476 ml  Net -1236 ml    Filed Weights   10/09/22 0500 10/11/22 0414 10/12/22 0447  Weight: 98.2 kg 98.1 kg 98.1 kg     Physical Exam: General:  No acute distress  Head:  Normocephalic, atraumatic. Moist oral mucosal membranes  Eyes:  Anicteric  Lungs:   Crackles Right lower lobe, normal effort  Heart:  S1S2 no rubs  Abdomen:   Soft, nontender, bowel sounds present  Extremities: Tight 1+ peripheral edema.  Neurologic:  Awake, alert, following commands  Skin:  No lesions  Access: None    Basic Metabolic Panel: Recent Labs  Lab 10/08/22 0514 10/09/22 0451 10/10/22 0551 10/11/22 0542 10/12/22 0441  NA 142 141 140 139 139  K 3.4* 3.3* 3.7 3.5 3.5  CL 93* 90* 90* 90* 90*  CO2 42* 42* 41* 40* 42*  GLUCOSE 105* 107* 107* 101* 97  BUN 18 24* 35* 32* 33*  CREATININE 1.53* 1.74* 1.81* 1.65* 1.91*  CALCIUM 8.8* 8.8* 8.7* 8.8* 9.0  MG 2.2 2.1 2.1 2.3 2.4  PHOS  --   --   --  2.8 2.8     CBC: Recent Labs  Lab 10/06/22 0527 10/09/22 0451 10/11/22 0542 10/12/22 0441  WBC 7.7 6.2 5.6 5.5  HGB 15.8 15.7 15.6 15.5  HCT 52.5* 51.9 51.1 50.8  MCV 105.4* 105.7* 103.2* 103.5*  PLT 81* 91* 108* 112*      Urinalysis: No results for input(s): "COLORURINE", "LABSPEC", "PHURINE", "GLUCOSEU", "HGBUR", "BILIRUBINUR", "KETONESUR", "PROTEINUR", "UROBILINOGEN", "NITRITE",  "LEUKOCYTESUR" in the last 72 hours.  Invalid input(s): "APPERANCEUR"    Imaging: US Venous Img Lower Bilateral (DVT)  Result Date: 10/10/2022 CLINICAL DATA:  Bilateral lower extremity edema.  Evaluate for DVT. EXAM: BILATERAL LOWER EXTREMITY VENOUS DOPPLER ULTRASOUND TECHNIQUE: Gray-scale sonography with graded compression, as well as color Doppler and duplex ultrasound were performed to evaluate the lower extremity deep venous systems from the level of the common femoral vein and including the common femoral, femoral, profunda femoral, popliteal and calf veins including the posterior tibial, peroneal and gastrocnemius veins when visible. The superficial great saphenous vein was also interrogated. Spectral Doppler was utilized to evaluate flow at rest and with distal augmentation maneuvers in the common femoral, femoral and popliteal veins. COMPARISON:  Bilateral lower extremity venous Doppler ultrasound-06/14/2020 (negative). FINDINGS: RIGHT LOWER EXTREMITY Common Femoral Vein: No evidence of thrombus. Normal compressibility, respiratory phasicity and response to augmentation. Saphenofemoral Junction: No evidence of thrombus. Normal compressibility and flow on color Doppler imaging. Profunda Femoral Vein: No evidence of thrombus. Normal compressibility and flow on color Doppler imaging. Femoral Vein: No evidence of thrombus. Normal compressibility, respiratory phasicity and response to augmentation. Popliteal Vein: No evidence of thrombus. Normal compressibility, respiratory phasicity and response to augmentation. Calf Veins: No evidence of thrombus. Normal compressibility and flow on color Doppler imaging. Superficial Great Saphenous Vein: No evidence of  thrombus. Normal compressibility. Other Findings:  None. LEFT LOWER EXTREMITY Common Femoral Vein: No evidence of thrombus. Normal compressibility, respiratory phasicity and response to augmentation. Saphenofemoral Junction: No evidence of thrombus.  Normal compressibility and flow on color Doppler imaging. Profunda Femoral Vein: No evidence of thrombus. Normal compressibility and flow on color Doppler imaging. Femoral Vein: No evidence of thrombus. Normal compressibility, respiratory phasicity and response to augmentation. Popliteal Vein: No evidence of thrombus. Normal compressibility, respiratory phasicity and response to augmentation. Calf Veins: No evidence of thrombus. Normal compressibility and flow on color Doppler imaging. Superficial Great Saphenous Vein: No evidence of thrombus. Normal compressibility. Other Findings:  None. IMPRESSION: No evidence of DVT within either lower extremity. Electronically Signed   By: Sandi Mariscal M.D.   On: 10/10/2022 16:47   US Venous Img Upper Uni Right(DVT)  Result Date: 10/10/2022 CLINICAL DATA:  Right upper extremity edema.  Evaluate for DVT. EXAM: RIGHT UPPER EXTREMITY VENOUS DOPPLER ULTRASOUND TECHNIQUE: Gray-scale sonography with graded compression, as well as color Doppler and duplex ultrasound were performed to evaluate the upper extremity deep venous system from the level of the subclavian vein and including the jugular, axillary, basilic, radial, ulnar and upper cephalic vein. Spectral Doppler was utilized to evaluate flow at rest and with distal augmentation maneuvers. COMPARISON:  None Available. FINDINGS: Contralateral Subclavian Vein: Respiratory phasicity is normal and symmetric with the symptomatic side. No evidence of thrombus. Normal compressibility. Internal Jugular Vein: No evidence of thrombus. Normal compressibility, respiratory phasicity and response to augmentation. Subclavian Vein: No evidence of thrombus. Normal compressibility, respiratory phasicity and response to augmentation. Axillary Vein: No evidence of thrombus. Normal compressibility, respiratory phasicity and response to augmentation. Cephalic Vein: No evidence of thrombus. Normal compressibility, respiratory phasicity and response  to augmentation. Basilic Vein: No evidence of thrombus. Normal compressibility, respiratory phasicity and response to augmentation. Brachial Veins: No evidence of thrombus. Normal compressibility, respiratory phasicity and response to augmentation. Radial Veins: No evidence of thrombus. Normal compressibility, respiratory phasicity and response to augmentation. Ulnar Veins: No evidence of thrombus. Normal compressibility, respiratory phasicity and response to augmentation. Other Findings:  None visualized. IMPRESSION: No evidence of DVT within the right upper extremity. Electronically Signed   By: Sandi Mariscal M.D.   On: 10/10/2022 16:46     Medications:      aspirin EC  81 mg Oral Daily   cephALEXin  500 mg Oral Q6H   enoxaparin (LOVENOX) injection  40 mg Subcutaneous Q24H   gabapentin  100 mg Oral BID   midodrine  5 mg Oral TID WC   pantoprazole  20 mg Oral Daily   [START ON 10/15/2022] torsemide  20 mg Oral Daily    Assessment/ Plan:     Principal Problem:   Acute on chronic diastolic CHF (congestive heart failure) (HCC) Active Problems:   COPD (chronic obstructive pulmonary disease) (HCC)   Hypertension   Erythrocytosis   Thrombocytopenia (HCC)   CKD (chronic kidney disease), stage IV (HCC)   Myocardial injury   Hyperkalemia   Cellulitis  Bobbye Reinitz is a 76 year old male with past medical conditions including COPD, hyperlipidemia, hypertension, CHF, and chronic kidney disease stage IV.  Patient presents to the emergency room at the advice of nephrologist for evaluation of progressive lower extremity edema.  Patient has been admitted for Acute on chronic diastolic congestive heart failure    #1: AKI on chronic kidney disease stage IV: CKD is most likely secondary to chronic hypertensive nephrosclerosis versus ischemic renal disease due to  decreased cardiac output.  Patient's creatinine has increased today likely from aggressive diuresis and low blood pressures.  Added midodrine  to achieve higher blood pressure.   #2:  Diastolic congestive heart failure and lower extremity edema: ECHO from 10/02/22 shows EF 50-55% with mild LVH and a grade 1 diastolic dysfunction. Patient is advised on fluid restriction.  Torsemide currently on hold.   #3: Hypertension with chronic kidney disease:   Started midodrine 5 mg tid.    #4: Second hyperparathyroidism: We will resume calcitriol on discharge.  Calcium remains acceptable.  #5: Hypokalemia: Potassium stable, 3.5  6. Constipation, resolved with bowel regimen   LOS: [facility] 10/28/202312:37 PM  Patient was examined and evaluated with Zonia Kief, PA.  Plan of care was formulated and discussed with patient as well as PA.  I agree with the note as documented with edits.

## 2022-10-13 DIAGNOSIS — I5033 Acute on chronic diastolic (congestive) heart failure: Secondary | ICD-10-CM | POA: Diagnosis not present

## 2022-10-13 LAB — CBC
HCT: 54.3 % — ABNORMAL HIGH (ref 39.0–52.0)
Hemoglobin: 16.2 g/dL (ref 13.0–17.0)
MCH: 31.3 pg (ref 26.0–34.0)
MCHC: 29.8 g/dL — ABNORMAL LOW (ref 30.0–36.0)
MCV: 104.8 fL — ABNORMAL HIGH (ref 80.0–100.0)
Platelets: 138 10*3/uL — ABNORMAL LOW (ref 150–400)
RBC: 5.18 MIL/uL (ref 4.22–5.81)
RDW: 13.3 % (ref 11.5–15.5)
WBC: 6.2 10*3/uL (ref 4.0–10.5)
nRBC: 0 % (ref 0.0–0.2)

## 2022-10-13 LAB — BASIC METABOLIC PANEL
Anion gap: 9 (ref 5–15)
BUN: 32 mg/dL — ABNORMAL HIGH (ref 8–23)
CO2: 40 mmol/L — ABNORMAL HIGH (ref 22–32)
Calcium: 9.1 mg/dL (ref 8.9–10.3)
Chloride: 90 mmol/L — ABNORMAL LOW (ref 98–111)
Creatinine, Ser: 1.68 mg/dL — ABNORMAL HIGH (ref 0.61–1.24)
GFR, Estimated: 42 mL/min — ABNORMAL LOW (ref >60)
Glucose, Bld: 106 mg/dL — ABNORMAL HIGH (ref 70–99)
Potassium: 3.9 mmol/L (ref 3.5–5.1)
Sodium: 139 mmol/L (ref 135–145)

## 2022-10-13 LAB — PHOSPHORUS: Phosphorus: 3.6 mg/dL (ref 2.5–4.6)

## 2022-10-13 LAB — MAGNESIUM: Magnesium: 2.7 mg/dL — ABNORMAL HIGH (ref 1.7–2.4)

## 2022-10-13 NOTE — Progress Notes (Signed)
Physical Therapy Treatment Patient Details Name: John [name] MRN: [id number] DOB: 1946/02/28 Today's Date: 10/13/2022   History of Present Illness Pt is a 76 year old with history of diastolic CHF, CKD stage IV, HTN, HLD, COPD, erythrocytosis, cor pulmonale admitted for lower extremity edema and weight gain.  Minimal shortness of breath but requiring 2 L nasal cannula in the ED due to hypoxia.  Patient was seen by his nephrologist Dr. Zollie Scale outpatient and was advised to come to the hospital for IV diuretics.    PT Comments    Pt in chair on room air.  Nasal cannula on bed.  When questioned he stated MD told him to take it off.  Sats 69 on room air.  Denies SOB.  O2 replaced at 2 lpm and RN contacted.  With time and encouragement for deep breathing does slowly increase to 89% with bump in O2 to 3 lpm.  Pt voices frustration stating he runs on low oxygen at baseline and does not need O2 at home.  When at 90%, he is able to walk 140' with good steady gait and no self initiated rest breaks on 4 lpm but O2 at 80% upon sitting.  He chooses to walk again vs standing ex and once recovered, he is able to walk an additional 90'. Pt remained sitting in chair at end of session on  2lpm support at 89% sats.   Recommendations for follow up therapy are one component of a multi-disciplinary discharge planning process, led by the attending physician.  Recommendations may be updated based on patient status, additional functional criteria and insurance authorization.  Follow Up Recommendations  Skilled nursing-short term rehab (<3 hours/day)     Assistance Recommended at Discharge Frequent or constant Supervision/Assistance  Patient can return home with the following A little help with walking and/or transfers;A little help with bathing/dressing/bathroom;Assistance with cooking/housework;Direct supervision/assist for medications management;Direct supervision/assist for financial management;Assist for  transportation;Help with stairs or ramp for entrance   Equipment Recommendations       Recommendations for Other Services       Precautions / Restrictions       Mobility  Bed Mobility               General bed mobility comments: NT, in recliner    Transfers Overall transfer level: Needs assistance Equipment used: Rolling walker (2 wheels) Transfers: Sit to/from Stand Sit to Stand: Min guard                Ambulation/Gait Ambulation/Gait assistance: Min guard, Supervision Gait Distance (Feet): 140 Feet Assistive device: Rolling walker (2 wheels) Gait Pattern/deviations: Step-through pattern, Trunk flexed Gait velocity: decreased     General Gait Details: 140' 90'   Stairs             Wheelchair Mobility    Modified Rankin (Stroke Patients Only)       Balance Overall balance assessment: Needs assistance Sitting-balance support: Feet supported, No upper extremity supported Sitting balance-Leahy Scale: Good     Standing balance support: Bilateral upper extremity supported, During functional activity, Reliant on assistive device for balance Standing balance-Leahy Scale: Good Standing balance comment: with walker support                            Cognition Arousal/Alertness: Awake/alert Behavior During Therapy: Flat affect  General Comments: poor insight into deficits, angers easily        Exercises      General Comments        Pertinent Vitals/Pain Pain Assessment Pain Assessment: No/denies pain    Home Living                          Prior Function            PT Goals (current goals can now be found in the care plan section) Progress towards PT goals: Progressing toward goals    Frequency    Min 2X/week      PT Plan Current plan remains appropriate    Co-evaluation              AM-PAC PT "6 Clicks" Mobility   Outcome Measure   Help needed turning from your back to your side while in a flat bed without using bedrails?: A Little Help needed moving from lying on your back to sitting on the side of a flat bed without using bedrails?: A Little Help needed moving to and from a bed to a chair (including a wheelchair)?: A Little Help needed standing up from a chair using your arms (e.g., wheelchair or bedside chair)?: A Little Help needed to walk in hospital room?: A Little Help needed climbing 3-5 steps with a railing? : A Little 6 Click Score: 18    End of Session Equipment Utilized During Treatment: Gait belt;Oxygen Activity Tolerance: Patient tolerated treatment well Patient left: in chair;with call bell/phone within reach Nurse Communication: Mobility status PT Visit Diagnosis: Other abnormalities of gait and mobility (R26.89);Difficulty in walking, not elsewhere classified (R26.2);Muscle weakness (generalized) (M62.81)     Time: 3212-2482 PT Time Calculation (min) (ACUTE ONLY): 26 min  Charges:  $Gait Training: 23-37 mins                   Chesley Noon, PTA 10/13/22, 11:34 AM

## 2022-10-13 NOTE — Progress Notes (Signed)
[facility] Progress Note  Patient: John [name]    HLK:[id number]  DOA: 10/02/2022     Date of Service: the patient was seen and examined on 10/13/2022  Chief Complaint  Patient presents with   Shortness of Breath   Leg Swelling    Pt. To ED via POV for generalized swelling x several weeks. Pt. Has CHF and has been in the hospital recently for pneumonia and CHF exacerbation. Pt's O2 sat in triage 80%, 2L O2 [place] applied.   Brief hospital course: Taken from prior notes.   76 year old with history of diastolic CHF, CKD stage IV, HTN, HLD, COPD, erythrocytosis, cor pulmonale admitted for lower extremity edema and weight gain.  Minimal shortness of breath but requiring 2 L nasal cannula in the ED due to hypoxia.  Patient was seen by his nephrologist Dr. Zollie Scale outpatient and was advised to come to the hospital for IV diuretics.  Chest x-ray showed cardiomegaly, nephrology team was consulted.  Creatinine slowly improving with IV diuretics.   10/21: Hemodynamically stable.  Renal function continued to improve, creatinine at 1.72 with improvement of GFR to 41.  Weight seems stable at 220, recorded output of 2600 with net negative of -5.2 L.  Remained on Lasix infusion. Echocardiogram during current hospitalization with normal EF, grade 1 diastolic dysfunction and signs of right ventricular overload.   10/22: Patient continued to have good diuresis with Lasix infusion.  Renal function continues to improve, creatinine at 1.50 today.  Still with significant volume overload.  Continue to require 4 to 5 L of oxygen, patient has home oxygen but was not using it.  Not sure about baseline.   10/23: Patient was continued on Lasix infusion, renal function continue to improve now creatinine of 1.44 and GFR of 50 which makes him CKD stage IIIa.  Stable bicarb at 39.  Remained on 5 L of oxygen but saturating 99 to 100%-we will try weaning to the minimum requirement, most likely will be going home with  oxygen.  Doubt he does not need oxygen but was discharged with oxygen during prior hospitalization.  Most likely noncompliant and need education. PT and OT are now recommending SNF. Developed right hand edema, mild erythema and hyperthermia-start him on ceftriaxone for concern of cellulitis.   10/24: Patient with slight worsening of bicarb and creatinine at 1.53.  Net negative of more than 11 L. Significant improvement in lower extremity edema.  PT/OT are recommending SNF but patient declined and wants to go home with home health services.  Discussed with nephrology and they would like to continue Lasix infusion for another 1 to 2 days before switching him to p.o. Right hand edema and pain improving. He was complaining of burning pain involving both feet, started on Neurontin-dose will need titration.   10/25: Lasix infusion was discontinued due to worsening renal function.  He will be started on torsemide 20 mg daily.  Family and patient now wants to proceed with SNF. TOC is aware.   Assessment and Plan:  * Acute on chronic diastolic CHF (congestive heart failure) ([place]) 2D echo on 01/29/2022 showed EF 55 to 60%.  Patient has leg edema, positive JVD, crackles on auscultation, elevated BNP 1103.  Patient denies shortness breath, has new oxygen requirement, not sure about baseline, as he has home oxygen but was not using it.  Volume status improving, worsening creatinine so Lasix infusion was discontinued.  Nephrology is on board 10/27 started Torsemide 20 mg daily as per Nephro Bilateral lower extremity  venous duplex negative for DVT -Daily weights -strict I/O's -Low salt diet -Fluid restriction 10/28 skipped dose of torsemide 10/29 resumed low-dose torsemide   CKD (chronic kidney disease), stage IV  Creatinine with some worsening to 1.74, Lasix infusion was discontinued -Monitor renal function -Avoid nephrotoxins Cr 1.8 --1.65 --1.9--- 1.68 fluctuating    Cellulitis Involving the  right hand, improving S/p ceftriaxone, switch to oral Augmentin for total 7-day course venous duplex of right arm negative for DVT    Myocardial injury Myocardial injury due to CHF exacerbation: Troponin level 32 --> 29.  Denies chest pain.  A1c of 6.3 and LDL of 74 -Continue aspirin.     COPD (chronic obstructive pulmonary disease) (HCC) Stable.  -As needed albuterol and Mucinex   Hypertension - IV hydralazine as needed -Continue Cozaar   Hyperkalemia Resolved. -Continue to monitor   Erythrocytosis Hemoglobin 16.7 -Follow-up with CBC   Thrombocytopenia ([place]) This is a chronic issue, etiology is not clear.  Platelet is stable, 111 (112 on 02/06/2022) -Follow-up with CBC   Body mass index is 31.06 kg/m.  Interventions:      Diet: 2 g sodium DVT Prophylaxis: Subcutaneous Lovenox   Advance goals of care discussion: Full code  Family Communication: family was NOT present at bedside, at the time of interview.  The pt provided permission to discuss medical plan with the family. Opportunity was given to ask question and all questions were answered satisfactorily.   Disposition:  Pt is from Home, admitted with volume overload, still has significant lower extremity edema, which precludes a safe discharge. Discharge to SNF, when clinically improves, and cleared by nephrology.  Most likely on Monday As per social worker patient has been accepted at [facility] and they can take the patient on Monday   Subjective: No significant overnight events, patient denies any worsening of shortness of breath, patient currently on 2 L oxygen.  Patient still has significant bilateral lower extremity edema and right forearm edema, Mild pain in the right hand.  Denies any active issues, no chest pain or palpitation, no shortness of breath  Physical Exam: General:  alert oriented to time, place, and person.  Appear in no distress, affect appropriate Eyes: PERRLA ENT: Oral Mucosa Clear,  moist  Neck: no JVD,  Cardiovascular: S1 and S2 Present, no Murmur,  Respiratory: good respiratory effort, Bilateral Air entry equal and Decreased, no Crackles, no wheezes Abdomen: Bowel Sound present, Soft and no tenderness,  Skin: no rashes Extremities: 3+ Pedal edema, no calf tenderness, right Hand and forearm edema gradually improving Neurologic: without any new focal findings Gait not checked due to patient safety concerns  Vitals:   10/13/22 0756 10/13/22 0852 10/13/22 1126 10/13/22 1126  BP: 120/69   (!) 108/59  Pulse: 79   80  Resp: 19   19  Temp: 97.9 F (36.6 C)   98.1 F (36.7 C)  TempSrc:      SpO2: 90% 94% 90% 96%  Weight:      Height:        Intake/Output Summary (Last 24 hours) at 10/13/2022 1339 Last data filed at 10/13/2022 1124 Gross per 24 hour  Intake 400 ml  Output 1300 ml  Net -900 ml   Filed Weights   10/11/22 0414 10/12/22 0447 10/13/22 0548  Weight: 98.1 kg 98.1 kg 97.5 kg    Data Reviewed: I have personally reviewed and interpreted daily labs, tele strips, imagings as discussed above. I reviewed all nursing notes, pharmacy notes, vitals, pertinent  old records I have discussed plan of care as described above with RN and patient/family.  CBC: Recent Labs  Lab 10/09/22 0451 10/11/22 0542 10/12/22 0441 10/13/22 0432  WBC 6.2 5.6 5.5 6.2  HGB 15.7 15.6 15.5 16.2  HCT 51.9 51.1 50.8 54.3*  MCV 105.7* 103.2* 103.5* 104.8*  PLT 91* 108* 112* 767*   Basic Metabolic Panel: Recent Labs  Lab 10/09/22 0451 10/10/22 0551 10/11/22 0542 10/12/22 0441 10/13/22 0432  NA 141 140 139 139 139  K 3.3* 3.7 3.5 3.5 3.9  CL 90* 90* 90* 90* 90*  CO2 42* 41* 40* 42* 40*  GLUCOSE 107* 107* 101* 97 106*  BUN 24* 35* 32* 33* 32*  CREATININE 1.74* 1.81* 1.65* 1.91* 1.68*  CALCIUM 8.8* 8.7* 8.8* 9.0 9.1  MG 2.1 2.1 2.3 2.4 2.7*  PHOS  --   --  2.8 2.8 3.6    Studies: No results found.  Scheduled Meds:  aspirin EC  81 mg Oral Daily   cephALEXin   500 mg Oral Q6H   enoxaparin (LOVENOX) injection  40 mg Subcutaneous Q24H   gabapentin  100 mg Oral BID   midodrine  5 mg Oral TID WC   pantoprazole  20 mg Oral Daily   torsemide  20 mg Oral Daily   Continuous Infusions: PRN Meds: acetaminophen, dextromethorphan-guaiFENesin, hydrALAZINE, ipratropium-albuterol, metoprolol tartrate, ondansetron (ZOFRAN) IV, mouth rinse, polyethylene glycol, senna-docusate, traZODone  Time spent: 35 minutes  Author: Val Riles. MD [facility] 10/13/2022 1:39 PM  To reach On-call, see care teams to locate the attending and reach out to them via [email or web address]. If 7PM-7AM, please contact night-coverage If you still have difficulty reaching the attending provider, please page the [facility] (Director on Call) for [facility] on amion for assistance.

## 2022-10-13 NOTE — Progress Notes (Addendum)
[name]  PROGRESS NOTE   Subjective:   Patient seen sitting up  Alert and oriented Appetite appropriate On room air when seen today Lower extremity edema greatly improved Awaiting rehab placement   Urine output 1775 mL in preceding 24 hours   Objective:  Vital signs: Blood pressure 120/69, pulse 79, temperature 97.9 F (36.6 C), resp. rate 19, height '5\' 10"'$  (1.778 m), weight 97.5 kg, SpO2 94 %.  Intake/Output Summary (Last 24 hours) at 10/13/2022 1101 Last data filed at 10/13/2022 0738 Gross per 24 hour  Intake 400 ml  Output 1600 ml  Net -1200 ml    Filed Weights   10/11/22 0414 10/12/22 0447 10/13/22 0548  Weight: 98.1 kg 98.1 kg 97.5 kg     Physical Exam: General:  No acute distress  Head:  Normocephalic, atraumatic. Moist oral mucosal membranes  Eyes:  Anicteric  Lungs:   Clear to auscultation, normal effort  Heart:  S1S2 no rubs  Abdomen:   Soft, nontender, bowel sounds present  Extremities: Tight trace to 1+ peripheral edema.  Neurologic:  Awake, alert, following commands  Skin:  No lesions  Access: None    Basic Metabolic Panel: Recent Labs  Lab 10/09/22 0451 10/10/22 0551 10/11/22 0542 10/12/22 0441 10/13/22 0432  NA 141 140 139 139 139  K 3.3* 3.7 3.5 3.5 3.9  CL 90* 90* 90* 90* 90*  CO2 42* 41* 40* 42* 40*  GLUCOSE 107* 107* 101* 97 106*  BUN 24* 35* 32* 33* 32*  CREATININE 1.74* 1.81* 1.65* 1.91* 1.68*  CALCIUM 8.8* 8.7* 8.8* 9.0 9.1  MG 2.1 2.1 2.3 2.4 2.7*  PHOS  --   --  2.8 2.8 3.6     CBC: Recent Labs  Lab 10/09/22 0451 10/11/22 0542 10/12/22 0441 10/13/22 0432  WBC 6.2 5.6 5.5 6.2  HGB 15.7 15.6 15.5 16.2  HCT 51.9 51.1 50.8 54.3*  MCV 105.7* 103.2* 103.5* 104.8*  PLT 91* 108* 112* 138*      Urinalysis: No results for input(s): "COLORURINE", "LABSPEC", "PHURINE", "GLUCOSEU", "HGBUR", "BILIRUBINUR", "KETONESUR", "PROTEINUR", "UROBILINOGEN", "NITRITE", "LEUKOCYTESUR" in the last 72 hours.  Invalid  input(s): "APPERANCEUR"    Imaging: No results found.   Medications:      aspirin EC  81 mg Oral Daily   cephALEXin  500 mg Oral Q6H   enoxaparin (LOVENOX) injection  40 mg Subcutaneous Q24H   gabapentin  100 mg Oral BID   midodrine  5 mg Oral TID WC   pantoprazole  20 mg Oral Daily   torsemide  20 mg Oral Daily    Assessment/ Plan:     Principal Problem:   Acute on chronic diastolic CHF (congestive heart failure) (HCC) Active Problems:   COPD (chronic obstructive pulmonary disease) (HCC)   Hypertension   Erythrocytosis   Thrombocytopenia (HCC)   CKD (chronic kidney disease), stage IV (HCC)   Myocardial injury   Hyperkalemia   Cellulitis  John [name] is a 76 year old male with past medical conditions including COPD, hyperlipidemia, hypertension, CHF, and chronic kidney disease stage IV.  Patient presents to the emergency room at the advice of nephrologist for evaluation of progressive lower extremity edema.  Patient has been admitted for Acute on chronic diastolic congestive heart failure    #1: AKI on chronic kidney disease stage IV: CKD is most likely secondary to chronic hypertensive nephrosclerosis versus ischemic renal disease due to decreased cardiac output.  Patient creatinine has improved slightly to 1.7 from 1.9. midodrine was added  yesterday to achieve higher blood pressure.   #2:  Diastolic congestive heart failure and lower extremity edema: ECHO from 10/02/22 shows EF 50-55% with mild LVH and a grade 1 diastolic dysfunction. Patient is advised on fluid restriction.  Torsemide restarted at low dose   #3: Hypertension with chronic kidney disease:   Started midodrine 5 mg tid yesterday. BP 120/69    #4: Second hyperparathyroidism: We will resume calcitriol on discharge.  Calcium remains acceptable.  #5: Hypokalemia: Potassium stable, 3.9  6. Constipation, resolved with bowel regimen   LOS: [facility] 10/29/202311:01 AM  Patient was examined and evaluated with Zonia Kief, PA.  Plan of care was formulated and discussed with patient as well as PA.  I agree with the note as documented with edits.

## 2022-10-14 DIAGNOSIS — I5033 Acute on chronic diastolic (congestive) heart failure: Secondary | ICD-10-CM | POA: Diagnosis not present

## 2022-10-14 NOTE — Progress Notes (Signed)
PROGRESS NOTE    John [name]  ENI:[id number] DOB: 09/16/1946 DOA: 10/02/2022 PCP: Tracie Harrier, MD    Brief Narrative:   John [name] is a 76 y.o. male with past medical history significant for chronic diastolic congestive heart failure, CKD stage IV, essential hypertension, hyperlipidemia, COPD, thrombocytopenia presented to [facility] on 10/18 with progressive lower extremity edema and weight gain.  Also reports progressive shortness of breath.  He was seen in the outpatient nephrology office with Dr. Zollie Scale who recommended to present to the ED for IV diuretics and further evaluation.  Patient was noted to be hypoxic requiring 2 L nasal cannula on arrival.  Nephrology was consulted.  TRH consulted for admission and further evaluation and management.  Assessment & Plan:   Acute on chronic diastolic congestive heart failure Patient presenting to ED with progressive weight gain, lower extremity edema associate with shortness of breath in which she was noted to be hypoxic requiring 2 L nasal cannula.  BNP elevated 1103.  Nephrology was consulted and patient was initially started on a Lasix drip.  Patient's oxygen requirements peaked at 5 L nasal cannula and slowly improved during hospitalization.  Lasix infusion was discontinued on 10/25 and transition to torsemide. -- Nephrology following, appreciate assistance -- net negative 16L since admission -- wt 100.7>101>>>97.4kg -- Torsemide 20 mg p.o. daily  CKD stage IV Follows with nephrology outpatient, Dr. Zollie Scale.  Creatinine 1.68 yesterday, stable.  Refusing lab draw this morning.  Continues on diuretic as above.  Discontinued home losartan.  Cellulitis, right hand Started on ceftriaxone initially and transition to Augmentin in which she completed 7-day course.  Hx essential hypertension Patient was borderline hypotensive during hospitalization, possible cardiorenal syndrome and patient was started on midodrine  5 mg p.o. 3 times daily.  Discontinued home losartan.  GERD: Continue PPI  Weakness/debility/gait disturbance/deconditioning: -- Continue therapy efforts while inpatient -- Plan to discharge to [facility] once nephrology signed off  DVT prophylaxis: enoxaparin (LOVENOX) injection 40 mg Start: 10/03/22 2200    Code Status: Full Code Family Communication: No family present at bedside this morning  Disposition Plan:  Level of care: Med-Surg Status is: Inpatient Remains inpatient appropriate because: Awaiting nephrology to sign off    Consultants:  Nephrology  Procedures:  TTE  Antimicrobials:  Ceftriaxone Augmentin   Subjective: Patient seen examined bedside, resting comfortably.  No specific complaints this morning.  Awaiting nephrology to sign off with finalized recommendations before discharging to SNF.  No other specific questions or concerns at this time.  Denies headache, no dizziness, no chest pain, no palpitations, no shortness of breath, no abdominal pain, no fever, no chills, no night sweats, no abdominal pain, no focal weakness, no fatigue, no paresthesias.  No acute events overnight per nurse staff.  Patient refusing lab draw this morning.  Objective: Vitals:   10/14/22 0351 10/14/22 0523 10/14/22 0750 10/14/22 1317  BP: 107/84  110/78 94/69  Pulse: 78  78 78  Resp: (!) '21  18 14  '$ Temp: 97.6 F (36.4 C)  (!) 97.5 F (36.4 C) 97.6 F (36.4 C)  TempSrc:    Axillary  SpO2: 93%  94% 97%  Weight:  97.4 kg    Height:        Intake/Output Summary (Last 24 hours) at 10/14/2022 1422 Last data filed at 10/14/2022 1353 Gross per 24 hour  Intake 600 ml  Output 1275 ml  Net -675 ml   Filed Weights   10/12/22 0447 10/13/22 0548  10/14/22 0523  Weight: 98.1 kg 97.5 kg 97.4 kg    Examination:  Physical Exam: GEN: NAD, alert and oriented x 3, chronically ill in appearance, appears older than stated age 42: NCAT, PERRL, EOMI, sclera clear, MMM PULM:  CTAB w/o wheezes/crackles, normal respiratory effort, on 2 L nasal cannula CV: RRR w/o M/G/R GI: abd soft, NTND, NABS, no R/G/M MSK: Bilateral lower extremity peripheral edema, moves all extremities independently NEURO: Nonfocal PSYCH: normal mood/affect Integumentary: dry/intact, no rashes or wounds    Data Reviewed: I have personally reviewed following labs and imaging studies  CBC: Recent Labs  Lab 10/09/22 0451 10/11/22 0542 10/12/22 0441 10/13/22 0432  WBC 6.2 5.6 5.5 6.2  HGB 15.7 15.6 15.5 16.2  HCT 51.9 51.1 50.8 54.3*  MCV 105.7* 103.2* 103.5* 104.8*  PLT 91* 108* 112* 347*   Basic Metabolic Panel: Recent Labs  Lab 10/09/22 0451 10/10/22 0551 10/11/22 0542 10/12/22 0441 10/13/22 0432  NA 141 140 139 139 139  K 3.3* 3.7 3.5 3.5 3.9  CL 90* 90* 90* 90* 90*  CO2 42* 41* 40* 42* 40*  GLUCOSE 107* 107* 101* 97 106*  BUN 24* 35* 32* 33* 32*  CREATININE 1.74* 1.81* 1.65* 1.91* 1.68*  CALCIUM 8.8* 8.7* 8.8* 9.0 9.1  MG 2.1 2.1 2.3 2.4 2.7*  PHOS  --   --  2.8 2.8 3.6   GFR: Estimated Creatinine Clearance: 43.8 mL/min (A) (by C-G formula based on SCr of 1.68 mg/dL (H)). Liver Function Tests: No results for input(s): "AST", "ALT", "ALKPHOS", "BILITOT", "PROT", "ALBUMIN" in the last 168 hours. No results for input(s): "LIPASE", "AMYLASE" in the last 168 hours. No results for input(s): "AMMONIA" in the last 168 hours. Coagulation Profile: No results for input(s): "INR", "PROTIME" in the last 168 hours. Cardiac Enzymes: No results for input(s): "CKTOTAL", "CKMB", "CKMBINDEX", "TROPONINI" in the last 168 hours. BNP (last 3 results) No results for input(s): "PROBNP" in the last 8760 hours. HbA1C: No results for input(s): "HGBA1C" in the last 72 hours. CBG: No results for input(s): "GLUCAP" in the last 168 hours. Lipid Profile: No results for input(s): "CHOL", "HDL", "LDLCALC", "TRIG", "CHOLHDL", "LDLDIRECT" in the last 72 hours. Thyroid Function Tests: No  results for input(s): "TSH", "T4TOTAL", "FREET4", "T3FREE", "THYROIDAB" in the last 72 hours. Anemia Panel: No results for input(s): "VITAMINB12", "FOLATE", "FERRITIN", "TIBC", "IRON", "RETICCTPCT" in the last 72 hours. Sepsis Labs: No results for input(s): "PROCALCITON", "LATICACIDVEN" in the last 168 hours.  No results found for this or any previous visit (from the past 240 hour(s)).       Radiology Studies: No results found.      Scheduled Meds:  aspirin EC  81 mg Oral Daily   enoxaparin (LOVENOX) injection  40 mg Subcutaneous Q24H   gabapentin  100 mg Oral BID   midodrine  5 mg Oral TID WC   pantoprazole  20 mg Oral Daily   torsemide  20 mg Oral Daily   Continuous Infusions:   LOS: 12 days    Time spent: 52 minutes spent on chart review, discussion with nursing staff, consultants, updating family and interview/physical exam; more than 50% of that time was spent in counseling and/or coordination of care.    Avaline Stillson J [place] DO [facility] Available via Epic secure chat 7am-7pm After these hours, please refer to coverage provider listed on [email or web address] 10/14/2022, 2:22 PM

## 2022-10-14 NOTE — Progress Notes (Signed)
SATURATION QUALIFICATIONS: (This note is used to comply with regulatory documentation for home oxygen)  Patient Saturations on Room Air at Rest = 89%  Patient Saturations on Room Air while Ambulating = 86%  Patient Saturations on 2 Liters of oxygen while Ambulating = 91%  Please briefly explain why patient needs home oxygen: Patient's sats drop at rest and with ambulation while on room air.

## 2022-10-14 NOTE — Progress Notes (Signed)
[name]  PROGRESS NOTE   Subjective:   Patient seen resting in bed, partially completed breakfast tray at bedside Alert with mild confusion Remains on 2 L nasal cannula Lower extremity edema improved  Urine output 800 mL in preceding 24 hours   Objective:  Vital signs: Blood pressure 94/69, pulse 78, temperature 97.6 F (36.4 C), temperature source Axillary, resp. rate 14, height '5\' 10"'$  (1.778 m), weight 97.4 kg, SpO2 97 %.  Intake/Output Summary (Last 24 hours) at 10/14/2022 1447 Last data filed at 10/14/2022 1353 Gross per 24 hour  Intake 600 ml  Output 1275 ml  Net -675 ml    Filed Weights   10/12/22 0447 10/13/22 0548 10/14/22 0523  Weight: 98.1 kg 97.5 kg 97.4 kg     Physical Exam: General:  No acute distress  Head:  Normocephalic, atraumatic. Moist oral mucosal membranes  Eyes:  Anicteric  Lungs:   Clear to auscultation, normal effort  Heart:  S1S2 no rubs  Abdomen:   Soft, nontender, bowel sounds present  Extremities: Tight trace to 1+ peripheral edema.  Neurologic:  Awake, alert, following commands  Skin:  No lesions  Access: None    Basic Metabolic Panel: Recent Labs  Lab 10/09/22 0451 10/10/22 0551 10/11/22 0542 10/12/22 0441 10/13/22 0432  NA 141 140 139 139 139  K 3.3* 3.7 3.5 3.5 3.9  CL 90* 90* 90* 90* 90*  CO2 42* 41* 40* 42* 40*  GLUCOSE 107* 107* 101* 97 106*  BUN 24* 35* 32* 33* 32*  CREATININE 1.74* 1.81* 1.65* 1.91* 1.68*  CALCIUM 8.8* 8.7* 8.8* 9.0 9.1  MG 2.1 2.1 2.3 2.4 2.7*  PHOS  --   --  2.8 2.8 3.6     CBC: Recent Labs  Lab 10/09/22 0451 10/11/22 0542 10/12/22 0441 10/13/22 0432  WBC 6.2 5.6 5.5 6.2  HGB 15.7 15.6 15.5 16.2  HCT 51.9 51.1 50.8 54.3*  MCV 105.7* 103.2* 103.5* 104.8*  PLT 91* 108* 112* 138*      Urinalysis: No results for input(s): "COLORURINE", "LABSPEC", "PHURINE", "GLUCOSEU", "HGBUR", "BILIRUBINUR", "KETONESUR", "PROTEINUR", "UROBILINOGEN", "NITRITE", "LEUKOCYTESUR" in the  last 72 hours.  Invalid input(s): "APPERANCEUR"    Imaging: No results found.   Medications:      aspirin EC  81 mg Oral Daily   enoxaparin (LOVENOX) injection  40 mg Subcutaneous Q24H   gabapentin  100 mg Oral BID   midodrine  5 mg Oral TID WC   pantoprazole  20 mg Oral Daily   torsemide  20 mg Oral Daily    Assessment/ Plan:     Principal Problem:   Acute on chronic diastolic CHF (congestive heart failure) (HCC) Active Problems:   COPD (chronic obstructive pulmonary disease) (HCC)   Hypertension   Erythrocytosis   Thrombocytopenia (HCC)   CKD (chronic kidney disease), stage IV (HCC)   Myocardial injury   Hyperkalemia   Cellulitis  John [name] is a 76 year old male with past medical conditions including COPD, hyperlipidemia, hypertension, CHF, and chronic kidney disease stage IV.  Patient presents to the emergency room at the advice of nephrologist for evaluation of progressive lower extremity edema.  Patient has been admitted for Acute on chronic diastolic congestive heart failure    #1: AKI on chronic kidney disease stage IV: CKD is most likely secondary to chronic hypertensive nephrosclerosis versus ischemic renal disease due to decreased cardiac output.    Renal function remains stable with adequate urine output recorded.  Patient receiving midodrine to maintain  blood pressure.  Once discharged, patient will follow-up with nephrology in 2 weeks.   #2:  Diastolic congestive heart failure and lower extremity edema: ECHO from 10/02/22 shows EF 50-55% with mild LVH and a grade 1 diastolic dysfunction. Patient is advised on fluid restriction.  Torsemide 20 mg daily.   #3: Hypertension with chronic kidney disease:  BP 94/69, continue midodrine   #4: Second hyperparathyroidism: We will resume calcitriol on discharge.  We will continue to monitor bone minerals during this admission.  #5: Hypokalemia: Potassium remains stable  6. Constipation, resolved    LOS:  [facility] 10/30/20232:47 PM

## 2022-10-14 NOTE — Care Management Important Message (Signed)
Important Message  Patient Details  Name: John [name] MRN: [id number] Date of Birth: Apr 03, 1946   Medicare Important Message Given:  Yes     [name] 10/14/2022, 12:26 PM

## 2022-10-15 DIAGNOSIS — R5381 Other malaise: Secondary | ICD-10-CM | POA: Diagnosis not present

## 2022-10-15 DIAGNOSIS — E875 Hyperkalemia: Secondary | ICD-10-CM | POA: Diagnosis not present

## 2022-10-15 DIAGNOSIS — R54 Age-related physical debility: Secondary | ICD-10-CM | POA: Diagnosis not present

## 2022-10-15 DIAGNOSIS — N2581 Secondary hyperparathyroidism of renal origin: Secondary | ICD-10-CM | POA: Diagnosis not present

## 2022-10-15 DIAGNOSIS — I5A Non-ischemic myocardial injury (non-traumatic): Secondary | ICD-10-CM | POA: Diagnosis not present

## 2022-10-15 DIAGNOSIS — I1310 Hypertensive heart and chronic kidney disease without heart failure, with stage 1 through stage 4 chronic kidney disease, or unspecified chronic kidney disease: Secondary | ICD-10-CM | POA: Diagnosis not present

## 2022-10-15 DIAGNOSIS — Z87891 Personal history of nicotine dependence: Secondary | ICD-10-CM | POA: Diagnosis not present

## 2022-10-15 DIAGNOSIS — Z7401 Bed confinement status: Secondary | ICD-10-CM | POA: Diagnosis not present

## 2022-10-15 DIAGNOSIS — Z9981 Dependence on supplemental oxygen: Secondary | ICD-10-CM | POA: Diagnosis not present

## 2022-10-15 DIAGNOSIS — I5033 Acute on chronic diastolic (congestive) heart failure: Secondary | ICD-10-CM | POA: Diagnosis not present

## 2022-10-15 DIAGNOSIS — G603 Idiopathic progressive neuropathy: Secondary | ICD-10-CM | POA: Diagnosis not present

## 2022-10-15 DIAGNOSIS — I130 Hypertensive heart and chronic kidney disease with heart failure and stage 1 through stage 4 chronic kidney disease, or unspecified chronic kidney disease: Secondary | ICD-10-CM | POA: Diagnosis not present

## 2022-10-15 DIAGNOSIS — N184 Chronic kidney disease, stage 4 (severe): Secondary | ICD-10-CM | POA: Diagnosis not present

## 2022-10-15 DIAGNOSIS — I5032 Chronic diastolic (congestive) heart failure: Secondary | ICD-10-CM | POA: Diagnosis not present

## 2022-10-15 DIAGNOSIS — K219 Gastro-esophageal reflux disease without esophagitis: Secondary | ICD-10-CM | POA: Diagnosis not present

## 2022-10-15 DIAGNOSIS — R29898 Other symptoms and signs involving the musculoskeletal system: Secondary | ICD-10-CM | POA: Diagnosis not present

## 2022-10-15 DIAGNOSIS — J449 Chronic obstructive pulmonary disease, unspecified: Secondary | ICD-10-CM | POA: Diagnosis not present

## 2022-10-15 DIAGNOSIS — N179 Acute kidney failure, unspecified: Secondary | ICD-10-CM | POA: Diagnosis not present

## 2022-10-15 DIAGNOSIS — L03115 Cellulitis of right lower limb: Secondary | ICD-10-CM | POA: Diagnosis not present

## 2022-10-15 DIAGNOSIS — I5043 Acute on chronic combined systolic (congestive) and diastolic (congestive) heart failure: Secondary | ICD-10-CM | POA: Diagnosis not present

## 2022-10-15 DIAGNOSIS — L03116 Cellulitis of left lower limb: Secondary | ICD-10-CM | POA: Diagnosis not present

## 2022-10-15 DIAGNOSIS — D696 Thrombocytopenia, unspecified: Secondary | ICD-10-CM | POA: Diagnosis not present

## 2022-10-15 LAB — BASIC METABOLIC PANEL
Anion gap: 7 (ref 5–15)
BUN: 27 mg/dL — ABNORMAL HIGH (ref 8–23)
CO2: 41 mmol/L — ABNORMAL HIGH (ref 22–32)
Calcium: 9 mg/dL (ref 8.9–10.3)
Chloride: 93 mmol/L — ABNORMAL LOW (ref 98–111)
Creatinine, Ser: 1.67 mg/dL — ABNORMAL HIGH (ref 0.61–1.24)
GFR, Estimated: 42 mL/min — ABNORMAL LOW (ref >60)
Glucose, Bld: 94 mg/dL (ref 70–99)
Potassium: 3.9 mmol/L (ref 3.5–5.1)
Sodium: 141 mmol/L (ref 135–145)

## 2022-10-15 MED ORDER — PANTOPRAZOLE SODIUM 20 MG PO TBEC: 20.0000 mg | DELAYED_RELEASE_TABLET | Freq: Every day | ORAL | Status: DC

## 2022-10-15 MED ORDER — METOLAZONE 2.5 MG PO TABS
2.5000 mg | ORAL_TABLET | ORAL | Status: DC
  Administered 2022-10-15: 2.5 mg via ORAL
  Filled 2022-10-15: qty 1

## 2022-10-15 MED ORDER — TORSEMIDE 20 MG PO TABS: 20.0000 mg | ORAL_TABLET | Freq: Every day | ORAL | Status: DC

## 2022-10-15 MED ORDER — MIDODRINE HCL 5 MG PO TABS: 5.0000 mg | ORAL_TABLET | Freq: Three times a day (TID) | ORAL | Status: DC

## 2022-10-15 MED ORDER — GABAPENTIN 100 MG PO CAPS: 100.0000 mg | ORAL_CAPSULE | Freq: Two times a day (BID) | ORAL | Status: DC

## 2022-10-15 MED ORDER — METOLAZONE 2.5 MG PO TABS: 2.5000 mg | ORAL_TABLET | ORAL | Status: DC

## 2022-10-15 NOTE — Progress Notes (Signed)
   Heart Failure Nurse Navigator Note   Met with patient today, he was sitting up on the edge of the bed in no acute distress.  Patient's nurse had asked that I give him another education packet as his one that was given previously had become wet and disposed of.  States that he has 2 education books at home on heart failure that he has read in the past and did not feel that would be any new information in this 1.  By teach back method went over fluid restriction, sodium restriction, weight gains to report, he did not need any reinforcement.  Could tell me that he should not drink any more than 8-8 ounce cups in a days time of any liquid and that Dr. Holley Raring had told him that he probably should only be doing 6.  The patient states that he is going to try to stick with that limit.  He had no further questions, he has been discharged to SNF.  Pricilla Riffle RN [facility]

## 2022-10-15 NOTE — Progress Notes (Signed)
[name]  PROGRESS NOTE   Subjective:   Patient seen resting in bed, alert and oriented Appetite continues to improve Remains on 2 L nasal cannula Lower extremity edema greatly improved  Creatinine 1.67 Urine output 1.775 L in preceding 24 hours  Objective:  Vital signs: Blood pressure 111/76, pulse 66, temperature 98 F (36.7 C), temperature source Oral, resp. rate 17, height '5\' 10"'$  (1.778 m), weight 97.9 kg, SpO2 96 %.  Intake/Output Summary (Last 24 hours) at 10/15/2022 1115 Last data filed at 10/15/2022 1027 Gross per 24 hour  Intake 600 ml  Output 1625 ml  Net -1025 ml    Filed Weights   10/13/22 0548 10/14/22 0523 10/15/22 0024  Weight: 97.5 kg 97.4 kg 97.9 kg     Physical Exam: General:  No acute distress  Head:  Normocephalic, atraumatic. Moist oral mucosal membranes  Eyes:  Anicteric  Lungs:   Clear to auscultation, normal effort  Heart:  S1S2 no rubs  Abdomen:   Soft, nontender, bowel sounds present  Extremities: Tight trace to 1+ peripheral edema.  Neurologic:  Awake, alert, following commands  Skin:  No lesions  Access: None    Basic Metabolic Panel: Recent Labs  Lab 10/09/22 0451 10/10/22 0551 10/11/22 0542 10/12/22 0441 10/13/22 0432 10/15/22 0523  NA 141 140 139 139 139 141  K 3.3* 3.7 3.5 3.5 3.9 3.9  CL 90* 90* 90* 90* 90* 93*  CO2 42* 41* 40* 42* 40* 41*  GLUCOSE 107* 107* 101* 97 106* 94  BUN 24* 35* 32* 33* 32* 27*  CREATININE 1.74* 1.81* 1.65* 1.91* 1.68* 1.67*  CALCIUM 8.8* 8.7* 8.8* 9.0 9.1 9.0  MG 2.1 2.1 2.3 2.4 2.7*  --   PHOS  --   --  2.8 2.8 3.6  --      CBC: Recent Labs  Lab 10/09/22 0451 10/11/22 0542 10/12/22 0441 10/13/22 0432  WBC 6.2 5.6 5.5 6.2  HGB 15.7 15.6 15.5 16.2  HCT 51.9 51.1 50.8 54.3*  MCV 105.7* 103.2* 103.5* 104.8*  PLT 91* 108* 112* 138*      Urinalysis: No results for input(s): "COLORURINE", "LABSPEC", "PHURINE", "GLUCOSEU", "HGBUR", "BILIRUBINUR", "KETONESUR",  "PROTEINUR", "UROBILINOGEN", "NITRITE", "LEUKOCYTESUR" in the last 72 hours.  Invalid input(s): "APPERANCEUR"    Imaging: No results found.   Medications:      aspirin EC  81 mg Oral Daily   enoxaparin (LOVENOX) injection  40 mg Subcutaneous Q24H   gabapentin  100 mg Oral BID   metolazone  2.5 mg Oral Once per day on Mon Thu   midodrine  5 mg Oral TID WC   pantoprazole  20 mg Oral Daily   torsemide  20 mg Oral Daily    Assessment/ Plan:     Principal Problem:   Acute on chronic diastolic CHF (congestive heart failure) (HCC) Active Problems:   COPD (chronic obstructive pulmonary disease) (HCC)   Hypertension   Erythrocytosis   Thrombocytopenia (HCC)   CKD (chronic kidney disease), stage IV (HCC)   Myocardial injury   Hyperkalemia   Cellulitis  John [name] is a 76 year old male with past medical conditions including COPD, hyperlipidemia, hypertension, CHF, and chronic kidney disease stage IV.  Patient presents to the emergency room at the advice of nephrologist for evaluation of progressive lower extremity edema.  Patient has been admitted for Acute on chronic diastolic congestive heart failure    #1: AKI on chronic kidney disease stage IV: CKD is most likely secondary to chronic hypertensive  nephrosclerosis versus ischemic renal disease due to decreased cardiac output.    Creatinine appears to have stabilized.  Adequate urine output on current diuretic regimen.   #2:  Diastolic congestive heart failure and lower extremity edema: ECHO from 10/02/22 shows EF 50-55% with mild LVH and a grade 1 diastolic dysfunction.  Patient will be discharged on torsemide 20 mg daily and metolazone 2.5 mg twice weekly.  Patient encouraged to maintain fluid restriction.   #3: Hypertension with chronic kidney disease:  BP 111/76, continue midodrine   #4: Second hyperparathyroidism: We will resume calcitriol on discharge.  Calcium remains stable  #5: Hypokalemia: Resolved  6.  Constipation, resolved    LOS: [facility] 10/31/202311:15 AM

## 2022-10-15 NOTE — TOC Transition Note (Signed)
Transition of Care [facility]) - CM/SW Discharge Note   Patient Details  Name: John [name] MRN: [id number] Date of Birth: 01/12/1946  [facility] CM/SW Contact:  Alberteen Sam, LCSW Phone Number: 10/15/2022, 10:47 AM   Clinical Narrative:     Patient will DC YT:[facility] Anticipated DC date: 10/15/22 Family notified:friend [name] Transport OL:IDCVU  Per MD patient ready for DC to [facility] . RN, patient, patient's family, and facility notified of DC. Discharge Summary sent to facility. RN given number for report  [phone] Room 510. DC packet on chart. Ambulance transport requested for patient.  CSW signing off.  Pricilla Riffle, LCSW   Final next level of care: Skilled Nursing Facility Barriers to Discharge: No Barriers Identified   Patient Goals and CMS Choice Patient states their goals for this hospitalization and ongoing recovery are:: to go home CMS Medicare.gov Compare Post Acute Care list provided to:: Patient Choice offered to / list presented to : Patient  Discharge Placement              Patient chooses bed at: [facility] Patient to be transferred to facility by: [facility]   Patient and family notified of of transfer: 10/15/22  Discharge Plan and Services In-house Referral: Clinical Social Work   Post Acute Care Choice: Home Health                    HH Arranged: RN, PT, OT [facility] Agency: [place] Date [place]: 10/06/22 Time [place]: 1508 Representative spoke with at [place]: cory  Social Determinants of Health (SDOH) Interventions     Readmission Risk Interventions     No data to display

## 2022-10-15 NOTE — Discharge Summary (Signed)
Physician Discharge Summary  John [name] WUJ:[id number] DOB: [birth year] DOA: 10/02/2022  PCP: Tracie Harrier, MD  Admit date: 10/02/2022 Discharge date: 10/15/2022  Admitted From: Home Disposition: [facility]  Recommendations for Outpatient Follow-up:  Follow up with PCP in 1-2 weeks Follow-up with nephrology, Dr. Zollie Scale as scheduled Torsemide dose changed to 20 mg p.o. daily Started on metolazone twice weekly Please obtain BMP/CBC in one week  Discharge Condition: Stable CODE STATUS: Full code Diet recommendation: Heart healthy diet, 2 g sodium restriction with 1.5 L fluid restriction  History of present illness:  John [name] is a 76 y.o. male with past medical history significant for chronic diastolic congestive heart failure, CKD stage IV, essential hypertension, hyperlipidemia, COPD, thrombocytopenia presented to [facility] on 10/18 with progressive lower extremity edema and weight gain.  Also reports progressive shortness of breath.  He was seen in the outpatient nephrology office with Dr. Zollie Scale who recommended to present to the ED for IV diuretics and further evaluation.  Patient was noted to be hypoxic requiring 2 L nasal cannula on arrival.  Nephrology was consulted.  TRH consulted for admission and further evaluation and management.  Hospital course:  Acute on chronic diastolic congestive heart failure Patient presenting to ED with progressive weight gain, lower extremity edema associate with shortness of breath in which she was noted to be hypoxic requiring 2 L nasal cannula.  BNP elevated 1103.  Nephrology was consulted and patient was initially started on a Lasix drip.  Patient's oxygen requirements peaked at 5 L nasal cannula and slowly improved during hospitalization.  Lasix infusion was discontinued on 10/25 and transition to torsemide.  Continue torsemide 10 mg p.o. daily, metolazone 2.5 mg p.o. twice weekly.  Recommend repeat BMP 1  week.  Outpatient follow-up with nephrology as scheduled.   CKD stage IV Follows with nephrology outpatient, Dr. Holley Raring.  Creatinine 1.67 on time of discharge, stable. Continues on diuretic as above.  Discontinued home losartan.  After follow-up with nephrology.   Cellulitis, right hand Started on ceftriaxone initially and transition to Augmentin in which he completed 7-day course.   Hx essential hypertension Patient was borderline hypotensive during hospitalization, possible cardiorenal syndrome and patient was started on midodrine 5 mg p.o. 3 times daily.  Discontinued home losartan.   GERD: Continue PPI   Weakness/debility/gait disturbance/deconditioning: Discharging to [facility] once nephrology signed off  Discharge Diagnoses:  Principal Problem:   Acute on chronic diastolic CHF (congestive heart failure) ([place]) Active Problems:   CKD (chronic kidney disease), stage IV (HCC)   Cellulitis   Myocardial injury   COPD (chronic obstructive pulmonary disease) (HCC)   Hypertension   Hyperkalemia   Erythrocytosis   Thrombocytopenia ([place])    Discharge Instructions  Discharge Instructions     Call MD for:  difficulty breathing, headache or visual disturbances   Complete by: As directed    Call MD for:  extreme fatigue   Complete by: As directed    Call MD for:  persistant dizziness or light-headedness   Complete by: As directed    Call MD for:  persistant nausea and vomiting   Complete by: As directed    Call MD for:  severe uncontrolled pain   Complete by: As directed    Call MD for:  temperature >100.4   Complete by: As directed    Diet - low sodium heart healthy   Complete by: As directed    Increase activity slowly   Complete by: As  directed       Allergies as of 10/15/2022   No Known Allergies      Medication List     STOP taking these medications    losartan 50 MG tablet Commonly known as: COZAAR   potassium chloride SA 20 MEQ  tablet Commonly known as: KLOR-CON M       TAKE these medications    aspirin EC 81 MG tablet Take 1 tablet (81 mg total) by mouth daily. Swallow whole.   calcitRIOL 0.25 MCG capsule Commonly known as: ROCALTROL Take 0.25 mcg by mouth daily.   gabapentin 100 MG capsule Commonly known as: NEURONTIN Take 1 capsule (100 mg total) by mouth 2 (two) times daily.   metolazone 2.5 MG tablet Commonly known as: ZAROXOLYN Take 1 tablet (2.5 mg total) by mouth 2 (two) times a week.   midodrine 5 MG tablet Commonly known as: PROAMATINE Take 1 tablet (5 mg total) by mouth 3 (three) times daily with meals.   pantoprazole 20 MG tablet Commonly known as: PROTONIX Take 1 tablet (20 mg total) by mouth daily.   torsemide 20 MG tablet Commonly known as: DEMADEX Take 1 tablet (20 mg total) by mouth daily. What changed:  medication strength how much to take additional instructions        Contact information for follow-up providers     Tracie Harrier, MD. Schedule an appointment as soon as possible for a visit in 1 week(s).   Specialty: Internal Medicine Contact information: [place] [address] [phone]         Anthonette Legato, MD. Schedule an appointment as soon as possible for a visit.   Specialty: Nephrology Contact information: [place] [address] [phone]              Contact information for after-discharge care     [facility] Preferred SNF .   Service: Skilled Nursing Contact information: [address] [place] [phone]                    No Known Allergies  Consultations: Nephrology   Procedures/Studies: US Venous Img Lower Bilateral (DVT)  Result Date: 10/10/2022 CLINICAL DATA:  Bilateral lower extremity edema.  Evaluate for DVT.  EXAM: BILATERAL LOWER EXTREMITY VENOUS DOPPLER ULTRASOUND TECHNIQUE: Gray-scale sonography with graded compression, as well as color Doppler and duplex ultrasound were performed to evaluate the lower extremity deep venous systems from the level of the common femoral vein and including the common femoral, femoral, profunda femoral, popliteal and calf veins including the posterior tibial, peroneal and gastrocnemius veins when visible. The superficial great saphenous vein was also interrogated. Spectral Doppler was utilized to evaluate flow at rest and with distal augmentation maneuvers in the common femoral, femoral and popliteal veins. COMPARISON:  Bilateral lower extremity venous Doppler ultrasound-06/14/2020 (negative). FINDINGS: RIGHT LOWER EXTREMITY Common Femoral Vein: No evidence of thrombus. Normal compressibility, respiratory phasicity and response to augmentation. Saphenofemoral Junction: No evidence of thrombus. Normal compressibility and flow on color Doppler imaging. Profunda Femoral Vein: No evidence of thrombus. Normal compressibility and flow on color Doppler imaging. Femoral Vein: No evidence of thrombus. Normal compressibility, respiratory phasicity and response to augmentation. Popliteal Vein: No evidence of thrombus. Normal compressibility, respiratory phasicity and response to augmentation. Calf Veins: No evidence of thrombus. Normal compressibility and flow on color  Doppler imaging. Superficial Great Saphenous Vein: No evidence of thrombus. Normal compressibility. Other Findings:  None. LEFT LOWER EXTREMITY Common Femoral Vein: No evidence of thrombus. Normal compressibility, respiratory phasicity and response to augmentation. Saphenofemoral Junction: No evidence of thrombus. Normal compressibility and flow on color Doppler imaging. Profunda Femoral Vein: No evidence of thrombus. Normal compressibility and flow on color Doppler imaging. Femoral Vein: No evidence of thrombus. Normal  compressibility, respiratory phasicity and response to augmentation. Popliteal Vein: No evidence of thrombus. Normal compressibility, respiratory phasicity and response to augmentation. Calf Veins: No evidence of thrombus. Normal compressibility and flow on color Doppler imaging. Superficial Great Saphenous Vein: No evidence of thrombus. Normal compressibility. Other Findings:  None. IMPRESSION: No evidence of DVT within either lower extremity. Electronically Signed   By: Sandi Mariscal M.D.   On: 10/10/2022 16:47   US Venous Img Upper Uni Right(DVT)  Result Date: 10/10/2022 CLINICAL DATA:  Right upper extremity edema.  Evaluate for DVT. EXAM: RIGHT UPPER EXTREMITY VENOUS DOPPLER ULTRASOUND TECHNIQUE: Gray-scale sonography with graded compression, as well as color Doppler and duplex ultrasound were performed to evaluate the upper extremity deep venous system from the level of the subclavian vein and including the jugular, axillary, basilic, radial, ulnar and upper cephalic vein. Spectral Doppler was utilized to evaluate flow at rest and with distal augmentation maneuvers. COMPARISON:  None Available. FINDINGS: Contralateral Subclavian Vein: Respiratory phasicity is normal and symmetric with the symptomatic side. No evidence of thrombus. Normal compressibility. Internal Jugular Vein: No evidence of thrombus. Normal compressibility, respiratory phasicity and response to augmentation. Subclavian Vein: No evidence of thrombus. Normal compressibility, respiratory phasicity and response to augmentation. Axillary Vein: No evidence of thrombus. Normal compressibility, respiratory phasicity and response to augmentation. Cephalic Vein: No evidence of thrombus. Normal compressibility, respiratory phasicity and response to augmentation. Basilic Vein: No evidence of thrombus. Normal compressibility, respiratory phasicity and response to augmentation. Brachial Veins: No evidence of thrombus. Normal compressibility, respiratory  phasicity and response to augmentation. Radial Veins: No evidence of thrombus. Normal compressibility, respiratory phasicity and response to augmentation. Ulnar Veins: No evidence of thrombus. Normal compressibility, respiratory phasicity and response to augmentation. Other Findings:  None visualized. IMPRESSION: No evidence of DVT within the right upper extremity. Electronically Signed   By: Sandi Mariscal M.D.   On: 10/10/2022 16:46   ECHOCARDIOGRAM COMPLETE  Result Date: 10/02/2022    ECHOCARDIOGRAM REPORT   Patient Name:   John [name] Date of Exam: 10/02/2022 Medical Rec #:  [id number]      Height:       70.0 in Accession #:    [id number]     Weight:       222.0 lb Date of Birth:  May 27, 1946     BSA:          2.182 m Patient Age:    76 years       BP:           117/76 mmHg Patient Gender: M              HR:           85 bpm. Exam Location:  [facility] Procedure: 2D Echo, Cardiac Doppler and Color Doppler Indications:     CHF- acute diastolic V40.08  History:         Patient has prior history of Echocardiogram examinations, most                  recent 01/29/2022. CHF, COPD; Risk Factors:Hypertension.  CKD---stage 4, pneumonia.  Sonographer:     Sherrie Sport Referring Phys:  [id number] Ivor Costa Diagnosing Phys: Yolonda Kida MD  Sonographer Comments: Suboptimal apical window. IMPRESSIONS  1. Left ventricular ejection fraction, by estimation, is 50 to 55%. The left ventricle has low normal function. The left ventricle has no regional wall motion abnormalities. There is mild left ventricular hypertrophy. Left ventricular diastolic parameters are consistent with Grade I diastolic dysfunction (impaired relaxation). There is the interventricular septum is flattened in diastole ('D' shaped left ventricle), consistent with right ventricular volume overload.  2. Right ventricular systolic function is moderately reduced. The right ventricular size is moderately enlarged.  3. Left atrial size was mildly dilated.   4. Right atrial size was moderately dilated.  5. The mitral valve is normal in structure. No evidence of mitral valve regurgitation.  6. Tricuspid valve regurgitation is mild to moderate.  7. The aortic valve is normal in structure. Aortic valve regurgitation is not visualized. FINDINGS  Left Ventricle: Left ventricular ejection fraction, by estimation, is 50 to 55%. The left ventricle has low normal function. The left ventricle has no regional wall motion abnormalities. The left ventricular internal cavity size was normal in size. There is mild left ventricular hypertrophy. The interventricular septum is flattened in diastole ('D' shaped left ventricle), consistent with right ventricular volume overload. Left ventricular diastolic parameters are consistent with Grade I diastolic dysfunction (impaired relaxation). Right Ventricle: The right ventricular size is moderately enlarged. No increase in right ventricular wall thickness. Right ventricular systolic function is moderately reduced. Left Atrium: Left atrial size was mildly dilated. Right Atrium: Right atrial size was moderately dilated. Pericardium: There is no evidence of pericardial effusion. Mitral Valve: The mitral valve is normal in structure. No evidence of mitral valve regurgitation. Tricuspid Valve: The tricuspid valve is normal in structure. Tricuspid valve regurgitation is mild to moderate. Aortic Valve: The aortic valve is normal in structure. Aortic valve regurgitation is not visualized. Aortic valve mean gradient measures 2.0 mmHg. Aortic valve peak gradient measures 4.5 mmHg. Aortic valve area, by VTI measures 2.76 cm. Pulmonic Valve: The pulmonic valve was normal in structure. Pulmonic valve regurgitation is not visualized. Aorta: The ascending aorta was not well visualized. IAS/Shunts: No atrial level shunt detected by color flow Doppler.  LEFT VENTRICLE PLAX 2D LVIDd:         3.40 cm   Diastology LVIDs:         2.50 cm   LV e' medial:    4.35  cm/s LV PW:         1.30 cm   LV E/e' medial:  11.1 LV IVS:        1.30 cm   LV e' lateral:   8.59 cm/s LVOT diam:     2.10 cm   LV E/e' lateral: 5.6 LV SV:         55 LV SV Index:   25 LVOT Area:     3.46 cm  RIGHT VENTRICLE RV Basal diam:  5.10 cm RV Mid diam:    4.80 cm RV S prime:     11.40 cm/s TAPSE (M-mode): 2.4 cm LEFT ATRIUM         Index       RIGHT ATRIUM           Index LA diam:    2.90 cm 1.33 cm/m  RA Area:     30.70 cm  RA Volume:   125.00 ml 57.29 ml/m  AORTIC VALVE                    PULMONIC VALVE AV Area (Vmax):    2.67 cm     PR End Diast Vel: 8.53 msec AV Area (Vmean):   2.55 cm AV Area (VTI):     2.76 cm AV Vmax:           106.00 cm/s AV Vmean:          70.600 cm/s AV VTI:            0.201 m AV Peak Grad:      4.5 mmHg AV Mean Grad:      2.0 mmHg LVOT Vmax:         81.80 cm/s LVOT Vmean:        51.900 cm/s LVOT VTI:          0.160 m LVOT/AV VTI ratio: 0.80  AORTA Ao Root diam: 4.42 cm MITRAL VALVE               TRICUSPID VALVE MV Area (PHT): 3.27 cm    TR Peak grad:   24.0 mmHg MV Decel Time: 232 msec    TR Vmax:        245.00 cm/s MV E velocity: 48.20 cm/s MV A velocity: 67.60 cm/s  SHUNTS MV E/A ratio:  0.71        Systemic VTI:  0.16 m                            Systemic Diam: 2.10 cm Yolonda Kida MD Electronically signed by Yolonda Kida MD Signature Date/Time: 10/02/2022/4:23:07 PM    Final    DG Chest Port 1 View  Result Date: 10/02/2022 CLINICAL DATA:  Several week history of generalized swelling and shortness of breath EXAM: PORTABLE CHEST 1 VIEW COMPARISON:  Chest radiograph dated 01/28/2022 FINDINGS: Increased lung volumes with flattening of the hemidiaphragms. No focal consolidations. No pleural effusion or pneumothorax. Similar enlarged cardiomediastinal silhouette. Aortic atherosclerosis. The visualized skeletal structures are unremarkable. IMPRESSION: 1. No focal consolidation. 2. Increased lung volumes with flattening of the  hemidiaphragms, which can be seen in the setting of COPD. 3. Similar cardiomegaly. 4.  Aortic Atherosclerosis (ICD10-I70.0). Electronically Signed   By: Darrin Nipper M.D.   On: 10/02/2022 08:46     Subjective: Patient seen and examined at bedside, resting calmly.  No specific complaints this morning.  Nephrology now signed off with finalized recommendations and discharging to SNF for further rehabilitation.  Denies headache, no dizziness, no chest pain, no palpitations, no shortness of breath, no abdominal pain, no fever/chills, no focal weakness, no fatigue, no paresthesias.  No acute overnight events per nursing staff.  Discharge Exam: Vitals:   10/14/22 2340 10/15/22 0740  BP: 112/74 111/76  Pulse: 69 66  Resp: 18 17  Temp: 97.6 F (36.4 C) 98 F (36.7 C)  SpO2: 94% 96%   Vitals:   10/14/22 2119 10/14/22 2340 10/15/22 0024 10/15/22 0740  BP: 110/79 112/74  111/76  Pulse: 61 69  66  Resp: '19 18  17  '$ Temp: 98 F (36.7 C) 97.6 F (36.4 C)  98 F (36.7 C)  TempSrc:    Oral  SpO2: 96% 94%  96%  Weight:   97.9 kg   Height:        Physical Exam: GEN: NAD, alert  and oriented x 3, chronically ill appearance, appears older than stated age HEENT: NCAT, PERRL, EOMI, sclera clear, MMM PULM: CTAB w/o wheezes/crackles, normal respiratory effort, on room air CV: RRR w/o M/G/R GI: abd soft, NTND, NABS, no R/G/M MSK: 2-3+ pitting bilateral lower extremity peripheral edema, muscle strength globally intact 5/5 bilateral upper/lower extremities NEURO: CN II-XII intact, no focal deficits, sensation to light touch intact PSYCH: normal mood/affect Integumentary: dry/intact, no rashes or wounds    The results of significant diagnostics from this hospitalization (including imaging, microbiology, ancillary and laboratory) are listed below for reference.     Microbiology: No results found for this or any previous visit (from the past 240 hour(s)).   Labs: BNP (last 3 results) Recent Labs     01/28/22 1603 10/02/22 0858  BNP 968.0* 4,496.7*   Basic Metabolic Panel: Recent Labs  Lab 10/09/22 0451 10/10/22 0551 10/11/22 0542 10/12/22 0441 10/13/22 0432 10/15/22 0523  NA 141 140 139 139 139 141  K 3.3* 3.7 3.5 3.5 3.9 3.9  CL 90* 90* 90* 90* 90* 93*  CO2 42* 41* 40* 42* 40* 41*  GLUCOSE 107* 107* 101* 97 106* 94  BUN 24* 35* 32* 33* 32* 27*  CREATININE 1.74* 1.81* 1.65* 1.91* 1.68* 1.67*  CALCIUM 8.8* 8.7* 8.8* 9.0 9.1 9.0  MG 2.1 2.1 2.3 2.4 2.7*  --   PHOS  --   --  2.8 2.8 3.6  --    Liver Function Tests: No results for input(s): "AST", "ALT", "ALKPHOS", "BILITOT", "PROT", "ALBUMIN" in the last 168 hours. No results for input(s): "LIPASE", "AMYLASE" in the last 168 hours. No results for input(s): "AMMONIA" in the last 168 hours. CBC: Recent Labs  Lab 10/09/22 0451 10/11/22 0542 10/12/22 0441 10/13/22 0432  WBC 6.2 5.6 5.5 6.2  HGB 15.7 15.6 15.5 16.2  HCT 51.9 51.1 50.8 54.3*  MCV 105.7* 103.2* 103.5* 104.8*  PLT 91* 108* 112* 138*   Cardiac Enzymes: No results for input(s): "CKTOTAL", "CKMB", "CKMBINDEX", "TROPONINI" in the last 168 hours. BNP: Invalid input(s): "POCBNP" CBG: No results for input(s): "GLUCAP" in the last 168 hours. D-Dimer No results for input(s): "DDIMER" in the last 72 hours. Hgb A1c No results for input(s): "HGBA1C" in the last 72 hours. Lipid Profile No results for input(s): "CHOL", "HDL", "LDLCALC", "TRIG", "CHOLHDL", "LDLDIRECT" in the last 72 hours. Thyroid function studies No results for input(s): "TSH", "T4TOTAL", "T3FREE", "THYROIDAB" in the last 72 hours.  Invalid input(s): "FREET3" Anemia work up No results for input(s): "VITAMINB12", "FOLATE", "FERRITIN", "TIBC", "IRON", "RETICCTPCT" in the last 72 hours. Urinalysis    Component Value Date/Time   COLORURINE YELLOW (A) 01/30/2022 2331   APPEARANCEUR CLEAR (A) 01/30/2022 2331   LABSPEC 1.008 01/30/2022 2331   PHURINE 5.0 01/30/2022 2331   GLUCOSEU NEGATIVE  01/30/2022 2331   HGBUR NEGATIVE 01/30/2022 2331   BILIRUBINUR NEGATIVE 01/30/2022 2331   KETONESUR NEGATIVE 01/30/2022 2331   PROTEINUR NEGATIVE 01/30/2022 2331   NITRITE NEGATIVE 01/30/2022 2331   LEUKOCYTESUR NEGATIVE 01/30/2022 2331   Sepsis Labs Recent Labs  Lab 10/09/22 0451 10/11/22 0542 10/12/22 0441 10/13/22 0432  WBC 6.2 5.6 5.5 6.2   Microbiology No results found for this or any previous visit (from the past 240 hour(s)).   Time coordinating discharge: Over 30 minutes  SIGNED:   Arbie Blankley J [place] DO  [facility] 10/15/2022, 9:02 AM

## 2022-10-16 ENCOUNTER — Ambulatory Visit: Payer: Medicare HMO | Admitting: Family

## 2022-10-17 ENCOUNTER — Telehealth: Payer: Self-pay | Admitting: *Deleted

## 2022-10-17 NOTE — Chronic Care Management (AMB) (Signed)
  Care Coordination Note  10/17/2022 Name: Khai Arrona MRN: [id number] DOB: 03/12/1946  Fender Herder is a 76 y.o. year old male who is a primary care patient of Hande, Cherlyn Labella, MD and is actively engaged with the care management team. I reached out to [name] by phone today to assist with re-scheduling a follow up visit with the RN Case Manager  Follow up plan: Per [name] Friend patient is in SNF for physical therapy after hospital admission.   [place]  Direct Dial: [phone]

## 2022-10-18 DIAGNOSIS — G603 Idiopathic progressive neuropathy: Secondary | ICD-10-CM | POA: Diagnosis not present

## 2022-10-18 DIAGNOSIS — K219 Gastro-esophageal reflux disease without esophagitis: Secondary | ICD-10-CM | POA: Diagnosis not present

## 2022-10-18 DIAGNOSIS — I1310 Hypertensive heart and chronic kidney disease without heart failure, with stage 1 through stage 4 chronic kidney disease, or unspecified chronic kidney disease: Secondary | ICD-10-CM | POA: Diagnosis not present

## 2022-10-18 DIAGNOSIS — I5043 Acute on chronic combined systolic (congestive) and diastolic (congestive) heart failure: Secondary | ICD-10-CM | POA: Diagnosis not present

## 2022-10-18 DIAGNOSIS — L03116 Cellulitis of left lower limb: Secondary | ICD-10-CM | POA: Diagnosis not present

## 2022-10-18 DIAGNOSIS — L03115 Cellulitis of right lower limb: Secondary | ICD-10-CM | POA: Diagnosis not present

## 2022-10-18 DIAGNOSIS — R54 Age-related physical debility: Secondary | ICD-10-CM | POA: Diagnosis not present

## 2022-10-21 DIAGNOSIS — L03115 Cellulitis of right lower limb: Secondary | ICD-10-CM | POA: Diagnosis not present

## 2022-10-21 DIAGNOSIS — I5043 Acute on chronic combined systolic (congestive) and diastolic (congestive) heart failure: Secondary | ICD-10-CM | POA: Diagnosis not present

## 2022-10-21 DIAGNOSIS — K219 Gastro-esophageal reflux disease without esophagitis: Secondary | ICD-10-CM | POA: Diagnosis not present

## 2022-10-21 DIAGNOSIS — R54 Age-related physical debility: Secondary | ICD-10-CM | POA: Diagnosis not present

## 2022-10-21 DIAGNOSIS — I1310 Hypertensive heart and chronic kidney disease without heart failure, with stage 1 through stage 4 chronic kidney disease, or unspecified chronic kidney disease: Secondary | ICD-10-CM | POA: Diagnosis not present

## 2022-10-21 DIAGNOSIS — G603 Idiopathic progressive neuropathy: Secondary | ICD-10-CM | POA: Diagnosis not present

## 2022-10-21 DIAGNOSIS — L03116 Cellulitis of left lower limb: Secondary | ICD-10-CM | POA: Diagnosis not present

## 2022-10-22 DIAGNOSIS — G603 Idiopathic progressive neuropathy: Secondary | ICD-10-CM | POA: Diagnosis not present

## 2022-10-22 DIAGNOSIS — K219 Gastro-esophageal reflux disease without esophagitis: Secondary | ICD-10-CM | POA: Diagnosis not present

## 2022-10-22 DIAGNOSIS — L03116 Cellulitis of left lower limb: Secondary | ICD-10-CM | POA: Diagnosis not present

## 2022-10-22 DIAGNOSIS — L03115 Cellulitis of right lower limb: Secondary | ICD-10-CM | POA: Diagnosis not present

## 2022-10-22 DIAGNOSIS — R54 Age-related physical debility: Secondary | ICD-10-CM | POA: Diagnosis not present

## 2022-10-22 DIAGNOSIS — I1310 Hypertensive heart and chronic kidney disease without heart failure, with stage 1 through stage 4 chronic kidney disease, or unspecified chronic kidney disease: Secondary | ICD-10-CM | POA: Diagnosis not present

## 2022-10-22 DIAGNOSIS — I5043 Acute on chronic combined systolic (congestive) and diastolic (congestive) heart failure: Secondary | ICD-10-CM | POA: Diagnosis not present

## 2022-10-22 DIAGNOSIS — N184 Chronic kidney disease, stage 4 (severe): Secondary | ICD-10-CM | POA: Diagnosis not present

## 2022-10-24 ENCOUNTER — Telehealth: Payer: Self-pay | Admitting: Family

## 2022-10-24 ENCOUNTER — Ambulatory Visit: Payer: Medicare HMO | Admitting: Family

## 2022-10-24 NOTE — Telephone Encounter (Signed)
Patient called stating he was discharged home from rehab with all of his medications but does not have a nebulizer machine. I advised patient per tina that he needs to call primary care for that.   John [name], NT

## 2022-10-25 DIAGNOSIS — J449 Chronic obstructive pulmonary disease, unspecified: Secondary | ICD-10-CM | POA: Diagnosis not present

## 2022-10-25 DIAGNOSIS — I7121 Aneurysm of the ascending aorta, without rupture: Secondary | ICD-10-CM | POA: Diagnosis not present

## 2022-10-25 DIAGNOSIS — N189 Chronic kidney disease, unspecified: Secondary | ICD-10-CM | POA: Diagnosis not present

## 2022-10-25 DIAGNOSIS — J9611 Chronic respiratory failure with hypoxia: Secondary | ICD-10-CM | POA: Diagnosis not present

## 2022-10-25 DIAGNOSIS — Z09 Encounter for follow-up examination after completed treatment for conditions other than malignant neoplasm: Secondary | ICD-10-CM | POA: Diagnosis not present

## 2022-10-25 DIAGNOSIS — N1832 Chronic kidney disease, stage 3b: Secondary | ICD-10-CM | POA: Diagnosis not present

## 2022-10-25 DIAGNOSIS — I5032 Chronic diastolic (congestive) heart failure: Secondary | ICD-10-CM | POA: Diagnosis not present

## 2022-10-25 DIAGNOSIS — R7309 Other abnormal glucose: Secondary | ICD-10-CM | POA: Diagnosis not present

## 2022-10-25 DIAGNOSIS — I700 Atherosclerosis of aorta: Secondary | ICD-10-CM | POA: Diagnosis not present

## 2022-10-30 DIAGNOSIS — I2609 Other pulmonary embolism with acute cor pulmonale: Secondary | ICD-10-CM | POA: Diagnosis not present

## 2022-10-30 DIAGNOSIS — N1831 Chronic kidney disease, stage 3a: Secondary | ICD-10-CM | POA: Diagnosis not present

## 2022-10-30 DIAGNOSIS — E785 Hyperlipidemia, unspecified: Secondary | ICD-10-CM | POA: Diagnosis not present

## 2022-10-30 DIAGNOSIS — I5032 Chronic diastolic (congestive) heart failure: Secondary | ICD-10-CM | POA: Diagnosis not present

## 2022-10-30 DIAGNOSIS — J9611 Chronic respiratory failure with hypoxia: Secondary | ICD-10-CM | POA: Diagnosis not present

## 2022-10-30 DIAGNOSIS — R300 Dysuria: Secondary | ICD-10-CM | POA: Diagnosis not present

## 2022-10-30 DIAGNOSIS — D696 Thrombocytopenia, unspecified: Secondary | ICD-10-CM | POA: Diagnosis not present

## 2022-10-30 DIAGNOSIS — N184 Chronic kidney disease, stage 4 (severe): Secondary | ICD-10-CM | POA: Diagnosis not present

## 2022-10-30 DIAGNOSIS — I130 Hypertensive heart and chronic kidney disease with heart failure and stage 1 through stage 4 chronic kidney disease, or unspecified chronic kidney disease: Secondary | ICD-10-CM | POA: Diagnosis not present

## 2022-10-30 DIAGNOSIS — I5033 Acute on chronic diastolic (congestive) heart failure: Secondary | ICD-10-CM | POA: Diagnosis not present

## 2022-10-30 DIAGNOSIS — J449 Chronic obstructive pulmonary disease, unspecified: Secondary | ICD-10-CM | POA: Diagnosis not present

## 2022-10-30 DIAGNOSIS — Z86711 Personal history of pulmonary embolism: Secondary | ICD-10-CM | POA: Diagnosis not present

## 2022-10-30 DIAGNOSIS — K219 Gastro-esophageal reflux disease without esophagitis: Secondary | ICD-10-CM | POA: Diagnosis not present

## 2022-10-31 DIAGNOSIS — N2581 Secondary hyperparathyroidism of renal origin: Secondary | ICD-10-CM | POA: Diagnosis not present

## 2022-10-31 DIAGNOSIS — I5022 Chronic systolic (congestive) heart failure: Secondary | ICD-10-CM | POA: Diagnosis not present

## 2022-10-31 DIAGNOSIS — R300 Dysuria: Secondary | ICD-10-CM | POA: Diagnosis not present

## 2022-10-31 DIAGNOSIS — I10 Essential (primary) hypertension: Secondary | ICD-10-CM | POA: Diagnosis not present

## 2022-10-31 DIAGNOSIS — N1832 Chronic kidney disease, stage 3b: Secondary | ICD-10-CM | POA: Diagnosis not present

## 2022-11-19 ENCOUNTER — Other Ambulatory Visit: Payer: Self-pay | Admitting: Family

## 2022-11-19 ENCOUNTER — Ambulatory Visit
Admission: RE | Admit: 2022-11-19 | Discharge: 2022-11-19 | Disposition: A | Discharge status: Home / Self Care | Payer: Medicare HMO | Source: Ambulatory Visit

## 2022-11-19 ENCOUNTER — Encounter: Payer: Self-pay | Admitting: Family

## 2022-11-19 ENCOUNTER — Telehealth: Payer: Self-pay | Admitting: Family

## 2022-11-19 ENCOUNTER — Ambulatory Visit (HOSPITAL_BASED_OUTPATIENT_CLINIC_OR_DEPARTMENT_OTHER): Payer: Medicare HMO | Admitting: Family

## 2022-11-19 VITALS — BP 127/80 | HR 96 | Resp 16 | Wt 209.0 lb

## 2022-11-19 DIAGNOSIS — J811 Chronic pulmonary edema: Secondary | ICD-10-CM | POA: Diagnosis not present

## 2022-11-19 DIAGNOSIS — I5032 Chronic diastolic (congestive) heart failure: Secondary | ICD-10-CM | POA: Diagnosis not present

## 2022-11-19 DIAGNOSIS — I10 Essential (primary) hypertension: Secondary | ICD-10-CM | POA: Diagnosis not present

## 2022-11-19 DIAGNOSIS — I493 Ventricular premature depolarization: Secondary | ICD-10-CM | POA: Diagnosis present

## 2022-11-19 DIAGNOSIS — I7090 Unspecified atherosclerosis: Secondary | ICD-10-CM | POA: Diagnosis present

## 2022-11-19 DIAGNOSIS — N189 Chronic kidney disease, unspecified: Secondary | ICD-10-CM | POA: Diagnosis not present

## 2022-11-19 DIAGNOSIS — I50813 Acute on chronic right heart failure: Secondary | ICD-10-CM | POA: Diagnosis not present

## 2022-11-19 DIAGNOSIS — I5033 Acute on chronic diastolic (congestive) heart failure: Secondary | ICD-10-CM

## 2022-11-19 DIAGNOSIS — R609 Edema, unspecified: Secondary | ICD-10-CM | POA: Insufficient documentation

## 2022-11-19 DIAGNOSIS — Z79899 Other long term (current) drug therapy: Secondary | ICD-10-CM | POA: Insufficient documentation

## 2022-11-19 DIAGNOSIS — I5082 Biventricular heart failure: Secondary | ICD-10-CM | POA: Diagnosis present

## 2022-11-19 DIAGNOSIS — J449 Chronic obstructive pulmonary disease, unspecified: Secondary | ICD-10-CM | POA: Insufficient documentation

## 2022-11-19 DIAGNOSIS — R600 Localized edema: Secondary | ICD-10-CM | POA: Diagnosis not present

## 2022-11-19 DIAGNOSIS — N184 Chronic kidney disease, stage 4 (severe): Secondary | ICD-10-CM | POA: Diagnosis not present

## 2022-11-19 DIAGNOSIS — J9811 Atelectasis: Secondary | ICD-10-CM | POA: Diagnosis not present

## 2022-11-19 DIAGNOSIS — J9621 Acute and chronic respiratory failure with hypoxia: Secondary | ICD-10-CM | POA: Diagnosis not present

## 2022-11-19 DIAGNOSIS — I5021 Acute systolic (congestive) heart failure: Secondary | ICD-10-CM | POA: Diagnosis not present

## 2022-11-19 DIAGNOSIS — I5023 Acute on chronic systolic (congestive) heart failure: Secondary | ICD-10-CM | POA: Diagnosis not present

## 2022-11-19 DIAGNOSIS — N179 Acute kidney failure, unspecified: Secondary | ICD-10-CM | POA: Diagnosis not present

## 2022-11-19 DIAGNOSIS — I130 Hypertensive heart and chronic kidney disease with heart failure and stage 1 through stage 4 chronic kidney disease, or unspecified chronic kidney disease: Secondary | ICD-10-CM | POA: Insufficient documentation

## 2022-11-19 DIAGNOSIS — Z87891 Personal history of nicotine dependence: Secondary | ICD-10-CM | POA: Insufficient documentation

## 2022-11-19 DIAGNOSIS — Z5329 Procedure and treatment not carried out because of patient's decision for other reasons: Secondary | ICD-10-CM | POA: Diagnosis present

## 2022-11-19 DIAGNOSIS — J9622 Acute and chronic respiratory failure with hypercapnia: Secondary | ICD-10-CM | POA: Diagnosis not present

## 2022-11-19 DIAGNOSIS — J90 Pleural effusion, not elsewhere classified: Secondary | ICD-10-CM | POA: Diagnosis not present

## 2022-11-19 DIAGNOSIS — R7301 Impaired fasting glucose: Secondary | ICD-10-CM | POA: Diagnosis not present

## 2022-11-19 DIAGNOSIS — I2489 Other forms of acute ischemic heart disease: Secondary | ICD-10-CM | POA: Diagnosis not present

## 2022-11-19 DIAGNOSIS — Z6832 Body mass index (BMI) 32.0-32.9, adult: Secondary | ICD-10-CM | POA: Diagnosis not present

## 2022-11-19 DIAGNOSIS — Z833 Family history of diabetes mellitus: Secondary | ICD-10-CM | POA: Diagnosis not present

## 2022-11-19 DIAGNOSIS — J9611 Chronic respiratory failure with hypoxia: Secondary | ICD-10-CM | POA: Diagnosis not present

## 2022-11-19 DIAGNOSIS — E8729 Other acidosis: Secondary | ICD-10-CM | POA: Diagnosis not present

## 2022-11-19 DIAGNOSIS — D696 Thrombocytopenia, unspecified: Secondary | ICD-10-CM | POA: Diagnosis not present

## 2022-11-19 DIAGNOSIS — I509 Heart failure, unspecified: Secondary | ICD-10-CM | POA: Diagnosis not present

## 2022-11-19 DIAGNOSIS — E876 Hypokalemia: Secondary | ICD-10-CM | POA: Diagnosis not present

## 2022-11-19 DIAGNOSIS — K8020 Calculus of gallbladder without cholecystitis without obstruction: Secondary | ICD-10-CM | POA: Diagnosis not present

## 2022-11-19 DIAGNOSIS — Z9842 Cataract extraction status, left eye: Secondary | ICD-10-CM | POA: Diagnosis not present

## 2022-11-19 DIAGNOSIS — R0602 Shortness of breath: Secondary | ICD-10-CM | POA: Diagnosis not present

## 2022-11-19 DIAGNOSIS — E785 Hyperlipidemia, unspecified: Secondary | ICD-10-CM | POA: Diagnosis present

## 2022-11-19 DIAGNOSIS — E669 Obesity, unspecified: Secondary | ICD-10-CM | POA: Diagnosis not present

## 2022-11-19 DIAGNOSIS — R601 Generalized edema: Secondary | ICD-10-CM | POA: Diagnosis not present

## 2022-11-19 DIAGNOSIS — N1832 Chronic kidney disease, stage 3b: Secondary | ICD-10-CM | POA: Diagnosis not present

## 2022-11-19 DIAGNOSIS — N5089 Other specified disorders of the male genital organs: Secondary | ICD-10-CM | POA: Diagnosis present

## 2022-11-19 LAB — BASIC METABOLIC PANEL
Anion gap: 9 (ref 5–15)
BUN: 70 mg/dL — ABNORMAL HIGH (ref 8–23)
CO2: 35 mmol/L — ABNORMAL HIGH (ref 22–32)
Calcium: 9.4 mg/dL (ref 8.9–10.3)
Chloride: 96 mmol/L — ABNORMAL LOW (ref 98–111)
Creatinine, Ser: 3.4 mg/dL — ABNORMAL HIGH (ref 0.61–1.24)
GFR, Estimated: 18 mL/min — ABNORMAL LOW (ref >60)
Glucose, Bld: 132 mg/dL — ABNORMAL HIGH (ref 70–99)
Potassium: 3.5 mmol/L (ref 3.5–5.1)
Sodium: 140 mmol/L (ref 135–145)

## 2022-11-19 LAB — BRAIN NATRIURETIC PEPTIDE: B Natriuretic Peptide: 2861.1 pg/mL — ABNORMAL HIGH (ref 0.0–100.0)

## 2022-11-19 MED ORDER — FUROSEMIDE 10 MG/ML IJ SOLN: 80.0000 mg | Freq: Once | INTRAMUSCULAR | Status: AC

## 2022-11-19 MED ORDER — FUROSEMIDE 10 MG/ML IJ SOLN
INTRAMUSCULAR | Status: AC
  Administered 2022-11-19: 80 mg via INTRAVENOUS
  Filled 2022-11-19: qty 8

## 2022-11-19 MED ORDER — POTASSIUM CHLORIDE CRYS ER 20 MEQ PO TBCR: 40.0000 meq | EXTENDED_RELEASE_TABLET | Freq: Once | ORAL | Status: AC

## 2022-11-19 MED ORDER — POTASSIUM CHLORIDE CRYS ER 20 MEQ PO TBCR
EXTENDED_RELEASE_TABLET | ORAL | Status: AC
  Administered 2022-11-19: 40 meq via ORAL
  Filled 2022-11-19: qty 2

## 2022-11-19 NOTE — Progress Notes (Signed)
Patient ID: John [name], male    DOB: 12/26/1945, 76 y.o.   MRN: [id number]   John [name] is a 76 y/o male with a history of HTN, CKD, COPD, hyperlipidemia, remote tobacco use and chronic heart failure.   Echo report from 10/02/22 showed an EF of 50-55% along with mild LVH, moderately enlarged right ventricle, mild LAE, moderate RAE and mild/ moderate TR. Echo report from 01/29/22 reviewed and showed an EF of 55-60% along with moderately elevated PA pressure and mild/moderate TR.   Admitted 10/02/22 due to progressive lower extremity edema and weight gain. Initially given IV lasix. Found to be hypoxic needing O2 at 2L. Nephrology consult obtained. Transitioned to lasix drip and then able to transition to oral diuretics. Metolazone twice weekly started. Losartan stopped. Antibiotics given for right hand cellulitis. PT/OT evaluations done. Discharged after 13 days.   He presents today for a follow-up visit with a chief complaint of moderate fatigue with very little exertion. Describes this as chronic but says that it's worse today. He has associated pedal edema to his thighs (worsening), abdominal distention, scrotal swelling, slight weight gain and difficulty sleeping along with this. Denies any dizziness, palpitations, chest pain, wheezing, shortness of breath or cough.   Says that he's drinking "very little fluids" because of the fluid retention. He says that he's told that he can drink up to 32 ounces but hasn't been anywhere close to that amount.   Past Medical History:  Diagnosis Date   CHF (congestive heart failure) (HCC)    EF 45% in 2021   Chronic kidney disease    COPD (chronic obstructive pulmonary disease) ([place]) 02/16/2020   Hyperlipidemia    Hypertension    Pneumonia 2021    Past Surgical History:  Procedure Laterality Date   CATARACT EXTRACTION W/PHACO Left 10/31/2021   Procedure: CATARACT EXTRACTION PHACO AND INTRAOCULAR LENS PLACEMENT ([place]) LEFT VISION BLUE 3.45 00:48.0;   Surgeon: Leandrew Koyanagi, MD;  Location: [place];  Service: Ophthalmology;  Laterality: Left;   CATARACT EXTRACTION W/PHACO Right 11/14/2021   Procedure: CATARACT EXTRACTION PHACO AND INTRAOCULAR LENS PLACEMENT ([facility]) RIGHT 8.59 01:10.5;  Surgeon: Leandrew Koyanagi, MD;  Location: [place];  Service: Ophthalmology;  Laterality: Right;   INGUINAL HERNIA REPAIR Right 02/04/2017   Procedure: HERNIA REPAIR INGUINAL ADULT;  Surgeon: Leonie Green, MD;  Location: [facility];  Service: General;  Laterality: Right;   NO PAST SURGERIES     QUADRICEPS TENDON REPAIR Left 02/15/2020   Procedure: REPAIR QUADRICEP TENDON;  Surgeon: Corky Mull, MD;  Location: [facility];  Service: Orthopedics;  Laterality: Left;   Family History  Problem Relation Age of Onset   Diabetes Mother    Diabetes Brother    Diabetes Maternal Grandmother    Diabetes Paternal Grandmother    Social History   Tobacco Use   Smoking status: Former    Packs/day: 0.25    Years: 53.00    Total pack years: 13.25    Types: Cigarettes    Quit date: 02/15/2015    Years since quitting: 7.7   Smokeless tobacco: Never  Substance Use Topics   Alcohol use: Yes    Comment: occassional   No Known Allergies  Prior to Admission medications   Medication Sig Start Date End Date Taking? Authorizing Provider  aspirin EC 81 MG tablet Take 1 tablet (81 mg total) by mouth daily. Swallow whole. 03/28/22  Yes Darylene Price A, FNP  calcitRIOL (ROCALTROL) 0.25 MCG capsule Take 0.25  mcg by mouth daily.   Yes [provider]  gabapentin (NEURONTIN) 100 MG capsule Take 1 capsule (100 mg total) by mouth 2 (two) times daily. 10/15/22  Yes [place] Eric J, DO  ipratropium-albuterol (DUONEB) 0.5-2.5 (3) MG/3ML SOLN Inhale 3 mLs into the lungs every 6 (six) hours as needed (wheezing).   Yes [provider]  metolazone (ZAROXOLYN) 2.5 MG tablet Take 1 tablet (2.5 mg total) by mouth 2 (two) times a week. 10/15/22   Yes [place] Eric J, DO  midodrine (PROAMATINE) 5 MG tablet Take 1 tablet (5 mg total) by mouth 3 (three) times daily with meals. 10/15/22  Yes [place] Eric J, DO  omeprazole (PRILOSEC) 20 MG capsule Take 20 mg by mouth daily. 11/04/22 11/04/23 Yes [provider]  torsemide (DEMADEX) 20 MG tablet Take 1 tablet (20 mg total) by mouth daily. 10/15/22  Yes [place] Donnamarie Poag, DO   Review of Systems  Constitutional:  Positive for fatigue (easily today). Negative for appetite change.  HENT:  Negative for congestion, postnasal drip and sore throat.   Eyes: Negative.   Respiratory:  Negative for cough, shortness of breath and wheezing.   Cardiovascular:  Positive for leg swelling. Negative for chest pain and palpitations.  Gastrointestinal:  Positive for abdominal distention. Negative for abdominal pain.  Endocrine: Negative.   Genitourinary:  Positive for scrotal swelling.  Musculoskeletal:  Negative for arthralgias, back pain and neck pain.  Skin: Negative.   Allergic/Immunologic: Negative.   Neurological:  Negative for dizziness and light-headedness.  Hematological:  Negative for adenopathy. Does not bruise/bleed easily.  Psychiatric/Behavioral:  Positive for sleep disturbance (chronic; sleeping on 2 pillows). Negative for dysphoric mood. The patient is not nervous/anxious.    Vitals:   11/19/22 1242  BP: 127/80  Pulse: 96  Resp: 16  SpO2: 93%  Weight: 209 lb (94.8 kg)   Wt Readings from Last 3 Encounters:  11/19/22 209 lb (94.8 kg)  10/15/22 215 lb 14.4 oz (97.9 kg)  07/30/22 203 lb 4 oz (92.2 kg)   Lab Results  Component Value Date   CREATININE 1.67 (H) 10/15/2022   CREATININE 1.68 (H) 10/13/2022   CREATININE 1.91 (H) 10/12/2022   Physical Exam Vitals and nursing note reviewed.  Constitutional:      Appearance: Normal appearance.  HENT:     Head: Normocephalic and atraumatic.  Cardiovascular:     Rate and Rhythm: Normal rate and regular rhythm.  Pulmonary:      Effort: Pulmonary effort is normal. No respiratory distress.     Breath sounds: No wheezing or rales.     Comments: diminished Abdominal:     General: There is distension.     Palpations: Abdomen is soft.  Musculoskeletal:        General: No tenderness.     Cervical back: Normal range of motion and neck supple.     Right lower leg: Edema (3+ pitting into thighs) present.     Left lower leg: Edema (3+ pitting into thighs) present.  Skin:    General: Skin is warm and dry.  Neurological:     General: No focal deficit present.     Mental Status: He is alert and oriented to person, place, and time.  Psychiatric:        Mood and Affect: Mood normal.        Behavior: Behavior normal.        Thought Content: Thought content normal.   Assessment & Plan:  1: Acute on  Chronic heart failure with preserved ejection fraction with structural changes (LAE)- - NYHA class III - moderately fluid overloaded today with weight gain, edema into thighs and abdominal/scrotal distention - weighing daily and understands to call for an overnight weight gain of > 2 pounds or a weekly weight gain of > 5 pounds - weight up 6 pounds from last visit here 4 months ago - will send for '80mg'$  IV lasix/ 86mq potassium - will check BNP/ BMP today - not adding salt to his food  - has fluid restriction of 32 ounces from nephrology but patient says that he's not even drinking that much because he was afraid of retaining fluids; says that he drinks some fluids when he takes his medicine and "that's about it' - BNP 10/02/22 was 1103.2  2: HTN with CKD- - BP looks good (127/80) - saw PCP (Vidal Schwalbe 10/30/22 - saw nephrology (Holley Raring 10/01/22  - BMP 10/25/22 reviewed and showed sodium 144, potassium 3.7, creatinine 2.2 and GFR 30  3: COPD- - has oxygen at home that he wears at 2L PRN - quit smoking ~ 10 years ago   Medication bottles reviewed.   Return tomorrow.

## 2022-11-19 NOTE — Patient Instructions (Signed)
Continue weighing daily and call for an overnight weight gain of 3 pounds or more or a weekly weight gain of more than 5 pounds.   If you have voicemail, please make sure your mailbox is cleaned out so that we may leave a message and please make sure to listen to any voicemails.     

## 2022-11-19 NOTE — Telephone Encounter (Signed)
Received BMP/BNP results obtained earlier today from when he received IV lasix. BMP shows worsening renal function and BNP also elevated. Called patient and informed of these results and that I felt the best thing for him would be to come to the ER to be admitted. Patient is hesitant to return tonight as he says that he needs "time to process things". He would like to keep his f/u appointment with me tomorrow so that we can discuss this further. Explained that we can do that but that I would still be recommending that he go to the ER. He says that he understands this but just wants time to think about this. Plan to see him tomorrow morning.

## 2022-11-20 ENCOUNTER — Encounter: Payer: Self-pay | Admitting: Family

## 2022-11-20 ENCOUNTER — Ambulatory Visit (HOSPITAL_BASED_OUTPATIENT_CLINIC_OR_DEPARTMENT_OTHER): Payer: Medicare HMO | Admitting: Family

## 2022-11-20 ENCOUNTER — Emergency Department: Payer: Medicare HMO

## 2022-11-20 ENCOUNTER — Encounter: Payer: Self-pay | Admitting: Internal Medicine

## 2022-11-20 ENCOUNTER — Observation Stay: Payer: Medicare HMO

## 2022-11-20 ENCOUNTER — Other Ambulatory Visit: Payer: Self-pay

## 2022-11-20 ENCOUNTER — Inpatient Hospital Stay
Admission: EM | Admit: 2022-11-20 | Discharge: 2022-11-27 | DRG: 291 | Disposition: A | Discharge status: Home Health | Payer: Medicare HMO | Attending: Internal Medicine | Admitting: Internal Medicine

## 2022-11-20 VITALS — BP 121/91 | HR 80 | Resp 16 | Wt 229.0 lb

## 2022-11-20 DIAGNOSIS — E669 Obesity, unspecified: Secondary | ICD-10-CM | POA: Diagnosis present

## 2022-11-20 DIAGNOSIS — J9622 Acute and chronic respiratory failure with hypercapnia: Secondary | ICD-10-CM | POA: Diagnosis present

## 2022-11-20 DIAGNOSIS — I130 Hypertensive heart and chronic kidney disease with heart failure and stage 1 through stage 4 chronic kidney disease, or unspecified chronic kidney disease: Secondary | ICD-10-CM | POA: Insufficient documentation

## 2022-11-20 DIAGNOSIS — E785 Hyperlipidemia, unspecified: Secondary | ICD-10-CM | POA: Diagnosis present

## 2022-11-20 DIAGNOSIS — I2489 Other forms of acute ischemic heart disease: Secondary | ICD-10-CM | POA: Diagnosis present

## 2022-11-20 DIAGNOSIS — J9621 Acute and chronic respiratory failure with hypoxia: Secondary | ICD-10-CM | POA: Diagnosis present

## 2022-11-20 DIAGNOSIS — Z5329 Procedure and treatment not carried out because of patient's decision for other reasons: Secondary | ICD-10-CM | POA: Diagnosis present

## 2022-11-20 DIAGNOSIS — Z6832 Body mass index (BMI) 32.0-32.9, adult: Secondary | ICD-10-CM

## 2022-11-20 DIAGNOSIS — R5383 Other fatigue: Secondary | ICD-10-CM | POA: Insufficient documentation

## 2022-11-20 DIAGNOSIS — Z833 Family history of diabetes mellitus: Secondary | ICD-10-CM

## 2022-11-20 DIAGNOSIS — I5033 Acute on chronic diastolic (congestive) heart failure: Secondary | ICD-10-CM

## 2022-11-20 DIAGNOSIS — N184 Chronic kidney disease, stage 4 (severe): Secondary | ICD-10-CM | POA: Diagnosis present

## 2022-11-20 DIAGNOSIS — Z87891 Personal history of nicotine dependence: Secondary | ICD-10-CM | POA: Insufficient documentation

## 2022-11-20 DIAGNOSIS — N179 Acute kidney failure, unspecified: Secondary | ICD-10-CM | POA: Insufficient documentation

## 2022-11-20 DIAGNOSIS — Z961 Presence of intraocular lens: Secondary | ICD-10-CM | POA: Diagnosis present

## 2022-11-20 DIAGNOSIS — N5089 Other specified disorders of the male genital organs: Secondary | ICD-10-CM | POA: Diagnosis present

## 2022-11-20 DIAGNOSIS — I10 Essential (primary) hypertension: Secondary | ICD-10-CM | POA: Diagnosis not present

## 2022-11-20 DIAGNOSIS — R7301 Impaired fasting glucose: Secondary | ICD-10-CM | POA: Diagnosis present

## 2022-11-20 DIAGNOSIS — J9611 Chronic respiratory failure with hypoxia: Secondary | ICD-10-CM

## 2022-11-20 DIAGNOSIS — R609 Edema, unspecified: Principal | ICD-10-CM

## 2022-11-20 DIAGNOSIS — N189 Chronic kidney disease, unspecified: Secondary | ICD-10-CM | POA: Insufficient documentation

## 2022-11-20 DIAGNOSIS — I50813 Acute on chronic right heart failure: Secondary | ICD-10-CM

## 2022-11-20 DIAGNOSIS — Z79899 Other long term (current) drug therapy: Secondary | ICD-10-CM

## 2022-11-20 DIAGNOSIS — K8020 Calculus of gallbladder without cholecystitis without obstruction: Secondary | ICD-10-CM | POA: Diagnosis not present

## 2022-11-20 DIAGNOSIS — E876 Hypokalemia: Secondary | ICD-10-CM | POA: Diagnosis present

## 2022-11-20 DIAGNOSIS — I493 Ventricular premature depolarization: Secondary | ICD-10-CM | POA: Diagnosis present

## 2022-11-20 DIAGNOSIS — I509 Heart failure, unspecified: Secondary | ICD-10-CM

## 2022-11-20 DIAGNOSIS — R0602 Shortness of breath: Secondary | ICD-10-CM | POA: Insufficient documentation

## 2022-11-20 DIAGNOSIS — J449 Chronic obstructive pulmonary disease, unspecified: Secondary | ICD-10-CM

## 2022-11-20 DIAGNOSIS — Z9842 Cataract extraction status, left eye: Secondary | ICD-10-CM

## 2022-11-20 DIAGNOSIS — R601 Generalized edema: Secondary | ICD-10-CM

## 2022-11-20 DIAGNOSIS — Z7982 Long term (current) use of aspirin: Secondary | ICD-10-CM

## 2022-11-20 DIAGNOSIS — Z9841 Cataract extraction status, right eye: Secondary | ICD-10-CM

## 2022-11-20 DIAGNOSIS — E8729 Other acidosis: Secondary | ICD-10-CM | POA: Diagnosis present

## 2022-11-20 DIAGNOSIS — D696 Thrombocytopenia, unspecified: Secondary | ICD-10-CM | POA: Diagnosis not present

## 2022-11-20 DIAGNOSIS — I7090 Unspecified atherosclerosis: Secondary | ICD-10-CM | POA: Diagnosis present

## 2022-11-20 DIAGNOSIS — I5082 Biventricular heart failure: Secondary | ICD-10-CM | POA: Diagnosis present

## 2022-11-20 LAB — CBC
HCT: 53.7 % — ABNORMAL HIGH (ref 39.0–52.0)
Hemoglobin: 16.5 g/dL (ref 13.0–17.0)
MCH: 31.9 pg (ref 26.0–34.0)
MCHC: 30.7 g/dL (ref 30.0–36.0)
MCV: 103.7 fL — ABNORMAL HIGH (ref 80.0–100.0)
Platelets: 91 10*3/uL — ABNORMAL LOW (ref 150–400)
RBC: 5.18 MIL/uL (ref 4.22–5.81)
RDW: 15.7 % — ABNORMAL HIGH (ref 11.5–15.5)
WBC: 6.5 10*3/uL (ref 4.0–10.5)
nRBC: 0 % (ref 0.0–0.2)

## 2022-11-20 LAB — TROPONIN I (HIGH SENSITIVITY)
Troponin I (High Sensitivity): 57 ng/L — ABNORMAL HIGH (ref <18)
Troponin I (High Sensitivity): 60 ng/L — ABNORMAL HIGH (ref <18)

## 2022-11-20 LAB — BASIC METABOLIC PANEL
Anion gap: 15 (ref 5–15)
BUN: 73 mg/dL — ABNORMAL HIGH (ref 8–23)
CO2: 31 mmol/L (ref 22–32)
Calcium: 9.4 mg/dL (ref 8.9–10.3)
Chloride: 95 mmol/L — ABNORMAL LOW (ref 98–111)
Creatinine, Ser: 3.33 mg/dL — ABNORMAL HIGH (ref 0.61–1.24)
GFR, Estimated: 18 mL/min — ABNORMAL LOW (ref >60)
Glucose, Bld: 108 mg/dL — ABNORMAL HIGH (ref 70–99)
Potassium: 3.9 mmol/L (ref 3.5–5.1)
Sodium: 141 mmol/L (ref 135–145)

## 2022-11-20 LAB — MAGNESIUM: Magnesium: 2.5 mg/dL — ABNORMAL HIGH (ref 1.7–2.4)

## 2022-11-20 LAB — BRAIN NATRIURETIC PEPTIDE: B Natriuretic Peptide: 2538.1 pg/mL — ABNORMAL HIGH (ref 0.0–100.0)

## 2022-11-20 LAB — PROTIME-INR
INR: 1.3 — ABNORMAL HIGH (ref 0.8–1.2)
Prothrombin Time: 15.6 seconds — ABNORMAL HIGH (ref 11.4–15.2)

## 2022-11-20 MED ORDER — POTASSIUM CHLORIDE CRYS ER 20 MEQ PO TBCR
40.0000 meq | EXTENDED_RELEASE_TABLET | Freq: Once | ORAL | Status: AC
  Administered 2022-11-20: 40 meq via ORAL
  Filled 2022-11-20: qty 2

## 2022-11-20 MED ORDER — SODIUM CHLORIDE 0.9% FLUSH: 3.0000 mL | INTRAVENOUS | Status: DC | PRN

## 2022-11-20 MED ORDER — SODIUM CHLORIDE 0.9 % IV SOLN: 250.0000 mL | INTRAVENOUS | Status: DC | PRN

## 2022-11-20 MED ORDER — ASPIRIN 81 MG PO TBEC
81.0000 mg | DELAYED_RELEASE_TABLET | Freq: Every day | ORAL | Status: DC
  Administered 2022-11-21 – 2022-11-27 (×7): 81 mg via ORAL
  Filled 2022-11-20 (×7): qty 1

## 2022-11-20 MED ORDER — MIDODRINE HCL 5 MG PO TABS
5.0000 mg | ORAL_TABLET | Freq: Three times a day (TID) | ORAL | Status: DC
  Administered 2022-11-20 – 2022-11-27 (×21): 5 mg via ORAL
  Filled 2022-11-20 (×21): qty 1

## 2022-11-20 MED ORDER — PANTOPRAZOLE SODIUM 40 MG PO TBEC
40.0000 mg | DELAYED_RELEASE_TABLET | Freq: Every day | ORAL | Status: DC
  Administered 2022-11-21 – 2022-11-27 (×7): 40 mg via ORAL
  Filled 2022-11-20 (×7): qty 1

## 2022-11-20 MED ORDER — ENOXAPARIN SODIUM 40 MG/0.4ML IJ SOSY: 40.0000 mg | PREFILLED_SYRINGE | INTRAMUSCULAR | Status: DC

## 2022-11-20 MED ORDER — FUROSEMIDE 10 MG/ML IJ SOLN
160.0000 mg | Freq: Once | INTRAVENOUS | Status: AC
  Administered 2022-11-20: 160 mg via INTRAVENOUS
  Filled 2022-11-20: qty 16

## 2022-11-20 MED ORDER — IPRATROPIUM-ALBUTEROL 0.5-2.5 (3) MG/3ML IN SOLN: 3.0000 mL | Freq: Four times a day (QID) | RESPIRATORY_TRACT | Status: DC | PRN

## 2022-11-20 MED ORDER — ACETAMINOPHEN 325 MG PO TABS: 650.0000 mg | ORAL_TABLET | ORAL | Status: DC | PRN

## 2022-11-20 MED ORDER — ONDANSETRON HCL 4 MG/2ML IJ SOLN: 4.0000 mg | Freq: Four times a day (QID) | INTRAMUSCULAR | Status: DC | PRN

## 2022-11-20 MED ORDER — ENOXAPARIN SODIUM 40 MG/0.4ML IJ SOSY
40.0000 mg | PREFILLED_SYRINGE | INTRAMUSCULAR | Status: DC
  Administered 2022-11-20 – 2022-11-26 (×7): 40 mg via SUBCUTANEOUS
  Filled 2022-11-20 (×7): qty 0.4

## 2022-11-20 MED ORDER — SODIUM CHLORIDE 0.9% FLUSH
3.0000 mL | Freq: Two times a day (BID) | INTRAVENOUS | Status: DC
  Administered 2022-11-20 – 2022-11-24 (×9): 3 mL via INTRAVENOUS

## 2022-11-20 MED ORDER — FUROSEMIDE 10 MG/ML IJ SOLN
60.0000 mg | Freq: Once | INTRAMUSCULAR | Status: AC
  Administered 2022-11-20: 60 mg via INTRAVENOUS
  Filled 2022-11-20: qty 8

## 2022-11-20 MED ORDER — FUROSEMIDE 10 MG/ML IJ SOLN
8.0000 mg/h | INTRAVENOUS | Status: DC
  Administered 2022-11-20: 4 mg/h via INTRAVENOUS
  Administered 2022-11-22 – 2022-11-27 (×5): 8 mg/h via INTRAVENOUS
  Filled 2022-11-20 (×6): qty 20

## 2022-11-20 NOTE — Assessment & Plan Note (Signed)
-   Continue home supplemental oxygen to maintain oxygen saturation above 88% 

## 2022-11-20 NOTE — ED Notes (Signed)
Green top tube and blue top sent to lab. Second trop sent.

## 2022-11-20 NOTE — Assessment & Plan Note (Addendum)
Chronic thrombocytopenia.  Hepatitis C negative.  Hepatitis B negative.  Ultrasound right upper quadrant concerning for cirrhosis.  Will empirically start folic acid with MCV being high.  Will also start lactulose.

## 2022-11-20 NOTE — ED Provider Notes (Signed)
[facility] Provider Note    Event Date/Time   First MD Initiated Contact with Patient 11/20/22 1235     (approximate)   History   Shortness of Breath   HPI  John [name] is a 76 y.o. male   Past medical history of COPD, CHF, CKD hyperlipidemia and hypertension who presents to the emergency department from his cardiology clinic for admission with peripheral edema. He has had peripheral edema and about a 10 pound weight gain.  He denies chest pain or respiratory symptoms.  He is not hypoxemic.  He is in no respiratory distress and has a normal sounding lung exam.  He does have peripheral edema but also has an AKI with a creatinine in the threes with a baseline of 1.  He states that he has been compliant with his diuretics at home and has been minimal water intake due to doctor instructions.  He has no other complaints at this time.  History was obtained via the patient.  Collateral information was obtained from independent historian wife who is at bedside and corroborates information as above.      Physical Exam   Triage Vital Signs: ED Triage Vitals  Enc Vitals Group     BP 11/20/22 1127 112/82     Pulse Rate 11/20/22 1127 81     Resp 11/20/22 1127 17     Temp 11/20/22 1127 98.5 F (36.9 C)     Temp src --      SpO2 11/20/22 1127 99 %     Weight 11/20/22 1128 229 lb (103.9 kg)     Height 11/20/22 1128 '5\' 10"'$  (1.778 m)     Head Circumference --      Peak Flow --      Pain Score 11/20/22 1113 0     Pain Loc --      Pain Edu? --      Excl. in [place]? --     Most recent vital signs: Vitals:   11/20/22 1430 11/20/22 1515  BP: 114/87   Pulse: 72 70  Resp: 12 20  Temp:    SpO2: (!) 87% 99%    General: Awake, no distress.  CV:  Good peripheral perfusion.  Resp:  Normal effort.  Abd:  No distention.  Other:  He has peripheral edema notably pitting edema to bilateral lower extremities and some abdominal distention.   ED Results /  Procedures / Treatments   Labs (all labs ordered are listed, but only abnormal results are displayed) Labs Reviewed  BASIC METABOLIC PANEL - Abnormal; Notable for the following components:      Result Value   Chloride 95 (*)    Glucose, Bld 108 (*)    BUN 73 (*)    Creatinine, Ser 3.33 (*)    GFR, Estimated 18 (*)    All other components within normal limits  CBC - Abnormal; Notable for the following components:   HCT 53.7 (*)    MCV 103.7 (*)    RDW 15.7 (*)    Platelets 91 (*)    All other components within normal limits  BRAIN NATRIURETIC PEPTIDE - Abnormal; Notable for the following components:   B Natriuretic Peptide 2,538.1 (*)    All other components within normal limits  TROPONIN I (HIGH SENSITIVITY) - Abnormal; Notable for the following components:   Troponin I (High Sensitivity) 57 (*)    All other components within normal limits  PROTIME-INR  MAGNESIUM  TROPONIN I (HIGH  SENSITIVITY)     I reviewed labs and they are notable for creatinine of 3.33 from a baseline of ones and an elevated BNP of 2500  EKG  ED ECG REPORT I, Lucillie Garfinkel, the attending physician, personally viewed and interpreted this ECG.   Date: 11/20/2022  EKG Time: 1134  Rate: 80  Rhythm: normal sinus rhythm  Axis: rad  Intervals:none  ST&T Change: No acute ischemic changes.    RADIOLOGY I independently reviewed and interpreted chest x-ray and see no obvious focal opacities or pneumothorax   PROCEDURES:  Critical Care performed: No  Procedures   MEDICATIONS ORDERED IN ED: Medications  sodium chloride flush (NS) 0.9 % injection 3 mL (3 mLs Intravenous Given 11/20/22 1524)  sodium chloride flush (NS) 0.9 % injection 3 mL (has no administration in time range)  0.9 %  sodium chloride infusion (has no administration in time range)  acetaminophen (TYLENOL) tablet 650 mg (has no administration in time range)  ondansetron (ZOFRAN) injection 4 mg (has no administration in time range)   furosemide (LASIX) 160 mg in dextrose 5 % 50 mL IVPB (has no administration in time range)  furosemide (LASIX) 200 mg in dextrose 5 % 100 mL (2 mg/mL) infusion (has no administration in time range)  enoxaparin (LOVENOX) injection 40 mg (has no administration in time range)  aspirin EC tablet 81 mg (has no administration in time range)  ipratropium-albuterol (DUONEB) 0.5-2.5 (3) MG/3ML nebulizer solution 3 mL (has no administration in time range)  midodrine (PROAMATINE) tablet 5 mg (has no administration in time range)  pantoprazole (PROTONIX) EC tablet 40 mg (has no administration in time range)  potassium chloride SA (KLOR-CON M) CR tablet 40 mEq (has no administration in time range)  furosemide (LASIX) injection 60 mg (60 mg Intravenous Given 11/20/22 1522)    Consultants:  I spoke with hospitalist for admission and regarding care plan for this patient.   IMPRESSION / MDM / ASSESSMENT AND PLAN / ED COURSE  I reviewed the triage vital signs and the nursing notes.                              Differential diagnosis includes, but is not limited to, exacerbation, peripheral edema, lymphedema, electrolyte derangement, AKI, ACS   The patient is on the cardiac monitor to evaluate for evidence of arrhythmia and/or significant heart rate changes.  MDM: Patient with request to be admitted by his cardiology clinic who is hemodynamically stable and has no respiratory distress and no evidence of pulmonary edema on my auscultation and is hide not hypoxemic.  He does seem to have peripheral edema and has weight gain.  Given his AKI and stability at this time I will defer further management until he is admitted to hospitalist service and close consultation with cardiology.   Patient's presentation is most consistent with acute presentation with potential threat to life or bodily function.       FINAL CLINICAL IMPRESSION(S) / ED DIAGNOSES   Final diagnoses:  Peripheral edema     Rx / DC  Orders   ED Discharge Orders     None        Note:  This document was prepared using Dragon voice recognition software and may include unintentional dictation errors.    Lucillie Garfinkel, MD 11/20/22 1550

## 2022-11-20 NOTE — Progress Notes (Signed)
Patient ID: John [name], male    DOB: Mar 03, 1946, 76 y.o.   MRN: [id number]   John [name] is a 76 y/o male with a history of HTN, CKD, COPD, hyperlipidemia, remote tobacco use and chronic heart failure.   Echo report from 10/02/22 showed an EF of 50-55% along with mild LVH, moderately enlarged right ventricle, mild LAE, moderate RAE and mild/ moderate TR. Echo report from 01/29/22 reviewed and showed an EF of 55-60% along with moderately elevated PA pressure and mild/moderate TR.   Admitted 10/02/22 due to progressive lower extremity edema and weight gain. Initially given IV lasix. Found to be hypoxic needing O2 at 2L. Nephrology consult obtained. Transitioned to lasix drip and then able to transition to oral diuretics. Metolazone twice weekly started. Losartan stopped. Antibiotics given for right hand cellulitis. PT/OT evaluations done. Discharged after 13 days.   He presents today for a follow-up visit with a chief complaint of moderate fatigue with minimal exertion. He has associated shortness of breath, pedal edema, abdominal distention, scrotal swelling and difficulty sleeping along with this. He denies palpitations, chest pain, wheezing, cough, dizziness or weight gain from yesterday.   Received IV lasix yesterday without change of symptoms. BMP showed AKI and BNP is also elevated from previous visit. He was called yesterday and advised of results but did not want to go to the ER yesterday as he wanted time to think about it.   Initial room air sat was 68% and then 2L applied and it rose to 94%.  Yesterday he said that he is drinking very little fluids because he was trying to keep the fluid retention from returning.   Past Medical History:  Diagnosis Date   CHF (congestive heart failure) (HCC)    EF 45% in 2021   Chronic kidney disease    COPD (chronic obstructive pulmonary disease) ([place]) 02/16/2020   Hyperlipidemia    Hypertension    Pneumonia 2021    Past Surgical History:   Procedure Laterality Date   CATARACT EXTRACTION W/PHACO Left 10/31/2021   Procedure: CATARACT EXTRACTION PHACO AND INTRAOCULAR LENS PLACEMENT ([place]) LEFT VISION BLUE 3.45 00:48.0;  Surgeon: Leandrew Koyanagi, MD;  Location: [place];  Service: Ophthalmology;  Laterality: Left;   CATARACT EXTRACTION W/PHACO Right 11/14/2021   Procedure: CATARACT EXTRACTION PHACO AND INTRAOCULAR LENS PLACEMENT ([facility]) RIGHT 8.59 01:10.5;  Surgeon: Leandrew Koyanagi, MD;  Location: [place];  Service: Ophthalmology;  Laterality: Right;   INGUINAL HERNIA REPAIR Right 02/04/2017   Procedure: HERNIA REPAIR INGUINAL ADULT;  Surgeon: Leonie Green, MD;  Location: [facility];  Service: General;  Laterality: Right;   NO PAST SURGERIES     QUADRICEPS TENDON REPAIR Left 02/15/2020   Procedure: REPAIR QUADRICEP TENDON;  Surgeon: Corky Mull, MD;  Location: [facility];  Service: Orthopedics;  Laterality: Left;   Family History  Problem Relation Age of Onset   Diabetes Mother    Diabetes Brother    Diabetes Maternal Grandmother    Diabetes Paternal Grandmother    Social History   Tobacco Use   Smoking status: Former    Packs/day: 0.25    Years: 53.00    Total pack years: 13.25    Types: Cigarettes    Quit date: 02/15/2015    Years since quitting: 7.7   Smokeless tobacco: Never  Substance Use Topics   Alcohol use: Yes    Comment: occassional   No Known Allergies  Prior to Admission medications   Medication Sig Start Date  End Date Taking? Authorizing Provider  aspirin EC 81 MG tablet Take 1 tablet (81 mg total) by mouth daily. Swallow whole. 03/28/22  Yes Walda Hertzog, Otila Kluver A, FNP  calcitRIOL (ROCALTROL) 0.25 MCG capsule Take 0.25 mcg by mouth daily.   Yes [provider]  gabapentin (NEURONTIN) 100 MG capsule Take 1 capsule (100 mg total) by mouth 2 (two) times daily. 10/15/22  Yes [place] Eric J, DO  ipratropium-albuterol (DUONEB) 0.5-2.5 (3) MG/3ML SOLN Inhale 3 mLs into the  lungs every 6 (six) hours as needed (wheezing).   Yes [provider]  metolazone (ZAROXOLYN) 2.5 MG tablet Take 1 tablet (2.5 mg total) by mouth 2 (two) times a week. 10/15/22  Yes [place] Eric J, DO  midodrine (PROAMATINE) 5 MG tablet Take 1 tablet (5 mg total) by mouth 3 (three) times daily with meals. 10/15/22  Yes [place] Eric J, DO  omeprazole (PRILOSEC) 20 MG capsule Take 20 mg by mouth daily. 11/04/22 11/04/23 Yes [provider]  torsemide (DEMADEX) 20 MG tablet Take 1 tablet (20 mg total) by mouth daily. 10/15/22  Yes [place] Donnamarie Poag, DO   Review of Systems  Constitutional:  Positive for fatigue (easily). Negative for appetite change.  HENT:  Negative for congestion, postnasal drip and sore throat.   Eyes: Negative.   Respiratory:  Positive for shortness of breath. Negative for cough and wheezing.   Cardiovascular:  Positive for leg swelling. Negative for chest pain and palpitations.  Gastrointestinal:  Positive for abdominal distention. Negative for abdominal pain.  Endocrine: Negative.   Genitourinary:  Positive for scrotal swelling.  Musculoskeletal:  Negative for arthralgias, back pain and neck pain.  Skin: Negative.   Allergic/Immunologic: Negative.   Neurological:  Negative for dizziness and light-headedness.  Hematological:  Negative for adenopathy. Does not bruise/bleed easily.  Psychiatric/Behavioral:  Positive for sleep disturbance (chronic; sleeping on 2 pillows). Negative for dysphoric mood. The patient is not nervous/anxious.    Vitals:   11/20/22 1038  BP: (!) 121/91  Pulse: 80  Resp: 16  SpO2: 94%  Weight: 229 lb (103.9 kg)   Wt Readings from Last 3 Encounters:  11/20/22 229 lb (103.9 kg)  11/19/22 209 lb (94.8 kg)  10/15/22 215 lb 14.4 oz (97.9 kg)   Lab Results  Component Value Date   CREATININE 3.40 (H) 11/19/2022   CREATININE 1.67 (H) 10/15/2022   CREATININE 1.68 (H) 10/13/2022    Physical Exam Vitals and nursing note  reviewed. Exam conducted with a chaperone present (friend).  Constitutional:      Appearance: Normal appearance.  HENT:     Head: Normocephalic and atraumatic.  Cardiovascular:     Rate and Rhythm: Normal rate and regular rhythm.  Pulmonary:     Effort: Pulmonary effort is normal. No respiratory distress.     Breath sounds: No wheezing or rales.     Comments: Diminished in bases Abdominal:     General: There is distension.     Palpations: Abdomen is soft.  Musculoskeletal:        General: No tenderness.     Cervical back: Normal range of motion and neck supple.     Right lower leg: Edema (3+ pitting into thighs) present.     Left lower leg: Edema (3+ pitting into thighs) present.  Skin:    General: Skin is warm and dry.  Neurological:     General: No focal deficit present.     Mental Status: He is alert and oriented to person, place,  and time.  Psychiatric:        Mood and Affect: Mood normal.        Behavior: Behavior normal.        Thought Content: Thought content normal.   Assessment & Plan:  1: Acute on Chronic heart failure with preserved ejection fraction with structural changes (LAE)- - NYHA class III - continues to be very fluid overloaded today  - weighing daily and understands to call for an overnight weight gain of > 2 pounds or a weekly weight gain of > 5 pounds - weight today is 229 pounds (re-weighed to verify); weight yesterday was probably inaccurate; weight in the office 3 months ago was 203 pounds (increase of 26 pounds) - sent for '80mg'$  IV lasix/ 71mq potassium yesterday - BMP done yesterday showed AKI; BNP elevated - initially hypoxic on room air at 68% and O2 @ 2L applied with improvement to 94% - he was called yesterday and advised to go to the ER but he said he needed time to think about it; reviewed lab results with him and his friend and explained that he needed to go to the ER today - not adding salt to his food  - has fluid restriction of 32 ounces  from nephrology but patient says that he's not even drinking that much because he was afraid of retaining fluids; says that he drinks some fluids when he takes his medicine and "that's about it' - BNP 11/19/22 was 2861.1; previously was 1103.2  2: HTN with CKD- - BP looks good (121/91) - saw PCP (Vidal Schwalbe 10/30/22 - saw nephrology (Holley Raring 10/01/22  - BMP 11/19/22 reviewed and showed sodium 140, potassium 3.5, creatinine 3.4 and GFR 18  3: COPD- - has oxygen at home that he wears at 2L PRN; placed on 2L while in the office - quit smoking ~ 10 years ago   Medication bottles reviewed.   After discussion with patient and friend today, he is agreeable to going to the ER. Also discussed having him see Dr. BHaroldine Lawsas a follow-up and he was agreeable to this. Hospital employee took patient to the ER in a wheelchair with 2L of oxygen on.

## 2022-11-20 NOTE — Consult Note (Signed)
[place] NOTE       Patient ID: John [name] MRN: [id number] DOB/AGE: Feb 13, 1946 76 y.o.  Admit date: 11/20/2022 Referring Physician Dr. Charleen Kirks Primary Physician Dr. Ginette Pitman Primary Cardiologist Dr. Nehemiah Massed (last seen in clinic 2021)  Reason for Consultation AoCHF  HPI: John [name] is a 66yoM with a PMH of HFpEF (50-55%, G1 DD with a D-shaped LV c/w RV overload, mi-mod TR 10/02/2022), COPD with as needed 2 L, CKD 4, chronic thrombocytopenia, HTN, hx tobacco use who presented to [facility] ED on 11/20/2022 shortness of breath and worsening peripheral edema. Cardiology was consulted for further assistance with his heart failure.  He presents today with weight gain and progressively worsening scrotal swelling and leg swelling, refractory to IV Lasix at the heart failure clinic.  He says that since his discharge from his last admission from 10/19 - 10/31 his weight has progressively increased despite compliance with his diuretics, although he reports less than optimal fluid intake.  Denies adding salt to his foods but recently has eaten meatloaf with gravy and mashed potatoes from a restaurant called "[employer]."  Weight on admission today is 103.9 kg, and at his hospital discharge follow-up with his PCP on 11/10 he was down to 92.9 kg.  He does not currently feel short of breath, stating that this seems to wax and wane and improved somewhat with his inhalers and not necessarily supplemental oxygen which she only seems to use as needed.  He denies any chest pain, heart racing, dizziness or presyncopal symptoms.  No orthopnea or PND. he does not have any known history of sleep apnea does not know if he snores at night or not.  He does not have a pulmonologist.  He has not had any change in the frequency or the amount that he is urinating.  His primary concern is his scrotal swelling, which he says is also happened in the setting of his previous heart failure exacerbations and  volume overload and has resolved after diuresis.  At my time of evaluation he is hemodynamically stable, although has some conversational dyspnea but is not hypoxic on supplemental oxygen.  His blood pressure is 114/87, and he is in sinus rhythm with occasional PVCs on telemetry with rate in the 70s and 80s.  Labs arrival for potassium 3.9, BUN/creatinine 73/3.33 and GFR of 18.  BNP markedly elevated at 2538, compared to 1103 during his last admission in October.  High-sensitivity troponin on first check minimally elevated at 57 which repeats pending.  H&H 16/57.3 and his chronic thrombocytopenia with platelets 91,000.  Chest x-ray with cardiomegaly and pulmonary vascular congestion and tiny bilateral pleural effusions with hyperinflation.  Review of systems complete and found to be negative unless listed above     Past Medical History:  Diagnosis Date   CHF (congestive heart failure) (HCC)    EF 45% in 2021   Chronic kidney disease    COPD (chronic obstructive pulmonary disease) ([place]) 02/16/2020   Hyperlipidemia    Hypertension    Pneumonia 2021    Past Surgical History:  Procedure Laterality Date   CATARACT EXTRACTION W/PHACO Left 10/31/2021   Procedure: CATARACT EXTRACTION PHACO AND INTRAOCULAR LENS PLACEMENT ([place]) LEFT VISION BLUE 3.45 00:48.0;  Surgeon: Leandrew Koyanagi, MD;  Location: [place];  Service: Ophthalmology;  Laterality: Left;   CATARACT EXTRACTION W/PHACO Right 11/14/2021   Procedure: CATARACT EXTRACTION PHACO AND INTRAOCULAR LENS PLACEMENT ([facility]) RIGHT 8.59 01:10.5;  Surgeon: Leandrew Koyanagi, MD;  Location: [place];  Service: Ophthalmology;  Laterality: Right;   INGUINAL HERNIA REPAIR Right 02/04/2017   Procedure: HERNIA REPAIR INGUINAL ADULT;  Surgeon: Leonie Green, MD;  Location: [facility];  Service: General;  Laterality: Right;   NO PAST SURGERIES     QUADRICEPS TENDON REPAIR Left 02/15/2020   Procedure: REPAIR QUADRICEP TENDON;   Surgeon: Corky Mull, MD;  Location: [facility];  Service: Orthopedics;  Laterality: Left;    (Not in a hospital admission)  Social History   Socioeconomic History   Marital status: Single    Spouse name: Not on file   Number of children: Not on file   Years of education: Not on file   Highest education level: Not on file  Occupational History   Not on file  Tobacco Use   Smoking status: Former    Packs/day: 0.25    Years: 53.00    Total pack years: 13.25    Types: Cigarettes    Quit date: 02/15/2015    Years since quitting: 7.7   Smokeless tobacco: Never  Substance and Sexual Activity   Alcohol use: Yes    Comment: occassional   Drug use: No   Sexual activity: Not on file  Other Topics Concern   Not on file  Social History Narrative   Not on file   Social Determinants of Health   Financial Resource Strain: Low Risk  (07/17/2022)   Overall Financial Resource Strain (CARDIA)    Difficulty of Paying Living Expenses: Not hard at all  Food Insecurity: No Food Insecurity (11/20/2022)   Hunger Vital Sign    Worried About Running Out of Food in the Last Year: Never true    Ran Out of Food in the Last Year: Never true  Transportation Needs: No Transportation Needs (11/20/2022)   PRAPARE - Hydrologist (Medical): No    Lack of Transportation (Non-Medical): No  Physical Activity: Sufficiently Active (07/17/2022)   Exercise Vital Sign    Days of Exercise per Week: 5 days    Minutes of Exercise per Session: 60 min  Stress: No Stress Concern Present (07/17/2022)   [place]    Feeling of Stress : Not at all  Social Connections: [place] (07/17/2022)   Social Connection and Isolation Panel [NHANES]    Frequency of Communication with Friends and Family: More than three times a week    Frequency of Social Gatherings with Friends and Family: More than three times a week    Attends  Religious Services: 1 to 4 times per year    Active Member of [employer] or Organizations: Yes    Attends Archivist Meetings: More than 4 times per year    Marital Status: Living with partner  Intimate Partner Violence: Not At Risk (11/20/2022)   Humiliation, Afraid, Rape, and Kick questionnaire    Fear of Current or Ex-Partner: No    Emotionally Abused: No    Physically Abused: No    Sexually Abused: No    Family History  Problem Relation Age of Onset   Diabetes Mother    Diabetes Brother    Diabetes Maternal Grandmother    Diabetes Paternal Grandmother      No intake or output data in the 24 hours ending 11/20/22 1454  Vitals:   11/20/22 1127 11/20/22 1128 11/20/22 1300  BP: 112/82  110/81  Pulse: 81  78  Resp: 17  20  Temp: 98.5 F (36.9  C)    SpO2: 99%  98%  Weight:  103.9 kg   Height:  '5\' 10"'$  (1.778 m)     PHYSICAL EXAM General: Elderly black male, well nourished, in no acute distress.  Laying inclined in ED stretcher without family at bedside. HEENT:  Normocephalic and atraumatic. Neck:  No JVD.  Lungs: Slight conversational dyspnea and oxygen by nasal cannula, speaks in short phrases.  Decreased breath sounds bilaterally without appreciable wheezes.   Heart: HRRR . Normal S1 and S2 without gallops or murmurs.  Abdomen: Nontender to palpation, distended appearing. GU: Softball sized scrotal swelling with pitting edema Msk: Normal strength and tone for age. Extremities: Warm and well perfused. No clubbing, cyanosis.  Generally edematous bilateral lower extremities with 1+ pitting pedal edema and brawny hyperpigmentation to the shins.  Neuro: Alert and oriented X 3. Psych:  Answers questions appropriately.   Labs: Basic Metabolic Panel: Recent Labs    11/19/22 1333 11/20/22 1217  NA 140 141  K 3.5 3.9  CL 96* 95*  CO2 35* 31  GLUCOSE 132* 108*  BUN 70* 73*  CREATININE 3.40* 3.33*  CALCIUM 9.4 9.4   Liver Function Tests: No results for input(s):  "AST", "ALT", "ALKPHOS", "BILITOT", "PROT", "ALBUMIN" in the last 72 hours. No results for input(s): "LIPASE", "AMYLASE" in the last 72 hours. CBC: Recent Labs    11/20/22 1217  WBC 6.5  HGB 16.5  HCT 53.7*  MCV 103.7*  PLT 91*   Cardiac Enzymes: Recent Labs    11/20/22 1217  TROPONINIHS 57*   BNP: Recent Labs    11/19/22 1333 11/20/22 1217  BNP 2,861.1* 2,538.1*   D-Dimer: No results for input(s): "DDIMER" in the last 72 hours. Hemoglobin A1C: No results for input(s): "HGBA1C" in the last 72 hours. Fasting Lipid Panel: No results for input(s): "CHOL", "HDL", "LDLCALC", "TRIG", "CHOLHDL", "LDLDIRECT" in the last 72 hours. Thyroid Function Tests: No results for input(s): "TSH", "T4TOTAL", "T3FREE", "THYROIDAB" in the last 72 hours.  Invalid input(s): "FREET3" Anemia Panel: No results for input(s): "VITAMINB12", "FOLATE", "FERRITIN", "TIBC", "IRON", "RETICCTPCT" in the last 72 hours.   Radiology: DG Chest 2 View  Result Date: 11/20/2022 CLINICAL DATA:  Shortness of breath.  Heart failure. EXAM: CHEST - 2 VIEW COMPARISON:  10/02/2022 FINDINGS: Lateral view degraded by patient arm position. Midline trachea. Mild cardiomegaly. Atherosclerosis in the transverse aorta. Tiny bilateral pleural effusions. No pneumothorax. Interstitial prominence and indistinctness is increased. Mild bibasilar atelectasis. IMPRESSION: Cardiomegaly with mild pulmonary venous congestion superimposed upon COPD/chronic bronchitis. Tiny bilateral pleural effusions. Aortic Atherosclerosis (ICD10-I70.0). Electronically Signed   By: Abigail Miyamoto M.D.   On: 11/20/2022 12:08    ECHO 10/02/2022  1. Left ventricular ejection fraction, by estimation, is 50 to 55%. The  left ventricle has low normal function. The left ventricle has no regional  wall motion abnormalities. There is mild left ventricular hypertrophy.  Left ventricular diastolic  parameters are consistent with Grade I diastolic dysfunction  (impaired  relaxation). There is the interventricular septum is flattened in diastole  ('D' shaped left ventricle), consistent with right ventricular volume  overload.   2. Right ventricular systolic function is moderately reduced. The right  ventricular size is moderately enlarged.   3. Left atrial size was mildly dilated.   4. Right atrial size was moderately dilated.   5. The mitral valve is normal in structure. No evidence of mitral valve  regurgitation.   6. Tricuspid valve regurgitation is mild to moderate.   7. The  aortic valve is normal in structure. Aortic valve regurgitation is  not visualized.   TELEMETRY reviewed by me (LT) 11/20/2022 : Sinus rhythm, low voltage rate 70s-80s with occasional PVCs  EKG reviewed by me: NSR rate 80 low voltage, nonspecific IVCD  Data reviewed by me (LT) 11/20/2022: Nephrology note, discharge summary from October 2023, CBC and BMP vitals chest x-ray telemetry EKG  Principal Problem:   Acute on chronic heart failure (HCC)    ASSESSMENT AND PLAN:  Ashdon Gillson is a 78yoM with a PMH of HFpEF (50-55%, G1 DD with a D-shaped LV c/w RV overload, mi-mod TR 10/02/2022), COPD, CKD 4, chronic thrombocytopenia, HTN who presented to [facility] ED on 11/20/2022 shortness of breath and worsening peripheral edema. Cardiology was consulted for further assistance with his heart failure.  # acute on chronic HFpEF (50-55%, g1dd 09/2022) Presents with progressive weight gain and scrotal swelling primarily following discharge from his last 12 day hospitalization (discharge on 10/31) despite compliance with his oral diuretics and IV Lasix at the heart failure clinic. Lowest weight he noted was 202lbs, on admission 12/6 is 228lbs. BNP on admission the highest it has been over the past year at 2500 and he appears clinically grossly volume overloaded.  Suspect poor p.o. fluid intake and dietary indiscretions playing a role in this exacerbation with deteriorating renal function  also possibly contributory. -Give 60 mg of IV Lasix before starting continuous Lasix infusion, he required days of this during his past admission in October. -Outpatient diuretics: torsemide 20 mg daily & metolazone 2.5 mg twice weekly -consider addition of BB, spironolactone if renal function and BP allow. Was recently taken off of losartan d/t hypotension  -consider repeat limited echo -Discussed the importance of reading food labels and salt restriction -Strict I's/O, daily weights  # AKI on CKD 4  Worsening Cr since 10/31 and trend 1.67 -- 2.2 -- 3.3 on admission today with current GFR 18.  -Continue diuresis as above, consider nephrology input if renal function worsens.  # COPD Is not followed by pulmonology on an outpatient basis, has 2 L he seems to use intermittently.  Continue inhalers.  # demand ischemia Troponin is minimally elevated at 57 with repeats pending, this most likely represents demand/supply mismatch and not ACS in the absence of EKG changes or chest pain and in the setting of AKI -continue aspirin '81mg'$  daily  This patient's plan of care was discussed and created with Dr. Nehemiah Massed and he is in agreement.  Signed: Tristan Schroeder , PA-C 11/20/2022, 2:54 PM [facility]

## 2022-11-20 NOTE — Assessment & Plan Note (Signed)
No evidence of acute exacerbation at this time.  No wheezing on examination.  - Continue home DuoNebs

## 2022-11-20 NOTE — ED Triage Notes (Addendum)
Pt comes with c/o sob and retaining fluids. Pt brought over from heart failure clinic to be admitted. Pt wears 2 L  all the time.  Pt states he doesn't feel sob. Pt denies any CP or pain anywhere.   Pt does have noticeable swelling to arms and legs with some pitting edema.

## 2022-11-20 NOTE — ED Provider Triage Note (Signed)
Emergency Medicine Provider Triage Evaluation Note  Zerick Prevette, a 76 y.o. male with a history of CHF, CKD, COPD, who presents to the ED from the heart failure clinic, who was evaluated in triage.  Pt complains of shortness of breath and fluid retention.  The provider at the heart failure clinic as sent the patient to the ED for admission.  For CHF exacerbation.  Any pain or any frank shortness of breath.  Review of Systems  Positive: Fluid retention Negative: CP  Physical Exam  There were no vitals taken for this visit. Gen:   Awake, no distress  NAD Resp:  Normal effort  MSK:   Moves extremities without difficulty. BLE pitting edema Other:    Medical Decision Making  Medically screening exam initiated at 11:25 AM.  Appropriate orders placed.  [name] was informed that the remainder of the evaluation will be completed by another provider, this initial triage assessment does not replace that evaluation, and the importance of remaining in the ED until their evaluation is complete.  Patient to the ED for evaluation of fluid retention with a history of CHF, COPD, CKD   Dozier Berkovich, Dannielle Karvonen, PA-C 11/20/22 1127

## 2022-11-20 NOTE — ED Notes (Signed)
Pt sitting on side of bed when this RN came into the room. O2 sats were 78%. Had pt take several deep breaths and after several minutes his O2 sat came up to 92. After another several minutes his O2 sat was 96%.

## 2022-11-20 NOTE — ED Notes (Signed)
Spoke with pt and wife. Pt wears 2L chronic. Sent from HF clinic for worsening renal labs drawn yesterday. Gave gingerale with ice per request. Pt declined graham crackers.

## 2022-11-20 NOTE — Assessment & Plan Note (Deleted)
Patient presenting with gradual onset worsening lower extremity edema that has quickly worsened over the last 48 hours and has been unresponsive to IV Lasix push in the outpatient setting.  Per chart review, patient has a history of HFpEF with preserved left EF, but significant right ventricular dysfunction with elevated PASP, concerning for pulmonary hypertension.  No prior right or left heart cath for further evaluation.  - Cardiology consulted; appreciate their recommendations - Lasix 160 mg IV push followed by Lasix infusion - Will follow-up with cardiology regarding restarting metolazone - Daily weights - Strict in and out - No GDMT at this time due to hypotension requiring midodrine - No indication at this time to repeat echo - Will follow-up with cardiology if patient would benefit from right heart cath to evaluate pulmonary hypertension

## 2022-11-20 NOTE — Assessment & Plan Note (Addendum)
Acute kidney injury on CKD stage IIIb.  Creatinine 3.4 on presentation and last creatinine 2.28.  Appreciate nephrology consultation.

## 2022-11-20 NOTE — H&P (Signed)
History and Physical    Patient: John [name] PNT:[id number] DOB: 1946-10-05 DOA: 11/20/2022 DOS: the patient was seen and examined on 11/20/2022 PCP: Tracie Harrier, MD  Patient coming from: Home  Chief Complaint: Increased lower extremity swelling  HPI: John [name] is a 76 y.o. male with medical history significant of chronic diastolic heart failure, chronic hypoxic respiratory failure on 2 L nasal cannula, COPD, hypertension, CKD 2/2 cardiorenal syndrome, who presents to the ED with complaints of increased lower extremity swelling.  [name] states that he has noticed over the last couple weeks, he has been having increasing lower extremity edema extending into his abdomen.  He also endorses fatigue with exertion but denies any dyspnea on exertion.  He notes that symptoms got significantly worse in the last 2 days.  He went to the heart failure clinic yesterday and was given a one-time dose of Lasix 80 mg IV.  He states in the last 24 hours, he has not noted any improvement with the swelling.  He is having significant scrotal swelling that is leading to difficulty with urination.  He denies any other complaints at this time including fever, chills, shortness of breath, nausea, vomiting, diarrhea.  Per chart review, BNP significantly elevated at heart failure clinic yesterday up to 2800 compared to prior admission at which time it was 1100.  He was given a one-time dose of 80 mg IV Lasix.  He returned to the clinic today with no improvement in weight.  In addition it was noted that his creatinine had markedly increased.  Due to this, patient was recommended to go to the ED.  ED course: On arrival to the ED, patient's blood pressure was 121/91 with heart rate of 80.  He was saturating at 99% on home 2 L of oxygen.  Workup remarkable for creatinine of 3.33 with BUN of 73, BNP of 2500, troponin of 57, platelets of 91.  Chest x-ray was obtained that demonstrated cardiomegaly with pulmonary  venous congestion, and bilateral small pleural effusions.  TRH contacted for admission for acute on chronic heart failure.   Review of Systems: As mentioned in the history of present illness. All other systems reviewed and are negative. Past Medical History:  Diagnosis Date   CHF (congestive heart failure) (HCC)    EF 45% in 2021   Chronic kidney disease    COPD (chronic obstructive pulmonary disease) ([place]) 02/16/2020   Hyperlipidemia    Hypertension    Pneumonia 2021   Past Surgical History:  Procedure Laterality Date   CATARACT EXTRACTION W/PHACO Left 10/31/2021   Procedure: CATARACT EXTRACTION PHACO AND INTRAOCULAR LENS PLACEMENT ([place]) LEFT VISION BLUE 3.45 00:48.0;  Surgeon: Leandrew Koyanagi, MD;  Location: [place];  Service: Ophthalmology;  Laterality: Left;   CATARACT EXTRACTION W/PHACO Right 11/14/2021   Procedure: CATARACT EXTRACTION PHACO AND INTRAOCULAR LENS PLACEMENT ([facility]) RIGHT 8.59 01:10.5;  Surgeon: Leandrew Koyanagi, MD;  Location: [place];  Service: Ophthalmology;  Laterality: Right;   INGUINAL HERNIA REPAIR Right 02/04/2017   Procedure: HERNIA REPAIR INGUINAL ADULT;  Surgeon: Leonie Green, MD;  Location: [facility];  Service: General;  Laterality: Right;   NO PAST SURGERIES     QUADRICEPS TENDON REPAIR Left 02/15/2020   Procedure: REPAIR QUADRICEP TENDON;  Surgeon: Corky Mull, MD;  Location: [facility];  Service: Orthopedics;  Laterality: Left;   Social History:  reports that he quit smoking about 7 years ago. His smoking use included cigarettes. He has a 13.25 pack-year smoking history.  He has never used smokeless tobacco. He reports current alcohol use. He reports that he does not use drugs.  No Known Allergies  Family History  Problem Relation Age of Onset   Diabetes Mother    Diabetes Brother    Diabetes Maternal Grandmother    Diabetes Paternal Grandmother     Prior to Admission medications   Medication Sig Start Date End  Date Taking? Authorizing Provider  aspirin EC 81 MG tablet Take 1 tablet (81 mg total) by mouth daily. Swallow whole. 03/28/22  Yes Hackney, Otila Kluver A, FNP  calcitRIOL (ROCALTROL) 0.25 MCG capsule Take 0.25 mcg by mouth daily.   Yes [provider]  gabapentin (NEURONTIN) 100 MG capsule Take 1 capsule (100 mg total) by mouth 2 (two) times daily. 10/15/22  Yes [place] Eric J, DO  ipratropium-albuterol (DUONEB) 0.5-2.5 (3) MG/3ML SOLN Inhale 3 mLs into the lungs every 6 (six) hours as needed (wheezing).   Yes [provider]  metolazone (ZAROXOLYN) 2.5 MG tablet Take 1 tablet (2.5 mg total) by mouth 2 (two) times a week. 10/15/22  Yes [place] Eric J, DO  midodrine (PROAMATINE) 5 MG tablet Take 1 tablet (5 mg total) by mouth 3 (three) times daily with meals. 10/15/22  Yes [place] Eric J, DO  omeprazole (PRILOSEC) 20 MG capsule Take 20 mg by mouth daily. 11/04/22 11/04/23 Yes [provider]  torsemide (DEMADEX) 20 MG tablet Take 1 tablet (20 mg total) by mouth daily. 10/15/22  Yes [place] Eric J, DO  torsemide (DEMADEX) 100 MG tablet Take 1 tablet by mouth daily. Patient not taking: Reported on 11/20/2022 01/01/22   [provider]    Physical Exam: Vitals:   11/20/22 1300 11/20/22 1330 11/20/22 1400 11/20/22 1430  BP: 110/81 116/83 115/87 114/87  Pulse: 78 74 74 72  Resp: '20 18 17 12  '$ Temp:      SpO2: 98% 99% 93% (!) 87%  Weight:      Height:       Physical Exam Vitals and nursing note reviewed.  Constitutional:      General: He is not in acute distress.    Appearance: He is obese. He is not toxic-appearing.  HENT:     Head: Normocephalic and atraumatic.     Mouth/Throat:     Mouth: Mucous membranes are moist.     Pharynx: Oropharynx is clear.     Comments: Poor dentition Eyes:     Conjunctiva/sclera: Conjunctivae normal.     Pupils: Pupils are equal, round, and reactive to light.  Cardiovascular:     Rate and Rhythm: Normal rate and regular  rhythm.     Heart sounds: Murmur (2/6 systolic murmur heard best overlying the apex, possible murmur auscultated as well.) heard.     No gallop.     Comments: Bilateral pitting edema extending up to the abdomen and sacrum Pulmonary:     Effort: Pulmonary effort is normal. No tachypnea.     Breath sounds: Rales (Bilateral extending to the mid lung fields) present. No decreased breath sounds, wheezing or rhonchi.  Abdominal:     General: Bowel sounds are normal. There is distension.     Palpations: Abdomen is soft.     Tenderness: There is no abdominal tenderness.     Hernia: No hernia is present.     Comments: Tympanic  Musculoskeletal:     Cervical back: Neck supple.     Right lower leg: 2+ Pitting Edema present.     Left  lower leg: 2+ Pitting Edema present.  Skin:    General: Skin is warm and dry.  Neurological:     General: No focal deficit present.     Mental Status: He is alert and oriented to person, place, and time.  Psychiatric:        Mood and Affect: Mood normal.        Behavior: Behavior normal.    Data Reviewed: CBC with no leukocytosis or anemia.  Platelets low at 91 similar to prior CBC. BMP with chloride of 95, bicarb 31, glucose 108, BUN 73, creatinine 3.33, GFR of 18 with anion gap of 15.  BNP elevated at 2500.  BNP obtained yesterday elevated 2800.  Initial troponin elevated at 57.  EKG personally reviewed.  Sinus rhythm with rate of 80.  Right axis deviation.  No acute ST or T wave changes concerning for acute ischemia.  DG Chest 2 View  Result Date: 11/20/2022 CLINICAL DATA:  Shortness of breath.  Heart failure. EXAM: CHEST - 2 VIEW COMPARISON:  10/02/2022 FINDINGS: Lateral view degraded by patient arm position. Midline trachea. Mild cardiomegaly. Atherosclerosis in the transverse aorta. Tiny bilateral pleural effusions. No pneumothorax. Interstitial prominence and indistinctness is increased. Mild bibasilar atelectasis. IMPRESSION: Cardiomegaly with mild  pulmonary venous congestion superimposed upon COPD/chronic bronchitis. Tiny bilateral pleural effusions. Aortic Atherosclerosis (ICD10-I70.0). Electronically Signed   By: Abigail Miyamoto M.D.   On: 11/20/2022 12:08    Results are pending, will review when available.  Assessment and Plan: * Acute on chronic heart failure [facility]) Patient presenting with gradual onset worsening lower extremity edema that has quickly worsened over the last 48 hours and has been unresponsive to IV Lasix push in the outpatient setting.  Per chart review, patient has a history of HFpEF with preserved left EF, but significant right ventricular dysfunction with elevated PASP, concerning for pulmonary hypertension.  No prior right or left heart cath for further evaluation.  - Cardiology consulted; appreciate their recommendations - Lasix 160 mg IV push followed by Lasix infusion - Will follow-up with cardiology regarding restarting metolazone - Daily weights - Strict in and out - No GDMT at this time due to hypotension requiring midodrine - No indication at this time to repeat echo - Will follow-up with cardiology if patient would benefit from right heart cath to evaluate pulmonary hypertension  Acute kidney injury superimposed on chronic kidney disease ([place]) Patient presenting with elevated creatinine up to 3.3, previously 1.67.  Concerning for acute on chronic cardiorenal syndrome.  Will aggressively diurese and monitor closely.  - Aggressive diuresis as noted above - Monitor urine output closely.  In and out as needed - Daily BMP - Avoid nephrotoxic agents as able - If further worsening of renal function or decreased urine output, will consult nephrology  Chronic hypoxic respiratory failure ([place]) - Continue home supplemental oxygen to maintain oxygen saturation above 88%  Thrombocytopenia (HCC) Patient has a history of chronic thrombocytopenia with abdominal distention on examination today.  Given history of  cardiorenal with right-sided heart failure, this is concerning for possible cirrhosis.  - Abdominal ultrasound - PT/INR  COPD (chronic obstructive pulmonary disease) (HCC) No evidence of acute exacerbation at this time.  No wheezing on examination.  - Continue home DuoNebs  Advance Care Planning:   Code Status: Full Code   Consults: Cardiology  Family Communication: Patient's friend updated at bedside  Severity of Illness: The appropriate patient status for this patient is OBSERVATION. Observation status is judged to be reasonable  and necessary in order to provide the required intensity of service to ensure the patient's safety. The patient's presenting symptoms, physical exam findings, and initial radiographic and laboratory data in the context of their medical condition is felt to place them at decreased risk for further clinical deterioration. Furthermore, it is anticipated that the patient will be medically stable for discharge from the hospital within 2 midnights of admission.   Author: Jose Persia, MD 11/20/2022 3:19 PM  For on call review [email or web address].

## 2022-11-20 NOTE — ED Notes (Signed)
Pt sleeping. First lasix infusion not complete yet.

## 2022-11-20 NOTE — ED Notes (Signed)
Pt has dinner tray and IV remote.

## 2022-11-21 DIAGNOSIS — J9622 Acute and chronic respiratory failure with hypercapnia: Secondary | ICD-10-CM

## 2022-11-21 DIAGNOSIS — R7301 Impaired fasting glucose: Secondary | ICD-10-CM | POA: Diagnosis present

## 2022-11-21 DIAGNOSIS — R601 Generalized edema: Secondary | ICD-10-CM | POA: Diagnosis not present

## 2022-11-21 DIAGNOSIS — D696 Thrombocytopenia, unspecified: Secondary | ICD-10-CM | POA: Diagnosis present

## 2022-11-21 DIAGNOSIS — I5033 Acute on chronic diastolic (congestive) heart failure: Secondary | ICD-10-CM | POA: Diagnosis present

## 2022-11-21 DIAGNOSIS — N5089 Other specified disorders of the male genital organs: Secondary | ICD-10-CM | POA: Diagnosis present

## 2022-11-21 DIAGNOSIS — Z5329 Procedure and treatment not carried out because of patient's decision for other reasons: Secondary | ICD-10-CM | POA: Diagnosis present

## 2022-11-21 DIAGNOSIS — I5082 Biventricular heart failure: Secondary | ICD-10-CM | POA: Diagnosis present

## 2022-11-21 DIAGNOSIS — J9621 Acute and chronic respiratory failure with hypoxia: Secondary | ICD-10-CM

## 2022-11-21 DIAGNOSIS — Z79899 Other long term (current) drug therapy: Secondary | ICD-10-CM | POA: Diagnosis not present

## 2022-11-21 DIAGNOSIS — J449 Chronic obstructive pulmonary disease, unspecified: Secondary | ICD-10-CM | POA: Diagnosis present

## 2022-11-21 DIAGNOSIS — N189 Chronic kidney disease, unspecified: Secondary | ICD-10-CM | POA: Diagnosis not present

## 2022-11-21 DIAGNOSIS — Z9842 Cataract extraction status, left eye: Secondary | ICD-10-CM | POA: Diagnosis not present

## 2022-11-21 DIAGNOSIS — E785 Hyperlipidemia, unspecified: Secondary | ICD-10-CM | POA: Diagnosis present

## 2022-11-21 DIAGNOSIS — N179 Acute kidney failure, unspecified: Secondary | ICD-10-CM | POA: Diagnosis not present

## 2022-11-21 DIAGNOSIS — I509 Heart failure, unspecified: Secondary | ICD-10-CM | POA: Diagnosis present

## 2022-11-21 DIAGNOSIS — Z6832 Body mass index (BMI) 32.0-32.9, adult: Secondary | ICD-10-CM | POA: Diagnosis not present

## 2022-11-21 DIAGNOSIS — Z833 Family history of diabetes mellitus: Secondary | ICD-10-CM | POA: Diagnosis not present

## 2022-11-21 DIAGNOSIS — N184 Chronic kidney disease, stage 4 (severe): Secondary | ICD-10-CM | POA: Diagnosis present

## 2022-11-21 DIAGNOSIS — I7090 Unspecified atherosclerosis: Secondary | ICD-10-CM | POA: Diagnosis present

## 2022-11-21 DIAGNOSIS — I5023 Acute on chronic systolic (congestive) heart failure: Secondary | ICD-10-CM | POA: Diagnosis not present

## 2022-11-21 DIAGNOSIS — E669 Obesity, unspecified: Secondary | ICD-10-CM

## 2022-11-21 DIAGNOSIS — E876 Hypokalemia: Secondary | ICD-10-CM | POA: Diagnosis present

## 2022-11-21 DIAGNOSIS — I493 Ventricular premature depolarization: Secondary | ICD-10-CM | POA: Diagnosis present

## 2022-11-21 DIAGNOSIS — N1832 Chronic kidney disease, stage 3b: Secondary | ICD-10-CM | POA: Diagnosis not present

## 2022-11-21 DIAGNOSIS — E8729 Other acidosis: Secondary | ICD-10-CM | POA: Diagnosis present

## 2022-11-21 DIAGNOSIS — Z87891 Personal history of nicotine dependence: Secondary | ICD-10-CM | POA: Diagnosis not present

## 2022-11-21 DIAGNOSIS — I2489 Other forms of acute ischemic heart disease: Secondary | ICD-10-CM | POA: Diagnosis present

## 2022-11-21 DIAGNOSIS — I130 Hypertensive heart and chronic kidney disease with heart failure and stage 1 through stage 4 chronic kidney disease, or unspecified chronic kidney disease: Secondary | ICD-10-CM | POA: Diagnosis present

## 2022-11-21 LAB — CBC WITH DIFFERENTIAL/PLATELET
Abs Immature Granulocytes: 0.02 10*3/uL (ref 0.00–0.07)
Basophils Absolute: 0 10*3/uL (ref 0.0–0.1)
Basophils Relative: 0 %
Eosinophils Absolute: 0.1 10*3/uL (ref 0.0–0.5)
Eosinophils Relative: 1 %
HCT: 54.5 % — ABNORMAL HIGH (ref 39.0–52.0)
Hemoglobin: 15.7 g/dL (ref 13.0–17.0)
Immature Granulocytes: 0 %
Lymphocytes Relative: 14 %
Lymphs Abs: 0.9 10*3/uL (ref 0.7–4.0)
MCH: 31.4 pg (ref 26.0–34.0)
MCHC: 28.8 g/dL — ABNORMAL LOW (ref 30.0–36.0)
MCV: 109 fL — ABNORMAL HIGH (ref 80.0–100.0)
Monocytes Absolute: 0.7 10*3/uL (ref 0.1–1.0)
Monocytes Relative: 11 %
Neutro Abs: 4.6 10*3/uL (ref 1.7–7.7)
Neutrophils Relative %: 74 %
Platelets: 94 10*3/uL — ABNORMAL LOW (ref 150–400)
RBC: 5 MIL/uL (ref 4.22–5.81)
RDW: 15.7 % — ABNORMAL HIGH (ref 11.5–15.5)
WBC: 6.3 10*3/uL (ref 4.0–10.5)
nRBC: 0 % (ref 0.0–0.2)

## 2022-11-21 LAB — BLOOD GAS, ARTERIAL
Acid-Base Excess: 13.2 mmol/L — ABNORMAL HIGH (ref 0.0–2.0)
Bicarbonate: 42.5 mmol/L — ABNORMAL HIGH (ref 20.0–28.0)
FIO2: 0.4 %
O2 Saturation: 89.8 %
Patient temperature: 37
pCO2 arterial: 77 mmHg (ref 32–48)
pH, Arterial: 7.35 (ref 7.35–7.45)
pO2, Arterial: 59 mmHg — ABNORMAL LOW (ref 83–108)

## 2022-11-21 LAB — COMPREHENSIVE METABOLIC PANEL
ALT: 14 U/L (ref 0–44)
AST: 21 U/L (ref 15–41)
Albumin: 3 g/dL — ABNORMAL LOW (ref 3.5–5.0)
Alkaline Phosphatase: 57 U/L (ref 38–126)
Anion gap: 7 (ref 5–15)
BUN: 61 mg/dL — ABNORMAL HIGH (ref 8–23)
CO2: 32 mmol/L (ref 22–32)
Calcium: 8.3 mg/dL — ABNORMAL LOW (ref 8.9–10.3)
Chloride: 101 mmol/L (ref 98–111)
Creatinine, Ser: 2.84 mg/dL — ABNORMAL HIGH (ref 0.61–1.24)
GFR, Estimated: 22 mL/min — ABNORMAL LOW (ref >60)
Glucose, Bld: 110 mg/dL — ABNORMAL HIGH (ref 70–99)
Potassium: 3.8 mmol/L (ref 3.5–5.1)
Sodium: 140 mmol/L (ref 135–145)
Total Bilirubin: 2.5 mg/dL — ABNORMAL HIGH (ref 0.3–1.2)
Total Protein: 5.9 g/dL — ABNORMAL LOW (ref 6.5–8.1)

## 2022-11-21 LAB — HEPATITIS C ANTIBODY: HCV Ab: NONREACTIVE

## 2022-11-21 LAB — GLUCOSE, CAPILLARY: Glucose-Capillary: 118 mg/dL — ABNORMAL HIGH (ref 70–99)

## 2022-11-21 LAB — MAGNESIUM: Magnesium: 2.3 mg/dL (ref 1.7–2.4)

## 2022-11-21 LAB — HEPATITIS B SURFACE ANTIGEN: Hepatitis B Surface Ag: NONREACTIVE

## 2022-11-21 LAB — HEPATITIS B CORE ANTIBODY, TOTAL: Hep B Core Total Ab: NONREACTIVE

## 2022-11-21 NOTE — ED Notes (Signed)
Informed RN bed assigned 

## 2022-11-21 NOTE — Assessment & Plan Note (Addendum)
Continue Lasix drip to remove fluid.  Legs are definitely less tight than they were when he came in but still quite a bit of fluid.

## 2022-11-21 NOTE — ED Notes (Signed)
Pt sleeping, no acute distress noted

## 2022-11-21 NOTE — Assessment & Plan Note (Addendum)
ABG showing a pCO2 of 77, pO2 of 59.  Patient will refuse BiPAP.  Try to keep oxygen as low as possible rather have more hypoxic at 86%.

## 2022-11-21 NOTE — ED Notes (Signed)
Pt's O2 saturation dropped to low 70s while using urinal independently. Pt declined to call out and stated "I'm fine, my oxygen's fine". Writer informed patient that his oxygen level dropped significantly and reinforced that he can call if he needs help with anything. No obvious distress noted. O2 saturation came back up to 90s once patient sitting back in bed again after 3-5 minutes.

## 2022-11-21 NOTE — Care Management Obs Status (Signed)
[facility] NOTIFICATION   Patient Details  Name: John [name] MRN: [id number] Date of Birth: 1946/07/07   Medicare Observation Status Notification Given:  Yes    Shelbie Hutching, RN 11/21/2022, 11:33 AM

## 2022-11-21 NOTE — Assessment & Plan Note (Addendum)
Most recent EF 50%.  Continue IV Lasix drip at 8 mg an hour.  Will give another dose of Zaroxolyn today.

## 2022-11-21 NOTE — Progress Notes (Signed)
[place], [place] 11/21/22  Subjective:   Hospital day # 0 Patient presented to the emergency room for shortness of breath and fluid retention.  Denies any changes in shortness of breath but has edema of her arms and legs.  States this has been going on for many months now. Nephrology consult has been requested to monitor renal function while patient is undergoing aggressive diuresis. Currently on furosemide infusion 8 mg/h states it is working and making him void.   Renal: 12/06 0701 - 12/07 0700 In: 66 [IV Piggyback:66] Out: 1150 [Urine:1150] Lab Results  Component Value Date   CREATININE 2.84 (H) 11/21/2022   CREATININE 3.33 (H) 11/20/2022   CREATININE 3.40 (H) 11/19/2022     Objective:  Vital signs in last 24 hours:  Temp:  [97.7 F (36.5 C)-98.5 F (36.9 C)] 98.3 F (36.8 C) (12/07 1131) Pulse Rate:  [70-81] 73 (12/07 1415) Resp:  [13-35] 19 (12/07 1415) BP: (100-144)/(72-101) 104/75 (12/07 1415) SpO2:  [75 %-98 %] 94 % (12/07 1415)  Weight change:  Filed Weights   11/20/22 1128  Weight: 103.9 kg    Intake/Output:    Intake/Output Summary (Last 24 hours) at 11/21/2022 1548 Last data filed at 11/21/2022 1156 Gross per 24 hour  Intake 564.4 ml  Output 1050 ml  Net -485.6 ml     Physical Exam: General: Sitting up in recliner chair, no acute distress  HEENT [place] O2, moist oral mucous membranes  Pulm/lungs Bilateral pulmonary crackles  CVS/Heart Irregular rhythm  Abdomen:  Soft, distended  Extremities: 2+ pitting edema on arms and legs  Neurologic: Alert, oriented  Skin: Warm          Basic Metabolic Panel:  Recent Labs  Lab 11/19/22 1333 11/20/22 1217 11/21/22 0744  NA 140 141 140  K 3.5 3.9 3.8  CL 96* 95* 101  CO2 35* 31 32  GLUCOSE 132* 108* 110*  BUN 70* 73* 61*  CREATININE 3.40* 3.33* 2.84*  CALCIUM 9.4 9.4 8.3*  MG  --  2.5* 2.3     CBC: Recent Labs  Lab 11/20/22 1217 11/21/22 0744  WBC 6.5 6.3   NEUTROABS  --  4.6  HGB 16.5 15.7  HCT 53.7* 54.5*  MCV 103.7* 109.0*  PLT 91* 94*      Lab Results  Component Value Date   HEPBSAG NON REACTIVE 11/21/2022      Microbiology:  No results found for this or any previous visit (from the past 240 hour(s)).  Coagulation Studies: Recent Labs    11/20/22 1724  LABPROT 15.6*  INR 1.3*    Urinalysis: No results for input(s): "COLORURINE", "LABSPEC", "PHURINE", "GLUCOSEU", "HGBUR", "BILIRUBINUR", "KETONESUR", "PROTEINUR", "UROBILINOGEN", "NITRITE", "LEUKOCYTESUR" in the last 72 hours.  Invalid input(s): "APPERANCEUR"    Imaging: US Abdomen Complete  Result Date: 11/20/2022 CLINICAL DATA:  Thrombocytopenia. Abdominal distension. EXAM: ABDOMEN ULTRASOUND COMPLETE COMPARISON:  CT scan 02/07/2022 FINDINGS: Gallbladder: Echogenic sludge and gallstones are noted the gallbladder. The largest calculus measures 6 mm. Associated pericholecystic fluid no sonographic Murphy sign. Common bile duct: Diameter: 5.3 mm Liver: The liver contour is irregular and could reflect changes of cirrhosis. No hepatic lesions or intrahepatic biliary dilatation. Portal vein is patent on color Doppler imaging with normal direction of blood flow towards the liver. IVC: Mildly dilated up to 3.5 cm. Pancreas: Visualized portion unremarkable. Spleen: Normal size. Right Kidney: Length: 10.1 cm. Diffuse increased echogenicity suggesting medical renal disease. Normal renal cortical thickness and no hydronephrosis. Left Kidney:  Length: 10.5 cm. Diffuse increased echogenicity suggesting medical renal disease. Normal renal cortical thickness and no hydronephrosis. Abdominal aorta: Normal caliber. Other findings: None. IMPRESSION: 1. Echogenic sludge and gallstones in the gallbladder. Gallbladder wall thickening likely due to cirrhosis and abdominal fluid. 2. Normal caliber common bile duct. 3. Slight irregularity of the liver contour could reflect changes of cirrhosis. No  hepatic lesions or intrahepatic biliary dilatation. 4. Normal spleen. 5. Increased echogenicity of both kidneys suggesting medical renal disease. Electronically Signed   By: Marijo Sanes M.D.   On: 11/20/2022 16:29   DG Chest 2 View  Result Date: 11/20/2022 CLINICAL DATA:  Shortness of breath.  Heart failure. EXAM: CHEST - 2 VIEW COMPARISON:  10/02/2022 FINDINGS: Lateral view degraded by patient arm position. Midline trachea. Mild cardiomegaly. Atherosclerosis in the transverse aorta. Tiny bilateral pleural effusions. No pneumothorax. Interstitial prominence and indistinctness is increased. Mild bibasilar atelectasis. IMPRESSION: Cardiomegaly with mild pulmonary venous congestion superimposed upon COPD/chronic bronchitis. Tiny bilateral pleural effusions. Aortic Atherosclerosis (ICD10-I70.0). Electronically Signed   By: Abigail Miyamoto M.D.   On: 11/20/2022 12:08     Medications:    sodium chloride     furosemide (LASIX) 200 mg in dextrose 5 % 100 mL (2 mg/mL) infusion 8 mg/hr (11/21/22 1018)    aspirin EC  81 mg Oral Daily   enoxaparin (LOVENOX) injection  40 mg Subcutaneous Q24H   midodrine  5 mg Oral TID WC   pantoprazole  40 mg Oral Daily   sodium chloride flush  3 mL Intravenous Q12H   sodium chloride, acetaminophen, ipratropium-albuterol, ondansetron (ZOFRAN) IV, sodium chloride flush  Assessment/ Plan:  76 y.o. male with COPD, hyperlipidemia, hypertension, congestive heart failure, chronic kidney disease   admitted on 11/20/2022 for Acute on chronic heart failure (HCC) [I50.9] CHF (congestive heart failure) (HCC) [I50.9]  #Acute kidney injury on chronic kidney disease stage IIIb Baseline creatinine 1.67/GFR 42 from 10/15/2022.  Underlying CKD secondary to atherosclerosis, hypertension Presenting creatinine of 3.40, which has improved to 2.84 today Will continue to monitor.  No acute indication for dialysis. Avoid hypotension.  #Acute on chronic heart failure with preserved EF,  grade 1 diastolic dysfunction. LVEF 50 to 55% from October 2023. Patient is continued on IV furosemide infusion with good response Monitor weight daily, preferably standing weight       LOS: 0 Unice Vantassel 12/7/20233:48 PM  [employer] [place]  Note: This note was prepared with Dragon dictation. Any transcription errors are unintentional

## 2022-11-21 NOTE — Evaluation (Signed)
Occupational Therapy Evaluation Patient Details Name: John [name] MRN: [id number] DOB: Apr 07, 1946 Today's Date: 11/21/2022   History of Present Illness 76 y/o male presented to ED on 11/20/22 for SOB and retaining fluids. Admitted for CHF exacerbation. PMH: diastolic CHF, CKD stage IV, HTN, HLD, COPD on 2L chronically, erythrocytosis, cor pulmonale   Clinical Impression   Patient presenting with decreased Ind in self care,balance, functional mobility/transfers, endurance, and safety awareness. Patient reports living at home with significant other and being mod I with use of SPC. He drives and completes all ADLs independently. Endorses significant other doing cleaning, laundry, and cooking. Patient currently functioning at supervision - min guard without use of AD while on 5Ls via [place]. Pt reports using O2 multiple times a day but he decides when to use it. He does not check O2 or feel SOB but just decides when to wear per his own guidelines. Patient will benefit from acute OT to increase overall independence in the areas of ADLs, functional mobility, and safety awareness  in order to safely discharge home.      Recommendations for follow up therapy are one component of a multi-disciplinary discharge planning process, led by the attending physician.  Recommendations may be updated based on patient status, additional functional criteria and insurance authorization.   Follow Up Recommendations  No OT follow up     Assistance Recommended at Discharge Set up Supervision/Assistance  Patient can return home with the following A little help with walking and/or transfers;Assistance with cooking/housework;Help with stairs or ramp for entrance;Assist for transportation    Functional Status Assessment  Patient has had a recent decline in their functional status and demonstrates the ability to make significant improvements in function in a reasonable and predictable amount of time.  Equipment  Recommendations  Other (comment) (pulse ox)       Precautions / Restrictions Precautions Precautions: Fall Precaution Comments: watch O2 Restrictions Weight Bearing Restrictions: No      Mobility Bed Mobility               General bed mobility comments: in recliner on arrival    Transfers Overall transfer level: Needs assistance Equipment used: None Transfers: Sit to/from Stand Sit to Stand: Min guard                  Balance Overall balance assessment: Mild deficits observed, not formally tested                                         ADL either performed or assessed with clinical judgement   ADL Overall ADL's : Needs assistance/impaired     Grooming: Wash/dry hands;Wash/dry face;Set up;Supervision/safety;Standing       Lower Body Bathing: Min guard;Sit to/from stand           Toilet Transfer: Supervision/safety;Min guard;Ambulation Toilet Transfer Details (indicate cue type and reason): simulated                 Vision Patient Visual Report: No change from baseline              Pertinent Vitals/Pain Pain Assessment Pain Assessment: No/denies pain     Hand Dominance Right   Extremity/Trunk Assessment Upper Extremity Assessment Upper Extremity Assessment: Generalized weakness   Lower Extremity Assessment Lower Extremity Assessment: Generalized weakness   Cervical / Trunk Assessment Cervical / Trunk Assessment: Normal   Communication Communication Communication:  No difficulties   Cognition Arousal/Alertness: Awake/alert Behavior During Therapy: WFL for tasks assessed/performed Overall Cognitive Status: Within Functional Limits for tasks assessed                                                  Home Living Family/patient expects to be discharged to:: Private residence Living Arrangements: Spouse/significant other Available Help at Discharge: Friend(s);Available  PRN/intermittently Type of Home: House Home Access: Stairs to enter [employer] of Steps: 3   Home Layout: One level     Bathroom Shower/Tub: Teacher, early years/pre: Standard     Home Equipment: Cane - single Barista (2 wheels);Other (comment)   Additional Comments: oxygen concentrator      Prior Functioning/Environment Prior Level of Function : Independent/Modified Independent;Driving             Mobility Comments: uses cane for mobility; uses 2L O2 as needed per patient's own guidelines ADLs Comments: indep per pt report        OT Problem List: Decreased strength;Decreased activity tolerance;Impaired balance (sitting and/or standing);Decreased safety awareness;Cardiopulmonary status limiting activity      OT Treatment/Interventions: Self-care/ADL training;Therapeutic exercise;Therapeutic activities;Balance training;Patient/family education;Energy conservation    OT Goals(Current goals can be found in the care plan section) Acute Rehab OT Goals Patient Stated Goal: to go home OT Goal Formulation: With patient Time For Goal Achievement: 12/05/22 Potential to Achieve Goals: Good ADL Goals Pt Will Perform Grooming: with modified independence;standing Pt Will Transfer to Toilet: with modified independence;ambulating Pt Will Perform Toileting - Clothing Manipulation and hygiene: with modified independence;sit to/from stand Pt Will Perform Tub/Shower Transfer: with modified independence;ambulating  OT Frequency: Min 2X/week       AM-PAC OT "6 Clicks" Daily Activity     Outcome Measure Help from another person eating meals?: None Help from another person taking care of personal grooming?: None Help from another person toileting, which includes using toliet, bedpan, or urinal?: A Little Help from another person bathing (including washing, rinsing, drying)?: A Little Help from another person to put on and taking off regular upper body  clothing?: None Help from another person to put on and taking off regular lower body clothing?: A Little 6 Click Score: 21   End of Session Equipment Utilized During Treatment: Oxygen Nurse Communication: Mobility status  Activity Tolerance: Patient tolerated treatment well Patient left: in chair;with call bell/phone within reach  OT Visit Diagnosis: Unsteadiness on feet (R26.81);Muscle weakness (generalized) (M62.81)                Time: 1751-0258 OT Time Calculation (min): 17 min Charges:  OT General Charges $OT Visit: 1 Visit OT Evaluation $OT Eval Low Complexity: 1 Low OT Treatments $Therapeutic Activity: 8-22 mins  Darleen Crocker, MS, OTR/L , CBIS ascom [phone]  11/21/22, 1:33 PM

## 2022-11-21 NOTE — Progress Notes (Addendum)
Mutual NOTE       Patient ID: John [name] MRN: [id number] DOB/AGE: August 21, 1946 76 y.o.  Admit date: 11/20/2022 Referring Physician Dr. Charleen Kirks Primary Physician Dr. Ginette Pitman Primary Cardiologist Dr. Nehemiah Massed (last seen in clinic 2021)  Reason for Consultation AoCHF  HPI: John [name] is a 42yoM with a PMH of HFpEF (50-55%, G1 DD with a D-shaped LV c/w RV overload, mi-mod TR 10/02/2022), COPD with as needed 2 L, CKD 4, chronic thrombocytopenia, HTN, hx tobacco use who presented to [facility] ED on 11/20/2022 shortness of breath and worsening peripheral edema. Cardiology was consulted for further assistance with his heart failure.  Interval History:  -renal function improving with diuresis, started on lasix gtt. Net neg 1L  -feels "the same." Denies chest pain, palpitations, shortness of breath or real improvement in his peripheral edema or scrotal edema. No scrotal pain.  -was on 82% on 5L upon my entry to the room. Tried to straighten out pulse ox wire, but this did not help. Turned him up to 6L with improvement to 88%.   Review of systems complete and found to be negative unless listed above     Past Medical History:  Diagnosis Date   CHF (congestive heart failure) (HCC)    EF 45% in 2021   Chronic kidney disease    COPD (chronic obstructive pulmonary disease) ([place]) 02/16/2020   Hyperlipidemia    Hypertension    Pneumonia 2021    Past Surgical History:  Procedure Laterality Date   CATARACT EXTRACTION W/PHACO Left 10/31/2021   Procedure: CATARACT EXTRACTION PHACO AND INTRAOCULAR LENS PLACEMENT ([place]) LEFT VISION BLUE 3.45 00:48.0;  Surgeon: Leandrew Koyanagi, MD;  Location: [facility];  Service: Ophthalmology;  Laterality: Left;   CATARACT EXTRACTION W/PHACO Right 11/14/2021   Procedure: CATARACT EXTRACTION PHACO AND INTRAOCULAR LENS PLACEMENT ([facility]) RIGHT 8.59 01:10.5;  Surgeon: Leandrew Koyanagi, MD;  Location: [facility];   Service: Ophthalmology;  Laterality: Right;   INGUINAL HERNIA REPAIR Right 02/04/2017   Procedure: HERNIA REPAIR INGUINAL ADULT;  Surgeon: Leonie Green, MD;  Location: [facility];  Service: General;  Laterality: Right;   NO PAST SURGERIES     QUADRICEPS TENDON REPAIR Left 02/15/2020   Procedure: REPAIR QUADRICEP TENDON;  Surgeon: Corky Mull, MD;  Location: [facility];  Service: Orthopedics;  Laterality: Left;    (Not in a hospital admission)  Social History   Socioeconomic History   Marital status: Single    Spouse name: Not on file   Number of children: Not on file   Years of education: Not on file   Highest education level: Not on file  Occupational History   Not on file  Tobacco Use   Smoking status: Former    Packs/day: 0.25    Years: 53.00    Total pack years: 13.25    Types: Cigarettes    Quit date: 02/15/2015    Years since quitting: 7.7   Smokeless tobacco: Never  Substance and Sexual Activity   Alcohol use: Yes    Comment: occassional   Drug use: No   Sexual activity: Not on file  Other Topics Concern   Not on file  Social History Narrative   Not on file   Social Determinants of Health   Financial Resource Strain: Low Risk  (07/17/2022)   Overall Financial Resource Strain (CARDIA)    Difficulty of Paying Living Expenses: Not hard at all  Food Insecurity: No Food Insecurity (11/20/2022)   Hunger Vital Sign  Worried About Charity fundraiser in the Last Year: Never true    [place] in the Last Year: Never true  Transportation Needs: No Transportation Needs (11/20/2022)   PRAPARE - Hydrologist (Medical): No    Lack of Transportation (Non-Medical): No  Physical Activity: Sufficiently Active (07/17/2022)   Exercise Vital Sign    Days of Exercise per Week: 5 days    Minutes of Exercise per Session: 60 min  Stress: No Stress Concern Present (07/17/2022)   [place]    Feeling of Stress : Not at all  Social Connections: [place] (07/17/2022)   Social Connection and Isolation Panel [NHANES]    Frequency of Communication with Friends and Family: More than three times a week    Frequency of Social Gatherings with Friends and Family: More than three times a week    Attends Religious Services: 1 to 4 times per year    Active Member of Clubs or Organizations: Yes    Attends Archivist Meetings: More than 4 times per year    Marital Status: Living with partner  Intimate Partner Violence: Not At Risk (11/20/2022)   Humiliation, Afraid, Rape, and Kick questionnaire    Fear of Current or Ex-Partner: No    Emotionally Abused: No    Physically Abused: No    Sexually Abused: No    Family History  Problem Relation Age of Onset   Diabetes Mother    Diabetes Brother    Diabetes Maternal Grandmother    Diabetes Paternal Grandmother       Intake/Output Summary (Last 24 hours) at 11/21/2022 0949 Last data filed at 11/21/2022 0737 Gross per 24 hour  Intake 92.4 ml  Output 1150 ml  Net -1057.6 ml    Vitals:   11/21/22 0300 11/21/22 0330 11/21/22 0730 11/21/22 0744  BP: 110/83 100/80 109/79   Pulse: 73 73 73   Resp: (!) 25 (!) 35 19   Temp:    98.3 F (36.8 C)  TempSrc:    Oral  SpO2: 96% 93% 92%   Weight:      Height:        PHYSICAL EXAM General: Elderly black male, well nourished, in no acute distress. Sitting in reclienr. HEENT:  Normocephalic and atraumatic. Neck:  No JVD.  Lungs: Slight conversational dyspnea and oxygen by nasal cannula, speaks in short phrases.  Decreased breath sounds bilaterally with trace basilar crackles Heart: HRRR . Normal S1 and S2 without gallops or murmurs.  Abdomen: Nontender to palpation, distended appearing. GU: Softball sized scrotal swelling with pitting edema Msk: Normal strength and tone for age. Extremities: Warm and well perfused. No clubbing, cyanosis.  Generally edematous  bilateral lower extremities with 1+ pitting pedal edema and brawny hyperpigmentation to the shins.  Neuro: Alert and oriented X 3. Psych:  Answers questions appropriately.   Labs: Basic Metabolic Panel: Recent Labs    11/20/22 1217 11/21/22 0744  NA 141 140  K 3.9 3.8  CL 95* 101  CO2 31 32  GLUCOSE 108* 110*  BUN 73* 61*  CREATININE 3.33* 2.84*  CALCIUM 9.4 8.3*  MG 2.5* 2.3    Liver Function Tests: Recent Labs    11/21/22 0744  AST 21  ALT 14  ALKPHOS 57  BILITOT 2.5*  PROT 5.9*  ALBUMIN 3.0*   No results for input(s): "LIPASE", "AMYLASE" in the last 72 hours. CBC: Recent  Labs    11/20/22 1217 11/21/22 0744  WBC 6.5 6.3  NEUTROABS  --  4.6  HGB 16.5 15.7  HCT 53.7* 54.5*  MCV 103.7* 109.0*  PLT 91* 94*    Cardiac Enzymes: Recent Labs    11/20/22 1217 11/20/22 1622  TROPONINIHS 57* 60*    BNP: Recent Labs    11/19/22 1333 11/20/22 1217  BNP 2,861.1* 2,538.1*    D-Dimer: No results for input(s): "DDIMER" in the last 72 hours. Hemoglobin A1C: No results for input(s): "HGBA1C" in the last 72 hours. Fasting Lipid Panel: No results for input(s): "CHOL", "HDL", "LDLCALC", "TRIG", "CHOLHDL", "LDLDIRECT" in the last 72 hours. Thyroid Function Tests: No results for input(s): "TSH", "T4TOTAL", "T3FREE", "THYROIDAB" in the last 72 hours.  Invalid input(s): "FREET3" Anemia Panel: No results for input(s): "VITAMINB12", "FOLATE", "FERRITIN", "TIBC", "IRON", "RETICCTPCT" in the last 72 hours.   Radiology: US Abdomen Complete  Result Date: 11/20/2022 CLINICAL DATA:  Thrombocytopenia. Abdominal distension. EXAM: ABDOMEN ULTRASOUND COMPLETE COMPARISON:  CT scan 02/07/2022 FINDINGS: Gallbladder: Echogenic sludge and gallstones are noted the gallbladder. The largest calculus measures 6 mm. Associated pericholecystic fluid no sonographic Murphy sign. Common bile duct: Diameter: 5.3 mm Liver: The liver contour is irregular and could reflect changes of  cirrhosis. No hepatic lesions or intrahepatic biliary dilatation. Portal vein is patent on color Doppler imaging with normal direction of blood flow towards the liver. IVC: Mildly dilated up to 3.5 cm. Pancreas: Visualized portion unremarkable. Spleen: Normal size. Right Kidney: Length: 10.1 cm. Diffuse increased echogenicity suggesting medical renal disease. Normal renal cortical thickness and no hydronephrosis. Left Kidney: Length: 10.5 cm. Diffuse increased echogenicity suggesting medical renal disease. Normal renal cortical thickness and no hydronephrosis. Abdominal aorta: Normal caliber. Other findings: None. IMPRESSION: 1. Echogenic sludge and gallstones in the gallbladder. Gallbladder wall thickening likely due to cirrhosis and abdominal fluid. 2. Normal caliber common bile duct. 3. Slight irregularity of the liver contour could reflect changes of cirrhosis. No hepatic lesions or intrahepatic biliary dilatation. 4. Normal spleen. 5. Increased echogenicity of both kidneys suggesting medical renal disease. Electronically Signed   By: Marijo Sanes M.D.   On: 11/20/2022 16:29   DG Chest 2 View  Result Date: 11/20/2022 CLINICAL DATA:  Shortness of breath.  Heart failure. EXAM: CHEST - 2 VIEW COMPARISON:  10/02/2022 FINDINGS: Lateral view degraded by patient arm position. Midline trachea. Mild cardiomegaly. Atherosclerosis in the transverse aorta. Tiny bilateral pleural effusions. No pneumothorax. Interstitial prominence and indistinctness is increased. Mild bibasilar atelectasis. IMPRESSION: Cardiomegaly with mild pulmonary venous congestion superimposed upon COPD/chronic bronchitis. Tiny bilateral pleural effusions. Aortic Atherosclerosis (ICD10-I70.0). Electronically Signed   By: Abigail Miyamoto M.D.   On: 11/20/2022 12:08    ECHO 10/02/2022  1. Left ventricular ejection fraction, by estimation, is 50 to 55%. The  left ventricle has low normal function. The left ventricle has no regional  wall motion  abnormalities. There is mild left ventricular hypertrophy.  Left ventricular diastolic  parameters are consistent with Grade I diastolic dysfunction (impaired  relaxation). There is the interventricular septum is flattened in diastole  ('D' shaped left ventricle), consistent with right ventricular volume  overload.   2. Right ventricular systolic function is moderately reduced. The right  ventricular size is moderately enlarged.   3. Left atrial size was mildly dilated.   4. Right atrial size was moderately dilated.   5. The mitral valve is normal in structure. No evidence of mitral valve  regurgitation.   6. Tricuspid valve regurgitation  is mild to moderate.   7. The aortic valve is normal in structure. Aortic valve regurgitation is  not visualized.   TELEMETRY reviewed by me (LT) 11/21/2022 : Sinus rhythm, low voltage rate 70s-80s with occasional PVCs, PACs  EKG reviewed by me: NSR rate 80 low voltage, nonspecific IVCD  Data reviewed by me (LT) 11/21/2022: admission H&P, nursing notes, CBC and BMP vitals telemetry I/O  Principal Problem:   Acute on chronic heart failure (HCC) Active Problems:   COPD (chronic obstructive pulmonary disease) (HCC)   Thrombocytopenia (HCC)   Chronic hypoxic respiratory failure (HCC)   Acute kidney injury superimposed on chronic kidney disease ([place])    ASSESSMENT AND PLAN:  John [name] is a 66yoM with a PMH of HFpEF (50-55%, G1 DD with a D-shaped LV c/w RV overload, mi-mod TR 10/02/2022), COPD, CKD 4, chronic thrombocytopenia, HTN who presented to [facility] ED on 11/20/2022 shortness of breath and worsening peripheral edema. Cardiology was consulted for further assistance with his heart failure.  # acute on chronic HFpEF (50-55%, g1dd 09/2022) Presents with progressive weight gain and scrotal swelling primarily following discharge from his last 12 day hospitalization (discharge on 10/31) despite compliance with his oral diuretics and IV Lasix at the heart  failure clinic. Lowest weight he noted was 202lbs, on admission 12/6 is 228lbs. BNP on admission the highest it has been over the past year at 2500 and he appears clinically grossly volume overloaded.  Suspect poor p.o. fluid intake and dietary indiscretions playing a role in this exacerbation with deteriorating renal function also possibly contributory. -s/p IV lasix 60 x1, '160mg'$  lasix bolus, and is on a Lasix infusion at '4mg'$ /hr -increase lasix gtt to '8mg'$ /hr.  -will consider dosing PO metolazone tomorrow.  -Outpatient diuretics: torsemide 20 mg daily & metolazone 2.5 mg twice weekly -consider addition of BB, spironolactone if renal function and BP allow. Was recently taken off of losartan d/t hypotension  -ordered repeat limited echo -Discussed the importance of reading food labels and salt restriction -Strict I's/O, daily weights  # AKI on CKD 4  Worsening Cr since 10/31 and trend 1.67 -- 2.2 -- 3.3 on admission. Improvement to 2.84, GFR 22 today.  -Continue diuresis as above -nephrology consulted by primary team today   # acute on chronic respiratory failure # COPD Is not followed by pulmonology on an outpatient basis, has 2 L he seems to use intermittently at home. Issues with hypoxia vs poor waveform from pulse ox overnight into today. Currently on 6L [place] -ABG per primary team   # demand ischemia Troponin is minimally elevated at 57, 60 this most likely represents demand/supply mismatch and not ACS in the absence of EKG changes or chest pain and in the setting of AKI -continue aspirin '81mg'$  daily  This patient's plan of care was discussed and created with Dr. Nehemiah Massed and he is in agreement.  Signed: Tristan Schroeder , PA-C 11/21/2022, 9:49 AM [facility]

## 2022-11-21 NOTE — Discharge Instructions (Signed)

## 2022-11-21 NOTE — Assessment & Plan Note (Addendum)
BMI 32.27

## 2022-11-21 NOTE — TOC Initial Note (Signed)
Transition of Care [facility]) - Initial/Assessment Note    Patient Details  Name: John [name] MRN: [id number] Date of Birth: 1946-12-14  Transition of Care [facility]) CM/SW Contact:    Shelbie Hutching, RN Phone Number: 11/21/2022, 11:42 AM  Clinical Narrative:                 Patient placed under observation for acute on chronic heart failure.  RNCM met with patient at the bedside.  Patient is sitting up in chair, appears tired.  Patient is from home, he lives with a roommate, he is independent and drives.  He has a cane and walker at home.  Patient reports that he wears oxygen sometimes, he is not with a company for his oxygen but he has his own concentrator.  Patient is current with the Heart failure clinic, he is also open with [facility] for PT [facility].  [place] notified of admission and notified that patient would benefit from [facility] RN for heart failure at discharge.  Patient says that his friend can pick him up at discharge.    Expected Discharge Plan: [place] Barriers to Discharge: Continued Medical Work up   Patient Goals and CMS Choice Patient states their goals for this hospitalization and ongoing recovery are:: to feel better and get back home CMS Medicare.gov Compare Post Acute Care list provided to:: Patient Choice offered to / list presented to : Patient  Expected Discharge Plan and Services Expected Discharge Plan: [place]   Discharge Planning Services: CM Consult Post Acute Care Choice: Resumption of Svcs/PTA Provider, Home Health Living arrangements for the past 2 months: Single Family Home                 DME Arranged: N/A DME Agency: NA       HH Arranged: RN [place] Agency: [facility] Date [place]: 11/21/22 Time [place]: 54 Representative spoke with at [place]: [place]  Prior Living Arrangements/Services Living arrangements for the past 2 months: [place] with:: Roommate Patient language  and need for interpreter reviewed:: Yes Do you feel safe going back to the place where you live?: Yes      Need for Family Participation in Patient Care: Yes (Comment) Care giver support system in place?: Yes (comment) Current home services: DME (cane and walker, oxygen) Criminal Activity/Legal Involvement Pertinent to Current Situation/Hospitalization: No - Comment as needed  Activities of Daily Living Home Assistive Devices/Equipment: Oxygen ADL Screening (condition at time of admission) Patient's cognitive ability adequate to safely complete daily activities?: Yes Is the patient deaf or have difficulty hearing?: No Does the patient have difficulty seeing, even when wearing glasses/contacts?: No Does the patient have difficulty concentrating, remembering, or making decisions?: No Patient able to express need for assistance with ADLs?: Yes Does the patient have difficulty dressing or bathing?: No Independently performs ADLs?: Yes (appropriate for developmental age) Does the patient have difficulty walking or climbing stairs?: Yes Weakness of Legs: Both Weakness of Arms/Hands: None  Permission Sought/Granted         Permission granted to share info w AGENCY: [facility]        Emotional Assessment Appearance:: Appears stated age Attitude/Demeanor/Rapport: Engaged Affect (typically observed): Flat Orientation: : Oriented to Self, Oriented to Place, Oriented to  Time, Oriented to Situation Alcohol / Substance Use: Not Applicable Psych Involvement: No (comment)  Admission diagnosis:  Acute on chronic heart failure ([place]) [I50.9] Patient Active  Problem List   Diagnosis Date Noted   Acute on chronic heart failure ([place]) 11/20/2022   Acute kidney injury superimposed on chronic kidney disease ([place]) 11/20/2022   Cellulitis 10/07/2022   Acute on chronic diastolic CHF (congestive heart failure) ([place]) 10/02/2022   Myocardial injury 10/02/2022   Hyperkalemia 10/02/2022   Acute  respiratory failure with hypoxia ([place]) 01/28/2022   Chronic hypoxic respiratory failure ([place]) 01/28/2022   Cardiorenal syndrome, stage 1-4 or unspecified chronic kidney disease, with heart failure ([place]) 01/28/2022   Acute left systolic heart failure ([place]) 01/28/2022   Cor pulmonale, acute ([place]) 01/28/2022   Renal failure syndrome 01/28/2022   CKD (chronic kidney disease), stage IV ([place]) 01/28/2022   COPD exacerbation ([place]) 01/28/2022   Cardiogenic shock ([place]) 01/28/2022   Erythrocytosis 12/20/2021   Thrombocytopenia ([place]) 12/20/2021   Acute CHF (congestive heart failure) ([place]) 06/21/2020   Hypertension    COPD (chronic obstructive pulmonary disease) ([place]) 02/16/2020   Rupture of left quadriceps tendon 02/15/2020   Community acquired pneumonia 09/01/2019   Pneumonia 09/01/2019   PCP:  Tracie Harrier, MD Pharmacy:   [facility] [address]), [place] [address] [place]) [place] [address] Phone: [phone] Fax: [phone]     Social Determinants of Health (SDOH) Interventions    Readmission Risk Interventions     No data to display

## 2022-11-21 NOTE — Evaluation (Signed)
Physical Therapy Evaluation Patient Details Name: Keilan Nichol MRN: [id number] DOB: 09/09/1946 Today's Date: 11/21/2022  History of Present Illness  76 y/o male presented to ED on 11/20/22 for SOB and retaining fluids. Admitted for CHF exacerbation. PMH: diastolic CHF, CKD stage IV, HTN, HLD, COPD on 2L chronically, erythrocytosis, cor pulmonale  Clinical Impression  Patient admitted with the above. PTA, patient lives with significant other and utilizes [facility] for mobility. Reports using O2 as needed per patient guidelines. Difficult to obtain reliable pleth with finger probe, RN present and changes to ear probe. On 3L O2 during session, spO2 90% with short mobility. Overall, functioning at min guard level with no AD. Patient with decreased safety awareness and need for O2 to maintain spO2 at a safe level. Patient will benefit from skilled PT services during acute stay to address listed deficits. No PT follow up recommended at this time. Will follow acutely.      Recommendations for follow up therapy are one component of a multi-disciplinary discharge planning process, led by the attending physician.  Recommendations may be updated based on patient status, additional functional criteria and insurance authorization.  Follow Up Recommendations No PT follow up      Assistance Recommended at Discharge Intermittent Supervision/Assistance  Patient can return home with the following  A little help with walking and/or transfers;Assistance with cooking/housework;Help with stairs or ramp for entrance    Equipment Recommendations None recommended by PT  Recommendations for Other Services       Functional Status Assessment Patient has had a recent decline in their functional status and demonstrates the ability to make significant improvements in function in a reasonable and predictable amount of time.     Precautions / Restrictions Precautions Precautions: Fall Precaution Comments: watch  O2 Restrictions Weight Bearing Restrictions: No      Mobility  Bed Mobility               General bed mobility comments: in recliner on arrival    Transfers Overall transfer level: Needs assistance Equipment used: None Transfers: Sit to/from Stand Sit to Stand: Min guard           General transfer comment: min guard for safety. Increased time to complete    Ambulation/Gait Ambulation/Gait assistance: Min guard Gait Distance (Feet): 12 Feet Assistive device: None Gait Pattern/deviations: Step-through pattern, Decreased stride length Gait velocity: decreased     General Gait Details: min guard for safety. Mild unsteadiness but typically uses cane. SpO2 dropped to 90% on 3L O2 with ear probe (finger probe not functioning well)  Stairs            Wheelchair Mobility    Modified Rankin (Stroke Patients Only)       Balance Overall balance assessment: Mild deficits observed, not formally tested                                           Pertinent Vitals/Pain Pain Assessment Pain Assessment: No/denies pain    Home Living Family/patient expects to be discharged to:: Private residence Living Arrangements: Spouse/significant other   Type of Home: House Home Access: Stairs to enter   Technical brewer of Steps: 3   Home Layout: One level Home Equipment: Cane - single Barista (2 wheels)      Prior Function Prior Level of Function : Independent/Modified Independent;Driving  Mobility Comments: uses cane for mobility; uses 2L O2 as needed per patient's own guidelines       Hand Dominance        Extremity/Trunk Assessment   Upper Extremity Assessment Upper Extremity Assessment: Defer to OT evaluation    Lower Extremity Assessment Lower Extremity Assessment: Generalized weakness    Cervical / Trunk Assessment Cervical / Trunk Assessment: Normal  Communication   Communication: No  difficulties  Cognition Arousal/Alertness: Awake/alert Behavior During Therapy: WFL for tasks assessed/performed Overall Cognitive Status: Within Functional Limits for tasks assessed                                          General Comments      Exercises     Assessment/Plan    PT Assessment Patient needs continued PT services  PT Problem List Decreased strength;Decreased activity tolerance;Decreased mobility;Decreased balance;Cardiopulmonary status limiting activity;Decreased knowledge of precautions;Decreased safety awareness       PT Treatment Interventions DME instruction;Gait training;Functional mobility training;Stair training;Therapeutic activities;Balance training;Therapeutic exercise;Patient/family education    PT Goals (Current goals can be found in the Care Plan section)  Acute Rehab PT Goals Patient Stated Goal: to go home PT Goal Formulation: With patient Time For Goal Achievement: 12/05/22 Potential to Achieve Goals: Good    Frequency Min 2X/week     Co-evaluation               AM-PAC PT "6 Clicks" Mobility  Outcome Measure Help needed turning from your back to your side while in a flat bed without using bedrails?: A Little Help needed moving from lying on your back to sitting on the side of a flat bed without using bedrails?: A Little Help needed moving to and from a bed to a chair (including a wheelchair)?: A Little Help needed standing up from a chair using your arms (e.g., wheelchair or bedside chair)?: A Little Help needed to walk in hospital room?: A Little Help needed climbing 3-5 steps with a railing? : A Little 6 Click Score: 18    End of Session Equipment Utilized During Treatment: Oxygen Activity Tolerance: Patient tolerated treatment well Patient left: in chair;with call bell/phone within reach Nurse Communication: Mobility status PT Visit Diagnosis: Unsteadiness on feet (R26.81);Muscle weakness (generalized)  (M62.81)    Time: 4098-1191 PT Time Calculation (min) (ACUTE ONLY): 27 min   Charges:   PT Evaluation $PT Eval Moderate Complexity: 1 Mod PT Treatments $Therapeutic Activity: 8-22 mins        Selenne Coggin A. Gilford Rile PT, DPT [facility] - Acute Rehabilitation Services   Chloeanne Poteet A Mirka Barbone 11/21/2022, 10:35 AM

## 2022-11-21 NOTE — Consult Note (Addendum)
   Heart Failure Nurse Navigator Note  HFpEF 50 to 55%.  Mild LVH.  Right ventricular systolic function mildly reduced.  Mild left atrial enlargement.  Moderate right atrial enlargement.  Mild to moderate tricuspid regurgitation.  He presented to the emergency room with complaints of worsening lower extremity swelling.  Prior to the admission he had been seen in the heart failure clinic and given a one-time dose of 80 mg of IV Lasix.  The day of admission he had return to the clinic with no improvement in his weight.  It was recommended that the patient present to the emergency room.  Chest x-ray revealed cardiomegaly with pulmonary venous congestion and small bilateral pleural effusions.  BNP of 2532.  Comorbidities:  Chronic respiratory failure on 2 L nasal cannula COPD Hypertension Chronic kidney disease Hyperlipidemia  Medications:  Lasix infusion Aspirin 81 mg daily  Labs:  Sodium 140, potassium 3.8, chloride 101, CO2 32, BUN 61, creatinine 2.84, magnesium 2.3 Weight documented at 103.9 kg Blood pressure 116/83   Initial meeting with patient in the ED on this admission.  He was sitting up in the chair remained on O2 in no acute distress.  He states that he lives at home with a friend, eats [employer] every day.  Discussed other things that he eats, he states that his roommate makes fresh vegetables, lean meats.  He does not add salt at the table.  It has been a few weeks since he ate at a restaurant.  Went over fluid restriction, patient really committed to an amount but states that he does measure the fluids that he is taking in with the measuring cup.  Dates that usually takes 1/2 cup of water with taking his medications at home.  He feels like he has always been swollen but is worse at this time.  He is compliant with his medications.  Darylene Price has talked with the patient seeing Dr. Haroldine Laws with the advanced heart failure clinic.  Will continue to  follow.  Pricilla Riffle RN CHFN

## 2022-11-21 NOTE — Progress Notes (Signed)
  Progress Note   Patient: John [name] [id number]:[id number] DOB: 1946/05/22 DOA: 11/20/2022     0 DOS: the patient was seen and examined on 11/21/2022   Assessment and Plan: * Acute on chronic diastolic CHF (congestive heart failure) ([place]) Most recent EF 50%.  Continue IV Lasix drip.  Blood pressure little too low for other medications.  Anasarca Continue Lasix drip to remove fluid.  Acute on chronic respiratory failure with hypoxia and hypercapnia (HCC) ABG showing a pCO2 of 77, pO2 of 59.  Will order BiPAP at night.  Try to keep oxygen as low as possible rather have more hypoxic.  Acute kidney injury superimposed on chronic kidney disease ([place]) Acute kidney injury on CKD stage IIIb.  Creatinine 3.4 on presentation and down to 2.84 with diuresis.  Watch closely with diuresis.  Nephrology consultation.  Thrombocytopenia (HCC) Chronic thrombocytopenia.  Check hepatitis C and hepatitis B core antibody.  Hepatitis B surface antigen negative.  Ultrasound right upper quadrant concerning for cirrhosis.  COPD (chronic obstructive pulmonary disease) ([place]) Continue home nebulizers.  Obesity (BMI 30-39.9) BMI 32.86        Subjective: Patient stating he is coming in with increased swelling of his lower extremities.  Currently on Lasix drip.  Patient states his breathing is okay but difficult to obtain pulse ox on his finger.  ABG showing CO2 retention and low pO2.  Physical Exam: Vitals:   11/21/22 1115 11/21/22 1131 11/21/22 1200 11/21/22 1230  BP:  116/83    Pulse: 72 72 74 75  Resp: (!) 23 (!) '26 16 20  '$ Temp:  98.3 F (36.8 C)    TempSrc:  Oral    SpO2: 95% 90% 92% 95%  Weight:      Height:       Physical Exam HENT:     Head: Normocephalic.     Mouth/Throat:     Pharynx: No oropharyngeal exudate.  Eyes:     General: Lids are normal.     Conjunctiva/sclera: Conjunctivae normal.  Cardiovascular:     Rate and Rhythm: Normal rate and regular rhythm.     Heart sounds: Normal  heart sounds, S1 normal and S2 normal.  Pulmonary:     Breath sounds: Examination of the right-lower field reveals decreased breath sounds and rhonchi. Examination of the left-lower field reveals decreased breath sounds and rhonchi. Decreased breath sounds and rhonchi present. No wheezing or rales.  Abdominal:     Palpations: Abdomen is soft.     Tenderness: There is no abdominal tenderness.  Musculoskeletal:     Right lower leg: Swelling present.     Left lower leg: Swelling present.  Skin:    General: Skin is warm.     Findings: No rash.  Neurological:     Mental Status: He is alert and oriented to person, place, and time.     Data Reviewed: ABG shows a pH of 7.35 pCO2 of 77, pO2 of 59, bicarb of 42.5 and that was on 40% oxygen. Creatinine 2.84, total bilirubin 2.5, BNP 2538, troponin borderline at 16, hematocrit elevated at 54.5, platelet count of 94.  INR 1.3   Disposition: Status is: Inpatient Remains inpatient appropriate because: Treating for anasarca and heart failure  Planned Discharge Destination: Home    Time spent: 28 minutes  Author: Loletha Grayer, MD 11/21/2022 12:55 PM  For on call review [email or web address].

## 2022-11-21 NOTE — ED Notes (Signed)
Pt on 6L [place] at this time and tolerating well

## 2022-11-22 DIAGNOSIS — R601 Generalized edema: Secondary | ICD-10-CM | POA: Diagnosis not present

## 2022-11-22 DIAGNOSIS — N179 Acute kidney failure, unspecified: Secondary | ICD-10-CM | POA: Diagnosis not present

## 2022-11-22 DIAGNOSIS — J9621 Acute and chronic respiratory failure with hypoxia: Secondary | ICD-10-CM | POA: Diagnosis not present

## 2022-11-22 DIAGNOSIS — I5033 Acute on chronic diastolic (congestive) heart failure: Secondary | ICD-10-CM | POA: Diagnosis not present

## 2022-11-22 LAB — COMPREHENSIVE METABOLIC PANEL
ALT: 15 U/L (ref 0–44)
AST: 23 U/L (ref 15–41)
Albumin: 3.4 g/dL — ABNORMAL LOW (ref 3.5–5.0)
Alkaline Phosphatase: 66 U/L (ref 38–126)
Anion gap: 7 (ref 5–15)
BUN: 62 mg/dL — ABNORMAL HIGH (ref 8–23)
CO2: 37 mmol/L — ABNORMAL HIGH (ref 22–32)
Calcium: 9.1 mg/dL (ref 8.9–10.3)
Chloride: 96 mmol/L — ABNORMAL LOW (ref 98–111)
Creatinine, Ser: 2.93 mg/dL — ABNORMAL HIGH (ref 0.61–1.24)
GFR, Estimated: 21 mL/min — ABNORMAL LOW (ref >60)
Glucose, Bld: 150 mg/dL — ABNORMAL HIGH (ref 70–99)
Potassium: 4 mmol/L (ref 3.5–5.1)
Sodium: 140 mmol/L (ref 135–145)
Total Bilirubin: 2.1 mg/dL — ABNORMAL HIGH (ref 0.3–1.2)
Total Protein: 7 g/dL (ref 6.5–8.1)

## 2022-11-22 LAB — HEPATITIS C ANTIBODY: HCV Ab: NONREACTIVE

## 2022-11-22 LAB — HEPATITIS B CORE ANTIBODY, TOTAL: Hep B Core Total Ab: NONREACTIVE

## 2022-11-22 LAB — HEPATITIS B SURFACE ANTIBODY, QUANTITATIVE: Hep B S AB Quant (Post): <3.1 m[IU]/mL — ABNORMAL LOW (ref >9.9)

## 2022-11-22 LAB — MAGNESIUM: Magnesium: 2.5 mg/dL — ABNORMAL HIGH (ref 1.7–2.4)

## 2022-11-22 MED ORDER — METOLAZONE 2.5 MG PO TABS
2.5000 mg | ORAL_TABLET | Freq: Once | ORAL | Status: AC
  Administered 2022-11-22: 2.5 mg via ORAL
  Filled 2022-11-22: qty 1

## 2022-11-22 MED ORDER — FOLIC ACID 1 MG PO TABS
1.0000 mg | ORAL_TABLET | Freq: Every day | ORAL | Status: DC
  Administered 2022-11-22 – 2022-11-27 (×6): 1 mg via ORAL
  Filled 2022-11-22 (×6): qty 1

## 2022-11-22 MED ORDER — ORAL CARE MOUTH RINSE: 15.0000 mL | OROMUCOSAL | Status: DC | PRN

## 2022-11-22 NOTE — Progress Notes (Signed)
PT Cancellation Note  Patient Details Name: John [name] MRN: [id number] DOB: 1945/12/30   Cancelled Treatment:     PT attempt. Pt is seated EOB upon arrival. He endorses feeling ok but requested to do his therapy this afternoon. Will return this PM as requested.    Willette Pa 11/22/2022, 8:58 AM

## 2022-11-22 NOTE — Hospital Course (Signed)
76 year old man with chronic diastolic congestive heart failure, chronic hypoxic respiratory failure on 2 L oxygen, COPD, hypertension, chronic kidney disease.  He presented to the hospital with increased lower extremity swelling and fatigue.  He was placed on Lasix drip.  The patient was hypoxic so an ABG was drawn and pCO2 was elevated at 77.  Patient declines using BiPAP.  12/9.  Patient declined lab draws.  Still on Lasix drip.  Case discussed with nephrology.  Hopefully will allow lab draws tomorrow.  12/10.  Patient states his swelling has gone down a little bit.  His weight has come down from 229 down to 216.49 pounds.  Nephrology would like to continue Lasix drip.  1211.  Weight up to 219 pounds.  Continue Lasix drip.  Creatinine improved down to 2.33.

## 2022-11-22 NOTE — Progress Notes (Signed)
Progress Note   Patient: John [name] GGY:[id number] DOB: 09-Feb-1946 DOA: 11/20/2022     1 DOS: the patient was seen and examined on 11/22/2022   Brief hospital course: 76 year old man with chronic diastolic congestive heart failure, chronic hypoxic respiratory failure on 2 L oxygen, COPD, hypertension, chronic kidney disease.  He presented to the hospital with increased lower extremity swelling and fatigue.  He was placed on Lasix drip.  The patient was hypoxic so an ABG was drawn and pCO2 was elevated at 77.  Patient declines using BiPAP.  Assessment and Plan: * Acute on chronic diastolic CHF (congestive heart failure) ([place]) Most recent EF 50%.  Continue IV Lasix drip at 8 mg an hour.  1 dose of metolazone given.  Blood pressure little too low for other medications.  Anasarca Continue Lasix drip to remove fluid.  Acute on chronic respiratory failure with hypoxia and hypercapnia (HCC) ABG showing a pCO2 of 77, pO2 of 59.  Patient will refuse BiPAP.  Try to keep oxygen as low as possible rather have more hypoxic at 86%.  Acute kidney injury superimposed on chronic kidney disease ([place]) Acute kidney injury on CKD stage IIIb.  Creatinine 3.4 on presentation and today 2.93.  Watch closely with diuresis.  Appreciate nephrology consultation.  Thrombocytopenia (HCC) Chronic thrombocytopenia.  Hepatitis C negative.  Hepatitis B negative.  Ultrasound right upper quadrant concerning for cirrhosis.  Will empirically start folic acid with MCV being high.  COPD (chronic obstructive pulmonary disease) ([place]) Continue home nebulizers.  Obesity (BMI 30-39.9) BMI 32.86        Subjective: Patient feels about the same with the swelling in his legs and breathing.  He states that he is always hypoxic and does just fine even without the oxygen.  He does not want to wear a BiPAP.  I explained the importance of blowing off carbon dioxide to help prevent hospitalizations and he is not going to wear the  BiPAP.  Physical Exam: Vitals:   11/22/22 0522 11/22/22 0734 11/22/22 0902 11/22/22 1250  BP: 117/81 120/86  117/85  Pulse: 72 74  68  Resp: 17 (!) 22  14  Temp: (!) 97.5 F (36.4 C) 97.6 F (36.4 C)  (!) 97.4 F (36.3 C)  TempSrc: Oral     SpO2: (!) 88% 100% 94% 91%  Weight:      Height:       Physical Exam HENT:     Head: Normocephalic.     Mouth/Throat:     Pharynx: No oropharyngeal exudate.  Eyes:     General: Lids are normal.     Conjunctiva/sclera: Conjunctivae normal.  Cardiovascular:     Rate and Rhythm: Normal rate and regular rhythm.     Heart sounds: Normal heart sounds, S1 normal and S2 normal.  Pulmonary:     Breath sounds: Examination of the right-lower field reveals decreased breath sounds and rhonchi. Examination of the left-lower field reveals decreased breath sounds and rhonchi. Decreased breath sounds and rhonchi present. No wheezing or rales.  Abdominal:     Palpations: Abdomen is soft.     Tenderness: There is no abdominal tenderness.  Musculoskeletal:     Right lower leg: Swelling present.     Left lower leg: Swelling present.  Skin:    General: Skin is warm.     Findings: No rash.  Neurological:     Mental Status: He is alert and oriented to person, place, and time.     Data Reviewed: Creatinine  2.93, MCV 109, B12 678  Family Communication: Refused  Disposition: Status is: Inpatient Remains inpatient appropriate because: Currently on Lasix drip  Planned Discharge Destination: Home    Time spent: 28 minutes Case discussed with nephrology  Author: Loletha Grayer, MD 11/22/2022 2:28 PM  For on call review [email or web address].

## 2022-11-22 NOTE — Progress Notes (Signed)
[place], [place] 11/22/22  Subjective:   Hospital day # 1 Patient presented to the emergency room for shortness of breath and fluid retention.    Patient seen sitting up in chair, alert and oriented Remains on supplemental oxygen Lower extremity edema remains   Renal: 12/07 0701 - 12/08 0700 In: 579.5 [P.O.:472; I.V.:107.5] Out: 1540 [Urine:1540] Lab Results  Component Value Date   CREATININE 2.93 (H) 11/22/2022   CREATININE 2.84 (H) 11/21/2022   CREATININE 3.33 (H) 11/20/2022     Objective:  Vital signs in last 24 hours:  Temp:  [97.4 F (36.3 C)-98.3 F (36.8 C)] 97.4 F (36.3 C) (12/08 1250) Pulse Rate:  [65-74] 68 (12/08 1250) Resp:  [14-22] 14 (12/08 1250) BP: (98-121)/(77-91) 117/85 (12/08 1250) SpO2:  [88 %-100 %] 91 % (12/08 1250) Weight:  [103.2 kg-103.6 kg] 103.2 kg (12/08 0500)  Weight change: -0.274 kg Filed Weights   11/20/22 1128 11/21/22 2129 11/22/22 0500  Weight: 103.9 kg 103.6 kg 103.2 kg    Intake/Output:    Intake/Output Summary (Last 24 hours) at 11/22/2022 1521 Last data filed at 11/22/2022 1509 Gross per 24 hour  Intake 476.53 ml  Output 1740 ml  Net -1263.47 ml      Physical Exam: General: Sitting up in recliner chair, no acute distress  HEENT [place] O2, moist oral mucous membranes  Pulm/lungs Bilateral pulmonary crackles  CVS/Heart Irregular rhythm  Abdomen:  Soft, distended  Extremities: 2+ pitting edema on arms and legs  Neurologic: Alert, oriented  Skin: Warm          Basic Metabolic Panel:  Recent Labs  Lab 11/19/22 1333 11/20/22 1217 11/21/22 0744 11/22/22 0446  NA 140 141 140 140  K 3.5 3.9 3.8 4.0  CL 96* 95* 101 96*  CO2 35* 31 32 37*  GLUCOSE 132* 108* 110* 150*  BUN 70* 73* 61* 62*  CREATININE 3.40* 3.33* 2.84* 2.93*  CALCIUM 9.4 9.4 8.3* 9.1  MG  --  2.5* 2.3 2.5*      CBC: Recent Labs  Lab 11/20/22 1217 11/21/22 0744  WBC 6.5 6.3  NEUTROABS  --  4.6  HGB 16.5  15.7  HCT 53.7* 54.5*  MCV 103.7* 109.0*  PLT 91* 94*       Lab Results  Component Value Date   HEPBSAG NON REACTIVE 11/21/2022      Microbiology:  No results found for this or any previous visit (from the past 240 hour(s)).  Coagulation Studies: Recent Labs    11/20/22 1724  LABPROT 15.6*  INR 1.3*     Urinalysis: No results for input(s): "COLORURINE", "LABSPEC", "PHURINE", "GLUCOSEU", "HGBUR", "BILIRUBINUR", "KETONESUR", "PROTEINUR", "UROBILINOGEN", "NITRITE", "LEUKOCYTESUR" in the last 72 hours.  Invalid input(s): "APPERANCEUR"    Imaging: US Abdomen Complete  Result Date: 11/20/2022 CLINICAL DATA:  Thrombocytopenia. Abdominal distension. EXAM: ABDOMEN ULTRASOUND COMPLETE COMPARISON:  CT scan 02/07/2022 FINDINGS: Gallbladder: Echogenic sludge and gallstones are noted the gallbladder. The largest calculus measures 6 mm. Associated pericholecystic fluid no sonographic Murphy sign. Common bile duct: Diameter: 5.3 mm Liver: The liver contour is irregular and could reflect changes of cirrhosis. No hepatic lesions or intrahepatic biliary dilatation. Portal vein is patent on color Doppler imaging with normal direction of blood flow towards the liver. IVC: Mildly dilated up to 3.5 cm. Pancreas: Visualized portion unremarkable. Spleen: Normal size. Right Kidney: Length: 10.1 cm. Diffuse increased echogenicity suggesting medical renal disease. Normal renal cortical thickness and no hydronephrosis. Left Kidney: Length: 10.5 cm.  Diffuse increased echogenicity suggesting medical renal disease. Normal renal cortical thickness and no hydronephrosis. Abdominal aorta: Normal caliber. Other findings: None. IMPRESSION: 1. Echogenic sludge and gallstones in the gallbladder. Gallbladder wall thickening likely due to cirrhosis and abdominal fluid. 2. Normal caliber common bile duct. 3. Slight irregularity of the liver contour could reflect changes of cirrhosis. No hepatic lesions or intrahepatic  biliary dilatation. 4. Normal spleen. 5. Increased echogenicity of both kidneys suggesting medical renal disease. Electronically Signed   By: Marijo Sanes M.D.   On: 11/20/2022 16:29     Medications:    sodium chloride     furosemide (LASIX) 200 mg in dextrose 5 % 100 mL (2 mg/mL) infusion 8 mg/hr (11/22/22 1509)    aspirin EC  81 mg Oral Daily   enoxaparin (LOVENOX) injection  40 mg Subcutaneous [id number]   folic acid  1 mg Oral Daily   midodrine  5 mg Oral TID WC   pantoprazole  40 mg Oral Daily   sodium chloride flush  3 mL Intravenous Q12H   sodium chloride, acetaminophen, ipratropium-albuterol, ondansetron (ZOFRAN) IV, mouth rinse, sodium chloride flush  Assessment/ Plan:  76 y.o. male with COPD, hyperlipidemia, hypertension, congestive heart failure, chronic kidney disease   admitted on 11/20/2022 for CHF (congestive heart failure) ([place]) [I50.9] Peripheral edema [R60.9] Acute on chronic heart failure (HCC) [I50.9]  #Acute kidney injury on chronic kidney disease stage IIIb Baseline creatinine 1.67/GFR 42 from 10/15/2022.  Underlying CKD secondary to atherosclerosis, hypertension  Creatinine slightly elevated today, urine output 1.5 L.    #Acute on chronic heart failure with preserved EF, grade 1 diastolic dysfunction. LVEF 50 to 55% from October 2023. Patient is continued on IV furosemide infusion with decent response. Will order one-time dose of metolazone 2.5 mg.  Continue furosemide drip at this time.   LOS: 1 John [name] 12/8/20233:21 PM  John [name], John [name]  Note: This note was prepared with Dragon dictation. Any transcription errors are unintentional

## 2022-11-22 NOTE — Progress Notes (Signed)
Patient admitted to the unit and a skin assessment was completed by myself and Wellsite geologist. The patient has some scattered bruising to the upper and lower extremities, dry heels, and a mobile lump on the right side of his upper back. No other skin issues noted at this time.

## 2022-11-23 DIAGNOSIS — J9621 Acute and chronic respiratory failure with hypoxia: Secondary | ICD-10-CM | POA: Diagnosis not present

## 2022-11-23 DIAGNOSIS — I5033 Acute on chronic diastolic (congestive) heart failure: Secondary | ICD-10-CM | POA: Diagnosis not present

## 2022-11-23 DIAGNOSIS — N179 Acute kidney failure, unspecified: Secondary | ICD-10-CM | POA: Diagnosis not present

## 2022-11-23 DIAGNOSIS — R601 Generalized edema: Secondary | ICD-10-CM | POA: Diagnosis not present

## 2022-11-23 NOTE — Progress Notes (Signed)
[facility] Encounter Note  Patient: John [name] / Admit Date: 11/20/2022 / Date of Encounter: 11/23/2022, 8:26 AM   Subjective: Patient continues to have significant amount of lower extremity scrotal edema and abdominal edema despite Lasix drip.  Urine output has been greater than 4-1/2 L which is appropriate despite glomerular filtration rate of 22.  Patient has had previous echocardiogram showing normal LV systolic function ejection fraction of 55%.  The patient does have significant couple issues for which will need further management  Review of Systems: Positive for: Shortness of breath edema Negative for: Vision change, hearing change, syncope, dizziness, nausea, vomiting,diarrhea, bloody stool, stomach pain, cough, congestion, diaphoresis, urinary frequency, urinary pain,skin lesions, skin rashes Others previously listed  Objective: Telemetry: Sinus rhythm Physical Exam: Blood pressure 122/84, pulse 79, temperature 98 F (36.7 C), temperature source Oral, resp. rate 16, height '5\' 10"'$  (1.778 m), weight 102 kg, SpO2 98 %. Body mass index is 32.27 kg/m. General: Well developed, well nourished, in no acute distress. Head: Normocephalic, atraumatic, sclera non-icteric, no xanthomas, nares are without discharge. Neck: No apparent masses Lungs: Normal respirations with few wheezes, no rhonchi, few rales , no crackles   Heart: Regular rate and rhythm, normal S1 S2, no murmur, no rub, no gallop, PMI is normal size and placement, carotid upstroke normal without bruit, jugular venous pressure normal Abdomen: Soft, non-tender,  distended with normoactive bowel sounds. No hepatosplenomegaly. Abdominal aorta is normal size without bruit Extremities 1+ edema, no clubbing, no cyanosis, no ulcers,  Peripheral: 2+ radial, 2+ femoral, 0+ dorsal pedal pulses Neuro: Alert and oriented. Moves all extremities spontaneously. Psych:  Responds to questions appropriately with a normal  affect.   Intake/Output Summary (Last 24 hours) at 11/23/2022 0826 Last data filed at 11/23/2022 0750 Gross per 24 hour  Intake 460.43 ml  Output 3350 ml  Net -2889.57 ml    Inpatient Medications:   aspirin EC  81 mg Oral Daily   enoxaparin (LOVENOX) injection  40 mg Subcutaneous [id number]   folic acid  1 mg Oral Daily   midodrine  5 mg Oral TID WC   pantoprazole  40 mg Oral Daily   sodium chloride flush  3 mL Intravenous Q12H   Infusions:   sodium chloride     furosemide (LASIX) 200 mg in dextrose 5 % 100 mL (2 mg/mL) infusion 8 mg/hr (11/23/22 0728)    Labs: Recent Labs    11/21/22 0744 11/22/22 0446  NA 140 140  K 3.8 4.0  CL 101 96*  CO2 32 37*  GLUCOSE 110* 150*  BUN 61* 62*  CREATININE 2.84* 2.93*  CALCIUM 8.3* 9.1  MG 2.3 2.5*   Recent Labs    11/21/22 0744 11/22/22 0446  AST 21 23  ALT 14 15  ALKPHOS 57 66  BILITOT 2.5* 2.1*  PROT 5.9* 7.0  ALBUMIN 3.0* 3.4*   Recent Labs    11/20/22 1217 11/21/22 0744  WBC 6.5 6.3  NEUTROABS  --  4.6  HGB 16.5 15.7  HCT 53.7* 54.5*  MCV 103.7* 109.0*  PLT 91* 94*   No results for input(s): "CKTOTAL", "CKMB", "TROPONINI" in the last 72 hours. Invalid input(s): "POCBNP" No results for input(s): "HGBA1C" in the last 72 hours.   Weights: Filed Weights   11/21/22 2129 11/22/22 0500 11/23/22 0334  Weight: 103.6 kg 103.2 kg 102 kg     Radiology/Studies:  US Abdomen Complete  Result Date: 11/20/2022 CLINICAL DATA:  Thrombocytopenia. Abdominal distension. EXAM: ABDOMEN ULTRASOUND  COMPLETE COMPARISON:  CT scan 02/07/2022 FINDINGS: Gallbladder: Echogenic sludge and gallstones are noted the gallbladder. The largest calculus measures 6 mm. Associated pericholecystic fluid no sonographic Murphy sign. Common bile duct: Diameter: 5.3 mm Liver: The liver contour is irregular and could reflect changes of cirrhosis. No hepatic lesions or intrahepatic biliary dilatation. Portal vein is patent on color Doppler imaging with  normal direction of blood flow towards the liver. IVC: Mildly dilated up to 3.5 cm. Pancreas: Visualized portion unremarkable. Spleen: Normal size. Right Kidney: Length: 10.1 cm. Diffuse increased echogenicity suggesting medical renal disease. Normal renal cortical thickness and no hydronephrosis. Left Kidney: Length: 10.5 cm. Diffuse increased echogenicity suggesting medical renal disease. Normal renal cortical thickness and no hydronephrosis. Abdominal aorta: Normal caliber. Other findings: None. IMPRESSION: 1. Echogenic sludge and gallstones in the gallbladder. Gallbladder wall thickening likely due to cirrhosis and abdominal fluid. 2. Normal caliber common bile duct. 3. Slight irregularity of the liver contour could reflect changes of cirrhosis. No hepatic lesions or intrahepatic biliary dilatation. 4. Normal spleen. 5. Increased echogenicity of both kidneys suggesting medical renal disease. Electronically Signed   By: Marijo Sanes M.D.   On: 11/20/2022 16:29   DG Chest 2 View  Result Date: 11/20/2022 CLINICAL DATA:  Shortness of breath.  Heart failure. EXAM: CHEST - 2 VIEW COMPARISON:  10/02/2022 FINDINGS: Lateral view degraded by patient arm position. Midline trachea. Mild cardiomegaly. Atherosclerosis in the transverse aorta. Tiny bilateral pleural effusions. No pneumothorax. Interstitial prominence and indistinctness is increased. Mild bibasilar atelectasis. IMPRESSION: Cardiomegaly with mild pulmonary venous congestion superimposed upon COPD/chronic bronchitis. Tiny bilateral pleural effusions. Aortic Atherosclerosis (ICD10-I70.0). Electronically Signed   By: Abigail Miyamoto M.D.   On: 11/20/2022 12:08     Assessment and Recommendation  76 y.o. male with acute on chronic diastolic dysfunction congestive heart failure multifactorial in nature including chronic kidney disease stage IV slowly improving with Lasix drip 1.  Continue Lasix drip which appears to be working fairly well with acute on chronic  diastolic dysfunction congestive heart failure with chronic kidney disease 2.  No further cardiac intervention or diagnostics necessary at this time 3.  Patient to have further BiPAP and pulmonary treatment which may improve his overall medical management as per above  Signed, Serafina Royals M.D. FACC

## 2022-11-23 NOTE — Progress Notes (Signed)
Progress Note   Patient: John [name] EHM:[id number] DOB: 10-23-1946 DOA: 11/20/2022     2 DOS: the patient was seen and examined on 11/23/2022   Brief hospital course: 76 year old man with chronic diastolic congestive heart failure, chronic hypoxic respiratory failure on 2 L oxygen, COPD, hypertension, chronic kidney disease.  He presented to the hospital with increased lower extremity swelling and fatigue.  He was placed on Lasix drip.  The patient was hypoxic so an ABG was drawn and pCO2 was elevated at 77.  Patient declines using BiPAP.  Assessment and Plan: * Acute on chronic diastolic CHF (congestive heart failure) ([place]) Most recent EF 50%.  Continue IV Lasix drip at 8 mg an hour.  1 dose of metolazone given.  Blood pressure little too low for other medications.  Anasarca Continue Lasix drip to remove fluid.  Acute on chronic respiratory failure with hypoxia and hypercapnia (HCC) ABG showing a pCO2 of 77, pO2 of 59.  Patient will refuse BiPAP.  Try to keep oxygen as low as possible rather have more hypoxic at 86%.  Acute kidney injury superimposed on chronic kidney disease ([place]) Acute kidney injury on CKD stage IIIb.  Creatinine 3.4 on presentation and today 2.93.  Watch closely with diuresis.  Appreciate nephrology consultation.  Thrombocytopenia (HCC) Chronic thrombocytopenia.  Hepatitis C negative.  Hepatitis B negative.  Ultrasound right upper quadrant concerning for cirrhosis.  Will empirically start folic acid with MCV being high.  COPD (chronic obstructive pulmonary disease) ([place]) Continue home nebulizers.  Obesity (BMI 30-39.9) BMI 32.86        Subjective: Patient refused blood draw today.  Explained the importance of getting blood draws on a daily basis while on Lasix drip to recheck electrolytes and kidney function.  Patient states that his arms are sore from all the blood draws.  Patient's breathing is better.  Physical Exam: Vitals:   11/23/22 0334  11/23/22 0419 11/23/22 0801 11/23/22 1223  BP:  129/76 122/84 108/78  Pulse:  72 79 76  Resp:  '16 16 16  '$ Temp:  98.2 F (36.8 C) 98 F (36.7 C) 97.8 F (36.6 C)  TempSrc:   Oral Oral  SpO2:  97% 98% 99%  Weight: 102 kg     Height:       Physical Exam HENT:     Head: Normocephalic.     Mouth/Throat:     Pharynx: No oropharyngeal exudate.  Eyes:     General: Lids are normal.     Conjunctiva/sclera: Conjunctivae normal.  Cardiovascular:     Rate and Rhythm: Normal rate and regular rhythm.     Heart sounds: Normal heart sounds, S1 normal and S2 normal.  Pulmonary:     Breath sounds: Examination of the right-lower field reveals decreased breath sounds. Examination of the left-lower field reveals decreased breath sounds. Decreased breath sounds present. No wheezing, rhonchi or rales.  Abdominal:     Palpations: Abdomen is soft.     Tenderness: There is no abdominal tenderness.  Musculoskeletal:     Right lower leg: Swelling present.     Left lower leg: Swelling present.  Skin:    General: Skin is warm.     Findings: No rash.  Neurological:     Mental Status: He is alert and oriented to person, place, and time.     Data Reviewed: Patient refused labs today.  Family Communication: Refused  Disposition: Status is: Inpatient Remains inpatient appropriate because: Nephrology would like to do Lasix drip at  least another day.  Hopefully the patient will allow lab draws.  Planned Discharge Destination: Home    Time spent: 28 minutes  Author: Loletha Grayer, MD 11/23/2022 2:03 PM  For on call review [email or web address].

## 2022-11-23 NOTE — Progress Notes (Signed)
[place], [place] 11/23/22  Subjective:   Hospital day # 2 Patient presented to the emergency room for shortness of breath and fluid retention.    Patient seen sitting up in chair, alert and oriented Remains on supplemental oxygen Good response to IV Lasix but still has tight lower extremity edema.   Renal: 12/08 0701 - 12/09 0700 In: 395.5 [P.O.:360; I.V.:35.5] Out: 2700 [Urine:2700] Lab Results  Component Value Date   CREATININE 2.93 (H) 11/22/2022   CREATININE 2.84 (H) 11/21/2022   CREATININE 3.33 (H) 11/20/2022     Objective:  Vital signs in last 24 hours:  Temp:  [97.8 F (36.6 C)-98.4 F (36.9 C)] 97.8 F (36.6 C) (12/09 1223) Pulse Rate:  [70-79] 76 (12/09 1223) Resp:  [14-18] 16 (12/09 1223) BP: (106-129)/(76-84) 108/78 (12/09 1223) SpO2:  [90 %-99 %] 99 % (12/09 1223) Weight:  [102 kg] 102 kg (12/09 0334)  Weight change: -1.6 kg Filed Weights   11/21/22 2129 11/22/22 0500 11/23/22 0334  Weight: 103.6 kg 103.2 kg 102 kg    Intake/Output:    Intake/Output Summary (Last 24 hours) at 11/23/2022 1347 Last data filed at 11/23/2022 0846 Gross per 24 hour  Intake 100.43 ml  Output 3425 ml  Net -3324.57 ml      Physical Exam: General: Sitting up , no acute distress  HEENT [place] O2, moist oral mucous membranes  Pulm/lungs Bilateral pulmonary crackles  CVS/Heart Irregular rhythm  Abdomen:  Soft, distended  Extremities: 2+ pitting edema on arms and legs  Neurologic: Alert, oriented  Skin: Warm          Basic Metabolic Panel:  Recent Labs  Lab 11/19/22 1333 11/20/22 1217 11/21/22 0744 11/22/22 0446  NA 140 141 140 140  K 3.5 3.9 3.8 4.0  CL 96* 95* 101 96*  CO2 35* 31 32 37*  GLUCOSE 132* 108* 110* 150*  BUN 70* 73* 61* 62*  CREATININE 3.40* 3.33* 2.84* 2.93*  CALCIUM 9.4 9.4 8.3* 9.1  MG  --  2.5* 2.3 2.5*      CBC: Recent Labs  Lab 11/20/22 1217 11/21/22 0744  WBC 6.5 6.3  NEUTROABS  --  4.6  HGB  16.5 15.7  HCT 53.7* 54.5*  MCV 103.7* 109.0*  PLT 91* 94*       Lab Results  Component Value Date   HEPBSAG NON REACTIVE 11/21/2022      Microbiology:  No results found for this or any previous visit (from the past 240 hour(s)).  Coagulation Studies: Recent Labs    11/20/22 1724  LABPROT 15.6*  INR 1.3*     Urinalysis: No results for input(s): "COLORURINE", "LABSPEC", "PHURINE", "GLUCOSEU", "HGBUR", "BILIRUBINUR", "KETONESUR", "PROTEINUR", "UROBILINOGEN", "NITRITE", "LEUKOCYTESUR" in the last 72 hours.  Invalid input(s): "APPERANCEUR"    Imaging: No results found.   Medications:    sodium chloride     furosemide (LASIX) 200 mg in dextrose 5 % 100 mL (2 mg/mL) infusion 8 mg/hr (11/23/22 0830)    aspirin EC  81 mg Oral Daily   enoxaparin (LOVENOX) injection  40 mg Subcutaneous Q59D   folic acid  1 mg Oral Daily   midodrine  5 mg Oral TID WC   pantoprazole  40 mg Oral Daily   sodium chloride flush  3 mL Intravenous Q12H   sodium chloride, acetaminophen, ipratropium-albuterol, ondansetron (ZOFRAN) IV, mouth rinse, sodium chloride flush  Assessment/ Plan:  76 y.o. male with COPD, hyperlipidemia, hypertension, congestive heart failure, chronic kidney disease  admitted on 11/20/2022 for CHF (congestive heart failure) (HCC) [I50.9] Peripheral edema [R60.9] Acute on chronic heart failure (HCC) [I50.9]  #Acute kidney injury on chronic kidney disease stage IIIb Baseline creatinine 1.67/GFR 42 from 10/15/2022.  Underlying CKD secondary to atherosclerosis, hypertension Creatinine of 3.4 at presentation, improved to 2.9 today. Continue to monitor closely.  Avoid hypotension.  Currently on midodrine.   #Acute on chronic heart failure with preserved EF, grade 1 diastolic dysfunction.  With volume overload. LVEF 50 to 55% from October 2023. Patient is continued on IV furosemide infusion with decent response. Continue furosemide drip at this time. Outpatient regimen  of torsemide and metolazone.    LOS: [place] 12/9/20231:47 PM  [place], [place]  Note: This note was prepared with Dragon dictation. Any transcription errors are unintentional

## 2022-11-23 NOTE — Progress Notes (Signed)
Mobility Specialist - Progress Note   11/23/22 0846  Mobility  Activity Stood at bedside;Dangled on edge of bed  Level of Assistance Standby assist, set-up cues, supervision of patient - no hands on  Assistive Device None  Distance Ambulated (ft) 0 ft  Activity Response Tolerated well  Mobility Referral Yes  $Mobility charge 1 Mobility   Author responds to bed alarm. Pt EOB attempting to get OOB to use urinal. Pt completes bed mobility and STS indep. Pt left EOB with needs in reach and NT in room.   Gretchen Short  Mobility Specialist  11/23/22 8:48 AM

## 2022-11-24 DIAGNOSIS — R7301 Impaired fasting glucose: Secondary | ICD-10-CM

## 2022-11-24 DIAGNOSIS — I5033 Acute on chronic diastolic (congestive) heart failure: Secondary | ICD-10-CM | POA: Diagnosis not present

## 2022-11-24 DIAGNOSIS — E876 Hypokalemia: Secondary | ICD-10-CM

## 2022-11-24 DIAGNOSIS — N179 Acute kidney failure, unspecified: Secondary | ICD-10-CM | POA: Diagnosis not present

## 2022-11-24 DIAGNOSIS — R601 Generalized edema: Secondary | ICD-10-CM | POA: Diagnosis not present

## 2022-11-24 DIAGNOSIS — J9621 Acute and chronic respiratory failure with hypoxia: Secondary | ICD-10-CM | POA: Diagnosis not present

## 2022-11-24 LAB — MAGNESIUM: Magnesium: 2.3 mg/dL (ref 1.7–2.4)

## 2022-11-24 LAB — COMPREHENSIVE METABOLIC PANEL
ALT: 9 U/L (ref 0–44)
AST: 23 U/L (ref 15–41)
Albumin: 3.1 g/dL — ABNORMAL LOW (ref 3.5–5.0)
Alkaline Phosphatase: 61 U/L (ref 38–126)
Anion gap: 7 (ref 5–15)
BUN: 49 mg/dL — ABNORMAL HIGH (ref 8–23)
CO2: 39 mmol/L — ABNORMAL HIGH (ref 22–32)
Calcium: 9.2 mg/dL (ref 8.9–10.3)
Chloride: 92 mmol/L — ABNORMAL LOW (ref 98–111)
Creatinine, Ser: 2.42 mg/dL — ABNORMAL HIGH (ref 0.61–1.24)
GFR, Estimated: 27 mL/min — ABNORMAL LOW (ref >60)
Glucose, Bld: 196 mg/dL — ABNORMAL HIGH (ref 70–99)
Potassium: 3.2 mmol/L — ABNORMAL LOW (ref 3.5–5.1)
Sodium: 138 mmol/L (ref 135–145)
Total Bilirubin: 3.5 mg/dL — ABNORMAL HIGH (ref 0.3–1.2)
Total Protein: 6.3 g/dL — ABNORMAL LOW (ref 6.5–8.1)

## 2022-11-24 MED ORDER — POTASSIUM CHLORIDE CRYS ER 20 MEQ PO TBCR
40.0000 meq | EXTENDED_RELEASE_TABLET | Freq: Once | ORAL | Status: AC
  Administered 2022-11-24: 40 meq via ORAL
  Filled 2022-11-24: qty 2

## 2022-11-24 MED ORDER — POTASSIUM CHLORIDE CRYS ER 20 MEQ PO TBCR
20.0000 meq | EXTENDED_RELEASE_TABLET | Freq: Two times a day (BID) | ORAL | Status: DC
  Administered 2022-11-24 – 2022-11-27 (×6): 20 meq via ORAL
  Filled 2022-11-24 (×6): qty 1

## 2022-11-24 NOTE — Assessment & Plan Note (Signed)
Replace potassium while on Lasix drip.

## 2022-11-24 NOTE — Progress Notes (Signed)
[facility] Encounter Note  Patient: John [name] / Admit Date: 11/20/2022 / Date of Encounter: 11/24/2022, 11:51 AM   Subjective: 12/9.  Patient continues to have significant amount of lower extremity scrotal edema and abdominal edema despite Lasix drip.  Urine output has been greater than 4-1/2 L which is appropriate despite glomerular filtration rate of 22.  Patient has had previous echocardiogram showing normal LV systolic function ejection fraction of 55%.  The patient does have significant couple issues for which will need further management  12/10.  Patient continuing to have good urine output and response to Lasix drip with 6.3 L of output since admission.  Patient still has lower extremity edema shortness of breath and scrotal edema with stable glomerular filtration rate at 27.  The patient has had no incidents of significant hypertension or hypotension exacerbating above. Review of Systems: Positive for: Shortness of breath edema Negative for: Vision change, hearing change, syncope, dizziness, nausea, vomiting,diarrhea, bloody stool, stomach pain, cough, congestion, diaphoresis, urinary frequency, urinary pain,skin lesions, skin rashes Others previously listed  Objective: Telemetry: Sinus rhythm Physical Exam: Blood pressure 107/84, pulse 82, temperature 97.9 F (36.6 C), temperature source Oral, resp. rate 16, height '5\' 10"'$  (1.778 m), weight 98.2 kg, SpO2 91 %. Body mass index is 31.06 kg/m. General: Well developed, well nourished, in no acute distress. Head: Normocephalic, atraumatic, sclera non-icteric, no xanthomas, nares are without discharge. Neck: No apparent masses Lungs: Normal respirations with few wheezes, no rhonchi, few rales , no crackles   Heart: Regular rate and rhythm, normal S1 S2, no murmur, no rub, no gallop, PMI is normal size and placement, carotid upstroke normal without bruit, jugular venous pressure normal Abdomen: Soft, non-tender,   distended with normoactive bowel sounds. No hepatosplenomegaly. Abdominal aorta is normal size without bruit Extremities 1+ edema, no clubbing, no cyanosis, no ulcers,  Peripheral: 2+ radial, 2+ femoral, 0+ dorsal pedal pulses Neuro: Alert and oriented. Moves all extremities spontaneously. Psych:  Responds to questions appropriately with a normal affect.   Intake/Output Summary (Last 24 hours) at 11/24/2022 1151 Last data filed at 11/24/2022 1035 Gross per 24 hour  Intake 265.99 ml  Output 1500 ml  Net -1234.01 ml     Inpatient Medications:   aspirin EC  81 mg Oral Daily   enoxaparin (LOVENOX) injection  40 mg Subcutaneous E23V   folic acid  1 mg Oral Daily   midodrine  5 mg Oral TID WC   pantoprazole  40 mg Oral Daily   potassium chloride  20 mEq Oral BID   potassium chloride  40 mEq Oral Once   sodium chloride flush  3 mL Intravenous Q12H   Infusions:   sodium chloride     furosemide (LASIX) 200 mg in dextrose 5 % 100 mL (2 mg/mL) infusion 8 mg/hr (11/24/22 1035)    Labs: Recent Labs    11/22/22 0446 11/24/22 1008  NA 140 138  K 4.0 3.2*  CL 96* 92*  CO2 37* 39*  GLUCOSE 150* 196*  BUN 62* 49*  CREATININE 2.93* 2.42*  CALCIUM 9.1 9.2  MG 2.5* 2.3    Recent Labs    11/22/22 0446 11/24/22 1008  AST 23 23  ALT 15 9  ALKPHOS 66 61  BILITOT 2.1* 3.5*  PROT 7.0 6.3*  ALBUMIN 3.4* 3.1*    No results for input(s): "WBC", "NEUTROABS", "HGB", "HCT", "MCV", "PLT" in the last 72 hours.  No results for input(s): "CKTOTAL", "CKMB", "TROPONINI" in the last 72  hours. Invalid input(s): "POCBNP" No results for input(s): "HGBA1C" in the last 72 hours.   Weights: Filed Weights   11/22/22 0500 11/23/22 0334 11/24/22 0543  Weight: 103.2 kg 102 kg 98.2 kg     Radiology/Studies:  US Abdomen Complete  Result Date: 11/20/2022 CLINICAL DATA:  Thrombocytopenia. Abdominal distension. EXAM: ABDOMEN ULTRASOUND COMPLETE COMPARISON:  CT scan 02/07/2022 FINDINGS:  Gallbladder: Echogenic sludge and gallstones are noted the gallbladder. The largest calculus measures 6 mm. Associated pericholecystic fluid no sonographic Murphy sign. Common bile duct: Diameter: 5.3 mm Liver: The liver contour is irregular and could reflect changes of cirrhosis. No hepatic lesions or intrahepatic biliary dilatation. Portal vein is patent on color Doppler imaging with normal direction of blood flow towards the liver. IVC: Mildly dilated up to 3.5 cm. Pancreas: Visualized portion unremarkable. Spleen: Normal size. Right Kidney: Length: 10.1 cm. Diffuse increased echogenicity suggesting medical renal disease. Normal renal cortical thickness and no hydronephrosis. Left Kidney: Length: 10.5 cm. Diffuse increased echogenicity suggesting medical renal disease. Normal renal cortical thickness and no hydronephrosis. Abdominal aorta: Normal caliber. Other findings: None. IMPRESSION: 1. Echogenic sludge and gallstones in the gallbladder. Gallbladder wall thickening likely due to cirrhosis and abdominal fluid. 2. Normal caliber common bile duct. 3. Slight irregularity of the liver contour could reflect changes of cirrhosis. No hepatic lesions or intrahepatic biliary dilatation. 4. Normal spleen. 5. Increased echogenicity of both kidneys suggesting medical renal disease. Electronically Signed   By: Marijo Sanes M.D.   On: 11/20/2022 16:29   DG Chest 2 View  Result Date: 11/20/2022 CLINICAL DATA:  Shortness of breath.  Heart failure. EXAM: CHEST - 2 VIEW COMPARISON:  10/02/2022 FINDINGS: Lateral view degraded by patient arm position. Midline trachea. Mild cardiomegaly. Atherosclerosis in the transverse aorta. Tiny bilateral pleural effusions. No pneumothorax. Interstitial prominence and indistinctness is increased. Mild bibasilar atelectasis. IMPRESSION: Cardiomegaly with mild pulmonary venous congestion superimposed upon COPD/chronic bronchitis. Tiny bilateral pleural effusions. Aortic Atherosclerosis  (ICD10-I70.0). Electronically Signed   By: Abigail Miyamoto M.D.   On: 11/20/2022 12:08     Assessment and Recommendation  76 y.o. male with acute on chronic diastolic dysfunction congestive heart failure multifactorial in nature including chronic kidney disease stage IV slowly improving with Lasix drip with significant urine output and no evidence of worsening hypotension or acute coronary syndrome 1.  Continue Lasix drip which appears to be working fairly well with acute on chronic diastolic dysfunction congestive heart failure with chronic kidney disease 2.  No further cardiac intervention or diagnostics necessary at this time 3.  Patient to have further BiPAP and pulmonary treatment which may improve his overall medical management as per above due to the fact that the patient appears to have some episodes of hypoxia and/or sleep apnea 4.  Continuation of current regimen until satisfactory diuresis as per nephrology and outpatient torsemide and metolazone combination as per neuro nephrology 5.  Call if further questions with Jacquiline Doe and Dr. Clayborn Bigness covering this week  Signed, Serafina Royals M.D. FACC

## 2022-11-24 NOTE — Progress Notes (Signed)
Progress Note   Patient: John [name] VOJ:[id number] DOB: 1946-02-13 DOA: 11/20/2022     3 DOS: the patient was seen and examined on 11/24/2022   Brief hospital course: 76 year old man with chronic diastolic congestive heart failure, chronic hypoxic respiratory failure on 2 L oxygen, COPD, hypertension, chronic kidney disease.  He presented to the hospital with increased lower extremity swelling and fatigue.  He was placed on Lasix drip.  The patient was hypoxic so an ABG was drawn and pCO2 was elevated at 77.  Patient declines using BiPAP.  12/9.  Patient declined lab draws.  Still on Lasix drip.  Case discussed with nephrology.  Hopefully will allow lab draws tomorrow.  12/10.  Patient states his swelling has gone down a little bit.  His weight has come down from 229 down to 216.49 pounds.  Nephrology would like to continue Lasix drip.  Assessment and Plan: * Acute on chronic diastolic CHF (congestive heart failure) ([facility]) Most recent EF 50%.  Continue IV Lasix drip at 8 mg an hour.  1 dose of metolazone given during course.  Blood pressure little too low for other medications.  Anasarca Continue Lasix drip to remove fluid.  Acute on chronic respiratory failure with hypoxia and hypercapnia (HCC) ABG showing a pCO2 of 77, pO2 of 59.  Patient will refuse BiPAP.  Try to keep oxygen as low as possible rather have more hypoxic at 86%.  Patient was on 3 L this morning.  Acute kidney injury superimposed on chronic kidney disease ([place]) Acute kidney injury on CKD stage IIIb.  Creatinine 3.4 on presentation and last creatinine 2.93.  Hopefully patient will allow blood draw tomorrow.  Appreciate nephrology consultation.  Thrombocytopenia (HCC) Chronic thrombocytopenia.  Hepatitis C negative.  Hepatitis B negative.  Ultrasound right upper quadrant concerning for cirrhosis.  Will empirically start folic acid with MCV being high.  COPD (chronic obstructive pulmonary disease) ([place]) Continue home  nebulizers.  Impaired fasting glucose Last hemoglobin A1c 6.3.  Patient not a diabetic at this point.  Obesity (BMI 30-39.9) BMI 31.06        Subjective: Patient states his breathing is never the problem.  Legs are still swollen.  He states that his weights have been in the 220s.  Physical Exam: Vitals:   11/24/22 0543 11/24/22 0748 11/24/22 0750 11/24/22 1206  BP:  107/84  107/77  Pulse:  83 82 78  Resp:  16  20  Temp:  97.9 F (36.6 C)  97.7 F (36.5 C)  TempSrc:  Oral  Oral  SpO2:  (!) 89% 91% 98%  Weight: 98.2 kg     Height:       Physical Exam HENT:     Head: Normocephalic.     Mouth/Throat:     Pharynx: No oropharyngeal exudate.  Eyes:     General: Lids are normal.     Conjunctiva/sclera: Conjunctivae normal.  Cardiovascular:     Rate and Rhythm: Normal rate and regular rhythm.     Heart sounds: Normal heart sounds, S1 normal and S2 normal.  Pulmonary:     Breath sounds: Examination of the right-lower field reveals decreased breath sounds. Examination of the left-lower field reveals decreased breath sounds. Decreased breath sounds present. No wheezing, rhonchi or rales.  Abdominal:     Palpations: Abdomen is soft.     Tenderness: There is no abdominal tenderness.  Musculoskeletal:     Right lower leg: Swelling present.     Left lower leg: Swelling present.  Skin:    General: Skin is warm.     Findings: No rash.  Neurological:     Mental Status: He is alert and oriented to person, place, and time.     Data Reviewed: Creatinine 2.42, potassium 3.2, glucose 196  Family Communication: Declined  Disposition: Status is: Inpatient Remains inpatient appropriate because: Nephrology would like to continue Lasix drip at this point  Planned Discharge Destination: Home    Time spent: 28 minutes  Author: Loletha Grayer, MD 11/24/2022 1:58 PM  For on call review [email or web address].

## 2022-11-24 NOTE — Progress Notes (Signed)
[place], [place] 11/24/22  Subjective:   Hospital day # 3 Patient presented to the emergency room for shortness of breath and fluid retention.    Patient lost IV access this a.m. Did have labs this a.m. and creatinine down to 2.4. Good urine output of 1.7 L. Appears to be responding well to Lasix drip.   Renal: 12/09 0701 - 12/10 0700 In: 571 [P.O.:480; I.V.:91] Out: 1725 [Urine:1725] Lab Results  Component Value Date   CREATININE 2.42 (H) 11/24/2022   CREATININE 2.93 (H) 11/22/2022   CREATININE 2.84 (H) 11/21/2022     Objective:  Vital signs in last 24 hours:  Temp:  [97.6 F (36.4 C)-97.9 F (36.6 C)] 97.9 F (36.6 C) (12/10 0748) Pulse Rate:  [76-88] 82 (12/10 0750) Resp:  [16] 16 (12/10 0748) BP: (107-157)/(77-99) 107/84 (12/10 0748) SpO2:  [89 %-99 %] 91 % (12/10 0750) Weight:  [98.2 kg] 98.2 kg (12/10 0543)  Weight change: -3.8 kg Filed Weights   11/22/22 0500 11/23/22 0334 11/24/22 0543  Weight: 103.2 kg 102 kg 98.2 kg    Intake/Output:    Intake/Output Summary (Last 24 hours) at 11/24/2022 1152 Last data filed at 11/24/2022 1035 Gross per 24 hour  Intake 265.99 ml  Output 1500 ml  Net -1234.01 ml      Physical Exam: General: Laying in bed  HEENT  O2, moist oral mucous membranes  Pulm/lungs Bilateral pulmonary crackles  CVS/Heart Irregular rhythm  Abdomen:  Soft, distended  Extremities: 2+ pitting edema on arms and legs  Neurologic: Alert, oriented  Skin: Warm          Basic Metabolic Panel:  Recent Labs  Lab 11/19/22 1333 11/20/22 1217 11/21/22 0744 11/22/22 0446 11/24/22 1008  NA 140 141 140 140 138  K 3.5 3.9 3.8 4.0 3.2*  CL 96* 95* 101 96* 92*  CO2 35* 31 32 37* 39*  GLUCOSE 132* 108* 110* 150* 196*  BUN 70* 73* 61* 62* 49*  CREATININE 3.40* 3.33* 2.84* 2.93* 2.42*  CALCIUM 9.4 9.4 8.3* 9.1 9.2  MG  --  2.5* 2.3 2.5* 2.3      CBC: Recent Labs  Lab 11/20/22 1217 11/21/22 0744   WBC 6.5 6.3  NEUTROABS  --  4.6  HGB 16.5 15.7  HCT 53.7* 54.5*  MCV 103.7* 109.0*  PLT 91* 94*       Lab Results  Component Value Date   HEPBSAG NON REACTIVE 11/21/2022      Microbiology:  No results found for this or any previous visit (from the past 240 hour(s)).  Coagulation Studies: No results for input(s): "LABPROT", "INR" in the last 72 hours.   Urinalysis: No results for input(s): "COLORURINE", "LABSPEC", "PHURINE", "GLUCOSEU", "HGBUR", "BILIRUBINUR", "KETONESUR", "PROTEINUR", "UROBILINOGEN", "NITRITE", "LEUKOCYTESUR" in the last 72 hours.  Invalid input(s): "APPERANCEUR"    Imaging: No results found.   Medications:    sodium chloride     furosemide (LASIX) 200 mg in dextrose 5 % 100 mL (2 mg/mL) infusion 8 mg/hr (11/24/22 1035)    aspirin EC  81 mg Oral Daily   enoxaparin (LOVENOX) injection  40 mg Subcutaneous L49Z   folic acid  1 mg Oral Daily   midodrine  5 mg Oral TID WC   pantoprazole  40 mg Oral Daily   potassium chloride  20 mEq Oral BID   potassium chloride  40 mEq Oral Once   sodium chloride flush  3 mL Intravenous Q12H   sodium chloride, acetaminophen,  ipratropium-albuterol, ondansetron (ZOFRAN) IV, mouth rinse, sodium chloride flush  Assessment/ Plan:  76 y.o. male with COPD, hyperlipidemia, hypertension, congestive heart failure, chronic kidney disease   admitted on 11/20/2022 for CHF (congestive heart failure) (HCC) [I50.9] Peripheral edema [R60.9] Acute on chronic heart failure (HCC) [I50.9]  #Acute kidney injury on chronic kidney disease stage IIIb Baseline creatinine 1.67/GFR 42 from 10/15/2022.  Underlying CKD secondary to atherosclerosis, hypertension Creatinine down to 2.4.  Good urine output 1.7 L.  Continue Lasix drip at this time.   #Acute on chronic heart failure with preserved EF, grade 1 diastolic dysfunction.  With volume overload. LVEF 50 to 55% from October 2023. Continue IV Lasix drip while monitoring renal  parameters closely.  Urine output was 1.7 L over the preceding 24 hours.    LOS: 3 Duayne Brideau 12/10/202311:52 AM  [place], [place]  Note: This note was prepared with Dragon dictation. Any transcription errors are unintentional

## 2022-11-24 NOTE — Assessment & Plan Note (Signed)
Last hemoglobin A1c 6.3.  Patient not a diabetic at this point.

## 2022-11-25 DIAGNOSIS — I5033 Acute on chronic diastolic (congestive) heart failure: Secondary | ICD-10-CM | POA: Diagnosis not present

## 2022-11-25 DIAGNOSIS — D696 Thrombocytopenia, unspecified: Secondary | ICD-10-CM | POA: Diagnosis not present

## 2022-11-25 DIAGNOSIS — J9621 Acute and chronic respiratory failure with hypoxia: Secondary | ICD-10-CM | POA: Diagnosis not present

## 2022-11-25 DIAGNOSIS — R601 Generalized edema: Secondary | ICD-10-CM | POA: Diagnosis not present

## 2022-11-25 LAB — COMPREHENSIVE METABOLIC PANEL
ALT: 12 U/L (ref 0–44)
AST: 17 U/L (ref 15–41)
Albumin: 3 g/dL — ABNORMAL LOW (ref 3.5–5.0)
Alkaline Phosphatase: 53 U/L (ref 38–126)
Anion gap: 8 (ref 5–15)
BUN: 44 mg/dL — ABNORMAL HIGH (ref 8–23)
CO2: 41 mmol/L — ABNORMAL HIGH (ref 22–32)
Calcium: 9 mg/dL (ref 8.9–10.3)
Chloride: 91 mmol/L — ABNORMAL LOW (ref 98–111)
Creatinine, Ser: 2.33 mg/dL — ABNORMAL HIGH (ref 0.61–1.24)
GFR, Estimated: 28 mL/min — ABNORMAL LOW (ref >60)
Glucose, Bld: 113 mg/dL — ABNORMAL HIGH (ref 70–99)
Potassium: 3.6 mmol/L (ref 3.5–5.1)
Sodium: 140 mmol/L (ref 135–145)
Total Bilirubin: 2.5 mg/dL — ABNORMAL HIGH (ref 0.3–1.2)
Total Protein: 6.1 g/dL — ABNORMAL LOW (ref 6.5–8.1)

## 2022-11-25 LAB — CBC
HCT: 52.1 % — ABNORMAL HIGH (ref 39.0–52.0)
Hemoglobin: 15.7 g/dL (ref 13.0–17.0)
MCH: 31.9 pg (ref 26.0–34.0)
MCHC: 30.1 g/dL (ref 30.0–36.0)
MCV: 105.9 fL — ABNORMAL HIGH (ref 80.0–100.0)
Platelets: 94 10*3/uL — ABNORMAL LOW (ref 150–400)
RBC: 4.92 MIL/uL (ref 4.22–5.81)
RDW: 15 % (ref 11.5–15.5)
WBC: 7 10*3/uL (ref 4.0–10.5)
nRBC: 0 % (ref 0.0–0.2)

## 2022-11-25 LAB — MAGNESIUM: Magnesium: 2.2 mg/dL (ref 1.7–2.4)

## 2022-11-25 MED ORDER — METOLAZONE 2.5 MG PO TABS
2.5000 mg | ORAL_TABLET | Freq: Once | ORAL | Status: AC
  Administered 2022-11-25: 2.5 mg via ORAL
  Filled 2022-11-25: qty 1

## 2022-11-25 NOTE — Progress Notes (Signed)
OT Cancellation Note  Patient Details Name: John [name] MRN: [id number] DOB: 06-24-1946   Cancelled Treatment:    Reason Eval/Treat Not Completed: Patient declined, no reason specified. Pt appears angry and unhappy, states he would like to move to sink and wash up; therapist sets up necessary toiletries, but pt then refuses to transfer, stating that he "still has his dignity," and that he will not perform grooming tasks when there is someone else in his room. Pt's SpO2 at 83% with pt sitting in recliner and on 3L O2. Therapist suggests increasing O2 to 4L, but pt declines, yells at therapist to leave room. RN notified.   Josiah Lobo 11/25/2022, 9:52 AM

## 2022-11-25 NOTE — Progress Notes (Signed)
Physical Therapy Treatment Patient Details Name: John [name] MRN: [id number] DOB: 01/21/1946 Today's Date: 11/25/2022   History of Present Illness 76 y/o male presented to ED on 11/20/22 for SOB and retaining fluids. Admitted for CHF exacerbation. PMH: diastolic CHF, CKD stage IV, HTN, HLD, COPD on 2L chronically, erythrocytosis, cor pulmonale    PT Comments    Patient irritable on arrival but with conversation and providing choices, patient agreeable to therapy session. On 3L O2 at rest, spO2 90% but once in standing, spO2 84%. Ambulated on 4L O2 with spO2 fluctuating 85-89% with cues for deep breathing through nose as patient is a mouth breather. Overall, min guard for mobility with use of IV pole. Continues to demonstrate decreased insight into medical situation and poor safety awareness regarding O2 needs. No PT follow up recommended at this time.     Recommendations for follow up therapy are one component of a multi-disciplinary discharge planning process, led by the attending physician.  Recommendations may be updated based on patient status, additional functional criteria and insurance authorization.  Follow Up Recommendations  No PT follow up     Assistance Recommended at Discharge Intermittent Supervision/Assistance  Patient can return home with the following A little help with walking and/or transfers;Assistance with cooking/housework;Help with stairs or ramp for entrance   Equipment Recommendations  None recommended by PT    Recommendations for Other Services       Precautions / Restrictions Precautions Precautions: Fall Precaution Comments: watch O2 Restrictions Weight Bearing Restrictions: No     Mobility  Bed Mobility               General bed mobility comments: in recliner on arrival    Transfers Overall transfer level: Needs assistance Equipment used: None Transfers: Sit to/from Stand Sit to Stand: Min guard           General transfer  comment: increased time and use of momentum to come into standing    Ambulation/Gait Ambulation/Gait assistance: Min guard Gait Distance (Feet): 70 Feet Assistive device: IV Pole Gait Pattern/deviations: Step-through pattern, Decreased stride length Gait velocity: decreased     General Gait Details: min guard for safety. Use of IV pole for stability. Adamant about only use of cane this session and not willing to try RW.   Stairs             Wheelchair Mobility    Modified Rankin (Stroke Patients Only)       Balance Overall balance assessment: Mild deficits observed, not formally tested                                          Cognition Arousal/Alertness: Awake/alert Behavior During Therapy: WFL for tasks assessed/performed Overall Cognitive Status: Within Functional Limits for tasks assessed                                 General Comments: poor insight into medical situation and safety awareness'        Exercises      General Comments General comments (skin integrity, edema, etc.): On 3L O2 [place] on arrival, spO2 90% at rest. Upon standing, spO2 dropped to 84%. Increased to 4L O2 for ambulation. After 20', spO2 at 85%. Cues for deep breath in through nose as patient is a mouth breather. After 15' more,  spO2 89%. Returned to room and seated in recliner, spO2 85% and cues for deep breathing. Increased to 90% and left on 3L O2      Pertinent Vitals/Pain Pain Assessment Pain Assessment: No/denies pain    Home Living                          Prior Function            PT Goals (current goals can now be found in the care plan section) Acute Rehab PT Goals Patient Stated Goal: to go home PT Goal Formulation: With patient Time For Goal Achievement: 12/05/22 Potential to Achieve Goals: Good Progress towards PT goals: Progressing toward goals    Frequency    Min 2X/week      PT Plan Current plan remains  appropriate    Co-evaluation              AM-PAC PT "6 Clicks" Mobility   Outcome Measure  Help needed turning from your back to your side while in a flat bed without using bedrails?: A Little Help needed moving from lying on your back to sitting on the side of a flat bed without using bedrails?: A Little Help needed moving to and from a bed to a chair (including a wheelchair)?: A Little Help needed standing up from a chair using your arms (e.g., wheelchair or bedside chair)?: A Little Help needed to walk in hospital room?: A Little Help needed climbing 3-5 steps with a railing? : A Little 6 Click Score: 18    End of Session Equipment Utilized During Treatment: Oxygen Activity Tolerance: Patient tolerated treatment well Patient left: in chair;with call bell/phone within reach;with chair alarm set Nurse Communication: Mobility status PT Visit Diagnosis: Unsteadiness on feet (R26.81);Muscle weakness (generalized) (M62.81)     Time: 0383-3383 PT Time Calculation (min) (ACUTE ONLY): 26 min  Charges:  $Therapeutic Activity: 23-37 mins                     John [name] PT, DPT  [facility]    John [name] 11/25/2022, 12:26 PM

## 2022-11-25 NOTE — Progress Notes (Signed)
Progress Note   Patient: John [name] QMV:[id number] DOB: 30-May-1946 DOA: 11/20/2022     4 DOS: the patient was seen and examined on 11/25/2022   Brief hospital course: 76 year old man with chronic diastolic congestive heart failure, chronic hypoxic respiratory failure on 2 L oxygen, COPD, hypertension, chronic kidney disease.  He presented to the hospital with increased lower extremity swelling and fatigue.  He was placed on Lasix drip.  The patient was hypoxic so an ABG was drawn and pCO2 was elevated at 77.  Patient declines using BiPAP.  12/9.  Patient declined lab draws.  Still on Lasix drip.  Case discussed with nephrology.  Hopefully will allow lab draws tomorrow.  12/10.  Patient states his swelling has gone down a little bit.  His weight has come down from 229 down to 216.49 pounds.  Nephrology would like to continue Lasix drip.  1211.  Weight up to 219 pounds.  Continue Lasix drip.  Creatinine improved down to 2.33.  Assessment and Plan: * Acute on chronic diastolic CHF (congestive heart failure) ([place]) Most recent EF 50%.  Continue IV Lasix drip at 8 mg an hour.  Will give another dose of Zaroxolyn today.   Anasarca Continue Lasix drip to remove fluid.  Acute on chronic respiratory failure with hypoxia and hypercapnia (HCC) ABG showing a pCO2 of 77, pO2 of 59.  Patient will refuse BiPAP.  Try to keep oxygen as low as possible rather have more hypoxic at 86%.  Patient was on 3 L this morning.  Acute kidney injury superimposed on chronic kidney disease ([place]) Acute kidney injury on CKD stage IIIb.  Creatinine 3.4 on presentation and last creatinine 2.33.  Appreciate nephrology consultation.  Thrombocytopenia (HCC) Chronic thrombocytopenia.  Hepatitis C negative.  Hepatitis B negative.  Ultrasound right upper quadrant concerning for cirrhosis.  Will empirically start folic acid with MCV being high.  COPD (chronic obstructive pulmonary disease) ([place]) Continue home  nebulizers.  Hypokalemia Replace potassium while on Lasix drip.  Impaired fasting glucose Last hemoglobin A1c 6.3.  Patient not a diabetic at this point.  Obesity (BMI 30-39.9) BMI 31.06        Subjective: Patient seen sitting in the chair.  Was a little more sleepy today.  Answer questions appropriately.  He did not want me to get a blood gas.  I told him his weight was a little bit higher today  Physical Exam: Vitals:   11/24/22 2335 11/25/22 0449 11/25/22 0755 11/25/22 1134  BP: (!) 89/64 (!) 131/94 (!) 137/93 (!) 133/91  Pulse: 70 76 76 74  Resp: '18 18 18 19  '$ Temp: 97.7 F (36.5 C) 97.6 F (36.4 C) 98.2 F (36.8 C) 98.1 F (36.7 C)  TempSrc:    Oral  SpO2: 100% 96% 100% 100%  Weight: 99.4 kg     Height:       Physical Exam HENT:     Head: Normocephalic.     Mouth/Throat:     Pharynx: No oropharyngeal exudate.  Eyes:     General: Lids are normal.     Conjunctiva/sclera: Conjunctivae normal.  Cardiovascular:     Rate and Rhythm: Normal rate and regular rhythm.     Heart sounds: Normal heart sounds, S1 normal and S2 normal.  Pulmonary:     Breath sounds: Examination of the right-lower field reveals decreased breath sounds. Examination of the left-lower field reveals decreased breath sounds. Decreased breath sounds present. No wheezing, rhonchi or rales.  Abdominal:  Palpations: Abdomen is soft.     Tenderness: There is no abdominal tenderness.  Musculoskeletal:     Right lower leg: Swelling present.     Left lower leg: Swelling present.  Skin:    General: Skin is warm.     Findings: No rash.  Neurological:     Mental Status: He is alert and oriented to person, place, and time.     Data Reviewed: Creatinine 2.33 and a potassium of 3.6, platelets 94, hemoglobin 15.7, MCV 105  Family Communication: Declined  Disposition: Status is: Inpatient Remains inpatient appropriate because: Still on Lasix drip for diuresis.  Weight up today as compared to  yesterday.  Planned Discharge Destination: Home    Time spent: 28 minutes  Author: Loletha Grayer, MD 11/25/2022 2:53 PM  For on call review [email or web address].

## 2022-11-25 NOTE — Progress Notes (Signed)
[place], [place] 11/25/22  Subjective:   Hospital day # 4 Patient presented to the emergency room for shortness of breath and fluid retention.    Patient seen sitting at side of bed Alert and oriented Feels theres been little progress with fluid removal.    Renal: 12/10 0701 - 12/11 0700 In: -  Out: 2075 [Urine:2075] Lab Results  Component Value Date   CREATININE 2.33 (H) 11/25/2022   CREATININE 2.42 (H) 11/24/2022   CREATININE 2.93 (H) 11/22/2022     Objective:  Vital signs in last 24 hours:  Temp:  [97.6 F (36.4 C)-98.2 F (36.8 C)] 98.1 F (36.7 C) (12/11 1134) Pulse Rate:  [67-76] 74 (12/11 1134) Resp:  [18-20] 19 (12/11 1134) BP: (89-137)/(64-94) 133/91 (12/11 1134) SpO2:  [96 %-100 %] 100 % (12/11 1134) Weight:  [99.4 kg] 99.4 kg (12/10 2335)  Weight change: 1.2 kg Filed Weights   11/23/22 0334 11/24/22 0543 11/24/22 2335  Weight: 102 kg 98.2 kg 99.4 kg    Intake/Output:    Intake/Output Summary (Last 24 hours) at 11/25/2022 1526 Last data filed at 11/25/2022 1135 Gross per 24 hour  Intake 360 ml  Output 2025 ml  Net -1665 ml      Physical Exam: General: Sitting at bedside  HEENT [place] O2, moist oral mucous membranes  Pulm/lungs Bilateral pulmonary crackles  CVS/Heart Irregular rhythm  Abdomen:  Soft, distended  Extremities: 2+ pitting edema on arms and legs  Neurologic: Alert, oriented  Skin: Warm          Basic Metabolic Panel:  Recent Labs  Lab 11/20/22 1217 11/21/22 0744 11/22/22 0446 11/24/22 1008 11/25/22 0521  NA 141 140 140 138 140  K 3.9 3.8 4.0 3.2* 3.6  CL 95* 101 96* 92* 91*  CO2 31 32 37* 39* 41*  GLUCOSE 108* 110* 150* 196* 113*  BUN 73* 61* 62* 49* 44*  CREATININE 3.33* 2.84* 2.93* 2.42* 2.33*  CALCIUM 9.4 8.3* 9.1 9.2 9.0  MG 2.5* 2.3 2.5* 2.3 2.2      CBC: Recent Labs  Lab 11/20/22 1217 11/21/22 0744 11/25/22 0521  WBC 6.5 6.3 7.0  NEUTROABS  --  4.6  --   HGB 16.5  15.7 15.7  HCT 53.7* 54.5* 52.1*  MCV 103.7* 109.0* 105.9*  PLT 91* 94* 94*       Lab Results  Component Value Date   HEPBSAG NON REACTIVE 11/21/2022      Microbiology:  No results found for this or any previous visit (from the past 240 hour(s)).  Coagulation Studies: No results for input(s): "LABPROT", "INR" in the last 72 hours.   Urinalysis: No results for input(s): "COLORURINE", "LABSPEC", "PHURINE", "GLUCOSEU", "HGBUR", "BILIRUBINUR", "KETONESUR", "PROTEINUR", "UROBILINOGEN", "NITRITE", "LEUKOCYTESUR" in the last 72 hours.  Invalid input(s): "APPERANCEUR"    Imaging: No results found.   Medications:    sodium chloride     furosemide (LASIX) 200 mg in dextrose 5 % 100 mL (2 mg/mL) infusion 8 mg/hr (11/24/22 1802)    aspirin EC  81 mg Oral Daily   enoxaparin (LOVENOX) injection  40 mg Subcutaneous Y19J   folic acid  1 mg Oral Daily   metolazone  2.5 mg Oral Once   midodrine  5 mg Oral TID WC   pantoprazole  40 mg Oral Daily   potassium chloride  20 mEq Oral BID   sodium chloride flush  3 mL Intravenous Q12H   sodium chloride, acetaminophen, ipratropium-albuterol, ondansetron (ZOFRAN) IV, mouth  rinse, sodium chloride flush  Assessment/ Plan:  76 y.o. male with COPD, hyperlipidemia, hypertension, congestive heart failure, chronic kidney disease   admitted on 11/20/2022 for CHF (congestive heart failure) (HCC) [I50.9] Peripheral edema [R60.9] Acute on chronic heart failure (HCC) [I50.9]  #Acute kidney injury on chronic kidney disease stage IIIb Baseline creatinine 1.67/GFR 42 from 10/15/2022.  Underlying CKD secondary to atherosclerosis, hypertension Renal function remains stable, good urine output, 2L. Continue furosemide drip at current rate.    #Acute on chronic heart failure with preserved EF, grade 1 diastolic dysfunction.  With volume overload. LVEF 50 to 55% from October 2023. Continue IV Lasix drip.    LOS: [place] 12/11/20233:26  PM  Salamatof Tarnov, [place]

## 2022-11-25 NOTE — TOC Progression Note (Signed)
Transition of Care [facility]) - Progression Note    Patient Details  Name: Luie Laneve MRN: [id number] Date of Birth: 13-Jan-1946  Transition of Care [facility]) CM/SW Contact  Laurena Slimmer, RN Phone Number: 11/25/2022, 11:47 AM  Clinical Narrative:    Will continue to monitor for changes in care, or changes in disposition.   Expected Discharge Plan: [place] Barriers to Discharge: Continued Medical Work up  Expected Discharge Plan and Services Expected Discharge Plan: [place]   Discharge Planning Services: CM Consult Post Acute Care Choice: Resumption of Svcs/PTA Provider, Home Health Living arrangements for the past 2 months: Single Family Home                 DME Arranged: N/A DME Agency: NA       HH Arranged: RN [place] Agency: [facility] Date [place]: 11/21/22 Time [place]: 7530 Representative spoke with at [place]: [place] [employer]   Social Determinants of Health ([place]) Interventions    Readmission Risk Interventions     No data to display

## 2022-11-26 DIAGNOSIS — J9621 Acute and chronic respiratory failure with hypoxia: Secondary | ICD-10-CM | POA: Diagnosis not present

## 2022-11-26 DIAGNOSIS — R601 Generalized edema: Secondary | ICD-10-CM | POA: Diagnosis not present

## 2022-11-26 DIAGNOSIS — N179 Acute kidney failure, unspecified: Secondary | ICD-10-CM | POA: Diagnosis not present

## 2022-11-26 DIAGNOSIS — I5033 Acute on chronic diastolic (congestive) heart failure: Secondary | ICD-10-CM | POA: Diagnosis not present

## 2022-11-26 LAB — BASIC METABOLIC PANEL
Anion gap: 8 (ref 5–15)
BUN: 44 mg/dL — ABNORMAL HIGH (ref 8–23)
CO2: 42 mmol/L — ABNORMAL HIGH (ref 22–32)
Calcium: 9.1 mg/dL (ref 8.9–10.3)
Chloride: 90 mmol/L — ABNORMAL LOW (ref 98–111)
Creatinine, Ser: 2.28 mg/dL — ABNORMAL HIGH (ref 0.61–1.24)
GFR, Estimated: 29 mL/min — ABNORMAL LOW (ref >60)
Glucose, Bld: 103 mg/dL — ABNORMAL HIGH (ref 70–99)
Potassium: 3.7 mmol/L (ref 3.5–5.1)
Sodium: 140 mmol/L (ref 135–145)

## 2022-11-26 LAB — AMMONIA: Ammonia: 37 umol/L — ABNORMAL HIGH (ref 9–35)

## 2022-11-26 MED ORDER — ACETAZOLAMIDE SODIUM 500 MG IJ SOLR
250.0000 mg | Freq: Two times a day (BID) | INTRAMUSCULAR | Status: AC
  Administered 2022-11-26 (×2): 250 mg via INTRAVENOUS
  Filled 2022-11-26 (×2): qty 250

## 2022-11-26 MED ORDER — LACTULOSE 10 GM/15ML PO SOLN
10.0000 g | Freq: Every day | ORAL | Status: DC
  Administered 2022-11-26 – 2022-11-27 (×2): 10 g via ORAL
  Filled 2022-11-26 (×2): qty 30

## 2022-11-26 MED ORDER — STERILE WATER FOR INJECTION IJ SOLN
INTRAMUSCULAR | Status: AC
  Filled 2022-11-26: qty 10

## 2022-11-26 NOTE — Care Management Important Message (Signed)
Important Message  Patient Details  Name: John [name] MRN: [id number] Date of Birth: Jul 08, 1946   Medicare Important Message Given:  N/A - LOS <3 / Initial given by admissions     [name] 11/26/2022, 10:49 AM

## 2022-11-26 NOTE — Progress Notes (Signed)
Progress Note   Patient: John [name] JOI:[id number] DOB: 01-15-1946 DOA: 11/20/2022     5 DOS: the patient was seen and examined on 11/26/2022   Brief hospital course: 76 year old man with chronic diastolic congestive heart failure, chronic hypoxic respiratory failure on 2 L oxygen, COPD, hypertension, chronic kidney disease.  He presented to the hospital with increased lower extremity swelling and fatigue.  He was placed on Lasix drip.  The patient was hypoxic so an ABG was drawn and pCO2 was elevated at 77.  Patient declines using BiPAP.  12/9.  Patient declined lab draws.  Still on Lasix drip.  Case discussed with nephrology.  Hopefully will allow lab draws tomorrow.  12/10.  Patient states his swelling has gone down a little bit.  His weight has come down from 229 down to 216.49 pounds.  Nephrology would like to continue Lasix drip.  12/11.  Weight up to 219 pounds.  Continue Lasix drip.  Creatinine improved down to 2.33.  Given 1 dose of Zaroxolyn.  12/12.  Weight down to 213.  With CO2 rising on chemistry will give 2 doses of Diamox today.  Creatinine down to 2.28.    Assessment and Plan: * Acute on chronic diastolic CHF (congestive heart failure) ([place]) Most recent EF 50%.  Continue IV Lasix drip at 8 mg an hour.  Will give 2 doses of Diamox.  Anasarca Continue Lasix drip to remove fluid.  Legs are definitely less tight than they were when he came in but still quite a bit of fluid.  Acute on chronic respiratory failure with hypoxia and hypercapnia (HCC) ABG showing a pCO2 of 77, pO2 of 59.  Patient will refuse BiPAP.  Try to keep oxygen as low as possible rather have more hypoxic at 86%.  Patient was on 4 L this morning.  I asked nursing staff to taper down to 2 or 3 L.  Acute kidney injury superimposed on chronic kidney disease ([place]) Acute kidney injury on CKD stage IIIb.  Creatinine 3.4 on presentation and last creatinine 2.28.  Appreciate nephrology  consultation.  Thrombocytopenia (HCC) Chronic thrombocytopenia.  Hepatitis C negative.  Hepatitis B negative.  Ultrasound right upper quadrant concerning for cirrhosis.  Will empirically start folic acid with MCV being high.  Will also start lactulose.  COPD (chronic obstructive pulmonary disease) ([place]) Continue home nebulizers.  Hypokalemia Replace potassium while on Lasix drip.  Impaired fasting glucose Last hemoglobin A1c 6.3.  Patient not a diabetic at this point.  Obesity (BMI 30-39.9) BMI 30.59        Subjective: Patient feeling a still swollen.  Urinating well.  States his breathing is always okay.  Admitted with acute respiratory failure, CHF and anasarca.  Physical Exam: Vitals:   11/26/22 0500 11/26/22 0725 11/26/22 1139 11/26/22 1142  BP:  121/82 108/74   Pulse:  76 74   Resp:  19 19   Temp:  98.2 F (36.8 C) 98 F (36.7 C)   TempSrc:  Oral Oral   SpO2:  100% 92% 98%  Weight: 96.7 kg     Height:       Physical Exam HENT:     Head: Normocephalic.     Mouth/Throat:     Pharynx: No oropharyngeal exudate.  Eyes:     General: Lids are normal.     Conjunctiva/sclera: Conjunctivae normal.  Cardiovascular:     Rate and Rhythm: Normal rate and regular rhythm.     Heart sounds: Normal heart sounds, S1 normal and  S2 normal.  Pulmonary:     Breath sounds: Examination of the right-lower field reveals decreased breath sounds. Examination of the left-lower field reveals decreased breath sounds. Decreased breath sounds present. No wheezing, rhonchi or rales.  Abdominal:     Palpations: Abdomen is soft.     Tenderness: There is no abdominal tenderness.  Musculoskeletal:     Right lower leg: Swelling present.     Left lower leg: Swelling present.  Skin:    General: Skin is warm.     Findings: No rash.  Neurological:     Mental Status: He is alert and oriented to person, place, and time.     Data Reviewed: Potassium 3.7, creatinine 2.28, CO2 creeping up to 42,  last platelet count 94, hemoglobin 15.7  Family Communication: Declined  Disposition: Status is: Inpatient Remains inpatient appropriate because: Nephrology continuing Lasix drip at this point  Planned Discharge Destination: Home    Time spent: 28 minutes  Author: Loletha Grayer, MD 11/26/2022 12:26 PM  For on call review [email or web address].

## 2022-11-26 NOTE — Progress Notes (Signed)
[place], [place] 11/26/22  Subjective:   Hospital day # 5 Patient presented to the emergency room for shortness of breath and fluid retention.    Patient seen sitting up at side of bed, alert and oriented Patient states he does not feel much improvement since admission Remains on 4 L nasal cannula Lower extremity edema slowly improving   Renal: 12/11 0701 - 12/12 0700 In: 840 [P.O.:840] Out: 4050 [Urine:4050] Lab Results  Component Value Date   CREATININE 2.28 (H) 11/26/2022   CREATININE 2.33 (H) 11/25/2022   CREATININE 2.42 (H) 11/24/2022     Objective:  Vital signs in last 24 hours:  Temp:  [97.8 F (36.6 C)-98.3 F (36.8 C)] 98 F (36.7 C) (12/12 1139) Pulse Rate:  [71-85] 74 (12/12 1139) Resp:  [15-20] 19 (12/12 1139) BP: (108-123)/(74-86) 108/74 (12/12 1139) SpO2:  [92 %-100 %] 98 % (12/12 1142) Weight:  [96.7 kg] 96.7 kg (12/12 0500)  Weight change: -2.7 kg Filed Weights   11/24/22 0543 11/24/22 2335 11/26/22 0500  Weight: 98.2 kg 99.4 kg 96.7 kg    Intake/Output:    Intake/Output Summary (Last 24 hours) at 11/26/2022 1442 Last data filed at 11/26/2022 1141 Gross per 24 hour  Intake 720 ml  Output 4025 ml  Net -3305 ml      Physical Exam: General: Sitting at bedside  HEENT [place] O2, moist oral mucous membranes  Pulm/lungs Bilateral pulmonary crackles  CVS/Heart Irregular rhythm  Abdomen:  Soft, distended  Extremities: 2+ pitting edema on arms and legs  Neurologic: Alert, oriented  Skin: Warm          Basic Metabolic Panel:  Recent Labs  Lab 11/20/22 1217 11/21/22 0744 11/22/22 0446 11/24/22 1008 11/25/22 0521 11/26/22 0223  NA 141 140 140 138 140 140  K 3.9 3.8 4.0 3.2* 3.6 3.7  CL 95* 101 96* 92* 91* 90*  CO2 31 32 37* 39* 41* 42*  GLUCOSE 108* 110* 150* 196* 113* 103*  BUN 73* 61* 62* 49* 44* 44*  CREATININE 3.33* 2.84* 2.93* 2.42* 2.33* 2.28*  CALCIUM 9.4 8.3* 9.1 9.2 9.0 9.1  MG 2.5* 2.3 2.5*  2.3 2.2  --       CBC: Recent Labs  Lab 11/20/22 1217 11/21/22 0744 11/25/22 0521  WBC 6.5 6.3 7.0  NEUTROABS  --  4.6  --   HGB 16.5 15.7 15.7  HCT 53.7* 54.5* 52.1*  MCV 103.7* 109.0* 105.9*  PLT 91* 94* 94*       Lab Results  Component Value Date   HEPBSAG NON REACTIVE 11/21/2022      Microbiology:  No results found for this or any previous visit (from the past 240 hour(s)).  Coagulation Studies: No results for input(s): "LABPROT", "INR" in the last 72 hours.   Urinalysis: No results for input(s): "COLORURINE", "LABSPEC", "PHURINE", "GLUCOSEU", "HGBUR", "BILIRUBINUR", "KETONESUR", "PROTEINUR", "UROBILINOGEN", "NITRITE", "LEUKOCYTESUR" in the last 72 hours.  Invalid input(s): "APPERANCEUR"    Imaging: No results found.   Medications:    sodium chloride     furosemide (LASIX) 200 mg in dextrose 5 % 100 mL (2 mg/mL) infusion 8 mg/hr (11/25/22 2021)    acetaZOLAMIDE  250 mg Intravenous Q12H   aspirin EC  81 mg Oral Daily   enoxaparin (LOVENOX) injection  40 mg Subcutaneous D14H   folic acid  1 mg Oral Daily   lactulose  10 g Oral Daily   midodrine  5 mg Oral TID WC   pantoprazole  40 mg Oral Daily   potassium chloride  20 mEq Oral BID   sodium chloride flush  3 mL Intravenous Q12H   sodium chloride, acetaminophen, ipratropium-albuterol, ondansetron (ZOFRAN) IV, mouth rinse, sodium chloride flush  Assessment/ Plan:  76 y.o. male with COPD, hyperlipidemia, hypertension, congestive heart failure, chronic kidney disease   admitted on 11/20/2022 for CHF (congestive heart failure) (HCC) [I50.9] Peripheral edema [R60.9] Acute on chronic heart failure (HCC) [I50.9]  #Acute kidney injury on chronic kidney disease stage IIIb Baseline creatinine 1.67/GFR 42 from 10/15/2022.  Underlying CKD secondary to atherosclerosis, hypertension Creatinine remained stable.  One-time dose of metolazone given yesterday.  Urine output 4 L in preceding 24 hours.  Edema slowly  improving.  Daily weight 213 pounds.  Recommend standing weight less than 210 pounds prior to considering discharge.  Patient will need increased diuretic for home regimen.   #Acute on chronic heart failure with preserved EF, grade 1 diastolic dysfunction.  With volume overload. LVEF 50 to 55% from October 2023. Continue IV Lasix drip.    LOS: [place] 12/12/20232:42 PM  [place], [place]

## 2022-11-26 NOTE — Progress Notes (Signed)
Occupational Therapy Treatment Patient Details Name: John [name] MRN: [id number] DOB: 12-06-1946 Today's Date: 11/26/2022   History of present illness 76 y/o male presented to ED on 11/20/22 for SOB and retaining fluids. Admitted for CHF exacerbation. PMH: diastolic CHF, CKD stage IV, HTN, HLD, COPD on 2L chronically, erythrocytosis, cor pulmonale   OT comments  John [name] was seen for OT treatment on this date. Upon arrival to room pt seated EOB, agreeable to tx. Pt defers all mobility, cites plan to return to home and the [facility] tmrw for exercise. Educated on HEP, edema prevention, and ECS. Pt making good progress toward goals, will continue to follow POC. Discharge recommendation remains appropriate.     Recommendations for follow up therapy are one component of a multi-disciplinary discharge planning process, led by the attending physician.  Recommendations may be updated based on patient status, additional functional criteria and insurance authorization.    Follow Up Recommendations  No OT follow up     Assistance Recommended at Discharge Set up Supervision/Assistance  Patient can return home with the following  A little help with walking and/or transfers;Assistance with cooking/housework;Help with stairs or ramp for entrance;Assist for transportation   Equipment Recommendations  Other (comment)    Recommendations for Other Services      Precautions / Restrictions Precautions Precautions: Fall Restrictions Weight Bearing Restrictions: No       Mobility Bed Mobility               General bed mobility comments: pt deferred    Transfers                   General transfer comment: pt deferred     Balance Overall balance assessment: Mild deficits observed, not formally tested                                         ADL either performed or assessed with clinical judgement   ADL Overall ADL's : Needs assistance/impaired                                        General ADL Comments: MOD I seated grooming tasks      Cognition Arousal/Alertness: Awake/alert Behavior During Therapy: WFL for tasks assessed/performed Overall Cognitive Status: Within Functional Limits for tasks assessed                                            Frequency  Min 2X/week        Progress Toward Goals  OT Goals(current goals can now be found in the care plan section)  Progress towards OT goals: Progressing toward goals  Acute Rehab OT Goals Patient Stated Goal: to go home OT Goal Formulation: With patient Time For Goal Achievement: 12/05/22 Potential to Achieve Goals: Good ADL Goals Pt Will Perform Grooming: with modified independence;standing Pt Will Transfer to Toilet: with modified independence;ambulating Pt Will Perform Toileting - Clothing Manipulation and hygiene: with modified independence;sit to/from stand Pt Will Perform Tub/Shower Transfer: with modified independence;ambulating  Plan Discharge plan remains appropriate;Frequency remains appropriate    Co-evaluation  AM-PAC OT "6 Clicks" Daily Activity     Outcome Measure   Help from another person eating meals?: None Help from another person taking care of personal grooming?: None Help from another person toileting, which includes using toliet, bedpan, or urinal?: A Little Help from another person bathing (including washing, rinsing, drying)?: A Little Help from another person to put on and taking off regular upper body clothing?: None Help from another person to put on and taking off regular lower body clothing?: A Little 6 Click Score: 21    End of Session    OT Visit Diagnosis: Unsteadiness on feet (R26.81);Muscle weakness (generalized) (M62.81)   Activity Tolerance Patient tolerated treatment well   Patient Left in bed;with call bell/phone within reach   Nurse Communication          Time:  0350-0938 OT Time Calculation (min): 13 min  Charges: OT General Charges $OT Visit: 1 Visit OT Treatments $Self Care/Home Management : 8-22 mins  Dessie Coma, M.S. OTR/L  11/26/22, 3:55 PM  ascom [phone]

## 2022-11-27 ENCOUNTER — Ambulatory Visit: Payer: Self-pay | Admitting: Urology

## 2022-11-27 DIAGNOSIS — I5033 Acute on chronic diastolic (congestive) heart failure: Secondary | ICD-10-CM | POA: Diagnosis not present

## 2022-11-27 LAB — PROTEIN ELECTROPHORESIS, SERUM
A/G Ratio: 1 (ref 0.7–1.7)
Albumin ELP: 2.8 g/dL — ABNORMAL LOW (ref 2.9–4.4)
Alpha-1-Globulin: 0.3 g/dL (ref 0.0–0.4)
Alpha-2-Globulin: 0.4 g/dL (ref 0.4–1.0)
Beta Globulin: 0.9 g/dL (ref 0.7–1.3)
Gamma Globulin: 1.1 g/dL (ref 0.4–1.8)
Globulin, Total: 2.7 g/dL (ref 2.2–3.9)
Total Protein ELP: 5.5 g/dL — ABNORMAL LOW (ref 6.0–8.5)

## 2022-11-27 LAB — BASIC METABOLIC PANEL
Anion gap: 9 (ref 5–15)
BUN: 41 mg/dL — ABNORMAL HIGH (ref 8–23)
CO2: 43 mmol/L — ABNORMAL HIGH (ref 22–32)
Calcium: 9.4 mg/dL (ref 8.9–10.3)
Chloride: 88 mmol/L — ABNORMAL LOW (ref 98–111)
Creatinine, Ser: 2.24 mg/dL — ABNORMAL HIGH (ref 0.61–1.24)
GFR, Estimated: 29 mL/min — ABNORMAL LOW (ref >60)
Glucose, Bld: 96 mg/dL (ref 70–99)
Potassium: 3.5 mmol/L (ref 3.5–5.1)
Sodium: 140 mmol/L (ref 135–145)

## 2022-11-27 MED ORDER — TORSEMIDE 60 MG PO TABS: 60.0000 mg | ORAL_TABLET | Freq: Every day | ORAL | 1 refills | Status: DC

## 2022-11-27 MED ORDER — TORSEMIDE 20 MG PO TABS
60.0000 mg | ORAL_TABLET | Freq: Every day | ORAL | Status: DC
  Administered 2022-11-27: 60 mg via ORAL
  Filled 2022-11-27: qty 3

## 2022-11-27 MED ORDER — LACTULOSE 10 GM/15ML PO SOLN: 10.0000 g | Freq: Every day | ORAL | 0 refills | Status: DC | PRN

## 2022-11-27 MED ORDER — POTASSIUM CHLORIDE CRYS ER 20 MEQ PO TBCR: 20.0000 meq | EXTENDED_RELEASE_TABLET | Freq: Every day | ORAL | 1 refills | Status: DC

## 2022-11-27 NOTE — TOC Transition Note (Signed)
Transition of Care [facility]) - CM/SW Discharge Note   Patient Details  Name: John [name] MRN: [id number] Date of Birth: 1946/12/16  [facility] CM/SW Contact:  Tiburcio Bash, LCSW Phone Number: 11/27/2022, 11:49 AM   Clinical Narrative:     Patient to discharge home today with [facility] PT and RN, kel[facility] informed of dc. Patient's friend to pick up today. No further dc needs.   Final next level of care: [place] Barriers to Discharge: Continued Medical Work up   Patient Goals and CMS Choice Patient states their goals for this hospitalization and ongoing recovery are:: to feel better and get back home CMS Medicare.gov Compare Post Acute Care list provided to:: Patient Choice offered to / list presented to : Patient  Discharge Placement                       Discharge Plan and Services   Discharge Planning Services: CM Consult Post Acute Care Choice: Resumption of Svcs/PTA Provider, Home Health          DME Arranged: N/A DME Agency: NA       HH Arranged: RN [place] Agency: [facility] Date [place]: 11/21/22 Time [place]: 3754 Representative spoke with at [place]: [place] [employer]  Social Determinants of Health ([place]) Interventions     Readmission Risk Interventions     No data to display

## 2022-11-27 NOTE — Progress Notes (Signed)
[place], [place] 11/27/22  Subjective:   Hospital day # 6 Patient presented to the emergency room for shortness of breath and fluid retention.    Patient seen sitting at bedside Alert and oriented States he feels well today Urine output 5.3 L in preceding 24 hours Daily weight 201 pounds, 91.5 kg Lower extremity edema remains yet improved   Renal: 12/12 0701 - 12/13 0700 In: 959.2 [P.O.:720; I.V.:239.2] Out: 5300 [Urine:5300] Lab Results  Component Value Date   CREATININE 2.24 (H) 11/27/2022   CREATININE 2.28 (H) 11/26/2022   CREATININE 2.33 (H) 11/25/2022     Objective:  Vital signs in last 24 hours:  Temp:  [97.7 F (36.5 C)-98.4 F (36.9 C)] 97.7 F (36.5 C) (12/13 0751) Pulse Rate:  [66-82] 80 (12/13 0751) Resp:  [16-20] 16 (12/13 0751) BP: (101-130)/(69-95) 130/95 (12/13 0751) SpO2:  [94 %-100 %] 94 % (12/13 0751) Weight:  [91.5 kg] 91.5 kg (12/13 0423)  Weight change: -5.21 kg Filed Weights   11/24/22 2335 11/26/22 0500 11/27/22 0423  Weight: 99.4 kg 96.7 kg 91.5 kg    Intake/Output:    Intake/Output Summary (Last 24 hours) at 11/27/2022 1545 Last data filed at 11/27/2022 1154 Gross per 24 hour  Intake 839.19 ml  Output 4725 ml  Net -3885.81 ml      Physical Exam: General: Sitting at bedside  HEENT [place] O2, moist oral mucous membranes  Pulm/lungs Bilateral pulmonary crackles  CVS/Heart Irregular rhythm  Abdomen:  Soft, distended  Extremities: 2+ pitting edema on legs  Neurologic: Alert, oriented  Skin: Warm          Basic Metabolic Panel:  Recent Labs  Lab 11/21/22 0744 11/22/22 0446 11/24/22 1008 11/25/22 0521 11/26/22 0223 11/27/22 0359  NA 140 140 138 140 140 140  K 3.8 4.0 3.2* 3.6 3.7 3.5  CL 101 96* 92* 91* 90* 88*  CO2 32 37* 39* 41* 42* 43*  GLUCOSE 110* 150* 196* 113* 103* 96  BUN 61* 62* 49* 44* 44* 41*  CREATININE 2.84* 2.93* 2.42* 2.33* 2.28* 2.24*  CALCIUM 8.3* 9.1 9.2 9.0 9.1 9.4   MG 2.3 2.5* 2.3 2.2  --   --       CBC: Recent Labs  Lab 11/21/22 0744 11/25/22 0521  WBC 6.3 7.0  NEUTROABS 4.6  --   HGB 15.7 15.7  HCT 54.5* 52.1*  MCV 109.0* 105.9*  PLT 94* 94*       Lab Results  Component Value Date   HEPBSAG NON REACTIVE 11/21/2022      Microbiology:  No results found for this or any previous visit (from the past 240 hour(s)).  Coagulation Studies: No results for input(s): "LABPROT", "INR" in the last 72 hours.   Urinalysis: No results for input(s): "COLORURINE", "LABSPEC", "PHURINE", "GLUCOSEU", "HGBUR", "BILIRUBINUR", "KETONESUR", "PROTEINUR", "UROBILINOGEN", "NITRITE", "LEUKOCYTESUR" in the last 72 hours.  Invalid input(s): "APPERANCEUR"    Imaging: No results found.   Medications:    sodium chloride      aspirin EC  81 mg Oral Daily   enoxaparin (LOVENOX) injection  40 mg Subcutaneous [id number]   folic acid  1 mg Oral Daily   lactulose  10 g Oral Daily   midodrine  5 mg Oral TID WC   pantoprazole  40 mg Oral Daily   potassium chloride  20 mEq Oral BID   sodium chloride flush  3 mL Intravenous Q12H   torsemide  60 mg Oral Daily   sodium  chloride, acetaminophen, ipratropium-albuterol, ondansetron (ZOFRAN) IV, mouth rinse, sodium chloride flush  Assessment/ Plan:  76 y.o. male with COPD, hyperlipidemia, hypertension, congestive heart failure, chronic kidney disease   admitted on 11/20/2022 for CHF (congestive heart failure) (HCC) [I50.9] Peripheral edema [R60.9] Acute on chronic heart failure (HCC) [I50.9]  #Acute kidney injury on chronic kidney disease stage IIIb Baseline creatinine 1.67/GFR 42 from 10/15/2022.  Underlying CKD secondary to atherosclerosis, hypertension Renal function remains stable.  Urine output of 5.3 L recorded in previous 24 hours.  Edema remains present however less tight, now pitting.  Daily standing weight of 91.5 kg.  Patient cleared to discharge from renal stance with outpatient follow-up in our  office in 1 to 2 weeks.  Recommend discharging on torsemide 60 mg daily.   #Acute on chronic heart failure with preserved EF, grade 1 diastolic dysfunction.  With volume overload. LVEF 50 to 55% from October 2023. Torsemide as above.    LOS: [place] 12/13/20233:45 PM  [facility] [place], [place]

## 2022-11-27 NOTE — Discharge Summary (Signed)
Physician Discharge Summary  John [name] GYI:[id number] DOB: [birth year] DOA: 11/20/2022  PCP: Tracie Harrier, MD  Admit date: 11/20/2022 Discharge date: 11/27/2022  Admitted From: Home Disposition:  Home  Recommendations for Outpatient Follow-up:  Follow up with PCP in 1-2 weeks Follow-up outpatient nephrology  Home Health: No Equipment/Devices: Oxygen 2 L via nasal cannula  Discharge Condition: Stable CODE STATUS: Full Diet recommendation: Heart healthy  Brief/Interim Summary: 76 year old man with chronic diastolic congestive heart failure, chronic hypoxic respiratory failure on 2 L oxygen, COPD, hypertension, chronic kidney disease.  He presented to the hospital with increased lower extremity swelling and fatigue.  He was placed on Lasix drip.   The patient was hypoxic so an ABG was drawn and pCO2 was elevated at 77.  Patient declines using BiPAP.   12/9.  Patient declined lab draws.  Still on Lasix drip.  Case discussed with nephrology.  Hopefully will allow lab draws tomorrow.   12/10.  Patient states his swelling has gone down a little bit.  His weight has come down from 229 down to 216.49 pounds.  Nephrology would like to continue Lasix drip.   12/11.  Weight up to 219 pounds.  Continue Lasix drip.  Creatinine improved down to 2.33.  Given 1 dose of Zaroxolyn.   12/12.  Weight down to 213.  With CO2 rising on chemistry will give 2 doses of Diamox today.  Creatinine down to 2.28.  12/13: Discussed with nephrology.  Patient down to goal weight.  Stable for discharge.  Per nephrology recommendations will discharge on Zaroxolyn 2.5 mg twice weekly, Demadex 60 mg daily.  Follow-up outpatient PCP and nephrology.    Discharge Diagnoses:  Principal Problem:   Acute on chronic diastolic CHF (congestive heart failure) (HCC) Active Problems:   Acute on chronic respiratory failure with hypoxia and hypercapnia (HCC)   Anasarca   Acute kidney injury superimposed on chronic  kidney disease (HCC)   Thrombocytopenia (HCC)   COPD (chronic obstructive pulmonary disease) (HCC)   Obesity (BMI 30-39.9)   Impaired fasting glucose   Hypokalemia  * Acute on chronic diastolic CHF (congestive heart failure) ([place]) Most recent EF 50%.  Dry, at goal weight.  Discontinue Lasix gtt.  Discharged on torsemide 60 mg daily and Zaroxolyn   Anasarca See above.  Volume status improved   Acute on chronic respiratory failure with hypoxia and hypercapnia (HCC) ABG showing a pCO2 of 77, pO2 of 59.  Patient will refuse BiPAP.  Patient on baseline 2 to 3 L    Acute kidney injury superimposed on chronic kidney disease ([place]) Acute kidney injury on CKD stage IIIb.  Creatinine 3.4 on presentation and last creatinine 2.28.  Appreciate nephrology consultation.    Discharge Instructions  Discharge Instructions     Diet - low sodium heart healthy   Complete by: As directed    Increase activity slowly   Complete by: As directed       Allergies as of 11/27/2022   No Known Allergies      Medication List     TAKE these medications    aspirin EC 81 MG tablet Take 1 tablet (81 mg total) by mouth daily. Swallow whole.   calcitRIOL 0.25 MCG capsule Commonly known as: ROCALTROL Take 0.25 mcg by mouth daily.   gabapentin 100 MG capsule Commonly known as: NEURONTIN Take 1 capsule (100 mg total) by mouth 2 (two) times daily.   ipratropium-albuterol 0.5-2.5 (3) MG/3ML Soln Commonly known as: DUONEB Inhale 3 mLs into the  lungs every 6 (six) hours as needed (wheezing).   lactulose 10 GM/15ML solution Commonly known as: CHRONULAC Take 15 mLs (10 g total) by mouth daily as needed for mild constipation.   metolazone 2.5 MG tablet Commonly known as: ZAROXOLYN Take 1 tablet (2.5 mg total) by mouth 2 (two) times a week.   midodrine 5 MG tablet Commonly known as: PROAMATINE Take 1 tablet (5 mg total) by mouth 3 (three) times daily with meals.   omeprazole 20 MG capsule Commonly  known as: PRILOSEC Take 20 mg by mouth daily.   potassium chloride SA 20 MEQ tablet Commonly known as: KLOR-CON M Take 1 tablet (20 mEq total) by mouth daily.   Torsemide 60 MG Tabs Take 60 mg by mouth daily. What changed:  medication strength how much to take        No Known Allergies  Consultations: Nephrology   Procedures/Studies: US Abdomen Complete  Result Date: 11/20/2022 CLINICAL DATA:  Thrombocytopenia. Abdominal distension. EXAM: ABDOMEN ULTRASOUND COMPLETE COMPARISON:  CT scan 02/07/2022 FINDINGS: Gallbladder: Echogenic sludge and gallstones are noted the gallbladder. The largest calculus measures 6 mm. Associated pericholecystic fluid no sonographic Murphy sign. Common bile duct: Diameter: 5.3 mm Liver: The liver contour is irregular and could reflect changes of cirrhosis. No hepatic lesions or intrahepatic biliary dilatation. Portal vein is patent on color Doppler imaging with normal direction of blood flow towards the liver. IVC: Mildly dilated up to 3.5 cm. Pancreas: Visualized portion unremarkable. Spleen: Normal size. Right Kidney: Length: 10.1 cm. Diffuse increased echogenicity suggesting medical renal disease. Normal renal cortical thickness and no hydronephrosis. Left Kidney: Length: 10.5 cm. Diffuse increased echogenicity suggesting medical renal disease. Normal renal cortical thickness and no hydronephrosis. Abdominal aorta: Normal caliber. Other findings: None. IMPRESSION: 1. Echogenic sludge and gallstones in the gallbladder. Gallbladder wall thickening likely due to cirrhosis and abdominal fluid. 2. Normal caliber common bile duct. 3. Slight irregularity of the liver contour could reflect changes of cirrhosis. No hepatic lesions or intrahepatic biliary dilatation. 4. Normal spleen. 5. Increased echogenicity of both kidneys suggesting medical renal disease. Electronically Signed   By: Marijo Sanes M.D.   On: 11/20/2022 16:29   DG Chest 2 View  Result Date:  11/20/2022 CLINICAL DATA:  Shortness of breath.  Heart failure. EXAM: CHEST - 2 VIEW COMPARISON:  10/02/2022 FINDINGS: Lateral view degraded by patient arm position. Midline trachea. Mild cardiomegaly. Atherosclerosis in the transverse aorta. Tiny bilateral pleural effusions. No pneumothorax. Interstitial prominence and indistinctness is increased. Mild bibasilar atelectasis. IMPRESSION: Cardiomegaly with mild pulmonary venous congestion superimposed upon COPD/chronic bronchitis. Tiny bilateral pleural effusions. Aortic Atherosclerosis (ICD10-I70.0). Electronically Signed   By: Abigail Miyamoto M.D.   On: 11/20/2022 12:08      Subjective: Seen and examined on the day of discharge.  Stable no distress.  Stable for DC home.  Discharge Exam: Vitals:   11/27/22 0421 11/27/22 0751  BP: 101/73 (!) 130/95  Pulse: 82 80  Resp: 20 16  Temp: 98.3 F (36.8 C) 97.7 F (36.5 C)  SpO2: 95% 94%   Vitals:   11/26/22 2355 11/27/22 0421 11/27/22 0423 11/27/22 0751  BP: 116/69 101/73  (!) 130/95  Pulse: 78 82  80  Resp: '20 20  16  '$ Temp: 98 F (36.7 C) 98.3 F (36.8 C)  97.7 F (36.5 C)  TempSrc:  Oral    SpO2: 96% 95%  94%  Weight:   91.5 kg   Height:        General:  Pt is alert, awake, not in acute distress Cardiovascular: RRR, S1/S2 +, no rubs, no gallops Respiratory: CTA bilaterally, no wheezing, no rhonchi Abdominal: Soft, NT, ND, bowel sounds + Extremities: no edema, no cyanosis    The results of significant diagnostics from this hospitalization (including imaging, microbiology, ancillary and laboratory) are listed below for reference.     Microbiology: No results found for this or any previous visit (from the past 240 hour(s)).   Labs: BNP (last 3 results) Recent Labs    10/02/22 0858 11/19/22 1333 11/20/22 1217  BNP 1,103.2* 2,861.1* 6,503.5*   Basic Metabolic Panel: Recent Labs  Lab 11/21/22 0744 11/22/22 0446 11/24/22 1008 11/25/22 0521 11/26/22 0223 11/27/22 0359   NA 140 140 138 140 140 140  K 3.8 4.0 3.2* 3.6 3.7 3.5  CL 101 96* 92* 91* 90* 88*  CO2 32 37* 39* 41* 42* 43*  GLUCOSE 110* 150* 196* 113* 103* 96  BUN 61* 62* 49* 44* 44* 41*  CREATININE 2.84* 2.93* 2.42* 2.33* 2.28* 2.24*  CALCIUM 8.3* 9.1 9.2 9.0 9.1 9.4  MG 2.3 2.5* 2.3 2.2  --   --    Liver Function Tests: Recent Labs  Lab 11/21/22 0744 11/22/22 0446 11/24/22 1008 11/25/22 0521  AST '21 23 23 17  '$ ALT '14 15 9 12  '$ ALKPHOS 57 66 61 53  BILITOT 2.5* 2.1* 3.5* 2.5*  PROT 5.9* 7.0 6.3* 6.1*  ALBUMIN 3.0* 3.4* 3.1* 3.0*   No results for input(s): "LIPASE", "AMYLASE" in the last 168 hours. Recent Labs  Lab 11/26/22 0223  AMMONIA 37*   CBC: Recent Labs  Lab 11/21/22 0744 11/25/22 0521  WBC 6.3 7.0  NEUTROABS 4.6  --   HGB 15.7 15.7  HCT 54.5* 52.1*  MCV 109.0* 105.9*  PLT 94* 94*   Cardiac Enzymes: No results for input(s): "CKTOTAL", "CKMB", "CKMBINDEX", "TROPONINI" in the last 168 hours. BNP: Invalid input(s): "POCBNP" CBG: Recent Labs  Lab 11/21/22 2306  GLUCAP 118*   D-Dimer No results for input(s): "DDIMER" in the last 72 hours. Hgb A1c No results for input(s): "HGBA1C" in the last 72 hours. Lipid Profile No results for input(s): "CHOL", "HDL", "LDLCALC", "TRIG", "CHOLHDL", "LDLDIRECT" in the last 72 hours. Thyroid function studies No results for input(s): "TSH", "T4TOTAL", "T3FREE", "THYROIDAB" in the last 72 hours.  Invalid input(s): "FREET3" Anemia work up No results for input(s): "VITAMINB12", "FOLATE", "FERRITIN", "TIBC", "IRON", "RETICCTPCT" in the last 72 hours. Urinalysis    Component Value Date/Time   COLORURINE YELLOW (A) 01/30/2022 2331   APPEARANCEUR CLEAR (A) 01/30/2022 2331   LABSPEC 1.008 01/30/2022 2331   PHURINE 5.0 01/30/2022 2331   GLUCOSEU NEGATIVE 01/30/2022 2331   HGBUR NEGATIVE 01/30/2022 2331   BILIRUBINUR NEGATIVE 01/30/2022 2331   KETONESUR NEGATIVE 01/30/2022 2331   PROTEINUR NEGATIVE 01/30/2022 2331   NITRITE  NEGATIVE 01/30/2022 2331   LEUKOCYTESUR NEGATIVE 01/30/2022 2331   Sepsis Labs Recent Labs  Lab 11/21/22 0744 11/25/22 0521  WBC 6.3 7.0   Microbiology No results found for this or any previous visit (from the past 240 hour(s)).   Time coordinating discharge: Over 30 minutes  SIGNED:   Sidney Ace, MD  [facility] 11/27/2022, 1:42 PM Pager   If 7PM-7AM, please contact night-coverage

## 2022-11-27 NOTE — Progress Notes (Signed)
OT Cancellation Note  Patient Details Name: John [name] MRN: [id number] DOB: 05-20-1946   Cancelled Treatment:    Reason Eval/Treat Not Completed: Other (comment) (pt declined despite encouragement to participate in therapeutic activity, reports he plans to take a shower as soon as he gets home. OT will reattempt as able.Shanon Payor, OTD OTR/L  11/27/22, 11:21 AM

## 2022-11-29 ENCOUNTER — Telehealth: Payer: Self-pay

## 2022-11-29 NOTE — Telephone Encounter (Signed)
Post hospital discharge phone call   Called and spoke with [name], his significant other.  Patient is weighing himself.  He first weighed 202 pounds.  Today weighed 205.    Discussed medications.  He picked up the torsemide 60 mg and is taking one daily as directed.  He also is to take metolazone 2.5 mg twice a week.  He is due to take today.  Instructed needs to take 30 minutes before the torsemide.   Discussed fluid restriction, she felt his cup was a 4  ounce cup and he was only drinking with his medications and his meals.  Asked that she take a measuring cup filled with water see how much he actually  is drinking.  Made aware should not take in any more than 64 ounces daily.  She voices understanding. She states he is not drinking as much now has he had been in the past.  Also discussed his diet. She admitted to cooking with kosher salt and sea salt.  Explained that neither of those were good for [place].  He should be eating foods seasoned with natural spices and herbs.  She also admits that he snacks in the evening on things like salted potato chips and chocolate chip cookies.  She states she has brought plain potato chips in the past and he will not eat those.  Spoke with Darylene Price, FNP regarding his weight gain, since he is taking the metolazone today, have him take another dose on Sunday.  She voices understanding.  She had no further questions.  Made aware will call on Monday to follow up.  Pricilla Riffle RN CHFN

## 2022-12-02 ENCOUNTER — Telehealth: Payer: Self-pay

## 2022-12-02 NOTE — Telephone Encounter (Signed)
Post hospital phone call   Spoke with patient and his room mate, [name].  She states she measured his drinking glass and it holds 8 ounces.  John [name] states when taking his pills each time he uses 4 ounces of water.  Discussed things to do to help  quench his thirst.  Re in forced on Monday and Friday when he takes the metolazone to take it 30 minutes before the torsemide.  He goes on to state when he got the torsemide prescription from the pharmacy it was not for 60 mg.  He plans on calling Dr. Elwyn Lade office, to speak with his nurse.  He states his weights over the weekend where 206, 205 and today was 205.  Reminded to call heart failure clinic for 2-3 pound weight gain over night or 5 pounds in the week.   He had no further questions.  Pricilla Riffle RN CHFN

## 2022-12-18 ENCOUNTER — Other Ambulatory Visit: Payer: Self-pay | Admitting: Family

## 2022-12-18 ENCOUNTER — Encounter: Payer: Self-pay | Admitting: Family

## 2022-12-18 ENCOUNTER — Telehealth: Payer: Self-pay | Admitting: Family

## 2022-12-18 ENCOUNTER — Other Ambulatory Visit
Admission: RE | Admit: 2022-12-18 | Discharge: 2022-12-18 | Disposition: A | Discharge status: Home / Self Care | Payer: Medicare HMO | Source: Ambulatory Visit | Attending: Family | Admitting: Family

## 2022-12-18 ENCOUNTER — Ambulatory Visit (HOSPITAL_BASED_OUTPATIENT_CLINIC_OR_DEPARTMENT_OTHER): Payer: Medicare HMO | Admitting: Family

## 2022-12-18 VITALS — BP 118/84 | HR 99 | Resp 20 | Wt 200.5 lb

## 2022-12-18 DIAGNOSIS — J449 Chronic obstructive pulmonary disease, unspecified: Secondary | ICD-10-CM | POA: Insufficient documentation

## 2022-12-18 DIAGNOSIS — I5032 Chronic diastolic (congestive) heart failure: Secondary | ICD-10-CM

## 2022-12-18 DIAGNOSIS — I10 Essential (primary) hypertension: Secondary | ICD-10-CM

## 2022-12-18 DIAGNOSIS — I890 Lymphedema, not elsewhere classified: Secondary | ICD-10-CM | POA: Diagnosis not present

## 2022-12-18 DIAGNOSIS — N189 Chronic kidney disease, unspecified: Secondary | ICD-10-CM | POA: Insufficient documentation

## 2022-12-18 DIAGNOSIS — Z87891 Personal history of nicotine dependence: Secondary | ICD-10-CM | POA: Diagnosis not present

## 2022-12-18 DIAGNOSIS — E785 Hyperlipidemia, unspecified: Secondary | ICD-10-CM | POA: Diagnosis not present

## 2022-12-18 DIAGNOSIS — I130 Hypertensive heart and chronic kidney disease with heart failure and stage 1 through stage 4 chronic kidney disease, or unspecified chronic kidney disease: Secondary | ICD-10-CM | POA: Insufficient documentation

## 2022-12-18 LAB — BASIC METABOLIC PANEL
Anion gap: 12 (ref 5–15)
BUN: 51 mg/dL — ABNORMAL HIGH (ref 8–23)
CO2: 38 mmol/L — ABNORMAL HIGH (ref 22–32)
Calcium: 9.6 mg/dL (ref 8.9–10.3)
Chloride: 90 mmol/L — ABNORMAL LOW (ref 98–111)
Creatinine, Ser: 2.66 mg/dL — ABNORMAL HIGH (ref 0.61–1.24)
GFR, Estimated: 24 mL/min — ABNORMAL LOW (ref >60)
Glucose, Bld: 158 mg/dL — ABNORMAL HIGH (ref 70–99)
Potassium: 3.1 mmol/L — ABNORMAL LOW (ref 3.5–5.1)
Sodium: 140 mmol/L (ref 135–145)

## 2022-12-18 MED ORDER — POTASSIUM CHLORIDE CRYS ER 20 MEQ PO TBCR: 40.0000 meq | EXTENDED_RELEASE_TABLET | Freq: Every day | ORAL | 5 refills | Status: DC

## 2022-12-18 MED ORDER — POTASSIUM CHLORIDE CRYS ER 20 MEQ PO TBCR: 40.0000 meq | EXTENDED_RELEASE_TABLET | Freq: Every day | ORAL | 1 refills | Status: DC

## 2022-12-18 NOTE — Progress Notes (Signed)
Patient ID: John [name], male    DOB: 02-22-1946, 77 y.o.   MRN: [id number]   John [name] is a 77 y/o male with a history of HTN, CKD, COPD, hyperlipidemia, remote tobacco use and chronic heart failure.   Echo report from 10/02/22 showed an EF of 50-55% along with mild LVH, moderately enlarged right ventricle, mild LAE, moderate RAE and mild/ moderate TR. Echo report from 01/29/22 reviewed and showed an EF of 55-60% along with moderately elevated PA pressure and mild/moderate TR.   Admitted 12/10/22 due to acute on chronic HF. Initially placed on lasix drip. Cardiology consult obtained. Discharged after 7 days. Admitted 10/02/22 due to progressive lower extremity edema and weight gain. Initially given IV lasix. Found to be hypoxic needing O2 at 2L. Nephrology consult obtained. Transitioned to lasix drip and then able to transition to oral diuretics. Metolazone twice weekly started. Losartan stopped. Antibiotics given for right hand cellulitis. PT/OT evaluations done. Discharged after 13 days.   He presents today for a follow-up visit with a chief complaint of moderate fatigue with little exertion. Describes this as chronic in nature. Has associated pedal edema, leg weakness, left foot pain and difficulty sleeping along with this. Denies any abdominal distention, palpitations, chest pain, dizziness, wheezing, shortness of breath, cough or weight gain.   Drinks ~ 20 ounces of fluids daily although difficult to quantify the exact amount. He uses a cup at home to drink out of but doesn't know how many ounces this cup holds.   Past Medical History:  Diagnosis Date   CHF (congestive heart failure) (HCC)    EF 45% in 2021   Chronic kidney disease    COPD (chronic obstructive pulmonary disease) ([place]) 02/16/2020   Hyperlipidemia    Hypertension    Pneumonia 2021    Past Surgical History:  Procedure Laterality Date   CATARACT EXTRACTION W/PHACO Left 10/31/2021   Procedure: CATARACT EXTRACTION  PHACO AND INTRAOCULAR LENS PLACEMENT ([place]) LEFT VISION BLUE 3.45 00:48.0;  Surgeon: Leandrew Koyanagi, MD;  Location: [place];  Service: Ophthalmology;  Laterality: Left;   CATARACT EXTRACTION W/PHACO Right 11/14/2021   Procedure: CATARACT EXTRACTION PHACO AND INTRAOCULAR LENS PLACEMENT ([facility]) RIGHT 8.59 01:10.5;  Surgeon: Leandrew Koyanagi, MD;  Location: [place];  Service: Ophthalmology;  Laterality: Right;   INGUINAL HERNIA REPAIR Right 02/04/2017   Procedure: HERNIA REPAIR INGUINAL ADULT;  Surgeon: Leonie Green, MD;  Location: [facility];  Service: General;  Laterality: Right;   NO PAST SURGERIES     QUADRICEPS TENDON REPAIR Left 02/15/2020   Procedure: REPAIR QUADRICEP TENDON;  Surgeon: Corky Mull, MD;  Location: [facility];  Service: Orthopedics;  Laterality: Left;   Family History  Problem Relation Age of Onset   Diabetes Mother    Diabetes Brother    Diabetes Maternal Grandmother    Diabetes Paternal Grandmother    Social History   Tobacco Use   Smoking status: Former    Packs/day: 0.25    Years: 53.00    Total pack years: 13.25    Types: Cigarettes    Quit date: 02/15/2015    Years since quitting: 7.8   Smokeless tobacco: Never  Substance Use Topics   Alcohol use: Yes    Comment: occassional   No Known Allergies  Prior to Admission medications   Medication Sig Start Date End Date Taking? Authorizing Provider  aspirin EC 81 MG tablet Take 1 tablet (81 mg total) by mouth daily. Swallow whole. 03/28/22  Yes  Darylene Price A, FNP  calcitRIOL (ROCALTROL) 0.25 MCG capsule Take 0.25 mcg by mouth daily.   Yes [provider]  gabapentin (NEURONTIN) 100 MG capsule Take 1 capsule (100 mg total) by mouth 2 (two) times daily. 10/15/22  Yes [place] Eric J, DO  ipratropium-albuterol (DUONEB) 0.5-2.5 (3) MG/3ML SOLN Inhale 3 mLs into the lungs every 6 (six) hours as needed (wheezing).   Yes [provider]  metolazone (ZAROXOLYN) 2.5 MG  tablet Take 1 tablet (2.5 mg total) by mouth 2 (two) times a week. 10/15/22  Yes [place] Eric J, DO  midodrine (PROAMATINE) 5 MG tablet Take 1 tablet (5 mg total) by mouth 3 (three) times daily with meals. Patient taking differently: Take 2.5 mg by mouth 3 (three) times daily with meals. 10/15/22  Yes [place] Eric J, DO  omeprazole (PRILOSEC) 20 MG capsule Take 20 mg by mouth daily. 11/04/22 11/04/23 Yes [provider]  potassium chloride SA (KLOR-CON M) 20 MEQ tablet Take 1 tablet (20 mEq total) by mouth daily. 11/27/22 01/26/23 Yes Sreenath, Sudheer B, MD  torsemide 60 MG TABS Take 60 mg by mouth daily. Patient taking differently: Take 20 mg by mouth daily. 11/27/22 01/26/23 Yes Sreenath, Sudheer B, MD  lactulose (CHRONULAC) 10 GM/15ML solution Take 15 mLs (10 g total) by mouth daily as needed for mild constipation. Patient not taking: Reported on 12/18/2022 11/27/22   Sidney Ace, MD   Review of Systems  Constitutional:  Positive for fatigue (easily). Negative for appetite change.  HENT:  Negative for congestion, postnasal drip and sore throat.   Eyes: Negative.   Respiratory:  Negative for cough, shortness of breath and wheezing.   Cardiovascular:  Positive for leg swelling. Negative for chest pain and palpitations.  Gastrointestinal:  Negative for abdominal distention and abdominal pain.  Endocrine: Negative.   Genitourinary: Negative.   Musculoskeletal:  Positive for arthralgias (left foot pain at times). Negative for back pain and neck pain.  Skin: Negative.   Allergic/Immunologic: Negative.   Neurological:  Positive for weakness (in calves). Negative for dizziness and light-headedness.  Hematological:  Negative for adenopathy. Does not bruise/bleed easily.  Psychiatric/Behavioral:  Positive for sleep disturbance (chronic; sleeping on 2 pillows). Negative for dysphoric mood. The patient is not nervous/anxious.    Vitals:   12/18/22 1349  BP: 118/84  Pulse: 99   Resp: 20  SpO2: 91%  Weight: 200 lb 8 oz (90.9 kg)   Wt Readings from Last 3 Encounters:  12/18/22 200 lb 8 oz (90.9 kg)  11/27/22 201 lb 11.2 oz (91.5 kg)  11/20/22 229 lb (103.9 kg)   Lab Results  Component Value Date   CREATININE 2.66 (H) 12/18/2022   CREATININE 2.24 (H) 11/27/2022   CREATININE 2.28 (H) 11/26/2022   Physical Exam Vitals and nursing note reviewed.  Constitutional:      Appearance: Normal appearance.  HENT:     Head: Normocephalic and atraumatic.  Cardiovascular:     Rate and Rhythm: Normal rate and regular rhythm.  Pulmonary:     Effort: Pulmonary effort is normal. No respiratory distress.     Breath sounds: No wheezing or rales.  Abdominal:     General: There is no distension.     Palpations: Abdomen is soft.  Musculoskeletal:        General: No tenderness.     Cervical back: Normal range of motion and neck supple.     Right lower leg: Edema (1+ pitting) present.  Left lower leg: Edema (1+ pitting) present.  Skin:    General: Skin is warm and dry.  Neurological:     General: No focal deficit present.     Mental Status: He is alert and oriented to person, place, and time.  Psychiatric:        Mood and Affect: Mood normal.        Behavior: Behavior normal.        Thought Content: Thought content normal.   Assessment & Plan:  1: Chronic heart failure with preserved ejection fraction with structural changes (LAE)- - NYHA class III - euvolemic  - weighing daily and understands to call for an overnight weight gain of > 2 pounds or a weekly weight gain of > 5 pounds - weight down 29 pounds from last visit here 1 month ago - not adding salt to his food  - has fluid restriction of 32 ounces from nephrology; drinking ~ 20 ounces of fluid daily but unable to quantify with certainty how much he is drinking - BNP 11/20/22 was 2538.1  2: HTN with CKD- - BP 118/84 - saw PCP Vidal Schwalbe) 10/30/22 - saw nephrology Holley Raring) 10/01/22  - BMP 11/27/22 reviewed  and showed sodium 140, potassium 3.5, creatinine 2.24 and GFR 29 - recheck BMP today; consider jardiance although patient voices concern about adding another medication because he already "takes so many"  3: COPD- - has oxygen at home that he wears at 2L PRN - quit smoking ~ 10 years ago  4: Lymphedema- - stage 2 - instructed to get compression socks and put them on every morning with removal at bedtime - elevate legs when sitting for long periods of time - limited in his ability to exercise due to fatigue - consider compression boots if edema persists   Medication bottles reviewed.   Return in 3 weeks to discuss jardiance use again. Sooner if needed.

## 2022-12-18 NOTE — Telephone Encounter (Signed)
Spoke with patient regarding BMP results obtained earlier today. Potassium is slightly low at 3.1. He's currently taking 16mq potassium once daily with '20mg'$  torsemide daily and metolazone 2.'5mg'$  twice/ week.   Advised patient to increase his potassium to 2 tablets daily. Will recheck his lab work at his next visit in 3 weeks. He verbalized understanding.

## 2022-12-18 NOTE — Patient Instructions (Addendum)
Continue weighing daily and call for an overnight weight gain of 3 pounds or more or a weekly weight gain of more than 5 pounds.   If you have voicemail, please make sure your mailbox is cleaned out so that we may leave a message and please make sure to listen to any voicemails.    Get compression socks at the pharmacy and put them on every morning with removal at bedtime. We can discuss jardiance at your next visit.

## 2022-12-20 ENCOUNTER — Telehealth: Payer: Self-pay

## 2022-12-20 NOTE — Telephone Encounter (Signed)
Patient called requesting refill of potassium. Informed patient that potassium Rx was sent on 12/18/22 and has 5 refills. Confirmed pharmacy location with patient. Patient verbalized understanding and states he will call his pharmacy.

## 2023-01-05 NOTE — Progress Notes (Signed)
Patient ID: John [name], male    DOB: 22-Oct-1946, 77 y.o.   MRN: [id number]   John [name] is a 77 y/o male with a history of HTN, CKD, COPD, hyperlipidemia, remote tobacco use and chronic heart failure.   Echo report from 10/02/22 showed an EF of 50-55% along with mild LVH, moderately enlarged right ventricle, mild LAE, moderate RAE and mild/ moderate TR. Echo report from 01/29/22 reviewed and showed an EF of 55-60% along with moderately elevated PA pressure and mild/moderate TR.   Admitted 12/10/22 due to acute on chronic HF. Initially placed on lasix drip. Cardiology consult obtained. Discharged after 7 days. Admitted 10/02/22 due to progressive lower extremity edema and weight gain. Initially given IV lasix. Found to be hypoxic needing O2 at 2L. Nephrology consult obtained. Transitioned to lasix drip and then able to transition to oral diuretics. Metolazone twice weekly started. Losartan stopped. Antibiotics given for right hand cellulitis. PT/OT evaluations done. Discharged after 13 days.   He presents today for a follow-up visit with a chief complaint of minimal fatigue upon moderate exertion. Describes this as chronic in nature. Has associated pedal edema, calf weakness, difficulty sleeping (chronically) and slight weight gain along with this. Denies any abdominal distention, palpitations, chest pain, wheezing, shortness of breath, cough or dizziness.   Has been taking 2 potassium tablets daily. Home weight has been 198-201 pounds.   Past Medical History:  Diagnosis Date   CHF (congestive heart failure) (HCC)    EF 45% in 2021   Chronic kidney disease    COPD (chronic obstructive pulmonary disease) ([place]) 02/16/2020   Hyperlipidemia    Hypertension    Pneumonia 2021    Past Surgical History:  Procedure Laterality Date   CATARACT EXTRACTION W/PHACO Left 10/31/2021   Procedure: CATARACT EXTRACTION PHACO AND INTRAOCULAR LENS PLACEMENT ([place]) LEFT VISION BLUE 3.45 00:48.0;  Surgeon:  Leandrew Koyanagi, MD;  Location: [place];  Service: Ophthalmology;  Laterality: Left;   CATARACT EXTRACTION W/PHACO Right 11/14/2021   Procedure: CATARACT EXTRACTION PHACO AND INTRAOCULAR LENS PLACEMENT ([facility]) RIGHT 8.59 01:10.5;  Surgeon: Leandrew Koyanagi, MD;  Location: [place];  Service: Ophthalmology;  Laterality: Right;   INGUINAL HERNIA REPAIR Right 02/04/2017   Procedure: HERNIA REPAIR INGUINAL ADULT;  Surgeon: Leonie Green, MD;  Location: [facility];  Service: General;  Laterality: Right;   NO PAST SURGERIES     QUADRICEPS TENDON REPAIR Left 02/15/2020   Procedure: REPAIR QUADRICEP TENDON;  Surgeon: Corky Mull, MD;  Location: [facility];  Service: Orthopedics;  Laterality: Left;   Family History  Problem Relation Age of Onset   Diabetes Mother    Diabetes Brother    Diabetes Maternal Grandmother    Diabetes Paternal Grandmother    Social History   Tobacco Use   Smoking status: Former    Packs/day: 0.25    Years: 53.00    Total pack years: 13.25    Types: Cigarettes    Quit date: 02/15/2015    Years since quitting: 7.8   Smokeless tobacco: Never  Substance Use Topics   Alcohol use: Yes    Comment: occassional   No Known Allergies  Prior to Admission medications   Medication Sig Start Date End Date Taking? Authorizing Provider  aspirin EC 81 MG tablet Take 1 tablet (81 mg total) by mouth daily. Swallow whole. 03/28/22  Yes Anthea Udovich, Otila Kluver A, FNP  calcitRIOL (ROCALTROL) 0.25 MCG capsule Take 0.25 mcg by mouth daily.   Yes [provider]  gabapentin (NEURONTIN) 100 MG capsule Take 1 capsule (100 mg total) by mouth 2 (two) times daily. 10/15/22  Yes [place] Eric J, DO  ipratropium-albuterol (DUONEB) 0.5-2.5 (3) MG/3ML SOLN Inhale 3 mLs into the lungs every 6 (six) hours as needed (wheezing).   Yes [provider]  losartan (COZAAR) 50 MG tablet Take 50 mg by mouth daily. 12/12/22  Yes [provider]  metolazone  (ZAROXOLYN) 2.5 MG tablet Take 1 tablet (2.5 mg total) by mouth 2 (two) times a week. 10/15/22  Yes [place] Eric J, DO  midodrine (PROAMATINE) 5 MG tablet Take 1 tablet (5 mg total) by mouth 3 (three) times daily with meals. Patient taking differently: Take 2.5 mg by mouth 3 (three) times daily with meals. 10/15/22  Yes [place] J, DO  omeprazole (PRILOSEC) 20 MG capsule Take 20 mg by mouth daily. 11/04/22 11/04/23 Yes [provider]  potassium chloride SA (KLOR-CON M) 20 MEQ tablet Take 2 tablets (40 mEq total) by mouth daily. 12/18/22  Yes Darylene Price A, FNP  torsemide 60 MG TABS Take 60 mg by mouth daily. 11/27/22 01/26/23 Yes Sidney Ace, MD   Review of Systems  Constitutional:  Positive for fatigue. Negative for appetite change.  HENT:  Negative for congestion, postnasal drip and sore throat.   Eyes: Negative.   Respiratory:  Negative for cough, shortness of breath and wheezing.   Cardiovascular:  Positive for leg swelling. Negative for chest pain and palpitations.  Gastrointestinal:  Negative for abdominal distention and abdominal pain.  Endocrine: Negative.   Genitourinary: Negative.   Musculoskeletal:  Positive for arthralgias (left foot pain at times). Negative for back pain and neck pain.  Skin: Negative.   Allergic/Immunologic: Negative.   Neurological:  Positive for weakness (in calves). Negative for dizziness and light-headedness.  Hematological:  Negative for adenopathy. Does not bruise/bleed easily.  Psychiatric/Behavioral:  Positive for sleep disturbance (chronic; sleeping on 2 pillows). Negative for dysphoric mood. The patient is not nervous/anxious.    Vitals:   01/06/23 0934  BP: 120/88  Pulse: 85  Resp: 14  SpO2: 100%  Weight: 206 lb 6 oz (93.6 kg)  Height: '5\' 10"'$  (1.778 m)   Wt Readings from Last 3 Encounters:  01/06/23 206 lb 6 oz (93.6 kg)  12/18/22 200 lb 8 oz (90.9 kg)  11/27/22 201 lb 11.2 oz (91.5 kg)   Lab Results  Component  Value Date   CREATININE 2.67 (H) 01/06/2023   CREATININE 2.66 (H) 12/18/2022   CREATININE 2.24 (H) 11/27/2022   Physical Exam Vitals and nursing note reviewed.  Constitutional:      Appearance: Normal appearance.  HENT:     Head: Normocephalic and atraumatic.  Cardiovascular:     Rate and Rhythm: Normal rate and regular rhythm.  Pulmonary:     Effort: Pulmonary effort is normal. No respiratory distress.     Breath sounds: No wheezing or rales.  Abdominal:     General: There is no distension.     Palpations: Abdomen is soft.  Musculoskeletal:        General: No tenderness.     Cervical back: Normal range of motion and neck supple.     Right lower leg: Edema (1+ pitting) present.     Left lower leg: Edema (1+ pitting) present.  Skin:    General: Skin is warm and dry.  Neurological:     General: No focal deficit present.     Mental Status: He is alert  and oriented to person, place, and time.  Psychiatric:        Mood and Affect: Mood normal.        Behavior: Behavior normal.        Thought Content: Thought content normal.   Assessment & Plan:  1: Chronic heart failure with preserved ejection fraction with structural changes (LAE)- - NYHA class II - euvolemic  - weighing daily & home weight has ranged from 198-201 pounds; understands to call for an overnight weight gain of > 2 pounds or a weekly weight gain of > 5 pounds - weight up 6 pounds from last visit here 3 weeks ago - not adding salt to his food  - has fluid restriction of 32 ounces from nephrology; still unable to quantify exactly how much he's drinking - consider SGLT2 if GFR remains >20 - BNP 11/20/22 was 2538.1  2: HTN with CKD- - BP 120/88 - saw PCP Vidal Schwalbe) 10/30/22 - saw nephrology Holley Raring) 10/01/22  - BMP 12/18/22 reviewed and showed sodium 140, potassium 3.1, creatinine 2.66 and GFR 24 - recheck BMP today as he's now taking 2 potassium daily; will call after getting results regarding his potassium  usage  3: COPD- - has oxygen at home that he wears at 2L PRN - quit smoking ~ 10 years ago  4: Lymphedema- - stage 2 - encouraged to get compression socks and put them on every day with removal at bedtime - elevate legs when sitting for long periods of time - limited in his ability to exercise due to fatigue - consider compression boots if edema persists   Medication list reviewed.   Return in 3 weeks, sooner if needed.

## 2023-01-06 ENCOUNTER — Other Ambulatory Visit
Admission: RE | Admit: 2023-01-06 | Discharge: 2023-01-06 | Disposition: A | Discharge status: Home / Self Care | Payer: Medicare HMO | Source: Ambulatory Visit | Attending: Family | Admitting: Family

## 2023-01-06 ENCOUNTER — Telehealth: Payer: Self-pay

## 2023-01-06 ENCOUNTER — Ambulatory Visit (HOSPITAL_BASED_OUTPATIENT_CLINIC_OR_DEPARTMENT_OTHER): Payer: Medicare HMO | Admitting: Family

## 2023-01-06 ENCOUNTER — Encounter: Payer: Self-pay | Admitting: Family

## 2023-01-06 VITALS — BP 120/88 | HR 85 | Resp 14 | Ht 70.0 in | Wt 206.4 lb

## 2023-01-06 DIAGNOSIS — I10 Essential (primary) hypertension: Secondary | ICD-10-CM

## 2023-01-06 DIAGNOSIS — J449 Chronic obstructive pulmonary disease, unspecified: Secondary | ICD-10-CM

## 2023-01-06 DIAGNOSIS — I890 Lymphedema, not elsewhere classified: Secondary | ICD-10-CM | POA: Diagnosis not present

## 2023-01-06 DIAGNOSIS — I5032 Chronic diastolic (congestive) heart failure: Secondary | ICD-10-CM

## 2023-01-06 LAB — BASIC METABOLIC PANEL
Anion gap: 12 (ref 5–15)
BUN: 54 mg/dL — ABNORMAL HIGH (ref 8–23)
CO2: 35 mmol/L — ABNORMAL HIGH (ref 22–32)
Calcium: 9.7 mg/dL (ref 8.9–10.3)
Chloride: 94 mmol/L — ABNORMAL LOW (ref 98–111)
Creatinine, Ser: 2.67 mg/dL — ABNORMAL HIGH (ref 0.61–1.24)
GFR, Estimated: 24 mL/min — ABNORMAL LOW (ref >60)
Glucose, Bld: 92 mg/dL (ref 70–99)
Potassium: 4.5 mmol/L (ref 3.5–5.1)
Sodium: 141 mmol/L (ref 135–145)

## 2023-01-06 NOTE — Patient Instructions (Signed)
Continue weighing daily and call for an overnight weight gain of 3 pounds or more or a weekly weight gain of more than 5 pounds.   If you have voicemail, please make sure your mailbox is cleaned out so that we may leave a message and please make sure to listen to any voicemails.     

## 2023-01-06 NOTE — Telephone Encounter (Addendum)
Patient verbalized understanding of results with medication changes. States back that he will take one potassium tablet daily except for Monday and Friday, which he will increase to two potassium tablets when he takes an extra fluid tablet. Pt had no questions or concerns at this time.  ----- Message from Alisa Graff, FNP sent at 01/06/2023 11:35 AM EST ----- Kidney function is stable. Potassium level much improved. Reduce daily potassium back to 1 every day except on M & F when you take the extra fluid pill, take an additional potassium tablet on those days. Will recheck labs at next visit.

## 2023-01-15 DIAGNOSIS — M1711 Unilateral primary osteoarthritis, right knee: Secondary | ICD-10-CM | POA: Diagnosis not present

## 2023-01-23 DIAGNOSIS — N189 Chronic kidney disease, unspecified: Secondary | ICD-10-CM | POA: Diagnosis not present

## 2023-01-23 DIAGNOSIS — I130 Hypertensive heart and chronic kidney disease with heart failure and stage 1 through stage 4 chronic kidney disease, or unspecified chronic kidney disease: Secondary | ICD-10-CM | POA: Diagnosis not present

## 2023-01-23 DIAGNOSIS — S80822A Blister (nonthermal), left lower leg, initial encounter: Secondary | ICD-10-CM | POA: Diagnosis not present

## 2023-01-23 DIAGNOSIS — I5032 Chronic diastolic (congestive) heart failure: Secondary | ICD-10-CM | POA: Diagnosis not present

## 2023-01-23 DIAGNOSIS — Z87891 Personal history of nicotine dependence: Secondary | ICD-10-CM | POA: Diagnosis not present

## 2023-01-27 NOTE — Progress Notes (Unsigned)
Patient ID: John [name], male    DOB: 1946/06/24, 77 y.o.   MRN: [id number]   John [name] is a 77 y/o male with a history of HTN, CKD, COPD, hyperlipidemia, remote tobacco use and chronic heart failure.   Echo report from 10/02/22 showed an EF of 50-55% along with mild LVH, moderately enlarged right ventricle, mild LAE, moderate RAE and mild/ moderate TR. Echo report from 01/29/22 reviewed and showed an EF of 55-60% along with moderately elevated PA pressure and mild/moderate TR.   Admitted 12/10/22 due to acute on chronic HF. Initially placed on lasix drip. Cardiology consult obtained. Discharged after 7 days. Admitted 10/02/22 due to progressive lower extremity edema and weight gain. Initially given IV lasix. Found to be hypoxic needing O2 at 2L. Nephrology consult obtained. Transitioned to lasix drip and then able to transition to oral diuretics. Metolazone twice weekly started. Losartan stopped. Antibiotics given for right hand cellulitis. PT/OT evaluations done. Discharged after 13 days.   He presents today for a follow-up visit with a chief complaint of minimal SOB upon exertion. Describes this as chronic in nature. Has associated fatigue, pedal edema (worsening), leg weakness, difficulty sleeping, left lower leg wound and gradual weight gain along with this. Denies any abdominal distention, palpitations, chest pain, wheezing, cough or dizziness.   Taking 1 potassium daily except on metolazone days (M & F), he takes an extra one. Voices frustration that he now has a "large blister" that his PCP has bandaged/ wrapped and he feels like every day, something worse is happening in regards to his health.    Past Medical History:  Diagnosis Date   CHF (congestive heart failure) (HCC)    EF 45% in 2021   Chronic kidney disease    COPD (chronic obstructive pulmonary disease) ([facility]) 02/16/2020   Hyperlipidemia    Hypertension    Pneumonia 2021    Past Surgical History:  Procedure Laterality  Date   CATARACT EXTRACTION W/PHACO Left 10/31/2021   Procedure: CATARACT EXTRACTION PHACO AND INTRAOCULAR LENS PLACEMENT ([facility]) LEFT VISION BLUE 3.45 00:48.0;  Surgeon: Leandrew Koyanagi, MD;  Location: [facility];  Service: Ophthalmology;  Laterality: Left;   CATARACT EXTRACTION W/PHACO Right 11/14/2021   Procedure: CATARACT EXTRACTION PHACO AND INTRAOCULAR LENS PLACEMENT ([facility]) RIGHT 8.59 01:10.5;  Surgeon: Leandrew Koyanagi, MD;  Location: [facility];  Service: Ophthalmology;  Laterality: Right;   INGUINAL HERNIA REPAIR Right 02/04/2017   Procedure: HERNIA REPAIR INGUINAL ADULT;  Surgeon: Leonie Green, MD;  Location: [facility];  Service: General;  Laterality: Right;   NO PAST SURGERIES     QUADRICEPS TENDON REPAIR Left 02/15/2020   Procedure: REPAIR QUADRICEP TENDON;  Surgeon: Corky Mull, MD;  Location: [facility];  Service: Orthopedics;  Laterality: Left;   Family History  Problem Relation Age of Onset   Diabetes Mother    Diabetes Brother    Diabetes Maternal Grandmother    Diabetes Paternal Grandmother    Social History   Tobacco Use   Smoking status: Former    Packs/day: 0.25    Years: 53.00    Total pack years: 13.25    Types: Cigarettes    Quit date: 02/15/2015    Years since quitting: 7.9   Smokeless tobacco: Never  Substance Use Topics   Alcohol use: Yes    Comment: occassional   No Known Allergies  Prior to Admission medications   Medication Sig Start Date End Date Taking? Authorizing Provider  aspirin EC 81 MG tablet  Take 1 tablet (81 mg total) by mouth daily. Swallow whole. 03/28/22  Yes Neeraj Housand, Otila Kluver A, FNP  calcitRIOL (ROCALTROL) 0.25 MCG capsule Take 0.25 mcg by mouth daily.   Yes [provider]  cephALEXin (KEFLEX) 500 MG capsule Take 500 mg by mouth 3 (three) times daily.   Yes [provider]  gabapentin (NEURONTIN) 100 MG capsule Take 1 capsule (100 mg total) by mouth 2 (two) times daily. 10/15/22  Yes [place]  Eric J, DO  ipratropium-albuterol (DUONEB) 0.5-2.5 (3) MG/3ML SOLN Inhale 3 mLs into the lungs every 6 (six) hours as needed (wheezing).   Yes [provider]  losartan (COZAAR) 50 MG tablet Take 50 mg by mouth daily. 12/12/22  Yes [provider]  metolazone (ZAROXOLYN) 2.5 MG tablet Take 1 tablet (2.5 mg total) by mouth 2 (two) times a week. 10/15/22  Yes [place] Eric J, DO  midodrine (PROAMATINE) 5 MG tablet Take 1 tablet (5 mg total) by mouth 3 (three) times daily with meals. Patient taking differently: Take 2.5 mg by mouth 3 (three) times daily with meals. 10/15/22  Yes [place] Eric J, DO  omeprazole (PRILOSEC) 20 MG capsule Take 20 mg by mouth daily. 11/04/22 11/04/23 Yes [provider]  torsemide 60 MG TABS Take 60 mg by mouth daily. 11/27/22  Yes Sreenath, Sudheer B, MD  potassium chloride SA (KLOR-CON M) 20 MEQ tablet Take 1 tablet (20 mEq total) by mouth daily. With extra 8mq on M/ F with metolazone 01/28/23   HAlisa Graff FNP    Review of Systems  Constitutional:  Positive for fatigue. Negative for appetite change.  HENT:  Negative for congestion, postnasal drip and sore throat.   Eyes: Negative.   Respiratory:  Positive for shortness of breath. Negative for cough and wheezing.   Cardiovascular:  Positive for leg swelling. Negative for chest pain and palpitations.  Gastrointestinal:  Negative for abdominal distention and abdominal pain.  Endocrine: Negative.   Genitourinary: Negative.   Musculoskeletal:  Positive for arthralgias (left foot pain at times). Negative for back pain and neck pain.  Skin:  Positive for wound (blister on left lower leg; currently bandaged).  Allergic/Immunologic: Negative.   Neurological:  Positive for weakness (in calves). Negative for dizziness and light-headedness.  Hematological:  Negative for adenopathy. Does not bruise/bleed easily.  Psychiatric/Behavioral:  Positive for sleep disturbance (chronic; sleeping on 2  pillows). Negative for dysphoric mood. The patient is not nervous/anxious.    Vitals:   01/28/23 0902 01/28/23 0925  BP: (!) 156/138 (!) 150/110  Pulse: 87   Resp: 18   SpO2: 94%   Weight: 218 lb (98.9 kg)    Wt Readings from Last 3 Encounters:  01/28/23 218 lb (98.9 kg)  01/06/23 206 lb 6 oz (93.6 kg)  12/18/22 200 lb 8 oz (90.9 kg)   Lab Results  Component Value Date   CREATININE 2.67 (H) 01/06/2023   CREATININE 2.66 (H) 12/18/2022   CREATININE 2.24 (H) 11/27/2022   Physical Exam Vitals and nursing note reviewed.  Constitutional:      Appearance: Normal appearance.  HENT:     Head: Normocephalic and atraumatic.  Cardiovascular:     Rate and Rhythm: Normal rate and regular rhythm.  Pulmonary:     Effort: Pulmonary effort is normal. No respiratory distress.     Breath sounds: No wheezing or rales.  Abdominal:     General: There is no distension.     Palpations: Abdomen is soft.  Musculoskeletal:  General: No tenderness.     Cervical back: Normal range of motion and neck supple.     Right lower leg: Edema (2+ pitting) present.     Left lower leg: Edema (2+ pitting) present.  Skin:    General: Skin is warm and dry.     Comments: Left lower leg bandaged  Neurological:     General: No focal deficit present.     Mental Status: He is alert and oriented to person, place, and time.  Psychiatric:        Mood and Affect: Mood normal.        Behavior: Behavior normal.        Thought Content: Thought content normal.   Assessment & Plan:  1: Acute on Chronic heart failure with preserved ejection fraction with structural changes (LAE)- - NYHA class II - fluid overloaded with weight gain, worsening edema and elevated ReDs reading - weighing daily & reports a gradual weight gain but he's unable to state what his weight gain has been like; reminded to call for an overnight weight gain of > 2 pounds or a weekly weight gain of > 5 pounds - weight up 12 pounds from last  visit here 3 weeks ago - ReDs clip reading today was 39% - not adding salt to his food  - has fluid restriction of 32 ounces from nephrology; still unable to quantify exactly how much he's drinking - consider SGLT2 if GFR remains >20 - will check BMP today and will call him with results as he will probably need more metolazone and then consider adding SGLT2 next week - BNP 11/20/22 was 2538.1  2: HTN with CKD- - BP 150/110 with recheck with manual cuff - checking BMP today and then will adjust metolazone - saw PCP Vidal Schwalbe) 10/30/22 - saw nephrology Holley Raring) 10/01/22  - BMP 01/06/23 reviewed and showed sodium 141, potassium 4.5, creatinine 2.67 and GFR 24  3: COPD- - has oxygen at home that he wears at 2L PRN - quit smoking ~ 10 years ago  4: Lymphedema- - stage 2 - encouraged to get compression socks and put them on every day with removal at bedtime - elevate legs when sitting for long periods of time - limited in his ability to exercise due to fatigue - consider compression boots if edema persists   Patient did not bring his medications nor a list. Each medication was verbally reviewed with the patient and he was encouraged to bring the bottles to every visit to confirm accuracy of list.  Return in 1 week, sooner if needed.

## 2023-01-28 ENCOUNTER — Telehealth: Payer: Self-pay | Admitting: Family

## 2023-01-28 ENCOUNTER — Ambulatory Visit (HOSPITAL_BASED_OUTPATIENT_CLINIC_OR_DEPARTMENT_OTHER): Payer: Medicare HMO | Admitting: Family

## 2023-01-28 ENCOUNTER — Other Ambulatory Visit
Admission: RE | Admit: 2023-01-28 | Discharge: 2023-01-28 | Disposition: A | Discharge status: Home / Self Care | Payer: Medicare HMO | Source: Ambulatory Visit | Attending: Family | Admitting: Family

## 2023-01-28 ENCOUNTER — Encounter: Payer: Self-pay | Admitting: Family

## 2023-01-28 VITALS — BP 150/110 | HR 87 | Resp 18 | Wt 218.0 lb

## 2023-01-28 DIAGNOSIS — D689 Coagulation defect, unspecified: Secondary | ICD-10-CM | POA: Diagnosis present

## 2023-01-28 DIAGNOSIS — R609 Edema, unspecified: Secondary | ICD-10-CM | POA: Insufficient documentation

## 2023-01-28 DIAGNOSIS — N189 Chronic kidney disease, unspecified: Secondary | ICD-10-CM | POA: Diagnosis not present

## 2023-01-28 DIAGNOSIS — I5043 Acute on chronic combined systolic (congestive) and diastolic (congestive) heart failure: Secondary | ICD-10-CM | POA: Diagnosis present

## 2023-01-28 DIAGNOSIS — I5041 Acute combined systolic (congestive) and diastolic (congestive) heart failure: Secondary | ICD-10-CM | POA: Diagnosis not present

## 2023-01-28 DIAGNOSIS — R0902 Hypoxemia: Secondary | ICD-10-CM | POA: Diagnosis not present

## 2023-01-28 DIAGNOSIS — I509 Heart failure, unspecified: Secondary | ICD-10-CM | POA: Diagnosis not present

## 2023-01-28 DIAGNOSIS — U071 COVID-19: Secondary | ICD-10-CM | POA: Diagnosis not present

## 2023-01-28 DIAGNOSIS — I5033 Acute on chronic diastolic (congestive) heart failure: Secondary | ICD-10-CM | POA: Diagnosis not present

## 2023-01-28 DIAGNOSIS — I259 Chronic ischemic heart disease, unspecified: Secondary | ICD-10-CM | POA: Diagnosis not present

## 2023-01-28 DIAGNOSIS — R0602 Shortness of breath: Secondary | ICD-10-CM | POA: Insufficient documentation

## 2023-01-28 DIAGNOSIS — K921 Melena: Secondary | ICD-10-CM | POA: Diagnosis not present

## 2023-01-28 DIAGNOSIS — I5023 Acute on chronic systolic (congestive) heart failure: Secondary | ICD-10-CM | POA: Diagnosis not present

## 2023-01-28 DIAGNOSIS — R570 Cardiogenic shock: Secondary | ICD-10-CM | POA: Diagnosis present

## 2023-01-28 DIAGNOSIS — Z7189 Other specified counseling: Secondary | ICD-10-CM | POA: Diagnosis not present

## 2023-01-28 DIAGNOSIS — E1122 Type 2 diabetes mellitus with diabetic chronic kidney disease: Secondary | ICD-10-CM | POA: Diagnosis present

## 2023-01-28 DIAGNOSIS — I714 Abdominal aortic aneurysm, without rupture, unspecified: Secondary | ICD-10-CM | POA: Diagnosis present

## 2023-01-28 DIAGNOSIS — N186 End stage renal disease: Secondary | ICD-10-CM | POA: Diagnosis present

## 2023-01-28 DIAGNOSIS — R4182 Altered mental status, unspecified: Secondary | ICD-10-CM | POA: Diagnosis not present

## 2023-01-28 DIAGNOSIS — R40243 Glasgow coma scale score 3-8, unspecified time: Secondary | ICD-10-CM | POA: Diagnosis not present

## 2023-01-28 DIAGNOSIS — E872 Acidosis, unspecified: Secondary | ICD-10-CM | POA: Diagnosis present

## 2023-01-28 DIAGNOSIS — Z515 Encounter for palliative care: Secondary | ICD-10-CM | POA: Diagnosis not present

## 2023-01-28 DIAGNOSIS — E785 Hyperlipidemia, unspecified: Secondary | ICD-10-CM | POA: Insufficient documentation

## 2023-01-28 DIAGNOSIS — J449 Chronic obstructive pulmonary disease, unspecified: Secondary | ICD-10-CM

## 2023-01-28 DIAGNOSIS — M25531 Pain in right wrist: Secondary | ICD-10-CM | POA: Diagnosis not present

## 2023-01-28 DIAGNOSIS — I2729 Other secondary pulmonary hypertension: Secondary | ICD-10-CM | POA: Diagnosis present

## 2023-01-28 DIAGNOSIS — R34 Anuria and oliguria: Secondary | ICD-10-CM | POA: Diagnosis not present

## 2023-01-28 DIAGNOSIS — Z87891 Personal history of nicotine dependence: Secondary | ICD-10-CM | POA: Insufficient documentation

## 2023-01-28 DIAGNOSIS — R531 Weakness: Secondary | ICD-10-CM | POA: Insufficient documentation

## 2023-01-28 DIAGNOSIS — K922 Gastrointestinal hemorrhage, unspecified: Secondary | ICD-10-CM | POA: Diagnosis not present

## 2023-01-28 DIAGNOSIS — R0600 Dyspnea, unspecified: Secondary | ICD-10-CM | POA: Diagnosis not present

## 2023-01-28 DIAGNOSIS — Z452 Encounter for adjustment and management of vascular access device: Secondary | ICD-10-CM | POA: Diagnosis not present

## 2023-01-28 DIAGNOSIS — Z7401 Bed confinement status: Secondary | ICD-10-CM | POA: Diagnosis not present

## 2023-01-28 DIAGNOSIS — K260 Acute duodenal ulcer with hemorrhage: Secondary | ICD-10-CM | POA: Diagnosis not present

## 2023-01-28 DIAGNOSIS — I10 Essential (primary) hypertension: Secondary | ICD-10-CM | POA: Diagnosis not present

## 2023-01-28 DIAGNOSIS — R1110 Vomiting, unspecified: Secondary | ICD-10-CM | POA: Diagnosis not present

## 2023-01-28 DIAGNOSIS — I351 Nonrheumatic aortic (valve) insufficiency: Secondary | ICD-10-CM | POA: Diagnosis present

## 2023-01-28 DIAGNOSIS — I890 Lymphedema, not elsewhere classified: Secondary | ICD-10-CM | POA: Diagnosis not present

## 2023-01-28 DIAGNOSIS — J9600 Acute respiratory failure, unspecified whether with hypoxia or hypercapnia: Secondary | ICD-10-CM | POA: Diagnosis not present

## 2023-01-28 DIAGNOSIS — Z1152 Encounter for screening for COVID-19: Secondary | ICD-10-CM | POA: Diagnosis not present

## 2023-01-28 DIAGNOSIS — Z66 Do not resuscitate: Secondary | ICD-10-CM | POA: Diagnosis present

## 2023-01-28 DIAGNOSIS — Z4682 Encounter for fitting and adjustment of non-vascular catheter: Secondary | ICD-10-CM | POA: Diagnosis not present

## 2023-01-28 DIAGNOSIS — I130 Hypertensive heart and chronic kidney disease with heart failure and stage 1 through stage 4 chronic kidney disease, or unspecified chronic kidney disease: Secondary | ICD-10-CM | POA: Insufficient documentation

## 2023-01-28 DIAGNOSIS — J9691 Respiratory failure, unspecified with hypoxia: Secondary | ICD-10-CM | POA: Diagnosis not present

## 2023-01-28 DIAGNOSIS — J90 Pleural effusion, not elsewhere classified: Secondary | ICD-10-CM | POA: Diagnosis not present

## 2023-01-28 DIAGNOSIS — J9601 Acute respiratory failure with hypoxia: Secondary | ICD-10-CM | POA: Diagnosis present

## 2023-01-28 DIAGNOSIS — J9690 Respiratory failure, unspecified, unspecified whether with hypoxia or hypercapnia: Secondary | ICD-10-CM | POA: Diagnosis not present

## 2023-01-28 DIAGNOSIS — I959 Hypotension, unspecified: Secondary | ICD-10-CM | POA: Diagnosis not present

## 2023-01-28 DIAGNOSIS — K7460 Unspecified cirrhosis of liver: Secondary | ICD-10-CM | POA: Diagnosis present

## 2023-01-28 DIAGNOSIS — I129 Hypertensive chronic kidney disease with stage 1 through stage 4 chronic kidney disease, or unspecified chronic kidney disease: Secondary | ICD-10-CM | POA: Diagnosis not present

## 2023-01-28 DIAGNOSIS — Z992 Dependence on renal dialysis: Secondary | ICD-10-CM | POA: Diagnosis not present

## 2023-01-28 DIAGNOSIS — I2489 Other forms of acute ischemic heart disease: Secondary | ICD-10-CM | POA: Diagnosis not present

## 2023-01-28 DIAGNOSIS — N184 Chronic kidney disease, stage 4 (severe): Secondary | ICD-10-CM | POA: Diagnosis not present

## 2023-01-28 DIAGNOSIS — R579 Shock, unspecified: Secondary | ICD-10-CM | POA: Diagnosis not present

## 2023-01-28 DIAGNOSIS — I5042 Chronic combined systolic (congestive) and diastolic (congestive) heart failure: Secondary | ICD-10-CM | POA: Diagnosis not present

## 2023-01-28 DIAGNOSIS — K264 Chronic or unspecified duodenal ulcer with hemorrhage: Secondary | ICD-10-CM | POA: Diagnosis not present

## 2023-01-28 DIAGNOSIS — R7989 Other specified abnormal findings of blood chemistry: Secondary | ICD-10-CM | POA: Diagnosis not present

## 2023-01-28 DIAGNOSIS — G928 Other toxic encephalopathy: Secondary | ICD-10-CM | POA: Diagnosis present

## 2023-01-28 DIAGNOSIS — I120 Hypertensive chronic kidney disease with stage 5 chronic kidney disease or end stage renal disease: Secondary | ICD-10-CM | POA: Diagnosis not present

## 2023-01-28 DIAGNOSIS — D62 Acute posthemorrhagic anemia: Secondary | ICD-10-CM | POA: Diagnosis not present

## 2023-01-28 DIAGNOSIS — D500 Iron deficiency anemia secondary to blood loss (chronic): Secondary | ICD-10-CM | POA: Diagnosis not present

## 2023-01-28 DIAGNOSIS — J811 Chronic pulmonary edema: Secondary | ICD-10-CM | POA: Diagnosis not present

## 2023-01-28 DIAGNOSIS — I700 Atherosclerosis of aorta: Secondary | ICD-10-CM | POA: Diagnosis not present

## 2023-01-28 DIAGNOSIS — R571 Hypovolemic shock: Secondary | ICD-10-CM | POA: Diagnosis not present

## 2023-01-28 DIAGNOSIS — R933 Abnormal findings on diagnostic imaging of other parts of digestive tract: Secondary | ICD-10-CM | POA: Diagnosis not present

## 2023-01-28 DIAGNOSIS — N179 Acute kidney failure, unspecified: Secondary | ICD-10-CM | POA: Diagnosis not present

## 2023-01-28 DIAGNOSIS — D631 Anemia in chronic kidney disease: Secondary | ICD-10-CM | POA: Diagnosis present

## 2023-01-28 DIAGNOSIS — I132 Hypertensive heart and chronic kidney disease with heart failure and with stage 5 chronic kidney disease, or end stage renal disease: Secondary | ICD-10-CM | POA: Diagnosis present

## 2023-01-28 LAB — BASIC METABOLIC PANEL
Anion gap: 16 — ABNORMAL HIGH (ref 5–15)
BUN: 133 mg/dL — ABNORMAL HIGH (ref 8–23)
CO2: 31 mmol/L (ref 22–32)
Calcium: 9.9 mg/dL (ref 8.9–10.3)
Chloride: 93 mmol/L — ABNORMAL LOW (ref 98–111)
Creatinine, Ser: 5.41 mg/dL — ABNORMAL HIGH (ref 0.61–1.24)
GFR, Estimated: 10 mL/min — ABNORMAL LOW (ref >60)
Glucose, Bld: 141 mg/dL — ABNORMAL HIGH (ref 70–99)
Potassium: 4.8 mmol/L (ref 3.5–5.1)
Sodium: 140 mmol/L (ref 135–145)

## 2023-01-28 MED ORDER — POTASSIUM CHLORIDE CRYS ER 20 MEQ PO TBCR: 20.0000 meq | EXTENDED_RELEASE_TABLET | Freq: Every day | ORAL | 3 refills | Status: DC

## 2023-01-28 NOTE — Patient Instructions (Addendum)
Continue weighing daily and call for an overnight weight gain of 3 pounds or more or a weekly weight gain of more than 5 pounds.   I will call you when I get your lab results back.

## 2023-01-28 NOTE — Telephone Encounter (Signed)
Received BMP results from earlier today. Creatinine and GFR have both worsened from 3 weeks ago. Creatinine is now 5.41 and was 2.67. GFR is now 10 and previously was 24. At HF appt earlier today, he was HTN with weight gain.   Secure chat sent to nephrology Holley Raring) who recommended patient go to the ER due to cardiorenal syndrome. Called patient who was asleep but did speak with his friend [name] and explained his lab work and that he should go to the ED for further evaluation and treatment.   She verbalized understanding and says that she will wake him and tell him this information. He can call back if he has any questions.

## 2023-01-28 NOTE — Progress Notes (Signed)
ReDS Vest / Clip - 01/28/23 0902       ReDS Vest / Clip   Station Marker C    Ruler Value 24    ReDS Actual Value 39

## 2023-01-29 ENCOUNTER — Inpatient Hospital Stay: Payer: Medicare HMO

## 2023-01-29 ENCOUNTER — Emergency Department: Payer: Medicare HMO

## 2023-01-29 ENCOUNTER — Inpatient Hospital Stay
Admission: EM | Admit: 2023-01-29 | Discharge: 2023-03-04 | DRG: 291 | Disposition: A | Discharge status: Skilled Nursing Facility | Payer: Medicare HMO | Attending: Internal Medicine | Admitting: Internal Medicine

## 2023-01-29 ENCOUNTER — Encounter: Payer: Self-pay | Admitting: Emergency Medicine

## 2023-01-29 ENCOUNTER — Other Ambulatory Visit: Payer: Self-pay

## 2023-01-29 DIAGNOSIS — I509 Heart failure, unspecified: Secondary | ICD-10-CM

## 2023-01-29 DIAGNOSIS — I2489 Other forms of acute ischemic heart disease: Secondary | ICD-10-CM | POA: Diagnosis not present

## 2023-01-29 DIAGNOSIS — R34 Anuria and oliguria: Secondary | ICD-10-CM | POA: Diagnosis not present

## 2023-01-29 DIAGNOSIS — U071 COVID-19: Secondary | ICD-10-CM | POA: Diagnosis not present

## 2023-01-29 DIAGNOSIS — E876 Hypokalemia: Secondary | ICD-10-CM | POA: Diagnosis not present

## 2023-01-29 DIAGNOSIS — Z66 Do not resuscitate: Secondary | ICD-10-CM | POA: Diagnosis present

## 2023-01-29 DIAGNOSIS — N186 End stage renal disease: Secondary | ICD-10-CM | POA: Diagnosis present

## 2023-01-29 DIAGNOSIS — R933 Abnormal findings on diagnostic imaging of other parts of digestive tract: Secondary | ICD-10-CM | POA: Diagnosis not present

## 2023-01-29 DIAGNOSIS — Z832 Family history of diseases of the blood and blood-forming organs and certain disorders involving the immune mechanism: Secondary | ICD-10-CM

## 2023-01-29 DIAGNOSIS — I2729 Other secondary pulmonary hypertension: Secondary | ICD-10-CM | POA: Diagnosis present

## 2023-01-29 DIAGNOSIS — D689 Coagulation defect, unspecified: Secondary | ICD-10-CM | POA: Diagnosis present

## 2023-01-29 DIAGNOSIS — D62 Acute posthemorrhagic anemia: Secondary | ICD-10-CM | POA: Diagnosis not present

## 2023-01-29 DIAGNOSIS — D6959 Other secondary thrombocytopenia: Secondary | ICD-10-CM | POA: Diagnosis present

## 2023-01-29 DIAGNOSIS — K7460 Unspecified cirrhosis of liver: Secondary | ICD-10-CM | POA: Diagnosis present

## 2023-01-29 DIAGNOSIS — I5082 Biventricular heart failure: Secondary | ICD-10-CM | POA: Diagnosis present

## 2023-01-29 DIAGNOSIS — E1122 Type 2 diabetes mellitus with diabetic chronic kidney disease: Secondary | ICD-10-CM | POA: Diagnosis present

## 2023-01-29 DIAGNOSIS — Z1152 Encounter for screening for COVID-19: Secondary | ICD-10-CM | POA: Diagnosis not present

## 2023-01-29 DIAGNOSIS — D72829 Elevated white blood cell count, unspecified: Secondary | ICD-10-CM | POA: Diagnosis not present

## 2023-01-29 DIAGNOSIS — I351 Nonrheumatic aortic (valve) insufficiency: Secondary | ICD-10-CM | POA: Diagnosis present

## 2023-01-29 DIAGNOSIS — J9600 Acute respiratory failure, unspecified whether with hypoxia or hypercapnia: Secondary | ICD-10-CM | POA: Diagnosis not present

## 2023-01-29 DIAGNOSIS — E872 Acidosis, unspecified: Secondary | ICD-10-CM | POA: Diagnosis present

## 2023-01-29 DIAGNOSIS — G928 Other toxic encephalopathy: Secondary | ICD-10-CM | POA: Diagnosis present

## 2023-01-29 DIAGNOSIS — Z7189 Other specified counseling: Secondary | ICD-10-CM | POA: Diagnosis not present

## 2023-01-29 DIAGNOSIS — M25531 Pain in right wrist: Secondary | ICD-10-CM | POA: Diagnosis not present

## 2023-01-29 DIAGNOSIS — Z79899 Other long term (current) drug therapy: Secondary | ICD-10-CM

## 2023-01-29 DIAGNOSIS — I5043 Acute on chronic combined systolic (congestive) and diastolic (congestive) heart failure: Secondary | ICD-10-CM | POA: Diagnosis present

## 2023-01-29 DIAGNOSIS — N184 Chronic kidney disease, stage 4 (severe): Secondary | ICD-10-CM | POA: Diagnosis not present

## 2023-01-29 DIAGNOSIS — J9690 Respiratory failure, unspecified, unspecified whether with hypoxia or hypercapnia: Secondary | ICD-10-CM | POA: Diagnosis not present

## 2023-01-29 DIAGNOSIS — Z833 Family history of diabetes mellitus: Secondary | ICD-10-CM

## 2023-01-29 DIAGNOSIS — R570 Cardiogenic shock: Secondary | ICD-10-CM | POA: Diagnosis present

## 2023-01-29 DIAGNOSIS — Z8249 Family history of ischemic heart disease and other diseases of the circulatory system: Secondary | ICD-10-CM

## 2023-01-29 DIAGNOSIS — E875 Hyperkalemia: Secondary | ICD-10-CM | POA: Diagnosis present

## 2023-01-29 DIAGNOSIS — R1110 Vomiting, unspecified: Secondary | ICD-10-CM | POA: Diagnosis not present

## 2023-01-29 DIAGNOSIS — Z87891 Personal history of nicotine dependence: Secondary | ICD-10-CM

## 2023-01-29 DIAGNOSIS — Z6825 Body mass index (BMI) 25.0-25.9, adult: Secondary | ICD-10-CM

## 2023-01-29 DIAGNOSIS — K264 Chronic or unspecified duodenal ulcer with hemorrhage: Secondary | ICD-10-CM | POA: Diagnosis not present

## 2023-01-29 DIAGNOSIS — R579 Shock, unspecified: Secondary | ICD-10-CM | POA: Diagnosis present

## 2023-01-29 DIAGNOSIS — I5042 Chronic combined systolic (congestive) and diastolic (congestive) heart failure: Secondary | ICD-10-CM | POA: Diagnosis not present

## 2023-01-29 DIAGNOSIS — Z4682 Encounter for fitting and adjustment of non-vascular catheter: Secondary | ICD-10-CM | POA: Diagnosis not present

## 2023-01-29 DIAGNOSIS — E43 Unspecified severe protein-calorie malnutrition: Secondary | ICD-10-CM | POA: Insufficient documentation

## 2023-01-29 DIAGNOSIS — R609 Edema, unspecified: Secondary | ICD-10-CM

## 2023-01-29 DIAGNOSIS — E785 Hyperlipidemia, unspecified: Secondary | ICD-10-CM | POA: Diagnosis present

## 2023-01-29 DIAGNOSIS — N179 Acute kidney failure, unspecified: Secondary | ICD-10-CM | POA: Diagnosis present

## 2023-01-29 DIAGNOSIS — R000 Tachycardia, unspecified: Secondary | ICD-10-CM | POA: Diagnosis not present

## 2023-01-29 DIAGNOSIS — Z961 Presence of intraocular lens: Secondary | ICD-10-CM | POA: Diagnosis present

## 2023-01-29 DIAGNOSIS — J9621 Acute and chronic respiratory failure with hypoxia: Secondary | ICD-10-CM | POA: Diagnosis present

## 2023-01-29 DIAGNOSIS — J449 Chronic obstructive pulmonary disease, unspecified: Secondary | ICD-10-CM | POA: Diagnosis present

## 2023-01-29 DIAGNOSIS — I132 Hypertensive heart and chronic kidney disease with heart failure and with stage 5 chronic kidney disease, or end stage renal disease: Principal | ICD-10-CM | POA: Diagnosis present

## 2023-01-29 DIAGNOSIS — Z91158 Patient's noncompliance with renal dialysis for other reason: Secondary | ICD-10-CM

## 2023-01-29 DIAGNOSIS — I2510 Atherosclerotic heart disease of native coronary artery without angina pectoris: Secondary | ICD-10-CM | POA: Diagnosis present

## 2023-01-29 DIAGNOSIS — J9691 Respiratory failure, unspecified with hypoxia: Secondary | ICD-10-CM | POA: Diagnosis not present

## 2023-01-29 DIAGNOSIS — I700 Atherosclerosis of aorta: Secondary | ICD-10-CM | POA: Diagnosis not present

## 2023-01-29 DIAGNOSIS — Z7401 Bed confinement status: Secondary | ICD-10-CM | POA: Diagnosis not present

## 2023-01-29 DIAGNOSIS — J9601 Acute respiratory failure with hypoxia: Secondary | ICD-10-CM | POA: Diagnosis present

## 2023-01-29 DIAGNOSIS — I5033 Acute on chronic diastolic (congestive) heart failure: Secondary | ICD-10-CM | POA: Diagnosis not present

## 2023-01-29 DIAGNOSIS — Z821 Family history of blindness and visual loss: Secondary | ICD-10-CM

## 2023-01-29 DIAGNOSIS — N189 Chronic kidney disease, unspecified: Secondary | ICD-10-CM | POA: Diagnosis not present

## 2023-01-29 DIAGNOSIS — Z8679 Personal history of other diseases of the circulatory system: Secondary | ICD-10-CM

## 2023-01-29 DIAGNOSIS — R40243 Glasgow coma scale score 3-8, unspecified time: Secondary | ICD-10-CM | POA: Diagnosis not present

## 2023-01-29 DIAGNOSIS — R0600 Dyspnea, unspecified: Secondary | ICD-10-CM | POA: Diagnosis not present

## 2023-01-29 DIAGNOSIS — D500 Iron deficiency anemia secondary to blood loss (chronic): Secondary | ICD-10-CM | POA: Diagnosis not present

## 2023-01-29 DIAGNOSIS — K921 Melena: Secondary | ICD-10-CM | POA: Diagnosis not present

## 2023-01-29 DIAGNOSIS — R0602 Shortness of breath: Secondary | ICD-10-CM | POA: Diagnosis not present

## 2023-01-29 DIAGNOSIS — R001 Bradycardia, unspecified: Secondary | ICD-10-CM | POA: Diagnosis not present

## 2023-01-29 DIAGNOSIS — Z992 Dependence on renal dialysis: Secondary | ICD-10-CM | POA: Diagnosis not present

## 2023-01-29 DIAGNOSIS — M79605 Pain in left leg: Secondary | ICD-10-CM | POA: Diagnosis not present

## 2023-01-29 DIAGNOSIS — Z515 Encounter for palliative care: Secondary | ICD-10-CM

## 2023-01-29 DIAGNOSIS — I959 Hypotension, unspecified: Secondary | ICD-10-CM | POA: Diagnosis not present

## 2023-01-29 DIAGNOSIS — R571 Hypovolemic shock: Secondary | ICD-10-CM | POA: Diagnosis not present

## 2023-01-29 DIAGNOSIS — Z9842 Cataract extraction status, left eye: Secondary | ICD-10-CM

## 2023-01-29 DIAGNOSIS — I10 Essential (primary) hypertension: Secondary | ICD-10-CM | POA: Diagnosis not present

## 2023-01-29 DIAGNOSIS — D631 Anemia in chronic kidney disease: Secondary | ICD-10-CM | POA: Diagnosis present

## 2023-01-29 DIAGNOSIS — I259 Chronic ischemic heart disease, unspecified: Secondary | ICD-10-CM | POA: Diagnosis not present

## 2023-01-29 DIAGNOSIS — I129 Hypertensive chronic kidney disease with stage 1 through stage 4 chronic kidney disease, or unspecified chronic kidney disease: Secondary | ICD-10-CM | POA: Diagnosis not present

## 2023-01-29 DIAGNOSIS — Z9841 Cataract extraction status, right eye: Secondary | ICD-10-CM

## 2023-01-29 DIAGNOSIS — K260 Acute duodenal ulcer with hemorrhage: Secondary | ICD-10-CM | POA: Diagnosis not present

## 2023-01-29 DIAGNOSIS — I5041 Acute combined systolic (congestive) and diastolic (congestive) heart failure: Secondary | ICD-10-CM | POA: Diagnosis not present

## 2023-01-29 DIAGNOSIS — Z452 Encounter for adjustment and management of vascular access device: Secondary | ICD-10-CM | POA: Diagnosis not present

## 2023-01-29 DIAGNOSIS — I130 Hypertensive heart and chronic kidney disease with heart failure and stage 1 through stage 4 chronic kidney disease, or unspecified chronic kidney disease: Secondary | ICD-10-CM | POA: Diagnosis not present

## 2023-01-29 DIAGNOSIS — I890 Lymphedema, not elsewhere classified: Secondary | ICD-10-CM | POA: Diagnosis present

## 2023-01-29 DIAGNOSIS — K922 Gastrointestinal hemorrhage, unspecified: Secondary | ICD-10-CM | POA: Diagnosis not present

## 2023-01-29 DIAGNOSIS — I120 Hypertensive chronic kidney disease with stage 5 chronic kidney disease or end stage renal disease: Secondary | ICD-10-CM | POA: Diagnosis not present

## 2023-01-29 DIAGNOSIS — I714 Abdominal aortic aneurysm, without rupture, unspecified: Secondary | ICD-10-CM | POA: Diagnosis present

## 2023-01-29 DIAGNOSIS — R0902 Hypoxemia: Secondary | ICD-10-CM | POA: Diagnosis not present

## 2023-01-29 DIAGNOSIS — Z7982 Long term (current) use of aspirin: Secondary | ICD-10-CM

## 2023-01-29 DIAGNOSIS — J811 Chronic pulmonary edema: Secondary | ICD-10-CM | POA: Diagnosis not present

## 2023-01-29 DIAGNOSIS — J9811 Atelectasis: Secondary | ICD-10-CM | POA: Diagnosis present

## 2023-01-29 DIAGNOSIS — R7989 Other specified abnormal findings of blood chemistry: Secondary | ICD-10-CM | POA: Diagnosis not present

## 2023-01-29 DIAGNOSIS — J90 Pleural effusion, not elsewhere classified: Secondary | ICD-10-CM | POA: Diagnosis not present

## 2023-01-29 DIAGNOSIS — R4182 Altered mental status, unspecified: Secondary | ICD-10-CM | POA: Diagnosis not present

## 2023-01-29 LAB — CBC WITH DIFFERENTIAL/PLATELET
Abs Immature Granulocytes: 0.02 10*3/uL (ref 0.00–0.07)
Basophils Absolute: 0 10*3/uL (ref 0.0–0.1)
Basophils Relative: 0 %
Eosinophils Absolute: 0 10*3/uL (ref 0.0–0.5)
Eosinophils Relative: 0 %
HCT: 59.8 % — ABNORMAL HIGH (ref 39.0–52.0)
Hemoglobin: 18.3 g/dL — ABNORMAL HIGH (ref 13.0–17.0)
Immature Granulocytes: 0 %
Lymphocytes Relative: 10 %
Lymphs Abs: 0.8 10*3/uL (ref 0.7–4.0)
MCH: 32.7 pg (ref 26.0–34.0)
MCHC: 30.6 g/dL (ref 30.0–36.0)
MCV: 106.8 fL — ABNORMAL HIGH (ref 80.0–100.0)
Monocytes Absolute: 0.8 10*3/uL (ref 0.1–1.0)
Monocytes Relative: 10 %
Neutro Abs: 6.8 10*3/uL (ref 1.7–7.7)
Neutrophils Relative %: 80 %
Platelets: 80 10*3/uL — ABNORMAL LOW (ref 150–400)
RBC: 5.6 MIL/uL (ref 4.22–5.81)
RDW: 17.2 % — ABNORMAL HIGH (ref 11.5–15.5)
WBC: 8.6 10*3/uL (ref 4.0–10.5)
nRBC: 0 % (ref 0.0–0.2)

## 2023-01-29 LAB — URINALYSIS, COMPLETE (UACMP) WITH MICROSCOPIC
Bilirubin Urine: NEGATIVE
Glucose, UA: NEGATIVE mg/dL
Ketones, ur: 5 mg/dL — AB
Nitrite: NEGATIVE
Protein, ur: NEGATIVE mg/dL
Specific Gravity, Urine: 1.014 (ref 1.005–1.030)
pH: 5 (ref 5.0–8.0)

## 2023-01-29 LAB — RENAL FUNCTION PANEL
Albumin: 3.3 g/dL — ABNORMAL LOW (ref 3.5–5.0)
Anion gap: 15 (ref 5–15)
BUN: 129 mg/dL — ABNORMAL HIGH (ref 8–23)
CO2: 27 mmol/L (ref 22–32)
Calcium: 9.1 mg/dL (ref 8.9–10.3)
Chloride: 92 mmol/L — ABNORMAL LOW (ref 98–111)
Creatinine, Ser: 6.14 mg/dL — ABNORMAL HIGH (ref 0.61–1.24)
GFR, Estimated: 9 mL/min — ABNORMAL LOW (ref >60)
Glucose, Bld: 149 mg/dL — ABNORMAL HIGH (ref 70–99)
Phosphorus: 8.5 mg/dL — ABNORMAL HIGH (ref 2.5–4.6)
Potassium: 5.5 mmol/L — ABNORMAL HIGH (ref 3.5–5.1)
Sodium: 134 mmol/L — ABNORMAL LOW (ref 135–145)

## 2023-01-29 LAB — COMPREHENSIVE METABOLIC PANEL
ALT: 13 U/L (ref 0–44)
AST: 36 U/L (ref 15–41)
Albumin: 3.7 g/dL (ref 3.5–5.0)
Alkaline Phosphatase: 75 U/L (ref 38–126)
Anion gap: 16 — ABNORMAL HIGH (ref 5–15)
BUN: 136 mg/dL — ABNORMAL HIGH (ref 8–23)
CO2: 31 mmol/L (ref 22–32)
Calcium: 9.9 mg/dL (ref 8.9–10.3)
Chloride: 91 mmol/L — ABNORMAL LOW (ref 98–111)
Creatinine, Ser: 6.11 mg/dL — ABNORMAL HIGH (ref 0.61–1.24)
GFR, Estimated: 9 mL/min — ABNORMAL LOW (ref >60)
Glucose, Bld: 126 mg/dL — ABNORMAL HIGH (ref 70–99)
Potassium: 5 mmol/L (ref 3.5–5.1)
Sodium: 138 mmol/L (ref 135–145)
Total Bilirubin: 2.1 mg/dL — ABNORMAL HIGH (ref 0.3–1.2)
Total Protein: 7.1 g/dL (ref 6.5–8.1)

## 2023-01-29 LAB — BRAIN NATRIURETIC PEPTIDE
B Natriuretic Peptide: 2499 pg/mL — ABNORMAL HIGH (ref 0.0–100.0)
B Natriuretic Peptide: 2474.3 pg/mL — ABNORMAL HIGH (ref 0.0–100.0)

## 2023-01-29 LAB — APTT: aPTT: 32 seconds (ref 24–36)

## 2023-01-29 LAB — BLOOD GAS, ARTERIAL
Acid-Base Excess: 1.2 mmol/L (ref 0.0–2.0)
Acid-Base Excess: 2.4 mmol/L — ABNORMAL HIGH (ref 0.0–2.0)
Bicarbonate: 32.8 mmol/L — ABNORMAL HIGH (ref 20.0–28.0)
Bicarbonate: 30.5 mmol/L — ABNORMAL HIGH (ref 20.0–28.0)
FIO2: 40 %
MECHVT: 450 mL
Mechanical Rate: 15
O2 Content: 3.5 L/min
O2 Saturation: 95.6 %
O2 Saturation: 95.9 %
PEEP: 5 cmH2O
Patient temperature: 37
Patient temperature: 37
pCO2 arterial: 92 mmHg (ref 32–48)
pCO2 arterial: 62 mmHg — ABNORMAL HIGH (ref 32–48)
pH, Arterial: 7.16 — CL (ref 7.35–7.45)
pH, Arterial: 7.3 — ABNORMAL LOW (ref 7.35–7.45)
pO2, Arterial: 78 mmHg — ABNORMAL LOW (ref 83–108)
pO2, Arterial: 72 mmHg — ABNORMAL LOW (ref 83–108)

## 2023-01-29 LAB — CBC
HCT: 54.3 % — ABNORMAL HIGH (ref 39.0–52.0)
Hemoglobin: 16.8 g/dL (ref 13.0–17.0)
MCH: 32.6 pg (ref 26.0–34.0)
MCHC: 30.9 g/dL (ref 30.0–36.0)
MCV: 105.2 fL — ABNORMAL HIGH (ref 80.0–100.0)
Platelets: 91 10*3/uL — ABNORMAL LOW (ref 150–400)
RBC: 5.16 MIL/uL (ref 4.22–5.81)
RDW: 16.5 % — ABNORMAL HIGH (ref 11.5–15.5)
WBC: 11.4 10*3/uL — ABNORMAL HIGH (ref 4.0–10.5)
nRBC: 0.4 % — ABNORMAL HIGH (ref 0.0–0.2)

## 2023-01-29 LAB — TROPONIN I (HIGH SENSITIVITY)
Troponin I (High Sensitivity): 2351 ng/L (ref <18)
Troponin I (High Sensitivity): 2252 ng/L (ref <18)
Troponin I (High Sensitivity): 1834 ng/L (ref <18)

## 2023-01-29 LAB — MRSA NEXT GEN BY PCR, NASAL: MRSA by PCR Next Gen: NOT DETECTED

## 2023-01-29 LAB — GLUCOSE, CAPILLARY
Glucose-Capillary: 115 mg/dL — ABNORMAL HIGH (ref 70–99)
Glucose-Capillary: 138 mg/dL — ABNORMAL HIGH (ref 70–99)
Glucose-Capillary: 143 mg/dL — ABNORMAL HIGH (ref 70–99)

## 2023-01-29 LAB — TYPE AND SCREEN
ABO/RH(D): O POS
Antibody Screen: NEGATIVE

## 2023-01-29 LAB — ABO/RH: ABO/RH(D): O POS

## 2023-01-29 LAB — PROTIME-INR
INR: 1.3 — ABNORMAL HIGH (ref 0.8–1.2)
Prothrombin Time: 16.5 seconds — ABNORMAL HIGH (ref 11.4–15.2)

## 2023-01-29 LAB — MAGNESIUM: Magnesium: 2.5 mg/dL — ABNORMAL HIGH (ref 1.7–2.4)

## 2023-01-29 MED ORDER — FUROSEMIDE 10 MG/ML IJ SOLN
8.0000 mg/h | INTRAVENOUS | Status: DC
  Filled 2023-01-29: qty 20

## 2023-01-29 MED ORDER — PIPERACILLIN-TAZOBACTAM 3.375 G IVPB 30 MIN
3.3750 g | Freq: Once | INTRAVENOUS | Status: AC
  Administered 2023-01-29: 3.375 g via INTRAVENOUS
  Filled 2023-01-29 (×2): qty 50

## 2023-01-29 MED ORDER — ASPIRIN 300 MG RE SUPP
300.0000 mg | RECTAL | Status: AC
  Administered 2023-01-29: 300 mg via RECTAL
  Filled 2023-01-29: qty 1

## 2023-01-29 MED ORDER — SODIUM CHLORIDE 0.9% IV SOLUTION: Freq: Once | INTRAVENOUS | Status: AC

## 2023-01-29 MED ORDER — PIPERACILLIN-TAZOBACTAM IN DEX 2-0.25 GM/50ML IV SOLN
2.2500 g | Freq: Three times a day (TID) | INTRAVENOUS | Status: DC
  Filled 2023-01-29: qty 50

## 2023-01-29 MED ORDER — CALCIUM GLUCONATE-NACL 1-0.675 GM/50ML-% IV SOLN
1.0000 g | Freq: Once | INTRAVENOUS | Status: DC
  Filled 2023-01-29: qty 50

## 2023-01-29 MED ORDER — DOCUSATE SODIUM 100 MG PO CAPS
100.0000 mg | ORAL_CAPSULE | Freq: Two times a day (BID) | ORAL | Status: DC | PRN
  Filled 2023-01-29: qty 1

## 2023-01-29 MED ORDER — POLYETHYLENE GLYCOL 3350 17 G PO PACK
17.0000 g | PACK | Freq: Every day | ORAL | Status: DC
  Administered 2023-01-31 – 2023-02-03 (×4): 17 g
  Filled 2023-01-29 (×4): qty 1

## 2023-01-29 MED ORDER — SODIUM CHLORIDE 0.9 % FOR CRRT: INTRAVENOUS_CENTRAL | Status: DC | PRN

## 2023-01-29 MED ORDER — ROCURONIUM BROMIDE 10 MG/ML (PF) SYRINGE
40.0000 mg | PREFILLED_SYRINGE | Freq: Once | INTRAVENOUS | Status: AC
  Administered 2023-01-29: 40 mg via INTRAVENOUS
  Filled 2023-01-29: qty 10

## 2023-01-29 MED ORDER — DEXTROSE 50 % IV SOLN: 1.0000 | Freq: Once | INTRAVENOUS | Status: DC

## 2023-01-29 MED ORDER — MIDAZOLAM HCL 2 MG/2ML IJ SOLN
2.0000 mg | Freq: Once | INTRAMUSCULAR | Status: AC
  Administered 2023-01-29: 2 mg via INTRAVENOUS
  Filled 2023-01-29: qty 2

## 2023-01-29 MED ORDER — NOREPINEPHRINE 16 MG/250ML-% IV SOLN
0.0000 ug/min | INTRAVENOUS | Status: DC
  Administered 2023-01-29: 25 ug/min via INTRAVENOUS
  Administered 2023-01-30: 40 ug/min via INTRAVENOUS
  Administered 2023-01-31: 8 ug/min via INTRAVENOUS
  Administered 2023-02-02: 15 ug/min via INTRAVENOUS
  Administered 2023-02-05: 2 ug/min via INTRAVENOUS
  Filled 2023-01-29 (×5): qty 250

## 2023-01-29 MED ORDER — MIDAZOLAM HCL 2 MG/2ML IJ SOLN
1.0000 mg | INTRAMUSCULAR | Status: DC | PRN
  Administered 2023-01-30: 1 mg via INTRAVENOUS
  Filled 2023-01-29 (×2): qty 2

## 2023-01-29 MED ORDER — NOREPINEPHRINE 4 MG/250ML-% IV SOLN
0.0000 ug/min | INTRAVENOUS | Status: DC
  Administered 2023-01-29: 22 ug/min via INTRAVENOUS
  Administered 2023-01-29: 4 ug/min via INTRAVENOUS
  Administered 2023-01-29 (×2): 20 ug/min via INTRAVENOUS
  Filled 2023-01-29 (×3): qty 250

## 2023-01-29 MED ORDER — SODIUM CHLORIDE 0.9 % IV BOLUS
1000.0000 mL | Freq: Once | INTRAVENOUS | Status: AC
  Administered 2023-01-29: 500 mL via INTRAVENOUS

## 2023-01-29 MED ORDER — PIPERACILLIN-TAZOBACTAM 3.375 G IVPB 30 MIN
3.3750 g | Freq: Four times a day (QID) | INTRAVENOUS | Status: DC
  Administered 2023-01-29 – 2023-01-30 (×2): 3.375 g via INTRAVENOUS
  Filled 2023-01-29 (×5): qty 50

## 2023-01-29 MED ORDER — ORAL CARE MOUTH RINSE
15.0000 mL | OROMUCOSAL | Status: DC
  Administered 2023-01-29 – 2023-02-05 (×83): 15 mL via OROMUCOSAL

## 2023-01-29 MED ORDER — ORAL CARE MOUTH RINSE: 15.0000 mL | OROMUCOSAL | Status: DC | PRN

## 2023-01-29 MED ORDER — VANCOMYCIN HCL 1250 MG/250ML IV SOLN: 1250.0000 mg | INTRAVENOUS | Status: DC

## 2023-01-29 MED ORDER — PRISMASOL BGK 0/2.5 32-2.5 MEQ/L EC SOLN: Status: DC

## 2023-01-29 MED ORDER — PIPERACILLIN-TAZOBACTAM 3.375 G IVPB: 3.3750 g | Freq: Four times a day (QID) | INTRAVENOUS | Status: DC

## 2023-01-29 MED ORDER — VANCOMYCIN VARIABLE DOSE PER UNSTABLE RENAL FUNCTION (PHARMACIST DOSING): Status: DC

## 2023-01-29 MED ORDER — PANTOPRAZOLE SODIUM 40 MG IV SOLR
40.0000 mg | Freq: Every day | INTRAVENOUS | Status: DC
  Administered 2023-01-29 – 2023-02-05 (×8): 40 mg via INTRAVENOUS
  Filled 2023-01-29 (×8): qty 10

## 2023-01-29 MED ORDER — SODIUM BICARBONATE 8.4 % IV SOLN
INTRAVENOUS | Status: AC
  Administered 2023-01-29: 50 meq via INTRAVENOUS
  Filled 2023-01-29: qty 50

## 2023-01-29 MED ORDER — SODIUM BICARBONATE 8.4 % IV SOLN: 50.0000 meq | Freq: Once | INTRAVENOUS | Status: AC

## 2023-01-29 MED ORDER — HEPARIN SODIUM (PORCINE) 1000 UNIT/ML DIALYSIS
1000.0000 [IU] | INTRAMUSCULAR | Status: DC | PRN
  Administered 2023-01-30: 3600 [IU] via INTRAVENOUS_CENTRAL
  Administered 2023-01-30: 4000 [IU] via INTRAVENOUS_CENTRAL
  Administered 2023-01-31: 3600 [IU] via INTRAVENOUS_CENTRAL
  Administered 2023-02-01 – 2023-02-02 (×2): 4000 [IU] via INTRAVENOUS_CENTRAL
  Administered 2023-02-02: 3000 [IU] via INTRAVENOUS_CENTRAL
  Administered 2023-02-02: 4000 [IU] via INTRAVENOUS_CENTRAL
  Administered 2023-02-03: 3000 [IU] via INTRAVENOUS_CENTRAL
  Administered 2023-02-04: 3600 [IU] via INTRAVENOUS_CENTRAL
  Filled 2023-01-29: qty 6
  Filled 2023-01-29 (×2): qty 4
  Filled 2023-01-29 (×4): qty 6
  Filled 2023-01-29: qty 3
  Filled 2023-01-29: qty 6
  Filled 2023-01-29: qty 2
  Filled 2023-01-29 (×3): qty 6

## 2023-01-29 MED ORDER — INSULIN ASPART 100 UNIT/ML IV SOLN
10.0000 [IU] | Freq: Once | INTRAVENOUS | Status: DC
  Filled 2023-01-29: qty 0.1

## 2023-01-29 MED ORDER — VANCOMYCIN HCL 2000 MG/400ML IV SOLN
2000.0000 mg | Freq: Once | INTRAVENOUS | Status: AC
  Administered 2023-01-29: 2000 mg via INTRAVENOUS
  Filled 2023-01-29: qty 400

## 2023-01-29 MED ORDER — CHLORHEXIDINE GLUCONATE CLOTH 2 % EX PADS
6.0000 | MEDICATED_PAD | Freq: Every day | CUTANEOUS | Status: DC
  Administered 2023-01-29 – 2023-02-09 (×11): 6 via TOPICAL

## 2023-01-29 MED ORDER — DOCUSATE SODIUM 50 MG/5ML PO LIQD
100.0000 mg | Freq: Two times a day (BID) | ORAL | Status: DC
  Administered 2023-01-30 – 2023-02-05 (×9): 100 mg
  Filled 2023-01-29 (×9): qty 10

## 2023-01-29 MED ORDER — SODIUM CHLORIDE 0.9 % IV BOLUS: 500.0000 mL | Freq: Once | INTRAVENOUS | Status: DC

## 2023-01-29 MED ORDER — VASOPRESSIN 20 UNITS/100 ML INFUSION FOR SHOCK
0.0000 [IU]/min | INTRAVENOUS | Status: DC
  Administered 2023-01-30 – 2023-02-02 (×10): 0.04 [IU]/min via INTRAVENOUS
  Filled 2023-01-29 (×11): qty 100

## 2023-01-29 MED ORDER — FENTANYL CITRATE (PF) 100 MCG/2ML IJ SOLN
100.0000 ug | Freq: Once | INTRAMUSCULAR | Status: AC
  Administered 2023-01-29: 100 ug via INTRAVENOUS
  Filled 2023-01-29: qty 2

## 2023-01-29 MED ORDER — MIDAZOLAM HCL 2 MG/2ML IJ SOLN
2.0000 mg | Freq: Once | INTRAMUSCULAR | Status: DC
  Filled 2023-01-29: qty 2

## 2023-01-29 MED ORDER — ASPIRIN 81 MG PO CHEW: 324.0000 mg | CHEWABLE_TABLET | ORAL | Status: AC

## 2023-01-29 MED ORDER — FENTANYL 2500MCG IN NS 250ML (10MCG/ML) PREMIX INFUSION
0.0000 ug/h | INTRAVENOUS | Status: DC
  Administered 2023-01-29: 25 ug/h via INTRAVENOUS
  Administered 2023-01-30 – 2023-02-01 (×4): 200 ug/h via INTRAVENOUS
  Administered 2023-02-01 – 2023-02-02 (×2): 150 ug/h via INTRAVENOUS
  Filled 2023-01-29 (×7): qty 250

## 2023-01-29 MED ORDER — POLYETHYLENE GLYCOL 3350 17 G PO PACK: 17.0000 g | PACK | Freq: Every day | ORAL | Status: DC | PRN

## 2023-01-29 NOTE — ED Notes (Signed)
Urinal attached to pt's bedrail and family notified next time pt wakes and thinks he can provide sample it is needed.

## 2023-01-29 NOTE — Procedures (Signed)
Arterial Line Placement:  Indication: Frequent blood draws; Invasive BP monitoring.   Consent: Emergent.   Hand washing performed prior to starting the procedure.   Procedure: An active timeout was performed and correct patient, name, & ID confirmed. Physicial exam was performed to ensure adequate perfusion.  Using sterile technique, an aterial line was inserted into the right Femoral artery.  Catheter threaded and the needle was removed with appropriate blood return.  Arterial waveform was noted.  After the procedure, the patient's extremities were observed to be pink and warm.   Ultrasound guidance was utilized for this procedure  Estimated Blood Loss: None .   Number of Attempts: 1.   Complications: None .     Ottie Glazier, M.D.  Pulmonary & [place]

## 2023-01-29 NOTE — Progress Notes (Signed)
Orogastric tube advanced per MD order after KUB being completed.  Orogastric tube 45cm at lip and was advanced to 60cm at lip.

## 2023-01-29 NOTE — ED Notes (Signed)
Attempted to pull blood off current IV without success; assessed for placement of 2nd IV; will need [place] for placement; will place once [place] available.

## 2023-01-29 NOTE — ED Notes (Addendum)
Provider Bradler assessed pt's L lower leg wound; wound red/pink; superficial; large in size--about that of softball; fluid slowly seeping from it. EDP Bradler left band-aid off and placed fresh bed pad under pt's leg.

## 2023-01-29 NOTE — Procedures (Signed)
Endotracheal Intubation: Patient required placement of an artificial airway secondary to Respiratory Failure  Consent: Emergent.   Hand washing performed prior to starting the procedure.   Medications administered for sedation prior to procedure:  Midazolam 2 mg IV,  Rocuronium 10 mg IV, Fentanyl 100 mcg IV.    A time out procedure was called and correct patient, name, & ID confirmed. Needed supplies and equipment were assembled and checked to include ETT, 10 ml syringe, Glidescope, Mac and Miller blades, suction, oxygen and bag mask valve, end tidal CO2 monitor.   Patient was positioned to align the mouth and pharynx to facilitate visualization of the glottis.   Heart rate, SpO2 and blood pressure was continuously monitored during the procedure. Pre-oxygenation was conducted prior to intubation and endotracheal tube was placed through the vocal cords into the trachea.     The artificial airway was placed under direct visualization via glidescope route using a 8.0 ETT on the first attempt.  ETT was secured at 25 cm mark.  Placement was confirmed by auscuitation of lungs with good breath sounds bilaterally and no stomach sounds.  Condensation was noted on endotracheal tube.   Pulse ox 98%.  CO2 detector in place with appropriate color change.   Complications: None .    Chest radiograph ordered and pending.   Comments: OGT placed via glidescope.   Rusti Arizmendi, M.D.  Pulmonary & Critical Care Medicine  [facility]       

## 2023-01-29 NOTE — ED Notes (Signed)
IV team member [place] notified attending and this RN via secure chat that central line would be better fit for this pt than mid-line. Will hang antibiotics once cultures obtained or when attending otherwise tells this RN to go ahead and hang them as remains on verbal hold until cultures obtained.

## 2023-01-29 NOTE — ED Notes (Signed)
EDP Bradler notified via secure chat that fluids stopped after total of 250cc given.

## 2023-01-29 NOTE — ED Provider Notes (Signed)
[facility] Provider Note   Event Date/Time   First MD Initiated Contact with Patient 01/29/23 [phone]     (approximate) History  Leg Swelling  HPI John [name] is a 77 y.o. male with a past medical history of chronic kidney disease, CHF, and high blood pressure who presents after being called yesterday and told that his kidney function is significantly decreased.  Wife at bedside and provides history that patient has been increasingly somnolent, not sleeping well, and with decreased urine output.  Patient denies any complaints at this time other than worsening bilateral lower extremity swelling. ROS: Patient currently denies any vision changes, tinnitus, difficulty speaking, facial droop, sore throat, chest pain, shortness of breath, abdominal pain, nausea/vomiting/diarrhea, dysuria, or weakness/numbness/paresthesias in any extremity   Physical Exam  Triage Vital Signs: ED Triage Vitals  Enc Vitals Group     BP 01/29/23 0648 (!) 87/65     Pulse Rate 01/29/23 0648 (!) 119     Resp 01/29/23 0648 18     Temp 01/29/23 0648 97.6 F (36.4 C)     Temp Source 01/29/23 0648 Oral     SpO2 01/29/23 0648 100 %     Weight 01/29/23 0649 218 lb (98.9 kg)     Height 01/29/23 0649 5' 11"$  (1.803 m)     Head Circumference --      Peak Flow --      Pain Score 01/29/23 0649 0     Pain Loc --      Pain Edu? --      Excl. in [place]? --    Most recent vital signs: Vitals:   01/29/23 0904 01/29/23 0905  BP:  90/65  Pulse:    Resp: 18   Temp:    SpO2:     General: Awake, oriented x4. CV:  Good peripheral perfusion.  Resp:  Normal effort.  Abd:  No distention.  Other:  Elderly overweight African-American male laying in bed in no acute distress.  3+ pitting edema to bilateral lower extremities with active weeping ED Results / Procedures / Treatments  Labs (all labs ordered are listed, but only abnormal results are displayed) Labs Reviewed  CBC WITH DIFFERENTIAL/PLATELET -  Abnormal; Notable for the following components:      Result Value   Hemoglobin 18.3 (*)    HCT 59.8 (*)    MCV 106.8 (*)    RDW 17.2 (*)    Platelets 80 (*)    All other components within normal limits  COMPREHENSIVE METABOLIC PANEL - Abnormal; Notable for the following components:   Chloride 91 (*)    Glucose, Bld 126 (*)    BUN 136 (*)    Creatinine, Ser 6.11 (*)    Total Bilirubin 2.1 (*)    GFR, Estimated 9 (*)    Anion gap 16 (*)    All other components within normal limits  BRAIN NATRIURETIC PEPTIDE - Abnormal; Notable for the following components:   B Natriuretic Peptide 2,499.0 (*)    All other components within normal limits  TROPONIN I (HIGH SENSITIVITY) - Abnormal; Notable for the following components:   Troponin I (High Sensitivity) 2,351 (*)    All other components within normal limits  URINALYSIS, ROUTINE W REFLEX MICROSCOPIC  PROTIME-INR  APTT  TROPONIN I (HIGH SENSITIVITY)   EKG ED ECG REPORT I, Naaman Plummer, the attending physician, personally viewed and interpreted this ECG. Date: 01/29/2023 EKG Time: 0659 Rate: 79 Rhythm: normal sinus rhythm QRS  Axis: normal Intervals: normal ST/T Wave abnormalities: normal Narrative Interpretation: no evidence of acute ischemia RADIOLOGY ED MD interpretation: Single view portable chest x-ray interpreted independently by me shows vascular congestion with bilateral atelectasis and infiltrate as well as -Agree with radiology assessment Official radiology report(s): DG Chest Portable 1 View  Result Date: 01/29/2023 CLINICAL DATA:  Shortness of breath. EXAM: PORTABLE CHEST 1 VIEW COMPARISON:  01/21/2022 FINDINGS: Leftward patient rotation. The cardio pericardial silhouette is enlarged. Vascular congestion noted with bibasilar atelectasis/infiltrate and probable layering small bilateral effusions. The visualized bony structures of the thorax are unremarkable. Telemetry leads overlie the chest. IMPRESSION: Vascular  congestion with bibasilar atelectasis/infiltrate and probable layering small bilateral effusions. Electronically Signed   By: Misty Stanley M.D.   On: 01/29/2023 08:38   PROCEDURES: Critical Care performed: Yes, see critical care procedure note(s) .1-3 Lead EKG Interpretation  Performed by: Naaman Plummer, MD Authorized by: Naaman Plummer, MD     Interpretation: normal     ECG rate:  71   ECG rate assessment: normal     Rhythm: sinus rhythm     Ectopy: none     Conduction: normal    MEDICATIONS ORDERED IN ED: Medications  norepinephrine (LEVOPHED) 91m in 2562m(0.016 mg/mL) premix infusion (18 mcg/min Intravenous Rate/Dose Change 01/29/23 0908)  sodium chloride 0.9 % bolus 1,000 mL (0 mLs Intravenous Stopped 01/29/23 0840)   IMPRESSION / MDM / ASSESSMENT AND PLAN / ED COURSE  I reviewed the triage vital signs and the nursing notes.                             The patient is on the cardiac monitor to evaluate for evidence of arrhythmia and/or significant heart rate changes. Patient's presentation is most consistent with acute presentation with potential threat to life or bodily function.  This patient presents to the ED for concern of worsening renal function and bilateral lower extremity edema, this involves an extensive number of treatment options, and is a complaint that carries with it a high risk of complications and morbidity.  The differential diagnosis includes acute on chronic renal failure, acute on chronic heart failure, ACS, PE, DVT Co morbidities that complicate the patient evaluation  CHF, CKD Additional history obtained:  Additional history obtained from patient and patient's wife  External records from outside source obtained and reviewed including note from CHF clinic on 01/28/2023 Lab Tests:  I Ordered, and personally interpreted labs.  The pertinent results include: BNP 2499, troponin 2351, creatinine 6.1, BUN 136 Imaging Studies ordered:  I ordered imaging  studies including chest x-ray  I independently visualized and interpreted imaging which showed vascular congestion and bilateral pleural effusions  I agree with the radiologist interpretation Cardiac Monitoring: / EKG:  The patient was maintained on a cardiac monitor.  I personally viewed and interpreted the cardiac monitored which showed an underlying rhythm of: Normal sinus rhythm Consultations Obtained:  I requested consultation with the critical care and nephrology,  and discussed lab and imaging findings as well as pertinent plan - they recommend: Admission and temporary dialysis Problem List / ED Course / Critical interventions / Medication management  Acute on chronic renal failure, acute respiratory failure with hypoxia, heart failure exacerbation  I ordered medication including norepinephrine for hypotension  Reevaluation of the patient after these medicines showed that the patient improved  I have reviewed the patients home medicines and have made adjustments as needed  Dispo: Admit to the ICU    FINAL CLINICAL IMPRESSION(S) / ED DIAGNOSES   Final diagnoses:  Peripheral edema  Acute renal failure superimposed on chronic kidney disease, unspecified acute renal failure type, unspecified CKD stage (HCC)  Acute on chronic congestive heart failure, unspecified heart failure type ([place])   Rx / DC Orders   ED Discharge Orders     None      Note:  This document was prepared using Dragon voice recognition software and may include unintentional dictation errors.   Naaman Plummer, MD 01/29/23 [phone]

## 2023-01-29 NOTE — ED Notes (Signed)
Attempted report to ICU; staff stated that bed placement is taking bed back.

## 2023-01-29 NOTE — ED Notes (Signed)
2nd phlebotomist at bedside.

## 2023-01-29 NOTE — ED Notes (Signed)
Attempted for 20g [place] IV at R fa.

## 2023-01-29 NOTE — Progress Notes (Signed)
Dr. Lanney Gins notified of ABG results with verbal order for BiPAP.  MD also mad aware of being unable to palpate or doppler bilateral pedal pulses. Lower extremities cool to touch.  This nurse able to doppler popliteal and radial pulses. No orders obtained for absent pulses, information acknowledged by MD

## 2023-01-29 NOTE — Progress Notes (Signed)
Patient electively intubated by Dr. Lanney Gins with 8.0 ETT, 26 cm at lip.  Positive color change on c02 detector, condensation noted in ETT, equal bilateral breath sounds.

## 2023-01-29 NOTE — ED Notes (Signed)
Phlebotomy to bedside.

## 2023-01-29 NOTE — ED Notes (Signed)
IV team at bedside to reassess L fa IV and place new IV if needed as infiltration noted in ac/upper L arm due to attempt for IV placement by other RN.

## 2023-01-29 NOTE — ED Notes (Signed)
Pt's family states pt sleeps intermittently throughout day regularly; states this is not new or not because pt recently had lack of sleep. Pt to remain on monitor and dinamap since pulse ox now reading accurately.

## 2023-01-29 NOTE — Progress Notes (Signed)

## 2023-01-29 NOTE — ED Notes (Signed)
Obtained extra IV channel in anticipation of starting IV lasix gtt; will hang once received from pharm.

## 2023-01-29 NOTE — H&P (Signed)
CRITICAL CARE     Name: John [name] MRN: [id number] DOB: [birth year]     LOS: 0   SUBJECTIVE FINDINGS & SIGNIFICANT EVENTS    History of presenting illness:    This is 77 year old male with a history of essential hypertension, on hydralazine and Cozaar, stage IV CKD but has not been on dialysis in the past, has chronic diastolic and systolic heart failure with cor pulmonale, moderate tricuspid regurgitation and aortic valve regurgitation, history of a ascending aortic aneurysm, coronary artery disease with atherosclerosis, COPD and chronic hypoxemia baseline 2 L/min supplemental oxygen at home with recurrent episodes of hypoxemia and remote acute exacerbation of COPD with hypoxemia and hypercapnia 2 months ago history of ruptured left quadricep tendon 2 years ago, chronic thrombocytopenia and erythrocytosis recurrent lower extremity cellulitis, recurrent hyperkalemia, morbid obesity history of anasarca dyslipidemia, recent admission for acute CHF exacerbation requiring Lasix drip and BiPAP support, who was brought in by EMS due to worsening altered mental status and circulatory shock.  I was able to meet with wife at bedside during my evaluation who shares patient has been sleeping majority of each day over the last week has been on arousable and received phone call from medical provider regarding worsening kidney function which prompted ER evaluation.  He had chest x-ray performed while in the ER showing bilateral pleural effusions with vascular congestion and atelectasis/infiltrate.  Patient in circulatory shock with Levophed support on admission in ER.  PCCM consultation for admission to medical intensive care unit with circulatory shock consistent with acute on chronic renal failure with CHF  exacerbation.  Lines/tubes :   Microbiology/Sepsis markers: Results for orders placed or performed during the hospital encounter of 01/28/22  Resp Panel by RT-PCR (Flu A&B, Covid) Nasopharyngeal Swab     Status: None   Collection Time: 01/28/22  7:08 PM   Specimen: Nasopharyngeal Swab; Nasopharyngeal(NP) swabs in vial transport medium  Result Value Ref Range Status   SARS Coronavirus 2 by RT PCR NEGATIVE NEGATIVE Final    Comment: (NOTE) SARS-CoV-2 target nucleic acids are NOT DETECTED.  The SARS-CoV-2 RNA is generally detectable in upper respiratory specimens during the acute phase of infection. The lowest concentration of SARS-CoV-2 viral copies this assay can detect is 138 copies/mL. A negative result does not preclude SARS-Cov-2 infection and should not be used as the sole basis for treatment or other patient management decisions. A negative result may occur with  improper specimen collection/handling, submission of specimen other than nasopharyngeal swab, presence of viral mutation(s) within the areas targeted by this assay, and inadequate number of viral copies(<138 copies/mL). A negative result must be combined with clinical observations, patient history, and epidemiological information. The expected result is Negative.  Fact Sheet for Patients:  EntrepreneurPulse.com.au  Fact Sheet for Healthcare Providers:  IncredibleEmployment.be  This test is no t yet approved or cleared by the [place] FDA and  has been authorized for detection and/or diagnosis of SARS-CoV-2 by FDA under an Emergency Use Authorization (EUA). This EUA will remain  in effect (meaning this test can be used) for the duration of the COVID-19 declaration under Section 564(b)(1) of the Act, 21 U.S.C.section 360bbb-3(b)(1), unless the authorization is terminated  or revoked sooner.       Influenza A by PCR NEGATIVE NEGATIVE Final   Influenza B by PCR NEGATIVE  NEGATIVE Final    Comment: (NOTE) The Xpert Xpress SARS-CoV-2/FLU/RSV plus assay is intended as an aid in the diagnosis of influenza from Nasopharyngeal swab specimens  and should not be used as a sole basis for treatment. Nasal washings and aspirates are unacceptable for Xpert Xpress SARS-CoV-2/FLU/RSV testing.  Fact Sheet for Patients: EntrepreneurPulse.com.au  Fact Sheet for Healthcare Providers: IncredibleEmployment.be  This test is not yet approved or cleared by the [place] FDA and has been authorized for detection and/or diagnosis of SARS-CoV-2 by FDA under an Emergency Use Authorization (EUA). This EUA will remain in effect (meaning this test can be used) for the duration of the COVID-19 declaration under Section 564(b)(1) of the Act, 21 U.S.C. section 360bbb-3(b)(1), unless the authorization is terminated or revoked.  Performed at [facility], [address], [place] [address]   MRSA Next Gen by PCR, Nasal     Status: None   Collection Time: 01/28/22  8:47 PM   Specimen: Nasal Mucosa; Nasal Swab  Result Value Ref Range Status   MRSA by PCR Next Gen NOT DETECTED NOT DETECTED Final    Comment: (NOTE) The GeneXpert MRSA Assay (FDA approved for NASAL specimens only), is one component of a comprehensive MRSA colonization surveillance program. It is not intended to diagnose MRSA infection nor to guide or monitor treatment for MRSA infections. Test performance is not FDA approved in patients less than 61 years old. Performed at [facility], [address], [place], [place] [address]     Anti-infectives:  Anti-infectives (From admission, onward)    None        PAST MEDICAL HISTORY   Past Medical History:  Diagnosis Date   CHF (congestive heart failure) (HCC)    EF 45% in 2021   Chronic kidney disease    COPD (chronic obstructive pulmonary disease) ([place]) 02/16/2020   Hyperlipidemia     Hypertension    Pneumonia 2021     SURGICAL HISTORY   Past Surgical History:  Procedure Laterality Date   CATARACT EXTRACTION W/PHACO Left 10/31/2021   Procedure: CATARACT EXTRACTION PHACO AND INTRAOCULAR LENS PLACEMENT ([place]) LEFT VISION BLUE 3.45 00:48.0;  Surgeon: Leandrew Koyanagi, MD;  Location: [place];  Service: Ophthalmology;  Laterality: Left;   CATARACT EXTRACTION W/PHACO Right 11/14/2021   Procedure: CATARACT EXTRACTION PHACO AND INTRAOCULAR LENS PLACEMENT ([facility]) RIGHT 8.59 01:10.5;  Surgeon: Leandrew Koyanagi, MD;  Location: [place];  Service: Ophthalmology;  Laterality: Right;   INGUINAL HERNIA REPAIR Right 02/04/2017   Procedure: HERNIA REPAIR INGUINAL ADULT;  Surgeon: Leonie Green, MD;  Location: [facility];  Service: General;  Laterality: Right;   NO PAST SURGERIES     QUADRICEPS TENDON REPAIR Left 02/15/2020   Procedure: REPAIR QUADRICEP TENDON;  Surgeon: Corky Mull, MD;  Location: [facility];  Service: Orthopedics;  Laterality: Left;     FAMILY HISTORY   Family History  Problem Relation Age of Onset   Diabetes Mother    Diabetes Brother    Diabetes Maternal Grandmother    Diabetes Paternal Grandmother      SOCIAL HISTORY   Social History   Tobacco Use   Smoking status: Former    Packs/day: 0.25    Years: 53.00    Total pack years: 13.25    Types: Cigarettes    Quit date: 02/15/2015    Years since quitting: 7.9   Smokeless tobacco: Never  Substance Use Topics   Alcohol use: Yes    Comment: occassional   Drug use: No     MEDICATIONS   Current Medication:  Current Facility-Administered Medications:    docusate sodium (COLACE) capsule 100 mg, 100  mg, Oral, BID PRN, Ottie Glazier, MD   norepinephrine (LEVOPHED) 71m in 2573m(0.016 mg/mL) premix infusion, 0-40 mcg/min, Intravenous, Continuous, Bradler, EvVista LawmanMD, Last Rate: 67.5 mL/hr at 01/29/23 1015, 18 mcg/min at 01/29/23 1015   polyethylene glycol (MIRALAX /  GLYCOLAX) packet 17 g, 17 g, Oral, Daily PRN, AlOttie GlazierMD  Current Outpatient Medications:    aspirin EC 81 MG tablet, Take 1 tablet (81 mg total) by mouth daily. Swallow whole., Disp: 90 tablet, Rfl: 3   calcitRIOL (ROCALTROL) 0.25 MCG capsule, Take 0.25 mcg by mouth daily., Disp: , Rfl:    cephALEXin (KEFLEX) 500 MG capsule, Take 500 mg by mouth 3 (three) times daily., Disp: , Rfl:    gabapentin (NEURONTIN) 100 MG capsule, Take 1 capsule (100 mg total) by mouth 2 (two) times daily., Disp: , Rfl:    ipratropium-albuterol (DUONEB) 0.5-2.5 (3) MG/3ML SOLN, Inhale 3 mLs into the lungs every 6 (six) hours as needed (wheezing)., Disp: , Rfl:    losartan (COZAAR) 50 MG tablet, Take 50 mg by mouth daily., Disp: , Rfl:    metolazone (ZAROXOLYN) 2.5 MG tablet, Take 1 tablet (2.5 mg total) by mouth 2 (two) times a week., Disp: , Rfl:    midodrine (PROAMATINE) 5 MG tablet, Take 1 tablet (5 mg total) by mouth 3 (three) times daily with meals. (Patient taking differently: Take 2.5 mg by mouth 3 (three) times daily with meals.), Disp: , Rfl:    omeprazole (PRILOSEC) 20 MG capsule, Take 20 mg by mouth daily., Disp: , Rfl:    potassium chloride SA (KLOR-CON M) 20 MEQ tablet, Take 1 tablet (20 mEq total) by mouth daily. With extra 2041mon M/ F with metolazone, Disp: 100 tablet, Rfl: 3   torsemide 60 MG TABS, Take 60 mg by mouth daily., Disp: 30 tablet, Rfl: 1    ALLERGIES   Patient has no known allergies.    REVIEW OF SYSTEMS    Unable to obtain ROS due to unresponsive mental status.  PHYSICAL EXAMINATION   Vital Signs: Temp:  [97.6 F (36.4 C)] 97.6 F (36.4 C) (02/14 0648) Pulse Rate:  [69-119] 69 (02/14 0853) Resp:  [15-22] 16 (02/14 1015) BP: (64-129)/(38-85) 93/55 (02/14 1015) SpO2:  [88 %-100 %] 96 % (02/14 0853) Weight:  [98.9 kg] 98.9 kg (02/14 0649)  GENERAL: Age-appropriate, unable to arouse patient with sternal rub no apparent distress HEAD: Normocephalic, atraumatic.   EYES: Pupils equal, round, reactive to light.  No scleral icterus.  MOUTH: Moist mucosal membrane. NECK: Supple. No thyromegaly. No nodules. No JVD.  PULMONARY: Rhonchorous breathing with crepitations bilaterally CARDIOVASCULAR: S1 and S2. Regular rate and rhythm. No murmurs, rubs, or gallops.  GASTROINTESTINAL: Soft, nontender, non-distended. No masses. Positive bowel sounds. No hepatosplenomegaly.  MUSCULOSKELETAL: Chronic stasis dermatitis with 2+ edema bilaterally NEUROLOGIC: GCS 6, poorly arousable SKIN:intact,warm,dry   PERTINENT DATA     Infusions:  norepinephrine (LEVOPHED) Adult infusion 18 mcg/min (01/29/23 1015)   Scheduled Medications:  PRN Medications: docusate sodium, polyethylene glycol Hemodynamic parameters:   Intake/Output: No intake/output data recorded.  Ventilator  Settings:    LAB RESULTS:  Basic Metabolic Panel: Recent Labs  Lab 01/28/23 1007 01/29/23 0702  NA 140 138  K 4.8 5.0  CL 93* 91*  CO2 31 31  GLUCOSE 141* 126*  BUN 133* 136*  CREATININE 5.41* 6.11*  CALCIUM 9.9 9.9   Liver Function Tests: Recent Labs  Lab 01/29/23 0702  AST 36  ALT 13  ALKPHOS 75  BILITOT 2.1*  PROT 7.1  ALBUMIN 3.7   No results for input(s): "LIPASE", "AMYLASE" in the last 168 hours. No results for input(s): "AMMONIA" in the last 168 hours. CBC: Recent Labs  Lab 01/29/23 0702  WBC 8.6  NEUTROABS 6.8  HGB 18.3*  HCT 59.8*  MCV 106.8*  PLT 80*   Cardiac Enzymes: No results for input(s): "CKTOTAL", "CKMB", "CKMBINDEX", "TROPONINI" in the last 168 hours. BNP: Invalid input(s): "POCBNP" CBG: No results for input(s): "GLUCAP" in the last 168 hours.     IMAGING RESULTS:  Imaging: DG Chest Portable 1 View  Result Date: 01/29/2023 CLINICAL DATA:  Shortness of breath. EXAM: PORTABLE CHEST 1 VIEW COMPARISON:  01/21/2022 FINDINGS: Leftward patient rotation. The cardio pericardial silhouette is enlarged. Vascular congestion noted with bibasilar  atelectasis/infiltrate and probable layering small bilateral effusions. The visualized bony structures of the thorax are unremarkable. Telemetry leads overlie the chest. IMPRESSION: Vascular congestion with bibasilar atelectasis/infiltrate and probable layering small bilateral effusions. Electronically Signed   By: Misty Stanley M.D.   On: 01/29/2023 08:38   @PROBHOSP$ @ DG Chest Portable 1 View  Result Date: 01/29/2023 CLINICAL DATA:  Shortness of breath. EXAM: PORTABLE CHEST 1 VIEW COMPARISON:  01/21/2022 FINDINGS: Leftward patient rotation. The cardio pericardial silhouette is enlarged. Vascular congestion noted with bibasilar atelectasis/infiltrate and probable layering small bilateral effusions. The visualized bony structures of the thorax are unremarkable. Telemetry leads overlie the chest. IMPRESSION: Vascular congestion with bibasilar atelectasis/infiltrate and probable layering small bilateral effusions. Electronically Signed   By: Misty Stanley M.D.   On: 01/29/2023 08:38     ASSESSMENT AND PLAN    -Multidisciplinary rounds held today   Circulatory shock -present on admission -Suspect this is cardiogenic due to history of CHF and cor pulmonale with recent discharge for acute CHF exacerbation.  Currently requiring Levophed support, judicious use of IV fluids due to CHF status -Will perform infectious workup for possible septic distributive shock including blood and respiratory cultures as well as urinalysis -follow ABG and LA -follow up cultures -Patient may require central access due to Levophed infusion -No signs of acute blood loss anemia -emperic ABX -consider stress dose steroids  Acute Hypoxic Respiratory Failure -Patient with loud rhonchorous breathing and congestion, CXR with pleural effusions and pulmonary edema.  Blood work with cardiac biomarkers elevated including BNP over 2499, and troponin 2351 suggestive of NSTEMI versus demand ischemia -Patient high risk for respiratory  arrest, poorly arousable and unable to protect own airway may require endotracheal intubation with mechanical ventilation -continue Bronchodilator Therapy -Unable to diurese patient due to circulatory shock -legionella UR ag Strep pneumo UR ag  Acute decompensated systolic and diastolic CHF with EF [id number] Moderate TR and AR -Diuresis as able, currently on levophed due to shock -oxygen as needed ICU telemetry monitoring   Renal Failure-ESRD - nephrology on case - patient needs renal replacement -appreciate input -follow chem 7 -follow UO -continue Foley Catheter-assess need daily    Severe Coagulopathy  -  patient uremic with trhombocytopenia and on ASA with supratheraputic INR.  - will need to improve coagulopathy with FFP/platelet transfusion    Toxic metabolic encephalopathy   Due to uremia and ESRD   - should improve with CRRT   - possible septic encephalopathy will obtain blood cultures    ID -continue IV abx as prescibed -follow up cultures  GI/Nutrition GI PROPHYLAXIS as indicated DIET-->TF's as tolerated Constipation protocol as indicated  ENDO - ICU hypoglycemic\Hyperglycemia protocol -check FSBS per protocol   ELECTROLYTES -  follow labs as needed -replace as needed -pharmacy consultation   DVT/GI PRX ordered -SCDs  TRANSFUSIONS AS NEEDED MONITOR FSBS ASSESS the need for LABS as needed   Critical care provider statement:   Total critical care time: 33 minutes   Performed by: Lanney Gins MD   Critical care time was exclusive of separately billable procedures and treating other patients.   Critical care was necessary to treat or prevent imminent or life-threatening deterioration.   Critical care was time spent personally by me on the following activities: development of treatment plan with patient and/or surrogate as well as nursing, discussions with consultants, evaluation of patient's response to treatment, examination of patient, obtaining  history from patient or surrogate, ordering and performing treatments and interventions, ordering and review of laboratory studies, ordering and review of radiographic studies, pulse oximetry and re-evaluation of patient's condition.    Ottie Glazier, M.D.  Pulmonary & Critical Care Medicine       This document was prepared using Dragon voice recognition software and may include unintentional dictation errors.    Ottie Glazier, M.D.  Division of [place]

## 2023-01-29 NOTE — ED Notes (Signed)
EDP Bradler notified in person of pt's elevated trop.

## 2023-01-29 NOTE — ED Notes (Addendum)
See triage note, pt in with fluid in legs & pt states maybe some in abdomen too, states this happened over last few months, states is on torsemide and has been taking it regularly; wrap to pt's L lower leg which he says is covering draining blister; family reports increased weakness; pt denies dizziness or nausea; pt reports already took his morning midodrine, 1+ pitting edema in both legs. Pt's resp currently reg/unlabored, skin dry, 20RR, and laying calmly on stretcher. Visitor remains at bedside. Have switched out pulse ox ends, cord and from monitor to dinamap and cannot get good waveform for SpO2 reading on pt. Pt's fingers cool; attempted on multiple fingers/thumb.

## 2023-01-29 NOTE — Care Plan (Signed)
  CRITICAL CARE PROGRESS NOTE  -Patient remains severely critically ill, with severe acidosis with hypoxemia and hypercapnia pH 7.16.  He is unresponsive with failure of BIPAP no levophed gtt in circulatory shock.  We have discussed goals of care with his girlfriend that knows him well and lives with him Tawni Carnes) but she is not POA and unable to provide consent.  Nephrology recommends dialysis due to severe AKI with creat >6. He is also wit coagulopathy due to thrombocytopenia, on asapirin uremia and elevated INR  Patient will need emergency endotracheal intubation, platelet transfusion and HD catheter.   Dr Juleen China and I have agreed to provide emergency consent.     Ottie Glazier, M.D.  Pulmonary & [place]

## 2023-01-29 NOTE — ED Notes (Signed)
Called lab to add on BNP; Yenni in lab states will do so now.

## 2023-01-29 NOTE — Progress Notes (Addendum)
[name]  ROUNDING NOTE   Subjective:   [name] was admitted to [facility] on 01/29/2023 for Shock circulatory [facility]) [R57.9]  Patient was last seen by Dr. Holley Raring on 10/31/2022 in the office.   Patient was admitted to [facility] from 12/6 to 12/13 for acute exacerbation of chronic congestive heart failure. Patient was placed on furosemide gtt and metolazone. Patient was discharged on torsemide 63m daily, metolazone 2.568mtwice a week, potassium chloride, and midodrine.   Patient had labs done on 2/13 (yesterday) where he was fount to be in acute renal failure and asked to proceed to the emergency department. Patient did complain of lower extremity edema but no shortness of breath.   Patient's domestic partner at bedside who assists with history taking. Patient claims to be taking all medications as prescribed. However he has been retaining fluid. Patient somnolent during encounter but did answer some questions and was alert.   Objective:  Vital signs in last 24 hours:  Temp:  [96.7 F (35.9 C)-97.6 F (36.4 C)] 96.7 F (35.9 C) (02/14 1147) Pulse Rate:  [62-119] 64 (02/14 1215) Resp:  [15-22] 19 (02/14 1215) BP: (64-129)/(38-85) 117/69 (02/14 1215) SpO2:  [86 %-100 %] 92 % (02/14 1215) Weight:  [98.9 kg] 98.9 kg (02/14 0649)  Weight change:  Filed Weights   01/29/23 0649  Weight: 98.9 kg    Intake/Output: No intake/output data recorded.   Intake/Output this shift:  No intake/output data recorded.  Physical Exam: General: Ill appearing  Head: Normocephalic, atraumatic. Moist oral mucosal membranes  Eyes: Anicteric, PERRL  Neck: +JVD  Lungs:  Bilateral crackles  Heart: Regular rate and rhythm  Abdomen:  Soft, nontender  Extremities:  + peripheral edema. NO cyanosis.   Neurologic: lethargic  Skin: No lesions  Access: none    Basic Metabolic Panel: Recent Labs  Lab 01/28/23 1007 01/29/23 0702  NA 140 138  K 4.8 5.0  CL 93* 91*  CO2 31 31   GLUCOSE 141* 126*  BUN 133* 136*  CREATININE 5.41* 6.11*  CALCIUM 9.9 9.9    Liver Function Tests: Recent Labs  Lab 01/29/23 0702  AST 36  ALT 13  ALKPHOS 75  BILITOT 2.1*  PROT 7.1  ALBUMIN 3.7   No results for input(s): "LIPASE", "AMYLASE" in the last 168 hours. No results for input(s): "AMMONIA" in the last 168 hours.  CBC: Recent Labs  Lab 01/29/23 0702  WBC 8.6  NEUTROABS 6.8  HGB 18.3*  HCT 59.8*  MCV 106.8*  PLT 80*    Cardiac Enzymes: No results for input(s): "CKTOTAL", "CKMB", "CKMBINDEX", "TROPONINI" in the last 168 hours.  BNP: Invalid input(s): "POCBNP"  CBG: No results for input(s): "GLUCAP" in the last 168 hours.  Microbiology: Results for orders placed or performed during the hospital encounter of 01/28/22  Resp Panel by RT-PCR (Flu A&B, Covid) Nasopharyngeal Swab     Status: None   Collection Time: 01/28/22  7:08 PM   Specimen: Nasopharyngeal Swab; Nasopharyngeal(NP) swabs in vial transport medium  Result Value Ref Range Status   SARS Coronavirus 2 by RT PCR NEGATIVE NEGATIVE Final    Comment: (NOTE) SARS-CoV-2 target nucleic acids are NOT DETECTED.  The SARS-CoV-2 RNA is generally detectable in upper respiratory specimens during the acute phase of infection. The lowest concentration of SARS-CoV-2 viral copies this assay can detect is 138 copies/mL. A negative result does not preclude SARS-Cov-2 infection and should not be used as the sole basis for treatment or other patient  management decisions. A negative result may occur with  improper specimen collection/handling, submission of specimen other than nasopharyngeal swab, presence of viral mutation(s) within the areas targeted by this assay, and inadequate number of viral copies(<138 copies/mL). A negative result must be combined with clinical observations, patient history, and epidemiological information. The expected result is Negative.  Fact Sheet for Patients:   EntrepreneurPulse.com.au  Fact Sheet for Healthcare Providers:  IncredibleEmployment.be  This test is no t yet approved or cleared by the [place] FDA and  has been authorized for detection and/or diagnosis of SARS-CoV-2 by FDA under an Emergency Use Authorization (EUA). This EUA will remain  in effect (meaning this test can be used) for the duration of the COVID-19 declaration under Section 564(b)(1) of the Act, 21 U.S.C.section 360bbb-3(b)(1), unless the authorization is terminated  or revoked sooner.       Influenza A by PCR NEGATIVE NEGATIVE Final   Influenza B by PCR NEGATIVE NEGATIVE Final    Comment: (NOTE) The Xpert Xpress SARS-CoV-2/FLU/RSV plus assay is intended as an aid in the diagnosis of influenza from Nasopharyngeal swab specimens and should not be used as a sole basis for treatment. Nasal washings and aspirates are unacceptable for Xpert Xpress SARS-CoV-2/FLU/RSV testing.  Fact Sheet for Patients: EntrepreneurPulse.com.au  Fact Sheet for Healthcare Providers: IncredibleEmployment.be  This test is not yet approved or cleared by the [place] FDA and has been authorized for detection and/or diagnosis of SARS-CoV-2 by FDA under an Emergency Use Authorization (EUA). This EUA will remain in effect (meaning this test can be used) for the duration of the COVID-19 declaration under Section 564(b)(1) of the Act, 21 U.S.C. section 360bbb-3(b)(1), unless the authorization is terminated or revoked.  Performed at [facility], [address] [place], [place] [address]   MRSA Next Gen by PCR, Nasal     Status: None   Collection Time: 01/28/22  8:47 PM   Specimen: Nasal Mucosa; Nasal Swab  Result Value Ref Range Status   MRSA by PCR Next Gen NOT DETECTED NOT DETECTED Final    Comment: (NOTE) The GeneXpert MRSA Assay (FDA approved for NASAL specimens only), is one component of a  comprehensive MRSA colonization surveillance program. It is not intended to diagnose MRSA infection nor to guide or monitor treatment for MRSA infections. Test performance is not FDA approved in patients less than 69 years old. Performed at [facility], [address] [place], [place] [address]     Coagulation Studies: Recent Labs    01/29/23 1108  LABPROT 16.5*  INR 1.3*    Urinalysis: No results for input(s): "COLORURINE", "LABSPEC", "PHURINE", "GLUCOSEU", "HGBUR", "BILIRUBINUR", "KETONESUR", "PROTEINUR", "UROBILINOGEN", "NITRITE", "LEUKOCYTESUR" in the last 72 hours.  Invalid input(s): "APPERANCEUR"    Imaging: DG Chest Portable 1 View  Result Date: 01/29/2023 CLINICAL DATA:  Shortness of breath. EXAM: PORTABLE CHEST 1 VIEW COMPARISON:  01/21/2022 FINDINGS: Leftward patient rotation. The cardio pericardial silhouette is enlarged. Vascular congestion noted with bibasilar atelectasis/infiltrate and probable layering small bilateral effusions. The visualized bony structures of the thorax are unremarkable. Telemetry leads overlie the chest. IMPRESSION: Vascular congestion with bibasilar atelectasis/infiltrate and probable layering small bilateral effusions. Electronically Signed   By: Misty Stanley M.D.   On: 01/29/2023 08:38     Medications:    furosemide (LASIX) 200 mg in dextrose 5 % 100 mL (2 mg/mL) infusion     norepinephrine (LEVOPHED) Adult infusion 20 mcg/min (01/29/23 1217)   piperacillin-tazobactam (ZOSYN)  IV  piperacillin-tazobactam     vancomycin      [START ON 01/30/2023] vancomycin variable dose per unstable renal function (pharmacist dosing)   Does not apply See admin instructions   docusate sodium, polyethylene glycol  Assessment/ Plan:  [name] is a 77 y.o. black male with chronic diastolic congestive heart failure, hypertension, COPD, hyperlipidemia, who is admitted to [facility] on 01/29/2023 for Shock circulatory ([place]) [R57.9]  Acute  Kidney Injury on chronic kidney disease stage IV: baseline creatinine of 2.67, GFR of 24 on 01/06/23. History of bland urine. Acute kidney injury most likely secondary to cardiorenal syndrome. Chronic kidney disease secondary to hypertension. Low threshold for renal replacement therapy due to metabolic acidosis. Discussed case with ICU team.   Acute exacerbation of chronic diastolic congestive heart failure: echocardiogram on 10/02/2022.    Hypotension with cardiogenic shock requiring vasopressors: norepinephrine gtt. Appreciate cardiology and pulmonary input.    LOS: 0 Maurie Olesen 2/14/202412:35 PM

## 2023-01-29 NOTE — Procedures (Signed)
Central Venous Catheter Placement:  TRIPLE LUMEN for HEMODIALYSIS     Procedure: Insertion of Non-tunneled Central Venous Catheter(36556) with US guidance [id number])    Indication(s) Medication administration and Difficult access  CVP monitoring Patient receiving vesicant or irritant drug.; Patient receiving intravenous therapy for longer than 5 days.; Patient has limited or no vascular access.      Consent Risks of the procedure as well as the alternatives and risks of each were explained to the patient and/or caregiver.  Consent for the procedure was obtained and is signed in the bedside chart   Consent-verbal/written         Timeout Verified patient identification, verified procedure, site/side was marked, verified correct patient position, special equipment/implants available, medications/allergies/relevant history reviewed, required imaging and test results available. Patient comfort was obtained.     Sterile Technique Maximal sterile technique including full sterile barrier drape, hand hygiene, sterile gown, sterile gloves, mask, hair covering, sterile ultrasound probe cover (if used).   Hand washing performed prior to starting the procedure.      Procedure Description Area of catheter insertion was cleaned with chlorhexidine and draped in sterile fashion.  With real-time ultrasound guidance a central venous catheter was placed into the right internal jugular vein. Nonpulsatile blood flow and easy flushing noted in all ports.  The catheter was sutured in place and sterile dressing applied.   A triple lumen catheter was placed in LEFT FEMORAL Vein There was good blood return, catheter caps were placed on lumens, catheter flushed easily, the line was secured and a sterile dressing and BIO-PATCH applied.      Complications/Tolerance None; patient tolerated the procedure well.     EBL Minimal   Specimen(s) None     Number of Attempts: 1 Complications:none Estimated  Blood Loss: none    Ottie Glazier, M.D.  Pulmonary & [place]

## 2023-01-29 NOTE — ED Notes (Signed)
Called resp therapy for eval of pt. RT to bedside to eval pt. EDP Bradler notified of pt's continued low BP.

## 2023-01-29 NOTE — ED Notes (Signed)
New non-adhesive dressing applied to pt's L lower leg with gauze around it.

## 2023-01-29 NOTE — ED Notes (Signed)
Pt 82-88% RA; pt's family states he is usually in high 110s; pt placed back on 2L via [place] while asleep.

## 2023-01-29 NOTE — ED Notes (Signed)
IV team at bedside. Float RN Elana will take pt up to ICU room 4 once IV team done.

## 2023-01-29 NOTE — ED Notes (Signed)
EDP Bradler notified of critical trop via secure chat since currently in another pt's room.

## 2023-01-29 NOTE — ED Notes (Signed)
IV team to bedside. 

## 2023-01-29 NOTE — Progress Notes (Signed)
Patient accepted from ED.  Assessment completed, see flowsheets.  Dr. Lanney Gins notified of admission to ICU bed 8.

## 2023-01-29 NOTE — Progress Notes (Signed)
Pharmacy Antibiotic Note  John [name] is a 77 y.o. male admitted on 01/29/2023 with sepsis.  Pharmacy has been consulted for vancomycin dosing.  Transitioning to CRRT. Planned effluent rate of 25 mL/hr   Plan: Vancomycin 2,000 mg loading dose then Vancomycin 1250 mg every 24 hours (10-15 mg/kg every 24 hr for CRRT)  Height: 5' 11"$  (180.3 cm) Weight: 98.9 kg (218 lb) IBW/kg (Calculated) : 75.3  Temp (24hrs), Avg:97.3 F (36.3 C), Min:96.7 F (35.9 C), Max:97.6 F (36.4 C)  Recent Labs  Lab 01/28/23 1007 01/29/23 0702  WBC  --  8.6  CREATININE 5.41* 6.11*    Estimated Creatinine Clearance: 12.1 mL/min (A) (by C-G formula based on SCr of 6.11 mg/dL (H)).    No Known Allergies  Antimicrobials this admission: vancomycin 2/14 >>  Zosyn 2/14 >>   Dose adjustments this admission: N/a  Microbiology results: 2/14 BCx: in process 2/14 trach aspirate: ordered 2/14 MRSA PCR: ordered  Thank you for allowing pharmacy to be a part of this patient's care.  Wynelle Cleveland 01/29/2023 3:44 PM

## 2023-01-29 NOTE — Discharge Instructions (Signed)

## 2023-01-29 NOTE — ED Notes (Addendum)
Attempted use of nasal pulse ox end as well as portable pulse ox system and still cannot get waveform or regular waveform on monitor or dinamap. Nasal pulse ox attempted at nose and ear without waveform either. Pt placed on 2L O2 via [place]; family states pt's O2 usually in 80's and that he does use O2 at home as needed; pt falls asleep when not being talked to; pt easily woken; family/pt deny that he uses CPAP at night.

## 2023-01-29 NOTE — ED Notes (Signed)
IV done placing line; Elana RN pulling set of blood cultures to send to lab.

## 2023-01-29 NOTE — ED Notes (Signed)
Phlebotomist reported tried x2 for blood cultures but was unable to obtain them. Was able to collect other blood work. Attending A. Fuad notified via secure chat.

## 2023-01-29 NOTE — ED Notes (Signed)
Called lab to get phlebotomy's assistance with collecting blood work as each time IV placed blood will not pull back enough in line for collection.

## 2023-01-29 NOTE — ED Notes (Signed)
Paged attending F.A. MD to notify unable to collect blood cultures; awaiting reply/orders.

## 2023-01-29 NOTE — Consult Note (Signed)
Consultation Note Date: 01/29/2023 at 1500  Patient Name: John [name]  DOB: 12/22/1945  MRN: [id number]  Age / Sex: 77 y.o., male  PCP: John Graff, FNP Referring Physician: Ottie Glazier, MD  Reason for Consultation: Establishing goals of care  HPI/Patient Profile: 76 y.o. male  with past medical history of essential hypertension, on hydralazine and Cozaar, stage IV CKD but has not been on dialysis in the past, has chronic diastolic and systolic heart failure with cor pulmonale, moderate tricuspid regurgitation and aortic valve regurgitation, history of an ascending aortic aneurysm, CAD with atherosclerosis, COPD and chronic hypoxemia baseline 2 L/min supplemental oxygen at home with recurrent episodes of hypoxemia and remote AECOPD with hypoxemia and hypercapnia 2 months ago, history of ruptured left quadricep tendon 2 years ago, chronic thrombocytopenia and erythrocytosis, recurrent lower extremity cellulitis, recurrent hyperkalemia, morbid obesity history of anasarca dyslipidemia, recent admission for acute CHF exacerbation requiring lasix drip and BiPAP support, who was admitted on 01/29/2023 with AMS and circulatory shock.   Clinical Assessment and Goals of Care: I have reviewed medical records including EPIC notes, labs and imaging, and assessed the patient. He has just been intubated.  I received updates from bedside RN John [name].   After assessing the patient, I spoke with patient's significant other John [name] over the phone.  I attempted to discuss diagnosis prognosis, GOC, EOL wishes, disposition and options.  I introduced Palliative Medicine as specialized medical care for people living with serious illness. It focuses on providing relief from the symptoms and stress of a serious illness. The goal is to improve quality of life for both the patient and the family.  I attempted to discuss a brief life  review of the patient.  However, John [name] shares she is too tired to "think straight".  John [name] shares that John [name] was born in [place] and that she has no idea where any of his family is. She states he has a son that he has not been in contact with for 20+ years. John [name] has no name/phone numbers of any family members/NOK to contact.   Next of kin decision maker discussed.  John [name] shares she does not want to make any decisions for John [name].  She says she will be by to support him and visit with him but is not prepared to make any medical decisions on his behalf.  I notified TOC that decision maker needs to be determined. Once decision maker is determined [place] discussions can continue.   Primary Decision Maker OTHER - TBD  Physical Exam Vitals reviewed.  Constitutional:      Comments: Intubated and sedated  Cardiovascular:     Rate and Rhythm: Normal rate.  Pulmonary:     Comments: MV Skin:    General: Skin is dry.     Palliative Assessment/Data: 10%     Thank you for this consult. Palliative medicine will continue to follow and assist holistically.   Time Total: 75 minutes Greater than 50%  of this time was spent counseling and coordinating care related to  the above assessment and plan.  Signed by: John Hawks, DNP, FNP-BC Palliative Medicine    Please contact Palliative Medicine Team phone at [phone] for questions and concerns.  For individual provider: See John [name]

## 2023-01-29 NOTE — ED Triage Notes (Signed)
Pt to triage via w/c with no distress noted; pt poor historian; accomp by SO who reports pt with swelling to LE(s) and abd "for awhile"; st seen his cardiologist yesterday who referred him back to his nephrologist who recommended he present to the ED for evaluation; st hx kidney failure; pt reports recent weakness but denies pain or [facility]

## 2023-01-29 NOTE — Consult Note (Signed)
Pharmacy Antibiotic Note  John [name] is a 77 y.o. male admitted on 01/29/2023 with sepsis.  Pharmacy has been consulted for vancomycin and Zosyn dosing.  Plan: Zosyn 3.375 grams IV x over 30 min.  Then start Zosyn 2.25 grams IV every 8 hours (4 hour infusion) Give vancomycin 2000 mg IV x 1, then dose based off levels due to AKI. Will order random level for 01/30/23 @ 1200 Follow renal function for adjustments  Height: 5' 11"$  (180.3 cm) Weight: 98.9 kg (218 lb) IBW/kg (Calculated) : 75.3  Temp (24hrs), Avg:97.6 F (36.4 C), Min:97.6 F (36.4 C), Max:97.6 F (36.4 C)  Recent Labs  Lab 01/28/23 1007 01/29/23 0702  WBC  --  8.6  CREATININE 5.41* 6.11*    Estimated Creatinine Clearance: 12.1 mL/min (A) (by C-G formula based on SCr of 6.11 mg/dL (H)).    No Known Allergies  Antimicrobials this admission: Vancomycin 2/14 >>  Zosyn 2/14 >>   Dose adjustments this admission: N/A  Microbiology results: 2/14 BCx: ordered 2/14 Sputum: ordered  2/14 MRSA PCR: ordered  Thank you for allowing pharmacy to be a part of this patient's care.  Lorin Picket, PharmD 01/29/2023 11:19 AM

## 2023-01-29 NOTE — ED Notes (Signed)
Pt repositioned again.

## 2023-01-29 NOTE — ED Notes (Signed)
Phlebotomist states will send 2nd staff member for blood collection as attempted twice without success.

## 2023-01-29 NOTE — ED Notes (Signed)
This RN to bedside, attempted IV insertion left upper arm. Unsuccessful, swelling when attempting to flush. IV team RN to bedside, assessed IV below attempt, and states she believe it is patent, and not affecting attempted insertion site. Primary RN Metta Clines notified to monitor attempted insertion site, and existing IV in [facility].

## 2023-01-29 NOTE — ED Notes (Signed)
2 fresh warm blankets applied to pt; has 3 blankets total.

## 2023-01-29 NOTE — ED Notes (Signed)
Attending F.A. MD notified of critical BNP result.

## 2023-01-30 ENCOUNTER — Inpatient Hospital Stay: Payer: Medicare HMO

## 2023-01-30 ENCOUNTER — Inpatient Hospital Stay (HOSPITAL_COMMUNITY)
Admit: 2023-01-30 | Discharge: 2023-01-30 | Disposition: A | Discharge status: Home / Self Care | Payer: Medicare HMO | Attending: Pulmonary Disease | Admitting: Pulmonary Disease

## 2023-01-30 DIAGNOSIS — R7989 Other specified abnormal findings of blood chemistry: Secondary | ICD-10-CM | POA: Diagnosis not present

## 2023-01-30 DIAGNOSIS — R579 Shock, unspecified: Secondary | ICD-10-CM | POA: Diagnosis not present

## 2023-01-30 DIAGNOSIS — N179 Acute kidney failure, unspecified: Secondary | ICD-10-CM | POA: Diagnosis not present

## 2023-01-30 DIAGNOSIS — R609 Edema, unspecified: Secondary | ICD-10-CM | POA: Diagnosis not present

## 2023-01-30 DIAGNOSIS — I5041 Acute combined systolic (congestive) and diastolic (congestive) heart failure: Secondary | ICD-10-CM | POA: Diagnosis not present

## 2023-01-30 DIAGNOSIS — I509 Heart failure, unspecified: Secondary | ICD-10-CM | POA: Diagnosis not present

## 2023-01-30 LAB — ECHOCARDIOGRAM COMPLETE
AR max vel: 3.22 cm2
AV Area VTI: 3.79 cm2
AV Area mean vel: 4.15 cm2
AV Mean grad: 1 mmHg
AV Peak grad: 3.9 mmHg
Ao pk vel: 0.99 m/s
Area-P 1/2: 2.1 cm2
Height: 71 in
MV VTI: 2.38 cm2
S' Lateral: 3 cm
Weight: 3573.22 oz

## 2023-01-30 LAB — GLUCOSE, CAPILLARY
Glucose-Capillary: 88 mg/dL (ref 70–99)
Glucose-Capillary: 128 mg/dL — ABNORMAL HIGH (ref 70–99)
Glucose-Capillary: 129 mg/dL — ABNORMAL HIGH (ref 70–99)
Glucose-Capillary: 101 mg/dL — ABNORMAL HIGH (ref 70–99)
Glucose-Capillary: 143 mg/dL — ABNORMAL HIGH (ref 70–99)
Glucose-Capillary: 127 mg/dL — ABNORMAL HIGH (ref 70–99)

## 2023-01-30 LAB — RENAL FUNCTION PANEL
Albumin: 3.3 g/dL — ABNORMAL LOW (ref 3.5–5.0)
Albumin: 3 g/dL — ABNORMAL LOW (ref 3.5–5.0)
Anion gap: 13 (ref 5–15)
Anion gap: 10 (ref 5–15)
BUN: 107 mg/dL — ABNORMAL HIGH (ref 8–23)
BUN: 78 mg/dL — ABNORMAL HIGH (ref 8–23)
CO2: 28 mmol/L (ref 22–32)
CO2: 25 mmol/L (ref 22–32)
Calcium: 9.2 mg/dL (ref 8.9–10.3)
Calcium: 9.1 mg/dL (ref 8.9–10.3)
Chloride: 96 mmol/L — ABNORMAL LOW (ref 98–111)
Chloride: 99 mmol/L (ref 98–111)
Creatinine, Ser: 4.93 mg/dL — ABNORMAL HIGH (ref 0.61–1.24)
Creatinine, Ser: 3.7 mg/dL — ABNORMAL HIGH (ref 0.61–1.24)
GFR, Estimated: 11 mL/min — ABNORMAL LOW (ref >60)
GFR, Estimated: 16 mL/min — ABNORMAL LOW (ref >60)
Glucose, Bld: 123 mg/dL — ABNORMAL HIGH (ref 70–99)
Glucose, Bld: 132 mg/dL — ABNORMAL HIGH (ref 70–99)
Phosphorus: 6.1 mg/dL — ABNORMAL HIGH (ref 2.5–4.6)
Phosphorus: 5.3 mg/dL — ABNORMAL HIGH (ref 2.5–4.6)
Potassium: 4.7 mmol/L (ref 3.5–5.1)
Potassium: 4.2 mmol/L (ref 3.5–5.1)
Sodium: 137 mmol/L (ref 135–145)
Sodium: 134 mmol/L — ABNORMAL LOW (ref 135–145)

## 2023-01-30 LAB — BPAM PLATELET PHERESIS
Blood Product Expiration Date: 202402162359
ISSUE DATE / TIME: 202402141645
Unit Type and Rh: 7300

## 2023-01-30 LAB — BLOOD GAS, ARTERIAL
Acid-Base Excess: 1.2 mmol/L (ref 0.0–2.0)
Bicarbonate: 29.3 mmol/L — ABNORMAL HIGH (ref 20.0–28.0)
MECHVT: 450 mL
Mechanical Rate: 15
O2 Saturation: 95.1 %
PEEP: 5 cmH2O
Patient temperature: 37
pCO2 arterial: 61 mmHg — ABNORMAL HIGH (ref 32–48)
pH, Arterial: 7.29 — ABNORMAL LOW (ref 7.35–7.45)
pO2, Arterial: 72 mmHg — ABNORMAL LOW (ref 83–108)

## 2023-01-30 LAB — HEPATIC FUNCTION PANEL
ALT: 12 U/L (ref 0–44)
AST: 23 U/L (ref 15–41)
Albumin: 3 g/dL — ABNORMAL LOW (ref 3.5–5.0)
Alkaline Phosphatase: 50 U/L (ref 38–126)
Bilirubin, Direct: 1.2 mg/dL — ABNORMAL HIGH (ref 0.0–0.2)
Indirect Bilirubin: 3.1 mg/dL — ABNORMAL HIGH (ref 0.3–0.9)
Total Bilirubin: 4.3 mg/dL — ABNORMAL HIGH (ref 0.3–1.2)
Total Protein: 5.8 g/dL — ABNORMAL LOW (ref 6.5–8.1)

## 2023-01-30 LAB — CBC
HCT: 56 % — ABNORMAL HIGH (ref 39.0–52.0)
Hemoglobin: 17.5 g/dL — ABNORMAL HIGH (ref 13.0–17.0)
MCH: 32.7 pg (ref 26.0–34.0)
MCHC: 31.3 g/dL (ref 30.0–36.0)
MCV: 104.7 fL — ABNORMAL HIGH (ref 80.0–100.0)
Platelets: 78 10*3/uL — ABNORMAL LOW (ref 150–400)
RBC: 5.35 MIL/uL (ref 4.22–5.81)
RDW: 16.4 % — ABNORMAL HIGH (ref 11.5–15.5)
WBC: 11.4 10*3/uL — ABNORMAL HIGH (ref 4.0–10.5)
nRBC: 0.3 % — ABNORMAL HIGH (ref 0.0–0.2)

## 2023-01-30 LAB — APTT: aPTT: 45 seconds — ABNORMAL HIGH (ref 24–36)

## 2023-01-30 LAB — PREPARE PLATELET PHERESIS: Unit division: 0

## 2023-01-30 LAB — POTASSIUM: Potassium: 4.6 mmol/L (ref 3.5–5.1)

## 2023-01-30 LAB — STREP PNEUMONIAE URINARY ANTIGEN: Strep Pneumo Urinary Antigen: NEGATIVE

## 2023-01-30 LAB — MAGNESIUM: Magnesium: 2.3 mg/dL (ref 1.7–2.4)

## 2023-01-30 LAB — FIBRINOGEN: Fibrinogen: 201 mg/dL — ABNORMAL LOW (ref 210–475)

## 2023-01-30 MED ORDER — SODIUM CHLORIDE 0.9 % IV SOLN
3.0000 g | Freq: Three times a day (TID) | INTRAVENOUS | Status: DC
  Administered 2023-01-30 – 2023-01-31 (×3): 3 g via INTRAVENOUS
  Filled 2023-01-30: qty 8
  Filled 2023-01-30 (×2): qty 3
  Filled 2023-01-30: qty 8

## 2023-01-30 MED ORDER — SODIUM CHLORIDE 0.9 % IV SOLN
500.0000 [IU]/h | INTRAVENOUS | Status: DC
  Administered 2023-01-30 – 2023-02-03 (×9): 500 [IU]/h via INTRAVENOUS_CENTRAL
  Filled 2023-01-30 (×10): qty 2

## 2023-01-30 MED ORDER — PROSOURCE TF20 ENFIT COMPATIBL EN LIQD
60.0000 mL | Freq: Two times a day (BID) | ENTERAL | Status: DC
  Administered 2023-01-31 – 2023-02-05 (×10): 60 mL
  Filled 2023-01-30 (×11): qty 60

## 2023-01-30 MED ORDER — FENTANYL BOLUS VIA INFUSION
50.0000 ug | INTRAVENOUS | Status: DC | PRN
  Administered 2023-01-30: 50 ug via INTRAVENOUS
  Administered 2023-01-30: 25 ug via INTRAVENOUS
  Administered 2023-01-31: 50 ug via INTRAVENOUS

## 2023-01-30 MED ORDER — HEPARIN (PORCINE) 25000 UT/250ML-% IV SOLN: 1300.0000 [IU]/h | INTRAVENOUS | Status: DC

## 2023-01-30 MED ORDER — DEXMEDETOMIDINE HCL IN NACL 400 MCG/100ML IV SOLN
0.0000 ug/kg/h | INTRAVENOUS | Status: DC
  Administered 2023-01-30: 0.4 ug/kg/h via INTRAVENOUS
  Filled 2023-01-30: qty 100

## 2023-01-30 MED ORDER — IOHEXOL 350 MG/ML SOLN
75.0000 mL | Freq: Once | INTRAVENOUS | Status: AC | PRN
  Administered 2023-01-30: 75 mL via INTRAVENOUS

## 2023-01-30 MED ORDER — VITAL AF 1.2 CAL PO LIQD
1000.0000 mL | ORAL | Status: DC
  Administered 2023-01-30 – 2023-01-31 (×3): 1000 mL

## 2023-01-30 MED ORDER — HEPARIN BOLUS VIA INFUSION
4000.0000 [IU] | Freq: Once | INTRAVENOUS | Status: DC
  Filled 2023-01-30: qty 4000

## 2023-01-30 MED ORDER — DEXMEDETOMIDINE HCL IN NACL 400 MCG/100ML IV SOLN
INTRAVENOUS | Status: AC
  Administered 2023-01-30: 0.4 ug/kg/h via INTRAVENOUS
  Filled 2023-01-30: qty 100

## 2023-01-30 MED ORDER — SODIUM CHLORIDE 0.9 % IV SOLN
250.0000 mL | INTRAVENOUS | Status: DC
  Administered 2023-01-30: 250 mL via INTRAVENOUS

## 2023-01-30 MED ORDER — JUVEN PO PACK
1.0000 | PACK | Freq: Two times a day (BID) | ORAL | Status: DC
  Administered 2023-01-31 – 2023-02-05 (×8): 1

## 2023-01-30 MED ORDER — RENA-VITE PO TABS
1.0000 | ORAL_TABLET | Freq: Every day | ORAL | Status: DC
  Administered 2023-01-30 – 2023-02-04 (×6): 1
  Filled 2023-01-30 (×6): qty 1

## 2023-01-30 MED ORDER — FREE WATER
30.0000 mL | Status: DC
  Administered 2023-01-30 – 2023-02-05 (×36): 30 mL

## 2023-01-30 MED ORDER — SODIUM CHLORIDE 0.9 % IV SOLN: INTRAVENOUS | Status: DC | PRN

## 2023-01-30 NOTE — Progress Notes (Signed)
Initial Nutrition Assessment  DOCUMENTATION CODES:   Not applicable  INTERVENTION:   Vital 1.2'@60ml'$ /hr- Initiate at 15m/hr, once pt is more stable, advance by 126mhr q 8 hours until goal rate is reached.   ProSource TF 20- Give 6065mID via tube, each supplement provides 80kcal and 20g of protein.   Free water flushes 65m64m hours to maintain tube patency   Regimen provides 1888kcal/day, 148g/day protein 1348ml39m of free water.   Rena-vit daily via tube   Juven Fruit Punch BID via tube, each serving provides 95kcal and 2.5g of protein (amino acids glutamine and arginine)  Daily weights   NUTRITION DIAGNOSIS:   Inadequate oral intake related to inability to eat (pt sedated and ventilated) as evidenced by NPO status.  GOAL:   Provide needs based on ASPEN/SCCM guidelines  MONITOR:   Vent status, Labs, Weight trends, TF tolerance, I & O's, Skin  REASON FOR ASSESSMENT:   Ventilator    ASSESSMENT:   76 y/35male with h/o COPD, CHF, HTN, CKD IV and HLD who is admitted with circulatory shock, CHF, encephalopaty and new ESRD.  Pt sedated and ventilated. OGT has been advanced; KUB is pending. Pt with resolving shock, pressor requirements decreasing and MAP is improved. Will plan to initiate trickle tube feeds today and advance to goal rate tomorrow if pt is tolerating. Plan is for CRRT initiation today. Per chart, pt is up ~17lbs from his UBW; pt is noted to have anasarca. Pt does appears weight stable at baseline.    Medications reviewed and include: colace, protonix, miralax, unasyn , heparin, levophed, vasopressin   Labs reviewed: K 4.7 wnl, BUN 107(H), creat 4.93(H), P 6.1(H), Mg 2.3 wnl BNP- 2474(H)- 2/14 Wbc- 11.4(H) Cbgs- 129, 128, 88 x 24 hrs AIC 6.3(H)- 10/23  Patient is currently intubated on ventilator support MV: 7.6 L/min Temp (24hrs), Avg:96.9 F (36.1 C), Min:93.2 F (34 C), Max:98.4 F (36.9 C)  Propofol: none   MAP- >65mmH60mUOP- 485ml  40mTRITION - FOCUSED PHYSICAL EXAM:  Flowsheet Row Most Recent Value  Orbital Region No depletion  Upper Arm Region Moderate depletion  Thoracic and Lumbar Region No depletion  Buccal Region No depletion  Temple Region Moderate depletion  Clavicle Bone Region No depletion  Clavicle and Acromion Bone Region No depletion  Scapular Bone Region No depletion  Dorsal Hand No depletion  Patellar Region Moderate depletion  Anterior Thigh Region Moderate depletion  Posterior Calf Region Moderate depletion  Edema (RD Assessment) Severe  Hair Reviewed  Eyes Reviewed  Mouth Reviewed  Skin Reviewed  Nails Reviewed   Diet Order:   Diet Order             Diet NPO time specified  Diet effective now                  EDUCATION NEEDS:   No education needs have been identified at this time  Skin:  Skin Assessment: Reviewed RN Assessment (NPI L leg)  Last BM:  pta  Height:   Ht Readings from Last 1 Encounters:  01/29/23 '5\' 11"'$  (1.803 m)    Weight:   Wt Readings from Last 1 Encounters:  01/30/23 101.3 kg    Ideal Body Weight:  78 kg  BMI:  Body mass index is 31.15 kg/m.  Estimated Nutritional Needs:   Kcal:  1806kcal/day  Protein:  130-150g/day  Fluid:  2.0L/day  Kathren Scearce CKoleen Distance, LDN Please refer to [facility] and/or RD  on-call/weekend/after hours pager

## 2023-01-30 NOTE — Progress Notes (Signed)
Patient transported to/from CT without complication.

## 2023-01-30 NOTE — Progress Notes (Signed)
*  PRELIMINARY RESULTS* Echocardiogram 2D Echocardiogram has been performed.  John [name] 01/30/2023, 2:39 PM

## 2023-01-30 NOTE — Progress Notes (Signed)
[place] for heparin initiation and monitoring Indication: chest pain/ACS  No Known Allergies  Patient Measurements: Height: 5' 11"$  (180.3 cm) Weight: 101.3 kg (223 lb 5.2 oz) IBW/kg (Calculated) : 75.3 Heparin Dosing Weight: 95.6 kg  Vital Signs: Temp: 96.4 F (35.8 C) (02/15 1715) Temp Source: Rectal (02/15 1600) BP: 95/69 (02/15 1715) Pulse Rate: 54 (02/15 1715)  Labs: Recent Labs    01/29/23 0702 01/29/23 1046 01/29/23 1108 01/29/23 2046 01/29/23 2227 01/29/23 2250 01/30/23 0455 01/30/23 1612  HGB 18.3*  --   --   --  16.8  --  17.5*  --   HCT 59.8*  --   --   --  54.3*  --  56.0*  --   PLT 80*  --   --   --  91*  --  78*  --   APTT  --   --  32  --   --   --  45*  --   LABPROT  --   --  16.5*  --   --   --   --   --   INR  --   --  1.3*  --   --   --   --   --   CREATININE 6.11*  --   --  6.14*  --   --  4.93* 3.70*  TROPONINIHS 2,351* 2,252*  --   --   --  1,834*  --   --     Estimated Creatinine Clearance: 20.3 mL/min (A) (by C-G formula based on SCr of 3.7 mg/dL (H)).   Medical History: Past Medical History:  Diagnosis Date   CHF (congestive heart failure) (HCC)    EF 45% in 2021   Chronic kidney disease    COPD (chronic obstructive pulmonary disease) (HCC) 02/16/2020   Hyperlipidemia    Hypertension    Pneumonia 2021    Medications:  Scheduled:   Chlorhexidine Gluconate Cloth  6 each Topical Daily   docusate  100 mg Per Tube BID   [START ON 01/31/2023] feeding supplement (PROSource TF20)  60 mL Per Tube BID   feeding supplement (VITAL AF 1.2 CAL)  1,000 mL Per Tube Q24H   free water  30 mL Per Tube Q4H   multivitamin  1 tablet Per Tube QHS   [START ON 01/31/2023] nutrition supplement (JUVEN)  1 packet Per Tube BID BM   mouth rinse  15 mL Mouth Rinse Q2H   pantoprazole (PROTONIX) IV  40 mg Intravenous Daily   polyethylene glycol  17 g Per Tube Daily    Assessment: 77 y/o male with h/o COPD, CHF, HTN, CKD  IV and HLD who is admitted with circulatory shock, CHF, encephalopaty and new ESRD on CRRT. Patient sedated and ventilated. No chronic anticoagulation  Goal of Therapy:  Heparin level 0.3-0.7 units/ml Monitor platelets by anticoagulation protocol: Yes   Plan:  Give 4000 units bolus x 1 Start heparin infusion at 1300 units/hr Check anti-Xa level in 8 hours and daily while on heparin Continue to monitor H&H and platelets  Dallie Piles 01/30/2023,5:26 PM

## 2023-01-30 NOTE — Progress Notes (Signed)
[name]  ROUNDING NOTE   Subjective:   [name] was admitted to [facility] on 01/29/2023 for Peripheral edema [R60.9] Shock circulatory ([place]) [R57.9] Acute on chronic congestive heart failure, unspecified heart failure type ([place]) [I50.9] Acute renal failure superimposed on chronic kidney disease, unspecified acute renal failure type, unspecified CKD stage ([place]) [N17.9, N18.9]  Placed on CRRT yesterday.   Norepinephrine and vasopressin  Patient without any direct relatives available.   Objective:  Vital signs in last 24 hours:  Temp:  [93.2 F (34 C)-98.4 F (36.9 C)] 96.1 F (35.6 C) (02/15 1115) Pulse Rate:  [30-75] 34 (02/15 1115) Resp:  [12-30] 16 (02/15 1115) BP: (72-163)/(50-101) 163/69 (02/15 1100) SpO2:  [87 %-100 %] 92 % (02/15 1121) Arterial Line BP: (81-155)/(46-83) 96/46 (02/15 1115) FiO2 (%):  [35 %-50 %] 35 % (02/15 1121) Weight:  [101.3 kg] 101.3 kg (02/15 0157)  Weight change: 2.416 kg Filed Weights   01/29/23 0649 01/30/23 0157  Weight: 98.9 kg 101.3 kg    Intake/Output: I/O last 3 completed shifts: In: 1930.1 [I.V.:1428.8; IV Piggyback:501.3] Out: 718 [Urine:485]   Intake/Output this shift:  Total I/O In: 312.5 [I.V.:282.5; Other:30] Out: 38 [Urine:140; Emesis/NG output:110]  Physical Exam: General: Critically ill  Head: ETT  Eyes: Anicteric, PERRL  Neck: +JVD  Lungs:  Bilateral crackles, PRVC 35%  Heart: Regular rate and rhythm  Abdomen:  Soft, nontender  Extremities:  + peripheral edema.    Neurologic: Intubated and sedated  Skin: No lesions  Access: Left femoral Temp HD catheter placed 2/14.     Basic Metabolic Panel: Recent Labs  Lab 01/28/23 1007 01/29/23 0702 01/29/23 2046 01/29/23 2358 01/30/23 0455  NA 140 138 134*  --  137  K 4.8 5.0 5.5* 4.6 4.7  CL 93* 91* 92*  --  96*  CO2 31 31 27  $ --  28  GLUCOSE 141* 126* 149*  --  123*  BUN 133* 136* 129*  --  107*  CREATININE 5.41* 6.11* 6.14*  --  4.93*   CALCIUM 9.9 9.9 9.1  --  9.2  MG  --   --  2.5*  --  2.3  PHOS  --   --  8.5*  --  6.1*     Liver Function Tests: Recent Labs  Lab 01/29/23 0702 01/29/23 2046 01/30/23 0455  AST 36  --   --   ALT 13  --   --   ALKPHOS 75  --   --   BILITOT 2.1*  --   --   PROT 7.1  --   --   ALBUMIN 3.7 3.3* 3.3*    No results for input(s): "LIPASE", "AMYLASE" in the last 168 hours. No results for input(s): "AMMONIA" in the last 168 hours.  CBC: Recent Labs  Lab 01/29/23 0702 01/29/23 2227 01/30/23 0455  WBC 8.6 11.4* 11.4*  NEUTROABS 6.8  --   --   HGB 18.3* 16.8 17.5*  HCT 59.8* 54.3* 56.0*  MCV 106.8* 105.2* 104.7*  PLT 80* 91* 78*     Cardiac Enzymes: No results for input(s): "CKTOTAL", "CKMB", "CKMBINDEX", "TROPONINI" in the last 168 hours.  BNP: Invalid input(s): "POCBNP"  CBG: Recent Labs  Lab 01/29/23 1939 01/29/23 2331 01/30/23 0309 01/30/23 0738 01/30/23 1124  GLUCAP 143* 115* 88 128* 129*    Microbiology: Results for orders placed or performed during the hospital encounter of 01/29/23  Culture, blood (Routine X 2) w Reflex to ID Panel  Status: None (Preliminary result)   Collection Time: 01/29/23  1:10 PM   Specimen: BLOOD  Result Value Ref Range Status   Specimen Description BLOOD BLOOD LEFT ARM  Final   Special Requests   Final    BOTTLES DRAWN AEROBIC AND ANAEROBIC Blood Culture adequate volume   Culture   Final    NO GROWTH < 24 HOURS Performed at [facility], [address] [place], [place] [address]    Report Status PENDING  Incomplete  MRSA Next Gen by PCR, Nasal     Status: None   Collection Time: 01/29/23  1:47 PM   Specimen: Nasal Mucosa; Nasal Swab  Result Value Ref Range Status   MRSA by PCR Next Gen NOT DETECTED NOT DETECTED Final    Comment: (NOTE) The GeneXpert MRSA Assay (FDA approved for NASAL specimens only), is one component of a comprehensive MRSA colonization surveillance program. It is not intended to  diagnose MRSA infection nor to guide or monitor treatment for MRSA infections. Test performance is not FDA approved in patients less than 11 years old. Performed at [facility], [address] [place], [place] [address]   Culture, blood (Routine X 2) w Reflex to ID Panel     Status: None (Preliminary result)   Collection Time: 01/29/23  2:10 PM   Specimen: BLOOD  Result Value Ref Range Status   Specimen Description BLOOD A-LINE  Final   Special Requests   Final    BOTTLES DRAWN AEROBIC AND ANAEROBIC Blood Culture adequate volume   Culture   Final    NO GROWTH < 24 HOURS Performed at [facility], [address], [place], [place] [address]    Report Status PENDING  Incomplete    Coagulation Studies: Recent Labs    01/29/23 1108  LABPROT 16.5*  INR 1.3*     Urinalysis: Recent Labs    01/29/23 2227  COLORURINE YELLOW*  LABSPEC 1.014  PHURINE 5.0  GLUCOSEU NEGATIVE  HGBUR MODERATE*  BILIRUBINUR NEGATIVE  KETONESUR 5*  PROTEINUR NEGATIVE  NITRITE NEGATIVE  LEUKOCYTESUR TRACE*      Imaging: DG Chest Port 1 View  Result Date: 01/29/2023 CLINICAL DATA:  Intubation EXAM: PORTABLE CHEST 1 VIEW COMPARISON:  01/29/2023 8:30 a.m. FINDINGS: Interval placement of endotracheal tube, tip over the midtrachea, and esophagogastric tube, tip not visualized although below the diaphragm. Cardiomegaly. Small bilateral pleural effusions. Osseous structures unremarkable. IMPRESSION: 1. Interval placement of endotracheal tube, tip over the midtrachea, and esophagogastric tube, tip not visualized although below the diaphragm. 2. Cardiomegaly. 3. Small bilateral pleural effusions. Electronically Signed   By: Delanna Ahmadi M.D.   On: 01/29/2023 17:03   DG Abd 1 View  Result Date: 01/29/2023 CLINICAL DATA:  Orogastric tube placement. EXAM: ABDOMEN - 1 VIEW COMPARISON:  None Available. FINDINGS: Distal tip of nasogastric tube is seen in expected position of gastroesophageal  junction. No abnormal bowel dilatation is noted. IMPRESSION: Distal tip of nasogastric tube is seen in expected position of gastroesophageal junction. Advancement is recommended. Electronically Signed   By: Marijo Conception M.D.   On: 01/29/2023 17:02   DG Chest Portable 1 View  Result Date: 01/29/2023 CLINICAL DATA:  Shortness of breath. EXAM: PORTABLE CHEST 1 VIEW COMPARISON:  01/21/2022 FINDINGS: Leftward patient rotation. The cardio pericardial silhouette is enlarged. Vascular congestion noted with bibasilar atelectasis/infiltrate and probable layering small bilateral effusions. The visualized bony structures of the thorax are unremarkable. Telemetry leads overlie the chest. IMPRESSION: Vascular congestion with  bibasilar atelectasis/infiltrate and probable layering small bilateral effusions. Electronically Signed   By: Misty Stanley M.D.   On: 01/29/2023 08:38     Medications:    ampicillin-sulbactam (UNASYN) IV     fentaNYL infusion INTRAVENOUS 200 mcg/hr (01/30/23 1100)   heparin 10,000 units/ 20 mL infusion syringe 500 Units/hr (01/30/23 1100)   norepinephrine (LEVOPHED) Adult infusion 16 mcg/min (01/30/23 1100)   prismasol BGK 2/2.5 dialysis solution 2,000 mL/hr at 01/30/23 1030   prismasol BGK 2/2.5 replacement solution Stopped (01/30/23 0510)   prismasol BGK 2/2.5 replacement solution Stopped (01/30/23 0511)   vasopressin 0.04 Units/min (01/30/23 1100)    Chlorhexidine Gluconate Cloth  6 each Topical Daily   docusate  100 mg Per Tube BID   [START ON 01/31/2023] feeding supplement (PROSource TF20)  60 mL Per Tube BID   feeding supplement (VITAL AF 1.2 CAL)  1,000 mL Per Tube Q24H   free water  30 mL Per Tube Q4H   multivitamin  1 tablet Per Tube QHS   [START ON 01/31/2023] nutrition supplement (JUVEN)  1 packet Per Tube BID BM   mouth rinse  15 mL Mouth Rinse Q2H   pantoprazole (PROTONIX) IV  40 mg Intravenous Daily   polyethylene glycol  17 g Per Tube Daily   docusate sodium,  fentaNYL, heparin, mouth rinse, polyethylene glycol, sodium chloride  Assessment/ Plan:  John [name] is a 77 y.o. black male with chronic diastolic congestive heart failure, hypertension, COPD, hyperlipidemia, who is admitted to [facility] on 01/29/2023 for Peripheral edema [R60.9] Shock circulatory ([place]) [R57.9] Acute on chronic congestive heart failure, unspecified heart failure type (HCC) [I50.9] Acute renal failure superimposed on chronic kidney disease, unspecified acute renal failure type, unspecified CKD stage ([place]) [N17.9, N18.9]  Acute Kidney Injury on chronic kidney disease stage IV: baseline creatinine of 2.67, GFR of 24 on 01/06/23. History of bland urine. Acute kidney injury most likely secondary to cardiorenal syndrome. Chronic kidney disease secondary to hypertension.  Continue CRRT.  Monitor volume status   Acute exacerbation of chronic diastolic congestive heart failure and acute respiratory failure requiring intubation and mechanical ventilation:    Hypotension with cardiogenic shock requiring vasopressors: norepinephrine and vasopressin gtt. Appreciate cardiology and pulmonary input.    LOS: 1 Ritaj Dullea 2/15/202411:56 AM

## 2023-01-30 NOTE — Progress Notes (Signed)
A-line stopped functioning. Unable to flush, pull blood, or obtain waveform. 2 other RN's to bedside to assess. Dr. Lanney Gins to bedside to assess. Patient is extremely agitated and clamping down. Dr. Lanney Gins placed new catheter and a new biopatch and dressing were applied.  Precedex ordered for agitation.

## 2023-01-30 NOTE — Progress Notes (Signed)
CRITICAL CARE     Name: John [name] MRN: [id number] DOB: Oct 07, 1946     LOS: 1   SUBJECTIVE FINDINGS & SIGNIFICANT EVENTS    History of presenting illness:    This is 77 year old male with a history of essential hypertension, on hydralazine and Cozaar, stage IV CKD but has not been on dialysis in the past, has chronic diastolic and systolic heart failure with cor pulmonale, moderate tricuspid regurgitation and aortic valve regurgitation, history of a ascending aortic aneurysm, coronary artery disease with atherosclerosis, COPD and chronic hypoxemia baseline 2 L/min supplemental oxygen at home with recurrent episodes of hypoxemia and remote acute exacerbation of COPD with hypoxemia and hypercapnia 2 months ago history of ruptured left quadricep tendon 2 years ago, chronic thrombocytopenia and erythrocytosis recurrent lower extremity cellulitis, recurrent hyperkalemia, morbid obesity history of anasarca dyslipidemia, recent admission for acute CHF exacerbation requiring Lasix drip and BiPAP support, who was brought in by EMS due to worsening altered mental status and circulatory shock.  I was able to meet with wife at bedside during my evaluation who shares patient has been sleeping majority of each day over the last week has been on arousable and received phone call from medical provider regarding worsening kidney function which prompted ER evaluation.  He had chest x-ray performed while in the ER showing bilateral pleural effusions with vascular congestion and atelectasis/infiltrate.  Patient in circulatory shock with Levophed support on admission in ER.  PCCM consultation for admission to medical intensive care unit with circulatory shock consistent with acute on chronic renal failure with CHF exacerbation.  01/30/23- I  met with girlfriend of patient today to review short term medical plan.  He had Echo whith RV dysfunction and concern for PE.  We have started CRRT yesterday with additional interval improvement. He will have CTPE today.  He is weaned down on pressors today on vasor 0.4 and 4 of levophed reduced from 36 this morning.  Lines/tubes :   Microbiology/Sepsis markers: Results for orders placed or performed during the hospital encounter of 01/29/23  Culture, blood (Routine X 2) w Reflex to ID Panel     Status: None (Preliminary result)   Collection Time: 01/29/23  1:10 PM   Specimen: BLOOD  Result Value Ref Range Status   Specimen Description BLOOD BLOOD LEFT ARM  Final   Special Requests   Final    BOTTLES DRAWN AEROBIC AND ANAEROBIC Blood Culture adequate volume   Culture   Final    NO GROWTH < 24 HOURS Performed at [facility], [address] [place], [place] [address]    Report Status PENDING  Incomplete  MRSA Next Gen by PCR, Nasal     Status: None   Collection Time: 01/29/23  1:47 PM   Specimen: Nasal Mucosa; Nasal Swab  Result Value Ref Range Status   MRSA by PCR Next Gen NOT DETECTED NOT DETECTED Final    Comment: (NOTE) The GeneXpert MRSA Assay (FDA approved for NASAL specimens only), is one component of a comprehensive MRSA colonization surveillance program. It is not intended to diagnose MRSA infection nor to guide or monitor treatment for MRSA infections. Test performance is not FDA approved in patients less than 60 years old. Performed at [facility], [address] [place], [place] [address]   Culture, blood (Routine X 2) w Reflex to ID Panel     Status: None (Preliminary result)   Collection Time: 01/29/23  2:10 PM   Specimen: BLOOD  Result  Value Ref Range Status   Specimen Description BLOOD A-LINE  Final   Special Requests   Final    BOTTLES DRAWN AEROBIC AND ANAEROBIC Blood Culture adequate volume   Culture   Final    NO GROWTH < 24  HOURS Performed at [facility], [address] [place], [place] [address]    Report Status PENDING  Incomplete    Anti-infectives:  Anti-infectives (From admission, onward)    Start     Dose/Rate Route Frequency Ordered Stop   01/30/23 1700  vancomycin (VANCOREADY) IVPB 1250 mg/250 mL        1,250 mg 166.7 mL/hr over 90 Minutes Intravenous Every 24 hours 01/29/23 1543     01/30/23 0000  vancomycin variable dose per unstable renal function (pharmacist dosing)  Status:  Discontinued         Does not apply See admin instructions 01/29/23 1125 01/29/23 1548   01/29/23 2200  piperacillin-tazobactam (ZOSYN) IVPB 3.375 g  Status:  Discontinued        3.375 g 12.5 mL/hr over 240 Minutes Intravenous Every 6 hours 01/29/23 1547 01/29/23 1548   01/29/23 2200  piperacillin-tazobactam (ZOSYN) IVPB 3.375 g        3.375 g 100 mL/hr over 30 Minutes Intravenous Every 6 hours 01/29/23 1548     01/29/23 2100  piperacillin-tazobactam (ZOSYN) IVPB 2.25 g  Status:  Discontinued        2.25 g 100 mL/hr over 30 Minutes Intravenous Every 8 hours 01/29/23 1125 01/29/23 1547   01/29/23 1115  piperacillin-tazobactam (ZOSYN) IVPB 3.375 g        3.375 g 100 mL/hr over 30 Minutes Intravenous  Once 01/29/23 1103 01/29/23 1457   01/29/23 1115  vancomycin (VANCOREADY) IVPB 2000 mg/400 mL        2,000 mg 200 mL/hr over 120 Minutes Intravenous  Once 01/29/23 1104 01/29/23 1656        PAST MEDICAL HISTORY   Past Medical History:  Diagnosis Date   CHF (congestive heart failure) (HCC)    EF 45% in 2021   Chronic kidney disease    COPD (chronic obstructive pulmonary disease) ([place]) 02/16/2020   Hyperlipidemia    Hypertension    Pneumonia 2021     SURGICAL HISTORY   Past Surgical History:  Procedure Laterality Date   CATARACT EXTRACTION W/PHACO Left 10/31/2021   Procedure: CATARACT EXTRACTION PHACO AND INTRAOCULAR LENS PLACEMENT ([place] LEFT VISION BLUE 3.45 00:48.0;  Surgeon: Leandrew Koyanagi, MD;  Location: [place];  Service: Ophthalmology;  Laterality: Left;   CATARACT EXTRACTION W/PHACO Right 11/14/2021   Procedure: CATARACT EXTRACTION PHACO AND INTRAOCULAR LENS PLACEMENT ([facility]) RIGHT 8.59 01:10.5;  Surgeon: Leandrew Koyanagi, MD;  Location: [place];  Service: Ophthalmology;  Laterality: Right;   INGUINAL HERNIA REPAIR Right 02/04/2017   Procedure: HERNIA REPAIR INGUINAL ADULT;  Surgeon: Leonie Green, MD;  Location: [facility];  Service: General;  Laterality: Right;   NO PAST SURGERIES     QUADRICEPS TENDON REPAIR Left 02/15/2020   Procedure: REPAIR QUADRICEP TENDON;  Surgeon: Corky Mull, MD;  Location: [facility];  Service: Orthopedics;  Laterality: Left;     FAMILY HISTORY   Family History  Problem Relation Age of Onset   Diabetes Mother    Diabetes Brother    Diabetes Maternal Grandmother    Diabetes Paternal Grandmother      SOCIAL HISTORY   Social History   Tobacco Use   Smoking status: Former  Packs/day: 0.25    Years: 53.00    Total pack years: 13.25    Types: Cigarettes    Quit date: 02/15/2015    Years since quitting: 7.9   Smokeless tobacco: Never  Substance Use Topics   Alcohol use: Yes    Comment: occassional   Drug use: No     MEDICATIONS   Current Medication:  Current Facility-Administered Medications:    Chlorhexidine Gluconate Cloth 2 % PADS 6 each, 6 each, Topical, Daily, Rust-Chester, Britton L, NP, 6 each at 01/29/23 2059   docusate (COLACE) 50 MG/5ML liquid 100 mg, 100 mg, Per Tube, BID, Rust-Chester, Toribio Harbour L, NP   docusate sodium (COLACE) capsule 100 mg, 100 mg, Oral, BID PRN, Ottie Glazier, MD   fentaNYL (SUBLIMAZE) bolus via infusion 50 mcg, 50 mcg, Intravenous, Q1H PRN, Rust-Chester, Britton L, NP, 25 mcg at 01/30/23 0746   fentaNYL 2574mg in NS 2569m(109mml) infusion-PREMIX, 0-200 mcg/hr, Intravenous, Continuous, Rust-Chester, Britton L, NP, Last Rate: 17.5 mL/hr at 01/30/23 0800,  175 mcg/hr at 01/30/23 0800   heparin 10,000 units/ 20 mL infusion syringe, 500-1,000 Units/hr, CRRT, Continuous, Rust-Chester, Britton L, NP, Last Rate: 1 mL/hr at 01/30/23 0800, 500 Units/hr at 01/30/23 0800   heparin injection 1,000-6,000 Units, 1,000-6,000 Units, CRRT, PRN, Kolluru, Sarath, MD, 4,000 Units at 01/30/23 0429   norepinephrine (LEVOPHED) 16 mg in 250m44m.064 mg/mL) premix infusion, 0-40 mcg/min, Intravenous, Continuous, Rust-Chester, Britton L, NP, Last Rate: 33.8 mL/hr at 01/30/23 0800, 36 mcg/min at 01/30/23 0800   Oral care mouth rinse, 15 mL, Mouth Rinse, Q2H, Gradie Butrick, MD, 15 mL at 01/30/23 0753   Oral care mouth rinse, 15 mL, Mouth Rinse, PRN, AlesLanney Ginsad, MD   pantoprazole (PROTONIX) injection 40 mg, 40 mg, Intravenous, Daily, Rust-Chester, Britton L, NP, 40 mg at 01/29/23 2118   piperacillin-tazobactam (ZOSYN) IVPB 3.375 g, 3.375 g, Intravenous, Q6H, ChilWynelle ClevelandH, Stopped at 01/30/23 0542   polyethylene glycol (MIRALAX / GLYCOLAX) packet 17 g, 17 g, Oral, Daily PRN, AlesLanney Ginsad, MD   polyethylene glycol (MIRALAX / GLYCOLAX) packet 17 g, 17 g, Per Tube, Daily, Rust-Chester, BritHuel Cote   prismasol BGK 2/2.5 dialysis solution, , CRRT, Continuous, Kolluru, Sarath, MD, Last Rate: 2,000 mL/hr at 01/30/23 0341, New Bag at 01/30/23 0341   prismasol BGK 2/2.5 replacement solution, , CRRT, Continuous, Kolluru, Sarath, MD, Paused at 01/30/23 0510   prismasol BGK 2/2.5 replacement solution, , CRRT, Continuous, Kolluru, Sarath, MD, Paused at 01/30/23 0511   sodium chloride 0.9 % primer fluid for CRRT, , CRRT, PRN, Kolluru, Sarath, MD   vancomycin (VANCOREADY) IVPB 1250 mg/250 mL, 1,250 mg, Intravenous, Q24H, ChilMickeal SkinnerRPH   vasopressin (PITRESSIN) 20 Units in sodium chloride 0.9 % 100 mL infusion-*FOR SHOCK*, 0-0.04 Units/min, Intravenous, Continuous, Rust-Chester, Britton L, NP, Last Rate: 12 mL/hr at 01/30/23 0800, 0.04 Units/min at 01/30/23  0800    ALLERGIES   Patient has no known allergies.    REVIEW OF SYSTEMS    Unable to obtain ROS due to unresponsive mental status.  PHYSICAL EXAMINATION   Vital Signs: Temp:  [96.7 F (35.9 C)-98.4 F (36.9 C)] 97 F (36.1 C) (02/15 0750) Pulse Rate:  [36-75] 36 (02/15 0750) Resp:  [12-30] 24 (02/15 0750) BP: (64-134)/(38-101) 119/89 (02/15 0750) SpO2:  [86 %-100 %] 97 % (02/15 0803) Arterial Line BP: (81-146)/(55-83) 134/66 (02/15 0750) FiO2 (%):  [35 %-50 %] 35 % (02/15 0803) Weight:  [101.3 kg] 101.3 kg (02/15 0157)  GENERAL:  Age-appropriate, unable to arouse patient with sternal rub no apparent distress HEAD: Normocephalic, atraumatic.  EYES: Pupils equal, round, reactive to light.  No scleral icterus.  MOUTH: Moist mucosal membrane. NECK: Supple. No thyromegaly. No nodules. No JVD.  PULMONARY: Rhonchorous breathing with crepitations bilaterally CARDIOVASCULAR: S1 and S2. Regular rate and rhythm. No murmurs, rubs, or gallops.  GASTROINTESTINAL: Soft, nontender, non-distended. No masses. Positive bowel sounds. No hepatosplenomegaly.  MUSCULOSKELETAL: Chronic stasis dermatitis with 2+ edema bilaterally NEUROLOGIC: GCS 6, poorly arousable SKIN:intact,warm,dry   PERTINENT DATA     Infusions:  fentaNYL infusion INTRAVENOUS 175 mcg/hr (01/30/23 0800)   heparin 10,000 units/ 20 mL infusion syringe 500 Units/hr (01/30/23 0800)   norepinephrine (LEVOPHED) Adult infusion 36 mcg/min (01/30/23 0800)   piperacillin-tazobactam Stopped (01/30/23 0542)   prismasol BGK 2/2.5 dialysis solution 2,000 mL/hr at 01/30/23 0341   prismasol BGK 2/2.5 replacement solution Stopped (01/30/23 0510)   prismasol BGK 2/2.5 replacement solution Stopped (01/30/23 0511)   vancomycin     vasopressin 0.04 Units/min (01/30/23 0800)   Scheduled Medications:  Chlorhexidine Gluconate Cloth  6 each Topical Daily   docusate  100 mg Per Tube BID   mouth rinse  15 mL Mouth Rinse Q2H    pantoprazole (PROTONIX) IV  40 mg Intravenous Daily   polyethylene glycol  17 g Per Tube Daily   PRN Medications: docusate sodium, fentaNYL, heparin, mouth rinse, polyethylene glycol, sodium chloride Hemodynamic parameters:   Intake/Output: 02/14 0701 - 02/15 0700 In: 1930.1 [I.V.:1428.8; IV Piggyback:501.3] Out: 718 [Urine:485]  Ventilator  Settings: Vent Mode: PRVC FiO2 (%):  [35 %-50 %] 35 % Set Rate:  [15 bmp-16 bmp] 16 bmp Vt Set:  [450 mL-500 mL] 500 mL PEEP:  [5 cmH20] 5 cmH20 Plateau Pressure:  [15 cmH20-18 cmH20] 16 cmH20  LAB RESULTS:  Basic Metabolic Panel: Recent Labs  Lab 01/28/23 1007 01/29/23 0702 01/29/23 2046 01/29/23 2358 01/30/23 0455  NA 140 138 134*  --  137  K 4.8 5.0 5.5* 4.6 4.7  CL 93* 91* 92*  --  96*  CO2 31 31 27  $ --  28  GLUCOSE 141* 126* 149*  --  123*  BUN 133* 136* 129*  --  107*  CREATININE 5.41* 6.11* 6.14*  --  4.93*  CALCIUM 9.9 9.9 9.1  --  9.2  MG  --   --  2.5*  --   --   PHOS  --   --  8.5*  --  6.1*    Liver Function Tests: Recent Labs  Lab 01/29/23 0702 01/29/23 2046 01/30/23 0455  AST 36  --   --   ALT 13  --   --   ALKPHOS 75  --   --   BILITOT 2.1*  --   --   PROT 7.1  --   --   ALBUMIN 3.7 3.3* 3.3*    No results for input(s): "LIPASE", "AMYLASE" in the last 168 hours. No results for input(s): "AMMONIA" in the last 168 hours. CBC: Recent Labs  Lab 01/29/23 0702 01/29/23 2227 01/30/23 0455  WBC 8.6 11.4* 11.4*  NEUTROABS 6.8  --   --   HGB 18.3* 16.8 17.5*  HCT 59.8* 54.3* 56.0*  MCV 106.8* 105.2* 104.7*  PLT 80* 91* 78*    Cardiac Enzymes: No results for input(s): "CKTOTAL", "CKMB", "CKMBINDEX", "TROPONINI" in the last 168 hours. BNP: Invalid input(s): "POCBNP" CBG: Recent Labs  Lab 01/29/23 1345 01/29/23 1939 01/29/23 2331 01/30/23 0309 01/30/23 0738  GLUCAP 138*  143* 115* 88 128*       IMAGING RESULTS:  Imaging: DG Chest Port 1 View  Result Date: 01/29/2023 CLINICAL DATA:   Intubation EXAM: PORTABLE CHEST 1 VIEW COMPARISON:  01/29/2023 8:30 a.m. FINDINGS: Interval placement of endotracheal tube, tip over the midtrachea, and esophagogastric tube, tip not visualized although below the diaphragm. Cardiomegaly. Small bilateral pleural effusions. Osseous structures unremarkable. IMPRESSION: 1. Interval placement of endotracheal tube, tip over the midtrachea, and esophagogastric tube, tip not visualized although below the diaphragm. 2. Cardiomegaly. 3. Small bilateral pleural effusions. Electronically Signed   By: Delanna Ahmadi M.D.   On: 01/29/2023 17:03   DG Abd 1 View  Result Date: 01/29/2023 CLINICAL DATA:  Orogastric tube placement. EXAM: ABDOMEN - 1 VIEW COMPARISON:  None Available. FINDINGS: Distal tip of nasogastric tube is seen in expected position of gastroesophageal junction. No abnormal bowel dilatation is noted. IMPRESSION: Distal tip of nasogastric tube is seen in expected position of gastroesophageal junction. Advancement is recommended. Electronically Signed   By: Marijo Conception M.D.   On: 01/29/2023 17:02   DG Chest Portable 1 View  Result Date: 01/29/2023 CLINICAL DATA:  Shortness of breath. EXAM: PORTABLE CHEST 1 VIEW COMPARISON:  01/21/2022 FINDINGS: Leftward patient rotation. The cardio pericardial silhouette is enlarged. Vascular congestion noted with bibasilar atelectasis/infiltrate and probable layering small bilateral effusions. The visualized bony structures of the thorax are unremarkable. Telemetry leads overlie the chest. IMPRESSION: Vascular congestion with bibasilar atelectasis/infiltrate and probable layering small bilateral effusions. Electronically Signed   By: Misty Stanley M.D.   On: 01/29/2023 08:38   @PROBHOSP$ @ DG Chest Port 1 View  Result Date: 01/29/2023 CLINICAL DATA:  Intubation EXAM: PORTABLE CHEST 1 VIEW COMPARISON:  01/29/2023 8:30 a.m. FINDINGS: Interval placement of endotracheal tube, tip over the midtrachea, and esophagogastric  tube, tip not visualized although below the diaphragm. Cardiomegaly. Small bilateral pleural effusions. Osseous structures unremarkable. IMPRESSION: 1. Interval placement of endotracheal tube, tip over the midtrachea, and esophagogastric tube, tip not visualized although below the diaphragm. 2. Cardiomegaly. 3. Small bilateral pleural effusions. Electronically Signed   By: Delanna Ahmadi M.D.   On: 01/29/2023 17:03   DG Abd 1 View  Result Date: 01/29/2023 CLINICAL DATA:  Orogastric tube placement. EXAM: ABDOMEN - 1 VIEW COMPARISON:  None Available. FINDINGS: Distal tip of nasogastric tube is seen in expected position of gastroesophageal junction. No abnormal bowel dilatation is noted. IMPRESSION: Distal tip of nasogastric tube is seen in expected position of gastroesophageal junction. Advancement is recommended. Electronically Signed   By: Marijo Conception M.D.   On: 01/29/2023 17:02   DG Chest Portable 1 View  Result Date: 01/29/2023 CLINICAL DATA:  Shortness of breath. EXAM: PORTABLE CHEST 1 VIEW COMPARISON:  01/21/2022 FINDINGS: Leftward patient rotation. The cardio pericardial silhouette is enlarged. Vascular congestion noted with bibasilar atelectasis/infiltrate and probable layering small bilateral effusions. The visualized bony structures of the thorax are unremarkable. Telemetry leads overlie the chest. IMPRESSION: Vascular congestion with bibasilar atelectasis/infiltrate and probable layering small bilateral effusions. Electronically Signed   By: Misty Stanley M.D.   On: 01/29/2023 08:38     ASSESSMENT AND PLAN    -Multidisciplinary rounds held today   Circulatory shock -present on admission -Suspect this is cardiogenic due to history of CHF and cor pulmonale with recent discharge for acute CHF exacerbation.  Currently requiring Levophed support, judicious use of IV fluids due to CHF status -Will perform infectious workup for possible septic distributive shock including blood and respiratory  cultures as well as urinalysis -follow ABG and LA -follow up cultures -Patient may require central access due to Levophed infusion -No signs of acute blood loss anemia -emperic ABX -consider stress dose steroids   Acute Hypoxic Respiratory Failure -Patient with loud rhonchorous breathing and congestion, CXR with pleural effusions and pulmonary edema.  Blood work with cardiac biomarkers elevated including BNP over 2499, and troponin 2351 suggestive of NSTEMI versus demand ischemia -Patient high risk for respiratory arrest, poorly arousable and unable to protect own airway may require endotracheal intubation with mechanical ventilation -continue Bronchodilator Therapy -Unable to diurese patient due to circulatory shock -legionella UR ag Strep pneumo UR ag   Acute decompensated systolic and diastolic CHF with EF [id number] Moderate TR and AR -Diuresis as able, currently on levophed due to shock -oxygen as needed ICU telemetry monitoring   Renal Failure-ESRD - nephrology on case - patient needs renal replacement -appreciate input -follow chem 7 -follow UO -continue Foley Catheter-assess need daily    Severe Coagulopathy  -  patient uremic with trhombocytopenia and on ASA with supratheraputic INR.  - will need to improve coagulopathy with FFP/platelet transfusion    Toxic metabolic encephalopathy   Due to uremia and ESRD   - should improve with CRRT   - possible septic encephalopathy will obtain blood cultures    ID -continue IV abx as prescibed -follow up cultures  GI/Nutrition GI PROPHYLAXIS as indicated DIET-->TF's as tolerated Constipation protocol as indicated  ENDO - ICU hypoglycemic\Hyperglycemia protocol -check FSBS per protocol   ELECTROLYTES -follow labs as needed -replace as needed -pharmacy consultation   DVT/GI PRX ordered -SCDs  TRANSFUSIONS AS NEEDED MONITOR FSBS ASSESS the need for LABS as needed   Critical care provider statement:   Total  critical care time: 33 minutes   Performed by: Lanney Gins MD   Critical care time was exclusive of separately billable procedures and treating other patients.   Critical care was necessary to treat or prevent imminent or life-threatening deterioration.   Critical care was time spent personally by me on the following activities: development of treatment plan with patient and/or surrogate as well as nursing, discussions with consultants, evaluation of patient's response to treatment, examination of patient, obtaining history from patient or surrogate, ordering and performing treatments and interventions, ordering and review of laboratory studies, ordering and review of radiographic studies, pulse oximetry and re-evaluation of patient's condition.    Ottie Glazier, M.D.  Pulmonary & Critical Care Medicine       This document was prepared using Dragon voice recognition software and may include unintentional dictation errors.    Ottie Glazier, M.D.  Division of [place]

## 2023-01-30 NOTE — TOC Initial Note (Signed)
Transition of Care [facility]) - Initial/Assessment Note    Patient Details  Name: John [name] MRN: [id number] Date of Birth: Mar 27, 1946  Transition of Care [facility]) CM/SW Contact:    John Hutching, RN Phone Number: 01/30/2023, 11:12 AM  Clinical Narrative:                 Patient admitted to the hospital with sepsis and shock.  Patient is currently in the ICU intubated and sedated on CRRT.  Patient has a history of heart failure and stage 4 kidney disease.  RNCM was able to speak with patient's significant other, John [name].  They have been together for over 25 years.  John [name] does not have any information on family for patient.  John [name] does not want to be responsible for making decisions.  Hopefully patient will be able to extubate within the next day or so and can make his own wishes known, if not DSS will need to be involved for guardianship/protective order to make decisions.    Per John [name] patient is usually independent, drives, on chronic O2.  Current with PCP, John [name], and HF Clinic John [name].  Patient's OP nephrologist is John [name].   Expected Discharge Plan:  (TBD) Barriers to Discharge: Continued Medical Work up   Patient Goals and CMS Choice Patient states their goals for this hospitalization and ongoing recovery are:: Unable to state, intubated          Expected Discharge Plan and Services   Discharge Planning Services: CM Consult   Living arrangements for the past 2 months: Single Family Home                 DME Arranged: N/A                    Prior Living Arrangements/Services Living arrangements for the past 2 months: Single Family Home Lives with:: Significant Other Patient language and need for interpreter reviewed:: Yes        Need for Family Participation in Patient Care: Yes (Comment) Care giver support system in place?: Yes (comment) Current home services: DME (cane, walker, oxygen) Criminal Activity/Legal Involvement Pertinent to Current  Situation/Hospitalization: No - Comment as needed  Activities of Daily Living Home Assistive Devices/Equipment: Oxygen ADL Screening (condition at time of admission) Patient's cognitive ability adequate to safely complete daily activities?: No Is the patient deaf or have difficulty hearing?: No Does the patient have difficulty seeing, even when wearing glasses/contacts?: Yes Does the patient have difficulty concentrating, remembering, or making decisions?: Yes Patient able to express need for assistance with ADLs?: No Does the patient have difficulty dressing or bathing?: Yes Independently performs ADLs?: No Communication: Dependent Is this a change from baseline?: Change from baseline, expected to last >3 days Dressing (OT): Dependent Is this a change from baseline?: Change from baseline, expected to last >3 days Grooming: Dependent Is this a change from baseline?: Change from baseline, expected to last >3 days Feeding: Dependent Is this a change from baseline?: Change from baseline, expected to last >3 days Bathing: Dependent Is this a change from baseline?: Change from baseline, expected to last >3 days Toileting: Dependent Is this a change from baseline?: Change from baseline, expected to last >3days In/Out Bed: Dependent Is this a change from baseline?: Change from baseline, expected to last >3 days Walks in Home: Dependent Is this a change from baseline?: Change from baseline, expected to last >3 days Does the patient have difficulty walking or climbing stairs?: Yes Weakness  of Legs: Both Weakness of Arms/Hands: Both  Permission Sought/Granted Permission sought to share information with : Case Manager, Family Supports    Share Information with NAME: John [name]     Permission granted to share info w Relationship: signficant other  Permission granted to share info w Contact Information: [phone]  Emotional Assessment Appearance:: Appears stated  age Attitude/Demeanor/Rapport: Intubated (Following Commands or Not Following Commands) Affect (typically observed): Unable to Assess   Alcohol / Substance Use: Not Applicable Psych Involvement: No (comment)  Admission diagnosis:  Peripheral edema [R60.9] Shock circulatory (HCC) [R57.9] Acute on chronic congestive heart failure, unspecified heart failure type ([place]) [I50.9] Acute renal failure superimposed on chronic kidney disease, unspecified acute renal failure type, unspecified CKD stage ([place]) [N17.9, N18.9] Patient Active Problem List   Diagnosis Date Noted   Shock circulatory ([place]) 01/29/2023   Impaired fasting glucose 11/24/2022   Hypokalemia 11/24/2022   Acute on chronic respiratory failure with hypoxia and hypercapnia (HCC) 11/21/2022   Obesity (BMI 30-39.9) 11/21/2022   Anasarca 11/21/2022   Acute kidney injury superimposed on chronic kidney disease ([place]) 11/20/2022   Cellulitis 10/07/2022   Acute on chronic diastolic CHF (congestive heart failure) ([place]) 10/02/2022   Myocardial injury 10/02/2022   Hyperkalemia 10/02/2022   Acute respiratory failure with hypoxia ([place]) 01/28/2022   Cardiorenal syndrome, stage 1-4 or unspecified chronic kidney disease, with heart failure ([place]) 01/28/2022   Acute left systolic heart failure ([place]) 01/28/2022   Cor pulmonale, acute ([place]) 01/28/2022   Renal failure syndrome 01/28/2022   CKD (chronic kidney disease), stage IV ([place]) 01/28/2022   COPD exacerbation ([place]) 01/28/2022   Cardiogenic shock ([place]) 01/28/2022   Erythrocytosis 12/20/2021   Thrombocytopenia ([place]) 12/20/2021   Acute CHF (congestive heart failure) ([place]) 06/21/2020   Hypertension    COPD (chronic obstructive pulmonary disease) ([place]) 02/16/2020   Rupture of left quadriceps tendon 02/15/2020   Community acquired pneumonia 09/01/2019   Pneumonia 09/01/2019   PCP:  John Graff, FNP Pharmacy:   [facility] [address] ([address]), [place] [address] [place]) [place] [address] Phone: [phone] Fax: [phone]     Social Determinants of Health (SDOH) Social History: SDOH Screenings   Food Insecurity: No Food Insecurity (01/29/2023)  Housing: Low Risk  (01/29/2023)  Transportation Needs: No Transportation Needs (01/29/2023)  Utilities: Not At Risk (01/29/2023)  Alcohol Screen: Low Risk  (07/17/2022)  Depression (PHQ2-9): Low Risk  (07/30/2022)  Financial Resource Strain: Low Risk  (07/17/2022)  Physical Activity: Sufficiently Active (07/17/2022)  Social Connections: Socially Integrated (07/17/2022)  Stress: No Stress Concern Present (07/17/2022)  Tobacco Use: Medium Risk (01/29/2023)   SDOH Interventions:     Readmission Risk Interventions    01/30/2023   11:09 AM  Readmission Risk Prevention Plan  Transportation Screening Complete  Medication Review ([place]) Complete  PCP or Specialist appointment within 3-5 days of discharge Complete  HRI or [place] Complete  SW Recovery Care/Counseling Consult Complete  Palliative Care Screening Complete  [place] Not Applicable

## 2023-01-30 NOTE — Progress Notes (Signed)
                                                     Palliative Care Progress Note, Assessment & Plan   Patient Name: John [name]       Date: 01/30/2023 DOB: 24-Feb-1946  Age: 77 y.o. MRN#: [id number] Attending Physician: Ottie Glazier, MD Primary Care Physician: Alisa Graff, FNP Admit Date: 01/29/2023  Subjective: Patient is lying in bed sedated and on MV.  CRRT is going.  RN at bedside as well as nephrology NP.  HPI: 77 y.o. male  with past medical history of essential hypertension, on hydralazine and Cozaar, stage IV CKD but has not been on dialysis in the past, has chronic diastolic and systolic heart failure with cor pulmonale, moderate tricuspid regurgitation and aortic valve regurgitation, history of an ascending aortic aneurysm, CAD with atherosclerosis, COPD and chronic hypoxemia baseline 2 L/min supplemental oxygen at home with recurrent episodes of hypoxemia and remote AECOPD with hypoxemia and hypercapnia 2 months ago, history of ruptured left quadricep tendon 2 years ago, chronic thrombocytopenia and erythrocytosis, recurrent lower extremity cellulitis, recurrent hyperkalemia, morbid obesity history of anasarca dyslipidemia, recent admission for acute CHF exacerbation requiring lasix drip and BiPAP support, who was admitted on 01/29/2023 with AMS and circulatory shock.   2/14 - admitted to ICU, intubated, CRRT started  Summary of counseling/coordination of care: After reviewing the patient's chart and assessing the patient at bedside, I counseled with dayshift RN and nephrology NP.  Prior to intubation, patient was able to speak with nephrology regarding his wishes for HD.  However, further goals of care discussions were not able to be performed prior to patient becoming too lethargic/obtunded and subsequently intubated.  TOC is following  closely to help determine decision maker.  As per TOC, DSS may need to be involved if patient is unable to be extubated and able to participate in goals of care/medical decision making discussions independently.  PMT will remain available to patient throughout his hospitalization.  We will await decision maker as determined by TOC in order to continue goals of care discussions.  Physical Exam Vitals reviewed.  Constitutional:      Appearance: He is ill-appearing.  HENT:     Head: Normocephalic.     Mouth/Throat:     Comments: Missing teeth Cardiovascular:     Rate and Rhythm: Normal rate.  Pulmonary:     Comments: MV Musculoskeletal:     Right lower leg: 4+ Edema present.     Left lower leg: 4+ Edema present.             Total Time 25 minutes   Elnora Quizon L. Ilsa Iha, FNP-BC Palliative Medicine Team Team Phone # [phone]

## 2023-01-31 DIAGNOSIS — R579 Shock, unspecified: Secondary | ICD-10-CM | POA: Diagnosis not present

## 2023-01-31 LAB — GLUCOSE, CAPILLARY
Glucose-Capillary: 117 mg/dL — ABNORMAL HIGH (ref 70–99)
Glucose-Capillary: 118 mg/dL — ABNORMAL HIGH (ref 70–99)
Glucose-Capillary: 134 mg/dL — ABNORMAL HIGH (ref 70–99)
Glucose-Capillary: 135 mg/dL — ABNORMAL HIGH (ref 70–99)
Glucose-Capillary: 141 mg/dL — ABNORMAL HIGH (ref 70–99)
Glucose-Capillary: 75 mg/dL (ref 70–99)

## 2023-01-31 LAB — CBC
HCT: 51.3 % (ref 39.0–52.0)
Hemoglobin: 16.5 g/dL (ref 13.0–17.0)
MCH: 33.2 pg (ref 26.0–34.0)
MCHC: 32.2 g/dL (ref 30.0–36.0)
MCV: 103.2 fL — ABNORMAL HIGH (ref 80.0–100.0)
Platelets: 44 10*3/uL — ABNORMAL LOW (ref 150–400)
RBC: 4.97 MIL/uL (ref 4.22–5.81)
RDW: 16.2 % — ABNORMAL HIGH (ref 11.5–15.5)
WBC: 9.4 10*3/uL (ref 4.0–10.5)
nRBC: 0 % (ref 0.0–0.2)

## 2023-01-31 LAB — RENAL FUNCTION PANEL
Albumin: 3 g/dL — ABNORMAL LOW (ref 3.5–5.0)
Albumin: 2.5 g/dL — ABNORMAL LOW (ref 3.5–5.0)
Anion gap: 11 (ref 5–15)
Anion gap: 10 (ref 5–15)
BUN: 62 mg/dL — ABNORMAL HIGH (ref 8–23)
BUN: 58 mg/dL — ABNORMAL HIGH (ref 8–23)
CO2: 24 mmol/L (ref 22–32)
CO2: 26 mmol/L (ref 22–32)
Calcium: 9.4 mg/dL (ref 8.9–10.3)
Calcium: 8.9 mg/dL (ref 8.9–10.3)
Chloride: 101 mmol/L (ref 98–111)
Chloride: 99 mmol/L (ref 98–111)
Creatinine, Ser: 2.96 mg/dL — ABNORMAL HIGH (ref 0.61–1.24)
Creatinine, Ser: 3.06 mg/dL — ABNORMAL HIGH (ref 0.61–1.24)
GFR, Estimated: 21 mL/min — ABNORMAL LOW (ref >60)
GFR, Estimated: 20 mL/min — ABNORMAL LOW (ref >60)
Glucose, Bld: 133 mg/dL — ABNORMAL HIGH (ref 70–99)
Glucose, Bld: 124 mg/dL — ABNORMAL HIGH (ref 70–99)
Phosphorus: 4.5 mg/dL (ref 2.5–4.6)
Phosphorus: 4.1 mg/dL (ref 2.5–4.6)
Potassium: 4.2 mmol/L (ref 3.5–5.1)
Potassium: 4.1 mmol/L (ref 3.5–5.1)
Sodium: 136 mmol/L (ref 135–145)
Sodium: 135 mmol/L (ref 135–145)

## 2023-01-31 LAB — LEGIONELLA PNEUMOPHILA SEROGP 1 UR AG: L. pneumophila Serogp 1 Ur Ag: NEGATIVE

## 2023-01-31 LAB — HEPARIN LEVEL (UNFRACTIONATED): Heparin Unfractionated: <0.1 IU/mL — ABNORMAL LOW (ref 0.30–0.70)

## 2023-01-31 LAB — APTT: aPTT: 34 seconds (ref 24–36)

## 2023-01-31 LAB — MAGNESIUM: Magnesium: 1.8 mg/dL (ref 1.7–2.4)

## 2023-01-31 MED ORDER — FOLIC ACID 1 MG PO TABS
1.0000 mg | ORAL_TABLET | Freq: Every day | ORAL | Status: DC
  Administered 2023-01-31 – 2023-02-05 (×6): 1 mg
  Filled 2023-01-31 (×6): qty 1

## 2023-01-31 MED ORDER — THIAMINE HCL 100 MG/ML IJ SOLN
100.0000 mg | Freq: Every day | INTRAMUSCULAR | Status: DC
  Administered 2023-01-31 – 2023-02-07 (×7): 100 mg via INTRAVENOUS
  Filled 2023-01-31 (×7): qty 2

## 2023-01-31 MED ORDER — VITAL AF 1.2 CAL PO LIQD
1000.0000 mL | ORAL | Status: DC
  Administered 2023-01-31 – 2023-02-05 (×5): 1000 mL

## 2023-01-31 MED ORDER — ASPIRIN 81 MG PO CHEW
81.0000 mg | CHEWABLE_TABLET | Freq: Every day | ORAL | Status: DC
  Administered 2023-01-31 – 2023-02-05 (×6): 81 mg
  Filled 2023-01-31 (×6): qty 1

## 2023-01-31 MED ORDER — LORAZEPAM 2 MG/ML IJ SOLN
INTRAMUSCULAR | Status: AC
  Administered 2023-01-31: 0.5 mg via INTRAVENOUS
  Filled 2023-01-31: qty 1

## 2023-01-31 MED ORDER — LORAZEPAM 1 MG PO TABS
1.0000 mg | ORAL_TABLET | Freq: Four times a day (QID) | ORAL | Status: DC
  Administered 2023-01-31 – 2023-02-02 (×11): 1 mg
  Filled 2023-01-31 (×11): qty 1

## 2023-01-31 MED ORDER — SODIUM CHLORIDE 0.9 % IV SOLN
2.0000 g | Freq: Two times a day (BID) | INTRAVENOUS | Status: DC
  Administered 2023-01-31 – 2023-02-03 (×6): 2 g via INTRAVENOUS
  Filled 2023-01-31 (×2): qty 2
  Filled 2023-01-31: qty 12.5
  Filled 2023-01-31 (×3): qty 2

## 2023-01-31 MED ORDER — LORAZEPAM 2 MG/ML IJ SOLN: 0.5000 mg | Freq: Once | INTRAMUSCULAR | Status: AC | PRN

## 2023-01-31 MED ORDER — GABAPENTIN 100 MG PO CAPS: 100.0000 mg | ORAL_CAPSULE | Freq: Every day | ORAL | Status: DC

## 2023-01-31 MED ORDER — GABAPENTIN 250 MG/5ML PO SOLN
100.0000 mg | Freq: Every day | ORAL | Status: DC
  Administered 2023-01-31 – 2023-02-02 (×3): 100 mg
  Filled 2023-01-31 (×3): qty 2

## 2023-01-31 MED ORDER — LORAZEPAM 2 MG/ML IJ SOLN
1.0000 mg | INTRAMUSCULAR | Status: DC | PRN
  Administered 2023-01-31: 1 mg via INTRAVENOUS
  Administered 2023-01-31 (×7): 2 mg via INTRAVENOUS
  Filled 2023-01-31 (×8): qty 1

## 2023-01-31 NOTE — Progress Notes (Signed)
Pharmacy Antibiotic Note  John [name] is a 77 y.o. male admitted on 01/29/2023 with sepsis, respiatory failure with pleural effusions, acute decompensated heart failure and renal failure on CRRT. New fevers on 2/16 PM. Pharmacy has been consulted for vancomycin and cefepime dosing.   On CRRT with Effluent rate 25 mL/kg/hr.  Plan: Cefepime 2 grams every 12 hours  Follow CRRT / renal function for dose adjustments and culture finalization.  Height: 5' 11"$  (180.3 cm) Weight: 97.4 kg (214 lb 11.7 oz) IBW/kg (Calculated) : 75.3  Temp (24hrs), Avg:97.5 F (36.4 C), Min:95.9 F (35.5 C), Max:100.2 F (37.9 C)  Recent Labs  Lab 01/29/23 0702 01/29/23 2046 01/29/23 2227 01/30/23 0455 01/30/23 1612 01/31/23 0358 01/31/23 1114  WBC 8.6  --  11.4* 11.4*  --  9.4  --   CREATININE 6.11* 6.14*  --  4.93* 3.70* 2.96* 3.06*    Estimated Creatinine Clearance: 24 mL/min (A) (by C-G formula based on SCr of 3.06 mg/dL (H)).    No Known Allergies  Antimicrobials this admission: vancomycin 2/14 >> 2/15 Zosyn 2/14 >> 2/15 Unasyn 2/15 >> 2/16 Vancomycin 2/16 >> Cefepime 2/16 >>  Dose adjustments this admission: N/a  Microbiology results: 2/14 BCx: NG x 2 days 2/14 MRSA PCR: Not detected 2/16 Tracheal aspirate: no organisms  Thank you for allowing pharmacy to be a part of this patient's care.  Wynelle Cleveland 01/31/2023 3:48 PM

## 2023-01-31 NOTE — Progress Notes (Signed)
PHARMACY CONSULT NOTE   Pharmacy Consult for Electrolyte Monitoring and Replacement   Recent Labs: Potassium (mmol/L)  Date Value  01/31/2023 4.2   Magnesium (mg/dL)  Date Value  01/31/2023 1.8   Calcium (mg/dL)  Date Value  01/31/2023 9.4   Albumin (g/dL)  Date Value  01/31/2023 3.0 (L)   Phosphorus (mg/dL)  Date Value  01/31/2023 4.5   Sodium (mmol/L)  Date Value  01/31/2023 136    Assessment: 77 year old male PMH HTN, CKD4, diastolic and systolic heart failure with cor pulmonale, COPD on 2L PTA, history ascending aortic aneurysm, CAD. CRRT with effluent rate 25 mL/kg/hr.  Pressor support with Levophed and vasopressin.  Nutrition: tube feeds  Goal of Therapy:  K > 4 Mag > 2 All other electrolytes within normal limits  Plan:  No replacement warranted Electrolytes @0500$  and @1600$  while on CRRT  Wynelle Cleveland ,PharmD Clinical Pharmacist 01/31/2023 8:50 AM

## 2023-01-31 NOTE — Progress Notes (Signed)
[name]  ROUNDING NOTE   Subjective:   [name] was admitted to [facility] on 01/29/2023 for Peripheral edema [R60.9] Shock circulatory ([place]) [R57.9] Acute on chronic congestive heart failure, unspecified heart failure type ([place]) [I50.9] Acute renal failure superimposed on chronic kidney disease, unspecified acute renal failure type, unspecified CKD stage ([place]) [N17.9, N18.9]  On CRRT  Norepinephrine and vasopressin  Patient with bradycardia so his Precedex was discontinued.   UOP 798m.   Patient had CTA of chest negative for pulmonary embolism  Objective:  Vital signs in last 24 hours:  Temp:  [95.7 F (35.4 C)-99.1 F (37.3 C)] 99.1 F (37.3 C) (02/16 0949) Pulse Rate:  [43-120] 65 (02/16 0949) Resp:  [15-24] 16 (02/16 0949) BP: (81-148)/(47-126) 118/78 (02/16 0930) SpO2:  [92 %-100 %] 99 % (02/16 1133) Arterial Line BP: (86-155)/(51-86) 111/64 (02/16 0949) FiO2 (%):  [35 %-40 %] 40 % (02/16 1133) Weight:  [97.4 kg] 97.4 kg (02/16 0350)  Weight change: -3.9 kg Filed Weights   01/29/23 0649 01/30/23 0157 01/31/23 0350  Weight: 98.9 kg 101.3 kg 97.4 kg    Intake/Output: I/O last 3 completed shifts: In: 2804.4 [I.V.:1958.9; Other:30; NG/GT:450.3; IV Piggyback:365.2] Out: 3228 [Urine:1299; Emesis/NG output:120]   Intake/Output this shift:  No intake/output data recorded.  Physical Exam: General: Critically ill  Head: ETT  Eyes: Anicteric, PERRL  Neck: +JVD  Lungs:  Bilateral crackles, PRVC 40%  Heart: bradycardia  Abdomen:  Soft  Extremities:  + peripheral edema.    Neurologic: Intubated and sedated  Skin: No lesions  Access: Left femoral Temp HD catheter placed 2/14.     Basic Metabolic Panel: Recent Labs  Lab 01/29/23 2046 01/29/23 2358 01/30/23 0455 01/30/23 1612 01/31/23 0358 01/31/23 1114  NA 134*  --  137 134* 136 135  K 5.5* 4.6 4.7 4.2 4.2 4.1  CL 92*  --  96* 99 101 99  CO2 27  --  28 25 24 26  $ GLUCOSE 149*  --   123* 132* 133* 124*  BUN 129*  --  107* 78* 62* 58*  CREATININE 6.14*  --  4.93* 3.70* 2.96* 3.06*  CALCIUM 9.1  --  9.2 9.1 9.4 8.9  MG 2.5*  --  2.3  --  1.8  --   PHOS 8.5*  --  6.1* 5.3* 4.5 4.1     Liver Function Tests: Recent Labs  Lab 01/29/23 0702 01/29/23 2046 01/30/23 0455 01/30/23 1612 01/31/23 0358 01/31/23 1114  AST 36  --   --  23  --   --   ALT 13  --   --  12  --   --   ALKPHOS 75  --   --  50  --   --   BILITOT 2.1*  --   --  4.3*  --   --   PROT 7.1  --   --  5.8*  --   --   ALBUMIN 3.7 3.3* 3.3* 3.0*  3.0* 3.0* 2.5*    No results for input(s): "LIPASE", "AMYLASE" in the last 168 hours. No results for input(s): "AMMONIA" in the last 168 hours.  CBC: Recent Labs  Lab 01/29/23 0702 01/29/23 2227 01/30/23 0455 01/31/23 0358  WBC 8.6 11.4* 11.4* 9.4  NEUTROABS 6.8  --   --   --   HGB 18.3* 16.8 17.5* 16.5  HCT 59.8* 54.3* 56.0* 51.3  MCV 106.8* 105.2* 104.7* 103.2*  PLT 80* 91* 78* 44*  Cardiac Enzymes: No results for input(s): "CKTOTAL", "CKMB", "CKMBINDEX", "TROPONINI" in the last 168 hours.  BNP: Invalid input(s): "POCBNP"  CBG: Recent Labs  Lab 01/30/23 1609 01/30/23 1923 01/30/23 2315 01/31/23 0348 01/31/23 0731  GLUCAP 101* 143* 127* 134* 118*     Microbiology: Results for orders placed or performed during the hospital encounter of 01/29/23  Culture, Respiratory w Gram Stain     Status: None (Preliminary result)   Collection Time: 01/29/23  3:45 AM   Specimen: Tracheal Aspirate; Respiratory  Result Value Ref Range Status   Specimen Description   Final    TRACHEAL ASPIRATE Performed at [facility] [address] [place], [place] [address]    Special Requests   Final    NONE Performed at [facility], [address] [place], [place] [address]    Gram Stain   Final    MODERATE WBC PRESENT,BOTH PMN AND MONONUCLEAR NO ORGANISMS SEEN    Culture   Final    CULTURE REINCUBATED FOR BETTER  GROWTH Performed at [facility], [place] [address], [place], [place] [address]    Report Status PENDING  Incomplete  Culture, blood (Routine X 2) w Reflex to ID Panel     Status: None (Preliminary result)   Collection Time: 01/29/23  1:10 PM   Specimen: BLOOD  Result Value Ref Range Status   Specimen Description BLOOD BLOOD LEFT ARM  Final   Special Requests   Final    BOTTLES DRAWN AEROBIC AND ANAEROBIC Blood Culture adequate volume   Culture   Final    NO GROWTH 2 DAYS Performed at [facility], [address], [place], [place] [address]    Report Status PENDING  Incomplete  MRSA Next Gen by PCR, Nasal     Status: None   Collection Time: 01/29/23  1:47 PM   Specimen: Nasal Mucosa; Nasal Swab  Result Value Ref Range Status   MRSA by PCR Next Gen NOT DETECTED NOT DETECTED Final    Comment: (NOTE) The GeneXpert MRSA Assay (FDA approved for NASAL specimens only), is one component of a comprehensive MRSA colonization surveillance program. It is not intended to diagnose MRSA infection nor to guide or monitor treatment for MRSA infections. Test performance is not FDA approved in patients less than 40 years old. Performed at [facility], [address] [place], [place] [address]   Culture, blood (Routine X 2) w Reflex to ID Panel     Status: None (Preliminary result)   Collection Time: 01/29/23  2:10 PM   Specimen: BLOOD  Result Value Ref Range Status   Specimen Description BLOOD A-LINE  Final   Special Requests   Final    BOTTLES DRAWN AEROBIC AND ANAEROBIC Blood Culture adequate volume   Culture   Final    NO GROWTH 2 DAYS Performed at [facility], [address] [place], [place] [address]    Report Status PENDING  Incomplete    Coagulation Studies: Recent Labs    01/29/23 1108  LABPROT 16.5*  INR 1.3*     Urinalysis: Recent Labs    01/29/23 2227  COLORURINE YELLOW*  LABSPEC 1.014  PHURINE 5.0  GLUCOSEU NEGATIVE  HGBUR  MODERATE*  BILIRUBINUR NEGATIVE  KETONESUR 5*  PROTEINUR NEGATIVE  NITRITE NEGATIVE  LEUKOCYTESUR TRACE*       Imaging: CT Angio Chest Pulmonary Embolism (PE) W or WO Contrast  Result Date: 01/30/2023 CLINICAL DATA:  Chronic hypoxemia and hypercapnia related to  COPD, congestive heart failure and chronic renal failure. EXAM: CT ANGIOGRAPHY CHEST WITH CONTRAST TECHNIQUE: Multidetector CT imaging of the chest was performed using the standard protocol during bolus administration of intravenous contrast. Multiplanar CT image reconstructions and MIPs were obtained to evaluate the vascular anatomy. RADIATION DOSE REDUCTION: This exam was performed according to the departmental dose-optimization program which includes automated exposure control, adjustment of the mA and/or kV according to patient size and/or use of iterative reconstruction technique. CONTRAST:  52m OMNIPAQUE IOHEXOL 350 MG/ML SOLN COMPARISON:  Portable chest obtained yesterday. Chest, abdomen and pelvis CT dated 01/18/2022. FINDINGS: Cardiovascular: Enlarged heart. Enlarged ascending thoracic aorta measuring 5.2 cm in diameter at the level of the main pulmonary artery. Enlarged central pulmonary arteries with a main pulmonary artery measuring 3.6 cm in diameter. Maximum aortic arch diameter of 3.2 cm. Descending thoracic aortic diameter of 3.1 cm. Normally opacified pulmonary arteries with no pulmonary arterial filling defects seen. Atheromatous calcifications, including the coronary arteries and aorta. Mediastinum/Nodes: Endotracheal tube in satisfactory position. Enteric tube extending through the esophagus with its tip in the proximal to mid stomach. No enlarged lymph nodes. Unremarkable thyroid gland. Lungs/Pleura: Small to moderate-sized right pleural effusion and small left pleural effusion. Diffuse bilateral centrilobular and some paraseptal bullous changes, most pronounced in the upper lobes. Mild bilateral lower lobe dependent  atelectasis. No airspace consolidation suspicious for pneumonia. No lung nodules. Upper Abdomen: The liver has mildly nodular contours with somewhat prominent lateral segment left lobe and caudate lobe and somewhat small right lobe. Two small probable cysts in the right lobe of the liver. Normal sized spleen. Small to moderate amount of ascites in the upper abdomen. Musculoskeletal: Minimal thoracic spine degenerative changes. Review of the MIP images confirms the above findings. IMPRESSION: 1. No pulmonary emboli. 2. Ascending thoracic aortic aneurysm measuring 5.2 cm in maximum diameter. Recommend semi-annual imaging followup by CTA or MRA and referral to cardiothoracic surgery if not already obtained. This recommendation follows 2010 ACCF/AHA/AATS/ACR/ASA/SCA/SCAI/SIR/STS/SVM Guidelines for the Diagnosis and Management of Patients With Thoracic Aortic Disease. Circulation. 2010; 121:[id number] Aortic aneurysm NOS (ICD10-I71.9) 3. Enlarged central pulmonary arteries compatible with pulmonary arterial hypertension. 4. Small to moderate-sized right pleural effusion and small left pleural effusion. 5. Changes of COPD and centrilobular and paraseptal emphysema. 6. Findings suggesting cirrhosis of the liver with an associated small to moderate amount of ascites in the upper abdomen. 7.  Calcific coronary artery and aortic atherosclerosis. Aortic Atherosclerosis (ICD10-I70.0) and Emphysema (ICD10-J43.9). Electronically Signed   By: SClaudie ReveringM.D.   On: 01/30/2023 18:48   ECHOCARDIOGRAM COMPLETE  Result Date: 01/30/2023    ECHOCARDIOGRAM REPORT   Patient Name:   John [name] Date of Exam: 01/30/2023 Medical Rec #:  [id number]     Height:       71.0 in Accession #:    [id number]    Weight:       223.3 lb Date of Birth:  [birth year]    BSA:          2.210 m Patient Age:    89years       BP:           163/69 mmHg Patient Gender: M              HR:           58 bpm. Exam Location:  [facility] Procedure: 2D Echo, Cardiac  Doppler and Color Doppler Indications:     Elevated Troponin  History:  Patient has prior history of Echocardiogram examinations, most                  recent 10/02/2022. CHF, COPD; Risk Factors:Hypertension.                  Cardiogenic shock, ESRD.  Sonographer:     Wenda Low Referring Phys:  [id number] BRITTON L [place] Diagnosing Phys: Ida Rogue MD  Sonographer Comments: Suboptimal apical window and echo performed with patient supine and on artificial respirator. IMPRESSIONS  1. Left ventricular ejection fraction, by estimation, is 50 to 55%. The left ventricle has low normal function. The left ventricle has no regional wall motion abnormalities. There is mild left ventricular hypertrophy. Left ventricular diastolic parameters are consistent with Grade I diastolic dysfunction (impaired relaxation). There is the interventricular septum is flattened in systole and diastole, consistent with right ventricular pressure and volume overload.  2. Right ventricular systolic function is severely reduced. The right ventricular size is moderately enlarged. There is severely elevated pulmonary artery systolic pressure. The estimated right ventricular systolic pressure is [id number] mmHg.  3. Right atrial size was moderately dilated.  4. The mitral valve is normal in structure. Mild mitral valve regurgitation. No evidence of mitral stenosis. There is mild late systolic prolapse of both leaflets of the mitral valve.  5. Tricuspid valve regurgitation is mild to moderate.  6. The aortic valve is tricuspid. Aortic valve regurgitation is mild to moderate. No aortic stenosis is present.  7. The inferior vena cava is dilated in size with <50% respiratory variability, suggesting right atrial pressure of 15 mmHg.  8. There is mild dilatation of the aortic root, measuring 44 mm. There is mild dilatation of the ascending aorta, measuring 43 mm. FINDINGS  Left Ventricle: Left ventricular ejection fraction, by estimation,  is 50 to 55%. The left ventricle has low normal function. The left ventricle has no regional wall motion abnormalities. The left ventricular internal cavity size was normal in size. There is mild left ventricular hypertrophy. The interventricular septum is flattened in systole and diastole, consistent with right ventricular pressure and volume overload. Left ventricular diastolic parameters are consistent with Grade I diastolic dysfunction (impaired relaxation). Right Ventricle: The right ventricular size is moderately enlarged. No increase in right ventricular wall thickness. Right ventricular systolic function is severely reduced. There is severely elevated pulmonary artery systolic pressure. The tricuspid regurgitant velocity is 3.45 m/s, and with an assumed right atrial pressure of 15 mmHg, the estimated right ventricular systolic pressure is [id number] mmHg. Left Atrium: Left atrial size was normal in size. Right Atrium: Right atrial size was moderately dilated. Pericardium: There is no evidence of pericardial effusion. Mitral Valve: The mitral valve is normal in structure. There is mild late systolic prolapse of both leaflets of the mitral valve. Mild mitral valve regurgitation. No evidence of mitral valve stenosis. MV peak gradient, 3.9 mmHg. The mean mitral valve gradient is 1.0 mmHg. Tricuspid Valve: The tricuspid valve is normal in structure. Tricuspid valve regurgitation is mild to moderate. No evidence of tricuspid stenosis. Aortic Valve: The aortic valve is tricuspid. Aortic valve regurgitation is mild to moderate. No aortic stenosis is present. Aortic valve mean gradient measures 1.0 mmHg. Aortic valve peak gradient measures 3.9 mmHg. Aortic valve area, by VTI measures 3.79 cm. Pulmonic Valve: The pulmonic valve was normal in structure. Pulmonic valve regurgitation is mild to moderate. No evidence of pulmonic stenosis. Aorta: The aortic root is normal in size and structure. There is mild dilatation  of the  aortic root, measuring 44 mm. There is mild dilatation of the ascending aorta, measuring 43 mm. Venous: The inferior vena cava is dilated in size with less than 50% respiratory variability, suggesting right atrial pressure of 15 mmHg. IAS/Shunts: No atrial level shunt detected by color flow Doppler.  LEFT VENTRICLE PLAX 2D LVIDd:         4.40 cm   Diastology LVIDs:         3.00 cm   LV e' medial:    6.85 cm/s LV PW:         1.30 cm   LV E/e' medial:  6.3 LV IVS:        1.30 cm   LV e' lateral:   5.98 cm/s LVOT diam:     2.10 cm   LV E/e' lateral: 7.2 LV SV:         68 LV SV Index:   31 LVOT Area:     3.46 cm  RIGHT VENTRICLE RV Basal diam:  5.10 cm RV Mid diam:    5.90 cm RV S prime:     14.50 cm/s TAPSE (M-mode): 1.8 cm LEFT ATRIUM             Index        RIGHT ATRIUM           Index LA diam:        2.90 cm 1.31 cm/m   RA Area:     27.50 cm LA Vol (A2C):   59.3 ml 26.84 ml/m  RA Volume:   101.00 ml 45.71 ml/m LA Vol (A4C):   59.1 ml 26.74 ml/m LA Biplane Vol: 59.4 ml 26.88 ml/m  AORTIC VALVE                    PULMONIC VALVE AV Area (Vmax):    3.22 cm     PV Vmax:       1.00 m/s AV Area (Vmean):   4.15 cm     PV Peak grad:  4.0 mmHg AV Area (VTI):     3.79 cm AV Vmax:           98.70 cm/s AV Vmean:          45.500 cm/s AV VTI:            0.180 m AV Peak Grad:      3.9 mmHg AV Mean Grad:      1.0 mmHg LVOT Vmax:         91.80 cm/s LVOT Vmean:        54.500 cm/s LVOT VTI:          0.197 m LVOT/AV VTI ratio: 1.09  AORTA Ao Root diam: 4.40 cm Ao Asc diam:  4.30 cm MITRAL VALVE               TRICUSPID VALVE MV Area (PHT): 2.10 cm    TR Peak grad:   47.6 mmHg MV Area VTI:   2.38 cm    TR Vmax:        345.00 cm/s MV Peak grad:  3.9 mmHg MV Mean grad:  1.0 mmHg    SHUNTS MV Vmax:       0.99 m/s    Systemic VTI:  0.20 m MV Vmean:      42.3 cm/s   Systemic Diam: 2.10 cm MV Decel Time: 361 msec MV E velocity: 43.00 cm/s MV A velocity: 84.90 cm/s MV E/A ratio:  0.51 Ida Rogue MD Electronically signed by  Ida Rogue MD Signature Date/Time: 01/30/2023/4:10:59 PM    Final    DG Abd Portable 1V  Result Date: 01/30/2023 CLINICAL DATA:  NGT placement EXAM: PORTABLE ABDOMEN - 1 VIEW COMPARISON:  01/29/2023 FINDINGS: Image of the upper abdomen demonstrates a NG tube superimposed with the stomach below the diaphragm. IMPRESSION: NG tube appropriately positioned. Electronically Signed   By: Sammie Bench M.D.   On: 01/30/2023 13:12   DG Chest Port 1 View  Result Date: 01/29/2023 CLINICAL DATA:  Intubation EXAM: PORTABLE CHEST 1 VIEW COMPARISON:  01/29/2023 8:30 a.m. FINDINGS: Interval placement of endotracheal tube, tip over the midtrachea, and esophagogastric tube, tip not visualized although below the diaphragm. Cardiomegaly. Small bilateral pleural effusions. Osseous structures unremarkable. IMPRESSION: 1. Interval placement of endotracheal tube, tip over the midtrachea, and esophagogastric tube, tip not visualized although below the diaphragm. 2. Cardiomegaly. 3. Small bilateral pleural effusions. Electronically Signed   By: Delanna Ahmadi M.D.   On: 01/29/2023 17:03   DG Abd 1 View  Result Date: 01/29/2023 CLINICAL DATA:  Orogastric tube placement. EXAM: ABDOMEN - 1 VIEW COMPARISON:  None Available. FINDINGS: Distal tip of nasogastric tube is seen in expected position of gastroesophageal junction. No abnormal bowel dilatation is noted. IMPRESSION: Distal tip of nasogastric tube is seen in expected position of gastroesophageal junction. Advancement is recommended. Electronically Signed   By: Marijo Conception M.D.   On: 01/29/2023 17:02     Medications:    sodium chloride 5 mL/hr at 01/31/23 0700   fentaNYL infusion INTRAVENOUS 200 mcg/hr (01/31/23 1133)   heparin 10,000 units/ 20 mL infusion syringe 500 Units/hr (01/31/23 1130)   norepinephrine (LEVOPHED) Adult infusion 8 mcg/min (01/31/23 0700)   prismasol BGK 2/2.5 dialysis solution 2,000 mL/hr at 01/31/23 0711   prismasol BGK 2/2.5 replacement  solution 400 mL/hr at 01/30/23 2103   prismasol BGK 2/2.5 replacement solution 400 mL/hr at 01/30/23 2103   vasopressin 0.04 Units/min (01/31/23 0916)    aspirin  81 mg Per Tube Daily   Chlorhexidine Gluconate Cloth  6 each Topical Daily   docusate  100 mg Per Tube BID   feeding supplement (PROSource TF20)  60 mL Per Tube BID   feeding supplement (VITAL AF 1.2 CAL)  1,000 mL Per Tube [id number]   folic acid  1 mg Per Tube Daily   free water  30 mL Per Tube Q4H   LORazepam  1 mg Per Tube Q6H   multivitamin  1 tablet Per Tube QHS   nutrition supplement (JUVEN)  1 packet Per Tube BID BM   mouth rinse  15 mL Mouth Rinse Q2H   pantoprazole (PROTONIX) IV  40 mg Intravenous Daily   polyethylene glycol  17 g Per Tube Daily   thiamine (VITAMIN B1) injection  100 mg Intravenous Daily   docusate sodium, fentaNYL, heparin, LORazepam, mouth rinse, polyethylene glycol, sodium chloride  Assessment/ Plan:  [name] is a 77 y.o. black male with chronic diastolic congestive heart failure, hypertension, COPD, hyperlipidemia, who is admitted to [facility] on 01/29/2023 for Peripheral edema [R60.9] Shock circulatory ([place]) [R57.9] Acute on chronic congestive heart failure, unspecified heart failure type ([place]) [I50.9] Acute renal failure superimposed on chronic kidney disease, unspecified acute renal failure type, unspecified CKD stage ([place]) [N17.9, N18.9]  Acute Kidney Injury on chronic kidney disease stage IV: baseline creatinine of 2.67, GFR of 24 on 01/06/23. History of bland urine. Acute kidney injury most likely secondary to  cardiorenal syndrome. Chronic kidney disease secondary to hypertension.  Continue CRRT. Will add heparin.  Monitor volume status   Acute exacerbation of chronic diastolic congestive heart failure and acute respiratory failure requiring intubation and mechanical ventilation.  Hypotension with cardiogenic shock requiring vasopressors: norepinephrine and vasopressin gtt. Appreciate  cardiology and pulmonary input.    LOS: 2 Obdulio Mash 2/16/202412:03 PM

## 2023-01-31 NOTE — Progress Notes (Signed)
Patient with significant agitation during shift, requiring bilateral wrist restrains, mittens and staff to be at bedside to try to calm patient. Precedex had to be dc's due to bradycardia and fentanyl boluses dropped his blood pressure significantly. Pt in obvious discomfort and distress during this time. Domingo Pulse to bedside and orders for ativan given. After ativan the patient was calm and rested, vital signs improved and CRRT pressures improved.

## 2023-01-31 NOTE — Progress Notes (Signed)
CRITICAL CARE     Name: John [name] MRN: [id number] DOB: 09/04/1946     LOS: 2   SUBJECTIVE FINDINGS & SIGNIFICANT EVENTS    History of presenting illness:    This is 77 year old male with a history of essential hypertension, on hydralazine and Cozaar, stage IV CKD but has not been on dialysis in the past, has chronic diastolic and systolic heart failure with cor pulmonale, moderate tricuspid regurgitation and aortic valve regurgitation, history of a ascending aortic aneurysm, coronary artery disease with atherosclerosis, COPD and chronic hypoxemia baseline 2 L/min supplemental oxygen at home with recurrent episodes of hypoxemia and remote acute exacerbation of COPD with hypoxemia and hypercapnia 2 months ago history of ruptured left quadricep tendon 2 years ago, chronic thrombocytopenia and erythrocytosis recurrent lower extremity cellulitis, recurrent hyperkalemia, morbid obesity history of anasarca dyslipidemia, recent admission for acute CHF exacerbation requiring Lasix drip and BiPAP support, who was brought in by EMS due to worsening altered mental status and circulatory shock.  I was able to meet with wife at bedside during my evaluation who shares patient has been sleeping majority of each day over the last week has been on arousable and received phone call from medical provider regarding worsening kidney function which prompted ER evaluation.  He had chest x-ray performed while in the ER showing bilateral pleural effusions with vascular congestion and atelectasis/infiltrate.  Patient in circulatory shock with Levophed support on admission in ER.  PCCM consultation for admission to medical intensive care unit with circulatory shock consistent with acute on chronic renal failure with CHF exacerbation.  01/30/23- I  met with girlfriend of patient today to review short term medical plan.  He had Echo whith RV dysfunction and concern for PE.  We have started CRRT yesterday with additional interval improvement. He will have CTPE today.  He is weaned down on pressors today on vasor 0.4 and 4 of levophed reduced from 36 this morning.  01/31/23- remains agitated intermittently overnight. Continued issues with clot burden through CRRT circuit.  Lines/tubes :   Microbiology/Sepsis markers: Results for orders placed or performed during the hospital encounter of 01/29/23  Culture, Respiratory w Gram Stain     Status: None (Preliminary result)   Collection Time: 01/29/23  3:45 AM   Specimen: Tracheal Aspirate; Respiratory  Result Value Ref Range Status   Specimen Description   Final    TRACHEAL ASPIRATE Performed at [facility], [address] [place], [place] [address]    Special Requests   Final    NONE Performed at [facility], [address] [place], [place] [address]    Gram Stain   Final    MODERATE WBC PRESENT,BOTH PMN AND MONONUCLEAR NO ORGANISMS SEEN    Culture   Final    CULTURE REINCUBATED FOR BETTER GROWTH Performed at [facility], [place] [address], [place], [place] [address]    Report Status PENDING  Incomplete  Culture, blood (Routine X 2) w Reflex to ID Panel     Status: None (Preliminary result)   Collection Time: 01/29/23  1:10 PM   Specimen: BLOOD  Result Value Ref Range Status   Specimen Description BLOOD BLOOD LEFT ARM  Final   Special Requests   Final    BOTTLES DRAWN AEROBIC AND ANAEROBIC Blood Culture adequate volume   Culture   Final    NO GROWTH 2 DAYS Performed at [facility], [address] [place],  [address]    Report  Status PENDING  Incomplete  MRSA Next Gen by PCR, Nasal     Status: None   Collection Time: 01/29/23  1:47 PM   Specimen: Nasal Mucosa; Nasal Swab  Result Value Ref Range Status   MRSA by PCR Next Gen NOT  DETECTED NOT DETECTED Final    Comment: (NOTE) The GeneXpert MRSA Assay (FDA approved for NASAL specimens only), is one component of a comprehensive MRSA colonization surveillance program. It is not intended to diagnose MRSA infection nor to guide or monitor treatment for MRSA infections. Test performance is not FDA approved in patients less than 16 years old. Performed at [facility], [address] [place], [place] [address]   Culture, blood (Routine X 2) w Reflex to ID Panel     Status: None (Preliminary result)   Collection Time: 01/29/23  2:10 PM   Specimen: BLOOD  Result Value Ref Range Status   Specimen Description BLOOD A-LINE  Final   Special Requests   Final    BOTTLES DRAWN AEROBIC AND ANAEROBIC Blood Culture adequate volume   Culture   Final    NO GROWTH 2 DAYS Performed at [facility], [address], [place], [place] [address]    Report Status PENDING  Incomplete    Anti-infectives:  Anti-infectives (From admission, onward)    Start     Dose/Rate Route Frequency Ordered Stop   01/30/23 1700  vancomycin (VANCOREADY) IVPB 1250 mg/250 mL  Status:  Discontinued        1,250 mg 166.7 mL/hr over 90 Minutes Intravenous Every 24 hours 01/29/23 1543 01/30/23 1036   01/30/23 1300  Ampicillin-Sulbactam (UNASYN) 3 g in sodium chloride 0.9 % 100 mL IVPB  Status:  Discontinued        3 g 200 mL/hr over 30 Minutes Intravenous Every 8 hours 01/30/23 1036 01/31/23 1118   01/30/23 0000  vancomycin variable dose per unstable renal function (pharmacist dosing)  Status:  Discontinued         Does not apply See admin instructions 01/29/23 1125 01/29/23 1548   01/29/23 2200  piperacillin-tazobactam (ZOSYN) IVPB 3.375 g  Status:  Discontinued        3.375 g 12.5 mL/hr over 240 Minutes Intravenous Every 6 hours 01/29/23 1547 01/29/23 1548   01/29/23 2200  piperacillin-tazobactam (ZOSYN) IVPB 3.375 g  Status:  Discontinued        3.375 g 100 mL/hr over 30 Minutes  Intravenous Every 6 hours 01/29/23 1548 01/30/23 1036   01/29/23 2100  piperacillin-tazobactam (ZOSYN) IVPB 2.25 g  Status:  Discontinued        2.25 g 100 mL/hr over 30 Minutes Intravenous Every 8 hours 01/29/23 1125 01/29/23 1547   01/29/23 1115  piperacillin-tazobactam (ZOSYN) IVPB 3.375 g        3.375 g 100 mL/hr over 30 Minutes Intravenous  Once 01/29/23 1103 01/29/23 1457   01/29/23 1115  vancomycin (VANCOREADY) IVPB 2000 mg/400 mL        2,000 mg 200 mL/hr over 120 Minutes Intravenous  Once 01/29/23 1104 01/29/23 1656        PAST MEDICAL HISTORY   Past Medical History:  Diagnosis Date   CHF (congestive heart failure) (HCC)    EF 45% in 2021   Chronic kidney disease    COPD (chronic obstructive pulmonary disease) ([place]) 02/16/2020   Hyperlipidemia    Hypertension    Pneumonia 2021     SURGICAL HISTORY   Past Surgical History:  Procedure Laterality Date  CATARACT EXTRACTION W/PHACO Left 10/31/2021   Procedure: CATARACT EXTRACTION PHACO AND INTRAOCULAR LENS PLACEMENT ([place]) LEFT VISION BLUE 3.45 00:48.0;  Surgeon: Leandrew Koyanagi, MD;  Location: [place];  Service: Ophthalmology;  Laterality: Left;   CATARACT EXTRACTION W/PHACO Right 11/14/2021   Procedure: CATARACT EXTRACTION PHACO AND INTRAOCULAR LENS PLACEMENT ([facility]) RIGHT 8.59 01:10.5;  Surgeon: Leandrew Koyanagi, MD;  Location: [place];  Service: Ophthalmology;  Laterality: Right;   INGUINAL HERNIA REPAIR Right 02/04/2017   Procedure: HERNIA REPAIR INGUINAL ADULT;  Surgeon: Leonie Green, MD;  Location: [facility];  Service: General;  Laterality: Right;   NO PAST SURGERIES     QUADRICEPS TENDON REPAIR Left 02/15/2020   Procedure: REPAIR QUADRICEP TENDON;  Surgeon: Corky Mull, MD;  Location: [facility];  Service: Orthopedics;  Laterality: Left;     FAMILY HISTORY   Family History  Problem Relation Age of Onset   Diabetes Mother    Diabetes Brother    Diabetes Maternal  Grandmother    Diabetes Paternal Grandmother      SOCIAL HISTORY   Social History   Tobacco Use   Smoking status: Former    Packs/day: 0.25    Years: 53.00    Total pack years: 13.25    Types: Cigarettes    Quit date: 02/15/2015    Years since quitting: 7.9   Smokeless tobacco: Never  Substance Use Topics   Alcohol use: Yes    Comment: occassional   Drug use: No     MEDICATIONS   Current Medication:  Current Facility-Administered Medications:    0.9 %  sodium chloride infusion, 250 mL, Intravenous, Continuous, Aleskerov, Fuad, MD, Last Rate: 5 mL/hr at 01/31/23 1500, Infusion Verify at 01/31/23 1500   aspirin chewable tablet 81 mg, 81 mg, Per Tube, Daily, Wynelle Cleveland, RPH, 81 mg at 01/31/23 1231   Chlorhexidine Gluconate Cloth 2 % PADS 6 each, 6 each, Topical, Daily, Rust-Chester, Britton L, NP, 6 each at 01/31/23 0941   docusate (COLACE) 50 MG/5ML liquid 100 mg, 100 mg, Per Tube, BID, Rust-Chester, Britton L, NP, 100 mg at 01/31/23 0940   docusate sodium (COLACE) capsule 100 mg, 100 mg, Oral, BID PRN, Ottie Glazier, MD   feeding supplement (PROSource TF20) liquid 60 mL, 60 mL, Per Tube, BID, Aleskerov, Fuad, MD, 60 mL at 01/31/23 0940   feeding supplement (VITAL AF 1.2 CAL) liquid 1,000 mL, 1,000 mL, Per Tube, Continuous, Martrell Eguia, Earley Abide, MD, Last Rate: 60 mL/hr at 01/31/23 1500, Infusion Verify at 01/31/23 1500   fentaNYL (SUBLIMAZE) bolus via infusion 50 mcg, 50 mcg, Intravenous, Q1H PRN, Rust-Chester, Britton L, NP, 50 mcg at 01/30/23 1109   fentaNYL 2550mg in NS 2566m(1042mml) infusion-PREMIX, 0-200 mcg/hr, Intravenous, Continuous, Rust-Chester, Britton L, NP, Last Rate: 20 mL/hr at 01/31/23 1500, 200 mcg/hr at 02/0000000099991111folic acid (FOLVITE) tablet 1 mg, 1 mg, Per Tube, Daily, Rust-Chester, Britton L, NP, 1 mg at 01/31/23 0940   free water 30 mL, 30 mL, Per Tube, Q4H, Aleskerov, Fuad, MD, 30 mL at 01/31/23 1308   heparin 10,000 units/ 20 mL infusion syringe,  500-1,000 Units/hr, CRRT, Continuous, Rust-Chester, Britton L, NP, Last Rate: 1 mL/hr at 01/31/23 1500, 500 Units/hr at 01/31/23 1500   heparin injection 1,000-6,000 Units, 1,000-6,000 Units, CRRT, PRN, Kolluru, Sarath, MD, 3,600 Units at 01/31/23 0814   LORazepam (ATIVAN) injection 1-2 mg, 1-2 mg, Intravenous, Q2H PRN, Rust-Chester, Britton L, NP, 2 mg at 01/31/23 1306   LORazepam (  ATIVAN) tablet 1 mg, 1 mg, Per Tube, Q6H, Rust-Chester, Britton L, NP, 1 mg at 01/31/23 1130   multivitamin (RENA-VIT) tablet 1 tablet, 1 tablet, Per Tube, QHS, Ottie Glazier, MD, 1 tablet at 01/30/23 2142   norepinephrine (LEVOPHED) 16 mg in 273m (0.064 mg/mL) premix infusion, 0-40 mcg/min, Intravenous, Continuous, Rust-Chester, Britton L, NP, Last Rate: 5.63 mL/hr at 01/31/23 1500, 6 mcg/min at 01/31/23 1500   nutrition supplement (JUVEN) (JUVEN) powder packet 1 packet, 1 packet, Per Tube, BID BM, AOttie Glazier MD, 1 packet at 01/31/23 1433   Oral care mouth rinse, 15 mL, Mouth Rinse, Q2H, Aleskerov, Fuad, MD, 15 mL at 01/31/23 1433   Oral care mouth rinse, 15 mL, Mouth Rinse, PRN, ALanney Gins Fuad, MD   pantoprazole (PROTONIX) injection 40 mg, 40 mg, Intravenous, Daily, Rust-Chester, Britton L, NP, 40 mg at 01/31/23 0940   polyethylene glycol (MIRALAX / GLYCOLAX) packet 17 g, 17 g, Oral, Daily PRN, ALanney Gins Fuad, MD   polyethylene glycol (MIRALAX / GLYCOLAX) packet 17 g, 17 g, Per Tube, Daily, Rust-Chester, Britton L, NP, 17 g at 01/31/23 0941   prismasol BGK 2/2.5 dialysis solution, , CRRT, Continuous, Kolluru, Sarath, MD, Last Rate: 2,000 mL/hr at 01/31/23 1424, New Bag at 01/31/23 1424   prismasol BGK 2/2.5 replacement solution, , CRRT, Continuous, Kolluru, Sarath, MD, Last Rate: 400 mL/hr at 01/31/23 1200, New Bag at 01/31/23 1200   prismasol BGK 2/2.5 replacement solution, , CRRT, Continuous, Kolluru, Sarath, MD, Last Rate: 400 mL/hr at 01/31/23 1200, New Bag at 01/31/23 1200   sodium chloride 0.9 % primer  fluid for CRRT, , CRRT, PRN, Kolluru, Sarath, MD   thiamine (VITAMIN B1) injection 100 mg, 100 mg, Intravenous, Daily, Rust-Chester, Britton L, NP, 100 mg at 01/31/23 0940   vasopressin (PITRESSIN) 20 Units in sodium chloride 0.9 % 100 mL infusion-*FOR SHOCK*, 0-0.04 Units/min, Intravenous, Continuous, Rust-Chester, Britton L, NP, Last Rate: 12 mL/hr at 01/31/23 1500, 0.04 Units/min at 01/31/23 1500    ALLERGIES   Patient has no known allergies.    REVIEW OF SYSTEMS    Unable to obtain ROS due to unresponsive mental status.  PHYSICAL EXAMINATION   Vital Signs: Temp:  [95.9 F (35.5 C)-100.2 F (37.9 C)] 99.9 F (37.7 C) (02/16 1345) Pulse Rate:  [43-120] 60 (02/16 1345) Resp:  [16-25] 16 (02/16 1345) BP: (81-137)/(49-126) 110/75 (02/16 1330) SpO2:  [93 %-100 %] 97 % (02/16 1345) Arterial Line BP: (86-142)/(52-80) 127/69 (02/16 1345) FiO2 (%):  [35 %-40 %] 40 % (02/16 1133) Weight:  [97.4 kg] 97.4 kg (02/16 0350)  GENERAL: Age-appropriate, unable to arouse patient with sternal rub no apparent distress HEAD: Normocephalic, atraumatic.  EYES: Pupils equal, round, reactive to light.  No scleral icterus.  MOUTH: Moist mucosal membrane. NECK: Supple. No thyromegaly. No nodules. No JVD.  PULMONARY: Rhonchorous breathing with crepitations bilaterally CARDIOVASCULAR: S1 and S2. Regular rate and rhythm. No murmurs, rubs, or gallops.  GASTROINTESTINAL: Soft, nontender, non-distended. No masses. Positive bowel sounds. No hepatosplenomegaly.  MUSCULOSKELETAL: Chronic stasis dermatitis with 2+ edema bilaterally NEUROLOGIC: GCS 6, poorly arousable SKIN:intact,warm,dry   PERTINENT DATA     Infusions:  sodium chloride 5 mL/hr at 01/31/23 1500   feeding supplement (VITAL AF 1.2 CAL) 60 mL/hr at 01/31/23 1500   fentaNYL infusion INTRAVENOUS 200 mcg/hr (01/31/23 1500)   heparin 10,000 units/ 20 mL infusion syringe 500 Units/hr (01/31/23 1500)   norepinephrine (LEVOPHED) Adult  infusion 6 mcg/min (01/31/23 1500)   prismasol BGK 2/2.5 dialysis solution 2,000 mL/hr  at 01/31/23 1424   prismasol BGK 2/2.5 replacement solution 400 mL/hr at 01/31/23 1200   prismasol BGK 2/2.5 replacement solution 400 mL/hr at 01/31/23 1200   vasopressin 0.04 Units/min (01/31/23 1500)   Scheduled Medications:  aspirin  81 mg Per Tube Daily   Chlorhexidine Gluconate Cloth  6 each Topical Daily   docusate  100 mg Per Tube BID   feeding supplement (PROSource TF20)  60 mL Per Tube BID   folic acid  1 mg Per Tube Daily   free water  30 mL Per Tube Q4H   LORazepam  1 mg Per Tube Q6H   multivitamin  1 tablet Per Tube QHS   nutrition supplement (JUVEN)  1 packet Per Tube BID BM   mouth rinse  15 mL Mouth Rinse Q2H   pantoprazole (PROTONIX) IV  40 mg Intravenous Daily   polyethylene glycol  17 g Per Tube Daily   thiamine (VITAMIN B1) injection  100 mg Intravenous Daily   PRN Medications: docusate sodium, fentaNYL, heparin, LORazepam, mouth rinse, polyethylene glycol, sodium chloride Hemodynamic parameters:   Intake/Output: 02/15 0701 - 02/16 0700 In: 2015.8 [I.V.:1254.8; NG/GT:450.3; IV Piggyback:280.7] Out: 2510 [Urine:814; Emesis/NG output:120]  Ventilator  Settings: Vent Mode: PRVC FiO2 (%):  [35 %-40 %] 40 % Set Rate:  [16 bmp] 16 bmp Vt Set:  [500 mL] 500 mL PEEP:  [5 cmH20] 5 cmH20 Plateau Pressure:  [15 [id number] cmH20] 15 cmH20  LAB RESULTS:  Basic Metabolic Panel: Recent Labs  Lab 01/29/23 2046 01/29/23 2358 01/30/23 0455 01/30/23 1612 01/31/23 0358 01/31/23 1114  NA 134*  --  137 134* 136 135  K 5.5*   < > 4.7 4.2 4.2 4.1  CL 92*  --  96* 99 101 99  CO2 27  --  28 25 24 26  $ GLUCOSE 149*  --  123* 132* 133* 124*  BUN 129*  --  107* 78* 62* 58*  CREATININE 6.14*  --  4.93* 3.70* 2.96* 3.06*  CALCIUM 9.1  --  9.2 9.1 9.4 8.9  MG 2.5*  --  2.3  --  1.8  --   PHOS 8.5*  --  6.1* 5.3* 4.5 4.1   < > = values in this interval not displayed.   Liver Function  Tests: Recent Labs  Lab 01/29/23 0702 01/29/23 2046 01/30/23 0455 01/30/23 1612 01/31/23 0358 01/31/23 1114  AST 36  --   --  23  --   --   ALT 13  --   --  12  --   --   ALKPHOS 75  --   --  50  --   --   BILITOT 2.1*  --   --  4.3*  --   --   PROT 7.1  --   --  5.8*  --   --   ALBUMIN 3.7 3.3* 3.3* 3.0*  3.0* 3.0* 2.5*   No results for input(s): "LIPASE", "AMYLASE" in the last 168 hours. No results for input(s): "AMMONIA" in the last 168 hours. CBC: Recent Labs  Lab 01/29/23 0702 01/29/23 2227 01/30/23 0455 01/31/23 0358  WBC 8.6 11.4* 11.4* 9.4  NEUTROABS 6.8  --   --   --   HGB 18.3* 16.8 17.5* 16.5  HCT 59.8* 54.3* 56.0* 51.3  MCV 106.8* 105.2* 104.7* 103.2*  PLT 80* 91* 78* 44*   Cardiac Enzymes: No results for input(s): "CKTOTAL", "CKMB", "CKMBINDEX", "TROPONINI" in the last 168 hours. BNP: Invalid input(s): "POCBNP" CBG: Recent Labs  Lab 01/30/23 1923 01/30/23 2315 01/31/23 0348 01/31/23 0731 01/31/23 1139  GLUCAP 143* 127* 134* 118* 117*   IMAGING RESULTS:  Imaging: CT Angio Chest Pulmonary Embolism (PE) W or WO Contrast  Result Date: 01/30/2023 CLINICAL DATA:  Chronic hypoxemia and hypercapnia related to COPD, congestive heart failure and chronic renal failure. EXAM: CT ANGIOGRAPHY CHEST WITH CONTRAST TECHNIQUE: Multidetector CT imaging of the chest was performed using the standard protocol during bolus administration of intravenous contrast. Multiplanar CT image reconstructions and MIPs were obtained to evaluate the vascular anatomy. RADIATION DOSE REDUCTION: This exam was performed according to the departmental dose-optimization program which includes automated exposure control, adjustment of the mA and/or kV according to patient size and/or use of iterative reconstruction technique. CONTRAST:  89m OMNIPAQUE IOHEXOL 350 MG/ML SOLN COMPARISON:  Portable chest obtained yesterday. Chest, abdomen and pelvis CT dated 01/18/2022. FINDINGS: Cardiovascular:  Enlarged heart. Enlarged ascending thoracic aorta measuring 5.2 cm in diameter at the level of the main pulmonary artery. Enlarged central pulmonary arteries with a main pulmonary artery measuring 3.6 cm in diameter. Maximum aortic arch diameter of 3.2 cm. Descending thoracic aortic diameter of 3.1 cm. Normally opacified pulmonary arteries with no pulmonary arterial filling defects seen. Atheromatous calcifications, including the coronary arteries and aorta. Mediastinum/Nodes: Endotracheal tube in satisfactory position. Enteric tube extending through the esophagus with its tip in the proximal to mid stomach. No enlarged lymph nodes. Unremarkable thyroid gland. Lungs/Pleura: Small to moderate-sized right pleural effusion and small left pleural effusion. Diffuse bilateral centrilobular and some paraseptal bullous changes, most pronounced in the upper lobes. Mild bilateral lower lobe dependent atelectasis. No airspace consolidation suspicious for pneumonia. No lung nodules. Upper Abdomen: The liver has mildly nodular contours with somewhat prominent lateral segment left lobe and caudate lobe and somewhat small right lobe. Two small probable cysts in the right lobe of the liver. Normal sized spleen. Small to moderate amount of ascites in the upper abdomen. Musculoskeletal: Minimal thoracic spine degenerative changes. Review of the MIP images confirms the above findings. IMPRESSION: 1. No pulmonary emboli. 2. Ascending thoracic aortic aneurysm measuring 5.2 cm in maximum diameter. Recommend semi-annual imaging followup by CTA or MRA and referral to cardiothoracic surgery if not already obtained. This recommendation follows 2010 ACCF/AHA/AATS/ACR/ASA/SCA/SCAI/SIR/STS/SVM Guidelines for the Diagnosis and Management of Patients With Thoracic Aortic Disease. Circulation. 2010; 121:[id number] Aortic aneurysm NOS (ICD10-I71.9) 3. Enlarged central pulmonary arteries compatible with pulmonary arterial hypertension. 4. Small to  moderate-sized right pleural effusion and small left pleural effusion. 5. Changes of COPD and centrilobular and paraseptal emphysema. 6. Findings suggesting cirrhosis of the liver with an associated small to moderate amount of ascites in the upper abdomen. 7.  Calcific coronary artery and aortic atherosclerosis. Aortic Atherosclerosis (ICD10-I70.0) and Emphysema (ICD10-J43.9). Electronically Signed   By: SClaudie ReveringM.D.   On: 01/30/2023 18:48   ECHOCARDIOGRAM COMPLETE  Result Date: 01/30/2023    ECHOCARDIOGRAM REPORT   Patient Name:   Satoshi VBAEHRDate of Exam: 01/30/2023 Medical Rec #:  [id number]     Height:       71.0 in Accession #:    [id number]    Weight:       223.3 lb Date of Birth:  [birth year]    BSA:          2.210 m Patient Age:    89years       BP:           163/69 mmHg Patient Gender: M  HR:           58 bpm. Exam Location:  [facility] Procedure: 2D Echo, Cardiac Doppler and Color Doppler Indications:     Elevated Troponin  History:         Patient has prior history of Echocardiogram examinations, most                  recent 10/02/2022. CHF, COPD; Risk Factors:Hypertension.                  Cardiogenic shock, ESRD.  Sonographer:     Wenda Low Referring Phys:  [id number] BRITTON L RUST-CHESTER Diagnosing Phys: Ida Rogue MD  Sonographer Comments: Suboptimal apical window and echo performed with patient supine and on artificial respirator. IMPRESSIONS  1. Left ventricular ejection fraction, by estimation, is 50 to 55%. The left ventricle has low normal function. The left ventricle has no regional wall motion abnormalities. There is mild left ventricular hypertrophy. Left ventricular diastolic parameters are consistent with Grade I diastolic dysfunction (impaired relaxation). There is the interventricular septum is flattened in systole and diastole, consistent with right ventricular pressure and volume overload.  2. Right ventricular systolic function is severely reduced. The  right ventricular size is moderately enlarged. There is severely elevated pulmonary artery systolic pressure. The estimated right ventricular systolic pressure is [id number] mmHg.  3. Right atrial size was moderately dilated.  4. The mitral valve is normal in structure. Mild mitral valve regurgitation. No evidence of mitral stenosis. There is mild late systolic prolapse of both leaflets of the mitral valve.  5. Tricuspid valve regurgitation is mild to moderate.  6. The aortic valve is tricuspid. Aortic valve regurgitation is mild to moderate. No aortic stenosis is present.  7. The inferior vena cava is dilated in size with <50% respiratory variability, suggesting right atrial pressure of 15 mmHg.  8. There is mild dilatation of the aortic root, measuring 44 mm. There is mild dilatation of the ascending aorta, measuring 43 mm. FINDINGS  Left Ventricle: Left ventricular ejection fraction, by estimation, is 50 to 55%. The left ventricle has low normal function. The left ventricle has no regional wall motion abnormalities. The left ventricular internal cavity size was normal in size. There is mild left ventricular hypertrophy. The interventricular septum is flattened in systole and diastole, consistent with right ventricular pressure and volume overload. Left ventricular diastolic parameters are consistent with Grade I diastolic dysfunction (impaired relaxation). Right Ventricle: The right ventricular size is moderately enlarged. No increase in right ventricular wall thickness. Right ventricular systolic function is severely reduced. There is severely elevated pulmonary artery systolic pressure. The tricuspid regurgitant velocity is 3.45 m/s, and with an assumed right atrial pressure of 15 mmHg, the estimated right ventricular systolic pressure is [id number] mmHg. Left Atrium: Left atrial size was normal in size. Right Atrium: Right atrial size was moderately dilated. Pericardium: There is no evidence of pericardial effusion.  Mitral Valve: The mitral valve is normal in structure. There is mild late systolic prolapse of both leaflets of the mitral valve. Mild mitral valve regurgitation. No evidence of mitral valve stenosis. MV peak gradient, 3.9 mmHg. The mean mitral valve gradient is 1.0 mmHg. Tricuspid Valve: The tricuspid valve is normal in structure. Tricuspid valve regurgitation is mild to moderate. No evidence of tricuspid stenosis. Aortic Valve: The aortic valve is tricuspid. Aortic valve regurgitation is mild to moderate. No aortic stenosis is present. Aortic valve mean gradient measures 1.0 mmHg. Aortic valve peak gradient measures 3.9 mmHg.  Aortic valve area, by VTI measures 3.79 cm. Pulmonic Valve: The pulmonic valve was normal in structure. Pulmonic valve regurgitation is mild to moderate. No evidence of pulmonic stenosis. Aorta: The aortic root is normal in size and structure. There is mild dilatation of the aortic root, measuring 44 mm. There is mild dilatation of the ascending aorta, measuring 43 mm. Venous: The inferior vena cava is dilated in size with less than 50% respiratory variability, suggesting right atrial pressure of 15 mmHg. IAS/Shunts: No atrial level shunt detected by color flow Doppler.  LEFT VENTRICLE PLAX 2D LVIDd:         4.40 cm   Diastology LVIDs:         3.00 cm   LV e' medial:    6.85 cm/s LV PW:         1.30 cm   LV E/e' medial:  6.3 LV IVS:        1.30 cm   LV e' lateral:   5.98 cm/s LVOT diam:     2.10 cm   LV E/e' lateral: 7.2 LV SV:         68 LV SV Index:   31 LVOT Area:     3.46 cm  RIGHT VENTRICLE RV Basal diam:  5.10 cm RV Mid diam:    5.90 cm RV S prime:     14.50 cm/s TAPSE (M-mode): 1.8 cm LEFT ATRIUM             Index        RIGHT ATRIUM           Index LA diam:        2.90 cm 1.31 cm/m   RA Area:     27.50 cm LA Vol (A2C):   59.3 ml 26.84 ml/m  RA Volume:   101.00 ml 45.71 ml/m LA Vol (A4C):   59.1 ml 26.74 ml/m LA Biplane Vol: 59.4 ml 26.88 ml/m  AORTIC VALVE                     PULMONIC VALVE AV Area (Vmax):    3.22 cm     PV Vmax:       1.00 m/s AV Area (Vmean):   4.15 cm     PV Peak grad:  4.0 mmHg AV Area (VTI):     3.79 cm AV Vmax:           98.70 cm/s AV Vmean:          45.500 cm/s AV VTI:            0.180 m AV Peak Grad:      3.9 mmHg AV Mean Grad:      1.0 mmHg LVOT Vmax:         91.80 cm/s LVOT Vmean:        54.500 cm/s LVOT VTI:          0.197 m LVOT/AV VTI ratio: 1.09  AORTA Ao Root diam: 4.40 cm Ao Asc diam:  4.30 cm MITRAL VALVE               TRICUSPID VALVE MV Area (PHT): 2.10 cm    TR Peak grad:   47.6 mmHg MV Area VTI:   2.38 cm    TR Vmax:        345.00 cm/s MV Peak grad:  3.9 mmHg MV Mean grad:  1.0 mmHg    SHUNTS MV Vmax:       0.99  m/s    Systemic VTI:  0.20 m MV Vmean:      42.3 cm/s   Systemic Diam: 2.10 cm MV Decel Time: 361 msec MV E velocity: 43.00 cm/s MV A velocity: 84.90 cm/s MV E/A ratio:  0.51 Ida Rogue MD Electronically signed by Ida Rogue MD Signature Date/Time: 01/30/2023/4:10:59 PM    Final    DG Abd Portable 1V  Result Date: 01/30/2023 CLINICAL DATA:  NGT placement EXAM: PORTABLE ABDOMEN - 1 VIEW COMPARISON:  01/29/2023 FINDINGS: Image of the upper abdomen demonstrates a NG tube superimposed with the stomach below the diaphragm. IMPRESSION: NG tube appropriately positioned. Electronically Signed   By: Sammie Bench M.D.   On: 01/30/2023 13:12   DG Chest Port 1 View  Result Date: 01/29/2023 CLINICAL DATA:  Intubation EXAM: PORTABLE CHEST 1 VIEW COMPARISON:  01/29/2023 8:30 a.m. FINDINGS: Interval placement of endotracheal tube, tip over the midtrachea, and esophagogastric tube, tip not visualized although below the diaphragm. Cardiomegaly. Small bilateral pleural effusions. Osseous structures unremarkable. IMPRESSION: 1. Interval placement of endotracheal tube, tip over the midtrachea, and esophagogastric tube, tip not visualized although below the diaphragm. 2. Cardiomegaly. 3. Small bilateral pleural effusions. Electronically  Signed   By: Delanna Ahmadi M.D.   On: 01/29/2023 17:03   DG Abd 1 View  Result Date: 01/29/2023 CLINICAL DATA:  Orogastric tube placement. EXAM: ABDOMEN - 1 VIEW COMPARISON:  None Available. FINDINGS: Distal tip of nasogastric tube is seen in expected position of gastroesophageal junction. No abnormal bowel dilatation is noted. IMPRESSION: Distal tip of nasogastric tube is seen in expected position of gastroesophageal junction. Advancement is recommended. Electronically Signed   By: Marijo Conception M.D.   On: 01/29/2023 17:02   @PROBHOSP$ @ CT Angio Chest Pulmonary Embolism (PE) W or WO Contrast  Result Date: 01/30/2023 CLINICAL DATA:  Chronic hypoxemia and hypercapnia related to COPD, congestive heart failure and chronic renal failure. EXAM: CT ANGIOGRAPHY CHEST WITH CONTRAST TECHNIQUE: Multidetector CT imaging of the chest was performed using the standard protocol during bolus administration of intravenous contrast. Multiplanar CT image reconstructions and MIPs were obtained to evaluate the vascular anatomy. RADIATION DOSE REDUCTION: This exam was performed according to the departmental dose-optimization program which includes automated exposure control, adjustment of the mA and/or kV according to patient size and/or use of iterative reconstruction technique. CONTRAST:  50m OMNIPAQUE IOHEXOL 350 MG/ML SOLN COMPARISON:  Portable chest obtained yesterday. Chest, abdomen and pelvis CT dated 01/18/2022. FINDINGS: Cardiovascular: Enlarged heart. Enlarged ascending thoracic aorta measuring 5.2 cm in diameter at the level of the main pulmonary artery. Enlarged central pulmonary arteries with a main pulmonary artery measuring 3.6 cm in diameter. Maximum aortic arch diameter of 3.2 cm. Descending thoracic aortic diameter of 3.1 cm. Normally opacified pulmonary arteries with no pulmonary arterial filling defects seen. Atheromatous calcifications, including the coronary arteries and aorta. Mediastinum/Nodes:  Endotracheal tube in satisfactory position. Enteric tube extending through the esophagus with its tip in the proximal to mid stomach. No enlarged lymph nodes. Unremarkable thyroid gland. Lungs/Pleura: Small to moderate-sized right pleural effusion and small left pleural effusion. Diffuse bilateral centrilobular and some paraseptal bullous changes, most pronounced in the upper lobes. Mild bilateral lower lobe dependent atelectasis. No airspace consolidation suspicious for pneumonia. No lung nodules. Upper Abdomen: The liver has mildly nodular contours with somewhat prominent lateral segment left lobe and caudate lobe and somewhat small right lobe. Two small probable cysts in the right lobe of the liver. Normal sized spleen. Small to moderate  amount of ascites in the upper abdomen. Musculoskeletal: Minimal thoracic spine degenerative changes. Review of the MIP images confirms the above findings. IMPRESSION: 1. No pulmonary emboli. 2. Ascending thoracic aortic aneurysm measuring 5.2 cm in maximum diameter. Recommend semi-annual imaging followup by CTA or MRA and referral to cardiothoracic surgery if not already obtained. This recommendation follows 2010 ACCF/AHA/AATS/ACR/ASA/SCA/SCAI/SIR/STS/SVM Guidelines for the Diagnosis and Management of Patients With Thoracic Aortic Disease. Circulation. 2010; [id number]. Aortic aneurysm NOS (ICD10-I71.9) 3. Enlarged central pulmonary arteries compatible with pulmonary arterial hypertension. 4. Small to moderate-sized right pleural effusion and small left pleural effusion. 5. Changes of COPD and centrilobular and paraseptal emphysema. 6. Findings suggesting cirrhosis of the liver with an associated small to moderate amount of ascites in the upper abdomen. 7.  Calcific coronary artery and aortic atherosclerosis. Aortic Atherosclerosis (ICD10-I70.0) and Emphysema (ICD10-J43.9). Electronically Signed   By: Claudie Revering M.D.   On: 01/30/2023 18:48     ASSESSMENT AND PLAN     -Multidisciplinary rounds held today   Circulatory shock -present on admission -Suspect this is cardiogenic due to history of CHF and cor pulmonale with recent discharge for acute CHF exacerbation.  Currently requiring Levophed support, judicious use of IV fluids due to CHF status -follow ABG and LA -follow up cultures -Patient may require central access due to Levophed infusion -No signs of acute blood loss anemia -empiric ABX  Acute Hypoxic Respiratory Failure -Patient with loud rhonchorous breathing and congestion, CXR with pleural effusions and pulmonary edema.  Blood work with cardiac biomarkers elevated including BNP over 2499, and troponin 2351 suggestive of NSTEMI versus demand ischemia -Patient high risk for respiratory arrest, poorly arousable and unable to protect own airway may require endotracheal intubation with mechanical ventilation -continue Bronchodilator Therapy  Acute decompensated systolic and diastolic CHF with EF [id number] Moderate TR and AR -Diuresis as able, currently on levophed due to shock -oxygen as needed ICU telemetry monitoring  Renal Failure-ESRD -appreciate nephrology recs -continue CRRT -follow chem 7 -follow UO -continue Foley Catheter-assess need daily -on regional heparin  Severe Coagulopathy  -patient uremic with thrombocytopenia and on ASA with supratheraputic INR. -HIT panel ordered  Toxic metabolic encephalopathy Due to uremia and ESRD -should improve with CRRT -possible ETOH withdrawal/continue prn ativan  ID -no evidence of infection -received vanc and zosyn 2/14-2/15 -resuming vanc and cefepime given persistent fever  GI/Nutrition GI PROPHYLAXIS as indicated DIET-->TF's as tolerated Constipation protocol as indicated  ENDO -ICU hypoglycemic\Hyperglycemia protocol -check FSBS per protocol  ELECTROLYTES -follow labs as needed -replace as needed -pharmacy consultation  DVT/GI PRX ordered -SCDs  TRANSFUSIONS AS  NEEDED MONITOR FSBS ASSESS the need for LABS as needed  Critical care provider statement:   Total critical care time: 60 minutes  Arcelia Jew MD [place]

## 2023-02-01 ENCOUNTER — Inpatient Hospital Stay: Payer: Medicare HMO

## 2023-02-01 DIAGNOSIS — R579 Shock, unspecified: Secondary | ICD-10-CM | POA: Diagnosis not present

## 2023-02-01 LAB — CULTURE, RESPIRATORY W GRAM STAIN: Culture: NORMAL

## 2023-02-01 LAB — RENAL FUNCTION PANEL
Albumin: 2.5 g/dL — ABNORMAL LOW (ref 3.5–5.0)
Albumin: 2.7 g/dL — ABNORMAL LOW (ref 3.5–5.0)
Anion gap: 7 (ref 5–15)
Anion gap: 6 (ref 5–15)
BUN: 44 mg/dL — ABNORMAL HIGH (ref 8–23)
BUN: 35 mg/dL — ABNORMAL HIGH (ref 8–23)
CO2: 25 mmol/L (ref 22–32)
CO2: 25 mmol/L (ref 22–32)
Calcium: 8.8 mg/dL — ABNORMAL LOW (ref 8.9–10.3)
Calcium: 9.2 mg/dL (ref 8.9–10.3)
Chloride: 101 mmol/L (ref 98–111)
Chloride: 103 mmol/L (ref 98–111)
Creatinine, Ser: 2.24 mg/dL — ABNORMAL HIGH (ref 0.61–1.24)
Creatinine, Ser: 1.78 mg/dL — ABNORMAL HIGH (ref 0.61–1.24)
GFR, Estimated: 29 mL/min — ABNORMAL LOW (ref >60)
GFR, Estimated: 39 mL/min — ABNORMAL LOW (ref >60)
Glucose, Bld: 167 mg/dL — ABNORMAL HIGH (ref 70–99)
Glucose, Bld: 224 mg/dL — ABNORMAL HIGH (ref 70–99)
Phosphorus: 2.8 mg/dL (ref 2.5–4.6)
Phosphorus: 2.9 mg/dL (ref 2.5–4.6)
Potassium: 3.5 mmol/L (ref 3.5–5.1)
Potassium: 3.6 mmol/L (ref 3.5–5.1)
Sodium: 133 mmol/L — ABNORMAL LOW (ref 135–145)
Sodium: 134 mmol/L — ABNORMAL LOW (ref 135–145)

## 2023-02-01 LAB — HEPARIN INDUCED PLATELET AB (HIT ANTIBODY): Heparin Induced Plt Ab: 0.446 OD — ABNORMAL HIGH (ref 0.000–0.400)

## 2023-02-01 LAB — GLUCOSE, CAPILLARY
Glucose-Capillary: 126 mg/dL — ABNORMAL HIGH (ref 70–99)
Glucose-Capillary: 147 mg/dL — ABNORMAL HIGH (ref 70–99)
Glucose-Capillary: 154 mg/dL — ABNORMAL HIGH (ref 70–99)
Glucose-Capillary: 168 mg/dL — ABNORMAL HIGH (ref 70–99)
Glucose-Capillary: 178 mg/dL — ABNORMAL HIGH (ref 70–99)
Glucose-Capillary: 187 mg/dL — ABNORMAL HIGH (ref 70–99)

## 2023-02-01 LAB — CBC
HCT: 46.2 % (ref 39.0–52.0)
Hemoglobin: 14.5 g/dL (ref 13.0–17.0)
MCH: 32.4 pg (ref 26.0–34.0)
MCHC: 31.4 g/dL (ref 30.0–36.0)
MCV: 103.1 fL — ABNORMAL HIGH (ref 80.0–100.0)
Platelets: 32 10*3/uL — ABNORMAL LOW (ref 150–400)
RBC: 4.48 MIL/uL (ref 4.22–5.81)
RDW: 16.1 % — ABNORMAL HIGH (ref 11.5–15.5)
WBC: 7.8 10*3/uL (ref 4.0–10.5)
nRBC: 0 % (ref 0.0–0.2)

## 2023-02-01 LAB — APTT: aPTT: 33 seconds (ref 24–36)

## 2023-02-01 LAB — MAGNESIUM: Magnesium: 2 mg/dL (ref 1.7–2.4)

## 2023-02-01 MED ORDER — MIDAZOLAM BOLUS VIA INFUSION: 0.0000 mg | INTRAVENOUS | Status: DC | PRN

## 2023-02-01 MED ORDER — MIDAZOLAM-SODIUM CHLORIDE 100-0.9 MG/100ML-% IV SOLN
0.0000 mg/h | INTRAVENOUS | Status: DC
  Administered 2023-02-01: 2 mg/h via INTRAVENOUS
  Filled 2023-02-01: qty 100

## 2023-02-01 MED ORDER — HYDROCORTISONE SOD SUC (PF) 100 MG IJ SOLR
100.0000 mg | Freq: Two times a day (BID) | INTRAMUSCULAR | Status: DC
  Administered 2023-02-01 – 2023-02-03 (×6): 100 mg via INTRAVENOUS
  Filled 2023-02-01 (×6): qty 2

## 2023-02-01 NOTE — Progress Notes (Signed)
Pharmacy Antibiotic Note  John [name] is a 77 y.o. male admitted on 01/29/2023 with sepsis, respiatory failure with pleural effusions, acute decompensated heart failure and renal failure on CRRT. New fevers on 2/16 PM. Pharmacy has been consulted for cefepime dosing.   On CRRT with Effluent rate 25 mL/kg/hr.  Plan: Cefepime 2 grams every 12 hours for CRRT   Follow CRRT / renal function for dose adjustments and culture finalization.  Height: 5' 11"$  (180.3 cm) Weight: 98 kg (216 lb 0.8 oz) IBW/kg (Calculated) : 75.3  Temp (24hrs), Avg:98.6 F (37 C), Min:97 F (36.1 C), Max:100.2 F (37.9 C)  Recent Labs  Lab 01/29/23 0702 01/29/23 2046 01/29/23 2227 01/30/23 0455 01/30/23 1612 01/31/23 0358 01/31/23 1114 02/01/23 0500  WBC 8.6  --  11.4* 11.4*  --  9.4  --  7.8  CREATININE 6.11*   < >  --  4.93* 3.70* 2.96* 3.06* 2.24*   < > = values in this interval not displayed.     Estimated Creatinine Clearance: 33 mL/min (A) (by C-G formula based on SCr of 2.24 mg/dL (H)).    No Known Allergies  Antimicrobials this admission: vancomycin 2/14 >> 2/15 Zosyn 2/14 >> 2/15 Unasyn 2/15 >> 2/16 Cefepime 2/16 >>  Dose adjustments this admission: N/a  Microbiology results: 2/14 BCx: NG x 3d 2/14 MRSA PCR: Not detected 2/16 Tracheal aspirate: no organisms, reincubated  Thank you for allowing pharmacy to be a part of this patient's care.  Raford Brissett A 02/01/2023 8:27 AM

## 2023-02-01 NOTE — Progress Notes (Signed)
CRRT Filter filtration rate too high. Currently returning blood.

## 2023-02-01 NOTE — Progress Notes (Signed)
PHARMACY CONSULT NOTE   Pharmacy Consult for Electrolyte Monitoring and Replacement   Recent Labs: Potassium (mmol/L)  Date Value  02/01/2023 3.5   Magnesium (mg/dL)  Date Value  02/01/2023 2.0   Calcium (mg/dL)  Date Value  02/01/2023 8.8 (L)   Albumin (g/dL)  Date Value  02/01/2023 2.5 (L)   Phosphorus (mg/dL)  Date Value  02/01/2023 2.8   Sodium (mmol/L)  Date Value  02/01/2023 133 (L)    Assessment: 77 year old male PMH HTN, CKD4, diastolic and systolic heart failure with cor pulmonale, COPD on 2L PTA, history ascending aortic aneurysm, CAD. CRRT with effluent rate 25 mL/kg/hr.  Pressor support with Levophed and vasopressin.  Nutrition: tube feeds  -on CRRT  Goal of Therapy:  K > 4 Mag > 2 All other electrolytes within normal limits  Plan:  No replacement warranted Nephrology following on CRRT Electrolytes @0500$  and @1600$  while on CRRT  Noralee Space ,PharmD Clinical Pharmacist 02/01/2023 8:23 AM

## 2023-02-01 NOTE — Consult Note (Signed)
Pharmacy Heparin Induced Thrombocytopenia (HIT) Note:  John [name] is an 77 y.o. male being evaluated for HIT. Heparin was started 01/30/23 for CRRT circuit, and baseline platelets were 91.   HIT labs were ordered on 02/01/23 when platelets dropped to 44.  Auto-populate labs:  Heparin Induced Plt Ab  Date/Time Value Ref Range Status  01/31/2023 11:14 AM 0.446 (H) 0.000 - 0.400 OD Final    Comment:    (NOTE) Performed At: [facility] [place], [place] [id number] Rush Farmer MD [id number]      CALCULATE SCORE:  4Ts (see the HIT Algorithm) Score  Thrombocytopenia 2  Timing 1  Thrombosis 0  Other causes of thrombocytopenia 1  Total 4     Recommendations (A or B) are based on available lab results (HIT antibody and/or SRA) and the HIT algorithm    A. HIT antibody result available  HIT ruled out   No SRA needed Continue heparin / LMWH No allergy documentation needed   B. SRA result availability  SRA not available   Name of MD Contacted: N/A  Plan (Discussed with provider) Labs ordered:  SRA not needed  Heparin allergy:  No allergy documentation needed. Anticoagulation plans:  Continue heparin / LMWH    Lorin Picket, PharmD 02/01/2023, 5:43 PM

## 2023-02-01 NOTE — Progress Notes (Signed)
CRITICAL CARE     Name: John [name] MRN: [id number] DOB: Jul 08, 1946     LOS: 3   SUBJECTIVE FINDINGS & SIGNIFICANT EVENTS    History of presenting illness:    This is 77 year old male with a history of essential hypertension, on hydralazine and Cozaar, stage IV CKD but has not been on dialysis in the past, has chronic diastolic and systolic heart failure with cor pulmonale, moderate tricuspid regurgitation and aortic valve regurgitation, history of a ascending aortic aneurysm, coronary artery disease with atherosclerosis, COPD and chronic hypoxemia baseline 2 L/min supplemental oxygen at home with recurrent episodes of hypoxemia and remote acute exacerbation of COPD with hypoxemia and hypercapnia 2 months ago history of ruptured left quadricep tendon 2 years ago, chronic thrombocytopenia and erythrocytosis recurrent lower extremity cellulitis, recurrent hyperkalemia, morbid obesity history of anasarca dyslipidemia, recent admission for acute CHF exacerbation requiring Lasix drip and BiPAP support, who was brought in by EMS due to worsening altered mental status and circulatory shock.  I was able to meet with wife at bedside during my evaluation who shares patient has been sleeping majority of each day over the last week has been on arousable and received phone call from medical provider regarding worsening kidney function which prompted ER evaluation.  He had chest x-ray performed while in the ER showing bilateral pleural effusions with vascular congestion and atelectasis/infiltrate.  Patient in circulatory shock with Levophed support on admission in ER.  PCCM consultation for admission to medical intensive care unit with circulatory shock consistent with acute on chronic renal failure with CHF exacerbation.  01/30/23- I  met with girlfriend of patient today to review short term medical plan.  He had Echo whith RV dysfunction and concern for PE.  We have started CRRT yesterday with additional interval improvement. He will have CTPE today.  He is weaned down on pressors today on vasor 0.4 and 4 of levophed reduced from 36 this morning.  01/31/23- remains agitated intermittently overnight. Continued issues with clot burden through CRRT circuit.  02/01/23- remains agitated overnight. Requiring numerous ativan pushes. Attempting to wean down sedation. Failed SBT miserably.  Lines/tubes :   Microbiology/Sepsis markers: Results for orders placed or performed during the hospital encounter of 01/29/23  Culture, Respiratory w Gram Stain     Status: None (Preliminary result)   Collection Time: 01/29/23  3:45 AM   Specimen: Tracheal Aspirate; Respiratory  Result Value Ref Range Status   Specimen Description   Final    TRACHEAL ASPIRATE Performed at [facility], [address] [place], [place] [address]    Special Requests   Final    NONE Performed at [facility], [address] [place], [place] [address]    Gram Stain   Final    MODERATE WBC PRESENT,BOTH PMN AND MONONUCLEAR NO ORGANISMS SEEN    Culture   Final    CULTURE REINCUBATED FOR BETTER GROWTH Performed at [facility], [place] [address], [place], [place] [address]    Report Status PENDING  Incomplete  Culture, blood (Routine X 2) w Reflex to ID Panel     Status: None (Preliminary result)   Collection Time: 01/29/23  1:10 PM   Specimen: BLOOD  Result Value Ref Range Status   Specimen Description BLOOD BLOOD LEFT ARM  Final   Special Requests   Final    BOTTLES DRAWN AEROBIC AND ANAEROBIC Blood Culture adequate volume   Culture   Final    NO GROWTH 3  DAYS Performed at [facility], [address] [place], [place] [address]    Report Status PENDING  Incomplete  MRSA Next Gen by PCR, Nasal     Status: None    Collection Time: 01/29/23  1:47 PM   Specimen: Nasal Mucosa; Nasal Swab  Result Value Ref Range Status   MRSA by PCR Next Gen NOT DETECTED NOT DETECTED Final    Comment: (NOTE) The GeneXpert MRSA Assay (FDA approved for NASAL specimens only), is one component of a comprehensive MRSA colonization surveillance program. It is not intended to diagnose MRSA infection nor to guide or monitor treatment for MRSA infections. Test performance is not FDA approved in patients less than 76 years old. Performed at [facility], [address] [place], [place] [address]   Culture, blood (Routine X 2) w Reflex to ID Panel     Status: None (Preliminary result)   Collection Time: 01/29/23  2:10 PM   Specimen: BLOOD  Result Value Ref Range Status   Specimen Description BLOOD A-LINE  Final   Special Requests   Final    BOTTLES DRAWN AEROBIC AND ANAEROBIC Blood Culture adequate volume   Culture   Final    NO GROWTH 3 DAYS Performed at [facility], [address] [place], [place] [address]    Report Status PENDING  Incomplete    Anti-infectives:  Anti-infectives (From admission, onward)    Start     Dose/Rate Route Frequency Ordered Stop   01/31/23 1700  ceFEPIme (MAXIPIME) 2 g in sodium chloride 0.9 % 100 mL IVPB        2 g 200 mL/hr over 30 Minutes Intravenous Every 12 hours 01/31/23 1555     01/30/23 1700  vancomycin (VANCOREADY) IVPB 1250 mg/250 mL  Status:  Discontinued        1,250 mg 166.7 mL/hr over 90 Minutes Intravenous Every 24 hours 01/29/23 1543 01/30/23 1036   01/30/23 1300  Ampicillin-Sulbactam (UNASYN) 3 g in sodium chloride 0.9 % 100 mL IVPB  Status:  Discontinued        3 g 200 mL/hr over 30 Minutes Intravenous Every 8 hours 01/30/23 1036 01/31/23 1118   01/30/23 0000  vancomycin variable dose per unstable renal function (pharmacist dosing)  Status:  Discontinued         Does not apply See admin instructions 01/29/23 1125 01/29/23 1548   01/29/23 2200   piperacillin-tazobactam (ZOSYN) IVPB 3.375 g  Status:  Discontinued        3.375 g 12.5 mL/hr over 240 Minutes Intravenous Every 6 hours 01/29/23 1547 01/29/23 1548   01/29/23 2200  piperacillin-tazobactam (ZOSYN) IVPB 3.375 g  Status:  Discontinued        3.375 g 100 mL/hr over 30 Minutes Intravenous Every 6 hours 01/29/23 1548 01/30/23 1036   01/29/23 2100  piperacillin-tazobactam (ZOSYN) IVPB 2.25 g  Status:  Discontinued        2.25 g 100 mL/hr over 30 Minutes Intravenous Every 8 hours 01/29/23 1125 01/29/23 1547   01/29/23 1115  piperacillin-tazobactam (ZOSYN) IVPB 3.375 g        3.375 g 100 mL/hr over 30 Minutes Intravenous  Once 01/29/23 1103 01/29/23 1457   01/29/23 1115  vancomycin (VANCOREADY) IVPB 2000 mg/400 mL        2,000 mg 200 mL/hr over 120 Minutes Intravenous  Once 01/29/23 1104 01/29/23 1656        PAST MEDICAL HISTORY   Past Medical History:  Diagnosis Date  CHF (congestive heart failure) (HCC)    EF 45% in 2021   Chronic kidney disease    COPD (chronic obstructive pulmonary disease) ([facility] [place]) 02/16/2020   Hyperlipidemia    Hypertension    Pneumonia 2021     SURGICAL HISTORY   Past Surgical History:  Procedure Laterality Date   CATARACT EXTRACTION W/PHACO Left 10/31/2021   Procedure: CATARACT EXTRACTION PHACO AND INTRAOCULAR LENS PLACEMENT ([place]) LEFT VISION BLUE 3.45 00:48.0;  Surgeon: Leandrew Koyanagi, MD;  Location: [facility];  Service: Ophthalmology;  Laterality: Left;   CATARACT EXTRACTION W/PHACO Right 11/14/2021   Procedure: CATARACT EXTRACTION PHACO AND INTRAOCULAR LENS PLACEMENT ([facility]) RIGHT 8.59 01:10.5;  Surgeon: Leandrew Koyanagi, MD;  Location: [facility];  Service: Ophthalmology;  Laterality: Right;   INGUINAL HERNIA REPAIR Right 02/04/2017   Procedure: HERNIA REPAIR INGUINAL ADULT;  Surgeon: Leonie Green, MD;  Location: [facility];  Service: General;  Laterality: Right;   NO PAST SURGERIES     QUADRICEPS TENDON  REPAIR Left 02/15/2020   Procedure: REPAIR QUADRICEP TENDON;  Surgeon: Corky Mull, MD;  Location: [facility];  Service: Orthopedics;  Laterality: Left;     FAMILY HISTORY   Family History  Problem Relation Age of Onset   Diabetes Mother    Diabetes Brother    Diabetes Maternal Grandmother    Diabetes Paternal Grandmother      SOCIAL HISTORY   Social History   Tobacco Use   Smoking status: Former    Packs/day: 0.25    Years: 53.00    Total pack years: 13.25    Types: Cigarettes    Quit date: 02/15/2015    Years since quitting: 7.9   Smokeless tobacco: Never  Substance Use Topics   Alcohol use: Yes    Comment: occassional   Drug use: No     MEDICATIONS   Current Medication:  Current Facility-Administered Medications:    0.9 %  sodium chloride infusion, 250 mL, Intravenous, Continuous, Aleskerov, Fuad, MD, Last Rate: 5 mL/hr at 02/01/23 0700, Infusion Verify at 02/01/23 0700   aspirin chewable tablet 81 mg, 81 mg, Per Tube, Daily, Wynelle Cleveland, RPH, 81 mg at 01/31/23 1231   ceFEPIme (MAXIPIME) 2 g in sodium chloride 0.9 % 100 mL IVPB, 2 g, Intravenous, Q12H, Wynelle Cleveland, RPH, Stopped at 02/01/23 0525   Chlorhexidine Gluconate Cloth 2 % PADS 6 each, 6 each, Topical, Daily, Rust-Chester, Britton L, NP, 6 each at 01/31/23 0941   docusate (COLACE) 50 MG/5ML liquid 100 mg, 100 mg, Per Tube, BID, Rust-Chester, Toribio Harbour L, NP, 100 mg at 01/31/23 2127   docusate sodium (COLACE) capsule 100 mg, 100 mg, Oral, BID PRN, Ottie [place]ier, MD   feeding supplement (PROSource TF20) liquid 60 mL, 60 mL, Per Tube, BID, Aleskerov, Fuad, MD, 60 mL at 01/31/23 2127   feeding supplement (VITAL AF 1.2 CAL) liquid 1,000 mL, 1,000 mL, Per Tube, Continuous, Faydra Korman, Earley Abide, MD, Last Rate: 60 mL/hr at 01/31/23 1800, Infusion Verify at 01/31/23 1800   fentaNYL (SUBLIMAZE) bolus via infusion 50 mcg, 50 mcg, Intravenous, Q1H PRN, Rust-Chester, Britton L, NP, 50 mcg at 01/31/23 2123   fentaNYL  2547mg in NS 2514m(1040mml) infusion-PREMIX, 0-200 mcg/hr, Intravenous, Continuous, Rust-Chester, Britton L, NP, Last Rate: 7.5 mL/hr at 02/01/23 0725, 75 mcg/hr at [id number] acid (FOLVITE) tablet 1 mg, 1 mg, Per Tube, Daily, Rust-Chester, Britton L, NP, 1 mg at 01/31/23 0940   free water 30 mL, 30 mL,  Per Tube, Q4H, Ottie [place]ier, MD, 30 mL at 02/01/23 0815   gabapentin (NEURONTIN) 250 MG/5ML solution 100 mg, 100 mg, Per Tube, QHS, Wynelle Cleveland, RPH, 100 mg at 01/31/23 2127   heparin 10,000 units/ 20 mL infusion syringe, 500-1,000 Units/hr, CRRT, Continuous, Rust-Chester, Britton L, NP, Last Rate: 1 mL/hr at 02/01/23 0800, 500 Units/hr at 02/01/23 0800   heparin injection 1,000-6,000 Units, 1,000-6,000 Units, CRRT, PRN, Kolluru, Sarath, MD, 4,000 Units at 02/01/23 0035   hydrocortisone sodium succinate (SOLU-CORTEF) 100 MG injection 100 mg, 100 mg, Intravenous, Q12H, Darel Hong D, NP, 100 mg at 02/01/23 0105   LORazepam (ATIVAN) tablet 1 mg, 1 mg, Per Tube, Q6H, Rust-Chester, Britton L, NP, 1 mg at 02/01/23 0604   midazolam (VERSED) 100 mg/100 mL (1 mg/mL) premix infusion, 0-10 mg/hr, Intravenous, Continuous, Darel Hong D, NP, Last Rate: 1 mL/hr at 02/01/23 0726, 1 mg/hr at 02/01/23 0726   midazolam (VERSED) bolus via infusion 0-5 mg, 0-5 mg, Intravenous, Q1H PRN, Darel Hong D, NP   multivitamin (RENA-VIT) tablet 1 tablet, 1 tablet, Per Tube, QHS, Ottie [place]ier, MD, 1 tablet at 01/31/23 2126   norepinephrine (LEVOPHED) 16 mg in 263m (0.064 mg/mL) premix infusion, 0-40 mcg/min, Intravenous, Continuous, Rust-Chester, Britton L, NP, Last Rate: 1.88 mL/hr at 02/01/23 0811, 2 mcg/min at 02/01/23 [id number]  nutrition supplement (JUVEN) (JUVEN) powder packet 1 packet, 1 packet, Per Tube, BID BM, AOttie [place]azier MD, 1 packet at 01/31/23 1433   Oral care mouth rinse, 15 mL, Mouth Rinse, Q2H, Aleskerov, Fuad, MD, 15 mL at 02/01/23 [id number]  Oral care mouth rinse, 15 mL, Mouth  Rinse, PRN, ALanney Gins Fuad, MD   pantoprazole (PROTONIX) injection 40 mg, 40 mg, Intravenous, Daily, Rust-Chester, Britton L, NP, 40 mg at 01/31/23 0940   polyethylene glycol (MIRALAX / GLYCOLAX) packet 17 g, 17 g, Oral, Daily PRN, ALanney Gins Fuad, MD   polyethylene glycol (MIRALAX / GLYCOLAX) packet 17 g, 17 g, Per Tube, Daily, Rust-Chester, Britton L, NP, 17 g at 01/31/23 0941   prismasol BGK 2/2.5 dialysis solution, , CRRT, Continuous, Kolluru, Sarath, MD, Last Rate: 2,000 mL/hr at 01/31/23 2156, New Bag at 01/31/23 2156   prismasol BGK 2/2.5 replacement solution, , CRRT, Continuous, Kolluru, Sarath, MD, Last Rate: 400 mL/hr at 01/31/23 1200, New Bag at 01/31/23 1200   prismasol BGK 2/2.5 replacement solution, , CRRT, Continuous, Kolluru, Sarath, MD, Last Rate: 400 mL/hr at 01/31/23 1200, New Bag at 01/31/23 1200   sodium chloride 0.9 % primer fluid for CRRT, , CRRT, PRN, Kolluru, Sarath, MD   thiamine (VITAMIN B1) injection 100 mg, 100 mg, Intravenous, Daily, Rust-Chester, BToribio HarbourL, NP, 100 mg at 01/31/23 0940   vasopressin (PITRESSIN) 20 Units in sodium chloride 0.9 % 100 mL infusion-*FOR SHOCK*, 0-0.04 Units/min, Intravenous, Continuous, Rust-Chester, Britton L, NP, Last Rate: 12 mL/hr at 02/01/23 0700, 0.04 Units/min at 02/01/23 0700    ALLERGIES   Patient has no known allergies.    REVIEW OF SYSTEMS    Unable to obtain ROS due to unresponsive mental status.  PHYSICAL EXAMINATION   Vital Signs: Temp:  [97 F (36.1 C)-100.2 F (37.9 C)] 97 F (36.1 C) (02/17 0700) Pulse Rate:  [30-112] 48 (02/17 0700) Resp:  [9-26] 16 (02/17 0700) BP: (91-144)/(63-92) 109/77 (02/17 0700) SpO2:  [94 %-100 %] 98 % (02/17 0700) Arterial Line BP: (88-159)/(55-94) 113/68 (02/17 0630) FiO2 (%):  [28 %-40 %] 28 % (02/17 0715) Weight:  [98 kg] 98 kg (02/17 0500)  GENERAL: Age-appropriate,  unable to arouse patient with sternal rub no apparent distress HEAD: Normocephalic, atraumatic.  EYES:  Pupils equal, round, reactive to light.  No scleral icterus.  MOUTH: Moist mucosal membrane. NECK: Supple. No thyromegaly. No nodules. No JVD.  PULMONARY: Rhonchorous breathing with crepitations bilaterally CARDIOVASCULAR: S1 and S2. Regular rate and rhythm. No murmurs, rubs, or gallops.  GASTROINTESTINAL: Soft, nontender, non-distended. No masses. Positive bowel sounds. No hepatosplenomegaly.  MUSCULOSKELETAL: Chronic stasis dermatitis with 2+ edema bilaterally NEUROLOGIC: GCS 6, poorly arousable SKIN:intact,warm,dry   PERTINENT DATA     Infusions:  sodium chloride 5 mL/hr at 02/01/23 0700   ceFEPime (MAXIPIME) IV Stopped (02/01/23 0525)   feeding supplement (VITAL AF 1.2 CAL) 60 mL/hr at 01/31/23 1800   fentaNYL infusion INTRAVENOUS 75 mcg/hr (02/01/23 0725)   heparin 10,000 units/ 20 mL infusion syringe 500 Units/hr (02/01/23 0800)   midazolam 1 mg/hr (02/01/23 0726)   norepinephrine (LEVOPHED) Adult infusion 2 mcg/min (02/01/23 0811)   prismasol BGK 2/2.5 dialysis solution 2,000 mL/hr at 01/31/23 2156   prismasol BGK 2/2.5 replacement solution 400 mL/hr at 01/31/23 1200   prismasol BGK 2/2.5 replacement solution 400 mL/hr at 01/31/23 1200   vasopressin 0.04 Units/min (02/01/23 0700)   Scheduled Medications:  aspirin  81 mg Per Tube Daily   Chlorhexidine Gluconate Cloth  6 each Topical Daily   docusate  100 mg Per Tube BID   feeding supplement (PROSource TF20)  60 mL Per Tube BID   folic acid  1 mg Per Tube Daily   free water  30 mL Per Tube Q4H   gabapentin  100 mg Per Tube QHS   hydrocortisone sod succinate (SOLU-CORTEF) inj  100 mg Intravenous Q12H   LORazepam  1 mg Per Tube Q6H   multivitamin  1 tablet Per Tube QHS   nutrition supplement (JUVEN)  1 packet Per Tube BID BM   mouth rinse  15 mL Mouth Rinse Q2H   pantoprazole (PROTONIX) IV  40 mg Intravenous Daily   polyethylene glycol  17 g Per Tube Daily   thiamine (VITAMIN B1) injection  100 mg Intravenous Daily    PRN Medications: docusate sodium, fentaNYL, heparin, midazolam, mouth rinse, polyethylene glycol, sodium chloride Hemodynamic parameters:   Intake/Output: 02/16 0701 - 02/17 0700 In: 1968.2 [I.V.:1012.3; NG/GT:756; IV Piggyback:199.9] Out: 2905 [Urine:464; Emesis/NG output:285]  Ventilator  Settings: Vent Mode: PRVC FiO2 (%):  [28 %-40 %] 28 % Set Rate:  [16 bmp] 16 bmp Vt Set:  [500 mL] 500 mL PEEP:  [5 cmH20] 5 cmH20 Plateau Pressure:  [15 cmH20] 15 cmH20  LAB RESULTS:  Basic Metabolic Panel: Recent Labs  Lab 01/29/23 2046 01/29/23 2358 01/30/23 0455 01/30/23 1612 01/31/23 0358 01/31/23 1114 02/01/23 0500  NA 134*  --  137 134* 136 135 133*  K 5.5*   < > 4.7 4.2 4.2 4.1 3.5  CL 92*  --  96* 99 101 99 101  CO2 27  --  28 25 24 26 25  $ GLUCOSE 149*  --  123* 132* 133* 124* 167*  BUN 129*  --  107* 78* 62* 58* 44*  CREATININE 6.14*  --  4.93* 3.70* 2.96* 3.06* 2.24*  CALCIUM 9.1  --  9.2 9.1 9.4 8.9 8.8*  MG 2.5*  --  2.3  --  1.8  --  2.0  PHOS 8.5*  --  6.1* 5.3* 4.5 4.1 2.8   < > = values in this interval not displayed.   Liver Function Tests: Recent Labs  Lab  01/29/23 0702 01/29/23 2046 01/30/23 0455 01/30/23 1612 01/31/23 0358 01/31/23 1114 02/01/23 0500  AST 36  --   --  23  --   --   --   ALT 13  --   --  12  --   --   --   ALKPHOS 75  --   --  50  --   --   --   BILITOT 2.1*  --   --  4.3*  --   --   --   PROT 7.1  --   --  5.8*  --   --   --   ALBUMIN 3.7   < > 3.3* 3.0*  3.0* 3.0* 2.5* 2.5*   < > = values in this interval not displayed.   No results for input(s): "LIPASE", "AMYLASE" in the last 168 hours. No results for input(s): "AMMONIA" in the last 168 hours. CBC: Recent Labs  Lab 01/29/23 0702 01/29/23 2227 01/30/23 0455 01/31/23 0358 02/01/23 0500  WBC 8.6 11.4* 11.4* 9.4 7.8  NEUTROABS 6.8  --   --   --   --   HGB 18.3* 16.8 17.5* 16.5 14.5  HCT 59.8* 54.3* 56.0* 51.3 46.2  MCV 106.8* 105.2* 104.7* 103.2* 103.1*  PLT 80* 91*  78* 44* 32*   Cardiac Enzymes: No results for input(s): "CKTOTAL", "CKMB", "CKMBINDEX", "TROPONINI" in the last 168 hours. BNP: Invalid input(s): "POCBNP" CBG: Recent Labs  Lab 01/31/23 1541 01/31/23 1918 01/31/23 2339 02/01/23 0357 02/01/23 0804  GLUCAP 135* 141* 75 154* 126*   IMAGING RESULTS:  Imaging: CT Angio Chest Pulmonary Embolism (PE) W or WO Contrast  Result Date: 01/30/2023 CLINICAL DATA:  Chronic hypoxemia and hypercapnia related to COPD, congestive heart failure and chronic renal failure. EXAM: CT ANGIOGRAPHY CHEST WITH CONTRAST TECHNIQUE: Multidetector CT imaging of the chest was performed using the standard protocol during bolus administration of intravenous contrast. Multiplanar CT image reconstructions and MIPs were obtained to evaluate the vascular anatomy. RADIATION DOSE REDUCTION: This exam was performed according to the departmental dose-optimization program which includes automated exposure control, adjustment of the mA and/or kV according to patient size and/or use of iterative reconstruction technique. CONTRAST:  60m OMNIPAQUE IOHEXOL 350 MG/ML SOLN COMPARISON:  Portable chest obtained yesterday. Chest, abdomen and pelvis CT dated 01/18/2022. FINDINGS: Cardiovascular: Enlarged heart. Enlarged ascending thoracic aorta measuring 5.2 cm in diameter at the level of the main pulmonary artery. Enlarged central pulmonary arteries with a main pulmonary artery measuring 3.6 cm in diameter. Maximum aortic arch diameter of 3.2 cm. Descending thoracic aortic diameter of 3.1 cm. Normally opacified pulmonary arteries with no pulmonary arterial filling defects seen. Atheromatous calcifications, including the coronary arteries and aorta. Mediastinum/Nodes: Endotracheal tube in satisfactory position. Enteric tube extending through the esophagus with its tip in the proximal to mid stomach. No enlarged lymph nodes. Unremarkable thyroid gland. Lungs/Pleura: Small to moderate-sized right  pleural effusion and small left pleural effusion. Diffuse bilateral centrilobular and some paraseptal bullous changes, most pronounced in the upper lobes. Mild bilateral lower lobe dependent atelectasis. No airspace consolidation suspicious for pneumonia. No lung nodules. Upper Abdomen: The liver has mildly nodular contours with somewhat prominent lateral segment left lobe and caudate lobe and somewhat small right lobe. Two small probable cysts in the right lobe of the liver. Normal sized spleen. Small to moderate amount of ascites in the upper abdomen. Musculoskeletal: Minimal thoracic spine degenerative changes. Review of the MIP images confirms the above findings. IMPRESSION: 1. No pulmonary  emboli. 2. Ascending thoracic aortic aneurysm measuring 5.2 cm in maximum diameter. Recommend semi-annual imaging followup by CTA or MRA and referral to cardiothoracic surgery if not already obtained. This recommendation follows 2010 ACCF/AHA/AATS/ACR/ASA/SCA/SCAI/SIR/STS/SVM Guidelines for the Diagnosis and Management of Patients With Thoracic Aortic Disease. Circulation. 2010; [id number]. Aortic aneurysm NOS (ICD10-I71.9) 3. Enlarged central pulmonary arteries compatible with pulmonary arterial hypertension. 4. Small to moderate-sized right pleural effusion and small left pleural effusion. 5. Changes of COPD and centrilobular and paraseptal emphysema. 6. Findings suggesting cirrhosis of the liver with an associated small to moderate amount of ascites in the upper abdomen. 7.  Calcific coronary artery and aortic atherosclerosis. Aortic Atherosclerosis (ICD10-I70.0) and Emphysema (ICD10-J43.9). Electronically Signed   By: Claudie Revering M.D.   On: 01/30/2023 18:48   ECHOCARDIOGRAM COMPLETE  Result Date: 01/30/2023    ECHOCARDIOGRAM REPORT   Patient Name:   John [name] Date of Exam: 01/30/2023 Medical Rec #:  [id number]      Height:       71.0 in Accession #:    [id number]     Weight:       223.3 lb Date of Birth:   1946-09-28     BSA:          2.210 m Patient Age:    41 years       BP:           163/69 mmHg Patient Gender: M              HR:           58 bpm. Exam Location:  [facility] Procedure: 2D Echo, Cardiac Doppler and Color Doppler Indications:     Elevated Troponin  History:         Patient has prior history of Echocardiogram examinations, most                  recent 10/02/2022. CHF, COPD; Risk Factors:Hypertension.                  Cardiogenic shock, ESRD.  Sonographer:     Wenda Low Referring Phys:  [id number] BRITTON L RUST-CHESTER Diagnosing Phys: Ida Rogue MD  Sonographer Comments: Suboptimal apical window and echo performed with patient supine and on artificial respirator. IMPRESSIONS  1. Left ventricular ejection fraction, by estimation, is 50 to 55%. The left ventricle has low normal function. The left ventricle has no regional wall motion abnormalities. There is mild left ventricular hypertrophy. Left ventricular diastolic parameters are consistent with Grade I diastolic dysfunction (impaired relaxation). There is the interventricular septum is flattened in systole and diastole, consistent with right ventricular pressure and volume overload.  2. Right ventricular systolic function is severely reduced. The right ventricular size is moderately enlarged. There is severely elevated pulmonary artery systolic pressure. The estimated right ventricular systolic pressure is [id number] mmHg.  3. Right atrial size was moderately dilated.  4. The mitral valve is normal in structure. Mild mitral valve regurgitation. No evidence of mitral stenosis. There is mild late systolic prolapse of both leaflets of the mitral valve.  5. Tricuspid valve regurgitation is mild to moderate.  6. The aortic valve is tricuspid. Aortic valve regurgitation is mild to moderate. No aortic stenosis is present.  7. The inferior vena cava is dilated in size with <50% respiratory variability, suggesting right atrial pressure of 15 mmHg.  8. There is  mild dilatation of the aortic root, measuring 44 mm. There is mild dilatation of the ascending aorta, measuring  43 mm. FINDINGS  Left Ventricle: Left ventricular ejection fraction, by estimation, is 50 to 55%. The left ventricle has low normal function. The left ventricle has no regional wall motion abnormalities. The left ventricular internal cavity size was normal in size. There is mild left ventricular hypertrophy. The interventricular septum is flattened in systole and diastole, consistent with right ventricular pressure and volume overload. Left ventricular diastolic parameters are consistent with Grade I diastolic dysfunction (impaired relaxation). Right Ventricle: The right ventricular size is moderately enlarged. No increase in right ventricular wall thickness. Right ventricular systolic function is severely reduced. There is severely elevated pulmonary artery systolic pressure. The tricuspid regurgitant velocity is 3.45 m/s, and with an assumed right atrial pressure of 15 mmHg, the estimated right ventricular systolic pressure is [id number]. Left Atrium: Left atrial size was normal in size. Right Atrium: Right atrial size was moderately dilated. Pericardium: There is no evidence of pericardial effusion. Mitral Valve: The mitral valve is normal in structure. There is mild late systolic prolapse of both leaflets of the mitral valve. Mild mitral valve regurgitation. No evidence of mitral valve stenosis. MV peak gradient, 3.9 mmHg. The mean mitral valve gradient is 1.0 mmHg. Tricuspid Valve: The tricuspid valve is normal in structure. Tricuspid valve regurgitation is mild to moderate. No evidence of tricuspid stenosis. Aortic Valve: The aortic valve is tricuspid. Aortic valve regurgitation is mild to moderate. No aortic stenosis is present. Aortic valve mean gradient measures 1.0 mmHg. Aortic valve peak gradient measures 3.9 mmHg. Aortic valve area, by VTI measures 3.79 cm. Pulmonic Valve: The pulmonic valve  was normal in structure. Pulmonic valve regurgitation is mild to moderate. No evidence of pulmonic stenosis. Aorta: The aortic root is normal in size and structure. There is mild dilatation of the aortic root, measuring 44 mm. There is mild dilatation of the ascending aorta, measuring 43 mm. Venous: The inferior vena cava is dilated in size with less than 50% respiratory variability, suggesting right atrial pressure of 15 mmHg. IAS/Shunts: No atrial level shunt detected by color flow Doppler.  LEFT VENTRICLE PLAX 2D LVIDd:         4.40 cm   Diastology LVIDs:         3.00 cm   LV e' medial:    6.85 cm/s LV PW:         1.30 cm   LV E/e' medial:  6.3 LV IVS:        1.30 cm   LV e' lateral:   5.98 cm/s LVOT diam:     2.10 cm   LV E/e' lateral: 7.2 LV SV:         68 LV SV Index:   31 LVOT Area:     3.46 cm  RIGHT VENTRICLE RV Basal diam:  5.10 cm RV Mid diam:    5.90 cm RV S prime:     14.50 cm/s TAPSE (M-mode): 1.8 cm LEFT ATRIUM             Index        RIGHT ATRIUM           Index LA diam:        2.90 cm 1.31 cm/m   RA Area:     27.50 cm LA Vol (A2C):   59.3 ml 26.84 ml/m  RA Volume:   101.00 ml 45.71 ml/m LA Vol (A4C):   59.1 ml 26.74 ml/m LA Biplane Vol: 59.4 ml 26.88 ml/m  AORTIC VALVE  PULMONIC VALVE AV Area (Vmax):    3.22 cm     PV Vmax:       1.00 m/s AV Area (Vmean):   4.15 cm     PV Peak grad:  4.0 mmHg AV Area (VTI):     3.79 cm AV Vmax:           98.70 cm/s AV Vmean:          45.500 cm/s AV VTI:            0.180 m AV Peak Grad:      3.9 mmHg AV Mean Grad:      1.0 mmHg LVOT Vmax:         91.80 cm/s LVOT Vmean:        54.500 cm/s LVOT VTI:          0.197 m LVOT/AV VTI ratio: 1.09  AORTA Ao Root diam: 4.40 cm Ao Asc diam:  4.30 cm MITRAL VALVE               TRICUSPID VALVE MV Area (PHT): 2.10 cm    TR Peak grad:   47.6 mmHg MV Area VTI:   2.38 cm    TR Vmax:        345.00 cm/s MV Peak grad:  3.9 mmHg MV Mean grad:  1.0 mmHg    SHUNTS MV Vmax:       0.99 m/s    Systemic VTI:  0.20  m MV Vmean:      42.3 cm/s   Systemic Diam: 2.10 cm MV Decel Time: 361 msec MV E velocity: 43.00 cm/s MV A velocity: 84.90 cm/s MV E/A ratio:  0.51 Ida Rogue MD Electronically signed by Ida Rogue MD Signature Date/Time: 01/30/2023/4:10:59 PM    Final    DG Abd Portable 1V  Result Date: 01/30/2023 CLINICAL DATA:  NGT placement EXAM: PORTABLE ABDOMEN - 1 VIEW COMPARISON:  01/29/2023 FINDINGS: Image of the upper abdomen demonstrates a NG tube superimposed with the stomach below the diaphragm. IMPRESSION: NG tube appropriately positioned. Electronically Signed   By: Sammie Bench M.D.   On: 01/30/2023 13:12   @PROBHOSP$ @ No results found.   ASSESSMENT AND PLAN    -Multidisciplinary rounds held today   Circulatory shock -present on admission -Initially thought to be cardiogenic. Possibly volume overload? -follow ABG and LA -follow up cultures -Patient may require central access due to Levophed infusion -No signs of acute blood loss anemia -empiric ABX  Acute Hypoxic Respiratory Failure -Patient with loud rhonchorous breathing and congestion, CXR with pleural effusions and pulmonary edema.  Blood work with cardiac biomarkers elevated including BNP over 2499, and troponin 2351 suggestive of NSTEMI versus demand ischemia -Patient high risk for respiratory arrest, poorly arousable and unable to protect own airway may require endotracheal intubation with mechanical ventilation -continue Bronchodilator Therapy -Failed SBT on 2/17  Acute decompensated systolic and diastolic CHF with EF [id number] Moderate TR and AR -Diuresis as able, currently on levophed and vasopressin due to shock -oxygen as needed ICU telemetry monitoring  Renal Failure-ESRD -appreciate nephrology recs -continue CRRT -follow chem 7 -follow UO -continue Foley Catheter-assess need daily -on regional heparin -increase UF to 100 -noted to have pleural effusions R>L  Severe Coagulopathy  -patient uremic with  thrombocytopenia and on ASA with supratheraputic INR. -HIT panel ordered  Toxic metabolic encephalopathy Due to uremia and ESRD -should improve with CRRT -possible ETOH withdrawal/continue prn ativan  ID -no evidence of infection so far -received vanc  and zosyn 2/14-2/15 -resuming vanc and cefepime given persistent fever  GI/Nutrition GI PROPHYLAXIS as indicated DIET-->TF's as tolerated Constipation protocol as indicated  ENDO -ICU hypoglycemic\Hyperglycemia protocol -check FSBS per protocol  ELECTROLYTES -follow labs as needed -replace as needed -pharmacy consultation  DVT/GI PRX ordered -SCDs  TRANSFUSIONS AS NEEDED MONITOR FSBS ASSESS the need for LABS as needed  Critical care provider statement:   Total critical care time: 70 minutes  Arcelia Jew MD [place]

## 2023-02-01 NOTE — Progress Notes (Signed)
[name]  ROUNDING NOTE   Subjective:   [name] was admitted to [facility] on 01/29/2023 for Peripheral edema [R60.9] Shock circulatory ([place]) [R57.9] Acute on chronic congestive heart failure, unspecified heart failure type ([place]) [I50.9] Acute renal failure superimposed on chronic kidney disease, unspecified acute renal failure type, unspecified CKD stage ([place]) [N17.9, N18.9]  Update: Patient remains on CRRT.  Filter clotted several times.  Tolerating CRRT well thus far. Continues to require pressors.  Objective:  Vital signs in last 24 hours:  Temp:  [97 F (36.1 C)-100.2 F (37.9 C)] 98.1 F (36.7 C) (02/17 1000) Pulse Rate:  [30-112] 71 (02/17 1000) Resp:  [9-26] 16 (02/17 1000) BP: (84-144)/(63-92) 86/65 (02/17 1000) SpO2:  [90 %-100 %] 93 % (02/17 1000) Arterial Line BP: (88-159)/(55-94) 95/57 (02/17 0800) FiO2 (%):  [28 %-40 %] 28 % (02/17 0715) Weight:  [98 kg] 98 kg (02/17 0500)  Weight change: 0.6 kg Filed Weights   01/30/23 0157 01/31/23 0350 02/01/23 0500  Weight: 101.3 kg 97.4 kg 98 kg    Intake/Output: I/O last 3 completed shifts: In: 3048.7 [I.V.:1583.3; NG/GT:1084.7; IV Piggyback:380.8] Out: 4203 [Urine:904; Emesis/NG output:285]   Intake/Output this shift:  Total I/O In: -  Out: 600 [Urine:15]  Physical Exam: General: Critically ill  Head: ETT  Eyes: Anicteric  Neck: Supple  Lungs:  Scattered rales, vent assisted  Heart: S1S2 no rubs  Abdomen:  Soft NTND BS present  Extremities:  + peripheral edema.    Neurologic: Intubated and sedated  Skin: No lesions  Access: Left femoral Temp HD catheter placed 2/14.     Basic Metabolic Panel: Recent Labs  Lab 01/29/23 2046 01/29/23 2358 01/30/23 0455 01/30/23 1612 01/31/23 0358 01/31/23 1114 02/01/23 0500  NA 134*  --  137 134* 136 135 133*  K 5.5*   < > 4.7 4.2 4.2 4.1 3.5  CL 92*  --  96* 99 101 99 101  CO2 27  --  28 25 24 26 25  $ GLUCOSE 149*  --  123* 132* 133* 124*  167*  BUN 129*  --  107* 78* 62* 58* 44*  CREATININE 6.14*  --  4.93* 3.70* 2.96* 3.06* 2.24*  CALCIUM 9.1  --  9.2 9.1 9.4 8.9 8.8*  MG 2.5*  --  2.3  --  1.8  --  2.0  PHOS 8.5*  --  6.1* 5.3* 4.5 4.1 2.8   < > = values in this interval not displayed.     Liver Function Tests: Recent Labs  Lab 01/29/23 0702 01/29/23 2046 01/30/23 0455 01/30/23 1612 01/31/23 0358 01/31/23 1114 02/01/23 0500  AST 36  --   --  23  --   --   --   ALT 13  --   --  12  --   --   --   ALKPHOS 75  --   --  50  --   --   --   BILITOT 2.1*  --   --  4.3*  --   --   --   PROT 7.1  --   --  5.8*  --   --   --   ALBUMIN 3.7   < > 3.3* 3.0*  3.0* 3.0* 2.5* 2.5*   < > = values in this interval not displayed.    No results for input(s): "LIPASE", "AMYLASE" in the last 168 hours. No results for input(s): "AMMONIA" in the last 168 hours.  CBC: Recent Labs  Lab 01/29/23 0702 01/29/23 2227 01/30/23 0455 01/31/23 0358 02/01/23 0500  WBC 8.6 11.4* 11.4* 9.4 7.8  NEUTROABS 6.8  --   --   --   --   HGB 18.3* 16.8 17.5* 16.5 14.5  HCT 59.8* 54.3* 56.0* 51.3 46.2  MCV 106.8* 105.2* 104.7* 103.2* 103.1*  PLT 80* 91* 78* 44* 32*     Cardiac Enzymes: No results for input(s): "CKTOTAL", "CKMB", "CKMBINDEX", "TROPONINI" in the last 168 hours.  BNP: Invalid input(s): "POCBNP"  CBG: Recent Labs  Lab 01/31/23 1541 01/31/23 1918 01/31/23 2339 02/01/23 0357 02/01/23 0804  GLUCAP 135* 141* 75 154* 126*     Microbiology: Results for orders placed or performed during the hospital encounter of 01/29/23  Culture, Respiratory w Gram Stain     Status: None   Collection Time: 01/29/23  3:45 AM   Specimen: Tracheal Aspirate; Respiratory  Result Value Ref Range Status   Specimen Description   Final    TRACHEAL ASPIRATE Performed at [facility], [address] [place], [place] [address]    Special Requests   Final    NONE Performed at [facility], [address]  [place], [place] [address]    Gram Stain   Final    MODERATE WBC PRESENT,BOTH PMN AND MONONUCLEAR NO ORGANISMS SEEN    Culture   Final    RARE Normal respiratory flora-no Staph aureus or Pseudomonas seen Performed at [facility] [address], [place], [place] [address]    Report Status 02/01/2023 FINAL  Final  Culture, blood (Routine X 2) w Reflex to ID Panel     Status: None (Preliminary result)   Collection Time: 01/29/23  1:10 PM   Specimen: BLOOD  Result Value Ref Range Status   Specimen Description BLOOD BLOOD LEFT ARM  Final   Special Requests   Final    BOTTLES DRAWN AEROBIC AND ANAEROBIC Blood Culture adequate volume   Culture   Final    NO GROWTH 3 DAYS Performed at [facility], [address], [place], [place] [address]    Report Status PENDING  Incomplete  MRSA Next Gen by PCR, Nasal     Status: None   Collection Time: 01/29/23  1:47 PM   Specimen: Nasal Mucosa; Nasal Swab  Result Value Ref Range Status   MRSA by PCR Next Gen NOT DETECTED NOT DETECTED Final    Comment: (NOTE) The GeneXpert MRSA Assay (FDA approved for NASAL specimens only), is one component of a comprehensive MRSA colonization surveillance program. It is not intended to diagnose MRSA infection nor to guide or monitor treatment for MRSA infections. Test performance is not FDA approved in patients less than 33 years old. Performed at [facility], [address] [place], [place] [address]   Culture, blood (Routine X 2) w Reflex to ID Panel     Status: None (Preliminary result)   Collection Time: 01/29/23  2:10 PM   Specimen: BLOOD  Result Value Ref Range Status   Specimen Description BLOOD A-LINE  Final   Special Requests   Final    BOTTLES DRAWN AEROBIC AND ANAEROBIC Blood Culture adequate volume   Culture   Final    NO GROWTH 3 DAYS Performed at [facility], [address] [place], [place] [address]    Report Status PENDING  Incomplete    Coagulation  Studies: Recent Labs    01/29/23 1108  LABPROT 16.5*  INR 1.3*  Urinalysis: Recent Labs    01/29/23 2227  COLORURINE YELLOW*  LABSPEC 1.014  PHURINE 5.0  GLUCOSEU NEGATIVE  HGBUR MODERATE*  BILIRUBINUR NEGATIVE  KETONESUR 5*  PROTEINUR NEGATIVE  NITRITE NEGATIVE  LEUKOCYTESUR TRACE*       Imaging: CT Angio Chest Pulmonary Embolism (PE) W or WO Contrast  Result Date: 01/30/2023 CLINICAL DATA:  Chronic hypoxemia and hypercapnia related to COPD, congestive heart failure and chronic renal failure. EXAM: CT ANGIOGRAPHY CHEST WITH CONTRAST TECHNIQUE: Multidetector CT imaging of the chest was performed using the standard protocol during bolus administration of intravenous contrast. Multiplanar CT image reconstructions and MIPs were obtained to evaluate the vascular anatomy. RADIATION DOSE REDUCTION: This exam was performed according to the departmental dose-optimization program which includes automated exposure control, adjustment of the mA and/or kV according to patient size and/or use of iterative reconstruction technique. CONTRAST:  39m OMNIPAQUE IOHEXOL 350 MG/ML SOLN COMPARISON:  Portable chest obtained yesterday. Chest, abdomen and pelvis CT dated 01/18/2022. FINDINGS: Cardiovascular: Enlarged heart. Enlarged ascending thoracic aorta measuring 5.2 cm in diameter at the level of the main pulmonary artery. Enlarged central pulmonary arteries with a main pulmonary artery measuring 3.6 cm in diameter. Maximum aortic arch diameter of 3.2 cm. Descending thoracic aortic diameter of 3.1 cm. Normally opacified pulmonary arteries with no pulmonary arterial filling defects seen. Atheromatous calcifications, including the coronary arteries and aorta. Mediastinum/Nodes: Endotracheal tube in satisfactory position. Enteric tube extending through the esophagus with its tip in the proximal to mid stomach. No enlarged lymph nodes. Unremarkable thyroid gland. Lungs/Pleura: Small to moderate-sized right  pleural effusion and small left pleural effusion. Diffuse bilateral centrilobular and some paraseptal bullous changes, most pronounced in the upper lobes. Mild bilateral lower lobe dependent atelectasis. No airspace consolidation suspicious for pneumonia. No lung nodules. Upper Abdomen: The liver has mildly nodular contours with somewhat prominent lateral segment left lobe and caudate lobe and somewhat small right lobe. Two small probable cysts in the right lobe of the liver. Normal sized spleen. Small to moderate amount of ascites in the upper abdomen. Musculoskeletal: Minimal thoracic spine degenerative changes. Review of the MIP images confirms the above findings. IMPRESSION: 1. No pulmonary emboli. 2. Ascending thoracic aortic aneurysm measuring 5.2 cm in maximum diameter. Recommend semi-annual imaging followup by CTA or MRA and referral to cardiothoracic surgery if not already obtained. This recommendation follows 2010 ACCF/AHA/AATS/ACR/ASA/SCA/SCAI/SIR/STS/SVM Guidelines for the Diagnosis and Management of Patients With Thoracic Aortic Disease. Circulation. 2010; 121:[id number] Aortic aneurysm NOS (ICD10-I71.9) 3. Enlarged central pulmonary arteries compatible with pulmonary arterial hypertension. 4. Small to moderate-sized right pleural effusion and small left pleural effusion. 5. Changes of COPD and centrilobular and paraseptal emphysema. 6. Findings suggesting cirrhosis of the liver with an associated small to moderate amount of ascites in the upper abdomen. 7.  Calcific coronary artery and aortic atherosclerosis. Aortic Atherosclerosis (ICD10-I70.0) and Emphysema (ICD10-J43.9). Electronically Signed   By: SClaudie ReveringM.D.   On: 01/30/2023 18:48   ECHOCARDIOGRAM COMPLETE  Result Date: 01/30/2023    ECHOCARDIOGRAM REPORT   Patient Name:   John [name] Date of Exam: 01/30/2023 Medical Rec #:  [id number]     Height:       71.0 in Accession #:    [id number]    Weight:       223.3 lb Date of Birth:   [birth year]    BSA:          2.210 m Patient Age:    72years  BP:           163/69 mmHg Patient Gender: M              HR:           58 bpm. Exam Location:  [facility] Procedure: 2D Echo, Cardiac Doppler and Color Doppler Indications:     Elevated Troponin  History:         Patient has prior history of Echocardiogram examinations, most                  recent 10/02/2022. CHF, COPD; Risk Factors:Hypertension.                  Cardiogenic shock, ESRD.  Sonographer:     Wenda Low Referring Phys:  [id number] BRITTON L [place] Diagnosing Phys: Ida Rogue MD  Sonographer Comments: Suboptimal apical window and echo performed with patient supine and on artificial respirator. IMPRESSIONS  1. Left ventricular ejection fraction, by estimation, is 50 to 55%. The left ventricle has low normal function. The left ventricle has no regional wall motion abnormalities. There is mild left ventricular hypertrophy. Left ventricular diastolic parameters are consistent with Grade I diastolic dysfunction (impaired relaxation). There is the interventricular septum is flattened in systole and diastole, consistent with right ventricular pressure and volume overload.  2. Right ventricular systolic function is severely reduced. The right ventricular size is moderately enlarged. There is severely elevated pulmonary artery systolic pressure. The estimated right ventricular systolic pressure is [id number] mmHg.  3. Right atrial size was moderately dilated.  4. The mitral valve is normal in structure. Mild mitral valve regurgitation. No evidence of mitral stenosis. There is mild late systolic prolapse of both leaflets of the mitral valve.  5. Tricuspid valve regurgitation is mild to moderate.  6. The aortic valve is tricuspid. Aortic valve regurgitation is mild to moderate. No aortic stenosis is present.  7. The inferior vena cava is dilated in size with <50% respiratory variability, suggesting right atrial pressure of 15 mmHg.  8. There is  mild dilatation of the aortic root, measuring 44 mm. There is mild dilatation of the ascending aorta, measuring 43 mm. FINDINGS  Left Ventricle: Left ventricular ejection fraction, by estimation, is 50 to 55%. The left ventricle has low normal function. The left ventricle has no regional wall motion abnormalities. The left ventricular internal cavity size was normal in size. There is mild left ventricular hypertrophy. The interventricular septum is flattened in systole and diastole, consistent with right ventricular pressure and volume overload. Left ventricular diastolic parameters are consistent with Grade I diastolic dysfunction (impaired relaxation). Right Ventricle: The right ventricular size is moderately enlarged. No increase in right ventricular wall thickness. Right ventricular systolic function is severely reduced. There is severely elevated pulmonary artery systolic pressure. The tricuspid regurgitant velocity is 3.45 m/s, and with an assumed right atrial pressure of 15 mmHg, the estimated right ventricular systolic pressure is [id number] mmHg. Left Atrium: Left atrial size was normal in size. Right Atrium: Right atrial size was moderately dilated. Pericardium: There is no evidence of pericardial effusion. Mitral Valve: The mitral valve is normal in structure. There is mild late systolic prolapse of both leaflets of the mitral valve. Mild mitral valve regurgitation. No evidence of mitral valve stenosis. MV peak gradient, 3.9 mmHg. The mean mitral valve gradient is 1.0 mmHg. Tricuspid Valve: The tricuspid valve is normal in structure. Tricuspid valve regurgitation is mild to moderate. No evidence of tricuspid stenosis. Aortic Valve: The aortic valve  is tricuspid. Aortic valve regurgitation is mild to moderate. No aortic stenosis is present. Aortic valve mean gradient measures 1.0 mmHg. Aortic valve peak gradient measures 3.9 mmHg. Aortic valve area, by VTI measures 3.79 cm. Pulmonic Valve: The pulmonic valve  was normal in structure. Pulmonic valve regurgitation is mild to moderate. No evidence of pulmonic stenosis. Aorta: The aortic root is normal in size and structure. There is mild dilatation of the aortic root, measuring 44 mm. There is mild dilatation of the ascending aorta, measuring 43 mm. Venous: The inferior vena cava is dilated in size with less than 50% respiratory variability, suggesting right atrial pressure of 15 mmHg. IAS/Shunts: No atrial level shunt detected by color flow Doppler.  LEFT VENTRICLE PLAX 2D LVIDd:         4.40 cm   Diastology LVIDs:         3.00 cm   LV e' medial:    6.85 cm/s LV PW:         1.30 cm   LV E/e' medial:  6.3 LV IVS:        1.30 cm   LV e' lateral:   5.98 cm/s LVOT diam:     2.10 cm   LV E/e' lateral: 7.2 LV SV:         68 LV SV Index:   31 LVOT Area:     3.46 cm  RIGHT VENTRICLE RV Basal diam:  5.10 cm RV Mid diam:    5.90 cm RV S prime:     14.50 cm/s TAPSE (M-mode): 1.8 cm LEFT ATRIUM             Index        RIGHT ATRIUM           Index LA diam:        2.90 cm 1.31 cm/m   RA Area:     27.50 cm LA Vol (A2C):   59.3 ml 26.84 ml/m  RA Volume:   101.00 ml 45.71 ml/m LA Vol (A4C):   59.1 ml 26.74 ml/m LA Biplane Vol: 59.4 ml 26.88 ml/m  AORTIC VALVE                    PULMONIC VALVE AV Area (Vmax):    3.22 cm     PV Vmax:       1.00 m/s AV Area (Vmean):   4.15 cm     PV Peak grad:  4.0 mmHg AV Area (VTI):     3.79 cm AV Vmax:           98.70 cm/s AV Vmean:          45.500 cm/s AV VTI:            0.180 m AV Peak Grad:      3.9 mmHg AV Mean Grad:      1.0 mmHg LVOT Vmax:         91.80 cm/s LVOT Vmean:        54.500 cm/s LVOT VTI:          0.197 m LVOT/AV VTI ratio: 1.09  AORTA Ao Root diam: 4.40 cm Ao Asc diam:  4.30 cm MITRAL VALVE               TRICUSPID VALVE MV Area (PHT): 2.10 cm    TR Peak grad:   47.6 mmHg MV Area VTI:   2.38 cm    TR Vmax:  345.00 cm/s MV Peak grad:  3.9 mmHg MV Mean grad:  1.0 mmHg    SHUNTS MV Vmax:       0.99 m/s    Systemic VTI:  0.20  m MV Vmean:      42.3 cm/s   Systemic Diam: 2.10 cm MV Decel Time: 361 msec MV E velocity: 43.00 cm/s MV A velocity: 84.90 cm/s MV E/A ratio:  0.51 Ida Rogue MD Electronically signed by Ida Rogue MD Signature Date/Time: 01/30/2023/4:10:59 PM    Final    DG Abd Portable 1V  Result Date: 01/30/2023 CLINICAL DATA:  NGT placement EXAM: PORTABLE ABDOMEN - 1 VIEW COMPARISON:  01/29/2023 FINDINGS: Image of the upper abdomen demonstrates a NG tube superimposed with the stomach below the diaphragm. IMPRESSION: NG tube appropriately positioned. Electronically Signed   By: Sammie Bench M.D.   On: 01/30/2023 13:12     Medications:    sodium chloride 5 mL/hr at 02/01/23 0700   ceFEPime (MAXIPIME) IV Stopped (02/01/23 0525)   feeding supplement (VITAL AF 1.2 CAL) 60 mL/hr at 01/31/23 1800   fentaNYL infusion INTRAVENOUS 75 mcg/hr (02/01/23 0725)   heparin 10,000 units/ 20 mL infusion syringe 500 Units/hr (02/01/23 1000)   midazolam 1 mg/hr (02/01/23 0726)   norepinephrine (LEVOPHED) Adult infusion 2 mcg/min (02/01/23 0811)   prismasol BGK 2/2.5 dialysis solution 2,000 mL/hr at 01/31/23 2156   prismasol BGK 2/2.5 replacement solution 400 mL/hr at 01/31/23 1200   prismasol BGK 2/2.5 replacement solution 400 mL/hr at 01/31/23 1200   vasopressin 0.04 Units/min (02/01/23 0700)    aspirin  81 mg Per Tube Daily   Chlorhexidine Gluconate Cloth  6 each Topical Daily   docusate  100 mg Per Tube BID   feeding supplement (PROSource TF20)  60 mL Per Tube BID   folic acid  1 mg Per Tube Daily   free water  30 mL Per Tube Q4H   gabapentin  100 mg Per Tube QHS   hydrocortisone sod succinate (SOLU-CORTEF) inj  100 mg Intravenous Q12H   LORazepam  1 mg Per Tube Q6H   multivitamin  1 tablet Per Tube QHS   nutrition supplement (JUVEN)  1 packet Per Tube BID BM   mouth rinse  15 mL Mouth Rinse Q2H   pantoprazole (PROTONIX) IV  40 mg Intravenous Daily   polyethylene glycol  17 g Per Tube Daily   thiamine  (VITAMIN B1) injection  100 mg Intravenous Daily   docusate sodium, fentaNYL, heparin, midazolam, mouth rinse, polyethylene glycol, sodium chloride  Assessment/ Plan:  [name] is a 77 y.o. black male with chronic diastolic congestive heart failure, hypertension, COPD, hyperlipidemia, who is admitted to [facility] on 01/29/2023 for Peripheral edema [R60.9] Shock circulatory ([place]) [R57.9] Acute on chronic congestive heart failure, unspecified heart failure type ([facility]) [I50.9] Acute renal failure superimposed on chronic kidney disease, unspecified acute renal failure type, unspecified CKD stage ([place]) [N17.9, N18.9]  Acute Kidney Injury on chronic kidney disease stage IV: baseline creatinine of 2.67, GFR of 24 on 01/06/23. History of bland urine. Acute kidney injury most likely secondary to cardiorenal syndrome. Chronic kidney disease secondary to hypertension.  Maintain the patient on CRRT at this time with current ultrafiltration target.  Acute exacerbation of chronic diastolic congestive heart failure and acute respiratory failure requiring intubation and mechanical ventilation.  Patient maintained on ventilatory support.  Hypotension with cardiogenic shock requiring vasopressors: Maintain the patient on norepinephrine and vasopressin drip to maintain a MAP of 65 or  greater.   LOS: 3 John [name] 2/17/202411:04 AM

## 2023-02-02 ENCOUNTER — Inpatient Hospital Stay: Payer: Medicare HMO

## 2023-02-02 DIAGNOSIS — R579 Shock, unspecified: Secondary | ICD-10-CM | POA: Diagnosis not present

## 2023-02-02 LAB — RENAL FUNCTION PANEL
Albumin: 2.6 g/dL — ABNORMAL LOW (ref 3.5–5.0)
Albumin: 3.1 g/dL — ABNORMAL LOW (ref 3.5–5.0)
Anion gap: 4 — ABNORMAL LOW (ref 5–15)
Anion gap: 6 (ref 5–15)
BUN: 35 mg/dL — ABNORMAL HIGH (ref 8–23)
BUN: 37 mg/dL — ABNORMAL HIGH (ref 8–23)
CO2: 26 mmol/L (ref 22–32)
CO2: 25 mmol/L (ref 22–32)
Calcium: 8.8 mg/dL — ABNORMAL LOW (ref 8.9–10.3)
Calcium: 9.3 mg/dL (ref 8.9–10.3)
Chloride: 105 mmol/L (ref 98–111)
Chloride: 102 mmol/L (ref 98–111)
Creatinine, Ser: 1.6 mg/dL — ABNORMAL HIGH (ref 0.61–1.24)
Creatinine, Ser: 1.57 mg/dL — ABNORMAL HIGH (ref 0.61–1.24)
GFR, Estimated: 44 mL/min — ABNORMAL LOW (ref >60)
GFR, Estimated: 45 mL/min — ABNORMAL LOW (ref >60)
Glucose, Bld: 187 mg/dL — ABNORMAL HIGH (ref 70–99)
Glucose, Bld: 135 mg/dL — ABNORMAL HIGH (ref 70–99)
Phosphorus: 2.4 mg/dL — ABNORMAL LOW (ref 2.5–4.6)
Phosphorus: 2.5 mg/dL (ref 2.5–4.6)
Potassium: 3.7 mmol/L (ref 3.5–5.1)
Potassium: 3.4 mmol/L — ABNORMAL LOW (ref 3.5–5.1)
Sodium: 135 mmol/L (ref 135–145)
Sodium: 133 mmol/L — ABNORMAL LOW (ref 135–145)

## 2023-02-02 LAB — GLUCOSE, CAPILLARY
Glucose-Capillary: 193 mg/dL — ABNORMAL HIGH (ref 70–99)
Glucose-Capillary: 158 mg/dL — ABNORMAL HIGH (ref 70–99)
Glucose-Capillary: 139 mg/dL — ABNORMAL HIGH (ref 70–99)
Glucose-Capillary: 129 mg/dL — ABNORMAL HIGH (ref 70–99)
Glucose-Capillary: 168 mg/dL — ABNORMAL HIGH (ref 70–99)
Glucose-Capillary: 166 mg/dL — ABNORMAL HIGH (ref 70–99)

## 2023-02-02 LAB — CBC
HCT: 45.6 % (ref 39.0–52.0)
Hemoglobin: 14 g/dL (ref 13.0–17.0)
MCH: 32.2 pg (ref 26.0–34.0)
MCHC: 30.7 g/dL (ref 30.0–36.0)
MCV: 104.8 fL — ABNORMAL HIGH (ref 80.0–100.0)
Platelets: 32 10*3/uL — ABNORMAL LOW (ref 150–400)
RBC: 4.35 MIL/uL (ref 4.22–5.81)
RDW: 15.9 % — ABNORMAL HIGH (ref 11.5–15.5)
WBC: 8.9 10*3/uL (ref 4.0–10.5)
nRBC: 0.2 % (ref 0.0–0.2)

## 2023-02-02 LAB — APTT: aPTT: 38 seconds — ABNORMAL HIGH (ref 24–36)

## 2023-02-02 LAB — MAGNESIUM: Magnesium: 1.9 mg/dL (ref 1.7–2.4)

## 2023-02-02 MED ORDER — FUROSEMIDE 10 MG/ML IJ SOLN
120.0000 mg | Freq: Once | INTRAVENOUS | Status: AC
  Administered 2023-02-02: 120 mg via INTRAVENOUS
  Filled 2023-02-02: qty 10

## 2023-02-02 MED ORDER — POTASSIUM CHLORIDE 20 MEQ PO PACK
20.0000 meq | PACK | ORAL | Status: AC
  Administered 2023-02-02 (×2): 20 meq
  Filled 2023-02-02 (×2): qty 1

## 2023-02-02 MED ORDER — POTASSIUM CHLORIDE 10 MEQ/100ML IV SOLN
10.0000 meq | INTRAVENOUS | Status: DC
  Filled 2023-02-02 (×3): qty 100

## 2023-02-02 MED ORDER — POTASSIUM & SODIUM PHOSPHATES 280-160-250 MG PO PACK
2.0000 | PACK | ORAL | Status: AC
  Administered 2023-02-02 (×2): 2
  Filled 2023-02-02 (×2): qty 2

## 2023-02-02 MED ORDER — MAGNESIUM SULFATE IN D5W 1-5 GM/100ML-% IV SOLN
1.0000 g | Freq: Once | INTRAVENOUS | Status: AC
  Administered 2023-02-02: 1 g via INTRAVENOUS
  Filled 2023-02-02: qty 100

## 2023-02-02 MED ORDER — DEXMEDETOMIDINE HCL IN NACL 400 MCG/100ML IV SOLN
0.0000 ug/kg/h | INTRAVENOUS | Status: DC
  Administered 2023-02-03: 0.4 ug/kg/h via INTRAVENOUS
  Filled 2023-02-02: qty 100

## 2023-02-02 NOTE — Progress Notes (Signed)
PHARMACY CONSULT NOTE   Pharmacy Consult for Electrolyte Monitoring and Replacement   Recent Labs: Potassium (mmol/L)  Date Value  02/02/2023 3.7   Magnesium (mg/dL)  Date Value  02/02/2023 1.9   Calcium (mg/dL)  Date Value  02/02/2023 8.8 (L)   Albumin (g/dL)  Date Value  02/02/2023 2.6 (L)   Phosphorus (mg/dL)  Date Value  02/02/2023 2.4 (L)   Sodium (mmol/L)  Date Value  02/02/2023 135    Assessment: 77 year old male PMH HTN, CKD4, diastolic and systolic heart failure with cor pulmonale, COPD on 2L PTA, history ascending aortic aneurysm, CAD. CRRT with effluent rate 25 mL/kg/hr.  Pressor support with Levophed and vasopressin.  Nutrition: tube feeds  -on CRRT  Goal of Therapy:  K > 4 Mag > 2 All other electrolytes within normal limits  Plan:  2/18 @ 0413:  K = 3.7,   Mag = 1.9 Will order KCl 10 mEq IV X 3 and Mag Sulfate 1 gm IV X 1.  Nephrology following on CRRT Electrolytes @0500$  and @1600$  while on CRRT  John [name] D ,PharmD Clinical Pharmacist 02/02/2023 5:10 AM

## 2023-02-02 NOTE — Progress Notes (Signed)
CRITICAL CARE     Name: Viraaj Biehl MRN: [id number] DOB: 03-08-1946     LOS: 4   SUBJECTIVE FINDINGS & SIGNIFICANT EVENTS    History of presenting illness:    This is 77 year old male with a history of essential hypertension, on hydralazine and Cozaar, stage IV CKD but has not been on dialysis in the past, has chronic diastolic and systolic heart failure with cor pulmonale, moderate tricuspid regurgitation and aortic valve regurgitation, history of a ascending aortic aneurysm, coronary artery disease with atherosclerosis, COPD and chronic hypoxemia baseline 2 L/min supplemental oxygen at home with recurrent episodes of hypoxemia and remote acute exacerbation of COPD with hypoxemia and hypercapnia 2 months ago history of ruptured left quadricep tendon 2 years ago, chronic thrombocytopenia and erythrocytosis recurrent lower extremity cellulitis, recurrent hyperkalemia, morbid obesity history of anasarca dyslipidemia, recent admission for acute CHF exacerbation requiring Lasix drip and BiPAP support, who was brought in by EMS due to worsening altered mental status and circulatory shock.  I was able to meet with wife at bedside during my evaluation who shares patient has been sleeping majority of each day over the last week has been on arousable and received phone call from medical provider regarding worsening kidney function which prompted ER evaluation.  He had chest x-ray performed while in the ER showing bilateral pleural effusions with vascular congestion and atelectasis/infiltrate.  Patient in circulatory shock with Levophed support on admission in ER.  PCCM consultation for admission to medical intensive care unit with circulatory shock consistent with acute on chronic renal failure with CHF exacerbation.  01/30/23- I  met with girlfriend of patient today to review short term medical plan.  He had Echo whith RV dysfunction and concern for PE.  We have started CRRT yesterday with additional interval improvement. He will have CTPE today.  He is weaned down on pressors today on vasor 0.4 and 4 of levophed reduced from 36 this morning.  01/31/23- remains agitated intermittently overnight. Continued issues with clot burden through CRRT circuit.  02/01/23- remains agitated overnight. Requiring numerous ativan pushes. Attempting to wean down sedation. Failed SBT miserably.  02/02/23- issues with CRRT overnight. Will attempt higher UF today. Assess for possible chest tube placement.   Lines/tubes :   Microbiology/Sepsis markers: Results for orders placed or performed during the hospital encounter of 01/29/23  Culture, Respiratory w Gram Stain     Status: None   Collection Time: 01/29/23  3:45 AM   Specimen: Tracheal Aspirate; Respiratory  Result Value Ref Range Status   Specimen Description   Final    TRACHEAL ASPIRATE Performed at [facility], [address], [place], [place] [address]    Special Requests   Final    NONE Performed at [facility], [address] [place], [place] [address]    Gram Stain   Final    MODERATE WBC PRESENT,BOTH PMN AND MONONUCLEAR NO ORGANISMS SEEN    Culture   Final    RARE Normal respiratory flora-no Staph aureus or Pseudomonas seen Performed at [facility] [address], [place], [place] [address]    Report Status 02/01/2023 FINAL  Final  Culture, blood (Routine X 2) w Reflex to ID Panel     Status: None (Preliminary result)   Collection Time: 01/29/23  1:10 PM   Specimen: BLOOD  Result Value Ref Range Status   Specimen Description BLOOD BLOOD LEFT ARM  Final   Special Requests   Final  BOTTLES DRAWN AEROBIC AND ANAEROBIC Blood Culture adequate volume   Culture   Final    NO GROWTH 4 DAYS Performed at [facility], [address] [place], [place] [address]    Report Status PENDING  Incomplete  MRSA Next Gen by PCR, Nasal     Status: None   Collection Time: 01/29/23  1:47 PM   Specimen: Nasal Mucosa; Nasal Swab  Result Value Ref Range Status   MRSA by PCR Next Gen NOT DETECTED NOT DETECTED Final    Comment: (NOTE) The GeneXpert MRSA Assay (FDA approved for NASAL specimens only), is one component of a comprehensive MRSA colonization surveillance program. It is not intended to diagnose MRSA infection nor to guide or monitor treatment for MRSA infections. Test performance is not FDA approved in patients less than 92 years old. Performed at [facility], [address] [place], [place] [address]   Culture, blood (Routine X 2) w Reflex to ID Panel     Status: None (Preliminary result)   Collection Time: 01/29/23  2:10 PM   Specimen: BLOOD  Result Value Ref Range Status   Specimen Description BLOOD A-LINE  Final   Special Requests   Final    BOTTLES DRAWN AEROBIC AND ANAEROBIC Blood Culture adequate volume   Culture   Final    NO GROWTH 4 DAYS Performed at [facility], [address] [place], [place] [address]    Report Status PENDING  Incomplete    Anti-infectives:  Anti-infectives (From admission, onward)    Start     Dose/Rate Route Frequency Ordered Stop   01/31/23 1700  ceFEPIme (MAXIPIME) 2 g in sodium chloride 0.9 % 100 mL IVPB        2 g 200 mL/hr over 30 Minutes Intravenous Every 12 hours 01/31/23 1555     01/30/23 1700  vancomycin (VANCOREADY) IVPB 1250 mg/250 mL  Status:  Discontinued        1,250 mg 166.7 mL/hr over 90 Minutes Intravenous Every 24 hours 01/29/23 1543 01/30/23 1036   01/30/23 1300  Ampicillin-Sulbactam (UNASYN) 3 g in sodium chloride 0.9 % 100 mL IVPB  Status:  Discontinued        3 g 200 mL/hr over 30 Minutes Intravenous Every 8 hours 01/30/23 1036 01/31/23 1118   01/30/23 0000  vancomycin variable dose per unstable renal function (pharmacist  dosing)  Status:  Discontinued         Does not apply See admin instructions 01/29/23 1125 01/29/23 1548   01/29/23 2200  piperacillin-tazobactam (ZOSYN) IVPB 3.375 g  Status:  Discontinued        3.375 g 12.5 mL/hr over 240 Minutes Intravenous Every 6 hours 01/29/23 1547 01/29/23 1548   01/29/23 2200  piperacillin-tazobactam (ZOSYN) IVPB 3.375 g  Status:  Discontinued        3.375 g 100 mL/hr over 30 Minutes Intravenous Every 6 hours 01/29/23 1548 01/30/23 1036   01/29/23 2100  piperacillin-tazobactam (ZOSYN) IVPB 2.25 g  Status:  Discontinued        2.25 g 100 mL/hr over 30 Minutes Intravenous Every 8 hours 01/29/23 1125 01/29/23 1547   01/29/23 1115  piperacillin-tazobactam (ZOSYN) IVPB 3.375 g        3.375 g 100 mL/hr over 30 Minutes Intravenous  Once 01/29/23 1103 01/29/23 1457   01/29/23 1115  vancomycin (VANCOREADY) IVPB 2000 mg/400 mL        2,000 mg 200 mL/hr over 120 Minutes Intravenous  Once 01/29/23  1104 01/29/23 1656        PAST MEDICAL HISTORY   Past Medical History:  Diagnosis Date   CHF (congestive heart failure) (HCC)    EF 45% in 2021   Chronic kidney disease    COPD (chronic obstructive pulmonary disease) ([place]) 02/16/2020   Hyperlipidemia    Hypertension    Pneumonia 2021     SURGICAL HISTORY   Past Surgical History:  Procedure Laterality Date   CATARACT EXTRACTION W/PHACO Left 10/31/2021   Procedure: CATARACT EXTRACTION PHACO AND INTRAOCULAR LENS PLACEMENT ([place]) LEFT VISION BLUE 3.45 00:48.0;  Surgeon: Leandrew Koyanagi, MD;  Location: [facility];  Service: Ophthalmology;  Laterality: Left;   CATARACT EXTRACTION W/PHACO Right 11/14/2021   Procedure: CATARACT EXTRACTION PHACO AND INTRAOCULAR LENS PLACEMENT ([facility]) RIGHT 8.59 01:10.5;  Surgeon: Leandrew Koyanagi, MD;  Location: [facility];  Service: Ophthalmology;  Laterality: Right;   INGUINAL HERNIA REPAIR Right 02/04/2017   Procedure: HERNIA REPAIR INGUINAL ADULT;  Surgeon: Leonie Green, MD;  Location: [facility];  Service: General;  Laterality: Right;   NO PAST SURGERIES     QUADRICEPS TENDON REPAIR Left 02/15/2020   Procedure: REPAIR QUADRICEP TENDON;  Surgeon: Corky Mull, MD;  Location: [facility];  Service: Orthopedics;  Laterality: Left;     FAMILY HISTORY   Family History  Problem Relation Age of Onset   Diabetes Mother    Diabetes Brother    Diabetes Maternal Grandmother    Diabetes Paternal Grandmother      SOCIAL HISTORY   Social History   Tobacco Use   Smoking status: Former    Packs/day: 0.25    Years: 53.00    Total pack years: 13.25    Types: Cigarettes    Quit date: 02/15/2015    Years since quitting: 7.9   Smokeless tobacco: Never  Substance Use Topics   Alcohol use: Yes    Comment: occassional   Drug use: No     MEDICATIONS   Current Medication:  Current Facility-Administered Medications:    0.9 %  sodium chloride infusion, 250 mL, Intravenous, Continuous, Aleskerov, Fuad, MD, Stopped at 02/02/23 0547   aspirin chewable tablet 81 mg, 81 mg, Per Tube, Daily, Wynelle Cleveland, RPH, 81 mg at 02/01/23 0958   ceFEPIme (MAXIPIME) 2 g in sodium chloride 0.9 % 100 mL IVPB, 2 g, Intravenous, Q12H, Wynelle Cleveland, RPH, Last Rate: 200 mL/hr at 02/02/23 0600, Infusion Verify at 02/02/23 0600   Chlorhexidine Gluconate Cloth 2 % PADS 6 each, 6 each, Topical, Daily, Rust-Chester, Britton L, NP, 6 each at 02/01/23 0957   docusate (COLACE) 50 MG/5ML liquid 100 mg, 100 mg, Per Tube, BID, Rust-Chester, Toribio Harbour L, NP, 100 mg at 02/01/23 2155   docusate sodium (COLACE) capsule 100 mg, 100 mg, Oral, BID PRN, Ottie Glazier, MD   feeding supplement (PROSource TF20) liquid 60 mL, 60 mL, Per Tube, BID, Aleskerov, Fuad, MD, 60 mL at 02/01/23 2155   feeding supplement (VITAL AF 1.2 CAL) liquid 1,000 mL, 1,000 mL, Per Tube, Continuous, Jawanna Dykman, Earley Abide, MD, Last Rate: 60 mL/hr at 02/02/23 0613, 1,000 mL at 02/02/23 0613   fentaNYL (SUBLIMAZE) bolus  via infusion 50 mcg, 50 mcg, Intravenous, Q1H PRN, Rust-Chester, Britton L, NP, 50 mcg at 01/31/23 2123   fentaNYL 2548mg in NS 2562m(1068mml) infusion-PREMIX, 0-200 mcg/hr, Intravenous, Continuous, Rust-Chester, Britton L, NP, Last Rate: 15 mL/hr at 02/02/23 0600, 150 mcg/hr at [id number] acid (FOLVITE) tablet 1  mg, 1 mg, Per Tube, Daily, Rust-Chester, Britton L, NP, 1 mg at 02/01/23 0919   free water 30 mL, 30 mL, Per Tube, Q4H, Aleskerov, Fuad, MD, 30 mL at 02/02/23 0546   gabapentin (NEURONTIN) 250 MG/5ML solution 100 mg, 100 mg, Per Tube, QHS, Wynelle Cleveland, RPH, 100 mg at 02/01/23 2155   heparin 10,000 units/ 20 mL infusion syringe, 500-1,000 Units/hr, CRRT, Continuous, Rust-Chester, Britton L, NP, Last Rate: 1 mL/hr at 02/02/23 0900, 500 Units/hr at 02/02/23 0900   heparin injection 1,000-6,000 Units, 1,000-6,000 Units, CRRT, PRN, Kolluru, Sarath, MD, 4,000 Units at 02/02/23 0640   hydrocortisone sodium succinate (SOLU-CORTEF) 100 MG injection 100 mg, 100 mg, Intravenous, Q12H, Darel Hong D, NP, 100 mg at 02/01/23 2359   LORazepam (ATIVAN) tablet 1 mg, 1 mg, Per Tube, Q6H, Rust-Chester, Britton L, NP, 1 mg at 02/02/23 0550   midazolam (VERSED) 100 mg/100 mL (1 mg/mL) premix infusion, 0-10 mg/hr, Intravenous, Continuous, Darel Hong D, NP, Last Rate: 2 mL/hr at 02/02/23 0600, 2 mg/hr at 02/02/23 0600   midazolam (VERSED) bolus via infusion 0-5 mg, 0-5 mg, Intravenous, Q1H PRN, Darel Hong D, NP   multivitamin (RENA-VIT) tablet 1 tablet, 1 tablet, Per Tube, QHS, Ottie Glazier, MD, 1 tablet at 02/01/23 2155   norepinephrine (LEVOPHED) 16 mg in 241m (0.064 mg/mL) premix infusion, 0-40 mcg/min, Intravenous, Continuous, Rust-Chester, Britton L, NP, Last Rate: 2.81 mL/hr at 02/02/23 0600, 3 mcg/min at 02/02/23 0600   nutrition supplement (JUVEN) (JUVEN) powder packet 1 packet, 1 packet, Per Tube, BID BM, AOttie Glazier MD, 1 packet at 02/01/23 1448   Oral care mouth  rinse, 15 mL, Mouth Rinse, Q2H, Aleskerov, Fuad, MD, 15 mL at 02/02/23 0750   Oral care mouth rinse, 15 mL, Mouth Rinse, PRN, ALanney Gins Fuad, MD   pantoprazole (PROTONIX) injection 40 mg, 40 mg, Intravenous, Daily, Rust-Chester, Britton L, NP, 40 mg at 02/01/23 0919   polyethylene glycol (MIRALAX / GLYCOLAX) packet 17 g, 17 g, Oral, Daily PRN, ALanney Gins Fuad, MD   polyethylene glycol (MIRALAX / GLYCOLAX) packet 17 g, 17 g, Per Tube, Daily, Rust-Chester, Britton L, NP, 17 g at 02/01/23 0919   prismasol BGK 2/2.5 dialysis solution, , CRRT, Continuous, Kolluru, Sarath, MD, Last Rate: 2,000 mL/hr at 02/02/23 0613, New Bag at 02/02/23 0613   prismasol BGK 2/2.5 replacement solution, , CRRT, Continuous, Kolluru, Sarath, MD, Last Rate: 400 mL/hr at 02/02/23 0612, New Bag at 02/02/23 0612   prismasol BGK 2/2.5 replacement solution, , CRRT, Continuous, Kolluru, Sarath, MD, Last Rate: 400 mL/hr at 02/02/23 0612, New Bag at 02/02/23 0612   sodium chloride 0.9 % primer fluid for CRRT, , CRRT, PRN, Kolluru, Sarath, MD   thiamine (VITAMIN B1) injection 100 mg, 100 mg, Intravenous, Daily, Rust-Chester, BToribio HarbourL, NP, 100 mg at 02/01/23 0919   vasopressin (PITRESSIN) 20 Units in sodium chloride 0.9 % 100 mL infusion-*FOR SHOCK*, 0-0.04 Units/min, Intravenous, Continuous, Rust-Chester, Britton L, NP, Last Rate: 12 mL/hr at 02/02/23 0600, 0.04 Units/min at 02/02/23 0600    ALLERGIES   Patient has no known allergies.    REVIEW OF SYSTEMS    Unable to obtain ROS due to unresponsive mental status.  PHYSICAL EXAMINATION   Vital Signs: Temp:  [97 F (36.1 C)-99.3 F (37.4 C)] 97 F (36.1 C) (02/18 0709) Pulse Rate:  [49-100] 54 (02/18 0709) Resp:  [14-24] 16 (02/18 0709) BP: (78-133)/(56-92) 128/87 (02/18 0700) SpO2:  [89 %-100 %] 98 % (02/18 0709) Arterial Line BP: (81-148)/(48-80) 140/77 (  02/18 0709) FiO2 (%):  [40 %] 40 % (02/18 0709) Weight:  [95.9 kg] 95.9 kg (02/18 0410)  GENERAL:  Age-appropriate, unable to arouse patient with sternal rub no apparent distress HEAD: Normocephalic, atraumatic.  EYES: Pupils equal, round, reactive to light.  No scleral icterus.  MOUTH: Moist mucosal membrane. NECK: Supple. No thyromegaly. No nodules. No JVD.  PULMONARY: Rhonchorous breathing with crepitations bilaterally, decreased BS in R base CARDIOVASCULAR: S1 and S2. Regular rate and rhythm. No murmurs, rubs, or gallops.  GASTROINTESTINAL: Soft, nontender, non-distended. No masses. Positive bowel sounds. No hepatosplenomegaly.  MUSCULOSKELETAL: Chronic stasis dermatitis with 2+ edema bilaterally NEUROLOGIC: GCS 6, poorly arousable SKIN:intact,warm,dry   PERTINENT DATA     Infusions:  sodium chloride Stopped (02/02/23 0547)   ceFEPime (MAXIPIME) IV 200 mL/hr at 02/02/23 0600   feeding supplement (VITAL AF 1.2 CAL) 1,000 mL (02/02/23 [id number])   fentaNYL infusion INTRAVENOUS 150 mcg/hr (02/02/23 0600)   heparin 10,000 units/ 20 mL infusion syringe 500 Units/hr (02/02/23 0900)   midazolam 2 mg/hr (02/02/23 0600)   norepinephrine (LEVOPHED) Adult infusion 3 mcg/min (02/02/23 0600)   prismasol BGK 2/2.5 dialysis solution 2,000 mL/hr at 02/02/23 0613   prismasol BGK 2/2.5 replacement solution 400 mL/hr at 02/02/23 0612   prismasol BGK 2/2.5 replacement solution 400 mL/hr at 02/02/23 0612   vasopressin 0.04 Units/min (02/02/23 0600)   Scheduled Medications:  aspirin  81 mg Per Tube Daily   Chlorhexidine Gluconate Cloth  6 each Topical Daily   docusate  100 mg Per Tube BID   feeding supplement (PROSource TF20)  60 mL Per Tube BID   folic acid  1 mg Per Tube Daily   free water  30 mL Per Tube Q4H   gabapentin  100 mg Per Tube QHS   hydrocortisone sod succinate (SOLU-CORTEF) inj  100 mg Intravenous Q12H   LORazepam  1 mg Per Tube Q6H   multivitamin  1 tablet Per Tube QHS   nutrition supplement (JUVEN)  1 packet Per Tube BID BM   mouth rinse  15 mL Mouth Rinse Q2H   pantoprazole  (PROTONIX) IV  40 mg Intravenous Daily   polyethylene glycol  17 g Per Tube Daily   thiamine (VITAMIN B1) injection  100 mg Intravenous Daily   PRN Medications: docusate sodium, fentaNYL, heparin, midazolam, mouth rinse, polyethylene glycol, sodium chloride Hemodynamic parameters:   Intake/Output: 02/17 0701 - 02/18 0700 In: [id number] [I.V.:785.4; NG/GT:30; IV Piggyback:138.6] Out: [id number] [Urine:115]  Ventilator  Settings: Vent Mode: PRVC FiO2 (%):  [40 %] 40 % Set Rate:  [16 bmp] 16 bmp Vt Set:  [500 mL] 500 mL PEEP:  [5 cmH20] 5 cmH20 Plateau Pressure:  [6 cmH20-15 cmH20] 15 cmH20  LAB RESULTS:  Basic Metabolic Panel: Recent Labs  Lab 01/29/23 2046 01/29/23 2358 01/30/23 0455 01/30/23 1612 01/31/23 0358 01/31/23 1114 02/01/23 0500 02/01/23 1702 02/02/23 0413  NA 134*  --  137   < > 136 135 133* 134* 135  K 5.5*   < > 4.7   < > 4.2 4.1 3.5 3.6 3.7  CL 92*  --  96*   < > 101 99 101 103 105  CO2 27  --  28   < > 24 26 25 25 26  $ GLUCOSE 149*  --  123*   < > 133* 124* 167* 224* 187*  BUN 129*  --  107*   < > 62* 58* 44* 35* 35*  CREATININE 6.14*  --  4.93*   < >  2.96* 3.06* 2.24* 1.78* 1.60*  CALCIUM 9.1  --  9.2   < > 9.4 8.9 8.8* 9.2 8.8*  MG 2.5*  --  2.3  --  1.8  --  2.0  --  1.9  PHOS 8.5*  --  6.1*   < > 4.5 4.1 2.8 2.9 2.4*   < > = values in this interval not displayed.   Liver Function Tests: Recent Labs  Lab 01/29/23 0702 01/29/23 2046 01/30/23 1612 01/31/23 0358 01/31/23 1114 02/01/23 0500 02/01/23 1702 02/02/23 0413  AST 36  --  23  --   --   --   --   --   ALT 13  --  12  --   --   --   --   --   ALKPHOS 75  --  50  --   --   --   --   --   BILITOT 2.1*  --  4.3*  --   --   --   --   --   PROT 7.1  --  5.8*  --   --   --   --   --   ALBUMIN 3.7   < > 3.0*  3.0* 3.0* 2.5* 2.5* 2.7* 2.6*   < > = values in this interval not displayed.   No results for input(s): "LIPASE", "AMYLASE" in the last 168 hours. No results for input(s): "AMMONIA" in the last  168 hours. CBC: Recent Labs  Lab 01/29/23 0702 01/29/23 2227 01/30/23 0455 01/31/23 0358 02/01/23 0500 02/02/23 0413  WBC 8.6 11.4* 11.4* 9.4 7.8 8.9  NEUTROABS 6.8  --   --   --   --   --   HGB 18.3* 16.8 17.5* 16.5 14.5 14.0  HCT 59.8* 54.3* 56.0* 51.3 46.2 45.6  MCV 106.8* 105.2* 104.7* 103.2* 103.1* 104.8*  PLT 80* 91* 78* 44* 32* 32*   Cardiac Enzymes: No results for input(s): "CKTOTAL", "CKMB", "CKMBINDEX", "TROPONINI" in the last 168 hours. BNP: Invalid input(s): "POCBNP" CBG: Recent Labs  Lab 02/01/23 1212 02/01/23 1838 02/01/23 1936 02/01/23 2334 02/02/23 0343  GLUCAP 147* 178* 187* 168* 193*   IMAGING RESULTS:  Imaging: DG Chest Port 1 View  Result Date: 02/01/2023 CLINICAL DATA:  Dyspnea EXAM: PORTABLE CHEST 1 VIEW COMPARISON:  01/29/2023 FINDINGS: There is pulmonary vascular congestion. Bibasilar consolidation or volume loss. No pneumothorax. Possible small pleural effusions. Endotracheal tube tip 3 cm above carina. NG tube tip below the diaphragm and off x-ray. Mediastinal drain or probe overlies the mid thorax. IMPRESSION: Vascular congestion.  Bibasilar consolidation or volume loss. Electronically Signed   By: Sammie Bench M.D.   On: 02/01/2023 13:56   @PROBHOSP$ @ DG Chest Port 1 View  Result Date: 02/01/2023 CLINICAL DATA:  Dyspnea EXAM: PORTABLE CHEST 1 VIEW COMPARISON:  01/29/2023 FINDINGS: There is pulmonary vascular congestion. Bibasilar consolidation or volume loss. No pneumothorax. Possible small pleural effusions. Endotracheal tube tip 3 cm above carina. NG tube tip below the diaphragm and off x-ray. Mediastinal drain or probe overlies the mid thorax. IMPRESSION: Vascular congestion.  Bibasilar consolidation or volume loss. Electronically Signed   By: Sammie Bench M.D.   On: 02/01/2023 13:56     ASSESSMENT AND PLAN    -Multidisciplinary rounds held today   Circulatory shock -present on admission -Initially thought to be cardiogenic.  Possibly volume overload? -follow ABG and LA -follow up cultures -Patient may require central access due to Levophed infusion -No signs of acute blood  loss anemia -empiric ABX  Acute Hypoxic Respiratory Failure -Patient with loud rhonchorous breathing and congestion, CXR with pleural effusions and pulmonary edema.  Blood work with cardiac biomarkers elevated including BNP over 2499, and troponin 2351 suggestive of NSTEMI versus demand ischemia -Patient high risk for respiratory arrest, poorly arousable and unable to protect own airway may require endotracheal intubation with mechanical ventilation -continue Bronchodilator Therapy -Failed SBT on 2/17 -Consider R chest tube placement if continued inability to wean off ventilator, but it seems like mental status and volume overload are likely impeding this  Acute decompensated systolic and diastolic CHF with EF [id number] Moderate TR and AR -Diuresis as able, currently on levophed due to shock -oxygen as needed -ICU telemetry monitoring  Renal Failure-ESRD -appreciate nephrology recs -continue CRRT -follow chem 7 -follow UO -continue Foley Catheter-assess need daily -on regional heparin -increase UF to 200 -noted to have pleural effusions R>L  Severe Coagulopathy  -patient uremic with thrombocytopenia and on ASA with supratheraputic INR. -HIT panel ordered  Toxic metabolic encephalopathy Due to uremia and ESRD -should improve with CRRT -possible ETOH withdrawal/continue prn ativan  ID -no evidence of infection so far -received vanc and zosyn 2/14-2/15 -resuming vanc and cefepime given persistent fever  GI/Nutrition GI PROPHYLAXIS as indicated DIET-->TF's as tolerated Constipation protocol as indicated  ENDO -ICU hypoglycemic\Hyperglycemia protocol -check FSBS per protocol  ELECTROLYTES -follow labs as needed -replace as needed -pharmacy consultation  DVT/GI PRX ordered -SCDs  TRANSFUSIONS AS NEEDED MONITOR  FSBS ASSESS the need for LABS as needed  Critical care provider statement:   Total critical care time: 70 minutes  Arcelia Jew MD [place]

## 2023-02-02 NOTE — Progress Notes (Signed)
On just levophed.   Attempting lasix challenge as Cr has improved.   Ongoing SAT.   Arcelia Jew MD [facility]

## 2023-02-02 NOTE — Progress Notes (Signed)
PHARMACY CONSULT NOTE   Pharmacy Consult for Electrolyte Monitoring and Replacement   Recent Labs: Potassium (mmol/L)  Date Value  02/02/2023 3.4 (L)   Magnesium (mg/dL)  Date Value  02/02/2023 1.9   Calcium (mg/dL)  Date Value  02/02/2023 9.3   Albumin (g/dL)  Date Value  02/02/2023 3.1 (L)   Phosphorus (mg/dL)  Date Value  02/02/2023 2.5   Sodium (mmol/L)  Date Value  02/02/2023 133 (L)    Assessment: 77 year old male PMH HTN, CKD4, diastolic and systolic heart failure with cor pulmonale, COPD on 2L PTA, history ascending aortic aneurysm, CAD. CRRT with effluent rate 25 mL/kg/hr.  Pressor support with Levophed and vasopressin.  Nutrition: tube feeds  -on CRRT  Goal of Therapy:  K > 4 Mag > 2 All other electrolytes within normal limits  Plan:  2/18 @ 1605:  K = 3.4,  Phos =2.5, Ca 9.3 ,no Mg at this time Will Order Kcl 20 meq per tube q 2 hrs x 2 doses Nephrology following on CRRT Electrolytes @0500$  and @1600$  while on CRRT  Gaylyn Berish Rodriguez-Guzman PharmD, BCPS 02/02/2023 5:37 PM

## 2023-02-02 NOTE — Progress Notes (Signed)
Patient transported to CT and back to ICU room, on transport vent, with no incidents.

## 2023-02-02 NOTE — Progress Notes (Signed)
Pharmacy Antibiotic Note  John [name] is a 77 y.o. male admitted on 01/29/2023 with sepsis, respiatory failure with pleural effusions, acute decompensated heart failure and renal failure on CRRT. New fevers on 2/16 PM. Pharmacy has been consulted for cefepime dosing.   On CRRT with Effluent rate 25 mL/kg/hr.  Plan: Cefepime 2 grams every 12 hours for CRRT   Follow CRRT / renal function for dose adjustments and culture finalization.  Height: 5' 11"$  (180.3 cm) Weight: 95.9 kg (211 lb 6.7 oz) IBW/kg (Calculated) : 75.3  Temp (24hrs), Avg:98.1 F (36.7 C), Min:96.6 F (35.9 C), Max:99.3 F (37.4 C)  Recent Labs  Lab 01/29/23 2227 01/30/23 0455 01/30/23 1612 01/31/23 0358 01/31/23 1114 02/01/23 0500 02/01/23 1702 02/02/23 0413  WBC 11.4* 11.4*  --  9.4  --  7.8  --  8.9  CREATININE  --  4.93*   < > 2.96* 3.06* 2.24* 1.78* 1.60*   < > = values in this interval not displayed.     Estimated Creatinine Clearance: 45.7 mL/min (A) (by C-G formula based on SCr of 1.6 mg/dL (H)).    No Known Allergies  Antimicrobials this admission: vancomycin 2/14 >> 2/15 Zosyn 2/14 >> 2/15 Unasyn 2/15 >> 2/16 Cefepime 2/16 >>  Dose adjustments this admission: N/a  Microbiology results: 2/14 BCx: NG x 4d 2/14 MRSA PCR: Not detected 2/16 Tracheal aspirate: NL resp flora  Thank you for allowing pharmacy to be a part of this patient's care.  Clessie Karras A 02/02/2023 10:55 AM

## 2023-02-02 NOTE — Progress Notes (Signed)
[name]  ROUNDING NOTE   Subjective:   [name] was admitted to [facility] on 01/29/2023 for Peripheral edema [R60.9] Shock circulatory ([place]) [R57.9] Acute on chronic congestive heart failure, unspecified heart failure type ([place]) [I50.9] Acute renal failure superimposed on chronic kidney disease, unspecified acute renal failure type, unspecified CKD stage ([place]) [N17.9, N18.9]  Update: Patient still on CRRT.  UF performed yesterday was 4.3 L. Volume status does appear to have improved. Patient still with acute respiratory failure. Making very little urine at this time.  Objective:  Vital signs in last 24 hours:  Temp:  [96.6 F (35.9 C)-99.3 F (37.4 C)] 96.6 F (35.9 C) (02/18 1000) Pulse Rate:  [49-100] 54 (02/18 1000) Resp:  [14-24] 16 (02/18 1000) BP: (78-133)/(56-92) 102/73 (02/18 1000) SpO2:  [91 %-100 %] 97 % (02/18 1000) Arterial Line BP: (81-148)/(48-80) 116/62 (02/18 1000) FiO2 (%):  [40 %] 40 % (02/18 0709) Weight:  [95.9 kg] 95.9 kg (02/18 0410)  Weight change: -2.1 kg Filed Weights   01/31/23 0350 02/01/23 0500 02/02/23 0410  Weight: 97.4 kg 98 kg 95.9 kg    Intake/Output: I/O last 3 completed shifts: In: 2017.4 [I.V.:1278.7; NG/GT:500; IV Piggyback:238.6] Out: [id number] [Urine:402]   Intake/Output this shift:  Total I/O In: -  Out: 900   Physical Exam: General: Critically ill  Head: ETT  Eyes: Anicteric  Neck: Supple  Lungs:  Scattered rales, vent assisted  Heart: S1S2 no rubs  Abdomen:  Soft NTND BS present  Extremities: Trace peripheral edema.    Neurologic: Intubated and sedated  Skin: No lesions  Access: Left femoral Temp HD catheter placed 2/14.     Basic Metabolic Panel: Recent Labs  Lab 01/29/23 2046 01/29/23 2358 01/30/23 0455 01/30/23 1612 01/31/23 0358 01/31/23 1114 02/01/23 0500 02/01/23 1702 02/02/23 0413  NA 134*  --  137   < > 136 135 133* 134* 135  K 5.5*   < > 4.7   < > 4.2 4.1 3.5 3.6 3.7  CL 92*   --  96*   < > 101 99 101 103 105  CO2 27  --  28   < > 24 26 25 25 26  $ GLUCOSE 149*  --  123*   < > 133* 124* 167* 224* 187*  BUN 129*  --  107*   < > 62* 58* 44* 35* 35*  CREATININE 6.14*  --  4.93*   < > 2.96* 3.06* 2.24* 1.78* 1.60*  CALCIUM 9.1  --  9.2   < > 9.4 8.9 8.8* 9.2 8.8*  MG 2.5*  --  2.3  --  1.8  --  2.0  --  1.9  PHOS 8.5*  --  6.1*   < > 4.5 4.1 2.8 2.9 2.4*   < > = values in this interval not displayed.     Liver Function Tests: Recent Labs  Lab 01/29/23 0702 01/29/23 2046 01/30/23 1612 01/31/23 0358 01/31/23 1114 02/01/23 0500 02/01/23 1702 02/02/23 0413  AST 36  --  23  --   --   --   --   --   ALT 13  --  12  --   --   --   --   --   ALKPHOS 75  --  50  --   --   --   --   --   BILITOT 2.1*  --  4.3*  --   --   --   --   --  PROT 7.1  --  5.8*  --   --   --   --   --   ALBUMIN 3.7   < > 3.0*  3.0* 3.0* 2.5* 2.5* 2.7* 2.6*   < > = values in this interval not displayed.    No results for input(s): "LIPASE", "AMYLASE" in the last 168 hours. No results for input(s): "AMMONIA" in the last 168 hours.  CBC: Recent Labs  Lab 01/29/23 0702 01/29/23 2227 01/30/23 0455 01/31/23 0358 02/01/23 0500 02/02/23 0413  WBC 8.6 11.4* 11.4* 9.4 7.8 8.9  NEUTROABS 6.8  --   --   --   --   --   HGB 18.3* 16.8 17.5* 16.5 14.5 14.0  HCT 59.8* 54.3* 56.0* 51.3 46.2 45.6  MCV 106.8* 105.2* 104.7* 103.2* 103.1* 104.8*  PLT 80* 91* 78* 44* 32* 32*     Cardiac Enzymes: No results for input(s): "CKTOTAL", "CKMB", "CKMBINDEX", "TROPONINI" in the last 168 hours.  BNP: Invalid input(s): "POCBNP"  CBG: Recent Labs  Lab 02/01/23 1212 02/01/23 1838 02/01/23 1936 02/01/23 2334 02/02/23 0343  GLUCAP 147* 178* 187* 168* 52*     Microbiology: Results for orders placed or performed during the hospital encounter of 01/29/23  Culture, Respiratory w Gram Stain     Status: None   Collection Time: 01/29/23  3:45 AM   Specimen: Tracheal Aspirate; Respiratory   Result Value Ref Range Status   Specimen Description   Final    TRACHEAL ASPIRATE Performed at [facility], [address], [place], [place] [address]    Special Requests   Final    NONE Performed at [facility], [address] [place], [place] [address]    Gram Stain   Final    MODERATE WBC PRESENT,BOTH PMN AND MONONUCLEAR NO ORGANISMS SEEN    Culture   Final    RARE Normal respiratory flora-no Staph aureus or Pseudomonas seen Performed at [facility] [address] [place], [place] [address]    Report Status 02/01/2023 FINAL  Final  Culture, blood (Routine X 2) w Reflex to ID Panel     Status: None (Preliminary result)   Collection Time: 01/29/23  1:10 PM   Specimen: BLOOD  Result Value Ref Range Status   Specimen Description BLOOD BLOOD LEFT ARM  Final   Special Requests   Final    BOTTLES DRAWN AEROBIC AND ANAEROBIC Blood Culture adequate volume   Culture   Final    NO GROWTH 4 DAYS Performed at [facility], [address] [place], [place] [address]    Report Status PENDING  Incomplete  MRSA Next Gen by PCR, Nasal     Status: None   Collection Time: 01/29/23  1:47 PM   Specimen: Nasal Mucosa; Nasal Swab  Result Value Ref Range Status   MRSA by PCR Next Gen NOT DETECTED NOT DETECTED Final    Comment: (NOTE) The GeneXpert MRSA Assay (FDA approved for NASAL specimens only), is one component of a comprehensive MRSA colonization surveillance program. It is not intended to diagnose MRSA infection nor to guide or monitor treatment for MRSA infections. Test performance is not FDA approved in patients less than 49 years old. Performed at [facility], [address] [place], [place] [address]   Culture, blood (Routine X 2) w Reflex to ID Panel     Status: None (Preliminary result)   Collection Time: 01/29/23  2:10 PM   Specimen: BLOOD  Result Value  Ref Range Status   Specimen Description BLOOD A-LINE  Final   Special  Requests   Final    BOTTLES DRAWN AEROBIC AND ANAEROBIC Blood Culture adequate volume   Culture   Final    NO GROWTH 4 DAYS Performed at [facility], [address] [place], [place] [address]    Report Status PENDING  Incomplete    Coagulation Studies: No results for input(s): "LABPROT", "INR" in the last 72 hours.   Urinalysis: No results for input(s): "COLORURINE", "LABSPEC", "PHURINE", "GLUCOSEU", "HGBUR", "BILIRUBINUR", "KETONESUR", "PROTEINUR", "UROBILINOGEN", "NITRITE", "LEUKOCYTESUR" in the last 72 hours.  Invalid input(s): "APPERANCEUR"     Imaging: DG Chest Port 1 View  Result Date: 02/01/2023 CLINICAL DATA:  Dyspnea EXAM: PORTABLE CHEST 1 VIEW COMPARISON:  01/29/2023 FINDINGS: There is pulmonary vascular congestion. Bibasilar consolidation or volume loss. No pneumothorax. Possible small pleural effusions. Endotracheal tube tip 3 cm above carina. NG tube tip below the diaphragm and off x-ray. Mediastinal drain or probe overlies the mid thorax. IMPRESSION: Vascular congestion.  Bibasilar consolidation or volume loss. Electronically Signed   By: Sammie Bench M.D.   On: 02/01/2023 13:56     Medications:    sodium chloride Stopped (02/02/23 0547)   ceFEPime (MAXIPIME) IV 200 mL/hr at 02/02/23 0600   feeding supplement (VITAL AF 1.2 CAL) 1,000 mL (02/02/23 [id number])   fentaNYL infusion INTRAVENOUS 150 mcg/hr (02/02/23 0600)   heparin 10,000 units/ 20 mL infusion syringe 500 Units/hr (02/02/23 1100)   midazolam 2 mg/hr (02/02/23 0600)   norepinephrine (LEVOPHED) Adult infusion 3 mcg/min (02/02/23 1103)   prismasol BGK 2/2.5 dialysis solution 2,000 mL/hr at 02/02/23 0915   prismasol BGK 2/2.5 replacement solution 400 mL/hr at 02/02/23 0915   prismasol BGK 2/2.5 replacement solution 400 mL/hr at 02/02/23 0915    aspirin  81 mg Per Tube Daily   Chlorhexidine Gluconate Cloth  6 each Topical Daily   docusate  100 mg Per Tube BID   feeding supplement (PROSource  TF20)  60 mL Per Tube BID   folic acid  1 mg Per Tube Daily   free water  30 mL Per Tube Q4H   gabapentin  100 mg Per Tube QHS   hydrocortisone sod succinate (SOLU-CORTEF) inj  100 mg Intravenous Q12H   LORazepam  1 mg Per Tube Q6H   multivitamin  1 tablet Per Tube QHS   nutrition supplement (JUVEN)  1 packet Per Tube BID BM   mouth rinse  15 mL Mouth Rinse Q2H   pantoprazole (PROTONIX) IV  40 mg Intravenous Daily   polyethylene glycol  17 g Per Tube Daily   potassium & sodium phosphates  2 packet Per Tube Q4H   thiamine (VITAMIN B1) injection  100 mg Intravenous Daily   docusate sodium, fentaNYL, heparin, midazolam, mouth rinse, polyethylene glycol, sodium chloride  Assessment/ Plan:  Mr. Bridges Leising is a 77 y.o. black male with chronic diastolic congestive heart failure, hypertension, COPD, hyperlipidemia, who is admitted to [facility] on 01/29/2023 for Peripheral edema [R60.9] Shock circulatory ([place]) [R57.9] Acute on chronic congestive heart failure, unspecified heart failure type ([facility]) [I50.9] Acute renal failure superimposed on chronic kidney disease, unspecified acute renal failure type, unspecified CKD stage ([place]) [N17.9, N18.9]  Acute Kidney Injury on chronic kidney disease stage IV: baseline creatinine of 2.67, GFR of 24 on 01/06/23. History of bland urine. Acute kidney injury most likely secondary to cardiorenal syndrome. Chronic kidney disease secondary to hypertension.  Patient tolerated CRRT well yesterday.  Net  UF was 4.3 kg yesterday.  Volume status does appear to be improved.  Continue CRRT at this time with ongoing volume removal.  Acute exacerbation of chronic diastolic congestive heart failure and acute respiratory failure requiring intubation and mechanical ventilation.  Volume status improved.  Defer weaning to pulmonary/critical care.  Hypotension with cardiogenic shock requiring vasopressors: Patient remains on norepinephrine this a.m.  Continue to maintain a MAP of 65 or  greater.   LOS: 4 Shley Dolby 2/18/202411:19 AM

## 2023-02-02 NOTE — Progress Notes (Signed)
PHARMACY CONSULT NOTE   Pharmacy Consult for Electrolyte Monitoring and Replacement   Recent Labs: Potassium (mmol/L)  Date Value  02/02/2023 3.7   Magnesium (mg/dL)  Date Value  02/02/2023 1.9   Calcium (mg/dL)  Date Value  02/02/2023 8.8 (L)   Albumin (g/dL)  Date Value  02/02/2023 2.6 (L)   Phosphorus (mg/dL)  Date Value  02/02/2023 2.4 (L)   Sodium (mmol/L)  Date Value  02/02/2023 135    Assessment: 77 year old male PMH HTN, CKD4, diastolic and systolic heart failure with cor pulmonale, COPD on 2L PTA, history ascending aortic aneurysm, CAD. CRRT with effluent rate 25 mL/kg/hr.  Pressor support with Levophed and vasopressin.  Nutrition: tube feeds  -on CRRT  Goal of Therapy:  K > 4 Mag > 2 All other electrolytes within normal limits  Plan:  2/18 @ 0413:  K = 3.7,   Mag = 1.9 Will order KCl 10 mEq IV X 3 and Mag Sulfate 1 gm IV X 1.  Phos 2.3 - will order Phos-NaK 2 packets per tube q4h x 2 doses Nephrology following on CRRT Electrolytes @0500$  and @1600$  while on CRRT  Noralee Space ,PharmD Clinical Pharmacist 02/02/2023 11:02 AM

## 2023-02-03 ENCOUNTER — Inpatient Hospital Stay: Payer: Medicare HMO

## 2023-02-03 DIAGNOSIS — G928 Other toxic encephalopathy: Secondary | ICD-10-CM

## 2023-02-03 DIAGNOSIS — Z515 Encounter for palliative care: Secondary | ICD-10-CM | POA: Diagnosis not present

## 2023-02-03 DIAGNOSIS — R579 Shock, unspecified: Secondary | ICD-10-CM | POA: Diagnosis not present

## 2023-02-03 DIAGNOSIS — N189 Chronic kidney disease, unspecified: Secondary | ICD-10-CM | POA: Diagnosis not present

## 2023-02-03 DIAGNOSIS — I509 Heart failure, unspecified: Secondary | ICD-10-CM | POA: Diagnosis not present

## 2023-02-03 DIAGNOSIS — N179 Acute kidney failure, unspecified: Secondary | ICD-10-CM | POA: Diagnosis not present

## 2023-02-03 LAB — RENAL FUNCTION PANEL
Albumin: 2.4 g/dL — ABNORMAL LOW (ref 3.5–5.0)
Albumin: 2.8 g/dL — ABNORMAL LOW (ref 3.5–5.0)
Anion gap: 4 — ABNORMAL LOW (ref 5–15)
Anion gap: 6 (ref 5–15)
BUN: 43 mg/dL — ABNORMAL HIGH (ref 8–23)
BUN: 55 mg/dL — ABNORMAL HIGH (ref 8–23)
CO2: 24 mmol/L (ref 22–32)
CO2: 26 mmol/L (ref 22–32)
Calcium: 7.8 mg/dL — ABNORMAL LOW (ref 8.9–10.3)
Calcium: 9 mg/dL (ref 8.9–10.3)
Chloride: 108 mmol/L (ref 98–111)
Chloride: 102 mmol/L (ref 98–111)
Creatinine, Ser: 1.28 mg/dL — ABNORMAL HIGH (ref 0.61–1.24)
Creatinine, Ser: 1.87 mg/dL — ABNORMAL HIGH (ref 0.61–1.24)
GFR, Estimated: 58 mL/min — ABNORMAL LOW (ref >60)
GFR, Estimated: 37 mL/min — ABNORMAL LOW (ref >60)
Glucose, Bld: 143 mg/dL — ABNORMAL HIGH (ref 70–99)
Glucose, Bld: 153 mg/dL — ABNORMAL HIGH (ref 70–99)
Phosphorus: 1.8 mg/dL — ABNORMAL LOW (ref 2.5–4.6)
Phosphorus: 3.9 mg/dL (ref 2.5–4.6)
Potassium: 2.9 mmol/L — ABNORMAL LOW (ref 3.5–5.1)
Potassium: 4 mmol/L (ref 3.5–5.1)
Sodium: 136 mmol/L (ref 135–145)
Sodium: 134 mmol/L — ABNORMAL LOW (ref 135–145)

## 2023-02-03 LAB — CBC WITH DIFFERENTIAL/PLATELET
Abs Immature Granulocytes: 0.06 10*3/uL (ref 0.00–0.07)
Basophils Absolute: 0 10*3/uL (ref 0.0–0.1)
Basophils Relative: 0 %
Eosinophils Absolute: 0 10*3/uL (ref 0.0–0.5)
Eosinophils Relative: 0 %
HCT: 46.4 % (ref 39.0–52.0)
Hemoglobin: 14.6 g/dL (ref 13.0–17.0)
Immature Granulocytes: 0 %
Lymphocytes Relative: 4 %
Lymphs Abs: 0.6 10*3/uL — ABNORMAL LOW (ref 0.7–4.0)
MCH: 32.4 pg (ref 26.0–34.0)
MCHC: 31.5 g/dL (ref 30.0–36.0)
MCV: 103.1 fL — ABNORMAL HIGH (ref 80.0–100.0)
Monocytes Absolute: 1.4 10*3/uL — ABNORMAL HIGH (ref 0.1–1.0)
Monocytes Relative: 10 %
Neutro Abs: 11.9 10*3/uL — ABNORMAL HIGH (ref 1.7–7.7)
Neutrophils Relative %: 86 %
Platelets: 38 10*3/uL — ABNORMAL LOW (ref 150–400)
RBC: 4.5 MIL/uL (ref 4.22–5.81)
RDW: 15.7 % — ABNORMAL HIGH (ref 11.5–15.5)
WBC: 14 10*3/uL — ABNORMAL HIGH (ref 4.0–10.5)
nRBC: 0 % (ref 0.0–0.2)

## 2023-02-03 LAB — MAGNESIUM: Magnesium: 1.8 mg/dL (ref 1.7–2.4)

## 2023-02-03 LAB — CBC
HCT: 42.7 % (ref 39.0–52.0)
Hemoglobin: 13.2 g/dL (ref 13.0–17.0)
MCH: 32.3 pg (ref 26.0–34.0)
MCHC: 30.9 g/dL (ref 30.0–36.0)
MCV: 104.4 fL — ABNORMAL HIGH (ref 80.0–100.0)
Platelets: 38 10*3/uL — ABNORMAL LOW (ref 150–400)
RBC: 4.09 MIL/uL — ABNORMAL LOW (ref 4.22–5.81)
RDW: 15.6 % — ABNORMAL HIGH (ref 11.5–15.5)
WBC: 12.6 10*3/uL — ABNORMAL HIGH (ref 4.0–10.5)
nRBC: 0 % (ref 0.0–0.2)

## 2023-02-03 LAB — BLOOD GAS, ARTERIAL
Acid-Base Excess: 0.8 mmol/L (ref 0.0–2.0)
Bicarbonate: 27.1 mmol/L (ref 20.0–28.0)
O2 Saturation: 98.9 %
PEEP: 5 cmH2O
Patient temperature: 37
Pressure support: 8 cmH2O
pCO2 arterial: 49 mmHg — ABNORMAL HIGH (ref 32–48)
pH, Arterial: 7.35 (ref 7.35–7.45)
pO2, Arterial: 91 mmHg (ref 83–108)

## 2023-02-03 LAB — CULTURE, BLOOD (ROUTINE X 2)
Culture: NO GROWTH
Culture: NO GROWTH
Special Requests: ADEQUATE
Special Requests: ADEQUATE

## 2023-02-03 LAB — HEPATIC FUNCTION PANEL
ALT: 10 U/L (ref 0–44)
AST: 17 U/L (ref 15–41)
Albumin: 2.3 g/dL — ABNORMAL LOW (ref 3.5–5.0)
Alkaline Phosphatase: 50 U/L (ref 38–126)
Bilirubin, Direct: 0.7 mg/dL — ABNORMAL HIGH (ref 0.0–0.2)
Indirect Bilirubin: 0.9 mg/dL (ref 0.3–0.9)
Total Bilirubin: 1.6 mg/dL — ABNORMAL HIGH (ref 0.3–1.2)
Total Protein: 5 g/dL — ABNORMAL LOW (ref 6.5–8.1)

## 2023-02-03 LAB — APTT: aPTT: 28 seconds (ref 24–36)

## 2023-02-03 LAB — BRAIN NATRIURETIC PEPTIDE: B Natriuretic Peptide: 1706.8 pg/mL — ABNORMAL HIGH (ref 0.0–100.0)

## 2023-02-03 LAB — GLUCOSE, CAPILLARY
Glucose-Capillary: 165 mg/dL — ABNORMAL HIGH (ref 70–99)
Glucose-Capillary: 141 mg/dL — ABNORMAL HIGH (ref 70–99)
Glucose-Capillary: 181 mg/dL — ABNORMAL HIGH (ref 70–99)
Glucose-Capillary: 157 mg/dL — ABNORMAL HIGH (ref 70–99)
Glucose-Capillary: 167 mg/dL — ABNORMAL HIGH (ref 70–99)
Glucose-Capillary: 155 mg/dL — ABNORMAL HIGH (ref 70–99)

## 2023-02-03 LAB — AMMONIA: Ammonia: 45 umol/L — ABNORMAL HIGH (ref 9–35)

## 2023-02-03 MED ORDER — PRISMASOL BGK 4/2.5 32-4-2.5 MEQ/L REPLACEMENT SOLN: Status: DC

## 2023-02-03 MED ORDER — POTASSIUM PHOSPHATES 15 MMOLE/5ML IV SOLN
30.0000 mmol | Freq: Once | INTRAVENOUS | Status: AC
  Administered 2023-02-03: 30 mmol via INTRAVENOUS
  Filled 2023-02-03: qty 10

## 2023-02-03 MED ORDER — POLYETHYLENE GLYCOL 3350 17 G PO PACK
17.0000 g | PACK | Freq: Two times a day (BID) | ORAL | Status: DC
  Administered 2023-02-05: 17 g
  Filled 2023-02-03: qty 1

## 2023-02-03 MED ORDER — LACTULOSE 10 GM/15ML PO SOLN
20.0000 g | Freq: Once | ORAL | Status: AC
  Administered 2023-02-03: 20 g
  Filled 2023-02-03: qty 30

## 2023-02-03 MED ORDER — DEXMEDETOMIDINE HCL IN NACL 400 MCG/100ML IV SOLN
0.0000 ug/kg/h | INTRAVENOUS | Status: DC
  Administered 2023-02-04: 0.4 ug/kg/h via INTRAVENOUS
  Administered 2023-02-05: 0.5 ug/kg/h via INTRAVENOUS
  Administered 2023-02-05: 0.9 ug/kg/h via INTRAVENOUS
  Filled 2023-02-03 (×2): qty 100

## 2023-02-03 MED ORDER — PRISMASOL BGK 4/2.5 32-4-2.5 MEQ/L EC SOLN: Status: DC

## 2023-02-03 MED ORDER — HYDROCORTISONE SOD SUC (PF) 100 MG IJ SOLR
50.0000 mg | Freq: Two times a day (BID) | INTRAMUSCULAR | Status: DC
  Administered 2023-02-04 – 2023-02-05 (×4): 50 mg via INTRAVENOUS
  Filled 2023-02-03 (×4): qty 2

## 2023-02-03 NOTE — Progress Notes (Signed)
PHARMACY CONSULT NOTE   Pharmacy Consult for Electrolyte Monitoring and Replacement   Recent Labs: Potassium (mmol/L)  Date Value  02/03/2023 2.9 (L)   Magnesium (mg/dL)  Date Value  02/03/2023 1.8   Calcium (mg/dL)  Date Value  02/03/2023 7.8 (L)   Albumin (g/dL)  Date Value  02/03/2023 2.4 (L)   Phosphorus (mg/dL)  Date Value  02/03/2023 1.8 (L)   Sodium (mmol/L)  Date Value  02/03/2023 136    Assessment: 77 year old male PMH HTN, CKD4, diastolic and systolic heart failure with cor pulmonale, COPD on 2L PTA, history ascending aortic aneurysm, CAD. CRRT with effluent rate 25 mL/kg/hr.  Pressor support with Levophed and vasopressin.  Nutrition: tube feeds on CRRT  Goal of Therapy:  K > 4 Mag > 2 All other electrolytes within normal limits  Plan: 2/19 @ 0520: K 2.9, Phos 1.8, Ca 9.1, Mg 1.8 Potassium phosphate IV 30 mmol x 1 (gives 44 mEq K, as well) Nephrology following on CRRT Electrolytes @0500$  and @1600$  while on CRRT  Will M. Ouida Sills, PharmD PGY-1 Pharmacy Resident 02/03/2023 8:37 AM

## 2023-02-03 NOTE — Consult Note (Signed)
[place] NOTE       Patient ID: John [name] MRN: [id number] DOB/AGE: 07/15/1946 77 y.o.  Admit date: 01/29/2023 Referring Physician Darel Hong, NP  Primary Physician Dr. Ginette Pitman Primary Cardiologist Dr. Nehemiah Massed  Reason for Consultation elevated troponin / AoCHF  HPI: John [name] is a 77yoM with a PMH of HFpEF (50-55%, G1 DD with a D-shaped LV c/w RV overload, mi-mod TR 10/02/2022), COPD (prn 2 L), CKD 4, chronic thrombocytopenia, HTN, hx tobacco use who presented to [facility] ED 01/28/2023 at the request of his nephrologist after outpatient labs showed acutely worsening renal function.  He was brought in by EMS with altered mental status and in circulatory shock requiring vasopressors, CRRT for acute renal failure, and mechanical intubation. Troponins checked on admission were elevated and trended 2351, 2252, and 1824. BNP 2500. Cardiology is consulted on hospital day 5 for assistance with his heart failure.   Patient's well-known to our service from 2 prior admissions in 2023 for acute on chronic HFpEF in the setting of AKI on CKD. History is obtained entirely from chart as the patient is intubated and sedated.  No family present at bedside.  The patient has been followed almost exclusively by the heart failure clinic for his cardiac issues and adjustment of his diuretics and GDMT.  He was seen most recently at the HF clinic on 2/13 where his weight was up 12 pounds from his last visit to the HF clinic 3 weeks prior and per documentation was significantly fluid overloaded with peripheral edema.  Labs checked at that visit showed acute renal failure with creatinine 5.41 and GFR 10, up from 3 weeks prior at 2.67/24 respectively.  His nephrologist was contacted who recommended the patient present to the emergency department given these labs and clinical worsening.  Per the patient's significant other, the patient has been increasingly somnolent, also not sleeping well with  decreased urine output despite aggressive diuretics.  He was hypotensive on presentation requiring Levophed and decompensated the following afternoon with severe acidosis and hypoxemia plus hypercapnia and not responding to BiPAP and requiring urgent mechanical ventilation with two-physician consent on 2/14.  CRRT was started. Echo this admission showed severe RV dysfunction with concern for pulmonary embolus, CTA PE protocol was negative for pulmonary embolus, did reveal a 5.2 cm thoracic ascending aortic aneurysm, enlarged central pulmonary arteries compatible with PAH, small to moderate right pleural effusion and small left pleural effusion, changes of COPD and central lobar and paraseptal emphysema, findings suggestive of cirrhosis of the liver with an associated small-moderate amount of ascites in upper abdomen, calcific coronary arteries and aortic atherosclerosis. Troponins were elevated + EKG showing sinus rhythm with nonspecific T wave changes laterally with concern for NSTEMI vs demand ischemia.   He remains critically ill today, still requiring low dose levophed and is still oliguric. CRRT paused and plan from nephrology is a possible trial of HD on Wednesday if BP is stable off vasopressors. Net neg 6L removed via CRRT.  Remains intubated and off sedation, mental status currently precluding extubation.  Recent vitals notable for blood pressure in the 90s/70s per A-line, in sinus tachycardia on telemetry with rate in the low 100s.  Recent labs notable for hypokalemia with potassium 2.9, BUN/creatinine 43/1.28 and GFR 58 s/p CRRT since 2/14.  BNP elevated at 1700, improved from admission at 2500.   Review of systems complete and found to be negative unless listed above     Past Medical History:  Diagnosis Date  CHF (congestive heart failure) (HCC)    EF 45% in 2021   Chronic kidney disease    COPD (chronic obstructive pulmonary disease) ([place]) 02/16/2020   Hyperlipidemia    Hypertension     Pneumonia 2021    Past Surgical History:  Procedure Laterality Date   CATARACT EXTRACTION W/PHACO Left 10/31/2021   Procedure: CATARACT EXTRACTION PHACO AND INTRAOCULAR LENS PLACEMENT ([place]) LEFT VISION BLUE 3.45 00:48.0;  Surgeon: Leandrew Koyanagi, MD;  Location: [place];  Service: Ophthalmology;  Laterality: Left;   CATARACT EXTRACTION W/PHACO Right 11/14/2021   Procedure: CATARACT EXTRACTION PHACO AND INTRAOCULAR LENS PLACEMENT ([facility]) RIGHT 8.59 01:10.5;  Surgeon: Leandrew Koyanagi, MD;  Location: [place];  Service: Ophthalmology;  Laterality: Right;   INGUINAL HERNIA REPAIR Right 02/04/2017   Procedure: HERNIA REPAIR INGUINAL ADULT;  Surgeon: Leonie Green, MD;  Location: [facility];  Service: General;  Laterality: Right;   NO PAST SURGERIES     QUADRICEPS TENDON REPAIR Left 02/15/2020   Procedure: REPAIR QUADRICEP TENDON;  Surgeon: Corky Mull, MD;  Location: [facility];  Service: Orthopedics;  Laterality: Left;    Medications Prior to Admission  Medication Sig Dispense Refill Last Dose   aspirin EC 81 MG tablet Take 1 tablet (81 mg total) by mouth daily. Swallow whole. 90 tablet 3 01/28/2023   calcitRIOL (ROCALTROL) 0.25 MCG capsule Take 0.25 mcg by mouth daily.   01/28/2023   cephALEXin (KEFLEX) 500 MG capsule Take 500 mg by mouth 3 (three) times daily.   01/28/2023   gabapentin (NEURONTIN) 100 MG capsule Take 1 capsule (100 mg total) by mouth 2 (two) times daily.   01/28/2023   ipratropium-albuterol (DUONEB) 0.5-2.5 (3) MG/3ML SOLN Inhale 3 mLs into the lungs every 6 (six) hours as needed (wheezing).   unknown   losartan (COZAAR) 50 MG tablet Take 50 mg by mouth daily.   01/28/2023   metolazone (ZAROXOLYN) 2.5 MG tablet Take 1 tablet (2.5 mg total) by mouth 2 (two) times a week.   Past Week   midodrine (PROAMATINE) 2.5 MG tablet Take 2.5 mg by mouth 3 (three) times daily.   01/28/2023   omeprazole (PRILOSEC) 20 MG capsule Take 20 mg by mouth daily.    01/28/2023   potassium chloride SA (KLOR-CON M) 20 MEQ tablet Take 1 tablet (20 mEq total) by mouth daily. With extra 55mq on M/ F with metolazone 100 tablet 3 Past Week   torsemide 60 MG TABS Take 60 mg by mouth daily. 30 tablet 1 01/28/2023   Social History   Socioeconomic History   Marital status: Single    Spouse name: Not on file   Number of children: Not on file   Years of education: Not on file   Highest education level: Not on file  Occupational History   Not on file  Tobacco Use   Smoking status: Former    Packs/day: 0.25    Years: 53.00    Total pack years: 13.25    Types: Cigarettes    Quit date: 02/15/2015    Years since quitting: 7.9   Smokeless tobacco: Never  Substance and Sexual Activity   Alcohol use: Yes    Comment: occassional   Drug use: No   Sexual activity: Not on file  Other Topics Concern   Not on file  Social History Narrative   Not on file   Social Determinants of Health   Financial Resource Strain: Low Risk  (07/17/2022)   Overall FEmergency planning/management officerStrain (  CARDIA)    Difficulty of Paying Living Expenses: Not hard at all  Food Insecurity: No Food Insecurity (01/29/2023)   Hunger Vital Sign    Worried About Running Out of Food in the Last Year: Never true    Ran Out of Food in the Last Year: Never true  Transportation Needs: No Transportation Needs (01/29/2023)   PRAPARE - Hydrologist (Medical): No    Lack of Transportation (Non-Medical): No  Physical Activity: Sufficiently Active (07/17/2022)   Exercise Vital Sign    Days of Exercise per Week: 5 days    Minutes of Exercise per Session: 60 min  Stress: No Stress Concern Present (07/17/2022)   [place]    Feeling of Stress : Not at all  Social Connections: [place] (07/17/2022)   Social Connection and Isolation Panel [NHANES]    Frequency of Communication with Friends and Family: More than  three times a week    Frequency of Social Gatherings with Friends and Family: More than three times a week    Attends Religious Services: 1 to 4 times per year    Active Member of Clubs or Organizations: Yes    Attends Archivist Meetings: More than 4 times per year    Marital Status: Living with partner  Intimate Partner Violence: Not At Risk (01/29/2023)   Humiliation, Afraid, Rape, and Kick questionnaire    Fear of Current or Ex-Partner: No    Emotionally Abused: No    Physically Abused: No    Sexually Abused: No    Family History  Problem Relation Age of Onset   Diabetes Mother    Diabetes Brother    Diabetes Maternal Grandmother    Diabetes Paternal Grandmother       Intake/Output Summary (Last 24 hours) at 02/03/2023 1331 Last data filed at 02/03/2023 1300 Gross per 24 hour  Intake 4768.09 ml  Output 6388.5 ml  Net -1620.41 ml    Vitals:   02/03/23 1230 02/03/23 1245 02/03/23 1300 02/03/23 1315  BP:   98/66   Pulse: (!) 103 (!) 103 (!) 105 (!) 102  Resp: (!) 29 (!) 28 (!) 30 (!) 26  Temp: 98.1 F (36.7 C) 98.1 F (36.7 C) 98.1 F (36.7 C) 98.1 F (36.7 C)  TempSrc:      SpO2: 94% 95% 94% 95%  Weight:      Height:        PHYSICAL EXAM General: Acute on chronically ill-appearing black male, in no acute distress. Intubated and unresponsive.  HEENT:  Normocephalic and atraumatic. Neck:  No JVD.  Lungs: mechanically ventilated breath sounds present bilaterally, coarse to ausculation anteriorly.  Heart: tachy but regular. Normal S1 and S2 without gallops or murmurs.  Abdomen: distended Msk:  Extremities: No clubbing, cyanosis. Trace bilateral peripheral edema, chronic hyperpigmentation + skin wrinkling/dimpling Neuro: chewing on ET tube, moves head to voice, does not open eyes Psych:  unable to assess  Labs: Basic Metabolic Panel: Recent Labs    02/02/23 0413 02/02/23 1605 02/03/23 0520  NA 135 133* 136  K 3.7 3.4* 2.9*  CL 105 102 108  CO2  '26 25 24  '$ GLUCOSE 187* 135* 143*  BUN 35* 37* 43*  CREATININE 1.60* 1.57* 1.28*  CALCIUM 8.8* 9.3 7.8*  MG 1.9  --  1.8  PHOS 2.4* 2.5 1.8*   Liver Function Tests: Recent Labs    02/02/23 1605 02/03/23 0520  AST  --  17  ALT  --  10  ALKPHOS  --  50  BILITOT  --  1.6*  PROT  --  5.0*  ALBUMIN 3.1* 2.3*  2.4*   No results for input(s): "LIPASE", "AMYLASE" in the last 72 hours. CBC: Recent Labs    02/03/23 0520 02/03/23 0956  WBC 12.6* 14.0*  NEUTROABS  --  11.9*  HGB 13.2 14.6  HCT 42.7 46.4  MCV 104.4* 103.1*  PLT 38* 38*   Cardiac Enzymes: No results for input(s): "CKTOTAL", "CKMB", "CKMBINDEX", "TROPONINIHS" in the last 72 hours. BNP: Recent Labs    02/03/23 0956  BNP 1,706.8*   D-Dimer: No results for input(s): "DDIMER" in the last 72 hours. Hemoglobin A1C: No results for input(s): "HGBA1C" in the last 72 hours. Fasting Lipid Panel: No results for input(s): "CHOL", "HDL", "LDLCALC", "TRIG", "CHOLHDL", "LDLDIRECT" in the last 72 hours. Thyroid Function Tests: No results for input(s): "TSH", "T4TOTAL", "T3FREE", "THYROIDAB" in the last 72 hours.  Invalid input(s): "FREET3" Anemia Panel: No results for input(s): "VITAMINB12", "FOLATE", "FERRITIN", "TIBC", "IRON", "RETICCTPCT" in the last 72 hours.   Radiology: CT HEAD WO CONTRAST (5MM)  Result Date: 02/03/2023 CLINICAL DATA:  Altered mental status EXAM: CT HEAD WITHOUT CONTRAST TECHNIQUE: Contiguous axial images were obtained from the base of the skull through the vertex without intravenous contrast. RADIATION DOSE REDUCTION: This exam was performed according to the departmental dose-optimization program which includes automated exposure control, adjustment of the mA and/or kV according to patient size and/or use of iterative reconstruction technique. COMPARISON:  None Available. FINDINGS: Brain: No evidence of acute infarction, hemorrhage, mass, mass effect, or midline shift. No hydrocephalus or extra-axial  fluid collection. Partial empty sella. Normal craniocervical junction. Cerebral volume is within normal limits for age. Vascular: No hyperdense vessel. Skull: Negative for fracture or focal lesion. Sinuses/Orbits: Mucosal thickening in the left greater than right maxillary sinus. Other: Underpneumatized right mastoid, with trace fluid. The left mastoid air cells are well aerated. Endotracheal and orogastric tubes noted. IMPRESSION: No acute intracranial process. Electronically Signed   By: Merilyn Baba M.D.   On: 02/03/2023 03:03   DG Chest Port 1 View  Result Date: 02/01/2023 CLINICAL DATA:  Dyspnea EXAM: PORTABLE CHEST 1 VIEW COMPARISON:  01/29/2023 FINDINGS: There is pulmonary vascular congestion. Bibasilar consolidation or volume loss. No pneumothorax. Possible small pleural effusions. Endotracheal tube tip 3 cm above carina. NG tube tip below the diaphragm and off x-ray. Mediastinal drain or probe overlies the mid thorax. IMPRESSION: Vascular congestion.  Bibasilar consolidation or volume loss. Electronically Signed   By: Sammie Bench M.D.   On: 02/01/2023 13:56   CT Angio Chest Pulmonary Embolism (PE) W or WO Contrast  Result Date: 01/30/2023 CLINICAL DATA:  Chronic hypoxemia and hypercapnia related to COPD, congestive heart failure and chronic renal failure. EXAM: CT ANGIOGRAPHY CHEST WITH CONTRAST TECHNIQUE: Multidetector CT imaging of the chest was performed using the standard protocol during bolus administration of intravenous contrast. Multiplanar CT image reconstructions and MIPs were obtained to evaluate the vascular anatomy. RADIATION DOSE REDUCTION: This exam was performed according to the departmental dose-optimization program which includes automated exposure control, adjustment of the mA and/or kV according to patient size and/or use of iterative reconstruction technique. CONTRAST:  11m OMNIPAQUE IOHEXOL 350 MG/ML SOLN COMPARISON:  Portable chest obtained yesterday. Chest, abdomen and  pelvis CT dated 01/18/2022. FINDINGS: Cardiovascular: Enlarged heart. Enlarged ascending thoracic aorta measuring 5.2 cm in diameter at the level of the main pulmonary artery. Enlarged central  pulmonary arteries with a main pulmonary artery measuring 3.6 cm in diameter. Maximum aortic arch diameter of 3.2 cm. Descending thoracic aortic diameter of 3.1 cm. Normally opacified pulmonary arteries with no pulmonary arterial filling defects seen. Atheromatous calcifications, including the coronary arteries and aorta. Mediastinum/Nodes: Endotracheal tube in satisfactory position. Enteric tube extending through the esophagus with its tip in the proximal to mid stomach. No enlarged lymph nodes. Unremarkable thyroid gland. Lungs/Pleura: Small to moderate-sized right pleural effusion and small left pleural effusion. Diffuse bilateral centrilobular and some paraseptal bullous changes, most pronounced in the upper lobes. Mild bilateral lower lobe dependent atelectasis. No airspace consolidation suspicious for pneumonia. No lung nodules. Upper Abdomen: The liver has mildly nodular contours with somewhat prominent lateral segment left lobe and caudate lobe and somewhat small right lobe. Two small probable cysts in the right lobe of the liver. Normal sized spleen. Small to moderate amount of ascites in the upper abdomen. Musculoskeletal: Minimal thoracic spine degenerative changes. Review of the MIP images confirms the above findings. IMPRESSION: 1. No pulmonary emboli. 2. Ascending thoracic aortic aneurysm measuring 5.2 cm in maximum diameter. Recommend semi-annual imaging followup by CTA or MRA and referral to cardiothoracic surgery if not already obtained. This recommendation follows 2010 ACCF/AHA/AATS/ACR/ASA/SCA/SCAI/SIR/STS/SVM Guidelines for the Diagnosis and Management of Patients With Thoracic Aortic Disease. Circulation. 2010; [id number]. Aortic aneurysm NOS (ICD10-I71.9) 3. Enlarged central pulmonary arteries  compatible with pulmonary arterial hypertension. 4. Small to moderate-sized right pleural effusion and small left pleural effusion. 5. Changes of COPD and centrilobular and paraseptal emphysema. 6. Findings suggesting cirrhosis of the liver with an associated small to moderate amount of ascites in the upper abdomen. 7.  Calcific coronary artery and aortic atherosclerosis. Aortic Atherosclerosis (ICD10-I70.0) and Emphysema (ICD10-J43.9). Electronically Signed   By: Claudie Revering M.D.   On: 01/30/2023 18:48   ECHOCARDIOGRAM COMPLETE  Result Date: 01/30/2023    ECHOCARDIOGRAM REPORT   Patient Name:   John [name] Date of Exam: 01/30/2023 Medical Rec #:  [id number]      Height:       71.0 in Accession #:    [id number]     Weight:       223.3 lb Date of Birth:  August 17, 1946     BSA:          2.210 m Patient Age:    85 years       BP:           163/69 mmHg Patient Gender: M              HR:           58 bpm. Exam Location:  [facility] Procedure: 2D Echo, Cardiac Doppler and Color Doppler Indications:     Elevated Troponin  History:         Patient has prior history of Echocardiogram examinations, most                  recent 10/02/2022. CHF, COPD; Risk Factors:Hypertension.                  Cardiogenic shock, ESRD.  Sonographer:     Wenda Low Referring Phys:  [id number] BRITTON L [place] Diagnosing Phys: Ida Rogue MD  Sonographer Comments: Suboptimal apical window and echo performed with patient supine and on artificial respirator. IMPRESSIONS  1. Left ventricular ejection fraction, by estimation, is 50 to 55%. The left ventricle has low normal function. The left ventricle has no regional wall motion abnormalities. There is  mild left ventricular hypertrophy. Left ventricular diastolic parameters are consistent with Grade I diastolic dysfunction (impaired relaxation). There is the interventricular septum is flattened in systole and diastole, consistent with right ventricular pressure and volume overload.  2.  Right ventricular systolic function is severely reduced. The right ventricular size is moderately enlarged. There is severely elevated pulmonary artery systolic pressure. The estimated right ventricular systolic pressure is [id number] mmHg.  3. Right atrial size was moderately dilated.  4. The mitral valve is normal in structure. Mild mitral valve regurgitation. No evidence of mitral stenosis. There is mild late systolic prolapse of both leaflets of the mitral valve.  5. Tricuspid valve regurgitation is mild to moderate.  6. The aortic valve is tricuspid. Aortic valve regurgitation is mild to moderate. No aortic stenosis is present.  7. The inferior vena cava is dilated in size with <50% respiratory variability, suggesting right atrial pressure of 15 mmHg.  8. There is mild dilatation of the aortic root, measuring 44 mm. There is mild dilatation of the ascending aorta, measuring 43 mm. FINDINGS  Left Ventricle: Left ventricular ejection fraction, by estimation, is 50 to 55%. The left ventricle has low normal function. The left ventricle has no regional wall motion abnormalities. The left ventricular internal cavity size was normal in size. There is mild left ventricular hypertrophy. The interventricular septum is flattened in systole and diastole, consistent with right ventricular pressure and volume overload. Left ventricular diastolic parameters are consistent with Grade I diastolic dysfunction (impaired relaxation). Right Ventricle: The right ventricular size is moderately enlarged. No increase in right ventricular wall thickness. Right ventricular systolic function is severely reduced. There is severely elevated pulmonary artery systolic pressure. The tricuspid regurgitant velocity is 3.45 m/s, and with an assumed right atrial pressure of 15 mmHg, the estimated right ventricular systolic pressure is [id number] mmHg. Left Atrium: Left atrial size was normal in size. Right Atrium: Right atrial size was moderately dilated.  Pericardium: There is no evidence of pericardial effusion. Mitral Valve: The mitral valve is normal in structure. There is mild late systolic prolapse of both leaflets of the mitral valve. Mild mitral valve regurgitation. No evidence of mitral valve stenosis. MV peak gradient, 3.9 mmHg. The mean mitral valve gradient is 1.0 mmHg. Tricuspid Valve: The tricuspid valve is normal in structure. Tricuspid valve regurgitation is mild to moderate. No evidence of tricuspid stenosis. Aortic Valve: The aortic valve is tricuspid. Aortic valve regurgitation is mild to moderate. No aortic stenosis is present. Aortic valve mean gradient measures 1.0 mmHg. Aortic valve peak gradient measures 3.9 mmHg. Aortic valve area, by VTI measures 3.79 cm. Pulmonic Valve: The pulmonic valve was normal in structure. Pulmonic valve regurgitation is mild to moderate. No evidence of pulmonic stenosis. Aorta: The aortic root is normal in size and structure. There is mild dilatation of the aortic root, measuring 44 mm. There is mild dilatation of the ascending aorta, measuring 43 mm. Venous: The inferior vena cava is dilated in size with less than 50% respiratory variability, suggesting right atrial pressure of 15 mmHg. IAS/Shunts: No atrial level shunt detected by color flow Doppler.  LEFT VENTRICLE PLAX 2D LVIDd:         4.40 cm   Diastology LVIDs:         3.00 cm   LV e' medial:    6.85 cm/s LV PW:         1.30 cm   LV E/e' medial:  6.3 LV IVS:  1.30 cm   LV e' lateral:   5.98 cm/s LVOT diam:     2.10 cm   LV E/e' lateral: 7.2 LV SV:         68 LV SV Index:   31 LVOT Area:     3.46 cm  RIGHT VENTRICLE RV Basal diam:  5.10 cm RV Mid diam:    5.90 cm RV S prime:     14.50 cm/s TAPSE (M-mode): 1.8 cm LEFT ATRIUM             Index        RIGHT ATRIUM           Index LA diam:        2.90 cm 1.31 cm/m   RA Area:     27.50 cm LA Vol (A2C):   59.3 ml 26.84 ml/m  RA Volume:   101.00 ml 45.71 ml/m LA Vol (A4C):   59.1 ml 26.74 ml/m LA Biplane  Vol: 59.4 ml 26.88 ml/m  AORTIC VALVE                    PULMONIC VALVE AV Area (Vmax):    3.22 cm     PV Vmax:       1.00 m/s AV Area (Vmean):   4.15 cm     PV Peak grad:  4.0 mmHg AV Area (VTI):     3.79 cm AV Vmax:           98.70 cm/s AV Vmean:          45.500 cm/s AV VTI:            0.180 m AV Peak Grad:      3.9 mmHg AV Mean Grad:      1.0 mmHg LVOT Vmax:         91.80 cm/s LVOT Vmean:        54.500 cm/s LVOT VTI:          0.197 m LVOT/AV VTI ratio: 1.09  AORTA Ao Root diam: 4.40 cm Ao Asc diam:  4.30 cm MITRAL VALVE               TRICUSPID VALVE MV Area (PHT): 2.10 cm    TR Peak grad:   47.6 mmHg MV Area VTI:   2.38 cm    TR Vmax:        345.00 cm/s MV Peak grad:  3.9 mmHg MV Mean grad:  1.0 mmHg    SHUNTS MV Vmax:       0.99 m/s    Systemic VTI:  0.20 m MV Vmean:      42.3 cm/s   Systemic Diam: 2.10 cm MV Decel Time: 361 msec MV E velocity: 43.00 cm/s MV A velocity: 84.90 cm/s MV E/A ratio:  0.51 Ida Rogue MD Electronically signed by Ida Rogue MD Signature Date/Time: 01/30/2023/4:10:59 PM    Final    DG Abd Portable 1V  Result Date: 01/30/2023 CLINICAL DATA:  NGT placement EXAM: PORTABLE ABDOMEN - 1 VIEW COMPARISON:  01/29/2023 FINDINGS: Image of the upper abdomen demonstrates a NG tube superimposed with the stomach below the diaphragm. IMPRESSION: NG tube appropriately positioned. Electronically Signed   By: Sammie Bench M.D.   On: 01/30/2023 13:12   DG Chest Port 1 View  Result Date: 01/29/2023 CLINICAL DATA:  Intubation EXAM: PORTABLE CHEST 1 VIEW COMPARISON:  01/29/2023 8:30 a.m. FINDINGS: Interval placement of endotracheal tube, tip over the midtrachea, and esophagogastric tube, tip  not visualized although below the diaphragm. Cardiomegaly. Small bilateral pleural effusions. Osseous structures unremarkable. IMPRESSION: 1. Interval placement of endotracheal tube, tip over the midtrachea, and esophagogastric tube, tip not visualized although below the diaphragm. 2. Cardiomegaly.  3. Small bilateral pleural effusions. Electronically Signed   By: Delanna Ahmadi M.D.   On: 01/29/2023 17:03   DG Abd 1 View  Result Date: 01/29/2023 CLINICAL DATA:  Orogastric tube placement. EXAM: ABDOMEN - 1 VIEW COMPARISON:  None Available. FINDINGS: Distal tip of nasogastric tube is seen in expected position of gastroesophageal junction. No abnormal bowel dilatation is noted. IMPRESSION: Distal tip of nasogastric tube is seen in expected position of gastroesophageal junction. Advancement is recommended. Electronically Signed   By: Marijo Conception M.D.   On: 01/29/2023 17:02   DG Chest Portable 1 View  Result Date: 01/29/2023 CLINICAL DATA:  Shortness of breath. EXAM: PORTABLE CHEST 1 VIEW COMPARISON:  01/21/2022 FINDINGS: Leftward patient rotation. The cardio pericardial silhouette is enlarged. Vascular congestion noted with bibasilar atelectasis/infiltrate and probable layering small bilateral effusions. The visualized bony structures of the thorax are unremarkable. Telemetry leads overlie the chest. IMPRESSION: Vascular congestion with bibasilar atelectasis/infiltrate and probable layering small bilateral effusions. Electronically Signed   By: Misty Stanley M.D.   On: 01/29/2023 08:38     TELEMETRY reviewed by me (LT) 02/03/2023 : Sinus rhythm to sinus tachycardia rate 80s-low 100s with occasional PVCs  EKG reviewed by me: NSR nonspecific TW changes  Data reviewed by me (LT) 02/03/2023: ED note, admission H&P, critical care progress note, last 24 hours vitals, imaging, labs, telemetry  Principal Problem:   Shock circulatory ([place])    ASSESSMENT AND PLAN:  John [name] is a 69yoM with a PMH of HFpEF (50-55%, G1 DD with a D-shaped LV c/w RV overload, mi-mod TR 10/02/2022), COPD (prn 2 L), CKD 4, chronic thrombocytopenia, HTN, hx tobacco use who presented to [facility] ED 01/28/2023 at the request of his nephrologist after outpatient labs showed acutely worsening renal function.  He was brought in  by EMS with altered mental status and in circulatory shock requiring vasopressors, CRRT for acute renal failure, and mechanical intubation. Troponins checked on admission were elevated and trended 2351, 2252, and 1824. BNP 2500. Cardiology is consulted on hospital day 5 for assistance with his heart failure.    # acute hypoxic respiratory failure # toxic metabolic encephalopathy # shock In the setting of acute renal failure and AoC HFpEF as below with underlying COPD/pulm HTN. Remains intubated on minimal sedation today, mental status precluding extubation.  -wean vasopressors as tolerated, currently on low dose levo. Has been weaned off vaso   # acute renal failure on CKD IV On torsemide '60mg'$  daily + metolazone 2.'5mg'$  twice weekly, and losartan '50mg'$  daily. Presented with decreased UOP and doubling of Cr per outpatient labs. Now oliguric requiring CRRT.   # acute on chronic HFpEF BNP elevated at 2500 on admission, pleural effusions + severe RV dysfunction & cirrhotic changes on imaging. Unclear of trigger for this decompensation, query progressively worsening renal function in the setting of aggressive outpatient diuretics. Dry weight reportedly ~198lbs.  -volume removal per CRRT -GDMT limited by hypotension requiring vasopressors. On midodrine at baseline.    # demand ischemia Troponins elevated and trending 2351, 2252, and 1824 on admission. Echo with severe RV dysfunction w/o rWMAs. No known hx of CAD.  Suspect demand in setting of acute renal failure and volume overload with possible underlying CAD. Would consider ischemic eval at some  point, but coagulopathy (38K plts) and overall hemodynamic instability preclude this currently.  # thrombocytopenia  Chronic in nature, drift from 91K -- low 32K plts during admission.   This patient's plan of care was discussed and created with Dr. Saralyn Pilar and he is in agreement.  Signed: Tristan Schroeder , PA-C 02/03/2023, 1:31 PM [facility]  Cardiology

## 2023-02-03 NOTE — Progress Notes (Signed)
Arterial line removed per NP Dewaine Conger due to catheter coming out.

## 2023-02-03 NOTE — TOC Progression Note (Signed)
Transition of Care [facility]) - Progression Note    Patient Details  Name: John [name] MRN: [id number] Date of Birth: Nov 22, 1946  Transition of Care [facility]) CM/SW Contact  Ross Ludwig, [place] Phone Number: 02/03/2023, 3:59 PM  Clinical Narrative:     Received a phone call from palliative medicine team.  Per palliative team, patient's significant other does not want to make any decisions.  Patient has a son who he has not spoken to in over 20 years.  Per palliative, patient does not seem to have a decision maker.   Expected Discharge Plan:  (TBD) Barriers to Discharge: Continued Medical Work up  Expected Discharge Plan and Services   Discharge Planning Services: CM Consult   Living arrangements for the past 2 months: Single Family Home                 DME Arranged: N/A                     Social Determinants of Health (SDOH) Interventions SDOH Screenings   Food Insecurity: No Food Insecurity (01/29/2023)  Housing: Low Risk  (01/29/2023)  Transportation Needs: No Transportation Needs (01/29/2023)  Utilities: Not At Risk (01/29/2023)  Alcohol Screen: Low Risk  (07/17/2022)  Depression (PHQ2-9): Low Risk  (07/30/2022)  Financial Resource Strain: Low Risk  (07/17/2022)  Physical Activity: Sufficiently Active (07/17/2022)  Social Connections: Socially Integrated (07/17/2022)  Stress: No Stress Concern Present (07/17/2022)  Tobacco Use: Medium Risk (01/29/2023)    Readmission Risk Interventions    01/30/2023   11:09 AM  Readmission Risk Prevention Plan  Transportation Screening Complete  Medication Review ([place]) Complete  PCP or Specialist appointment within 3-5 days of discharge Complete  HRI or [place] Complete  SW Recovery Care/Counseling Consult Complete  Palliative Care Screening Complete  [place] Not Applicable

## 2023-02-03 NOTE — Progress Notes (Signed)
NAME:  John [name], MRN:  [id number], DOB:  [birth year], LOS: 5 ADMISSION DATE:  01/29/2023, CONSULTATION DATE:  01/29/2023 REFERRING MD:  Dr. Cheri Fowler, CHIEF COMPLAINT:  Altered Mental Status, worsening renal function   Brief Pt Description / Synopsis:  77 y.o. male admitted with shock (? Cardiogenic vs ? Septic), Acute Hypoxic Respiratory Failure in the setting of Acute Decompensated HFpEF, and AKI on CKD Stage IV requiring intubation and mechanical ventilation, along with initiation of CRRT.  History of Present Illness:  This is 77 year old male with a history of essential hypertension, on hydralazine and Cozaar, stage IV CKD but has not been on dialysis in the past, has chronic diastolic and systolic heart failure with cor pulmonale, moderate tricuspid regurgitation and aortic valve regurgitation, history of a ascending aortic aneurysm, coronary artery disease with atherosclerosis, COPD and chronic hypoxemia baseline 2 L/min supplemental oxygen at home with recurrent episodes of hypoxemia and remote acute exacerbation of COPD with hypoxemia and hypercapnia 2 months ago history of ruptured left quadricep tendon 2 years ago, chronic thrombocytopenia and erythrocytosis recurrent lower extremity cellulitis, recurrent hyperkalemia, morbid obesity history of anasarca dyslipidemia, recent admission for acute CHF exacerbation requiring Lasix drip and BiPAP support, who was brought in by EMS due to worsening altered mental status and circulatory shock. I was able to meet with wife at bedside during my evaluation who shares patient has been sleeping majority of each day over the last week has been on arousable and received phone call from medical provider regarding worsening kidney function which prompted ER evaluation. He had chest x-ray performed while in the ER showing bilateral pleural effusions with vascular congestion and atelectasis/infiltrate. Patient in circulatory shock with Levophed support on  admission in ER. PCCM consultation for admission to medical intensive care unit with circulatory shock consistent with acute on chronic renal failure with CHF exacerbation.   Please see "significant hospital events" section below for full detailed hospital course.  Pertinent  Medical History   Past Medical History:  Diagnosis Date   CHF (congestive heart failure) (HCC)    EF 45% in 2021   Chronic kidney disease    COPD (chronic obstructive pulmonary disease) ([place]) 02/16/2020   Hyperlipidemia    Hypertension    Pneumonia 2021    Micro Data:  2/14: Tracheal aspirate>> normal respiratory [place] 2/14: Blood culture x2>> negative 2/14: MRSA PCR>>negative 2/14: Strep pneumo & Legionella urinary antigens>> negative  Antimicrobials:   Anti-infectives (From admission, onward)    Start     Dose/Rate Route Frequency Ordered Stop   01/31/23 1700  ceFEPIme (MAXIPIME) 2 g in sodium chloride 0.9 % 100 mL IVPB        2 g 200 mL/hr over 30 Minutes Intravenous Every 12 hours 01/31/23 1555     01/30/23 1700  vancomycin (VANCOREADY) IVPB 1250 mg/250 mL  Status:  Discontinued        1,250 mg 166.7 mL/hr over 90 Minutes Intravenous Every 24 hours 01/29/23 1543 01/30/23 1036   01/30/23 1300  Ampicillin-Sulbactam (UNASYN) 3 g in sodium chloride 0.9 % 100 mL IVPB  Status:  Discontinued        3 g 200 mL/hr over 30 Minutes Intravenous Every 8 hours 01/30/23 1036 01/31/23 1118   01/30/23 0000  vancomycin variable dose per unstable renal function (pharmacist dosing)  Status:  Discontinued         Does not apply See admin instructions 01/29/23 1125 01/29/23 1548   01/29/23 2200  piperacillin-tazobactam (ZOSYN) IVPB 3.375  g  Status:  Discontinued        3.375 g 12.5 mL/hr over 240 Minutes Intravenous Every 6 hours 01/29/23 1547 01/29/23 1548   01/29/23 2200  piperacillin-tazobactam (ZOSYN) IVPB 3.375 g  Status:  Discontinued        3.375 g 100 mL/hr over 30 Minutes Intravenous Every 6 hours 01/29/23  1548 01/30/23 1036   01/29/23 2100  piperacillin-tazobactam (ZOSYN) IVPB 2.25 g  Status:  Discontinued        2.25 g 100 mL/hr over 30 Minutes Intravenous Every 8 hours 01/29/23 1125 01/29/23 1547   01/29/23 1115  piperacillin-tazobactam (ZOSYN) IVPB 3.375 g        3.375 g 100 mL/hr over 30 Minutes Intravenous  Once 01/29/23 1103 01/29/23 1457   01/29/23 1115  vancomycin (VANCOREADY) IVPB 2000 mg/400 mL        2,000 mg 200 mL/hr over 120 Minutes Intravenous  Once 01/29/23 1104 01/29/23 1656        Significant Hospital Events: Including procedures, antibiotic start and stop dates in addition to other pertinent events   01/30/23- I met with girlfriend of patient today to review short term medical plan.  He had Echo whith RV dysfunction and concern for PE.  We have started CRRT yesterday with additional interval improvement. He will have CTPE today.  He is weaned down on pressors today on vasor 0.4 and 4 of levophed reduced from 36 this morning.  01/31/23- remains agitated intermittently overnight. Continued issues with clot burden through CRRT circuit. 02/01/23- remains agitated overnight. Requiring numerous ativan pushes. Attempting to wean down sedation. Failed SBT miserably. 02/02/23- issues with CRRT overnight. Will attempt higher UF today. Assess for possible chest tube placement.  02/03/23- No issues with CRRT overnight, currently running without complication.  CT Head obtained overnight and negative for acute process.  Off Precedex this morning, currently somnolent, mental status currently precluding extubation. Will perform SBT as mental status permits.  Stop Cefepime, checking ammonia.  Interim History / Subjective:  -No significant events noted overnight -CT Head obtained overnight ~ negative for acute intracranial abnormality -Afebrile, hemodynamically stable, no vasopressors -Remains on CRRT and oliguric ~ UOP 145 cc last 24 hrs (net - 5L) -On minimal vent settings, but mental status  (currently somnolent) is currently precluding extubation ~ will perform SBT as able -Platelets stable at 38 from 32 ~ no overt bleeding noted, Hgb remains stable at 13.2 ~ Heparin gtt remains off -New Leukocytosis of 12.6 from 8.9 ~ cultures all negative, Discussed with Dr. Genia Harold, will stop Cefepime and monitor off ABX  Objective   Blood pressure (!) 94/58, pulse (!) 183, temperature 98.6 F (37 C), resp. rate 17, height 5' 11"$  (1.803 m), weight 93.7 kg, SpO2 95 %.    Vent Mode: PRVC FiO2 (%):  [40 %] 40 % Set Rate:  [16 bmp] 16 bmp Vt Set:  [500 mL] 500 mL PEEP:  [5 cmH20] 5 cmH20 Plateau Pressure:  [18 cmH20] 18 cmH20   Intake/Output Summary (Last 24 hours) at 02/03/2023 [id number] Last data filed at 02/03/2023 0700 Gross per 24 hour  Intake 3810.91 ml  Output 5901 ml  Net -2090.09 ml   Filed Weights   02/01/23 0500 02/02/23 0410 02/03/23 0500  Weight: 98 kg 95.9 kg 93.7 kg    Examination: General: Acute on chronically ill-appearing male, laying in bed, intubated and sedated, no acute distress HENT: Atraumatic, normocephalic, neck supple, no JVD Lungs: Coarse breath sounds throughout, occasionally overbreathing the vent, even,  nonlabored Cardiovascular: Regular rate and rhythm, S1-S2, no murmurs, rubs, gallops Abdomen: Soft, nontender, nondistended, no guarding rebound tenderness, bowel sounds positive x 4 Extremities: Normal bulk and tone, no deformities, chronic stasis dermatitis with 2+ edema bilaterally Neuro: Somnolent, Precedex currently off, withdraws and coughs from pain, currently not following commands, pupils PERRLA GU: Foley catheter in place draining scant amount of urine  Resolved Hospital Problem list     Assessment & Plan:   #Acute Hypoxic Respiratory Failure in setting of Pulmonary Edema, Pulmonary Hypertension, & small bilateral Pleural Effusions PMHx: COPD CTA Chest 01/30/23: negative for PE, Enlarged central pulmonary arteries compatible with pulmonary  arterial hypertension, Small to moderate-sized right pleural effusion and small left pleural effusion. -Full vent support, implement lung protective strategies -Plateau pressures less than 30 cm H20 -Wean FiO2 & PEEP as tolerated to maintain O2 sats >92% -Follow intermittent Chest X-ray & ABG as needed -Spontaneous Breathing Trials when respiratory parameters met and mental status permits -Implement VAP Bundle -Prn Bronchodilators -Volume removal with CRRT/HD  #Shock: ? Cardiogenic vs ? Septic  #Acute Decompensated HFpEF & Right sided HF #Moderate Tricuspid & Aortic Regurgitation #Elevated Troponin in setting of NSTEMI vs [place] ischemia Echocardiogram 01/30/23: LVEF 50055%, Grade I DD, severely reduced RV systolic function, mild to moderate TR & AR -Continuous cardiac monitoring -Maintain MAP >65 -Vasopressors as needed to maintain MAP goal  -Lactic acid is normalized -HS Troponin peaked at 2351 -Will likely need Cardiology consult -Volume removal with CRRT  #AKI on CKD Stage IV #Hypokalemia -Monitor I&O's / urinary output -Follow BMP -Ensure adequate renal perfusion -Avoid nephrotoxic agents as able -Replace electrolytes as indicated ~ Pharmacy following for assistance -Nephrology following, appreciate input ~ CRRT/HD as per Nephrology  #Thrombocytopenia, suspect due to Uremia superimposed on chronic thrombocytopenia #Concern for possible HIT -Monitor for S/Sx of bleeding -Trend CBC -SCD's for VTE Prophylaxis (Heparin gtt on hold) -Transfuse for Hgb <7 -Transfuse Platelets for PLT count <50K with active bleeding -HIT panel pending ~ HIT Ab only slightly elevated at 0.446 ~ discussed with Pharmacy, this result is not high enough for positive HIT  #Leukocytosis, previous fevers -Monitor fever curve -Trend WBC's & Procalcitonin -Follow cultures as above -Will stop empiric Cefepime pending cultures & sensitivities (has completed 6 days of empiric ABX) ~ discussed with Dr.  Genia Harold 2/19, will stop Cefepime and monitor off ABX as all cultures and imaging all negative to date  #Acute Metabolic Encephalopathy #Sedation needs in setting of mechanical ventilation CT Head negative for acute intracranial abnormality -Treatment of metabolic derangements as outlined above -Maintain a RASS goal of 0 to -1 -Precedex as needed to maintain RASS goal -Avoid sedating medications as able -Daily wake up assessment -Check Ammonia & ABG,  will stop Cefepime     Pt is critically ill superimposed on multiple chronic co-morbidities.  Prognosis is guarded, high risk for further decompensation, cardiac arrest, and death.  Recommend DNR status.   Best Practice (right click and "Reselect all SmartList Selections" daily)   Diet/type: tubefeeds and NPO DVT prophylaxis: SCD GI prophylaxis: PPI Lines: Dialysis Catheter, Arterial Line, and yes and it is still needed Foley:  Yes, and it is still needed Code Status:  full code Last date of multidisciplinary goals of care discussion [02/03/23]  2/19: Will update pt's family when they arrive at bedside  Labs   CBC: Recent Labs  Lab 01/29/23 0702 01/29/23 2227 01/30/23 0455 01/31/23 0358 02/01/23 0500 02/02/23 0413 02/03/23 0520  WBC 8.6   < >  11.4* 9.4 7.8 8.9 12.6*  NEUTROABS 6.8  --   --   --   --   --   --   HGB 18.3*   < > 17.5* 16.5 14.5 14.0 13.2  HCT 59.8*   < > 56.0* 51.3 46.2 45.6 42.7  MCV 106.8*   < > 104.7* 103.2* 103.1* 104.8* 104.4*  PLT 80*   < > 78* 44* 32* 32* 38*   < > = values in this interval not displayed.    Basic Metabolic Panel: Recent Labs  Lab 01/30/23 0455 01/30/23 1612 01/31/23 0358 01/31/23 1114 02/01/23 0500 02/01/23 1702 02/02/23 0413 02/02/23 1605 02/03/23 0520  NA 137   < > 136   < > 133* 134* 135 133* 136  K 4.7   < > 4.2   < > 3.5 3.6 3.7 3.4* 2.9*  CL 96*   < > 101   < > 101 103 105 102 108  CO2 28   < > 24   < > 25 25 26 25 24  $ GLUCOSE 123*   < > 133*   < > 167* 224*  187* 135* 143*  BUN 107*   < > 62*   < > 44* 35* 35* 37* 43*  CREATININE 4.93*   < > 2.96*   < > 2.24* 1.78* 1.60* 1.57* 1.28*  CALCIUM 9.2   < > 9.4   < > 8.8* 9.2 8.8* 9.3 7.8*  MG 2.3  --  1.8  --  2.0  --  1.9  --  1.8  PHOS 6.1*   < > 4.5   < > 2.8 2.9 2.4* 2.5 1.8*   < > = values in this interval not displayed.   GFR: Estimated Creatinine Clearance: 56.5 mL/min (A) (by C-G formula based on SCr of 1.28 mg/dL (H)). Recent Labs  Lab 01/31/23 0358 02/01/23 0500 02/02/23 0413 02/03/23 0520  WBC 9.4 7.8 8.9 12.6*    Liver Function Tests: Recent Labs  Lab 01/29/23 0702 01/29/23 2046 01/30/23 1612 01/31/23 0358 02/01/23 0500 02/01/23 1702 02/02/23 0413 02/02/23 1605 02/03/23 0520  AST 36  --  23  --   --   --   --   --   --   ALT 13  --  12  --   --   --   --   --   --   ALKPHOS 75  --  50  --   --   --   --   --   --   BILITOT 2.1*  --  4.3*  --   --   --   --   --   --   PROT 7.1  --  5.8*  --   --   --   --   --   --   ALBUMIN 3.7   < > 3.0*  3.0*   < > 2.5* 2.7* 2.6* 3.1* 2.4*   < > = values in this interval not displayed.   No results for input(s): "LIPASE", "AMYLASE" in the last 168 hours. No results for input(s): "AMMONIA" in the last 168 hours.  ABG    Component Value Date/Time   PHART 7.29 (L) 01/30/2023 0455   PCO2ART 61 (H) 01/30/2023 0455   PO2ART 72 (L) 01/30/2023 0455   HCO3 29.3 (H) 01/30/2023 0455   TCO2 31 02/15/2020 1034   O2SAT 95.1 01/30/2023 0455     Coagulation Profile: Recent Labs  Lab 01/29/23 1108  INR 1.3*    Cardiac Enzymes: No results for input(s): "CKTOTAL", "CKMB", "CKMBINDEX", "TROPONINI" in the last 168 hours.  HbA1C: Hgb A1c MFr Bld  Date/Time Value Ref Range Status  10/02/2022 11:16 PM 6.3 (H) 4.8 - 5.6 % Final    Comment:    (NOTE) Pre diabetes:          5.7%-6.4%  Diabetes:              >6.4%  Glycemic control for   <7.0% adults with diabetes     CBG: Recent Labs  Lab 02/02/23 1307 02/02/23 1555  02/02/23 1924 02/02/23 2328 02/03/23 0539  GLUCAP 139* 129* 168* 166* 165*    Review of Systems:   Unable to assess due to AMS/intubation/sedation/Critical Illness  Past Medical History:  He,  has a past medical history of CHF (congestive heart failure) ([place]), Chronic kidney disease, COPD (chronic obstructive pulmonary disease) ([place]) (02/16/2020), Hyperlipidemia, Hypertension, and Pneumonia (2021).   Surgical History:   Past Surgical History:  Procedure Laterality Date   CATARACT EXTRACTION W/PHACO Left 10/31/2021   Procedure: CATARACT EXTRACTION PHACO AND INTRAOCULAR LENS PLACEMENT ([place]) LEFT VISION BLUE 3.45 00:48.0;  Surgeon: Leandrew Koyanagi, MD;  Location: [facility];  Service: Ophthalmology;  Laterality: Left;   CATARACT EXTRACTION W/PHACO Right 11/14/2021   Procedure: CATARACT EXTRACTION PHACO AND INTRAOCULAR LENS PLACEMENT ([facility]) RIGHT 8.59 01:10.5;  Surgeon: Leandrew Koyanagi, MD;  Location: [facility];  Service: Ophthalmology;  Laterality: Right;   INGUINAL HERNIA REPAIR Right 02/04/2017   Procedure: HERNIA REPAIR INGUINAL ADULT;  Surgeon: Leonie Green, MD;  Location: [facility];  Service: General;  Laterality: Right;   NO PAST SURGERIES     QUADRICEPS TENDON REPAIR Left 02/15/2020   Procedure: REPAIR QUADRICEP TENDON;  Surgeon: Corky Mull, MD;  Location: [facility];  Service: Orthopedics;  Laterality: Left;     Social History:   reports that he quit smoking about 7 years ago. His smoking use included cigarettes. He has a 13.25 pack-year smoking history. He has never used smokeless tobacco. He reports current alcohol use. He reports that he does not use drugs.   Family History:  His family history includes Diabetes in his brother, maternal grandmother, mother, and paternal grandmother.   Allergies No Known Allergies   Home Medications  Prior to Admission medications   Medication Sig Start Date End Date Taking? Authorizing Provider  aspirin  EC 81 MG tablet Take 1 tablet (81 mg total) by mouth daily. Swallow whole. 03/28/22  Yes Hackney, Otila Kluver A, FNP  calcitRIOL (ROCALTROL) 0.25 MCG capsule Take 0.25 mcg by mouth daily.   Yes [provider]  cephALEXin (KEFLEX) 500 MG capsule Take 500 mg by mouth 3 (three) times daily.   Yes [provider]  gabapentin (NEURONTIN) 100 MG capsule Take 1 capsule (100 mg total) by mouth 2 (two) times daily. 10/15/22  Yes [place] Eric J, DO  ipratropium-albuterol (DUONEB) 0.5-2.5 (3) MG/3ML SOLN Inhale 3 mLs into the lungs every 6 (six) hours as needed (wheezing).   Yes [provider]  losartan (COZAAR) 50 MG tablet Take 50 mg by mouth daily. 12/12/22  Yes [provider]  metolazone (ZAROXOLYN) 2.5 MG tablet Take 1 tablet (2.5 mg total) by mouth 2 (two) times a week. 10/15/22  Yes [place] Eric J, DO  midodrine (PROAMATINE) 2.5 MG tablet Take 2.5 mg by mouth 3 (three) times daily. 01/01/23  Yes [provider]  omeprazole (PRILOSEC) 20 MG capsule Take 20  mg by mouth daily. 11/04/22 11/04/23 Yes [provider]  potassium chloride SA (KLOR-CON M) 20 MEQ tablet Take 1 tablet (20 mEq total) by mouth daily. With extra 75mq on M/ F with metolazone 01/28/23  Yes HDarylene PriceA, FNP  torsemide 60 MG TABS Take 60 mg by mouth daily. 11/27/22  Yes SSidney Ace MD     Critical care time: 40 minutes     JDarel Hong AGACNP-BC [facility] Prefer epic messenger for cross cover needs If after hours, please call E-link

## 2023-02-03 NOTE — Progress Notes (Signed)
Waiting arrival of heparin syringe from pharmacy to continue set up of new CRRT cartridge/filter. Called pharmacy twice and spoke with Barbaraann Rondo, Rph and syringe should be her shortly.

## 2023-02-03 NOTE — Progress Notes (Signed)
Nutrition Follow Up Note   DOCUMENTATION CODES:   Not applicable  INTERVENTION:   Continue Vital 1.2@60ml$ /hr + ProSource TF 20- Give 52m BID via tube  Free water flushes 373mq4 hours to maintain tube patency   Regimen provides 1888kcal/day, 148g/day protein 134850may of free water.   Rena-vit daily via tube   Juven Fruit Punch BID via tube, each serving provides 95kcal and 2.5g of protein (amino acids glutamine and arginine)  Daily weights   NUTRITION DIAGNOSIS:   Inadequate oral intake related to inability to eat (pt sedated and ventilated) as evidenced by NPO status.  GOAL:   Provide needs based on ASPEN/SCCM guidelines -met   MONITOR:   Vent status, Labs, Weight trends, TF tolerance, I & O's, Skin  ASSESSMENT:   [age] male with h/o COPD, CHF, HTN, CKD IV, aortic aneurysm and HLD who is admitted with circulatory shock, CHF, encephalopaty and new ESRD. Pt with cirrrhosis noted on CT scan.  Pt remains sedated and ventilated. OGT in place. Pt tolerating tube feeds well at goal rate. Pt is refeeding; electrolytes being monitored and supplemented. Pt remains on CRRT. Per chart, pt is back to his UBW. Pt -6.0L on his I & Os.  No BM since admission; plan is for lactulose today. Pt with continued AMS.  Medications reviewed and include: aspirin, colace, folic acid, solu-cortef, rena-vit, juven, protonix, thiamine, heparin, levophed, Kphos   Labs reviewed: K 2.9(L), BUN 43(H), creat 1.28(H), P 1.8(L), Mg 1.8 wnl Ammonia- 45(H)- 2/19 BNP- 1706.8(H) Wbc- 14.0(H) Cbgs- 141, 165 x 24 hrs  Patient is currently intubated on ventilator support MV: 10.8 L/min Temp (24hrs), Avg:97.7 F (36.5 C), Min:96.6 F (35.9 C), Max:98.8 F (37.1 C)  Propofol: none   MAP- >58m3m  UOP- 145ml74miet Order:   Diet Order             Diet NPO time specified  Diet effective now                  EDUCATION NEEDS:   No education needs have been identified at this  time  Skin:  Skin Assessment: Reviewed RN Assessment (NPI L leg)  Last BM:  pta  Height:   Ht Readings from Last 1 Encounters:  01/29/23 5' 11"$  (1.803 m)    Weight:   Wt Readings from Last 1 Encounters:  02/03/23 93.7 kg    Ideal Body Weight:  78 kg  BMI:  Body mass index is 28.81 kg/m.  Estimated Nutritional Needs:   Kcal:  1806kcal/day  Protein:  130-150g/day  Fluid:  2.0L/day  CaseyKoleen DistanceRD, LDN Please refer to [facility] CenterRD and/or RD on-call/weekend/after hours pager

## 2023-02-03 NOTE — Progress Notes (Signed)
Notified Dr. Genia Harold and NP Dewaine Conger that patient is gagging against ET tube and vomited large amount. MD gave order to hold tube feeds. NP Dewaine Conger came to bedside to assess patient and spoke with patients significant other [name]. Tube feeds stopped. No further orders given at this time.

## 2023-02-03 NOTE — Progress Notes (Signed)
[name]  ROUNDING NOTE   Subjective:   Mr. Javyn Pollack was admitted to [facility] on 01/29/2023 for Peripheral edema [R60.9] Shock circulatory ([place]) [R57.9] Acute on chronic congestive heart failure, unspecified heart failure type ([place]) [I50.9] Acute renal failure superimposed on chronic kidney disease, unspecified acute renal failure type, unspecified CKD stage ([place]) [N17.9, N18.9]  Remains on CRRT. UF 3420, net -1822 UOP 150m  Remains on norepinephrine gtt. 942m/min when examined.   Objective:  Vital signs in last 24 hours:  Temp:  [96.6 F (35.9 C)-98.8 F (37.1 C)] 97.7 F (36.5 C) (02/19 1015) Pulse Rate:  [32-183] 84 (02/19 1000) Resp:  [13-27] 23 (02/19 1015) BP: (84-121)/(52-86) 98/72 (02/19 1000) SpO2:  [93 %-100 %] 97 % (02/19 1000) Arterial Line BP: (88-132)/(50-73) 112/63 (02/19 1015) FiO2 (%):  [40 %] 40 % (02/19 0800) Weight:  [93.7 kg] 93.7 kg (02/19 0500)  Weight change: -2.2 kg Filed Weights   02/01/23 0500 02/02/23 0410 02/03/23 0500  Weight: 98 kg 95.9 kg 93.7 kg    Intake/Output: I/O last 3 completed shifts: In: 4764.9 [I.V.:1278.9; NG/GT:3030; IV Piggyback:456] Out: [id number]:245]   Intake/Output this shift:  Total I/O In: 236 [I.V.:26; NG/GT:210] Out: 571 [Urine:31]  Physical Exam: General: Critically ill  Head: ETT  Eyes: Anicteric  Neck: Supple  Lungs:  Pressure Support FiO2 40%  Heart: regular  Abdomen:  Soft, +bs present  Extremities: Trace dependent edema.    Neurologic: Intubated and sedated  Skin: Left lower leg wound in clean and dry dressing  Access: Left femoral Temp HD catheter placed 2/14.     Basic Metabolic Panel: Recent Labs  Lab 01/30/23 0455 01/30/23 1612 01/31/23 0358 01/31/23 1114 02/01/23 0500 02/01/23 1702 02/02/23 0413 02/02/23 1605 02/03/23 0520  NA 137   < > 136   < > 133* 134* 135 133* 136  K 4.7   < > 4.2   < > 3.5 3.6 3.7 3.4* 2.9*  CL 96*   < > 101   < > 101 103 105 102 108   CO2 28   < > 24   < > 25 25 26 25 24  $ GLUCOSE 123*   < > 133*   < > 167* 224* 187* 135* 143*  BUN 107*   < > 62*   < > 44* 35* 35* 37* 43*  CREATININE 4.93*   < > 2.96*   < > 2.24* 1.78* 1.60* 1.57* 1.28*  CALCIUM 9.2   < > 9.4   < > 8.8* 9.2 8.8* 9.3 7.8*  MG 2.3  --  1.8  --  2.0  --  1.9  --  1.8  PHOS 6.1*   < > 4.5   < > 2.8 2.9 2.4* 2.5 1.8*   < > = values in this interval not displayed.     Liver Function Tests: Recent Labs  Lab 01/29/23 0702 01/29/23 2046 01/30/23 1612 01/31/23 0358 02/01/23 0500 02/01/23 1702 02/02/23 0413 02/02/23 1605 02/03/23 0520  AST 36  --  23  --   --   --   --   --   --   ALT 13  --  12  --   --   --   --   --   --   ALKPHOS 75  --  50  --   --   --   --   --   --   BILITOT 2.1*  --  4.3*  --   --   --   --   --   --  PROT 7.1  --  5.8*  --   --   --   --   --   --   ALBUMIN 3.7   < > 3.0*  3.0*   < > 2.5* 2.7* 2.6* 3.1* 2.4*   < > = values in this interval not displayed.    No results for input(s): "LIPASE", "AMYLASE" in the last 168 hours. Recent Labs  Lab 02/03/23 0956  AMMONIA 45*    CBC: Recent Labs  Lab 01/29/23 0702 01/29/23 2227 01/31/23 0358 02/01/23 0500 02/02/23 0413 02/03/23 0520 02/03/23 0956  WBC 8.6   < > 9.4 7.8 8.9 12.6* 14.0*  NEUTROABS 6.8  --   --   --   --   --  11.9*  HGB 18.3*   < > 16.5 14.5 14.0 13.2 14.6  HCT 59.8*   < > 51.3 46.2 45.6 42.7 46.4  MCV 106.8*   < > 103.2* 103.1* 104.8* 104.4* 103.1*  PLT 80*   < > 44* 32* 32* 38* 38*   < > = values in this interval not displayed.     Cardiac Enzymes: No results for input(s): "CKTOTAL", "CKMB", "CKMBINDEX", "TROPONINI" in the last 168 hours.  BNP: Invalid input(s): "POCBNP"  CBG: Recent Labs  Lab 02/02/23 1555 02/02/23 1924 02/02/23 2328 02/03/23 0539 02/03/23 0742  GLUCAP 129* 168* 166* 165* 141*     Microbiology: Results for orders placed or performed during the hospital encounter of 01/29/23  Culture, Respiratory w Gram Stain      Status: None   Collection Time: 01/29/23  3:45 AM   Specimen: Tracheal Aspirate; Respiratory  Result Value Ref Range Status   Specimen Description   Final    TRACHEAL ASPIRATE Performed at [facility], [address], [place], [place] [address]    Special Requests   Final    NONE Performed at [facility], [address] [place], [place] [address]    Gram Stain   Final    MODERATE WBC PRESENT,BOTH PMN AND MONONUCLEAR NO ORGANISMS SEEN    Culture   Final    RARE Normal respiratory flora-no Staph aureus or Pseudomonas seen Performed at [facility] [address], [place], [place] [address]    Report Status 02/01/2023 FINAL  Final  Culture, blood (Routine X 2) w Reflex to ID Panel     Status: None   Collection Time: 01/29/23  1:10 PM   Specimen: BLOOD  Result Value Ref Range Status   Specimen Description BLOOD BLOOD LEFT ARM  Final   Special Requests   Final    BOTTLES DRAWN AEROBIC AND ANAEROBIC Blood Culture adequate volume   Culture   Final    NO GROWTH 5 DAYS Performed at [facility], [address], [place], [place] [address]    Report Status 02/03/2023 FINAL  Final  MRSA Next Gen by PCR, Nasal     Status: None   Collection Time: 01/29/23  1:47 PM   Specimen: Nasal Mucosa; Nasal Swab  Result Value Ref Range Status   MRSA by PCR Next Gen NOT DETECTED NOT DETECTED Final    Comment: (NOTE) The GeneXpert MRSA Assay (FDA approved for NASAL specimens only), is one component of a comprehensive MRSA colonization surveillance program. It is not intended to diagnose MRSA infection nor to guide or monitor treatment for MRSA infections. Test performance is not FDA approved in patients less than 68 years old. Performed at [facility], [phone] [place], [place] [address]   Culture, blood (Routine X 2) w Reflex to ID Panel     Status: None   Collection Time: 01/29/23  2:10 PM   Specimen: BLOOD  Result Value Ref Range  Status   Specimen Description BLOOD A-LINE  Final   Special Requests   Final    BOTTLES DRAWN AEROBIC AND ANAEROBIC Blood Culture adequate volume   Culture   Final    NO GROWTH 5 DAYS Performed at [facility], [address] [place], [place] [address]    Report Status 02/03/2023 FINAL  Final    Coagulation Studies: No results for input(s): "LABPROT", "INR" in the last 72 hours.   Urinalysis: No results for input(s): "COLORURINE", "LABSPEC", "PHURINE", "GLUCOSEU", "HGBUR", "BILIRUBINUR", "KETONESUR", "PROTEINUR", "UROBILINOGEN", "NITRITE", "LEUKOCYTESUR" in the last 72 hours.  Invalid input(s): "APPERANCEUR"     Imaging: CT HEAD WO CONTRAST (5MM)  Result Date: 02/03/2023 CLINICAL DATA:  Altered mental status EXAM: CT HEAD WITHOUT CONTRAST TECHNIQUE: Contiguous axial images were obtained from the base of the skull through the vertex without intravenous contrast. RADIATION DOSE REDUCTION: This exam was performed according to the departmental dose-optimization program which includes automated exposure control, adjustment of the mA and/or kV according to patient size and/or use of iterative reconstruction technique. COMPARISON:  None Available. FINDINGS: Brain: No evidence of acute infarction, hemorrhage, mass, mass effect, or midline shift. No hydrocephalus or extra-axial fluid collection. Partial empty sella. Normal craniocervical junction. Cerebral volume is within normal limits for age. Vascular: No hyperdense vessel. Skull: Negative for fracture or focal lesion. Sinuses/Orbits: Mucosal thickening in the left greater than right maxillary sinus. Other: Underpneumatized right mastoid, with trace fluid. The left mastoid air cells are well aerated. Endotracheal and orogastric tubes noted. IMPRESSION: No acute intracranial process. Electronically Signed   By: Merilyn Baba M.D.   On: 02/03/2023 03:03   DG Chest Port 1 View  Result Date: 02/01/2023 CLINICAL DATA:  Dyspnea EXAM:  PORTABLE CHEST 1 VIEW COMPARISON:  01/29/2023 FINDINGS: There is pulmonary vascular congestion. Bibasilar consolidation or volume loss. No pneumothorax. Possible small pleural effusions. Endotracheal tube tip 3 cm above carina. NG tube tip below the diaphragm and off x-ray. Mediastinal drain or probe overlies the mid thorax. IMPRESSION: Vascular congestion.  Bibasilar consolidation or volume loss. Electronically Signed   By: Sammie Bench M.D.   On: 02/01/2023 13:56     Medications:     prismasol BGK 4/2.5 400 mL/hr at 02/03/23 1004    prismasol BGK 4/2.5 400 mL/hr at 02/03/23 1006   sodium chloride 5 mL/hr at 02/02/23 0722   ceFEPime (MAXIPIME) IV Stopped (02/03/23 0548)   feeding supplement (VITAL AF 1.2 CAL) 1,000 mL (02/03/23 0100)   heparin 10,000 units/ 20 mL infusion syringe 500 Units/hr (02/03/23 0900)   norepinephrine (LEVOPHED) Adult infusion 7 mcg/min (02/03/23 1000)   potassium PHOSPHATE IVPB (in mmol)     prismasol BGK 4/2.5 1,500 mL/hr at 02/03/23 0959    aspirin  81 mg Per Tube Daily   Chlorhexidine Gluconate Cloth  6 each Topical Daily   docusate  100 mg Per Tube BID   feeding supplement (PROSource TF20)  60 mL Per Tube BID   folic acid  1 mg Per Tube Daily   free water  30 mL Per Tube Q4H   hydrocortisone sod succinate (SOLU-CORTEF) inj  100 mg Intravenous Q12H   multivitamin  1 tablet Per Tube QHS   nutrition supplement (JUVEN)  1 packet  Per Tube BID BM   mouth rinse  15 mL Mouth Rinse Q2H   pantoprazole (PROTONIX) IV  40 mg Intravenous Daily   polyethylene glycol  17 g Per Tube Daily   thiamine (VITAMIN B1) injection  100 mg Intravenous Daily   docusate sodium, heparin, mouth rinse, polyethylene glycol, sodium chloride  Assessment/ Plan:  Mr. Randell Bessett is a 77 y.o. black male with chronic diastolic congestive heart failure, hypertension, COPD, hyperlipidemia, who is admitted to [facility] on 01/29/2023 for Peripheral edema [R60.9] Shock circulatory ([place])  [R57.9] Acute on chronic congestive heart failure, unspecified heart failure type ([place]) [I50.9] Acute renal failure superimposed on chronic kidney disease, unspecified acute renal failure type, unspecified CKD stage ([place]) [N17.9, N18.9]  Acute Kidney Injury on chronic kidney disease stage IV: baseline creatinine of 2.67, GFR of 24 on 01/06/23. History of bland urine. Acute kidney injury most likely secondary to cardiorenal syndrome. Chronic kidney disease secondary to hypertension.  Continue CRRT   Acute exacerbation of chronic diastolic congestive heart failure and acute respiratory failure requiring intubation and mechanical ventilation.  Volume status improved.     Hypotension with cardiogenic shock requiring vasopressors: Patient remains on norepinephrine.   LOS: 5 Alaysia Lightle 2/19/202410:29 AM

## 2023-02-03 NOTE — Progress Notes (Signed)
Patient ID: [name], male   DOB: 05-19-1946, 77 y.o.   MRN: [id number]    Progress Note from the Palliative Medicine Team at [facility]   Patient Name: John [name]        Date: 02/03/2023 DOB: 1946-08-06  Age: 77 y.o. MRN#: [id number] Attending Physician: Armando Reichert, MD Primary Care Physician: Alisa Graff, FNP Admit Date: 01/29/2023   Medical records reviewed, assessed patient and spoke to treatment team    This is 77 year old male with a history of essential hypertension, on hydralazine and Cozaar, stage IV CKD but has not been on dialysis in the past, has chronic diastolic and systolic heart failure with cor pulmonale, moderate tricuspid regurgitation and aortic valve regurgitation, history of a ascending aortic aneurysm, coronary artery disease with atherosclerosis, COPD and chronic hypoxemia baseline 2 L/min supplemental oxygen at home with recurrent episodes of hypoxemia and remote acute exacerbation of COPD with hypoxemia and hypercapnia 2 months ago history of ruptured left quadricep tendon 2 years ago, chronic thrombocytopenia and erythrocytosis recurrent lower extremity cellulitis, recurrent hyperkalemia, morbid obesity history of anasarca dyslipidemia, recent admission for acute CHF exacerbation requiring Lasix drip and BiPAP support, who was brought in by EMS due to worsening altered mental status and circulatory shock.   Today is day 5 of this hospitalization  This is 77 year old male with a history of essential hypertension, on hydralazine and Cozaar, stage IV CKD but has not been on dialysis in the past, has chronic diastolic and systolic heart failure with cor pulmonale, moderate tricuspid regurgitation and aortic valve regurgitation, history of a ascending aortic aneurysm, coronary artery disease with atherosclerosis, COPD and chronic hypoxemia baseline 2 L/min supplemental oxygen at home with recurrent episodes of hypoxemia and remote acute exacerbation of COPD  with hypoxemia and hypercapnia 2 months ago history of ruptured left quadricep tendon 2 years ago, chronic thrombocytopenia and erythrocytosis recurrent lower extremity cellulitis, recurrent hyperkalemia, morbid obesity history of anasarca dyslipidemia, recent admission for acute CHF exacerbation requiring Lasix drip and BiPAP support, who was brought in by EMS due to worsening altered mental status and circulatory shock.   Initial palliative consult 01/29/2023  Spoke to transition of care team/Eric Anterhaus LCSW --at this time there is no medical decision maker for this patient.  And no advance care planning documents.  His significant other declines responsibility for decisions.  Recommend due diligence and investigating next of kin.  Attempted to speak with [name] Lambert/significant other by phone.  Left voicemail await callback  35 minutes-greater than 50% of the time was spent in counseling coordination of care   Wadie Lessen NP  Palliative Medicine Team Team Phone # [phone] Pager [phone]

## 2023-02-03 NOTE — Progress Notes (Signed)
Pharmacy Antibiotic Note  John [name] is a 77 y.o. male admitted on 01/29/2023 with sepsis, respiatory failure with pleural effusions, acute decompensated heart failure and renal failure on CRRT. New fevers on 2/16 PM. Pharmacy has been consulted for cefepime dosing.   On CRRT with Effluent rate 25 mL/kg/hr.  Plan: Day # 6 of antibiotics (Day #3 since new fever) Continue cefepime 2 g IV q12H (CRRT dosing) Follow up culture results to assess for antibiotic optimization Monitor renal function to assess for any necessary antibiotic dosing changes  Height: 5' 11"$  (180.3 cm) Weight: 93.7 kg (206 lb 9.1 oz) IBW/kg (Calculated) : 75.3  Temp (24hrs), Avg:97.5 F (36.4 C), Min:96.6 F (35.9 C), Max:98.8 F (37.1 C)  Recent Labs  Lab 01/30/23 0455 01/30/23 1612 01/31/23 0358 01/31/23 1114 02/01/23 0500 02/01/23 1702 02/02/23 0413 02/02/23 1605 02/03/23 0520  WBC 11.4*  --  9.4  --  7.8  --  8.9  --  12.6*  CREATININE 4.93*   < > 2.96*   < > 2.24* 1.78* 1.60* 1.57* 1.28*   < > = values in this interval not displayed.     Estimated Creatinine Clearance: 56.5 mL/min (A) (by C-G formula based on SCr of 1.28 mg/dL (H)).    No Known Allergies  Antimicrobials this admission: vancomycin 2/14 >> 2/15 Zosyn 2/14 >> 2/15 Unasyn 2/15 >> 2/16 Cefepime 2/16 >>  Dose adjustments this admission: N/a  Microbiology results: 2/14 BCx: NGF 2/14 MRSA PCR: Not detected 2/16 Tracheal aspirate: NL resp flora  Thank you for allowing pharmacy to be a part of this patient's care.  Will M. Ouida Sills, PharmD PGY-1 Pharmacy Resident 02/03/2023 8:58 AM

## 2023-02-04 ENCOUNTER — Inpatient Hospital Stay: Payer: Medicare HMO

## 2023-02-04 DIAGNOSIS — R579 Shock, unspecified: Secondary | ICD-10-CM | POA: Diagnosis not present

## 2023-02-04 DIAGNOSIS — G928 Other toxic encephalopathy: Secondary | ICD-10-CM | POA: Diagnosis not present

## 2023-02-04 DIAGNOSIS — I509 Heart failure, unspecified: Secondary | ICD-10-CM | POA: Diagnosis not present

## 2023-02-04 DIAGNOSIS — N179 Acute kidney failure, unspecified: Secondary | ICD-10-CM | POA: Diagnosis not present

## 2023-02-04 LAB — RENAL FUNCTION PANEL
Albumin: 2.7 g/dL — ABNORMAL LOW (ref 3.5–5.0)
Anion gap: 6 (ref 5–15)
BUN: 50 mg/dL — ABNORMAL HIGH (ref 8–23)
CO2: 26 mmol/L (ref 22–32)
Calcium: 8.8 mg/dL — ABNORMAL LOW (ref 8.9–10.3)
Chloride: 103 mmol/L (ref 98–111)
Creatinine, Ser: 1.75 mg/dL — ABNORMAL HIGH (ref 0.61–1.24)
GFR, Estimated: 40 mL/min — ABNORMAL LOW (ref >60)
Glucose, Bld: 168 mg/dL — ABNORMAL HIGH (ref 70–99)
Phosphorus: 2.2 mg/dL — ABNORMAL LOW (ref 2.5–4.6)
Potassium: 3.5 mmol/L (ref 3.5–5.1)
Sodium: 135 mmol/L (ref 135–145)

## 2023-02-04 LAB — GLUCOSE, CAPILLARY
Glucose-Capillary: 158 mg/dL — ABNORMAL HIGH (ref 70–99)
Glucose-Capillary: 160 mg/dL — ABNORMAL HIGH (ref 70–99)
Glucose-Capillary: 107 mg/dL — ABNORMAL HIGH (ref 70–99)
Glucose-Capillary: 139 mg/dL — ABNORMAL HIGH (ref 70–99)

## 2023-02-04 LAB — APTT: aPTT: 32 seconds (ref 24–36)

## 2023-02-04 LAB — CBC
HCT: 43 % (ref 39.0–52.0)
Hemoglobin: 13.6 g/dL (ref 13.0–17.0)
MCH: 32.2 pg (ref 26.0–34.0)
MCHC: 31.6 g/dL (ref 30.0–36.0)
MCV: 101.7 fL — ABNORMAL HIGH (ref 80.0–100.0)
Platelets: 38 10*3/uL — ABNORMAL LOW (ref 150–400)
RBC: 4.23 MIL/uL (ref 4.22–5.81)
RDW: 15.7 % — ABNORMAL HIGH (ref 11.5–15.5)
WBC: 13.3 10*3/uL — ABNORMAL HIGH (ref 4.0–10.5)
nRBC: 0 % (ref 0.0–0.2)

## 2023-02-04 LAB — MAGNESIUM: Magnesium: 2.3 mg/dL (ref 1.7–2.4)

## 2023-02-04 MED ORDER — POTASSIUM & SODIUM PHOSPHATES 280-160-250 MG PO PACK
2.0000 | PACK | Freq: Once | ORAL | Status: AC
  Administered 2023-02-04: 2
  Filled 2023-02-04: qty 2

## 2023-02-04 NOTE — TOC Progression Note (Signed)
Transition of Care [facility]) - Progression Note    Patient Details  Name: John [name] MRN: [id number] Date of Birth: 1946-09-27  [facility] CM/SW Contact  Shelbie Hutching, RN Phone Number: 02/04/2023, 4:08 PM  Clinical Narrative:    APS report given to Otila Kluver at [place].  Patient does not have a decision maker and remains intubated and sedated in ICU.    Expected Discharge Plan:  (TBD) Barriers to Discharge: Continued Medical Work up  Expected Discharge Plan and Services   Discharge Planning Services: CM Consult   Living arrangements for the past 2 months: Single Family Home                 DME Arranged: N/A                     Social Determinants of Health (SDOH) Interventions SDOH Screenings   Food Insecurity: No Food Insecurity (01/29/2023)  Housing: Low Risk  (01/29/2023)  Transportation Needs: No Transportation Needs (01/29/2023)  Utilities: Not At Risk (01/29/2023)  Alcohol Screen: Low Risk  (07/17/2022)  Depression (PHQ2-9): Low Risk  (07/30/2022)  Financial Resource Strain: Low Risk  (07/17/2022)  Physical Activity: Sufficiently Active (07/17/2022)  Social Connections: Socially Integrated (07/17/2022)  Stress: No Stress Concern Present (07/17/2022)  Tobacco Use: Medium Risk (01/29/2023)    Readmission Risk Interventions    01/30/2023   11:09 AM  Readmission Risk Prevention Plan  Transportation Screening Complete  Medication Review ([place]) Complete  PCP or Specialist appointment within 3-5 days of discharge Complete  HRI or [place] Complete  SW Recovery Care/Counseling Consult Complete  Palliative Care Screening Complete  [place] Not Applicable

## 2023-02-04 NOTE — Consult Note (Signed)
   [facility] Inpatient Consult   02/04/2023  Al Lullo 1946/05/19 [id number]  [facility] ([place]) [place]  Primary Care Provider:Hackney, Aura Fey, FNP ([facility])  This is a remote review for a [facility] patient admitted to [facility].  Patient (pt) screened for hospitalization with noted high risk score for unplanned readmission risk and to assess for length of stay and showing active with [place] RN and previously assigned for care coordination needs.  Plan: Assigned [facility] RN care manager alerted via in-basket with a response of patient's hospitalization. Ongoing collaboration with [facility] team member.  For any inquires, please contact:  Raina Mina, RN, [place] [phone] Hours 8:30-5:00 pm M-F Fax Number: [phone] [place] Nurse Line [phone]

## 2023-02-04 NOTE — Progress Notes (Signed)
[name]  ROUNDING NOTE   Subjective:   [name] was admitted to [facility] on 01/29/2023 for Peripheral edema [R60.9] Shock circulatory ([place]) [R57.9] Acute on chronic congestive heart failure, unspecified heart failure type ([place]) [I50.9] Acute renal failure superimposed on chronic kidney disease, unspecified acute renal failure type, unspecified CKD stage ([place]) [N17.9, N18.9]  Taken off CRRT this morning.   Weaned off norepinephrine gtt.   UOP 84m  CRRT UF 1926, net -623  Objective:  Vital signs in last 24 hours:  Temp:  [95.4 F (35.2 C)-99.1 F (37.3 C)] 99.1 F (37.3 C) (02/20 1145) Pulse Rate:  [64-130] 93 (02/20 1145) Resp:  [10-34] 16 (02/20 1145) BP: (81-132)/(60-103) 102/72 (02/20 1145) SpO2:  [93 %-100 %] 100 % (02/20 1145) Arterial Line BP: (95-147)/(56-83) 110/64 (02/19 1700) FiO2 (%):  [40 %] 40 % (02/20 0800)  Weight change:  Filed Weights   02/01/23 0500 02/02/23 0410 02/03/23 0500  Weight: 98 kg 95.9 kg 93.7 kg    Intake/Output: I/O last 3 completed shifts: In: 3773.1 [I.V.:570.7; NG/GT:2375; IV Piggyback:827.4] Out: 7694.5 [Urine:341]   Intake/Output this shift:  Total I/O In: 65.1 [I.V.:5.1; NG/GT:60] Out: 244.5 [Urine:40]  Physical Exam: General: Critically ill  Head: ETT  Eyes: Anicteric  Neck: Supple  Lungs:  Pressure Support FiO2 40%  Heart: regular  Abdomen:  Soft  Extremities: + dependent edema.    Neurologic: Intubated and sedated  Skin: Left lower leg wound in clean and dry dressing  Access: Left femoral Temp HD catheter placed 2/14.     Basic Metabolic Panel: Recent Labs  Lab 01/31/23 0358 01/31/23 1114 02/01/23 0500 02/01/23 1702 02/02/23 0413 02/02/23 1605 02/03/23 0520 02/03/23 1735 02/04/23 0305 02/04/23 0400  NA 136   < > 133*   < > 135 133* 136 134* 135  --   K 4.2   < > 3.5   < > 3.7 3.4* 2.9* 4.0 3.5  --   CL 101   < > 101   < > 105 102 108 102 103  --   CO2 24   < > 25   < > 26 25 24  26 26  $ --   GLUCOSE 133*   < > 167*   < > 187* 135* 143* 153* 168*  --   BUN 62*   < > 44*   < > 35* 37* 43* 55* 50*  --   CREATININE 2.96*   < > 2.24*   < > 1.60* 1.57* 1.28* 1.87* 1.75*  --   CALCIUM 9.4   < > 8.8*   < > 8.8* 9.3 7.8* 9.0 8.8*  --   MG 1.8  --  2.0  --  1.9  --  1.8  --   --  2.3  PHOS 4.5   < > 2.8   < > 2.4* 2.5 1.8* 3.9 2.2*  --    < > = values in this interval not displayed.     Liver Function Tests: Recent Labs  Lab 01/29/23 0702 01/29/23 2046 01/30/23 1612 01/31/23 0358 02/02/23 0413 02/02/23 1605 02/03/23 0520 02/03/23 1735 02/04/23 0305  AST 36  --  23  --   --   --  17  --   --   ALT 13  --  12  --   --   --  10  --   --   ALKPHOS 75  --  50  --   --   --  50  --   --   BILITOT 2.1*  --  4.3*  --   --   --  1.6*  --   --   PROT 7.1  --  5.8*  --   --   --  5.0*  --   --   ALBUMIN 3.7   < > 3.0*  3.0*   < > 2.6* 3.1* 2.3*  2.4* 2.8* 2.7*   < > = values in this interval not displayed.    No results for input(s): "LIPASE", "AMYLASE" in the last 168 hours. Recent Labs  Lab 02/03/23 0956  AMMONIA 45*     CBC: Recent Labs  Lab 01/29/23 0702 01/29/23 2227 02/01/23 0500 02/02/23 0413 02/03/23 0520 02/03/23 0956 02/04/23 0400  WBC 8.6   < > 7.8 8.9 12.6* 14.0* 13.3*  NEUTROABS 6.8  --   --   --   --  11.9*  --   HGB 18.3*   < > 14.5 14.0 13.2 14.6 13.6  HCT 59.8*   < > 46.2 45.6 42.7 46.4 43.0  MCV 106.8*   < > 103.1* 104.8* 104.4* 103.1* 101.7*  PLT 80*   < > 32* 32* 38* 38* 38*   < > = values in this interval not displayed.     Cardiac Enzymes: No results for input(s): "CKTOTAL", "CKMB", "CKMBINDEX", "TROPONINI" in the last 168 hours.  BNP: Invalid input(s): "POCBNP"  CBG: Recent Labs  Lab 02/03/23 1608 02/03/23 1918 02/03/23 2311 02/04/23 0341 02/04/23 0744  GLUCAP 157* 167* 155* 158* 160*     Microbiology: Results for orders placed or performed during the hospital encounter of 01/29/23  Culture, Respiratory w  Gram Stain     Status: None   Collection Time: 01/29/23  3:45 AM   Specimen: Tracheal Aspirate; Respiratory  Result Value Ref Range Status   Specimen Description   Final    TRACHEAL ASPIRATE Performed at [facility], [address], [place], [place] [address]    Special Requests   Final    NONE Performed at [facility], [address] [place], [place] [address]    Gram Stain   Final    MODERATE WBC PRESENT,BOTH PMN AND MONONUCLEAR NO ORGANISMS SEEN    Culture   Final    RARE Normal respiratory flora-no Staph aureus or Pseudomonas seen Performed at [facility] [address] [place], [place] [address]    Report Status 02/01/2023 FINAL  Final  Culture, blood (Routine X 2) w Reflex to ID Panel     Status: None   Collection Time: 01/29/23  1:10 PM   Specimen: BLOOD  Result Value Ref Range Status   Specimen Description BLOOD BLOOD LEFT ARM  Final   Special Requests   Final    BOTTLES DRAWN AEROBIC AND ANAEROBIC Blood Culture adequate volume   Culture   Final    NO GROWTH 5 DAYS Performed at [facility], [address] [place], [place] [address]    Report Status 02/03/2023 FINAL  Final  MRSA Next Gen by PCR, Nasal     Status: None   Collection Time: 01/29/23  1:47 PM   Specimen: Nasal Mucosa; Nasal Swab  Result Value Ref Range Status   MRSA by PCR Next Gen NOT DETECTED NOT DETECTED Final    Comment: (NOTE) The GeneXpert MRSA Assay (FDA approved for NASAL specimens only), is one component of a comprehensive MRSA colonization surveillance program. It is not intended  to diagnose MRSA infection nor to guide or monitor treatment for MRSA infections. Test performance is not FDA approved in patients less than 22 years old. Performed at [facility], [address] [place], [place] [address]   Culture, blood (Routine X 2) w Reflex to ID Panel     Status: None   Collection Time: 01/29/23  2:10 PM   Specimen: BLOOD  Result Value  Ref Range Status   Specimen Description BLOOD A-LINE  Final   Special Requests   Final    BOTTLES DRAWN AEROBIC AND ANAEROBIC Blood Culture adequate volume   Culture   Final    NO GROWTH 5 DAYS Performed at [facility], [address] [place], [place] [address]    Report Status 02/03/2023 FINAL  Final    Coagulation Studies: No results for input(s): "LABPROT", "INR" in the last 72 hours.   Urinalysis: No results for input(s): "COLORURINE", "LABSPEC", "PHURINE", "GLUCOSEU", "HGBUR", "BILIRUBINUR", "KETONESUR", "PROTEINUR", "UROBILINOGEN", "NITRITE", "LEUKOCYTESUR" in the last 72 hours.  Invalid input(s): "APPERANCEUR"     Imaging: DG Chest Port 1 View  Result Date: 02/04/2023 CLINICAL DATA:  Acute respiratory failure with hypoxia. EXAM: PORTABLE CHEST 1 VIEW COMPARISON:  Chest radiograph February 01, 2023. FINDINGS: Endotracheal tube with tip in the midthoracic trachea 4.8 cm from the carina. Esophageal temperature probe in place. Nasogastric tube courses below the diaphragm with tip obscured by collimation. Additional cardiac monitoring leads project over the chest. Normal size heart. Aortic atherosclerosis. Apparent widening of the mediastinal vascular pedicle is favored secondary to rotation. Layering small bilateral pleural effusions with persistent right-greater-than-left airspace opacitie. No acute osseous abnormality IMPRESSION: 1. Stable layering small bilateral pleural effusions with persistent right-greater-than-left airspace opacities. Electronically Signed   By: Dahlia Bailiff M.D.   On: 02/04/2023 08:24   DG Abd 1 View  Result Date: 02/03/2023 CLINICAL DATA:  Vomiting EXAM: ABDOMEN - 1 VIEW COMPARISON:  01/30/2023 FINDINGS: Nasogastric tube noted with tip and side port in the stomach body. Gas fills the stomach. Mild scarring or atelectasis at the left lung base. A catheter projects over the left groin and extends over into the right abdomen, presumably a venous  catheter. There is also a small amount of tubing projecting over the right hip. There is evidence of lumbar spondylosis and degenerative disc disease. Unremarkable bowel gas pattern. IMPRESSION: 1. Nasogastric tube tip and side port are in the stomach body. 2. Mild scarring or atelectasis at the left lung base. 3. Unremarkable bowel gas pattern. Electronically Signed   By: Van Clines M.D.   On: 02/03/2023 15:26   CT HEAD WO CONTRAST (5MM)  Result Date: 02/03/2023 CLINICAL DATA:  Altered mental status EXAM: CT HEAD WITHOUT CONTRAST TECHNIQUE: Contiguous axial images were obtained from the base of the skull through the vertex without intravenous contrast. RADIATION DOSE REDUCTION: This exam was performed according to the departmental dose-optimization program which includes automated exposure control, adjustment of the mA and/or kV according to patient size and/or use of iterative reconstruction technique. COMPARISON:  None Available. FINDINGS: Brain: No evidence of acute infarction, hemorrhage, mass, mass effect, or midline shift. No hydrocephalus or extra-axial fluid collection. Partial empty sella. Normal craniocervical junction. Cerebral volume is within normal limits for age. Vascular: No hyperdense vessel. Skull: Negative for fracture or focal lesion. Sinuses/Orbits: Mucosal thickening in the left greater than right maxillary sinus. Other: Underpneumatized right mastoid, with trace fluid. The left mastoid air cells are well aerated. Endotracheal and orogastric tubes noted. IMPRESSION:  No acute intracranial process. Electronically Signed   By: Merilyn Baba M.D.   On: 02/03/2023 03:03     Medications:    sodium chloride 5 mL/hr at 02/02/23 [id number]   dexmedetomidine (PRECEDEX) IV infusion Stopped (02/04/23 0510)   feeding supplement (VITAL AF 1.2 CAL) Stopped (02/04/23 0725)   norepinephrine (LEVOPHED) Adult infusion Stopped (02/04/23 0903)    aspirin  81 mg Per Tube Daily   Chlorhexidine  Gluconate Cloth  6 each Topical Daily   docusate  100 mg Per Tube BID   feeding supplement (PROSource TF20)  60 mL Per Tube BID   folic acid  1 mg Per Tube Daily   free water  30 mL Per Tube Q4H   hydrocortisone sod succinate (SOLU-CORTEF) inj  50 mg Intravenous Q12H   multivitamin  1 tablet Per Tube QHS   nutrition supplement (JUVEN)  1 packet Per Tube BID BM   mouth rinse  15 mL Mouth Rinse Q2H   pantoprazole (PROTONIX) IV  40 mg Intravenous Daily   polyethylene glycol  17 g Per Tube BID   thiamine (VITAMIN B1) injection  100 mg Intravenous Daily   docusate sodium, mouth rinse, polyethylene glycol  Assessment/ Plan:  [name] is a 77 y.o. black male with chronic diastolic congestive heart failure, hypertension, COPD, hyperlipidemia, who is admitted to [facility] on 01/29/2023 for Peripheral edema [R60.9] Shock circulatory ([place]) [R57.9] Acute on chronic congestive heart failure, unspecified heart failure type ([place]) [I50.9] Acute renal failure superimposed on chronic kidney disease, unspecified acute renal failure type, unspecified CKD stage ([place]) [N17.9, N18.9]  Acute Kidney Injury on chronic kidney disease stage IV: baseline creatinine of 2.67, GFR of 24 on 01/06/23. History of bland urine. Acute kidney injury most likely secondary to cardiorenal syndrome. Chronic kidney disease secondary to hypertension.  Holding CRRT. Evaluate daily for dialysis need.   Acute exacerbation of chronic diastolic congestive heart failure and acute respiratory failure requiring intubation and mechanical ventilation.  Volume status improved.  Weaning as per ICU team  Hypotension with cardiogenic shock requiring vasopressors: Patient weaned off norepinephrine.   LOS: 6 Latunya Kissick 2/20/202412:04 PM

## 2023-02-04 NOTE — Progress Notes (Signed)
[place] NOTE       Patient ID: John [name] MRN: [id number] DOB/AGE: February 25, 1946 77 y.o.  Admit date: 01/29/2023 Referring Physician Darel Hong, NP  Primary Physician Dr. Ginette Pitman Primary Cardiologist Dr. Nehemiah Massed  Reason for Consultation elevated troponin / AoCHF  HPI: Pharoh Lebar is a 81yoM with a PMH of HFpEF (50-55%, G1 DD with a D-shaped LV c/w RV overload, mi-mod TR 10/02/2022), COPD (prn 2 L), CKD 4, chronic thrombocytopenia, HTN, hx tobacco use who presented to [facility] ED 01/28/2023 at the request of his nephrologist after outpatient labs showed acutely worsening renal function.  He was brought in by EMS with altered mental status and in circulatory shock requiring vasopressors, CRRT for acute renal failure, and mechanical intubation. Troponins checked on admission were elevated and trended 2351, 2252, and 1824. BNP 2500. Cardiology is consulted on hospital day 5 for assistance with his heart failure.   Interval History:  - remains intubated, off sedation. Mental status precluding extubation. - a little more alert today, blinking eyes open to command, but does not follow other commands - weaned off levo entirely this AM - CRRT paused for now per nephrology  Review of systems limited by patient's mental status    Past Medical History:  Diagnosis Date   CHF (congestive heart failure) (HCC)    EF 45% in 2021   Chronic kidney disease    COPD (chronic obstructive pulmonary disease) ([place]) 02/16/2020   Hyperlipidemia    Hypertension    Pneumonia 2021    Past Surgical History:  Procedure Laterality Date   CATARACT EXTRACTION W/PHACO Left 10/31/2021   Procedure: CATARACT EXTRACTION PHACO AND INTRAOCULAR LENS PLACEMENT ([place]) LEFT VISION BLUE 3.45 00:48.0;  Surgeon: Leandrew Koyanagi, MD;  Location: [place];  Service: Ophthalmology;  Laterality: Left;   CATARACT EXTRACTION W/PHACO Right 11/14/2021   Procedure: CATARACT EXTRACTION PHACO  AND INTRAOCULAR LENS PLACEMENT ([facility]) RIGHT 8.59 01:10.5;  Surgeon: Leandrew Koyanagi, MD;  Location: [place];  Service: Ophthalmology;  Laterality: Right;   INGUINAL HERNIA REPAIR Right 02/04/2017   Procedure: HERNIA REPAIR INGUINAL ADULT;  Surgeon: Leonie Green, MD;  Location: [facility];  Service: General;  Laterality: Right;   NO PAST SURGERIES     QUADRICEPS TENDON REPAIR Left 02/15/2020   Procedure: REPAIR QUADRICEP TENDON;  Surgeon: Corky Mull, MD;  Location: [facility];  Service: Orthopedics;  Laterality: Left;    Medications Prior to Admission  Medication Sig Dispense Refill Last Dose   aspirin EC 81 MG tablet Take 1 tablet (81 mg total) by mouth daily. Swallow whole. 90 tablet 3 01/28/2023   calcitRIOL (ROCALTROL) 0.25 MCG capsule Take 0.25 mcg by mouth daily.   01/28/2023   cephALEXin (KEFLEX) 500 MG capsule Take 500 mg by mouth 3 (three) times daily.   01/28/2023   gabapentin (NEURONTIN) 100 MG capsule Take 1 capsule (100 mg total) by mouth 2 (two) times daily.   01/28/2023   ipratropium-albuterol (DUONEB) 0.5-2.5 (3) MG/3ML SOLN Inhale 3 mLs into the lungs every 6 (six) hours as needed (wheezing).   unknown   losartan (COZAAR) 50 MG tablet Take 50 mg by mouth daily.   01/28/2023   metolazone (ZAROXOLYN) 2.5 MG tablet Take 1 tablet (2.5 mg total) by mouth 2 (two) times a week.   Past Week   midodrine (PROAMATINE) 2.5 MG tablet Take 2.5 mg by mouth 3 (three) times daily.   01/28/2023   omeprazole (PRILOSEC) 20 MG capsule Take 20 mg by  mouth daily.   01/28/2023   potassium chloride SA (KLOR-CON M) 20 MEQ tablet Take 1 tablet (20 mEq total) by mouth daily. With extra 71mq on M/ F with metolazone 100 tablet 3 Past Week   torsemide 60 MG TABS Take 60 mg by mouth daily. 30 tablet 1 01/28/2023   Social History   Socioeconomic History   Marital status: Single    Spouse name: Not on file   Number of children: Not on file   Years of education: Not on file   Highest education  level: Not on file  Occupational History   Not on file  Tobacco Use   Smoking status: Former    Packs/day: 0.25    Years: 53.00    Total pack years: 13.25    Types: Cigarettes    Quit date: 02/15/2015    Years since quitting: 7.9   Smokeless tobacco: Never  Substance and Sexual Activity   Alcohol use: Yes    Comment: occassional   Drug use: No   Sexual activity: Not on file  Other Topics Concern   Not on file  Social History Narrative   Not on file   Social Determinants of Health   Financial Resource Strain: Low Risk  (07/17/2022)   Overall Financial Resource Strain (CARDIA)    Difficulty of Paying Living Expenses: Not hard at all  Food Insecurity: No Food Insecurity (01/29/2023)   Hunger Vital Sign    Worried About Running Out of Food in the Last Year: Never true    Ran Out of Food in the Last Year: Never true  Transportation Needs: No Transportation Needs (01/29/2023)   PRAPARE - THydrologist(Medical): No    Lack of Transportation (Non-Medical): No  Physical Activity: Sufficiently Active (07/17/2022)   Exercise Vital Sign    Days of Exercise per Week: 5 days    Minutes of Exercise per Session: 60 min  Stress: No Stress Concern Present (07/17/2022)   [place]   Feeling of Stress : Not at all  Social Connections: SKulm(07/17/2022)   Social Connection and Isolation Panel [NHANES]    Frequency of Communication with Friends and Family: More than three times a week    Frequency of Social Gatherings with Friends and Family: More than three times a week    Attends Religious Services: 1 to 4 times per year    Active Member of [employer] Organizations: Yes    Attends CArchivistMeetings: More than 4 times per year    Marital Status: Living with partner  Intimate Partner Violence: Not At Risk (01/29/2023)   Humiliation, Afraid, Rape, and Kick questionnaire    Fear of  Current or Ex-Partner: No    Emotionally Abused: No    Physically Abused: No    Sexually Abused: No    Family History  Problem Relation Age of Onset   Diabetes Mother    Diabetes Brother    Diabetes Maternal Grandmother    Diabetes Paternal Grandmother       Intake/Output Summary (Last 24 hours) at 02/04/2023 0840 Last data filed at 02/04/2023 0748 Gross per 24 hour  Intake 2115.59 ml  Output 5163 ml  Net -3047.41 ml     Vitals:   02/04/23 0730 02/04/23 0745 02/04/23 0800 02/04/23 0815  BP: 1'01/73 97/74 99/68 '$ 102/76  Pulse: 90 95 88 95  Resp: (!) '23 20 20 17  '$ Temp: (Marland Kitchen  97.5 F (36.4 C) 99.1 F (37.3 C) 99.1 F (37.3 C) 99 F (37.2 C)  TempSrc:   Esophageal   SpO2: 98% 98% 98% 98%  Weight:      Height:        PHYSICAL EXAM General: Acute on chronically ill-appearing black male, intubated. Blinks eyes open to voice HEENT:  Normocephalic and atraumatic. Neck:  No JVD.  Lungs: mechanically ventilated breath sounds present bilaterally, coarse to ausculation anteriorly.  Heart: tachy but regular. Normal S1 and S2 without gallops or murmurs.  Abdomen: distended Msk:  Extremities: No clubbing, cyanosis. Trace bilateral peripheral edema, chronic hyperpigmentation + skin wrinkling/dimpling Neuro: moves head and blinks eyes open to voice. Psych:  unable to assess  Labs: Basic Metabolic Panel: Recent Labs    02/03/23 0520 02/03/23 1735 02/04/23 0305 02/04/23 0400  NA 136 134* 135  --   K 2.9* 4.0 3.5  --   CL 108 102 103  --   CO2 '24 26 26  '$ --   GLUCOSE 143* 153* 168*  --   BUN 43* 55* 50*  --   CREATININE 1.28* 1.87* 1.75*  --   CALCIUM 7.8* 9.0 8.8*  --   MG 1.8  --   --  2.3  PHOS 1.8* 3.9 2.2*  --     Liver Function Tests: Recent Labs    02/03/23 0520 02/03/23 1735 02/04/23 0305  AST 17  --   --   ALT 10  --   --   ALKPHOS 50  --   --   BILITOT 1.6*  --   --   PROT 5.0*  --   --   ALBUMIN 2.3*  2.4* 2.8* 2.7*    No results for input(s):  "LIPASE", "AMYLASE" in the last 72 hours. CBC: Recent Labs    02/03/23 0956 02/04/23 0400  WBC 14.0* 13.3*  NEUTROABS 11.9*  --   HGB 14.6 13.6  HCT 46.4 43.0  MCV 103.1* 101.7*  PLT 38* 38*    Cardiac Enzymes: No results for input(s): "CKTOTAL", "CKMB", "CKMBINDEX", "TROPONINIHS" in the last 72 hours. BNP: Recent Labs    02/03/23 0956  BNP 1,706.8*    D-Dimer: No results for input(s): "DDIMER" in the last 72 hours. Hemoglobin A1C: No results for input(s): "HGBA1C" in the last 72 hours. Fasting Lipid Panel: No results for input(s): "CHOL", "HDL", "LDLCALC", "TRIG", "CHOLHDL", "LDLDIRECT" in the last 72 hours. Thyroid Function Tests: No results for input(s): "TSH", "T4TOTAL", "T3FREE", "THYROIDAB" in the last 72 hours.  Invalid input(s): "FREET3" Anemia Panel: No results for input(s): "VITAMINB12", "FOLATE", "FERRITIN", "TIBC", "IRON", "RETICCTPCT" in the last 72 hours.   Radiology: DG Abd 1 View  Result Date: 02/03/2023 CLINICAL DATA:  Vomiting EXAM: ABDOMEN - 1 VIEW COMPARISON:  01/30/2023 FINDINGS: Nasogastric tube noted with tip and side port in the stomach body. Gas fills the stomach. Mild scarring or atelectasis at the left lung base. A catheter projects over the left groin and extends over into the right abdomen, presumably a venous catheter. There is also a small amount of tubing projecting over the right hip. There is evidence of lumbar spondylosis and degenerative disc disease. Unremarkable bowel gas pattern. IMPRESSION: 1. Nasogastric tube tip and side port are in the stomach body. 2. Mild scarring or atelectasis at the left lung base. 3. Unremarkable bowel gas pattern. Electronically Signed   By: Van Clines M.D.   On: 02/03/2023 15:26   CT HEAD WO CONTRAST (5MM)  Result Date:  02/03/2023 CLINICAL DATA:  Altered mental status EXAM: CT HEAD WITHOUT CONTRAST TECHNIQUE: Contiguous axial images were obtained from the base of the skull through the vertex without  intravenous contrast. RADIATION DOSE REDUCTION: This exam was performed according to the departmental dose-optimization program which includes automated exposure control, adjustment of the mA and/or kV according to patient size and/or use of iterative reconstruction technique. COMPARISON:  None Available. FINDINGS: Brain: No evidence of acute infarction, hemorrhage, mass, mass effect, or midline shift. No hydrocephalus or extra-axial fluid collection. Partial empty sella. Normal craniocervical junction. Cerebral volume is within normal limits for age. Vascular: No hyperdense vessel. Skull: Negative for fracture or focal lesion. Sinuses/Orbits: Mucosal thickening in the left greater than right maxillary sinus. Other: Underpneumatized right mastoid, with trace fluid. The left mastoid air cells are well aerated. Endotracheal and orogastric tubes noted. IMPRESSION: No acute intracranial process. Electronically Signed   By: Merilyn Baba M.D.   On: 02/03/2023 03:03   DG Chest Port 1 View  Result Date: 02/01/2023 CLINICAL DATA:  Dyspnea EXAM: PORTABLE CHEST 1 VIEW COMPARISON:  01/29/2023 FINDINGS: There is pulmonary vascular congestion. Bibasilar consolidation or volume loss. No pneumothorax. Possible small pleural effusions. Endotracheal tube tip 3 cm above carina. NG tube tip below the diaphragm and off x-ray. Mediastinal drain or probe overlies the mid thorax. IMPRESSION: Vascular congestion.  Bibasilar consolidation or volume loss. Electronically Signed   By: Sammie Bench M.D.   On: 02/01/2023 13:56   CT Angio Chest Pulmonary Embolism (PE) W or WO Contrast  Result Date: 01/30/2023 CLINICAL DATA:  Chronic hypoxemia and hypercapnia related to COPD, congestive heart failure and chronic renal failure. EXAM: CT ANGIOGRAPHY CHEST WITH CONTRAST TECHNIQUE: Multidetector CT imaging of the chest was performed using the standard protocol during bolus administration of intravenous contrast. Multiplanar CT image  reconstructions and MIPs were obtained to evaluate the vascular anatomy. RADIATION DOSE REDUCTION: This exam was performed according to the departmental dose-optimization program which includes automated exposure control, adjustment of the mA and/or kV according to patient size and/or use of iterative reconstruction technique. CONTRAST:  47m OMNIPAQUE IOHEXOL 350 MG/ML SOLN COMPARISON:  Portable chest obtained yesterday. Chest, abdomen and pelvis CT dated 01/18/2022. FINDINGS: Cardiovascular: Enlarged heart. Enlarged ascending thoracic aorta measuring 5.2 cm in diameter at the level of the main pulmonary artery. Enlarged central pulmonary arteries with a main pulmonary artery measuring 3.6 cm in diameter. Maximum aortic arch diameter of 3.2 cm. Descending thoracic aortic diameter of 3.1 cm. Normally opacified pulmonary arteries with no pulmonary arterial filling defects seen. Atheromatous calcifications, including the coronary arteries and aorta. Mediastinum/Nodes: Endotracheal tube in satisfactory position. Enteric tube extending through the esophagus with its tip in the proximal to mid stomach. No enlarged lymph nodes. Unremarkable thyroid gland. Lungs/Pleura: Small to moderate-sized right pleural effusion and small left pleural effusion. Diffuse bilateral centrilobular and some paraseptal bullous changes, most pronounced in the upper lobes. Mild bilateral lower lobe dependent atelectasis. No airspace consolidation suspicious for pneumonia. No lung nodules. Upper Abdomen: The liver has mildly nodular contours with somewhat prominent lateral segment left lobe and caudate lobe and somewhat small right lobe. Two small probable cysts in the right lobe of the liver. Normal sized spleen. Small to moderate amount of ascites in the upper abdomen. Musculoskeletal: Minimal thoracic spine degenerative changes. Review of the MIP images confirms the above findings. IMPRESSION: 1. No pulmonary emboli. 2. Ascending thoracic  aortic aneurysm measuring 5.2 cm in maximum diameter. Recommend semi-annual imaging followup by CTA  or MRA and referral to cardiothoracic surgery if not already obtained. This recommendation follows 2010 ACCF/AHA/AATS/ACR/ASA/SCA/SCAI/SIR/STS/SVM Guidelines for the Diagnosis and Management of Patients With Thoracic Aortic Disease. Circulation. 2010; [id number]. Aortic aneurysm NOS (ICD10-I71.9) 3. Enlarged central pulmonary arteries compatible with pulmonary arterial hypertension. 4. Small to moderate-sized right pleural effusion and small left pleural effusion. 5. Changes of COPD and centrilobular and paraseptal emphysema. 6. Findings suggesting cirrhosis of the liver with an associated small to moderate amount of ascites in the upper abdomen. 7.  Calcific coronary artery and aortic atherosclerosis. Aortic Atherosclerosis (ICD10-I70.0) and Emphysema (ICD10-J43.9). Electronically Signed   By: Claudie Revering M.D.   On: 01/30/2023 18:48   ECHOCARDIOGRAM COMPLETE  Result Date: 01/30/2023    ECHOCARDIOGRAM REPORT   Patient Name:   Savio Delrossi Date of Exam: 01/30/2023 Medical Rec #:  [id number]      Height:       71.0 in Accession #:    [id number]     Weight:       223.3 lb Date of Birth:  [birth year]     BSA:          2.210 m Patient Age:    78 years       BP:           163/69 mmHg Patient Gender: M              HR:           58 bpm. Exam Location:  [facility] Procedure: 2D Echo, Cardiac Doppler and Color Doppler Indications:     Elevated Troponin  History:         Patient has prior history of Echocardiogram examinations, most                  recent 10/02/2022. CHF, COPD; Risk Factors:Hypertension.                  Cardiogenic shock, ESRD.  Sonographer:     Wenda Low Referring Phys:  [id number] BRITTON L [place] Diagnosing Phys: Ida Rogue MD  Sonographer Comments: Suboptimal apical window and echo performed with patient supine and on artificial respirator. IMPRESSIONS  1. Left ventricular ejection  fraction, by estimation, is 50 to 55%. The left ventricle has low normal function. The left ventricle has no regional wall motion abnormalities. There is mild left ventricular hypertrophy. Left ventricular diastolic parameters are consistent with Grade I diastolic dysfunction (impaired relaxation). There is the interventricular septum is flattened in systole and diastole, consistent with right ventricular pressure and volume overload.  2. Right ventricular systolic function is severely reduced. The right ventricular size is moderately enlarged. There is severely elevated pulmonary artery systolic pressure. The estimated right ventricular systolic pressure is [id number] mmHg.  3. Right atrial size was moderately dilated.  4. The mitral valve is normal in structure. Mild mitral valve regurgitation. No evidence of mitral stenosis. There is mild late systolic prolapse of both leaflets of the mitral valve.  5. Tricuspid valve regurgitation is mild to moderate.  6. The aortic valve is tricuspid. Aortic valve regurgitation is mild to moderate. No aortic stenosis is present.  7. The inferior vena cava is dilated in size with <50% respiratory variability, suggesting right atrial pressure of 15 mmHg.  8. There is mild dilatation of the aortic root, measuring 44 mm. There is mild dilatation of the ascending aorta, measuring 43 mm. FINDINGS  Left Ventricle: Left ventricular ejection fraction, by estimation, is 50 to 55%. The left ventricle  has low normal function. The left ventricle has no regional wall motion abnormalities. The left ventricular internal cavity size was normal in size. There is mild left ventricular hypertrophy. The interventricular septum is flattened in systole and diastole, consistent with right ventricular pressure and volume overload. Left ventricular diastolic parameters are consistent with Grade I diastolic dysfunction (impaired relaxation). Right Ventricle: The right ventricular size is moderately enlarged.  No increase in right ventricular wall thickness. Right ventricular systolic function is severely reduced. There is severely elevated pulmonary artery systolic pressure. The tricuspid regurgitant velocity is 3.45 m/s, and with an assumed right atrial pressure of 15 mmHg, the estimated right ventricular systolic pressure is [id number] mmHg. Left Atrium: Left atrial size was normal in size. Right Atrium: Right atrial size was moderately dilated. Pericardium: There is no evidence of pericardial effusion. Mitral Valve: The mitral valve is normal in structure. There is mild late systolic prolapse of both leaflets of the mitral valve. Mild mitral valve regurgitation. No evidence of mitral valve stenosis. MV peak gradient, 3.9 mmHg. The mean mitral valve gradient is 1.0 mmHg. Tricuspid Valve: The tricuspid valve is normal in structure. Tricuspid valve regurgitation is mild to moderate. No evidence of tricuspid stenosis. Aortic Valve: The aortic valve is tricuspid. Aortic valve regurgitation is mild to moderate. No aortic stenosis is present. Aortic valve mean gradient measures 1.0 mmHg. Aortic valve peak gradient measures 3.9 mmHg. Aortic valve area, by VTI measures 3.79 cm. Pulmonic Valve: The pulmonic valve was normal in structure. Pulmonic valve regurgitation is mild to moderate. No evidence of pulmonic stenosis. Aorta: The aortic root is normal in size and structure. There is mild dilatation of the aortic root, measuring 44 mm. There is mild dilatation of the ascending aorta, measuring 43 mm. Venous: The inferior vena cava is dilated in size with less than 50% respiratory variability, suggesting right atrial pressure of 15 mmHg. IAS/Shunts: No atrial level shunt detected by color flow Doppler.  LEFT VENTRICLE PLAX 2D LVIDd:         4.40 cm   Diastology LVIDs:         3.00 cm   LV e' medial:    6.85 cm/s LV PW:         1.30 cm   LV E/e' medial:  6.3 LV IVS:        1.30 cm   LV e' lateral:   5.98 cm/s LVOT diam:     2.10 cm    LV E/e' lateral: 7.2 LV SV:         68 LV SV Index:   31 LVOT Area:     3.46 cm  RIGHT VENTRICLE RV Basal diam:  5.10 cm RV Mid diam:    5.90 cm RV S prime:     14.50 cm/s TAPSE (M-mode): 1.8 cm LEFT ATRIUM             Index        RIGHT ATRIUM           Index LA diam:        2.90 cm 1.31 cm/m   RA Area:     27.50 cm LA Vol (A2C):   59.3 ml 26.84 ml/m  RA Volume:   101.00 ml 45.71 ml/m LA Vol (A4C):   59.1 ml 26.74 ml/m LA Biplane Vol: 59.4 ml 26.88 ml/m  AORTIC VALVE                    PULMONIC VALVE AV  Area (Vmax):    3.22 cm     PV Vmax:       1.00 m/s AV Area (Vmean):   4.15 cm     PV Peak grad:  4.0 mmHg AV Area (VTI):     3.79 cm AV Vmax:           98.70 cm/s AV Vmean:          45.500 cm/s AV VTI:            0.180 m AV Peak Grad:      3.9 mmHg AV Mean Grad:      1.0 mmHg LVOT Vmax:         91.80 cm/s LVOT Vmean:        54.500 cm/s LVOT VTI:          0.197 m LVOT/AV VTI ratio: 1.09  AORTA Ao Root diam: 4.40 cm Ao Asc diam:  4.30 cm MITRAL VALVE               TRICUSPID VALVE MV Area (PHT): 2.10 cm    TR Peak grad:   47.6 mmHg MV Area VTI:   2.38 cm    TR Vmax:        345.00 cm/s MV Peak grad:  3.9 mmHg MV Mean grad:  1.0 mmHg    SHUNTS MV Vmax:       0.99 m/s    Systemic VTI:  0.20 m MV Vmean:      42.3 cm/s   Systemic Diam: 2.10 cm MV Decel Time: 361 msec MV E velocity: 43.00 cm/s MV A velocity: 84.90 cm/s MV E/A ratio:  0.51 Ida Rogue MD Electronically signed by Ida Rogue MD Signature Date/Time: 01/30/2023/4:10:59 PM    Final    DG Abd Portable 1V  Result Date: 01/30/2023 CLINICAL DATA:  NGT placement EXAM: PORTABLE ABDOMEN - 1 VIEW COMPARISON:  01/29/2023 FINDINGS: Image of the upper abdomen demonstrates a NG tube superimposed with the stomach below the diaphragm. IMPRESSION: NG tube appropriately positioned. Electronically Signed   By: Sammie Bench M.D.   On: 01/30/2023 13:12   DG Chest Port 1 View  Result Date: 01/29/2023 CLINICAL DATA:  Intubation EXAM: PORTABLE CHEST 1  VIEW COMPARISON:  01/29/2023 8:30 a.m. FINDINGS: Interval placement of endotracheal tube, tip over the midtrachea, and esophagogastric tube, tip not visualized although below the diaphragm. Cardiomegaly. Small bilateral pleural effusions. Osseous structures unremarkable. IMPRESSION: 1. Interval placement of endotracheal tube, tip over the midtrachea, and esophagogastric tube, tip not visualized although below the diaphragm. 2. Cardiomegaly. 3. Small bilateral pleural effusions. Electronically Signed   By: Delanna Ahmadi M.D.   On: 01/29/2023 17:03   DG Abd 1 View  Result Date: 01/29/2023 CLINICAL DATA:  Orogastric tube placement. EXAM: ABDOMEN - 1 VIEW COMPARISON:  None Available. FINDINGS: Distal tip of nasogastric tube is seen in expected position of gastroesophageal junction. No abnormal bowel dilatation is noted. IMPRESSION: Distal tip of nasogastric tube is seen in expected position of gastroesophageal junction. Advancement is recommended. Electronically Signed   By: Marijo Conception M.D.   On: 01/29/2023 17:02   DG Chest Portable 1 View  Result Date: 01/29/2023 CLINICAL DATA:  Shortness of breath. EXAM: PORTABLE CHEST 1 VIEW COMPARISON:  01/21/2022 FINDINGS: Leftward patient rotation. The cardio pericardial silhouette is enlarged. Vascular congestion noted with bibasilar atelectasis/infiltrate and probable layering small bilateral effusions. The visualized bony structures of the thorax are unremarkable. Telemetry leads overlie the chest. IMPRESSION: Vascular congestion  with bibasilar atelectasis/infiltrate and probable layering small bilateral effusions. Electronically Signed   By: Misty Stanley M.D.   On: 01/29/2023 08:38     TELEMETRY reviewed by me (LT) 02/04/2023 : sinus tachycardia rate low 100s with occasional PVCs  EKG reviewed by me: NSR nonspecific TW changes  Data reviewed by me (LT) 02/04/2023: nephrology note critical care progress note, last 24 hours vitals, imaging, labs,  telemetry  Principal Problem:   Shock circulatory ([place]) Active Problems:   Acute on chronic congestive heart failure ([place])   Toxic metabolic encephalopathy    ASSESSMENT AND PLAN:  Govanny Etheridge is a 51yoM with a PMH of HFpEF (50-55%, G1 DD with a D-shaped LV c/w RV overload, mi-mod TR 10/02/2022), COPD (prn 2 L), CKD 4, chronic thrombocytopenia, HTN, hx tobacco use who presented to [facility] ED 01/28/2023 at the request of his nephrologist after outpatient labs showed acutely worsening renal function.  He was brought in by EMS with altered mental status and in circulatory shock requiring vasopressors, CRRT for acute renal failure, and mechanical intubation. Troponins checked on admission were elevated and trended 2351, 2252, and 1824. BNP 2500. Cardiology is consulted on hospital day 5 for assistance with his heart failure.    # acute hypoxic respiratory failure # toxic metabolic encephalopathy # shock In the setting of acute renal failure and AoC HFpEF as below with underlying COPD/pulm HTN. Remains intubated on minimal sedation today, mental status precluding extubation.  -weaned off levo entirely today. BP low normal.   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 Presented with decreased UOP and doubling of Cr per outpatient labs. Now oliguric. CRRT. Paused.  # acute on chronic HFpEF BNP elevated at 2500 on admission, pleural effusions + severe RV dysfunction & cirrhotic changes on imaging. Unclear of trigger for this decompensation, query progressively worsening renal function in the setting of aggressive outpatient diuretics. Dry weight reportedly ~198lbs. 206lbs today. Still clinically hypervolemic appearing, although improving. -volume removal per CRRT (currently paused)  -GDMT limited by hypotension requiring vasopressors. On midodrine at baseline.    # demand ischemia Troponins elevated and trending 2351, 2252, and  1824 on admission. Echo with severe RV dysfunction w/o rWMAs. No known hx of CAD.  Suspect demand in setting of acute renal failure and volume overload with possible underlying CAD. Would consider ischemic eval at some point, but coagulopathy (38K plts) and overall hemodynamic instability preclude this currently.  # thrombocytopenia  Chronic in nature, drift from 91K -- low 32K plts, currently 38K without active bleeding.    This patient's plan of care was discussed and created with Dr. Saralyn Pilar and he is in agreement.  Signed: Tristan Schroeder , PA-C 02/04/2023, 8:40 AM [facility]

## 2023-02-04 NOTE — Progress Notes (Signed)
PHARMACY CONSULT NOTE   Pharmacy Consult for Electrolyte Monitoring and Replacement   Recent Labs: Potassium (mmol/L)  Date Value  02/04/2023 3.5   Magnesium (mg/dL)  Date Value  02/04/2023 2.3   Calcium (mg/dL)  Date Value  02/04/2023 8.8 (L)   Albumin (g/dL)  Date Value  02/04/2023 2.7 (L)   Phosphorus (mg/dL)  Date Value  02/04/2023 2.2 (L)   Sodium (mmol/L)  Date Value  02/04/2023 135    Assessment: 77 year old male PMH HTN, CKD4, diastolic and systolic heart failure with cor pulmonale, COPD on 2L PTA, history ascending aortic aneurysm, CAD. Patient now off CRRT.  Goal of Therapy:  K > 4 Mg > 2 All other electrolytes within normal limits  Plan: 2/20 @ 0520: K 3.5, Phos 2.2, Ca 9.8, Mg 2.3 Per discussion with nephrology, will give 2 PhosNak packets x 1 to help with respiratory status  Will M. Ouida Sills, PharmD PGY-1 Pharmacy Resident 02/04/2023 7:56 AM

## 2023-02-04 NOTE — Progress Notes (Signed)
Updated pt's significant other at bedside.  All questions answered.    Darel Hong, AGACNP-BC [facility] Prefer epic messenger for cross cover needs If after hours, please call E-link

## 2023-02-04 NOTE — Progress Notes (Signed)
NAME:  John [name], MRN:  [id number], DOB:  05-Mar-1946, LOS: 6 ADMISSION DATE:  01/29/2023, CONSULTATION DATE:  01/29/2023 REFERRING MD:  Dr. Cheri Fowler, CHIEF COMPLAINT:  Altered Mental Status, worsening renal function   Brief Pt Description / Synopsis:  77 y.o. male admitted with shock (? Cardiogenic vs ? Septic), Acute Hypoxic Respiratory Failure in the setting of Acute Decompensated HFpEF, and AKI on CKD Stage IV requiring intubation and mechanical ventilation, along with initiation of CRRT.  History of Present Illness:  This is 77 year old male with a history of essential hypertension, on hydralazine and Cozaar, stage IV CKD but has not been on dialysis in the past, has chronic diastolic and systolic heart failure with cor pulmonale, moderate tricuspid regurgitation and aortic valve regurgitation, history of a ascending aortic aneurysm, coronary artery disease with atherosclerosis, COPD and chronic hypoxemia baseline 2 L/min supplemental oxygen at home with recurrent episodes of hypoxemia and remote acute exacerbation of COPD with hypoxemia and hypercapnia 2 months ago history of ruptured left quadricep tendon 2 years ago, chronic thrombocytopenia and erythrocytosis recurrent lower extremity cellulitis, recurrent hyperkalemia, morbid obesity history of anasarca dyslipidemia, recent admission for acute CHF exacerbation requiring Lasix drip and BiPAP support, who was brought in by EMS due to worsening altered mental status and circulatory shock. I was able to meet with wife at bedside during my evaluation who shares patient has been sleeping majority of each day over the last week has been on arousable and received phone call from medical provider regarding worsening kidney function which prompted ER evaluation. He had chest x-ray performed while in the ER showing bilateral pleural effusions with vascular congestion and atelectasis/infiltrate. Patient in circulatory shock with Levophed support on  admission in ER. PCCM consultation for admission to medical intensive care unit with circulatory shock consistent with acute on chronic renal failure with CHF exacerbation.   Please see "significant hospital events" section below for full detailed hospital course.  Pertinent  Medical History   Past Medical History:  Diagnosis Date   CHF (congestive heart failure) (HCC)    EF 45% in 2021   Chronic kidney disease    COPD (chronic obstructive pulmonary disease) ([place]) 02/16/2020   Hyperlipidemia    Hypertension    Pneumonia 2021    Micro Data:  2/14: Tracheal aspirate>> normal respiratory [place]ora 2/14: Blood culture x2>> negative 2/14: MRSA PCR>>negative 2/14: Strep pneumo & Legionella urinary antigens>> negative  Antimicrobials:   Anti-infectives (From admission, onward)    Start     Dose/Rate Route Frequency Ordered Stop   01/31/23 1700  ceFEPIme (MAXIPIME) 2 g in sodium chloride 0.9 % 100 mL IVPB  Status:  Discontinued        2 g 200 mL/hr over 30 Minutes Intravenous Every 12 hours 01/31/23 1555 02/03/23 1048   01/30/23 1700  vancomycin (VANCOREADY) IVPB 1250 mg/250 mL  Status:  Discontinued        1,250 mg 166.7 mL/hr over 90 Minutes Intravenous Every 24 hours 01/29/23 1543 01/30/23 1036   01/30/23 1300  Ampicillin-Sulbactam (UNASYN) 3 g in sodium chloride 0.9 % 100 mL IVPB  Status:  Discontinued        3 g 200 mL/hr over 30 Minutes Intravenous Every 8 hours 01/30/23 1036 01/31/23 1118   01/30/23 0000  vancomycin variable dose per unstable renal function (pharmacist dosing)  Status:  Discontinued         Does not apply See admin instructions 01/29/23 1125 01/29/23 1548   01/29/23 2200  piperacillin-tazobactam (ZOSYN) IVPB 3.375 g  Status:  Discontinued        3.375 g 12.5 mL/hr over 240 Minutes Intravenous Every 6 hours 01/29/23 1547 01/29/23 1548   01/29/23 2200  piperacillin-tazobactam (ZOSYN) IVPB 3.375 g  Status:  Discontinued        3.375 g 100 mL/hr over 30 Minutes  Intravenous Every 6 hours 01/29/23 1548 01/30/23 1036   01/29/23 2100  piperacillin-tazobactam (ZOSYN) IVPB 2.25 g  Status:  Discontinued        2.25 g 100 mL/hr over 30 Minutes Intravenous Every 8 hours 01/29/23 1125 01/29/23 1547   01/29/23 1115  piperacillin-tazobactam (ZOSYN) IVPB 3.375 g        3.375 g 100 mL/hr over 30 Minutes Intravenous  Once 01/29/23 1103 01/29/23 1457   01/29/23 1115  vancomycin (VANCOREADY) IVPB 2000 mg/400 mL        2,000 mg 200 mL/hr over 120 Minutes Intravenous  Once 01/29/23 1104 01/29/23 1656        Significant Hospital Events: Including procedures, antibiotic start and stop dates in addition to other pertinent events   01/30/23- I met with girlfriend of patient today to review short term medical plan.  He had Echo whith RV dysfunction and concern for PE.  We have started CRRT yesterday with additional interval improvement. He will have CTPE today.  He is weaned down on pressors today on vasor 0.4 and 4 of levophed reduced from 36 this morning.  01/31/23- remains agitated intermittently overnight. Continued issues with clot burden through CRRT circuit. 02/01/23- remains agitated overnight. Requiring numerous ativan pushes. Attempting to wean down sedation. Failed SBT miserably. 02/02/23- issues with CRRT overnight. Will attempt higher UF today. Assess for possible chest tube placement.  02/03/23- No issues with CRRT overnight, currently running without complication.  CT Head obtained overnight and negative for acute process.  Off Precedex this morning, currently somnolent, mental status currently precluding extubation. Will perform SBT as mental status permits.  Stop Cefepime, checking ammonia. 02/04/23- Remains on CRRT. Pt more awake and tracking.  SBT in progress, hopeful for extubation  Interim History / Subjective:  -No significant events noted overnight -Afebrile, hemodynamically stable, on low dose levophed -Remains on CRRT and oliguric ~ UOP 196 cc last 24  hrs (net - 8.8L) -On minimal vent settings, plan for WUA and SBT as able -Platelets unchanged at 38  ~ no overt bleeding noted, Hgb remains stable at 13.6 ~ Heparin gtt remains off -WBC improved to 13.3 from 14, no fevers noted -Pt more awake today, will track ~ SBT in progress, hopeful for extubation   Objective   Blood pressure 97/74, pulse 95, temperature 99.1 F (37.3 C), resp. rate 20, height 5' 11"$  (1.803 m), weight 93.7 kg, SpO2 98 %.    Vent Mode: PRVC FiO2 (%):  [40 %] 40 % Set Rate:  [16 bmp] 16 bmp Vt Set:  [500 mL] 500 mL PEEP:  [5 cmH20] 5 cmH20 Pressure Support:  [8 cmH20] 8 cmH20   Intake/Output Summary (Last 24 hours) at 02/04/2023 0801 Last data filed at 02/04/2023 [id number] Gross per 24 hour  Intake 2115.59 ml  Output 5163 ml  Net -3047.41 ml    Filed Weights   02/01/23 0500 02/02/23 0410 02/03/23 0500  Weight: 98 kg 95.9 kg 93.7 kg    Examination: General: Acute on chronically ill-appearing male, laying in bed, intubated and sedated, no acute distress HENT: Atraumatic, normocephalic, neck supple, no JVD Lungs: Coarse breath sounds throughout, occasionally  overbreathing the vent, even, nonlabored Cardiovascular: Regular rate and rhythm, S1-S2, no murmurs, rubs, gallops Abdomen: Soft, nontender, nondistended, no guarding rebound tenderness, bowel sounds positive x 4 Extremities: Normal bulk and tone, no deformities, chronic stasis dermatitis with 2+ edema bilaterally Neuro: Awake, Precedex currently off, Tracking, currently not following commands, pupils PERRLA GU: Foley catheter in place draining scant amount of urine  Resolved Hospital Problem list     Assessment & Plan:   #Acute Hypoxic Respiratory Failure in setting of Pulmonary Edema, Pulmonary Hypertension, & small bilateral Pleural Effusions PMHx: COPD CTA Chest 01/30/23: negative for PE, Enlarged central pulmonary arteries compatible with pulmonary arterial hypertension, Small to moderate-sized right  pleural effusion and small left pleural effusion. -Full vent support, implement lung protective strategies -Plateau pressures less than 30 cm H20 -Wean FiO2 & PEEP as tolerated to maintain O2 sats >92% -Follow intermittent Chest X-ray & ABG as needed -Spontaneous Breathing Trials when respiratory parameters met and mental status permits -Implement VAP Bundle -Prn Bronchodilators -Volume removal with CRRT/HD  #Shock: ? Cardiogenic vs ? Septic  #Acute Decompensated HFpEF & Right sided HF #Moderate Tricuspid & Aortic Regurgitation #Elevated Troponin in setting of NSTEMI vs [place] ischemia Echocardiogram 01/30/23: LVEF 50055%, Grade I DD, severely reduced RV systolic function, mild to moderate TR & AR -Continuous cardiac monitoring -Maintain MAP >65 -Vasopressors as needed to maintain MAP goal  -Lactic acid is normalized -HS Troponin peaked at 2351 -Cardiology following, appreciate input -Volume removal with CRRT  #AKI on CKD Stage IV #Hypokalemia -Monitor I&O's / urinary output -Follow BMP -Ensure adequate renal perfusion -Avoid nephrotoxic agents as able -Replace electrolytes as indicated ~ Pharmacy following for assistance -Nephrology following, appreciate input ~ CRRT/HD as per Nephrology  #Thrombocytopenia, suspect due to Uremia superimposed on chronic thrombocytopenia #Concern for possible HIT ~ RULED OUT -Monitor for S/Sx of bleeding -Trend CBC -SCD's for VTE Prophylaxis (Heparin gtt on hold) -Transfuse for Hgb <7 -Transfuse Platelets for PLT count <50K with active bleeding -HIT panel pending ~ HIT Ab only slightly elevated at 0.446 ~ discussed with Pharmacy, this result is not high enough for positive HIT  #Leukocytosis, previous fevers ~ IMPROVED -Monitor fever curve -Trend WBC's & Procalcitonin -Follow cultures as above -Will stop empiric Cefepime pending cultures & sensitivities (has completed 6 days of empiric ABX) ~ discussed with Dr. Genia Harold 2/19, stopped Cefepime  and monitor off ABX as all cultures and imaging all negative to date  #Acute Metabolic Encephalopathy vs ? Hepatic Encephalopathy #Sedation needs in setting of mechanical ventilation CT Head negative for acute intracranial abnormality -Treatment of metabolic derangements as outlined above -Maintain a RASS goal of 0 to -1 -Precedex as needed to maintain RASS goal -Avoid sedating medications as able -Daily wake up assessment -Ammonia was 45 ~ received Lactulose      Pt is critically ill superimposed on multiple chronic co-morbidities.  Prognosis is guarded, high risk for further decompensation, cardiac arrest, and death.  Recommend DNR status.   Best Practice (right click and "Reselect all SmartList Selections" daily)   Diet/type: tubefeeds and NPO DVT prophylaxis: SCD GI prophylaxis: PPI Lines: Left femoral HD catheter, and is still needed Foley:  Yes, and it is still needed Code Status:  full code Last date of multidisciplinary goals of care discussion [02/04/23]  2/20: Will update pt's significant other when she arrives at bedside  Labs   CBC: Recent Labs  Lab 01/29/23 0702 01/29/23 2227 02/01/23 0500 02/02/23 0413 02/03/23 0520 02/03/23 0956 02/04/23 0400  WBC  8.6   < > 7.8 8.9 12.6* 14.0* 13.3*  NEUTROABS 6.8  --   --   --   --  11.9*  --   HGB 18.3*   < > 14.5 14.0 13.2 14.6 13.6  HCT 59.8*   < > 46.2 45.6 42.7 46.4 43.0  MCV 106.8*   < > 103.1* 104.8* 104.4* 103.1* 101.7*  PLT 80*   < > 32* 32* 38* 38* 38*   < > = values in this interval not displayed.     Basic Metabolic Panel: Recent Labs  Lab 01/31/23 0358 01/31/23 1114 02/01/23 0500 02/01/23 1702 02/02/23 0413 02/02/23 1605 02/03/23 0520 02/03/23 1735 02/04/23 0305 02/04/23 0400  NA 136   < > 133*   < > 135 133* 136 134* 135  --   K 4.2   < > 3.5   < > 3.7 3.4* 2.9* 4.0 3.5  --   CL 101   < > 101   < > 105 102 108 102 103  --   CO2 24   < > 25   < > 26 25 24 26 26  $ --   GLUCOSE 133*   < >  167*   < > 187* 135* 143* 153* 168*  --   BUN 62*   < > 44*   < > 35* 37* 43* 55* 50*  --   CREATININE 2.96*   < > 2.24*   < > 1.60* 1.57* 1.28* 1.87* 1.75*  --   CALCIUM 9.4   < > 8.8*   < > 8.8* 9.3 7.8* 9.0 8.8*  --   MG 1.8  --  2.0  --  1.9  --  1.8  --   --  2.3  PHOS 4.5   < > 2.8   < > 2.4* 2.5 1.8* 3.9 2.2*  --    < > = values in this interval not displayed.    GFR: Estimated Creatinine Clearance: 41.4 mL/min (A) (by C-G formula based on SCr of 1.75 mg/dL (H)). Recent Labs  Lab 02/02/23 0413 02/03/23 0520 02/03/23 0956 02/04/23 0400  WBC 8.9 12.6* 14.0* 13.3*     Liver Function Tests: Recent Labs  Lab 01/29/23 0702 01/29/23 2046 01/30/23 1612 01/31/23 0358 02/02/23 0413 02/02/23 1605 02/03/23 0520 02/03/23 1735 02/04/23 0305  AST 36  --  23  --   --   --  17  --   --   ALT 13  --  12  --   --   --  10  --   --   ALKPHOS 75  --  50  --   --   --  50  --   --   BILITOT 2.1*  --  4.3*  --   --   --  1.6*  --   --   PROT 7.1  --  5.8*  --   --   --  5.0*  --   --   ALBUMIN 3.7   < > 3.0*  3.0*   < > 2.6* 3.1* 2.3*  2.4* 2.8* 2.7*   < > = values in this interval not displayed.    No results for input(s): "LIPASE", "AMYLASE" in the last 168 hours. Recent Labs  Lab 02/03/23 0956  AMMONIA 45*    ABG    Component Value Date/Time   PHART 7.35 02/03/2023 1047   PCO2ART 49 (H) 02/03/2023 1047   PO2ART 91 02/03/2023 1047  HCO3 27.1 02/03/2023 1047   TCO2 31 02/15/2020 1034   O2SAT 98.9 02/03/2023 1047     Coagulation Profile: Recent Labs  Lab 01/29/23 1108  INR 1.3*     Cardiac Enzymes: No results for input(s): "CKTOTAL", "CKMB", "CKMBINDEX", "TROPONINI" in the last 168 hours.  HbA1C: Hgb A1c MFr Bld  Date/Time Value Ref Range Status  10/02/2022 11:16 PM 6.3 (H) 4.8 - 5.6 % Final    Comment:    (NOTE) Pre diabetes:          5.7%-6.4%  Diabetes:              >6.4%  Glycemic control for   <7.0% adults with diabetes     CBG: Recent  Labs  Lab 02/03/23 1608 02/03/23 1918 02/03/23 2311 02/04/23 0341 02/04/23 0744  GLUCAP 157* 167* 155* 158* 160*     Review of Systems:   Unable to assess due to AMS/intubation/sedation/Critical Illness  Past Medical History:  He,  has a past medical history of CHF (congestive heart failure) ([place]), Chronic kidney disease, COPD (chronic obstructive pulmonary disease) ([place]) (02/16/2020), Hyperlipidemia, Hypertension, and Pneumonia (2021).   Surgical History:   Past Surgical History:  Procedure Laterality Date   CATARACT EXTRACTION W/PHACO Left 10/31/2021   Procedure: CATARACT EXTRACTION PHACO AND INTRAOCULAR LENS PLACEMENT ([place]) LEFT VISION BLUE 3.45 00:48.0;  Surgeon: Leandrew Koyanagi, MD;  Location: [facility];  Service: Ophthalmology;  Laterality: Left;   CATARACT EXTRACTION W/PHACO Right 11/14/2021   Procedure: CATARACT EXTRACTION PHACO AND INTRAOCULAR LENS PLACEMENT ([facility]) RIGHT 8.59 01:10.5;  Surgeon: Leandrew Koyanagi, MD;  Location: [facility];  Service: Ophthalmology;  Laterality: Right;   INGUINAL HERNIA REPAIR Right 02/04/2017   Procedure: HERNIA REPAIR INGUINAL ADULT;  Surgeon: Leonie Green, MD;  Location: [facility];  Service: General;  Laterality: Right;   NO PAST SURGERIES     QUADRICEPS TENDON REPAIR Left 02/15/2020   Procedure: REPAIR QUADRICEP TENDON;  Surgeon: Corky Mull, MD;  Location: [facility];  Service: Orthopedics;  Laterality: Left;     Social History:   reports that he quit smoking about 7 years ago. His smoking use included cigarettes. He has a 13.25 pack-year smoking history. He has never used smokeless tobacco. He reports current alcohol use. He reports that he does not use drugs.   Family History:  His family history includes Diabetes in his brother, maternal grandmother, mother, and paternal grandmother.   Allergies No Known Allergies   Home Medications  Prior to Admission medications   Medication Sig Start Date End  Date Taking? Authorizing Provider  aspirin EC 81 MG tablet Take 1 tablet (81 mg total) by mouth daily. Swallow whole. 03/28/22  Yes Hackney, Otila Kluver A, FNP  calcitRIOL (ROCALTROL) 0.25 MCG capsule Take 0.25 mcg by mouth daily.   Yes [provider]  cephALEXin (KEFLEX) 500 MG capsule Take 500 mg by mouth 3 (three) times daily.   Yes [provider]  gabapentin (NEURONTIN) 100 MG capsule Take 1 capsule (100 mg total) by mouth 2 (two) times daily. 10/15/22  Yes [place] Eric J, DO  ipratropium-albuterol (DUONEB) 0.5-2.5 (3) MG/3ML SOLN Inhale 3 mLs into the lungs every 6 (six) hours as needed (wheezing).   Yes [provider]  losartan (COZAAR) 50 MG tablet Take 50 mg by mouth daily. 12/12/22  Yes [provider]  metolazone (ZAROXOLYN) 2.5 MG tablet Take 1 tablet (2.5 mg total) by mouth 2 (two) times a week. 10/15/22  Yes [place] Randall Hiss  J, DO  midodrine (PROAMATINE) 2.5 MG tablet Take 2.5 mg by mouth 3 (three) times daily. 01/01/23  Yes [provider]  omeprazole (PRILOSEC) 20 MG capsule Take 20 mg by mouth daily. 11/04/22 11/04/23 Yes [provider]  potassium chloride SA (KLOR-CON M) 20 MEQ tablet Take 1 tablet (20 mEq total) by mouth daily. With extra 66mq on M/ F with metolazone 01/28/23  Yes HDarylene PriceA, FNP  torsemide 60 MG TABS Take 60 mg by mouth daily. 11/27/22  Yes SSidney Ace MD     Critical care time: 40 minutes     JDarel Hong AGACNP-BC [facility] Prefer epic messenger for cross cover needs If after hours, please call E-link

## 2023-02-05 ENCOUNTER — Encounter: Payer: Medicare HMO | Admitting: Family

## 2023-02-05 DIAGNOSIS — I509 Heart failure, unspecified: Secondary | ICD-10-CM | POA: Diagnosis not present

## 2023-02-05 DIAGNOSIS — N179 Acute kidney failure, unspecified: Secondary | ICD-10-CM | POA: Diagnosis not present

## 2023-02-05 DIAGNOSIS — J9601 Acute respiratory failure with hypoxia: Secondary | ICD-10-CM

## 2023-02-05 DIAGNOSIS — R579 Shock, unspecified: Secondary | ICD-10-CM | POA: Diagnosis not present

## 2023-02-05 LAB — RENAL FUNCTION PANEL
Albumin: 2.8 g/dL — ABNORMAL LOW (ref 3.5–5.0)
Anion gap: 5 (ref 5–15)
BUN: 71 mg/dL — ABNORMAL HIGH (ref 8–23)
CO2: 28 mmol/L (ref 22–32)
Calcium: 9.2 mg/dL (ref 8.9–10.3)
Chloride: 104 mmol/L (ref 98–111)
Creatinine, Ser: 2.53 mg/dL — ABNORMAL HIGH (ref 0.61–1.24)
GFR, Estimated: 25 mL/min — ABNORMAL LOW (ref >60)
Glucose, Bld: 153 mg/dL — ABNORMAL HIGH (ref 70–99)
Phosphorus: 3.5 mg/dL (ref 2.5–4.6)
Potassium: 4 mmol/L (ref 3.5–5.1)
Sodium: 137 mmol/L (ref 135–145)

## 2023-02-05 LAB — GLUCOSE, CAPILLARY
Glucose-Capillary: 155 mg/dL — ABNORMAL HIGH (ref 70–99)
Glucose-Capillary: 134 mg/dL — ABNORMAL HIGH (ref 70–99)
Glucose-Capillary: 144 mg/dL — ABNORMAL HIGH (ref 70–99)
Glucose-Capillary: 154 mg/dL — ABNORMAL HIGH (ref 70–99)
Glucose-Capillary: 143 mg/dL — ABNORMAL HIGH (ref 70–99)
Glucose-Capillary: 138 mg/dL — ABNORMAL HIGH (ref 70–99)
Glucose-Capillary: 143 mg/dL — ABNORMAL HIGH (ref 70–99)
Glucose-Capillary: 122 mg/dL — ABNORMAL HIGH (ref 70–99)

## 2023-02-05 LAB — CBC
HCT: 42.7 % (ref 39.0–52.0)
Hemoglobin: 13.5 g/dL (ref 13.0–17.0)
MCH: 32.7 pg (ref 26.0–34.0)
MCHC: 31.6 g/dL (ref 30.0–36.0)
MCV: 103.4 fL — ABNORMAL HIGH (ref 80.0–100.0)
Platelets: 57 10*3/uL — ABNORMAL LOW (ref 150–400)
RBC: 4.13 MIL/uL — ABNORMAL LOW (ref 4.22–5.81)
RDW: 15.9 % — ABNORMAL HIGH (ref 11.5–15.5)
WBC: 11.9 10*3/uL — ABNORMAL HIGH (ref 4.0–10.5)
nRBC: 0 % (ref 0.0–0.2)

## 2023-02-05 LAB — MAGNESIUM: Magnesium: 2.5 mg/dL — ABNORMAL HIGH (ref 1.7–2.4)

## 2023-02-05 LAB — AMMONIA: Ammonia: 22 umol/L (ref 9–35)

## 2023-02-05 MED ORDER — FOLIC ACID 1 MG PO TABS
1.0000 mg | ORAL_TABLET | Freq: Every day | ORAL | Status: DC
  Administered 2023-02-07 – 2023-02-18 (×12): 1 mg via ORAL
  Filled 2023-02-05 (×12): qty 1

## 2023-02-05 MED ORDER — HEPARIN SODIUM (PORCINE) 1000 UNIT/ML IJ SOLN
INTRAMUSCULAR | Status: AC
  Administered 2023-02-05: 1000 [IU] via INTRAVENOUS
  Filled 2023-02-05: qty 5

## 2023-02-05 MED ORDER — RENA-VITE PO TABS: 1.0000 | ORAL_TABLET | Freq: Every day | ORAL | Status: DC

## 2023-02-05 MED ORDER — POLYETHYLENE GLYCOL 3350 17 G PO PACK: 17.0000 g | PACK | Freq: Two times a day (BID) | ORAL | Status: DC

## 2023-02-05 MED ORDER — DOCUSATE SODIUM 100 MG PO CAPS
100.0000 mg | ORAL_CAPSULE | Freq: Two times a day (BID) | ORAL | Status: DC
  Administered 2023-02-09 – 2023-02-18 (×18): 100 mg via ORAL
  Filled 2023-02-05 (×21): qty 1

## 2023-02-05 MED ORDER — CHLORHEXIDINE GLUCONATE CLOTH 2 % EX PADS: 6.0000 | MEDICATED_PAD | Freq: Every day | CUTANEOUS | Status: DC

## 2023-02-05 MED ORDER — HEPARIN SODIUM (PORCINE) 1000 UNIT/ML IJ SOLN: 1000.0000 [IU] | Freq: Once | INTRAMUSCULAR | Status: AC

## 2023-02-05 MED ORDER — ORAL CARE MOUTH RINSE: 15.0000 mL | OROMUCOSAL | Status: DC | PRN

## 2023-02-05 MED ORDER — ASPIRIN 81 MG PO CHEW
81.0000 mg | CHEWABLE_TABLET | Freq: Every day | ORAL | Status: DC
  Administered 2023-02-07 – 2023-02-18 (×12): 81 mg via ORAL
  Filled 2023-02-05 (×12): qty 1

## 2023-02-05 NOTE — Progress Notes (Signed)
1100 Extubated to 2 L N/C. Tolerated procedure well.

## 2023-02-05 NOTE — Progress Notes (Signed)
John [name] talked to nephrology. They discussed that the consent for emergency dialysis is still valid.

## 2023-02-05 NOTE — Progress Notes (Signed)
BFR reduced to 250 due to high arterial pressure. RN notified

## 2023-02-05 NOTE — Progress Notes (Signed)
PHARMACY CONSULT NOTE   Pharmacy Consult for Electrolyte Monitoring and Replacement   Recent Labs: Potassium (mmol/L)  Date Value  02/05/2023 4.0   Magnesium (mg/dL)  Date Value  02/05/2023 2.5 (H)   Calcium (mg/dL)  Date Value  02/05/2023 9.2   Albumin (g/dL)  Date Value  02/05/2023 2.8 (L)   Phosphorus (mg/dL)  Date Value  02/05/2023 3.5   Sodium (mmol/L)  Date Value  02/05/2023 137    Assessment: 77 year old male PMH HTN, CKD4, diastolic and systolic heart failure with cor pulmonale, COPD on 2L PTA, history ascending aortic aneurysm, CAD. Patient now off CRRT.  Goal of Therapy:  K > 4 Mg > 2 All other electrolytes within normal limits  Plan: Potassium: 4.0 >> no replacement needed Magnesium: 2.5 >> no replacement needed Phosphorus: 3.5 >> no replacement needed Check BMP, Mg, Phos with AM labs  Will M. Ouida Sills, PharmD PGY-1 Pharmacy Resident 02/05/2023 7:59 AM

## 2023-02-05 NOTE — Progress Notes (Addendum)
NAME:  John [name], MRN:  [id number], DOB:  01-11-1946, LOS: 7 ADMISSION DATE:  01/29/2023, CONSULTATION DATE:  01/29/2023 REFERRING MD:  Dr. Cheri Fowler, CHIEF COMPLAINT:  Altered Mental Status, worsening renal function   Brief Pt Description / Synopsis:  77 y.o. male admitted with shock (? Cardiogenic vs ? Septic), Acute Hypoxic Respiratory Failure in the setting of Acute Decompensated HFpEF, and AKI on CKD Stage IV requiring intubation and mechanical ventilation, along with initiation of CRRT.  History of Present Illness:  This is 77 year old male with a history of essential hypertension, on hydralazine and Cozaar, stage IV CKD but has not been on dialysis in the past, has chronic diastolic and systolic heart failure with cor pulmonale, moderate tricuspid regurgitation and aortic valve regurgitation, history of a ascending aortic aneurysm, coronary artery disease with atherosclerosis, COPD and chronic hypoxemia baseline 2 L/min supplemental oxygen at home with recurrent episodes of hypoxemia and remote acute exacerbation of COPD with hypoxemia and hypercapnia 2 months ago history of ruptured left quadricep tendon 2 years ago, chronic thrombocytopenia and erythrocytosis recurrent lower extremity cellulitis, recurrent hyperkalemia, morbid obesity history of anasarca dyslipidemia, recent admission for acute CHF exacerbation requiring Lasix drip and BiPAP support, who was brought in by EMS due to worsening altered mental status and circulatory shock. I was able to meet with wife at bedside during my evaluation who shares patient has been sleeping majority of each day over the last week has been on arousable and received phone call from medical provider regarding worsening kidney function which prompted ER evaluation. He had chest x-ray performed while in the ER showing bilateral pleural effusions with vascular congestion and atelectasis/infiltrate. Patient in circulatory shock with Levophed support on  admission in ER. PCCM consultation for admission to medical intensive care unit with circulatory shock consistent with acute on chronic renal failure with CHF exacerbation.   Please see "significant hospital events" section below for full detailed hospital course.  Pertinent  Medical History   Past Medical History:  Diagnosis Date   CHF (congestive heart failure) (HCC)    EF 45% in 2021   Chronic kidney disease    COPD (chronic obstructive pulmonary disease) ([place]) 02/16/2020   Hyperlipidemia    Hypertension    Pneumonia 2021    Micro Data:  2/14: Tracheal aspirate>> normal respiratory [place]ora 2/14: Blood culture x2>> negative 2/14: MRSA PCR>>negative 2/14: Strep pneumo & Legionella urinary antigens>> negative  Antimicrobials:   Anti-infectives (From admission, onward)    Start     Dose/Rate Route Frequency Ordered Stop   01/31/23 1700  ceFEPIme (MAXIPIME) 2 g in sodium chloride 0.9 % 100 mL IVPB  Status:  Discontinued        2 g 200 mL/hr over 30 Minutes Intravenous Every 12 hours 01/31/23 1555 02/03/23 1048   01/30/23 1700  vancomycin (VANCOREADY) IVPB 1250 mg/250 mL  Status:  Discontinued        1,250 mg 166.7 mL/hr over 90 Minutes Intravenous Every 24 hours 01/29/23 1543 01/30/23 1036   01/30/23 1300  Ampicillin-Sulbactam (UNASYN) 3 g in sodium chloride 0.9 % 100 mL IVPB  Status:  Discontinued        3 g 200 mL/hr over 30 Minutes Intravenous Every 8 hours 01/30/23 1036 01/31/23 1118   01/30/23 0000  vancomycin variable dose per unstable renal function (pharmacist dosing)  Status:  Discontinued         Does not apply See admin instructions 01/29/23 1125 01/29/23 1548   01/29/23 2200  piperacillin-tazobactam (ZOSYN) IVPB 3.375 g  Status:  Discontinued        3.375 g 12.5 mL/hr over 240 Minutes Intravenous Every 6 hours 01/29/23 1547 01/29/23 1548   01/29/23 2200  piperacillin-tazobactam (ZOSYN) IVPB 3.375 g  Status:  Discontinued        3.375 g 100 mL/hr over 30 Minutes  Intravenous Every 6 hours 01/29/23 1548 01/30/23 1036   01/29/23 2100  piperacillin-tazobactam (ZOSYN) IVPB 2.25 g  Status:  Discontinued        2.25 g 100 mL/hr over 30 Minutes Intravenous Every 8 hours 01/29/23 1125 01/29/23 1547   01/29/23 1115  piperacillin-tazobactam (ZOSYN) IVPB 3.375 g        3.375 g 100 mL/hr over 30 Minutes Intravenous  Once 01/29/23 1103 01/29/23 1457   01/29/23 1115  vancomycin (VANCOREADY) IVPB 2000 mg/400 mL        2,000 mg 200 mL/hr over 120 Minutes Intravenous  Once 01/29/23 1104 01/29/23 1656        Significant Hospital Events: Including procedures, antibiotic start and stop dates in addition to other pertinent events   01/30/23- I met with girlfriend of patient today to review short term medical plan.  He had Echo whith RV dysfunction and concern for PE.  We have started CRRT yesterday with additional interval improvement. He will have CTPE today.  He is weaned down on pressors today on vasor 0.4 and 4 of levophed reduced from 36 this morning.  01/31/23- remains agitated intermittently overnight. Continued issues with clot burden through CRRT circuit. 02/01/23- remains agitated overnight. Requiring numerous ativan pushes. Attempting to wean down sedation. Failed SBT miserably. 02/02/23- issues with CRRT overnight. Will attempt higher UF today. Assess for possible chest tube placement.  02/03/23- No issues with CRRT overnight, currently running without complication.  CT Head obtained overnight and negative for acute process.  Off Precedex this morning, currently somnolent, mental status currently precluding extubation. Will perform SBT as mental status permits.  Stop Cefepime, checking ammonia. 02/04/23- Remains on CRRT. Pt more awake and tracking.  Tolerated SBT for 12 hrs  02/05/23- Pt remains mechanically intubated on minimal vent settings.  Pt able to track and open eyes on command, however unable to move BLE and BLE   Interim History / Subjective:  Mentation  continues to preclude extubation.  MRI Brain pending.  Requiring levophed gtt @2$  mcg/min   Objective   Blood pressure 101/76, pulse 66, temperature 98.6 F (37 C), temperature source Esophageal, resp. rate (!) 22, height 5' 11"$  (1.803 m), weight 72.5 kg, SpO2 98 %.    Vent Mode: PSV;CPAP FiO2 (%):  [30 %-40 %] 30 % Set Rate:  [16 bmp] 16 bmp Vt Set:  [500 mL] 500 mL PEEP:  [5 cmH20] 5 cmH20 Pressure Support:  [8 cmH20-10 cmH20] 10 cmH20   Intake/Output Summary (Last 24 hours) at 02/05/2023 [id number] Last data filed at 02/05/2023 0700 Gross per 24 hour  Intake 1010.77 ml  Output 860 ml  Net 150.77 ml   Filed Weights   02/04/23 0945 02/05/23 0721  Weight: 93.7 kg 72.5 kg    Examination: General: Acute on chronically ill-appearing male, laying in bed, intubated and sedated, no acute distress HENT: Atraumatic, normocephalic, neck supple, no JVD Lungs: Coarse breath sounds throughout, even, non labored  Cardiovascular: Sinus bradycardia, s1s2, no m/r/g, 2+ radial/1+ distal pulses, 2+ BLE edema  Abdomen: Soft, nontender, non distended, +BS x4 Extremities: Normal bulk and tone, no deformities, chronic stasis dermatitis with 2+ edema  bilaterally Neuro: Tracking and open eyes to stimulation, unable to follow BLE or BUE commands, pupils PERRL Skin: right midback see below    GU: Foley catheter in place draining scant amount of urine  Resolved Hospital Problem list   Cardiogenic vs. Septic shock   Assessment & Plan:   #Acute Hypoxic Respiratory Failure in setting of Pulmonary Edema, Pulmonary Hypertension, & small bilateral Pleural Effusions PMHx: COPD CTA Chest 01/30/23: negative for PE, Enlarged central pulmonary arteries compatible with pulmonary arterial hypertension, Small to moderate-sized right pleural effusion and small left pleural effusion. - Full vent support, implement lung protective strategies - Plateau pressures less than 30 cm H20 - Wean FiO2 & PEEP as tolerated to  maintain O2 sats >92% - Follow intermittent Chest X-ray & ABG as needed - Spontaneous Breathing Trials when respiratory parameters met and mental status permits - Implement VAP Bundle - Prn Bronchodilators - Volume removal with HD  #Acute Decompensated HFpEF & Right sided HF #Hypotension likely now secondary to sedating medication  #Moderate Tricuspid & Aortic Regurgitation #Elevated Troponin in setting of NSTEMI vs. demand ischemia Echocardiogram 01/30/23: LVEF 50055%, Grade I DD, severely reduced RV systolic function, mild to moderate TR & AR - Vasopressors as needed to maintain MAP >65  - Wean stress dose steroids as able  - Lactic acid is normalized - HS Troponin peaked at 2351 - Cardiology following, appreciate input - Volume removal with HD  #AKI on CKD Stage IV - Trend BMP  - Replace electrolytes as indicated  - Monitor UOP  - Nephrology consulted appreciate input: HD per recommendations; if pt continues to require dialysis will need vascular consult for permcath placement   #Thrombocytopenia, suspect due to Uremia superimposed on chronic thrombocytopenia #HIT~RULED OUT - Trend CBC  - Monitor for s/sx of bleeding  - Transfuse for hgb <7   #Leukocytosis, previous fevers~IMPROVED - Trend WBC and monitor fever curve  - Completed 6 day course of empiric abx; no indication to restart abx therapy at this time   #Acute metabolic encephalopathy vs possible hepatic encephalopathy~slowly improving  #Sedation needs in setting of mechanical ventilation CT Head 02/02/23: negative for acute intracranial abnormality - Treatment of metabolic derangements as outlined above - Maintain a RASS goal of 0  - Avoid sedating medications as able - Daily wake up assessment - Repeat ammonia level  - MRI Brain pending   Best Practice (right click and "Reselect all SmartList Selections" daily)   Diet/type: tubefeeds and NPO DVT prophylaxis: SCD GI prophylaxis: PPI Lines: Left femoral HD  catheter, and is still needed Foley:  Yes, and it is still needed Code Status:  full code Last date of multidisciplinary goals of care discussion [02/05/23]  2/21: Will update pt's significant other when she arrives at bedside Labs   CBC: Recent Labs  Lab 02/02/23 0413 02/03/23 0520 02/03/23 0956 02/04/23 0400 02/05/23 0411  WBC 8.9 12.6* 14.0* 13.3* 11.9*  NEUTROABS  --   --  11.9*  --   --   HGB 14.0 13.2 14.6 13.6 13.5  HCT 45.6 42.7 46.4 43.0 42.7  MCV 104.8* 104.4* 103.1* 101.7* 103.4*  PLT 32* 38* 38* 38* 57*    Basic Metabolic Panel: Recent Labs  Lab 02/01/23 0500 02/01/23 1702 02/02/23 0413 02/02/23 1605 02/03/23 0520 02/03/23 1735 02/04/23 0305 02/04/23 0400 02/05/23 0411  NA 133*   < > 135 133* 136 134* 135  --  137  K 3.5   < > 3.7 3.4* 2.9* 4.0 3.5  --  4.0  CL 101   < > 105 102 108 102 103  --  104  CO2 25   < > 26 25 24 26 26  $ --  28  GLUCOSE 167*   < > 187* 135* 143* 153* 168*  --  153*  BUN 44*   < > 35* 37* 43* 55* 50*  --  71*  CREATININE 2.24*   < > 1.60* 1.57* 1.28* 1.87* 1.75*  --  2.53*  CALCIUM 8.8*   < > 8.8* 9.3 7.8* 9.0 8.8*  --  9.2  MG 2.0  --  1.9  --  1.8  --   --  2.3 2.5*  PHOS 2.8   < > 2.4* 2.5 1.8* 3.9 2.2*  --  3.5   < > = values in this interval not displayed.   GFR: Estimated Creatinine Clearance: 25.1 mL/min (A) (by C-G formula based on SCr of 2.53 mg/dL (H)). Recent Labs  Lab 02/03/23 0520 02/03/23 0956 02/04/23 0400 02/05/23 0411  WBC 12.6* 14.0* 13.3* 11.9*    Liver Function Tests: Recent Labs  Lab 01/30/23 1612 01/31/23 0358 02/02/23 1605 02/03/23 0520 02/03/23 1735 02/04/23 0305 02/05/23 0411  AST 23  --   --  17  --   --   --   ALT 12  --   --  10  --   --   --   ALKPHOS 50  --   --  50  --   --   --   BILITOT 4.3*  --   --  1.6*  --   --   --   PROT 5.8*  --   --  5.0*  --   --   --   ALBUMIN 3.0*  3.0*   < > 3.1* 2.3*  2.4* 2.8* 2.7* 2.8*   < > = values in this interval not displayed.   No  results for input(s): "LIPASE", "AMYLASE" in the last 168 hours. Recent Labs  Lab 02/03/23 0956  AMMONIA 45*    ABG    Component Value Date/Time   PHART 7.35 02/03/2023 1047   PCO2ART 49 (H) 02/03/2023 1047   PO2ART 91 02/03/2023 1047   HCO3 27.1 02/03/2023 1047   TCO2 31 02/15/2020 1034   O2SAT 98.9 02/03/2023 1047     Coagulation Profile: Recent Labs  Lab 01/29/23 1108  INR 1.3*    Cardiac Enzymes: No results for input(s): "CKTOTAL", "CKMB", "CKMBINDEX", "TROPONINI" in the last 168 hours.  HbA1C: Hgb A1c MFr Bld  Date/Time Value Ref Range Status  10/02/2022 11:16 PM 6.3 (H) 4.8 - 5.6 % Final    Comment:    (NOTE) Pre diabetes:          5.7%-6.4%  Diabetes:              >6.4%  Glycemic control for   <7.0% adults with diabetes     CBG: Recent Labs  Lab 02/04/23 1647 02/04/23 1931 02/04/23 2310 02/05/23 0338 02/05/23 0741  GLUCAP 155* 139* 134* 144* 154*    Review of Systems:   Unable to assess due to AMS/intubation/sedation/Critical Illness  Past Medical History:  He,  has a past medical history of CHF (congestive heart failure) ([place]), Chronic kidney disease, COPD (chronic obstructive pulmonary disease) ([place]) (02/16/2020), Hyperlipidemia, Hypertension, and Pneumonia (2021).   Surgical History:   Past Surgical History:  Procedure Laterality Date   CATARACT EXTRACTION W/PHACO Left 10/31/2021   Procedure: CATARACT EXTRACTION PHACO AND INTRAOCULAR  LENS PLACEMENT (IOC) LEFT VISION BLUE 3.45 00:48.0;  Surgeon: Leandrew Koyanagi, MD;  Location: [facility];  Service: Ophthalmology;  Laterality: Left;   CATARACT EXTRACTION W/PHACO Right 11/14/2021   Procedure: CATARACT EXTRACTION PHACO AND INTRAOCULAR LENS PLACEMENT (IOC) RIGHT 8.59 01:10.5;  Surgeon: Leandrew Koyanagi, MD;  Location: [facility];  Service: Ophthalmology;  Laterality: Right;   INGUINAL HERNIA REPAIR Right 02/04/2017   Procedure: HERNIA REPAIR INGUINAL ADULT;  Surgeon:  Leonie Green, MD;  Location: [facility];  Service: General;  Laterality: Right;   NO PAST SURGERIES     QUADRICEPS TENDON REPAIR Left 02/15/2020   Procedure: REPAIR QUADRICEP TENDON;  Surgeon: Corky Mull, MD;  Location: [facility];  Service: Orthopedics;  Laterality: Left;     Social History:   reports that he quit smoking about 7 years ago. His smoking use included cigarettes. He has a 13.25 pack-year smoking history. He has never used smokeless tobacco. He reports current alcohol use. He reports that he does not use drugs.   Family History:  His family history includes Diabetes in his brother, maternal grandmother, mother, and paternal grandmother.   Allergies No Known Allergies   Home Medications  Prior to Admission medications   Medication Sig Start Date End Date Taking? Authorizing Provider  aspirin EC 81 MG tablet Take 1 tablet (81 mg total) by mouth daily. Swallow whole. 03/28/22  Yes Hackney, Otila Kluver A, FNP  calcitRIOL (ROCALTROL) 0.25 MCG capsule Take 0.25 mcg by mouth daily.   Yes [provider]  cephALEXin (KEFLEX) 500 MG capsule Take 500 mg by mouth 3 (three) times daily.   Yes [provider]  gabapentin (NEURONTIN) 100 MG capsule Take 1 capsule (100 mg total) by mouth 2 (two) times daily. 10/15/22  Yes [place] Eric J, DO  ipratropium-albuterol (DUONEB) 0.5-2.5 (3) MG/3ML SOLN Inhale 3 mLs into the lungs every 6 (six) hours as needed (wheezing).   Yes [provider]  losartan (COZAAR) 50 MG tablet Take 50 mg by mouth daily. 12/12/22  Yes [provider]  metolazone (ZAROXOLYN) 2.5 MG tablet Take 1 tablet (2.5 mg total) by mouth 2 (two) times a week. 10/15/22  Yes [place] Eric J, DO  midodrine (PROAMATINE) 2.5 MG tablet Take 2.5 mg by mouth 3 (three) times daily. 01/01/23  Yes [provider]  omeprazole (PRILOSEC) 20 MG capsule Take 20 mg by mouth daily. 11/04/22 11/04/23 Yes [provider]  potassium chloride SA  (KLOR-CON M) 20 MEQ tablet Take 1 tablet (20 mEq total) by mouth daily. With extra 63mq on M/ F with metolazone 01/28/23  Yes HDarylene PriceA, FNP  torsemide 60 MG TABS Take 60 mg by mouth daily. 11/27/22  Yes SSidney Ace MD     Critical care time: 359minutes     DDonell Beers ANorth Terre HautePager [phone](please enter 7 digits) PCCM Consult Pager [phone](please enter 7 digits)

## 2023-02-05 NOTE — Progress Notes (Signed)
[name]  ROUNDING NOTE   Subjective:   [name] was admitted to [facility] on 01/29/2023 for Peripheral edema [R60.9] Shock circulatory ([place]) [R57.9] Acute on chronic congestive heart failure, unspecified heart failure type ([place]) [I50.9] Acute renal failure superimposed on chronic kidney disease, unspecified acute renal failure type, unspecified CKD stage ([place]) [N17.9, N18.9]  Patient seen and evaluated at bedside in ICU Remains on vent, 30% Levo remains Sedation weaned.  Arousable  Patient seen later in morning with attending Extubated, states he feels fine  Objective:  Vital signs in last 24 hours:  Temp:  [98.1 F (36.7 C)-99.1 F (37.3 C)] 98.8 F (37.1 C) (02/21 1000) Pulse Rate:  [59-109] 78 (02/21 1100) Resp:  [11-27] 19 (02/21 1100) BP: (75-125)/(53-96) 99/68 (02/21 1100) SpO2:  [92 %-100 %] 98 % (02/21 1100) FiO2 (%):  [30 %-40 %] 30 % (02/21 1053) Weight:  [72.5 kg] 72.5 kg (02/21 0721)  Weight change:  Filed Weights   02/04/23 0945 02/05/23 0721  Weight: 93.7 kg 72.5 kg    Intake/Output: I/O last 3 completed shifts: In: 1821.3 [I.V.:166.3; NG/GT:1655] Out: 4320.5 [id number]   Intake/Output this shift:  Total I/O In: 376.7 [I.V.:38.7; NG/GT:338] Out: 175 [Urine:175]  Physical Exam: General: Critically ill  Head: ETT  Eyes: Anicteric  Neck: Supple  Lungs:  Pressure Support FiO2 40%  Heart: regular  Abdomen:  Soft  Extremities: + dependent edema.    Neurologic: Intubated and sedated  Skin: Left lower leg wound in clean and dry dressing  Access: Left femoral Temp HD catheter placed 2/14.     Basic Metabolic Panel: Recent Labs  Lab 02/01/23 0500 02/01/23 1702 02/02/23 0413 02/02/23 1605 02/03/23 0520 02/03/23 1735 02/04/23 0305 02/04/23 0400 02/05/23 0411  NA 133*   < > 135 133* 136 134* 135  --  137  K 3.5   < > 3.7 3.4* 2.9* 4.0 3.5  --  4.0  CL 101   < > 105 102 108 102 103  --  104  CO2 25   < > 26 25 24 26  26  $ --  28  GLUCOSE 167*   < > 187* 135* 143* 153* 168*  --  153*  BUN 44*   < > 35* 37* 43* 55* 50*  --  71*  CREATININE 2.24*   < > 1.60* 1.57* 1.28* 1.87* 1.75*  --  2.53*  CALCIUM 8.8*   < > 8.8* 9.3 7.8* 9.0 8.8*  --  9.2  MG 2.0  --  1.9  --  1.8  --   --  2.3 2.5*  PHOS 2.8   < > 2.4* 2.5 1.8* 3.9 2.2*  --  3.5   < > = values in this interval not displayed.     Liver Function Tests: Recent Labs  Lab 01/30/23 1612 01/31/23 0358 02/02/23 1605 02/03/23 0520 02/03/23 1735 02/04/23 0305 02/05/23 0411  AST 23  --   --  17  --   --   --   ALT 12  --   --  10  --   --   --   ALKPHOS 50  --   --  50  --   --   --   BILITOT 4.3*  --   --  1.6*  --   --   --   PROT 5.8*  --   --  5.0*  --   --   --   ALBUMIN 3.0*  3.0*   < >  3.1* 2.3*  2.4* 2.8* 2.7* 2.8*   < > = values in this interval not displayed.    No results for input(s): "LIPASE", "AMYLASE" in the last 168 hours. Recent Labs  Lab 02/03/23 0956 02/05/23 1021  AMMONIA 45* 22     CBC: Recent Labs  Lab 02/02/23 0413 02/03/23 0520 02/03/23 0956 02/04/23 0400 02/05/23 0411  WBC 8.9 12.6* 14.0* 13.3* 11.9*  NEUTROABS  --   --  11.9*  --   --   HGB 14.0 13.2 14.6 13.6 13.5  HCT 45.6 42.7 46.4 43.0 42.7  MCV 104.8* 104.4* 103.1* 101.7* 103.4*  PLT 32* 38* 38* 38* 57*     Cardiac Enzymes: No results for input(s): "CKTOTAL", "CKMB", "CKMBINDEX", "TROPONINI" in the last 168 hours.  BNP: Invalid input(s): "POCBNP"  CBG: Recent Labs  Lab 02/04/23 1931 02/04/23 2310 02/05/23 0338 02/05/23 0741 02/05/23 1133  GLUCAP 139* 134* 144* 154* 143*     Microbiology: Results for orders placed or performed during the hospital encounter of 01/29/23  Culture, Respiratory w Gram Stain     Status: None   Collection Time: 01/29/23  3:45 AM   Specimen: Tracheal Aspirate; Respiratory  Result Value Ref Range Status   Specimen Description   Final    TRACHEAL ASPIRATE Performed at [facility], [address] [place], [place] [address]    Special Requests   Final    NONE Performed at [facility], [address] [place], [place] [address]    Gram Stain   Final    MODERATE WBC PRESENT,BOTH PMN AND MONONUCLEAR NO ORGANISMS SEEN    Culture   Final    RARE Normal respiratory flora-no Staph aureus or Pseudomonas seen Performed at [place] [address] [place], [place] [address]    Report Status 02/01/2023 FINAL  Final  Culture, blood (Routine X 2) w Reflex to ID Panel     Status: None   Collection Time: 01/29/23  1:10 PM   Specimen: BLOOD  Result Value Ref Range Status   Specimen Description BLOOD BLOOD LEFT ARM  Final   Special Requests   Final    BOTTLES DRAWN AEROBIC AND ANAEROBIC Blood Culture adequate volume   Culture   Final    NO GROWTH 5 DAYS Performed at [facility], [address], [place], [place] [address]    Report Status 02/03/2023 FINAL  Final  MRSA Next Gen by PCR, Nasal     Status: None   Collection Time: 01/29/23  1:47 PM   Specimen: Nasal Mucosa; Nasal Swab  Result Value Ref Range Status   MRSA by PCR Next Gen NOT DETECTED NOT DETECTED Final    Comment: (NOTE) The GeneXpert MRSA Assay (FDA approved for NASAL specimens only), is one component of a comprehensive MRSA colonization surveillance program. It is not intended to diagnose MRSA infection nor to guide or monitor treatment for MRSA infections. Test performance is not FDA approved in patients less than 11 years old. Performed at [facility], [address] [place], [place] [address]   Culture, blood (Routine X 2) w Reflex to ID Panel     Status: None   Collection Time: 01/29/23  2:10 PM   Specimen: BLOOD  Result Value Ref Range Status   Specimen Description BLOOD A-LINE  Final   Special Requests   Final    BOTTLES DRAWN AEROBIC AND ANAEROBIC Blood Culture adequate volume   Culture   Final  NO GROWTH 5 DAYS Performed at [facility],  [address] [place], [place] [address]    Report Status 02/03/2023 FINAL  Final    Coagulation Studies: No results for input(s): "LABPROT", "INR" in the last 72 hours.   Urinalysis: No results for input(s): "COLORURINE", "LABSPEC", "PHURINE", "GLUCOSEU", "HGBUR", "BILIRUBINUR", "KETONESUR", "PROTEINUR", "UROBILINOGEN", "NITRITE", "LEUKOCYTESUR" in the last 72 hours.  Invalid input(s): "APPERANCEUR"     Imaging: DG Chest Port 1 View  Result Date: 02/04/2023 CLINICAL DATA:  Acute respiratory failure with hypoxia. EXAM: PORTABLE CHEST 1 VIEW COMPARISON:  Chest radiograph February 01, 2023. FINDINGS: Endotracheal tube with tip in the midthoracic trachea 4.8 cm from the carina. Esophageal temperature probe in place. Nasogastric tube courses below the diaphragm with tip obscured by collimation. Additional cardiac monitoring leads project over the chest. Normal size heart. Aortic atherosclerosis. Apparent widening of the mediastinal vascular pedicle is favored secondary to rotation. Layering small bilateral pleural effusions with persistent right-greater-than-left airspace opacitie. No acute osseous abnormality IMPRESSION: 1. Stable layering small bilateral pleural effusions with persistent right-greater-than-left airspace opacities. Electronically Signed   By: Dahlia Bailiff M.D.   On: 02/04/2023 08:24   DG Abd 1 View  Result Date: 02/03/2023 CLINICAL DATA:  Vomiting EXAM: ABDOMEN - 1 VIEW COMPARISON:  01/30/2023 FINDINGS: Nasogastric tube noted with tip and side port in the stomach body. Gas fills the stomach. Mild scarring or atelectasis at the left lung base. A catheter projects over the left groin and extends over into the right abdomen, presumably a venous catheter. There is also a small amount of tubing projecting over the right hip. There is evidence of lumbar spondylosis and degenerative disc disease. Unremarkable bowel gas pattern. IMPRESSION: 1. Nasogastric tube tip and side port are  in the stomach body. 2. Mild scarring or atelectasis at the left lung base. 3. Unremarkable bowel gas pattern. Electronically Signed   By: Van Clines M.D.   On: 02/03/2023 15:26     Medications:    sodium chloride 5 mL/hr at 02/02/23 [id number]   norepinephrine (LEVOPHED) Adult infusion 3 mcg/min (02/05/23 0900)    aspirin  81 mg Per Tube Daily   Chlorhexidine Gluconate Cloth  6 each Topical Daily   [START ON 02/06/2023] Chlorhexidine Gluconate Cloth  6 each Topical Q0600   docusate  100 mg Per Tube BID   folic acid  1 mg Per Tube Daily   free water  30 mL Per Tube Q4H   hydrocortisone sod succinate (SOLU-CORTEF) inj  50 mg Intravenous Q12H   multivitamin  1 tablet Per Tube QHS   mouth rinse  15 mL Mouth Rinse Q2H   pantoprazole (PROTONIX) IV  40 mg Intravenous Daily   polyethylene glycol  17 g Per Tube BID   thiamine (VITAMIN B1) injection  100 mg Intravenous Daily   docusate sodium, mouth rinse, polyethylene glycol  Assessment/ Plan:  [name] is a 77 y.o. black male with chronic diastolic congestive heart failure, hypertension, COPD, hyperlipidemia, who is admitted to [facility] on 01/29/2023 for Peripheral edema [R60.9] Shock circulatory ([place]) [R57.9] Acute on chronic congestive heart failure, unspecified heart failure type ([place]) [I50.9] Acute renal failure superimposed on chronic kidney disease, unspecified acute renal failure type, unspecified CKD stage ([place]) [N17.9, N18.9]  Acute Kidney Injury on chronic kidney disease stage IV: baseline creatinine of 2.67, GFR of 24 on 01/06/23. History of bland urine. Acute kidney injury most likely secondary to cardiorenal syndrome. Chronic kidney disease secondary to hypertension.  Creatinine  elevated with improved UOP, 972m in past 24 hours.  Renal function borderline, was planning Furosemide challenge however blood pressure soft. Will provide shortened intermittent dialysis treatment today, no UF.  Will discuss with patient tomorrow  his wishes to continue dialysis, if needed. If further dialysis is needed, patient will need Permcath placed.   Acute exacerbation of chronic diastolic congestive heart failure and acute respiratory failure requiring intubation and mechanical ventilation.   Extubated on 02/05/23  Hypotension with cardiogenic shock requiring vasopressors:  Remains on Levo, may require increase during dialysis.    LOS: 7[place]2/21/202411:55 AM

## 2023-02-05 NOTE — Procedures (Signed)
Patient Catheter is not working well. Running at 200 BFR but still arterial pressure is at 270-300. tried flushing lines and repositioned but failed. Called Dr. Juleen China. He ordered to stop the treatment for now and will assess the patient tomorrow.

## 2023-02-05 NOTE — Progress Notes (Signed)
[place] NOTE       Patient ID: John [name] MRN: [id number] DOB/AGE: 04/23/1946 77 y.o.  Admit date: 01/29/2023 Referring Physician Darel Hong, NP  Primary Physician Dr. Ginette Pitman Primary Cardiologist Dr. Nehemiah Massed  Reason for Consultation elevated troponin / AoCHF  HPI: Savien Hrdlicka is a 39yoM with a PMH of HFpEF (50-55%, G1 DD with a D-shaped LV c/w RV overload, mi-mod TR 10/02/2022), COPD (prn 2 L), CKD 4, chronic thrombocytopenia, HTN, hx tobacco use who presented to [facility] ED 01/28/2023 at the request of his nephrologist after outpatient labs showed acutely worsening renal function.  He was brought in by EMS with altered mental status and in circulatory shock requiring vasopressors, CRRT for acute renal failure, and respiratory failure needing mechanical intubation. Troponins checked on admission were elevated and trended 2351, 2252, and 1824. BNP 2500. Echo this admission redemonstrates preserved LVEF 50-55%, RV failure with severely elevated RVSP (62.106mHg), mod RA dilation, mi-mod TR and MR. Cardiology is consulted on hospital day 5 for assistance with his heart failure.   Interval History:  - no acute events  - remains intubated, off sedation. SBT again today - on low dose NE - CRRT/HD per nephrology  Review of systems limited by patient's mental status  Past Medical History:  Diagnosis Date   CHF (congestive heart failure) (HCC)    EF 45% in 2021   Chronic kidney disease    [place]   Hyperlipidemia    Hypertension    Pneumonia 2021    Past Surgical History:  Procedure Laterality Date   CATARACT EXTRACTION W/PHACO Left 10/31/2021   Procedure: CATARACT EXTRACTION PHACO AND INTRAOCULAR LENS PLACEMENT ([place] LEFT VISION BLUE 3.45 00:48.0;  Surgeon: BLeandrew Koyanagi MD;  [place]  Service: Ophthalmology;  Laterality: Left;   CATARACT EXTRACTION W/PHACO Right 11/14/2021    Procedure: CATARACT EXTRACTION PHACO AND INTRAOCULAR LENS PLACEMENT (I[facility]) RIGHT 8.59 01:10.5;  Surgeon: BLeandrew Koyanagi MD;  [place]  Service: Ophthalmology;  Laterality: Right;   INGUINAL HERNIA REPAIR Right 02/04/2017   Procedure: HERNIA REPAIR INGUINAL ADULT;  Surgeon: JLeonie Green MD;  Location: [facility] ORS;  Service: General;  Laterality: Right;   NO PAST SURGERIES     QUADRICEPS TENDON REPAIR Left 02/15/2020   Procedure: REPAIR QUADRICEP TENDON;  Surgeon: PCorky Mull MD;  Location: [facility] ORS;  Service: Orthopedics;  Laterality: Left;    Medications Prior to Admission  Medication Sig Dispense Refill Last Dose   aspirin EC 81 MG tablet Take 1 tablet (81 mg total) by mouth daily. Swallow whole. 90 tablet 3 01/28/2023   calcitRIOL (ROCALTROL) 0.25 MCG capsule Take 0.25 mcg by mouth daily.   01/28/2023   cephALEXin (KEFLEX) 500 MG capsule Take 500 mg by mouth 3 (three) times daily.   01/28/2023   gabapentin (NEURONTIN) 100 MG capsule Take 1 capsule (100 mg total) by mouth 2 (two) times daily.   01/28/2023   ipratropium-albuterol (DUONEB) 0.5-2.5 (3) MG/3ML SOLN Inhale 3 mLs into the lungs every 6 (six) hours as needed (wheezing).   unknown   losartan (COZAAR) 50 MG tablet Take 50 mg by mouth daily.   01/28/2023   metolazone (ZAROXOLYN) 2.5 MG tablet Take 1 tablet (2.5 mg total) by mouth 2 (two) times a week.   Past Week   midodrine (PROAMATINE) 2.5 MG tablet Take 2.5 mg by mouth 3 (three) times daily.   01/28/2023   omeprazole (PRILOSEC) 20 MG capsule  Take 20 mg by mouth daily.   01/28/2023   potassium chloride SA (KLOR-CON M) 20 MEQ tablet Take 1 tablet (20 mEq total) by mouth daily. With extra 40mq on M/ F with metolazone 100 tablet 3 Past Week   torsemide 60 MG TABS Take 60 mg by mouth daily. 30 tablet 1 01/28/2023   Social History   Socioeconomic History   Marital status: Single    Spouse name: Not on file   Number of children: Not on file   Years of  education: Not on file   Highest education level: Not on file  Occupational History   Not on file  Tobacco Use   Smoking status: Former    Packs/day: 0.25    Years: 53.00    Total pack years: 13.25    Types: Cigarettes    Quit date: 02/15/2015    Years since quitting: 7.9   Smokeless tobacco: Never  Substance and Sexual Activity   Alcohol use: Yes    Comment: occassional   Drug use: No   Sexual activity: Not on file  Other Topics Concern   Not on file  Social History Narrative   Not on file   Social Determinants of Health   Financial Resource Strain: Low Risk  (07/17/2022)   Overall Financial Resource Strain (CARDIA)    Difficulty of Paying Living Expenses: Not hard at all  Food Insecurity: No Food Insecurity (01/29/2023)   Hunger Vital Sign    Worried About Running Out of Food in the Last Year: Never true    Ran Out of Food in the Last Year: Never true  Transportation Needs: No Transportation Needs (01/29/2023)   PRAPARE - THydrologist(Medical): No    Lack of Transportation (Non-Medical): No  Physical Activity: Sufficiently Active (07/17/2022)   Exercise Vital Sign    Days of Exercise per Week: 5 days    Minutes of Exercise per Session: 60 min  Stress: No Stress Concern Present (07/17/2022)   [place]   Feeling of Stress : Not at all  Social Connections: [place](07/17/2022)   Social Connection and Isolation Panel [NHANES]    Frequency of Communication with Friends and Family: More than three times a week    Frequency of Social Gatherings with Friends and Family: More than three times a week    Attends Religious Services: 1 to 4 times per year    Active Member of [employer] Organizations: Yes    Attends CArchivistMeetings: More than 4 times per year    Marital Status: Living with partner  Intimate Partner Violence: Not At Risk (01/29/2023)   Humiliation,  Afraid, Rape, and Kick questionnaire    Fear of Current or Ex-Partner: No    Emotionally Abused: No    Physically Abused: No    Sexually Abused: No    Family History  Problem Relation Age of Onset   Diabetes Mother    Diabetes Brother    Diabetes Maternal Grandmother    Diabetes Paternal Grandmother       Intake/Output Summary (Last 24 hours) at 02/05/2023 0820 Last data filed at 02/05/2023 0700 Gross per 24 hour  Intake 1010.77 ml  Output 860 ml  Net 150.77 ml     Vitals:   02/05/23 0500 02/05/23 0721 02/05/23 0737 02/05/23 0746  BP: (!) 125/92   101/76  Pulse: 67   66  Resp: (!) 25   (!)  22  Temp: 98.1 F (36.7 C)   98.6 F (37 C)  TempSrc:    Esophageal  SpO2: 97%  99% 98%  Weight:  72.5 kg    Height:        PHYSICAL EXAM General: Acute on chronically ill-appearing black male, intubated. Blinks eyes open to voice HEENT:  Normocephalic and atraumatic. Neck:  No JVD.  Lungs: mechanically ventilated breath sounds present bilaterally, coarse to ausculation anteriorly.  Heart: regular rate and rhythm. Normal S1 and S2 without gallops or murmurs.  Abdomen: distended Msk: does not move extremities spontaneously  Extremities: No clubbing, cyanosis. Trace bilateral peripheral edema, chronic hyperpigmentation + skin wrinkling/dimpling Neuro: moves head and blinks eyes open to voice. Psych:  unable to assess  Labs: Basic Metabolic Panel: Recent Labs    02/04/23 0305 02/04/23 0400 02/05/23 0411  NA 135  --  137  K 3.5  --  4.0  CL 103  --  104  CO2 26  --  28  GLUCOSE 168*  --  153*  BUN 50*  --  71*  CREATININE 1.75*  --  2.53*  CALCIUM 8.8*  --  9.2  MG  --  2.3 2.5*  PHOS 2.2*  --  3.5    Liver Function Tests: Recent Labs    02/03/23 0520 02/03/23 1735 02/04/23 0305 02/05/23 0411  AST 17  --   --   --   ALT 10  --   --   --   ALKPHOS 50  --   --   --   BILITOT 1.6*  --   --   --   PROT 5.0*  --   --   --   ALBUMIN 2.3*  2.4*   < > 2.7* 2.8*    < > = values in this interval not displayed.    No results for input(s): "LIPASE", "AMYLASE" in the last 72 hours. CBC: Recent Labs    02/03/23 0956 02/04/23 0400 02/05/23 0411  WBC 14.0* 13.3* 11.9*  NEUTROABS 11.9*  --   --   HGB 14.6 13.6 13.5  HCT 46.4 43.0 42.7  MCV 103.1* 101.7* 103.4*  PLT 38* 38* 57*    Cardiac Enzymes: No results for input(s): "CKTOTAL", "CKMB", "CKMBINDEX", "TROPONINIHS" in the last 72 hours. BNP: Recent Labs    02/03/23 0956  BNP 1,706.8*    D-Dimer: No results for input(s): "DDIMER" in the last 72 hours. Hemoglobin A1C: No results for input(s): "HGBA1C" in the last 72 hours. Fasting Lipid Panel: No results for input(s): "CHOL", "HDL", "LDLCALC", "TRIG", "CHOLHDL", "LDLDIRECT" in the last 72 hours. Thyroid Function Tests: No results for input(s): "TSH", "T4TOTAL", "T3FREE", "THYROIDAB" in the last 72 hours.  Invalid input(s): "FREET3" Anemia Panel: No results for input(s): "VITAMINB12", "FOLATE", "FERRITIN", "TIBC", "IRON", "RETICCTPCT" in the last 72 hours.   Radiology: [facility] Chest Port 1 View  Result Date: 02/04/2023 CLINICAL DATA:  Acute respiratory failure with hypoxia. EXAM: PORTABLE CHEST 1 VIEW COMPARISON:  Chest radiograph February 01, 2023. FINDINGS: Endotracheal tube with tip in the midthoracic trachea 4.8 cm from the carina. Esophageal temperature probe in place. Nasogastric tube courses below the diaphragm with tip obscured by collimation. Additional cardiac monitoring leads project over the chest. Normal size heart. Aortic atherosclerosis. Apparent widening of the mediastinal vascular pedicle is favored secondary to rotation. Layering small bilateral pleural effusions with persistent right-greater-than-left airspace opacitie. No acute osseous abnormality IMPRESSION: 1. Stable layering small bilateral pleural effusions with persistent right-greater-than-left airspace opacities.  Electronically Signed   By: Dahlia Bailiff M.D.   On:  02/04/2023 08:24   DG Abd 1 View  Result Date: 02/03/2023 CLINICAL DATA:  Vomiting EXAM: ABDOMEN - 1 VIEW COMPARISON:  01/30/2023 FINDINGS: Nasogastric tube noted with tip and side port in the stomach body. Gas fills the stomach. Mild scarring or atelectasis at the left lung base. A catheter projects over the left groin and extends over into the right abdomen, presumably a venous catheter. There is also a small amount of tubing projecting over the right hip. There is evidence of lumbar spondylosis and degenerative disc disease. Unremarkable bowel gas pattern. IMPRESSION: 1. Nasogastric tube tip and side port are in the stomach body. 2. Mild scarring or atelectasis at the left lung base. 3. Unremarkable bowel gas pattern. Electronically Signed   By: Van Clines M.D.   On: 02/03/2023 15:26   CT HEAD WO CONTRAST (5MM)  Result Date: 02/03/2023 CLINICAL DATA:  Altered mental status EXAM: CT HEAD WITHOUT CONTRAST TECHNIQUE: Contiguous axial images were obtained from the base of the skull through the vertex without intravenous contrast. RADIATION DOSE REDUCTION: This exam was performed according to the departmental dose-optimization program which includes automated exposure control, adjustment of the mA and/or kV according to patient size and/or use of iterative reconstruction technique. COMPARISON:  None Available. FINDINGS: Brain: No evidence of acute infarction, hemorrhage, mass, mass effect, or midline shift. No hydrocephalus or extra-axial fluid collection. Partial empty sella. Normal craniocervical junction. Cerebral volume is within normal limits for age. Vascular: No hyperdense vessel. Skull: Negative for fracture or focal lesion. Sinuses/Orbits: Mucosal thickening in the left greater than right maxillary sinus. Other: Underpneumatized right mastoid, with trace fluid. The left mastoid air cells are well aerated. Endotracheal and orogastric tubes noted. IMPRESSION: No acute intracranial process.  Electronically Signed   By: Merilyn Baba M.D.   On: 02/03/2023 03:03   DG Chest Port 1 View  Result Date: 02/01/2023 CLINICAL DATA:  Dyspnea EXAM: PORTABLE CHEST 1 VIEW COMPARISON:  01/29/2023 FINDINGS: There is pulmonary vascular congestion. Bibasilar consolidation or volume loss. No pneumothorax. Possible small pleural effusions. Endotracheal tube tip 3 cm above carina. NG tube tip below the diaphragm and off x-ray. Mediastinal drain or probe overlies the mid thorax. IMPRESSION: Vascular congestion.  Bibasilar consolidation or volume loss. Electronically Signed   By: Sammie Bench M.D.   On: 02/01/2023 13:56   CT Angio Chest Pulmonary Embolism (PE) W or WO Contrast  Result Date: 01/30/2023 CLINICAL DATA:  Chronic hypoxemia and hypercapnia related to COPD, congestive heart failure and chronic renal failure. EXAM: CT ANGIOGRAPHY CHEST WITH CONTRAST TECHNIQUE: Multidetector CT imaging of the chest was performed using the standard protocol during bolus administration of intravenous contrast. Multiplanar CT image reconstructions and MIPs were obtained to evaluate the vascular anatomy. RADIATION DOSE REDUCTION: This exam was performed according to the departmental dose-optimization program which includes automated exposure control, adjustment of the mA and/or kV according to patient size and/or use of iterative reconstruction technique. CONTRAST:  25m OMNIPAQUE IOHEXOL 350 MG/ML SOLN COMPARISON:  Portable chest obtained yesterday. Chest, abdomen and pelvis CT dated 01/18/2022. FINDINGS: Cardiovascular: Enlarged heart. Enlarged ascending thoracic aorta measuring 5.2 cm in diameter at the level of the main pulmonary artery. Enlarged central pulmonary arteries with a main pulmonary artery measuring 3.6 cm in diameter. Maximum aortic arch diameter of 3.2 cm. Descending thoracic aortic diameter of 3.1 cm. Normally opacified pulmonary arteries with no pulmonary arterial filling defects seen. Atheromatous  calcifications,  including the coronary arteries and aorta. Mediastinum/Nodes: Endotracheal tube in satisfactory position. Enteric tube extending through the esophagus with its tip in the proximal to mid stomach. No enlarged lymph nodes. Unremarkable thyroid gland. Lungs/Pleura: Small to moderate-sized right pleural effusion and small left pleural effusion. Diffuse bilateral centrilobular and some paraseptal bullous changes, most pronounced in the upper lobes. Mild bilateral lower lobe dependent atelectasis. No airspace consolidation suspicious for pneumonia. No lung nodules. Upper Abdomen: The liver has mildly nodular contours with somewhat prominent lateral segment left lobe and caudate lobe and somewhat small right lobe. Two small probable cysts in the right lobe of the liver. Normal sized spleen. Small to moderate amount of ascites in the upper abdomen. Musculoskeletal: Minimal thoracic spine degenerative changes. Review of the MIP images confirms the above findings. IMPRESSION: 1. No pulmonary emboli. 2. Ascending thoracic aortic aneurysm measuring 5.2 cm in maximum diameter. Recommend semi-annual imaging followup by CTA or MRA and referral to cardiothoracic surgery if not already obtained. This recommendation follows 2010 ACCF/AHA/AATS/ACR/ASA/SCA/SCAI/SIR/STS/SVM Guidelines for the Diagnosis and Management of Patients With Thoracic Aortic Disease. Circulation. 2010; [id number]. Aortic aneurysm NOS (ICD10-I71.9) 3. Enlarged central pulmonary arteries compatible with pulmonary arterial hypertension. 4. Small to moderate-sized right pleural effusion and small left pleural effusion. 5. Changes of COPD and centrilobular and paraseptal emphysema. 6. Findings suggesting cirrhosis of the liver with an associated small to moderate amount of ascites in the upper abdomen. 7.  Calcific coronary artery and aortic atherosclerosis. Aortic Atherosclerosis (ICD10-I70.0) and Emphysema (ICD10-J43.9). Electronically Signed    By: Claudie Revering M.D.   On: 01/30/2023 18:48   ECHOCARDIOGRAM COMPLETE  Result Date: 01/30/2023    ECHOCARDIOGRAM REPORT   Patient Name:   Madison Langill Date of Exam: 01/30/2023 Medical Rec #:  [id number]      Height:       71.0 in Accession #:    [id number]     Weight:       223.3 lb Date of Birth:  1946/07/30     BSA:          2.210 m Patient Age:    76 years       BP:           163/69 mmHg Patient Gender: M              HR:           58 bpm. Exam Location:  [facility] Procedure: 2D Echo, Cardiac Doppler and Color Doppler Indications:     Elevated Troponin  History:         Patient has prior history of Echocardiogram examinations, most                  recent 10/02/2022. CHF, COPD; Risk Factors:Hypertension.                  Cardiogenic shock, ESRD.  Sonographer:     Wenda Low Referring Phys:  [id number] BRITTON L [place] Diagnosing Phys: Ida Rogue MD  Sonographer Comments: Suboptimal apical window and echo performed with patient supine and on artificial respirator. IMPRESSIONS  1. Left ventricular ejection fraction, by estimation, is 50 to 55%. The left ventricle has low normal function. The left ventricle has no regional wall motion abnormalities. There is mild left ventricular hypertrophy. Left ventricular diastolic parameters are consistent with Grade I diastolic dysfunction (impaired relaxation). There is the interventricular septum is flattened in systole and diastole, consistent with right ventricular pressure and volume overload.  2. Right  ventricular systolic function is severely reduced. The right ventricular size is moderately enlarged. There is severely elevated pulmonary artery systolic pressure. The estimated right ventricular systolic pressure is [id number] mmHg.  3. Right atrial size was moderately dilated.  4. The mitral valve is normal in structure. Mild mitral valve regurgitation. No evidence of mitral stenosis. There is mild late systolic prolapse of both leaflets of the mitral valve.  5.  Tricuspid valve regurgitation is mild to moderate.  6. The aortic valve is tricuspid. Aortic valve regurgitation is mild to moderate. No aortic stenosis is present.  7. The inferior vena cava is dilated in size with <50% respiratory variability, suggesting right atrial pressure of 15 mmHg.  8. There is mild dilatation of the aortic root, measuring 44 mm. There is mild dilatation of the ascending aorta, measuring 43 mm. FINDINGS  Left Ventricle: Left ventricular ejection fraction, by estimation, is 50 to 55%. The left ventricle has low normal function. The left ventricle has no regional wall motion abnormalities. The left ventricular internal cavity size was normal in size. There is mild left ventricular hypertrophy. The interventricular septum is flattened in systole and diastole, consistent with right ventricular pressure and volume overload. Left ventricular diastolic parameters are consistent with Grade I diastolic dysfunction (impaired relaxation). Right Ventricle: The right ventricular size is moderately enlarged. No increase in right ventricular wall thickness. Right ventricular systolic function is severely reduced. There is severely elevated pulmonary artery systolic pressure. The tricuspid regurgitant velocity is 3.45 m/s, and with an assumed right atrial pressure of 15 mmHg, the estimated right ventricular systolic pressure is [id number] mmHg. Left Atrium: Left atrial size was normal in size. Right Atrium: Right atrial size was moderately dilated. Pericardium: There is no evidence of pericardial effusion. Mitral Valve: The mitral valve is normal in structure. There is mild late systolic prolapse of both leaflets of the mitral valve. Mild mitral valve regurgitation. No evidence of mitral valve stenosis. MV peak gradient, 3.9 mmHg. The mean mitral valve gradient is 1.0 mmHg. Tricuspid Valve: The tricuspid valve is normal in structure. Tricuspid valve regurgitation is mild to moderate. No evidence of tricuspid  stenosis. Aortic Valve: The aortic valve is tricuspid. Aortic valve regurgitation is mild to moderate. No aortic stenosis is present. Aortic valve mean gradient measures 1.0 mmHg. Aortic valve peak gradient measures 3.9 mmHg. Aortic valve area, by VTI measures 3.79 cm. Pulmonic Valve: The pulmonic valve was normal in structure. Pulmonic valve regurgitation is mild to moderate. No evidence of pulmonic stenosis. Aorta: The aortic root is normal in size and structure. There is mild dilatation of the aortic root, measuring 44 mm. There is mild dilatation of the ascending aorta, measuring 43 mm. Venous: The inferior vena cava is dilated in size with less than 50% respiratory variability, suggesting right atrial pressure of 15 mmHg. IAS/Shunts: No atrial level shunt detected by color flow Doppler.  LEFT VENTRICLE PLAX 2D LVIDd:         4.40 cm   Diastology LVIDs:         3.00 cm   LV e' medial:    6.85 cm/s LV PW:         1.30 cm   LV E/e' medial:  6.3 LV IVS:        1.30 cm   LV e' lateral:   5.98 cm/s LVOT diam:     2.10 cm   LV E/e' lateral: 7.2 LV SV:         68 LV SV  Index:   31 LVOT Area:     3.46 cm  RIGHT VENTRICLE RV Basal diam:  5.10 cm RV Mid diam:    5.90 cm RV S prime:     14.50 cm/s TAPSE (M-mode): 1.8 cm LEFT ATRIUM             Index        RIGHT ATRIUM           Index LA diam:        2.90 cm 1.31 cm/m   RA Area:     27.50 cm LA Vol (A2C):   59.3 ml 26.84 ml/m  RA Volume:   101.00 ml 45.71 ml/m LA Vol (A4C):   59.1 ml 26.74 ml/m LA Biplane Vol: 59.4 ml 26.88 ml/m  AORTIC VALVE                    PULMONIC VALVE AV Area (Vmax):    3.22 cm     PV Vmax:       1.00 m/s AV Area (Vmean):   4.15 cm     PV Peak grad:  4.0 mmHg AV Area (VTI):     3.79 cm AV Vmax:           98.70 cm/s AV Vmean:          45.500 cm/s AV VTI:            0.180 m AV Peak Grad:      3.9 mmHg AV Mean Grad:      1.0 mmHg LVOT Vmax:         91.80 cm/s LVOT Vmean:        54.500 cm/s LVOT VTI:          0.197 m LVOT/AV VTI ratio: 1.09   AORTA Ao Root diam: 4.40 cm Ao Asc diam:  4.30 cm MITRAL VALVE               TRICUSPID VALVE MV Area (PHT): 2.10 cm    TR Peak grad:   47.6 mmHg MV Area VTI:   2.38 cm    TR Vmax:        345.00 cm/s MV Peak grad:  3.9 mmHg MV Mean grad:  1.0 mmHg    SHUNTS MV Vmax:       0.99 m/s    Systemic VTI:  0.20 m MV Vmean:      42.3 cm/s   Systemic Diam: 2.10 cm MV Decel Time: 361 msec MV E velocity: 43.00 cm/s MV A velocity: 84.90 cm/s MV E/A ratio:  0.51 Ida Rogue MD Electronically signed by Ida Rogue MD Signature Date/Time: 01/30/2023/4:10:59 PM    Final    DG Abd Portable 1V  Result Date: 01/30/2023 CLINICAL DATA:  NGT placement EXAM: PORTABLE ABDOMEN - 1 VIEW COMPARISON:  01/29/2023 FINDINGS: Image of the upper abdomen demonstrates a NG tube superimposed with the stomach below the diaphragm. IMPRESSION: NG tube appropriately positioned. Electronically Signed   By: Sammie Bench M.D.   On: 01/30/2023 13:12   DG Chest Port 1 View  Result Date: 01/29/2023 CLINICAL DATA:  Intubation EXAM: PORTABLE CHEST 1 VIEW COMPARISON:  01/29/2023 8:30 a.m. FINDINGS: Interval placement of endotracheal tube, tip over the midtrachea, and esophagogastric tube, tip not visualized although below the diaphragm. Cardiomegaly. Small bilateral pleural effusions. Osseous structures unremarkable. IMPRESSION: 1. Interval placement of endotracheal tube, tip over the midtrachea, and esophagogastric tube, tip not visualized although below the diaphragm. 2. Cardiomegaly. 3.  Small bilateral pleural effusions. Electronically Signed   By: Delanna Ahmadi M.D.   On: 01/29/2023 17:03   DG Abd 1 View  Result Date: 01/29/2023 CLINICAL DATA:  Orogastric tube placement. EXAM: ABDOMEN - 1 VIEW COMPARISON:  None Available. FINDINGS: Distal tip of nasogastric tube is seen in expected position of gastroesophageal junction. No abnormal bowel dilatation is noted. IMPRESSION: Distal tip of nasogastric tube is seen in expected position of  gastroesophageal junction. Advancement is recommended. Electronically Signed   By: Marijo Conception M.D.   On: 01/29/2023 17:02   DG Chest Portable 1 View  Result Date: 01/29/2023 CLINICAL DATA:  Shortness of breath. EXAM: PORTABLE CHEST 1 VIEW COMPARISON:  01/21/2022 FINDINGS: Leftward patient rotation. The cardio pericardial silhouette is enlarged. Vascular congestion noted with bibasilar atelectasis/infiltrate and probable layering small bilateral effusions. The visualized bony structures of the thorax are unremarkable. Telemetry leads overlie the chest. IMPRESSION: Vascular congestion with bibasilar atelectasis/infiltrate and probable layering small bilateral effusions. Electronically Signed   By: Misty Stanley M.D.   On: 01/29/2023 08:38     TELEMETRY reviewed by me (LT) 02/05/2023 : sinus tachycardia rate low 100s with occasional PVCs  EKG reviewed by me: NSR nonspecific TW changes  Data reviewed by me (LT) 02/05/2023: nephrology note critical care progress note, last 24 hours vitals, imaging, labs, telemetry  Principal Problem:   Shock circulatory ([place]) Active Problems:   Acute on chronic congestive heart failure ([place])   Toxic metabolic encephalopathy    ASSESSMENT AND PLAN:  Daryk Bohanan is a 46yoM with a PMH of HFpEF (50-55%, G1 DD with a D-shaped LV c/w RV overload, mi-mod TR 10/02/2022), COPD (prn 2 L), CKD 4, chronic thrombocytopenia, HTN, hx tobacco use who presented to [facility] ED 01/28/2023 at the request of his nephrologist after outpatient labs showed acutely worsening renal function.  He was brought in by EMS with altered mental status and in circulatory shock requiring vasopressors, CRRT for acute renal failure, and respiratory failure needing mechanical intubation. Troponins checked on admission were elevated and trended 2351, 2252, and 1824. BNP 2500. Echo this admission redemonstrates preserved LVEF 50-55%, RV failure with severely elevated RVSP (62.66mHg), mod RA dilation,  mi-mod TR and MR. Cardiology is consulted on hospital day 5 for assistance with his heart failure.    # acute hypoxic respiratory failure # toxic metabolic encephalopathy # shock In the setting of acute renal failure and AoC HFpEF as below with underlying COPD/pulm HTN.  -MRI brain ordered by primary team (not moving extremities to command) -on low dose levo this AM. BP low normal.   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 Presented with decreased UOP and doubling of Cr per outpatient labs. Now oliguric. CRRT/HD per nephrology.  # acute on chronic HFpEF # RV failure, pulm HTN BNP elevated at 2500 on admission, pleural effusions + severe RV dysfunction & cirrhotic changes on imaging. Unclear of trigger for this decompensation, query progressively worsening renal function in the setting of aggressive outpatient diuretics.  Dry weight reportedly ~198lbs. 206lbs today. Still clinically hypervolemic appearing, although improving. -volume removal per CRRT/HD -GDMT limited by hypotension requiring vasopressors. On midodrine at baseline.    # demand ischemia Troponins elevated and trending 2351, 2252, and 1824 on admission. Echo with severe RV dysfunction w/o rWMAs. No known hx of CAD.  Suspect demand in setting of acute renal failure and volume overload with possible underlying CAD.  Would consider ischemic / hemodynamic eval (R/LHC) at some point, but coagulopathy (38K plts) and overall hemodynamic instability preclude this currently.  # thrombocytopenia  Chronic in nature, drift from 91K -- low 32K plts, currently 58K without active bleeding.    This patient's plan of care was discussed and created with Dr. Saralyn Pilar and he is in agreement.  Signed: Tristan Schroeder , PA-C 02/05/2023, 8:20 AM [facility]

## 2023-02-05 NOTE — Procedures (Signed)
Extubation Procedure Note  Patient Details:   Name: John [name] DOB: 1946/07/25 MRN: [id number]   Airway Documentation:    Vent end date: 02/05/23 Vent end time: 1057   Evaluation  O2 sats: stable throughout Complications: No apparent complications Patient did tolerate procedure well. Bilateral Breath Sounds: Diminished   Yes able to cough and speak.  Placed on 2L [place].  John [name] 02/05/2023, 11:02 AM

## 2023-02-06 DIAGNOSIS — I509 Heart failure, unspecified: Secondary | ICD-10-CM | POA: Diagnosis not present

## 2023-02-06 DIAGNOSIS — I5043 Acute on chronic combined systolic (congestive) and diastolic (congestive) heart failure: Secondary | ICD-10-CM

## 2023-02-06 DIAGNOSIS — Z7189 Other specified counseling: Secondary | ICD-10-CM | POA: Diagnosis not present

## 2023-02-06 DIAGNOSIS — N179 Acute kidney failure, unspecified: Secondary | ICD-10-CM | POA: Diagnosis not present

## 2023-02-06 DIAGNOSIS — J9601 Acute respiratory failure with hypoxia: Secondary | ICD-10-CM | POA: Diagnosis not present

## 2023-02-06 DIAGNOSIS — R579 Shock, unspecified: Secondary | ICD-10-CM | POA: Diagnosis not present

## 2023-02-06 LAB — CBC WITH DIFFERENTIAL/PLATELET
Abs Immature Granulocytes: 0.05 10*3/uL (ref 0.00–0.07)
Basophils Absolute: 0 10*3/uL (ref 0.0–0.1)
Basophils Relative: 0 %
Eosinophils Absolute: 0.1 10*3/uL (ref 0.0–0.5)
Eosinophils Relative: 1 %
HCT: 40.4 % (ref 39.0–52.0)
Hemoglobin: 12.6 g/dL — ABNORMAL LOW (ref 13.0–17.0)
Immature Granulocytes: 1 %
Lymphocytes Relative: 8 %
Lymphs Abs: 0.8 10*3/uL (ref 0.7–4.0)
MCH: 32.3 pg (ref 26.0–34.0)
MCHC: 31.2 g/dL (ref 30.0–36.0)
MCV: 103.6 fL — ABNORMAL HIGH (ref 80.0–100.0)
Monocytes Absolute: 1.2 10*3/uL — ABNORMAL HIGH (ref 0.1–1.0)
Monocytes Relative: 12 %
Neutro Abs: 8 10*3/uL — ABNORMAL HIGH (ref 1.7–7.7)
Neutrophils Relative %: 78 %
Platelets: 73 10*3/uL — ABNORMAL LOW (ref 150–400)
RBC: 3.9 MIL/uL — ABNORMAL LOW (ref 4.22–5.81)
RDW: 15.9 % — ABNORMAL HIGH (ref 11.5–15.5)
WBC: 10.2 10*3/uL (ref 4.0–10.5)
nRBC: 0 % (ref 0.0–0.2)

## 2023-02-06 LAB — RENAL FUNCTION PANEL
Albumin: 2.6 g/dL — ABNORMAL LOW (ref 3.5–5.0)
Anion gap: 6 (ref 5–15)
BUN: 89 mg/dL — ABNORMAL HIGH (ref 8–23)
CO2: 28 mmol/L (ref 22–32)
Calcium: 9.3 mg/dL (ref 8.9–10.3)
Chloride: 107 mmol/L (ref 98–111)
Creatinine, Ser: 2.81 mg/dL — ABNORMAL HIGH (ref 0.61–1.24)
GFR, Estimated: 22 mL/min — ABNORMAL LOW (ref >60)
Glucose, Bld: 112 mg/dL — ABNORMAL HIGH (ref 70–99)
Phosphorus: 3.4 mg/dL (ref 2.5–4.6)
Potassium: 3.8 mmol/L (ref 3.5–5.1)
Sodium: 141 mmol/L (ref 135–145)

## 2023-02-06 LAB — GLUCOSE, CAPILLARY
Glucose-Capillary: 100 mg/dL — ABNORMAL HIGH (ref 70–99)
Glucose-Capillary: 81 mg/dL (ref 70–99)
Glucose-Capillary: 91 mg/dL (ref 70–99)
Glucose-Capillary: 124 mg/dL — ABNORMAL HIGH (ref 70–99)
Glucose-Capillary: 137 mg/dL — ABNORMAL HIGH (ref 70–99)
Glucose-Capillary: 126 mg/dL — ABNORMAL HIGH (ref 70–99)

## 2023-02-06 LAB — HEPATITIS B SURFACE ANTIGEN: Hepatitis B Surface Ag: NONREACTIVE

## 2023-02-06 LAB — MAGNESIUM: Magnesium: 2.4 mg/dL (ref 1.7–2.4)

## 2023-02-06 MED ORDER — MIDODRINE HCL 5 MG PO TABS: 2.5000 mg | ORAL_TABLET | Freq: Three times a day (TID) | ORAL | Status: DC

## 2023-02-06 MED ORDER — HEPARIN SODIUM (PORCINE) 5000 UNIT/ML IJ SOLN
5000.0000 [IU] | Freq: Three times a day (TID) | INTRAMUSCULAR | Status: DC
  Administered 2023-02-06 – 2023-02-18 (×32): 5000 [IU] via SUBCUTANEOUS
  Filled 2023-02-06 (×35): qty 1

## 2023-02-06 NOTE — Progress Notes (Signed)
[name]  ROUNDING NOTE   Subjective:   [name] was admitted to [facility] on 01/29/2023 for Peripheral edema [R60.9] Shock circulatory ([place]) [R57.9] Acute on chronic congestive heart failure, unspecified heart failure type ([place]) [I50.9] Acute renal failure superimposed on chronic kidney disease, unspecified acute renal failure type, unspecified CKD stage ([place]) [N17.9, N18.9]  Patient seen and evaluated at bedside in ICU Extubated yesterday, remains on 3L Alert and oriented No pressors Lower extremity edema greatly improved.   Objective:  Vital signs in last 24 hours:  Temp:  [97.9 F (36.6 C)-98.4 F (36.9 C)] 98.2 F (36.8 C) (02/22 0400) Pulse Rate:  [75-93] 82 (02/22 0800) Resp:  [12-23] 13 (02/22 0800) BP: (88-106)/(59-70) 98/67 (02/22 0800) SpO2:  [88 %-100 %] 96 % (02/22 0800) FiO2 (%):  [30 %] 30 % (02/21 1053) Weight:  [54.9 kg-65.4 kg] 65.4 kg (02/22 0500)  Weight change: -21.2 kg Filed Weights   02/05/23 0721 02/05/23 1518 02/06/23 0500  Weight: 72.5 kg 54.9 kg 65.4 kg    Intake/Output: I/O last 3 completed shifts: In: 1198.5 [I.V.:200.5; NG/GT:998] Out: 2161 [Urine:2160; Stool:1]   Intake/Output this shift:  Total I/O In: -  Out: 100 [Urine:100]  Physical Exam: General: Critically ill  Head: Normocephalic, atraumatic   Eyes: Anicteric  Neck: Supple  Lungs:  Basilar rhonchi, normal effort, [place] O2  Heart: regular  Abdomen:  Soft, nontender  Extremities: + dependent edema.    Neurologic: Alert and oriented  Skin: Left lower leg wound in clean and dry dressing  Access: Left femoral Temp HD catheter placed 2/14.     Basic Metabolic Panel: Recent Labs  Lab 02/02/23 0413 02/02/23 1605 02/03/23 0520 02/03/23 1735 02/04/23 0305 02/04/23 0400 02/05/23 0411 02/06/23 0520  NA 135   < > 136 134* 135  --  137 141  K 3.7   < > 2.9* 4.0 3.5  --  4.0 3.8  CL 105   < > 108 102 103  --  104 107  CO2 26   < > 24 26 26  $ --  28 28   GLUCOSE 187*   < > 143* 153* 168*  --  153* 112*  BUN 35*   < > 43* 55* 50*  --  71* 89*  CREATININE 1.60*   < > 1.28* 1.87* 1.75*  --  2.53* 2.81*  CALCIUM 8.8*   < > 7.8* 9.0 8.8*  --  9.2 9.3  MG 1.9  --  1.8  --   --  2.3 2.5* 2.4  PHOS 2.4*   < > 1.8* 3.9 2.2*  --  3.5 3.4   < > = values in this interval not displayed.     Liver Function Tests: Recent Labs  Lab 01/30/23 1612 01/31/23 0358 02/03/23 0520 02/03/23 1735 02/04/23 0305 02/05/23 0411 02/06/23 0520  AST 23  --  17  --   --   --   --   ALT 12  --  10  --   --   --   --   ALKPHOS 50  --  50  --   --   --   --   BILITOT 4.3*  --  1.6*  --   --   --   --   PROT 5.8*  --  5.0*  --   --   --   --   ALBUMIN 3.0*  3.0*   < > 2.3*  2.4* 2.8* 2.7* 2.8* 2.6*   < > =  values in this interval not displayed.    No results for input(s): "LIPASE", "AMYLASE" in the last 168 hours. Recent Labs  Lab 02/03/23 0956 02/05/23 1021  AMMONIA 45* 22     CBC: Recent Labs  Lab 02/03/23 0520 02/03/23 0956 02/04/23 0400 02/05/23 0411 02/06/23 0520  WBC 12.6* 14.0* 13.3* 11.9* 10.2  NEUTROABS  --  11.9*  --   --  8.0*  HGB 13.2 14.6 13.6 13.5 12.6*  HCT 42.7 46.4 43.0 42.7 40.4  MCV 104.4* 103.1* 101.7* 103.4* 103.6*  PLT 38* 38* 38* 57* 73*     Cardiac Enzymes: No results for input(s): "CKTOTAL", "CKMB", "CKMBINDEX", "TROPONINI" in the last 168 hours.  BNP: Invalid input(s): "POCBNP"  CBG: Recent Labs  Lab 02/05/23 1553 02/05/23 1924 02/05/23 2313 02/06/23 0334 02/06/23 0736  GLUCAP 138* 143* 122* 100* 81     Microbiology: Results for orders placed or performed during the hospital encounter of 01/29/23  Culture, Respiratory w Gram Stain     Status: None   Collection Time: 01/29/23  3:45 AM   Specimen: Tracheal Aspirate; Respiratory  Result Value Ref Range Status   Specimen Description   Final    TRACHEAL ASPIRATE Performed at [facility], [address], [place], [place] [address]     Special Requests   Final    NONE Performed at [facility], [address] [place], [place] [address]    Gram Stain   Final    MODERATE WBC PRESENT,BOTH PMN AND MONONUCLEAR NO ORGANISMS SEEN    Culture   Final    RARE Normal respiratory flora-no Staph aureus or Pseudomonas seen Performed at [place] [facility] [address] [place], [place] [address]    Report Status 02/01/2023 FINAL  Final  Culture, blood (Routine X 2) w Reflex to ID Panel     Status: None   Collection Time: 01/29/23  1:10 PM   Specimen: BLOOD  Result Value Ref Range Status   Specimen Description BLOOD BLOOD LEFT ARM  Final   Special Requests   Final    BOTTLES DRAWN AEROBIC AND ANAEROBIC Blood Culture adequate volume   Culture   Final    NO GROWTH 5 DAYS Performed at [facility], [address], [place], [place] [address]    Report Status 02/03/2023 FINAL  Final  MRSA Next Gen by PCR, Nasal     Status: None   Collection Time: 01/29/23  1:47 PM   Specimen: Nasal Mucosa; Nasal Swab  Result Value Ref Range Status   MRSA by PCR Next Gen NOT DETECTED NOT DETECTED Final    Comment: (NOTE) The GeneXpert MRSA Assay (FDA approved for NASAL specimens only), is one component of a comprehensive MRSA colonization surveillance program. It is not intended to diagnose MRSA infection nor to guide or monitor treatment for MRSA infections. Test performance is not FDA approved in patients less than 44 years old. Performed at [facility], [address] [place], [place] [address]   Culture, blood (Routine X 2) w Reflex to ID Panel     Status: None   Collection Time: 01/29/23  2:10 PM   Specimen: BLOOD  Result Value Ref Range Status   Specimen Description BLOOD A-LINE  Final   Special Requests   Final    BOTTLES DRAWN AEROBIC AND ANAEROBIC Blood Culture adequate volume   Culture   Final    NO GROWTH 5 DAYS Performed at [facility], [address] [place], [place] [address]     Report Status 02/03/2023 FINAL  Final    Coagulation Studies: No results for input(s): "LABPROT", "INR" in the last 72 hours.   Urinalysis: No results for input(s): "COLORURINE", "LABSPEC", "PHURINE", "GLUCOSEU", "HGBUR", "BILIRUBINUR", "KETONESUR", "PROTEINUR", "UROBILINOGEN", "NITRITE", "LEUKOCYTESUR" in the last 72 hours.  Invalid input(s): "APPERANCEUR"     Imaging: No results found.   Medications:    sodium chloride 5 mL/hr at 02/06/23 0600    aspirin  81 mg Oral Daily   Chlorhexidine Gluconate Cloth  6 each Topical Daily   docusate sodium  100 mg Oral BID   folic acid  1 mg Oral Daily   heparin injection (subcutaneous)  5,000 Units Subcutaneous Q8H   midodrine  2.5 mg Oral TID AC   multivitamin  1 tablet Oral QHS   thiamine (VITAMIN B1) injection  100 mg Intravenous Daily   docusate sodium, mouth rinse, polyethylene glycol  Assessment/ Plan:  [name] is a 77 y.o. black male with chronic diastolic congestive heart failure, hypertension, COPD, hyperlipidemia, who is admitted to [facility] on 01/29/2023 for Peripheral edema [R60.9] Shock circulatory ([place]) [R57.9] Acute on chronic congestive heart failure, unspecified heart failure type ([place]) [I50.9] Acute renal failure superimposed on chronic kidney disease, unspecified acute renal failure type, unspecified CKD stage ([place]) [N17.9, N18.9]  Acute Kidney Injury on chronic kidney disease stage IV: baseline creatinine of 2.67, GFR of 24 on 01/06/23. History of bland urine. Acute kidney injury most likely secondary to cardiorenal syndrome. Chronic kidney disease secondary to hypertension.  Renal function continues to decline, UOP adequate at 1.55L. Remains borderline for need of dialysis Attempted dialysis yesterday however terminated due to elevated cath pressures. Patient verbalized to this provider and attending that he does not wish to have any further dialysis. He states he will place his medical wishes in writing and  discuss his wishes with his significant other. Palliative care consulted.  Order placed to remove HD temp cath Will continue to monitor.   Acute exacerbation of chronic diastolic congestive heart failure and acute respiratory failure requiring intubation and mechanical ventilation.   Extubated on 02/05/23 Remains on 3L [place]  Hypotension with cardiogenic shock requiring vasopressors:  No pressors at this time. BP remains soft but stable   LOS: 8 Henna Derderian 2/22/202410:18 AM

## 2023-02-06 NOTE — Consult Note (Signed)
[place] Nurse Consult Note: Reason for Consult: back wound, noted in ED notes and on nursing flow sheet to have leg wound as well Wound type: Partial thickness wounds LE and right upper back; LE related to edema; back unclear etiology  Pressure Injury POA: NA Measurement:see nursing flow sheets Wound bed: image of the back wound; clean and pink Drainage (amount, consistency, odor) minimal drainage; serous each site per nursing Periwound: intact, edema  Dressing procedure/placement/frequency: Cleanse both wounds with saline, pat dry Cover each wound with single layer of xeroform gauze, top with foam  Change daily    Re consult if needed, will not follow at this time. Thanks  Ahnyla Mendel [employer], RN,CWOCN, CNS, [place] [phone])

## 2023-02-06 NOTE — Progress Notes (Signed)
PHARMACY CONSULT NOTE   Pharmacy Consult for Electrolyte Monitoring and Replacement   Recent Labs: Potassium (mmol/L)  Date Value  02/06/2023 3.8   Magnesium (mg/dL)  Date Value  02/06/2023 2.4   Calcium (mg/dL)  Date Value  02/06/2023 9.3   Albumin (g/dL)  Date Value  02/06/2023 2.6 (L)   Phosphorus (mg/dL)  Date Value  02/06/2023 3.4   Sodium (mmol/L)  Date Value  02/06/2023 141    Assessment: 77 year old male PMH HTN, CKD4, diastolic and systolic heart failure with cor pulmonale, COPD on 2L PTA, history ascending aortic aneurysm, CAD. Patient now extubated. Patient now off CRRT.  Goal of Therapy:  K > 4 Mg > 2 All other electrolytes within normal limits  Plan: Potassium: 3.8 >> no replacement needed Magnesium: 2.4 >> no replacement needed Phosphorus: 3.4 >> no replacement needed Check BMP, Mg, Phos with AM labs Pharmacy will sign off of electrolyte consult  Will M. Ouida Sills, PharmD PGY-1 Pharmacy Resident 02/06/2023 8:20 AM

## 2023-02-06 NOTE — Progress Notes (Signed)
NAME:  John [name], MRN:  [id number], DOB:  1946-11-09, LOS: 8 ADMISSION DATE:  01/29/2023, CONSULTATION DATE:  01/29/2023 REFERRING MD:  Dr. Cheri Fowler, CHIEF COMPLAINT:  Altered Mental Status, worsening renal function   Brief Pt Description / Synopsis:  77 y.o. male admitted with shock (? Cardiogenic vs ? Septic), Acute Hypoxic Respiratory Failure in the setting of Acute Decompensated HFpEF, and AKI on CKD Stage IV requiring intubation and mechanical ventilation, along with initiation of CRRT.  History of Present Illness:  This is 77 year old male with a history of essential hypertension, on hydralazine and Cozaar, stage IV CKD but has not been on dialysis in the past, has chronic diastolic and systolic heart failure with cor pulmonale, moderate tricuspid regurgitation and aortic valve regurgitation, history of a ascending aortic aneurysm, coronary artery disease with atherosclerosis, COPD and chronic hypoxemia baseline 2 L/min supplemental oxygen at home with recurrent episodes of hypoxemia and remote acute exacerbation of COPD with hypoxemia and hypercapnia 2 months ago history of ruptured left quadricep tendon 2 years ago, chronic thrombocytopenia and erythrocytosis recurrent lower extremity cellulitis, recurrent hyperkalemia, morbid obesity history of anasarca dyslipidemia, recent admission for acute CHF exacerbation requiring Lasix drip and BiPAP support, who was brought in by EMS due to worsening altered mental status and circulatory shock. I was able to meet with wife at bedside during my evaluation who shares patient has been sleeping majority of each day over the last week has been on arousable and received phone call from medical provider regarding worsening kidney function which prompted ER evaluation. He had chest x-ray performed while in the ER showing bilateral pleural effusions with vascular congestion and atelectasis/infiltrate. Patient in circulatory shock with Levophed support on  admission in ER. PCCM consultation for admission to medical intensive care unit with circulatory shock consistent with acute on chronic renal failure with CHF exacerbation.   Please see "significant hospital events" section below for full detailed hospital course.  Pertinent  Medical History   Past Medical History:  Diagnosis Date   CHF (congestive heart failure) (HCC)    EF 45% in 2021   Chronic kidney disease    COPD (chronic obstructive pulmonary disease) ([place]) 02/16/2020   Hyperlipidemia    Hypertension    Pneumonia 2021    Micro Data:  2/14: Tracheal aspirate>> normal respiratory Flora 2/14: Blood culture x2>> negative 2/14: MRSA PCR>>negative 2/14: Strep pneumo & Legionella urinary antigens>> negative  Antimicrobials:   Anti-infectives (From admission, onward)    Start     Dose/Rate Route Frequency Ordered Stop   01/31/23 1700  ceFEPIme (MAXIPIME) 2 g in sodium chloride 0.9 % 100 mL IVPB  Status:  Discontinued        2 g 200 mL/hr over 30 Minutes Intravenous Every 12 hours 01/31/23 1555 02/03/23 1048   01/30/23 1700  vancomycin (VANCOREADY) IVPB 1250 mg/250 mL  Status:  Discontinued        1,250 mg 166.7 mL/hr over 90 Minutes Intravenous Every 24 hours 01/29/23 1543 01/30/23 1036   01/30/23 1300  Ampicillin-Sulbactam (UNASYN) 3 g in sodium chloride 0.9 % 100 mL IVPB  Status:  Discontinued        3 g 200 mL/hr over 30 Minutes Intravenous Every 8 hours 01/30/23 1036 01/31/23 1118   01/30/23 0000  vancomycin variable dose per unstable renal function (pharmacist dosing)  Status:  Discontinued         Does not apply See admin instructions 01/29/23 1125 01/29/23 1548   01/29/23 2200  piperacillin-tazobactam (ZOSYN) IVPB 3.375 g  Status:  Discontinued        3.375 g 12.5 mL/hr over 240 Minutes Intravenous Every 6 hours 01/29/23 1547 01/29/23 1548   01/29/23 2200  piperacillin-tazobactam (ZOSYN) IVPB 3.375 g  Status:  Discontinued        3.375 g 100 mL/hr over 30 Minutes  Intravenous Every 6 hours 01/29/23 1548 01/30/23 1036   01/29/23 2100  piperacillin-tazobactam (ZOSYN) IVPB 2.25 g  Status:  Discontinued        2.25 g 100 mL/hr over 30 Minutes Intravenous Every 8 hours 01/29/23 1125 01/29/23 1547   01/29/23 1115  piperacillin-tazobactam (ZOSYN) IVPB 3.375 g        3.375 g 100 mL/hr over 30 Minutes Intravenous  Once 01/29/23 1103 01/29/23 1457   01/29/23 1115  vancomycin (VANCOREADY) IVPB 2000 mg/400 mL        2,000 mg 200 mL/hr over 120 Minutes Intravenous  Once 01/29/23 1104 01/29/23 1656        Significant Hospital Events: Including procedures, antibiotic start and stop dates in addition to other pertinent events   01/30/23- I met with girlfriend of patient today to review short term medical plan.  He had Echo whith RV dysfunction and concern for PE.  We have started CRRT yesterday with additional interval improvement. He will have CTPE today.  He is weaned down on pressors today on vasor 0.4 and 4 of levophed reduced from 36 this morning.  01/31/23- remains agitated intermittently overnight. Continued issues with clot burden through CRRT circuit. 02/01/23- remains agitated overnight. Requiring numerous ativan pushes. Attempting to wean down sedation. Failed SBT miserably. 02/02/23- issues with CRRT overnight. Will attempt higher UF today. Assess for possible chest tube placement.  02/03/23- No issues with CRRT overnight, currently running without complication.  CT Head obtained overnight and negative for acute process.  Off Precedex this morning, currently somnolent, mental status currently precluding extubation. Will perform SBT as mental status permits.  Stop Cefepime, checking ammonia. 02/04/23- Remains on CRRT. Pt more awake and tracking.  Tolerated SBT for 12 hrs  02/05/23- Pt successfully extubated 02/06/23-Off vasopressors.  Palliative Care consulted pt stating he DOES NOT want any form of dialysis.  If pt remains stable will transfer service to [facility]    Interim History / Subjective:  No acute events overnight currently off vasopressors.  Tolerating 3L O2   Objective   Blood pressure 95/60, pulse 77, temperature 98.2 F (36.8 C), temperature source Oral, resp. rate 15, height 5' 11"$  (1.803 m), weight 65.4 kg, SpO2 97 %.    Vent Mode: PSV;CPAP FiO2 (%):  [30 %] 30 % PEEP:  [5 cmH20] 5 cmH20 Pressure Support:  [5 cmH20] 5 cmH20   Intake/Output Summary (Last 24 hours) at 02/06/2023 0803 Last data filed at 02/06/2023 0600 Gross per 24 hour  Intake 400.12 ml  Output 1561 ml  Net -1160.88 ml   Filed Weights   02/05/23 0721 02/05/23 1518 02/06/23 0500  Weight: 72.5 kg 54.9 kg 65.4 kg    Examination: General: Acutely-ill appearing male, NAD, 3L O2 nasal canula  HENT: Atraumatic, normocephalic, neck supple, no JVD Lungs: Diminished throughout, even, non labored  Cardiovascular: NSR, s1s2, no m/r/g, 2+ radial/1+ distal pulses via doppler, 2+ BLE edema  Abdomen: Soft, nontender, non distended, +BS x4 Extremities: Normal bulk and tone, no deformities, chronic stasis dermatitis with 2+ edema bilaterally Neuro: Alert, disoriented to time, following commands, PERRLA  Skin: right midback see below    GU:  Foley catheter in place draining scant amount of urine  Resolved Hospital Problem list   Cardiogenic vs. Septic shock  Mechanical Intubation   Assessment & Plan:   #Acute Hypoxic Respiratory Failure in setting of Pulmonary Edema, Pulmonary Hypertension, & small bilateral Pleural Effusions~Improving  PMHx: COPD CTA Chest 01/30/23: negative for PE, Enlarged central pulmonary arteries compatible with pulmonary arterial hypertension, Small to moderate-sized right pleural effusion and small left pleural effusion. - Supplemental O2 for dyspnea and/or hypoxia   #Acute Decompensated HFpEF & Right sided HF #Hypotension likely now secondary to sedating medication  #Moderate Tricuspid & Aortic Regurgitation #Elevated Troponin in setting of  NSTEMI vs. demand ischemia Echocardiogram 01/30/23: LVEF 50055%, Grade I DD, severely reduced RV systolic function, mild to moderate TR & AR - Vasopressors as needed to maintain MAP >65  - Wean stress dose steroids as able  - Lactic acid is normalized - HS Troponin peaked at 2351 - Cardiology following, appreciate input  #AKI on CKD Stage IV - Trend BMP  - Replace electrolytes as indicated  - Monitor UOP  - Nephrology consulted appreciate input: Pt states he DOES NOT want hemodialysis palliative care consulted    #Thrombocytopenia, suspect due to Uremia superimposed on chronic thrombocytopenia #HIT~RULED OUT - Trend CBC  - Monitor for s/sx of bleeding  - Transfuse for hgb <7   #Leukocytosis, previous fevers~resolved  - Trend WBC and monitor fever curve  - Completed 6 day course of empiric abx; no indication to restart abx therapy at this time   #Acute metabolic encephalopathy vs possible hepatic encephalopathy~ improving CT Head 02/02/23: negative for acute intracranial abnormality - Treatment of metabolic derangements as outlined above - Provide support care  - Avoid sedating medications as able - PT/OT  Best Practice (right click and "Reselect all SmartList Selections" daily)   Diet/type: NPO pending speech evaluation  DVT prophylaxis: SCD GI prophylaxis: PPI Lines: Left femoral HD catheter, and is still needed Foley:  Yes, and it is still needed Code Status:  full code Last date of multidisciplinary goals of care discussion [02/06/23]  02/22: Pt updated regarding current plan of care and all questions  Labs   CBC: Recent Labs  Lab 02/03/23 0520 02/03/23 0956 02/04/23 0400 02/05/23 0411 02/06/23 0520  WBC 12.6* 14.0* 13.3* 11.9* 10.2  NEUTROABS  --  11.9*  --   --  8.0*  HGB 13.2 14.6 13.6 13.5 12.6*  HCT 42.7 46.4 43.0 42.7 40.4  MCV 104.4* 103.1* 101.7* 103.4* 103.6*  PLT 38* 38* 38* 57* 73*    Basic Metabolic Panel: Recent Labs  Lab 02/02/23 0413  02/02/23 1605 02/03/23 0520 02/03/23 1735 02/04/23 0305 02/04/23 0400 02/05/23 0411 02/06/23 0520  NA 135   < > 136 134* 135  --  137 141  K 3.7   < > 2.9* 4.0 3.5  --  4.0 3.8  CL 105   < > 108 102 103  --  104 107  CO2 26   < > 24 26 26  $ --  28 28  GLUCOSE 187*   < > 143* 153* 168*  --  153* 112*  BUN 35*   < > 43* 55* 50*  --  71* 89*  CREATININE 1.60*   < > 1.28* 1.87* 1.75*  --  2.53* 2.81*  CALCIUM 8.8*   < > 7.8* 9.0 8.8*  --  9.2 9.3  MG 1.9  --  1.8  --   --  2.3 2.5* 2.4  PHOS  2.4*   < > 1.8* 3.9 2.2*  --  3.5 3.4   < > = values in this interval not displayed.   GFR: Estimated Creatinine Clearance: 20.4 mL/min (A) (by C-G formula based on SCr of 2.81 mg/dL (H)). Recent Labs  Lab 02/03/23 0956 02/04/23 0400 02/05/23 0411 02/06/23 0520  WBC 14.0* 13.3* 11.9* 10.2    Liver Function Tests: Recent Labs  Lab 01/30/23 1612 01/31/23 0358 02/03/23 0520 02/03/23 1735 02/04/23 0305 02/05/23 0411 02/06/23 0520  AST 23  --  17  --   --   --   --   ALT 12  --  10  --   --   --   --   ALKPHOS 50  --  50  --   --   --   --   BILITOT 4.3*  --  1.6*  --   --   --   --   PROT 5.8*  --  5.0*  --   --   --   --   ALBUMIN 3.0*  3.0*   < > 2.3*  2.4* 2.8* 2.7* 2.8* 2.6*   < > = values in this interval not displayed.   No results for input(s): "LIPASE", "AMYLASE" in the last 168 hours. Recent Labs  Lab 02/03/23 0956 02/05/23 1021  AMMONIA 45* 22    ABG    Component Value Date/Time   PHART 7.35 02/03/2023 1047   PCO2ART 49 (H) 02/03/2023 1047   PO2ART 91 02/03/2023 1047   HCO3 27.1 02/03/2023 1047   TCO2 31 02/15/2020 1034   O2SAT 98.9 02/03/2023 1047     Coagulation Profile: No results for input(s): "INR", "PROTIME" in the last 168 hours.   Cardiac Enzymes: No results for input(s): "CKTOTAL", "CKMB", "CKMBINDEX", "TROPONINI" in the last 168 hours.  HbA1C: Hgb A1c MFr Bld  Date/Time Value Ref Range Status  10/02/2022 11:16 PM 6.3 (H) 4.8 - 5.6 % Final     Comment:    (NOTE) Pre diabetes:          5.7%-6.4%  Diabetes:              >6.4%  Glycemic control for   <7.0% adults with diabetes     CBG: Recent Labs  Lab 02/05/23 1553 02/05/23 1924 02/05/23 2313 02/06/23 0334 02/06/23 0736  GLUCAP 138* 143* 122* 100* 81    Review of Systems: Positives in BOLD   Gen: Denies fever, chills, weight change, fatigue, night sweats HEENT: Denies blurred vision, double vision, hearing loss, tinnitus, sinus congestion, rhinorrhea, sore throat, neck stiffness, dysphagia PULM: Denies shortness of breath, cough, sputum production, hemoptysis, wheezing CV: Denies chest pain, edema, orthopnea, paroxysmal nocturnal dyspnea, palpitations GI: Denies abdominal pain, nausea, vomiting, diarrhea, hematochezia, melena, constipation, change in bowel habits GU: Denies dysuria, hematuria, polyuria, oliguria, urethral discharge Endocrine: Denies hot or cold intolerance, polyuria, polyphagia or appetite change Derm: Denies rash, dry skin, scaling or peeling skin change Heme: Denies easy bruising, bleeding, bleeding gums Neuro: Denies headache, numbness, weakness, slurred speech, loss of memory or consciousness   Past Medical History:  He,  has a past medical history of CHF (congestive heart failure) ([place]), Chronic kidney disease, COPD (chronic obstructive pulmonary disease) ([place]) (02/16/2020), Hyperlipidemia, Hypertension, and Pneumonia (2021).   Surgical History:   Past Surgical History:  Procedure Laterality Date   CATARACT EXTRACTION W/PHACO Left 10/31/2021   Procedure: CATARACT EXTRACTION PHACO AND INTRAOCULAR LENS PLACEMENT (IOC) LEFT VISION BLUE 3.45 00:48.0;  Surgeon:  Leandrew Koyanagi, MD;  Location: [place];  Service: Ophthalmology;  Laterality: Left;   CATARACT EXTRACTION W/PHACO Right 11/14/2021   Procedure: CATARACT EXTRACTION PHACO AND INTRAOCULAR LENS PLACEMENT (IOC) RIGHT 8.59 01:10.5;  Surgeon: Leandrew Koyanagi, MD;   Location: [place];  Service: Ophthalmology;  Laterality: Right;   INGUINAL HERNIA REPAIR Right 02/04/2017   Procedure: HERNIA REPAIR INGUINAL ADULT;  Surgeon: Leonie Green, MD;  Location: [facility];  Service: General;  Laterality: Right;   NO PAST SURGERIES     QUADRICEPS TENDON REPAIR Left 02/15/2020   Procedure: REPAIR QUADRICEP TENDON;  Surgeon: Corky Mull, MD;  Location: [facility];  Service: Orthopedics;  Laterality: Left;     Social History:   reports that he quit smoking about 7 years ago. His smoking use included cigarettes. He has a 13.25 pack-year smoking history. He has never used smokeless tobacco. He reports current alcohol use. He reports that he does not use drugs.   Family History:  His family history includes Diabetes in his brother, maternal grandmother, mother, and paternal grandmother.   Allergies No Known Allergies   Home Medications  Prior to Admission medications   Medication Sig Start Date End Date Taking? Authorizing Provider  aspirin EC 81 MG tablet Take 1 tablet (81 mg total) by mouth daily. Swallow whole. 03/28/22  Yes Hackney, Otila Kluver A, FNP  calcitRIOL (ROCALTROL) 0.25 MCG capsule Take 0.25 mcg by mouth daily.   Yes [provider]  cephALEXin (KEFLEX) 500 MG capsule Take 500 mg by mouth 3 (three) times daily.   Yes [provider]  gabapentin (NEURONTIN) 100 MG capsule Take 1 capsule (100 mg total) by mouth 2 (two) times daily. 10/15/22  Yes [place] Eric J, DO  ipratropium-albuterol (DUONEB) 0.5-2.5 (3) MG/3ML SOLN Inhale 3 mLs into the lungs every 6 (six) hours as needed (wheezing).   Yes [provider]  losartan (COZAAR) 50 MG tablet Take 50 mg by mouth daily. 12/12/22  Yes [provider]  metolazone (ZAROXOLYN) 2.5 MG tablet Take 1 tablet (2.5 mg total) by mouth 2 (two) times a week. 10/15/22  Yes [place] Eric J, DO  midodrine (PROAMATINE) 2.5 MG tablet Take 2.5 mg by mouth 3 (three) times daily. 01/01/23   Yes [provider]  omeprazole (PRILOSEC) 20 MG capsule Take 20 mg by mouth daily. 11/04/22 11/04/23 Yes [provider]  potassium chloride SA (KLOR-CON M) 20 MEQ tablet Take 1 tablet (20 mEq total) by mouth daily. With extra 85mq on M/ F with metolazone 01/28/23  Yes HDarylene PriceA, FNP  torsemide 60 MG TABS Take 60 mg by mouth daily. 11/27/22  Yes SSidney Ace MD     Critical care time: 342minutes     DDonell Beers AMorrisonPager [phone](please enter 7 digits) PCCM Consult Pager [phone](please enter 7 digits)

## 2023-02-06 NOTE — Progress Notes (Signed)
[place] NOTE       Patient ID: John [name] MRN: [id number] DOB/AGE: [birth year] 77 y.o.  Admit date: 01/29/2023 Referring Physician Darel Hong, NP  Primary Physician Dr. Ginette Pitman Primary Cardiologist Dr. Nehemiah Massed  Reason for Consultation elevated troponin / AoCHF  HPI: John [name] is a 27yoM with a PMH of HFpEF (50-55%, G1 DD with a D-shaped LV c/w RV overload, mi-mod TR 10/02/2022), COPD (prn 2 L), CKD 4, chronic thrombocytopenia, HTN, hx tobacco use who presented to [facility] ED 01/28/2023 at the request of his nephrologist after outpatient labs showed acutely worsening renal function.  He was brought in by EMS with altered mental status and in circulatory shock requiring vasopressors, CRRT for acute renal failure, and respiratory failure needing mechanical intubation. Troponins checked on admission were elevated and trended 2351, 2252, and 1824. BNP 2500. Echo this admission redemonstrates preserved LVEF 50-55%, RV failure with severely elevated RVSP (62.51mHg), mod RA dilation, mi-mod TR and MR. Cardiology is consulted on hospital day 5 for assistance with his heart failure.   Interval History:  - extubated to [place] oxygen yesterday - issues with HD pressures elevated last PM - declines further dialysis today, palliative care consulted.  - seen and examined this PM with significant other at bedside. She expresses concern that the patient is "not in the place to make decisions so soon after getting off the breathing machine" - the patient has no specific complaints other than being hungry. Pain in his L leg with palpation  Past Medical History:  Diagnosis Date   CHF (congestive heart failure) (HCC)    EF 45% in 2021   Chronic kidney disease    COPD (chronic obstructive pulmonary disease) ([place] 02/16/2020   Hyperlipidemia    Hypertension    Pneumonia 2021    Past Surgical History:  Procedure Laterality Date   CATARACT EXTRACTION W/PHACO Left 10/31/2021    Procedure: CATARACT EXTRACTION PHACO AND INTRAOCULAR LENS PLACEMENT ([place] LEFT VISION BLUE 3.45 00:48.0;  Surgeon: BLeandrew Koyanagi MD;  [place]  Service: Ophthalmology;  Laterality: Left;   CATARACT EXTRACTION W/PHACO Right 11/14/2021   Procedure: CATARACT EXTRACTION PHACO AND INTRAOCULAR LENS PLACEMENT (I[facility]) RIGHT 8.59 01:10.5;  Surgeon: BLeandrew Koyanagi MD;  [place]  Service: Ophthalmology;  Laterality: Right;   INGUINAL HERNIA REPAIR Right 02/04/2017   Procedure: HERNIA REPAIR INGUINAL ADULT;  Surgeon: JLeonie Green MD;  Location: [facility] ORS;  Service: General;  Laterality: Right;   NO PAST SURGERIES     QUADRICEPS TENDON REPAIR Left 02/15/2020   Procedure: REPAIR QUADRICEP TENDON;  Surgeon: PCorky Mull MD;  Location: [facility] ORS;  Service: Orthopedics;  Laterality: Left;    Medications Prior to Admission  Medication Sig Dispense Refill Last Dose   aspirin EC 81 MG tablet Take 1 tablet (81 mg total) by mouth daily. Swallow whole. 90 tablet 3 01/28/2023   calcitRIOL (ROCALTROL) 0.25 MCG capsule Take 0.25 mcg by mouth daily.   01/28/2023   cephALEXin (KEFLEX) 500 MG capsule Take 500 mg by mouth 3 (three) times daily.   01/28/2023   gabapentin (NEURONTIN) 100 MG capsule Take 1 capsule (100 mg total) by mouth 2 (two) times daily.   01/28/2023   ipratropium-albuterol (DUONEB) 0.5-2.5 (3) MG/3ML SOLN Inhale 3 mLs into the lungs every 6 (six) hours as needed (wheezing).   unknown   losartan (COZAAR) 50 MG tablet Take 50 mg by mouth daily.   01/28/2023   metolazone (ZAROXOLYN) 2.5 MG tablet  Take 1 tablet (2.5 mg total) by mouth 2 (two) times a week.   Past Week   midodrine (PROAMATINE) 2.5 MG tablet Take 2.5 mg by mouth 3 (three) times daily.   01/28/2023   omeprazole (PRILOSEC) 20 MG capsule Take 20 mg by mouth daily.   01/28/2023   potassium chloride SA (KLOR-CON M) 20 MEQ tablet Take 1 tablet (20 mEq total) by mouth daily. With extra 2mq on M/ F  with metolazone 100 tablet 3 Past Week   torsemide 60 MG TABS Take 60 mg by mouth daily. 30 tablet 1 01/28/2023   Social History   Socioeconomic History   Marital status: Single    Spouse name: Not on file   Number of children: Not on file   Years of education: Not on file   Highest education level: Not on file  Occupational History   Not on file  Tobacco Use   Smoking status: Former    Packs/day: 0.25    Years: 53.00    Total pack years: 13.25    Types: Cigarettes    Quit date: 02/15/2015    Years since quitting: 7.9   Smokeless tobacco: Never  Substance and Sexual Activity   Alcohol use: Yes    Comment: occassional   Drug use: No   Sexual activity: Not on file  Other Topics Concern   Not on file  Social History Narrative   Not on file   Social Determinants of Health   Financial Resource Strain: Low Risk  (07/17/2022)   Overall Financial Resource Strain (CARDIA)    Difficulty of Paying Living Expenses: Not hard at all  Food Insecurity: No Food Insecurity (01/29/2023)   Hunger Vital Sign    Worried About Running Out of Food in the Last Year: Never true    Ran Out of Food in the Last Year: Never true  Transportation Needs: No Transportation Needs (01/29/2023)   PRAPARE - THydrologist(Medical): No    Lack of Transportation (Non-Medical): No  Physical Activity: Sufficiently Active (07/17/2022)   Exercise Vital Sign    Days of Exercise per Week: 5 days    Minutes of Exercise per Session: 60 min  Stress: No Stress Concern Present (07/17/2022)   [place]   Feeling of Stress : Not at all  Social Connections: [place](07/17/2022)   Social Connection and Isolation Panel [NHANES]    Frequency of Communication with Friends and Family: More than three times a week    Frequency of Social Gatherings with Friends and Family: More than three times a week    Attends Religious  Services: 1 to 4 times per year    Active Member of [employer] Organizations: Yes    Attends CArchivistMeetings: More than 4 times per year    Marital Status: Living with partner  Intimate Partner Violence: Not At Risk (01/29/2023)   Humiliation, Afraid, Rape, and Kick questionnaire    Fear of Current or Ex-Partner: No    Emotionally Abused: No    Physically Abused: No    Sexually Abused: No    Family History  Problem Relation Age of Onset   Diabetes Mother    Diabetes Brother    Diabetes Maternal Grandmother    Diabetes Paternal Grandmother       Intake/Output Summary (Last 24 hours) at 02/06/2023 0815 Last data filed at 02/06/2023 0600 Gross per 24 hour  Intake 400.12 ml  Output 1561 ml  Net -1160.88 ml     Vitals:   02/06/23 0300 02/06/23 0400 02/06/23 0500 02/06/23 0600  BP: '95/66 95/67 95/62 '$ 95/60  Pulse: 77 75  77  Resp:  '13 12 15  '$ Temp:  98.2 F (36.8 C)    TempSrc:  Oral    SpO2: 93% 96%  97%  Weight:   65.4 kg   Height:        PHYSICAL EXAM General: Acute on chronically ill-appearing black male, sitting upright in ICU bed with significant other at bedside.  HEENT:  Normocephalic and atraumatic. Poor dentition Neck:  + JVD.  Lungs: normal respiratory effort on o2 by [place]. Decreased breath sounds and crackles bilaterally.  Heart: regular rate and rhythm. Normal S1 and S2 without gallops or murmurs.  Abdomen: distended appearing Msk: generalized weakness Extremities: No clubbing, cyanosis. Trace bilateral peripheral edema, chronic hyperpigmentation + skin wrinkling/dimpling. Tenderness to palpation of LLE. Kerlix dressing to LLE Neuro: fatigued Psych: somnolent   Labs: Basic Metabolic Panel: Recent Labs    02/05/23 0411 02/06/23 0520  NA 137 141  K 4.0 3.8  CL 104 107  CO2 28 28  GLUCOSE 153* 112*  BUN 71* 89*  CREATININE 2.53* 2.81*  CALCIUM 9.2 9.3  MG 2.5* 2.4  PHOS 3.5 3.4    Liver Function Tests: Recent Labs    02/05/23 0411  02/06/23 0520  ALBUMIN 2.8* 2.6*    No results for input(s): "LIPASE", "AMYLASE" in the last 72 hours. CBC: Recent Labs    02/03/23 0956 02/04/23 0400 02/05/23 0411 02/06/23 0520  WBC 14.0*   < > 11.9* 10.2  NEUTROABS 11.9*  --   --  8.0*  HGB 14.6   < > 13.5 12.6*  HCT 46.4   < > 42.7 40.4  MCV 103.1*   < > 103.4* 103.6*  PLT 38*   < > 57* 73*   < > = values in this interval not displayed.    Cardiac Enzymes: No results for input(s): "CKTOTAL", "CKMB", "CKMBINDEX", "TROPONINIHS" in the last 72 hours. BNP: Recent Labs    02/03/23 0956  BNP 1,706.8*    D-Dimer: No results for input(s): "DDIMER" in the last 72 hours. Hemoglobin A1C: No results for input(s): "HGBA1C" in the last 72 hours. Fasting Lipid Panel: No results for input(s): "CHOL", "HDL", "LDLCALC", "TRIG", "CHOLHDL", "LDLDIRECT" in the last 72 hours. Thyroid Function Tests: No results for input(s): "TSH", "T4TOTAL", "T3FREE", "THYROIDAB" in the last 72 hours.  Invalid input(s): "FREET3" Anemia Panel: No results for input(s): "VITAMINB12", "FOLATE", "FERRITIN", "TIBC", "IRON", "RETICCTPCT" in the last 72 hours.   Radiology: [facility] Chest Port 1 View  Result Date: 02/04/2023 CLINICAL DATA:  Acute respiratory failure with hypoxia. EXAM: PORTABLE CHEST 1 VIEW COMPARISON:  Chest radiograph February 01, 2023. FINDINGS: Endotracheal tube with tip in the midthoracic trachea 4.8 cm from the carina. Esophageal temperature probe in place. Nasogastric tube courses below the diaphragm with tip obscured by collimation. Additional cardiac monitoring leads project over the chest. Normal size heart. Aortic atherosclerosis. Apparent widening of the mediastinal vascular pedicle is favored secondary to rotation. Layering small bilateral pleural effusions with persistent right-greater-than-left airspace opacitie. No acute osseous abnormality IMPRESSION: 1. Stable layering small bilateral pleural effusions with persistent  right-greater-than-left airspace opacities. Electronically Signed   By: Dahlia Bailiff M.D.   On: 02/04/2023 08:24   DG Abd 1 View  Result Date: 02/03/2023 CLINICAL DATA:  Vomiting EXAM: ABDOMEN - 1  VIEW COMPARISON:  01/30/2023 FINDINGS: Nasogastric tube noted with tip and side port in the stomach body. Gas fills the stomach. Mild scarring or atelectasis at the left lung base. A catheter projects over the left groin and extends over into the right abdomen, presumably a venous catheter. There is also a small amount of tubing projecting over the right hip. There is evidence of lumbar spondylosis and degenerative disc disease. Unremarkable bowel gas pattern. IMPRESSION: 1. Nasogastric tube tip and side port are in the stomach body. 2. Mild scarring or atelectasis at the left lung base. 3. Unremarkable bowel gas pattern. Electronically Signed   By: Van Clines M.D.   On: 02/03/2023 15:26   CT HEAD WO CONTRAST (5MM)  Result Date: 02/03/2023 CLINICAL DATA:  Altered mental status EXAM: CT HEAD WITHOUT CONTRAST TECHNIQUE: Contiguous axial images were obtained from the base of the skull through the vertex without intravenous contrast. RADIATION DOSE REDUCTION: This exam was performed according to the departmental dose-optimization program which includes automated exposure control, adjustment of the mA and/or kV according to patient size and/or use of iterative reconstruction technique. COMPARISON:  None Available. FINDINGS: Brain: No evidence of acute infarction, hemorrhage, mass, mass effect, or midline shift. No hydrocephalus or extra-axial fluid collection. Partial empty sella. Normal craniocervical junction. Cerebral volume is within normal limits for age. Vascular: No hyperdense vessel. Skull: Negative for fracture or focal lesion. Sinuses/Orbits: Mucosal thickening in the left greater than right maxillary sinus. Other: Underpneumatized right mastoid, with trace fluid. The left mastoid air cells are well  aerated. Endotracheal and orogastric tubes noted. IMPRESSION: No acute intracranial process. Electronically Signed   By: Merilyn Baba M.D.   On: 02/03/2023 03:03   DG Chest Port 1 View  Result Date: 02/01/2023 CLINICAL DATA:  Dyspnea EXAM: PORTABLE CHEST 1 VIEW COMPARISON:  01/29/2023 FINDINGS: There is pulmonary vascular congestion. Bibasilar consolidation or volume loss. No pneumothorax. Possible small pleural effusions. Endotracheal tube tip 3 cm above carina. NG tube tip below the diaphragm and off x-ray. Mediastinal drain or probe overlies the mid thorax. IMPRESSION: Vascular congestion.  Bibasilar consolidation or volume loss. Electronically Signed   By: Sammie Bench M.D.   On: 02/01/2023 13:56   CT Angio Chest Pulmonary Embolism (PE) W or WO Contrast  Result Date: 01/30/2023 CLINICAL DATA:  Chronic hypoxemia and hypercapnia related to COPD, congestive heart failure and chronic renal failure. EXAM: CT ANGIOGRAPHY CHEST WITH CONTRAST TECHNIQUE: Multidetector CT imaging of the chest was performed using the standard protocol during bolus administration of intravenous contrast. Multiplanar CT image reconstructions and MIPs were obtained to evaluate the vascular anatomy. RADIATION DOSE REDUCTION: This exam was performed according to the departmental dose-optimization program which includes automated exposure control, adjustment of the mA and/or kV according to patient size and/or use of iterative reconstruction technique. CONTRAST:  2m OMNIPAQUE IOHEXOL 350 MG/ML SOLN COMPARISON:  Portable chest obtained yesterday. Chest, abdomen and pelvis CT dated 01/18/2022. FINDINGS: Cardiovascular: Enlarged heart. Enlarged ascending thoracic aorta measuring 5.2 cm in diameter at the level of the main pulmonary artery. Enlarged central pulmonary arteries with a main pulmonary artery measuring 3.6 cm in diameter. Maximum aortic arch diameter of 3.2 cm. Descending thoracic aortic diameter of 3.1 cm. Normally  opacified pulmonary arteries with no pulmonary arterial filling defects seen. Atheromatous calcifications, including the coronary arteries and aorta. Mediastinum/Nodes: Endotracheal tube in satisfactory position. Enteric tube extending through the esophagus with its tip in the proximal to mid stomach. No enlarged lymph nodes. Unremarkable thyroid  gland. Lungs/Pleura: Small to moderate-sized right pleural effusion and small left pleural effusion. Diffuse bilateral centrilobular and some paraseptal bullous changes, most pronounced in the upper lobes. Mild bilateral lower lobe dependent atelectasis. No airspace consolidation suspicious for pneumonia. No lung nodules. Upper Abdomen: The liver has mildly nodular contours with somewhat prominent lateral segment left lobe and caudate lobe and somewhat small right lobe. Two small probable cysts in the right lobe of the liver. Normal sized spleen. Small to moderate amount of ascites in the upper abdomen. Musculoskeletal: Minimal thoracic spine degenerative changes. Review of the MIP images confirms the above findings. IMPRESSION: 1. No pulmonary emboli. 2. Ascending thoracic aortic aneurysm measuring 5.2 cm in maximum diameter. Recommend semi-annual imaging followup by CTA or MRA and referral to cardiothoracic surgery if not already obtained. This recommendation follows 2010 ACCF/AHA/AATS/ACR/ASA/SCA/SCAI/SIR/STS/SVM Guidelines for the Diagnosis and Management of Patients With Thoracic Aortic Disease. Circulation. 2010; [id number]. Aortic aneurysm NOS (ICD10-I71.9) 3. Enlarged central pulmonary arteries compatible with pulmonary arterial hypertension. 4. Small to moderate-sized right pleural effusion and small left pleural effusion. 5. Changes of COPD and centrilobular and paraseptal emphysema. 6. Findings suggesting cirrhosis of the liver with an associated small to moderate amount of ascites in the upper abdomen. 7.  Calcific coronary artery and aortic atherosclerosis.  Aortic Atherosclerosis (ICD10-I70.0) and Emphysema (ICD10-J43.9). Electronically Signed   By: Claudie Revering M.D.   On: 01/30/2023 18:48   ECHOCARDIOGRAM COMPLETE  Result Date: 01/30/2023    ECHOCARDIOGRAM REPORT   Patient Name:   Hasaan Reitz Date of Exam: 01/30/2023 Medical Rec #:  [id number]      Height:       71.0 in Accession #:    [id number]     Weight:       223.3 lb Date of Birth:  November 14, 1946     BSA:          2.210 m Patient Age:    61 years       BP:           163/69 mmHg Patient Gender: M              HR:           58 bpm. Exam Location:  [facility] Procedure: 2D Echo, Cardiac Doppler and Color Doppler Indications:     Elevated Troponin  History:         Patient has prior history of Echocardiogram examinations, most                  recent 10/02/2022. CHF, COPD; Risk Factors:Hypertension.                  Cardiogenic shock, ESRD.  Sonographer:     Wenda Low Referring Phys:  [id number] [place] L [place] Diagnosing Phys: Ida Rogue MD  Sonographer Comments: Suboptimal apical window and echo performed with patient supine and on artificial respirator. IMPRESSIONS  1. Left ventricular ejection fraction, by estimation, is 50 to 55%. The left ventricle has low normal function. The left ventricle has no regional wall motion abnormalities. There is mild left ventricular hypertrophy. Left ventricular diastolic parameters are consistent with Grade I diastolic dysfunction (impaired relaxation). There is the interventricular septum is flattened in systole and diastole, consistent with right ventricular pressure and volume overload.  2. Right ventricular systolic function is severely reduced. The right ventricular size is moderately enlarged. There is severely elevated pulmonary artery systolic pressure. The estimated right ventricular systolic pressure is [id number] mmHg.  3. Right  atrial size was moderately dilated.  4. The mitral valve is normal in structure. Mild mitral valve regurgitation. No evidence of  mitral stenosis. There is mild late systolic prolapse of both leaflets of the mitral valve.  5. Tricuspid valve regurgitation is mild to moderate.  6. The aortic valve is tricuspid. Aortic valve regurgitation is mild to moderate. No aortic stenosis is present.  7. The inferior vena cava is dilated in size with <50% respiratory variability, suggesting right atrial pressure of 15 mmHg.  8. There is mild dilatation of the aortic root, measuring 44 mm. There is mild dilatation of the ascending aorta, measuring 43 mm. FINDINGS  Left Ventricle: Left ventricular ejection fraction, by estimation, is 50 to 55%. The left ventricle has low normal function. The left ventricle has no regional wall motion abnormalities. The left ventricular internal cavity size was normal in size. There is mild left ventricular hypertrophy. The interventricular septum is flattened in systole and diastole, consistent with right ventricular pressure and volume overload. Left ventricular diastolic parameters are consistent with Grade I diastolic dysfunction (impaired relaxation). Right Ventricle: The right ventricular size is moderately enlarged. No increase in right ventricular wall thickness. Right ventricular systolic function is severely reduced. There is severely elevated pulmonary artery systolic pressure. The tricuspid regurgitant velocity is 3.45 m/s, and with an assumed right atrial pressure of 15 mmHg, the estimated right ventricular systolic pressure is [id number] mmHg. Left Atrium: Left atrial size was normal in size. Right Atrium: Right atrial size was moderately dilated. Pericardium: There is no evidence of pericardial effusion. Mitral Valve: The mitral valve is normal in structure. There is mild late systolic prolapse of both leaflets of the mitral valve. Mild mitral valve regurgitation. No evidence of mitral valve stenosis. MV peak gradient, 3.9 mmHg. The mean mitral valve gradient is 1.0 mmHg. Tricuspid Valve: The tricuspid valve is  normal in structure. Tricuspid valve regurgitation is mild to moderate. No evidence of tricuspid stenosis. Aortic Valve: The aortic valve is tricuspid. Aortic valve regurgitation is mild to moderate. No aortic stenosis is present. Aortic valve mean gradient measures 1.0 mmHg. Aortic valve peak gradient measures 3.9 mmHg. Aortic valve area, by VTI measures 3.79 cm. Pulmonic Valve: The pulmonic valve was normal in structure. Pulmonic valve regurgitation is mild to moderate. No evidence of pulmonic stenosis. Aorta: The aortic root is normal in size and structure. There is mild dilatation of the aortic root, measuring 44 mm. There is mild dilatation of the ascending aorta, measuring 43 mm. Venous: The inferior vena cava is dilated in size with less than 50% respiratory variability, suggesting right atrial pressure of 15 mmHg. IAS/Shunts: No atrial level shunt detected by color flow Doppler.  LEFT VENTRICLE PLAX 2D LVIDd:         4.40 cm   Diastology LVIDs:         3.00 cm   LV e' medial:    6.85 cm/s LV PW:         1.30 cm   LV E/e' medial:  6.3 LV IVS:        1.30 cm   LV e' lateral:   5.98 cm/s LVOT diam:     2.10 cm   LV E/e' lateral: 7.2 LV SV:         68 LV SV Index:   31 LVOT Area:     3.46 cm  RIGHT VENTRICLE RV Basal diam:  5.10 cm RV Mid diam:    5.90 cm RV S prime:  14.50 cm/s TAPSE (M-mode): 1.8 cm LEFT ATRIUM             Index        RIGHT ATRIUM           Index LA diam:        2.90 cm 1.31 cm/m   RA Area:     27.50 cm LA Vol (A2C):   59.3 ml 26.84 ml/m  RA Volume:   101.00 ml 45.71 ml/m LA Vol (A4C):   59.1 ml 26.74 ml/m LA Biplane Vol: 59.4 ml 26.88 ml/m  AORTIC VALVE                    PULMONIC VALVE AV Area (Vmax):    3.22 cm     PV Vmax:       1.00 m/s AV Area (Vmean):   4.15 cm     PV Peak grad:  4.0 mmHg AV Area (VTI):     3.79 cm AV Vmax:           98.70 cm/s AV Vmean:          45.500 cm/s AV VTI:            0.180 m AV Peak Grad:      3.9 mmHg AV Mean Grad:      1.0 mmHg LVOT Vmax:          91.80 cm/s LVOT Vmean:        54.500 cm/s LVOT VTI:          0.197 m LVOT/AV VTI ratio: 1.09  AORTA Ao Root diam: 4.40 cm Ao Asc diam:  4.30 cm MITRAL VALVE               TRICUSPID VALVE MV Area (PHT): 2.10 cm    TR Peak grad:   47.6 mmHg MV Area VTI:   2.38 cm    TR Vmax:        345.00 cm/s MV Peak grad:  3.9 mmHg MV Mean grad:  1.0 mmHg    SHUNTS MV Vmax:       0.99 m/s    Systemic VTI:  0.20 m MV Vmean:      42.3 cm/s   Systemic Diam: 2.10 cm MV Decel Time: 361 msec MV E velocity: 43.00 cm/s MV A velocity: 84.90 cm/s MV E/A ratio:  0.51 Ida Rogue MD Electronically signed by Ida Rogue MD Signature Date/Time: 01/30/2023/4:10:59 PM    Final    DG Abd Portable 1V  Result Date: 01/30/2023 CLINICAL DATA:  NGT placement EXAM: PORTABLE ABDOMEN - 1 VIEW COMPARISON:  01/29/2023 FINDINGS: Image of the upper abdomen demonstrates a NG tube superimposed with the stomach below the diaphragm. IMPRESSION: NG tube appropriately positioned. Electronically Signed   By: Sammie Bench M.D.   On: 01/30/2023 13:12   DG Chest Port 1 View  Result Date: 01/29/2023 CLINICAL DATA:  Intubation EXAM: PORTABLE CHEST 1 VIEW COMPARISON:  01/29/2023 8:30 a.m. FINDINGS: Interval placement of endotracheal tube, tip over the midtrachea, and esophagogastric tube, tip not visualized although below the diaphragm. Cardiomegaly. Small bilateral pleural effusions. Osseous structures unremarkable. IMPRESSION: 1. Interval placement of endotracheal tube, tip over the midtrachea, and esophagogastric tube, tip not visualized although below the diaphragm. 2. Cardiomegaly. 3. Small bilateral pleural effusions. Electronically Signed   By: Delanna Ahmadi M.D.   On: 01/29/2023 17:03   DG Abd 1 View  Result Date: 01/29/2023 CLINICAL DATA:  Orogastric tube placement. EXAM: ABDOMEN -  1 VIEW COMPARISON:  None Available. FINDINGS: Distal tip of nasogastric tube is seen in expected position of gastroesophageal junction. No abnormal bowel  dilatation is noted. IMPRESSION: Distal tip of nasogastric tube is seen in expected position of gastroesophageal junction. Advancement is recommended. Electronically Signed   By: Marijo Conception M.D.   On: 01/29/2023 17:02   DG Chest Portable 1 View  Result Date: 01/29/2023 CLINICAL DATA:  Shortness of breath. EXAM: PORTABLE CHEST 1 VIEW COMPARISON:  01/21/2022 FINDINGS: Leftward patient rotation. The cardio pericardial silhouette is enlarged. Vascular congestion noted with bibasilar atelectasis/infiltrate and probable layering small bilateral effusions. The visualized bony structures of the thorax are unremarkable. Telemetry leads overlie the chest. IMPRESSION: Vascular congestion with bibasilar atelectasis/infiltrate and probable layering small bilateral effusions. Electronically Signed   By: Misty Stanley M.D.   On: 01/29/2023 08:38     TELEMETRY reviewed by me (LT) 02/06/2023 : sinus tachycardia rate low 100s with occasional PVCs  EKG reviewed by me: NSR nonspecific TW changes  Data reviewed by me (LT) 02/06/2023: nephrology note critical care progress note, last 24 hours vitals, imaging, labs, telemetry  Principal Problem:   Shock circulatory ([place]) Active Problems:   Acute on chronic congestive heart failure ([place])   Acute hypoxic respiratory failure (HCC)   Toxic metabolic encephalopathy    ASSESSMENT AND PLAN:  John [name] is a 51yoM with a PMH of HFpEF (50-55%, G1 DD with a D-shaped LV c/w RV overload, mi-mod TR 10/02/2022), COPD (prn 2 L), CKD 4, chronic thrombocytopenia, HTN, hx tobacco use who presented to [facility] ED 01/28/2023 at the request of his nephrologist after outpatient labs showed acutely worsening renal function.  He was brought in by EMS with altered mental status and in circulatory shock requiring vasopressors, CRRT for acute renal failure, and respiratory failure needing mechanical intubation. Troponins checked on admission were elevated and trended 2351, 2252, and 1824.  BNP 2500. Echo this admission redemonstrates preserved LVEF 50-55%, RV failure with severely elevated RVSP (62.52mHg), mod RA dilation, mi-mod TR and MR. Cardiology is consulted on hospital day 5 for assistance with his heart failure.    # acute hypoxic respiratory failure # toxic metabolic encephalopathy # shock In the setting of acute renal failure and AoC HFpEF as below with underlying COPD/pulm HTN.  -extubated 2/21 to [place]  # acute renal failure on CKD IV On torsemide '60mg'$  daily + metolazone 2.'5mg'$  twice weekly, and losartan '50mg'$  daily. Presented with decreased UOP and doubling of Cr per outpatient labs. Now oliguric.  - s/p CRRT, now declining further HD  - agree with palliative care consult   # acute on chronic HFpEF # RV failure, pulm HTN BNP elevated at 2500 on admission, pleural effusions + severe RV dysfunction & cirrhotic changes on imaging. Unclear of trigger for this decompensation, query progressively worsening renal function in the setting of aggressive outpatient diuretics.  Dry weight reportedly ~198lbs. Still clinically hypervolemic appearing, although improving. -GDMT limited by hypotension + renal insufficiency. On midodrine at baseline.   - continue to monitor BMP, further cardiac diagnostics limited by renal function.  - GOC as above as pt is currently declining further HD  # demand ischemia Troponins elevated and trending 2351, 2252, and 1824 on admission. Echo with severe RV dysfunction w/o rWMAs. No known hx of CAD.  Suspect demand in setting of acute renal failure and volume overload with possible underlying CAD. Would consider ischemic / hemodynamic eval (R/LHC) at some point, but coagulopathy and overall hemodynamic  instability, renal insufficiency preclude this currently.  # thrombocytopenia  Chronic in nature, drift from 91K -- low 32K plts, currently 73K without active bleeding.    This patient's plan of care was discussed and created with Dr. Saralyn Pilar and he  is in agreement.  Signed: Tristan Schroeder , PA-C 02/06/2023, 8:15 AM [facility]

## 2023-02-06 NOTE — Evaluation (Signed)
Physical Therapy Evaluation Patient Details Name: John [name] MRN: [id number] DOB: 1946/11/03 Today's Date: 02/06/2023  History of Present Illness  Pt is 77 year old male with a history of essential HTN, stage IV CKD, chronic diastolic and systolic heart failure with cor pulmonale, moderate tricuspid regurgitation and aortic valve regurgitation, ascending aortic aneurysm, CAD, COPD and chronic hypoxemia baseline 2 L/min supplemental oxygen at home, recurrent episodes of hypoxemia, ruptured left quadricep tendon, thrombocytopenia, hyperkalemia, morbid obesity history of anasarca dyslipidemia, and recent admission for acute CHF exacerbation who was brought in by EMS due to worsening altered mental status and circulatory shock.  MD assessment includes: Acute hypoxic respiratory failure in setting of pulmonary edema, pulmonary hypertension, & small bilateral pleural effusions, AKI, thrombocytopenia, leukocytosis, and acute metabolic encephalopathy vs possible hepatic encephalopathy.   Clinical Impression  Pt lethargic but pleasant and agreeable to PT session.  Pt very soft spoken with clear effort needed to speak and was mildly disoriented but was able to provide basic history.  Pt with L femoral temp cath with MD orders provided to proceed with basic mobility with temp cath in place.  Pt required extensive +2 physical assistance with bed mobility tasks and once in sitting presented with poor static sitting balance that did improved somewhat as the session progressed.  Pt was able to perform some low amplitude long arc quad exercises in sitting but with visibly minimal power/strength behind them.  Transfer attempts deferred at this time with pt scheduled to have temp cath removed later this date.  Pt will benefit from PT services in a SNF setting upon discharge to safely address deficits listed in patient problem list for decreased caregiver assistance and eventual return to PLOF.      Recommendations  for follow up therapy are one component of a multi-disciplinary discharge planning process, led by the attending physician.  Recommendations may be updated based on patient status, additional functional criteria and insurance authorization.  Follow Up Recommendations Skilled nursing-short term rehab (<3 hours/day) Can patient physically be transported by private vehicle: No    Assistance Recommended at Discharge Frequent or constant Supervision/Assistance  Patient can return home with the following  Two people to help with walking and/or transfers;Two people to help with bathing/dressing/bathroom;Assistance with cooking/housework;Direct supervision/assist for medications management;Help with stairs or ramp for entrance;Assist for transportation;Direct supervision/assist for financial management    Equipment Recommendations Other (comment) (TBD)  Recommendations for Other Services       Functional Status Assessment Patient has had a recent decline in their functional status and demonstrates the ability to make significant improvements in function in a reasonable and predictable amount of time.     Precautions / Restrictions Precautions Precautions: Fall Restrictions Weight Bearing Restrictions: No Other Position/Activity Restrictions: L femoral temp cath      Mobility  Bed Mobility Overal bed mobility: Needs Assistance Bed Mobility: Rolling, Sidelying to Sit, Sit to Sidelying Rolling: Min assist Sidelying to sit: +2 for physical assistance, Max assist     Sit to sidelying: +2 for physical assistance, Max assist General bed mobility comments: +2 max A for BLE and trunk control    Transfers                   General transfer comment: Deferred    Ambulation/Gait                  Stairs            Wheelchair Mobility    Modified Rankin (Stroke  Patients Only)       Balance Overall balance assessment: Needs assistance Sitting-balance support:  Bilateral upper extremity supported Sitting balance-Leahy Scale: Poor Sitting balance - Comments: Occasional min A for stability in sitting                                     Pertinent Vitals/Pain Pain Assessment Pain Assessment: No/denies pain    Home Living Family/patient expects to be discharged to:: Private residence Living Arrangements: Spouse/significant other Available Help at Discharge: Family;Available 24 hours/day Type of Home: House Home Access: Stairs to enter   [employer] of Steps: 3   Home Layout: One level Home Equipment: Cane - quad;Standard Environmental consultant Additional Comments: Pt lives with [name], long time GF who is retired and available 24/7 per patient    Prior Function Prior Level of Function : Independent/Modified Independent;History of Falls (last six months)             Mobility Comments: Ind amb without an AD limited community distances, one fall in the last 6 months ADLs Comments: See OT eval     Hand Dominance   Dominant Hand: Right    Extremity/Trunk Assessment   Upper Extremity Assessment Upper Extremity Assessment: Generalized weakness    Lower Extremity Assessment Lower Extremity Assessment: Generalized weakness       Communication   Communication: Other (comment) (Slow to respond verbally, soft-spoken with clear effort needed to speak)  Cognition Arousal/Alertness: Lethargic Behavior During Therapy: Flat affect Overall Cognitive Status: No family/caregiver present to determine baseline cognitive functioning                                 General Comments: Pt able to state name and DOB, unable to provided month, extra time and cuing to follow commands        General Comments      Exercises Total Joint Exercises Long Arc Quad: AROM, Strengthening, Both, 5 reps, 10 reps Knee Flexion: AROM, Strengthening, Both, 5 reps, 10 reps Other Exercises Other Exercises: Rollling left/right for core  strengthening and bed mobility training Other Exercises: Static sitting at EOB for core strengthening and improved activity tolerance   Assessment/Plan    PT Assessment Patient needs continued PT services  PT Problem List Decreased activity tolerance;Decreased balance;Decreased mobility;Decreased cognition;Decreased knowledge of use of DME;Decreased safety awareness       PT Treatment Interventions DME instruction;Gait training;Stair training;Therapeutic activities;Functional mobility training;Therapeutic exercise;Balance training;Patient/family education    PT Goals (Current goals can be found in the Care Plan section)  Acute Rehab PT Goals Patient Stated Goal: To get stronger PT Goal Formulation: With patient Time For Goal Achievement: 02/19/23 Potential to Achieve Goals: Fair    Frequency Min 2X/week     Co-evaluation PT/OT/SLP Co-Evaluation/Treatment: Yes Reason for Co-Treatment: Complexity of the patient's impairments (multi-system involvement);For patient/therapist safety PT goals addressed during session: Mobility/safety with mobility;Strengthening/ROM;Balance         AM-PAC PT "6 Clicks" Mobility  Outcome Measure Help needed turning from your back to your side while in a flat bed without using bedrails?: A Lot Help needed moving from lying on your back to sitting on the side of a flat bed without using bedrails?: Total Help needed moving to and from a bed to a chair (including a wheelchair)?: Total Help needed standing up from a chair using your  arms (e.g., wheelchair or bedside chair)?: Total Help needed to walk in hospital room?: Total Help needed climbing 3-5 steps with a railing? : Total 6 Click Score: 7    End of Session Equipment Utilized During Treatment: Oxygen Activity Tolerance: Patient tolerated treatment well Patient left: in bed;Other (comment) (Pt left with OT for completion of OT evaluation) Nurse Communication: Mobility status PT Visit Diagnosis:  Difficulty in walking, not elsewhere classified (R26.2);Muscle weakness (generalized) (M62.81)    Time: [id number] PT Time Calculation (min) (ACUTE ONLY): 34 min   Charges:   PT Evaluation $PT Eval Moderate Complexity: 1 Mod PT Treatments $Therapeutic Activity: 8-22 mins       D. Royetta Asal PT, DPT 02/06/23, 12:10 PM

## 2023-02-06 NOTE — Evaluation (Signed)
Occupational Therapy Evaluation Patient Details Name: John [name] MRN: [id number] DOB: 18-Jul-1946 Today's Date: 02/06/2023   History of Present Illness Pt is 77 year old male with a history of essential HTN, stage IV CKD, chronic diastolic and systolic heart failure with cor pulmonale, moderate tricuspid regurgitation and aortic valve regurgitation, ascending aortic aneurysm, CAD, COPD and chronic hypoxemia baseline 2 L/min supplemental oxygen at home, recurrent episodes of hypoxemia, ruptured left quadricep tendon, thrombocytopenia, hyperkalemia, morbid obesity history of anasarca dyslipidemia, and recent admission for acute CHF exacerbation who was brought in by EMS due to worsening altered mental status and circulatory shock.  MD assessment includes: Acute hypoxic respiratory failure in setting of pulmonary edema, pulmonary hypertension, & small bilateral pleural effusions, AKI, thrombocytopenia, leukocytosis, and acute metabolic encephalopathy vs possible hepatic encephalopathy.   Clinical Impression   Patient presenting with decreased Ind in self care,balance, functional mobility/transfers, endurance, and safety awareness. Patient reports being mod I at baseline and living with significant other. Pt fatigues quickly during session and needing +2 assistance for bed mobility with supervision - min A for sitting balance. Plan for mobility next session once temporary femoral cath is removed. Pt is currently far from baseline.  Patient will benefit from acute OT to increase overall independence in the areas of ADLs, functional mobility, and safety awareness in order to safely discharge to next venue of care.      Recommendations for follow up therapy are one component of a multi-disciplinary discharge planning process, led by the attending physician.  Recommendations may be updated based on patient status, additional functional criteria and insurance authorization.   Follow Up Recommendations   Skilled nursing-short term rehab (<3 hours/day)     Assistance Recommended at Discharge Frequent or constant Supervision/Assistance  Patient can return home with the following Two people to help with walking and/or transfers;Two people to help with bathing/dressing/bathroom;Help with stairs or ramp for entrance;Assist for transportation;Assistance with cooking/housework    Functional Status Assessment  Patient has had a recent decline in their functional status and demonstrates the ability to make significant improvements in function in a reasonable and predictable amount of time.  Equipment Recommendations  Other (comment) (defer to next venue of care)       Precautions / Restrictions Precautions Precautions: Fall Restrictions Weight Bearing Restrictions: No Other Position/Activity Restrictions: L femoral temp cath      Mobility Bed Mobility Overal bed mobility: Needs Assistance Bed Mobility: Rolling, Sidelying to Sit, Sit to Sidelying Rolling: Min assist Sidelying to sit: +2 for physical assistance, Max assist     Sit to sidelying: +2 for physical assistance, Max assist General bed mobility comments: +2 max A for BLE and trunk control    Transfers                   General transfer comment: Deferred      Balance Overall balance assessment: Needs assistance Sitting-balance support: Bilateral upper extremity supported Sitting balance-Leahy Scale: Poor Sitting balance - Comments: Occasional min A for stability in sitting                                   ADL either performed or assessed with clinical judgement   ADL Overall ADL's : Needs assistance/impaired     Grooming: Wash/dry hands;Wash/dry face;Bed level;Supervision/safety;Set up               Lower Body Dressing: Maximal assistance  Vision Patient Visual Report: No change from baseline              Pertinent Vitals/Pain Pain Assessment Pain  Assessment: No/denies pain     Hand Dominance Right   Extremity/Trunk Assessment Upper Extremity Assessment Upper Extremity Assessment: Generalized weakness   Lower Extremity Assessment Lower Extremity Assessment: Generalized weakness       Communication Communication Communication: Other (comment) (softly spoken)   Cognition Arousal/Alertness: Lethargic Behavior During Therapy: Flat affect Overall Cognitive Status: No family/caregiver present to determine baseline cognitive functioning                                 General Comments: Pt able to state name and DOB, unable to provided month, extra time and cuing to follow commands                Home Living Family/patient expects to be discharged to:: Private residence Living Arrangements: Spouse/significant other Available Help at Discharge: Family;Available 24 hours/day Type of Home: House Home Access: Stairs to enter [employer] of Steps: 3   Home Layout: One level     Bathroom Shower/Tub: Teacher, early years/pre: Standard     Home Equipment: Cane - quad;Standard Environmental consultant   Additional Comments: Pt lives with John [name], long time GF who is retired and available 24/7 per patient      Prior Functioning/Environment Prior Level of Function : Independent/Modified Independent;History of Falls (last six months)             Mobility Comments: Ind amb without an AD limited community distances, one fall in the last 6 months ADLs Comments: Pt reports Ind with self care and shared IADL tasks        OT Problem List: Decreased strength;Decreased activity tolerance;Impaired balance (sitting and/or standing);Decreased knowledge of use of DME or AE;Decreased safety awareness;Decreased cognition      OT Treatment/Interventions: Self-care/ADL training;Therapeutic exercise;Therapeutic activities;Energy conservation;DME and/or AE instruction;Patient/family education;Balance training    OT  Goals(Current goals can be found in the care plan section) Acute Rehab OT Goals Patient Stated Goal: to get stronger and return to PLOF OT Goal Formulation: With patient Time For Goal Achievement: 02/20/23 Potential to Achieve Goals: Good ADL Goals Pt Will Perform Grooming: with modified independence;sitting Pt Will Perform Lower Body Dressing: with min assist;sit to/from stand Pt Will Transfer to Toilet: with min assist;ambulating Pt Will Perform Toileting - Clothing Manipulation and hygiene: with min assist;sit to/from stand  OT Frequency: Min 2X/week    Co-evaluation PT/OT/SLP Co-Evaluation/Treatment: Yes Reason for Co-Treatment: Complexity of the patient's impairments (multi-system involvement);For patient/therapist safety PT goals addressed during session: Mobility/safety with mobility;Strengthening/ROM;Balance OT goals addressed during session: Strengthening/ROM;Other (comment) (functional mobility)      AM-PAC OT "6 Clicks" Daily Activity     Outcome Measure Help from another person eating meals?: A Little Help from another person taking care of personal grooming?: A Little Help from another person toileting, which includes using toliet, bedpan, or urinal?: A Lot Help from another person bathing (including washing, rinsing, drying)?: A Lot Help from another person to put on and taking off regular upper body clothing?: A Little Help from another person to put on and taking off regular lower body clothing?: A Lot 6 Click Score: 15   End of Session Nurse Communication: Mobility status  Activity Tolerance: Patient tolerated treatment well Patient left: in bed;with call bell/phone within reach;with bed alarm set  OT Visit Diagnosis: Unsteadiness on feet (R26.81);Repeated falls (R29.6);Muscle weakness (generalized) (M62.81)                Time: [id number] OT Time Calculation (min): 18 min Charges:  OT General Charges $OT Visit: 1 Visit OT Evaluation $OT Eval Moderate  Complexity: 1 [address] [place], OTR/L , CBIS ascom [phone]  02/06/23, 12:53 PM

## 2023-02-06 NOTE — Progress Notes (Signed)
Daily Progress Note   Patient Name: John [name]       Date: 02/06/2023 DOB: 1946-01-26  Age: 77 y.o. MRN#: [id number] Attending Physician: Armando Reichert, MD Primary Care Physician: Alisa Graff, FNP Admit Date: 01/29/2023  Reason for Consultation/Follow-up: Establishing goals of care  HPI/Brief Hospital Review: This is 77 year old male with a history of essential hypertension, on hydralazine and Cozaar, stage IV CKD but has not been on dialysis in the past, has chronic diastolic and systolic heart failure with cor pulmonale, moderate tricuspid regurgitation and aortic valve regurgitation, history of a ascending aortic aneurysm, coronary artery disease with atherosclerosis, COPD and chronic hypoxemia baseline 2 L/min supplemental oxygen at home with recurrent episodes of hypoxemia and remote acute exacerbation of COPD with hypoxemia and hypercapnia 2 months ago history of ruptured left quadricep tendon 2 years ago, chronic thrombocytopenia and erythrocytosis recurrent lower extremity cellulitis, recurrent hyperkalemia, morbid obesity history of anasarca dyslipidemia, recent admission for acute CHF exacerbation requiring Lasix drip and BiPAP support, who was brought in by EMS due to worsening altered mental status and circulatory shock.    Required intubation, CRRT and pressor support this admission. Successfully extubated 2/21. Patient able to voice he DOES NOT want any form of dialysis.  Palliative Medicine consulted for assisting with goals of care conversations.  Subjective: Extensive chart review has been completed prior to meeting patient including labs, vital signs, imaging, progress notes, orders, and available advanced directive documents from current and previous encounters.     Visited with John [name] with colleague Wadie Lessen, NP. John [name] awake and alert. Able to communicate his wishes. He confirms he does not have family or friends other than his long-time girlfriend, John [name] that he would want to participate in decision making.  He clearly and reiterated his desires. He does not want any from of dialysis-he expresses understanding of risk of mortality without dialysis. He voices his acceptance. Discussed resuscitation status. He clearly communicates he wishes to not receive resuscitation measures. He clearly communicates his desire to not be re-intubated if needed. He wishes to be made comfortable. Requests that I return once John [name] is able to visit as he wishes to share his desires with her but fears she will struggle with acceptance.  Met again at bedside with John [name] present. Assisted John [name] with communicating his wishes  and desires as discussed earlier. Again, he is clearly able to communicate his wishes as outlined above. John [name] voices her frustrations. Offered emotional support.  Discussed next steps such as transitioning to comfort care and/or considering Hospice services. John [name] voices his understanding but wishes time to allow John [name] to process his desires made today. He is willing to discuss these options again tomorrow. Encouraged John [name] to process these decisions with the understanding that Mr. Days has been able to clearly communicate his wishes.  Strongly encouraged in the event of decompensation prior to ongoing decision making a transition to comfort care be made at that time.  PMT to continue to follow for ongoing needs and support.  Objective:  Physical Exam Constitutional:      General: He is not in acute distress.    Appearance: He is ill-appearing.  Pulmonary:     Effort: Pulmonary effort is normal. No respiratory distress.  Skin:    General: Skin is warm and dry.  Neurological:     Mental Status: He is alert.              Vital Signs: BP 104/78   Pulse 84   Temp 98.3 F (36.8 C)   Resp 17   Ht 5' 11"$  (1.803 m)   Wt 65.4 kg   SpO2 96%   BMI 20.11 kg/m  SpO2: SpO2: 96 % O2 Device: O2 Device: Nasal Cannula O2 Flow Rate: O2 Flow Rate (L/min): 3 L/min   Palliative Care Assessment & Plan   Assessment/Recommendation/Plan  DNR Ongoing conversations needed for [place] PMT to continue to follow for ongoing needs and support   Care plan was discussed with nursing staff and medical team.  Thank you for allowing the Palliative Medicine Team to assist in the care of this patient.  Total time:  35 minutes  Theodoro Grist, DNP, AGNP-C Palliative Medicine   Please contact Palliative Medicine Team phone at [phone] for questions and concerns.

## 2023-02-06 NOTE — Progress Notes (Signed)
Nutrition Follow Up Note   DOCUMENTATION CODES:   Not applicable  INTERVENTION:   RD will monitor for diet advancement vs the need for nutrition support.   MVI po daily with diet advancement   Vitamin C 52m po BID with diet advancement   NUTRITION DIAGNOSIS:   Inadequate oral intake related to inability to eat (pt sedated and ventilated) as evidenced by NPO status.  GOAL:   Patient will meet greater than or equal to 90% of their needs -previously met with tube feeds   MONITOR:   Diet advancement, Labs, Weight trends, I & O's, Skin  ASSESSMENT:   77y/o male with h/o COPD, CHF, HTN, CKD IV, aortic aneurysm and HLD who is admitted with circulatory shock, CHF, encephalopaty and new ESRD. Pt with cirrrhosis noted on CT scan.  Pt extubated 2/21. Pt seen by SLP today and was not be able to be placed on a diet. Pt is asking for food today. RD will monitor for diet advancement vs the need for nutrition support. Pt has decided against any further dialysis treatments or re-intubation. Per chart, pt appears to be down ~70lbs since admission; RD unsure if bed weights are correct.   Medications reviewed and include: aspirin, colace, folic acid, heparin, rena-vit, thiamine  Labs reviewed: K 3.8 wnl, BUN 89(H), creat 2.81(H) Cbgs- 91, 81, 100 x 24 hrs   Diet Order:   Diet Order             Diet NPO time specified Except for: Other (See Comments)  Diet effective now                  EDUCATION NEEDS:   No education needs have been identified at this time  Skin:  Skin Assessment: Reviewed RN Assessment (Partial thickness wounds LE and right upper back; LE related to edema)  Last BM:  2/21- type 6  Height:   Ht Readings from Last 1 Encounters:  01/29/23 5' 11"$  (1.803 m)    Weight:   Wt Readings from Last 1 Encounters:  02/06/23 65.4 kg    Ideal Body Weight:  78 kg  BMI:  Body mass index is 20.11 kg/m.  Estimated Nutritional Needs:   Kcal:   1800-2100kcal/day  Protein:  90-105g/day  Fluid:  2.0L/day  CKoleen DistanceMS, RD, LDN Please [facility] and/or RD on-call/weekend/after hours pager

## 2023-02-06 NOTE — Evaluation (Signed)
Clinical/Bedside Swallow Evaluation Patient Details  Name: John [name] MRN: [id number] Date of Birth: 19-Jan-1946  Today's Date: 02/06/2023 Time: SLP Start Time (ACUTE ONLY): [id number] SLP Stop Time (ACUTE ONLY): [id number] SLP Time Calculation (min) (ACUTE ONLY): 20 min  Past Medical History:  Past Medical History:  Diagnosis Date   CHF (congestive heart failure) (HCC)    EF 45% in 2021   Chronic kidney disease    COPD (chronic obstructive pulmonary disease) ([place]) 02/16/2020   Hyperlipidemia    Hypertension    Pneumonia 2021   Past Surgical History:  Past Surgical History:  Procedure Laterality Date   CATARACT EXTRACTION W/PHACO Left 10/31/2021   Procedure: CATARACT EXTRACTION PHACO AND INTRAOCULAR LENS PLACEMENT ([place]) LEFT VISION BLUE 3.45 00:48.0;  Surgeon: Leandrew Koyanagi, MD;  Location: [place];  Service: Ophthalmology;  Laterality: Left;   CATARACT EXTRACTION W/PHACO Right 11/14/2021   Procedure: CATARACT EXTRACTION PHACO AND INTRAOCULAR LENS PLACEMENT ([facility]) RIGHT 8.59 01:10.5;  Surgeon: Leandrew Koyanagi, MD;  Location: [place];  Service: Ophthalmology;  Laterality: Right;   INGUINAL HERNIA REPAIR Right 02/04/2017   Procedure: HERNIA REPAIR INGUINAL ADULT;  Surgeon: Leonie Green, MD;  Location: [facility];  Service: General;  Laterality: Right;   NO PAST SURGERIES     QUADRICEPS TENDON REPAIR Left 02/15/2020   Procedure: REPAIR QUADRICEP TENDON;  Surgeon: Corky Mull, MD;  Location: [facility];  Service: Orthopedics;  Laterality: Left;   HPI:  Per H&P, pt is 77 year old male with a history of essential hypertension, on hydralazine and Cozaar, stage IV CKD but has not been on dialysis in the past, has chronic diastolic and systolic heart failure with cor pulmonale, moderate tricuspid regurgitation and aortic valve regurgitation, history of a ascending aortic aneurysm, coronary artery disease with atherosclerosis, COPD and chronic hypoxemia baseline 2  L/min supplemental oxygen at home with recurrent episodes of hypoxemia and remote acute exacerbation of COPD with hypoxemia and hypercapnia 2 months ago history of ruptured left quadricep tendon 2 years ago, chronic thrombocytopenia and erythrocytosis recurrent lower extremity cellulitis, recurrent hyperkalemia, morbid obesity history of anasarca dyslipidemia, recent admission for acute CHF exacerbation requiring Lasix drip and BiPAP support, who was brought in by EMS due to worsening altered mental status and circulatory shock.  I was able to meet with wife at bedside during my evaluation who shares patient has been sleeping majority of each day over the last week has been on arousable and received phone call from medical provider regarding worsening kidney function which prompted ER evaluation.  He had chest x-ray performed while in the ER showing bilateral pleural effusions with vascular congestion and atelectasis/infiltrate.  Patient in circulatory shock with Levophed support on admission in ER.  PCCM consultation for admission to medical intensive care unit with circulatory shock consistent with acute on chronic renal failure with CHF exacerbation. Pt inubated on 2/14 and extubated on 2/21.    Assessment / Plan / Recommendation  Clinical Impression   Pt alert, cooperative, and weak during eval. Weakness c/b low vocal intensity and slow/shaky movement when feeding. Pt's wife arrived during eval. Pt left sitting up in bed w/ bed alarm set, call button in reach, and wife present.  OF NOTE: Pt intubated on 2/14 and recently extubated on 2/21 (8 days). OF NOTE: Pt w/ mild SOB upon SLP entrance prior to administration of po's.  Pt on 3L O2 via [place]; afebrile; WBC WNL.  OM exam completed and notable for general symmetrical lingual weakness and slow  lingual movement. Noted weak cough and missing dentition. Pt also w/ low intensity vocal quality but intelligible in majority of conversational speech.  While no  overt s/s of oropharyngeal dysphagia, pt at increased risk of aspiration in setting of recent lengthy intubation, deconditioning, and increased WOB/SOB. Following General aspiration precautions can help reduce risk of aspiration/aspiration pneumonia.  Administered trials of thin liquids via cup and puree. Pt denied ice chips despite encouragement. Pt self fed w/ min/mod assistance for set up and to raise cup/spoon to mouth. Noted pt's movements to feed moderately slow. During oral phase, pt exhibited good bolus management, timely A-P bolus transit, and clear oral cavity post-swallow. During pharyngeal phase, pt exhibited seemingly timely pharyngeal swallow and clear vocal quality post-po's. No s/s of aspiration such as coughing no overt throat clearing noted. Near end of session pt dropped water cup d/t to weakness. Noted consistent increased SOB/WOB w/ po's.  Pt/wife educated on general aspiration precautions, diet recommendation, and SLP POC. Pt/wife appreciative and agreed.  Recommend continue NPO diet w/ exception of sips of water. Recommend meds via alternative needs. ST services will monitor in next 1-2 days for pt improvement and potential diet upgrade. Follow aspiration precaution w/ all po's. Pt/RN updated and agreed.  SLP Visit Diagnosis: Dysphagia, unspecified (R13.10)    Aspiration Risk  Moderate aspiration risk    Diet Recommendation   Recommend continue NPO diet w/ exception of sips of water.   Medication Administration: Via alternative means    Other  Recommendations Oral Care Recommendations: Oral care QID;Oral care prior to ice chip/H20    Recommendations for follow up therapy are one component of a multi-disciplinary discharge planning process, led by the attending physician.  Recommendations may be updated based on patient status, additional functional criteria and insurance authorization.  Follow up Recommendations Follow physician's recommendations for discharge plan and  follow up therapies      Assistance Recommended at Discharge  FULL  Functional Status Assessment Patient has had a recent decline in their functional status and demonstrates the ability to make significant improvements in function in a reasonable and predictable amount of time.  Frequency and Duration min 2x/week  2 weeks       Prognosis Prognosis for improved oropharyngeal function: Good Barriers to Reach Goals: Severity of deficits;Time post onset      Swallow Study   General Date of Onset: 01/29/23 HPI: Per H&P, pt is 77 year old male with a history of essential hypertension, on hydralazine and Cozaar, stage IV CKD but has not been on dialysis in the past, has chronic diastolic and systolic heart failure with cor pulmonale, moderate tricuspid regurgitation and aortic valve regurgitation, history of a ascending aortic aneurysm, coronary artery disease with atherosclerosis, COPD and chronic hypoxemia baseline 2 L/min supplemental oxygen at home with recurrent episodes of hypoxemia and remote acute exacerbation of COPD with hypoxemia and hypercapnia 2 months ago history of ruptured left quadricep tendon 2 years ago, chronic thrombocytopenia and erythrocytosis recurrent lower extremity cellulitis, recurrent hyperkalemia, morbid obesity history of anasarca dyslipidemia, recent admission for acute CHF exacerbation requiring Lasix drip and BiPAP support, who was brought in by EMS due to worsening altered mental status and circulatory shock.  I was able to meet with wife at bedside during my evaluation who shares patient has been sleeping majority of each day over the last week has been on arousable and received phone call from medical provider regarding worsening kidney function which prompted ER evaluation.  He had chest x-ray performed  while in the ER showing bilateral pleural effusions with vascular congestion and atelectasis/infiltrate.  Patient in circulatory shock with Levophed support on  admission in ER.  PCCM consultation for admission to medical intensive care unit with circulatory shock consistent with acute on chronic renal failure with CHF exacerbation. Pt inubated on 2/14 and extubated on 2/21. Type of Study: Bedside Swallow Evaluation Diet Prior to this Study: NPO Temperature Spikes Noted: No (WBC 10.2) Respiratory Status: Nasal cannula (3L O2) History of Recent Intubation: Yes Total duration of intubation (days): 8 days Date extubated: 02/05/23 Behavior/Cognition: Alert;Cooperative;Pleasant mood;Lethargic/Drowsy Oral Cavity Assessment: Within Functional Limits Oral Care Completed by SLP: No Oral Cavity - Dentition: Missing dentition Vision: Functional for self-feeding Self-Feeding Abilities: Needs assist;Able to feed self Patient Positioning: Upright in bed Baseline Vocal Quality: Low vocal intensity Volitional Cough: Weak Volitional Swallow: Able to elicit    Oral/Motor/Sensory Function Overall Oral Motor/Sensory Function: Other (comment) (General weakness/slow movements)   Ice Chips Ice chips: Not tested   Thin Liquid Thin Liquid: Impaired Presentation: Spoon;Cup;Self Fed (~ 3 oz) Oral Phase Impairments: Reduced lingual movement/coordination Other Comments:  (increased SOB/WOB)    Nectar Thick Nectar Thick Liquid: Not tested   Honey Thick Honey Thick Liquid: Not tested   Puree Puree: Impaired Presentation: Self Fed;Spoon Oral Phase Impairments: Other (comment) (General weakness causing difficulty self feeding and increased SOB/WOB)   Solid     Solid: Not tested     Randall Hiss Graduate Clinician [place], Speech Pathology   Randall Hiss 02/06/2023,2:08 PM

## 2023-02-07 DIAGNOSIS — R579 Shock, unspecified: Secondary | ICD-10-CM | POA: Diagnosis not present

## 2023-02-07 DIAGNOSIS — Z7189 Other specified counseling: Secondary | ICD-10-CM | POA: Diagnosis not present

## 2023-02-07 LAB — CBC WITH DIFFERENTIAL/PLATELET
Abs Immature Granulocytes: 0.07 10*3/uL (ref 0.00–0.07)
Basophils Absolute: 0 10*3/uL (ref 0.0–0.1)
Basophils Relative: 0 %
Eosinophils Absolute: 0.1 10*3/uL (ref 0.0–0.5)
Eosinophils Relative: 1 %
HCT: 41.4 % (ref 39.0–52.0)
Hemoglobin: 13.2 g/dL (ref 13.0–17.0)
Immature Granulocytes: 1 %
Lymphocytes Relative: 7 %
Lymphs Abs: 0.8 10*3/uL (ref 0.7–4.0)
MCH: 32.8 pg (ref 26.0–34.0)
MCHC: 31.9 g/dL (ref 30.0–36.0)
MCV: 102.7 fL — ABNORMAL HIGH (ref 80.0–100.0)
Monocytes Absolute: 1.6 10*3/uL — ABNORMAL HIGH (ref 0.1–1.0)
Monocytes Relative: 12 %
Neutro Abs: 10.2 10*3/uL — ABNORMAL HIGH (ref 1.7–7.7)
Neutrophils Relative %: 79 %
Platelets: 83 10*3/uL — ABNORMAL LOW (ref 150–400)
RBC: 4.03 MIL/uL — ABNORMAL LOW (ref 4.22–5.81)
RDW: 15.9 % — ABNORMAL HIGH (ref 11.5–15.5)
WBC: 12.7 10*3/uL — ABNORMAL HIGH (ref 4.0–10.5)
nRBC: 0 % (ref 0.0–0.2)

## 2023-02-07 LAB — GLUCOSE, CAPILLARY
Glucose-Capillary: 114 mg/dL — ABNORMAL HIGH (ref 70–99)
Glucose-Capillary: 133 mg/dL — ABNORMAL HIGH (ref 70–99)
Glucose-Capillary: 150 mg/dL — ABNORMAL HIGH (ref 70–99)
Glucose-Capillary: 159 mg/dL — ABNORMAL HIGH (ref 70–99)
Glucose-Capillary: 126 mg/dL — ABNORMAL HIGH (ref 70–99)

## 2023-02-07 LAB — RENAL FUNCTION PANEL
Albumin: 2.9 g/dL — ABNORMAL LOW (ref 3.5–5.0)
Anion gap: 8 (ref 5–15)
BUN: 93 mg/dL — ABNORMAL HIGH (ref 8–23)
CO2: 26 mmol/L (ref 22–32)
Calcium: 9.2 mg/dL (ref 8.9–10.3)
Chloride: 108 mmol/L (ref 98–111)
Creatinine, Ser: 2.87 mg/dL — ABNORMAL HIGH (ref 0.61–1.24)
GFR, Estimated: 22 mL/min — ABNORMAL LOW (ref >60)
Glucose, Bld: 143 mg/dL — ABNORMAL HIGH (ref 70–99)
Phosphorus: 3.6 mg/dL (ref 2.5–4.6)
Potassium: 4 mmol/L (ref 3.5–5.1)
Sodium: 142 mmol/L (ref 135–145)

## 2023-02-07 LAB — MAGNESIUM: Magnesium: 2.6 mg/dL — ABNORMAL HIGH (ref 1.7–2.4)

## 2023-02-07 MED ORDER — VITAMIN C 500 MG PO TABS
500.0000 mg | ORAL_TABLET | Freq: Two times a day (BID) | ORAL | Status: DC
  Administered 2023-02-07 – 2023-02-18 (×22): 500 mg via ORAL
  Filled 2023-02-07 (×23): qty 1

## 2023-02-07 MED ORDER — ENSURE ENLIVE PO LIQD
237.0000 mL | Freq: Three times a day (TID) | ORAL | Status: DC
  Administered 2023-02-07 – 2023-02-12 (×5): 237 mL via ORAL

## 2023-02-07 MED ORDER — TORSEMIDE 20 MG PO TABS: 60.0000 mg | ORAL_TABLET | Freq: Every day | ORAL | Status: DC

## 2023-02-07 MED ORDER — METOPROLOL TARTRATE 25 MG PO TABS
12.5000 mg | ORAL_TABLET | Freq: Two times a day (BID) | ORAL | Status: DC
  Administered 2023-02-07 – 2023-02-18 (×20): 12.5 mg via ORAL
  Filled 2023-02-07 (×24): qty 1

## 2023-02-07 MED ORDER — DICLOFENAC SODIUM 1 % EX GEL
4.0000 g | Freq: Four times a day (QID) | CUTANEOUS | Status: DC | PRN
  Administered 2023-02-07 – 2023-02-13 (×2): 4 g via TOPICAL
  Filled 2023-02-07: qty 100

## 2023-02-07 MED ORDER — THIAMINE MONONITRATE 100 MG PO TABS
100.0000 mg | ORAL_TABLET | Freq: Every day | ORAL | Status: DC
  Administered 2023-02-08 – 2023-02-18 (×11): 100 mg via ORAL
  Filled 2023-02-07 (×11): qty 1

## 2023-02-07 MED ORDER — FUROSEMIDE 10 MG/ML IJ SOLN
60.0000 mg | Freq: Once | INTRAMUSCULAR | Status: AC
  Administered 2023-02-07: 60 mg via INTRAVENOUS
  Filled 2023-02-07: qty 6

## 2023-02-07 MED ORDER — METOPROLOL SUCCINATE ER 25 MG PO TB24
12.5000 mg | ORAL_TABLET | Freq: Every day | ORAL | Status: DC
  Filled 2023-02-07: qty 0.5

## 2023-02-07 MED ORDER — ADULT MULTIVITAMIN W/MINERALS CH
1.0000 | ORAL_TABLET | Freq: Every day | ORAL | Status: DC
  Administered 2023-02-08 – 2023-02-13 (×6): 1 via ORAL
  Filled 2023-02-07 (×6): qty 1

## 2023-02-07 NOTE — Progress Notes (Signed)
[name]  ROUNDING NOTE   Subjective:   [name] was admitted to [facility] on 01/29/2023 for Peripheral edema [R60.9] Shock circulatory ([place]) [R57.9] Acute on chronic congestive heart failure, unspecified heart failure type ([place]) [I50.9] Acute renal failure superimposed on chronic kidney disease, unspecified acute renal failure type, unspecified CKD stage ([place]) [N17.9, N18.9]  Patient seen sitting up in bed Friend present at bedside Patient remains on 3 L nasal cannula, satting 90 to 92% Alert and oriented with slow response to questions Interested in discharge planning Home diuretic therapy.  Objective:  Vital signs in last 24 hours:  Temp:  [97.7 F (36.5 C)-99 F (37.2 C)] 99 F (37.2 C) (02/23 0800) Pulse Rate:  [83-113] 113 (02/23 1000) Resp:  [11-31] 28 (02/23 1000) BP: (103-155)/(69-87) 155/86 (02/23 1000) SpO2:  [90 %-95 %] 91 % (02/23 1000) Weight:  [64.5 kg] 64.5 kg (02/23 0500)  Weight change: -8 kg Filed Weights   02/05/23 1518 02/06/23 0500 02/07/23 0500  Weight: 54.9 kg 65.4 kg 64.5 kg    Intake/Output: I/O last 3 completed shifts: In: 500 [P.O.:500] Out: 2556 [Urine:2555; Stool:1]   Intake/Output this shift:  Total I/O In: 250 [P.O.:250] Out: -   Physical Exam: General: Critically ill  Head: Normocephalic, atraumatic   Eyes: Anicteric  Neck: Supple  Lungs:  Basilar rhonchi, normal effort, [place] O2  Heart: regular, tachycardic  Abdomen:  Soft, nontender  Extremities: + dependent edema.    Neurologic: Alert and oriented  Skin: Left lower leg wound in clean and dry dressing  Access: None    Basic Metabolic Panel: Recent Labs  Lab 02/03/23 0520 02/03/23 1735 02/04/23 0305 02/04/23 0400 02/05/23 0411 02/06/23 0520 02/07/23 0629  NA 136 134* 135  --  137 141 142  K 2.9* 4.0 3.5  --  4.0 3.8 4.0  CL 108 102 103  --  104 107 108  CO2 '24 26 26  '$ --  '28 28 26  '$ GLUCOSE 143* 153* 168*  --  153* 112* 143*  BUN 43* 55* 50*  --   71* 89* 93*  CREATININE 1.28* 1.87* 1.75*  --  2.53* 2.81* 2.87*  CALCIUM 7.8* 9.0 8.8*  --  9.2 9.3 9.2  MG 1.8  --   --  2.3 2.5* 2.4 2.6*  PHOS 1.8* 3.9 2.2*  --  3.5 3.4 3.6     Liver Function Tests: Recent Labs  Lab 02/03/23 0520 02/03/23 1735 02/04/23 0305 02/05/23 0411 02/06/23 0520 02/07/23 0629  AST 17  --   --   --   --   --   ALT 10  --   --   --   --   --   ALKPHOS 50  --   --   --   --   --   BILITOT 1.6*  --   --   --   --   --   PROT 5.0*  --   --   --   --   --   ALBUMIN 2.3*  2.4* 2.8* 2.7* 2.8* 2.6* 2.9*    No results for input(s): "LIPASE", "AMYLASE" in the last 168 hours. Recent Labs  Lab 02/03/23 0956 02/05/23 1021  AMMONIA 45* 22     CBC: Recent Labs  Lab 02/03/23 0956 02/04/23 0400 02/05/23 0411 02/06/23 0520 02/07/23 0629  WBC 14.0* 13.3* 11.9* 10.2 12.7*  NEUTROABS 11.9*  --   --  8.0* 10.2*  HGB 14.6 13.6 13.5 12.6* 13.2  HCT 46.4 43.0 42.7 40.4 41.4  MCV 103.1* 101.7* 103.4* 103.6* 102.7*  PLT 38* 38* 57* 73* 83*     Cardiac Enzymes: No results for input(s): "CKTOTAL", "CKMB", "CKMBINDEX", "TROPONINI" in the last 168 hours.  BNP: Invalid input(s): "POCBNP"  CBG: Recent Labs  Lab 02/06/23 1629 02/06/23 1941 02/06/23 2329 02/07/23 0320 02/07/23 0732  GLUCAP 124* 137* 126* 114* 133*     Microbiology: Results for orders placed or performed during the hospital encounter of 01/29/23  Culture, Respiratory w Gram Stain     Status: None   Collection Time: 01/29/23  3:45 AM   Specimen: Tracheal Aspirate; Respiratory  Result Value Ref Range Status   Specimen Description   Final    TRACHEAL ASPIRATE Performed at [facility], [address], [place], [place] [address]    Special Requests   Final    NONE Performed at [facility], [address] [place], [place] [address]    Gram Stain   Final    MODERATE WBC PRESENT,BOTH PMN AND MONONUCLEAR NO ORGANISMS SEEN    Culture   Final    RARE Normal  respiratory flora-no Staph aureus or Pseudomonas seen Performed at [facility] [address], [place], [place] [address]    Report Status 02/01/2023 FINAL  Final  Culture, blood (Routine X 2) w Reflex to ID Panel     Status: None   Collection Time: 01/29/23  1:10 PM   Specimen: BLOOD  Result Value Ref Range Status   Specimen Description BLOOD BLOOD LEFT ARM  Final   Special Requests   Final    BOTTLES DRAWN AEROBIC AND ANAEROBIC Blood Culture adequate volume   Culture   Final    NO GROWTH 5 DAYS Performed at [facility], [address], [place], [place] [address]    Report Status 02/03/2023 FINAL  Final  MRSA Next Gen by PCR, Nasal     Status: None   Collection Time: 01/29/23  1:47 PM   Specimen: Nasal Mucosa; Nasal Swab  Result Value Ref Range Status   MRSA by PCR Next Gen NOT DETECTED NOT DETECTED Final    Comment: (NOTE) The GeneXpert MRSA Assay (FDA approved for NASAL specimens only), is one component of a comprehensive MRSA colonization surveillance program. It is not intended to diagnose MRSA infection nor to guide or monitor treatment for MRSA infections. Test performance is not FDA approved in patients less than 60 years old. Performed at [facility], [address] [place], [place] [address]   Culture, blood (Routine X 2) w Reflex to ID Panel     Status: None   Collection Time: 01/29/23  2:10 PM   Specimen: BLOOD  Result Value Ref Range Status   Specimen Description BLOOD A-LINE  Final   Special Requests   Final    BOTTLES DRAWN AEROBIC AND ANAEROBIC Blood Culture adequate volume   Culture   Final    NO GROWTH 5 DAYS Performed at [facility], [address] [place], [place] [address]    Report Status 02/03/2023 FINAL  Final    Coagulation Studies: No results for input(s): "LABPROT", "INR" in the last 72 hours.   Urinalysis: No results for input(s): "COLORURINE", "LABSPEC", "PHURINE", "GLUCOSEU", "HGBUR",  "BILIRUBINUR", "KETONESUR", "PROTEINUR", "UROBILINOGEN", "NITRITE", "LEUKOCYTESUR" in the last 72 hours.  Invalid input(s): "APPERANCEUR"     Imaging: No results found.   Medications:      aspirin  81 mg Oral Daily  Chlorhexidine Gluconate Cloth  6 each Topical Daily   docusate sodium  100 mg Oral BID   folic acid  1 mg Oral Daily   heparin injection (subcutaneous)  5,000 Units Subcutaneous Q8H   metoprolol tartrate  12.5 mg Oral BID   multivitamin  1 tablet Oral QHS   [START ON 02/08/2023] thiamine  100 mg Oral Daily   docusate sodium, mouth rinse, polyethylene glycol  Assessment/ Plan:  [name] is a 77 y.o. black male with chronic diastolic congestive heart failure, hypertension, COPD, hyperlipidemia, who is admitted to [facility] on 01/29/2023 for Peripheral edema [R60.9] Shock circulatory ([place]) [R57.9] Acute on chronic congestive heart failure, unspecified heart failure type ([place]) [I50.9] Acute renal failure superimposed on chronic kidney disease, unspecified acute renal failure type, unspecified CKD stage ([place]) [N17.9, N18.9]  Acute Kidney Injury on chronic kidney disease stage IV: baseline creatinine of 2.67, GFR of 24 on 01/06/23. History of bland urine. Acute kidney injury most likely secondary to cardiorenal syndrome. Chronic kidney disease secondary to hypertension.  Creatinine continues to slowly rise however patient made adequate urine output yesterday, 1.8 L.   Palliative care consulted and discussed GOC with patient and family. Patient agreeable to DNR and will consider comfort measures at later time. Patient denies further dialysis or intubation.  Appreciate staff removing HD temp cath.   Acute exacerbation of chronic diastolic congestive heart failure and acute respiratory failure requiring intubation and mechanical ventilation.   Extubated on 02/05/23 Remains on 3L [place]  Hypotension with cardiogenic shock requiring vasopressors:  No pressors.   LOS:  9 [place] 2/23/202411:07 AM

## 2023-02-07 NOTE — Progress Notes (Signed)
PHARMACIST - PHYSICIAN COMMUNICATION  CONCERNING: IV to Oral Route Change Policy  RECOMMENDATION: This patient is receiving thiamine by the intravenous route.  Based on criteria approved by the Pharmacy and Therapeutics Committee, the intravenous medication(s) is/are being converted to the equivalent oral dose form(s).  DESCRIPTION: These criteria include: The patient is eating (either orally or via tube) and/or has been taking other orally administered medications for a least 24 hours The patient has no evidence of active gastrointestinal bleeding or impaired GI absorption (gastrectomy, short bowel, patient on TNA or NPO).  If you have questions about this conversion, please contact the [place], [facility] 02/07/2023 8:59 AM

## 2023-02-07 NOTE — Progress Notes (Signed)
Daily Progress Note   Patient Name: John [name]       Date: 02/07/2023 DOB: 06/19/1946  Age: 77 y.o. MRN#: [id number] Attending Physician: Sidney Ace, MD Primary Care Physician: Alisa Graff, FNP Admit Date: 01/29/2023  Reason for Consultation/Follow-up: Establishing goals of care  Subjective: Notes and labs reviewed. In to see patient. Patient states he does not want dialysis. Girlfriend [name] in to bedside. With discussion, she verbalizes concern for his ability to accurately make decisions. Patient is able to tell me his name, that he is in a [facility] hospital facility, and that he has problems with his kidneys. He is able to tell me dialysis cleans the blood. He tells me the year is 53, the president is Barbette Or, and the president prior to [place] was [employer].   We discussed his status, and current decisions. He confirms DNR/DNI status. He states he would not want dialysis, but was unable to articulate why he would not want it, or what would happen without dialysis if it were needed. Girlfriend discusses with him that she would like for him to have dialysis if needed. With conversation, he is amenable to dialysis at this time. Team made aware. Girlfriend states she used to be a Marine scientist in the [employer] and also worked with radiology.    They tell me patient's parents are deceased. He had 1 brother who is deceased. He has 1 son, but he has had no contact with him since he was 76 years old. They state they do not know how to reach him.   Length of Stay: 9  Current Medications: Scheduled Meds:   aspirin  81 mg Oral Daily   Chlorhexidine Gluconate Cloth  6 each Topical Daily   docusate sodium  100 mg Oral BID   folic acid  1 mg Oral Daily   heparin injection (subcutaneous)  5,000 Units  Subcutaneous Q8H   metoprolol tartrate  12.5 mg Oral BID   multivitamin  1 tablet Oral QHS   [START ON 02/08/2023] thiamine  100 mg Oral Daily   torsemide  60 mg Oral Daily    Continuous Infusions:   PRN Meds: docusate sodium, mouth rinse, polyethylene glycol  Physical Exam Pulmonary:     Effort: Pulmonary effort is normal.  Neurological:     Mental Status: He is alert.  Vital Signs: BP 133/86 (BP Location: Right Arm)   Pulse (!) 102   Temp 99.1 F (37.3 C) (Oral)   Resp (!) 27   Ht '5\' 11"'$  (1.803 m)   Wt 64.5 kg   SpO2 (!) 87%   BMI 19.83 kg/m  SpO2: SpO2: (!) 87 % O2 Device: O2 Device: Nasal Cannula O2 Flow Rate: O2 Flow Rate (L/min): (S) 4 L/min  Intake/output summary:  Intake/Output Summary (Last 24 hours) at 02/07/2023 1402 Last data filed at 02/07/2023 [id number] Gross per 24 hour  Intake 750 ml  Output 1701 ml  Net -951 ml   LBM: Last BM Date : 02/06/23 Baseline Weight: Weight: 98.9 kg Most recent weight: Weight: 64.5 kg   Patient Active Problem List   Diagnosis Date Noted   Toxic metabolic encephalopathy 0000000   Shock circulatory ([place]) 01/29/2023   Impaired fasting glucose 11/24/2022   Hypokalemia 11/24/2022   Acute on chronic respiratory failure with hypoxia and hypercapnia (HCC) 11/21/2022   Obesity (BMI 30-39.9) 11/21/2022   Anasarca 11/21/2022   Acute kidney injury superimposed on chronic kidney disease ([place]) 11/20/2022   Cellulitis 10/07/2022   Acute on chronic diastolic CHF (congestive heart failure) ([place]) 10/02/2022   Myocardial injury 10/02/2022   Hyperkalemia 10/02/2022   Acute hypoxic respiratory failure ([place]) 01/28/2022   Cardiorenal syndrome, stage 1-4 or unspecified chronic kidney disease, with heart failure ([place]) 01/28/2022   Acute left systolic heart failure ([place]) 01/28/2022   Cor pulmonale, acute ([place]) 01/28/2022   Renal failure syndrome 01/28/2022   CKD (chronic kidney disease), stage IV ([place]) 01/28/2022   COPD  exacerbation ([place]) 01/28/2022   Cardiogenic shock ([place]) 01/28/2022   Erythrocytosis 12/20/2021   Thrombocytopenia ([place]) 12/20/2021   Acute on chronic congestive heart failure ([place]) 06/21/2020   Hypertension    COPD (chronic obstructive pulmonary disease) ([place]) 02/16/2020   Rupture of left quadriceps tendon 02/15/2020   Community acquired pneumonia 09/01/2019   Pneumonia 09/01/2019    Palliative Care Assessment & Plan     Recommendations/Plan: Patient stating he is amenable to dialysis at this time.    Code Status:    Code Status Orders  (From admission, onward)           Start     Ordered   02/06/23 1518  Do not attempt resuscitation (DNR)  Continuous       Question Answer Comment  If patient has no pulse and is not breathing Do Not Attempt Resuscitation   If patient has a pulse and/or is breathing: Medical Treatment Goals LIMITED ADDITIONAL INTERVENTIONS: Use medication/IV fluids and cardiac monitoring as indicated; Do not use intubation or mechanical ventilation (DNI), also provide comfort medications.  Transfer to Progressive/Stepdown as indicated, avoid Intensive Care.   Consent: Discussion documented in EHR or advanced directives reviewed      02/06/23 1518           Code Status History     Date Active Date Inactive Code Status Order ID Comments User Context   01/29/2023 1010 02/06/2023 1518 Full Code [id number]  Ottie Glazier, MD ED   11/20/2022 1430 11/27/2022 1838 Full Code [id number]  Jose Persia, MD ED   10/02/2022 1141 10/15/2022 1721 Full Code [id number]  Ivor Costa, MD ED   01/28/2022 1820 02/06/2022 1949 Full Code [id number]  Flora Lipps, MD ED   06/21/2020 1248 07/03/2020 1705 Full Code [id number]  Collier Bullock, MD ED   02/15/2020 1959 02/17/2020 1820 Full Code [id number]  Poggi, Marshall Cork, MD Inpatient  02/15/2020 1328 02/15/2020 1959 Full Code [id number]  Corky Mull, MD Inpatient   09/01/2019 1605 09/03/2019 2057 Full Code [id number]  Vaughan Basta,  MD Inpatient       Prognosis:  Unable to determine    Care plan was discussed with RN and team  Thank you for allowing the Palliative Medicine Team to assist in the care of this patient.  Asencion Gowda, NP  Please contact Palliative Medicine Team phone at [phone] for questions and concerns.

## 2023-02-07 NOTE — Progress Notes (Signed)
Speech Language Pathology Treatment: Dysphagia  Patient Details Name: John [name] MRN: [id number] DOB: 21-Jan-1946 Today's Date: 02/07/2023 Time: [id number] SLP Time Calculation (min) (ACUTE ONLY): 40 min  Assessment / Plan / Recommendation Clinical Impression  Pt seen for ongoing assessment of swallowing. He was alert, verbally responsive and engaged w/ this SLP intermittently; low volume. Min slow motor and overall responses but appropriate given time, gentle cues. Followed commands w/ cues. Pt is on [place] O2 support 3L; wbc slightly elevated. Afebrile. Missing Dentition.  Pt much more alert today w/ sedating medications continuing to wear off, per NSG report. NSG also reported pt had min po's last night(reported to her) w/ no adverse effects.  Pt explained general aspiration precautions and agreed verbally to the need for following them especially sitting upright for all oral intake -- supported behind the back more for full upright sitting. Pt assisted w/ positioning d/t weakness. He was able to help hold cup to feed himself and consumed trials of ice chips, thin liquids via cup/straw, and purees. No overt, clinical s/s of aspiration were noted w/ any consistency; respiratory status remained calm and unlabored, vocal quality clear b/t trials, no coughing/throat clearing, and O2 sats remained at his Baseline of ~90%. Pt was also given Pills Whole in Puree and w/ sips of water(thin) w/ NSG present which he tolerated well; the puree providing some cohesion for swallowing tablets although a Pills was left behind 1x in the mouth and he used a sip of water to clear it. Same was noted by NSG previously per report. Oral phase appeared grossly [facility] for bolus management and A-P transfer for swallowing as well as oral clearing; min increased oral phase time noted intermittently w/ po trials, so increased textured trials were not assessed this session.    Pt appears at reduced risk for aspriation/aspiration  pneumonia when following general aspiration precautions w/ a modified diet consistency; feeding support at meals.  Recommend a Dysphagia level 1 diet (puree foods) w/ gravies added to moisten foods; Thin liquids. Recommend general aspiration precautions; Pills Whole in Puree; tray setup and positioning assistance for meals. Feeding Support/Supervision as needed d/t overall weakness.   ST services will continue to follow for ongoing toleration of diet and trials to upgrade as appropriate. Recommend oral care. Dietician f/u for support. NSG updated. Precautions posted at bedside.        HPI HPI: Per H&P, pt is 77 year old male with a history of essential hypertension, on hydralazine and Cozaar, stage IV CKD but has not been on dialysis in the past, has chronic diastolic and systolic heart failure with cor pulmonale, moderate tricuspid regurgitation and aortic valve regurgitation, history of a ascending aortic aneurysm, coronary artery disease with atherosclerosis, COPD and chronic hypoxemia baseline 2 L/min supplemental oxygen at home with recurrent episodes of hypoxemia and remote acute exacerbation of COPD with hypoxemia and hypercapnia 2 months ago history of ruptured left quadricep tendon 2 years ago, chronic thrombocytopenia and erythrocytosis recurrent lower extremity cellulitis, recurrent hyperkalemia, morbid obesity history of anasarca dyslipidemia, recent admission for acute CHF exacerbation requiring Lasix drip and BiPAP support, who was brought in by EMS due to worsening altered mental status and circulatory shock.  I was able to meet with wife at bedside during my evaluation who shares patient has been sleeping majority of each day over the last week has been on arousable and received phone call from medical provider regarding worsening kidney function which prompted ER evaluation.  He had chest x-ray performed while  in the ER showing bilateral pleural effusions with vascular congestion and  atelectasis/infiltrate.  Patient in circulatory shock with Levophed support on admission in ER.  PCCM consultation for admission to medical intensive care unit with circulatory shock consistent with acute on chronic renal failure with CHF exacerbation. Pt inubated on 2/14 and extubated on 2/21.      SLP Plan  Continue with current plan of care      Recommendations for follow up therapy are one component of a multi-disciplinary discharge planning process, led by the attending physician.  Recommendations may be updated based on patient status, additional functional criteria and insurance authorization.    Recommendations  Diet recommendations: Dysphagia 1 (puree);Thin liquid Liquids provided via: Cup;Straw (monitor) Medication Administration: Crushed with puree (as needed per NSG) Supervision: Staff to assist with self feeding;Full supervision/cueing for compensatory strategies Compensations: Minimize environmental distractions;Slow rate;Small sips/bites;Lingual sweep for clearance of pocketing;Follow solids with liquid Postural Changes and/or Swallow Maneuvers: Out of bed for meals;Seated upright 90 degrees;Upright 30-60 min after meal                General recommendations:  (Dietician f/u) Oral Care Recommendations: Oral care BID;Oral care before and after PO;Staff/trained caregiver to provide oral care Follow Up Recommendations: Skilled nursing-short term rehab (<3 hours/day) (TBD) Assistance recommended at discharge: Frequent or constant Supervision/Assistance SLP Visit Diagnosis: Dysphagia, oral phase (R13.11) (min) Plan: Continue with current plan of care              Orinda Kenner, [place], [place]; [place] [phone] (ascom) Antwoine Zorn  02/07/2023, 12:23 PM

## 2023-02-07 NOTE — Progress Notes (Signed)
[place] NOTE       Patient ID: Geordie Hake MRN: [id number] DOB/AGE: 24-Mar-1946 77 y.o.  Admit date: 01/29/2023 Referring Physician Darel Hong, NP  Primary Physician Dr. Ginette Pitman Primary Cardiologist Dr. Nehemiah Massed  Reason for Consultation elevated troponin / AoCHF  HPI: Nayson Spaulding is a 82yoM with a PMH of HFpEF (50-55%, G1 DD with a D-shaped LV c/w RV overload, mi-mod TR 10/02/2022), COPD (prn 2 L), CKD 4, chronic thrombocytopenia, HTN, hx tobacco use who presented to [facility] ED 01/28/2023 at the request of his nephrologist after outpatient labs showed acutely worsening renal function.  He was brought in by EMS with altered mental status and in circulatory shock requiring vasopressors, CRRT for acute renal failure, and respiratory failure needing mechanical intubation. Troponins checked on admission were elevated and trended 2351, 2252, and 1824. BNP 2500. Echo this admission redemonstrates preserved LVEF 50-55%, RV failure with severely elevated RVSP (62.21mHg), mod RA dilation, mi-mod TR and MR. Cardiology is consulted on hospital day 5 for assistance with his heart failure.   Interval History:  - seen and examined this AM, continues to refuse further dialysis - no chest pain or shortness of breath - says he "wants to take it slow" - palliative following   Past Medical History:  Diagnosis Date   CHF (congestive heart failure) (HCC)    EF 45% in 2021   Chronic kidney disease    COPD (chronic obstructive pulmonary disease) ([place] 02/16/2020   Hyperlipidemia    Hypertension    Pneumonia 2021    Past Surgical History:  Procedure Laterality Date   CATARACT EXTRACTION W/PHACO Left 10/31/2021   Procedure: CATARACT EXTRACTION PHACO AND INTRAOCULAR LENS PLACEMENT ([place] LEFT VISION BLUE 3.45 00:48.0;  Surgeon: BLeandrew Koyanagi MD;  Location: [facility]  Service: Ophthalmology;  Laterality: Left;   CATARACT EXTRACTION W/PHACO Right 11/14/2021    Procedure: CATARACT EXTRACTION PHACO AND INTRAOCULAR LENS PLACEMENT (I[facility]) RIGHT 8.59 01:10.5;  Surgeon: BLeandrew Koyanagi MD;  Location: [facility]  Service: Ophthalmology;  Laterality: Right;   INGUINAL HERNIA REPAIR Right 02/04/2017   Procedure: HERNIA REPAIR INGUINAL ADULT;  Surgeon: JLeonie Green MD;  Location: [facility] OR[facility];  Service: General;  Laterality: Right;   NO PAST SURGERIES     QUADRICEPS TENDON REPAIR Left 02/15/2020   Procedure: REPAIR QUADRICEP TENDON;  Surgeon: PCorky Mull MD;  Location: [facility] ORS;  Service: Orthopedics;  Laterality: Left;    Medications Prior to Admission  Medication Sig Dispense Refill Last Dose   aspirin EC 81 MG tablet Take 1 tablet (81 mg total) by mouth daily. Swallow whole. 90 tablet 3 01/28/2023   calcitRIOL (ROCALTROL) 0.25 MCG capsule Take 0.25 mcg by mouth daily.   01/28/2023   cephALEXin (KEFLEX) 500 MG capsule Take 500 mg by mouth 3 (three) times daily.   01/28/2023   gabapentin (NEURONTIN) 100 MG capsule Take 1 capsule (100 mg total) by mouth 2 (two) times daily.   01/28/2023   ipratropium-albuterol (DUONEB) 0.5-2.5 (3) MG/3ML SOLN Inhale 3 mLs into the lungs every 6 (six) hours as needed (wheezing).   unknown   losartan (COZAAR) 50 MG tablet Take 50 mg by mouth daily.   01/28/2023   metolazone (ZAROXOLYN) 2.5 MG tablet Take 1 tablet (2.5 mg total) by mouth 2 (two) times a week.   Past Week   midodrine (PROAMATINE) 2.5 MG tablet Take 2.5 mg by mouth 3 (three) times daily.   01/28/2023   omeprazole (PRILOSEC) 20 MG capsule  Take 20 mg by mouth daily.   01/28/2023   potassium chloride SA (KLOR-CON M) 20 MEQ tablet Take 1 tablet (20 mEq total) by mouth daily. With extra 27mq on M/ F with metolazone 100 tablet 3 Past Week   torsemide 60 MG TABS Take 60 mg by mouth daily. 30 tablet 1 01/28/2023   Social History   Socioeconomic History   Marital status: Single    Spouse name: Not on file   Number of children: Not on file   Years of  education: Not on file   Highest education level: Not on file  Occupational History   Not on file  Tobacco Use   Smoking status: Former    Packs/day: 0.25    Years: 53.00    Total pack years: 13.25    Types: Cigarettes    Quit date: 02/15/2015    Years since quitting: 7.9   Smokeless tobacco: Never  Substance and Sexual Activity   Alcohol use: Yes    Comment: occassional   Drug use: No   Sexual activity: Not on file  Other Topics Concern   Not on file  Social History Narrative   Not on file   Social Determinants of Health   Financial Resource Strain: Low Risk  (07/17/2022)   Overall Financial Resource Strain (CARDIA)    Difficulty of Paying Living Expenses: Not hard at all  Food Insecurity: No Food Insecurity (01/29/2023)   Hunger Vital Sign    Worried About Running Out of Food in the Last Year: Never true    Ran Out of Food in the Last Year: Never true  Transportation Needs: No Transportation Needs (01/29/2023)   PRAPARE - THydrologist(Medical): No    Lack of Transportation (Non-Medical): No  Physical Activity: Sufficiently Active (07/17/2022)   Exercise Vital Sign    Days of Exercise per Week: 5 days    Minutes of Exercise per Session: 60 min  Stress: No Stress Concern Present (07/17/2022)   FPinon   Feeling of Stress : Not at all  Social Connections: [place](07/17/2022)   Social Connection and Isolation Panel [NHANES]    Frequency of Communication with Friends and Family: More than three times a week    Frequency of Social Gatherings with Friends and Family: More than three times a week    Attends Religious Services: 1 to 4 times per year    Active Member of [employer] Organizations: Yes    Attends CArchivistMeetings: More than 4 times per year    Marital Status: Living with partner  Intimate Partner Violence: Not At Risk (01/29/2023)   Humiliation,  Afraid, Rape, and Kick questionnaire    Fear of Current or Ex-Partner: No    Emotionally Abused: No    Physically Abused: No    Sexually Abused: No    Family History  Problem Relation Age of Onset   Diabetes Mother    Diabetes Brother    Diabetes Maternal Grandmother    Diabetes Paternal Grandmother       Intake/Output Summary (Last 24 hours) at 02/07/2023 0845 Last data filed at 02/07/2023 0600 Gross per 24 hour  Intake 500 ml  Output 1701 ml  Net -1201 ml     Vitals:   02/07/23 0500 02/07/23 0600 02/07/23 0700 02/07/23 0800  BP: 115/87 135/83 139/83   Pulse: 91 94 (!) 102   Resp: 19 (!)  23 (!) 30   Temp:    99 F (37.2 C)  TempSrc:    Oral  SpO2: 94% 95% 90%   Weight: 64.5 kg     Height:        PHYSICAL EXAM General: Acute on chronically ill-appearing black male, sitting upright in ICU bed with significant other at bedside.  HEENT:  Normocephalic and atraumatic. Poor dentition Neck:  + JVD.  Lungs: normal respiratory effort on o2 by Home. Decreased breath sounds and crackles bilaterally.  Heart: regular rate and rhythm. Normal S1 and S2 without gallops or murmurs.  Abdomen: distended appearing Msk: generalized weakness Extremities: No clubbing, cyanosis. Trace bilateral peripheral edema, chronic hyperpigmentation + skin wrinkling/dimpling. Tenderness to palpation of LLE. Kerlix dressing to LLE Neuro: fatigued Psych: somnolent   Labs: Basic Metabolic Panel: Recent Labs    02/06/23 0520 02/07/23 0629  NA 141 142  K 3.8 4.0  CL 107 108  CO2 28 26  GLUCOSE 112* 143*  BUN 89* 93*  CREATININE 2.81* 2.87*  CALCIUM 9.3 9.2  MG 2.4 2.6*  PHOS 3.4 3.6    Liver Function Tests: Recent Labs    02/06/23 0520 02/07/23 0629  ALBUMIN 2.6* 2.9*    No results for input(s): "LIPASE", "AMYLASE" in the last 72 hours. CBC: Recent Labs    02/06/23 0520 02/07/23 0629  WBC 10.2 12.7*  NEUTROABS 8.0* 10.2*  HGB 12.6* 13.2  HCT 40.4 41.4  MCV 103.6* 102.7*  PLT  73* 83*    Cardiac Enzymes: No results for input(s): "CKTOTAL", "CKMB", "CKMBINDEX", "TROPONINIHS" in the last 72 hours. BNP: No results for input(s): "BNP" in the last 72 hours.  D-Dimer: No results for input(s): "DDIMER" in the last 72 hours. Hemoglobin A1C: No results for input(s): "HGBA1C" in the last 72 hours. Fasting Lipid Panel: No results for input(s): "CHOL", "HDL", "LDLCALC", "TRIG", "CHOLHDL", "LDLDIRECT" in the last 72 hours. Thyroid Function Tests: No results for input(s): "TSH", "T4TOTAL", "T3FREE", "THYROIDAB" in the last 72 hours.  Invalid input(s): "FREET3" Anemia Panel: No results for input(s): "VITAMINB12", "FOLATE", "FERRITIN", "TIBC", "IRON", "RETICCTPCT" in the last 72 hours.   Radiology: [facility] Chest Port 1 View  Result Date: 02/04/2023 CLINICAL DATA:  Acute respiratory failure with hypoxia. EXAM: PORTABLE CHEST 1 VIEW COMPARISON:  Chest radiograph February 01, 2023. FINDINGS: Endotracheal tube with tip in the midthoracic trachea 4.8 cm from the carina. Esophageal temperature probe in place. Nasogastric tube courses below the diaphragm with tip obscured by collimation. Additional cardiac monitoring leads project over the chest. Normal size heart. Aortic atherosclerosis. Apparent widening of the mediastinal vascular pedicle is favored secondary to rotation. Layering small bilateral pleural effusions with persistent right-greater-than-left airspace opacitie. No acute osseous abnormality IMPRESSION: 1. Stable layering small bilateral pleural effusions with persistent right-greater-than-left airspace opacities. Electronically Signed   By: Dahlia Bailiff M.D.   On: 02/04/2023 08:24   DG Abd 1 View  Result Date: 02/03/2023 CLINICAL DATA:  Vomiting EXAM: ABDOMEN - 1 VIEW COMPARISON:  01/30/2023 FINDINGS: Nasogastric tube noted with tip and side port in the stomach body. Gas fills the stomach. Mild scarring or atelectasis at the left lung base. A catheter projects over the left  groin and extends over into the right abdomen, presumably a venous catheter. There is also a small amount of tubing projecting over the right hip. There is evidence of lumbar spondylosis and degenerative disc disease. Unremarkable bowel gas pattern. IMPRESSION: 1. Nasogastric tube tip and side port are in the stomach body.  2. Mild scarring or atelectasis at the left lung base. 3. Unremarkable bowel gas pattern. Electronically Signed   By: Van Clines M.D.   On: 02/03/2023 15:26   CT HEAD WO CONTRAST (5MM)  Result Date: 02/03/2023 CLINICAL DATA:  Altered mental status EXAM: CT HEAD WITHOUT CONTRAST TECHNIQUE: Contiguous axial images were obtained from the base of the skull through the vertex without intravenous contrast. RADIATION DOSE REDUCTION: This exam was performed according to the departmental dose-optimization program which includes automated exposure control, adjustment of the mA and/or kV according to patient size and/or use of iterative reconstruction technique. COMPARISON:  None Available. FINDINGS: Brain: No evidence of acute infarction, hemorrhage, mass, mass effect, or midline shift. No hydrocephalus or extra-axial fluid collection. Partial empty sella. Normal craniocervical junction. Cerebral volume is within normal limits for age. Vascular: No hyperdense vessel. Skull: Negative for fracture or focal lesion. Sinuses/Orbits: Mucosal thickening in the left greater than right maxillary sinus. Other: Underpneumatized right mastoid, with trace fluid. The left mastoid air cells are well aerated. Endotracheal and orogastric tubes noted. IMPRESSION: No acute intracranial process. Electronically Signed   By: Merilyn Baba M.D.   On: 02/03/2023 03:03   DG Chest Port 1 View  Result Date: 02/01/2023 CLINICAL DATA:  Dyspnea EXAM: PORTABLE CHEST 1 VIEW COMPARISON:  01/29/2023 FINDINGS: There is pulmonary vascular congestion. Bibasilar consolidation or volume loss. No pneumothorax. Possible small  pleural effusions. Endotracheal tube tip 3 cm above carina. NG tube tip below the diaphragm and off x-ray. Mediastinal drain or probe overlies the mid thorax. IMPRESSION: Vascular congestion.  Bibasilar consolidation or volume loss. Electronically Signed   By: Sammie Bench M.D.   On: 02/01/2023 13:56   CT Angio Chest Pulmonary Embolism (PE) W or WO Contrast  Result Date: 01/30/2023 CLINICAL DATA:  Chronic hypoxemia and hypercapnia related to COPD, congestive heart failure and chronic renal failure. EXAM: CT ANGIOGRAPHY CHEST WITH CONTRAST TECHNIQUE: Multidetector CT imaging of the chest was performed using the standard protocol during bolus administration of intravenous contrast. Multiplanar CT image reconstructions and MIPs were obtained to evaluate the vascular anatomy. RADIATION DOSE REDUCTION: This exam was performed according to the departmental dose-optimization program which includes automated exposure control, adjustment of the mA and/or kV according to patient size and/or use of iterative reconstruction technique. CONTRAST:  9m OMNIPAQUE IOHEXOL 350 MG/ML SOLN COMPARISON:  Portable chest obtained yesterday. Chest, abdomen and pelvis CT dated 01/18/2022. FINDINGS: Cardiovascular: Enlarged heart. Enlarged ascending thoracic aorta measuring 5.2 cm in diameter at the level of the main pulmonary artery. Enlarged central pulmonary arteries with a main pulmonary artery measuring 3.6 cm in diameter. Maximum aortic arch diameter of 3.2 cm. Descending thoracic aortic diameter of 3.1 cm. Normally opacified pulmonary arteries with no pulmonary arterial filling defects seen. Atheromatous calcifications, including the coronary arteries and aorta. Mediastinum/Nodes: Endotracheal tube in satisfactory position. Enteric tube extending through the esophagus with its tip in the proximal to mid stomach. No enlarged lymph nodes. Unremarkable thyroid gland. Lungs/Pleura: Small to moderate-sized right pleural effusion  and small left pleural effusion. Diffuse bilateral centrilobular and some paraseptal bullous changes, most pronounced in the upper lobes. Mild bilateral lower lobe dependent atelectasis. No airspace consolidation suspicious for pneumonia. No lung nodules. Upper Abdomen: The liver has mildly nodular contours with somewhat prominent lateral segment left lobe and caudate lobe and somewhat small right lobe. Two small probable cysts in the right lobe of the liver. Normal sized spleen. Small to moderate amount of ascites in the upper  abdomen. Musculoskeletal: Minimal thoracic spine degenerative changes. Review of the MIP images confirms the above findings. IMPRESSION: 1. No pulmonary emboli. 2. Ascending thoracic aortic aneurysm measuring 5.2 cm in maximum diameter. Recommend semi-annual imaging followup by CTA or MRA and referral to cardiothoracic surgery if not already obtained. This recommendation follows 2010 ACCF/AHA/AATS/ACR/ASA/SCA/SCAI/SIR/STS/SVM Guidelines for the Diagnosis and Management of Patients With Thoracic Aortic Disease. Circulation. 2010; [id number]. Aortic aneurysm NOS (ICD10-I71.9) 3. Enlarged central pulmonary arteries compatible with pulmonary arterial hypertension. 4. Small to moderate-sized right pleural effusion and small left pleural effusion. 5. Changes of COPD and centrilobular and paraseptal emphysema. 6. Findings suggesting cirrhosis of the liver with an associated small to moderate amount of ascites in the upper abdomen. 7.  Calcific coronary artery and aortic atherosclerosis. Aortic Atherosclerosis (ICD10-I70.0) and Emphysema (ICD10-J43.9). Electronically Signed   By: Claudie Revering M.D.   On: 01/30/2023 18:48   ECHOCARDIOGRAM COMPLETE  Result Date: 01/30/2023    ECHOCARDIOGRAM REPORT   Patient Name:   Kwamaine Parker Date of Exam: 01/30/2023 Medical Rec #:  [id number]      Height:       71.0 in Accession #:    [id number]     Weight:       223.3 lb Date of Birth:  19-May-1946     BSA:           2.210 m Patient Age:    5 years       BP:           163/69 mmHg Patient Gender: M              HR:           58 bpm. Exam Location:  [facility] Procedure: 2D Echo, Cardiac Doppler and Color Doppler Indications:     Elevated Troponin  History:         Patient has prior history of Echocardiogram examinations, most                  recent 10/02/2022. CHF, COPD; Risk Factors:Hypertension.                  Cardiogenic shock, ESRD.  Sonographer:     Wenda Low Referring Phys:  [id number] BRITTON L [place] Diagnosing Phys: Ida Rogue MD  Sonographer Comments: Suboptimal apical window and echo performed with patient supine and on artificial respirator. IMPRESSIONS  1. Left ventricular ejection fraction, by estimation, is 50 to 55%. The left ventricle has low normal function. The left ventricle has no regional wall motion abnormalities. There is mild left ventricular hypertrophy. Left ventricular diastolic parameters are consistent with Grade I diastolic dysfunction (impaired relaxation). There is the interventricular septum is flattened in systole and diastole, consistent with right ventricular pressure and volume overload.  2. Right ventricular systolic function is severely reduced. The right ventricular size is moderately enlarged. There is severely elevated pulmonary artery systolic pressure. The estimated right ventricular systolic pressure is [id number] mmHg.  3. Right atrial size was moderately dilated.  4. The mitral valve is normal in structure. Mild mitral valve regurgitation. No evidence of mitral stenosis. There is mild late systolic prolapse of both leaflets of the mitral valve.  5. Tricuspid valve regurgitation is mild to moderate.  6. The aortic valve is tricuspid. Aortic valve regurgitation is mild to moderate. No aortic stenosis is present.  7. The inferior vena cava is dilated in size with <50% respiratory variability, suggesting right atrial pressure of 15 mmHg.  8. There  is mild dilatation of the  aortic root, measuring 44 mm. There is mild dilatation of the ascending aorta, measuring 43 mm. FINDINGS  Left Ventricle: Left ventricular ejection fraction, by estimation, is 50 to 55%. The left ventricle has low normal function. The left ventricle has no regional wall motion abnormalities. The left ventricular internal cavity size was normal in size. There is mild left ventricular hypertrophy. The interventricular septum is flattened in systole and diastole, consistent with right ventricular pressure and volume overload. Left ventricular diastolic parameters are consistent with Grade I diastolic dysfunction (impaired relaxation). Right Ventricle: The right ventricular size is moderately enlarged. No increase in right ventricular wall thickness. Right ventricular systolic function is severely reduced. There is severely elevated pulmonary artery systolic pressure. The tricuspid regurgitant velocity is 3.45 m/s, and with an assumed right atrial pressure of 15 mmHg, the estimated right ventricular systolic pressure is [id number] mmHg. Left Atrium: Left atrial size was normal in size. Right Atrium: Right atrial size was moderately dilated. Pericardium: There is no evidence of pericardial effusion. Mitral Valve: The mitral valve is normal in structure. There is mild late systolic prolapse of both leaflets of the mitral valve. Mild mitral valve regurgitation. No evidence of mitral valve stenosis. MV peak gradient, 3.9 mmHg. The mean mitral valve gradient is 1.0 mmHg. Tricuspid Valve: The tricuspid valve is normal in structure. Tricuspid valve regurgitation is mild to moderate. No evidence of tricuspid stenosis. Aortic Valve: The aortic valve is tricuspid. Aortic valve regurgitation is mild to moderate. No aortic stenosis is present. Aortic valve mean gradient measures 1.0 mmHg. Aortic valve peak gradient measures 3.9 mmHg. Aortic valve area, by VTI measures 3.79 cm. Pulmonic Valve: The pulmonic valve was normal in structure.  Pulmonic valve regurgitation is mild to moderate. No evidence of pulmonic stenosis. Aorta: The aortic root is normal in size and structure. There is mild dilatation of the aortic root, measuring 44 mm. There is mild dilatation of the ascending aorta, measuring 43 mm. Venous: The inferior vena cava is dilated in size with less than 50% respiratory variability, suggesting right atrial pressure of 15 mmHg. IAS/Shunts: No atrial level shunt detected by color flow Doppler.  LEFT VENTRICLE PLAX 2D LVIDd:         4.40 cm   Diastology LVIDs:         3.00 cm   LV e' medial:    6.85 cm/s LV PW:         1.30 cm   LV E/e' medial:  6.3 LV IVS:        1.30 cm   LV e' lateral:   5.98 cm/s LVOT diam:     2.10 cm   LV E/e' lateral: 7.2 LV SV:         68 LV SV Index:   31 LVOT Area:     3.46 cm  RIGHT VENTRICLE RV Basal diam:  5.10 cm RV Mid diam:    5.90 cm RV S prime:     14.50 cm/s TAPSE (M-mode): 1.8 cm LEFT ATRIUM             Index        RIGHT ATRIUM           Index LA diam:        2.90 cm 1.31 cm/m   RA Area:     27.50 cm LA Vol (A2C):   59.3 ml 26.84 ml/m  RA Volume:   101.00 ml 45.71 ml/m LA Vol (A4C):  59.1 ml 26.74 ml/m LA Biplane Vol: 59.4 ml 26.88 ml/m  AORTIC VALVE                    PULMONIC VALVE AV Area (Vmax):    3.22 cm     PV Vmax:       1.00 m/s AV Area (Vmean):   4.15 cm     PV Peak grad:  4.0 mmHg AV Area (VTI):     3.79 cm AV Vmax:           98.70 cm/s AV Vmean:          45.500 cm/s AV VTI:            0.180 m AV Peak Grad:      3.9 mmHg AV Mean Grad:      1.0 mmHg LVOT Vmax:         91.80 cm/s LVOT Vmean:        54.500 cm/s LVOT VTI:          0.197 m LVOT/AV VTI ratio: 1.09  AORTA Ao Root diam: 4.40 cm Ao Asc diam:  4.30 cm MITRAL VALVE               TRICUSPID VALVE MV Area (PHT): 2.10 cm    TR Peak grad:   47.6 mmHg MV Area VTI:   2.38 cm    TR Vmax:        345.00 cm/s MV Peak grad:  3.9 mmHg MV Mean grad:  1.0 mmHg    SHUNTS MV Vmax:       0.99 m/s    Systemic VTI:  0.20 m MV Vmean:      42.3  cm/s   Systemic Diam: 2.10 cm MV Decel Time: 361 msec MV E velocity: 43.00 cm/s MV A velocity: 84.90 cm/s MV E/A ratio:  0.51 Ida Rogue MD Electronically signed by Ida Rogue MD Signature Date/Time: 01/30/2023/4:10:59 PM    Final    DG Abd Portable 1V  Result Date: 01/30/2023 CLINICAL DATA:  NGT placement EXAM: PORTABLE ABDOMEN - 1 VIEW COMPARISON:  01/29/2023 FINDINGS: Image of the upper abdomen demonstrates a NG tube superimposed with the stomach below the diaphragm. IMPRESSION: NG tube appropriately positioned. Electronically Signed   By: Sammie Bench M.D.   On: 01/30/2023 13:12   DG Chest Port 1 View  Result Date: 01/29/2023 CLINICAL DATA:  Intubation EXAM: PORTABLE CHEST 1 VIEW COMPARISON:  01/29/2023 8:30 a.m. FINDINGS: Interval placement of endotracheal tube, tip over the midtrachea, and esophagogastric tube, tip not visualized although below the diaphragm. Cardiomegaly. Small bilateral pleural effusions. Osseous structures unremarkable. IMPRESSION: 1. Interval placement of endotracheal tube, tip over the midtrachea, and esophagogastric tube, tip not visualized although below the diaphragm. 2. Cardiomegaly. 3. Small bilateral pleural effusions. Electronically Signed   By: Delanna Ahmadi M.D.   On: 01/29/2023 17:03   DG Abd 1 View  Result Date: 01/29/2023 CLINICAL DATA:  Orogastric tube placement. EXAM: ABDOMEN - 1 VIEW COMPARISON:  None Available. FINDINGS: Distal tip of nasogastric tube is seen in expected position of gastroesophageal junction. No abnormal bowel dilatation is noted. IMPRESSION: Distal tip of nasogastric tube is seen in expected position of gastroesophageal junction. Advancement is recommended. Electronically Signed   By: Marijo Conception M.D.   On: 01/29/2023 17:02   DG Chest Portable 1 View  Result Date: 01/29/2023 CLINICAL DATA:  Shortness of breath. EXAM: PORTABLE CHEST 1 VIEW COMPARISON:  01/21/2022 FINDINGS: Leftward patient  rotation. The cardio pericardial  silhouette is enlarged. Vascular congestion noted with bibasilar atelectasis/infiltrate and probable layering small bilateral effusions. The visualized bony structures of the thorax are unremarkable. Telemetry leads overlie the chest. IMPRESSION: Vascular congestion with bibasilar atelectasis/infiltrate and probable layering small bilateral effusions. Electronically Signed   By: Misty Stanley M.D.   On: 01/29/2023 08:38     TELEMETRY reviewed by me (LT) 02/07/2023 : sinus tachycardia rate low 100s with occasional PVCs  EKG reviewed by me: NSR nonspecific TW changes  Data reviewed by me (LT) 02/07/2023: nephrology note critical care progress note, last 24 hours vitals, imaging, labs, telemetry  Principal Problem:   Shock circulatory ([place]) Active Problems:   Acute on chronic congestive heart failure ([place])   Acute hypoxic respiratory failure ([facility])   Toxic metabolic encephalopathy    ASSESSMENT AND PLAN:  Royden Coln is a 69yoM with a PMH of HFpEF (50-55%, G1 DD with a D-shaped LV c/w RV overload, mi-mod TR 10/02/2022), COPD (prn 2 L), CKD 4, chronic thrombocytopenia, HTN, hx tobacco use who presented to [facility] ED 01/28/2023 at the request of his nephrologist after outpatient labs showed acutely worsening renal function.  He was brought in by EMS with altered mental status and in circulatory shock requiring vasopressors, CRRT for acute renal failure, and respiratory failure needing mechanical intubation. Troponins checked on admission were elevated and trended 2351, 2252, and 1824. BNP 2500. Echo this admission redemonstrates preserved LVEF 50-55%, RV failure with severely elevated RVSP (62.95mHg), mod RA dilation, mi-mod TR and MR. Cardiology is consulted on hospital day 5 for assistance with his heart failure.    # acute hypoxic respiratory failure # toxic metabolic encephalopathy # shock In the setting of acute renal failure and AoC HFpEF as below with underlying COPD/pulm HTN.  -extubated 2/21  to   # acute renal failure on CKD IV On torsemide '60mg'$  daily + metolazone 2.'5mg'$  twice weekly, and losartan '50mg'$  daily. Presented with decreased UOP and doubling of Cr per outpatient labs. Now oliguric.  - s/p CRRT, now declining further HD  - diuretic mgmt per nephrology  - agree with palliative care consult   # acute on chronic HFpEF # RV failure, pulm HTN BNP elevated at 2500 on admission, pleural effusions + severe RV dysfunction & cirrhotic changes on imaging. Unclear of trigger for this decompensation, query progressively worsening renal function in the setting of aggressive outpatient diuretics.  Dry weight reportedly ~198lbs. Still clinically hypervolemic appearing, although improving. -GDMT limited by hypotension + renal insufficiency. On midodrine at baseline.   - continue to monitor BMP, further cardiac diagnostics limited by renal function.  - GOC as above as pt is currently declining further HD  # demand ischemia Troponins elevated and trending 2351, 2252, and 1824 on admission. Echo with severe RV dysfunction w/o rWMAs. No known hx of CAD.  Suspect demand in setting of acute renal failure and volume overload with possible underlying CAD.  - will defer further ischemic / hemodynamic eval (R/LHC) at this time. Tenuous renal function, respiratory status, and coagulapathy preclude further invasive diagnostics.   # thrombocytopenia  Chronic in nature, drift from 91K -- low 32K plts, currently 83K without active bleeding.    Cardiology will sign off. Please haiku with questions or re-engage if needed.    This patient's plan of care was discussed and created with Dr. PSaralyn Pilarand he is in agreement.  Signed: LTristan Schroeder, PA-C 02/07/2023, 8:45 AM [facility]

## 2023-02-07 NOTE — Progress Notes (Signed)
Nutrition Follow Up Note   DOCUMENTATION CODES:   Not applicable  INTERVENTION:   Ensure Enlive po TID, each supplement provides 350 kcal and 20 grams of protein.  MVI po daily   Vitamin C '500mg'$  po BID   Assistance with meals.   NUTRITION DIAGNOSIS:   Inadequate oral intake related to inability to eat (pt sedated and ventilated) as evidenced by NPO status. -resolving   GOAL:   Patient will meet greater than or equal to 90% of their needs -progressing   MONITOR:   PO intake, Supplement acceptance, Labs, Weight trends, I & O's, Skin  ASSESSMENT:   77 y/o male with h/o COPD, CHF, HTN, CKD IV, aortic aneurysm and HLD who is admitted with circulatory shock, CHF, encephalopaty and new ESRD. Pt with cirrrhosis noted on CT scan.  Met with pt in room today. Pt seen by SLP today and initiated on a pureed diet. Pt reports that he is not feeling hungry today as he just ate breakfast. Pt documented to have eaten 75% of his breakfast with assistance. Pt reports that he is willing to drink strawberry Ensure in hospital. RD will add supplements and vitamins to help pt meet his estimated needs and to support wound healing.   Medications reviewed and include: aspirin, colace, folic acid, heparin, thiamine  Labs reviewed: K 4.0 wnl, BUN 93(H), creat 2.87(H), P 3.6 wnl, Mg 2.6(H) BNP- 1706.8(H)- 2/19 WBC- 12.7(H) Cbgs- 150, 133, 114 x 24 hrs   Diet Order:   Diet Order             DIET - DYS 1 Room service appropriate? Yes with Assist; Fluid consistency: Thin  Diet effective now                  EDUCATION NEEDS:   No education needs have been identified at this time  Skin:  Skin Assessment: Reviewed RN Assessment (Partial thickness wounds LE and right upper back; LE related to edema)  Last BM:  2/22- TYPE 5  Height:   Ht Readings from Last 1 Encounters:  01/29/23 '5\' 11"'$  (1.803 m)    Weight:   Wt Readings from Last 1 Encounters:  02/07/23 64.5 kg    Ideal Body  Weight:  78 kg  BMI:  Body mass index is 19.83 kg/m.  Estimated Nutritional Needs:   Kcal:  1800-2100kcal/day  Protein:  90-105g/day  Fluid:  2.0L/day  Koleen Distance MS, RD, LDN Please refer to [facility] for RD and/or RD on-call/weekend/after hours pager

## 2023-02-07 NOTE — Progress Notes (Signed)
PROGRESS NOTE    John [name]  [id number] DOB: January 21, 1946 DOA: 01/29/2023 PCP: Alisa Graff, FNP    Brief Narrative:  77 year old male with a history of essential hypertension, on hydralazine and Cozaar, stage IV CKD but has not been on dialysis in the past, has chronic diastolic and systolic heart failure with cor pulmonale, moderate tricuspid regurgitation and aortic valve regurgitation, history of a ascending aortic aneurysm, coronary artery disease with atherosclerosis, COPD and chronic hypoxemia baseline 2 L/min supplemental oxygen at home with recurrent episodes of hypoxemia and remote acute exacerbation of COPD with hypoxemia and hypercapnia 2 months ago history of ruptured left quadricep tendon 2 years ago, chronic thrombocytopenia and erythrocytosis recurrent lower extremity cellulitis, recurrent hyperkalemia, morbid obesity history of anasarca dyslipidemia, recent admission for acute CHF exacerbation requiring Lasix drip and BiPAP support, who was brought in by EMS due to worsening altered mental status and circulatory shock.  01/30/23- I met with girlfriend of patient today to review short term medical plan.  He had Echo whith RV dysfunction and concern for PE.  We have started CRRT yesterday with additional interval improvement. He will have CTPE today.  He is weaned down on pressors today on vasor 0.4 and 4 of levophed reduced from 36 this morning.  01/31/23- remains agitated intermittently overnight. Continued issues with clot burden through CRRT circuit. 02/01/23- remains agitated overnight. Requiring numerous ativan pushes. Attempting to wean down sedation. Failed SBT miserably. 02/02/23- issues with CRRT overnight. Will attempt higher UF today. Assess for possible chest tube placement.  02/03/23- No issues with CRRT overnight, currently running without complication.  CT Head obtained overnight and negative for acute process.  Off Precedex this morning, currently somnolent, mental  status currently precluding extubation. Will perform SBT as mental status permits.  Stop Cefepime, checking ammonia. 02/04/23- Remains on CRRT. Pt more awake and tracking.  Tolerated SBT for 12 hrs  02/05/23- Pt successfully extubated 02/06/23-Off vasopressors.  Palliative Care consulted pt stating he DOES NOT want any form of dialysis.  If pt remains stable will transfer service to [facility] 2/23-care assumed by [facility] hospitalist service.  Assessment & Plan:   Principal Problem:   Shock circulatory ([place]) Active Problems:   Acute on chronic congestive heart failure (HCC)   Acute hypoxic respiratory failure (HCC)   Toxic metabolic encephalopathy  Circulatory shock  Acute decompensated HFpEF and right-sided heart failure n RV dysfunction and dilation on echocardiography, concerning for RV failure and pulmonary hypertension. Porto-pulmonary hypertension on the differential given history of liver cirrhosis. Was on CVVH for ultra-filtration for volume removal, now refusing HD. goal MAP > 65. Plan: Patient is refusing hemodialysis.  Cardiology following.  No further invasive diagnostics  Acute Hypoxic Respiratory Failure Successfully extubated to nasal cannula on 2/22.  Doing well overall.  Remains on nasal cannula at 3 to 4 L. Monitor oxygen and continue to wean as tolerated  Toxic Metabolic Encephalopathy Extubated successfully.  Mental status approaching baseline.  Mental status and in the setting of renal failure, psychotropic medications.  Acute kidney injury on chronic kidney disease stage IV Was on CRRT given hypotension.  Currently refusing any hemodialysis.  Dialysis catheter removed.  Nephrology aware.  Palliative care following as well.  Hepatic cirrhosis Thrombocytopenia No acute issues.  Does confer a poor prognosis.  Was on cefepime empirically given leukocytosis.  Cultures remain negative.  Finished 5 days of antibiotics.  Now discontinued.  DVT prophylaxis: SCD Code Status:  DNR Family Communication: None today Disposition Plan: Status is:  Inpatient Remains inpatient appropriate because: Resolving respiratory failure and encephalopathy.  Patient now refusing dialysis.  Likely appropriate for further de-escalation of care.  Appreciate palliative care follow-up.   Level of care: Med-Surg  Consultants:  Cardiology Palliative care   Procedures:  femoral dialysis catheter, now removed  Antimicrobials:   Subjective: Seen and examined.  Ill-appearing.  Does answer questions appropriately.  Appears in good spirits.  Objective: Vitals:   02/07/23 1000 02/07/23 1100 02/07/23 1200 02/07/23 1300  BP: (!) 155/86 119/83 126/82 133/86  Pulse: (!) 113 (!) 110 (!) 110 (!) 102  Resp: (!) 28 (!) 25 (!) 26 (!) 27  Temp:   99.1 F (37.3 C)   TempSrc:   Oral   SpO2: 91% 90% 92% (!) 87%  Weight:      Height:        Intake/Output Summary (Last 24 hours) at 02/07/2023 1321 Last data filed at 02/07/2023 0959 Gross per 24 hour  Intake 750 ml  Output 1701 ml  Net -951 ml   Filed Weights   02/05/23 1518 02/06/23 0500 02/07/23 0500  Weight: 54.9 kg 65.4 kg 64.5 kg    Examination:  General exam: NAD.  Appears frail and chronically ill Respiratory system: Bibasilar close.  Normal work of breathing.  4 L Cardiovascular system: Tachycardic, regular rhythm, no murmurs Gastrointestinal system: Soft, NT/ND, normal bowel sounds Central nervous system: Alert and oriented. No focal neurological deficits. Extremities: Decreased by about lateral lower extremities Skin: Right mid back wound Psychiatry: Judgement and insight appear normal. Mood & affect appropriate.     Data Reviewed: I have personally reviewed following labs and imaging studies  CBC: Recent Labs  Lab 02/03/23 0956 02/04/23 0400 02/05/23 0411 02/06/23 0520 02/07/23 0629  WBC 14.0* 13.3* 11.9* 10.2 12.7*  NEUTROABS 11.9*  --   --  8.0* 10.2*  HGB 14.6 13.6 13.5 12.6* 13.2  HCT 46.4 43.0 42.7  40.4 41.4  MCV 103.1* 101.7* 103.4* 103.6* 102.7*  PLT 38* 38* 57* 73* 83*   Basic Metabolic Panel: Recent Labs  Lab 02/03/23 0520 02/03/23 1735 02/04/23 0305 02/04/23 0400 02/05/23 0411 02/06/23 0520 02/07/23 0629  NA 136 134* 135  --  137 141 142  K 2.9* 4.0 3.5  --  4.0 3.8 4.0  CL 108 102 103  --  104 107 108  CO2 '24 26 26  '$ --  '28 28 26  '$ GLUCOSE 143* 153* 168*  --  153* 112* 143*  BUN 43* 55* 50*  --  71* 89* 93*  CREATININE 1.28* 1.87* 1.75*  --  2.53* 2.81* 2.87*  CALCIUM 7.8* 9.0 8.8*  --  9.2 9.3 9.2  MG 1.8  --   --  2.3 2.5* 2.4 2.6*  PHOS 1.8* 3.9 2.2*  --  3.5 3.4 3.6   GFR: Estimated Creatinine Clearance: 19.7 mL/min (A) (by C-G formula based on SCr of 2.87 mg/dL (H)). Liver Function Tests: Recent Labs  Lab 02/03/23 0520 02/03/23 1735 02/04/23 0305 02/05/23 0411 02/06/23 0520 02/07/23 0629  AST 17  --   --   --   --   --   ALT 10  --   --   --   --   --   ALKPHOS 50  --   --   --   --   --   BILITOT 1.6*  --   --   --   --   --   PROT 5.0*  --   --   --   --   --  ALBUMIN 2.3*  2.4* 2.8* 2.7* 2.8* 2.6* 2.9*   No results for input(s): "LIPASE", "AMYLASE" in the last 168 hours. Recent Labs  Lab 02/03/23 0956 02/05/23 1021  AMMONIA 45* 22   Coagulation Profile: No results for input(s): "INR", "PROTIME" in the last 168 hours. Cardiac Enzymes: No results for input(s): "CKTOTAL", "CKMB", "CKMBINDEX", "TROPONINI" in the last 168 hours. BNP (last 3 results) No results for input(s): "PROBNP" in the last 8760 hours. HbA1C: No results for input(s): "HGBA1C" in the last 72 hours. CBG: Recent Labs  Lab 02/06/23 1941 02/06/23 2329 02/07/23 0320 02/07/23 0732 02/07/23 1130  GLUCAP 137* 126* 114* 133* 150*   Lipid Profile: No results for input(s): "CHOL", "HDL", "LDLCALC", "TRIG", "CHOLHDL", "LDLDIRECT" in the last 72 hours. Thyroid Function Tests: No results for input(s): "TSH", "T4TOTAL", "FREET4", "T3FREE", "THYROIDAB" in the last 72  hours. Anemia Panel: No results for input(s): "VITAMINB12", "FOLATE", "FERRITIN", "TIBC", "IRON", "RETICCTPCT" in the last 72 hours. Sepsis Labs: No results for input(s): "PROCALCITON", "LATICACIDVEN" in the last 168 hours.  Recent Results (from the past 240 hour(s))  Culture, Respiratory w Gram Stain     Status: None   Collection Time: 01/29/23  3:45 AM   Specimen: Tracheal Aspirate; Respiratory  Result Value Ref Range Status   Specimen Description   Final    TRACHEAL ASPIRATE Performed at [facility], [address], [place], [place] [address]    Special Requests   Final    NONE Performed at [facility], [address] [place], [place] [address]    Gram Stain   Final    MODERATE WBC PRESENT,BOTH PMN AND MONONUCLEAR NO ORGANISMS SEEN    Culture   Final    RARE Normal respiratory flora-no Staph aureus or Pseudomonas seen Performed at [facility] [address], [place], [place] [address]    Report Status 02/01/2023 FINAL  Final  Culture, blood (Routine X 2) w Reflex to ID Panel     Status: None   Collection Time: 01/29/23  1:10 PM   Specimen: BLOOD  Result Value Ref Range Status   Specimen Description BLOOD BLOOD LEFT ARM  Final   Special Requests   Final    BOTTLES DRAWN AEROBIC AND ANAEROBIC Blood Culture adequate volume   Culture   Final    NO GROWTH 5 DAYS Performed at [facility], [address] [place], [place] [address]    Report Status 02/03/2023 FINAL  Final  MRSA Next Gen by PCR, Nasal     Status: None   Collection Time: 01/29/23  1:47 PM   Specimen: Nasal Mucosa; Nasal Swab  Result Value Ref Range Status   MRSA by PCR Next Gen NOT DETECTED NOT DETECTED Final    Comment: (NOTE) The GeneXpert MRSA Assay (FDA approved for NASAL specimens only), is one component of a comprehensive MRSA colonization surveillance program. It is not intended to diagnose MRSA infection nor to guide or monitor treatment for MRSA  infections. Test performance is not FDA approved in patients less than 6 years old. Performed at [facility], [address] [place], [place] [address]   Culture, blood (Routine X 2) w Reflex to ID Panel     Status: None   Collection Time: 01/29/23  2:10 PM   Specimen: BLOOD  Result Value Ref Range Status   Specimen Description BLOOD A-LINE  Final   Special Requests   Final    BOTTLES DRAWN AEROBIC AND ANAEROBIC Blood Culture  adequate volume   Culture   Final    NO GROWTH 5 DAYS Performed at [facility], [address] [place], [place] [address]    Report Status 02/03/2023 FINAL  Final         Radiology Studies: No results found.      Scheduled Meds:  aspirin  81 mg Oral Daily   Chlorhexidine Gluconate Cloth  6 each Topical Daily   docusate sodium  100 mg Oral BID   folic acid  1 mg Oral Daily   heparin injection (subcutaneous)  5,000 Units Subcutaneous Q8H   metoprolol tartrate  12.5 mg Oral BID   multivitamin  1 tablet Oral QHS   [START ON 02/08/2023] thiamine  100 mg Oral Daily   torsemide  60 mg Oral Daily   Continuous Infusions:   LOS: 9 days    Sidney Ace, MD [facility]   If 7PM-7AM, please contact night-coverage  02/07/2023, 1:21 PM

## 2023-02-08 ENCOUNTER — Inpatient Hospital Stay: Payer: Medicare HMO

## 2023-02-08 DIAGNOSIS — R579 Shock, unspecified: Secondary | ICD-10-CM | POA: Diagnosis not present

## 2023-02-08 LAB — RENAL FUNCTION PANEL
Albumin: 2.9 g/dL — ABNORMAL LOW (ref 3.5–5.0)
Anion gap: 9 (ref 5–15)
BUN: 96 mg/dL — ABNORMAL HIGH (ref 8–23)
CO2: 28 mmol/L (ref 22–32)
Calcium: 9.5 mg/dL (ref 8.9–10.3)
Chloride: 106 mmol/L (ref 98–111)
Creatinine, Ser: 3.04 mg/dL — ABNORMAL HIGH (ref 0.61–1.24)
GFR, Estimated: 20 mL/min — ABNORMAL LOW (ref >60)
Glucose, Bld: 172 mg/dL — ABNORMAL HIGH (ref 70–99)
Phosphorus: 4.5 mg/dL (ref 2.5–4.6)
Potassium: 4.1 mmol/L (ref 3.5–5.1)
Sodium: 143 mmol/L (ref 135–145)

## 2023-02-08 LAB — GLUCOSE, CAPILLARY
Glucose-Capillary: 112 mg/dL — ABNORMAL HIGH (ref 70–99)
Glucose-Capillary: 124 mg/dL — ABNORMAL HIGH (ref 70–99)
Glucose-Capillary: 125 mg/dL — ABNORMAL HIGH (ref 70–99)
Glucose-Capillary: 134 mg/dL — ABNORMAL HIGH (ref 70–99)

## 2023-02-08 LAB — MAGNESIUM: Magnesium: 2.7 mg/dL — ABNORMAL HIGH (ref 1.7–2.4)

## 2023-02-08 LAB — HEPATITIS B SURFACE ANTIBODY, QUANTITATIVE: Hep B S AB Quant (Post): <3.1 m[IU]/mL — ABNORMAL LOW (ref >9.9)

## 2023-02-08 MED ORDER — FUROSEMIDE 10 MG/ML IJ SOLN
100.0000 mg | Freq: Once | INTRAVENOUS | Status: AC
  Administered 2023-02-08: 100 mg via INTRAVENOUS
  Filled 2023-02-08: qty 10

## 2023-02-08 NOTE — Progress Notes (Signed)
PROGRESS NOTE    John [name]  [id number] DOB: 08/22/1946 DOA: 01/29/2023 PCP: Alisa Graff, FNP    Brief Narrative:  77 year old male with a history of essential hypertension, on hydralazine and Cozaar, stage IV CKD but has not been on dialysis in the past, has chronic diastolic and systolic heart failure with cor pulmonale, moderate tricuspid regurgitation and aortic valve regurgitation, history of a ascending aortic aneurysm, coronary artery disease with atherosclerosis, COPD and chronic hypoxemia baseline 2 L/min supplemental oxygen at home with recurrent episodes of hypoxemia and remote acute exacerbation of COPD with hypoxemia and hypercapnia 2 months ago history of ruptured left quadricep tendon 2 years ago, chronic thrombocytopenia and erythrocytosis recurrent lower extremity cellulitis, recurrent hyperkalemia, morbid obesity history of anasarca dyslipidemia, recent admission for acute CHF exacerbation requiring Lasix drip and BiPAP support, who was brought in by EMS due to worsening altered mental status and circulatory shock.  01/30/23- I met with girlfriend of patient today to review short term medical plan.  He had Echo whith RV dysfunction and concern for PE.  We have started CRRT yesterday with additional interval improvement. He will have CTPE today.  He is weaned down on pressors today on vasor 0.4 and 4 of levophed reduced from 36 this morning.  01/31/23- remains agitated intermittently overnight. Continued issues with clot burden through CRRT circuit. 02/01/23- remains agitated overnight. Requiring numerous ativan pushes. Attempting to wean down sedation. Failed SBT miserably. 02/02/23- issues with CRRT overnight. Will attempt higher UF today. Assess for possible chest tube placement.  02/03/23- No issues with CRRT overnight, currently running without complication.  CT Head obtained overnight and negative for acute process.  Off Precedex this morning, currently somnolent, mental  status currently precluding extubation. Will perform SBT as mental status permits.  Stop Cefepime, checking ammonia. 02/04/23- Remains on CRRT. Pt more awake and tracking.  Tolerated SBT for 12 hrs  02/05/23- Pt successfully extubated 02/06/23-Off vasopressors.  Palliative Care consulted pt stating he DOES NOT want any form of dialysis.  If pt remains stable will transfer service to [facility] 2/23-care assumed by [facility] hospitalist service. 2/24-after discussion between palliative care, patient, patient's girlfriend the decision was made that the patient would remain DNR status however was interested in restarting hemodialysis.  Nephrology aware.  Kidney function continues to deteriorate.  Assessment & Plan:   Principal Problem:   Shock circulatory ([place]) Active Problems:   Acute on chronic congestive heart failure (HCC)   Acute hypoxic respiratory failure (HCC)   Toxic metabolic encephalopathy  Circulatory shock  Acute decompensated HFpEF and right-sided heart failure n RV dysfunction and dilation on echocardiography, concerning for RV failure and pulmonary hypertension. Porto-pulmonary hypertension on the differential given history of liver cirrhosis. Was on CVVH for ultra-filtration for volume removal, now refusing HD. goal MAP > 65. Plan: Patient agreeable to restarting hemodialysis now.  Cardiology has signed off.  No further diagnostics from their standpoint.  Acute Hypoxic Respiratory Failure Successfully extubated to nasal cannula on 2/22.  Doing well overall.  Remains on nasal cannula at 3 to 4 L. Monitor oxygen and continue to wean as tolerated Lasix challenge 100 mg IV x 1, wean oxygen as tolerated  Toxic Metabolic Encephalopathy Extubated successfully.  Mental status approaching baseline.  Mental status and in the setting of renal failure, psychotropic medications.  Acute kidney injury on chronic kidney disease stage IV Was on CRRT given hypotension.  Now agreeable to restart  hemodialysis.  Kidney function continues to deteriorate.  If dialysis  indicated will contact vascular surgery for PermCath placement Monday 2/26.  Hepatic cirrhosis Thrombocytopenia No acute issues.  Does confer a poor prognosis.  Was on cefepime empirically given leukocytosis.  Cultures remain negative.  Finished 5 days of antibiotics.  Now discontinued.  DVT prophylaxis: SCD Code Status: DNR Family Communication: None today Disposition Plan: Status is: Inpatient Remains inpatient appropriate because: Resolving respiratory failure and encephalopathy.  Patient not agreeable to restarting hemodialysis.  Remains DNR.  Appropriate for transfer to progressive unit.   Level of care: Progressive  Consultants:  Cardiology Palliative care   Procedures:  femoral dialysis catheter, now removed  Antimicrobials:   Subjective: Seen.  Ill-appearing.  Answers questions appropriately.  Objective: Vitals:   02/08/23 1051 02/08/23 1100 02/08/23 1200 02/08/23 1300  BP:  130/76 123/77 112/80  Pulse: 100  93 94  Resp:  (!) 27 (!) 25 (!) 25  Temp:  99.1 F (37.3 C)    TempSrc:  Axillary    SpO2:  92% 96% 100%  Weight:      Height:        Intake/Output Summary (Last 24 hours) at 02/08/2023 1429 Last data filed at 02/08/2023 1351 Gross per 24 hour  Intake 640 ml  Output 3625 ml  Net -2985 ml   Filed Weights   02/06/23 0500 02/07/23 0500 02/08/23 0850  Weight: 65.4 kg 64.5 kg 72.1 kg    Examination:  General exam: No acute distress.  Frail and chronically ill Respiratory system: Bilateral crackles.  Normal work of breathing.  6 L Cardiovascular system: Tachycardic, regular rhythm, no murmurs Gastrointestinal system: Soft, NT/ND, normal bowel sounds Central nervous system: Alert and oriented. No focal neurological deficits. Extremities: Decreased by about lateral lower extremities Skin: Right mid back wound Psychiatry: Judgement and insight appear normal. Mood & affect appropriate.      Data Reviewed: I have personally reviewed following labs and imaging studies  CBC: Recent Labs  Lab 02/03/23 0956 02/04/23 0400 02/05/23 0411 02/06/23 0520 02/07/23 0629  WBC 14.0* 13.3* 11.9* 10.2 12.7*  NEUTROABS 11.9*  --   --  8.0* 10.2*  HGB 14.6 13.6 13.5 12.6* 13.2  HCT 46.4 43.0 42.7 40.4 41.4  MCV 103.1* 101.7* 103.4* 103.6* 102.7*  PLT 38* 38* 57* 73* 83*   Basic Metabolic Panel: Recent Labs  Lab 02/04/23 0305 02/04/23 0400 02/05/23 0411 02/06/23 0520 02/07/23 0629 02/08/23 0633  NA 135  --  137 141 142 143  K 3.5  --  4.0 3.8 4.0 4.1  CL 103  --  104 107 108 106  CO2 26  --  '28 28 26 28  '$ GLUCOSE 168*  --  153* 112* 143* 172*  BUN 50*  --  71* 89* 93* 96*  CREATININE 1.75*  --  2.53* 2.81* 2.87* 3.04*  CALCIUM 8.8*  --  9.2 9.3 9.2 9.5  MG  --  2.3 2.5* 2.4 2.6* 2.7*  PHOS 2.2*  --  3.5 3.4 3.6 4.5   GFR: Estimated Creatinine Clearance: 20.8 mL/min (A) (by C-G formula based on SCr of 3.04 mg/dL (H)). Liver Function Tests: Recent Labs  Lab 02/03/23 0520 02/03/23 1735 02/04/23 0305 02/05/23 0411 02/06/23 0520 02/07/23 0629 02/08/23 0633  AST 17  --   --   --   --   --   --   ALT 10  --   --   --   --   --   --   ALKPHOS 50  --   --   --   --   --   --  BILITOT 1.6*  --   --   --   --   --   --   PROT 5.0*  --   --   --   --   --   --   ALBUMIN 2.3*  2.4*   < > 2.7* 2.8* 2.6* 2.9* 2.9*   < > = values in this interval not displayed.   No results for input(s): "LIPASE", "AMYLASE" in the last 168 hours. Recent Labs  Lab 02/03/23 0956 02/05/23 1021  AMMONIA 45* 22   Coagulation Profile: No results for input(s): "INR", "PROTIME" in the last 168 hours. Cardiac Enzymes: No results for input(s): "CKTOTAL", "CKMB", "CKMBINDEX", "TROPONINI" in the last 168 hours. BNP (last 3 results) No results for input(s): "PROBNP" in the last 8760 hours. HbA1C: No results for input(s): "HGBA1C" in the last 72 hours. CBG: Recent Labs  Lab  02/07/23 1916 02/08/23 0006 02/08/23 0203 02/08/23 0406 02/08/23 0729  GLUCAP 126* 112* 125* 124* 134*   Lipid Profile: No results for input(s): "CHOL", "HDL", "LDLCALC", "TRIG", "CHOLHDL", "LDLDIRECT" in the last 72 hours. Thyroid Function Tests: No results for input(s): "TSH", "T4TOTAL", "FREET4", "T3FREE", "THYROIDAB" in the last 72 hours. Anemia Panel: No results for input(s): "VITAMINB12", "FOLATE", "FERRITIN", "TIBC", "IRON", "RETICCTPCT" in the last 72 hours. Sepsis Labs: No results for input(s): "PROCALCITON", "LATICACIDVEN" in the last 168 hours.  No results found for this or any previous visit (from the past 240 hour(s)).        Radiology Studies: DG Wrist 2 Views Right  Result Date: 02/08/2023 CLINICAL DATA:  Right wrist pain and swelling.  No known injury EXAM: RIGHT WRIST - 2 VIEW COMPARISON:  None available. FINDINGS: No acute fracture. No dislocation. 5 mm bony density projecting in the radiocarpal joint suggestive of a loose body. 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 No erosion. No periosteal elevation. Diffuse soft tissue swelling, most pronounced over the dorsal aspect of the hand and wrist. IMPRESSION: 1. Diffuse soft tissue swelling, most pronounced over the dorsal aspect of the hand and wrist. No acute osseous abnormality. 2. 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 Electronically Signed   By: Davina Poke D.O.   On: 02/08/2023 10:28        Scheduled Meds:  ascorbic acid  500 mg Oral BID   aspirin  81 mg Oral Daily   Chlorhexidine Gluconate Cloth  6 each Topical Daily   docusate sodium  100 mg Oral BID   feeding supplement  237 mL Oral TID BM   folic acid  1 mg Oral Daily   heparin injection (subcutaneous)  5,000 Units Subcutaneous Q8H   metoprolol tartrate  12.5 mg Oral BID   multivitamin with minerals  1 tablet Oral Daily   thiamine  100 mg Oral Daily   Continuous Infusions:   LOS: 10 days     Sidney Ace, MD [facility]   If 7PM-7AM, please contact night-coverage  02/08/2023, 2:29 PM

## 2023-02-08 NOTE — Progress Notes (Signed)
[name]  ROUNDING NOTE   Subjective:   [name] was admitted to [facility] on 01/29/2023 for Peripheral edema [R60.9] Shock circulatory ([place]) [R57.9] Acute on chronic congestive heart failure, unspecified heart failure type ([place]) [I50.9] Acute renal failure superimposed on chronic kidney disease, unspecified acute renal failure type, unspecified CKD stage ([place]) [N17.9, N18.9]  Patient seen and evaluated at bedside in ICU. Alert and oriented. No one at bedside.   UOP 2875 Creatinine 3.04 (2.87)  Patient states that he would consider dialysis if it was necessary.   Objective:  Vital signs in last 24 hours:  Temp:  [98.2 F (36.8 C)-99.1 F (37.3 C)] 98.6 F (37 C) (02/24 0400) Pulse Rate:  [88-113] 91 (02/24 0800) Resp:  [19-28] 20 (02/24 0800) BP: (101-155)/(73-90) 119/76 (02/24 0800) SpO2:  [87 %-99 %] 98 % (02/24 0800) Weight:  [72.1 kg] 72.1 kg (02/24 0850)  Weight change:  Filed Weights   02/06/23 0500 02/07/23 0500 02/08/23 0850  Weight: 65.4 kg 64.5 kg 72.1 kg    Intake/Output: I/O last 3 completed shifts: In: 950 [P.O.:950] Out: 3875 [Urine:3875]   Intake/Output this shift:  No intake/output data recorded.  Physical Exam: General: ill  Head: Normocephalic, atraumatic   Eyes: Anicteric  Neck: Supple  Lungs:  Basilar rhonchi, normal effort, 6 L [place] O2  Heart: regular  Abdomen:  Soft, nontender  Extremities: + dependent edema.    Neurologic: Alert and oriented  Skin: Left lower leg wound in clean and dry dressing, bilateral SCDs  Access: none    Basic Metabolic Panel: Recent Labs  Lab 02/04/23 0305 02/04/23 0400 02/05/23 0411 02/06/23 0520 02/07/23 0629 02/08/23 0633  NA 135  --  137 141 142 143  K 3.5  --  4.0 3.8 4.0 4.1  CL 103  --  104 107 108 106  CO2 26  --  '28 28 26 28  '$ GLUCOSE 168*  --  153* 112* 143* 172*  BUN 50*  --  71* 89* 93* 96*  CREATININE 1.75*  --  2.53* 2.81* 2.87* 3.04*  CALCIUM 8.8*  --  9.2 9.3 9.2  9.5  MG  --  2.3 2.5* 2.4 2.6* 2.7*  PHOS 2.2*  --  3.5 3.4 3.6 4.5     Liver Function Tests: Recent Labs  Lab 02/03/23 0520 02/03/23 1735 02/04/23 0305 02/05/23 0411 02/06/23 0520 02/07/23 0629 02/08/23 0633  AST 17  --   --   --   --   --   --   ALT 10  --   --   --   --   --   --   ALKPHOS 50  --   --   --   --   --   --   BILITOT 1.6*  --   --   --   --   --   --   PROT 5.0*  --   --   --   --   --   --   ALBUMIN 2.3*  2.4*   < > 2.7* 2.8* 2.6* 2.9* 2.9*   < > = values in this interval not displayed.    No results for input(s): "LIPASE", "AMYLASE" in the last 168 hours. Recent Labs  Lab 02/03/23 0956 02/05/23 1021  AMMONIA 45* 22     CBC: Recent Labs  Lab 02/03/23 0956 02/04/23 0400 02/05/23 0411 02/06/23 0520 02/07/23 0629  WBC 14.0* 13.3* 11.9* 10.2 12.7*  NEUTROABS 11.9*  --   --  8.0* 10.2*  HGB 14.6 13.6 13.5 12.6* 13.2  HCT 46.4 43.0 42.7 40.4 41.4  MCV 103.1* 101.7* 103.4* 103.6* 102.7*  PLT 38* 38* 57* 73* 83*     Cardiac Enzymes: No results for input(s): "CKTOTAL", "CKMB", "CKMBINDEX", "TROPONINI" in the last 168 hours.  BNP: Invalid input(s): "POCBNP"  CBG: Recent Labs  Lab 02/07/23 1916 02/08/23 0006 02/08/23 0203 02/08/23 0406 02/08/23 0729  GLUCAP 126* 112* 125* 23* 134*     Microbiology: Results for orders placed or performed during the hospital encounter of 01/29/23  Culture, Respiratory w Gram Stain     Status: None   Collection Time: 01/29/23  3:45 AM   Specimen: Tracheal Aspirate; Respiratory  Result Value Ref Range Status   Specimen Description   Final    TRACHEAL ASPIRATE Performed at [facility], [address], [place], [place] [address]    Special Requests   Final    NONE Performed at [facility], [address] [place], [place] [address]    Gram Stain   Final    MODERATE WBC PRESENT,BOTH PMN AND MONONUCLEAR NO ORGANISMS SEEN    Culture   Final    RARE Normal respiratory  flora-no Staph aureus or Pseudomonas seen Performed at [facility] [address] [place], [place] [address]    Report Status 02/01/2023 FINAL  Final  Culture, blood (Routine X 2) w Reflex to ID Panel     Status: None   Collection Time: 01/29/23  1:10 PM   Specimen: BLOOD  Result Value Ref Range Status   Specimen Description BLOOD BLOOD LEFT ARM  Final   Special Requests   Final    BOTTLES DRAWN AEROBIC AND ANAEROBIC Blood Culture adequate volume   Culture   Final    NO GROWTH 5 DAYS Performed at [facility], [address] [place], [place] [address]    Report Status 02/03/2023 FINAL  Final  MRSA Next Gen by PCR, Nasal     Status: None   Collection Time: 01/29/23  1:47 PM   Specimen: Nasal Mucosa; Nasal Swab  Result Value Ref Range Status   MRSA by PCR Next Gen NOT DETECTED NOT DETECTED Final    Comment: (NOTE) The GeneXpert MRSA Assay (FDA approved for NASAL specimens only), is one component of a comprehensive MRSA colonization surveillance program. It is not intended to diagnose MRSA infection nor to guide or monitor treatment for MRSA infections. Test performance is not FDA approved in patients less than 30 years old. Performed at [facility], [address] [place], [place] [address]   Culture, blood (Routine X 2) w Reflex to ID Panel     Status: None   Collection Time: 01/29/23  2:10 PM   Specimen: BLOOD  Result Value Ref Range Status   Specimen Description BLOOD A-LINE  Final   Special Requests   Final    BOTTLES DRAWN AEROBIC AND ANAEROBIC Blood Culture adequate volume   Culture   Final    NO GROWTH 5 DAYS Performed at [facility], [address] [place], [place] [address]    Report Status 02/03/2023 FINAL  Final    Coagulation Studies: No results for input(s): "LABPROT", "INR" in the last 72 hours.   Urinalysis: No results for input(s): "COLORURINE", "LABSPEC", "PHURINE", "GLUCOSEU", "HGBUR", "BILIRUBINUR",  "KETONESUR", "PROTEINUR", "UROBILINOGEN", "NITRITE", "LEUKOCYTESUR" in the last 72 hours.  Invalid input(s): "APPERANCEUR"     Imaging: No results found.   Medications:  ascorbic acid  500 mg Oral BID   aspirin  81 mg Oral Daily   Chlorhexidine Gluconate Cloth  6 each Topical Daily   docusate sodium  100 mg Oral BID   feeding supplement  237 mL Oral TID BM   folic acid  1 mg Oral Daily   heparin injection (subcutaneous)  5,000 Units Subcutaneous Q8H   metoprolol tartrate  12.5 mg Oral BID   multivitamin with minerals  1 tablet Oral Daily   thiamine  100 mg Oral Daily   torsemide  60 mg Oral Daily   diclofenac Sodium, docusate sodium, mouth rinse, polyethylene glycol  Assessment/ Plan:  [name] is a 77 y.o. black male with chronic diastolic congestive heart failure, hypertension, COPD, hyperlipidemia, who is admitted to [facility] on 01/29/2023 for Peripheral edema [R60.9] Shock circulatory ([place]) [R57.9] Acute on chronic congestive heart failure, unspecified heart failure type ([place]) [I50.9] Acute renal failure superimposed on chronic kidney disease, unspecified acute renal failure type, unspecified CKD stage ([place]) [N17.9, N18.9]  Acute Kidney Injury on chronic kidney disease stage IV: baseline creatinine of 2.67, GFR of 24 on 01/06/23. History of bland urine. Acute kidney injury most likely secondary to cardiorenal syndrome. Chronic kidney disease secondary to hypertension. Required CRRT from 2/16 - 2/20. Intermitted hemodialysis on 2/21 with hypotension and thrombosis. Did not tolerate treatment. The next day, patient said he did not want dialysis and his dialysis catheter was removed. However on 2/23, patient says he is open to dialysis if necessary.  Renal function continues to decline, UOP nonoliguric. Monitor daily for dialysis needs.  Holding diuretics.  Continue to monitor volume status, urine output, renal function and serum electrolytes.   Acute exacerbation of  chronic diastolic congestive heart failure and acute respiratory failure requiring intubation and mechanical ventilation.  Extubated on 02/05/23. However now on 6L [place]. Currently off diuretics. Home regimen was torsemide '60mg'$  daily and metolazone 2.'5mg'$  twice a week.   Hypertension: with hypotension with cardiogenic shock required vasopressors on admission. 123/82 - currently on metoprolol for rate control and blood pressure.    LOS: 10 Jasmin Trumbull 2/24/20249:09 AM

## 2023-02-08 NOTE — Progress Notes (Signed)
Patient transferred to room 238 by bed with Gerald Stabs, NT. Patient alert with no distress noted when leaving ICU. Patient on tele and o2 for transport. Report called prior to [place], Therapist, sports. Patient's significant other [place] visited this afternoon and is aware that patient is moving to room 238.

## 2023-02-09 ENCOUNTER — Inpatient Hospital Stay: Payer: Medicare HMO

## 2023-02-09 DIAGNOSIS — R579 Shock, unspecified: Secondary | ICD-10-CM | POA: Diagnosis not present

## 2023-02-09 LAB — RENAL FUNCTION PANEL
Albumin: 2.6 g/dL — ABNORMAL LOW (ref 3.5–5.0)
Anion gap: 8 (ref 5–15)
BUN: 97 mg/dL — ABNORMAL HIGH (ref 8–23)
CO2: 30 mmol/L (ref 22–32)
Calcium: 9.1 mg/dL (ref 8.9–10.3)
Chloride: 105 mmol/L (ref 98–111)
Creatinine, Ser: 3.13 mg/dL — ABNORMAL HIGH (ref 0.61–1.24)
GFR, Estimated: 20 mL/min — ABNORMAL LOW (ref >60)
Glucose, Bld: 126 mg/dL — ABNORMAL HIGH (ref 70–99)
Phosphorus: 4.3 mg/dL (ref 2.5–4.6)
Potassium: 3.8 mmol/L (ref 3.5–5.1)
Sodium: 143 mmol/L (ref 135–145)

## 2023-02-09 LAB — MAGNESIUM: Magnesium: 2.5 mg/dL — ABNORMAL HIGH (ref 1.7–2.4)

## 2023-02-09 NOTE — H&P (View-Only) (Signed)
[facility] Vascular Consult Note  MRN : [id number]  John [name] is a 77 y.o. (1946-07-06) male who presents with chief complaint of  Chief Complaint  Patient presents with   Leg Swelling  .  History of Present Illness: Patient history of CKD IV not previously on HD- is admitted to the hospital with CHF, shock and has developed acute renal failure.  The patient reports shortness of breath and fatigue.  The patient is critically ill with ARF, worsening renal failure. The nephrology service has decided to initiate dialysis at this time, and we are asked to place a temporary dialysis catheter for immediate dialysis use.    Current Facility-Administered Medications  Medication Dose Route Frequency Provider Last Rate Last Admin   ascorbic acid (VITAMIN C) tablet 500 mg  500 mg Oral BID Ralene Muskrat B, MD   500 mg at 02/09/23 [id number]   aspirin chewable tablet 81 mg  81 mg Oral Daily Benita Gutter, RPH   81 mg at 02/09/23 [id number]   Chlorhexidine Gluconate Cloth 2 % PADS 6 each  6 each Topical Daily Rust-Chester, Huel Cote, NP   6 each at 02/09/23 0446   diclofenac Sodium (VOLTAREN) 1 % topical gel 4 g  4 g Topical Q6H PRN Sharion Settler, NP   4 g at 02/07/23 2128   docusate sodium (COLACE) capsule 100 mg  100 mg Oral BID PRN Ottie Glazier, MD       docusate sodium (COLACE) capsule 100 mg  100 mg Oral BID Benita Gutter, RPH       feeding supplement (ENSURE ENLIVE / ENSURE PLUS) liquid 237 mL  237 mL Oral TID BM Ralene Muskrat B, MD   237 mL at [id number]   folic acid (FOLVITE) tablet 1 mg  1 mg Oral Daily Benita Gutter, RPH   1 mg at 02/09/23 [id number]   heparin injection 5,000 Units  5,000 Units Subcutaneous Q8H Armando Reichert, MD   5,000 Units at 02/08/23 1521   metoprolol tartrate (LOPRESSOR) tablet 12.5 mg  12.5 mg Oral BID Tristan Schroeder, PA-C   12.5 mg at 02/09/23 [id number]   multivitamin with minerals tablet 1 tablet  1 tablet Oral Daily Ralene Muskrat B, MD   1 tablet at 02/09/23 [id number]   Oral care mouth rinse  15 mL Mouth Rinse PRN Armando Reichert, MD       polyethylene glycol (MIRALAX / GLYCOLAX) packet 17 g  17 g Oral Daily PRN Ottie Glazier, MD       thiamine (VITAMIN B1) tablet 100 mg  100 mg Oral Daily Benita Gutter, RPH   100 mg at 02/09/23 [id number]    Past Medical History:  Diagnosis Date   CHF (congestive heart failure) ([place])    EF 45% in 2021   Chronic kidney disease    COPD (chronic obstructive pulmonary disease) ([place]) 02/16/2020   Hyperlipidemia    Hypertension    Pneumonia 2021    Past Surgical History:  Procedure Laterality Date   CATARACT EXTRACTION W/PHACO Left 10/31/2021   Procedure: CATARACT EXTRACTION PHACO AND INTRAOCULAR LENS PLACEMENT ([place]) LEFT VISION BLUE 3.45 00:48.0;  Surgeon: Leandrew Koyanagi, MD;  Location: [place];  Service: Ophthalmology;  Laterality: Left;   CATARACT EXTRACTION W/PHACO Right 11/14/2021   Procedure: CATARACT EXTRACTION PHACO AND INTRAOCULAR LENS PLACEMENT ([facility]) RIGHT 8.59 01:10.5;  Surgeon: Leandrew Koyanagi, MD;  Location: [facility];  Service: Ophthalmology;  Laterality: Right;   INGUINAL  HERNIA REPAIR Right 02/04/2017   Procedure: HERNIA REPAIR INGUINAL ADULT;  Surgeon: Leonie Green, MD;  Location: [facility];  Service: General;  Laterality: Right;   NO PAST SURGERIES     QUADRICEPS TENDON REPAIR Left 02/15/2020   Procedure: REPAIR QUADRICEP TENDON;  Surgeon: Corky Mull, MD;  Location: [facility];  Service: Orthopedics;  Laterality: Left;    Social History Social History   Tobacco Use   Smoking status: Former    Packs/day: 0.25    Years: 53.00    Total pack years: 13.25    Types: Cigarettes    Quit date: 02/15/2015    Years since quitting: 7.9   Smokeless tobacco: Never  Substance Use Topics   Alcohol use: Yes    Comment: occassional   Drug use: No    Family History Family History  Problem Relation Age of Onset   Diabetes Mother     Diabetes Brother    Diabetes Maternal Grandmother    Diabetes Paternal Grandmother     No Known Allergies   REVIEW OF SYSTEMS (Negative unless checked)  Constitutional: '[]'$ Weight loss  '[]'$ Fever  '[]'$ Chills Cardiac: '[]'$ Chest pain   '[]'$ Chest pressure   '[]'$ Palpitations   '[x]'$ Shortness of breath when laying flat   '[x]'$ Shortness of breath at rest   '[x]'$ Shortness of breath with exertion. Vascular:  '[]'$ Pain in legs with walking   '[]'$ Pain in legs at rest   '[]'$ Pain in legs when laying flat   '[]'$ Claudication   '[]'$ Pain in feet when walking  '[]'$ Pain in feet at rest  '[]'$ Pain in feet when laying flat   '[]'$ History of DVT   '[]'$ Phlebitis   '[x]'$ Swelling in legs   '[]'$ Varicose veins   '[]'$ Non-healing ulcers Pulmonary:   '[]'$ Uses home oxygen   '[]'$ Productive cough   '[]'$ Hemoptysis   '[]'$ Wheeze  '[]'$ COPD   '[]'$ Asthma Neurologic:  '[]'$ Dizziness  '[]'$ Blackouts   '[]'$ Seizures   '[]'$ History of stroke   '[]'$ History of TIA  '[]'$ Aphasia   '[]'$ Temporary blindness   '[]'$ Dysphagia   '[]'$ Weakness or numbness in arms   '[]'$ Weakness or numbness in legs Musculoskeletal:  '[]'$ Arthritis   '[]'$ Joint swelling   '[]'$ Joint pain   '[]'$ Low back pain Hematologic:  '[]'$ Easy bruising  '[]'$ Easy bleeding   '[]'$ Hypercoagulable state   '[]'$ Anemic  '[]'$ Hepatitis Gastrointestinal:  '[]'$ Blood in stool   '[]'$ Vomiting blood  '[]'$ Gastroesophageal reflux/heartburn   '[]'$ Difficulty swallowing. Genitourinary:  '[x]'$ Chronic kidney disease   '[]'$ Difficult urination  '[]'$ Frequent urination  '[]'$ Burning with urination   '[]'$ Blood in urine Skin:  '[]'$ Rashes   '[]'$ Ulcers   '[]'$ Wounds Psychological:  '[]'$ History of anxiety   '[]'$  History of major depression.  Unable to obtain the review of systems due to the patient's severe systemic illness and altered mental status.       Physical Examination  Vitals:   02/08/23 1550 02/08/23 1955 02/09/23 0309 02/09/23 0823  BP: 107/70 127/77 111/74 (!) 121/91  Pulse: (!) 101 (!) 101 91 88  Resp: '20 20 20   '$ Temp: 98 F (36.7 C) 98 F (36.7 C) 98 F (36.7 C) (!) 97.5 F (36.4 C)  TempSrc: Oral Oral Oral  Oral  SpO2: (!) 87% 91% 94% 96%  Weight:      Height:       Body mass index is 25.43 kg/m. Gen: WD/WN Head: [place]/AT, No temporalis wasting Ear/Nose/Throat: Hearing grossly intact, nares w/o erythema or drainage Eyes: Sclera non-icteric, conjunctiva clear Neck: Supple, no nuchal rigidity.  No JVD.  Pulmonary:  [place]  Cardiac: RRR, normal S1, S2, no Murmurs,  rubs or gallops. Vascular: palpable radial bilaterally Gastrointestinal: soft, non-tender/non-distended. No guarding/reflex.  Musculoskeletal: M/S 5/5 throughout.  Extremities without ischemic changes.  No deformity or atrophy. 2+ Edema in the lower extremities bilaterally Neurologic: alert Psychiatric: Difficult to assess due to the severity of patient's illness. Dermatologic: No rashes or ulcers noted.   Lymph : No Cervical, Axillary, or Inguinal lymphadenopathy.     CBC Lab Results  Component Value Date   WBC 12.7 (H) 02/07/2023   HGB 13.2 02/07/2023   HCT 41.4 02/07/2023   MCV 102.7 (H) 02/07/2023   PLT 83 (L) 02/07/2023    BMET    Component Value Date/Time   NA 143 02/09/2023 0600   K 3.8 02/09/2023 0600   CL 105 02/09/2023 0600   CO2 30 02/09/2023 0600   GLUCOSE 126 (H) 02/09/2023 0600   BUN 97 (H) 02/09/2023 0600   CREATININE 3.13 (H) 02/09/2023 0600   CALCIUM 9.1 02/09/2023 0600   GFRNONAA 20 (L) 02/09/2023 0600   GFRAA 50 (L) 07/03/2020 0248   Estimated Creatinine Clearance: 21.1 mL/min (A) (by C-G formula based on SCr of 3.13 mg/dL (H)).  COAG Lab Results  Component Value Date   INR 1.3 (H) 01/29/2023   INR 1.3 (H) 11/20/2022    Radiology DG Wrist 2 Views Right  Result Date: 02/08/2023 CLINICAL DATA:  Right wrist pain and swelling.  No known injury EXAM: RIGHT WRIST - 2 VIEW COMPARISON:  None available. FINDINGS: No acute fracture. No dislocation. 5 mm bony density projecting in the radiocarpal joint suggestive of a loose body. 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 No  erosion. No periosteal elevation. Diffuse soft tissue swelling, most pronounced over the dorsal aspect of the hand and wrist. IMPRESSION: 1. Diffuse soft tissue swelling, most pronounced over the dorsal aspect of the hand and wrist. No acute osseous abnormality. 2. 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 Electronically Signed   By: Davina Poke D.O.   On: 02/08/2023 10:28   DG Chest Port 1 View  Result Date: 02/04/2023 CLINICAL DATA:  Acute respiratory failure with hypoxia. EXAM: PORTABLE CHEST 1 VIEW COMPARISON:  Chest radiograph February 01, 2023. FINDINGS: Endotracheal tube with tip in the midthoracic trachea 4.8 cm from the carina. Esophageal temperature probe in place. Nasogastric tube courses below the diaphragm with tip obscured by collimation. Additional cardiac monitoring leads project over the chest. Normal size heart. Aortic atherosclerosis. Apparent widening of the mediastinal vascular pedicle is favored secondary to rotation. Layering small bilateral pleural effusions with persistent right-greater-than-left airspace opacitie. No acute osseous abnormality IMPRESSION: 1. Stable layering small bilateral pleural effusions with persistent right-greater-than-left airspace opacities. Electronically Signed   By: Dahlia Bailiff M.D.   On: 02/04/2023 08:24   DG Abd 1 View  Result Date: 02/03/2023 CLINICAL DATA:  Vomiting EXAM: ABDOMEN - 1 VIEW COMPARISON:  01/30/2023 FINDINGS: Nasogastric tube noted with tip and side port in the stomach body. Gas fills the stomach. Mild scarring or atelectasis at the left lung base. A catheter projects over the left groin and extends over into the right abdomen, presumably a venous catheter. There is also a small amount of tubing projecting over the right hip. There is evidence of lumbar spondylosis and degenerative disc disease. Unremarkable bowel gas pattern. IMPRESSION: 1. Nasogastric tube tip and side port are in the stomach body. 2. Mild  scarring or atelectasis at the left lung base. 3. Unremarkable bowel gas pattern. Electronically Signed   By: Van Clines  M.D.   On: 02/03/2023 15:26   CT HEAD WO CONTRAST (5MM)  Result Date: 02/03/2023 CLINICAL DATA:  Altered mental status EXAM: CT HEAD WITHOUT CONTRAST TECHNIQUE: Contiguous axial images were obtained from the base of the skull through the vertex without intravenous contrast. RADIATION DOSE REDUCTION: This exam was performed according to the departmental dose-optimization program which includes automated exposure control, adjustment of the mA and/or kV according to patient size and/or use of iterative reconstruction technique. COMPARISON:  None Available. FINDINGS: Brain: No evidence of acute infarction, hemorrhage, mass, mass effect, or midline shift. No hydrocephalus or extra-axial fluid collection. Partial empty sella. Normal craniocervical junction. Cerebral volume is within normal limits for age. Vascular: No hyperdense vessel. Skull: Negative for fracture or focal lesion. Sinuses/Orbits: Mucosal thickening in the left greater than right maxillary sinus. Other: Underpneumatized right mastoid, with trace fluid. The left mastoid air cells are well aerated. Endotracheal and orogastric tubes noted. IMPRESSION: No acute intracranial process. Electronically Signed   By: Merilyn Baba M.D.   On: 02/03/2023 03:03   DG Chest Port 1 View  Result Date: 02/01/2023 CLINICAL DATA:  Dyspnea EXAM: PORTABLE CHEST 1 VIEW COMPARISON:  01/29/2023 FINDINGS: There is pulmonary vascular congestion. Bibasilar consolidation or volume loss. No pneumothorax. Possible small pleural effusions. Endotracheal tube tip 3 cm above carina. NG tube tip below the diaphragm and off x-ray. Mediastinal drain or probe overlies the mid thorax. IMPRESSION: Vascular congestion.  Bibasilar consolidation or volume loss. Electronically Signed   By: Sammie Bench M.D.   On: 02/01/2023 13:56   CT Angio Chest Pulmonary  Embolism (PE) W or WO Contrast  Result Date: 01/30/2023 CLINICAL DATA:  Chronic hypoxemia and hypercapnia related to COPD, congestive heart failure and chronic renal failure. EXAM: CT ANGIOGRAPHY CHEST WITH CONTRAST TECHNIQUE: Multidetector CT imaging of the chest was performed using the standard protocol during bolus administration of intravenous contrast. Multiplanar CT image reconstructions and MIPs were obtained to evaluate the vascular anatomy. RADIATION DOSE REDUCTION: This exam was performed according to the departmental dose-optimization program which includes automated exposure control, adjustment of the mA and/or kV according to patient size and/or use of iterative reconstruction technique. CONTRAST:  29m OMNIPAQUE IOHEXOL 350 MG/ML SOLN COMPARISON:  Portable chest obtained yesterday. Chest, abdomen and pelvis CT dated 01/18/2022. FINDINGS: Cardiovascular: Enlarged heart. Enlarged ascending thoracic aorta measuring 5.2 cm in diameter at the level of the main pulmonary artery. Enlarged central pulmonary arteries with a main pulmonary artery measuring 3.6 cm in diameter. Maximum aortic arch diameter of 3.2 cm. Descending thoracic aortic diameter of 3.1 cm. Normally opacified pulmonary arteries with no pulmonary arterial filling defects seen. Atheromatous calcifications, including the coronary arteries and aorta. Mediastinum/Nodes: Endotracheal tube in satisfactory position. Enteric tube extending through the esophagus with its tip in the proximal to mid stomach. No enlarged lymph nodes. Unremarkable thyroid gland. Lungs/Pleura: Small to moderate-sized right pleural effusion and small left pleural effusion. Diffuse bilateral centrilobular and some paraseptal bullous changes, most pronounced in the upper lobes. Mild bilateral lower lobe dependent atelectasis. No airspace consolidation suspicious for pneumonia. No lung nodules. Upper Abdomen: The liver has mildly nodular contours with somewhat prominent  lateral segment left lobe and caudate lobe and somewhat small right lobe. Two small probable cysts in the right lobe of the liver. Normal sized spleen. Small to moderate amount of ascites in the upper abdomen. Musculoskeletal: Minimal thoracic spine degenerative changes. Review of the MIP images confirms the above findings. IMPRESSION: 1. No pulmonary emboli. 2. Ascending  thoracic aortic aneurysm measuring 5.2 cm in maximum diameter. Recommend semi-annual imaging followup by CTA or MRA and referral to cardiothoracic surgery if not already obtained. This recommendation follows 2010 ACCF/AHA/AATS/ACR/ASA/SCA/SCAI/SIR/STS/SVM Guidelines for the Diagnosis and Management of Patients With Thoracic Aortic Disease. Circulation. 2010; [id number]. Aortic aneurysm NOS (ICD10-I71.9) 3. Enlarged central pulmonary arteries compatible with pulmonary arterial hypertension. 4. Small to moderate-sized right pleural effusion and small left pleural effusion. 5. Changes of COPD and centrilobular and paraseptal emphysema. 6. Findings suggesting cirrhosis of the liver with an associated small to moderate amount of ascites in the upper abdomen. 7.  Calcific coronary artery and aortic atherosclerosis. Aortic Atherosclerosis (ICD10-I70.0) and Emphysema (ICD10-J43.9). Electronically Signed   By: Claudie Revering M.D.   On: 01/30/2023 18:48   ECHOCARDIOGRAM COMPLETE  Result Date: 01/30/2023    ECHOCARDIOGRAM REPORT   Patient Name:   Pier Gayle Date of Exam: 01/30/2023 Medical Rec #:  [id number]      Height:       71.0 in Accession #:    [id number]     Weight:       223.3 lb Date of Birth:  1946-10-21     BSA:          2.210 m Patient Age:    40 years       BP:           163/69 mmHg Patient Gender: M              HR:           58 bpm. Exam Location:  [facility] Procedure: 2D Echo, Cardiac Doppler and Color Doppler Indications:     Elevated Troponin  History:         Patient has prior history of Echocardiogram examinations, most                   recent 10/02/2022. CHF, COPD; Risk Factors:Hypertension.                  Cardiogenic shock, ESRD.  Sonographer:     Wenda Low Referring Phys:  [id number] BRITTON L RUST-CHESTER Diagnosing Phys: Ida Rogue MD  Sonographer Comments: Suboptimal apical window and echo performed with patient supine and on artificial respirator. IMPRESSIONS  1. Left ventricular ejection fraction, by estimation, is 50 to 55%. The left ventricle has low normal function. The left ventricle has no regional wall motion abnormalities. There is mild left ventricular hypertrophy. Left ventricular diastolic parameters are consistent with Grade I diastolic dysfunction (impaired relaxation). There is the interventricular septum is flattened in systole and diastole, consistent with right ventricular pressure and volume overload.  2. Right ventricular systolic function is severely reduced. The right ventricular size is moderately enlarged. There is severely elevated pulmonary artery systolic pressure. The estimated right ventricular systolic pressure is [id number] mmHg.  3. Right atrial size was moderately dilated.  4. The mitral valve is normal in structure. Mild mitral valve regurgitation. No evidence of mitral stenosis. There is mild late systolic prolapse of both leaflets of the mitral valve.  5. Tricuspid valve regurgitation is mild to moderate.  6. The aortic valve is tricuspid. Aortic valve regurgitation is mild to moderate. No aortic stenosis is present.  7. The inferior vena cava is dilated in size with <50% respiratory variability, suggesting right atrial pressure of 15 mmHg.  8. There is mild dilatation of the aortic root, measuring 44 mm. There is mild dilatation of the ascending aorta, measuring 43 mm. FINDINGS  Left Ventricle: Left ventricular ejection fraction, by estimation, is 50 to 55%. The left ventricle has low normal function. The left ventricle has no regional wall motion abnormalities. The left ventricular internal cavity  size was normal in size. There is mild left ventricular hypertrophy. The interventricular septum is flattened in systole and diastole, consistent with right ventricular pressure and volume overload. Left ventricular diastolic parameters are consistent with Grade I diastolic dysfunction (impaired relaxation). Right Ventricle: The right ventricular size is moderately enlarged. No increase in right ventricular wall thickness. Right ventricular systolic function is severely reduced. There is severely elevated pulmonary artery systolic pressure. The tricuspid regurgitant velocity is 3.45 m/s, and with an assumed right atrial pressure of 15 mmHg, the estimated right ventricular systolic pressure is [id number]. Left Atrium: Left atrial size was normal in size. Right Atrium: Right atrial size was moderately dilated. Pericardium: There is no evidence of pericardial effusion. Mitral Valve: The mitral valve is normal in structure. There is mild late systolic prolapse of both leaflets of the mitral valve. Mild mitral valve regurgitation. No evidence of mitral valve stenosis. MV peak gradient, 3.9 mmHg. The mean mitral valve gradient is 1.0 mmHg. Tricuspid Valve: The tricuspid valve is normal in structure. Tricuspid valve regurgitation is mild to moderate. No evidence of tricuspid stenosis. Aortic Valve: The aortic valve is tricuspid. Aortic valve regurgitation is mild to moderate. No aortic stenosis is present. Aortic valve mean gradient measures 1.0 mmHg. Aortic valve peak gradient measures 3.9 mmHg. Aortic valve area, by VTI measures 3.79 cm. Pulmonic Valve: The pulmonic valve was normal in structure. Pulmonic valve regurgitation is mild to moderate. No evidence of pulmonic stenosis. Aorta: The aortic root is normal in size and structure. There is mild dilatation of the aortic root, measuring 44 mm. There is mild dilatation of the ascending aorta, measuring 43 mm. Venous: The inferior vena cava is dilated in size with less  than 50% respiratory variability, suggesting right atrial pressure of 15 mmHg. IAS/Shunts: No atrial level shunt detected by color flow Doppler.  LEFT VENTRICLE PLAX 2D LVIDd:         4.40 cm   Diastology LVIDs:         3.00 cm   LV e' medial:    6.85 cm/s LV PW:         1.30 cm   LV E/e' medial:  6.3 LV IVS:        1.30 cm   LV e' lateral:   5.98 cm/s LVOT diam:     2.10 cm   LV E/e' lateral: 7.2 LV SV:         68 LV SV Index:   31 LVOT Area:     3.46 cm  RIGHT VENTRICLE RV Basal diam:  5.10 cm RV Mid diam:    5.90 cm RV S prime:     14.50 cm/s TAPSE (M-mode): 1.8 cm LEFT ATRIUM             Index        RIGHT ATRIUM           Index LA diam:        2.90 cm 1.31 cm/m   RA Area:     27.50 cm LA Vol (A2C):   59.3 ml 26.84 ml/m  RA Volume:   101.00 ml 45.71 ml/m LA Vol (A4C):   59.1 ml 26.74 ml/m LA Biplane Vol: 59.4 ml 26.88 ml/m  AORTIC VALVE  PULMONIC VALVE AV Area (Vmax):    3.22 cm     PV Vmax:       1.00 m/s AV Area (Vmean):   4.15 cm     PV Peak grad:  4.0 mmHg AV Area (VTI):     3.79 cm AV Vmax:           98.70 cm/s AV Vmean:          45.500 cm/s AV VTI:            0.180 m AV Peak Grad:      3.9 mmHg AV Mean Grad:      1.0 mmHg LVOT Vmax:         91.80 cm/s LVOT Vmean:        54.500 cm/s LVOT VTI:          0.197 m LVOT/AV VTI ratio: 1.09  AORTA Ao Root diam: 4.40 cm Ao Asc diam:  4.30 cm MITRAL VALVE               TRICUSPID VALVE MV Area (PHT): 2.10 cm    TR Peak grad:   47.6 mmHg MV Area VTI:   2.38 cm    TR Vmax:        345.00 cm/s MV Peak grad:  3.9 mmHg MV Mean grad:  1.0 mmHg    SHUNTS MV Vmax:       0.99 m/s    Systemic VTI:  0.20 m MV Vmean:      42.3 cm/s   Systemic Diam: 2.10 cm MV Decel Time: 361 msec MV E velocity: 43.00 cm/s MV A velocity: 84.90 cm/s MV E/A ratio:  0.51 Ida Rogue MD Electronically signed by Ida Rogue MD Signature Date/Time: 01/30/2023/4:10:59 PM    Final    DG Abd Portable 1V  Result Date: 01/30/2023 CLINICAL DATA:  NGT placement EXAM:  PORTABLE ABDOMEN - 1 VIEW COMPARISON:  01/29/2023 FINDINGS: Image of the upper abdomen demonstrates a NG tube superimposed with the stomach below the diaphragm. IMPRESSION: NG tube appropriately positioned. Electronically Signed   By: Sammie Bench M.D.   On: 01/30/2023 13:12   DG Chest Port 1 View  Result Date: 01/29/2023 CLINICAL DATA:  Intubation EXAM: PORTABLE CHEST 1 VIEW COMPARISON:  01/29/2023 8:30 a.m. FINDINGS: Interval placement of endotracheal tube, tip over the midtrachea, and esophagogastric tube, tip not visualized although below the diaphragm. Cardiomegaly. Small bilateral pleural effusions. Osseous structures unremarkable. IMPRESSION: 1. Interval placement of endotracheal tube, tip over the midtrachea, and esophagogastric tube, tip not visualized although below the diaphragm. 2. Cardiomegaly. 3. Small bilateral pleural effusions. Electronically Signed   By: Delanna Ahmadi M.D.   On: 01/29/2023 17:03   DG Abd 1 View  Result Date: 01/29/2023 CLINICAL DATA:  Orogastric tube placement. EXAM: ABDOMEN - 1 VIEW COMPARISON:  None Available. FINDINGS: Distal tip of nasogastric tube is seen in expected position of gastroesophageal junction. No abnormal bowel dilatation is noted. IMPRESSION: Distal tip of nasogastric tube is seen in expected position of gastroesophageal junction. Advancement is recommended. Electronically Signed   By: Marijo Conception M.D.   On: 01/29/2023 17:02   DG Chest Portable 1 View  Result Date: 01/29/2023 CLINICAL DATA:  Shortness of breath. EXAM: PORTABLE CHEST 1 VIEW COMPARISON:  01/21/2022 FINDINGS: Leftward patient rotation. The cardio pericardial silhouette is enlarged. Vascular congestion noted with bibasilar atelectasis/infiltrate and probable layering small bilateral effusions. The visualized bony structures of the thorax are unremarkable. Telemetry leads overlie the chest.  IMPRESSION: Vascular congestion with bibasilar atelectasis/infiltrate and probable layering  small bilateral effusions. Electronically Signed   By: Misty Stanley M.D.   On: 01/29/2023 08:38      Assessment/Plan 1. ARF on CKD IV 2. Will plan for Placement of Permacath tomorrow- discussed with patient- agreeable. All questions answered. Understanding expressed. 3. Will Order Vein Mapping for AV fistula placement in the near term   We will proceed with temporary dialysis catheter placement at this time.  Risks and benefits discussed with patient and/or family, and the catheter will be placed to allow immediate initiation of dialysis.  If the patient's renal function does not improve throughout the hospital course, we will be happy to place a tunneled dialysis catheter for long term use prior to [email or web address]     Evaristo Bury, MD  02/09/2023 10:16 AM

## 2023-02-09 NOTE — Consult Note (Signed)
[facility] Vascular Consult Note  MRN : [id number]  John [name] is a 77 y.o. (1946-02-17) male who presents with chief complaint of  Chief Complaint  Patient presents with   Leg Swelling  .  History of Present Illness: Patient history of CKD IV not previously on HD- is admitted to the hospital with CHF, shock and has developed acute renal failure.  The patient reports shortness of breath and fatigue.  The patient is critically ill with ARF, worsening renal failure. The nephrology service has decided to initiate dialysis at this time, and we are asked to place a temporary dialysis catheter for immediate dialysis use.    Current Facility-Administered Medications  Medication Dose Route Frequency Provider Last Rate Last Admin   ascorbic acid (VITAMIN C) tablet 500 mg  500 mg Oral BID Ralene Muskrat B, MD   500 mg at 02/09/23 [id number]   aspirin chewable tablet 81 mg  81 mg Oral Daily Benita Gutter, RPH   81 mg at 02/09/23 [id number]   Chlorhexidine Gluconate Cloth 2 % PADS 6 each  6 each Topical Daily Rust-Chester, Huel Cote, NP   6 each at 02/09/23 0446   diclofenac Sodium (VOLTAREN) 1 % topical gel 4 g  4 g Topical Q6H PRN Sharion Settler, NP   4 g at 02/07/23 2128   docusate sodium (COLACE) capsule 100 mg  100 mg Oral BID PRN Ottie Glazier, MD       docusate sodium (COLACE) capsule 100 mg  100 mg Oral BID Benita Gutter, RPH       feeding supplement (ENSURE ENLIVE / ENSURE PLUS) liquid 237 mL  237 mL Oral TID BM Ralene Muskrat B, MD   237 mL at [id number]   folic acid (FOLVITE) tablet 1 mg  1 mg Oral Daily Benita Gutter, RPH   1 mg at 02/09/23 [id number]   heparin injection 5,000 Units  5,000 Units Subcutaneous Q8H Armando Reichert, MD   5,000 Units at 02/08/23 1521   metoprolol tartrate (LOPRESSOR) tablet 12.5 mg  12.5 mg Oral BID Tristan Schroeder, PA-C   12.5 mg at 02/09/23 [id number]   multivitamin with minerals tablet 1 tablet  1 tablet Oral Daily Ralene Muskrat B, MD   1 tablet at 02/09/23 [id number]   Oral care mouth rinse  15 mL Mouth Rinse PRN Armando Reichert, MD       polyethylene glycol (MIRALAX / GLYCOLAX) packet 17 g  17 g Oral Daily PRN Ottie Glazier, MD       thiamine (VITAMIN B1) tablet 100 mg  100 mg Oral Daily Benita Gutter, RPH   100 mg at 02/09/23 [id number]    Past Medical History:  Diagnosis Date   CHF (congestive heart failure) ([place])    EF 45% in 2021   Chronic kidney disease    COPD (chronic obstructive pulmonary disease) ([place]) 02/16/2020   Hyperlipidemia    Hypertension    Pneumonia 2021    Past Surgical History:  Procedure Laterality Date   CATARACT EXTRACTION W/PHACO Left 10/31/2021   Procedure: CATARACT EXTRACTION PHACO AND INTRAOCULAR LENS PLACEMENT ([place]) LEFT VISION BLUE 3.45 00:48.0;  Surgeon: Leandrew Koyanagi, MD;  Location: [facility];  Service: Ophthalmology;  Laterality: Left;   CATARACT EXTRACTION W/PHACO Right 11/14/2021   Procedure: CATARACT EXTRACTION PHACO AND INTRAOCULAR LENS PLACEMENT ([facility]) RIGHT 8.59 01:10.5;  Surgeon: Leandrew Koyanagi, MD;  Location: [facility];  Service: Ophthalmology;  Laterality: Right;   INGUINAL  HERNIA REPAIR Right 02/04/2017   Procedure: HERNIA REPAIR INGUINAL ADULT;  Surgeon: Leonie Green, MD;  Location: [facility];  Service: General;  Laterality: Right;   NO PAST SURGERIES     QUADRICEPS TENDON REPAIR Left 02/15/2020   Procedure: REPAIR QUADRICEP TENDON;  Surgeon: Corky Mull, MD;  Location: [facility];  Service: Orthopedics;  Laterality: Left;    Social History Social History   Tobacco Use   Smoking status: Former    Packs/day: 0.25    Years: 53.00    Total pack years: 13.25    Types: Cigarettes    Quit date: 02/15/2015    Years since quitting: 7.9   Smokeless tobacco: Never  Substance Use Topics   Alcohol use: Yes    Comment: occassional   Drug use: No    Family History Family History  Problem Relation Age of Onset   Diabetes Mother     Diabetes Brother    Diabetes Maternal Grandmother    Diabetes Paternal Grandmother     No Known Allergies   REVIEW OF SYSTEMS (Negative unless checked)  Constitutional: '[]'$ Weight loss  '[]'$ Fever  '[]'$ Chills Cardiac: '[]'$ Chest pain   '[]'$ Chest pressure   '[]'$ Palpitations   '[x]'$ Shortness of breath when laying flat   '[x]'$ Shortness of breath at rest   '[x]'$ Shortness of breath with exertion. Vascular:  '[]'$ Pain in legs with walking   '[]'$ Pain in legs at rest   '[]'$ Pain in legs when laying flat   '[]'$ Claudication   '[]'$ Pain in feet when walking  '[]'$ Pain in feet at rest  '[]'$ Pain in feet when laying flat   '[]'$ History of DVT   '[]'$ Phlebitis   '[x]'$ Swelling in legs   '[]'$ Varicose veins   '[]'$ Non-healing ulcers Pulmonary:   '[]'$ Uses home oxygen   '[]'$ Productive cough   '[]'$ Hemoptysis   '[]'$ Wheeze  '[]'$ COPD   '[]'$ Asthma Neurologic:  '[]'$ Dizziness  '[]'$ Blackouts   '[]'$ Seizures   '[]'$ History of stroke   '[]'$ History of TIA  '[]'$ Aphasia   '[]'$ Temporary blindness   '[]'$ Dysphagia   '[]'$ Weakness or numbness in arms   '[]'$ Weakness or numbness in legs Musculoskeletal:  '[]'$ Arthritis   '[]'$ Joint swelling   '[]'$ Joint pain   '[]'$ Low back pain Hematologic:  '[]'$ Easy bruising  '[]'$ Easy bleeding   '[]'$ Hypercoagulable state   '[]'$ Anemic  '[]'$ Hepatitis Gastrointestinal:  '[]'$ Blood in stool   '[]'$ Vomiting blood  '[]'$ Gastroesophageal reflux/heartburn   '[]'$ Difficulty swallowing. Genitourinary:  '[x]'$ Chronic kidney disease   '[]'$ Difficult urination  '[]'$ Frequent urination  '[]'$ Burning with urination   '[]'$ Blood in urine Skin:  '[]'$ Rashes   '[]'$ Ulcers   '[]'$ Wounds Psychological:  '[]'$ History of anxiety   '[]'$  History of major depression.  Unable to obtain the review of systems due to the patient's severe systemic illness and altered mental status.       Physical Examination  Vitals:   02/08/23 1550 02/08/23 1955 02/09/23 0309 02/09/23 0823  BP: 107/70 127/77 111/74 (!) 121/91  Pulse: (!) 101 (!) 101 91 88  Resp: '20 20 20   '$ Temp: 98 F (36.7 C) 98 F (36.7 C) 98 F (36.7 C) (!) 97.5 F (36.4 C)  TempSrc: Oral Oral Oral  Oral  SpO2: (!) 87% 91% 94% 96%  Weight:      Height:       Body mass index is 25.43 kg/m. Gen: WD/WN Head: [place]/AT, No temporalis wasting Ear/Nose/Throat: Hearing grossly intact, nares w/o erythema or drainage Eyes: Sclera non-icteric, conjunctiva clear Neck: Supple, no nuchal rigidity.  No JVD.  Pulmonary:  [place]  Cardiac: RRR, normal S1, S2, no Murmurs,  rubs or gallops. Vascular: palpable radial bilaterally Gastrointestinal: soft, non-tender/non-distended. No guarding/reflex.  Musculoskeletal: M/S 5/5 throughout.  Extremities without ischemic changes.  No deformity or atrophy. 2+ Edema in the lower extremities bilaterally Neurologic: alert Psychiatric: Difficult to assess due to the severity of patient's illness. Dermatologic: No rashes or ulcers noted.   Lymph : No Cervical, Axillary, or Inguinal lymphadenopathy.     CBC Lab Results  Component Value Date   WBC 12.7 (H) 02/07/2023   HGB 13.2 02/07/2023   HCT 41.4 02/07/2023   MCV 102.7 (H) 02/07/2023   PLT 83 (L) 02/07/2023    BMET    Component Value Date/Time   NA 143 02/09/2023 0600   K 3.8 02/09/2023 0600   CL 105 02/09/2023 0600   CO2 30 02/09/2023 0600   GLUCOSE 126 (H) 02/09/2023 0600   BUN 97 (H) 02/09/2023 0600   CREATININE 3.13 (H) 02/09/2023 0600   CALCIUM 9.1 02/09/2023 0600   GFRNONAA 20 (L) 02/09/2023 0600   GFRAA 50 (L) 07/03/2020 0248   Estimated Creatinine Clearance: 21.1 mL/min (A) (by C-G formula based on SCr of 3.13 mg/dL (H)).  COAG Lab Results  Component Value Date   INR 1.3 (H) 01/29/2023   INR 1.3 (H) 11/20/2022    Radiology DG Wrist 2 Views Right  Result Date: 02/08/2023 CLINICAL DATA:  Right wrist pain and swelling.  No known injury EXAM: RIGHT WRIST - 2 VIEW COMPARISON:  None available. FINDINGS: No acute fracture. No dislocation. 5 mm bony density projecting in the radiocarpal joint suggestive of a loose body. 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 No  erosion. No periosteal elevation. Diffuse soft tissue swelling, most pronounced over the dorsal aspect of the hand and wrist. IMPRESSION: 1. Diffuse soft tissue swelling, most pronounced over the dorsal aspect of the hand and wrist. No acute osseous abnormality. 2. 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 Electronically Signed   By: Davina Poke D.O.   On: 02/08/2023 10:28   DG Chest Port 1 View  Result Date: 02/04/2023 CLINICAL DATA:  Acute respiratory failure with hypoxia. EXAM: PORTABLE CHEST 1 VIEW COMPARISON:  Chest radiograph February 01, 2023. FINDINGS: Endotracheal tube with tip in the midthoracic trachea 4.8 cm from the carina. Esophageal temperature probe in place. Nasogastric tube courses below the diaphragm with tip obscured by collimation. Additional cardiac monitoring leads project over the chest. Normal size heart. Aortic atherosclerosis. Apparent widening of the mediastinal vascular pedicle is favored secondary to rotation. Layering small bilateral pleural effusions with persistent right-greater-than-left airspace opacitie. No acute osseous abnormality IMPRESSION: 1. Stable layering small bilateral pleural effusions with persistent right-greater-than-left airspace opacities. Electronically Signed   By: Dahlia Bailiff M.D.   On: 02/04/2023 08:24   DG Abd 1 View  Result Date: 02/03/2023 CLINICAL DATA:  Vomiting EXAM: ABDOMEN - 1 VIEW COMPARISON:  01/30/2023 FINDINGS: Nasogastric tube noted with tip and side port in the stomach body. Gas fills the stomach. Mild scarring or atelectasis at the left lung base. A catheter projects over the left groin and extends over into the right abdomen, presumably a venous catheter. There is also a small amount of tubing projecting over the right hip. There is evidence of lumbar spondylosis and degenerative disc disease. Unremarkable bowel gas pattern. IMPRESSION: 1. Nasogastric tube tip and side port are in the stomach body. 2. Mild  scarring or atelectasis at the left lung base. 3. Unremarkable bowel gas pattern. Electronically Signed   By: Van Clines  M.D.   On: 02/03/2023 15:26   CT HEAD WO CONTRAST (5MM)  Result Date: 02/03/2023 CLINICAL DATA:  Altered mental status EXAM: CT HEAD WITHOUT CONTRAST TECHNIQUE: Contiguous axial images were obtained from the base of the skull through the vertex without intravenous contrast. RADIATION DOSE REDUCTION: This exam was performed according to the departmental dose-optimization program which includes automated exposure control, adjustment of the mA and/or kV according to patient size and/or use of iterative reconstruction technique. COMPARISON:  None Available. FINDINGS: Brain: No evidence of acute infarction, hemorrhage, mass, mass effect, or midline shift. No hydrocephalus or extra-axial fluid collection. Partial empty sella. Normal craniocervical junction. Cerebral volume is within normal limits for age. Vascular: No hyperdense vessel. Skull: Negative for fracture or focal lesion. Sinuses/Orbits: Mucosal thickening in the left greater than right maxillary sinus. Other: Underpneumatized right mastoid, with trace fluid. The left mastoid air cells are well aerated. Endotracheal and orogastric tubes noted. IMPRESSION: No acute intracranial process. Electronically Signed   By: Merilyn Baba M.D.   On: 02/03/2023 03:03   DG Chest Port 1 View  Result Date: 02/01/2023 CLINICAL DATA:  Dyspnea EXAM: PORTABLE CHEST 1 VIEW COMPARISON:  01/29/2023 FINDINGS: There is pulmonary vascular congestion. Bibasilar consolidation or volume loss. No pneumothorax. Possible small pleural effusions. Endotracheal tube tip 3 cm above carina. NG tube tip below the diaphragm and off x-ray. Mediastinal drain or probe overlies the mid thorax. IMPRESSION: Vascular congestion.  Bibasilar consolidation or volume loss. Electronically Signed   By: Sammie Bench M.D.   On: 02/01/2023 13:56   CT Angio Chest Pulmonary  Embolism (PE) W or WO Contrast  Result Date: 01/30/2023 CLINICAL DATA:  Chronic hypoxemia and hypercapnia related to COPD, congestive heart failure and chronic renal failure. EXAM: CT ANGIOGRAPHY CHEST WITH CONTRAST TECHNIQUE: Multidetector CT imaging of the chest was performed using the standard protocol during bolus administration of intravenous contrast. Multiplanar CT image reconstructions and MIPs were obtained to evaluate the vascular anatomy. RADIATION DOSE REDUCTION: This exam was performed according to the departmental dose-optimization program which includes automated exposure control, adjustment of the mA and/or kV according to patient size and/or use of iterative reconstruction technique. CONTRAST:  57m OMNIPAQUE IOHEXOL 350 MG/ML SOLN COMPARISON:  Portable chest obtained yesterday. Chest, abdomen and pelvis CT dated 01/18/2022. FINDINGS: Cardiovascular: Enlarged heart. Enlarged ascending thoracic aorta measuring 5.2 cm in diameter at the level of the main pulmonary artery. Enlarged central pulmonary arteries with a main pulmonary artery measuring 3.6 cm in diameter. Maximum aortic arch diameter of 3.2 cm. Descending thoracic aortic diameter of 3.1 cm. Normally opacified pulmonary arteries with no pulmonary arterial filling defects seen. Atheromatous calcifications, including the coronary arteries and aorta. Mediastinum/Nodes: Endotracheal tube in satisfactory position. Enteric tube extending through the esophagus with its tip in the proximal to mid stomach. No enlarged lymph nodes. Unremarkable thyroid gland. Lungs/Pleura: Small to moderate-sized right pleural effusion and small left pleural effusion. Diffuse bilateral centrilobular and some paraseptal bullous changes, most pronounced in the upper lobes. Mild bilateral lower lobe dependent atelectasis. No airspace consolidation suspicious for pneumonia. No lung nodules. Upper Abdomen: The liver has mildly nodular contours with somewhat prominent  lateral segment left lobe and caudate lobe and somewhat small right lobe. Two small probable cysts in the right lobe of the liver. Normal sized spleen. Small to moderate amount of ascites in the upper abdomen. Musculoskeletal: Minimal thoracic spine degenerative changes. Review of the MIP images confirms the above findings. IMPRESSION: 1. No pulmonary emboli. 2. Ascending  thoracic aortic aneurysm measuring 5.2 cm in maximum diameter. Recommend semi-annual imaging followup by CTA or MRA and referral to cardiothoracic surgery if not already obtained. This recommendation follows 2010 ACCF/AHA/AATS/ACR/ASA/SCA/SCAI/SIR/STS/SVM Guidelines for the Diagnosis and Management of Patients With Thoracic Aortic Disease. Circulation. 2010; [id number]. Aortic aneurysm NOS (ICD10-I71.9) 3. Enlarged central pulmonary arteries compatible with pulmonary arterial hypertension. 4. Small to moderate-sized right pleural effusion and small left pleural effusion. 5. Changes of COPD and centrilobular and paraseptal emphysema. 6. Findings suggesting cirrhosis of the liver with an associated small to moderate amount of ascites in the upper abdomen. 7.  Calcific coronary artery and aortic atherosclerosis. Aortic Atherosclerosis (ICD10-I70.0) and Emphysema (ICD10-J43.9). Electronically Signed   By: Claudie Revering M.D.   On: 01/30/2023 18:48   ECHOCARDIOGRAM COMPLETE  Result Date: 01/30/2023    ECHOCARDIOGRAM REPORT   Patient Name:   Eman Brundage Date of Exam: 01/30/2023 Medical Rec #:  [id number]      Height:       71.0 in Accession #:    [id number]     Weight:       223.3 lb Date of Birth:  07/26/1946     BSA:          2.210 m Patient Age:    39 years       BP:           163/69 mmHg Patient Gender: M              HR:           58 bpm. Exam Location:  [facility] Procedure: 2D Echo, Cardiac Doppler and Color Doppler Indications:     Elevated Troponin  History:         Patient has prior history of Echocardiogram examinations, most                   recent 10/02/2022. CHF, COPD; Risk Factors:Hypertension.                  Cardiogenic shock, ESRD.  Sonographer:     Wenda Low Referring Phys:  [id number] BRITTON L RUST-CHESTER Diagnosing Phys: Ida Rogue MD  Sonographer Comments: Suboptimal apical window and echo performed with patient supine and on artificial respirator. IMPRESSIONS  1. Left ventricular ejection fraction, by estimation, is 50 to 55%. The left ventricle has low normal function. The left ventricle has no regional wall motion abnormalities. There is mild left ventricular hypertrophy. Left ventricular diastolic parameters are consistent with Grade I diastolic dysfunction (impaired relaxation). There is the interventricular septum is flattened in systole and diastole, consistent with right ventricular pressure and volume overload.  2. Right ventricular systolic function is severely reduced. The right ventricular size is moderately enlarged. There is severely elevated pulmonary artery systolic pressure. The estimated right ventricular systolic pressure is [id number] mmHg.  3. Right atrial size was moderately dilated.  4. The mitral valve is normal in structure. Mild mitral valve regurgitation. No evidence of mitral stenosis. There is mild late systolic prolapse of both leaflets of the mitral valve.  5. Tricuspid valve regurgitation is mild to moderate.  6. The aortic valve is tricuspid. Aortic valve regurgitation is mild to moderate. No aortic stenosis is present.  7. The inferior vena cava is dilated in size with <50% respiratory variability, suggesting right atrial pressure of 15 mmHg.  8. There is mild dilatation of the aortic root, measuring 44 mm. There is mild dilatation of the ascending aorta, measuring 43 mm. FINDINGS  Left Ventricle: Left ventricular ejection fraction, by estimation, is 50 to 55%. The left ventricle has low normal function. The left ventricle has no regional wall motion abnormalities. The left ventricular internal cavity  size was normal in size. There is mild left ventricular hypertrophy. The interventricular septum is flattened in systole and diastole, consistent with right ventricular pressure and volume overload. Left ventricular diastolic parameters are consistent with Grade I diastolic dysfunction (impaired relaxation). Right Ventricle: The right ventricular size is moderately enlarged. No increase in right ventricular wall thickness. Right ventricular systolic function is severely reduced. There is severely elevated pulmonary artery systolic pressure. The tricuspid regurgitant velocity is 3.45 m/s, and with an assumed right atrial pressure of 15 mmHg, the estimated right ventricular systolic pressure is [id number] mmHg. Left Atrium: Left atrial size was normal in size. Right Atrium: Right atrial size was moderately dilated. Pericardium: There is no evidence of pericardial effusion. Mitral Valve: The mitral valve is normal in structure. There is mild late systolic prolapse of both leaflets of the mitral valve. Mild mitral valve regurgitation. No evidence of mitral valve stenosis. MV peak gradient, 3.9 mmHg. The mean mitral valve gradient is 1.0 mmHg. Tricuspid Valve: The tricuspid valve is normal in structure. Tricuspid valve regurgitation is mild to moderate. No evidence of tricuspid stenosis. Aortic Valve: The aortic valve is tricuspid. Aortic valve regurgitation is mild to moderate. No aortic stenosis is present. Aortic valve mean gradient measures 1.0 mmHg. Aortic valve peak gradient measures 3.9 mmHg. Aortic valve area, by VTI measures 3.79 cm. Pulmonic Valve: The pulmonic valve was normal in structure. Pulmonic valve regurgitation is mild to moderate. No evidence of pulmonic stenosis. Aorta: The aortic root is normal in size and structure. There is mild dilatation of the aortic root, measuring 44 mm. There is mild dilatation of the ascending aorta, measuring 43 mm. Venous: The inferior vena cava is dilated in size with less  than 50% respiratory variability, suggesting right atrial pressure of 15 mmHg. IAS/Shunts: No atrial level shunt detected by color flow Doppler.  LEFT VENTRICLE PLAX 2D LVIDd:         4.40 cm   Diastology LVIDs:         3.00 cm   LV e' medial:    6.85 cm/s LV PW:         1.30 cm   LV E/e' medial:  6.3 LV IVS:        1.30 cm   LV e' lateral:   5.98 cm/s LVOT diam:     2.10 cm   LV E/e' lateral: 7.2 LV SV:         68 LV SV Index:   31 LVOT Area:     3.46 cm  RIGHT VENTRICLE RV Basal diam:  5.10 cm RV Mid diam:    5.90 cm RV S prime:     14.50 cm/s TAPSE (M-mode): 1.8 cm LEFT ATRIUM             Index        RIGHT ATRIUM           Index LA diam:        2.90 cm 1.31 cm/m   RA Area:     27.50 cm LA Vol (A2C):   59.3 ml 26.84 ml/m  RA Volume:   101.00 ml 45.71 ml/m LA Vol (A4C):   59.1 ml 26.74 ml/m LA Biplane Vol: 59.4 ml 26.88 ml/m  AORTIC VALVE  PULMONIC VALVE AV Area (Vmax):    3.22 cm     PV Vmax:       1.00 m/s AV Area (Vmean):   4.15 cm     PV Peak grad:  4.0 mmHg AV Area (VTI):     3.79 cm AV Vmax:           98.70 cm/s AV Vmean:          45.500 cm/s AV VTI:            0.180 m AV Peak Grad:      3.9 mmHg AV Mean Grad:      1.0 mmHg LVOT Vmax:         91.80 cm/s LVOT Vmean:        54.500 cm/s LVOT VTI:          0.197 m LVOT/AV VTI ratio: 1.09  AORTA Ao Root diam: 4.40 cm Ao Asc diam:  4.30 cm MITRAL VALVE               TRICUSPID VALVE MV Area (PHT): 2.10 cm    TR Peak grad:   47.6 mmHg MV Area VTI:   2.38 cm    TR Vmax:        345.00 cm/s MV Peak grad:  3.9 mmHg MV Mean grad:  1.0 mmHg    SHUNTS MV Vmax:       0.99 m/s    Systemic VTI:  0.20 m MV Vmean:      42.3 cm/s   Systemic Diam: 2.10 cm MV Decel Time: 361 msec MV E velocity: 43.00 cm/s MV A velocity: 84.90 cm/s MV E/A ratio:  0.51 Ida Rogue MD Electronically signed by Ida Rogue MD Signature Date/Time: 01/30/2023/4:10:59 PM    Final    DG Abd Portable 1V  Result Date: 01/30/2023 CLINICAL DATA:  NGT placement EXAM:  PORTABLE ABDOMEN - 1 VIEW COMPARISON:  01/29/2023 FINDINGS: Image of the upper abdomen demonstrates a NG tube superimposed with the stomach below the diaphragm. IMPRESSION: NG tube appropriately positioned. Electronically Signed   By: Sammie Bench M.D.   On: 01/30/2023 13:12   DG Chest Port 1 View  Result Date: 01/29/2023 CLINICAL DATA:  Intubation EXAM: PORTABLE CHEST 1 VIEW COMPARISON:  01/29/2023 8:30 a.m. FINDINGS: Interval placement of endotracheal tube, tip over the midtrachea, and esophagogastric tube, tip not visualized although below the diaphragm. Cardiomegaly. Small bilateral pleural effusions. Osseous structures unremarkable. IMPRESSION: 1. Interval placement of endotracheal tube, tip over the midtrachea, and esophagogastric tube, tip not visualized although below the diaphragm. 2. Cardiomegaly. 3. Small bilateral pleural effusions. Electronically Signed   By: Delanna Ahmadi M.D.   On: 01/29/2023 17:03   DG Abd 1 View  Result Date: 01/29/2023 CLINICAL DATA:  Orogastric tube placement. EXAM: ABDOMEN - 1 VIEW COMPARISON:  None Available. FINDINGS: Distal tip of nasogastric tube is seen in expected position of gastroesophageal junction. No abnormal bowel dilatation is noted. IMPRESSION: Distal tip of nasogastric tube is seen in expected position of gastroesophageal junction. Advancement is recommended. Electronically Signed   By: Marijo Conception M.D.   On: 01/29/2023 17:02   DG Chest Portable 1 View  Result Date: 01/29/2023 CLINICAL DATA:  Shortness of breath. EXAM: PORTABLE CHEST 1 VIEW COMPARISON:  01/21/2022 FINDINGS: Leftward patient rotation. The cardio pericardial silhouette is enlarged. Vascular congestion noted with bibasilar atelectasis/infiltrate and probable layering small bilateral effusions. The visualized bony structures of the thorax are unremarkable. Telemetry leads overlie the chest.  IMPRESSION: Vascular congestion with bibasilar atelectasis/infiltrate and probable layering  small bilateral effusions. Electronically Signed   By: Misty Stanley M.D.   On: 01/29/2023 08:38      Assessment/Plan 1. ARF on CKD IV 2. Will plan for Placement of Permacath tomorrow- discussed with patient- agreeable. All questions answered. Understanding expressed. 3. Will Order Vein Mapping for AV fistula placement in the near term   We will proceed with temporary dialysis catheter placement at this time.  Risks and benefits discussed with patient and/or family, and the catheter will be placed to allow immediate initiation of dialysis.  If the patient's renal function does not improve throughout the hospital course, we will be happy to place a tunneled dialysis catheter for long term use prior to discharge.     Evaristo Bury, MD  02/09/2023 10:16 AM

## 2023-02-09 NOTE — Progress Notes (Signed)
[name]  ROUNDING NOTE   Subjective:   Mr. Wakeem Bialecki was admitted to [facility] on 01/29/2023 for Peripheral edema [R60.9] Shock circulatory ([place]) [R57.9] Acute on chronic congestive heart failure, unspecified heart failure type ([place]) [I50.9] Acute renal failure superimposed on chronic kidney disease, unspecified acute renal failure type, unspecified CKD stage ([place]) [N17.9, N18.9]  Patient transferred to 2A Seen sitting up in bed, eyes closed No family at bedside Remains on 3L [place] Lower extremity edema well managed  UOP 2.4L Creatinine 3.13 (3.04) (2.87)  Continues to be agreeable to dialysis  Objective:  Vital signs in last 24 hours:  Temp:  [97.5 F (36.4 C)-98 F (36.7 C)] 97.5 F (36.4 C) (02/25 0823) Pulse Rate:  [88-101] 88 (02/25 0823) Resp:  [20-25] 20 (02/25 0309) BP: (107-127)/(70-91) 121/91 (02/25 0823) SpO2:  [87 %-100 %] 96 % (02/25 0823) Weight:  [82.7 kg] 82.7 kg (02/24 1538)  Weight change:  Filed Weights   02/07/23 0500 02/08/23 0850 02/08/23 1538  Weight: 64.5 kg 72.1 kg 82.7 kg    Intake/Output: I/O last 3 completed shifts: In: 640 [P.O.:640] Out: 4000 [Urine:4000]   Intake/Output this shift:  Total I/O In: -  Out: 550 [Urine:550]  Physical Exam: General: Ill appearing  Head: Normocephalic, atraumatic   Eyes: Anicteric  Lungs:  Basilar rhonchi, normal effort, 3 L [place] O2  Heart: regular  Abdomen:  Soft, nontender  Extremities: + dependent edema.    Neurologic: Alert and oriented  Skin: Left lower leg wound in clean and dry dressing, bilateral SCDs  Access: none    Basic Metabolic Panel: Recent Labs  Lab 02/05/23 0411 02/06/23 0520 02/07/23 0629 02/08/23 0633 02/09/23 0600  NA 137 141 142 143 143  K 4.0 3.8 4.0 4.1 3.8  CL 104 107 108 106 105  CO2 '28 28 26 28 30  '$ GLUCOSE 153* 112* 143* 172* 126*  BUN 71* 89* 93* 96* 97*  CREATININE 2.53* 2.81* 2.87* 3.04* 3.13*  CALCIUM 9.2 9.3 9.2 9.5 9.1  MG 2.5* 2.4 2.6*  2.7* 2.5*  PHOS 3.5 3.4 3.6 4.5 4.3     Liver Function Tests: Recent Labs  Lab 02/03/23 0520 02/03/23 1735 02/05/23 0411 02/06/23 0520 02/07/23 0629 02/08/23 0633 02/09/23 0600  AST 17  --   --   --   --   --   --   ALT 10  --   --   --   --   --   --   ALKPHOS 50  --   --   --   --   --   --   BILITOT 1.6*  --   --   --   --   --   --   PROT 5.0*  --   --   --   --   --   --   ALBUMIN 2.3*  2.4*   < > 2.8* 2.6* 2.9* 2.9* 2.6*   < > = values in this interval not displayed.    No results for input(s): "LIPASE", "AMYLASE" in the last 168 hours. Recent Labs  Lab 02/03/23 0956 02/05/23 1021  AMMONIA 45* 22     CBC: Recent Labs  Lab 02/03/23 0956 02/04/23 0400 02/05/23 0411 02/06/23 0520 02/07/23 0629  WBC 14.0* 13.3* 11.9* 10.2 12.7*  NEUTROABS 11.9*  --   --  8.0* 10.2*  HGB 14.6 13.6 13.5 12.6* 13.2  HCT 46.4 43.0 42.7 40.4 41.4  MCV 103.1* 101.7* 103.4* 103.6* 102.7*  PLT 38* 38* 57* 73* 83*     Cardiac Enzymes: No results for input(s): "CKTOTAL", "CKMB", "CKMBINDEX", "TROPONINI" in the last 168 hours.  BNP: Invalid input(s): "POCBNP"  CBG: Recent Labs  Lab 02/07/23 1916 02/08/23 0006 02/08/23 0203 02/08/23 0406 02/08/23 0729  GLUCAP 126* 112* 125* 32* 134*     Microbiology: Results for orders placed or performed during the hospital encounter of 01/29/23  Culture, Respiratory w Gram Stain     Status: None   Collection Time: 01/29/23  3:45 AM   Specimen: Tracheal Aspirate; Respiratory  Result Value Ref Range Status   Specimen Description   Final    TRACHEAL ASPIRATE Performed at [facility], [address], [place], [place] [address]    Special Requests   Final    NONE Performed at [facility], [address] [place], [place] [address]    Gram Stain   Final    MODERATE WBC PRESENT,BOTH PMN AND MONONUCLEAR NO ORGANISMS SEEN    Culture   Final    RARE Normal respiratory flora-no Staph aureus or Pseudomonas  seen Performed at [place] [address] [place], [place] [address]    Report Status 02/01/2023 FINAL  Final  Culture, blood (Routine X 2) w Reflex to ID Panel     Status: None   Collection Time: 01/29/23  1:10 PM   Specimen: BLOOD  Result Value Ref Range Status   Specimen Description BLOOD BLOOD LEFT ARM  Final   Special Requests   Final    BOTTLES DRAWN AEROBIC AND ANAEROBIC Blood Culture adequate volume   Culture   Final    NO GROWTH 5 DAYS Performed at [facility], [address], [place], [place] [address]    Report Status 02/03/2023 FINAL  Final  MRSA Next Gen by PCR, Nasal     Status: None   Collection Time: 01/29/23  1:47 PM   Specimen: Nasal Mucosa; Nasal Swab  Result Value Ref Range Status   MRSA by PCR Next Gen NOT DETECTED NOT DETECTED Final    Comment: (NOTE) The GeneXpert MRSA Assay (FDA approved for NASAL specimens only), is one component of a comprehensive MRSA colonization surveillance program. It is not intended to diagnose MRSA infection nor to guide or monitor treatment for MRSA infections. Test performance is not FDA approved in patients less than 73 years old. Performed at [facility], [address] [place], [place] [address]   Culture, blood (Routine X 2) w Reflex to ID Panel     Status: None   Collection Time: 01/29/23  2:10 PM   Specimen: BLOOD  Result Value Ref Range Status   Specimen Description BLOOD A-LINE  Final   Special Requests   Final    BOTTLES DRAWN AEROBIC AND ANAEROBIC Blood Culture adequate volume   Culture   Final    NO GROWTH 5 DAYS Performed at [facility], [address] [place], [place] [address]    Report Status 02/03/2023 FINAL  Final    Coagulation Studies: No results for input(s): "LABPROT", "INR" in the last 72 hours.   Urinalysis: No results for input(s): "COLORURINE", "LABSPEC", "PHURINE", "GLUCOSEU", "HGBUR", "BILIRUBINUR", "KETONESUR", "PROTEINUR", "UROBILINOGEN",  "NITRITE", "LEUKOCYTESUR" in the last 72 hours.  Invalid input(s): "APPERANCEUR"     Imaging: DG Wrist 2 Views Right  Result Date: 02/08/2023 CLINICAL DATA:  Right wrist pain and swelling.  No known injury EXAM: RIGHT WRIST - 2 VIEW COMPARISON:  None available.  FINDINGS: No acute fracture. No dislocation. 5 mm bony density projecting in the radiocarpal joint suggestive of a loose body. 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 No erosion. No periosteal elevation. Diffuse soft tissue swelling, most pronounced over the dorsal aspect of the hand and wrist. IMPRESSION: 1. Diffuse soft tissue swelling, most pronounced over the dorsal aspect of the hand and wrist. No acute osseous abnormality. 2. 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 Electronically Signed   By: Davina Poke D.O.   On: 02/08/2023 10:28     Medications:      ascorbic acid  500 mg Oral BID   aspirin  81 mg Oral Daily   Chlorhexidine Gluconate Cloth  6 each Topical Daily   docusate sodium  100 mg Oral BID   feeding supplement  237 mL Oral TID BM   folic acid  1 mg Oral Daily   heparin injection (subcutaneous)  5,000 Units Subcutaneous Q8H   metoprolol tartrate  12.5 mg Oral BID   multivitamin with minerals  1 tablet Oral Daily   thiamine  100 mg Oral Daily   diclofenac Sodium, docusate sodium, mouth rinse, polyethylene glycol  Assessment/ Plan:  Mr. Jemaine Zisk is a 77 y.o. black male with chronic diastolic congestive heart failure, hypertension, COPD, hyperlipidemia, who is admitted to [facility] on 01/29/2023 for Peripheral edema [R60.9] Shock circulatory ([place]) [R57.9] Acute on chronic congestive heart failure, unspecified heart failure type ([place]) [I50.9] Acute renal failure superimposed on chronic kidney disease, unspecified acute renal failure type, unspecified CKD stage ([place]) [N17.9, N18.9]  Acute Kidney Injury on chronic kidney disease stage IV: baseline creatinine of 2.67, GFR  of 24 on 01/06/23. History of bland urine. Acute kidney injury most likely secondary to cardiorenal syndrome. Chronic kidney disease secondary to hypertension. Required CRRT from 2/16 - 2/20. Intermitted hemodialysis on 2/21 with hypotension and thrombosis. Did not tolerate treatment. The next day, patient said he did not want dialysis and his dialysis catheter was removed. However on 2/23, patient says he is open to dialysis if necessary.  Creatinine continues to slowly rise, continues to make adequate urine with Furosemide. BUN remains elevated. Discussed the life long possibility of dialysis with patient and he is agreeable to proceed. Will consult vascular for permcath placement Monday. Will reinitiate dialysis once placed and seek outpatient dialysis clinic.  Diuretics as needed, IV furosemide given yesterday.  Continue to monitor volume status, urine output, renal function and serum electrolytes.   Acute exacerbation of chronic diastolic congestive heart failure and acute respiratory failure requiring intubation and mechanical ventilation.  Extubated on 02/05/23. Home regimen was torsemide '60mg'$  daily and metolazone 2.'5mg'$  twice a week.   Received IV furosemide yesterday with decent urine output. Weaned to 3L [place].   Hypertension: with hypotension with cardiogenic shock required vasopressors on admission. 123/82 - currently on metoprolol for rate control and blood pressure.   Blood pressure 121/91 today.    LOS: [place] 2/25/202411:21 AM

## 2023-02-09 NOTE — Progress Notes (Signed)
Speech Language Pathology Treatment:    Patient Details Name: John [name] MRN: [id number] DOB: 1946/07/13 Today's Date: 02/09/2023 Time: 1215-1225 SLP Time Calculation (min) (ACUTE ONLY): 10 min  Assessment / Plan / Recommendation Clinical Impression  Pt seen for diet tolerance and trials of upgraded textures. Pt alert. Encouragement needed for participation. Denies difficulty consuming a pureed diet with thin liquids; however, endorsed disliking pureed food. Pt on 3L/min O2 via [place]. Mild dyspnea noted at rest which pt endorses is his baseline.  Offered pt trials of solid, pureed, and thin liquids (via straw). Despite encouragement and presentatin of a variety of solids, pt declined solid trials. Pt asking for SLP to attempt solids with him "tomorrow." Pt observed consuming x2 bites of pureed and approx 3 oz of water via single straw sips. No overt s/sx pharyngeal dysphagia noted.   Given limited nature of today's treatment due to pt's self-limiting behaviors, recommend continuation of a pureed diet with thin liquids and safe swallowing strategies/aspiration precautions as outlined below.   SLP to f/u per POC for diet tolerance and clinical swallowing re-evaluation for trials of upgraded textures.    HPI HPI: Per H&P, pt is 77 year old male with a history of essential hypertension, on hydralazine and Cozaar, stage IV CKD but has not been on dialysis in the past, has chronic diastolic and systolic heart failure with cor pulmonale, moderate tricuspid regurgitation and aortic valve regurgitation, history of a ascending aortic aneurysm, coronary artery disease with atherosclerosis, COPD and chronic hypoxemia baseline 2 L/min supplemental oxygen at home with recurrent episodes of hypoxemia and remote acute exacerbation of COPD with hypoxemia and hypercapnia 2 months ago history of ruptured left quadricep tendon 2 years ago, chronic thrombocytopenia and erythrocytosis recurrent lower extremity  cellulitis, recurrent hyperkalemia, morbid obesity history of anasarca dyslipidemia, recent admission for acute CHF exacerbation requiring Lasix drip and BiPAP support, who was brought in by EMS due to worsening altered mental status and circulatory shock.  I was able to meet with wife at bedside during my evaluation who shares patient has been sleeping majority of each day over the last week has been on arousable and received phone call from medical provider regarding worsening kidney function which prompted ER evaluation.  He had chest x-ray performed while in the ER showing bilateral pleural effusions with vascular congestion and atelectasis/infiltrate.  Patient in circulatory shock with Levophed support on admission in ER.  PCCM consultation for admission to medical intensive care unit with circulatory shock consistent with acute on chronic renal failure with CHF exacerbation. Pt inubated on 2/14 and extubated on 2/21.      SLP Plan  Continue with current plan of care      Recommendations for follow up therapy are one component of a multi-disciplinary discharge planning process, led by the attending physician.  Recommendations may be updated based on patient status, additional functional criteria and insurance authorization.    Recommendations  Diet recommendations: Dysphagia 1 (puree);Thin liquid Liquids provided via: Cup;Straw Medication Administration: Crushed with puree Supervision: Staff to assist with self feeding;Full supervision/cueing for compensatory strategies Compensations: Minimize environmental distractions;Slow rate;Small sips/bites;Lingual sweep for clearance of pocketing;Follow solids with liquid Postural Changes and/or Swallow Maneuvers: Out of bed for meals;Seated upright 90 degrees;Upright 30-60 min after meal                General recommendations:  (RD f/u) Oral Care Recommendations: Oral care BID;Oral care before and after PO;Staff/trained caregiver to provide oral  care Follow Up Recommendations: Skilled nursing-short  term rehab (<3 hours/day) Assistance recommended at discharge: Frequent or constant Supervision/Assistance SLP Visit Diagnosis: Dysphagia, oral phase (R13.11) Plan: Continue with current plan of care          Cherrie Gauze, M.S., [facility] [phone] [place])  Quintella Baton  02/09/2023, 12:43 PM

## 2023-02-09 NOTE — Progress Notes (Signed)
PROGRESS NOTE    John [name]  [id number] DOB: 08/31/1946 DOA: 01/29/2023 PCP: Alisa Graff, FNP    Brief Narrative:  77 year old male with a history of essential hypertension, on hydralazine and Cozaar, stage IV CKD but has not been on dialysis in the past, has chronic diastolic and systolic heart failure with cor pulmonale, moderate tricuspid regurgitation and aortic valve regurgitation, history of a ascending aortic aneurysm, coronary artery disease with atherosclerosis, COPD and chronic hypoxemia baseline 2 L/min supplemental oxygen at home with recurrent episodes of hypoxemia and remote acute exacerbation of COPD with hypoxemia and hypercapnia 2 months ago history of ruptured left quadricep tendon 2 years ago, chronic thrombocytopenia and erythrocytosis recurrent lower extremity cellulitis, recurrent hyperkalemia, morbid obesity history of anasarca dyslipidemia, recent admission for acute CHF exacerbation requiring Lasix drip and BiPAP support, who was brought in by EMS due to worsening altered mental status and circulatory shock.  01/30/23- I met with girlfriend of patient today to review short term medical plan.  He had Echo whith RV dysfunction and concern for PE.  We have started CRRT yesterday with additional interval improvement. He will have CTPE today.  He is weaned down on pressors today on vasor 0.4 and 4 of levophed reduced from 36 this morning.  01/31/23- remains agitated intermittently overnight. Continued issues with clot burden through CRRT circuit. 02/01/23- remains agitated overnight. Requiring numerous ativan pushes. Attempting to wean down sedation. Failed SBT miserably. 02/02/23- issues with CRRT overnight. Will attempt higher UF today. Assess for possible chest tube placement.  02/03/23- No issues with CRRT overnight, currently running without complication.  CT Head obtained overnight and negative for acute process.  Off Precedex this morning, currently somnolent, mental  status currently precluding extubation. Will perform SBT as mental status permits.  Stop Cefepime, checking ammonia. 02/04/23- Remains on CRRT. Pt more awake and tracking.  Tolerated SBT for 12 hrs  02/05/23- Pt successfully extubated 02/06/23-Off vasopressors.  Palliative Care consulted pt stating he DOES NOT want any form of dialysis.  If pt remains stable will transfer service to [facility] 2/23-care assumed by [facility] hospitalist service. 2/24-after discussion between palliative care, patient, patient's girlfriend the decision was made that the patient would remain DNR status however was interested in restarting hemodialysis.  Nephrology aware.  Kidney function continues to deteriorate. 2/25-repeated discussions with the patient.  He still seems somewhat unsure about dialysis however at this point is agreeing to move forward with PermCath placement.  Vascular surgery made aware.  Patient consented for surgery 2/26.  Assessment & Plan:   Principal Problem:   Shock circulatory ([place]) Active Problems:   Acute on chronic congestive heart failure (HCC)   Acute hypoxic respiratory failure (HCC)   Toxic metabolic encephalopathy  Circulatory shock  Acute decompensated HFpEF and right-sided heart failure n RV dysfunction and dilation on echocardiography, concerning for RV failure and pulmonary hypertension. Porto-pulmonary hypertension on the differential given history of liver cirrhosis. Was on CVVH for ultra-filtration for volume removal, now refusing HD. goal MAP > 65. Plan: Patient agreeable to restarting hemodialysis now.  Cardiology has signed off.  No further diagnostics from their standpoint.  Acute Hypoxic Respiratory Failure Successfully extubated to nasal cannula on 2/22.  Doing well overall.  Remains on nasal cannula at 3 to 4 L. Monitor oxygen and continue to wean as tolerated Lasix challenge 100 mg IV x 1 on 2/24 Patient on 3 L 2/25.  Will hold further Lasix at this time  Toxic Metabolic  Encephalopathy Extubated successfully.  Mental status approaching baseline.  Mental status and in the setting of renal failure, psychotropic medications.  Acute kidney injury on chronic kidney disease stage IV Was on CRRT given hypotension.  Now agreeable to restart hemodialysis.  Kidney function continues to deteriorate.  If dialysis indicated will contact vascular surgery for PermCath placement Monday 2/26.  Vascular surgery on consult.  Tentative plan for PermCath placement 2/26  Hepatic cirrhosis Thrombocytopenia No acute issues.  Does confer a poor prognosis.  Was on cefepime empirically given leukocytosis.  Cultures remain negative.  Finished 5 days of antibiotics.  Now discontinued.  DVT prophylaxis: SCD Code Status: DNR Family Communication: None today Disposition Plan: Status is: Inpatient Remains inpatient appropriate because: Resolving respiratory failure and encephalopathy.  Patient now agreeable to restarting hemodialysis.  Remains DNR.  Tentative plan for PermCath placement 2/26  Level of care: Progressive  Consultants:  Cardiology Palliative care   Procedures:  femoral dialysis catheter, now removed  Antimicrobials:   Subjective: Seen.  Ill-appearing.  Answers questions appropriately.  Objective: Vitals:   02/08/23 1550 02/08/23 1955 02/09/23 0309 02/09/23 0823  BP: 107/70 127/77 111/74 (!) 121/91  Pulse: (!) 101 (!) 101 91 88  Resp: '20 20 20   '$ Temp: 98 F (36.7 C) 98 F (36.7 C) 98 F (36.7 C) (!) 97.5 F (36.4 C)  TempSrc: Oral Oral Oral Oral  SpO2: (!) 87% 91% 94% 96%  Weight:      Height:        Intake/Output Summary (Last 24 hours) at 02/09/2023 1106 Last data filed at 02/09/2023 [id number] Gross per 24 hour  Intake --  Output 2950 ml  Net -2950 ml   Filed Weights   02/07/23 0500 02/08/23 0850 02/08/23 1538  Weight: 64.5 kg 72.1 kg 82.7 kg    Examination:  General exam: NAD.  Frail Respiratory system: Bilateral crackles.  Normal work of  breathing.  3 L Cardiovascular system: Tachycardic, regular rhythm, no murmurs Gastrointestinal system: Soft, NT/ND, normal bowel sounds Central nervous system: Alert and oriented. No focal neurological deficits. Extremities: Decreased by about lateral lower extremities Skin: Right mid back wound Psychiatry: Judgement and insight appear normal. Mood & affect appropriate.     Data Reviewed: I have personally reviewed following labs and imaging studies  CBC: Recent Labs  Lab 02/03/23 0956 02/04/23 0400 02/05/23 0411 02/06/23 0520 02/07/23 0629  WBC 14.0* 13.3* 11.9* 10.2 12.7*  NEUTROABS 11.9*  --   --  8.0* 10.2*  HGB 14.6 13.6 13.5 12.6* 13.2  HCT 46.4 43.0 42.7 40.4 41.4  MCV 103.1* 101.7* 103.4* 103.6* 102.7*  PLT 38* 38* 57* 73* 83*   Basic Metabolic Panel: Recent Labs  Lab 02/05/23 0411 02/06/23 0520 02/07/23 0629 02/08/23 0633 02/09/23 0600  NA 137 141 142 143 143  K 4.0 3.8 4.0 4.1 3.8  CL 104 107 108 106 105  CO2 '28 28 26 28 30  '$ GLUCOSE 153* 112* 143* 172* 126*  BUN 71* 89* 93* 96* 97*  CREATININE 2.53* 2.81* 2.87* 3.04* 3.13*  CALCIUM 9.2 9.3 9.2 9.5 9.1  MG 2.5* 2.4 2.6* 2.7* 2.5*  PHOS 3.5 3.4 3.6 4.5 4.3   GFR: Estimated Creatinine Clearance: 21.1 mL/min (A) (by C-G formula based on SCr of 3.13 mg/dL (H)). Liver Function Tests: Recent Labs  Lab 02/03/23 0520 02/03/23 1735 02/05/23 0411 02/06/23 0520 02/07/23 0629 02/08/23 [id number] 02/09/23 0600  AST 17  --   --   --   --   --   --  ALT 10  --   --   --   --   --   --   ALKPHOS 50  --   --   --   --   --   --   BILITOT 1.6*  --   --   --   --   --   --   PROT 5.0*  --   --   --   --   --   --   ALBUMIN 2.3*  2.4*   < > 2.8* 2.6* 2.9* 2.9* 2.6*   < > = values in this interval not displayed.   No results for input(s): "LIPASE", "AMYLASE" in the last 168 hours. Recent Labs  Lab 02/03/23 0956 02/05/23 1021  AMMONIA 45* 22   Coagulation Profile: No results for input(s): "INR", "PROTIME" in  the last 168 hours. Cardiac Enzymes: No results for input(s): "CKTOTAL", "CKMB", "CKMBINDEX", "TROPONINI" in the last 168 hours. BNP (last 3 results) No results for input(s): "PROBNP" in the last 8760 hours. HbA1C: No results for input(s): "HGBA1C" in the last 72 hours. CBG: Recent Labs  Lab 02/07/23 1916 02/08/23 0006 02/08/23 0203 02/08/23 0406 02/08/23 0729  GLUCAP 126* 112* 125* 124* 134*   Lipid Profile: No results for input(s): "CHOL", "HDL", "LDLCALC", "TRIG", "CHOLHDL", "LDLDIRECT" in the last 72 hours. Thyroid Function Tests: No results for input(s): "TSH", "T4TOTAL", "FREET4", "T3FREE", "THYROIDAB" in the last 72 hours. Anemia Panel: No results for input(s): "VITAMINB12", "FOLATE", "FERRITIN", "TIBC", "IRON", "RETICCTPCT" in the last 72 hours. Sepsis Labs: No results for input(s): "PROCALCITON", "LATICACIDVEN" in the last 168 hours.  No results found for this or any previous visit (from the past 240 hour(s)).        Radiology Studies: DG Wrist 2 Views Right  Result Date: 02/08/2023 CLINICAL DATA:  Right wrist pain and swelling.  No known injury EXAM: RIGHT WRIST - 2 VIEW COMPARISON:  None available. FINDINGS: No acute fracture. No dislocation. 5 mm bony density projecting in the radiocarpal joint suggestive of a loose body. 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 No erosion. No periosteal elevation. Diffuse soft tissue swelling, most pronounced over the dorsal aspect of the hand and wrist. IMPRESSION: 1. Diffuse soft tissue swelling, most pronounced over the dorsal aspect of the hand and wrist. No acute osseous abnormality. 2. 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 Electronically Signed   By: Davina Poke D.O.   On: 02/08/2023 10:28        Scheduled Meds:  ascorbic acid  500 mg Oral BID   aspirin  81 mg Oral Daily   Chlorhexidine Gluconate Cloth  6 each Topical Daily   docusate sodium  100 mg Oral BID   feeding  supplement  237 mL Oral TID BM   folic acid  1 mg Oral Daily   heparin injection (subcutaneous)  5,000 Units Subcutaneous Q8H   metoprolol tartrate  12.5 mg Oral BID   multivitamin with minerals  1 tablet Oral Daily   thiamine  100 mg Oral Daily   Continuous Infusions:   LOS: 11 days    Sidney Ace, MD [facility]   If 7PM-7AM, please contact night-coverage  02/09/2023, 11:06 AM

## 2023-02-10 ENCOUNTER — Encounter: Payer: Self-pay | Admitting: Pulmonary Disease

## 2023-02-10 ENCOUNTER — Encounter
Admission: EM | Disposition: A | Discharge status: Skilled Nursing Facility | Payer: Self-pay | Source: Home / Self Care | Attending: Internal Medicine

## 2023-02-10 DIAGNOSIS — N186 End stage renal disease: Secondary | ICD-10-CM

## 2023-02-10 DIAGNOSIS — R579 Shock, unspecified: Secondary | ICD-10-CM | POA: Diagnosis not present

## 2023-02-10 DIAGNOSIS — N179 Acute kidney failure, unspecified: Secondary | ICD-10-CM | POA: Diagnosis not present

## 2023-02-10 DIAGNOSIS — G928 Other toxic encephalopathy: Secondary | ICD-10-CM | POA: Diagnosis not present

## 2023-02-10 DIAGNOSIS — I509 Heart failure, unspecified: Secondary | ICD-10-CM | POA: Diagnosis not present

## 2023-02-10 DIAGNOSIS — Z992 Dependence on renal dialysis: Secondary | ICD-10-CM

## 2023-02-10 HISTORY — PX: DIALYSIS/PERMA CATHETER INSERTION: CATH118288

## 2023-02-10 LAB — RENAL FUNCTION PANEL
Albumin: 2.5 g/dL — ABNORMAL LOW (ref 3.5–5.0)
Anion gap: 9 (ref 5–15)
BUN: 94 mg/dL — ABNORMAL HIGH (ref 8–23)
CO2: 29 mmol/L (ref 22–32)
Calcium: 9.4 mg/dL (ref 8.9–10.3)
Chloride: 104 mmol/L (ref 98–111)
Creatinine, Ser: 3.02 mg/dL — ABNORMAL HIGH (ref 0.61–1.24)
GFR, Estimated: 21 mL/min — ABNORMAL LOW (ref >60)
Glucose, Bld: 124 mg/dL — ABNORMAL HIGH (ref 70–99)
Phosphorus: 3.8 mg/dL (ref 2.5–4.6)
Potassium: 4 mmol/L (ref 3.5–5.1)
Sodium: 142 mmol/L (ref 135–145)

## 2023-02-10 LAB — MAGNESIUM: Magnesium: 2.7 mg/dL — ABNORMAL HIGH (ref 1.7–2.4)

## 2023-02-10 LAB — HEPATITIS B CORE ANTIBODY, TOTAL: Hep B Core Total Ab: NONREACTIVE

## 2023-02-10 SURGERY — DIALYSIS/PERMA CATHETER INSERTION
Anesthesia: Choice | Laterality: Right

## 2023-02-10 MED ORDER — LIDOCAINE HCL (PF) 1 % IJ SOLN: 5.0000 mL | INTRAMUSCULAR | Status: DC | PRN

## 2023-02-10 MED ORDER — FENTANYL CITRATE (PF) 100 MCG/2ML IJ SOLN
INTRAMUSCULAR | Status: DC | PRN
  Administered 2023-02-10: 25 ug via INTRAVENOUS

## 2023-02-10 MED ORDER — ANTICOAGULANT SODIUM CITRATE 4% (200MG/5ML) IV SOLN: 5.0000 mL | Status: DC | PRN

## 2023-02-10 MED ORDER — ORAL CARE MOUTH RINSE
15.0000 mL | OROMUCOSAL | Status: DC
  Administered 2023-02-10 – 2023-02-18 (×14): 15 mL via OROMUCOSAL

## 2023-02-10 MED ORDER — ALTEPLASE 2 MG IJ SOLR: 2.0000 mg | Freq: Once | INTRAMUSCULAR | Status: DC | PRN

## 2023-02-10 MED ORDER — HEPARIN SODIUM (PORCINE) 1000 UNIT/ML DIALYSIS: 1000.0000 [IU] | INTRAMUSCULAR | Status: DC | PRN

## 2023-02-10 MED ORDER — MIDAZOLAM HCL 2 MG/2ML IJ SOLN
INTRAMUSCULAR | Status: AC
  Filled 2023-02-10: qty 2

## 2023-02-10 MED ORDER — CEFAZOLIN SODIUM-DEXTROSE 1-4 GM/50ML-% IV SOLN: 1.0000 g | INTRAVENOUS | Status: AC

## 2023-02-10 MED ORDER — CEFAZOLIN SODIUM-DEXTROSE 1-4 GM/50ML-% IV SOLN
INTRAVENOUS | Status: AC
  Administered 2023-02-10: 1 g
  Filled 2023-02-10: qty 50

## 2023-02-10 MED ORDER — HEPARIN SODIUM (PORCINE) 10000 UNIT/ML IJ SOLN
INTRAMUSCULAR | Status: AC
  Filled 2023-02-10: qty 1

## 2023-02-10 MED ORDER — SODIUM CHLORIDE 0.9 % IV SOLN: INTRAVENOUS | Status: DC

## 2023-02-10 MED ORDER — CHLORHEXIDINE GLUCONATE CLOTH 2 % EX PADS
6.0000 | MEDICATED_PAD | Freq: Every day | CUTANEOUS | Status: DC
  Administered 2023-02-10 – 2023-02-20 (×12): 6 via TOPICAL

## 2023-02-10 MED ORDER — MIDAZOLAM HCL 2 MG/2ML IJ SOLN
INTRAMUSCULAR | Status: DC | PRN
  Administered 2023-02-10: 1 mg via INTRAVENOUS

## 2023-02-10 MED ORDER — ORAL CARE MOUTH RINSE: 15.0000 mL | OROMUCOSAL | Status: DC | PRN

## 2023-02-10 MED ORDER — FENTANYL CITRATE PF 50 MCG/ML IJ SOSY
PREFILLED_SYRINGE | INTRAMUSCULAR | Status: AC
  Filled 2023-02-10: qty 1

## 2023-02-10 MED ORDER — SODIUM CHLORIDE 0.9 % IV SOLN
INTRAVENOUS | Status: DC | PRN
  Administered 2023-02-10: 10 mL/h via INTRAVENOUS

## 2023-02-10 MED ORDER — PENTAFLUOROPROP-TETRAFLUOROETH EX AERO: 1.0000 | INHALATION_SPRAY | CUTANEOUS | Status: DC | PRN

## 2023-02-10 MED ORDER — LIDOCAINE-PRILOCAINE 2.5-2.5 % EX CREA: 1.0000 | TOPICAL_CREAM | CUTANEOUS | Status: DC | PRN

## 2023-02-10 SURGICAL SUPPLY — 7 items
ADH SKN CLS APL DERMABOND .7 (GAUZE/BANDAGES/DRESSINGS) ×1
CATH PALINDROME-P 19CM W/VT (CATHETERS) IMPLANT
COVER PROBE ULTRASOUND 5X96 (MISCELLANEOUS) IMPLANT
DERMABOND ADVANCED .7 DNX12 (GAUZE/BANDAGES/DRESSINGS) IMPLANT
PACK ANGIOGRAPHY (CUSTOM PROCEDURE TRAY) IMPLANT
SUT MNCRL AB 4-0 PS2 18 (SUTURE) IMPLANT
SUT PROLENE 0 CT 1 30 (SUTURE) IMPLANT

## 2023-02-10 NOTE — Interval H&P Note (Signed)
History and Physical Interval Note:  02/10/2023 8:33 AM  John [name]  has presented today for surgery, with the diagnosis of CKD IV.  The various methods of treatment have been discussed with the patient and family. After consideration of risks, benefits and other options for treatment, the patient has consented to  Procedure(s): DIALYSIS/PERMA CATHETER INSERTION (Right) as a surgical intervention.  The patient's history has been reviewed, patient examined, no change in status, stable for surgery.  I have reviewed the patient's chart and labs.  Questions were answered to the patient's satisfaction.   Patient has questionable ability to consent to procedure, but it is clearly medically necessary at this point and will ask nephrology to weigh in as well and if they agree it is medically necessary as well, will proceed today.  Leotis Pain

## 2023-02-10 NOTE — Progress Notes (Signed)
[name]  ROUNDING NOTE   Subjective:   [name] was admitted to [facility] on 01/29/2023 for Peripheral edema [R60.9] Shock circulatory ([place]) [R57.9] Acute on chronic congestive heart failure, unspecified heart failure type ([place]) [I50.9] Acute renal failure superimposed on chronic kidney disease, unspecified acute renal failure type, unspecified CKD stage ([place]) [N17.9, N18.9]  Patient seen in special procedure Alert and oriented to name only Npo for permcath placement.   UOP 2025m Creatinine 3.02 (3.13) (3.04) (2.87)   Objective:  Vital signs in last 24 hours:  Temp:  [97.6 F (36.4 C)-98.1 F (36.7 C)] 97.6 F (36.4 C) (02/26 0833) Pulse Rate:  [87-105] 89 (02/26 1015) Resp:  [14-22] 18 (02/26 1015) BP: (81-119)/(41-83) 102/65 (02/26 1015) SpO2:  [88 %-100 %] 93 % (02/26 1015)  Weight change:  Filed Weights   02/07/23 0500 02/08/23 0850 02/08/23 1538  Weight: 64.5 kg 72.1 kg 82.7 kg    Intake/Output: I/O last 3 completed shifts: In: [id number][P.O.:565] Out: 3405 [[id number]  Intake/Output this shift:  No intake/output data recorded.  Physical Exam: General: Ill appearing  Head: Normocephalic, atraumatic   Eyes: Anicteric  Lungs:  Basilar rhonchi, normal effort, 3 L [place] O2  Heart: regular  Abdomen:  Soft, nontender  Extremities: + dependent edema.    Neurologic: Alert and oriented  Skin: Left lower leg wound in clean and dry dressing, bilateral SCDs  Access: none    Basic Metabolic Panel: Recent Labs  Lab 02/06/23 0520 02/07/23 0629 02/08/23 0633 02/09/23 0600 02/10/23 0619  NA 141 142 143 143 142  K 3.8 4.0 4.1 3.8 4.0  CL 107 108 106 105 104  CO2 '28 26 28 30 29  '$ GLUCOSE 112* 143* 172* 126* 124*  BUN 89* 93* 96* 97* 94*  CREATININE 2.81* 2.87* 3.04* 3.13* 3.02*  CALCIUM 9.3 9.2 9.5 9.1 9.4  MG 2.4 2.6* 2.7* 2.5* 2.7*  PHOS 3.4 3.6 4.5 4.3 3.8     Liver Function Tests: Recent Labs  Lab 02/06/23 0520 02/07/23 0629  02/08/23 0633 02/09/23 0600 02/10/23 0619  ALBUMIN 2.6* 2.9* 2.9* 2.6* 2.5*    No results for input(s): "LIPASE", "AMYLASE" in the last 168 hours. Recent Labs  Lab 02/05/23 1021  AMMONIA 22     CBC: Recent Labs  Lab 02/04/23 0400 02/05/23 0411 02/06/23 0520 02/07/23 0629  WBC 13.3* 11.9* 10.2 12.7*  NEUTROABS  --   --  8.0* 10.2*  HGB 13.6 13.5 12.6* 13.2  HCT 43.0 42.7 40.4 41.4  MCV 101.7* 103.4* 103.6* 102.7*  PLT 38* 57* 73* 83*     Cardiac Enzymes: No results for input(s): "CKTOTAL", "CKMB", "CKMBINDEX", "TROPONINI" in the last 168 hours.  BNP: Invalid input(s): "POCBNP"  CBG: Recent Labs  Lab 02/07/23 1916 02/08/23 0006 02/08/23 0203 02/08/23 0406 02/08/23 0729  GLUCAP 126* 112* 125* 157 134*     Microbiology: Results for orders placed or performed during the hospital encounter of 01/29/23  Culture, Respiratory w Gram Stain     Status: None   Collection Time: 01/29/23  3:45 AM   Specimen: Tracheal Aspirate; Respiratory  Result Value Ref Range Status   Specimen Description   Final    TRACHEAL ASPIRATE Performed at [facility] [address] [place] [address]   Special Requests   Final    NONE Performed at [facility] [address] [place] [address]   Gram Stain   Final    MODERATE  WBC PRESENT,BOTH PMN AND MONONUCLEAR NO ORGANISMS SEEN    Culture   Final    RARE Normal respiratory flora-no Staph aureus or Pseudomonas seen Performed at [facility] [address] [place], [place] [address]    Report Status 02/01/2023 FINAL  Final  Culture, blood (Routine X 2) w Reflex to ID Panel     Status: None   Collection Time: 01/29/23  1:10 PM   Specimen: BLOOD  Result Value Ref Range Status   Specimen Description BLOOD BLOOD LEFT ARM  Final   Special Requests   Final    BOTTLES DRAWN AEROBIC AND ANAEROBIC Blood Culture adequate volume   Culture   Final    NO GROWTH 5 DAYS Performed at  [facility], [address] [place], [place] [address]    Report Status 02/03/2023 FINAL  Final  MRSA Next Gen by PCR, Nasal     Status: None   Collection Time: 01/29/23  1:47 PM   Specimen: Nasal Mucosa; Nasal Swab  Result Value Ref Range Status   MRSA by PCR Next Gen NOT DETECTED NOT DETECTED Final    Comment: (NOTE) The GeneXpert MRSA Assay (FDA approved for NASAL specimens only), is one component of a comprehensive MRSA colonization surveillance program. It is not intended to diagnose MRSA infection nor to guide or monitor treatment for MRSA infections. Test performance is not FDA approved in patients less than 59 years old. Performed at [facility], [address] [place], [place] [address]   Culture, blood (Routine X 2) w Reflex to ID Panel     Status: None   Collection Time: 01/29/23  2:10 PM   Specimen: BLOOD  Result Value Ref Range Status   Specimen Description BLOOD A-LINE  Final   Special Requests   Final    BOTTLES DRAWN AEROBIC AND ANAEROBIC Blood Culture adequate volume   Culture   Final    NO GROWTH 5 DAYS Performed at [facility], [address] [place],  [address]    Report Status 02/03/2023 FINAL  Final    Coagulation Studies: No results for input(s): "LABPROT", "INR" in the last 72 hours.   Urinalysis: No results for input(s): "COLORURINE", "LABSPEC", "PHURINE", "GLUCOSEU", "HGBUR", "BILIRUBINUR", "KETONESUR", "PROTEINUR", "UROBILINOGEN", "NITRITE", "LEUKOCYTESUR" in the last 72 hours.  Invalid input(s): "APPERANCEUR"     Imaging: PERIPHERAL VASCULAR CATHETERIZATION  Result Date: 02/10/2023 See surgical note for result.    Medications:    sodium chloride 10 mL/hr at 02/10/23 [id number]   anticoagulant sodium citrate       ascorbic acid  500 mg Oral BID   aspirin  81 mg Oral Daily   Chlorhexidine Gluconate Cloth  6 each Topical Daily   Chlorhexidine Gluconate Cloth  6 each Topical Q0600   docusate sodium   100 mg Oral BID   feeding supplement  237 mL Oral TID BM   fentaNYL       folic acid  1 mg Oral Daily   heparin       heparin injection (subcutaneous)  5,000 Units Subcutaneous Q8H   metoprolol tartrate  12.5 mg Oral BID   midazolam       multivitamin with minerals  1 tablet Oral Daily   mouth rinse  15 mL Mouth Rinse 4 times per day   thiamine  100 mg Oral Daily   alteplase, anticoagulant sodium citrate, diclofenac Sodium, docusate sodium, fentaNYL, heparin, heparin, lidocaine (PF), lidocaine-prilocaine, midazolam, mouth rinse, mouth rinse, pentafluoroprop-tetrafluoroeth,  polyethylene glycol  Assessment/ Plan:  [name] is a 77 y.o. black male with chronic diastolic congestive heart failure, hypertension, COPD, hyperlipidemia, who is admitted to [facility] on 01/29/2023 for Peripheral edema [R60.9] Shock circulatory ([place]) [R57.9] Acute on chronic congestive heart failure, unspecified heart failure type ([place]) [I50.9] Acute renal failure superimposed on chronic kidney disease, unspecified acute renal failure type, unspecified CKD stage ([place]) [N17.9, N18.9]  Acute Kidney Injury on chronic kidney disease stage IV: baseline creatinine of 2.67, GFR of 24 on 01/06/23. History of bland urine. Acute kidney injury most likely secondary to cardiorenal syndrome. Chronic kidney disease secondary to hypertension. Required CRRT from 2/16 - 2/20. Intermitted hemodialysis on 2/21 with hypotension and thrombosis. Did not tolerate treatment. The next day, patient said he did not want dialysis and his dialysis catheter was removed. However on 2/23, patient says he is open to dialysis if necessary.  Creatinine stable today with good urine output noted.  Will proceed with permcath placement due to uremic symptoms. Emergency consent given to place permcath and patient will receive first treatment later today. Renal navigator will begin outpatient clinic search. Diuretics as needed Continue to monitor volume  status, urine output, renal function and serum electrolytes.   Acute exacerbation of chronic diastolic congestive heart failure and acute respiratory failure requiring intubation and mechanical ventilation.  Extubated on 02/05/23. Home regimen was torsemide '60mg'$  daily and metolazone 2.'5mg'$  twice a week.   Remains on 3L .   Hypertension: with hypotension with cardiogenic shock required vasopressors on admission. 123/82 - currently on metoprolol for rate control and blood pressure.   Blood pressure 102/65   LOS: 12 John [name] 2/26/202411:41 AM

## 2023-02-10 NOTE — Op Note (Signed)
OPERATIVE NOTE    PRE-OPERATIVE DIAGNOSIS: 1. ESRD   POST-OPERATIVE DIAGNOSIS: same as above  PROCEDURE: Ultrasound guidance for vascular access to the right internal jugular vein Fluoroscopic guidance for placement of catheter Placement of a 19 cm tip to cuff tunneled hemodialysis catheter via the right internal jugular vein  SURGEON: Leotis Pain, MD  ANESTHESIA:  Local with Moderate conscious sedation for approximately 17 minutes using 1 mg of Versed and 25 mcg of Fentanyl  ESTIMATED BLOOD LOSS: 10 cc  FLUORO TIME: less than one minute  CONTRAST: none  FINDING(S): 1.  Patent right internal jugular vein  SPECIMEN(S):  None  INDICATIONS:   John [name] is a 77 y.o.male who presents with renal failure.  The patient needs long term dialysis access for their ESRD, and a Permcath is necessary.  Risks and benefits are discussed and informed consent is obtained.    DESCRIPTION: After obtaining full informed written consent, the patient was brought back to the vascular suited. The patient's right neck and chest were sterilely prepped and draped in a sterile surgical field was created. Moderate conscious sedation was administered during a face to face encounter with the patient throughout the procedure with my supervision of the RN administering medicines and monitoring the patient's vital signs, pulse oximetry, telemetry and mental status throughout from the start of the procedure until the patient was taken to the recovery room.  The right internal jugular vein was visualized with ultrasound and found to be patent. It was then accessed under direct ultrasound guidance and a permanent image was recorded. A wire was placed. After skin nick and dilatation, the peel-away sheath was placed over the wire. I then turned my attention to an area under the clavicle. Approximately 1-2 fingerbreadths below the clavicle a small counterincision was created and tunneled from the subclavicular incision  to the access site. Using fluoroscopic guidance, a 19 centimeter tip to cuff tunneled hemodialysis catheter was selected, and tunneled from the subclavicular incision to the access site. It was then placed through the peel-away sheath and the peel-away sheath was removed. Using fluoroscopic guidance the catheter tips were parked in the right atrium. The appropriate distal connectors were placed. It withdrew blood well and flushed easily with heparinized saline and a concentrated heparin solution was then placed. It was secured to the chest wall with 2 Prolene sutures. The access incision was closed single 4-0 Monocryl. A 4-0 Monocryl pursestring suture was placed around the exit site. Sterile dressings were placed. The patient tolerated the procedure well and was taken to the recovery room in stable condition.  COMPLICATIONS: None  CONDITION: Stable  Leotis Pain, MD 02/10/2023 9:34 AM   This note was created with Dragon Medical transcription system. Any errors in dictation are purely unintentional.

## 2023-02-10 NOTE — Progress Notes (Signed)
PT Cancellation Note  Patient Details Name: Cail Stokley MRN: [id number] DOB: September 17, 1946   Cancelled Treatment:    Reason Eval/Treat Not Completed: Patient at procedure or test/unavailable. Pt at HD and unavailable for PT services.  Will attempt to see pt at a future date/time as medically appropriate.    Linus Salmons PT, DPT 02/10/23, 1:51 PM

## 2023-02-10 NOTE — Progress Notes (Signed)
Daily Progress Note   Patient Name: John [name]       Date: 02/10/2023 DOB: October 30, 1946  Age: 77 y.o. MRN#: [id number] Attending Physician: Sidney Ace, MD Primary Care Physician: Alisa Graff, FNP Admit Date: 01/29/2023  Reason for Consultation/Follow-up: Establishing goals of care  HPI/Brief Hospital Review: 77 year old male with a history of HTN, stage IV CKD-has not been on dialysis prior to admission, chronic diastolic and systolic heart failure with cor pulmonale, moderate tricuspid regurgitation and aortic valve regurgitation, history of ascending aortic aneurysm, CAD with atherosclerosis, COPD and chronic hypoxemia baseline 2 L/min supplemental oxygen at home, chronic thrombocytopenia and erythrocytosis, recurrent lower extremity cellulitis, recurrent hyperkalemia with a recent admission for acute CHF exacerbation requiring Lasix drip and BiPAP support, who was brought in by EMS on 01/29/2023 due to worsening altered mental status and circulatory shock.     Required intubation, CRRT and pressor support this admission. Successfully extubated 2/21.   Palliative Medicine consulted for assisting with goals of care conversations.  Subjective: Extensive chart review has been completed prior to meeting patient including labs, vital signs, imaging, progress notes, orders, and available advanced directive documents from current and previous encounters.    Per chart review, John [name] now agrees to proceed with hemodialysis. IR placement of Permacath today. On exam, John [name] drowsy-likely from sedation from procedure. Able to report he remembers visiting with me on Thursday last week and remembers our conversations. He denies acute pain or discomfort, N/V. Unable to fully  participate in conversation due to drowsiness.  John [name]-girlfriend at bedside during time of visit. Update provided as she was unaware of procedure today. Appreciative of update.   PMT will continue to follow for ongoing needs and support.  Objective:  Physical Exam Constitutional:      General: He is not in acute distress.    Appearance: He is not ill-appearing.     Comments: Drowsy but responds appropriately to verbal stimulus.  Pulmonary:     Effort: Pulmonary effort is normal. No respiratory distress.  Abdominal:     Palpations: Abdomen is soft.  Skin:    General: Skin is warm and dry.             Vital Signs: BP 102/65   Pulse 89   Temp 97.6 F (36.4 C) (Oral)  Resp 18   Ht '5\' 11"'$  (1.803 m)   Wt 84.2 kg   SpO2 93%   BMI 25.89 kg/m  SpO2: SpO2: 93 % O2 Device: O2 Device: Nasal Cannula O2 Flow Rate: O2 Flow Rate (L/min): 3 L/min   Palliative Care Assessment & Plan   Assessment/Recommendation/Plan  DNR Ongoing GOC conversations needed-will engage again this week in hopes of improved mentation, will need to secure HCPOA PMT to continue to follow for ongoing needs and support  Thank you for allowing the Palliative Medicine Team to assist in the care of this patient.  Total time:  25 minutes  Time spent includes: Detailed review of medical records (labs, imaging, vital signs), medically appropriate exam (mental status, respiratory, cardiac, skin), discussed with treatment team, counseling and educating patient, family and staff, documenting clinical information, medication management and coordination of care.  Theodoro Grist, DNP, AGNP-C Palliative Medicine   Please contact Palliative Medicine Team phone at [phone] for questions and concerns.

## 2023-02-10 NOTE — Progress Notes (Signed)
Received patient in bed to unit.  Alert and oriented.  Informed consent signed and in chart.   TX duration: 3 hrs  Patient tolerated well.  Transported back to the room  Alert, without acute distress.  Hand-off given to patient's nurse.   Access used: cvc Access issues: none  Total UF removed: 0 Medication(s) given: none Post HD VS: stable Post HD weight: 79 kg   [facility]

## 2023-02-10 NOTE — Progress Notes (Signed)
Clotting dialyzer and clotting lines  able to return blood  new set up and tx resumed.

## 2023-02-10 NOTE — Progress Notes (Signed)
OT Cancellation Note  Patient Details Name: John [name] MRN: [id number] DOB: 21-Jul-1946   Cancelled Treatment:    Reason Eval/Treat Not Completed: Patient at procedure or test/ unavailable (pt unavailable this morning 2/2 permacath procedure; unavailable this afternoon 2/2 getting HD.)  Vania Rea 02/10/2023, 1:52 PM

## 2023-02-10 NOTE — Progress Notes (Signed)
PROGRESS NOTE    John [name]  [id number] DOB: October 10, 1946 DOA: 01/29/2023 PCP: Alisa Graff, FNP    Brief Narrative:  77 year old male with a history of essential hypertension, on hydralazine and Cozaar, stage IV CKD but has not been on dialysis in the past, has chronic diastolic and systolic heart failure with cor pulmonale, moderate tricuspid regurgitation and aortic valve regurgitation, history of a ascending aortic aneurysm, coronary artery disease with atherosclerosis, COPD and chronic hypoxemia baseline 2 L/min supplemental oxygen at home with recurrent episodes of hypoxemia and remote acute exacerbation of COPD with hypoxemia and hypercapnia 2 months ago history of ruptured left quadricep tendon 2 years ago, chronic thrombocytopenia and erythrocytosis recurrent lower extremity cellulitis, recurrent hyperkalemia, morbid obesity history of anasarca dyslipidemia, recent admission for acute CHF exacerbation requiring Lasix drip and BiPAP support, who was brought in by EMS due to worsening altered mental status and circulatory shock.  01/30/23- I met with girlfriend of patient today to review short term medical plan.  He had Echo whith RV dysfunction and concern for PE.  We have started CRRT yesterday with additional interval improvement. He will have CTPE today.  He is weaned down on pressors today on vasor 0.4 and 4 of levophed reduced from 36 this morning.  01/31/23- remains agitated intermittently overnight. Continued issues with clot burden through CRRT circuit. 02/01/23- remains agitated overnight. Requiring numerous ativan pushes. Attempting to wean down sedation. Failed SBT miserably. 02/02/23- issues with CRRT overnight. Will attempt higher UF today. Assess for possible chest tube placement.  02/03/23- No issues with CRRT overnight, currently running without complication.  CT Head obtained overnight and negative for acute process.  Off Precedex this morning, currently somnolent, mental  status currently precluding extubation. Will perform SBT as mental status permits.  Stop Cefepime, checking ammonia. 02/04/23- Remains on CRRT. Pt more awake and tracking.  Tolerated SBT for 12 hrs  02/05/23- Pt successfully extubated 02/06/23-Off vasopressors.  Palliative Care consulted pt stating he DOES NOT want any form of dialysis.  If pt remains stable will transfer service to [facility] 2/23-care assumed by [facility] hospitalist service. 2/24-after discussion between palliative care, patient, patient's girlfriend the decision was made that the patient would remain DNR status however was interested in restarting hemodialysis.  Nephrology aware.  Kidney function continues to deteriorate. 2/25-repeated discussions with the patient.  He still seems somewhat unsure about dialysis however at this point is agreeing to move forward with PermCath placement.  Vascular surgery made aware.  Patient consented for surgery 2/26. 2/26-PermCath placed today.  Initiating hemodialysis.  Assessment & Plan:   Principal Problem:   Shock circulatory ([place]) Active Problems:   Acute on chronic congestive heart failure (HCC)   Acute hypoxic respiratory failure (HCC)   Toxic metabolic encephalopathy  Circulatory shock  Acute decompensated HFpEF and right-sided heart failure n RV dysfunction and dilation on echocardiography, concerning for RV failure and pulmonary hypertension. Porto-pulmonary hypertension on the differential given history of liver cirrhosis. Was on CVVH for ultra-filtration for volume removal, now refusing HD. goal MAP > 65. Plan: Patient agreeable to restarting hemodialysis now.  Cardiology has signed off.  No further diagnostics from their standpoint.  Acute Hypoxic Respiratory Failure Successfully extubated to nasal cannula on 2/22.  Doing well overall.  Remains on nasal cannula at 3 to 4 L. Monitor oxygen and continue to wean as tolerated Lasix challenge 100 mg IV x 1 on 2/24 Patient on 3 L 2/25.  Will  hold further Lasix at this time  For stage 0000000  Toxic Metabolic Encephalopathy Extubated successfully.  Mental status approaching baseline.  Mental status and in the setting of renal failure, psychotropic medications.  Acute kidney injury on chronic kidney disease stage IV Was on CRRT given hypotension.  Now agreeable to restart hemodialysis.  Kidney function continues to deteriorate.  PermCath placed.  First HD 2/26.  Dialysis coordinator to start looking for outpatient HD chair  Hepatic cirrhosis Thrombocytopenia No acute issues.  Does confer a poor prognosis.  Was on cefepime empirically given leukocytosis.  Cultures remain negative.  Finished 5 days of antibiotics.  Now discontinued.  DVT prophylaxis: SCD Code Status: DNR Family Communication: None today Disposition Plan: Status is: Inpatient Remains inpatient appropriate because: Resolving respiratory failure and encephalopathy.  Patient now agreeable to restarting hemodialysis.  Remains DNR.  PermCath placed today.  Initiating inpatient hemodialysis.  Will need outpatient chair before discharge planning  Level of care: Progressive  Consultants:  Cardiology Palliative care   Procedures:  femoral dialysis catheter, now removed  Antimicrobials:   Subjective: Seen and examined.  Appears ill.  Altered, fatigued  Objective: Vitals:   02/10/23 1015 02/10/23 1032 02/10/23 1349 02/10/23 1359  BP: 102/65   (!) 105/33  Pulse: 89   85  Resp: 18   20  Temp:    98.3 F (36.8 C)  TempSrc:    Oral  SpO2: 93%   97%  Weight:  84.2 kg 79 kg   Height:        Intake/Output Summary (Last 24 hours) at 02/10/2023 1413 Last data filed at 02/10/2023 1100 Gross per 24 hour  Intake 325 ml  Output 1380 ml  Net -1055 ml   Filed Weights   02/08/23 1538 02/10/23 1032 02/10/23 1349  Weight: 82.7 kg 84.2 kg 79 kg    Examination:  General exam: Ill-appearing.  Frail Respiratory system: Bilateral crackles.  Normal work of breathing.   3 L Cardiovascular system: Tachycardic, regular rhythm, no murmurs Gastrointestinal system: Soft, NT/ND, normal bowel sounds Central nervous system: Lethargic.  Oriented to person only.  No focal deficits Extremities: Decreased power bilateral lower extremities Skin: Right mid back wound Psychiatry: Judgement and insight appear normal. Mood & affect appropriate.     Data Reviewed: I have personally reviewed following labs and imaging studies  CBC: Recent Labs  Lab 02/04/23 0400 02/05/23 0411 02/06/23 0520 02/07/23 0629  WBC 13.3* 11.9* 10.2 12.7*  NEUTROABS  --   --  8.0* 10.2*  HGB 13.6 13.5 12.6* 13.2  HCT 43.0 42.7 40.4 41.4  MCV 101.7* 103.4* 103.6* 102.7*  PLT 38* 57* 73* 83*   Basic Metabolic Panel: Recent Labs  Lab 02/06/23 0520 02/07/23 0629 02/08/23 0633 02/09/23 0600 02/10/23 0619  NA 141 142 143 143 142  K 3.8 4.0 4.1 3.8 4.0  CL 107 108 106 105 104  CO2 '28 26 28 30 29  '$ GLUCOSE 112* 143* 172* 126* 124*  BUN 89* 93* 96* 97* 94*  CREATININE 2.81* 2.87* 3.04* 3.13* 3.02*  CALCIUM 9.3 9.2 9.5 9.1 9.4  MG 2.4 2.6* 2.7* 2.5* 2.7*  PHOS 3.4 3.6 4.5 4.3 3.8   GFR: Estimated Creatinine Clearance: 21.8 mL/min (A) (by C-G formula based on SCr of 3.02 mg/dL (H)). Liver Function Tests: Recent Labs  Lab 02/06/23 0520 02/07/23 0629 02/08/23 0633 02/09/23 0600 02/10/23 0619  ALBUMIN 2.6* 2.9* 2.9* 2.6* 2.5*   No results for input(s): "LIPASE", "AMYLASE" in the last 168 hours. Recent Labs  Lab 02/05/23 1021  AMMONIA 22   Coagulation Profile: No results for input(s): "INR", "PROTIME" in the last 168 hours. Cardiac Enzymes: No results for input(s): "CKTOTAL", "CKMB", "CKMBINDEX", "TROPONINI" in the last 168 hours. BNP (last 3 results) No results for input(s): "PROBNP" in the last 8760 hours. HbA1C: No results for input(s): "HGBA1C" in the last 72 hours. CBG: Recent Labs  Lab 02/07/23 1916 02/08/23 0006 02/08/23 0203 02/08/23 0406 02/08/23 0729   GLUCAP 126* 112* 125* 124* 134*   Lipid Profile: No results for input(s): "CHOL", "HDL", "LDLCALC", "TRIG", "CHOLHDL", "LDLDIRECT" in the last 72 hours. Thyroid Function Tests: No results for input(s): "TSH", "T4TOTAL", "FREET4", "T3FREE", "THYROIDAB" in the last 72 hours. Anemia Panel: No results for input(s): "VITAMINB12", "FOLATE", "FERRITIN", "TIBC", "IRON", "RETICCTPCT" in the last 72 hours. Sepsis Labs: No results for input(s): "PROCALCITON", "LATICACIDVEN" in the last 168 hours.  No results found for this or any previous visit (from the past 240 hour(s)).        Radiology Studies: PERIPHERAL VASCULAR CATHETERIZATION  Result Date: 02/10/2023 See surgical note for result.       Scheduled Meds:  ascorbic acid  500 mg Oral BID   aspirin  81 mg Oral Daily   Chlorhexidine Gluconate Cloth  6 each Topical Daily   Chlorhexidine Gluconate Cloth  6 each Topical Q0600   docusate sodium  100 mg Oral BID   feeding supplement  237 mL Oral TID BM   fentaNYL       folic acid  1 mg Oral Daily   heparin       heparin injection (subcutaneous)  5,000 Units Subcutaneous Q8H   metoprolol tartrate  12.5 mg Oral BID   midazolam       multivitamin with minerals  1 tablet Oral Daily   mouth rinse  15 mL Mouth Rinse 4 times per day   thiamine  100 mg Oral Daily   Continuous Infusions:  sodium chloride 10 mL/hr at 02/10/23 [id number]   anticoagulant sodium citrate       LOS: 12 days    Sidney Ace, MD [facility]   If 7PM-7AM, please contact night-coverage  02/10/2023, 2:13 PM

## 2023-02-10 NOTE — Progress Notes (Signed)
Patient was being prepped for a procedure, we noticed the right arm/hand was swollen, red, and painful. Provider notified.

## 2023-02-11 ENCOUNTER — Inpatient Hospital Stay: Payer: Medicare HMO

## 2023-02-11 DIAGNOSIS — R579 Shock, unspecified: Secondary | ICD-10-CM | POA: Diagnosis not present

## 2023-02-11 LAB — RENAL FUNCTION PANEL
Albumin: 2.4 g/dL — ABNORMAL LOW (ref 3.5–5.0)
Anion gap: 12 (ref 5–15)
BUN: 60 mg/dL — ABNORMAL HIGH (ref 8–23)
CO2: 27 mmol/L (ref 22–32)
Calcium: 8.9 mg/dL (ref 8.9–10.3)
Chloride: 100 mmol/L (ref 98–111)
Creatinine, Ser: 1.99 mg/dL — ABNORMAL HIGH (ref 0.61–1.24)
GFR, Estimated: 34 mL/min — ABNORMAL LOW (ref >60)
Glucose, Bld: 110 mg/dL — ABNORMAL HIGH (ref 70–99)
Phosphorus: 3.1 mg/dL (ref 2.5–4.6)
Potassium: 3.9 mmol/L (ref 3.5–5.1)
Sodium: 139 mmol/L (ref 135–145)

## 2023-02-11 LAB — CBC
HCT: 40.5 % (ref 39.0–52.0)
Hemoglobin: 12.6 g/dL — ABNORMAL LOW (ref 13.0–17.0)
MCH: 32.3 pg (ref 26.0–34.0)
MCHC: 31.1 g/dL (ref 30.0–36.0)
MCV: 103.8 fL — ABNORMAL HIGH (ref 80.0–100.0)
Platelets: 149 10*3/uL — ABNORMAL LOW (ref 150–400)
RBC: 3.9 MIL/uL — ABNORMAL LOW (ref 4.22–5.81)
RDW: 15.3 % (ref 11.5–15.5)
WBC: 10.5 10*3/uL (ref 4.0–10.5)
nRBC: 0 % (ref 0.0–0.2)

## 2023-02-11 LAB — MAGNESIUM: Magnesium: 2.2 mg/dL (ref 1.7–2.4)

## 2023-02-11 MED ORDER — OXYCODONE HCL 5 MG PO TABS
5.0000 mg | ORAL_TABLET | ORAL | Status: DC | PRN
  Administered 2023-02-11 – 2023-02-13 (×2): 5 mg via ORAL
  Filled 2023-02-11 (×2): qty 1

## 2023-02-11 NOTE — Progress Notes (Signed)
  Received patient in bed to unit.   Informed consent signed and in chart.    TX duration:3.5hrs     Transported by back to unit  Hand-off given to patient's nurse.  No c/o no distress upon discharge noted   Access used: R catheter  Access issues: none   Total UF removed: 0L Medication(s) given: none Post HD VS: 125/76 p 92 Post HD weight: 79.3kg     Darrol Jump LPN [facility]

## 2023-02-11 NOTE — Progress Notes (Signed)
OT Cancellation Note  Patient Details Name: John [name] MRN: [id number] DOB: 12/16/1946   Cancelled Treatment:    Reason Eval/Treat Not Completed: Fatigue/lethargy limiting ability to participate. Pt attended HD this morning, participated with PT and ST this afternoon. Stated he was too tired to engage in an additional session with OT at this time. Will attempt at a later date as pt is available and able to participate.  Josiah Lobo 02/11/2023, 3:09 PM

## 2023-02-11 NOTE — Progress Notes (Addendum)
Speech Language Pathology Treatment: Dysphagia  Patient Details Name: John [name] MRN: [id number] DOB: 1946-10-14 Today's Date: 02/11/2023 Time: 0300-0330 SLP Time Calculation (min) (ACUTE ONLY): 30 min  Assessment / Plan / Recommendation Clinical Impression  Pt seen today for dysphagia treatment. Pt laying in bed w/ family present upon SLP arrival. Pt alert and cooperative during session. Pt left laying in bed w/ family/OT present, call button in reach, and bed alarm set.   Pt on RA; Afebrile; WBC WNL. Pt recently intubated for 8 days (2/14-2/21).  Pt observed w/ trials of solids and thin liquids (diet recently upgraded to regular consistency renal diet by MD order). He expressed feeling full and using gestures to communicate he did not want to eat. Given encouragement and set up, pt fed himself several bites/sips. Pt's oropharyngeal swallow was FUNCTIONAL for pt's needs. During oral phase noted adequate mastication, timely A-P bolus transit, and clear oral cavity post-po's. Noted mildly prolonged oral phase w/ larger bites in setting of missing dentition and generally slow motor movement. Slow motor movements were also noted when working with PT and feeding himself, indicating slow mastication is likely consistent w/ his motor movements overall. Prolonged oral phase would likely improve w/ smaller bites of solids.  Pt/family educated at length on importance of small bites for easier bolus management. His oral phase remained functional for adequate mastication of solids. During pharyngeal phase, pt did not appear to exhibit any overt s/s of aspiration including coughing, throat clearing, nor wet vocal quality.  Recommend continue regular diet (w/ meats and spaghetti chopped) w/ thin liquids -- dietician updated and made modifications to diet already in chart. Follow General aspiration precautions at all po's. Recommend meds crushed in puree. Recommend assistance for set up and intermittent  supervision during meals for compensatory strategies and aspiration precautions. ST services will continue to f/u in next 1-2 days for diet toleration.   HPI HPI: Per H&P, pt is 77 year old male with a history of essential hypertension, on hydralazine and Cozaar, stage IV CKD but has not been on dialysis in the past, has chronic diastolic and systolic heart failure with cor pulmonale, moderate tricuspid regurgitation and aortic valve regurgitation, history of a ascending aortic aneurysm, coronary artery disease with atherosclerosis, COPD and chronic hypoxemia baseline 2 L/min supplemental oxygen at home with recurrent episodes of hypoxemia and remote acute exacerbation of COPD with hypoxemia and hypercapnia 2 months ago history of ruptured left quadricep tendon 2 years ago, chronic thrombocytopenia and erythrocytosis recurrent lower extremity cellulitis, recurrent hyperkalemia, morbid obesity history of anasarca dyslipidemia, recent admission for acute CHF exacerbation requiring Lasix drip and BiPAP support, who was brought in by EMS due to worsening altered mental status and circulatory shock.  I was able to meet with wife at bedside during my evaluation who shares patient has been sleeping majority of each day over the last week has been on arousable and received phone call from medical provider regarding worsening kidney function which prompted ER evaluation.  He had chest x-ray performed while in the ER showing bilateral pleural effusions with vascular congestion and atelectasis/infiltrate.  Patient in circulatory shock with Levophed support on admission in ER.  PCCM consultation for admission to medical intensive care unit with circulatory shock consistent with acute on chronic renal failure with CHF exacerbation. Pt inubated on 2/14 and extubated on 2/21.      SLP Plan  Continue with current plan of care      Recommendations for follow up therapy are one component  of a multi-disciplinary discharge  planning process, led by the attending physician.  Recommendations may be updated based on patient status, additional functional criteria and insurance authorization.    Recommendations  Diet recommendations: Regular;Thin liquid (chooped meat and spaghetti) Liquids provided via: Cup;Straw Medication Administration: Crushed with puree Supervision: Patient able to self feed;Staff to assist with self feeding;Intermittent supervision to cue for compensatory strategies Compensations: Minimize environmental distractions;Slow rate;Small sips/bites;Lingual sweep for clearance of pocketing;Follow solids with liquid Postural Changes and/or Swallow Maneuvers: Out of bed for meals;Seated upright 90 degrees;Upright 30-60 min after meal                Oral Care Recommendations: Oral care BID;Oral care before and after PO;Patient independent with oral care Follow Up Recommendations: Skilled nursing-short term rehab (<3 hours/day) Assistance recommended at discharge: Frequent or constant Supervision/Assistance SLP Visit Diagnosis: Dysphagia, oral phase (R13.11) Plan: Continue with current plan of care         [place], Speech Pathology   Randall Hiss  02/11/2023, 3:51 PM

## 2023-02-11 NOTE — Progress Notes (Signed)
[name]  ROUNDING NOTE   Subjective:   [name] was admitted to [facility] on 01/29/2023 for Peripheral edema [R60.9] Shock circulatory ([place]) [R57.9] Acute on chronic congestive heart failure, unspecified heart failure type ([place]) [I50.9] Acute renal failure superimposed on chronic kidney disease, unspecified acute renal failure type, unspecified CKD stage ([place]) [N17.9, N18.9]  Patient seen and evaluated during dialysis   HEMODIALYSIS FLOWSHEET:  Blood Flow Rate (mL/min): 400 mL/min Arterial Pressure (mmHg): -190 mmHg Venous Pressure (mmHg): 190 mmHg TMP (mmHg): -11 mmHg Ultrafiltration Rate (mL/min): 257 mL/min Dialysate Flow Rate (mL/min): 300 ml/min Dialysis Fluid Bolus: Normal Saline Bolus Amount (mL): 300 mL  Tolerating treatment well No complaints at this time Alert and oriented to self, place, situation Soft blood pressure  Objective:  Vital signs in last 24 hours:  Temp:  [97.4 F (36.3 C)-98.3 F (36.8 C)] 97.9 F (36.6 C) (02/27 0753) Pulse Rate:  [81-99] 92 (02/27 0900) Resp:  [16-25] 19 (02/27 0900) BP: (78-132)/(33-88) 120/75 (02/27 0900) SpO2:  [88 %-99 %] 97 % (02/27 0900) Weight:  [79 kg-84.2 kg] 79 kg (02/26 1349)  Weight change:  Filed Weights   02/08/23 1538 02/10/23 1032 02/10/23 1349  Weight: 82.7 kg 84.2 kg 79 kg    Intake/Output: I/O last 3 completed shifts: In: [id number] [P.O.:765] Out: 1680 [Urine:1680]   Intake/Output this shift:  No intake/output data recorded.  Physical Exam: General: Ill appearing  Head: Normocephalic, atraumatic   Eyes: Anicteric  Lungs:  Basilar rhonchi, normal effort, 3 L [place] O2  Heart: regular  Abdomen:  Soft, nontender  Extremities: + dependent edema.    Neurologic: Alert and oriented x 3  Skin: Left lower leg wound in clean and dry dressing, bilateral SCDs  Access: none    Basic Metabolic Panel: Recent Labs  Lab 02/07/23 0629 02/08/23 0633 02/09/23 0600 02/10/23 0619  02/11/23 0551  NA 142 143 143 142 139  K 4.0 4.1 3.8 4.0 3.9  CL 108 106 105 104 100  CO2 '26 28 30 29 27  '$ GLUCOSE 143* 172* 126* 124* 110*  BUN 93* 96* 97* 94* 60*  CREATININE 2.87* 3.04* 3.13* 3.02* 1.99*  CALCIUM 9.2 9.5 9.1 9.4 8.9  MG 2.6* 2.7* 2.5* 2.7* 2.2  PHOS 3.6 4.5 4.3 3.8 3.1     Liver Function Tests: Recent Labs  Lab 02/07/23 0629 02/08/23 0633 02/09/23 0600 02/10/23 0619 02/11/23 0551  ALBUMIN 2.9* 2.9* 2.6* 2.5* 2.4*    No results for input(s): "LIPASE", "AMYLASE" in the last 168 hours. Recent Labs  Lab 02/05/23 1021  AMMONIA 22     CBC: Recent Labs  Lab 02/05/23 0411 02/06/23 0520 02/07/23 0629 02/11/23 0546  WBC 11.9* 10.2 12.7* 10.5  NEUTROABS  --  8.0* 10.2*  --   HGB 13.5 12.6* 13.2 12.6*  HCT 42.7 40.4 41.4 40.5  MCV 103.4* 103.6* 102.7* 103.8*  PLT 57* 73* 83* 149*     Cardiac Enzymes: No results for input(s): "CKTOTAL", "CKMB", "CKMBINDEX", "TROPONINI" in the last 168 hours.  BNP: Invalid input(s): "POCBNP"  CBG: Recent Labs  Lab 02/07/23 1916 02/08/23 0006 02/08/23 0203 02/08/23 0406 02/08/23 0729  GLUCAP 126* 112* 125* 91* 134*     Microbiology: Results for orders placed or performed during the hospital encounter of 01/29/23  Culture, Respiratory w Gram Stain     Status: None   Collection Time: 01/29/23  3:45 AM   Specimen: Tracheal Aspirate; Respiratory  Result Value Ref Range Status   Specimen  Description   Final    TRACHEAL ASPIRATE Performed at [facility], [address] [place], [place] [address]    Special Requests   Final    NONE Performed at [facility], [place], [place] [address]    Gram Stain   Final    MODERATE WBC PRESENT,BOTH PMN AND MONONUCLEAR NO ORGANISMS SEEN    Culture   Final    RARE Normal respiratory flora-no Staph aureus or Pseudomonas seen Performed at [facility] [address], [place], [place] [address]    Report Status  02/01/2023 FINAL  Final  Culture, blood (Routine X 2) w Reflex to ID Panel     Status: None   Collection Time: 01/29/23  1:10 PM   Specimen: BLOOD  Result Value Ref Range Status   Specimen Description BLOOD BLOOD LEFT ARM  Final   Special Requests   Final    BOTTLES DRAWN AEROBIC AND ANAEROBIC Blood Culture adequate volume   Culture   Final    NO GROWTH 5 DAYS Performed at [facility], [address], [place], [place] [address]    Report Status 02/03/2023 FINAL  Final  MRSA Next Gen by PCR, Nasal     Status: None   Collection Time: 01/29/23  1:47 PM   Specimen: Nasal Mucosa; Nasal Swab  Result Value Ref Range Status   MRSA by PCR Next Gen NOT DETECTED NOT DETECTED Final    Comment: (NOTE) The GeneXpert MRSA Assay (FDA approved for NASAL specimens only), is one component of a comprehensive MRSA colonization surveillance program. It is not intended to diagnose MRSA infection nor to guide or monitor treatment for MRSA infections. Test performance is not FDA approved in patients less than 55 years old. Performed at [facility], [address] [place], [place] [address]   Culture, blood (Routine X 2) w Reflex to ID Panel     Status: None   Collection Time: 01/29/23  2:10 PM   Specimen: BLOOD  Result Value Ref Range Status   Specimen Description BLOOD A-LINE  Final   Special Requests   Final    BOTTLES DRAWN AEROBIC AND ANAEROBIC Blood Culture adequate volume   Culture   Final    NO GROWTH 5 DAYS Performed at [facility], [address] [place],  [address]    Report Status 02/03/2023 FINAL  Final    Coagulation Studies: No results for input(s): "LABPROT", "INR" in the last 72 hours.   Urinalysis: No results for input(s): "COLORURINE", "LABSPEC", "PHURINE", "GLUCOSEU", "HGBUR", "BILIRUBINUR", "KETONESUR", "PROTEINUR", "UROBILINOGEN", "NITRITE", "LEUKOCYTESUR" in the last 72 hours.  Invalid input(s): "APPERANCEUR"      Imaging: PERIPHERAL VASCULAR CATHETERIZATION  Result Date: 02/10/2023 See surgical note for result.    Medications:    sodium chloride 10 mL/hr at 02/10/23 [id number]     ascorbic acid  500 mg Oral BID   aspirin  81 mg Oral Daily   Chlorhexidine Gluconate Cloth  6 each Topical Q0600   docusate sodium  100 mg Oral BID   feeding supplement  237 mL Oral TID BM   folic acid  1 mg Oral Daily   heparin injection (subcutaneous)  5,000 Units Subcutaneous Q8H   metoprolol tartrate  12.5 mg Oral BID   multivitamin with minerals  1 tablet Oral Daily   mouth rinse  15 mL Mouth Rinse 4 times per day   thiamine  100 mg Oral Daily   diclofenac  Sodium, docusate sodium, mouth rinse, polyethylene glycol  Assessment/ Plan:  Mr. Jencarlos Ply is a 77 y.o. black male with chronic diastolic congestive heart failure, hypertension, COPD, hyperlipidemia, who is admitted to [facility] on 01/29/2023 for Peripheral edema [R60.9] Shock circulatory ([place]) [R57.9] Acute on chronic congestive heart failure, unspecified heart failure type ([place]) [I50.9] Acute renal failure superimposed on chronic kidney disease, unspecified acute renal failure type, unspecified CKD stage ([place]) [N17.9, N18.9]  Acute Kidney Injury on chronic kidney disease stage IV: baseline creatinine of 2.67, GFR of 24 on 01/06/23. History of bland urine. Acute kidney injury most likely secondary to cardiorenal syndrome. Chronic kidney disease secondary to hypertension. Required CRRT from 2/16 - 2/20. Intermitted hemodialysis on 2/21 with hypotension and thrombosis. Did not tolerate treatment. The next day, patient said he did not want dialysis and his dialysis catheter was removed. However on 2/23, patient says he is open to dialysis if necessary.  Appreciate vascular surgery placing PermCath yesterday.  Dialysis received yesterday, tolerated well.  Receiving dialysis today, UF 0. Uremic symptoms decreasing, improved orientation and alertness. Diuretics as  needed Continue to monitor volume status, urine output, renal function and serum electrolytes.   Acute exacerbation of chronic diastolic congestive heart failure and acute respiratory failure requiring intubation and mechanical ventilation.  Extubated on 02/05/23. Home regimen was torsemide '60mg'$  daily and metolazone 2.'5mg'$  twice a week.   Remains on 3L [place].   Hypertension: with hypotension with cardiogenic shock required vasopressors on admission. 123/82 - currently on metoprolol for rate control and blood pressure.   Blood pressure soft in initiation of dialysis, 84/35.  No fluid removal scheduled for today.  Will continue to monitor.   LOS: [place] 2/27/20249:17 AM

## 2023-02-11 NOTE — Progress Notes (Signed)
PROGRESS NOTE    John [name]  [id number] DOB: Sep 10, 1946 DOA: 01/29/2023 PCP: Alisa Graff, FNP    Brief Narrative:  77 year old male with a history of essential hypertension, on hydralazine and Cozaar, stage IV CKD but has not been on dialysis in the past, has chronic diastolic and systolic heart failure with cor pulmonale, moderate tricuspid regurgitation and aortic valve regurgitation, history of a ascending aortic aneurysm, coronary artery disease with atherosclerosis, COPD and chronic hypoxemia baseline 2 L/min supplemental oxygen at home with recurrent episodes of hypoxemia and remote acute exacerbation of COPD with hypoxemia and hypercapnia 2 months ago history of ruptured left quadricep tendon 2 years ago, chronic thrombocytopenia and erythrocytosis recurrent lower extremity cellulitis, recurrent hyperkalemia, morbid obesity history of anasarca dyslipidemia, recent admission for acute CHF exacerbation requiring Lasix drip and BiPAP support, who was brought in by EMS due to worsening altered mental status and circulatory shock.  01/30/23- I met with girlfriend of patient today to review short term medical plan.  He had Echo whith RV dysfunction and concern for PE.  We have started CRRT yesterday with additional interval improvement. He will have CTPE today.  He is weaned down on pressors today on vasor 0.4 and 4 of levophed reduced from 36 this morning.  01/31/23- remains agitated intermittently overnight. Continued issues with clot burden through CRRT circuit. 02/01/23- remains agitated overnight. Requiring numerous ativan pushes. Attempting to wean down sedation. Failed SBT miserably. 02/02/23- issues with CRRT overnight. Will attempt higher UF today. Assess for possible chest tube placement.  02/03/23- No issues with CRRT overnight, currently running without complication.  CT Head obtained overnight and negative for acute process.  Off Precedex this morning, currently somnolent, mental  status currently precluding extubation. Will perform SBT as mental status permits.  Stop Cefepime, checking ammonia. 02/04/23- Remains on CRRT. Pt more awake and tracking.  Tolerated SBT for 12 hrs  02/05/23- Pt successfully extubated 02/06/23-Off vasopressors.  Palliative Care consulted pt stating he DOES NOT want any form of dialysis.  If pt remains stable will transfer service to [facility] 2/23-care assumed by [facility] hospitalist service. 2/24-after discussion between palliative care, patient, patient's girlfriend the decision was made that the patient would remain DNR status however was interested in restarting hemodialysis.  Nephrology aware.  Kidney function continues to deteriorate. 2/25-repeated discussions with the patient.  He still seems somewhat unsure about dialysis however at this point is agreeing to move forward with PermCath placement.  Vascular surgery made aware.  Patient consented for surgery 2/26. 2/26-PermCath placed today.  Initiating hemodialysis. 2/27-status post hemodialysis today.  Doing reasonably well.  Mental status improved.  Is pretty clear with [place] that he does not want hemodialysis.  Assessment & Plan:   Principal Problem:   Shock circulatory ([place]) Active Problems:   Acute on chronic congestive heart failure (HCC)   Acute hypoxic respiratory failure (HCC)   Toxic metabolic encephalopathy  Circulatory shock  Acute decompensated HFpEF and right-sided heart failure n RV dysfunction and dilation on echocardiography, concerning for RV failure and pulmonary hypertension. Porto-pulmonary hypertension on the differential given history of liver cirrhosis. Was on CVVH for ultra-filtration for volume removal, now refusing HD. goal MAP > 65. Plan: Patient agreeable to restarting hemodialysis now.  Cardiology has signed off.  No further diagnostics from their standpoint.  Acute Hypoxic Respiratory Failure Successfully extubated to nasal cannula on 2/22.  Doing well overall.  Remains  on nasal cannula at 3 to 4 L. Monitor oxygen and continue to wean  as tolerated Lasix challenge 100 mg IV x 1 on 2/24 Patient on 3L 2/27.  Will hold further Lasix at this time  Toxic Metabolic Encephalopathy Extubated successfully.  Mental status approaching baseline.  Mental status and in the setting of renal failure, psychotropic medications.  Waxing and waning  Acute kidney injury on chronic kidney disease stage IV Was on CRRT given hypotension.  Now agreeable to restart hemodialysis.  Kidney function continues to deteriorate.  PermCath placed.  First HD 2/26, second HD run 2/27.  Dialysis coordinator to start looking for outpatient HD chair.  Unclear whether this is within the patient's wishes.  Nephrology to wait 2 days prior to considering restarting hemodialysis  Hepatic cirrhosis Thrombocytopenia No acute issues.  Does confer a poor prognosis.  Was on cefepime empirically given leukocytosis.  Cultures remain negative.  Finished 5 days of antibiotics.  Now discontinued.  DVT prophylaxis: SCD Code Status: DNR Family Communication: None today Disposition Plan: Status is: Inpatient Remains inpatient appropriate because: Resolving respiratory failure and encephalopathy.  Patient now agreeable to restarting hemodialysis.  Remains DNR.  PermCath placed as 26.  Nephrology following for hemodialysis needs.  Palliative care following for further goals of care discussion  Level of care: Progressive  Consultants:  Cardiology Palliative care   Procedures:  femoral dialysis catheter, now removed  Antimicrobials:   Subjective: Seen and examined.  Fatigued.  Status improved  Objective: Vitals:   02/11/23 1100 02/11/23 1134 02/11/23 1136 02/11/23 1204  BP: 110/81 125/76  121/78  Pulse: 93 92  95  Resp: (!) 23 (!) 23  18  Temp:  97.7 F (36.5 C)  98.2 F (36.8 C)  TempSrc:  Oral  Oral  SpO2: 96% 94%  97%  Weight:   79.3 kg   Height:        Intake/Output Summary (Last 24 hours)  at 02/11/2023 1508 Last data filed at 02/11/2023 1200 Gross per 24 hour  Intake 599.83 ml  Output 850 ml  Net -250.17 ml   Filed Weights   02/10/23 1032 02/10/23 1349 02/11/23 1136  Weight: 84.2 kg 79 kg 79.3 kg    Examination:  General exam: Appears fatigued, frail Respiratory system: Bibasilar crackles.  Normal work of breathing.  3 L Cardiovascular system: Tachycardic, regular rhythm, no murmurs Gastrointestinal system: Soft, NT/ND, normal bowel sounds Central nervous system: Awake and alert, oriented x 2, no focal deficits Extremities: Decreased power bilateral lower extremities Skin: Right mid back wound Psychiatry: Judgement and insight appear normal. Mood & affect appropriate.     Data Reviewed: I have personally reviewed following labs and imaging studies  CBC: Recent Labs  Lab 02/05/23 0411 02/06/23 0520 02/07/23 0629 02/11/23 0546  WBC 11.9* 10.2 12.7* 10.5  NEUTROABS  --  8.0* 10.2*  --   HGB 13.5 12.6* 13.2 12.6*  HCT 42.7 40.4 41.4 40.5  MCV 103.4* 103.6* 102.7* 103.8*  PLT 57* 73* 83* [id number]*   Basic Metabolic Panel: Recent Labs  Lab 02/07/23 0629 02/08/23 0633 02/09/23 0600 02/10/23 0619 02/11/23 0551  NA 142 143 143 142 139  K 4.0 4.1 3.8 4.0 3.9  CL 108 106 105 104 100  CO2 '26 28 30 29 27  '$ GLUCOSE 143* 172* 126* 124* 110*  BUN 93* 96* 97* 94* 60*  CREATININE 2.87* 3.04* 3.13* 3.02* 1.99*  CALCIUM 9.2 9.5 9.1 9.4 8.9  MG 2.6* 2.7* 2.5* 2.7* 2.2  PHOS 3.6 4.5 4.3 3.8 3.1   GFR: Estimated Creatinine Clearance: 33.1 mL/min (A) (  by C-G formula based on SCr of 1.99 mg/dL (H)). Liver Function Tests: Recent Labs  Lab 02/07/23 0629 02/08/23 [id number] 02/09/23 0600 02/10/23 0619 02/11/23 0551  ALBUMIN 2.9* 2.9* 2.6* 2.5* 2.4*   No results for input(s): "LIPASE", "AMYLASE" in the last 168 hours. Recent Labs  Lab 02/05/23 1021  AMMONIA 22   Coagulation Profile: No results for input(s): "INR", "PROTIME" in the last 168 hours. Cardiac  Enzymes: No results for input(s): "CKTOTAL", "CKMB", "CKMBINDEX", "TROPONINI" in the last 168 hours. BNP (last 3 results) No results for input(s): "PROBNP" in the last 8760 hours. HbA1C: No results for input(s): "HGBA1C" in the last 72 hours. CBG: Recent Labs  Lab 02/07/23 1916 02/08/23 0006 02/08/23 0203 02/08/23 0406 02/08/23 0729  GLUCAP 126* 112* 125* 124* 134*   Lipid Profile: No results for input(s): "CHOL", "HDL", "LDLCALC", "TRIG", "CHOLHDL", "LDLDIRECT" in the last 72 hours. Thyroid Function Tests: No results for input(s): "TSH", "T4TOTAL", "FREET4", "T3FREE", "THYROIDAB" in the last 72 hours. Anemia Panel: No results for input(s): "VITAMINB12", "FOLATE", "FERRITIN", "TIBC", "IRON", "RETICCTPCT" in the last 72 hours. Sepsis Labs: No results for input(s): "PROCALCITON", "LATICACIDVEN" in the last 168 hours.  No results found for this or any previous visit (from the past 240 hour(s)).        Radiology Studies: PERIPHERAL VASCULAR CATHETERIZATION  Result Date: 02/10/2023 See surgical note for result.       Scheduled Meds:  ascorbic acid  500 mg Oral BID   aspirin  81 mg Oral Daily   Chlorhexidine Gluconate Cloth  6 each Topical Q0600   docusate sodium  100 mg Oral BID   feeding supplement  237 mL Oral TID BM   folic acid  1 mg Oral Daily   heparin injection (subcutaneous)  5,000 Units Subcutaneous Q8H   metoprolol tartrate  12.5 mg Oral BID   multivitamin with minerals  1 tablet Oral Daily   mouth rinse  15 mL Mouth Rinse 4 times per day   thiamine  100 mg Oral Daily   Continuous Infusions:  sodium chloride Stopped (02/11/23 0700)     LOS: 13 days    Sidney Ace, MD [facility]   If 7PM-7AM, please contact night-coverage  02/11/2023, 3:08 PM

## 2023-02-11 NOTE — Progress Notes (Signed)
Physical Therapy Treatment Patient Details Name: John [name] MRN: [id number] DOB: 1946/01/25 Today's Date: 02/11/2023   History of Present Illness Pt is 77 year old male with a history of essential HTN, stage IV CKD, chronic diastolic and systolic heart failure with cor pulmonale, moderate tricuspid regurgitation and aortic valve regurgitation, ascending aortic aneurysm, CAD, COPD and chronic hypoxemia baseline 2 L/min supplemental oxygen at home, recurrent episodes of hypoxemia, ruptured left quadricep tendon, thrombocytopenia, hyperkalemia, morbid obesity history of anasarca dyslipidemia, and recent admission for acute CHF exacerbation who was brought in by EMS due to worsening altered mental status and circulatory shock.  MD assessment includes: Acute hypoxic respiratory failure in setting of pulmonary edema, pulmonary hypertension, & small bilateral pleural effusions, AKI, thrombocytopenia, leukocytosis, and acute metabolic encephalopathy vs possible hepatic encephalopathy.    PT Comments    Pt was pleasant and motivated to participate during the session and put forth good effort throughout. Pt required extensive physical assistance with bed mobility tasks along with cuing for sequencing and use of bed rails.  Pt able to maintain static sitting at EOB with close SBA and was able to come to standing twice from an elevated EOB with +2 assist and with pt pulling up from sink.  Pt unable to come to full upright standing and instead stood with mod trunk flexion and min knee flexion and was unable to advance either LE during attempts at gait.  Max standing time during each stand was 20-30 sec.  No adverse symptoms noted other than left lower leg pain around the bandaged area with SpO2 and HR WNL.  Pt will benefit from PT services in a SNF setting upon discharge to safely address deficits listed in patient problem list for decreased caregiver assistance and eventual return to PLOF.     Recommendations  for follow up therapy are one component of a multi-disciplinary discharge planning process, led by the attending physician.  Recommendations may be updated based on patient status, additional functional criteria and insurance authorization.  Follow Up Recommendations  Skilled nursing-short term rehab (<3 hours/day) Can patient physically be transported by private vehicle: No   Assistance Recommended at Discharge Frequent or constant Supervision/Assistance  Patient can return home with the following Two people to help with walking and/or transfers;Two people to help with bathing/dressing/bathroom;Assistance with cooking/housework;Direct supervision/assist for medications management;Help with stairs or ramp for entrance;Assist for transportation;Direct supervision/assist for financial management   Equipment Recommendations  Other (comment) (TBD)    Recommendations for Other Services       Precautions / Restrictions Precautions Precautions: Fall Restrictions Other Position/Activity Restrictions: Tunneled hemodialysis catheter via the right internal jugular vein placed 2/26     Mobility  Bed Mobility Overal bed mobility: Needs Assistance Bed Mobility: Supine to Sit, Sit to Supine     Supine to sit: Mod assist Sit to supine: Max assist, +2 for physical assistance   General bed mobility comments: Assist for BLE and trunk control required with cues for use of bed rails to assist    Transfers Overall transfer level: Needs assistance   Transfers: Sit to/from Stand Sit to Stand: Mod assist, +2 physical assistance, From elevated surface           General transfer comment: Pt able to stand with +2 mod A from elevated EOB while holding onto sink counter    Ambulation/Gait               General Gait Details: Unable to advance either LE   Stairs  Wheelchair Mobility    Modified Rankin (Stroke Patients Only)       Balance Overall balance  assessment: Needs assistance   Sitting balance-Leahy Scale: Fair     Standing balance support: Bilateral upper extremity supported Standing balance-Leahy Scale: Poor                              Cognition Arousal/Alertness: Lethargic Behavior During Therapy: Flat affect Overall Cognitive Status: No family/caregiver present to determine baseline cognitive functioning                                          Exercises Total Joint Exercises Ankle Circles/Pumps: Strengthening, Both, 10 reps (with manual resistance) Quad Sets: Strengthening, Both, 10 reps Hip ABduction/ADduction: AAROM, Strengthening, Both, 5 reps Straight Leg Raises: AAROM, Strengthening, Both, 5 reps Long Arc Quad: AROM, Strengthening, Both, 10 reps Knee Flexion: AROM, Strengthening, Both, 10 reps    General Comments        Pertinent Vitals/Pain Pain Assessment Pain Assessment: No/denies pain    Home Living                          Prior Function            PT Goals (current goals can now be found in the care plan section) Progress towards PT goals: Progressing toward goals    Frequency    Min 2X/week      PT Plan Current plan remains appropriate    Co-evaluation              AM-PAC PT "6 Clicks" Mobility   Outcome Measure  Help needed turning from your back to your side while in a flat bed without using bedrails?: A Lot Help needed moving from lying on your back to sitting on the side of a flat bed without using bedrails?: A Lot Help needed moving to and from a bed to a chair (including a wheelchair)?: Total Help needed standing up from a chair using your arms (e.g., wheelchair or bedside chair)?: A Lot Help needed to walk in hospital room?: Total Help needed climbing 3-5 steps with a railing? : Total 6 Click Score: 9    End of Session Equipment Utilized During Treatment: Oxygen Activity Tolerance: Patient tolerated treatment well Patient  left: in bed;with call bell/phone within reach;with bed alarm set Nurse Communication: Mobility status PT Visit Diagnosis: Difficulty in walking, not elsewhere classified (R26.2);Muscle weakness (generalized) (M62.81)     Time: [id number] PT Time Calculation (min) (ACUTE ONLY): 31 min  Charges:  $Therapeutic Exercise: 8-22 mins $Therapeutic Activity: 8-22 mins                    D. Scott Shaily Librizzi PT, DPT 02/11/23, 3:41 PM

## 2023-02-12 DIAGNOSIS — G928 Other toxic encephalopathy: Secondary | ICD-10-CM | POA: Diagnosis not present

## 2023-02-12 DIAGNOSIS — J9601 Acute respiratory failure with hypoxia: Secondary | ICD-10-CM | POA: Diagnosis not present

## 2023-02-12 DIAGNOSIS — I509 Heart failure, unspecified: Secondary | ICD-10-CM | POA: Diagnosis not present

## 2023-02-12 DIAGNOSIS — Z7189 Other specified counseling: Secondary | ICD-10-CM | POA: Diagnosis not present

## 2023-02-12 DIAGNOSIS — I5043 Acute on chronic combined systolic (congestive) and diastolic (congestive) heart failure: Secondary | ICD-10-CM | POA: Diagnosis not present

## 2023-02-12 DIAGNOSIS — N179 Acute kidney failure, unspecified: Secondary | ICD-10-CM | POA: Diagnosis not present

## 2023-02-12 DIAGNOSIS — R579 Shock, unspecified: Secondary | ICD-10-CM | POA: Diagnosis not present

## 2023-02-12 LAB — HEPATIC FUNCTION PANEL
ALT: 23 U/L (ref 0–44)
AST: 26 U/L (ref 15–41)
Albumin: 2.4 g/dL — ABNORMAL LOW (ref 3.5–5.0)
Alkaline Phosphatase: 105 U/L (ref 38–126)
Bilirubin, Direct: 0.5 mg/dL — ABNORMAL HIGH (ref 0.0–0.2)
Indirect Bilirubin: 1.1 mg/dL — ABNORMAL HIGH (ref 0.3–0.9)
Total Bilirubin: 1.6 mg/dL — ABNORMAL HIGH (ref 0.3–1.2)
Total Protein: 5.6 g/dL — ABNORMAL LOW (ref 6.5–8.1)

## 2023-02-12 LAB — RENAL FUNCTION PANEL
Albumin: 2.4 g/dL — ABNORMAL LOW (ref 3.5–5.0)
Anion gap: 8 (ref 5–15)
BUN: 44 mg/dL — ABNORMAL HIGH (ref 8–23)
CO2: 28 mmol/L (ref 22–32)
Calcium: 8.9 mg/dL (ref 8.9–10.3)
Chloride: 100 mmol/L (ref 98–111)
Creatinine, Ser: 1.82 mg/dL — ABNORMAL HIGH (ref 0.61–1.24)
GFR, Estimated: 38 mL/min — ABNORMAL LOW (ref >60)
Glucose, Bld: 119 mg/dL — ABNORMAL HIGH (ref 70–99)
Phosphorus: 3.3 mg/dL (ref 2.5–4.6)
Potassium: 3.9 mmol/L (ref 3.5–5.1)
Sodium: 136 mmol/L (ref 135–145)

## 2023-02-12 LAB — MAGNESIUM: Magnesium: 2 mg/dL (ref 1.7–2.4)

## 2023-02-12 LAB — CBC WITH DIFFERENTIAL/PLATELET
Abs Immature Granulocytes: 0.04 10*3/uL (ref 0.00–0.07)
Basophils Absolute: 0 10*3/uL (ref 0.0–0.1)
Basophils Relative: 0 %
Eosinophils Absolute: 0 10*3/uL (ref 0.0–0.5)
Eosinophils Relative: 0 %
HCT: 39.8 % (ref 39.0–52.0)
Hemoglobin: 12 g/dL — ABNORMAL LOW (ref 13.0–17.0)
Immature Granulocytes: 0 %
Lymphocytes Relative: 7 %
Lymphs Abs: 0.7 10*3/uL (ref 0.7–4.0)
MCH: 31.4 pg (ref 26.0–34.0)
MCHC: 30.2 g/dL (ref 30.0–36.0)
MCV: 104.2 fL — ABNORMAL HIGH (ref 80.0–100.0)
Monocytes Absolute: 1 10*3/uL (ref 0.1–1.0)
Monocytes Relative: 10 %
Neutro Abs: 8.7 10*3/uL — ABNORMAL HIGH (ref 1.7–7.7)
Neutrophils Relative %: 83 %
Platelets: 158 10*3/uL (ref 150–400)
RBC: 3.82 MIL/uL — ABNORMAL LOW (ref 4.22–5.81)
RDW: 15.3 % (ref 11.5–15.5)
WBC: 10.5 10*3/uL (ref 4.0–10.5)
nRBC: 0 % (ref 0.0–0.2)

## 2023-02-12 NOTE — Plan of Care (Signed)
Patient John [name], disoriented to time, VSS throughout shift.  All meds given on time as ordered.  Diminished lungs, IS encouraged.  Purewick intact.  POC maintained, will continue to monitor.  Problem: Education: Goal: Knowledge of General Education information will improve Description: Including pain rating scale, medication(s)/side effects and non-pharmacologic comfort measures Outcome: Progressing   Problem: Health Behavior/Discharge Planning: Goal: Ability to manage health-related needs will improve Outcome: Progressing   Problem: Clinical Measurements: Goal: Ability to maintain clinical measurements within normal limits will improve Outcome: Progressing Goal: Will remain free from infection Outcome: Progressing Goal: Diagnostic test results will improve Outcome: Progressing Goal: Respiratory complications will improve Outcome: Progressing Goal: Cardiovascular complication will be avoided Outcome: Progressing   Problem: Activity: Goal: Risk for activity intolerance will decrease Outcome: Progressing   Problem: Nutrition: Goal: Adequate nutrition will be maintained Outcome: Progressing   Problem: Coping: Goal: Level of anxiety will decrease Outcome: Progressing   Problem: Elimination: Goal: Will not experience complications related to bowel motility Outcome: Progressing Goal: Will not experience complications related to urinary retention Outcome: Progressing   Problem: Pain Managment: Goal: General experience of comfort will improve Outcome: Progressing   Problem: Safety: Goal: Ability to remain free from injury will improve Outcome: Progressing   Problem: Skin Integrity: Goal: Risk for impaired skin integrity will decrease Outcome: Progressing   Problem: Activity: Goal: Ability to tolerate increased activity will improve Outcome: Progressing   Problem: Respiratory: Goal: Ability to maintain a clear airway and adequate ventilation will improve Outcome:  Progressing   Problem: Role Relationship: Goal: Method of communication will improve Outcome: Progressing   Problem: Safety: Goal: Non-violent Restraint(s) Outcome: Progressing

## 2023-02-12 NOTE — Progress Notes (Signed)
  Progress Note    02/12/2023 4:13 PM 2 Days Post-Op  Subjective:  Pt is 77 year old male with a history of essential HTN, stage IV CKD, chronic diastolic and systolic heart failure with cor pulmonale, moderate tricuspid regurgitation and aortic valve regurgitation, ascending aortic aneurysm, CAD, COPD and chronic hypoxemia baseline 2 L/min supplemental oxygen at home, recurrent episodes of hypoxemia, ruptured left quadricep tendon, thrombocytopenia, hyperkalemia, morbid obesity history of anasarca dyslipidemia, and recent admission for acute CHF exacerbation.   Patient was in need of dialysis access. Vascular Surgery was consulted for placement of a tunneled dialysis Perma Catheter.    Vitals:   02/12/23 1100 02/12/23 1544  BP: 102/69 100/67  Pulse: 92 96  Resp: 20 20  Temp: 97.8 F (36.6 C) 98 F (36.7 C)  SpO2: 94% 93%   Physical Exam: Cardiac:  RRR Lungs: Normal breathing clear on auscultation throughout. Incisions: Right chest incision dressing clean dry and intact. Extremities: Palpable radial/posttibial pulses. Abdomen: Positive bowel sounds, soft, nontender, nondistended. Neurologic: Alert and oriented x 1 name only.  CBC    Component Value Date/Time   WBC 10.5 02/12/2023 0319   RBC 3.82 (L) 02/12/2023 0319   HGB 12.0 (L) 02/12/2023 0319   HCT 39.8 02/12/2023 0319   PLT 158 02/12/2023 0319   MCV 104.2 (H) 02/12/2023 0319   MCH 31.4 02/12/2023 0319   MCHC 30.2 02/12/2023 0319   RDW 15.3 02/12/2023 0319   LYMPHSABS 0.7 02/12/2023 0319   MONOABS 1.0 02/12/2023 0319   EOSABS 0.0 02/12/2023 0319   BASOSABS 0.0 02/12/2023 0319    BMET    Component Value Date/Time   NA 136 02/12/2023 0319   K 3.9 02/12/2023 0319   CL 100 02/12/2023 0319   CO2 28 02/12/2023 0319   GLUCOSE 119 (H) 02/12/2023 0319   BUN 44 (H) 02/12/2023 0319   CREATININE 1.82 (H) 02/12/2023 0319   CALCIUM 8.9 02/12/2023 0319   GFRNONAA 38 (L) 02/12/2023 0319   GFRAA 50 (L) 07/03/2020 0248     INR    Component Value Date/Time   INR 1.3 (H) 01/29/2023 1108     Intake/Output Summary (Last 24 hours) at 02/12/2023 1613 Last data filed at 02/12/2023 1052 Gross per 24 hour  Intake 100 ml  Output 835 ml  Net -735 ml     Assessment/Plan:  77 y.o. male is s/p dialysis permacath placement.  2 Days Post-Op  Patient is healing and recovering as expected.  Dialysis access working well.  Dressing clean dry and intact. Vascular surgery to sign off at this time.  If needed again please reconsult.  DVT prophylaxis:  ASA 81 mg   Drema Pry [facility] 02/12/2023 4:13 PM

## 2023-02-12 NOTE — Progress Notes (Signed)
[name]  ROUNDING NOTE   Subjective:   [name] was admitted to [facility] on 01/29/2023 for Peripheral edema [R60.9] Shock circulatory ([place]) [R57.9] Acute on chronic congestive heart failure, unspecified heart failure type ([place]) [I50.9] Acute renal failure superimposed on chronic kidney disease, unspecified acute renal failure type, unspecified CKD stage ([place]) [N17.9, N18.9]  Patient seen laying in bed No family at bedside No complaints to offer  Dialysis tolerated well yesterday  Objective:  Vital signs in last 24 hours:  Temp:  [97.8 F (36.6 C)-98.8 F (37.1 C)] 97.8 F (36.6 C) (02/28 1100) Pulse Rate:  [92-107] 92 (02/28 1100) Resp:  [15-20] 20 (02/28 1100) BP: (89-121)/(62-84) 102/69 (02/28 1100) SpO2:  [90 %-97 %] 94 % (02/28 1100) Weight:  [78.6 kg] 78.6 kg (02/28 0815)  Weight change: -4.9 kg Filed Weights   02/10/23 1349 02/11/23 1136 02/12/23 0815  Weight: 79 kg 79.3 kg 78.6 kg    Intake/Output: I/O last 3 completed shifts: In: 669.8 [P.O.:540; I.V.:129.8] Out: 1160 [Urine:1160]   Intake/Output this shift:  Total I/O In: -  Out: 275 [Urine:275]  Physical Exam: General: NAD  Head: Normocephalic, atraumatic   Eyes: Anicteric  Lungs:  Basilar crackles, normal effort, 3 L [place] O2  Heart: regular  Abdomen:  Soft, nontender  Extremities: Trace peripheral edema   Neurologic: Alert and oriented x 3  Skin: Left lower leg wound in clean and dry dressing, bilateral SCDs  Access: Rt permcath    Basic Metabolic Panel: Recent Labs  Lab 02/08/23 0633 02/09/23 0600 02/10/23 0619 02/11/23 0551 02/12/23 0319  NA 143 143 142 139 136  K 4.1 3.8 4.0 3.9 3.9  CL 106 105 104 100 100  CO2 '28 30 29 27 28  '$ GLUCOSE 172* 126* 124* 110* 119*  BUN 96* 97* 94* 60* 44*  CREATININE 3.04* 3.13* 3.02* 1.99* 1.82*  CALCIUM 9.5 9.1 9.4 8.9 8.9  MG 2.7* 2.5* 2.7* 2.2 2.0  PHOS 4.5 4.3 3.8 3.1 3.3     Liver Function Tests: Recent Labs  Lab  02/08/23 0633 02/09/23 0600 02/10/23 0619 02/11/23 0551 02/12/23 0319  AST  --   --   --   --  26  ALT  --   --   --   --  23  ALKPHOS  --   --   --   --  105  BILITOT  --   --   --   --  1.6*  PROT  --   --   --   --  5.6*  ALBUMIN 2.9* 2.6* 2.5* 2.4* 2.4*  2.4*    No results for input(s): "LIPASE", "AMYLASE" in the last 168 hours. No results for input(s): "AMMONIA" in the last 168 hours.   CBC: Recent Labs  Lab 02/06/23 0520 02/07/23 0629 02/11/23 0546 02/12/23 0319  WBC 10.2 12.7* 10.5 10.5  NEUTROABS 8.0* 10.2*  --  8.7*  HGB 12.6* 13.2 12.6* 12.0*  HCT 40.4 41.4 40.5 39.8  MCV 103.6* 102.7* 103.8* 104.2*  PLT 73* 83* 149* 158     Cardiac Enzymes: No results for input(s): "CKTOTAL", "CKMB", "CKMBINDEX", "TROPONINI" in the last 168 hours.  BNP: Invalid input(s): "POCBNP"  CBG: Recent Labs  Lab 02/07/23 1916 02/08/23 0006 02/08/23 0203 02/08/23 0406 02/08/23 0729  GLUCAP 126* 112* 125* 19* 134*     Microbiology: Results for orders placed or performed during the hospital encounter of 01/29/23  Culture, Respiratory w Gram Stain  Status: None   Collection Time: 01/29/23  3:45 AM   Specimen: Tracheal Aspirate; Respiratory  Result Value Ref Range Status   Specimen Description   Final    TRACHEAL ASPIRATE Performed at [facility], [address], [place], [place] [address]    Special Requests   Final    NONE Performed at [facility], [address] [place], [place] [address]    Gram Stain   Final    MODERATE WBC PRESENT,BOTH PMN AND MONONUCLEAR NO ORGANISMS SEEN    Culture   Final    RARE Normal respiratory flora-no Staph aureus or Pseudomonas seen Performed at [facility] [address] [place], [place] [address]    Report Status 02/01/2023 FINAL  Final  Culture, blood (Routine X 2) w Reflex to ID Panel     Status: None   Collection Time: 01/29/23  1:10 PM   Specimen: BLOOD  Result Value Ref Range Status    Specimen Description BLOOD BLOOD LEFT ARM  Final   Special Requests   Final    BOTTLES DRAWN AEROBIC AND ANAEROBIC Blood Culture adequate volume   Culture   Final    NO GROWTH 5 DAYS Performed at [facility], [address], [place], [place] [address]    Report Status 02/03/2023 FINAL  Final  MRSA Next Gen by PCR, Nasal     Status: None   Collection Time: 01/29/23  1:47 PM   Specimen: Nasal Mucosa; Nasal Swab  Result Value Ref Range Status   MRSA by PCR Next Gen NOT DETECTED NOT DETECTED Final    Comment: (NOTE) The GeneXpert MRSA Assay (FDA approved for NASAL specimens only), is one component of a comprehensive MRSA colonization surveillance program. It is not intended to diagnose MRSA infection nor to guide or monitor treatment for MRSA infections. Test performance is not FDA approved in patients less than 59 years old. Performed at [facility], [address] [place], [place] [address]   Culture, blood (Routine X 2) w Reflex to ID Panel     Status: None   Collection Time: 01/29/23  2:10 PM   Specimen: BLOOD  Result Value Ref Range Status   Specimen Description BLOOD A-LINE  Final   Special Requests   Final    BOTTLES DRAWN AEROBIC AND ANAEROBIC Blood Culture adequate volume   Culture   Final    NO GROWTH 5 DAYS Performed at [facility], [address] [place], [place] [address]    Report Status 02/03/2023 FINAL  Final    Coagulation Studies: No results for input(s): "LABPROT", "INR" in the last 72 hours.   Urinalysis: No results for input(s): "COLORURINE", "LABSPEC", "PHURINE", "GLUCOSEU", "HGBUR", "BILIRUBINUR", "KETONESUR", "PROTEINUR", "UROBILINOGEN", "NITRITE", "LEUKOCYTESUR" in the last 72 hours.  Invalid input(s): "APPERANCEUR"     Imaging: [place] UE VEIN MAPPING LEFT (PRE-OP AVF)  Result Date: 02/12/2023 CLINICAL DATA:  End-stage renal disease. Please perform left upper extremity venous mapping prior to attempted dialysis  access creation. EXAM: [place] EXTREM UP VEIN MAPPING COMPARISON:  None Available. FINDINGS: LEFT ARTERIES Wrist Radial Artery: Size 2.6 mm Waveform Triphasic Wrist Ulnar Artery: Size 2.0 mm Waveform Triphasic Prox. Forearm Radial Artery: Size 2.4 mm Waveform Triphasic Upper Arm Brachial Artery: Note is made of a high division of the brachial artery at the level of the proximal humerus (representative image 2). LEFT VEINS Forearm Cephalic Vein: Prox 2.7 mm - Depth 4.7 mm; Distal 2.5 mm - Depth 3.6  mm Upper Arm Cephalic Vein: Prox 2.3 mm - Depth 4.1 mm; Distal 2.4 mm - Depth 3.9 mm Upper Arm Basilic Vein: Prox 8.4 mm - Depth 8.5 mm; Distal 4.4 mm - Depth 9.0 mm Upper Arm Radial vein (secondary to proximal division of the brachial artery): Prox 4.1 mm - Depth 9.5 mm; Distal 4.5 mm - Depth 9.0 mm Upper Arm Ulnar vein (secondary to proximal division of the brachial artery): Prox 5.1 mm - Depth 10.6 mm; Distal 3.8 mm - Depth 14.3 mm ADDITIONAL LEFT VEINS Axillary Vein: 18.2 mm Subclavian Vein: Patient: Yes Respiratory Phasicity: Present Internal Jugular Vein: Patent: Yes    Respiratory Phasicity: Present Branches > 2 mm: None though the cephalic vein appears to have 3 separate divisions at the level of the mid/distal forearm (image 49), while the basilic vein has 3 separate divisions at the level of the distal humerus (image 60). IMPRESSION: Left upper extremity venous mapping as detailed above, significant for high division of the brachial artery at the level of the proximal humerus. Electronically Signed   By: Sandi [name] M.D.   On: 02/12/2023 09:32     Medications:    sodium chloride Stopped (02/11/23 0700)     ascorbic acid  500 mg Oral BID   aspirin  81 mg Oral Daily   Chlorhexidine Gluconate Cloth  6 each Topical Q0600   docusate sodium  100 mg Oral BID   feeding supplement  237 mL Oral TID BM   folic acid  1 mg Oral Daily   heparin injection (subcutaneous)  5,000 Units Subcutaneous Q8H   metoprolol  tartrate  12.5 mg Oral BID   multivitamin with minerals  1 tablet Oral Daily   mouth rinse  15 mL Mouth Rinse 4 times per day   thiamine  100 mg Oral Daily   diclofenac Sodium, docusate sodium, mouth rinse, oxyCODONE, polyethylene glycol  Assessment/ Plan:  [name] is a 77 y.o. black male with chronic diastolic congestive heart failure, hypertension, COPD, hyperlipidemia, who is admitted to [facility] on 01/29/2023 for Peripheral edema [R60.9] Shock circulatory ([employer]) [R57.9] Acute on chronic congestive heart failure, unspecified heart failure type ([place]) [I50.9] Acute renal failure superimposed on chronic kidney disease, unspecified acute renal failure type, unspecified CKD stage ([place]) [N17.9, N18.9]  Acute Kidney Injury on chronic kidney disease stage IV: baseline creatinine of 2.67, GFR of 24 on 01/06/23. History of bland urine. Acute kidney injury most likely secondary to cardiorenal syndrome. Chronic kidney disease secondary to hypertension. Required CRRT from 2/16 - 2/20. Intermitted hemodialysis on 2/21 with hypotension and thrombosis. Did not tolerate treatment. The next day, patient said he did not want dialysis and his dialysis catheter was removed. However on 2/23, patient says he is open to dialysis if necessary.  Dialysis received yesterday, UF 0.  Will hold dialysis for now to determine renal recovery.  Greatly improved mentation. Diuretics as needed Continue to monitor volume status, urine output, renal function and serum electrolytes.  Palliative care consulted and patient once again stated he did not want to continue dialysis, however decided to continue.   Acute exacerbation of chronic diastolic congestive heart failure and acute respiratory failure requiring intubation and mechanical ventilation.  Extubated on 02/05/23. Home regimen was torsemide '60mg'$  daily and metolazone 2.'5mg'$  twice a week.   Remains on 3L [place].   Hypertension: with hypotension with cardiogenic shock  required vasopressors on admission. 123/82 - currently on metoprolol for rate control and blood pressure.   Blood  pressure stable today.   LOS: [place] 2/28/202411:59 AM

## 2023-02-12 NOTE — Progress Notes (Signed)
PROGRESS NOTE    John [name]  [id number] DOB: September 03, 1946 DOA: 01/29/2023 PCP: Alisa Graff, FNP   Brief Narrative:  77 year old male with a history of essential hypertension, on hydralazine and Cozaar, stage IV CKD but has not been on dialysis in the past, has chronic diastolic and systolic heart failure with cor pulmonale, moderate tricuspid regurgitation and aortic valve regurgitation, history of a ascending aortic aneurysm, coronary artery disease with atherosclerosis, COPD and chronic hypoxemia baseline 2 L/min supplemental oxygen at home with recurrent episodes of hypoxemia and remote acute exacerbation of COPD with hypoxemia and hypercapnia 2 months ago history of ruptured left quadricep tendon 2 years ago, chronic thrombocytopenia and erythrocytosis recurrent lower extremity cellulitis, recurrent hyperkalemia, morbid obesity history of anasarca dyslipidemia, recent admission for acute CHF exacerbation requiring Lasix drip and BiPAP support, who was brought in by EMS due to worsening altered mental status and circulatory shock.  01/30/23- I met with girlfriend of patient today to review short term medical plan.  He had Echo whith RV dysfunction and concern for PE.  We have started CRRT yesterday with additional interval improvement. He will have CTPE today.  He is weaned down on pressors today on vasor 0.4 and 4 of levophed reduced from 36 this morning.  01/31/23- remains agitated intermittently overnight. Continued issues with clot burden through CRRT circuit. 02/01/23- remains agitated overnight. Requiring numerous ativan pushes. Attempting to wean down sedation. Failed SBT miserably. 02/02/23- issues with CRRT overnight. Will attempt higher UF today. Assess for possible chest tube placement.  02/03/23- No issues with CRRT overnight, currently running without complication.  CT Head obtained overnight and negative for acute process.  Off Precedex this morning, currently somnolent, mental  status currently precluding extubation. Will perform SBT as mental status permits.  Stop Cefepime, checking ammonia. 02/04/23- Remains on CRRT. Pt more awake and tracking.  Tolerated SBT for 12 hrs  02/05/23- Pt successfully extubated 02/06/23-Off vasopressors.  Palliative Care consulted pt stating he DOES NOT want any form of dialysis.  If pt remains stable will transfer service to [facility] 2/23-care assumed by [facility] hospitalist service. 2/24-after discussion between palliative care, patient, patient's girlfriend the decision was made that the patient would remain DNR status however was interested in restarting hemodialysis.  Nephrology aware.  Kidney function continues to deteriorate. 2/25-repeated discussions with the patient.  He still seems somewhat unsure about dialysis however at this point is agreeing to move forward with PermCath placement.  Vascular surgery made aware.  Patient consented for surgery 2/26. 2/26-PermCath placed today.  Initiating hemodialysis. 2/27-status post hemodialysis today.  Doing reasonably well.  Mental status improved.  Is pretty clear with [place] that he does not want hemodialysis.  **2/28 -patient has gone back and forth about wanting dialysis and not wanting dialysis.  He told the palliative team this morning that he did not want any further dialysis with him: Back to room states that he wants everything done now.  Nephrology is going to hold dialysis today and tomorrow and evaluate for Friday to see if he needs it given that he had dialysis in the last 2 days.  The dialysis catheter was placed 2 days ago is working well and patient is healing recovering as expected per the vascular team and they are recommending outpatient follow-up.  Assessment and Plan: No notes have been filed under this hospital service. Service: Hospitalist  Circulatory shock  Acute decompensated HFpEF and right-sided heart failure n -RV dysfunction and dilation on echocardiography, concerning for RV  failure and pulmonary hypertension.  -Porto-pulmonary hypertension on the differential given history of liver cirrhosis.  -Was on CVVH for ultra-filtration for volume removal, now refusing HD. goal MAP > 65. -Patient agreeable to restarting hemodialysis now.  Cardiology has signed off.  No further diagnostics from their standpoint. -Neurology recommending holding next 2 days and reevaluating on Friday, 02/14/2023   Acute Hypoxic Respiratory Failure -Successfully extubated to nasal cannula on 2/22.   -Doing well overall.  Remains on nasal cannula at 3 to 4 L. Monitor oxygen and continue to wean as tolerated -SpO2: 93 % O2 Flow Rate (L/min): 3 L/min FiO2 (%): 30 % -Lasix challenge 100 mg IV x 1 on 2/24 -Patient on 3L 2/27.  Will hold further Lasix at this time and a maintenance dialysis and receive dialysis last few days and will hold dialysis the next today and tomorrow and reevaluate Friday   Toxic Metabolic Encephalopathy -Extubated successfully.  Mental status approaching baseline.   -Mental status and in the setting of renal failure, psychotropic medications.   -Waxing and waning   Acute kidney injury on chronic kidney disease stage IV -Was on CRRT given hypotension.  Now agreeable to restart hemodialysis.   -Kidney function continued to deteriorate so PermCath placed.   -First HD 2/26, second HD run 2/27.   -Dialysis coordinator to start looking for outpatient HD chair.   -Unclear whether this is within the patient's wishes.  Nephrology to wait 2 days prior to considering restarting hemodialysis and holding dialysis today and tomorrow to monitor to see if he has any recovery  -BUN/Cr Trend: Recent Labs  Lab 02/06/23 0520 02/07/23 0629 02/08/23 0633 02/09/23 0600 02/10/23 0619 02/11/23 0551 02/12/23 0319  BUN 89* 93* 96* 97* 94* 60* 44*  CREATININE 2.81* 2.87* 3.04* 3.13* 3.02* 1.99* 1.82*  -Avoid Nephrotoxic Medications, Contrast Dyes, Hypotension and Dehydration to Ensure  Adequate Renal Perfusion and will need to Renally Adjust Meds -Continue to Monitor and Trend Renal Function carefully and repeat CMP in the AM    Hepatic cirrhosis -Empirically given Cefepime due to Leukocytosis which has resolved -Hepatic Fxn Trend: Recent Labs  Lab 01/29/23 0702 01/30/23 1612 02/03/23 0520 02/12/23 0319  AST 36 '23 17 26  '$ ALT '13 12 10 23  '$ BILITOT 2.1* 4.3* 1.6* 1.6*  ALKPHOS 75 50 50 105  -Continue to monitor and trend intermittently and repeat CMP in the morning  Thrombocytopenia -No acute issues.  Does confer a poor prognosis.  Was on cefepime empirically given leukocytosis.  -Platelet Count Trend: Recent Labs  Lab 02/03/23 0956 02/04/23 0400 02/05/23 0411 02/06/23 0520 02/07/23 0629 02/11/23 0546 02/12/23 0319  PLT 38* 38* 57* 73* 83* 149* 158  -Continue to Monitor for S/Sx of Bleeding; No overt bleeding noted -Repeat CBC in the AM    Macrocytic Anemia -Hgb/Hct Trend: Recent Labs  Lab 02/03/23 0520 02/03/23 0956 02/04/23 0400 02/05/23 0411 02/06/23 0520 02/07/23 0629 02/11/23 0546  HGB 13.2 14.6 13.6 13.5 12.6* 13.2 12.6*  HCT 42.7 46.4 43.0 42.7 40.4 41.4 40.5  MCV 104.4* 103.1* 101.7* 103.4* 103.6* 102.7* 103.8*  -Check Anemia Panel in the AM -Continue to Monitor for S/Sx of bleeding; No overt bleeding noted -Repeat CBC in the AM   Hypoalbuminemia -Patient's Albumin Trend: Recent Labs  Lab 02/06/23 0520 02/07/23 0629 02/08/23 0633 02/09/23 0600 02/10/23 0619 02/11/23 0551 02/12/23 0319  ALBUMIN 2.6* 2.9* 2.9* 2.6* 2.5* 2.4* 2.4*  -Continue to Monitor and Trend and repeat CMP in the AM  DVT prophylaxis:  heparin injection 5,000 Units Start: 02/06/23 1400 Place and maintain sequential compression device Start: 02/04/23 1018 SCDs Start: 01/29/23 1006    Code Status: DNR Family Communication: No family present at bedside   Disposition Plan:  Level of care: Progressive Status is: Inpatient Remains inpatient appropriate  because: Needs further clinical improvement and clearance by Specialists    Consultants:  Palliative Care Vascular Surgery Nephrology  Procedures:  As delineated as above   Antimicrobials:  Anti-infectives (From admission, onward)    Start     Dose/Rate Route Frequency Ordered Stop   02/11/23 0000  ceFAZolin (ANCEF) IVPB 1 g/50 mL premix       Note to Pharmacy: Send with pt to OR   1 g 100 mL/hr over 30 Minutes Intravenous On call 02/10/23 0908 02/10/23 0904   01/31/23 1700  ceFEPIme (MAXIPIME) 2 g in sodium chloride 0.9 % 100 mL IVPB  Status:  Discontinued        2 g 200 mL/hr over 30 Minutes Intravenous Every 12 hours 01/31/23 1555 02/03/23 1048   01/30/23 1700  vancomycin (VANCOREADY) IVPB 1250 mg/250 mL  Status:  Discontinued        1,250 mg 166.7 mL/hr over 90 Minutes Intravenous Every 24 hours 01/29/23 1543 01/30/23 1036   01/30/23 1300  Ampicillin-Sulbactam (UNASYN) 3 g in sodium chloride 0.9 % 100 mL IVPB  Status:  Discontinued        3 g 200 mL/hr over 30 Minutes Intravenous Every 8 hours 01/30/23 1036 01/31/23 1118   01/30/23 0000  vancomycin variable dose per unstable renal function (pharmacist dosing)  Status:  Discontinued         Does not apply See admin instructions 01/29/23 1125 01/29/23 1548   01/29/23 2200  piperacillin-tazobactam (ZOSYN) IVPB 3.375 g  Status:  Discontinued        3.375 g 12.5 mL/hr over 240 Minutes Intravenous Every 6 hours 01/29/23 1547 01/29/23 1548   01/29/23 2200  piperacillin-tazobactam (ZOSYN) IVPB 3.375 g  Status:  Discontinued        3.375 g 100 mL/hr over 30 Minutes Intravenous Every 6 hours 01/29/23 1548 01/30/23 1036   01/29/23 2100  piperacillin-tazobactam (ZOSYN) IVPB 2.25 g  Status:  Discontinued        2.25 g 100 mL/hr over 30 Minutes Intravenous Every 8 hours 01/29/23 1125 01/29/23 1547   01/29/23 1115  piperacillin-tazobactam (ZOSYN) IVPB 3.375 g        3.375 g 100 mL/hr over 30 Minutes Intravenous  Once 01/29/23 1103  01/29/23 1457   01/29/23 1115  vancomycin (VANCOREADY) IVPB 2000 mg/400 mL        2,000 mg 200 mL/hr over 120 Minutes Intravenous  Once 01/29/23 1104 01/29/23 1656       Subjective: Seen and examined at bedside and was little frustrated.  States that the kidney doctors came by this morning and states that the plans is to watch.  No nausea or vomiting.  Denies any complaints but states he feels about the same and feels a little short of breath.  No other concerns or complaints this time.  Objective: Vitals:   02/11/23 2015 02/11/23 2346 02/12/23 0532 02/12/23 0815  BP: 1'11/73 91/71 91/66 '$ (!) 89/62  Pulse: (!) 103 100 92 97  Resp: '16 18 16 17  '$ Temp: 98.2 F (36.8 C) 98.1 F (36.7 C) 97.8 F (36.6 C) 98.8 F (37.1 C)  TempSrc: Oral  Oral Oral  SpO2: 93% 90% 94% 95%  Weight:  78.6 kg  Height:        Intake/Output Summary (Last 24 hours) at 02/12/2023 0917 Last data filed at 02/12/2023 0344 Gross per 24 hour  Intake 100 ml  Output 660 ml  Net -560 ml   Filed Weights   02/10/23 1349 02/11/23 1136 02/12/23 0815  Weight: 79 kg 79.3 kg 78.6 kg   Examination: Physical Exam:  Constitutional: Chronically ill-appearing African-American male who is in no acute distress appears slightly uncomfortable Respiratory: Diminished to auscultation bilaterally with coarse breath sounds, no wheezing, rales, rhonchi or crackles. Normal respiratory effort and patient is not tachypenic. No accessory muscle use.  Unlabored breathing and not wearing supplemental oxygen via nasal cannula Cardiovascular: RRR, no murmurs / rubs / gallops. S1 and S2 auscultated. No extremity edema.  Abdomen: Soft, non-tender, non-distended. Bowel sounds positive.  GU: Deferred. Musculoskeletal: No clubbing / cyanosis of digits/nails. No joint deformity upper and lower extremities.  Skin: No rashes, lesions, ulcers limited skin evaluation. No induration; Warm and dry.  Neurologic: CN 2-12 grossly intact with no focal  deficits. Romberg sign cerebellar reflexes not assessed.   Data Reviewed: I have personally reviewed following labs and imaging studies  CBC: Recent Labs  Lab 02/06/23 0520 02/07/23 0629 02/11/23 0546  WBC 10.2 12.7* 10.5  NEUTROABS 8.0* 10.2*  --   HGB 12.6* 13.2 12.6*  HCT 40.4 41.4 40.5  MCV 103.6* 102.7* 103.8*  PLT 73* 83* [id number]*   Basic Metabolic Panel: Recent Labs  Lab 02/08/23 0633 02/09/23 0600 02/10/23 0619 02/11/23 0551 02/12/23 0319  NA 143 143 142 139 136  K 4.1 3.8 4.0 3.9 3.9  CL 106 105 104 100 100  CO2 '28 30 29 27 28  '$ GLUCOSE 172* 126* 124* 110* 119*  BUN 96* 97* 94* 60* 44*  CREATININE 3.04* 3.13* 3.02* 1.99* 1.82*  CALCIUM 9.5 9.1 9.4 8.9 8.9  MG 2.7* 2.5* 2.7* 2.2 2.0  PHOS 4.5 4.3 3.8 3.1 3.3   GFR: Estimated Creatinine Clearance: 36.2 mL/min (A) (by C-G formula based on SCr of 1.82 mg/dL (H)). Liver Function Tests: Recent Labs  Lab 02/08/23 [id number] 02/09/23 0600 02/10/23 0619 02/11/23 0551 02/12/23 0319  ALBUMIN 2.9* 2.6* 2.5* 2.4* 2.4*   No results for input(s): "LIPASE", "AMYLASE" in the last 168 hours. Recent Labs  Lab 02/05/23 1021  AMMONIA 22   Coagulation Profile: No results for input(s): "INR", "PROTIME" in the last 168 hours. Cardiac Enzymes: No results for input(s): "CKTOTAL", "CKMB", "CKMBINDEX", "TROPONINI" in the last 168 hours. BNP (last 3 results) No results for input(s): "PROBNP" in the last 8760 hours. HbA1C: No results for input(s): "HGBA1C" in the last 72 hours. CBG: Recent Labs  Lab 02/07/23 1916 02/08/23 0006 02/08/23 0203 02/08/23 0406 02/08/23 0729  GLUCAP 126* 112* 125* 124* 134*   Lipid Profile: No results for input(s): "CHOL", "HDL", "LDLCALC", "TRIG", "CHOLHDL", "LDLDIRECT" in the last 72 hours. Thyroid Function Tests: No results for input(s): "TSH", "T4TOTAL", "FREET4", "T3FREE", "THYROIDAB" in the last 72 hours. Anemia Panel: No results for input(s): "VITAMINB12", "FOLATE", "FERRITIN", "TIBC",  "IRON", "RETICCTPCT" in the last 72 hours. Sepsis Labs: No results for input(s): "PROCALCITON", "LATICACIDVEN" in the last 168 hours.  No results found for this or any previous visit (from the past 240 hour(s)).   Radiology Studies: No results found.   Scheduled Meds:  ascorbic acid  500 mg Oral BID   aspirin  81 mg Oral Daily   Chlorhexidine Gluconate Cloth  6 each Topical Q0600   docusate  sodium  100 mg Oral BID   feeding supplement  237 mL Oral TID BM   folic acid  1 mg Oral Daily   heparin injection (subcutaneous)  5,000 Units Subcutaneous Q8H   metoprolol tartrate  12.5 mg Oral BID   multivitamin with minerals  1 tablet Oral Daily   mouth rinse  15 mL Mouth Rinse 4 times per day   thiamine  100 mg Oral Daily   Continuous Infusions:  sodium chloride Stopped (02/11/23 0700)    LOS: 14 days   Raiford Noble, DO [facility] Available via Epic secure chat 7am-7pm After these hours, please refer to coverage provider listed on [email or web address] 02/12/2023, 9:17 AM

## 2023-02-12 NOTE — Progress Notes (Signed)
Occupational Therapy Treatment Patient Details Name: John [name] MRN: [id number] DOB: 13-Oct-1946 Today's Date: 02/12/2023   History of present illness Pt is 77 year old male with a history of essential HTN, stage IV CKD, chronic diastolic and systolic heart failure with cor pulmonale, moderate tricuspid regurgitation and aortic valve regurgitation, ascending aortic aneurysm, CAD, COPD and chronic hypoxemia baseline 2 L/min supplemental oxygen at home, recurrent episodes of hypoxemia, ruptured left quadricep tendon, thrombocytopenia, hyperkalemia, morbid obesity history of anasarca dyslipidemia, and recent admission for acute CHF exacerbation who was brought in by EMS due to worsening altered mental status and circulatory shock.  MD assessment includes: Acute hypoxic respiratory failure in setting of pulmonary edema, pulmonary hypertension, & small bilateral pleural effusions, AKI, thrombocytopenia, leukocytosis, and acute metabolic encephalopathy vs possible hepatic encephalopathy.   OT comments  Pt with decreased motivation today; asking only for repositioning in bed. Pt not very verbose about his reasoning today.   Recommendations for follow up therapy are one component of a multi-disciplinary discharge planning process, led by the attending physician.  Recommendations may be updated based on patient status, additional functional criteria and insurance authorization.    Follow Up Recommendations  Skilled nursing-short term rehab (<3 hours/day)     Assistance Recommended at Discharge Frequent or constant Supervision/Assistance  Patient can return home with the following  Two people to help with walking and/or transfers;Two people to help with bathing/dressing/bathroom;Help with stairs or ramp for entrance;Assist for transportation;Assistance with cooking/housework   Equipment Recommendations  Other (comment) (defer)    Recommendations for Other Services      Precautions / Restrictions  Precautions Precautions: Fall Restrictions Weight Bearing Restrictions: No Other Position/Activity Restrictions: Tunneled hemodialysis catheter via the right internal jugular vein placed 2/26       Mobility Bed Mobility Overal bed mobility: Needs Assistance Bed Mobility: Rolling Rolling: Min assist              Transfers                         Balance                                           ADL either performed or assessed with clinical judgement   ADL                                         General ADL Comments: Pt declining ADLs at this time. Wanting only to reposition in bed. MIN A for repositioning in bed at this time, rolling to the R side; positioned dependently with pillows.    Extremity/Trunk Assessment              Vision       Perception     Praxis      Cognition Arousal/Alertness: Lethargic Behavior During Therapy: Flat affect Overall Cognitive Status: Difficult to assess                                 General Comments: Pt very flat; not wanting to answer a lot of questions.        Exercises      Shoulder Instructions       General Comments  Visitor in room at beside and supportive    Pertinent Vitals/ Pain       Pain Assessment Pain Assessment: No/denies pain  Home Living                                          Prior Functioning/Environment              Frequency  Min 2X/week        Progress Toward Goals  OT Goals(current goals can now be found in the care plan section)  Progress towards OT goals: Not progressing toward goals - comment (pt with decreased motivation today)  Acute Rehab OT Goals Patient Stated Goal: Rest OT Goal Formulation: With patient Time For Goal Achievement: 02/20/23 Potential to Achieve Goals: Good ADL Goals Pt Will Perform Grooming: with modified independence;sitting Pt Will Perform Lower Body Dressing:  with min assist;sit to/from stand Pt Will Transfer to Toilet: with min assist;ambulating Pt Will Perform Toileting - Clothing Manipulation and hygiene: with min assist;sit to/from stand  Plan Discharge plan remains appropriate    Co-evaluation                 AM-PAC OT "6 Clicks" Daily Activity     Outcome Measure   Help from another person eating meals?: A Little Help from another person taking care of personal grooming?: A Little Help from another person toileting, which includes using toliet, bedpan, or urinal?: A Lot Help from another person bathing (including washing, rinsing, drying)?: A Lot Help from another person to put on and taking off regular upper body clothing?: A Little Help from another person to put on and taking off regular lower body clothing?: A Lot 6 Click Score: 15    End of Session    OT Visit Diagnosis: Unsteadiness on feet (R26.81);Repeated falls (R29.6);Muscle weakness (generalized) (M62.81)   Activity Tolerance Patient limited by fatigue   Patient Left in bed;with call bell/phone within reach;with bed alarm set;with family/visitor present   Nurse Communication Mobility status        Time: [id number] OT Time Calculation (min): 8 min  Charges: OT General Charges $OT Visit: 1 Visit OT Treatments $Therapeutic Activity: 8-22 mins  Waymon Amato, MS, OTR/L   Vania Rea 02/12/2023, 4:33 PM

## 2023-02-12 NOTE — TOC Progression Note (Addendum)
Transition of Care [facility]) - Progression Note    Patient Details  Name: John [name] MRN: [id number] Date of Birth: Jan 11, 1946  Transition [facility] Care [facility]) CM/SW Contact  Laurena Slimmer, RN Phone Number: 02/12/2023, 9:38 AM  Clinical Narrative:    Spoke with patient's friend Tawni Carnes regarding therapy's recommendation for SNF. She is agreeable although reluctant to SNF. "He didn't get any therapy the last time he was at [facility]." She was advised she would have a choice of facilities and could select the one of her chosing.  She inquired about local facilites and was provided information for  [facility], [facility], [facility]. She would like to discuss these facilities with her family prior to making a choice.     Expected Discharge Plan:  (TBD) Barriers to Discharge: Continued Medical Work up  Expected Discharge Plan and Services   Discharge Planning Services: CM Consult   Living arrangements for the past 2 months: Single Family Home                 DME Arranged: N/A                     Social Determinants of Health (SDOH) Interventions SDOH Screenings   Food Insecurity: No Food Insecurity (01/29/2023)  Housing: Low Risk  (01/29/2023)  Transportation Needs: No Transportation Needs (01/29/2023)  Utilities: Not At Risk (01/29/2023)  Alcohol Screen: Low Risk  (07/17/2022)  Depression (PHQ2-9): Low Risk  (07/30/2022)  Financial Resource Strain: Low Risk  (07/17/2022)  Physical Activity: Sufficiently Active (07/17/2022)  Social Connections: Socially Integrated (07/17/2022)  Stress: No Stress Concern Present (07/17/2022)  Tobacco Use: Medium Risk (02/10/2023)    Readmission Risk Interventions    01/30/2023   11:09 AM  Readmission Risk Prevention Plan  Transportation Screening Complete  Medication Review ([place]) Complete  PCP or Specialist appointment within 3-5 days of discharge Complete  HRI or [place] Complete  SW Recovery Care/Counseling Consult Complete  Palliative Care Screening Complete  [place] Not Applicable

## 2023-02-12 NOTE — Progress Notes (Signed)
Daily Progress Note   Patient Name: John [name]       Date: 02/12/2023 DOB: 19-Apr-1946  Age: 77 y.o. MRN#: [id number] Attending Physician: Kerney Elbe, DO Primary Care Physician: Alisa Graff, FNP Admit Date: 01/29/2023  Reason for Consultation/Follow-up: Establishing goals of care  HPI/Brief Hospital Review: 77 year old male with a history of HTN, stage IV CKD-has not been on dialysis prior to admission, chronic diastolic and systolic heart failure with cor pulmonale, moderate tricuspid regurgitation and aortic valve regurgitation, history of ascending aortic aneurysm, CAD with atherosclerosis, COPD and chronic hypoxemia baseline 2 L/min supplemental oxygen at home, chronic thrombocytopenia and erythrocytosis, recurrent lower extremity cellulitis, recurrent hyperkalemia with a recent admission for acute CHF exacerbation requiring Lasix drip and BiPAP support, who was brought in by EMS on 01/29/2023 due to worsening altered mental status and circulatory shock.     Required intubation, CRRT and pressor support this admission. Successfully extubated 2/21.  Ongoing fluctuations in his decision to continue dialysis or decide against continuing   Palliative Medicine consulted for assisting with goals of care conversations.  Subjective: Extensive chart review has been completed prior to meeting patient including labs, vital signs, imaging, progress notes, orders, and available advanced directive documents from current and previous encounters.    Visited with Mr. Whisenhunt at his bedside. Requesting to be "left alone" and not wanting to participate with speech therapy-declines assistance with eating his breakfast. With initial conversations Mr. Mcateer clearly indicates his desire to stop  dialysis and further aggressive medical interventions and to be made comfortable and allow a natural death. Called back into room as Mr. Serrao has changed his mind.  Spoke with Nancy-long time girlfriend, she clearly states again she does not wish to make health care decisions for Mr. Shrider does not want to assume that responsibility.  He now wishes to continue with dialysis and all interventions that come along-emphasized importance of him being a willing and active participant in his health care-he verbalizes understanding and agrees. He voices not wanting to participate in further goals of care discussions at this time. Emphasized importance of not fluctuating in his decision to ensure his wishes and desires are being honored. Questions his full capacity at understanding complex medical decisions.  PMT to monitor peripherally for ongoing needs as goals are clear at this time.  Objective:  Physical Exam Constitutional:      General: He is not in acute distress.    Appearance: He is ill-appearing.  Pulmonary:     Effort: Pulmonary effort is normal. No respiratory distress.  Skin:    General: Skin is warm and dry.  Neurological:     Mental Status: He is alert.             Vital Signs: BP 102/69 (BP Location: Left Arm)   Pulse 92   Temp 97.8 F (36.6 C) (Oral)   Resp 20   Ht '5\' 11"'$  (1.803 m)   Wt 78.6 kg   SpO2 94%   BMI 24.17 kg/m  SpO2: SpO2: 94 % O2 Device: O2 Device: Nasal Cannula O2 Flow Rate: O2 Flow Rate (L/min): 3 L/min   Palliative Care Assessment & Plan   Assessment/Recommendation/Plan  DNR Continue full scope of interventions including dialysis PMT to follow peripherally for needs as they arise as goals are clear at this time  Care plan was discussed with primary team and nephrology team.  Thank you for allowing the Palliative Medicine Team to assist in the care of this patient.  Total time:  50 minutes  Time spent includes: Detailed review of  medical records (labs, vital signs), medically appropriate exam (mental status, respiratory, cardiac, skin), discussed with treatment team, counseling and educating patient, family and staff, documenting clinical information, medication management and coordination of care.  Theodoro Grist, DNP, AGNP-C Palliative Medicine   Please contact Palliative Medicine Team phone at [phone] for questions and concerns.

## 2023-02-12 NOTE — Progress Notes (Addendum)
Speech Language Pathology Treatment: Dysphagia  Patient Details Name: John [name] MRN: [id number] DOB: [birth year] Today's Date: 02/12/2023 Time: [id number] SLP Time Calculation (min) (ACUTE ONLY): 30 min  Assessment / Plan / Recommendation Clinical Impression  Pt seen today for diet tolerance. Pt laying in bed sleeping upon SLP arrival and woke quickly to rousing. Pt repositioned to sit up in bed. Palliative care spoke w/ pt during tx session. Pt repeatedly denied his breakfast. Pt left sitting up in bed w/ call button in reach, bed alarm set, and nursing present. OF NOTE: Palliative care spoke w/ pt during session. Pt expressed his wants stating, "I just want to be left alone," and adamantly responding "no" when asked if he wanted breakfast. Palliative care further discussed his lack of desire to eat at this time. Pt later clarified to palliative he would like to continue dialysis. Pt on RA; afebrile; WBC WNL.  Pt does not appear to present w/ any overt s/s of oropharyngeal dysphagia. Pt's oropharyngeal swallow was FUNCTIONAL for pt's needs. Pt repeated denied breakfast foods despite encouragement, but was agreeable to sips of thin liquids (~2 oz via straw) feeding self. Pt did not exhibit any overt s/s of aspiration w/ thin liquids such as coughing, throat clearing, nor wet vocal quality. Per yesterday's dysphagia tx note, pt does not exhibit any Functional deficits w/ solids during oral and pharyngeal phases -- "During oral phase noted adequate mastication, timely A-P bolus transit, and clear oral cavity post-po's... During pharyngeal phase, pt did not appear to exhibit any overt s/s of aspiration."  Per pt's request, his breakfast tray was removed and left w/ juice and sweet tea on bedside table.  Recommend continue regular diet (tough meats and spaghetti chopped) w/ thin liquids. Recommend pills crushed w/ puree. Follow General aspiration precautions w/ all po's. Recommend assistance w/ set up  and feeding as need (in setting of deconditioning/weakness).  In setting of pt's wishes for more palliative approach at this time and pt's toleration of current diet, ST services will s/o w/ MD to reconsult if any new needs arise during admit. Pt education completed. Precautions posted in room. Pt/Nursing updated.   HPI HPI: Per H&P, pt is 77 year old male with a history of essential hypertension, on hydralazine and Cozaar, stage IV CKD but has not been on dialysis in the past, has chronic diastolic and systolic heart failure with cor pulmonale, moderate tricuspid regurgitation and aortic valve regurgitation, history of a ascending aortic aneurysm, coronary artery disease with atherosclerosis, COPD and chronic hypoxemia baseline 2 L/min supplemental oxygen at home with recurrent episodes of hypoxemia and remote acute exacerbation of COPD with hypoxemia and hypercapnia 2 months ago history of ruptured left quadricep tendon 2 years ago, chronic thrombocytopenia and erythrocytosis recurrent lower extremity cellulitis, recurrent hyperkalemia, morbid obesity history of anasarca dyslipidemia, recent admission for acute CHF exacerbation requiring Lasix drip and BiPAP support, who was brought in by EMS due to worsening altered mental status and circulatory shock.  I was able to meet with wife at bedside during my evaluation who shares patient has been sleeping majority of each day over the last week has been on arousable and received phone call from medical provider regarding worsening kidney function which prompted ER evaluation.  He had chest x-ray performed while in the ER showing bilateral pleural effusions with vascular congestion and atelectasis/infiltrate.  Patient in circulatory shock with Levophed support on admission in ER.  PCCM consultation for admission to medical intensive care unit with circulatory shock consistent  with acute on chronic renal failure with CHF exacerbation. Pt inubated on 2/14 and  extubated on 2/21.      SLP Plan  All goals met      Recommendations for follow up therapy are one component of a multi-disciplinary discharge planning process, led by the attending physician.  Recommendations may be updated based on patient status, additional functional criteria and insurance authorization.    Recommendations  Diet recommendations: Regular;Thin liquid (Chop tough meats and spaghetti) Liquids provided via: Straw Medication Administration: Crushed with puree Supervision: Patient able to self feed;Staff to assist with self feeding;Intermittent supervision to cue for compensatory strategies Compensations: Minimize environmental distractions;Slow rate;Small sips/bites;Lingual sweep for clearance of pocketing;Follow solids with liquid Postural Changes and/or Swallow Maneuvers: Out of bed for meals;Seated upright 90 degrees;Upright 30-60 min after meal                Oral Care Recommendations: Oral care BID;Oral care before and after PO;Patient independent with oral care Follow Up Recommendations: Skilled nursing-short term rehab (<3 hours/day) Assistance recommended at discharge: Frequent or constant Supervision/Assistance SLP Visit Diagnosis: Dysphagia, unspecified (R13.10) Plan: All goals met          Randall Hiss Graduate Clinician [place], Speech Pathology   Randall Hiss  02/12/2023, 10:54 AM

## 2023-02-12 NOTE — Progress Notes (Signed)
Physical Therapy Treatment Patient Details Name: John [name] MRN: [id number] DOB: [birth year] Today's Date: 02/12/2023   History of Present Illness Pt is 77 year old male with a history of essential HTN, stage IV CKD, chronic diastolic and systolic heart failure with cor pulmonale, moderate tricuspid regurgitation and aortic valve regurgitation, ascending aortic aneurysm, CAD, COPD and chronic hypoxemia baseline 2 L/min supplemental oxygen at home, recurrent episodes of hypoxemia, ruptured left quadricep tendon, thrombocytopenia, hyperkalemia, morbid obesity history of anasarca dyslipidemia, and recent admission for acute CHF exacerbation who was brought in by EMS due to worsening altered mental status and circulatory shock.  MD assessment includes: Acute hypoxic respiratory failure in setting of pulmonary edema, pulmonary hypertension, & small bilateral pleural effusions, AKI, thrombocytopenia, leukocytosis, and acute metabolic encephalopathy vs possible hepatic encephalopathy.    PT Comments    Pt declined mobility this session secondary to fatigue.  Pt education provided on physiological benefits of activity and agreed to below supine therex but continued to decline mobility.  Pt reported no pain or other concerns other than general fatigue. Pt will benefit from PT services in a SNF setting upon discharge to safely address deficits listed in patient problem list for decreased caregiver assistance and eventual return to PLOF.    Recommendations for follow up therapy are one component of a multi-disciplinary discharge planning process, led by the attending physician.  Recommendations may be updated based on patient status, additional functional criteria and insurance authorization.  Follow Up Recommendations  Skilled nursing-short term rehab (<3 hours/day) Can patient physically be transported by private vehicle: No   Assistance Recommended at Discharge Frequent or constant  Supervision/Assistance  Patient can return home with the following Two people to help with walking and/or transfers;Two people to help with bathing/dressing/bathroom;Assistance with cooking/housework;Direct supervision/assist for medications management;Help with stairs or ramp for entrance;Assist for transportation;Direct supervision/assist for financial management   Equipment Recommendations  Other (comment) (TBD)    Recommendations for Other Services       Precautions / Restrictions Precautions Precautions: Fall Restrictions Weight Bearing Restrictions: No Other Position/Activity Restrictions: Tunneled hemodialysis catheter via the right internal jugular vein placed 2/26     Mobility  Bed Mobility               General bed mobility comments: Pt declined bed mobility tasks secondary to fatigue, agreed to supine therex only    Transfers                        Ambulation/Gait                   Stairs             Wheelchair Mobility    Modified Rankin (Stroke Patients Only)       Balance                                            Cognition Arousal/Alertness: Lethargic Behavior During Therapy: Flat affect Overall Cognitive Status: Difficult to assess                                          Exercises Other Exercises Other Exercises: Supine BLE hip, knee, and ankle AA/PROM to pt's tolerance    General Comments  Pertinent Vitals/Pain Pain Assessment Pain Assessment: No/denies pain    Home Living                          Prior Function            PT Goals (current goals can now be found in the care plan section) Progress towards PT goals: PT to reassess next treatment    Frequency    Min 2X/week      PT Plan Current plan remains appropriate    Co-evaluation              AM-PAC PT "6 Clicks" Mobility   Outcome Measure  Help needed turning from your back  to your side while in a flat bed without using bedrails?: A Lot Help needed moving from lying on your back to sitting on the side of a flat bed without using bedrails?: A Lot Help needed moving to and from a bed to a chair (including a wheelchair)?: Total Help needed standing up from a chair using your arms (e.g., wheelchair or bedside chair)?: A Lot Help needed to walk in hospital room?: Total Help needed climbing 3-5 steps with a railing? : Total 6 Click Score: 9    End of Session Equipment Utilized During Treatment: Oxygen Activity Tolerance: Patient limited by fatigue Patient left: in bed;with call bell/phone within reach;with bed alarm set Nurse Communication: Mobility status PT Visit Diagnosis: Difficulty in walking, not elsewhere classified (R26.2);Muscle weakness (generalized) (M62.81)     Time: [id number] PT Time Calculation (min) (ACUTE ONLY): 15 min  Charges:  $Therapeutic Exercise: 8-22 mins                    D. Scott Shalita Notte PT, DPT 02/12/23, 4:09 PM

## 2023-02-13 DIAGNOSIS — R579 Shock, unspecified: Secondary | ICD-10-CM | POA: Diagnosis not present

## 2023-02-13 LAB — CBC WITH DIFFERENTIAL/PLATELET
Abs Immature Granulocytes: 0.05 10*3/uL (ref 0.00–0.07)
Basophils Absolute: 0 10*3/uL (ref 0.0–0.1)
Basophils Relative: 0 %
Eosinophils Absolute: 0 10*3/uL (ref 0.0–0.5)
Eosinophils Relative: 0 %
HCT: 39.5 % (ref 39.0–52.0)
Hemoglobin: 12.2 g/dL — ABNORMAL LOW (ref 13.0–17.0)
Immature Granulocytes: 1 %
Lymphocytes Relative: 9 %
Lymphs Abs: 0.8 10*3/uL (ref 0.7–4.0)
MCH: 32.1 pg (ref 26.0–34.0)
MCHC: 30.9 g/dL (ref 30.0–36.0)
MCV: 103.9 fL — ABNORMAL HIGH (ref 80.0–100.0)
Monocytes Absolute: 0.7 10*3/uL (ref 0.1–1.0)
Monocytes Relative: 8 %
Neutro Abs: 7.7 10*3/uL (ref 1.7–7.7)
Neutrophils Relative %: 82 %
Platelets: 159 10*3/uL (ref 150–400)
RBC: 3.8 MIL/uL — ABNORMAL LOW (ref 4.22–5.81)
RDW: 14.8 % (ref 11.5–15.5)
WBC: 9.4 10*3/uL (ref 4.0–10.5)
nRBC: 0 % (ref 0.0–0.2)

## 2023-02-13 LAB — COMPREHENSIVE METABOLIC PANEL
ALT: 21 U/L (ref 0–44)
AST: 26 U/L (ref 15–41)
Albumin: 2.4 g/dL — ABNORMAL LOW (ref 3.5–5.0)
Alkaline Phosphatase: 108 U/L (ref 38–126)
Anion gap: 8 (ref 5–15)
BUN: 53 mg/dL — ABNORMAL HIGH (ref 8–23)
CO2: 29 mmol/L (ref 22–32)
Calcium: 9 mg/dL (ref 8.9–10.3)
Chloride: 102 mmol/L (ref 98–111)
Creatinine, Ser: 2.28 mg/dL — ABNORMAL HIGH (ref 0.61–1.24)
GFR, Estimated: 29 mL/min — ABNORMAL LOW (ref >60)
Glucose, Bld: 105 mg/dL — ABNORMAL HIGH (ref 70–99)
Potassium: 3.9 mmol/L (ref 3.5–5.1)
Sodium: 139 mmol/L (ref 135–145)
Total Bilirubin: 1.5 mg/dL — ABNORMAL HIGH (ref 0.3–1.2)
Total Protein: 5.8 g/dL — ABNORMAL LOW (ref 6.5–8.1)

## 2023-02-13 LAB — MAGNESIUM: Magnesium: 2.2 mg/dL (ref 1.7–2.4)

## 2023-02-13 LAB — PHOSPHORUS: Phosphorus: 3.6 mg/dL (ref 2.5–4.6)

## 2023-02-13 MED ORDER — FUROSEMIDE 10 MG/ML IJ SOLN
80.0000 mg | Freq: Once | INTRAMUSCULAR | Status: AC
  Administered 2023-02-13: 80 mg via INTRAVENOUS
  Filled 2023-02-13: qty 8

## 2023-02-13 MED ORDER — RENA-VITE PO TABS
1.0000 | ORAL_TABLET | Freq: Every day | ORAL | Status: DC
  Administered 2023-02-13 – 2023-02-18 (×6): 1 via ORAL
  Filled 2023-02-13 (×6): qty 1

## 2023-02-13 MED ORDER — PROSOURCE PLUS PO LIQD
30.0000 mL | Freq: Three times a day (TID) | ORAL | Status: DC
  Administered 2023-02-15 – 2023-02-16 (×2): 30 mL via ORAL
  Filled 2023-02-13 (×21): qty 30

## 2023-02-13 NOTE — Discharge Planning (Signed)
Starting outpatient hemodialysis placement at [facility].

## 2023-02-13 NOTE — Progress Notes (Signed)
[name]  ROUNDING NOTE   Subjective:   [name] was admitted to [facility] on 01/29/2023 for Peripheral edema [R60.9] Shock circulatory ([place]) [R57.9] Acute on chronic congestive heart failure, unspecified heart failure type ([place]) [I50.9] Acute renal failure superimposed on chronic kidney disease, unspecified acute renal failure type, unspecified CKD stage ([place]) [N17.9, N18.9]  Patient seen sitting up in bed Alert and oriented No family at bedside Complaining of left lower extremity pain, medication requested  Creatinine 2.28 UOP 925 ml  Objective:  Vital signs in last 24 hours:  Temp:  [97.3 F (36.3 C)-98 F (36.7 C)] 97.4 F (36.3 C) (02/29 1134) Pulse Rate:  [86-96] 88 (02/29 1134) Resp:  [18-20] 18 (02/29 1134) BP: (90-103)/(62-74) 90/74 (02/29 1134) SpO2:  [92 %-98 %] 94 % (02/29 1134) Weight:  [74.9 kg] 74.9 kg (02/29 0819)  Weight change: -0.7 kg Filed Weights   02/11/23 1136 02/12/23 0815 02/13/23 0819  Weight: 79.3 kg 78.6 kg 74.9 kg    Intake/Output: I/O last 3 completed shifts: In: 100 [P.O.:100] Out: 1135 [Urine:1135]   Intake/Output this shift:  Total I/O In: -  Out: 600 [Urine:600]  Physical Exam: General: NAD  Head: Normocephalic, atraumatic   Eyes: Anicteric  Lungs:  Basilar crackles, normal effort, 3 L [place] O2  Heart: regular  Abdomen:  Soft, nontender  Extremities: Trace peripheral edema   Neurologic: Alert and oriented x 3  Skin: Left lower leg wound in clean and dry dressing, bilateral SCDs  Access: Rt permcath    Basic Metabolic Panel: Recent Labs  Lab 02/09/23 0600 02/10/23 0619 02/11/23 0551 02/12/23 0319 02/13/23 0433  NA 143 142 139 136 139  K 3.8 4.0 3.9 3.9 3.9  CL 105 104 100 100 102  CO2 '30 29 27 28 29  '$ GLUCOSE 126* 124* 110* 119* 105*  BUN 97* 94* 60* 44* 53*  CREATININE 3.13* 3.02* 1.99* 1.82* 2.28*  CALCIUM 9.1 9.4 8.9 8.9 9.0  MG 2.5* 2.7* 2.2 2.0 2.2  PHOS 4.3 3.8 3.1 3.3 3.6     Liver  Function Tests: Recent Labs  Lab 02/09/23 0600 02/10/23 0619 02/11/23 0551 02/12/23 0319 02/13/23 0433  AST  --   --   --  26 26  ALT  --   --   --  23 21  ALKPHOS  --   --   --  105 108  BILITOT  --   --   --  1.6* 1.5*  PROT  --   --   --  5.6* 5.8*  ALBUMIN 2.6* 2.5* 2.4* 2.4*  2.4* 2.4*    No results for input(s): "LIPASE", "AMYLASE" in the last 168 hours. No results for input(s): "AMMONIA" in the last 168 hours.   CBC: Recent Labs  Lab 02/07/23 0629 02/11/23 0546 02/12/23 0319 02/13/23 0433  WBC 12.7* 10.5 10.5 9.4  NEUTROABS 10.2*  --  8.7* 7.7  HGB 13.2 12.6* 12.0* 12.2*  HCT 41.4 40.5 39.8 39.5  MCV 102.7* 103.8* 104.2* 103.9*  PLT 83* 149* 158 159     Cardiac Enzymes: No results for input(s): "CKTOTAL", "CKMB", "CKMBINDEX", "TROPONINI" in the last 168 hours.  BNP: Invalid input(s): "POCBNP"  CBG: Recent Labs  Lab 02/07/23 1916 02/08/23 0006 02/08/23 0203 02/08/23 0406 02/08/23 0729  GLUCAP 126* 112* 125* 8* 134*     Microbiology: Results for orders placed or performed during the hospital encounter of 01/29/23  Culture, Respiratory w Gram Stain     Status: None  Collection Time: 01/29/23  3:45 AM   Specimen: Tracheal Aspirate; Respiratory  Result Value Ref Range Status   Specimen Description   Final    TRACHEAL ASPIRATE Performed at [facility], [address] [place], [place] [address]    Special Requests   Final    NONE Performed at [facility], [address] [place], [place] [address]    Gram Stain   Final    MODERATE WBC PRESENT,BOTH PMN AND MONONUCLEAR NO ORGANISMS SEEN    Culture   Final    RARE Normal respiratory flora-no Staph aureus or Pseudomonas seen Performed at [facility] [address] [place], [place] [address]    Report Status 02/01/2023 FINAL  Final  Culture, blood (Routine X 2) w Reflex to ID Panel     Status: None   Collection Time: 01/29/23  1:10 PM   Specimen: BLOOD   Result Value Ref Range Status   Specimen Description BLOOD BLOOD LEFT ARM  Final   Special Requests   Final    BOTTLES DRAWN AEROBIC AND ANAEROBIC Blood Culture adequate volume   Culture   Final    NO GROWTH 5 DAYS Performed at [facility], [address], [place], [place] [address]    Report Status 02/03/2023 FINAL  Final  MRSA Next Gen by PCR, Nasal     Status: None   Collection Time: 01/29/23  1:47 PM   Specimen: Nasal Mucosa; Nasal Swab  Result Value Ref Range Status   MRSA by PCR Next Gen NOT DETECTED NOT DETECTED Final    Comment: (NOTE) The GeneXpert MRSA Assay (FDA approved for NASAL specimens only), is one component of a comprehensive MRSA colonization surveillance program. It is not intended to diagnose MRSA infection nor to guide or monitor treatment for MRSA infections. Test performance is not FDA approved in patients less than 12 years old. Performed at [facility], [address] [place], [place] [address]   Culture, blood (Routine X 2) w Reflex to ID Panel     Status: None   Collection Time: 01/29/23  2:10 PM   Specimen: BLOOD  Result Value Ref Range Status   Specimen Description BLOOD A-LINE  Final   Special Requests   Final    BOTTLES DRAWN AEROBIC AND ANAEROBIC Blood Culture adequate volume   Culture   Final    NO GROWTH 5 DAYS Performed at [facility], [address] [place],  [address]    Report Status 02/03/2023 FINAL  Final    Coagulation Studies: No results for input(s): "LABPROT", "INR" in the last 72 hours.   Urinalysis: No results for input(s): "COLORURINE", "LABSPEC", "PHURINE", "GLUCOSEU", "HGBUR", "BILIRUBINUR", "KETONESUR", "PROTEINUR", "UROBILINOGEN", "NITRITE", "LEUKOCYTESUR" in the last 72 hours.  Invalid input(s): "APPERANCEUR"     Imaging: [place] UE VEIN MAPPING LEFT (PRE-OP AVF)  Result Date: 02/12/2023 CLINICAL DATA:  End-stage renal disease. Please perform left upper extremity venous  mapping prior to attempted dialysis access creation. EXAM: [place] EXTREM UP VEIN MAPPING COMPARISON:  None Available. FINDINGS: LEFT ARTERIES Wrist Radial Artery: Size 2.6 mm Waveform Triphasic Wrist Ulnar Artery: Size 2.0 mm Waveform Triphasic Prox. Forearm Radial Artery: Size 2.4 mm Waveform Triphasic Upper Arm Brachial Artery: Note is made of a high division of the brachial artery at the level of the proximal humerus (representative image 2). LEFT VEINS Forearm Cephalic Vein: Prox 2.7 mm - Depth 4.7 mm; Distal 2.5 mm - Depth 3.6 mm Upper Arm Cephalic  Vein: Prox 2.3 mm - Depth 4.1 mm; Distal 2.4 mm - Depth 3.9 mm Upper Arm Basilic Vein: Prox 8.4 mm - Depth 8.5 mm; Distal 4.4 mm - Depth 9.0 mm Upper Arm Radial vein (secondary to proximal division of the brachial artery): Prox 4.1 mm - Depth 9.5 mm; Distal 4.5 mm - Depth 9.0 mm Upper Arm Ulnar vein (secondary to proximal division of the brachial artery): Prox 5.1 mm - Depth 10.6 mm; Distal 3.8 mm - Depth 14.3 mm ADDITIONAL LEFT VEINS Axillary Vein: 18.2 mm Subclavian Vein: Patient: Yes Respiratory Phasicity: Present Internal Jugular Vein: Patent: Yes    Respiratory Phasicity: Present Branches > 2 mm: None though the cephalic vein appears to have 3 separate divisions at the level of the mid/distal forearm (image 49), while the basilic vein has 3 separate divisions at the level of the distal humerus (image 60). IMPRESSION: Left upper extremity venous mapping as detailed above, significant for high division of the brachial artery at the level of the proximal humerus. Electronically Signed   By: Sandi Mariscal M.D.   On: 02/12/2023 09:32     Medications:    sodium chloride Stopped (02/11/23 0700)     ascorbic acid  500 mg Oral BID   aspirin  81 mg Oral Daily   Chlorhexidine Gluconate Cloth  6 each Topical Q0600   docusate sodium  100 mg Oral BID   feeding supplement  237 mL Oral TID BM   folic acid  1 mg Oral Daily   heparin injection (subcutaneous)  5,000 Units  Subcutaneous Q8H   metoprolol tartrate  12.5 mg Oral BID   multivitamin with minerals  1 tablet Oral Daily   mouth rinse  15 mL Mouth Rinse 4 times per day   thiamine  100 mg Oral Daily   diclofenac Sodium, docusate sodium, mouth rinse, oxyCODONE, polyethylene glycol  Assessment/ Plan:  [name] is a 77 y.o. black male with chronic diastolic congestive heart failure, hypertension, COPD, hyperlipidemia, who is admitted to [facility] on 01/29/2023 for Peripheral edema [R60.9] Shock circulatory ([place]) [R57.9] Acute on chronic congestive heart failure, unspecified heart failure type ([place]) [I50.9] Acute renal failure superimposed on chronic kidney disease, unspecified acute renal failure type, unspecified CKD stage ([place]) [N17.9, N18.9]  Acute Kidney Injury on chronic kidney disease stage IV: baseline creatinine of 2.67, GFR of 24 on 01/06/23. History of bland urine. Acute kidney injury most likely secondary to cardiorenal syndrome. Chronic kidney disease secondary to hypertension. Required CRRT from 2/16 - 2/20. Intermitted hemodialysis on 2/21 with hypotension and thrombosis. Did not tolerate treatment. The next day, patient said he did not want dialysis and his dialysis catheter was removed. However on 2/23, patient says he is open to dialysis if necessary.  Creatinine continues to slowly rise, adequate urine output noted.  Will monitor labs in am to determine if dialysis is needed. Will move forward with outpatient dialysis clinic placement, to not delay discharge planning. We can also release chair if not needed.  Diuretics as needed, will order IV Furosemide '80mg'$  once today Continue to monitor volume status, urine output, renal function and serum electrolytes.    Acute exacerbation of chronic diastolic congestive heart failure and acute respiratory failure requiring intubation and mechanical ventilation.  Extubated on 02/05/23. Home regimen was torsemide '60mg'$  daily and metolazone 2.'5mg'$  twice a  week.   Remains on 3L [place].   Hypertension: with hypotension with cardiogenic shock required vasopressors on admission. 123/82 - currently on metoprolol for  rate control and blood pressure.   Blood pressure remains soft 90/74   LOS: 15 Kateleen Encarnacion 2/29/20241:19 PM

## 2023-02-13 NOTE — Progress Notes (Signed)
PROGRESS NOTE    John [name]  [id number] DOB: 05-Nov-1946 DOA: 01/29/2023 PCP: Alisa Graff, FNP    Brief Narrative:  77 year old male with a history of essential hypertension, on hydralazine and Cozaar, stage IV CKD but has not been on dialysis in the past, has chronic diastolic and systolic heart failure with cor pulmonale, moderate tricuspid regurgitation and aortic valve regurgitation, history of a ascending aortic aneurysm, coronary artery disease with atherosclerosis, COPD and chronic hypoxemia baseline 2 L/min supplemental oxygen at home with recurrent episodes of hypoxemia and remote acute exacerbation of COPD with hypoxemia and hypercapnia 2 months ago history of ruptured left quadricep tendon 2 years ago, chronic thrombocytopenia and erythrocytosis recurrent lower extremity cellulitis, recurrent hyperkalemia, morbid obesity history of anasarca dyslipidemia, recent admission for acute CHF exacerbation requiring Lasix drip and BiPAP support, who was brought in by EMS due to worsening altered mental status and circulatory shock.  01/30/23- I met with girlfriend of patient today to review short term medical plan.  He had Echo whith RV dysfunction and concern for PE.  We have started CRRT yesterday with additional interval improvement. He will have CTPE today.  He is weaned down on pressors today on vasor 0.4 and 4 of levophed reduced from 36 this morning.  01/31/23- remains agitated intermittently overnight. Continued issues with clot burden through CRRT circuit. 02/01/23- remains agitated overnight. Requiring numerous ativan pushes. Attempting to wean down sedation. Failed SBT miserably. 02/02/23- issues with CRRT overnight. Will attempt higher UF today. Assess for possible chest tube placement.  02/03/23- No issues with CRRT overnight, currently running without complication.  CT Head obtained overnight and negative for acute process.  Off Precedex this morning, currently somnolent, mental  status currently precluding extubation. Will perform SBT as mental status permits.  Stop Cefepime, checking ammonia. 02/04/23- Remains on CRRT. Pt more awake and tracking.  Tolerated SBT for 12 hrs  02/05/23- Pt successfully extubated 02/06/23-Off vasopressors.  Palliative Care consulted pt stating he DOES NOT want any form of dialysis.  If pt remains stable will transfer service to [facility] 2/23-care assumed by [facility] hospitalist service. 2/24-after discussion between palliative care, patient, patient's girlfriend the decision was made that the patient would remain DNR status however was interested in restarting hemodialysis.  Nephrology aware.  Kidney function continues to deteriorate. 2/25-repeated discussions with the patient.  He still seems somewhat unsure about dialysis however at this point is agreeing to move forward with PermCath placement.  Vascular surgery made aware.  Patient consented for surgery 2/26. 2/26-PermCath placed today.  Initiating hemodialysis. 2/27-status post hemodialysis today.  Doing reasonably well.  Mental status improved.  Is pretty clear with [place] that he does not want hemodialysis. 2/29: Dialysis held today and yesterday.  Monitoring for renal recovery.  Creatinine started to rise.  Nephrology starting Lasix 80 mg IV.  Assessment & Plan:   Principal Problem:   Shock circulatory ([place]) Active Problems:   Acute on chronic congestive heart failure (HCC)   Acute hypoxic respiratory failure (HCC)   Toxic metabolic encephalopathy  Circulatory shock  Acute decompensated HFpEF and right-sided heart failure n RV dysfunction and dilation on echocardiography, concerning for RV failure and pulmonary hypertension. Porto-pulmonary hypertension on the differential given history of liver cirrhosis. Was on CVVH for ultra-filtration for volume removal, now refusing HD. goal MAP > 65. Plan: Patient agreeable to restarting hemodialysis now.  Cardiology has signed off.  No further diagnostics  from their standpoint.  Acute Hypoxic Respiratory Failure Successfully extubated to nasal  cannula on 2/22.  Doing well overall.  Remains on nasal cannula at 3 to 4 L. Monitor oxygen and continue to wean as tolerated Lasix challenge 100 mg IV x 1 on 2/24 2/29: Respiratory status relatively stable.  On 2 L.  Nephrology ordered Lasix 80 mg IV  Toxic Metabolic Encephalopathy Extubated successfully.  Mental status approaching baseline.  Mental status and in the setting of renal failure, psychotropic medications.  Waxing and waning  Acute kidney injury on chronic kidney disease stage IV Was on CRRT given hypotension.  Now agreeable to restart hemodialysis.  Kidney function continues to deteriorate.  PermCath placed.  First HD 2/26, second HD run 2/27.  Dialysis coordinator to start looking for outpatient HD chair.  Unclear whether this is within the patient's wishes.  Nephrology held HD on 2/28 and 2/29.  Creatinine started to rise.  Nephrology starting intravenous Lasix.  Hepatic cirrhosis Thrombocytopenia No acute issues.  Does confer a poor prognosis.  Was on cefepime empirically given leukocytosis.  Cultures remain negative.  Finished 5 days of antibiotics.  Now discontinued.  DVT prophylaxis: SCD Code Status: DNR Family Communication: Girlfriend at bedside 2/27 Disposition Plan: Status is: Inpatient Remains inpatient appropriate because: Resolving respiratory failure and encephalopathy.  Patient now agreeable to restarting hemodialysis.  Remains DNR.  PermCath placed as 26.  Nephrology following for hemodialysis needs.  Palliative care following for further goals of care discussion  Level of care: Progressive  Consultants:  Cardiology Palliative care   Procedures:  femoral dialysis catheter, now removed PermCath placement  Antimicrobials:   Subjective: Seen and examined.  Fatigued.  Mental status improved  Objective: Vitals:   02/13/23 0431 02/13/23 0819 02/13/23 0854  02/13/23 1134  BP: 99/62  98/70 90/74  Pulse: 89  86 88  Resp: '20  18 18  '$ Temp: 98 F (36.7 C)  (!) 97.3 F (36.3 C) (!) 97.4 F (36.3 C)  TempSrc: Oral     SpO2: 97%  92% 94%  Weight:  74.9 kg    Height:        Intake/Output Summary (Last 24 hours) at 02/13/2023 1509 Last data filed at 02/13/2023 1421 Gross per 24 hour  Intake --  Output 1550 ml  Net -1550 ml   Filed Weights   02/11/23 1136 02/12/23 0815 02/13/23 0819  Weight: 79.3 kg 78.6 kg 74.9 kg    Examination:  General exam: Appears fatigued and frail.  Poor dentition. Respiratory system: Scattered crackles bilaterally.  Normal work of breathing.  2 L Cardiovascular system: Tachycardic, regular rhythm, no murmurs Gastrointestinal system: Soft, NT/ND, normal bowel sounds Central nervous system: Awake and alert, oriented x 2, no focal deficits Extremities: Decreased power bilateral lower extremities Skin: Right mid back wound Psychiatry: Judgement and insight appear normal. Mood & affect appropriate.     Data Reviewed: I have personally reviewed following labs and imaging studies  CBC: Recent Labs  Lab 02/07/23 0629 02/11/23 0546 02/12/23 0319 02/13/23 0433  WBC 12.7* 10.5 10.5 9.4  NEUTROABS 10.2*  --  8.7* 7.7  HGB 13.2 12.6* 12.0* 12.2*  HCT 41.4 40.5 39.8 39.5  MCV 102.7* 103.8* 104.2* 103.9*  PLT 83* 149* 158 [id number]   Basic Metabolic Panel: Recent Labs  Lab 02/09/23 0600 02/10/23 0619 02/11/23 0551 02/12/23 0319 02/13/23 0433  NA 143 142 139 136 139  K 3.8 4.0 3.9 3.9 3.9  CL 105 104 100 100 102  CO2 '30 29 27 28 29  '$ GLUCOSE 126* 124* 110* 119* 105*  BUN 97* 94* 60* 44* 53*  CREATININE 3.13* 3.02* 1.99* 1.82* 2.28*  CALCIUM 9.1 9.4 8.9 8.9 9.0  MG 2.5* 2.7* 2.2 2.0 2.2  PHOS 4.3 3.8 3.1 3.3 3.6   GFR: Estimated Creatinine Clearance: 28.7 mL/min (A) (by C-G formula based on SCr of 2.28 mg/dL (H)). Liver Function Tests: Recent Labs  Lab 02/09/23 0600 02/10/23 0619 02/11/23 0551  02/12/23 0319 02/13/23 0433  AST  --   --   --  26 26  ALT  --   --   --  23 21  ALKPHOS  --   --   --  105 108  BILITOT  --   --   --  1.6* 1.5*  PROT  --   --   --  5.6* 5.8*  ALBUMIN 2.6* 2.5* 2.4* 2.4*  2.4* 2.4*   No results for input(s): "LIPASE", "AMYLASE" in the last 168 hours. No results for input(s): "AMMONIA" in the last 168 hours.  Coagulation Profile: No results for input(s): "INR", "PROTIME" in the last 168 hours. Cardiac Enzymes: No results for input(s): "CKTOTAL", "CKMB", "CKMBINDEX", "TROPONINI" in the last 168 hours. BNP (last 3 results) No results for input(s): "PROBNP" in the last 8760 hours. HbA1C: No results for input(s): "HGBA1C" in the last 72 hours. CBG: Recent Labs  Lab 02/07/23 1916 02/08/23 0006 02/08/23 0203 02/08/23 0406 02/08/23 0729  GLUCAP 126* 112* 125* 124* 134*   Lipid Profile: No results for input(s): "CHOL", "HDL", "LDLCALC", "TRIG", "CHOLHDL", "LDLDIRECT" in the last 72 hours. Thyroid Function Tests: No results for input(s): "TSH", "T4TOTAL", "FREET4", "T3FREE", "THYROIDAB" in the last 72 hours. Anemia Panel: No results for input(s): "VITAMINB12", "FOLATE", "FERRITIN", "TIBC", "IRON", "RETICCTPCT" in the last 72 hours. Sepsis Labs: No results for input(s): "PROCALCITON", "LATICACIDVEN" in the last 168 hours.  No results found for this or any previous visit (from the past 240 hour(s)).        Radiology Studies: [place] UE VEIN MAPPING LEFT (PRE-OP AVF)  Result Date: 02/12/2023 CLINICAL DATA:  End-stage renal disease. Please perform left upper extremity venous mapping prior to attempted dialysis access creation. EXAM: [place] EXTREM UP VEIN MAPPING COMPARISON:  None Available. FINDINGS: LEFT ARTERIES Wrist Radial Artery: Size 2.6 mm Waveform Triphasic Wrist Ulnar Artery: Size 2.0 mm Waveform Triphasic Prox. Forearm Radial Artery: Size 2.4 mm Waveform Triphasic Upper Arm Brachial Artery: Note is made of a high division of the brachial  artery at the level of the proximal humerus (representative image 2). LEFT VEINS Forearm Cephalic Vein: Prox 2.7 mm - Depth 4.7 mm; Distal 2.5 mm - Depth 3.6 mm Upper Arm Cephalic Vein: Prox 2.3 mm - Depth 4.1 mm; Distal 2.4 mm - Depth 3.9 mm Upper Arm Basilic Vein: Prox 8.4 mm - Depth 8.5 mm; Distal 4.4 mm - Depth 9.0 mm Upper Arm Radial vein (secondary to proximal division of the brachial artery): Prox 4.1 mm - Depth 9.5 mm; Distal 4.5 mm - Depth 9.0 mm Upper Arm Ulnar vein (secondary to proximal division of the brachial artery): Prox 5.1 mm - Depth 10.6 mm; Distal 3.8 mm - Depth 14.3 mm ADDITIONAL LEFT VEINS Axillary Vein: 18.2 mm Subclavian Vein: Patient: Yes Respiratory Phasicity: Present Internal Jugular Vein: Patent: Yes    Respiratory Phasicity: Present Branches > 2 mm: None though the cephalic vein appears to have 3 separate divisions at the level of the mid/distal forearm (image 49), while the basilic vein has 3 separate divisions at the level of the distal humerus (image  98). IMPRESSION: Left upper extremity venous mapping as detailed above, significant for high division of the brachial artery at the level of the proximal humerus. Electronically Signed   By: Sandi Mariscal M.D.   On: 02/12/2023 09:32        Scheduled Meds:  ascorbic acid  500 mg Oral BID   aspirin  81 mg Oral Daily   Chlorhexidine Gluconate Cloth  6 each Topical Q0600   docusate sodium  100 mg Oral BID   feeding supplement  237 mL Oral TID BM   folic acid  1 mg Oral Daily   heparin injection (subcutaneous)  5,000 Units Subcutaneous Q8H   metoprolol tartrate  12.5 mg Oral BID   multivitamin with minerals  1 tablet Oral Daily   mouth rinse  15 mL Mouth Rinse 4 times per day   thiamine  100 mg Oral Daily   Continuous Infusions:  sodium chloride Stopped (02/11/23 0700)     LOS: 15 days    Sidney Ace, MD [facility]   If 7PM-7AM, please contact night-coverage  02/13/2023, 3:09 PM

## 2023-02-13 NOTE — TOC Progression Note (Signed)
Transition of Care [facility]) - Progression Note    Patient Details  Name: John [name] MRN: [id number] Date of Birth: 11-28-1946  [facility] of [facility]) CM/SW Contact  Laurena Slimmer, RN Phone Number: 02/13/2023, 4:16 PM  Clinical Narrative:    Spoke with patient and his friend [name] at bedside regarding therapy recommendation for SNF.  Patient and his friend were not clear on difference between dialysis vs SNF. RNCM explained the difference. Patient remember going to [place] previously and would like that as a first choice.    Expected Discharge Plan:  (TBD) Barriers to Discharge: Continued Medical Work up  Expected Discharge Plan and Services   Discharge Planning Services: CM Consult   Living arrangements for the past 2 months: Single Family Home                 DME Arranged: N/A                     Social Determinants of Health (SDOH) Interventions SDOH Screenings   Food Insecurity: No Food Insecurity (01/29/2023)  Housing: Low Risk  (01/29/2023)  Transportation Needs: No Transportation Needs (01/29/2023)  Utilities: Not At Risk (01/29/2023)  Alcohol Screen: Low Risk  (07/17/2022)  Depression (PHQ2-9): Low Risk  (07/30/2022)  Financial Resource Strain: Low Risk  (07/17/2022)  Physical Activity: Sufficiently Active (07/17/2022)  Social Connections: Socially Integrated (07/17/2022)  Stress: No Stress Concern Present (07/17/2022)  Tobacco Use: Medium Risk (02/10/2023)    Readmission Risk Interventions    01/30/2023   11:09 AM  Readmission Risk Prevention Plan  Transportation Screening Complete  Medication Review ([place]) Complete  PCP or Specialist appointment within 3-5 days of discharge Complete  HRI or [place] Complete  SW Recovery Care/Counseling Consult Complete  Palliative Care Screening Complete  [place] Not Applicable

## 2023-02-13 NOTE — Progress Notes (Signed)
02/10/23 1359 02/10/23 1430 02/10/23 1500  Vitals  Temp 98.3 F (36.8 C)  --   --   Temp Source Oral  --   --   BP (!) 105/33 (!) 92/59 103/65  MAP (mmHg) (!) 51 70 77  BP Location Right Leg Right Leg Right Leg  BP Method Automatic Automatic Automatic  Patient Position (if appropriate) Lying Lying Lying  Pulse Rate 85 87 81  Pulse Rate Source Monitor Monitor Monitor  ECG Heart Rate 84 91 87  Resp 20 18 (!) 24  During Treatment Monitoring  Blood Flow Rate (mL/min) 300 mL/min 300 mL/min 300 mL/min  Arterial Pressure (mmHg) -120 mmHg -130 mmHg -130 mmHg  Venous Pressure (mmHg) 150 mmHg 280 mmHg 210 mmHg  TMP (mmHg) 0 mmHg -1 mmHg -3 mmHg  Ultrafiltration Rate (mL/min) 183 mL/min 183 mL/min 183 mL/min  Dialysate Flow Rate (mL/min) 300 ml/min 300 ml/min 300 ml/min  HD Safety Checks Performed Yes Yes Yes  Intra-Hemodialysis Comments Tx initiated Progressing as prescribed Progressing as prescribed  Dialysis Fluid Bolus Normal Saline  --   --   Bolus Amount (mL) 300 mL  --   --     02/10/23 1530 02/10/23 1600 02/10/23 1630  Vitals  Temp  --   --   --   Temp Source  --   --   --   BP 92/73 109/60 106/79  MAP (mmHg) 81 76 87  BP Location Right Leg Right Leg Right Leg  BP Method Automatic Automatic Automatic  Patient Position (if appropriate) Lying Lying Lying  Pulse Rate 91 87 89  Pulse Rate Source Monitor Monitor Monitor  ECG Heart Rate 88 86 89  Resp 18 20 (!) 22  During Treatment Monitoring  Blood Flow Rate (mL/min) 300 mL/min  --  300 mL/min  Arterial Pressure (mmHg) -130 mmHg  --  -130 mmHg  Venous Pressure (mmHg) 260 mmHg  --  170 mmHg  TMP (mmHg) -16 mmHg  --  0 mmHg  Ultrafiltration Rate (mL/min) 183 mL/min  --  493 mL/min  Dialysate Flow Rate (mL/min) 300 ml/min  --  300 ml/min  HD Safety Checks Performed Yes Yes Yes  Intra-Hemodialysis Comments Progressing as prescribed See progress note Progressing as prescribed  Dialysis Fluid Bolus  --   --   --   Bolus Amount  (mL)  --   --   --     02/10/23 1700 02/10/23 1730 02/10/23 1757  Vitals  Temp  --   --  97.7 F (36.5 C)  Temp Source  --   --  Oral  BP 103/68 98/68 (!) 95/42  MAP (mmHg) 74 79 (!) 47  BP Location Right Leg Right Leg Right Leg  BP Method Automatic Automatic Automatic  Patient Position (if appropriate) Lying Lying Lying  Pulse Rate 95 93 93  Pulse Rate Source Monitor Monitor Monitor  ECG Heart Rate 94 95 97  Resp 20 (!) 25 (!) 22  During Treatment Monitoring  Blood Flow Rate (mL/min) 300 mL/min 300 mL/min  --   Arterial Pressure (mmHg) -130 mmHg -130 mmHg  --   Venous Pressure (mmHg) 160 mmHg 160 mmHg  --   TMP (mmHg) 0 mmHg 0 mmHg  --   Ultrafiltration Rate (mL/min) 493 mL/min 534 mL/min  --   Dialysate Flow Rate (mL/min) 300 ml/min 300 ml/min  --   HD Safety Checks Performed Yes Yes Yes  Intra-Hemodialysis Comments Progressing as prescribed;See progress note (Lines reversed due  to high pressure alarms.) Progressing as prescribed Tx completed;Tolerated well  Dialysis Fluid Bolus  --   --   --   Bolus Amount (mL)  --   --   --     02/11/23 0800 02/11/23 0830 02/11/23 0900  Vitals  Temp  --   --   --   Temp Source  --   --   --   BP (!) 84/35 (!) 96/55 120/75  MAP (mmHg) (!) 51 67 88  BP Location Right Leg Right Leg Right Wrist  BP Method Automatic Automatic Automatic  Patient Position (if appropriate) Lying Lying Lying  Pulse Rate 85 95 92  Pulse Rate Source Monitor Monitor Monitor  ECG Heart Rate 99 88 93  Resp '18 19 19  '$ During Treatment Monitoring  Blood Flow Rate (mL/min) 400 mL/min 400 mL/min 400 mL/min  Arterial Pressure (mmHg) -160 mmHg -190 mmHg -190 mmHg  Venous Pressure (mmHg) 220 mmHg 200 mmHg 190 mmHg  TMP (mmHg) -11 mmHg -12 mmHg -11 mmHg  Ultrafiltration Rate (mL/min) 257 mL/min 257 mL/min 257 mL/min  Dialysate Flow Rate (mL/min) 300 ml/min 300 ml/min 300 ml/min  HD Safety Checks Performed Yes Yes Yes  Intra-Hemodialysis Comments Tx initiated (lines  are reversed) Progressing as prescribed Progressing as prescribed  Dialysis Fluid Bolus  --   --   --   Bolus Amount (mL)  --   --   --     02/11/23 0930 02/11/23 1000 02/11/23 1030  Vitals  Temp  --   --   --   Temp Source  --   --   --   BP 119/79 115/78 116/78  MAP (mmHg) 89 88 90  BP Location Right Wrist Right Wrist Right Wrist  BP Method Automatic Automatic Automatic  Patient Position (if appropriate) Lying Lying Lying  Pulse Rate 92 96 94  Pulse Rate Source Monitor Monitor Monitor  ECG Heart Rate 91 92 94  Resp (!) 21 (!) 22 (!) 21  During Treatment Monitoring  Blood Flow Rate (mL/min) 400 mL/min 400 mL/min 400 mL/min  Arterial Pressure (mmHg) -180 mmHg -180 mmHg -180 mmHg  Venous Pressure (mmHg) 190 mmHg 190 mmHg 190 mmHg  TMP (mmHg) -11 mmHg -12 mmHg -11 mmHg  Ultrafiltration Rate (mL/min) 257 mL/min 257 mL/min 257 mL/min  Dialysate Flow Rate (mL/min) 300 ml/min 300 ml/min 300 ml/min  HD Safety Checks Performed Yes Yes Yes  Intra-Hemodialysis Comments Progressing as prescribed Progressing as prescribed Progressing as prescribed  Dialysis Fluid Bolus  --   --   --   Bolus Amount (mL)  --   --   --     02/11/23 1100  Vitals  Temp  --   Temp Source  --   BP 110/81  MAP (mmHg) 90  BP Location Right Wrist  BP Method Automatic  Patient Position (if appropriate) Lying  Pulse Rate 93  Pulse Rate Source Monitor  ECG Heart Rate 100  Resp (!) 23  During Treatment Monitoring  Blood Flow Rate (mL/min) 400 mL/min  Arterial Pressure (mmHg) -180 mmHg  Venous Pressure (mmHg) 240 mmHg  TMP (mmHg) -12 mmHg  Ultrafiltration Rate (mL/min) 257 mL/min  Dialysate Flow Rate (mL/min) 300 ml/min  HD Safety Checks Performed Yes  Intra-Hemodialysis Comments Progressing as prescribed  Dialysis Fluid Bolus  --   Bolus Amount (mL)  --

## 2023-02-13 NOTE — Progress Notes (Signed)
Nutrition Follow-up  DOCUMENTATION CODES:   Not applicable  INTERVENTION:   -30 ml Prosource Plus TID, each supplement provides 100 kcals and 15 grams protein -Magic cup TID with meals, each supplement provides 290 kcal and 9 grams of protein  -Continue 500 mg vitamin C BID -D/c MVI -Renal MVI daily -Feeding assistance with meals -D/c Ensure Enlive po TID, each supplement provides 350 kcal and 20 grams of protein.   NUTRITION DIAGNOSIS:   Inadequate oral intake related to inability to eat (pt sedated and ventilated) as evidenced by NPO status.  Ongoing; advanced to PO diet on 02/07/23  GOAL:   Patient will meet greater than or equal to 90% of their needs  Progressing   MONITOR:   PO intake, Supplement acceptance, Labs, Weight trends, I & O's, Skin  REASON FOR ASSESSMENT:   Ventilator    ASSESSMENT:   77 y/o male with h/o COPD, CHF, HTN, CKD IV, aortic aneurysm and HLD who is admitted with circulatory shock, CHF, encephalopaty and new ESRD. Pt with cirrrhosis noted on CT scan.  2/23- s/p BSE- advanced to dysphagia 1 diet  2/26- HD cath placed, advanced to regular consistency diet  Reviewed I/O's: -925 ml x 24 hours and -20.1 L since 01/30/23  UOP: 925 ml x 24 hours   Per nephology notes, monitoring for renal recovery. HD held today and yesterday.   Spoke with pt at bedside. Pt with flat affect and kept his eyes closed during conversation. Pt consumed only a half of a banana for breakfast. Pt shares that he does not feel hungry and "does not want it". Pt reports he did not eat much yesterday either. Noted meal completions 0-80%. Per pt, he sometimes drinks Ensure, but "does not want one now". Noted pt has also been refusing Ensure supplements. Discussed importance of good meal and supplement intake to promote healing.   Reviewed wt hx; no wt loss noted over the past week. Suspect there may be some fluid changes related to HD.   Medications reviewed and include  vitamin C, folic acid, and thiamine.   Palliative care following; pt revoked comfort care and would like to continue with aggressive measures, including HD.   Labs reviewed: CBGS: 134 (inpatient orders for glycemic control are none).    Diet Order:   Diet Order             Diet regular Room service appropriate? No; Fluid consistency: Thin; Fluid restriction: 1500 mL Fluid  Diet effective now                   EDUCATION NEEDS:   No education needs have been identified at this time  Skin:  Skin Assessment: Reviewed RN Assessment (Partial thickness wounds LE and right upper back; LE related to edema)  Last BM:  02/09/23 (type 5)  Height:   Ht Readings from Last 1 Encounters:  01/29/23 '5\' 11"'$  (1.803 m)    Weight:   Wt Readings from Last 1 Encounters:  02/13/23 74.9 kg    Ideal Body Weight:  78 kg  BMI:  Body mass index is 23.04 kg/m.  Estimated Nutritional Needs:   Kcal:  1800-2100kcal/day  Protein:  90-105g/day  Fluid:  2.0L/day    Loistine Chance, RD, LDN, [place] Registered Dietitian II Certified Diabetes Care and Education Specialist Please refer to [facility] for RD and/or RD on-call/weekend/after hours pager

## 2023-02-14 DIAGNOSIS — R579 Shock, unspecified: Secondary | ICD-10-CM | POA: Diagnosis not present

## 2023-02-14 LAB — RENAL FUNCTION PANEL
Albumin: 2.5 g/dL — ABNORMAL LOW (ref 3.5–5.0)
Anion gap: 6 (ref 5–15)
BUN: 61 mg/dL — ABNORMAL HIGH (ref 8–23)
CO2: 35 mmol/L — ABNORMAL HIGH (ref 22–32)
Calcium: 9.1 mg/dL (ref 8.9–10.3)
Chloride: 100 mmol/L (ref 98–111)
Creatinine, Ser: 2.49 mg/dL — ABNORMAL HIGH (ref 0.61–1.24)
GFR, Estimated: 26 mL/min — ABNORMAL LOW (ref >60)
Glucose, Bld: 107 mg/dL — ABNORMAL HIGH (ref 70–99)
Phosphorus: 3.6 mg/dL (ref 2.5–4.6)
Potassium: 3.6 mmol/L (ref 3.5–5.1)
Sodium: 141 mmol/L (ref 135–145)

## 2023-02-14 LAB — MAGNESIUM: Magnesium: 2.5 mg/dL — ABNORMAL HIGH (ref 1.7–2.4)

## 2023-02-14 NOTE — Discharge Planning (Signed)
PLACEMENT RESOLVED: [facility]  [address] [place], [place] [address] [phone]  Scheduled days: Tuesday Thursday and Saturday Treatment time: 11:00am First Treatment: Tuesday 3/5 @ 10:45am

## 2023-02-14 NOTE — Progress Notes (Signed)
Physical Therapy Treatment Patient Details Name: John [name] MRN: [id number] DOB: Dec 03, 1946 Today's Date: 02/14/2023   History of Present Illness Pt is 77 year old male with a history of essential HTN, stage IV CKD, chronic diastolic and systolic heart failure with cor pulmonale, moderate tricuspid regurgitation and aortic valve regurgitation, ascending aortic aneurysm, CAD, COPD and chronic hypoxemia baseline 2 L/min supplemental oxygen at home, recurrent episodes of hypoxemia, ruptured left quadricep tendon, thrombocytopenia, hyperkalemia, morbid obesity history of anasarca dyslipidemia, and recent admission for acute CHF exacerbation who was brought in by EMS due to worsening altered mental status and circulatory shock.  MD assessment includes: Acute hypoxic respiratory failure in setting of pulmonary edema, pulmonary hypertension, & small bilateral pleural effusions, AKI, thrombocytopenia, leukocytosis, and acute metabolic encephalopathy vs possible hepatic encephalopathy.    PT Comments    Pt declined all mobility despite education and encouragement but did agree to below supine therex only. Pt put forth good effort with therex with no adverse symptoms noted.  Pt will benefit from PT services in a SNF setting upon discharge to safely address deficits listed in patient problem list for decreased caregiver assistance and eventual return to PLOF.     Recommendations for follow up therapy are one component of a multi-disciplinary discharge planning process, led by the attending physician.  Recommendations may be updated based on patient status, additional functional criteria and insurance authorization.  Follow Up Recommendations  Skilled nursing-short term rehab (<3 hours/day) Can patient physically be transported by private vehicle: No   Assistance Recommended at Discharge Frequent or constant Supervision/Assistance  Patient can return home with the following Two people to help with walking  and/or transfers;Two people to help with bathing/dressing/bathroom;Assistance with cooking/housework;Direct supervision/assist for medications management;Help with stairs or ramp for entrance;Assist for transportation;Direct supervision/assist for financial management   Equipment Recommendations  Other (comment) (TBD)    Recommendations for Other Services       Precautions / Restrictions Precautions Precautions: Fall Restrictions Weight Bearing Restrictions: No Other Position/Activity Restrictions: Tunneled hemodialysis catheter via the right internal jugular vein placed 2/26     Mobility  Bed Mobility               General bed mobility comments: Pt declined mobility, agreed to below supine therex only    Transfers                        Ambulation/Gait                   Stairs             Wheelchair Mobility    Modified Rankin (Stroke Patients Only)       Balance                                            Cognition Arousal/Alertness: Awake/alert Behavior During Therapy: Flat affect, WFL for tasks assessed/performed Overall Cognitive Status: Within Functional Limits for tasks assessed                                          Exercises Total Joint Exercises Ankle Circles/Pumps: Strengthening, Both, 10 reps (with manual resistance) Quad Sets: Strengthening, Both, 10 reps Towel Squeeze: Strengthening, Both, 10 reps Heel Slides: AAROM, Strengthening,  Both, 10 reps Hip ABduction/ADduction: AAROM, Strengthening, Both, 10 reps Straight Leg Raises: AAROM, Strengthening, Both, 10 reps Other Exercises Other Exercises: Pt education provided on physiological benefits of activity Other Exercises: Right and left supine leg press x 10 each with manual resistance    General Comments General comments (skin integrity, edema, etc.): VSS t/o sesion. Pt on 3L [place] at start/end.      Pertinent Vitals/Pain Pain  Assessment Pain Assessment: No/denies pain    Home Living                          Prior Function            PT Goals (current goals can now be found in the care plan section) Progress towards PT goals: PT to reassess next treatment    Frequency    Min 2X/week      PT Plan Current plan remains appropriate    Co-evaluation              AM-PAC PT "6 Clicks" Mobility   Outcome Measure  Help needed turning from your back to your side while in a flat bed without using bedrails?: A Lot Help needed moving from lying on your back to sitting on the side of a flat bed without using bedrails?: A Lot Help needed moving to and from a bed to a chair (including a wheelchair)?: Total Help needed standing up from a chair using your arms (e.g., wheelchair or bedside chair)?: A Lot Help needed to walk in hospital room?: Total Help needed climbing 3-5 steps with a railing? : Total 6 Click Score: 9    End of Session Equipment Utilized During Treatment: Oxygen Activity Tolerance: Patient tolerated treatment well Patient left: in bed;with call bell/phone within reach;with bed alarm set;with family/visitor present Nurse Communication: Mobility status PT Visit Diagnosis: Difficulty in walking, not elsewhere classified (R26.2);Muscle weakness (generalized) (M62.81)     Time: [id number] PT Time Calculation (min) (ACUTE ONLY): 20 min  Charges:  $Therapeutic Exercise: 8-22 mins                    D. Royetta Asal PT, DPT 02/14/23, 4:24 PM

## 2023-02-14 NOTE — Progress Notes (Signed)
Progress Note   Patient: John [name] [id number] DOB: [birth year] DOA: 01/29/2023     16 DOS: the patient was seen and examined on 02/14/2023    Brief Narrative:  77 year old male with a history of essential hypertension, on hydralazine and Cozaar, stage IV CKD but has not been on dialysis in the past, has chronic diastolic and systolic heart failure with cor pulmonale, moderate tricuspid regurgitation and aortic valve regurgitation, history of ascending aortic aneurysm, coronary artery disease with atherosclerosis, COPD and chronic hypoxemia baseline 2 L/min supplemental oxygen at home with recurrent episodes of hypoxemia and remote acute exacerbation of COPD with hypoxemia and hypercapnia 2 months ago history of ruptured left quadricep tendon 2 years ago, chronic thrombocytopenia and erythrocytosis recurrent lower extremity cellulitis, recurrent hyperkalemia, morbid obesity history of anasarca dyslipidemia, recent admission for acute CHF exacerbation requiring Lasix drip and BiPAP support, who was brought in by EMS due to worsening altered mental status and circulatory shock.  Patient received CRRT.  He also required intubation on account of worsening respiratory function.  Currently extubated.  He made decision for no dialysis and as such dialysis catheter was removed. Patient had discussion with the girlfriend and later on requested to be on dialysis.  Dialysis catheter has been placed.  Nephrology following  Assessment & Plan:   Principal Problem:   Shock circulatory ([place]) Active Problems:   Acute on chronic congestive heart failure (HCC)   Acute hypoxic respiratory failure (HCC)   Toxic metabolic encephalopathy   Circulatory shock  Acute decompensated HFpEF and right-sided heart failure  RV dysfunction and dilation on echocardiography, concerning for RV failure and pulmonary hypertension. Porto-pulmonary hypertension on the differential given history of liver cirrhosis. Was on CVVH  for ultra-filtration for volume removal, now refusing HD. goal MAP > 65. Plan: Cardiology has signed off.  No further diagnostics from their standpoint. Nephrology is on board we appreciate input   Acute Hypoxic Respiratory Failure Patient extubated successfully Successfully extubated to nasal cannula on 2/22.  Doing well overall.  Currently requiring 3 L of intranasal oxygen  monitor oxygen and continue to wean as tolerated  Toxic Metabolic Encephalopathy Mental status approaching baseline.  Mental status and in the setting of renal failure, psychotropic medications.  Waxing and waning   Acute kidney injury on chronic kidney disease stage IV Was on CRRT given hypotension.   Kidney function continues to deteriorate.   Receiving intermittent hemodialysis according to nephrologist recommendation Dialysis coordinator to start looking for outpatient HD chair.   Unclear whether this is within the patient's wishes.   Nephrology held HD on 2/28 and 2/29.   Nephrology starting intravenous Lasix.   Hepatic cirrhosis Thrombocytopenia No acute issues.  Does confer a poor prognosis.  Was on cefepime empirically given leukocytosis.  Cultures remain negative.  Finished 5 days of antibiotics.  Now discontinued.   DVT prophylaxis: SCD  Code Status: DNR  Family Communication: Plan of care discussed with patient  Disposition Plan: Status is: Inpatient  Remains inpatient appropriate because: Resolving respiratory failure and encephalopathy.  Patient will need outpatient dialysis seat         Subjective: Patient seen and examined at bedside this morning According to him his respiratory function is getting better Denies chest pain nausea vomiting  Physical Exam: Vitals:   02/14/23 0417 02/14/23 0754 02/14/23 1203 02/14/23 1524  BP:  106/78 99/69 110/80  Pulse:  81 86 86  Resp:  '18 20 18  '$ Temp:  97.8 F (36.6 C) 98.6 F (  37 C) 98.4 F (36.9 C)  TempSrc:  Oral    SpO2:  99% 93% 100%   Weight: 77.5 kg     Height:       General exam: Appears fatigued and frail.  Poor dentition. Respiratory system: Scattered crackles bilaterally.  Normal work of breathing.  3L of intranasal oxygen Cardiovascular system: Tachycardic, regular rhythm, no murmurs Gastrointestinal system: Soft, NT/ND, normal bowel sounds Central nervous system: Awake and alert, oriented x 2, no focal deficits Extremities: Decreased power bilateral lower extremities Skin: Right mid back wound Psychiatry: Judgement and insight appear normal. Mood & affect appropriate.   Data Reviewed:  I have reviewed the patient's BMP today  Family Communication: Plan of care discussed with patient  Disposition: Status is: Inpatient   Planned Discharge Destination: Pending clinical course    Time spent: 50  minutes  Author: Verline Lema, MD 02/14/2023 6:40 PM  For on call review [email or web address].

## 2023-02-14 NOTE — Progress Notes (Signed)
Occupational Therapy Treatment Patient Details Name: John [name] MRN: [id number] DOB: Apr 18, 1946 Today's Date: 02/14/2023   History of present illness Pt is 77 year old male with a history of essential HTN, stage IV CKD, chronic diastolic and systolic heart failure with cor pulmonale, moderate tricuspid regurgitation and aortic valve regurgitation, ascending aortic aneurysm, CAD, COPD and chronic hypoxemia baseline 2 L/min supplemental oxygen at home, recurrent episodes of hypoxemia, ruptured left quadricep tendon, thrombocytopenia, hyperkalemia, morbid obesity history of anasarca dyslipidemia, and recent admission for acute CHF exacerbation who was brought in by EMS due to worsening altered mental status and circulatory shock.  MD assessment includes: Acute hypoxic respiratory failure in setting of pulmonary edema, pulmonary hypertension, & small bilateral pleural effusions, AKI, thrombocytopenia, leukocytosis, and acute metabolic encephalopathy vs possible hepatic encephalopathy.   OT comments  John [name] was seen for OT treatment on this date. Pt agreeable to OT tx session with gentle encouragement from this author and supportive partner at bedside. He requires MOD A for bed mobility. Mod A +2 for STS attempts x2 from EOB, SET UP assist for seated UB grooming at EOB. Pt remains generally flat t/o session and becomes tearful after initial mobility attempt. Partner at bedside provides encouragement and pt motivated to complete additional standing attempt. VSS t/o session. Pt on 3L [place], denies SOB but does require increased time to rest between mobility attempts. Pt making good progress toward goals. Pt continues to benefit from skilled OT services to maximize return to PLOF and minimize risk of future falls, injury, caregiver burden, and readmission. Will continue to follow POC. Discharge recommendation remains appropriate.     Recommendations for follow up therapy are one component of a  multi-disciplinary discharge planning process, led by the attending physician.  Recommendations may be updated based on patient status, additional functional criteria and insurance authorization.    Follow Up Recommendations  Skilled nursing-short term rehab (<3 hours/day)     Assistance Recommended at Discharge Frequent or constant Supervision/Assistance  Patient can return home with the following  Two people to help with walking and/or transfers;Two people to help with bathing/dressing/bathroom;Help with stairs or ramp for entrance;Assist for transportation;Assistance with cooking/housework   Equipment Recommendations       Recommendations for Other Services      Precautions / Restrictions Precautions Precautions: Fall Restrictions Weight Bearing Restrictions: No Other Position/Activity Restrictions: Tunneled hemodialysis catheter via the right internal jugular vein placed 2/26       Mobility Bed Mobility Overal bed mobility: Needs Assistance Bed Mobility: Rolling, Sidelying to Sit, Supine to Sit Rolling: Min assist Sidelying to sit: HOB elevated Supine to sit: Mod assist, HOB elevated Sit to supine: Mod assist, Max assist, HOB elevated   General bed mobility comments: MAX A to reposition toward HOB.    Transfers Overall transfer level: Needs assistance Equipment used: Rolling walker (2 wheels) Transfers: Sit to/from Stand Sit to Stand: Mod assist, +2 physical assistance, From elevated surface           General transfer comment: PT performs STSx2 from EOB at elevated height. Cues for hand/foot placement and use of RW.     Balance Overall balance assessment: Needs assistance Sitting-balance support: Feet unsupported, Single extremity supported Sitting balance-Leahy Scale: Fair Sitting balance - Comments: Generally able to maintain static sitting balance with 1 UE support during functional activity.   Standing balance support: Bilateral upper extremity supported,  Reliant on assistive device for balance, During functional activity Standing balance-Leahy Scale: Poor Standing balance comment: Requires MOD  A to maintain upright positioning.                           ADL either performed or assessed with clinical judgement   ADL Overall ADL's : Needs assistance/impaired                                       General ADL Comments: SET UP A to perform seated grooming at EOB. MOD A for bed mobility to come to sitting at EOB. +2  MOD A to STS x2 at EOB. Increased time/effort to perform. Continues to require increased assist for more exertional ADL Management including LB bathing and dressing.    Extremity/Trunk Assessment Upper Extremity Assessment Upper Extremity Assessment: Generalized weakness   Lower Extremity Assessment Lower Extremity Assessment: Generalized weakness        Vision Patient Visual Report: No change from baseline     Perception     Praxis      Cognition Arousal/Alertness: Awake/alert Behavior During Therapy: Flat affect, WFL for tasks assessed/performed Overall Cognitive Status: Within Functional Limits for tasks assessed                                 General Comments: Flat t/o and tearful after mobility attempt. Supportive partner at bedside.        Exercises Other Exercises Other Exercises: Pt educated on importance of functional activity t/o session, transfer techniques, falls prevention strategies, and routines modifications to support safety and functional independence during ADL management. OT facilitated seated oral care and face washing during session. See ADL section for additional details.    Shoulder Instructions       General Comments VSS t/o sesion. Pt on 3L  at start/end.    Pertinent Vitals/ Pain       Pain Assessment Pain Assessment: No/denies pain  Home Living                                          Prior Functioning/Environment               Frequency  Min 2X/week        Progress Toward Goals  OT Goals(current goals can now be found in the care plan section)  Progress towards OT goals: Progressing toward goals  Acute Rehab OT Goals Patient Stated Goal: To feel better. OT Goal Formulation: With patient Time For Goal Achievement: 02/20/23 Potential to Achieve Goals: Good  Plan Discharge plan remains appropriate;Frequency remains appropriate    Co-evaluation                 AM-PAC OT "6 Clicks" Daily Activity     Outcome Measure   Help from another person eating meals?: A Little Help from another person taking care of personal grooming?: A Little Help from another person toileting, which includes using toliet, bedpan, or urinal?: A Lot Help from another person bathing (including washing, rinsing, drying)?: A Lot Help from another person to put on and taking off regular upper body clothing?: A Little Help from another person to put on and taking off regular lower body clothing?: A Lot 6 Click Score: 15    End of Session Equipment Utilized  During Treatment: Gait belt;Rolling walker (2 wheels)  OT Visit Diagnosis: Unsteadiness on feet (R26.81);Repeated falls (R29.6);Muscle weakness (generalized) (M62.81)   Activity Tolerance Patient tolerated treatment well   Patient Left in bed;with call bell/phone within reach;with bed alarm set;with family/visitor present   Nurse Communication Mobility status        Time: [id number] OT Time Calculation (min): 42 min  Charges: OT General Charges $OT Visit: 1 Visit OT Treatments $Self Care/Home Management : 38-52 mins  Shara Blazing, M.S., OTR/L 02/14/23, 4:02 PM

## 2023-02-14 NOTE — Progress Notes (Signed)
[name]  ROUNDING NOTE   Subjective:   [name] was admitted to [facility] on 01/29/2023 for Peripheral edema [R60.9] Shock circulatory ([place]) [R57.9] Acute on chronic congestive heart failure, unspecified heart failure type ([place]) [I50.9] Acute renal failure superimposed on chronic kidney disease, unspecified acute renal failure type, unspecified CKD stage ([place]) [N17.9, N18.9]  Patient sitting up in bed Breakfast tray set up, preparing to eat Alert and oriented, states he feels "ok" today Continues to have pain from left lower extremity   Creatinine 2.49 UOP 1.8L  Objective:  Vital signs in last 24 hours:  Temp:  [97.6 F (36.4 C)-98.6 F (37 C)] 98.6 F (37 C) (03/01 1203) Pulse Rate:  [81-94] 86 (03/01 1203) Resp:  [17-20] 20 (03/01 1203) BP: (90-106)/(67-78) 99/69 (03/01 1203) SpO2:  [90 %-99 %] 93 % (03/01 1203) Weight:  [77.5 kg] 77.5 kg (03/01 0417)  Weight change: -3.666 kg Filed Weights   02/12/23 0815 02/13/23 0819 02/14/23 0417  Weight: 78.6 kg 74.9 kg 77.5 kg    Intake/Output: I/O last 3 completed shifts: In: -  Out: 2450 [Urine:2450]   Intake/Output this shift:  No intake/output data recorded.  Physical Exam: General: NAD  Head: Normocephalic, atraumatic   Eyes: Anicteric  Lungs:  Diminished bases, normal effort, 3 L [place] O2  Heart: regular  Abdomen:  Soft, nontender  Extremities: Trace peripheral edema   Neurologic: Alert and oriented x 3  Skin: Left lower leg wound in clean and dry dressing  Access: Rt permcath    Basic Metabolic Panel: Recent Labs  Lab 02/10/23 0619 02/11/23 0551 02/12/23 0319 02/13/23 0433 02/14/23 0528  NA 142 139 136 139 141  K 4.0 3.9 3.9 3.9 3.6  CL 104 100 100 102 100  CO2 '29 27 28 29 '$ 35*  GLUCOSE 124* 110* 119* 105* 107*  BUN 94* 60* 44* 53* 61*  CREATININE 3.02* 1.99* 1.82* 2.28* 2.49*  CALCIUM 9.4 8.9 8.9 9.0 9.1  MG 2.7* 2.2 2.0 2.2 2.5*  PHOS 3.8 3.1 3.3 3.6 3.6     Liver  Function Tests: Recent Labs  Lab 02/10/23 0619 02/11/23 0551 02/12/23 0319 02/13/23 0433 02/14/23 0528  AST  --   --  26 26  --   ALT  --   --  23 21  --   ALKPHOS  --   --  105 108  --   BILITOT  --   --  1.6* 1.5*  --   PROT  --   --  5.6* 5.8*  --   ALBUMIN 2.5* 2.4* 2.4*  2.4* 2.4* 2.5*    No results for input(s): "LIPASE", "AMYLASE" in the last 168 hours. No results for input(s): "AMMONIA" in the last 168 hours.   CBC: Recent Labs  Lab 02/11/23 0546 02/12/23 0319 02/13/23 0433  WBC 10.5 10.5 9.4  NEUTROABS  --  8.7* 7.7  HGB 12.6* 12.0* 12.2*  HCT 40.5 39.8 39.5  MCV 103.8* 104.2* 103.9*  PLT 149* 158 159     Cardiac Enzymes: No results for input(s): "CKTOTAL", "CKMB", "CKMBINDEX", "TROPONINI" in the last 168 hours.  BNP: Invalid input(s): "POCBNP"  CBG: Recent Labs  Lab 02/07/23 1916 02/08/23 0006 02/08/23 0203 02/08/23 0406 02/08/23 0729  GLUCAP 126* 112* 125* 58* 134*     Microbiology: Results for orders placed or performed during the hospital encounter of 01/29/23  Culture, Respiratory w Gram Stain     Status: None   Collection Time: 01/29/23  3:45  AM   Specimen: Tracheal Aspirate; Respiratory  Result Value Ref Range Status   Specimen Description   Final    TRACHEAL ASPIRATE Performed at [facility], [address] [place], [place] [address]    Special Requests   Final    NONE Performed at [facility], [address] [place], [place] [address]    Gram Stain   Final    MODERATE WBC PRESENT,BOTH PMN AND MONONUCLEAR NO ORGANISMS SEEN    Culture   Final    RARE Normal respiratory flora-no Staph aureus or Pseudomonas seen Performed at [facility] [address] [place], [place] [address]    Report Status 02/01/2023 FINAL  Final  Culture, blood (Routine X 2) w Reflex to ID Panel     Status: None   Collection Time: 01/29/23  1:10 PM   Specimen: BLOOD  Result Value Ref Range Status   Specimen  Description BLOOD BLOOD LEFT ARM  Final   Special Requests   Final    BOTTLES DRAWN AEROBIC AND ANAEROBIC Blood Culture adequate volume   Culture   Final    NO GROWTH 5 DAYS Performed at [facility], [address] [place], [place] [address]    Report Status 02/03/2023 FINAL  Final  MRSA Next Gen by PCR, Nasal     Status: None   Collection Time: 01/29/23  1:47 PM   Specimen: Nasal Mucosa; Nasal Swab  Result Value Ref Range Status   MRSA by PCR Next Gen NOT DETECTED NOT DETECTED Final    Comment: (NOTE) The GeneXpert MRSA Assay (FDA approved for NASAL specimens only), is one component of a comprehensive MRSA colonization surveillance program. It is not intended to diagnose MRSA infection nor to guide or monitor treatment for MRSA infections. Test performance is not FDA approved in patients less than 25 years old. Performed at [facility], [address] [place], [place] [address]   Culture, blood (Routine X 2) w Reflex to ID Panel     Status: None   Collection Time: 01/29/23  2:10 PM   Specimen: BLOOD  Result Value Ref Range Status   Specimen Description BLOOD A-LINE  Final   Special Requests   Final    BOTTLES DRAWN AEROBIC AND ANAEROBIC Blood Culture adequate volume   Culture   Final    NO GROWTH 5 DAYS Performed at [facility], [address] [place], [place] [address]    Report Status 02/03/2023 FINAL  Final    Coagulation Studies: No results for input(s): "LABPROT", "INR" in the last 72 hours.   Urinalysis: No results for input(s): "COLORURINE", "LABSPEC", "PHURINE", "GLUCOSEU", "HGBUR", "BILIRUBINUR", "KETONESUR", "PROTEINUR", "UROBILINOGEN", "NITRITE", "LEUKOCYTESUR" in the last 72 hours.  Invalid input(s): "APPERANCEUR"     Imaging: No results found.   Medications:    sodium chloride Stopped (02/11/23 0700)     (feeding supplement) PROSource Plus  30 mL Oral TID BM   ascorbic acid  500 mg Oral BID   aspirin  81 mg  Oral Daily   Chlorhexidine Gluconate Cloth  6 each Topical Q0600   docusate sodium  100 mg Oral BID   folic acid  1 mg Oral Daily   heparin injection (subcutaneous)  5,000 Units Subcutaneous Q8H   metoprolol tartrate  12.5 mg Oral BID   multivitamin  1 tablet Oral QHS   mouth rinse  15 mL Mouth Rinse 4 times per day   thiamine  100 mg Oral  Daily   diclofenac Sodium, docusate sodium, mouth rinse, oxyCODONE, polyethylene glycol  Assessment/ Plan:  [name] is a 77 y.o. black male with chronic diastolic congestive heart failure, hypertension, COPD, hyperlipidemia, who is admitted to [facility] on 01/29/2023 for Peripheral edema [R60.9] Shock circulatory ([place]) [R57.9] Acute on chronic congestive heart failure, unspecified heart failure type ([place]) [I50.9] Acute renal failure superimposed on chronic kidney disease, unspecified acute renal failure type, unspecified CKD stage ([place]) [N17.9, N18.9]  Acute Kidney Injury on chronic kidney disease stage IV: baseline creatinine of 2.67, GFR of 24 on 01/06/23. History of bland urine. Acute kidney injury most likely secondary to cardiorenal syndrome. Chronic kidney disease secondary to hypertension. Required CRRT from 2/16 - 2/20. Intermitted hemodialysis on 2/21 with hypotension and thrombosis. Did not tolerate treatment. The next day, patient said he did not want dialysis and his dialysis catheter was removed. However on 2/23, patient says he is open to dialysis if necessary.  Creatinine has returned to baseline, dialysis received earlier this week improved his renal function beyond his baseline. Will continue to monitor renal function at this point and monitor stability.  Renal navigator seeking outpatient dialysis clinic.  Diuretics as needed,  IV Furosemide '80mg'$  once given on 02/13/23 Continue to monitor volume status, urine output, renal function and serum electrolytes.    Acute exacerbation of chronic diastolic congestive heart failure and acute  respiratory failure requiring intubation and mechanical ventilation.  Extubated on 02/05/23. Home regimen was torsemide '60mg'$  daily and metolazone 2.'5mg'$  twice a week.   Remains on 3L [place].   Hypertension: with hypotension with cardiogenic shock required vasopressors on admission. 123/82 - currently on metoprolol for rate control and blood pressure.      LOS: [place] 3/1/202412:15 PM

## 2023-02-15 DIAGNOSIS — R579 Shock, unspecified: Secondary | ICD-10-CM | POA: Diagnosis not present

## 2023-02-15 LAB — BASIC METABOLIC PANEL
Anion gap: 8 (ref 5–15)
BUN: 58 mg/dL — ABNORMAL HIGH (ref 8–23)
CO2: 27 mmol/L (ref 22–32)
Calcium: 9.1 mg/dL (ref 8.9–10.3)
Chloride: 102 mmol/L (ref 98–111)
Creatinine, Ser: 2.34 mg/dL — ABNORMAL HIGH (ref 0.61–1.24)
GFR, Estimated: 28 mL/min — ABNORMAL LOW (ref >60)
Glucose, Bld: 106 mg/dL — ABNORMAL HIGH (ref 70–99)
Potassium: 3.6 mmol/L (ref 3.5–5.1)
Sodium: 137 mmol/L (ref 135–145)

## 2023-02-15 LAB — CBC WITH DIFFERENTIAL/PLATELET
Abs Immature Granulocytes: 0.06 10*3/uL (ref 0.00–0.07)
Basophils Absolute: 0 10*3/uL (ref 0.0–0.1)
Basophils Relative: 0 %
Eosinophils Absolute: 0.1 10*3/uL (ref 0.0–0.5)
Eosinophils Relative: 1 %
HCT: 35.8 % — ABNORMAL LOW (ref 39.0–52.0)
Hemoglobin: 11.2 g/dL — ABNORMAL LOW (ref 13.0–17.0)
Immature Granulocytes: 1 %
Lymphocytes Relative: 8 %
Lymphs Abs: 0.7 10*3/uL (ref 0.7–4.0)
MCH: 32.4 pg (ref 26.0–34.0)
MCHC: 31.3 g/dL (ref 30.0–36.0)
MCV: 103.5 fL — ABNORMAL HIGH (ref 80.0–100.0)
Monocytes Absolute: 0.5 10*3/uL (ref 0.1–1.0)
Monocytes Relative: 6 %
Neutro Abs: 7.3 10*3/uL (ref 1.7–7.7)
Neutrophils Relative %: 84 %
Platelets: 175 10*3/uL (ref 150–400)
RBC: 3.46 MIL/uL — ABNORMAL LOW (ref 4.22–5.81)
RDW: 14.4 % (ref 11.5–15.5)
WBC: 8.6 10*3/uL (ref 4.0–10.5)
nRBC: 0 % (ref 0.0–0.2)

## 2023-02-15 LAB — RENAL FUNCTION PANEL
Albumin: 2.4 g/dL — ABNORMAL LOW (ref 3.5–5.0)
Anion gap: 8 (ref 5–15)
BUN: 58 mg/dL — ABNORMAL HIGH (ref 8–23)
CO2: 28 mmol/L (ref 22–32)
Calcium: 9.1 mg/dL (ref 8.9–10.3)
Chloride: 101 mmol/L (ref 98–111)
Creatinine, Ser: 2.25 mg/dL — ABNORMAL HIGH (ref 0.61–1.24)
GFR, Estimated: 29 mL/min — ABNORMAL LOW (ref >60)
Glucose, Bld: 110 mg/dL — ABNORMAL HIGH (ref 70–99)
Phosphorus: 3.3 mg/dL (ref 2.5–4.6)
Potassium: 3.6 mmol/L (ref 3.5–5.1)
Sodium: 137 mmol/L (ref 135–145)

## 2023-02-15 LAB — MAGNESIUM: Magnesium: 2.2 mg/dL (ref 1.7–2.4)

## 2023-02-15 NOTE — NC FL2 (Signed)
[place] LEVEL OF CARE FORM     IDENTIFICATION  Patient Name: John [name] Birthdate: [birth year] Sex: male Admission Date (Current Location): 01/29/2023  [facility] and [place] Number:  Engineering geologist and Address:  [facility], [address], [place], [place] [address]      Provider Number: [id number]  Attending Physician Name and Address:  Verline Lema, MD  Relative Name and Phone Number:  Valda Favia [facility]    Current Level of Care: Hospital Recommended Level of Care: [place] Prior Approval Number:    Date Approved/Denied:   PASRR Number: [id number]  Discharge Plan: SNF    Current Diagnoses: Patient Active Problem List   Diagnosis Date Noted   Toxic metabolic encephalopathy 0000000   Shock circulatory ([place]) 01/29/2023   Impaired fasting glucose 11/24/2022   Hypokalemia 11/24/2022   Acute on chronic respiratory failure with hypoxia and hypercapnia ([place]) 11/21/2022   Obesity (BMI 30-39.9) 11/21/2022   Anasarca 11/21/2022   Acute kidney injury superimposed on chronic kidney disease ([place]) 11/20/2022   Cellulitis 10/07/2022   Acute on chronic diastolic CHF (congestive heart failure) ([place]) 10/02/2022   Myocardial injury 10/02/2022   Hyperkalemia 10/02/2022   Acute hypoxic respiratory failure ([place]) 01/28/2022   Cardiorenal syndrome, stage 1-4 or unspecified chronic kidney disease, with heart failure ([place]) 01/28/2022   Acute left systolic heart failure (HCC) 01/28/2022   Cor pulmonale, acute ([place]) 01/28/2022   Renal failure syndrome 01/28/2022   CKD (chronic kidney disease), stage IV ([place]) 01/28/2022   COPD exacerbation ([place]) 01/28/2022   Cardiogenic shock ([place]) 01/28/2022   Erythrocytosis 12/20/2021   Thrombocytopenia ([place]) 12/20/2021   Acute on chronic congestive heart failure ([place]) 06/21/2020   Hypertension    COPD (chronic obstructive pulmonary disease) ([place]) 02/16/2020    Rupture of left quadriceps tendon 02/15/2020   Community acquired pneumonia 09/01/2019   Pneumonia 09/01/2019    Orientation RESPIRATION BLADDER Height & Weight     Self, Time, Situation, Place  O2 (2 Liters) Incontinent Weight: 169 lb 11.2 oz (77 kg) Height:  '5\' 11"'$  (180.3 cm)  BEHAVIORAL SYMPTOMS/MOOD NEUROLOGICAL BOWEL NUTRITION STATUS      Incontinent Diet (Regular. Please see discharge summary)  AMBULATORY STATUS COMMUNICATION OF NEEDS Skin   Extensive Assist Verbally Normal                       Personal Care Assistance Level of Assistance  Bathing, Dressing, Feeding Bathing Assistance: Maximum assistance Feeding assistance: Independent Dressing Assistance: Maximum assistance     Functional Limitations Info  Sight, Hearing, Speech Sight Info: Adequate Hearing Info: Adequate Speech Info: Adequate    SPECIAL CARE FACTORS FREQUENCY                       Contractures Contractures Info: Not present    Additional Factors Info  Allergies, Code Status Code Status Info: DNR Allergies Info: No known listed           Current Medications (02/15/2023):  This is the current hospital active medication list Current Facility-Administered Medications  Medication Dose Route Frequency Provider Last Rate Last Admin   (feeding supplement) PROSource Plus liquid 30 mL  30 mL Oral TID BM Sreenath, Sudheer B, MD   30 mL at 02/15/23 1337   0.9 %  sodium chloride infusion   Intravenous Continuous Dew, Erskine Squibb, MD   Stopped at 02/11/23 0700   ascorbic acid (VITAMIN  C) tablet 500 mg  500 mg Oral BID Ralene Muskrat B, MD   500 mg at 02/15/23 0908   aspirin chewable tablet 81 mg  81 mg Oral Daily Benita Gutter, RPH   81 mg at 02/15/23 [id number]   Chlorhexidine Gluconate Cloth 2 % PADS 6 each  6 each Topical Q0600 Colon Flattery, NP   6 each at 02/15/23 [id number]   diclofenac Sodium (VOLTAREN) 1 % topical gel 4 g  4 g Topical Q6H PRN Sharion Settler, NP   4 g at 02/13/23 1005    docusate sodium (COLACE) capsule 100 mg  100 mg Oral BID PRN Ottie Glazier, MD       docusate sodium (COLACE) capsule 100 mg  100 mg Oral BID Benita Gutter, RPH   100 mg at [id number]   folic acid (FOLVITE) tablet 1 mg  1 mg Oral Daily Benita Gutter, RPH   1 mg at 02/15/23 [id number]   heparin injection 5,000 Units  5,000 Units Subcutaneous Q8H Dgayli, Berdine Addison, MD   5,000 Units at 02/15/23 1337   metoprolol tartrate (LOPRESSOR) tablet 12.5 mg  12.5 mg Oral BID Tristan Schroeder, PA-C   12.5 mg at 02/15/23 [id number]   multivitamin (RENA-VIT) tablet 1 tablet  1 tablet Oral QHS Ralene Muskrat B, MD   1 tablet at 02/14/23 2216   Oral care mouth rinse  15 mL Mouth Rinse 4 times per day Algernon Huxley, MD   15 mL at 02/14/23 2209   Oral care mouth rinse  15 mL Mouth Rinse PRN Algernon Huxley, MD       oxyCODONE (Oxy IR/ROXICODONE) immediate release tablet 5-10 mg  5-10 mg Oral Q4H PRN Ralene Muskrat B, MD   5 mg at 02/13/23 0957   polyethylene glycol (MIRALAX / GLYCOLAX) packet 17 g  17 g Oral Daily PRN Ottie Glazier, MD       thiamine (VITAMIN B1) tablet 100 mg  100 mg Oral Daily Benita Gutter, RPH   100 mg at 02/15/23 [id number]     Discharge Medications: Please see discharge summary for a list of discharge medications.  Relevant Imaging Results:  Relevant Lab Results:   Additional Information SSN:[id number].  Raina Mina, LCSWA

## 2023-02-15 NOTE — Plan of Care (Signed)
  Problem: Education: Goal: Knowledge of General Education information will improve Description: Including pain rating scale, medication(s)/side effects and non-pharmacologic comfort measures Outcome: Progressing   Problem: Health Behavior/Discharge Planning: Goal: Ability to manage health-related needs will improve Outcome: Progressing   Problem: Clinical Measurements: Goal: Ability to maintain clinical measurements within normal limits will improve Outcome: Progressing Goal: Will remain free from infection Outcome: Progressing Goal: Diagnostic test results will improve Outcome: Progressing Goal: Respiratory complications will improve Outcome: Progressing Goal: Cardiovascular complication will be avoided Outcome: Progressing   Problem: Activity: Goal: Risk for activity intolerance will decrease Outcome: Progressing   Problem: Nutrition: Goal: Adequate nutrition will be maintained Outcome: Progressing   Problem: Coping: Goal: Level of anxiety will decrease Outcome: Progressing   Problem: Elimination: Goal: Will not experience complications related to bowel motility Outcome: Progressing Goal: Will not experience complications related to urinary retention Outcome: Progressing   Problem: Pain Managment: Goal: General experience of comfort will improve Outcome: Progressing   Problem: Safety: Goal: Ability to remain free from injury will improve Outcome: Progressing   Problem: Skin Integrity: Goal: Risk for impaired skin integrity will decrease Outcome: Progressing   Problem: Activity: Goal: Ability to tolerate increased activity will improve Outcome: Progressing   Problem: Respiratory: Goal: Ability to maintain a clear airway and adequate ventilation will improve Outcome: Progressing   Problem: Role Relationship: Goal: Method of communication will improve Outcome: Progressing   Problem: Safety: Goal: Non-violent Restraint(s) Outcome: Progressing   

## 2023-02-15 NOTE — Progress Notes (Signed)
[name]  ROUNDING NOTE   Subjective:   [name] was admitted to [facility] on 01/29/2023 for Peripheral edema [R60.9] Shock circulatory ([place]) [R57.9] Acute on chronic congestive heart failure, unspecified heart failure type ([place]) [I50.9] Acute renal failure superimposed on chronic kidney disease, unspecified acute renal failure type, unspecified CKD stage ([place]) [N17.9, N18.9]  Patient resting quietly, awaiting breakfast Alert and oriented Continues to complain of weakness and fatigue  Weaned to 2 L nasal cannula Lower extremity edema greatly improved since admission Currently denies pain from LLE wound  Creatinine 2.25 UOP 825 mL  Objective:  Vital signs in last 24 hours:  Temp:  [97.8 F (36.6 C)-98.6 F (37 C)] 97.8 F (36.6 C) (03/02 0751) Pulse Rate:  [80-91] 80 (03/02 0751) Resp:  [16-20] 16 (03/02 0751) BP: (99-114)/(68-80) 114/68 (03/02 0751) SpO2:  [89 %-100 %] 89 % (03/02 0751) Weight:  [77.6 kg] 77.6 kg (03/02 0500)  Weight change: 2.666 kg Filed Weights   02/13/23 0819 02/14/23 0417 02/15/23 0500  Weight: 74.9 kg 77.5 kg 77.6 kg    Intake/Output: I/O last 3 completed shifts: In: 480 [P.O.:480] Out: 1425 [Urine:1425]   Intake/Output this shift:  Total I/O In: -  Out: 650 [Urine:650]  Physical Exam: General: NAD  Head: Normocephalic, atraumatic   Eyes: Anicteric  Lungs:  Diminished bases, normal effort, 2 L [place] O2  Heart: regular  Abdomen:  Soft, nontender  Extremities: Trace peripheral edema   Neurologic: Alert and oriented x 3  Skin: Left lower leg wound in clean and dry dressing  Access: Rt permcath    Basic Metabolic Panel: Recent Labs  Lab 02/11/23 0551 02/12/23 0319 02/13/23 0433 02/14/23 0528 02/15/23 0608 02/15/23 0609  NA 139 136 139 141 137 137  K 3.9 3.9 3.9 3.6 3.6 3.6  CL 100 100 102 100 102 101  CO2 '27 28 29 '$ 35* 27 28  GLUCOSE 110* 119* 105* 107* 106* 110*  BUN 60* 44* 53* 61* 58* 58*  CREATININE  1.99* 1.82* 2.28* 2.49* 2.34* 2.25*  CALCIUM 8.9 8.9 9.0 9.1 9.1 9.1  MG 2.2 2.0 2.2 2.5*  --  2.2  PHOS 3.1 3.3 3.6 3.6  --  3.3     Liver Function Tests: Recent Labs  Lab 02/11/23 0551 02/12/23 0319 02/13/23 0433 02/14/23 0528 02/15/23 0609  AST  --  26 26  --   --   ALT  --  23 21  --   --   ALKPHOS  --  105 108  --   --   BILITOT  --  1.6* 1.5*  --   --   PROT  --  5.6* 5.8*  --   --   ALBUMIN 2.4* 2.4*  2.4* 2.4* 2.5* 2.4*    No results for input(s): "LIPASE", "AMYLASE" in the last 168 hours. No results for input(s): "AMMONIA" in the last 168 hours.   CBC: Recent Labs  Lab 02/11/23 0546 02/12/23 0319 02/13/23 0433 02/15/23 0609  WBC 10.5 10.5 9.4 8.6  NEUTROABS  --  8.7* 7.7 7.3  HGB 12.6* 12.0* 12.2* 11.2*  HCT 40.5 39.8 39.5 35.8*  MCV 103.8* 104.2* 103.9* 103.5*  PLT 149* 158 159 175     Cardiac Enzymes: No results for input(s): "CKTOTAL", "CKMB", "CKMBINDEX", "TROPONINI" in the last 168 hours.  BNP: Invalid input(s): "POCBNP"  CBG: No results for input(s): "GLUCAP" in the last 168 hours.   Microbiology: Results for orders placed or performed during the hospital  encounter of 01/29/23  Culture, Respiratory w Gram Stain     Status: None   Collection Time: 01/29/23  3:45 AM   Specimen: Tracheal Aspirate; Respiratory  Result Value Ref Range Status   Specimen Description   Final    TRACHEAL ASPIRATE Performed at [facility], [address], [place], [place] [address]    Special Requests   Final    NONE Performed at [facility], [address] [place], [place] [address]    Gram Stain   Final    MODERATE WBC PRESENT,BOTH PMN AND MONONUCLEAR NO ORGANISMS SEEN    Culture   Final    RARE Normal respiratory flora-no Staph aureus or Pseudomonas seen Performed at [facility] [address], [place], [place] [address]    Report Status 02/01/2023 FINAL  Final  Culture, blood (Routine X 2) w Reflex to ID Panel      Status: None   Collection Time: 01/29/23  1:10 PM   Specimen: BLOOD  Result Value Ref Range Status   Specimen Description BLOOD BLOOD LEFT ARM  Final   Special Requests   Final    BOTTLES DRAWN AEROBIC AND ANAEROBIC Blood Culture adequate volume   Culture   Final    NO GROWTH 5 DAYS Performed at [facility], [address], [place], [place] [address]    Report Status 02/03/2023 FINAL  Final  MRSA Next Gen by PCR, Nasal     Status: None   Collection Time: 01/29/23  1:47 PM   Specimen: Nasal Mucosa; Nasal Swab  Result Value Ref Range Status   MRSA by PCR Next Gen NOT DETECTED NOT DETECTED Final    Comment: (NOTE) The GeneXpert MRSA Assay (FDA approved for NASAL specimens only), is one component of a comprehensive MRSA colonization surveillance program. It is not intended to diagnose MRSA infection nor to guide or monitor treatment for MRSA infections. Test performance is not FDA approved in patients less than 71 years old. Performed at [facility], [address] [place], [place] [address]   Culture, blood (Routine X 2) w Reflex to ID Panel     Status: None   Collection Time: 01/29/23  2:10 PM   Specimen: BLOOD  Result Value Ref Range Status   Specimen Description BLOOD A-LINE  Final   Special Requests   Final    BOTTLES DRAWN AEROBIC AND ANAEROBIC Blood Culture adequate volume   Culture   Final    NO GROWTH 5 DAYS Performed at [facility], [address] [place], [place] [address]    Report Status 02/03/2023 FINAL  Final    Coagulation Studies: No results for input(s): "LABPROT", "INR" in the last 72 hours.   Urinalysis: No results for input(s): "COLORURINE", "LABSPEC", "PHURINE", "GLUCOSEU", "HGBUR", "BILIRUBINUR", "KETONESUR", "PROTEINUR", "UROBILINOGEN", "NITRITE", "LEUKOCYTESUR" in the last 72 hours.  Invalid input(s): "APPERANCEUR"     Imaging: No results found.   Medications:    sodium chloride Stopped (02/11/23 0700)      (feeding supplement) PROSource Plus  30 mL Oral TID BM   ascorbic acid  500 mg Oral BID   aspirin  81 mg Oral Daily   Chlorhexidine Gluconate Cloth  6 each Topical Q0600   docusate sodium  100 mg Oral BID   folic acid  1 mg Oral Daily   heparin injection (subcutaneous)  5,000 Units Subcutaneous Q8H   metoprolol tartrate  12.5 mg Oral BID   multivitamin  1 tablet  Oral QHS   mouth rinse  15 mL Mouth Rinse 4 times per day   thiamine  100 mg Oral Daily   diclofenac Sodium, docusate sodium, mouth rinse, oxyCODONE, polyethylene glycol  Assessment/ Plan:  [name] is a 77 y.o. black male with chronic diastolic congestive heart failure, hypertension, COPD, hyperlipidemia, who is admitted to [facility] on 01/29/2023 for Peripheral edema [R60.9] Shock circulatory ([place]) [R57.9] Acute on chronic congestive heart failure, unspecified heart failure type ([place]) [I50.9] Acute renal failure superimposed on chronic kidney disease, unspecified acute renal failure type, unspecified CKD stage ([place]) [N17.9, N18.9]  Acute Kidney Injury on chronic kidney disease stage IV: baseline creatinine of 2.67, GFR of 24 on 01/06/23. History of bland urine. Acute kidney injury most likely secondary to cardiorenal syndrome. Chronic kidney disease secondary to hypertension. Required CRRT from 2/16 - 2/20. Intermitted hemodialysis on 2/21 with hypotension and thrombosis. Did not tolerate treatment. The next day, patient said he did not want dialysis and his dialysis catheter was removed. However on 2/23, patient says he is open to dialysis if necessary.  Creatinine remains at baseline, acceptable urine output noted. Will order 24-hour urine creatinine clearance to evaluate kidney function.  Will continue to hold dialysis for now. Appreciate renal navigator securing outpatient dialysis clinic.  Will continue to hold chair while evaluating renal function..  Diuretics as needed,  IV Furosemide '80mg'$  once given on  02/13/23    Acute exacerbation of chronic diastolic congestive heart failure and acute respiratory failure requiring intubation and mechanical ventilation.  Extubated on 02/05/23. Home regimen was torsemide '60mg'$  daily and metolazone 2.'5mg'$  twice a week.   Weaned to 2 L.  Diuretics as needed.  Hypertension: with hypotension with cardiogenic shock required vasopressors on admission.  Blood pressure currently 114/68.  Only receiving metoprolol.     LOS: [place] 3/2/202410:06 AM

## 2023-02-15 NOTE — Progress Notes (Signed)
Progress Note   Patient: John [name] [id number] DOB: [birth year] DOA: 01/29/2023     17 DOS: the patient was seen and examined on 02/15/2023   Subjective: Patient seen and examined at bedside this morning Did not want to engage in conversation this morning He gave yes and no answers and continued watching his television Case managers have been informed about the need for skilled nursing facility placement Denies nausea vomiting abdominal pain or chest pain  Brief Narrative:  77 year old male with a history of essential hypertension, on hydralazine and Cozaar, stage IV CKD but has not been on dialysis in the past, has chronic diastolic and systolic heart failure with cor pulmonale, moderate tricuspid regurgitation and aortic valve regurgitation, history of ascending aortic aneurysm, coronary artery disease with atherosclerosis, COPD and chronic hypoxemia baseline 2 L/min supplemental oxygen at home with recurrent episodes of hypoxemia and remote acute exacerbation of COPD with hypoxemia and hypercapnia 2 months ago history of ruptured left quadricep tendon 2 years ago, chronic thrombocytopenia and erythrocytosis recurrent lower extremity cellulitis, recurrent hyperkalemia, morbid obesity history of anasarca dyslipidemia, recent admission for acute CHF exacerbation requiring Lasix drip and BiPAP support, who was brought in by EMS due to worsening altered mental status and circulatory shock.  Patient received CRRT.  He also required intubation on account of worsening respiratory function.  Currently extubated.  He made decision for no dialysis and as such dialysis catheter was removed. Patient had discussion with the girlfriend and later on requested to be on dialysis.  Dialysis catheter has been placed.  Nephrology following   Assessment & Plan:   Principal Problem:   Shock circulatory ([place]) Active Problems:   Acute on chronic congestive heart failure (HCC)   Acute hypoxic respiratory  failure (HCC)   Toxic metabolic encephalopathy   Circulatory shock  Acute decompensated HFpEF and right-sided heart failure  RV dysfunction and dilation on echocardiography, concerning for RV failure and pulmonary hypertension. Porto-pulmonary hypertension on the differential given history of liver cirrhosis. Was on CVVH for ultra-filtration for volume removal, now refusing HD. goal MAP > 65. Plan: Cardiology has signed off.  No further diagnostics from their standpoint. Nephrology is on board we appreciate input   Acute Hypoxic Respiratory Failure Patient extubated successfully Successfully extubated to nasal cannula on 2/22.  Doing well overall.  Currently requiring 3 L of intranasal oxygen  monitor oxygen and continue to wean as tolerated   Toxic Metabolic Encephalopathy Mental status approaching baseline.  Mental status and in the setting of renal failure, psychotropic medications.  Waxing and waning   Acute kidney injury on chronic kidney disease stage IV Was on CRRT given hypotension.   Kidney function continues to deteriorate.   Receiving intermittent hemodialysis according to nephrologist recommendation Dialysis coordinator to start looking for outpatient HD chair.   Unclear whether this is within the patient's wishes.   Nephrology held HD on 2/28 and 2/29.   Nephrology starting intravenous Lasix.   Hepatic cirrhosis Thrombocytopenia No acute issues.  Does confer a poor prognosis.  Was on cefepime empirically given leukocytosis.  Cultures remain negative.  Finished 5 days of antibiotics.  Now discontinued.   DVT prophylaxis: SCD   Code Status: DNR   Family Communication: Plan of care discussed with patient   Disposition Plan: Status is: Inpatient   Remains inpatient appropriate because: Resolving respiratory failure and encephalopathy.  Patient will need outpatient dialysis seat    Physical Exam: General exam: Appears fatigued and frail.  Poor  dentition. Respiratory  system: Scattered crackles bilaterally.  Normal work of breathing.  3L of intranasal oxygen Cardiovascular system: Tachycardic, regular rhythm, no murmurs Gastrointestinal system: Soft, NT/ND, normal bowel sounds Central nervous system: Awake and alert, oriented x 2, no focal deficits Extremities: Decreased power bilateral lower extremities Skin: Right mid back wound Psychiatry: Judgement and insight appear normal. Mood & affect appropriate.    Vitals:   02/15/23 0751 02/15/23 1141 02/15/23 1301 02/15/23 1631  BP: 114/68 121/77 104/75 109/67  Pulse: 80 81 78 92  Resp: '16 16 16 12  '$ Temp: 97.8 F (36.6 C) 97.9 F (36.6 C) 97.8 F (36.6 C) 98.1 F (36.7 C)  TempSrc:   Oral   SpO2: (!) 89% 96% 96% 90%  Weight:  77 kg    Height:        Data Reviewed:   I have reviewed the patient's BMP and cbc Lab Results  Component Value Date   WBC 8.6 02/15/2023   HGB 11.2 (L) 02/15/2023   HCT 35.8 (L) 02/15/2023   MCV 103.5 (H) 02/15/2023   PLT 175 02/15/2023     Family Communication: Plan of care discussed with patient   Disposition: Status is: Inpatient    Planned Discharge Destination: Pending clinical course       Time spent: 35  minutes  Author: Verline Lema, MD 02/15/2023 5:57 PM  For on call review [email or web address].

## 2023-02-16 DIAGNOSIS — R579 Shock, unspecified: Secondary | ICD-10-CM | POA: Diagnosis not present

## 2023-02-16 LAB — CBC WITH DIFFERENTIAL/PLATELET
Abs Immature Granulocytes: 0.07 10*3/uL (ref 0.00–0.07)
Basophils Absolute: 0 10*3/uL (ref 0.0–0.1)
Basophils Relative: 0 %
Eosinophils Absolute: 0 10*3/uL (ref 0.0–0.5)
Eosinophils Relative: 0 %
HCT: 37.1 % — ABNORMAL LOW (ref 39.0–52.0)
Hemoglobin: 11.6 g/dL — ABNORMAL LOW (ref 13.0–17.0)
Immature Granulocytes: 1 %
Lymphocytes Relative: 9 %
Lymphs Abs: 0.8 10*3/uL (ref 0.7–4.0)
MCH: 32.3 pg (ref 26.0–34.0)
MCHC: 31.3 g/dL (ref 30.0–36.0)
MCV: 103.3 fL — ABNORMAL HIGH (ref 80.0–100.0)
Monocytes Absolute: 0.7 10*3/uL (ref 0.1–1.0)
Monocytes Relative: 7 %
Neutro Abs: 7.6 10*3/uL (ref 1.7–7.7)
Neutrophils Relative %: 83 %
Platelets: 186 10*3/uL (ref 150–400)
RBC: 3.59 MIL/uL — ABNORMAL LOW (ref 4.22–5.81)
RDW: 14.3 % (ref 11.5–15.5)
WBC: 9.2 10*3/uL (ref 4.0–10.5)
nRBC: 0 % (ref 0.0–0.2)

## 2023-02-16 LAB — RENAL FUNCTION PANEL
Albumin: 2.5 g/dL — ABNORMAL LOW (ref 3.5–5.0)
Anion gap: 9 (ref 5–15)
BUN: 60 mg/dL — ABNORMAL HIGH (ref 8–23)
CO2: 29 mmol/L (ref 22–32)
Calcium: 9.1 mg/dL (ref 8.9–10.3)
Chloride: 99 mmol/L (ref 98–111)
Creatinine, Ser: 2.45 mg/dL — ABNORMAL HIGH (ref 0.61–1.24)
GFR, Estimated: 26 mL/min — ABNORMAL LOW (ref >60)
Glucose, Bld: 113 mg/dL — ABNORMAL HIGH (ref 70–99)
Phosphorus: 3.2 mg/dL (ref 2.5–4.6)
Potassium: 3.7 mmol/L (ref 3.5–5.1)
Sodium: 137 mmol/L (ref 135–145)

## 2023-02-16 LAB — BASIC METABOLIC PANEL
Anion gap: 8 (ref 5–15)
BUN: 56 mg/dL — ABNORMAL HIGH (ref 8–23)
CO2: 30 mmol/L (ref 22–32)
Calcium: 9 mg/dL (ref 8.9–10.3)
Chloride: 98 mmol/L (ref 98–111)
Creatinine, Ser: 2.49 mg/dL — ABNORMAL HIGH (ref 0.61–1.24)
GFR, Estimated: 26 mL/min — ABNORMAL LOW (ref >60)
Glucose, Bld: 110 mg/dL — ABNORMAL HIGH (ref 70–99)
Potassium: 3.7 mmol/L (ref 3.5–5.1)
Sodium: 136 mmol/L (ref 135–145)

## 2023-02-16 LAB — MAGNESIUM: Magnesium: 2.3 mg/dL (ref 1.7–2.4)

## 2023-02-16 NOTE — TOC Transition Note (Signed)
Transition of Care [facility]) - CM/SW Discharge Note   Patient Details  Name: John [name] MRN: [id number] Date of Birth: Mar 13, 1946  [facility] CM/SW Contact:  Raina Mina, [place] Phone Number: 02/16/2023, 4:00 PM   Clinical Narrative:   SNF bed search started. Patient prefers Radiation protection practitioner       Barriers to Discharge: Continued Medical Work up   Patient Goals and CMS Choice      Discharge Placement                         Discharge Plan and Services Additional resources added to the After Visit Summary for     Discharge Planning Services: CM Consult            DME Arranged: N/A                    Social Determinants of Health (SDOH) Interventions SDOH Screenings   Food Insecurity: No Food Insecurity (01/29/2023)  Housing: Low Risk  (01/29/2023)  Transportation Needs: No Transportation Needs (01/29/2023)  Utilities: Not At Risk (01/29/2023)  Alcohol Screen: Low Risk  (07/17/2022)  Depression (PHQ2-9): Low Risk  (07/30/2022)  Financial Resource Strain: Low Risk  (07/17/2022)  Physical Activity: Sufficiently Active (07/17/2022)  Social Connections: Socially Integrated (07/17/2022)  Stress: No Stress Concern Present (07/17/2022)  Tobacco Use: Medium Risk (02/10/2023)     Readmission Risk Interventions    01/30/2023   11:09 AM  Readmission Risk Prevention Plan  Transportation Screening Complete  Medication Review ([place]) Complete  PCP or Specialist appointment within 3-5 days of discharge Complete  HRI or [place] Complete  SW Recovery Care/Counseling Consult Complete  [place] Not Applicable

## 2023-02-16 NOTE — Progress Notes (Signed)
[name]  ROUNDING NOTE   Subjective:   [name] was admitted to [facility] on 01/29/2023 for Peripheral edema [R60.9] Shock circulatory ([place]) [R57.9] Acute on chronic congestive heart failure, unspecified heart failure type ([place]) [I50.9] Acute renal failure superimposed on chronic kidney disease, unspecified acute renal failure type, unspecified CKD stage ([place]) [N17.9, N18.9]  Patient seen sitting up in bed, breakfast tray at bedside, untouched Patient states he feels really good today Denies nausea vomiting Remains on 2 L nasal cannula No lower extremity edema  Objective:  Vital signs in last 24 hours:  Temp:  [97.4 F (36.3 C)-98.7 F (37.1 C)] 97.4 F (36.3 C) (03/03 0800) Pulse Rate:  [72-92] 83 (03/03 0858) Resp:  [12-16] 16 (03/03 0428) BP: (95-121)/(67-81) 95/81 (03/03 0858) SpO2:  [90 %-100 %] 100 % (03/03 0800) Weight:  [74.7 kg-77 kg] 74.7 kg (03/03 0500)  Weight change: -0.625 kg Filed Weights   02/15/23 0500 02/15/23 1141 02/16/23 0500  Weight: 77.6 kg 77 kg 74.7 kg    Intake/Output: I/O last 3 completed shifts: In: 240 [P.O.:240] Out: 1700 [Urine:1700]   Intake/Output this shift:  No intake/output data recorded.  Physical Exam: General: NAD  Head: Normocephalic, atraumatic   Eyes: Anicteric  Lungs:  Diminished bases, normal effort, 2 L [place] O2  Heart: regular  Abdomen:  Soft, nontender  Extremities: No peripheral edema   Neurologic: Alert and oriented  Skin: Left lower leg wound in clean and dry dressing  Access: Rt permcath    Basic Metabolic Panel: Recent Labs  Lab 02/12/23 0319 02/13/23 0433 02/14/23 0528 02/15/23 0608 02/15/23 0609 02/16/23 0400 02/16/23 0403  NA 136 139 141 137 137 136 137  K 3.9 3.9 3.6 3.6 3.6 3.7 3.7  CL 100 102 100 102 101 98 99  CO2 28 29 35* '27 28 30 29  '$ GLUCOSE 119* 105* 107* 106* 110* 110* 113*  BUN 44* 53* 61* 58* 58* 56* 60*  CREATININE 1.82* 2.28* 2.49* 2.34* 2.25* 2.49* 2.45*   CALCIUM 8.9 9.0 9.1 9.1 9.1 9.0 9.1  MG 2.0 2.2 2.5*  --  2.2  --  2.3  PHOS 3.3 3.6 3.6  --  3.3  --  3.2     Liver Function Tests: Recent Labs  Lab 02/12/23 0319 02/13/23 0433 02/14/23 0528 02/15/23 0609 02/16/23 0403  AST 26 26  --   --   --   ALT 23 21  --   --   --   ALKPHOS 105 108  --   --   --   BILITOT 1.6* 1.5*  --   --   --   PROT 5.6* 5.8*  --   --   --   ALBUMIN 2.4*  2.4* 2.4* 2.5* 2.4* 2.5*    No results for input(s): "LIPASE", "AMYLASE" in the last 168 hours. No results for input(s): "AMMONIA" in the last 168 hours.   CBC: Recent Labs  Lab 02/11/23 0546 02/12/23 0319 02/13/23 0433 02/15/23 0609 02/16/23 0403  WBC 10.5 10.5 9.4 8.6 9.2  NEUTROABS  --  8.7* 7.7 7.3 7.6  HGB 12.6* 12.0* 12.2* 11.2* 11.6*  HCT 40.5 39.8 39.5 35.8* 37.1*  MCV 103.8* 104.2* 103.9* 103.5* 103.3*  PLT 149* 158 159 175 186     Cardiac Enzymes: No results for input(s): "CKTOTAL", "CKMB", "CKMBINDEX", "TROPONINI" in the last 168 hours.  BNP: Invalid input(s): "POCBNP"  CBG: No results for input(s): "GLUCAP" in the last 168 hours.   Microbiology:  Results for orders placed or performed during the hospital encounter of 01/29/23  Culture, Respiratory w Gram Stain     Status: None   Collection Time: 01/29/23  3:45 AM   Specimen: Tracheal Aspirate; Respiratory  Result Value Ref Range Status   Specimen Description   Final    TRACHEAL ASPIRATE Performed at [facility], [address], [place], [place] [address]    Special Requests   Final    NONE Performed at [facility], [address] [place], [place] [address]    Gram Stain   Final    MODERATE WBC PRESENT,BOTH PMN AND MONONUCLEAR NO ORGANISMS SEEN    Culture   Final    RARE Normal respiratory flora-no Staph aureus or Pseudomonas seen Performed at [facility] [address] [place], [place] [address]    Report Status 02/01/2023 FINAL  Final  Culture, blood (Routine X 2) w  Reflex to ID Panel     Status: None   Collection Time: 01/29/23  1:10 PM   Specimen: BLOOD  Result Value Ref Range Status   Specimen Description BLOOD BLOOD LEFT ARM  Final   Special Requests   Final    BOTTLES DRAWN AEROBIC AND ANAEROBIC Blood Culture adequate volume   Culture   Final    NO GROWTH 5 DAYS Performed at [facility], [address] [place], [place] [address]    Report Status 02/03/2023 FINAL  Final  MRSA Next Gen by PCR, Nasal     Status: None   Collection Time: 01/29/23  1:47 PM   Specimen: Nasal Mucosa; Nasal Swab  Result Value Ref Range Status   MRSA by PCR Next Gen NOT DETECTED NOT DETECTED Final    Comment: (NOTE) The GeneXpert MRSA Assay (FDA approved for NASAL specimens only), is one component of a comprehensive MRSA colonization surveillance program. It is not intended to diagnose MRSA infection nor to guide or monitor treatment for MRSA infections. Test performance is not FDA approved in patients less than 44 years old. Performed at [facility], [address] [place], [place] [address]   Culture, blood (Routine X 2) w Reflex to ID Panel     Status: None   Collection Time: 01/29/23  2:10 PM   Specimen: BLOOD  Result Value Ref Range Status   Specimen Description BLOOD A-LINE  Final   Special Requests   Final    BOTTLES DRAWN AEROBIC AND ANAEROBIC Blood Culture adequate volume   Culture   Final    NO GROWTH 5 DAYS Performed at [facility], [address] [place], [place] [address]    Report Status 02/03/2023 FINAL  Final    Coagulation Studies: No results for input(s): "LABPROT", "INR" in the last 72 hours.   Urinalysis: No results for input(s): "COLORURINE", "LABSPEC", "PHURINE", "GLUCOSEU", "HGBUR", "BILIRUBINUR", "KETONESUR", "PROTEINUR", "UROBILINOGEN", "NITRITE", "LEUKOCYTESUR" in the last 72 hours.  Invalid input(s): "APPERANCEUR"     Imaging: No results found.   Medications:    sodium chloride  Stopped (02/11/23 0700)     (feeding supplement) PROSource Plus  30 mL Oral TID BM   ascorbic acid  500 mg Oral BID   aspirin  81 mg Oral Daily   Chlorhexidine Gluconate Cloth  6 each Topical Q0600   docusate sodium  100 mg Oral BID   folic acid  1 mg Oral Daily   heparin injection (subcutaneous)  5,000 Units Subcutaneous Q8H   metoprolol tartrate  12.5  mg Oral BID   multivitamin  1 tablet Oral QHS   mouth rinse  15 mL Mouth Rinse 4 times per day   thiamine  100 mg Oral Daily   diclofenac Sodium, docusate sodium, mouth rinse, oxyCODONE, polyethylene glycol  Assessment/ Plan:  [name] is a 77 y.o. black male with chronic diastolic congestive heart failure, hypertension, COPD, hyperlipidemia, who is admitted to [facility] on 01/29/2023 for Peripheral edema [R60.9] Shock circulatory ([place]) [R57.9] Acute on chronic congestive heart failure, unspecified heart failure type ([place]) [I50.9] Acute renal failure superimposed on chronic kidney disease, unspecified acute renal failure type, unspecified CKD stage ([place]) [N17.9, N18.9]  Acute Kidney Injury on chronic kidney disease stage IV: baseline creatinine of 2.67, GFR of 24 on 01/06/23. History of bland urine. Acute kidney injury most likely secondary to cardiorenal syndrome. Chronic kidney disease secondary to hypertension. Required CRRT from 2/16 - 2/20. Intermitted hemodialysis on 2/21 with hypotension and thrombosis. Did not tolerate treatment. The next day, patient said he did not want dialysis and his dialysis catheter was removed. However on 2/23, patient says he is open to dialysis if necessary.  Creatinine has returned to baseline with acceptable urine output. 24-hour urine creatinine clearance in progress. Appreciate renal navigator securing outpatient dialysis clinic.  Will continue to hold chair while evaluating renal function..  Diuretics as needed, last given IV Furosemide '80mg'$  once on 02/13/23    Acute exacerbation of chronic  diastolic congestive heart failure and acute respiratory failure requiring intubation and mechanical ventilation.  Extubated on 02/05/23. Home regimen was torsemide '60mg'$  daily and metolazone 2.'5mg'$  twice a week.   Remains on 2 L.  Diuretics as needed.  Hypertension: with hypotension with cardiogenic shock required vasopressors on admission.  Blood pressure soft but stable this morning, 95/81. Only receiving metoprolol.     LOS: [place] 3/3/202411:10 AM

## 2023-02-16 NOTE — Progress Notes (Signed)
Progress Note   Patient: John [name] [id number] DOB: Sep 27, 1946 DOA: 01/29/2023     18 DOS: the patient was seen and examined on 02/16/2023    Subjective: Patient seen and examined at bedside this morning He tells me he is feeling better today Case managers have been informed about the need for skilled nursing facility placement Denies nausea vomiting abdominal pain or chest pain   Brief Narrative:  77 year old male with a history of essential hypertension, on hydralazine and Cozaar, stage IV CKD but has not been on dialysis in the past, has chronic diastolic and systolic heart failure with cor pulmonale, moderate tricuspid regurgitation and aortic valve regurgitation, history of ascending aortic aneurysm, coronary artery disease with atherosclerosis, COPD and chronic hypoxemia baseline 2 L/min supplemental oxygen at home with recurrent episodes of hypoxemia and remote acute exacerbation of COPD with hypoxemia and hypercapnia 2 months ago history of ruptured left quadricep tendon 2 years ago, chronic thrombocytopenia and erythrocytosis recurrent lower extremity cellulitis, recurrent hyperkalemia, morbid obesity history of anasarca dyslipidemia, recent admission for acute CHF exacerbation requiring Lasix drip and BiPAP support, who was brought in by EMS due to worsening altered mental status and circulatory shock.  Patient received CRRT.  He also required intubation on account of worsening respiratory function.  Currently extubated.  He made decision for no dialysis and as such dialysis catheter was removed. Patient had discussion with the girlfriend and later on requested to be on dialysis.  Dialysis catheter has been placed.  Nephrology following   Assessment & Plan:   Principal Problem:   Shock circulatory ([place]) Active Problems:   Acute on chronic congestive heart failure (HCC)   Acute hypoxic respiratory failure (HCC)   Toxic metabolic encephalopathy   Circulatory shock  Acute  decompensated HFpEF and right-sided heart failure  RV dysfunction and dilation on echocardiography, concerning for RV failure and pulmonary hypertension. Porto-pulmonary hypertension on the differential given history of liver cirrhosis. Was on CVVH for ultra-filtration for volume removal, now refusing HD. goal MAP > 65. Plan: Cardiology has signed off.  No further diagnostics from their standpoint. Nephrology is on board we appreciate input   Acute Hypoxic Respiratory Failure Patient extubated successfully Successfully extubated to nasal cannula on 2/22.  Doing well overall.  Currently requiring 3 L of intranasal oxygen  monitor oxygen and continue to wean as tolerated   Toxic Metabolic Encephalopathy Mental status approaching baseline.  Mental status and in the setting of renal failure, psychotropic medications.  Waxing and waning   Acute kidney injury on chronic kidney disease stage IV Was on CRRT given hypotension.   Kidney function continues to deteriorate.   Receiving intermittent hemodialysis according to nephrologist recommendation Patient awaiting skilled nursing facility bed with dialysis capabilities Nephrology held HD on 2/28 and 2/29.   Patient receiving intermittent Lasix as recommended by nephrologist.  Last dose 02/13/2023   Hepatic cirrhosis Thrombocytopenia No acute issues.  Does confer a poor prognosis.  Was on cefepime empirically given leukocytosis.  Cultures remain negative.  Finished 5 days of antibiotics.  Now discontinued.   DVT prophylaxis: SCD   Code Status: DNR   Family Communication: Plan of care discussed with patient   Disposition Plan: Status is: Inpatient   Remains inpatient appropriate because: Resolving respiratory failure and encephalopathy.  Patient pending discharge to skilled nursing facility       Physical Exam: General exam: Appears fatigued and frail.  Poor dentition. Respiratory system: Scattered crackles bilaterally.  Normal work of  breathing.  3L of intranasal oxygen Cardiovascular system: Tachycardic, regular rhythm, no murmurs Gastrointestinal system: Soft, NT/ND, normal bowel sounds Central nervous system: Awake and alert, oriented x 2, no focal deficits Extremities: Decreased power bilateral lower extremities Skin: Right mid back wound Psychiatry: Judgement and insight appear normal. Mood & affect appropriate.             Vitals:   02/16/23 0800 02/16/23 0858 02/16/23 1234 02/16/23 1541  BP: 95/81 95/81 113/81 120/81  Pulse: 83 83 81 80  Resp:   16 18  Temp: (!) 97.4 F (36.3 C)  97.9 F (36.6 C)   TempSrc: Axillary     SpO2: 100%  99% 100%  Weight:      Height:        Total time spent 35 minutes  Author: Verline Lema, MD 02/16/2023 3:48 PM  For on call review [email or web address].

## 2023-02-17 ENCOUNTER — Encounter
Admission: EM | Disposition: A | Discharge status: Skilled Nursing Facility | Payer: Self-pay | Source: Home / Self Care | Attending: Internal Medicine

## 2023-02-17 DIAGNOSIS — Z452 Encounter for adjustment and management of vascular access device: Secondary | ICD-10-CM

## 2023-02-17 DIAGNOSIS — R579 Shock, unspecified: Secondary | ICD-10-CM | POA: Diagnosis not present

## 2023-02-17 DIAGNOSIS — N186 End stage renal disease: Secondary | ICD-10-CM | POA: Diagnosis not present

## 2023-02-17 HISTORY — PX: DIALYSIS/PERMA CATHETER REMOVAL: CATH118289

## 2023-02-17 LAB — RENAL FUNCTION PANEL
Albumin: 2.5 g/dL — ABNORMAL LOW (ref 3.5–5.0)
Anion gap: 7 (ref 5–15)
BUN: 51 mg/dL — ABNORMAL HIGH (ref 8–23)
CO2: 31 mmol/L (ref 22–32)
Calcium: 9.1 mg/dL (ref 8.9–10.3)
Chloride: 101 mmol/L (ref 98–111)
Creatinine, Ser: 2.3 mg/dL — ABNORMAL HIGH (ref 0.61–1.24)
GFR, Estimated: 29 mL/min — ABNORMAL LOW (ref >60)
Glucose, Bld: 105 mg/dL — ABNORMAL HIGH (ref 70–99)
Phosphorus: 2.9 mg/dL (ref 2.5–4.6)
Potassium: 3.7 mmol/L (ref 3.5–5.1)
Sodium: 139 mmol/L (ref 135–145)

## 2023-02-17 LAB — CBC WITH DIFFERENTIAL/PLATELET
Abs Immature Granulocytes: 0.04 10*3/uL (ref 0.00–0.07)
Basophils Absolute: 0 10*3/uL (ref 0.0–0.1)
Basophils Relative: 0 %
Eosinophils Absolute: 0 10*3/uL (ref 0.0–0.5)
Eosinophils Relative: 1 %
HCT: 38.1 % — ABNORMAL LOW (ref 39.0–52.0)
Hemoglobin: 11.8 g/dL — ABNORMAL LOW (ref 13.0–17.0)
Immature Granulocytes: 1 %
Lymphocytes Relative: 10 %
Lymphs Abs: 0.8 10*3/uL (ref 0.7–4.0)
MCH: 32 pg (ref 26.0–34.0)
MCHC: 31 g/dL (ref 30.0–36.0)
MCV: 103.3 fL — ABNORMAL HIGH (ref 80.0–100.0)
Monocytes Absolute: 0.6 10*3/uL (ref 0.1–1.0)
Monocytes Relative: 7 %
Neutro Abs: 6.2 10*3/uL (ref 1.7–7.7)
Neutrophils Relative %: 81 %
Platelets: 197 10*3/uL (ref 150–400)
RBC: 3.69 MIL/uL — ABNORMAL LOW (ref 4.22–5.81)
RDW: 14.2 % (ref 11.5–15.5)
WBC: 7.7 10*3/uL (ref 4.0–10.5)
nRBC: 0 % (ref 0.0–0.2)

## 2023-02-17 LAB — CREATININE CLEARANCE, URINE, 24 HOUR
Collection Interval-CRCL: 24 hours
Creatinine Clearance: 26 mL/min — ABNORMAL LOW (ref 75–125)
Creatinine, 24H Ur: 837 mg/d (ref 800–2000)
Creatinine, Urine: 93 mg/dL

## 2023-02-17 LAB — MAGNESIUM: Magnesium: 2.2 mg/dL (ref 1.7–2.4)

## 2023-02-17 SURGERY — DIALYSIS/PERMA CATHETER REMOVAL
Anesthesia: LOCAL | Laterality: Right

## 2023-02-17 MED ORDER — LIDOCAINE-EPINEPHRINE (PF) 1 %-1:200000 IJ SOLN
INTRAMUSCULAR | Status: DC | PRN
  Administered 2023-02-17: 20 mL

## 2023-02-17 SURGICAL SUPPLY — 1 items: TRAY LACERAT/PLASTIC (MISCELLANEOUS) IMPLANT

## 2023-02-17 NOTE — TOC Progression Note (Signed)
Transition of Care [facility]) - Progression Note    Patient Details  Name: Ezequel Capua MRN: [id number] Date of Birth: [birth year]  Transition [facility]) CM/SW Contact  Laurena Slimmer, RN Phone Number: 02/17/2023, 9:37 AM  Clinical Narrative:    No bed offers. All offers are pending in hub.    Expected Discharge Plan:  (TBD) Barriers to Discharge: Continued Medical Work up  Expected Discharge Plan and Services   Discharge Planning Services: CM Consult   Living arrangements for the past 2 months: Single Family Home                 DME Arranged: N/A                     Social Determinants of Health (SDOH) Interventions SDOH Screenings   Food Insecurity: No Food Insecurity (01/29/2023)  Housing: Low Risk  (01/29/2023)  Transportation Needs: No Transportation Needs (01/29/2023)  Utilities: Not At Risk (01/29/2023)  Alcohol Screen: Low Risk  (07/17/2022)  Depression (PHQ2-9): Low Risk  (07/30/2022)  Financial Resource Strain: Low Risk  (07/17/2022)  Physical Activity: Sufficiently Active (07/17/2022)  Social Connections: Socially Integrated (07/17/2022)  Stress: No Stress Concern Present (07/17/2022)  Tobacco Use: Medium Risk (02/10/2023)    Readmission Risk Interventions    01/30/2023   11:09 AM  Readmission Risk Prevention Plan  Transportation Screening Complete  Medication Review ([place]) Complete  PCP or Specialist appointment within 3-5 days of discharge Complete  HRI or [place] Complete  SW Recovery Care/Counseling Consult Complete  Palliative Care Screening Complete  [place] Not Applicable

## 2023-02-17 NOTE — Progress Notes (Signed)
[name]  ROUNDING NOTE   Subjective:   [name] was admitted to [facility] on 01/29/2023 for Peripheral edema [R60.9] Shock circulatory ([place]) [R57.9] Acute on chronic congestive heart failure, unspecified heart failure type ([place]) [I50.9] Acute renal failure superimposed on chronic kidney disease, unspecified acute renal failure type, unspecified CKD stage ([place]) [N17.9, N18.9]  Patient seen sitting up in the chair States he feels well today Tolerating meals without nausea or vomiting No lower extremity edema   Creatinine 2.30 Urine output 1.1L       Objective:  Vital signs in last 24 hours:  Temp:  [97.4 F (36.3 C)-98.5 F (36.9 C)] 97.9 F (36.6 C) (03/04 1237) Pulse Rate:  [78-87] 82 (03/04 1237) Resp:  [16-20] 18 (03/04 0517) BP: (99-120)/(67-87) 99/71 (03/04 1237) SpO2:  [92 %-100 %] 94 % (03/04 1237)  Weight change:  Filed Weights   02/15/23 0500 02/15/23 1141 02/16/23 0500  Weight: 77.6 kg 77 kg 74.7 kg    Intake/Output: I/O last 3 completed shifts: In: 237 [P.O.:237] Out: 1600 [Urine:1600]   Intake/Output this shift:  No intake/output data recorded.  Physical Exam: General: NAD  Head: Normocephalic, atraumatic   Eyes: Anicteric  Lungs:  Diminished bases, normal effort, 2 L [place] O2  Heart: regular  Abdomen:  Soft, nontender  Extremities: No peripheral edema   Neurologic: Alert and oriented  Skin: Left lower leg wound in clean and dry dressing  Access: Rt permcath    Basic Metabolic Panel: Recent Labs  Lab 02/13/23 0433 02/14/23 0528 02/15/23 0608 02/15/23 0609 02/16/23 0400 02/16/23 0403 02/17/23 0542 02/17/23 0543  NA 139 141 137 137 136 137  --  139  K 3.9 3.6 3.6 3.6 3.7 3.7  --  3.7  CL 102 100 102 101 98 99  --  101  CO2 29 35* '27 28 30 29  '$ --  31  GLUCOSE 105* 107* 106* 110* 110* 113*  --  105*  BUN 53* 61* 58* 58* 56* 60*  --  51*  CREATININE 2.28* 2.49* 2.34* 2.25* 2.49* 2.45*  --  2.30*  CALCIUM 9.0 9.1 9.1  9.1 9.0 9.1  --  9.1  MG 2.2 2.5*  --  2.2  --  2.3 2.2  --   PHOS 3.6 3.6  --  3.3  --  3.2  --  2.9     Liver Function Tests: Recent Labs  Lab 02/12/23 0319 02/13/23 0433 02/14/23 0528 02/15/23 0609 02/16/23 0403 02/17/23 0543  AST 26 26  --   --   --   --   ALT 23 21  --   --   --   --   ALKPHOS 105 108  --   --   --   --   BILITOT 1.6* 1.5*  --   --   --   --   PROT 5.6* 5.8*  --   --   --   --   ALBUMIN 2.4*  2.4* 2.4* 2.5* 2.4* 2.5* 2.5*    No results for input(s): "LIPASE", "AMYLASE" in the last 168 hours. No results for input(s): "AMMONIA" in the last 168 hours.   CBC: Recent Labs  Lab 02/12/23 0319 02/13/23 0433 02/15/23 0609 02/16/23 0403 02/17/23 0542  WBC 10.5 9.4 8.6 9.2 7.7  NEUTROABS 8.7* 7.7 7.3 7.6 6.2  HGB 12.0* 12.2* 11.2* 11.6* 11.8*  HCT 39.8 39.5 35.8* 37.1* 38.1*  MCV 104.2* 103.9* 103.5* 103.3* 103.3*  PLT 158 159 175  186 197     Cardiac Enzymes: No results for input(s): "CKTOTAL", "CKMB", "CKMBINDEX", "TROPONINI" in the last 168 hours.  BNP: Invalid input(s): "POCBNP"  CBG: No results for input(s): "GLUCAP" in the last 168 hours.   Microbiology: Results for orders placed or performed during the hospital encounter of 01/29/23  Culture, Respiratory w Gram Stain     Status: None   Collection Time: 01/29/23  3:45 AM   Specimen: Tracheal Aspirate; Respiratory  Result Value Ref Range Status   Specimen Description   Final    TRACHEAL ASPIRATE Performed at [facility], [address], [place], [place] [address]    Special Requests   Final    NONE Performed at [facility], [address] [place], [place] [address]    Gram Stain   Final    MODERATE WBC PRESENT,BOTH PMN AND MONONUCLEAR NO ORGANISMS SEEN    Culture   Final    RARE Normal respiratory flora-no Staph aureus or Pseudomonas seen Performed at [facility] [address], [place], [place] [address]    Report Status 02/01/2023 FINAL  Final   Culture, blood (Routine X 2) w Reflex to ID Panel     Status: None   Collection Time: 01/29/23  1:10 PM   Specimen: BLOOD  Result Value Ref Range Status   Specimen Description BLOOD BLOOD LEFT ARM  Final   Special Requests   Final    BOTTLES DRAWN AEROBIC AND ANAEROBIC Blood Culture adequate volume   Culture   Final    NO GROWTH 5 DAYS Performed at [facility], [address] [place], [place] [address]    Report Status 02/03/2023 FINAL  Final  MRSA Next Gen by PCR, Nasal     Status: None   Collection Time: 01/29/23  1:47 PM   Specimen: Nasal Mucosa; Nasal Swab  Result Value Ref Range Status   MRSA by PCR Next Gen NOT DETECTED NOT DETECTED Final    Comment: (NOTE) The GeneXpert MRSA Assay (FDA approved for NASAL specimens only), is one component of a comprehensive MRSA colonization surveillance program. It is not intended to diagnose MRSA infection nor to guide or monitor treatment for MRSA infections. Test performance is not FDA approved in patients less than 53 years old. Performed at [facility], [address] [place], [place] [address]   Culture, blood (Routine X 2) w Reflex to ID Panel     Status: None   Collection Time: 01/29/23  2:10 PM   Specimen: BLOOD  Result Value Ref Range Status   Specimen Description BLOOD A-LINE  Final   Special Requests   Final    BOTTLES DRAWN AEROBIC AND ANAEROBIC Blood Culture adequate volume   Culture   Final    NO GROWTH 5 DAYS Performed at [facility], [address] [place] [address], [place] [address]    Report Status 02/03/2023 FINAL  Final    Coagulation Studies: No results for input(s): "LABPROT", "INR" in the last 72 hours.   Urinalysis: No results for input(s): "COLORURINE", "LABSPEC", "PHURINE", "GLUCOSEU", "HGBUR", "BILIRUBINUR", "KETONESUR", "PROTEINUR", "UROBILINOGEN", "NITRITE", "LEUKOCYTESUR" in the last 72 hours.  Invalid input(s): "APPERANCEUR"     Imaging: No results  found.   Medications:    sodium chloride Stopped (02/11/23 0700)     (feeding supplement) PROSource Plus  30 mL Oral TID BM   ascorbic acid  500 mg Oral BID   aspirin  81 mg Oral Daily   Chlorhexidine  Gluconate Cloth  6 each Topical Q0600   docusate sodium  100 mg Oral BID   folic acid  1 mg Oral Daily   heparin injection (subcutaneous)  5,000 Units Subcutaneous Q8H   metoprolol tartrate  12.5 mg Oral BID   multivitamin  1 tablet Oral QHS   mouth rinse  15 mL Mouth Rinse 4 times per day   thiamine  100 mg Oral Daily   diclofenac Sodium, docusate sodium, mouth rinse, oxyCODONE, polyethylene glycol  Assessment/ Plan:  Mr. Oaklyn Busko is a 77 y.o. black male with chronic diastolic congestive heart failure, hypertension, COPD, hyperlipidemia, who is admitted to [facility] on 01/29/2023 for Peripheral edema [R60.9] Shock circulatory ([place]) [R57.9] Acute on chronic congestive heart failure, unspecified heart failure type ([place]) [I50.9] Acute renal failure superimposed on chronic kidney disease, unspecified acute renal failure type, unspecified CKD stage ([place]) [N17.9, N18.9]  Acute Kidney Injury on chronic kidney disease stage IV: baseline creatinine of 2.67, GFR of 24 on 01/06/23. History of bland urine. Acute kidney injury most likely secondary to cardiorenal syndrome. Chronic kidney disease secondary to hypertension. Required CRRT from 2/16 - 2/20. Intermitted hemodialysis on 2/21 with hypotension and thrombosis. Did not tolerate treatment. The next day, patient said he did not want dialysis and his dialysis catheter was removed. However on 2/23, patient says he is open to dialysis if necessary.  Creatinine remains at baseline with good urine output Urine creatinine clearance 26 Vascular consulted to remove permcath. Will release outpatient dialysis chair.  Diuretics as needed   Acute exacerbation of chronic diastolic congestive heart failure and acute respiratory failure requiring intubation  and mechanical ventilation.  Extubated on 02/05/23. Home regimen was torsemide '60mg'$  daily and metolazone 2.'5mg'$  twice a week.   Remains on 2 L.  Diuretics as needed.  Hypertension: with hypotension with cardiogenic shock required vasopressors on admission.  Blood pressure soft, 99/71     LOS: 19 [place] 3/4/20242:47 PM

## 2023-02-17 NOTE — Progress Notes (Signed)
Physical Therapy Treatment Patient Details Name: John [name] MRN: [id number] DOB: 10/27/1946 Today's Date: 02/17/2023   History of Present Illness Pt is 77 year old male with a history of essential HTN, stage IV CKD, chronic diastolic and systolic heart failure with cor pulmonale, moderate tricuspid regurgitation and aortic valve regurgitation, ascending aortic aneurysm, CAD, COPD and chronic hypoxemia baseline 2 L/min supplemental oxygen at home, recurrent episodes of hypoxemia, ruptured left quadricep tendon, thrombocytopenia, hyperkalemia, morbid obesity history of anasarca dyslipidemia, and recent admission for acute CHF exacerbation who was brought in by EMS due to worsening altered mental status and circulatory shock.  MD assessment includes: Acute hypoxic respiratory failure in setting of pulmonary edema, pulmonary hypertension, & small bilateral pleural effusions, AKI, thrombocytopenia, leukocytosis, and acute metabolic encephalopathy vs possible hepatic encephalopathy.    PT Comments    Pt was pleasant and motivated to participate during the session and put forth good effort throughout. Pt required multi-modal cuing for proper sequencing during sit to/from stand transfer training along with +2 assist to come to full upright position.  Once in standing pt able to take several small steps before fatiguing and needing to return to sitting.  Unable to obtain pulsox reading on pt on either hand.  Pt then able to stand two additional times with max standing tolerance 60-90 sec before needing to return to sitting.  Pt will benefit from PT services in a SNF setting upon discharge to safely address deficits listed in patient problem list for decreased caregiver assistance and eventual return to PLOF.     Recommendations for follow up therapy are one component of a multi-disciplinary discharge planning process, led by the attending physician.  Recommendations may be updated based on patient status,  additional functional criteria and insurance authorization.  Follow Up Recommendations  Skilled nursing-short term rehab (<3 hours/day) Can patient physically be transported by private vehicle: No   Assistance Recommended at Discharge Frequent or constant Supervision/Assistance  Patient can return home with the following Two people to help with walking and/or transfers;Two people to help with bathing/dressing/bathroom;Assistance with cooking/housework;Direct supervision/assist for medications management;Help with stairs or ramp for entrance;Assist for transportation;Direct supervision/assist for financial management   Equipment Recommendations  Other (comment) (TBD)    Recommendations for Other Services       Precautions / Restrictions Precautions Precautions: Fall Restrictions Weight Bearing Restrictions: No Other Position/Activity Restrictions: Tunneled hemodialysis catheter via the right internal jugular vein placed 2/26     Mobility  Bed Mobility               General bed mobility comments: NT, pt found in recliner    Transfers Overall transfer level: Needs assistance Equipment used: Rolling walker (2 wheels) Transfers: Sit to/from Stand Sit to Stand: Mod assist, +2 physical assistance           General transfer comment: Mod verbal and tactile cues for hand placement and increased trunk flexion    Ambulation/Gait Ambulation/Gait assistance: Min guard Gait Distance (Feet): 2 Feet Assistive device: Rolling walker (2 wheels) Gait Pattern/deviations: Step-to pattern, Trunk flexed, Decreased step length - right, Decreased step length - left Gait velocity: decreased     General Gait Details: Pt only able to take 2-3 very small, effortful steps with heavy lean on the RW but no buckling or LOB noted   Stairs             Wheelchair Mobility    Modified Rankin (Stroke Patients Only)       Balance Overall  balance assessment: Needs  assistance Sitting-balance support: Feet unsupported, Single extremity supported Sitting balance-Leahy Scale: Fair     Standing balance support: Bilateral upper extremity supported, Reliant on assistive device for balance, During functional activity Standing balance-Leahy Scale: Fair                              Cognition Arousal/Alertness: Awake/alert Behavior During Therapy: Flat affect, WFL for tasks assessed/performed Overall Cognitive Status: Within Functional Limits for tasks assessed                                          Exercises Total Joint Exercises Ankle Circles/Pumps: Strengthening, Both, 10 reps (with manual resistance) Quad Sets: Strengthening, Both, 10 reps Towel Squeeze: Strengthening, Both, 10 reps Short Arc Quad: Strengthening, Both, 10 reps Hip ABduction/ADduction: AAROM, Strengthening, Both, 10 reps Long Arc Quad: AROM, Strengthening, Both, 10 reps Knee Flexion: AROM, Strengthening, Both, 10 reps Other Exercises Other Exercises: Multiple sit to/from stands from recliner with cuing for proper sequencing Other Exercises: Static standing 2 x 60-90 sec for improved activity tolerance    General Comments        Pertinent Vitals/Pain Pain Assessment Pain Assessment: No/denies pain    Home Living                          Prior Function            PT Goals (current goals can now be found in the care plan section) Progress towards PT goals: Progressing toward goals    Frequency    Min 2X/week      PT Plan Current plan remains appropriate    Co-evaluation              AM-PAC PT "6 Clicks" Mobility   Outcome Measure  Help needed turning from your back to your side while in a flat bed without using bedrails?: A Lot Help needed moving from lying on your back to sitting on the side of a flat bed without using bedrails?: A Lot Help needed moving to and from a bed to a chair (including a wheelchair)?:  A Lot Help needed standing up from a chair using your arms (e.g., wheelchair or bedside chair)?: A Lot Help needed to walk in hospital room?: Total Help needed climbing 3-5 steps with a railing? : Total 6 Click Score: 10    End of Session Equipment Utilized During Treatment: Oxygen Activity Tolerance: Patient tolerated treatment well Patient left: in chair;with call bell/phone within reach Nurse Communication: Mobility status PT Visit Diagnosis: Difficulty in walking, not elsewhere classified (R26.2);Muscle weakness (generalized) (M62.81)     Time: [id number] PT Time Calculation (min) (ACUTE ONLY): 29 min  Charges:  $Therapeutic Exercise: 8-22 mins $Therapeutic Activity: 8-22 mins                    D. Scott Acey [place] PT, DPT 02/17/23, 10:16 AM

## 2023-02-17 NOTE — Care Management Important Message (Signed)
Important Message  Patient Details  Name: John [name] MRN: [id number] Date of Birth: Oct 05, 1946   Medicare Important Message Given:  No Unable to attain signature      Loann Quill 02/17/2023, 4:09 PM

## 2023-02-17 NOTE — Progress Notes (Signed)
Occupational Therapy Treatment Patient Details Name: John [name] MRN: [id number] DOB: 02/24/1946 Today's Date: 02/17/2023   History of present illness Pt is 77 year old male with a history of essential HTN, stage IV CKD, chronic diastolic and systolic heart failure with cor pulmonale, moderate tricuspid regurgitation and aortic valve regurgitation, ascending aortic aneurysm, CAD, COPD and chronic hypoxemia baseline 2 L/min supplemental oxygen at home, recurrent episodes of hypoxemia, ruptured left quadricep tendon, thrombocytopenia, hyperkalemia, morbid obesity history of anasarca dyslipidemia, and recent admission for acute CHF exacerbation who was brought in by EMS due to worsening altered mental status and circulatory shock.  MD assessment includes: Acute hypoxic respiratory failure in setting of pulmonary edema, pulmonary hypertension, & small bilateral pleural effusions, AKI, thrombocytopenia, leukocytosis, and acute metabolic encephalopathy vs possible hepatic encephalopathy.   OT comments  Mr. Zelenak is making progress toward his self care goals.  He was motivated to participate in therapy this date.  OT provided setup assist for seated grooming tasks (brushing teeth, washing face).  Patient agreeable to practicing functional transfers and mobility to support independence in ADLs.  OT provided mod assist + 2 for STS transfer and ambulation with rolling walker. He took ~10 steps forward and 10 steps backward with +2 min assist for balance. Pt has guarded, effortful movement due to complaints of plantar fasciitis in R foot.  OT encouraged patient and family member to bring shoes from home to support comfort in ambulation.  Patient and family agreeable.  Mr. Barquero will likely continue to benefit from skilled OT services in acute setting to support functional strengthening, safety and independence in ADLs.  Discharge recommendation remains appropriate.   Recommendations for follow up therapy are  one component of a multi-disciplinary discharge planning process, led by the attending physician.  Recommendations may be updated based on patient status, additional functional criteria and insurance authorization.    Follow Up Recommendations  Skilled nursing-short term rehab (<3 hours/day)     Assistance Recommended at Discharge Frequent or constant Supervision/Assistance  Patient can return home with the following  Two people to help with walking and/or transfers;Two people to help with bathing/dressing/bathroom;Help with stairs or ramp for entrance;Assist for transportation;Assistance with cooking/housework   Equipment Recommendations   (defer to next level of care)    Recommendations for Other Services      Precautions / Restrictions Precautions Precautions: Fall Restrictions Weight Bearing Restrictions: No       Mobility Bed Mobility Overal bed mobility: Needs Assistance               Patient Response: Cooperative, Flat affect  Transfers Overall transfer level: Needs assistance Equipment used: Rolling walker (2 wheels) Transfers: Sit to/from Stand Sit to Stand: Mod assist, +2 physical assistance           General transfer comment: Mod verbal and tactile cues for hand placement and increased trunk flexion.  Guarded movements 2/2 reports of plantar fasciitis     Balance Overall balance assessment: Needs assistance Sitting-balance support: Feet unsupported, No upper extremity supported Sitting balance-Leahy Scale: Good     Standing balance support: Bilateral upper extremity supported, Reliant on assistive device for balance, During functional activity Standing balance-Leahy Scale: Poor Standing balance comment: Requires MOD A to maintain upright positioning.                           ADL either performed or assessed with clinical judgement   ADL Overall ADL's : Needs assistance/impaired Eating/Feeding:  Set up;Sitting   Grooming: Wash/dry  face;Wash/dry hands;Oral care;Sitting;Set up                               Functional mobility during ADLs: +2 for safety/equipment;Rolling walker (2 wheels);Minimal assistance      Extremity/Trunk Assessment Upper Extremity Assessment Upper Extremity Assessment: Generalized weakness   Lower Extremity Assessment Lower Extremity Assessment: Generalized weakness        Vision Patient Visual Report: No change from baseline     Perception     Praxis      Cognition Arousal/Alertness: Awake/alert Behavior During Therapy: Flat affect, WFL for tasks assessed/performed Overall Cognitive Status: Within Functional Limits for tasks assessed                                 General Comments: Flat affect, though polite and motivated.        Exercises Other Exercises Other Exercises: provided education re: transfer technique and DME use, assist for seated grooming tasks    Shoulder Instructions       General Comments      Pertinent Vitals/ Pain       Pain Assessment Pain Assessment: Faces Pain Score: 4  Facial Expression: Grimacing Body Movements: Protection Muscle Tension: Tense, rigid Compliance with ventilator (intubated pts.): N/A Vocalization (extubated pts.): Sighing, moaning CPOT Total: 5 Pain Descriptors / Indicators: Guarding, Grimacing Pain Intervention(s): Limited activity within patient's tolerance, Monitored during session  Home Living                                          Prior Functioning/Environment              Frequency  Min 2X/week        Progress Toward Goals  OT Goals(current goals can now be found in the care plan section)  Progress towards OT goals: Progressing toward goals  Acute Rehab OT Goals Patient Stated Goal: to increase independence OT Goal Formulation: With patient/family Time For Goal Achievement: 02/20/23 Potential to Achieve Goals: Good  Plan Discharge plan remains  appropriate;Frequency remains appropriate    Co-evaluation                 AM-PAC OT "6 Clicks" Daily Activity     Outcome Measure   Help from another person eating meals?: A Little Help from another person taking care of personal grooming?: A Little Help from another person toileting, which includes using toliet, bedpan, or urinal?: A Lot Help from another person bathing (including washing, rinsing, drying)?: A Lot Help from another person to put on and taking off regular upper body clothing?: A Little Help from another person to put on and taking off regular lower body clothing?: A Lot 6 Click Score: 15    End of Session Equipment Utilized During Treatment: Gait belt;Rolling walker (2 wheels)  OT Visit Diagnosis: Unsteadiness on feet (R26.81);Repeated falls (R29.6);Muscle weakness (generalized) (M62.81)   Activity Tolerance Patient tolerated treatment well   Patient Left in chair;with call bell/phone within reach;with family/visitor present   Nurse Communication          Time: [id number] OT Time Calculation (min): 22 min  Charges: OT General Charges $OT Visit: 1 Visit OT Treatments $Self Care/Home Management : 8-22 mins  Shirlee Limerick  Kydan Shanholtzer, OTR/L 02/17/23, 4:06 PM

## 2023-02-17 NOTE — Op Note (Signed)
Operative Note     Preoperative diagnosis:   1. ESRD with return of renal function  Postoperative diagnosis:  1. ESRD with return of renal function  Procedure:  Removal of right jugular Permcath  Surgeon:  Leotis Pain, MD  Anesthesia:  Local  EBL:  Minimal  Indication for the Procedure:  The patient has had return of renal function and no longer needs their permcath.  This can be removed.  Risks and benefits are discussed and informed consent is obtained.  Description of the Procedure:  The patient's right neck, chest and existing catheter were sterilely prepped and draped. The area around the catheter was anesthetized copiously with 1% lidocaine. The catheter was dissected out with curved hemostats until the cuff was freed from the surrounding fibrous sheath. The fiber sheath was transected, and the catheter was then removed in its entirety using gentle traction. Pressure was held and sterile dressings were placed. The patient tolerated the procedure well and was taken to the recovery room in stable condition.     Leotis Pain  02/17/2023, 3:34 PM This note was created with Dragon Medical transcription system. Any errors in dictation are purely unintentional.

## 2023-02-17 NOTE — Progress Notes (Signed)
Progress Note   Patient: John [name] [id number] DOB: 03-14-1946 DOA: 01/29/2023     19 DOS: the patient was seen and examined on 02/17/2023   Subjective: Patient seen and examined at bedside this morning He tells me he is feeling better today Case managers have been informed about the need for skilled nursing facility placement Patient currently medically stable for discharge pending skilled nursing facility bed Denies nausea vomiting abdominal pain or chest pain   Brief Narrative:  77 year old male with a history of essential hypertension, on hydralazine and Cozaar, stage IV CKD but has not been on dialysis in the past, has chronic diastolic and systolic heart failure with cor pulmonale, moderate tricuspid regurgitation and aortic valve regurgitation, history of ascending aortic aneurysm, coronary artery disease with atherosclerosis, COPD and chronic hypoxemia baseline 2 L/min supplemental oxygen at home with recurrent episodes of hypoxemia and remote acute exacerbation of COPD with hypoxemia and hypercapnia 2 months ago history of ruptured left quadricep tendon 2 years ago, chronic thrombocytopenia and erythrocytosis recurrent lower extremity cellulitis, recurrent hyperkalemia, morbid obesity history of anasarca dyslipidemia, recent admission for acute CHF exacerbation requiring Lasix drip and BiPAP support, who was brought in by EMS due to worsening altered mental status and circulatory shock.  Patient received CRRT.  He also required intubation on account of worsening respiratory function.  Currently extubated.  He made decision for no dialysis and as such dialysis catheter was removed. Patient had discussion with the girlfriend and later on requested to be on dialysis.  Dialysis catheter was then replaced and dialysis was resumed.  Later nephrologist recommended removal of dialysis catheter given renal recovery at this time  Assessment & Plan:   Principal Problem:   Shock circulatory  ([place]) Active Problems:   Acute on chronic congestive heart failure (HCC)   Acute hypoxic respiratory failure (HCC)   Toxic metabolic encephalopathy   Circulatory shock  Acute decompensated HFpEF and right-sided heart failure  RV dysfunction and dilation on echocardiography, concerning for RV failure and pulmonary hypertension. Porto-pulmonary hypertension on the differential given history of liver cirrhosis. Was on CVVH for ultra-filtration for volume removal Plan: Cardiology has signed off.   No further diagnostics from their standpoint. Nephrology is on board we appreciate input Nephrologist have recommended removal of dialysis catheter given renal recovery at this time   Acute Hypoxic Respiratory Failure Patient extubated successfully Successfully extubated to nasal cannula on 2/22.  Doing well overall.  Currently requiring 3 L of intranasal oxygen  monitor oxygen and continue to wean as tolerated   Toxic Metabolic Encephalopathy Mental status approaching baseline.  Mental status and in the setting of renal failure, psychotropic medications.  Waxing and waning   Acute kidney injury on chronic kidney disease stage IV Was on CRRT given hypotension. Nephrologist have recommended removal of dialysis catheter given renal recovery at this time Patient receiving intermittent Lasix as recommended by nephrologist.  Last dose 02/13/2023   Hepatic cirrhosis Thrombocytopenia No acute issues.  Does confer a poor prognosis.  Was on cefepime empirically given leukocytosis.  Cultures remain negative.  Finished 5 days of antibiotics.  Now discontinued.   DVT prophylaxis: SCD   Code Status: DNR   Family Communication: Plan of care discussed with patient   Disposition Plan: Status is: Inpatient   Remains inpatient appropriate because: Resolving respiratory failure and encephalopathy.  Patient pending discharge to skilled nursing facility     Physical Exam: General exam: Appears fatigued  and frail.  Poor dentition. Respiratory system: Crackles  improved normal work of breathing.  3L of intranasal oxygen Cardiovascular system: Tachycardic, regular rhythm, no murmurs Gastrointestinal system: Soft, NT/ND, normal bowel sounds Central nervous system: Awake and alert, oriented x 2, no focal deficits Extremities: Decreased power bilateral lower extremities Skin: Right mid back wound Psychiatry: Judgement and insight appear normal. Mood & affect appropriate.   Vitals:   02/17/23 0517 02/17/23 0734 02/17/23 1237 02/17/23 1628  BP: 117/79 1'00/70 99/71 95/76 '$  Pulse: 78 87 82 84  Resp: 18     Temp: 98.3 F (36.8 C) (!) 97.4 F (36.3 C) 97.9 F (36.6 C) 98 F (36.7 C)  TempSrc:  Oral Oral Oral  SpO2: 97% 99% 94% 100%  Weight:      Height:       I spent a total of 35 minutes  Author: Verline Lema, MD 02/17/2023 4:55 PM  For on call review [email or web address].

## 2023-02-18 ENCOUNTER — Encounter: Payer: Self-pay | Admitting: Vascular Surgery

## 2023-02-18 DIAGNOSIS — I509 Heart failure, unspecified: Secondary | ICD-10-CM | POA: Diagnosis not present

## 2023-02-18 DIAGNOSIS — Z7189 Other specified counseling: Secondary | ICD-10-CM | POA: Diagnosis not present

## 2023-02-18 DIAGNOSIS — R579 Shock, unspecified: Secondary | ICD-10-CM | POA: Diagnosis not present

## 2023-02-18 DIAGNOSIS — N179 Acute kidney failure, unspecified: Secondary | ICD-10-CM | POA: Diagnosis not present

## 2023-02-18 LAB — CBC WITH DIFFERENTIAL/PLATELET
Abs Immature Granulocytes: 0.06 10*3/uL (ref 0.00–0.07)
Basophils Absolute: 0 10*3/uL (ref 0.0–0.1)
Basophils Relative: 0 %
Eosinophils Absolute: 0.1 10*3/uL (ref 0.0–0.5)
Eosinophils Relative: 1 %
HCT: 37.5 % — ABNORMAL LOW (ref 39.0–52.0)
Hemoglobin: 11.9 g/dL — ABNORMAL LOW (ref 13.0–17.0)
Immature Granulocytes: 1 %
Lymphocytes Relative: 11 %
Lymphs Abs: 0.7 10*3/uL (ref 0.7–4.0)
MCH: 32.3 pg (ref 26.0–34.0)
MCHC: 31.7 g/dL (ref 30.0–36.0)
MCV: 101.9 fL — ABNORMAL HIGH (ref 80.0–100.0)
Monocytes Absolute: 0.4 10*3/uL (ref 0.1–1.0)
Monocytes Relative: 7 %
Neutro Abs: 4.9 10*3/uL (ref 1.7–7.7)
Neutrophils Relative %: 80 %
Platelets: 184 10*3/uL (ref 150–400)
RBC: 3.68 MIL/uL — ABNORMAL LOW (ref 4.22–5.81)
RDW: 14.4 % (ref 11.5–15.5)
WBC: 6.1 10*3/uL (ref 4.0–10.5)
nRBC: 0 % (ref 0.0–0.2)

## 2023-02-18 LAB — RENAL FUNCTION PANEL
Albumin: 2.5 g/dL — ABNORMAL LOW (ref 3.5–5.0)
Anion gap: 3 — ABNORMAL LOW (ref 5–15)
BUN: 51 mg/dL — ABNORMAL HIGH (ref 8–23)
CO2: 31 mmol/L (ref 22–32)
Calcium: 9.1 mg/dL (ref 8.9–10.3)
Chloride: 101 mmol/L (ref 98–111)
Creatinine, Ser: 2.11 mg/dL — ABNORMAL HIGH (ref 0.61–1.24)
GFR, Estimated: 32 mL/min — ABNORMAL LOW (ref >60)
Glucose, Bld: 97 mg/dL (ref 70–99)
Phosphorus: 3.3 mg/dL (ref 2.5–4.6)
Potassium: 3.9 mmol/L (ref 3.5–5.1)
Sodium: 135 mmol/L (ref 135–145)

## 2023-02-18 LAB — MAGNESIUM: Magnesium: 2.1 mg/dL (ref 1.7–2.4)

## 2023-02-18 MED ORDER — ORAL CARE MOUTH RINSE: 15.0000 mL | OROMUCOSAL | Status: DC | PRN

## 2023-02-18 NOTE — TOC Progression Note (Signed)
Transition of Care [facility]) - Progression Note    Patient Details  Name: John [name] MRN: [id number] Date of Birth: 1946-11-14  [facility] of [facility]) CM/SW Contact  Laurena Slimmer, RN Phone Number: 02/18/2023, 2:01 PM  Clinical Narrative:    Spoke with patient's friend, [name] to give bed offer. She is not agreeable to [facility]. She stated that is too far. She is open to [facility] She and patient were interested in [facility] . Offer is pending Location manager at [facility] to provide update regarding dialysis.    Expected Discharge Plan:  (TBD) Barriers to Discharge: Continued Medical Work up  Expected Discharge Plan and Services   Discharge Planning Services: CM Consult   Living arrangements for the past 2 months: Single Family Home                 DME Arranged: N/A                     Social Determinants of Health (SDOH) Interventions SDOH Screenings   Food Insecurity: No Food Insecurity (01/29/2023)  Housing: Low Risk  (01/29/2023)  Transportation Needs: No Transportation Needs (01/29/2023)  Utilities: Not At Risk (01/29/2023)  Alcohol Screen: Low Risk  (07/17/2022)  Depression (PHQ2-9): Low Risk  (07/30/2022)  Financial Resource Strain: Low Risk  (07/17/2022)  Physical Activity: Sufficiently Active (07/17/2022)  Social Connections: Socially Integrated (07/17/2022)  Stress: No Stress Concern Present (07/17/2022)  Tobacco Use: Medium Risk (02/18/2023)    Readmission Risk Interventions    01/30/2023   11:09 AM  Readmission Risk Prevention Plan  Transportation Screening Complete  Medication Review ([place]) Complete  PCP or Specialist appointment within 3-5 days of discharge Complete  HRI or [place] Complete  SW Recovery Care/Counseling Consult Complete  Palliative Care Screening Complete  [place] Not Applicable

## 2023-02-18 NOTE — Progress Notes (Signed)
Progress Note   Patient: John [name] [id number] DOB: 12-05-1946 DOA: 01/29/2023     20 DOS: the patient was seen and examined on 02/18/2023    Subjective: Patient seen and examined at bedside this morning He tells me he is feeling better today Case managers have been informed about the need for skilled nursing facility placement Patient currently medically stable for discharge pending skilled nursing facility bed Denies nausea vomiting abdominal pain or chest pain   Brief Narrative:  77 year old male with a history of essential hypertension, on hydralazine and Cozaar, stage IV CKD but has not been on dialysis in the past, has chronic diastolic and systolic heart failure with cor pulmonale, moderate tricuspid regurgitation and aortic valve regurgitation, history of ascending aortic aneurysm, coronary artery disease with atherosclerosis, COPD and chronic hypoxemia baseline 2 L/min supplemental oxygen at home with recurrent episodes of hypoxemia and remote acute exacerbation of COPD with hypoxemia and hypercapnia 2 months ago history of ruptured left quadricep tendon 2 years ago, chronic thrombocytopenia and erythrocytosis recurrent lower extremity cellulitis, recurrent hyperkalemia, morbid obesity history of anasarca dyslipidemia, recent admission for acute CHF exacerbation requiring Lasix drip and BiPAP support, who was brought in by EMS due to worsening altered mental status and circulatory shock.  Patient received CRRT.  He also required intubation on account of worsening respiratory function.  Currently extubated.  He made decision for no dialysis and as such dialysis catheter was removed. Patient had discussion with the girlfriend and later on requested to be on dialysis.  Dialysis catheter was then replaced and dialysis was resumed.  Later nephrologist recommended removal of dialysis catheter given renal recovery at this time.  Patient is currently medically stable pending placement in a  skilled nursing facility   Assessment & Plan:   Principal Problem:   Shock circulatory ([place]) Active Problems:   Acute on chronic congestive heart failure ([place])   Acute hypoxic respiratory failure (HCC)   Toxic metabolic encephalopathy   Circulatory shock  Acute decompensated HFpEF and right-sided heart failure  RV dysfunction and dilation on echocardiography, concerning for RV failure and pulmonary hypertension. Porto-pulmonary hypertension on the differential given history of liver cirrhosis. Was on CVVH for ultra-filtration for volume removal Plan: Cardiology has signed off.   No further diagnostics from their standpoint. Nephrology is on board we appreciate input Nephrologist recommended removal of dialysis catheter   Acute Hypoxic Respiratory Failure Patient extubated successfully Successfully extubated to nasal cannula on 2/22.  Doing well overall.  Currently requiring 3 L of intranasal oxygen  monitor oxygen and continue to wean as tolerated   Toxic Metabolic Encephalopathy Mental status approaching baseline.  Mental status and in the setting of renal failure, psychotropic medications.  Waxing and waning   Acute kidney injury on chronic kidney disease stage IV Was on CRRT given worsening renal failure with hypotension-now resolved Dialysis catheter has been removed Patient receiving intermittent Lasix as recommended by nephrologist.  Last dose 02/13/2023   Hepatic cirrhosis Thrombocytopenia No acute issues.  Does confer a poor prognosis.  Was on cefepime empirically given leukocytosis.  Cultures remain negative.  Finished 5 days of antibiotics.  Now discontinued.   DVT prophylaxis: SCD   Code Status: DNR   Family Communication: Plan of care discussed with patient   Disposition Plan: Status is: Inpatient   Remains inpatient appropriate because: Resolving respiratory failure and encephalopathy.  Patient pending discharge to skilled nursing facility     Physical  Exam: General exam: Appears fatigued and frail.  Poor dentition. Respiratory system: Crackles improved normal work of breathing.  3L of intranasal oxygen Cardiovascular system: Tachycardic, regular rhythm, no murmurs Gastrointestinal system: Soft, NT/ND, normal bowel sounds Central nervous system: Awake and alert, oriented x 2, no focal deficits Extremities: Decreased power bilateral lower extremities Skin: Right mid back wound Psychiatry: Judgement and insight appear normal. Mood & affect appropriate.     Physical Exam: Vitals:   02/18/23 0700 02/18/23 0848 02/18/23 1211 02/18/23 1623  BP:  1'11/80 94/67 94/63 '$  Pulse:  72 64 83  Resp:  '18 16 18  '$ Temp:  97.7 F (36.5 C) 97.6 F (36.4 C) 98.6 F (37 C)  TempSrc:      SpO2:  95% 95% 98%  Weight: 76.4 kg     Height:         Author: Verline Lema, MD 02/18/2023 4:38 PM  For on call review [email or web address].

## 2023-02-18 NOTE — Progress Notes (Signed)
Physical Therapy Treatment Patient Details Name: John [name] MRN: [id number] DOB: Oct 10, 1946 Today's Date: 02/18/2023   History of Present Illness Pt is 77 year old male with a history of essential HTN, stage IV CKD, chronic diastolic and systolic heart failure with cor pulmonale, moderate tricuspid regurgitation and aortic valve regurgitation, ascending aortic aneurysm, CAD, COPD and chronic hypoxemia baseline 2 L/min supplemental oxygen at home, recurrent episodes of hypoxemia, ruptured left quadricep tendon, thrombocytopenia, hyperkalemia, morbid obesity history of anasarca dyslipidemia, and recent admission for acute CHF exacerbation who was brought in by EMS due to worsening altered mental status and circulatory shock.  MD assessment includes: Acute hypoxic respiratory failure in setting of pulmonary edema, pulmonary hypertension, & small bilateral pleural effusions, AKI, thrombocytopenia, leukocytosis, and acute metabolic encephalopathy vs possible hepatic encephalopathy.    PT Comments    Pt was up in recliner on arrival, looking tired but eager to work with PT.  He had generally flat affect but was able to follow all instructions and moved more and better than he has been able to in quite some time.  He was able to ambulate ~12 ft with very slow, labored but safe effort.  He did not have any overt LOBs or safety issues but did fatigue with the modest effort and request to sit.  He was on 2L O2 t/o the session with sats in the high 80s to low 90s.  Pt was eager to do another bout of ambulation after some seated exercises but after multiple reps of a few seated exercises endorsed being too tired to try another bout of ambulation despite the desire to do so.  Pt making gains, remains far from baseline - continue with PT POC.     Recommendations for follow up therapy are one component of a multi-disciplinary discharge planning process, led by the attending physician.  Recommendations may be updated  based on patient status, additional functional criteria and insurance authorization.  Follow Up Recommendations  Skilled nursing-short term rehab (<3 hours/day) Can patient physically be transported by private vehicle: No   Assistance Recommended at Discharge Frequent or constant Supervision/Assistance  Patient can return home with the following Assistance with cooking/housework;Direct supervision/assist for medications management;Help with stairs or ramp for entrance;Assist for transportation;Direct supervision/assist for financial management;A lot of help with walking and/or transfers;A lot of help with bathing/dressing/bathroom   Equipment Recommendations   (TBD at rehab)    Recommendations for Other Services       Precautions / Restrictions Precautions Precautions: Fall Restrictions Weight Bearing Restrictions: No     Mobility  Bed Mobility               General bed mobility comments: NT, pt found in recliner    Transfers Overall transfer level: Needs assistance Equipment used: Rolling walker (2 wheels) Transfers: Sit to/from Stand Sit to Stand: Mod assist, Min assist           General transfer comment: heavy cuing for U&LE set up and sequencing - assist to rise and keep weight shifting forward    Ambulation/Gait Ambulation/Gait assistance: Min assist Gait Distance (Feet): 12 Feet Assistive device: Rolling walker (2 wheels)         General Gait Details: Pt with exceedingly slow cadence, but showed great motivation to do as much as he can.  He was on 2L O2 during the effort, difficult to get good pulseox readings, but appeared to stay in the 88-92% range.   Stairs  Wheelchair Mobility    Modified Rankin (Stroke Patients Only)       Balance Overall balance assessment: Needs assistance Sitting-balance support: Feet unsupported, No upper extremity supported Sitting balance-Leahy Scale: Good Sitting balance - Comments: Pt with  hunched posture w/o back support, but able to maintain sitting balance w/o excessive UE use   Standing balance support: Bilateral upper extremity supported, Reliant on assistive device for balance, During functional activity Standing balance-Leahy Scale: Poor Standing balance comment: heavy lean on walker/UEs, relatively quick to fatigue with no LOBs but guarded - cautious effort in standing                            Cognition Arousal/Alertness: Awake/alert Behavior During Therapy: Flat affect, WFL for tasks assessed/performed Overall Cognitive Status: Within Functional Limits for tasks assessed                                 General Comments: Flat affect, though polite and motivated.        Exercises Total Joint Exercises Ankle Circles/Pumps: Strengthening, Both, 10 reps Quad Sets: Strengthening, Both, 10 reps Heel Slides: AAROM, 10 reps (with lightly resisted leg ext) Hip ABduction/ADduction: AAROM, Strengthening, 10 reps    General Comments        Pertinent Vitals/Pain Pain Assessment Pain Assessment: Faces Faces Pain Scale: Hurts little more Pain Location: L heel/ankle    Home Living                          Prior Function            PT Goals (current goals can now be found in the care plan section) Progress towards PT goals: Progressing toward goals    Frequency    Min 2X/week      PT Plan Current plan remains appropriate    Co-evaluation              AM-PAC PT "6 Clicks" Mobility   Outcome Measure  Help needed turning from your back to your side while in a flat bed without using bedrails?: A Lot Help needed moving from lying on your back to sitting on the side of a flat bed without using bedrails?: A Lot Help needed moving to and from a bed to a chair (including a wheelchair)?: A Lot Help needed standing up from a chair using your arms (e.g., wheelchair or bedside chair)?: A Lot Help needed to walk in  hospital room?: A Lot Help needed climbing 3-5 steps with a railing? : Total 6 Click Score: 11    End of Session Equipment Utilized During Treatment: Oxygen Activity Tolerance: Patient limited by fatigue Patient left: in chair;with call bell/phone within reach Nurse Communication: Mobility status PT Visit Diagnosis: Difficulty in walking, not elsewhere classified (R26.2);Muscle weakness (generalized) (M62.81)     Time: [id number] PT Time Calculation (min) (ACUTE ONLY): 25 min  Charges:  $Gait Training: 8-22 mins $Therapeutic Exercise: 8-22 mins                     Kreg Shropshire, DPT 02/18/2023, 4:39 PM

## 2023-02-18 NOTE — Progress Notes (Signed)
[name]  ROUNDING NOTE   Subjective:   [name] was admitted to [facility] on 01/29/2023 for Peripheral edema [R60.9] Shock circulatory ([place]) [R57.9] Acute on chronic congestive heart failure, unspecified heart failure type ([place]) [I50.9] Acute renal failure superimposed on chronic kidney disease, unspecified acute renal failure type, unspecified CKD stage ([place]) [N17.9, N18.9]  Patient sitting up in chair Upset that his breakfast was taken before he could finish.  Denies shortness of breath   Creatinine 2.30 Urine output 1.1L       Objective:  Vital signs in last 24 hours:  Temp:  [97.5 F (36.4 C)-98 F (36.7 C)] 97.7 F (36.5 C) (03/05 0848) Pulse Rate:  [72-85] 72 (03/05 0848) Resp:  [18-20] 18 (03/05 0848) BP: (95-121)/(71-84) 111/80 (03/05 0848) SpO2:  [94 %-100 %] 95 % (03/05 0848) Weight:  [76.4 kg] 76.4 kg (03/05 0700)  Weight change:  Filed Weights   02/15/23 1141 02/16/23 0500 02/18/23 0700  Weight: 77 kg 74.7 kg 76.4 kg    Intake/Output: I/O last 3 completed shifts: In: 237 [P.O.:237] Out: 1025 [Urine:1025]   Intake/Output this shift:  Total I/O In: -  Out: 200 [Urine:200]  Physical Exam: General: NAD  Head: Normocephalic, atraumatic   Eyes: Anicteric  Lungs:  Diminished bases, normal effort, 2 L [place] O2  Heart: regular  Abdomen:  Soft, nontender  Extremities: No peripheral edema   Neurologic: Alert and oriented  Skin: Left lower leg wound in clean and dry dressing  Access: Rt permcath    Basic Metabolic Panel: Recent Labs  Lab 02/14/23 0528 02/15/23 0608 02/15/23 0609 02/16/23 0400 02/16/23 0403 02/17/23 0542 02/17/23 0543 02/18/23 0730 02/18/23 0731  NA 141   < > 137 136 137  --  139  --  135  K 3.6   < > 3.6 3.7 3.7  --  3.7  --  3.9  CL 100   < > 101 98 99  --  101  --  101  CO2 35*   < > '28 30 29  '$ --  31  --  31  GLUCOSE 107*   < > 110* 110* 113*  --  105*  --  97  BUN 61*   < > 58* 56* 60*  --  51*  --  51*   CREATININE 2.49*   < > 2.25* 2.49* 2.45*  --  2.30*  --  2.11*  CALCIUM 9.1   < > 9.1 9.0 9.1  --  9.1  --  9.1  MG 2.5*  --  2.2  --  2.3 2.2  --  2.1  --   PHOS 3.6  --  3.3  --  3.2  --  2.9  --  3.3   < > = values in this interval not displayed.     Liver Function Tests: Recent Labs  Lab 02/12/23 0319 02/13/23 0433 02/14/23 0528 02/15/23 0609 02/16/23 0403 02/17/23 0543 02/18/23 0731  AST 26 26  --   --   --   --   --   ALT 23 21  --   --   --   --   --   ALKPHOS 105 108  --   --   --   --   --   BILITOT 1.6* 1.5*  --   --   --   --   --   PROT 5.6* 5.8*  --   --   --   --   --  ALBUMIN 2.4*  2.4* 2.4* 2.5* 2.4* 2.5* 2.5* 2.5*    No results for input(s): "LIPASE", "AMYLASE" in the last 168 hours. No results for input(s): "AMMONIA" in the last 168 hours.   CBC: Recent Labs  Lab 02/13/23 0433 02/15/23 0609 02/16/23 0403 02/17/23 0542 02/18/23 0730  WBC 9.4 8.6 9.2 7.7 6.1  NEUTROABS 7.7 7.3 7.6 6.2 4.9  HGB 12.2* 11.2* 11.6* 11.8* 11.9*  HCT 39.5 35.8* 37.1* 38.1* 37.5*  MCV 103.9* 103.5* 103.3* 103.3* 101.9*  PLT 159 175 186 197 184     Cardiac Enzymes: No results for input(s): "CKTOTAL", "CKMB", "CKMBINDEX", "TROPONINI" in the last 168 hours.  BNP: Invalid input(s): "POCBNP"  CBG: No results for input(s): "GLUCAP" in the last 168 hours.   Microbiology: Results for orders placed or performed during the hospital encounter of 01/29/23  Culture, Respiratory w Gram Stain     Status: None   Collection Time: 01/29/23  3:45 AM   Specimen: Tracheal Aspirate; Respiratory  Result Value Ref Range Status   Specimen Description   Final    TRACHEAL ASPIRATE Performed at [facility], [address] [place], [place] [address]    Special Requests   Final    NONE Performed at [facility], [address] [place], [place] [address]    Gram Stain   Final    MODERATE WBC PRESENT,BOTH PMN AND MONONUCLEAR NO ORGANISMS SEEN    Culture    Final    RARE Normal respiratory flora-no Staph aureus or Pseudomonas seen Performed at [facility] [address], [place], [place] [address]    Report Status 02/01/2023 FINAL  Final  Culture, blood (Routine X 2) w Reflex to ID Panel     Status: None   Collection Time: 01/29/23  1:10 PM   Specimen: BLOOD  Result Value Ref Range Status   Specimen Description BLOOD BLOOD LEFT ARM  Final   Special Requests   Final    BOTTLES DRAWN AEROBIC AND ANAEROBIC Blood Culture adequate volume   Culture   Final    NO GROWTH 5 DAYS Performed at [facility], [address] [place], [place] [address]    Report Status 02/03/2023 FINAL  Final  MRSA Next Gen by PCR, Nasal     Status: None   Collection Time: 01/29/23  1:47 PM   Specimen: Nasal Mucosa; Nasal Swab  Result Value Ref Range Status   MRSA by PCR Next Gen NOT DETECTED NOT DETECTED Final    Comment: (NOTE) The GeneXpert MRSA Assay (FDA approved for NASAL specimens only), is one component of a comprehensive MRSA colonization surveillance program. It is not intended to diagnose MRSA infection nor to guide or monitor treatment for MRSA infections. Test performance is not FDA approved in patients less than 78 years old. Performed at [facility], [address] [place], [place] [address]   Culture, blood (Routine X 2) w Reflex to ID Panel     Status: None   Collection Time: 01/29/23  2:10 PM   Specimen: BLOOD  Result Value Ref Range Status   Specimen Description BLOOD A-LINE  Final   Special Requests   Final    BOTTLES DRAWN AEROBIC AND ANAEROBIC Blood Culture adequate volume   Culture   Final    NO GROWTH 5 DAYS Performed at [facility], [address], [place], [place] [address]    Report Status 02/03/2023 FINAL  Final    Coagulation Studies: No  results for input(s): "LABPROT", "INR" in the last 72 hours.   Urinalysis: No results for input(s): "COLORURINE", "LABSPEC", "PHURINE",  "GLUCOSEU", "HGBUR", "BILIRUBINUR", "KETONESUR", "PROTEINUR", "UROBILINOGEN", "NITRITE", "LEUKOCYTESUR" in the last 72 hours.  Invalid input(s): "APPERANCEUR"     Imaging: PERIPHERAL VASCULAR CATHETERIZATION  Result Date: 02/17/2023 See surgical note for result.    Medications:    sodium chloride Stopped (02/11/23 0700)     (feeding supplement) PROSource Plus  30 mL Oral TID BM   ascorbic acid  500 mg Oral BID   aspirin  81 mg Oral Daily   Chlorhexidine Gluconate Cloth  6 each Topical Q0600   docusate sodium  100 mg Oral BID   folic acid  1 mg Oral Daily   heparin injection (subcutaneous)  5,000 Units Subcutaneous Q8H   metoprolol tartrate  12.5 mg Oral BID   multivitamin  1 tablet Oral QHS   mouth rinse  15 mL Mouth Rinse 4 times per day   thiamine  100 mg Oral Daily   diclofenac Sodium, docusate sodium, mouth rinse, oxyCODONE, polyethylene glycol  Assessment/ Plan:  [name] is a 77 y.o. black male with chronic diastolic congestive heart failure, hypertension, COPD, hyperlipidemia, who is admitted to [facility] on 01/29/2023 for Peripheral edema [R60.9] Shock circulatory ([place]) [R57.9] Acute on chronic congestive heart failure, unspecified heart failure type ([place]) [I50.9] Acute renal failure superimposed on chronic kidney disease, unspecified acute renal failure type, unspecified CKD stage ([place]) [N17.9, N18.9]  Acute Kidney Injury on chronic kidney disease stage IV: baseline creatinine of 2.67, GFR of 24 on 01/06/23. History of bland urine. Acute kidney injury most likely secondary to cardiorenal syndrome. Chronic kidney disease secondary to hypertension. Required CRRT from 2/16 - 2/20. Intermitted hemodialysis on 2/21 with hypotension and thrombosis. Did not tolerate treatment. The next day, patient said he did not want dialysis and his dialysis catheter was removed. However on 2/23, patient says he is open to dialysis if necessary.  Renal function remains stable.   Appreciate vascular surgery for removing permcath on 02/17/23.  Diuretics as needed Will need follow up appt in our office at discharge. Would recommend discharging on Torsemide '40mg'$  daily and we'll follow up in office.     Acute exacerbation of chronic diastolic congestive heart failure and acute respiratory failure requiring intubation and mechanical ventilation.  Extubated on 02/05/23. Home regimen was torsemide '60mg'$  daily and metolazone 2.'5mg'$  twice a week.   Remains on 2 L.  Diuretics as needed.  Hypertension: with hypotension with cardiogenic shock required vasopressors on admission.  Blood pressure 111/80     LOS: 20 Jalani Rominger 3/5/202411:48 AM

## 2023-02-18 NOTE — Progress Notes (Signed)
Daily Progress Note   Patient Name: John [name]       Date: 02/18/2023 DOB: 18-Dec-1945  Age: 77 y.o. MRN#: [id number] Attending Physician: Verline Lema, MD Primary Care Physician: Alisa Graff, FNP Admit Date: 01/29/2023  Reason for Consultation/Follow-up: Establishing goals of care  HPI/Brief Hospital Review: 77 year old male with a history of HTN, stage IV CKD-has not been on dialysis prior to admission, chronic diastolic and systolic heart failure with cor pulmonale, moderate tricuspid regurgitation and aortic valve regurgitation, history of ascending aortic aneurysm, CAD with atherosclerosis, COPD and chronic hypoxemia baseline 2 L/min supplemental oxygen at home, chronic thrombocytopenia and erythrocytosis, recurrent lower extremity cellulitis, recurrent hyperkalemia with a recent admission for acute CHF exacerbation requiring Lasix drip and BiPAP support, who was brought in by EMS on 01/29/2023 due to worsening altered mental status and circulatory shock.     Required intubation, CRRT and pressor support this admission. Successfully extubated 2/21.  Review of chart: Perm-cath removed 3/4 Improved renal function-no indication for further dialysis at this time Awaiting disposition for discharge  Palliative Medicine consulted for assisting with goals of care conversations.  Subjective: Extensive chart review has been completed prior to meeting patient including labs, vital signs, imaging, progress notes, orders, and available advanced directive documents from current and previous encounters.    Visited with John [name] at his bedside. Awake and alert, sitting up in chair. No complaints today. Voices his understanding of no longer requiring dialysis at this time. Shares he is eager  to be discharged from the hospital. Shares he remains weak and acknowledges his need for ongoing therapy. Questions his ability to vote today in primary elections. Shared to contact board of elections for [facility] for further recommendations on placing his vote.  Denies acute pain or discomfort, N/V. PO intake continues to fluctuate.  PMT will follow peripherally as goals remain clear. Awaiting discharge plan. Will reengage as needs arise or support needed.  Objective:  Physical Exam Constitutional:      General: He is not in acute distress.    Appearance: He is ill-appearing.  Pulmonary:     Effort: Pulmonary effort is normal. No respiratory distress.  Skin:    General: Skin is warm and dry.  Neurological:     Mental Status: He is alert.     Motor:  Weakness present.             Vital Signs: BP 111/80 (BP Location: Left Arm)   Pulse 72   Temp 97.7 F (36.5 C)   Resp 18   Ht '5\' 11"'$  (1.803 m)   Wt 76.4 kg   SpO2 95%   BMI 23.49 kg/m  SpO2: SpO2: 95 % O2 Device: O2 Device: Nasal Cannula O2 Flow Rate: O2 Flow Rate (L/min): 2 L/min   Palliative Care Assessment & Plan   Assessment/Recommendation/Plan  DNR Awaiting discharge disposition Goals clear PMT will follow peripherally at this time  Thank you for allowing the Palliative Medicine Team to assist in the care of this patient.  Total time:  25 minutes  Time spent includes: Detailed review of medical records (labs, vital signs), medically appropriate exam (mental status, respiratory, cardiac, skin), discussed with treatment team, counseling and educating patient, family and staff, documenting clinical information, medication management and coordination of care.  John Grist, DNP, AGNP-C Palliative Medicine   Please contact Palliative Medicine Team phone at [phone] for questions and concerns.

## 2023-02-19 ENCOUNTER — Encounter
Admission: EM | Disposition: A | Discharge status: Skilled Nursing Facility | Payer: Self-pay | Source: Home / Self Care | Attending: Internal Medicine

## 2023-02-19 ENCOUNTER — Encounter: Payer: Self-pay | Admitting: Pulmonary Disease

## 2023-02-19 ENCOUNTER — Inpatient Hospital Stay: Payer: Medicare HMO | Admitting: Anesthesiology

## 2023-02-19 ENCOUNTER — Inpatient Hospital Stay: Payer: Medicare HMO

## 2023-02-19 DIAGNOSIS — R933 Abnormal findings on diagnostic imaging of other parts of digestive tract: Secondary | ICD-10-CM | POA: Diagnosis not present

## 2023-02-19 DIAGNOSIS — Z7189 Other specified counseling: Secondary | ICD-10-CM | POA: Diagnosis not present

## 2023-02-19 DIAGNOSIS — Z515 Encounter for palliative care: Secondary | ICD-10-CM | POA: Diagnosis not present

## 2023-02-19 DIAGNOSIS — R579 Shock, unspecified: Secondary | ICD-10-CM | POA: Diagnosis not present

## 2023-02-19 DIAGNOSIS — K921 Melena: Secondary | ICD-10-CM | POA: Diagnosis not present

## 2023-02-19 DIAGNOSIS — K264 Chronic or unspecified duodenal ulcer with hemorrhage: Secondary | ICD-10-CM | POA: Diagnosis not present

## 2023-02-19 HISTORY — PX: ESOPHAGOGASTRODUODENOSCOPY (EGD) WITH PROPOFOL: SHX5813

## 2023-02-19 LAB — COMPREHENSIVE METABOLIC PANEL
ALT: 24 U/L (ref 0–44)
ALT: 22 U/L (ref 0–44)
AST: 32 U/L (ref 15–41)
AST: 35 U/L (ref 15–41)
Albumin: 2.2 g/dL — ABNORMAL LOW (ref 3.5–5.0)
Albumin: 2.2 g/dL — ABNORMAL LOW (ref 3.5–5.0)
Alkaline Phosphatase: 97 U/L (ref 38–126)
Alkaline Phosphatase: 64 U/L (ref 38–126)
Anion gap: 5 (ref 5–15)
Anion gap: 7 (ref 5–15)
BUN: 70 mg/dL — ABNORMAL HIGH (ref 8–23)
BUN: 71 mg/dL — ABNORMAL HIGH (ref 8–23)
CO2: 32 mmol/L (ref 22–32)
CO2: 25 mmol/L (ref 22–32)
Calcium: 8.7 mg/dL — ABNORMAL LOW (ref 8.9–10.3)
Calcium: 7.6 mg/dL — ABNORMAL LOW (ref 8.9–10.3)
Chloride: 102 mmol/L (ref 98–111)
Chloride: 107 mmol/L (ref 98–111)
Creatinine, Ser: 2.35 mg/dL — ABNORMAL HIGH (ref 0.61–1.24)
Creatinine, Ser: 2.33 mg/dL — ABNORMAL HIGH (ref 0.61–1.24)
GFR, Estimated: 28 mL/min — ABNORMAL LOW (ref >60)
GFR, Estimated: 28 mL/min — ABNORMAL LOW (ref >60)
Glucose, Bld: 117 mg/dL — ABNORMAL HIGH (ref 70–99)
Glucose, Bld: 168 mg/dL — ABNORMAL HIGH (ref 70–99)
Potassium: 5.1 mmol/L (ref 3.5–5.1)
Potassium: 5.1 mmol/L (ref 3.5–5.1)
Sodium: 139 mmol/L (ref 135–145)
Sodium: 139 mmol/L (ref 135–145)
Total Bilirubin: 0.8 mg/dL (ref 0.3–1.2)
Total Bilirubin: 1.5 mg/dL — ABNORMAL HIGH (ref 0.3–1.2)
Total Protein: 5.1 g/dL — ABNORMAL LOW (ref 6.5–8.1)
Total Protein: 4.1 g/dL — ABNORMAL LOW (ref 6.5–8.1)

## 2023-02-19 LAB — CBC WITH DIFFERENTIAL/PLATELET
Abs Immature Granulocytes: 0.1 10*3/uL — ABNORMAL HIGH (ref 0.00–0.07)
Abs Immature Granulocytes: 0.96 10*3/uL — ABNORMAL HIGH (ref 0.00–0.07)
Basophils Absolute: 0 10*3/uL (ref 0.0–0.1)
Basophils Absolute: 0 10*3/uL (ref 0.0–0.1)
Basophils Relative: 0 %
Basophils Relative: 0 %
Eosinophils Absolute: 0.1 10*3/uL (ref 0.0–0.5)
Eosinophils Absolute: 0 10*3/uL (ref 0.0–0.5)
Eosinophils Relative: 1 %
Eosinophils Relative: 0 %
HCT: 30 % — ABNORMAL LOW (ref 39.0–52.0)
HCT: 25.2 % — ABNORMAL LOW (ref 39.0–52.0)
Hemoglobin: 9.3 g/dL — ABNORMAL LOW (ref 13.0–17.0)
Hemoglobin: 7.6 g/dL — ABNORMAL LOW (ref 13.0–17.0)
Immature Granulocytes: 1 %
Immature Granulocytes: 6 %
Lymphocytes Relative: 12 %
Lymphocytes Relative: 8 %
Lymphs Abs: 1.1 10*3/uL (ref 0.7–4.0)
Lymphs Abs: 1.3 10*3/uL (ref 0.7–4.0)
MCH: 32 pg (ref 26.0–34.0)
MCH: 29.6 pg (ref 26.0–34.0)
MCHC: 31 g/dL (ref 30.0–36.0)
MCHC: 30.2 g/dL (ref 30.0–36.0)
MCV: 103.1 fL — ABNORMAL HIGH (ref 80.0–100.0)
MCV: 98.1 fL (ref 80.0–100.0)
Monocytes Absolute: 0.7 10*3/uL (ref 0.1–1.0)
Monocytes Absolute: 1.3 10*3/uL — ABNORMAL HIGH (ref 0.1–1.0)
Monocytes Relative: 8 %
Monocytes Relative: 8 %
Neutro Abs: 7.2 10*3/uL (ref 1.7–7.7)
Neutro Abs: 12.4 10*3/uL — ABNORMAL HIGH (ref 1.7–7.7)
Neutrophils Relative %: 78 %
Neutrophils Relative %: 78 %
Platelets: 192 10*3/uL (ref 150–400)
Platelets: 129 10*3/uL — ABNORMAL LOW (ref 150–400)
RBC: 2.91 MIL/uL — ABNORMAL LOW (ref 4.22–5.81)
RBC: 2.57 MIL/uL — ABNORMAL LOW (ref 4.22–5.81)
RDW: 14.4 % (ref 11.5–15.5)
RDW: 20.1 % — ABNORMAL HIGH (ref 11.5–15.5)
WBC: 9.1 10*3/uL (ref 4.0–10.5)
WBC: 16 10*3/uL — ABNORMAL HIGH (ref 4.0–10.5)
nRBC: 0 % (ref 0.0–0.2)
nRBC: 0 % (ref 0.0–0.2)

## 2023-02-19 LAB — PREPARE RBC (CROSSMATCH)

## 2023-02-19 LAB — PHOSPHORUS: Phosphorus: 2.4 mg/dL — ABNORMAL LOW (ref 2.5–4.6)

## 2023-02-19 LAB — HEMOGLOBIN AND HEMATOCRIT, BLOOD
HCT: 25.7 % — ABNORMAL LOW (ref 39.0–52.0)
HCT: 30 % — ABNORMAL LOW (ref 39.0–52.0)
Hemoglobin: 8.3 g/dL — ABNORMAL LOW (ref 13.0–17.0)
Hemoglobin: 9.6 g/dL — ABNORMAL LOW (ref 13.0–17.0)

## 2023-02-19 LAB — GLUCOSE, CAPILLARY: Glucose-Capillary: 76 mg/dL (ref 70–99)

## 2023-02-19 LAB — APTT: aPTT: 31 seconds (ref 24–36)

## 2023-02-19 LAB — MAGNESIUM: Magnesium: 2.1 mg/dL (ref 1.7–2.4)

## 2023-02-19 LAB — PROTIME-INR
INR: 1.2 (ref 0.8–1.2)
Prothrombin Time: 14.8 seconds (ref 11.4–15.2)

## 2023-02-19 SURGERY — ESOPHAGOGASTRODUODENOSCOPY (EGD) WITH PROPOFOL
Anesthesia: General

## 2023-02-19 MED ORDER — VASOPRESSIN 20 UNIT/ML IV SOLN
INTRAVENOUS | Status: DC | PRN
  Administered 2023-02-19 (×2): 1 [IU] via INTRAVENOUS

## 2023-02-19 MED ORDER — SODIUM CHLORIDE (PF) 0.9 % IJ SOLN
PREFILLED_SYRINGE | INTRAMUSCULAR | Status: DC | PRN
  Administered 2023-02-19: 4 mL

## 2023-02-19 MED ORDER — PANTOPRAZOLE INFUSION (NEW) - SIMPLE MED
8.0000 mg/h | INTRAVENOUS | Status: DC
  Administered 2023-02-19 – 2023-02-20 (×2): 8 mg/h via INTRAVENOUS
  Filled 2023-02-19 (×2): qty 100

## 2023-02-19 MED ORDER — PANTOPRAZOLE 80MG IVPB - SIMPLE MED
80.0000 mg | Freq: Once | INTRAVENOUS | Status: AC
  Administered 2023-02-19: 80 mg via INTRAVENOUS
  Filled 2023-02-19: qty 100

## 2023-02-19 MED ORDER — EPINEPHRINE 1 MG/10ML IJ SOSY
PREFILLED_SYRINGE | INTRAMUSCULAR | Status: AC
  Filled 2023-02-19: qty 10

## 2023-02-19 MED ORDER — ALBUTEROL SULFATE HFA 108 (90 BASE) MCG/ACT IN AERS
INHALATION_SPRAY | RESPIRATORY_TRACT | Status: DC | PRN
  Administered 2023-02-19: 2 via RESPIRATORY_TRACT

## 2023-02-19 MED ORDER — PHENYLEPHRINE HCL-NACL 20-0.9 MG/250ML-% IV SOLN
INTRAVENOUS | Status: AC
  Filled 2023-02-19: qty 250

## 2023-02-19 MED ORDER — VASOPRESSIN 20 UNITS/100 ML INFUSION FOR SHOCK
0.0000 [IU]/min | INTRAVENOUS | Status: DC
  Filled 2023-02-19: qty 100

## 2023-02-19 MED ORDER — IPRATROPIUM-ALBUTEROL 0.5-2.5 (3) MG/3ML IN SOLN
3.0000 mL | Freq: Once | RESPIRATORY_TRACT | Status: AC
  Administered 2023-02-19: 3 mL via RESPIRATORY_TRACT

## 2023-02-19 MED ORDER — ALBUMIN HUMAN 5 % IV SOLN
INTRAVENOUS | Status: AC
  Filled 2023-02-19: qty 250

## 2023-02-19 MED ORDER — LACTATED RINGERS IV BOLUS: 1000.0000 mL | Freq: Once | INTRAVENOUS | Status: DC

## 2023-02-19 MED ORDER — IOHEXOL 350 MG/ML SOLN
75.0000 mL | Freq: Once | INTRAVENOUS | Status: AC | PRN
  Administered 2023-02-19: 75 mL via INTRAVENOUS

## 2023-02-19 MED ORDER — ETOMIDATE 2 MG/ML IV SOLN
INTRAVENOUS | Status: DC | PRN
  Administered 2023-02-19: 10 mg via INTRAVENOUS
  Administered 2023-02-19: 14 mg via INTRAVENOUS

## 2023-02-19 MED ORDER — NOREPINEPHRINE 4 MG/250ML-% IV SOLN
2.0000 ug/min | INTRAVENOUS | Status: DC
  Administered 2023-02-19: 2 ug/min via INTRAVENOUS
  Administered 2023-02-20: 3 ug/min via INTRAVENOUS
  Filled 2023-02-19: qty 250

## 2023-02-19 MED ORDER — SODIUM CHLORIDE 0.9% IV SOLUTION: Freq: Once | INTRAVENOUS | Status: DC

## 2023-02-19 MED ORDER — MORPHINE SULFATE (PF) 2 MG/ML IV SOLN
1.0000 mg | Freq: Once | INTRAVENOUS | Status: AC
  Administered 2023-02-19: 1 mg via INTRAVENOUS
  Filled 2023-02-19: qty 1

## 2023-02-19 MED ORDER — PHENYLEPHRINE HCL-NACL 20-0.9 MG/250ML-% IV SOLN
INTRAVENOUS | Status: DC | PRN
  Administered 2023-02-19: 40 ug/min via INTRAVENOUS

## 2023-02-19 MED ORDER — NOREPINEPHRINE 4 MG/250ML-% IV SOLN: 0.0000 ug/min | INTRAVENOUS | Status: DC

## 2023-02-19 MED ORDER — SODIUM CHLORIDE 0.9 % IV SOLN
250.0000 mL | INTRAVENOUS | Status: DC
  Administered 2023-02-19: 250 mL via INTRAVENOUS

## 2023-02-19 MED ORDER — SODIUM CHLORIDE 0.9% IV SOLUTION: Freq: Once | INTRAVENOUS | Status: AC

## 2023-02-19 MED ORDER — ALBUMIN HUMAN 5 % IV SOLN: INTRAVENOUS | Status: DC | PRN

## 2023-02-19 MED ORDER — LACTATED RINGERS IV BOLUS
1000.0000 mL | Freq: Once | INTRAVENOUS | Status: AC
  Administered 2023-02-19: 1000 mL via INTRAVENOUS

## 2023-02-19 MED ORDER — PHENYLEPHRINE HCL (PRESSORS) 10 MG/ML IV SOLN
INTRAVENOUS | Status: DC | PRN
  Administered 2023-02-19 (×3): 80 ug via INTRAVENOUS

## 2023-02-19 MED ORDER — SODIUM CHLORIDE 0.9 % IV SOLN
1.0000 g | INTRAVENOUS | Status: DC
  Administered 2023-02-19 – 2023-02-20 (×2): 1 g via INTRAVENOUS
  Filled 2023-02-19: qty 1
  Filled 2023-02-19: qty 10

## 2023-02-19 MED ORDER — ETOMIDATE 2 MG/ML IV SOLN
INTRAVENOUS | Status: AC
  Filled 2023-02-19: qty 10

## 2023-02-19 MED ORDER — FENTANYL CITRATE (PF) 100 MCG/2ML IJ SOLN
INTRAMUSCULAR | Status: AC
  Filled 2023-02-19: qty 2

## 2023-02-19 MED ORDER — IPRATROPIUM-ALBUTEROL 0.5-2.5 (3) MG/3ML IN SOLN
RESPIRATORY_TRACT | Status: AC
  Filled 2023-02-19: qty 3

## 2023-02-19 MED ORDER — SODIUM CHLORIDE 0.9 % IV SOLN: INTRAVENOUS | Status: DC

## 2023-02-19 MED ORDER — SUCCINYLCHOLINE CHLORIDE 200 MG/10ML IV SOSY
PREFILLED_SYRINGE | INTRAVENOUS | Status: DC | PRN
  Administered 2023-02-19: 120 mg via INTRAVENOUS
  Administered 2023-02-19: 100 mg via INTRAVENOUS

## 2023-02-19 NOTE — Consult Note (Signed)
GI Inpatient Consult Note  Reason for Consult: Hematochezia    Attending Requesting Consult: Neomia Glass, NP  History of Present Illness: John [name] is a 77 y.o. male seen for evaluation of hematochezia at the request of overnight hospitalist - Neomia Glass, NP. Patient has a PMH of HTN, HLD CKD Stage IV not on HD prior to admission, chronic combined diastolic and systolic heart failure, hx of AAA, CAD, COPD on supplemental O2 at home, cirrhosis of the liver, thrombocytopenia, and recurrent lower extremity cellulitis. He was admitted to [facility] 2/14 last month for AMS and circulatory shock. He received CRRT with nephology and also required intubation 2/2 worsening respiration function and extubated 2/22. He is no longer requiring CRRT. GI consulted this morning for concern of hematochezia episodes. RN reported a large maroon colored bowel movement around 0530 this morning with hypotension (88/65) and tachycardia (104). Hemoglobin found to be decreased from 11.9 yesterday to 9.3 this morning. Per nursing, he also had hematochezia episode ~0800 this morning. He was started on 1L bolus of LR, Protonix bolus and gtt, and Ceftriaxone for SBP prophylaxis.   Patient seen and examined this morning. No family or friends present at time of my exam. Patient reports he is not aware of the bloody bowel movements, but is aware of several large bowel movements this morning. He is colonoscopy naive and denies any known history of GI bleeding. He reports increased urgency to have a bowel movement. He denies any abdominal pain currently to me this morning. He denies any fevers, chills, nausea, or vomiting. He offers no other acute concerns or complaints to me this morning. He is adamant to me this morning that he does not wish to have a colonoscopy and is not wanting any invasive evaluation. He is DNR. Received message from hospitalist [phone] this morning that rapid response was called on patient due to hypotension and  another large hematochezia episode. Patient reportedly now agreeable to colonoscopy if recommended.    Past Medical History:  Past Medical History:  Diagnosis Date   CHF (congestive heart failure) (HCC)    EF 45% in 2021   Chronic kidney disease    COPD (chronic obstructive pulmonary disease) ([place]) 02/16/2020   Hyperlipidemia    Hypertension    Pneumonia 2021    Problem List: Patient Active Problem List   Diagnosis Date Noted   Toxic metabolic encephalopathy 0000000   Shock circulatory ([place]) 01/29/2023   Impaired fasting glucose 11/24/2022   Hypokalemia 11/24/2022   Acute on chronic respiratory failure with hypoxia and hypercapnia (HCC) 11/21/2022   Obesity (BMI 30-39.9) 11/21/2022   Anasarca 11/21/2022   Acute kidney injury superimposed on chronic kidney disease ([place]) 11/20/2022   Cellulitis 10/07/2022   Acute on chronic diastolic CHF (congestive heart failure) ([place]) 10/02/2022   Myocardial injury 10/02/2022   Hyperkalemia 10/02/2022   Acute hypoxic respiratory failure ([place]) 01/28/2022   Cardiorenal syndrome, stage 1-4 or unspecified chronic kidney disease, with heart failure ([place]) 01/28/2022   Acute left systolic heart failure ([place]) 01/28/2022   Cor pulmonale, acute ([place]) 01/28/2022   Renal failure syndrome 01/28/2022   CKD (chronic kidney disease), stage IV ([place]) 01/28/2022   COPD exacerbation ([place]) 01/28/2022   Cardiogenic shock ([place]) 01/28/2022   Erythrocytosis 12/20/2021   Thrombocytopenia ([place]) 12/20/2021   Acute on chronic congestive heart failure ([place]) 06/21/2020   Hypertension    COPD (chronic obstructive pulmonary disease) ([place]) 02/16/2020   Rupture of left quadriceps tendon 02/15/2020   Community acquired  pneumonia 09/01/2019   Pneumonia 09/01/2019    Past Surgical History: Past Surgical History:  Procedure Laterality Date   CATARACT EXTRACTION W/PHACO Left 10/31/2021   Procedure: CATARACT EXTRACTION PHACO AND INTRAOCULAR LENS PLACEMENT ([place]) LEFT  VISION BLUE 3.45 00:48.0;  Surgeon: Leandrew Koyanagi, MD;  Location: [facility];  Service: Ophthalmology;  Laterality: Left;   CATARACT EXTRACTION W/PHACO Right 11/14/2021   Procedure: CATARACT EXTRACTION PHACO AND INTRAOCULAR LENS PLACEMENT (IOC) RIGHT 8.59 01:10.5;  Surgeon: Leandrew Koyanagi, MD;  Location: [facility];  Service: Ophthalmology;  Laterality: Right;   DIALYSIS/PERMA CATHETER INSERTION Right 02/10/2023   Procedure: DIALYSIS/PERMA CATHETER INSERTION;  Surgeon: Algernon Huxley, MD;  Location: [facility];  Service: Cardiovascular;  Laterality: Right;   DIALYSIS/PERMA CATHETER REMOVAL Right 02/17/2023   Procedure: DIALYSIS/PERMA CATHETER REMOVAL;  Surgeon: Algernon Huxley, MD;  Location: [facility];  Service: Cardiovascular;  Laterality: Right;   INGUINAL HERNIA REPAIR Right 02/04/2017   Procedure: HERNIA REPAIR INGUINAL ADULT;  Surgeon: Leonie Green, MD;  Location: [facility];  Service: General;  Laterality: Right;   NO PAST SURGERIES     QUADRICEPS TENDON REPAIR Left 02/15/2020   Procedure: REPAIR QUADRICEP TENDON;  Surgeon: Corky Mull, MD;  Location: [facility];  Service: Orthopedics;  Laterality: Left;    Allergies: No Known Allergies  Home Medications: Medications Prior to Admission  Medication Sig Dispense Refill Last Dose   aspirin EC 81 MG tablet Take 1 tablet (81 mg total) by mouth daily. Swallow whole. 90 tablet 3 01/28/2023   calcitRIOL (ROCALTROL) 0.25 MCG capsule Take 0.25 mcg by mouth daily.   01/28/2023   cephALEXin (KEFLEX) 500 MG capsule Take 500 mg by mouth 3 (three) times daily.   01/28/2023   gabapentin (NEURONTIN) 100 MG capsule Take 1 capsule (100 mg total) by mouth 2 (two) times daily.   01/28/2023   ipratropium-albuterol (DUONEB) 0.5-2.5 (3) MG/3ML SOLN Inhale 3 mLs into the lungs every 6 (six) hours as needed (wheezing).   unknown   losartan (COZAAR) 50 MG tablet Take 50 mg by mouth daily.   01/28/2023   metolazone  (ZAROXOLYN) 2.5 MG tablet Take 1 tablet (2.5 mg total) by mouth 2 (two) times a week.   Past Week   midodrine (PROAMATINE) 2.5 MG tablet Take 2.5 mg by mouth 3 (three) times daily.   01/28/2023   omeprazole (PRILOSEC) 20 MG capsule Take 20 mg by mouth daily.   01/28/2023   potassium chloride SA (KLOR-CON M) 20 MEQ tablet Take 1 tablet (20 mEq total) by mouth daily. With extra 35mq on M/ F with metolazone 100 tablet 3 Past Week   torsemide 60 MG TABS Take 60 mg by mouth daily. 30 tablet 1 01/28/2023   Home medication reconciliation was completed with the patient.   Scheduled Inpatient Medications:    (feeding supplement) PROSource Plus  30 mL Oral TID BM   sodium chloride   Intravenous Once   ascorbic acid  500 mg Oral BID   Chlorhexidine Gluconate Cloth  6 each Topical Q0600   docusate sodium  100 mg Oral BID   folic acid  1 mg Oral Daily   metoprolol tartrate  12.5 mg Oral BID   multivitamin  1 tablet Oral QHS   thiamine  100 mg Oral Daily    Continuous Inpatient Infusions:    sodium chloride Stopped (02/11/23 0700)   cefTRIAXone (ROCEPHIN)  IV 1 g (02/19/23 [id number]   pantoprazole 8 mg/hr (02/19/23  0712)    PRN Inpatient Medications:  diclofenac Sodium, docusate sodium, mouth rinse, oxyCODONE, polyethylene glycol  Family History: family history includes Diabetes in his brother, maternal grandmother, mother, and paternal grandmother.  The patient's family history is negative for inflammatory bowel disorders, GI malignancy, or solid organ transplantation.  Social History:   reports that he quit smoking about 8 years ago. His smoking use included cigarettes. He has a 13.25 pack-year smoking history. He has never used smokeless tobacco. He reports current alcohol use. He reports that he does not use drugs. The patient denies ETOH, tobacco, or drug use.   Review of Systems: Constitutional: Weight is stable.  Eyes: No changes in vision. ENT: No oral lesions, sore throat.  GI: see HPI.   Heme/Lymph: No easy bruising.  CV: No chest pain.  GU: No hematuria.  Integumentary: No rashes.  Neuro: No headaches.  Psych: No depression/anxiety.  Endocrine: No heat/cold intolerance.  Allergic/Immunologic: No urticaria.  Resp: No cough, SOB.  Musculoskeletal: No joint swelling.    Physical Examination: BP 91/77   Pulse (!) 117   Temp 97.9 F (36.6 C)   Resp 18   Ht '5\' 11"'$  (1.803 m)   Wt 79.5 kg   SpO2 100%   BMI 24.44 kg/m  Gen: NAD, alert and oriented x 4 HEENT: PEERLA, EOMI, Neck: supple, no JVD or thyromegaly Chest: CTA bilaterally, no wheezes, crackles, or other adventitious sounds CV: RRR, no m/g/c/r Abd: soft, mildly distended, +BS in all four quadrants; nontender to palpation in all four quadrants, no HSM, guarding, ridigity, or rebound tenderness Ext: no edema, well perfused with 2+ pulses, Skin: no rash or lesions noted Lymph: no LAD  Data: Lab Results  Component Value Date   WBC 9.1 02/19/2023   HGB 8.3 (L) 02/19/2023   HCT 25.7 (L) 02/19/2023   MCV 103.1 (H) 02/19/2023   PLT 192 02/19/2023   Recent Labs  Lab 02/18/23 0730 02/19/23 0602 02/19/23 0840  HGB 11.9* 9.3* 8.3*   Lab Results  Component Value Date   NA 139 02/19/2023   K 5.1 02/19/2023   CL 102 02/19/2023   CO2 32 02/19/2023   BUN 70 (H) 02/19/2023   CREATININE 2.35 (H) 02/19/2023   Lab Results  Component Value Date   ALT 24 02/19/2023   AST 32 02/19/2023   ALKPHOS 97 02/19/2023   BILITOT 0.8 02/19/2023   Recent Labs  Lab 02/19/23 0602  APTT 31  INR 1.2    Assessment/Plan:  77 y/o AA male with a PMH of HTN, HLD CKD Stage IV not on HD prior to admission, chronic combined diastolic and systolic heart failure, hx of AAA, CAD, COPD on supplemental O2 at home, cirrhosis of the liver, thrombocytopenia, and recurrent lower extremity cellulitis admitted to [facility] 2/14 last month for AMS and circulatory shock. He did require mechanical ventilation for worsening respiratory status  and s/p extubation 2/22. GI consulted this morning for concerns of multiple episodes of hematochezia concerning for LGI bleed.   Hematochezia/Acute blood loss anemia - 3 episodes this morning of large hematochezia concerning for active GI bleeding. Hemoglobin 8.3 now has dropped over 3-grams in 24 hours. DDx includes diverticular bleed, colon polyp, colorectal neoplasm, AVMs, ischemic colitis, IBD, ulcer, etc.   Cirrhosis of the liver - unknown etiology, presumably alcoholic   Acute on chronic respiratory failure on supplemental O2  Acute decompensated HFpEF and right-sided heart failure  AKI on CKD Stage IV  Thrombocytopenia - presumably 2/2 chronic liver  disease  DNR status   Recommendations:  - Maintain 2 large bore IVs for access - Continue to monitor serial H&H. Transfuse for Hgb <7.0.  - Clinical presentation highly concerning for active GI bleed - Recommend STAT CTA to identify any active GI bleeding and consult vascular surgery if positive. If CTA is negative, next step would be colonoscopy.  - Continue supportive care with IV fluid hydration, acid suppression, antiemetics, and pain control prn - Avoid all antiplatelet or anticoagulation if medically possible - Continue management of comorbidities per primary team - Further recommendations after CTA - Prognosis is guarded. Patient is critically ill.   Thank you for the consult. Please call with questions or concerns.  Geanie Kenning, PA-C [facility] [phone]

## 2023-02-19 NOTE — Progress Notes (Signed)
PT Cancellation Note  Patient Details Name: John [name] MRN: [id number] DOB: [birth year]   Cancelled Treatment:    Reason Eval/Treat Not Completed: Other (comment) Pt had rapid response called earlier today, leading to orders for blood transfusion - currently receiving.  Will hold PT this date and continue to follow as appropriate.  Kreg Shropshire, DPT 02/19/2023, 11:55 AM

## 2023-02-19 NOTE — Progress Notes (Signed)
       CROSS COVER NOTE  NAME: John [name] MRN: [id number] DOB : May 24, 1946 ATTENDING PHYSICIAN: Verline Lema, MD    Date of Service   02/19/2023   HPI/Events of Note   Message received from RN reporting Mr Force had a large maroon colored stool. BP 88/65 HR 104. Mr Kosub endorses a feeling of urgency prior to bowel movement and subsequent incontinence. He is also endorsing generalized abdominal pain. No tenderness or increase in pain on palpation.   Interventions   Assessment/Plan:  GI Bleed PMH Thrombocytopenia PLT 184; Hepatic Cirrhosis Hold pharmacologic DVT prophylaxis Heparin, hold Aspirin, Start mechanical DVT prophylaxis SCDs Type and Screen, transfuse for HGB <8 1L LR, monitor respiratory status with reduced RV function Protonix bolus and drip SBP prophylaxis: Ceftriaxone Morphine IV x1 NPO Labs: CBC, CMP, PT/INR, PTT       (HGB 9.3 down from 11.9 in 24 hrs, PLT 192, BUN 70) GI consultation, Staff Message sent to Dr Alice Reichert and Order placed in EPIC. Thank you for your assistance and recommendations.      To reach the provider On-Call:   7AM- 7PM see care teams to locate the attending and reach out to them via [email or web address] [email or web address] 7PM-7AM contact night-coverage If you still have difficulty reaching the appropriate provider, please page the [facility] (Director on Call) for [facility] on amion for assistance  This document was prepared using Systems analyst and may include unintentional dictation errors.  Neomia Glass DNP, MBA, FNP-BC, PMHNP-BC Nurse Practitioner [facility] Pager [phone]

## 2023-02-19 NOTE — Progress Notes (Signed)
[name]  ROUNDING NOTE   Subjective:   [name] was admitted to [facility] on 01/29/2023 for Peripheral edema [R60.9] Shock circulatory ([place]) [R57.9] Acute on chronic congestive heart failure, unspecified heart failure type ([place]) [I50.9] Acute renal failure superimposed on chronic kidney disease, unspecified acute renal failure type, unspecified CKD stage ([place]) [N17.9, N18.9]  Patient found laying in bed Melena BM noted Ill appearing States he does not feel well  Hgb 9.3 from 11.9 Creatinine 2.35 Urine output 835m      Objective:  Vital signs in last 24 hours:  Temp:  [97.3 F (36.3 C)-98.6 F (37 C)] 97.6 F (36.4 C) (03/06 1049) Pulse Rate:  [83-117] 83 (03/06 1049) Resp:  [16-20] 18 (03/06 1049) BP: (62-139)/(41-124) 82/54 (03/06 1049) SpO2:  [79 %-100 %] 95 % (03/06 1037) FiO2 (%):  [2 %] 2 % (03/06 0009) Weight:  [75.5 kg-79.5 kg] 79.5 kg (03/06 0802)  Weight change: -0.9 kg Filed Weights   02/18/23 0700 02/19/23 0350 02/19/23 0802  Weight: 76.4 kg 75.5 kg 79.5 kg    Intake/Output: I/O last 3 completed shifts: In: 0  Out: 800 [Urine:800]   Intake/Output this shift:  No intake/output data recorded.  Physical Exam: General: Ill appearing, frail  Head: Normocephalic, atraumatic   Eyes: Anicteric  Lungs:  Diminished bases, normal effort, 2 L [place] O2  Heart: regular  Abdomen:  Soft, nontender  Extremities: No peripheral edema   Neurologic: Alert and oriented  Skin: Left lower leg wound in clean and dry dressing  Access: Rt permcath    Basic Metabolic Panel: Recent Labs  Lab 02/15/23 0609 02/16/23 0400 02/16/23 0403 02/17/23 0542 02/17/23 0543 02/18/23 0730 02/18/23 0731 02/19/23 0602 02/19/23 0604  NA 137 136 137  --  139  --  135 139  --   K 3.6 3.7 3.7  --  3.7  --  3.9 5.1  --   CL 101 98 99  --  101  --  101 102  --   CO2 '28 30 29  '$ --  31  --  31 32  --   GLUCOSE 110* 110* 113*  --  105*  --  97 117*  --   BUN 58* 56*  60*  --  51*  --  51* 70*  --   CREATININE 2.25* 2.49* 2.45*  --  2.30*  --  2.11* 2.35*  --   CALCIUM 9.1 9.0 9.1  --  9.1  --  9.1 8.7*  --   MG 2.2  --  2.3 2.2  --  2.1  --  2.1  --   PHOS 3.3  --  3.2  --  2.9  --  3.3  --  2.4*     Liver Function Tests: Recent Labs  Lab 02/13/23 0433 02/14/23 0528 02/15/23 0609 02/16/23 0403 02/17/23 0543 02/18/23 0731 02/19/23 0602  AST 26  --   --   --   --   --  32  ALT 21  --   --   --   --   --  24  ALKPHOS 108  --   --   --   --   --  97  BILITOT 1.5*  --   --   --   --   --  0.8  PROT 5.8*  --   --   --   --   --  5.1*  ALBUMIN 2.4*   < > 2.4* 2.5*  2.5* 2.5* 2.2*   < > = values in this interval not displayed.    No results for input(s): "LIPASE", "AMYLASE" in the last 168 hours. No results for input(s): "AMMONIA" in the last 168 hours.   CBC: Recent Labs  Lab 02/15/23 0609 02/16/23 0403 02/17/23 0542 02/18/23 0730 02/19/23 0602 02/19/23 0840  WBC 8.6 9.2 7.7 6.1 9.1  --   NEUTROABS 7.3 7.6 6.2 4.9 7.2  --   HGB 11.2* 11.6* 11.8* 11.9* 9.3* 8.3*  HCT 35.8* 37.1* 38.1* 37.5* 30.0* 25.7*  MCV 103.5* 103.3* 103.3* 101.9* 103.1*  --   PLT 175 186 197 184 192  --      Cardiac Enzymes: No results for input(s): "CKTOTAL", "CKMB", "CKMBINDEX", "TROPONINI" in the last 168 hours.  BNP: Invalid input(s): "POCBNP"  CBG: Recent Labs  Lab 02/19/23 0952  GLUCAP 66     Microbiology: Results for orders placed or performed during the hospital encounter of 01/29/23  Culture, Respiratory w Gram Stain     Status: None   Collection Time: 01/29/23  3:45 AM   Specimen: Tracheal Aspirate; Respiratory  Result Value Ref Range Status   Specimen Description   Final    TRACHEAL ASPIRATE Performed at [facility], [address] [place], [place] [address]    Special Requests   Final    NONE Performed at [facility], [address] [place], [place] [address]    Gram Stain   Final    MODERATE WBC  PRESENT,BOTH PMN AND MONONUCLEAR NO ORGANISMS SEEN    Culture   Final    RARE Normal respiratory flora-no Staph aureus or Pseudomonas seen Performed at [facility] [address], [place], [place] [address]    Report Status 02/01/2023 FINAL  Final  Culture, blood (Routine X 2) w Reflex to ID Panel     Status: None   Collection Time: 01/29/23  1:10 PM   Specimen: BLOOD  Result Value Ref Range Status   Specimen Description BLOOD BLOOD LEFT ARM  Final   Special Requests   Final    BOTTLES DRAWN AEROBIC AND ANAEROBIC Blood Culture adequate volume   Culture   Final    NO GROWTH 5 DAYS Performed at [facility], [address], [place], [place] [address]    Report Status 02/03/2023 FINAL  Final  MRSA Next Gen by PCR, Nasal     Status: None   Collection Time: 01/29/23  1:47 PM   Specimen: Nasal Mucosa; Nasal Swab  Result Value Ref Range Status   MRSA by PCR Next Gen NOT DETECTED NOT DETECTED Final    Comment: (NOTE) The GeneXpert MRSA Assay (FDA approved for NASAL specimens only), is one component of a comprehensive MRSA colonization surveillance program. It is not intended to diagnose MRSA infection nor to guide or monitor treatment for MRSA infections. Test performance is not FDA approved in patients less than 96 years old. Performed at [facility], [address] [place], [place] [address]   Culture, blood (Routine X 2) w Reflex to ID Panel     Status: None   Collection Time: 01/29/23  2:10 PM   Specimen: BLOOD  Result Value Ref Range Status   Specimen Description BLOOD A-LINE  Final   Special Requests   Final    BOTTLES DRAWN AEROBIC AND ANAEROBIC Blood Culture adequate volume   Culture   Final    NO GROWTH 5 DAYS Performed at [facility], [address] [place], [place] [address]    Report Status 02/03/2023 FINAL  Final    Coagulation Studies: Recent Labs    02/19/23 0602  LABPROT 14.8  INR 1.2     Urinalysis: No  results for input(s): "COLORURINE", "LABSPEC", "PHURINE", "GLUCOSEU", "HGBUR", "BILIRUBINUR", "KETONESUR", "PROTEINUR", "UROBILINOGEN", "NITRITE", "LEUKOCYTESUR" in the last 72 hours.  Invalid input(s): "APPERANCEUR"     Imaging: PERIPHERAL VASCULAR CATHETERIZATION  Result Date: 02/17/2023 See surgical note for result.    Medications:    sodium chloride Stopped (02/11/23 0700)   cefTRIAXone (ROCEPHIN)  IV 1 g (02/19/23 [id number])   pantoprazole 8 mg/hr (02/19/23 0712)     (feeding supplement) PROSource Plus  30 mL Oral TID BM   sodium chloride   Intravenous Once   ascorbic acid  500 mg Oral BID   Chlorhexidine Gluconate Cloth  6 each Topical Q0600   docusate sodium  100 mg Oral BID   folic acid  1 mg Oral Daily   metoprolol tartrate  12.5 mg Oral BID   multivitamin  1 tablet Oral QHS   thiamine  100 mg Oral Daily   diclofenac Sodium, docusate sodium, mouth rinse, oxyCODONE, polyethylene glycol  Assessment/ Plan:  [name] is a 77 y.o. black male with chronic diastolic congestive heart failure, hypertension, COPD, hyperlipidemia, who is admitted to [facility] on 01/29/2023 for Peripheral edema [R60.9] Shock circulatory ([place]) [R57.9] Acute on chronic congestive heart failure, unspecified heart failure type ([place]) [I50.9] Acute renal failure superimposed on chronic kidney disease, unspecified acute renal failure type, unspecified CKD stage ([place]) [N17.9, N18.9]  Acute Kidney Injury on chronic kidney disease stage IV: baseline creatinine of 2.67, GFR of 24 on 01/06/23. History of bland urine. Acute kidney injury most likely secondary to cardiorenal syndrome. Chronic kidney disease secondary to hypertension. Required CRRT from 2/16 - 2/20. Intermitted hemodialysis on 2/21 with hypotension and thrombosis. Did not tolerate treatment. The next day, patient said he did not want dialysis and his dialysis catheter was removed. However on 2/23, patient says he is open to dialysis if necessary.   Renal function remains at baseline Appreciate vascular surgery for removing permcath on 02/17/23.  Diuretics as needed Would recommend discharging on Torsemide '40mg'$  daily and we'll follow up in office.   Plans for CTA, will monitor renal function closely   Acute exacerbation of chronic diastolic congestive heart failure and acute respiratory failure requiring intubation and mechanical ventilation.  Extubated on 02/05/23. Home regimen was torsemide '60mg'$  daily and metolazone 2.'5mg'$  twice a week.   Remains on 2 L.  Diuretics as needed.  Hypertension: with hypotension with cardiogenic shock required vasopressors on admission.  Rapid response called for hypotension, 62/41. IVF ordered.    4. Anemia of chronic kidney disease with acute blood loss Lab Results  Component Value Date   HGB 8.3 (L) 02/19/2023    Hgb went from 11.9 to 9.3 with active signs of bleeding noted with Bms. Blood transfusion ordered by primary team. GI consulted, patient initially refused colonoscopy, but now agreeable.    LOS: [place] 3/6/202412:29 PM

## 2023-02-19 NOTE — Op Note (Signed)
[facility] Patient Name: John [name] Procedure Date: 02/19/2023 2:58 PM MRN: [id number] Account #: [id number] Date of Birth: 06-24-1946 Admit Type: Inpatient Age: 77 Room: [facility] ENDO ROOM 2 Gender: Male Note Status: Finalized Instrument Name: Bleederscope [id number] Procedure:             Upper GI endoscopy Indications:           Hematochezia, Melena, Abnormal CT of the GI tract Providers:             Benay Pike. Alice Reichert MD, MD Referring MD:          Gracy Bruins. Jackelyn Hoehn, NP (Referring MD) Medicines:             Propofol per Anesthesia Complications:         No immediate complications. Estimated blood loss:                         Minimal. Procedure:             Pre-Anesthesia Assessment:                        - The patient is unable to give consent secondary to                         the patient's altered mental status. The alternatives,                         risks and benefits of the procedure were discussed at                         length with the patient's Attending Physician. The                         patient's proxy verbalized understanding of the risks                         as well as the alternatives and wished to proceed with                         the procedure.                        - Patient identification and proposed procedure were                         verified prior to the procedure by the nurse.                        After obtaining informed consent, the endoscope was                         passed under direct vision. Throughout the procedure,                         the patient's blood pressure, pulse, and oxygen                         saturations were monitored continuously. The Endoscope  was introduced through the mouth, and advanced to the                         third part of duodenum. The upper GI endoscopy was                         accomplished without difficulty. The patient tolerated                          the procedure well. Findings:      The esophagus was normal.      The entire examined stomach was normal.      One non-bleeding cratered duodenal ulcer with no stigmata of bleeding       was found in the duodenal bulb. The lesion was 10 mm in largest       dimension.      One non-bleeding cratered duodenal ulcer with pigmented material was       found in the second portion of the duodenum. The lesion was 9 mm in       largest dimension.      One non-obstructing non-bleeding cratered duodenal ulcer with a visible       vessel was found in the second portion of the duodenum. The lesion was       15 mm in largest dimension. There is no evidence of perforation. Area       was successfully injected with 4 mL of a 0.1 mg/mL solution of       epinephrine for hemostasis. For hemostasis, two hemostatic clips were       successfully placed (MR conditional). Clip manufacturer: [employer]. There was no bleeding at the end of the procedure.      The exam was otherwise without abnormality. Impression:            - Normal esophagus.                        - Normal stomach.                        - Non-bleeding duodenal ulcer with no stigmata of                         bleeding.                        - Non-bleeding duodenal ulcer with pigmented material.                        - Non-obstructing non-bleeding duodenal ulcer with a                         visible vessel. There is no evidence of perforation.                         Injected. Clips (MR conditional) were placed. Clip                         manufacturer: [employer].                        -  The examination was otherwise normal.                        - No specimens collected. Recommendation:        - Return patient to ICU for ongoing care.                        - NPO today.                        - Continue present medications. Procedure Code(s):     --- Professional ---                         [phone], Esophagogastroduodenoscopy, flexible,                         transoral; with control of bleeding, any method Diagnosis Code(s):     --- Professional ---                        R93.3, Abnormal findings on diagnostic imaging of                         other parts of digestive tract                        K92.1, Melena (includes Hematochezia)                        K26.4, Chronic or unspecified duodenal ulcer with                         hemorrhage                        K26.9, Duodenal ulcer, unspecified as acute or                         chronic, without hemorrhage or perforation CPT copyright 2022 American [employer] Association. All rights reserved. The codes documented in this report are preliminary and upon coder review may  be revised to meet current compliance requirements. Efrain Sella MD, MD 02/19/2023 3:50:03 PM This report has been signed electronically. Number of Addenda: 0 Note Initiated On: 02/19/2023 2:58 PM Estimated Blood Loss:  Estimated blood loss was minimal. Estimated blood loss                         was minimal.      [facility]

## 2023-02-19 NOTE — Progress Notes (Signed)
GI care coordination note  Acuity of case discussed with attending MD, Dr. Sheppard Coil. Due to ongoing bleeding threatening life, despite hemodynamic instability,  we will plan EMERGENT endoscopy for life saving intent. Though the patient verbally agrees to procedure in endoscopy unit, he is lethargic, so DR. Anderson and I will sign the consent in his stead. He has no family in the area and earlier today asked [place] to NOT discuss with case with his girlfriend. Potential risks include worsened bleeding, infection, perforation and oversedation.  John [name], M.D. ABIM Diplomate in Gastroenterology [place]

## 2023-02-19 NOTE — Anesthesia Procedure Notes (Signed)
Procedure Name: Intubation Date/Time: 02/19/2023 4:08 PM  Performed by: Hedda Slade, CRNAPre-anesthesia Checklist: Patient identified, Patient being monitored, Timeout performed, Emergency Drugs available and Suction available Patient Re-evaluated:Patient Re-evaluated prior to induction Oxygen Delivery Method: Circle system utilized Preoxygenation: Pre-oxygenation with 100% oxygen Induction Type: IV induction Ventilation: Mask ventilation without difficulty Laryngoscope Size: 3 and McGraph Grade View: Grade I Tube type: Oral Tube size: 7.0 mm Number of attempts: 1 Airway Equipment and Method: Stylet Placement Confirmation: ETT inserted through vocal cords under direct vision, positive ETCO2 and breath sounds checked- equal and bilateral Secured at: 21 cm Tube secured with: Tape Dental Injury: Teeth and Oropharynx as per pre-operative assessment

## 2023-02-19 NOTE — Anesthesia Procedure Notes (Addendum)
Procedure Name: Intubation Date/Time: 02/19/2023 3:14 PM  Performed by: Hedda Slade, CRNAPre-anesthesia Checklist: Patient identified, Patient being monitored, Timeout performed, Emergency Drugs available and Suction available Patient Re-evaluated:Patient Re-evaluated prior to induction Oxygen Delivery Method: Circle system utilized Preoxygenation: Pre-oxygenation with 100% oxygen Induction Type: IV induction Ventilation: Mask ventilation without difficulty Laryngoscope Size: 3 and McGraph Grade View: Grade I Tube type: Oral Tube size: 7.0 mm Number of attempts: 1 Airway Equipment and Method: Stylet Placement Confirmation: ETT inserted through vocal cords under direct vision, positive ETCO2 and breath sounds checked- equal and bilateral Secured at: 22 cm Tube secured with: Tape Dental Injury: Teeth and Oropharynx as per pre-operative assessment

## 2023-02-19 NOTE — Progress Notes (Signed)
   02/19/23 1000  Spiritual Encounters  Type of Visit Initial  Referral source Trauma page  Reason for visit Code  OnCall Visit Yes  Spiritual Framework  Patient Stress Factors None identified  [place] will continue to follow   Responded to a Rapid Response page no family was present. Medical Staff was working on patient. Patient was up and alert talking to medical staff. I will follow up on patient later today to see how they are doing.

## 2023-02-19 NOTE — IPAL (Signed)
  Interdisciplinary Goals of Care Family Meeting   Date carried out: 02/19/2023  Location of the meeting:  endoscopy suite  Member's involved: Physician, Bedside Registered Nurse, and Other: endoscopy team  Durable Power of Attorney or acting medical decision maker: no HCPOA/DPOA, attending physician (myself) is acting decision maker in cooperation with patient who has limited capacity to communicate his wishes     Discussion: We discussed goals of care for [facility] .  He understands he is going to be up on a breathing machine for his EGD. I have explained to him there is a possibility that the procedure is either not successful and/or he continues to lose blood, or he may be too weak to come off the breathing machine. He reiterates NO CPR and if there is no reasonable chance of his coming off life support he would like to be made comfortable and pass peacefully. I signed consent for his EGD and Dr Alice Reichert and I agree on urgent need for procedure and that this is the most appropriate course of action given the patient's previously stated wishes and goals.   Code status:   Code Status: DNR   Disposition: Continue current acute care  Time spent for the meeting: 82 min    Emeterio Reeve, DO  02/19/2023, 3:11 PM  NOTE See: [place] 90. Medicine and Allied Occupations  [id number]. Informed consent to health care treatment or procedure. In brief summary, this statute outlines that the following persons, in order indicated, are authorized to consent to medical treatment on behalf of the patient who does not demonstrate capacity to do so: Legally assigned guardian appointed by the court > healthcare agent appointed by legal healthcare power of attorney > healthcare agent appointed by the patient > legal spouse > majority of the patient's reasonably available parents and children who are at least 19 years of age > individual who has an established relationship with  the patient, who is acting in good faith on behalf of the patient, and who can reliably convey the patient's wishes > attending physician with confirmation by another physician of the patient's condition and the necessity for treatment (unless in urgent/emergent situation)

## 2023-02-19 NOTE — Progress Notes (Signed)
NAME:  John [name], MRN:  [id number], DOB:  1946-08-19, LOS: 21 ADMISSION DATE:  01/29/2023, CONSULTATION DATE:  01/29/2023 REFERRING MD:  Dr. Cheri Fowler,  CHIEF COMPLAINT:  Altered Mental Status, worsening renal function   Brief Pt Description / Synopsis:  77 y.o. male admitted with shock (? Cardiogenic vs ? Septic), liver cirrhosis  Acute Hypoxic Respiratory Failure in the setting of Acute Decompensated HFpEF, and AKI on CKD Stage IV requiring intubation and mechanical ventilation, along with initiation of CRRT. Extubated, then developed acute GIB duodenal ulcers post op resp failure with severe hypovolumic shock associated with decompensated heart failure  History of Present Illness:  This is 77 year old male with a history of essential hypertension, on hydralazine and Cozaar, stage IV CKD but has not been on dialysis in the past, has chronic diastolic and systolic heart failure with cor pulmonale, moderate tricuspid regurgitation and aortic valve regurgitation, history of a ascending aortic aneurysm, coronary artery disease with atherosclerosis, COPD and chronic hypoxemia baseline 2 L/min supplemental oxygen at home with recurrent episodes of hypoxemia and remote acute exacerbation of COPD with hypoxemia and hypercapnia 2 months ago history of ruptured left quadricep tendon 2 years ago, chronic thrombocytopenia and erythrocytosis recurrent lower extremity cellulitis, recurrent hyperkalemia, morbid obesity history of anasarca dyslipidemia, recent admission for acute CHF exacerbation requiring Lasix drip and BiPAP support, who was brought in by EMS due to worsening altered mental status and circulatory shock. I was able to meet with wife at bedside during my evaluation who shares patient has been sleeping majority of each day over the last week has been on arousable and received phone call from medical provider regarding worsening kidney function which prompted ER evaluation. He had chest x-ray performed  while in the ER showing bilateral pleural effusions with vascular congestion and atelectasis/infiltrate. Patient in circulatory shock with Levophed support on admission in ER. PCCM consultation for admission to medical intensive care unit with circulatory shock consistent with acute on chronic renal failure with CHF exacerbation.   Please see "significant hospital events" section below for full detailed hospital course.  Pertinent  Medical History   Past Medical History:  Diagnosis Date   CHF (congestive heart failure) (HCC)    EF 45% in 2021   Chronic kidney disease    COPD (chronic obstructive pulmonary disease) ([place]) 02/16/2020   Hyperlipidemia    Hypertension    Pneumonia 2021    Micro Data:  2/14: Tracheal aspirate>> normal respiratory [place]ora 2/14: Blood culture x2>> negative 2/14: MRSA PCR>>negative 2/14: Strep pneumo & Legionella urinary antigens>> negative  Antimicrobials:   Anti-infectives (From admission, onward)    Start     Dose/Rate Route Frequency Ordered Stop   02/19/23 0645  [MAR Hold]  cefTRIAXone (ROCEPHIN) 1 g in sodium chloride 0.9 % 100 mL IVPB        (MAR Hold since Wed 02/19/2023 at 1507.Hold Reason: Transfer to a Procedural area)   1 g 200 mL/hr over 30 Minutes Intravenous Every 24 hours 02/19/23 0556 02/26/23 0644   02/11/23 0000  ceFAZolin (ANCEF) IVPB 1 g/50 mL premix       Note to Pharmacy: Send with pt to OR   1 g 100 mL/hr over 30 Minutes Intravenous On call 02/10/23 0908 02/10/23 0904   01/31/23 1700  ceFEPIme (MAXIPIME) 2 g in sodium chloride 0.9 % 100 mL IVPB  Status:  Discontinued        2 g 200 mL/hr over 30 Minutes Intravenous Every 12 hours 01/31/23 1555  02/03/23 1048   01/30/23 1700  vancomycin (VANCOREADY) IVPB 1250 mg/250 mL  Status:  Discontinued        1,250 mg 166.7 mL/hr over 90 Minutes Intravenous Every 24 hours 01/29/23 1543 01/30/23 1036   01/30/23 1300  Ampicillin-Sulbactam (UNASYN) 3 g in sodium chloride 0.9 % 100 mL IVPB  Status:   Discontinued        3 g 200 mL/hr over 30 Minutes Intravenous Every 8 hours 01/30/23 1036 01/31/23 1118   01/30/23 0000  vancomycin variable dose per unstable renal function (pharmacist dosing)  Status:  Discontinued         Does not apply See admin instructions 01/29/23 1125 01/29/23 1548   01/29/23 2200  piperacillin-tazobactam (ZOSYN) IVPB 3.375 g  Status:  Discontinued        3.375 g 12.5 mL/hr over 240 Minutes Intravenous Every 6 hours 01/29/23 1547 01/29/23 1548   01/29/23 2200  piperacillin-tazobactam (ZOSYN) IVPB 3.375 g  Status:  Discontinued        3.375 g 100 mL/hr over 30 Minutes Intravenous Every 6 hours 01/29/23 1548 01/30/23 1036   01/29/23 2100  piperacillin-tazobactam (ZOSYN) IVPB 2.25 g  Status:  Discontinued        2.25 g 100 mL/hr over 30 Minutes Intravenous Every 8 hours 01/29/23 1125 01/29/23 1547   01/29/23 1115  piperacillin-tazobactam (ZOSYN) IVPB 3.375 g        3.375 g 100 mL/hr over 30 Minutes Intravenous  Once 01/29/23 1103 01/29/23 1457   01/29/23 1115  vancomycin (VANCOREADY) IVPB 2000 mg/400 mL        2,000 mg 200 mL/hr over 120 Minutes Intravenous  Once 01/29/23 1104 01/29/23 1656        Significant Hospital Events: Including procedures, antibiotic start and stop dates in addition to other pertinent events   01/30/23- I met with girlfriend of patient today to review short term medical plan.  He had Echo whith RV dysfunction and concern for PE.  We have started CRRT yesterday with additional interval improvement. He will have CTPE today.  He is weaned down on pressors today on vasor 0.4 and 4 of levophed reduced from 36 this morning.  01/31/23- remains agitated intermittently overnight. Continued issues with clot burden through CRRT circuit. 02/01/23- remains agitated overnight. Requiring numerous ativan pushes. Attempting to wean down sedation. Failed SBT miserably. 02/02/23- issues with CRRT overnight. Will attempt higher UF today. Assess for possible chest  tube placement.  02/03/23- No issues with CRRT overnight, currently running without complication.  CT Head obtained overnight and negative for acute process.  Off Precedex this morning, currently somnolent, mental status currently precluding extubation. Will perform SBT as mental status permits.  Stop Cefepime, checking ammonia. 02/04/23- Remains on CRRT. Pt more awake and tracking.  Tolerated SBT for 12 hrs  02/05/23- Pt remains mechanically intubated on minimal vent settings.  Pt able to track and open eyes on command, however unable to move BLE and BLE  02/06/23-Off vasopressors.  Palliative Care consulted pt stating he DOES NOT want any form of dialysis.  If pt remains stable will transfer service to [facility]  2/26 PLaced PERM CATH for HD 2/27-status post hemodialysis today.  Doing reasonably well.  Mental status improved.  Is pretty clear with [place] that he does not want hemodialysis.  2/28 -patient has gone back and forth about wanting dialysis and not wanting dialysis.  He told the palliative team this morning that he did not want any further dialysis with him:  Back to room states that he wants everything done now.  Nephrology is going to hold dialysis today and tomorrow and evaluate for Friday to see if he needs it given that he had dialysis in the last 2 days.  The dialysis catheter was placed 2 days ago is working well and patient is healing recovering as expected per the vascular team and they are recommending outpatient follow-up. 2/29: Dialysis held today and yesterday.  Monitoring for renal recovery.  Creatinine started to rise.  Nephrology starting Lasix 80 mg IV.  3/4 removal or PERM CATH He made decision for no dialysis and as such dialysis catheter was removed  3/6 hypotension and another large hematochezia episode 3/6 EGD with bleeding ulcers, injections and clips placed POST OP RESP FAILURE AND SEVERE SHOCK ,TRANSFERRED TO ICU FOR HYPOVOLUMIC SHOCK 3/6 TRH discussion with patient-He reiterates  NO CPR and if there is no reasonable chance of his coming off life support he would like to be made comfortable and pass peacefully      Interim History / Subjective:  Patient with severe shock Intubated Multiorgan failure Placed on pressors    Objective   Blood pressure (!) 71/55, pulse 88, temperature (!) 96.3 F (35.7 C), temperature source Temporal, resp. rate 20, height '5\' 11"'$  (1.803 m), weight 79.5 kg, SpO2 100 %.    FiO2 (%):  [2 %] 2 %   Intake/Output Summary (Last 24 hours) at 02/19/2023 1636 Last data filed at 02/19/2023 1315 Gross per 24 hour  Intake 374.67 ml  Output 600 ml  Net -225.33 ml    Filed Weights   02/18/23 0700 02/19/23 0350 02/19/23 0802  Weight: 76.4 kg 75.5 kg 79.5 kg     REVIEW OF SYSTEMS  PATIENT IS UNABLE TO PROVIDE COMPLETE REVIEW OF SYSTEMS DUE TO SEVERE CRITICAL ILLNESS   PHYSICAL EXAMINATION:  GENERAL:critically ill appearing, +resp distress EYES: Pupils equal, round, reactive to light.  No scleral icterus.  MOUTH: Moist mucosal membrane. INTUBATED NECK: Supple.  PULMONARY: Lungs clear to auscultation, +rhonchi, +wheezing CARDIOVASCULAR: S1 and S2.  Regular rate and rhythm GASTROINTESTINAL: Soft, nontender, -distended. Positive bowel sounds.  MUSCULOSKELETAL: No swelling, clubbing, or edema.  NEUROLOGIC: obtunded SKIN:normal, warm to touch, Capillary refill delayed  Pulses present bilaterally   Assessment & Plan:  Circulatory shock  massive GIB s/p EGD in setting of LIVER CIRRHOSIS Acute decompensated HFpEF and right-sided heart failure    Acute Hypoxic Respiratory Failure needing vent support Acute kidney injury on chronic kidney disease stage IV with  Pulmonary Hypertension, & small bilateral Pleural Effusions  Severe ACUTE Hypoxic and Hypercapnic Respiratory Failure -continue Mechanical Ventilator support -Wean Fio2 and PEEP as tolerated -VAP/VENT bundle implementation - Wean PEEP & FiO2 as tolerated, maintain SpO2 > 88% -  Head of bed elevated 30 degrees, VAP protocol in place - Plateau pressures less than 30 cm H20  - Intermittent chest x-ray & ABG PRN - Ensure adequate pulmonary hygiene      Acute Decompensated HFpEF & Right sided HF Hypotension likely now secondary to sedating medication/GIB Moderate Tricuspid & Aortic Regurgitation Elevated Troponin in setting of  demand ischemia Echocardiogram 01/30/23: LVEF 50055%, Grade I DD, severely reduced RV systolic function, mild to moderate TR & AR - Vasopressors as needed to maintain MAP >65   ACUTE KIDNEY INJURY/Renal Failure -continue Foley Catheter-assess need -Avoid nephrotoxic agents -Follow urine output, BMP -Ensure adequate renal perfusion, optimize oxygenation -Renal dose medications   Intake/Output Summary (Last 24 hours) at 02/19/2023 1650 Last  data filed at 02/19/2023 1315 Gross per 24 hour  Intake 374.67 ml  Output 600 ml  Net -225.33 ml    Thrombocytopenia, suspect due to Uremia superimposed on chronic thrombocytopenia HIT~RULED OUT - Trend CBC  - Monitor for s/sx of bleeding  - Transfuse for hgb <7    NEUROLOGY ACUTE METABOLIC ENCEPHALOPATHY  h/o Acute metabolic encephalopathy vs possible hepatic encephalopathy     Diet/type: tubefeeds and NPO DVT prophylaxis: SCD GI prophylaxis: PPI Foley:  Yes, and it is still needed Code Status: DNR Last date of multidisciplinary goals of care discussion [02/05/23]  CBC: Recent Labs  Lab 02/15/23 0609 02/16/23 0403 02/17/23 0542 02/18/23 0730 02/19/23 0602 02/19/23 0840  WBC 8.6 9.2 7.7 6.1 9.1  --   NEUTROABS 7.3 7.6 6.2 4.9 7.2  --   HGB 11.2* 11.6* 11.8* 11.9* 9.3* 8.3*  HCT 35.8* 37.1* 38.1* 37.5* 30.0* 25.7*  MCV 103.5* 103.3* 103.3* 101.9* 103.1*  --   PLT 175 186 197 184 192  --      Basic Metabolic Panel: Recent Labs  Lab 02/15/23 0609 02/16/23 0400 02/16/23 0403 02/17/23 0542 02/17/23 0543 02/18/23 0730 02/18/23 0731 02/19/23 0602 02/19/23 0604  NA 137  136 137  --  139  --  135 139  --   K 3.6 3.7 3.7  --  3.7  --  3.9 5.1  --   CL 101 98 99  --  101  --  101 102  --   CO2 '28 30 29  '$ --  31  --  31 32  --   GLUCOSE 110* 110* 113*  --  105*  --  97 117*  --   BUN 58* 56* 60*  --  51*  --  51* 70*  --   CREATININE 2.25* 2.49* 2.45*  --  2.30*  --  2.11* 2.35*  --   CALCIUM 9.1 9.0 9.1  --  9.1  --  9.1 8.7*  --   MG 2.2  --  2.3 2.2  --  2.1  --  2.1  --   PHOS 3.3  --  3.2  --  2.9  --  3.3  --  2.4*    GFR: Estimated Creatinine Clearance: 28 mL/min (A) (by C-G formula based on SCr of 2.35 mg/dL (H)). Recent Labs  Lab 02/16/23 0403 02/17/23 0542 02/18/23 0730 02/19/23 0602  WBC 9.2 7.7 6.1 9.1     Liver Function Tests: Recent Labs  Lab 02/13/23 0433 02/14/23 0528 02/15/23 0609 02/16/23 0403 02/17/23 0543 02/18/23 0731 02/19/23 0602  AST 26  --   --   --   --   --  32  ALT 21  --   --   --   --   --  24  ALKPHOS 108  --   --   --   --   --  97  BILITOT 1.5*  --   --   --   --   --  0.8  PROT 5.8*  --   --   --   --   --  5.1*  ALBUMIN 2.4*   < > 2.4* 2.5* 2.5* 2.5* 2.2*   < > = values in this interval not displayed.    No results for input(s): "LIPASE", "AMYLASE" in the last 168 hours. No results for input(s): "AMMONIA" in the last 168 hours.   ABG    Component Value Date/Time   PHART 7.35 02/03/2023 1047  PCO2ART 49 (H) 02/03/2023 1047   PO2ART 91 02/03/2023 1047   HCO3 27.1 02/03/2023 1047   TCO2 31 02/15/2020 1034   O2SAT 98.9 02/03/2023 1047     Coagulation Profile: Recent Labs  Lab 02/19/23 0602  INR 1.2     Cardiac Enzymes: No results for input(s): "CKTOTAL", "CKMB", "CKMBINDEX", "TROPONINI" in the last 168 hours.  HbA1C: Hgb A1c MFr Bld  Date/Time Value Ref Range Status  10/02/2022 11:16 PM 6.3 (H) 4.8 - 5.6 % Final    Comment:    (NOTE) Pre diabetes:          5.7%-6.4%  Diabetes:              >6.4%  Glycemic control for   <7.0% adults with diabetes     CBG: Recent Labs  Lab  02/19/23 0952  GLUCAP 76     PROGNOSIS IS POOR HIGH CHANCE OF CARDIAC ARREST   Critical Care Time devoted to patient care services described in this note is 75 minutes.  Critical care was necessary to treat /prevent imminent and life-threatening deterioration. Overall, patient is critically ill, prognosis is guarded.  Patient with Multiorgan failure and at high risk for cardiac arrest and death.    Corrin Parker, M.D.  [facility]  Medical Director [place] Director [facility] Cardio-Pulmonary Department

## 2023-02-19 NOTE — Plan of Care (Signed)
  Patient woke up on the vent, followed commands Patient was able to communicate to remove tube from his mouth Patient was assessed for extubation SAT/SBT with PS mode attempted and patient was able to take big deep breaths on command  Patient was successfully extubated and placed on oxygen support  PROGNOSIS IS VERY POOR HIGH RISK FOR CARDIAC ARREST AND DEATH

## 2023-02-19 NOTE — Progress Notes (Addendum)
Daily Progress Note   Patient Name: John [name]       Date: 02/19/2023 DOB: Apr 19, 1946  Age: 77 y.o. MRN#: [id number] Attending Physician: Emeterio Reeve, DO Primary Care Physician: Alisa Graff, FNP Admit Date: 01/29/2023  Reason for Consultation/Follow-up: Establishing goals of care  HPI/Brief Hospital Review: 77 year old male with a history of HTN, stage IV CKD-has not been on dialysis prior to admission, chronic diastolic and systolic heart failure with cor pulmonale, moderate tricuspid regurgitation and aortic valve regurgitation, history of ascending aortic aneurysm, CAD with atherosclerosis, COPD and chronic hypoxemia baseline 2 L/min supplemental oxygen at home, chronic thrombocytopenia and erythrocytosis, recurrent lower extremity cellulitis, recurrent hyperkalemia with a recent admission for acute CHF exacerbation requiring Lasix drip and BiPAP support, who was brought in by EMS on 01/29/2023 due to worsening altered mental status and circulatory shock.     Required intubation, CRRT and pressor support this admission. Successfully extubated 2/21.   Review of chart: Perm-cath removed 3/4 Improved renal function-no indication for further dialysis at this time Awaiting disposition for discharge  3/6 Noted acute GI bleed Blood transfusions Hypotensive Need for possible colonoscopy and CTA to assess bleeding source   Palliative Medicine consulted for assisting with goals of care conversations.   Subjective: Extensive chart review has been completed prior to meeting patient including labs, vital signs, imaging, progress notes, orders, and available advanced directive documents from current and previous encounters.    Visited with John [name] at his bedside. Awake and  alert, lying in bed. Able to share his understanding of bleeding from his stomach-aware he may need colonoscopy and CT imaging. Attempted to explore ongoing goals of care conversations but John [name] closed his eyes and did not express willingness to participate in conversations.  He asks for his team to not call and update his girlfriend-Nancy but shares he is fine with her continuing to visit. Shared his wishes with primary team.  PMT to continue to follow for ongoing needs and support  Objective:  Physical Exam Constitutional:      General: He is not in acute distress.    Appearance: He is ill-appearing.  Pulmonary:     Effort: Pulmonary effort is normal. No respiratory distress.  Skin:    General: Skin is warm and dry.  Neurological:     Mental Status:  He is alert.     Motor: Weakness present.             Vital Signs: BP (!) 82/54   Pulse 83   Temp 97.6 F (36.4 C)   Resp 18   Ht '5\' 11"'$  (1.803 m)   Wt 79.5 kg   SpO2 95%   BMI 24.44 kg/m  SpO2: SpO2: 95 % O2 Device: O2 Device: Nasal Cannula O2 Flow Rate: O2 Flow Rate (L/min): 2 L/min   Palliative Care Assessment & Plan   Assessment/Recommendation/Plan  DNR Agrees to colonoscopy and CTA if recommended Ongoing GOC conversations needed as permitted by John [name] PMT to continue to follow for ongoing needs and support  Thank you for allowing the Palliative Medicine Team to assist in the care of this patient.  Total time:  25 minutes  Time spent includes: Detailed review of medical records (labs, imaging, vital signs), medically appropriate exam (mental status, respiratory, cardiac, skin), discussed with treatment team, counseling and educating patient, family and staff, documenting clinical information, medication management and coordination of care.  Theodoro Grist, DNP, AGNP-C Palliative Medicine   Please contact Palliative Medicine Team phone at [phone] for questions and concerns.

## 2023-02-19 NOTE — Transfer of Care (Addendum)
Immediate Anesthesia Transfer of Care Note  Patient: John [name]  Procedure(s) Performed: ESOPHAGOGASTRODUODENOSCOPY (EGD) WITH PROPOFOL  Patient Location: ICU 11  Anesthesia Type:General  Level of Consciousness: sedated and Patient remains intubated per anesthesia plan  Airway & Oxygen Therapy: Patient remains intubated per anesthesia plan and Patient placed on Ventilator (see vital sign flow sheet for setting)  Post-op Assessment: Report given to RN and Post -op Vital signs reviewed and stable  Post vital signs: Reviewed and stable  Last Vitals:  Vitals Value Taken Time  BP 127/86 1640  Temp    Pulse 103   Resp 21   SpO2 92     Last Pain:  Vitals:   02/19/23 1500  TempSrc: Temporal  PainSc:          Complications: No notable events documented.

## 2023-02-19 NOTE — Progress Notes (Addendum)
PROGRESS NOTE    John [name]   [id number] DOB: 1946-10-28  DOA: 01/29/2023 Date of Service: 02/19/23 PCP: Alisa Graff, FNP     Brief Narrative / Hospital Course:  77 year old male with a history of essential hypertension, on hydralazine and Cozaar, stage IV CKD but has not been on dialysis in the past, has chronic diastolic and systolic heart failure with cor pulmonale, moderate tricuspid regurgitation and aortic valve regurgitation, history of ascending aortic aneurysm, coronary artery disease with atherosclerosis, COPD and chronic hypoxemia baseline 2 L/min supplemental oxygen at home with recurrent episodes of hypoxemia and remote acute exacerbation of COPD with hypoxemia and hypercapnia 2 months ago history of ruptured left quadricep tendon 2 years ago, chronic thrombocytopenia and erythrocytosis recurrent lower extremity cellulitis, recurrent hyperkalemia, morbid obesity history of anasarca dyslipidemia, recent admission for acute CHF exacerbation requiring Lasix drip and BiPAP support, who was brought in by EMS due to worsening altered mental status and circulatory shock.    Patient received CRRT.  He also required intubation on account of worsening respiratory function.  Currently extubated.  He made decision for no dialysis and as such dialysis catheter was removed. Patient had discussion with the girlfriend and later on requested to be on dialysis.  Dialysis catheter was then replaced and dialysis was resumed.  Later nephrologist recommended removal of dialysis catheter given renal recovery at this time.    Patient was medically stable pending placement in a skilled nursing facility when he developed profound GI bleeding early morning 02/19/23, GO consulted, CTA done despite renal function given emergent nature, (+)duodenal bleed, taken for emergent EGD. See IPAL note - pt has also asked [place] NOT to inform his girlfriend [place]. Fluid resuscitation and PRBC pending EGD, known risk  w/ CHF and previous dialysis     Consultants:  Gastroenterology - Dr [facility]  Cardiology Nephrology Vascular Surgery   Procedures: Emergent EGD 02/19/23 for duodenal bleed 01/29/23 Insertion of Non-tunneled Central Venous Catheter with US guidance        ASSESSMENT & PLAN:   Principal Problem:   Shock circulatory ([place]) Active Problems:   Acute on chronic congestive heart failure ([place])   Acute hypoxic respiratory failure (HCC)   Toxic metabolic encephalopathy  Hypovolemic shock d/t ABLA - GI bleed GI bleed w/ dark bloody stool 02/19/23, see notes from earlier re: rapid response and IPAL  EGD S/p 2 units emergent PRBC and trending HH  Protonix gtt No anticoags . Fluid resuscitation and PRBC pending EGD, known risk w/ CHF and previous dialysis - monitor fluid status closely   Goals of Care Intubation for medical intervention w/ anesthesia does not violate a DNR and patient assents He appears to understand his situation and his options though lethargy precludes him communicating in any great detail - I judge his capacity to be limited but not absent.   He reiterates we do not contact [name] He agrees that his attending physician may make decisions - he would like not to be kept on long term life support machines, he does not want CPR or defibrillation Palliative care following  STRONGLY ENCOURAGE completion of MOST form and advanced directive asap   Cardiogenic shock POA Acute decompensated HFpEF and right-sided heart failure POA - improved  RV dysfunction and dilation on echocardiography, concerning for RV failure and pulmonary hypertension. Porto-pulmonary hypertension on the differential given history of liver cirrhosis. Was on CVVH for ultra-filtration for volume removal Cardiology has signed off.   No further diagnostics  from their standpoint. Nephrology is on board we appreciate input 02/19/23 w/ GI bleed and hypovolemic shock he underwent  aggressive fluid resuscitation and PRBC pending EGD, known risk w/ CHF and previous dialysis - monitor fluid status closely    Acute Hypoxic Respiratory Failure d/t HFpEF Patient extubated successfully to nasal cannula on 2/22.  monitor oxygen and continue to wean as tolerated   Toxic Metabolic Encephalopathy - improved/resolved Mental status approaching baseline.  Mental status and in the setting of renal failure, psychotropic medications.  Waxing and waning   Acute kidney injury on chronic kidney disease stage IV Was on CRRT given worsening renal failure with hypotension-now resolved Dialysis catheter has been removed Patient receiving intermittent Lasix as recommended by nephrologist.  Last dose 02/13/2023   Hepatic cirrhosis Thrombocytopenia Does confer a poor prognosis.   Was on cefepime empirically given leukocytosis.  Cultures remain negative.  Finished 5 days of antibiotics. Was d/c but now On Ceftriaxone SBP Ppx    DVT prophylaxis: SCD Pertinent IV fluids/nutrition: IV boluses, npo for now Central lines / invasive devices: none  Code Status: DNR  Current Admission Status: inpatient  TOC needs / Dispo plan: SNF Barriers to discharge / significant pending items: medically unstable GI bleed              Subjective / Brief ROS:  Patient reports no pain, he is very tired    Family Communication: none per patietn request     Objective Findings:  Vitals:   02/19/23 1431 02/19/23 1436 02/19/23 1500 02/19/23 1647  BP: (!) 61/46 (!) 65/46 (!) 71/55 127/83  Pulse: 95 93 88 (!) 103  Resp:   20 (!) 21  Temp: (!) 97.3 F (36.3 C)  (!) 96.3 F (35.7 C)   TempSrc:   Temporal   SpO2: 100%  100% 92%  Weight:      Height:        Intake/Output Summary (Last 24 hours) at 02/19/2023 1705 Last data filed at 02/19/2023 1315 Gross per 24 hour  Intake 374.67 ml  Output 600 ml  Net -225.33 ml   Filed Weights   02/18/23 0700 02/19/23 0350 02/19/23 0802  Weight:  76.4 kg 75.5 kg 79.5 kg    Examination:  Physical Exam Constitutional:      General: He is in acute distress.     Appearance: He is ill-appearing.  Cardiovascular:     Rate and Rhythm: Regular rhythm. Tachycardia present.  Pulmonary:     Effort: Pulmonary effort is normal.     Breath sounds: Normal breath sounds.  Abdominal:     Palpations: Abdomen is soft.  Musculoskeletal:     Right lower leg: No edema.     Left lower leg: No edema.  Skin:    General: Skin is dry.     Coloration: Skin is pale.  Neurological:     General: No focal deficit present.     Mental Status: He is alert. Mental status is at baseline.          Scheduled Medications:   [MAR Hold] (feeding supplement) PROSource Plus  30 mL Oral TID BM   sodium chloride   Intravenous Once   [MAR Hold] ascorbic acid  500 mg Oral BID   [MAR Hold] Chlorhexidine Gluconate Cloth  6 each Topical Q0600   [MAR Hold] docusate sodium  100 mg Oral BID   EPINEPHrine       [MAR Hold] folic acid  1 mg Oral Daily  ipratropium-albuterol       [MAR Hold] metoprolol tartrate  12.5 mg Oral BID   [MAR Hold] multivitamin  1 tablet Oral QHS   [MAR Hold] thiamine  100 mg Oral Daily    Continuous Infusions:  sodium chloride 10 mL/hr at 02/19/23 1503   sodium chloride Stopped (02/19/23 1654)   sodium chloride 250 mL (02/19/23 1654)   [MAR Hold] cefTRIAXone (ROCEPHIN)  IV 1 g (02/19/23 [id number])   norepinephrine (LEVOPHED) Adult infusion     pantoprazole 8 mg/hr (02/19/23 0712)   vasopressin      PRN Medications:  [MAR Hold] diclofenac Sodium, [MAR Hold] docusate sodium, EPINEPHrine, ipratropium-albuterol, [MAR Hold] mouth rinse, [MAR Hold] oxyCODONE, [MAR Hold] polyethylene glycol  Antimicrobials from admission:  Anti-infectives (From admission, onward)    Start     Dose/Rate Route Frequency Ordered Stop   02/19/23 0645  [MAR Hold]  cefTRIAXone (ROCEPHIN) 1 g in sodium chloride 0.9 % 100 mL IVPB        (MAR Hold since Wed  02/19/2023 at 1507.Hold Reason: Transfer to a Procedural area)   1 g 200 mL/hr over 30 Minutes Intravenous Every 24 hours 02/19/23 0556 02/26/23 0644   02/11/23 0000  ceFAZolin (ANCEF) IVPB 1 g/50 mL premix       Note to Pharmacy: Send with pt to OR   1 g 100 mL/hr over 30 Minutes Intravenous On call 02/10/23 0908 02/10/23 0904   01/31/23 1700  ceFEPIme (MAXIPIME) 2 g in sodium chloride 0.9 % 100 mL IVPB  Status:  Discontinued        2 g 200 mL/hr over 30 Minutes Intravenous Every 12 hours 01/31/23 1555 02/03/23 1048   01/30/23 1700  vancomycin (VANCOREADY) IVPB 1250 mg/250 mL  Status:  Discontinued        1,250 mg 166.7 mL/hr over 90 Minutes Intravenous Every 24 hours 01/29/23 1543 01/30/23 1036   01/30/23 1300  Ampicillin-Sulbactam (UNASYN) 3 g in sodium chloride 0.9 % 100 mL IVPB  Status:  Discontinued        3 g 200 mL/hr over 30 Minutes Intravenous Every 8 hours 01/30/23 1036 01/31/23 1118   01/30/23 0000  vancomycin variable dose per unstable renal function (pharmacist dosing)  Status:  Discontinued         Does not apply See admin instructions 01/29/23 1125 01/29/23 1548   01/29/23 2200  piperacillin-tazobactam (ZOSYN) IVPB 3.375 g  Status:  Discontinued        3.375 g 12.5 mL/hr over 240 Minutes Intravenous Every 6 hours 01/29/23 1547 01/29/23 1548   01/29/23 2200  piperacillin-tazobactam (ZOSYN) IVPB 3.375 g  Status:  Discontinued        3.375 g 100 mL/hr over 30 Minutes Intravenous Every 6 hours 01/29/23 1548 01/30/23 1036   01/29/23 2100  piperacillin-tazobactam (ZOSYN) IVPB 2.25 g  Status:  Discontinued        2.25 g 100 mL/hr over 30 Minutes Intravenous Every 8 hours 01/29/23 1125 01/29/23 1547   01/29/23 1115  piperacillin-tazobactam (ZOSYN) IVPB 3.375 g        3.375 g 100 mL/hr over 30 Minutes Intravenous  Once 01/29/23 1103 01/29/23 1457   01/29/23 1115  vancomycin (VANCOREADY) IVPB 2000 mg/400 mL        2,000 mg 200 mL/hr over 120 Minutes Intravenous  Once 01/29/23 1104  01/29/23 1656           Data Reviewed:  I have personally reviewed the following...  CBC: Recent Labs  Lab 02/15/23 0609 02/16/23 0403 02/17/23 0542 02/18/23 0730 02/19/23 0602 02/19/23 0840  WBC 8.6 9.2 7.7 6.1 9.1  --   NEUTROABS 7.3 7.6 6.2 4.9 7.2  --   HGB 11.2* 11.6* 11.8* 11.9* 9.3* 8.3*  HCT 35.8* 37.1* 38.1* 37.5* 30.0* 25.7*  MCV 103.5* 103.3* 103.3* 101.9* 103.1*  --   PLT 175 186 197 184 192  --    Basic Metabolic Panel: Recent Labs  Lab 02/15/23 0609 02/16/23 0400 02/16/23 0403 02/17/23 0542 02/17/23 0543 02/18/23 0730 02/18/23 0731 02/19/23 0602 02/19/23 0604  NA 137 136 137  --  139  --  135 139  --   K 3.6 3.7 3.7  --  3.7  --  3.9 5.1  --   CL 101 98 99  --  101  --  101 102  --   CO2 '28 30 29  '$ --  31  --  31 32  --   GLUCOSE 110* 110* 113*  --  105*  --  97 117*  --   BUN 58* 56* 60*  --  51*  --  51* 70*  --   CREATININE 2.25* 2.49* 2.45*  --  2.30*  --  2.11* 2.35*  --   CALCIUM 9.1 9.0 9.1  --  9.1  --  9.1 8.7*  --   MG 2.2  --  2.3 2.2  --  2.1  --  2.1  --   PHOS 3.3  --  3.2  --  2.9  --  3.3  --  2.4*   GFR: Estimated Creatinine Clearance: 28 mL/min (A) (by C-G formula based on SCr of 2.35 mg/dL (H)). Liver Function Tests: Recent Labs  Lab 02/13/23 0433 02/14/23 0528 02/15/23 0609 02/16/23 0403 02/17/23 0543 02/18/23 0731 02/19/23 0602  AST 26  --   --   --   --   --  32  ALT 21  --   --   --   --   --  24  ALKPHOS 108  --   --   --   --   --  97  BILITOT 1.5*  --   --   --   --   --  0.8  PROT 5.8*  --   --   --   --   --  5.1*  ALBUMIN 2.4*   < > 2.4* 2.5* 2.5* 2.5* 2.2*   < > = values in this interval not displayed.   No results for input(s): "LIPASE", "AMYLASE" in the last 168 hours. No results for input(s): "AMMONIA" in the last 168 hours. Coagulation Profile: Recent Labs  Lab 02/19/23 0602  INR 1.2   Cardiac Enzymes: No results for input(s): "CKTOTAL", "CKMB", "CKMBINDEX", "TROPONINI" in the last 168  hours. BNP (last 3 results) No results for input(s): "PROBNP" in the last 8760 hours. HbA1C: No results for input(s): "HGBA1C" in the last 72 hours. CBG: Recent Labs  Lab 02/19/23 0952  GLUCAP 76   Lipid Profile: No results for input(s): "CHOL", "HDL", "LDLCALC", "TRIG", "CHOLHDL", "LDLDIRECT" in the last 72 hours. Thyroid Function Tests: No results for input(s): "TSH", "T4TOTAL", "FREET4", "T3FREE", "THYROIDAB" in the last 72 hours. Anemia Panel: No results for input(s): "VITAMINB12", "FOLATE", "FERRITIN", "TIBC", "IRON", "RETICCTPCT" in the last 72 hours. Most Recent Urinalysis On File:     Component Value Date/Time   COLORURINE YELLOW (A) 01/29/2023 2227   APPEARANCEUR HAZY (A) 01/29/2023 2227   LABSPEC 1.014 01/29/2023 2227  PHURINE 5.0 01/29/2023 2227   GLUCOSEU NEGATIVE 01/29/2023 2227   HGBUR MODERATE (A) 01/29/2023 2227   BILIRUBINUR NEGATIVE 01/29/2023 2227   KETONESUR 5 (A) 01/29/2023 2227   PROTEINUR NEGATIVE 01/29/2023 2227   NITRITE NEGATIVE 01/29/2023 2227   LEUKOCYTESUR TRACE (A) 01/29/2023 2227   Sepsis Labs: '@LABRCNTIP'$ (procalcitonin:4,lacticidven:4) Microbiology: No results found for this or any previous visit (from the past 240 hour(s)).    Radiology Studies last 3 days: CT ANGIO GI BLEED  Result Date: 02/19/2023 CLINICAL DATA:  Bloody bowel movement. Concern for active gastrointestinal bleeding. EXAM: CTA ABDOMEN AND PELVIS WITHOUT AND WITH CONTRAST TECHNIQUE: Multidetector CT imaging of the abdomen and pelvis was performed using the standard protocol during bolus administration of intravenous contrast. Multiplanar reconstructed images and MIPs were obtained and reviewed to evaluate the vascular anatomy. RADIATION DOSE REDUCTION: This exam was performed according to the departmental dose-optimization program which includes automated exposure control, adjustment of the mA and/or kV according to patient size and/or use of iterative reconstruction technique.  CONTRAST:  24m OMNIPAQUE IOHEXOL 350 MG/ML SOLN COMPARISON:  None Available. FINDINGS: VASCULAR Aorta: Atherosclerotic calcification of the aorta. Celiac: Patent without evidence of aneurysm, dissection, vasculitis or significant stenosis. SMA: Patent without evidence of aneurysm, dissection, vasculitis or significant stenosis. Renals: Both renal arteries are patent without evidence of aneurysm, dissection, vasculitis, fibromuscular dysplasia or significant stenosis. IMA: Patent without evidence of aneurysm, dissection, vasculitis or significant stenosis. Inflow: Patent without evidence of aneurysm, dissection, vasculitis or significant stenosis. Proximal Outflow: Aortic calcification. Atherosclerotic calcification. No aneurysm. Veins: No obvious venous abnormality within the limitations of this arterial phase study. Review of the MIP images confirms the above findings. NON-VASCULAR Lower chest: Lung bases are clear. Hepatobiliary: No focal hepatic lesion. Normal gallbladder. No biliary duct dilatation. Common bile duct is normal. Pancreas: Pancreas is normal. No ductal dilatation. No pancreatic inflammation. Spleen: Normal spleen Adrenals/urinary tract: Adrenal glands and kidneys are normal. The ureters and bladder normal. Stomach/Bowel: Stomach is normal. On delayed portal venous phase imaging there is a collection of endoluminal contrast within the second portion the duodenum (image 32/11). Findings consistent with active CT chest of bleeding. On arterial phase imaging there is a tiny focus of intraluminal contrast at this site (image 67/5). No evidence of active gastrointestinal bleeding within the small bowel. No small bowel inflammation or dilatation. There is high-density material within the colon. No evidence of active extravasation IV contrast into the lumen of the LEFT or RIGHT colon. Rectosigmoid colon likewise unremarkable except for several diverticula. Rectum unremarkable. Vascular/Lymphatic:  Abdominal aorta is normal caliber with atherosclerotic calcification. There is no retroperitoneal or periportal lymphadenopathy. No pelvic lymphadenopathy. Reproductive: Prostate unremarkable Other: Mild sarcoma of the soft tissues. Musculoskeletal: Degenerative osteophytosis of the spine. IMPRESSION: VASCULAR 1. Active gastrointestinal bleeding in the second portion duodenum evident on the portal venous phase imaging. 2. Atherosclerotic calcification of the aorta is branches without high-grade stenosis. NON-VASCULAR 1. No evidence of bowel obstruction or inflammation. These results will be called to the ordering clinician or representative by the Radiologist Assistant, and communication documented in the PACS or [employer] Electronically Signed   By: SSuzy BouchardM.D.   On: 02/19/2023 13:59   PERIPHERAL VASCULAR CATHETERIZATION  Result Date: 02/17/2023 See surgical note for result.            LOS: 21 days    Time spent: 50 min    NEmeterio Reeve DO [facility] 02/19/2023, 5:05 PM    Dictation software may have been  used to generate the above note. Typos may occur and escape review in typed/dictated notes. Please contact Dr Sheppard Coil directly for clarity if needed.  Staff may message me via secure chat in [place]  but this may not receive an immediate response,  please page me for urgent matters!  If 7PM-7AM, please contact night coverage [email or web address]

## 2023-02-19 NOTE — Hospital Course (Addendum)
77 year old male with a history of essential hypertension, on hydralazine and Cozaar, stage IV CKD but has not been on dialysis in the past, has chronic diastolic and systolic heart failure with cor pulmonale, moderate tricuspid regurgitation and aortic valve regurgitation, history of ascending aortic aneurysm, coronary artery disease with atherosclerosis, COPD and chronic hypoxemia baseline 2 L/min supplemental oxygen at home with recurrent episodes of hypoxemia and remote acute exacerbation of COPD with hypoxemia and hypercapnia 2 months ago history of ruptured left quadricep tendon 2 years ago, chronic thrombocytopenia and erythrocytosis recurrent lower extremity cellulitis, recurrent hyperkalemia, morbid obesity history of anasarca dyslipidemia, recent admission for acute CHF exacerbation requiring Lasix drip and BiPAP support, who was brought in by EMS due to worsening altered mental status and circulatory shock.    Patient received CRRT.  He also required intubation on account of worsening respiratory function.  Currently extubated.  He made decision for no dialysis and as such dialysis catheter was removed. Patient had discussion with the girlfriend and later on requested to be on dialysis.  Dialysis catheter was then replaced and dialysis was resumed.  Later nephrologist recommended removal of dialysis catheter given renal recovery at this time.    Patient was medically stable pending placement in a skilled nursing facility when he developed profound GI bleeding early morning 02/19/23, GO consulted, CTA done despite renal function given emergent nature, (+)duodenal bleed, taken for emergent EGD. See IPAL note - pt has also asked [place] NOT to inform his girlfriend [place]. Fluid resuscitation and PRBC pending EGD, known risk w/ CHF and previous dialysis   03/07 duodenal bleed on CTA, EGD done, extubated post-procedure and on pressor support, see GOC discussion in progress note, may need dialysis,  hyperkalemic. Started on CRRT.   03/08: stable on pressors and CRRT, more alert and conversational today     Consultants:  Gastroenterology - Dr [facility]  Cardiology Nephrology Vascular Surgery   Procedures: Emergent EGD 02/19/23 for duodenal bleed 01/29/23 Insertion of Non-tunneled Central Venous Catheter with US guidance        ASSESSMENT & PLAN:   Principal Problem:   Shock circulatory ([place]) Active Problems:   Acute on chronic congestive heart failure ([place])   Acute hypoxic respiratory failure ([facility])   Toxic metabolic encephalopathy  Hypovolemic shock d/t ABLA - GI bleed Trend CBC/HH and transfuse as needed  Protonix IV bid  Pressors to titrate  No anticoags   Hyperkalemia - resolved Treating as needed and following BMP  Cardiogenic shock POA d/t HFpEF - improved but now hypovolemic shock d/t GI bleed and Acute decompensated HFpEF and right-sided heart failure RV dysfunction and dilation on echocardiography, concerning for RV failure and pulmonary hypertension. Porto-pulmonary hypertension on the differential given history of liver cirrhosis.  02/19/23 w/ GI bleed and hypovolemic shock he underwent aggressive fluid resuscitation and PRBC pending EGD, known risk w/ CHF and previous dialysis - monitor fluid status closely  CVVH for ultra-filtration for volume removal Cardiology had previously signed off, may need to reengage but no further diagnostics from their standpoint. Nephrology is on board we appreciate input   Acute Hypoxic Respiratory Failure d/t HFpEF and hypovolemic shock Continue supplemental O2 NO REINTUBATION if respiratory distress    Toxic Metabolic Encephalopathy - improved but waxes and wanes I judge his decision-making capacity to be limited but not absent.  See [place] discussions below   Acute kidney injury on chronic kidney disease stage IV CRRT per nephrology and intensivist teams   Hepatic  cirrhosis Thrombocytopenia.   Was  on cefepime empirically given leukocytosis.  Cultures remain negative. Finished 5 days of antibiotics.  Monitor CBC, CMP  Goals of Care I judge his capacity to be limited but not absent. He appears to have a basic understanding of his situation and his options, though lethargy precludes him communicating in any great detail. He appears more alert and is more communicative 02/20/23 - 02/21/23 Intubation for procedural intervention w/ anesthesia does not violate a DNR and patient assents to this if needed  He has expressed clear general goals -  wants to do what is possible to live if medical interventions to reach this goal are judged to be futile, and/or if respiratory or cardiac distress/arrest occurs, will transition to comfort care He would like not to be kept on long term life support machines (ie if intubated for procedure and not able to wen off vent in a couple days, would like to be transitioned to comfort care)  Remains DNR I made clear to him that if he decompensates to the point that intubation is needed to maintain respiration, I do not think he would come off the ventilator - he agrees DO NOT INTUBATE IN SUCH A CASE.  I made clear to him that if he experiences severe cardiac arrhythmia or arrest, I do not think he would meaningfully recovery cardiac function despite aggressive intervention - he agrees NO CPR/DEFIBRILLATION.  in absence of ideal decision-making capacity, and in absence of a designated surrogate decision-maker or [place] other than his attending physician (in cooperation their consulting physicians), it is ethically appropriate to treat with presumed goal of preserving life, but withholding or discontinuing treatments which are judged by 2+ physicians to be medically futile.      DVT prophylaxis: SCD Pertinent IV fluids/nutrition: diet as tolerated  Central lines / invasive devices: HD cath L femoral, CVC R femoral, foley   Code Status: DNR  Current  Admission Status: inpatient  TOC needs / Dispo plan: SNF Barriers to discharge / significant pending items: medically unstable GI bleed --> circulatory shock, CRRT

## 2023-02-19 NOTE — Progress Notes (Signed)
[id number]: Arrived to overhead page of rapid response. On arrival, patient maintaining airway, BP low, has palpable pulse. Pt awake, not very responsive.Smells like patient has blood in stool. Noted maroon stool in bed. Dr. Sheppard Coil present, speaking with patient. She discussed blood transfusion with patient, patient was agreeable. Provider discussed possible colonoscopy. Pt indicated he did not want it, but could not verbalize why. Dr. Sheppard Coil explained what the colonoscopy was and asked patient if he understood if he did not agree to colonoscopy he could die, we would be unable to find source of bleeding and transfusing would not stop the bleeding. Pt indicated he understood refusing the colonoscopy could result in his death.. Fluid bolus infusing. RR team present. Pt RN present.ICU CN present.

## 2023-02-19 NOTE — Plan of Care (Signed)
Patient extubated to 15 l large bore nasal canula. Tolerating fairly well

## 2023-02-19 NOTE — Progress Notes (Signed)
An USGPIV (ultrasound guided PIV) has been placed for short-term vasopressor infusion. A correctly placed ivWatch must be used when administering Vasopressors. Should this treatment be needed beyond 72 hours, central line access should be obtained.  It will be the responsibility of the bedside nurse to follow best practice to prevent extravasations. PIV started on previuos shift by Claretha Cooper RN.

## 2023-02-19 NOTE — Progress Notes (Addendum)
Rapid response for bloody BM and hypotension Pt is alert but drowsy, pale  He is now amenable to colonoscopy if needed - informed Dr Alice Reichert Will get STAT CTA - paged Dr Lucky Cowboy w/ vascular  Ordered 2 units PRBC STAT - spoke w/ blood bank 1L fluids ordered  Critical care time spent 35 mins

## 2023-02-19 NOTE — Progress Notes (Signed)
Nurse Tech notified RN that patient had significant bowl movement. Upon inspection of BM, patient  had a significantly large bowel movement with dark / black stool. indicative of a GI Bleed. pt states his stomach hurts and he is feeling "bad". says he can't hold his bowels, but his denying stomach pain. no chest pain, dizziness, or light headedness. HR is 103 o2 96% on 2L BP 88/65(74). Neomia Glass NP notified of patient condition. Patient was bathed , linen/gowns changed, and repositioned. Katy Foust assessed pt at bedside. See MAR/orders for updated plan of care.

## 2023-02-19 NOTE — Progress Notes (Signed)
    STAT CTA confirmed active bleed in second part of duodenum  Plan of Care:  - Recommend EGD for further evaluation +/- endoscopic hemostasis - NPO - See procedure note for findings and further recommendations   Octavia Bruckner, [facility] [phone]

## 2023-02-19 NOTE — Anesthesia Preprocedure Evaluation (Addendum)
Anesthesia Evaluation  Patient identified by MRN, date of birth, ID band Patient awake    Reviewed: Allergy & Precautions, NPO status , Patient's Chart, lab work & pertinent test results  Airway Mallampati: III  TM Distance: >3 FB Neck ROM: full    Dental  (+) Poor Dentition, Missing   Pulmonary neg pulmonary ROS, shortness of breath and with exertion, COPD,  COPD inhaler, former smoker   Pulmonary exam normal  + decreased breath sounds      Cardiovascular Exercise Tolerance: Poor hypertension, Pt. on medications pulmonary hypertension+CHF and + DOE  negative cardio ROS Normal cardiovascular exam Rhythm:Regular     Neuro/Psych negative neurological ROS  negative psych ROS   GI/Hepatic negative GI ROS, Neg liver ROS,,,  Endo/Other  negative endocrine ROS    Renal/GU DialysisRenal diseasenegative Renal ROS  negative genitourinary   Musculoskeletal negative musculoskeletal ROS (+)    Abdominal Normal abdominal exam  (+)   Peds negative pediatric ROS (+)  Hematology negative hematology ROS (+)   Anesthesia Other Findings Past Medical History: No date: CHF (congestive heart failure) (HCC)     Comment:  EF 45% in 2021 No date: Chronic kidney disease 02/16/2020: COPD (chronic obstructive pulmonary disease) (HCC) No date: Hyperlipidemia No date: Hypertension 2021: Pneumonia  Past Surgical History: 10/31/2021: CATARACT EXTRACTION W/PHACO; Left     Comment:  Procedure: CATARACT EXTRACTION PHACO AND INTRAOCULAR               LENS PLACEMENT ([place]) LEFT VISION BLUE 3.45 00:48.0;                Surgeon: Leandrew Koyanagi, MD;  Location: [place];  Service: Ophthalmology;  Laterality: Left; 11/14/2021: CATARACT EXTRACTION W/PHACO; Right     Comment:  Procedure: CATARACT EXTRACTION PHACO AND INTRAOCULAR               LENS PLACEMENT ([facility]) RIGHT 8.59 01:10.5;  Surgeon:               Leandrew Koyanagi, MD;  Location: [place];              Service: Ophthalmology;  Laterality: Right; 02/10/2023: DIALYSIS/PERMA CATHETER INSERTION; Right     Comment:  Procedure: DIALYSIS/PERMA CATHETER INSERTION;  Surgeon:               Algernon Huxley, MD;  Location: [place] [facility];                Service: Cardiovascular;  Laterality: Right; 02/17/2023: DIALYSIS/PERMA CATHETER REMOVAL; Right     Comment:  Procedure: DIALYSIS/PERMA CATHETER REMOVAL;  Surgeon:               Algernon Huxley, MD;  Location: [place] [facility];                Service: Cardiovascular;  Laterality: Right; 02/04/2017: INGUINAL HERNIA REPAIR; Right     Comment:  Procedure: HERNIA REPAIR INGUINAL ADULT;  Surgeon:               Leonie Green, MD;  Location: [facility];  Service:               General;  Laterality: Right; No date: NO PAST SURGERIES 02/15/2020: [place]; Left     Comment:  Procedure: REPAIR QUADRICEP TENDON;  Surgeon: Roland Rack,  Marshall Cork, MD;  Location: [facility];  Service: Orthopedics;                Laterality: Left;  BMI    Body Mass Index: 24.44 kg/m      Reproductive/Obstetrics negative OB ROS                             Anesthesia Physical Anesthesia Plan  ASA: 4 and emergent  Anesthesia Plan: General   Post-op Pain Management:    Induction: Intravenous  PONV Risk Score and Plan: Propofol infusion and TIVA  Airway Management Planned: Natural Airway  Additional Equipment:   Intra-op Plan:   Post-operative Plan:   Informed Consent: I have reviewed the patients History and Physical, chart, labs and discussed the procedure including the risks, benefits and alternatives for the proposed anesthesia with the patient or authorized representative who has indicated his/her understanding and acceptance.     Dental Advisory Given  Plan Discussed with: CRNA and Surgeon  Anesthesia Plan Comments:        Anesthesia Quick  Evaluation

## 2023-02-19 NOTE — Significant Event (Signed)
Rapid Response Event Note   Reason for Call :  Hypotension, GI bleed  Initial Focused Assessment:  Rapid response RN arrived in patient's room with patient lying in bed surrounded by 2A and evidence of large bloody bowel movement(s) around him. Per patient's RN, patient had blood BM and subsequent hypotension. Initial BP upon arrival 63/49 MAP 56. Patient alert and able to answer questions with slight delay. HR 90s. Mucus membranes pale.  Interventions:  Dr. Sheppard Coil to beside shortly after rapid response RN arrived. She ordered 1L of NS bolus, which was started. Recheck of BP while bolus running showed BP 91/77 MAP 82. Upon further discussion with patient and review of chart she also order blood transfusion. 2A RNs looking for another IV for CTA. NT going to get blood.   Plan of Care:  Patient to remain on 2A for now. Dr. Sheppard Coil in touch with [facility] doctor and vascular surgeon about possible interventions and further diagnostic tests. No further needs from rapid response RN at this time.  Event Summary:   MD Notified: Dr. Sheppard Coil Call Time: 09:50 Arrival Time: 09:51 End Time: 10:10  Stephanie Acre, RN

## 2023-02-20 ENCOUNTER — Encounter: Payer: Self-pay | Admitting: Internal Medicine

## 2023-02-20 DIAGNOSIS — R579 Shock, unspecified: Secondary | ICD-10-CM | POA: Diagnosis not present

## 2023-02-20 DIAGNOSIS — E43 Unspecified severe protein-calorie malnutrition: Secondary | ICD-10-CM | POA: Insufficient documentation

## 2023-02-20 LAB — BASIC METABOLIC PANEL
Anion gap: 8 (ref 5–15)
Anion gap: 5 (ref 5–15)
Anion gap: 7 (ref 5–15)
BUN: 81 mg/dL — ABNORMAL HIGH (ref 8–23)
BUN: 79 mg/dL — ABNORMAL HIGH (ref 8–23)
BUN: 58 mg/dL — ABNORMAL HIGH (ref 8–23)
CO2: 25 mmol/L (ref 22–32)
CO2: 28 mmol/L (ref 22–32)
CO2: 29 mmol/L (ref 22–32)
Calcium: 8.2 mg/dL — ABNORMAL LOW (ref 8.9–10.3)
Calcium: 8 mg/dL — ABNORMAL LOW (ref 8.9–10.3)
Calcium: 7.6 mg/dL — ABNORMAL LOW (ref 8.9–10.3)
Chloride: 106 mmol/L (ref 98–111)
Chloride: 106 mmol/L (ref 98–111)
Chloride: 101 mmol/L (ref 98–111)
Creatinine, Ser: 2.82 mg/dL — ABNORMAL HIGH (ref 0.61–1.24)
Creatinine, Ser: 2.78 mg/dL — ABNORMAL HIGH (ref 0.61–1.24)
Creatinine, Ser: 2.08 mg/dL — ABNORMAL HIGH (ref 0.61–1.24)
GFR, Estimated: 22 mL/min — ABNORMAL LOW (ref >60)
GFR, Estimated: 23 mL/min — ABNORMAL LOW (ref >60)
GFR, Estimated: 32 mL/min — ABNORMAL LOW (ref >60)
Glucose, Bld: 139 mg/dL — ABNORMAL HIGH (ref 70–99)
Glucose, Bld: 167 mg/dL — ABNORMAL HIGH (ref 70–99)
Glucose, Bld: 222 mg/dL — ABNORMAL HIGH (ref 70–99)
Potassium: 6.3 mmol/L (ref 3.5–5.1)
Potassium: 6.2 mmol/L — ABNORMAL HIGH (ref 3.5–5.1)
Potassium: 4.2 mmol/L (ref 3.5–5.1)
Sodium: 139 mmol/L (ref 135–145)
Sodium: 139 mmol/L (ref 135–145)
Sodium: 137 mmol/L (ref 135–145)

## 2023-02-20 LAB — BLOOD GAS, VENOUS
Acid-base deficit: 10.4 mmol/L — ABNORMAL HIGH (ref 0.0–2.0)
Bicarbonate: 15.7 mmol/L — ABNORMAL LOW (ref 20.0–28.0)
O2 Saturation: 77.3 %
Patient temperature: 37
pCO2, Ven: 35 mmHg — ABNORMAL LOW (ref 44–60)
pH, Ven: 7.26 (ref 7.25–7.43)
pO2, Ven: 42 mmHg (ref 32–45)

## 2023-02-20 LAB — CBC
HCT: 25.4 % — ABNORMAL LOW (ref 39.0–52.0)
Hemoglobin: 8.2 g/dL — ABNORMAL LOW (ref 13.0–17.0)
MCH: 30.1 pg (ref 26.0–34.0)
MCHC: 32.3 g/dL (ref 30.0–36.0)
MCV: 93.4 fL (ref 80.0–100.0)
Platelets: 129 10*3/uL — ABNORMAL LOW (ref 150–400)
RBC: 2.72 MIL/uL — ABNORMAL LOW (ref 4.22–5.81)
RDW: 18.6 % — ABNORMAL HIGH (ref 11.5–15.5)
WBC: 33.1 10*3/uL — ABNORMAL HIGH (ref 4.0–10.5)
nRBC: 0.1 % (ref 0.0–0.2)

## 2023-02-20 LAB — BLOOD GAS, ARTERIAL
Acid-base deficit: 1.4 mmol/L (ref 0.0–2.0)
Bicarbonate: 26.6 mmol/L (ref 20.0–28.0)
O2 Content: 10 L/min
O2 Saturation: 54 %
Patient temperature: 37
pCO2 arterial: 58 mmHg — ABNORMAL HIGH (ref 32–48)
pH, Arterial: 7.27 — ABNORMAL LOW (ref 7.35–7.45)
pO2, Arterial: 36 mmHg — CL (ref 83–108)

## 2023-02-20 LAB — RENAL FUNCTION PANEL
Albumin: 2.2 g/dL — ABNORMAL LOW (ref 3.5–5.0)
Albumin: 2.1 g/dL — ABNORMAL LOW (ref 3.5–5.0)
Anion gap: 7 (ref 5–15)
Anion gap: 5 (ref 5–15)
BUN: 82 mg/dL — ABNORMAL HIGH (ref 8–23)
BUN: 78 mg/dL — ABNORMAL HIGH (ref 8–23)
CO2: 25 mmol/L (ref 22–32)
CO2: 29 mmol/L (ref 22–32)
Calcium: 8.3 mg/dL — ABNORMAL LOW (ref 8.9–10.3)
Calcium: 7.9 mg/dL — ABNORMAL LOW (ref 8.9–10.3)
Chloride: 106 mmol/L (ref 98–111)
Chloride: 103 mmol/L (ref 98–111)
Creatinine, Ser: 2.91 mg/dL — ABNORMAL HIGH (ref 0.61–1.24)
Creatinine, Ser: 2.85 mg/dL — ABNORMAL HIGH (ref 0.61–1.24)
GFR, Estimated: 22 mL/min — ABNORMAL LOW (ref >60)
GFR, Estimated: 22 mL/min — ABNORMAL LOW (ref >60)
Glucose, Bld: 130 mg/dL — ABNORMAL HIGH (ref 70–99)
Glucose, Bld: 211 mg/dL — ABNORMAL HIGH (ref 70–99)
Phosphorus: 4.5 mg/dL (ref 2.5–4.6)
Phosphorus: 4.6 mg/dL (ref 2.5–4.6)
Potassium: 6.6 mmol/L (ref 3.5–5.1)
Potassium: 5.7 mmol/L — ABNORMAL HIGH (ref 3.5–5.1)
Sodium: 138 mmol/L (ref 135–145)
Sodium: 137 mmol/L (ref 135–145)

## 2023-02-20 LAB — HEMOGLOBIN AND HEMATOCRIT, BLOOD
HCT: 25.6 % — ABNORMAL LOW (ref 39.0–52.0)
HCT: 22.2 % — ABNORMAL LOW (ref 39.0–52.0)
HCT: 22.4 % — ABNORMAL LOW (ref 39.0–52.0)
Hemoglobin: 8.3 g/dL — ABNORMAL LOW (ref 13.0–17.0)
Hemoglobin: 7.1 g/dL — ABNORMAL LOW (ref 13.0–17.0)
Hemoglobin: 6.9 g/dL — ABNORMAL LOW (ref 13.0–17.0)

## 2023-02-20 LAB — GLUCOSE, CAPILLARY: Glucose-Capillary: 174 mg/dL — ABNORMAL HIGH (ref 70–99)

## 2023-02-20 LAB — MAGNESIUM: Magnesium: 1.8 mg/dL (ref 1.7–2.4)

## 2023-02-20 MED ORDER — PANTOPRAZOLE 80MG IVPB - SIMPLE MED
80.0000 mg | Freq: Once | INTRAVENOUS | Status: AC
  Administered 2023-02-20: 80 mg via INTRAVENOUS
  Filled 2023-02-20: qty 100

## 2023-02-20 MED ORDER — NOREPINEPHRINE 16 MG/250ML-% IV SOLN
0.0000 ug/min | INTRAVENOUS | Status: DC
  Administered 2023-02-20: 22 ug/min via INTRAVENOUS
  Administered 2023-02-20: 20 ug/min via INTRAVENOUS
  Administered 2023-02-21: 12 ug/min via INTRAVENOUS
  Filled 2023-02-20 (×2): qty 250

## 2023-02-20 MED ORDER — PRISMASOL BGK 0/2.5 32-2.5 MEQ/L EC SOLN: Status: DC

## 2023-02-20 MED ORDER — SODIUM CHLORIDE 0.9 % FOR CRRT: INTRAVENOUS_CENTRAL | Status: DC | PRN

## 2023-02-20 MED ORDER — DEXTROSE 50 % IV SOLN
1.0000 | Freq: Once | INTRAVENOUS | Status: AC
  Administered 2023-02-20: 50 mL via INTRAVENOUS
  Filled 2023-02-20: qty 50

## 2023-02-20 MED ORDER — NOREPINEPHRINE 16 MG/250ML-% IV SOLN
0.0000 ug/min | INTRAVENOUS | Status: DC
  Administered 2023-02-20: 10 ug/min via INTRAVENOUS
  Filled 2023-02-20: qty 250

## 2023-02-20 MED ORDER — INSULIN ASPART 100 UNIT/ML IV SOLN
10.0000 [IU] | Freq: Once | INTRAVENOUS | Status: AC
  Administered 2023-02-20: 10 [IU] via INTRAVENOUS
  Filled 2023-02-20: qty 0.1

## 2023-02-20 MED ORDER — PRISMASOL BGK 0/2.5 32-2.5 MEQ/L REPLACEMENT SOLN: Status: DC

## 2023-02-20 MED ORDER — SODIUM BICARBONATE 8.4 % IV SOLN
50.0000 meq | Freq: Once | INTRAVENOUS | Status: AC
  Administered 2023-02-20: 50 meq via INTRAVENOUS
  Filled 2023-02-20: qty 50

## 2023-02-20 MED ORDER — RENA-VITE PO TABS
1.0000 | ORAL_TABLET | Freq: Every day | ORAL | Status: DC
  Administered 2023-02-20 – 2023-03-03 (×12): 1 via ORAL
  Filled 2023-02-20 (×12): qty 1

## 2023-02-20 MED ORDER — PANTOPRAZOLE SODIUM 40 MG IV SOLR
40.0000 mg | Freq: Two times a day (BID) | INTRAVENOUS | Status: DC
  Administered 2023-02-20 – 2023-02-25 (×11): 40 mg via INTRAVENOUS
  Filled 2023-02-20 (×11): qty 10

## 2023-02-20 MED ORDER — BOOST / RESOURCE BREEZE PO LIQD CUSTOM
1.0000 | Freq: Three times a day (TID) | ORAL | Status: DC
  Administered 2023-02-20 – 2023-02-21 (×3): 1 via ORAL

## 2023-02-20 MED ORDER — VASOPRESSIN 20 UNITS/100 ML INFUSION FOR SHOCK
0.0000 [IU]/min | INTRAVENOUS | Status: DC
  Administered 2023-02-20: 0.04 [IU]/min via INTRAVENOUS
  Filled 2023-02-20: qty 100

## 2023-02-20 MED ORDER — CHLORHEXIDINE GLUCONATE CLOTH 2 % EX PADS
6.0000 | MEDICATED_PAD | Freq: Every day | CUTANEOUS | Status: DC
  Administered 2023-02-20 – 2023-02-28 (×10): 6 via TOPICAL

## 2023-02-20 MED ORDER — SODIUM CHLORIDE 0.9 % IV BOLUS
1000.0000 mL | Freq: Once | INTRAVENOUS | Status: AC
  Administered 2023-02-20: 1000 mL via INTRAVENOUS

## 2023-02-20 MED ORDER — LIDOCAINE HCL 1 % IJ SOLN
INTRAMUSCULAR | Status: AC
  Filled 2023-02-20: qty 10

## 2023-02-20 MED ORDER — VASOPRESSIN 20 UNITS/100 ML INFUSION FOR SHOCK
0.0000 [IU]/min | INTRAVENOUS | Status: DC
  Administered 2023-02-20 – 2023-02-22 (×6): 0.04 [IU]/min via INTRAVENOUS
  Filled 2023-02-20 (×6): qty 100

## 2023-02-20 MED ORDER — ORAL CARE MOUTH RINSE: 15.0000 mL | OROMUCOSAL | Status: DC | PRN

## 2023-02-20 MED ORDER — HEPARIN SODIUM (PORCINE) 1000 UNIT/ML DIALYSIS
1000.0000 [IU] | INTRAMUSCULAR | Status: DC | PRN
  Administered 2023-02-21: 1400 [IU] via INTRAVENOUS_CENTRAL
  Administered 2023-02-22: 2800 [IU] via INTRAVENOUS_CENTRAL
  Filled 2023-02-20 (×3): qty 3

## 2023-02-20 MED ORDER — FENTANYL CITRATE PF 50 MCG/ML IJ SOSY
PREFILLED_SYRINGE | INTRAMUSCULAR | Status: AC
  Administered 2023-02-20: 50 ug via INTRAVENOUS
  Filled 2023-02-20: qty 1

## 2023-02-20 MED ORDER — SODIUM BICARBONATE 8.4 % IV SOLN
INTRAVENOUS | Status: DC
  Filled 2023-02-20 (×2): qty 150
  Filled 2023-02-20: qty 1000

## 2023-02-20 MED ORDER — VITAMIN C 500 MG PO TABS
500.0000 mg | ORAL_TABLET | Freq: Two times a day (BID) | ORAL | Status: DC
  Administered 2023-02-20 – 2023-03-04 (×24): 500 mg via ORAL
  Filled 2023-02-20 (×25): qty 1

## 2023-02-20 MED ORDER — PANTOPRAZOLE INFUSION (NEW) - SIMPLE MED
8.0000 mg/h | INTRAVENOUS | Status: DC
  Administered 2023-02-20: 8 mg/h via INTRAVENOUS
  Filled 2023-02-20: qty 100

## 2023-02-20 MED ORDER — SODIUM CHLORIDE 0.9% IV SOLUTION: Freq: Once | INTRAVENOUS | Status: AC

## 2023-02-20 MED ORDER — HEPARIN SODIUM (PORCINE) 1000 UNIT/ML DIALYSIS: 1000.0000 [IU] | INTRAMUSCULAR | Status: DC | PRN

## 2023-02-20 MED ORDER — FENTANYL CITRATE PF 50 MCG/ML IJ SOSY: 50.0000 ug | PREFILLED_SYRINGE | Freq: Once | INTRAMUSCULAR | Status: AC

## 2023-02-20 NOTE — Progress Notes (Signed)
[name]  ROUNDING NOTE   Subjective:   Mr. Adriyan Carroway was admitted to [facility] on 01/29/2023 for Peripheral edema [R60.9] Shock circulatory ([place]) [R57.9] Acute on chronic congestive heart failure, unspecified heart failure type ([place]) [I50.9] Acute renal failure superimposed on chronic kidney disease, unspecified acute renal failure type, unspecified CKD stage ([place]) [N17.9, N18.9]  GIB - EGD with nonbleeding ulcer. Patient moved to ICU.   Now on vasopressin and norepinephrine.   Patient is awake and in pain    HCNC  Hyperkalemia - oral potassium binders ordered.   Objective:  Vital signs in last 24 hours:  Temp:  [96.3 F (35.7 C)-98.5 F (36.9 C)] 97.7 F (36.5 C) (03/07 0715) Pulse Rate:  [25-110] 101 (03/07 0600) Resp:  [13-35] 21 (03/07 0915) BP: (61-186)/(7-148) 95/55 (03/07 0915) SpO2:  [3 %-100 %] 91 % (03/07 0600) FiO2 (%):  [100 %] 100 % (03/06 1650) Weight:  [82.1 kg] 82.1 kg (03/07 0500)  Weight change: 4 kg Filed Weights   02/19/23 0350 02/19/23 0802 02/20/23 0500  Weight: 75.5 kg 79.5 kg 82.1 kg    Intake/Output: I/O last 3 completed shifts: In: 3066.2 [I.V.:834; Blood:1806.7; IV Piggyback:425.5] Out: 600 [Urine:600]   Intake/Output this shift:  Total I/O In: 44.6 [I.V.:44.6] Out: 100 [Urine:100]  Physical Exam: General: Ill    Head: Normocephalic, atraumatic   Eyes: Anicteric  Lungs:  HFNC  Heart: regular  Abdomen:  Soft, nontender  Extremities: + peripheral edema   Neurologic: Alert and oriented  Skin: Left lower leg wound in clean and dry dressing  Access: none    Basic Metabolic Panel: Recent Labs  Lab 02/16/23 0403 02/17/23 0542 02/17/23 0543 02/18/23 0730 02/18/23 0731 02/19/23 0602 02/19/23 0604 02/19/23 1734 02/20/23 0553 02/20/23 0646  NA 137  --  139  --  135 139  --  139 138 139  K 3.7  --  3.7  --  3.9 5.1  --  5.1 6.6* 6.3*  CL 99  --  101  --  101 102  --  107 106 106  CO2 29  --  31  --  31 32   --  '25 25 25  '$ GLUCOSE 113*  --  105*  --  97 117*  --  168* 130* 139*  BUN 60*  --  51*  --  51* 70*  --  71* 82* 81*  CREATININE 2.45*  --  2.30*  --  2.11* 2.35*  --  2.33* 2.91* 2.82*  CALCIUM 9.1  --  9.1  --  9.1 8.7*  --  7.6* 8.3* 8.2*  MG 2.3 2.2  --  2.1  --  2.1  --   --  1.8  --   PHOS 3.2  --  2.9  --  3.3  --  2.4*  --  4.5  --      Liver Function Tests: Recent Labs  Lab 02/17/23 0543 02/18/23 0731 02/19/23 0602 02/19/23 1734 02/20/23 0553  AST  --   --  32 35  --   ALT  --   --  24 22  --   ALKPHOS  --   --  97 64  --   BILITOT  --   --  0.8 1.5*  --   PROT  --   --  5.1* 4.1*  --   ALBUMIN 2.5* 2.5* 2.2* 2.2* 2.2*    No results for input(s): "LIPASE", "AMYLASE" in the last 168 hours. No  results for input(s): "AMMONIA" in the last 168 hours.   CBC: Recent Labs  Lab 02/16/23 0403 02/17/23 0542 02/18/23 0730 02/19/23 0602 02/19/23 0840 02/19/23 1734 02/19/23 2325 02/20/23 0553 02/20/23 0646  WBC 9.2 7.7 6.1 9.1  --  16.0*  --   --  33.1*  NEUTROABS 7.6 6.2 4.9 7.2  --  12.4*  --   --   --   HGB 11.6* 11.8* 11.9* 9.3* 8.3* 7.6* 9.6* 8.3* 8.2*  HCT 37.1* 38.1* 37.5* 30.0* 25.7* 25.2* 30.0* 25.6* 25.4*  MCV 103.3* 103.3* 101.9* 103.1*  --  98.1  --   --  93.4  PLT 186 197 184 192  --  129*  --   --  129*     Cardiac Enzymes: No results for input(s): "CKTOTAL", "CKMB", "CKMBINDEX", "TROPONINI" in the last 168 hours.  BNP: Invalid input(s): "POCBNP"  CBG: Recent Labs  Lab 02/19/23 0952  GLUCAP 49     Microbiology: Results for orders placed or performed during the hospital encounter of 01/29/23  Culture, Respiratory w Gram Stain     Status: None   Collection Time: 01/29/23  3:45 AM   Specimen: Tracheal Aspirate; Respiratory  Result Value Ref Range Status   Specimen Description   Final    TRACHEAL ASPIRATE Performed at [facility], [address], [place], [place] [address]    Special Requests   Final    NONE Performed at  [facility], [address] [place], [place] [address]    Gram Stain   Final    MODERATE WBC PRESENT,BOTH PMN AND MONONUCLEAR NO ORGANISMS SEEN    Culture   Final    RARE Normal respiratory flora-no Staph aureus or Pseudomonas seen Performed at [facility] [address], [place], [place] [address]    Report Status 02/01/2023 FINAL  Final  Culture, blood (Routine X 2) w Reflex to ID Panel     Status: None   Collection Time: 01/29/23  1:10 PM   Specimen: BLOOD  Result Value Ref Range Status   Specimen Description BLOOD BLOOD LEFT ARM  Final   Special Requests   Final    BOTTLES DRAWN AEROBIC AND ANAEROBIC Blood Culture adequate volume   Culture   Final    NO GROWTH 5 DAYS Performed at [facility], [address], [place], [place] [address]    Report Status 02/03/2023 FINAL  Final  MRSA Next Gen by PCR, Nasal     Status: None   Collection Time: 01/29/23  1:47 PM   Specimen: Nasal Mucosa; Nasal Swab  Result Value Ref Range Status   MRSA by PCR Next Gen NOT DETECTED NOT DETECTED Final    Comment: (NOTE) The GeneXpert MRSA Assay (FDA approved for NASAL specimens only), is one component of a comprehensive MRSA colonization surveillance program. It is not intended to diagnose MRSA infection nor to guide or monitor treatment for MRSA infections. Test performance is not FDA approved in patients less than 33 years old. Performed at [facility], [address] [place], [place] [address]   Culture, blood (Routine X 2) w Reflex to ID Panel     Status: None   Collection Time: 01/29/23  2:10 PM   Specimen: BLOOD  Result Value Ref Range Status   Specimen Description BLOOD A-LINE  Final   Special Requests   Final    BOTTLES DRAWN AEROBIC AND ANAEROBIC Blood Culture adequate volume   Culture   Final  NO GROWTH 5 DAYS Performed at [facility], [address] [place], [place] [address]    Report Status 02/03/2023 FINAL  Final     Coagulation Studies: Recent Labs    02/19/23 0602  LABPROT 14.8  INR 1.2     Urinalysis: No results for input(s): "COLORURINE", "LABSPEC", "PHURINE", "GLUCOSEU", "HGBUR", "BILIRUBINUR", "KETONESUR", "PROTEINUR", "UROBILINOGEN", "NITRITE", "LEUKOCYTESUR" in the last 72 hours.  Invalid input(s): "APPERANCEUR"     Imaging: CT ANGIO GI BLEED  Result Date: 02/19/2023 CLINICAL DATA:  Bloody bowel movement. Concern for active gastrointestinal bleeding. EXAM: CTA ABDOMEN AND PELVIS WITHOUT AND WITH CONTRAST TECHNIQUE: Multidetector CT imaging of the abdomen and pelvis was performed using the standard protocol during bolus administration of intravenous contrast. Multiplanar reconstructed images and MIPs were obtained and reviewed to evaluate the vascular anatomy. RADIATION DOSE REDUCTION: This exam was performed according to the departmental dose-optimization program which includes automated exposure control, adjustment of the mA and/or kV according to patient size and/or use of iterative reconstruction technique. CONTRAST:  51m OMNIPAQUE IOHEXOL 350 MG/ML SOLN COMPARISON:  None Available. FINDINGS: VASCULAR Aorta: Atherosclerotic calcification of the aorta. Celiac: Patent without evidence of aneurysm, dissection, vasculitis or significant stenosis. SMA: Patent without evidence of aneurysm, dissection, vasculitis or significant stenosis. Renals: Both renal arteries are patent without evidence of aneurysm, dissection, vasculitis, fibromuscular dysplasia or significant stenosis. IMA: Patent without evidence of aneurysm, dissection, vasculitis or significant stenosis. Inflow: Patent without evidence of aneurysm, dissection, vasculitis or significant stenosis. Proximal Outflow: Aortic calcification. Atherosclerotic calcification. No aneurysm. Veins: No obvious venous abnormality within the limitations of this arterial phase study. Review of the MIP images confirms the above findings. NON-VASCULAR Lower  chest: Lung bases are clear. Hepatobiliary: No focal hepatic lesion. Normal gallbladder. No biliary duct dilatation. Common bile duct is normal. Pancreas: Pancreas is normal. No ductal dilatation. No pancreatic inflammation. Spleen: Normal spleen Adrenals/urinary tract: Adrenal glands and kidneys are normal. The ureters and bladder normal. Stomach/Bowel: Stomach is normal. On delayed portal venous phase imaging there is a collection of endoluminal contrast within the second portion the duodenum (image 32/11). Findings consistent with active CT chest of bleeding. On arterial phase imaging there is a tiny focus of intraluminal contrast at this site (image 67/5). No evidence of active gastrointestinal bleeding within the small bowel. No small bowel inflammation or dilatation. There is high-density material within the colon. No evidence of active extravasation IV contrast into the lumen of the LEFT or RIGHT colon. Rectosigmoid colon likewise unremarkable except for several diverticula. Rectum unremarkable. Vascular/Lymphatic: Abdominal aorta is normal caliber with atherosclerotic calcification. There is no retroperitoneal or periportal lymphadenopathy. No pelvic lymphadenopathy. Reproductive: Prostate unremarkable Other: Mild sarcoma of the soft tissues. Musculoskeletal: Degenerative osteophytosis of the spine. IMPRESSION: VASCULAR 1. Active gastrointestinal bleeding in the second portion duodenum evident on the portal venous phase imaging. 2. Atherosclerotic calcification of the aorta is branches without high-grade stenosis. NON-VASCULAR 1. No evidence of bowel obstruction or inflammation. These results will be called to the ordering clinician or representative by the Radiologist Assistant, and communication documented in the PACS or [employer] Electronically Signed   By: SSuzy BouchardM.D.   On: 02/19/2023 13:59     Medications:    sodium chloride 10 mL/hr at 02/20/23 0800   norepinephrine (LEVOPHED)  Adult infusion 20 mcg/min (02/20/23 0935)   sodium bicarbonate 150 mEq in dextrose 5 % 1,150 mL infusion     vasopressin 0.04 Units/min (02/20/23 0937)     Chlorhexidine Gluconate  Cloth  6 each Topical Daily     Assessment/ Plan:  Mr. Eiji Bodle is a 77 y.o. black male with chronic diastolic congestive heart failure, hypertension, COPD, hyperlipidemia, who is admitted to [facility] on 01/29/2023 for Peripheral edema [R60.9] Shock circulatory ([place]) [R57.9] Acute on chronic congestive heart failure, unspecified heart failure type ([place]) [I50.9] Acute renal failure superimposed on chronic kidney disease, unspecified acute renal failure type, unspecified CKD stage ([place]) [N17.9, N18.9]  Acute Kidney Injury on chronic kidney disease stage IV: baseline creatinine of 2.67, GFR of 24 on 01/06/23. History of bland urine. Acute kidney injury most likely secondary to cardiorenal syndrome. Now with acute kidney injury with hyperkalemia secondary to hypovolemic shock and GI bleed. Chronic kidney disease secondary to hypertension. Required CRRT from 2/16 - 2/20. Intermitted hemodialysis on 2/21 with hypotension and thrombosis. Did not tolerate treatment. The next day, patient said he did not want dialysis and his dialysis catheter was removed. However on 2/23, patient says he is open to dialysis if necessary.  Start bicarb gtt Low threshold for renal replacement therapy. Patient will need a new dialysis access if patient does not improve on with bicarb gtt.   Acute respiratory failure and Acute exacerbation of chronic diastolic congestive heart failure and acute respiratory failure requiring intubation and mechanical ventilation.  Extubated on 02/05/23. Home regimen was torsemide '60mg'$  daily and metolazone 2.'5mg'$  twice a week.   On HFNC. Patient is DNI. Appreciate critical care input.   Hypertension: with hypotension with cardiogenic shock on admission. Now requiring vasopressors: norepinephrine and vasopressin.     4. Anemia of chronic kidney disease with acute blood loss Lab Results  Component Value Date   HGB 8.2 (L) 02/20/2023    Status post PRBC transfusion. With GI bleed. Appreciate GI input.     LOS: 22 Daulton Harbaugh 3/7/202411:19 AM

## 2023-02-20 NOTE — Progress Notes (Signed)
CRRT started with no complication.  Continue to assess.

## 2023-02-20 NOTE — Progress Notes (Signed)
Nutrition Follow Up Note   DOCUMENTATION CODES:   Severe malnutrition in context of acute illness/injury  INTERVENTION:   Recommend NGT and nutrition support if patient is unable to take in sufficient oral intake  Boost Breeze po TID, each supplement provides 250 kcal and 9 grams of protein  Rena-vit po daily   Vitamin C '500mg'$  po BID   Pt at high refeed risk; recommend monitor potassium, magnesium and phosphorus labs daily until stable  Daily weights   NUTRITION DIAGNOSIS:   Severe Malnutrition related to acute illness as evidenced by moderate fat depletion, severe fat depletion, moderate muscle depletion. -new diagnosis   GOAL:   Patient will meet greater than or equal to 90% of their needs -progressing   MONITOR:   PO intake, Supplement acceptance, Diet advancement, Labs, Weight trends, I & O's, Skin  ASSESSMENT:   77 y/o male with h/o COPD, CHF, HTN, CKD IV, aortic aneurysm and HLD who is admitted with circulatory shock, CHF, encephalopaty and new ESRD. Pt with cirrrhosis noted on CT scan.  -Pt s/p CRRT 2/16-2/20 -Pt s/p HD initiation 2/26, permath removed 3/4 -Pt s/p SLP eval 2/27; ok for regular diet  -Pt s/p EGD 3/6; pt found to have multiple non-bleeding ulcers.   Pt developed GIB and hypovolemic shock 3/6. Pt s/p EGD and transferred to the ICU after procedure. Pt extubated today.   Met with pt in room today. Pt reports that he is not feeling well. Pt reports poor appetite and oral intake. Pt has had fairly poor oral intake since admission. Pt is documented to have eaten 80% of lunch on 3/5 with meal assist. RD discussed with pt the importance of adequate nutrition needed to preserve lean muscle and replace losses from HD. NGT placement and tube feeds were discussed with patient today. Pt reports that he would like to try to drink strawberry Ensure before having tube placed; however, patient has been refusing most of the Ensure this admission. Pt reports that he is  willing to have NGT placed if he really needs it but he would prefer to try and eat first. Pt currently on clear liquids; RD will add Boost Breeze and vitamins. RD highly suspects patient with scurvy; will supplement vitamin C. No further bloody stools noted today. Pt with hyperkalemia; pt has decided again to have HD. Plan is for new dialysis access today.   Per chart, pt is down ~37lbs since admission. Pt -10.1L on his I & Os. Pt is down ~14lbs from his UBW. Pt with new muscle and fat depletions on exam today. Pt meets criteria for malnutrition.   Medications reviewed and include: levophed, Na bicarbonate, vasopressin   Labs reviewed: K 6.3(H), BUN 81(H), creat 2.82(H), P 4.5 wnl, Mg 1.8 wnl WBC- 33.1(H), Hgb 8.2(L), Hct 25.4(L)  Nutrition Focused Physical Exam:  Flowsheet Row Most Recent Value  Orbital Region No depletion  Upper Arm Region Severe depletion  Thoracic and Lumbar Region Moderate depletion  Buccal Region No depletion  Temple Region Moderate depletion  Clavicle Bone Region Moderate depletion  Clavicle and Acromion Bone Region Moderate depletion  Scapular Bone Region Mild depletion  Dorsal Hand No depletion  Patellar Region Moderate depletion  Anterior Thigh Region Moderate depletion  Posterior Calf Region Moderate depletion  Edema (RD Assessment) Mild  Hair Reviewed  Eyes Reviewed  Mouth Reviewed  Skin Reviewed  Nails Reviewed   Diet Order:   Diet Order  Diet clear liquid Room service appropriate? Yes; Fluid consistency: Thin  Diet effective now                  EDUCATION NEEDS:   Education needs have been addressed  Skin:  Skin Assessment: Reviewed RN Assessment (Partial thickness wounds LE and right upper back; LE related to edema)  Last BM:  3/6- type 6  Height:   Ht Readings from Last 1 Encounters:  01/29/23 '5\' 11"'$  (1.803 m)    Weight:   Wt Readings from Last 1 Encounters:  02/20/23 82.1 kg    Ideal Body Weight:  78  kg  BMI:  Body mass index is 25.24 kg/m.  Estimated Nutritional Needs:   Kcal:  1800-2100kcal/day  Protein:  90-105g/day  Fluid:  1000 ml +UOP  Koleen Distance MS, RD, LDN Please refer to [facility] for RD and/or RD on-call/weekend/after hours pager

## 2023-02-20 NOTE — Progress Notes (Addendum)
PROGRESS NOTE    John [name]   [id number] DOB: September 26, 1946  DOA: 01/29/2023 Date of Service: 02/20/23 PCP: Alisa Graff, FNP     Brief Narrative / Hospital Course:  77 year old male with a history of essential hypertension, on hydralazine and Cozaar, stage IV CKD but has not been on dialysis in the past, has chronic diastolic and systolic heart failure with cor pulmonale, moderate tricuspid regurgitation and aortic valve regurgitation, history of ascending aortic aneurysm, coronary artery disease with atherosclerosis, COPD and chronic hypoxemia baseline 2 L/min supplemental oxygen at home with recurrent episodes of hypoxemia and remote acute exacerbation of COPD with hypoxemia and hypercapnia 2 months ago history of ruptured left quadricep tendon 2 years ago, chronic thrombocytopenia and erythrocytosis recurrent lower extremity cellulitis, recurrent hyperkalemia, morbid obesity history of anasarca dyslipidemia, recent admission for acute CHF exacerbation requiring Lasix drip and BiPAP support, who was brought in by EMS due to worsening altered mental status and circulatory shock.    Patient received CRRT.  He also required intubation on account of worsening respiratory function.  Was able to be extubated.  He made decision for no dialysis and as such dialysis catheter was removed. Patient had discussion with the girlfriend and later on requested to be on dialysis.  Dialysis catheter was then replaced and dialysis was resumed.  Later nephrologist recommended removal of dialysis catheter given renal recovery.    Patient was medically stable pending placement in a skilled nursing facility when he developed profound GI bleeding early morning 02/19/23, GO consulted, CTA done despite renal function given emergent nature, (+)duodenal bleed, taken for emergent EGD. See IPAL note - pt has also asked [place] NOT to inform his girlfriend [place]. Fluid resuscitation and PRBC pending EGD, known risk w/ CHF  and previous dialysis.  As of 02/20/23 extubated post-procedure and on pressor support, see GOC discussion in progress note, may need dialysis, hyperkalemic.     Consultants:  Gastroenterology  ICU Palliative Care  Cardiology Nephrology Vascular Surgery   Procedures: Emergent EGD 02/19/23 for duodenal bleed 01/29/23 Insertion of Non-tunneled Central Venous Catheter with US guidance        ASSESSMENT & PLAN:   Principal Problem:   Shock circulatory ([place]) Active Problems:   Acute on chronic congestive heart failure (HCC)   Acute hypoxic respiratory failure (HCC)   Toxic metabolic encephalopathy  Hypovolemic shock d/t ABLA - GI bleed GI bleed w/ dark bloody stool 02/19/23, see notes from earlier re: rapid response and IPAL  EGD as above - duodenal ulceration no active bleed noted Continues to have bloody BM but this may be residual  S/p 3 units PRBC Trend CBC/HH Protonix gtt Pressors to titrate --> currently max on vasopressin and submax on norepi No anticoags  Fluid resuscitation and PRBC yesterday prior to EGD, known risk w/ CHF and previous dialysis - monitor fluid status closely   Hyperkalemia Treating and following BMP May need dialysis - temporary for limited trial period would be appropriate but if not recovering then would discontinue this and transition to comfort measures   Goals of Care Intubation for procedural intervention w/ anesthesia does not violate a DNR and patient assents He appears to understand his situation and his options though lethargy precludes him communicating in any great detail though he appears more alert and is more communicative today 02/20/23 - I judge his capacity to be limited but not absent.   He now states we may contact [name] to give her updates only, she is not  his decision-maker He agrees that his attending physicians may make decisions if he is totally unable to - he would like not to be kept on long term life support  machines. He states today 02/20/23 "anything that can get me ahead, I'm willing to do" and I clarified that this means he is amenable to pressors, endoscopic procedures, dialysis. He would not want surgery.  I made clear to him that if he declines to the point that intubation is needed to maintain respiration, I do tno think he would come off the ventilatory - he agrees DO NOT INTUBATE IN SUCH A CASE.  I made clear to him that if he experiences severe cardiac arrhythmia or arrest, I do not think he would meaningfully recovery cardiac function despite aggressive intervention - he agrees NO CPR/DEFIBRILLATION.  Palliative care following and will update Jerelyn Scott ENCOURAGE completion of MOST form and advanced directive if/when able.    Cardiogenic shock POA - improved but now hypovolemic shock as above  Acute decompensated HFpEF and right-sided heart failure POA - improved but now concern for worsening as above  RV dysfunction and dilation on echocardiography, concerning for RV failure and pulmonary hypertension. Porto-pulmonary hypertension on the differential given history of liver cirrhosis. Was on CVVH for ultra-filtration for volume removal Cardiology has signed off, may need to reengage but no further diagnostics from their standpoint. Nephrology is on board we appreciate input 02/19/23 w/ GI bleed and hypovolemic shock he underwent aggressive fluid resuscitation and PRBC pending EGD, known risk w/ CHF and previous dialysis - monitor fluid status closely    Acute Hypoxic Respiratory Failure d/t HFpEF - resolved Patient extubated successfully to nasal cannula on 2/22.  Reintubated for EGD procedure, now off ventilatory and on O2 support via [place] Continue supplemental O2 NO REINTUBATION if respiratory distress    Toxic Metabolic Encephalopathy - improved but waxes and wanes He appears to understand his situation and his options though lethargy precludes him communicating in any great detail  though he appears more alert and is more communicative today 02/20/23  I judge his capacity to be limited but not absent.     Acute kidney injury on chronic kidney disease stage IV Was on CRRT given worsening renal failure with hypotension-now hyperkalemic as above Patient receiving intermittent Lasix as recommended by nephrologist.  Last dose 02/13/2023   Hepatic cirrhosis Thrombocytopenia Does confer a poor prognosis.   Was on cefepime empirically given leukocytosis.  Cultures remain negative.  Finished 5 days of antibiotics. Was d/c but now On Ceftriaxone SBP Ppx    DVT prophylaxis: SCD Pertinent IV fluids/nutrition: IV boluses, npo for now Central lines / invasive devices: none  Code Status: DNR  Current Admission Status: inpatient  TOC needs / Dispo plan: SNF Barriers to discharge / significant pending items: medically unstable GI bleed --> circulatory shock, may need dialysis              Subjective / Brief ROS:  Patient reports "anything that can get me ahead, I'm willing to do" and this was clarified as above in [place] section of A/P. He reports he cannot hold his bowels. He reports abdominal pain. NO chest pain or shortness of breath. He reports he is tired.    Family Communication: palliative care team to update Nancy    Objective Findings:  Vitals:   02/20/23 0800 02/20/23 0900 02/20/23 0905 02/20/23 0915  BP: (!) 85/51  (!) 86/53 (!) 95/55  Pulse:      Resp: Marland Kitchen)  $'22 18 15 'j$ (!) 21  Temp:      TempSrc:      SpO2:      Weight:      Height:        Intake/Output Summary (Last 24 hours) at 02/20/2023 0940 Last data filed at 02/20/2023 0800 Gross per 24 hour  Intake 3110.79 ml  Output --  Net 3110.79 ml   Filed Weights   02/19/23 0350 02/19/23 0802 02/20/23 0500  Weight: 75.5 kg 79.5 kg 82.1 kg    Examination:  Physical Exam Constitutional:      General: He is not in acute distress.    Appearance: He is ill-appearing.  Cardiovascular:     Rate  and Rhythm: Regular rhythm. Tachycardia present.  Pulmonary:     Effort: Pulmonary effort is normal.     Breath sounds: Normal breath sounds.  Abdominal:     Tenderness: There is abdominal tenderness. There is no guarding or rebound.  Musculoskeletal:     Right lower leg: No edema.     Left lower leg: No edema.  Skin:    General: Skin is dry.     Coloration: Skin is pale.  Neurological:     General: No focal deficit present.     Mental Status: He is alert. Mental status is at baseline.  Psychiatric:        Behavior: Behavior normal.          Scheduled Medications:   Chlorhexidine Gluconate Cloth  6 each Topical Daily   insulin aspart  10 Units Intravenous Once   And   dextrose  1 ampule Intravenous Once   sodium bicarbonate  50 mEq Intravenous Once    Continuous Infusions:  sodium chloride 10 mL/hr at 02/20/23 0800   norepinephrine (LEVOPHED) Adult infusion 20 mcg/min (02/20/23 0935)   pantoprazole     sodium chloride     vasopressin 0.04 Units/min (02/20/23 0937)    PRN Medications:    Antimicrobials from admission:  Anti-infectives (From admission, onward)    Start     Dose/Rate Route Frequency Ordered Stop   02/19/23 0645  cefTRIAXone (ROCEPHIN) 1 g in sodium chloride 0.9 % 100 mL IVPB  Status:  Discontinued        1 g 200 mL/hr over 30 Minutes Intravenous Every 24 hours 02/19/23 0556 02/20/23 0844   02/11/23 0000  ceFAZolin (ANCEF) IVPB 1 g/50 mL premix       Note to Pharmacy: Send with pt to OR   1 g 100 mL/hr over 30 Minutes Intravenous On call 02/10/23 0908 02/10/23 0904   01/31/23 1700  ceFEPIme (MAXIPIME) 2 g in sodium chloride 0.9 % 100 mL IVPB  Status:  Discontinued        2 g 200 mL/hr over 30 Minutes Intravenous Every 12 hours 01/31/23 1555 02/03/23 1048   01/30/23 1700  vancomycin (VANCOREADY) IVPB 1250 mg/250 mL  Status:  Discontinued        1,250 mg 166.7 mL/hr over 90 Minutes Intravenous Every 24 hours 01/29/23 1543 01/30/23 1036    01/30/23 1300  Ampicillin-Sulbactam (UNASYN) 3 g in sodium chloride 0.9 % 100 mL IVPB  Status:  Discontinued        3 g 200 mL/hr over 30 Minutes Intravenous Every 8 hours 01/30/23 1036 01/31/23 1118   01/30/23 0000  vancomycin variable dose per unstable renal function (pharmacist dosing)  Status:  Discontinued         Does not apply See admin instructions  01/29/23 1125 01/29/23 1548   01/29/23 2200  piperacillin-tazobactam (ZOSYN) IVPB 3.375 g  Status:  Discontinued        3.375 g 12.5 mL/hr over 240 Minutes Intravenous Every 6 hours 01/29/23 1547 01/29/23 1548   01/29/23 2200  piperacillin-tazobactam (ZOSYN) IVPB 3.375 g  Status:  Discontinued        3.375 g 100 mL/hr over 30 Minutes Intravenous Every 6 hours 01/29/23 1548 01/30/23 1036   01/29/23 2100  piperacillin-tazobactam (ZOSYN) IVPB 2.25 g  Status:  Discontinued        2.25 g 100 mL/hr over 30 Minutes Intravenous Every 8 hours 01/29/23 1125 01/29/23 1547   01/29/23 1115  piperacillin-tazobactam (ZOSYN) IVPB 3.375 g        3.375 g 100 mL/hr over 30 Minutes Intravenous  Once 01/29/23 1103 01/29/23 1457   01/29/23 1115  vancomycin (VANCOREADY) IVPB 2000 mg/400 mL        2,000 mg 200 mL/hr over 120 Minutes Intravenous  Once 01/29/23 1104 01/29/23 1656           Data Reviewed:  I have personally reviewed the following...  CBC: Recent Labs  Lab 02/16/23 0403 02/17/23 0542 02/18/23 0730 02/19/23 0602 02/19/23 0840 02/19/23 1734 02/19/23 2325 02/20/23 0553 02/20/23 0646  WBC 9.2 7.7 6.1 9.1  --  16.0*  --   --  33.1*  NEUTROABS 7.6 6.2 4.9 7.2  --  12.4*  --   --   --   HGB 11.6* 11.8* 11.9* 9.3* 8.3* 7.6* 9.6* 8.3* 8.2*  HCT 37.1* 38.1* 37.5* 30.0* 25.7* 25.2* 30.0* 25.6* 25.4*  MCV 103.3* 103.3* 101.9* 103.1*  --  98.1  --   --  93.4  PLT 186 197 184 192  --  129*  --   --  [id number]*   Basic Metabolic Panel: Recent Labs  Lab 02/16/23 0403 02/17/23 0542 02/17/23 0543 02/18/23 0730 02/18/23 0731 02/19/23 0602  02/19/23 0604 02/19/23 1734 02/20/23 0553 02/20/23 0646  NA 137  --  139  --  135 139  --  139 138 139  K 3.7  --  3.7  --  3.9 5.1  --  5.1 6.6* 6.3*  CL 99  --  101  --  101 102  --  107 106 106  CO2 29  --  31  --  31 32  --  '25 25 25  '$ GLUCOSE 113*  --  105*  --  97 117*  --  168* 130* 139*  BUN 60*  --  51*  --  51* 70*  --  71* 82* 81*  CREATININE 2.45*  --  2.30*  --  2.11* 2.35*  --  2.33* 2.91* 2.82*  CALCIUM 9.1  --  9.1  --  9.1 8.7*  --  7.6* 8.3* 8.2*  MG 2.3 2.2  --  2.1  --  2.1  --   --  1.8  --   PHOS 3.2  --  2.9  --  3.3  --  2.4*  --  4.5  --    GFR: Estimated Creatinine Clearance: 23.4 mL/min (A) (by C-G formula based on SCr of 2.82 mg/dL (H)). Liver Function Tests: Recent Labs  Lab 02/17/23 0543 02/18/23 0731 02/19/23 0602 02/19/23 1734 02/20/23 0553  AST  --   --  32 35  --   ALT  --   --  24 22  --   ALKPHOS  --   --  97 64  --  BILITOT  --   --  0.8 1.5*  --   PROT  --   --  5.1* 4.1*  --   ALBUMIN 2.5* 2.5* 2.2* 2.2* 2.2*   No results for input(s): "LIPASE", "AMYLASE" in the last 168 hours. No results for input(s): "AMMONIA" in the last 168 hours. Coagulation Profile: Recent Labs  Lab 02/19/23 0602  INR 1.2   Cardiac Enzymes: No results for input(s): "CKTOTAL", "CKMB", "CKMBINDEX", "TROPONINI" in the last 168 hours. BNP (last 3 results) No results for input(s): "PROBNP" in the last 8760 hours. HbA1C: No results for input(s): "HGBA1C" in the last 72 hours. CBG: Recent Labs  Lab 02/19/23 0952  GLUCAP 76   Lipid Profile: No results for input(s): "CHOL", "HDL", "LDLCALC", "TRIG", "CHOLHDL", "LDLDIRECT" in the last 72 hours. Thyroid Function Tests: No results for input(s): "TSH", "T4TOTAL", "FREET4", "T3FREE", "THYROIDAB" in the last 72 hours. Anemia Panel: No results for input(s): "VITAMINB12", "FOLATE", "FERRITIN", "TIBC", "IRON", "RETICCTPCT" in the last 72 hours. Most Recent Urinalysis On File:     Component Value Date/Time    COLORURINE YELLOW (A) 01/29/2023 2227   APPEARANCEUR HAZY (A) 01/29/2023 2227   LABSPEC 1.014 01/29/2023 2227   PHURINE 5.0 01/29/2023 2227   GLUCOSEU NEGATIVE 01/29/2023 2227   HGBUR MODERATE (A) 01/29/2023 2227   BILIRUBINUR NEGATIVE 01/29/2023 2227   KETONESUR 5 (A) 01/29/2023 2227   PROTEINUR NEGATIVE 01/29/2023 2227   NITRITE NEGATIVE 01/29/2023 2227   LEUKOCYTESUR TRACE (A) 01/29/2023 2227   Sepsis Labs: '@LABRCNTIP'$ (procalcitonin:4,lacticidven:4) Microbiology: No results found for this or any previous visit (from the past 240 hour(s)).    Radiology Studies last 3 days: CT ANGIO GI BLEED  Result Date: 02/19/2023 CLINICAL DATA:  Bloody bowel movement. Concern for active gastrointestinal bleeding. EXAM: CTA ABDOMEN AND PELVIS WITHOUT AND WITH CONTRAST TECHNIQUE: Multidetector CT imaging of the abdomen and pelvis was performed using the standard protocol during bolus administration of intravenous contrast. Multiplanar reconstructed images and MIPs were obtained and reviewed to evaluate the vascular anatomy. RADIATION DOSE REDUCTION: This exam was performed according to the departmental dose-optimization program which includes automated exposure control, adjustment of the mA and/or kV according to patient size and/or use of iterative reconstruction technique. CONTRAST:  10m OMNIPAQUE IOHEXOL 350 MG/ML SOLN COMPARISON:  None Available. FINDINGS: VASCULAR Aorta: Atherosclerotic calcification of the aorta. Celiac: Patent without evidence of aneurysm, dissection, vasculitis or significant stenosis. SMA: Patent without evidence of aneurysm, dissection, vasculitis or significant stenosis. Renals: Both renal arteries are patent without evidence of aneurysm, dissection, vasculitis, fibromuscular dysplasia or significant stenosis. IMA: Patent without evidence of aneurysm, dissection, vasculitis or significant stenosis. Inflow: Patent without evidence of aneurysm, dissection, vasculitis or significant  stenosis. Proximal Outflow: Aortic calcification. Atherosclerotic calcification. No aneurysm. Veins: No obvious venous abnormality within the limitations of this arterial phase study. Review of the MIP images confirms the above findings. NON-VASCULAR Lower chest: Lung bases are clear. Hepatobiliary: No focal hepatic lesion. Normal gallbladder. No biliary duct dilatation. Common bile duct is normal. Pancreas: Pancreas is normal. No ductal dilatation. No pancreatic inflammation. Spleen: Normal spleen Adrenals/urinary tract: Adrenal glands and kidneys are normal. The ureters and bladder normal. Stomach/Bowel: Stomach is normal. On delayed portal venous phase imaging there is a collection of endoluminal contrast within the second portion the duodenum (image 32/11). Findings consistent with active CT chest of bleeding. On arterial phase imaging there is a tiny focus of intraluminal contrast at this site (image 67/5). No evidence of active gastrointestinal bleeding within the small  bowel. No small bowel inflammation or dilatation. There is high-density material within the colon. No evidence of active extravasation IV contrast into the lumen of the LEFT or RIGHT colon. Rectosigmoid colon likewise unremarkable except for several diverticula. Rectum unremarkable. Vascular/Lymphatic: Abdominal aorta is normal caliber with atherosclerotic calcification. There is no retroperitoneal or periportal lymphadenopathy. No pelvic lymphadenopathy. Reproductive: Prostate unremarkable Other: Mild sarcoma of the soft tissues. Musculoskeletal: Degenerative osteophytosis of the spine. IMPRESSION: VASCULAR 1. Active gastrointestinal bleeding in the second portion duodenum evident on the portal venous phase imaging. 2. Atherosclerotic calcification of the aorta is branches without high-grade stenosis. NON-VASCULAR 1. No evidence of bowel obstruction or inflammation. These results will be called to the ordering clinician or representative by  the Radiologist Assistant, and communication documented in the PACS or [employer]. Electronically Signed   By: Suzy Bouchard M.D.   On: 02/19/2023 13:59   PERIPHERAL VASCULAR CATHETERIZATION  Result Date: 02/17/2023 See surgical note for result.            LOS: 22 days    Time spent: 50 min    Emeterio Reeve, DO [facility] 02/20/2023, 9:40 AM    Dictation software may have been used to generate the above note. Typos may occur and escape review in typed/dictated notes. Please contact Dr Sheppard Coil directly for clarity if needed.  Staff may message me via secure chat in [place]  but this may not receive an immediate response,  please page me for urgent matters!  If 7PM-7AM, please contact night coverage [email or web address]

## 2023-02-20 NOTE — Progress Notes (Signed)
NAME:  Timotheus Amour, MRN:  [id number], DOB:  July 21, 1946, LOS: 90 ADMISSION DATE:  01/29/2023, CONSULTATION DATE:  01/29/2023 REFERRING MD:  Dr. Cheri Fowler,  CHIEF COMPLAINT:  Altered Mental Status, worsening renal function   Brief Pt Description / Synopsis:  77 y.o. male admitted with shock (? Cardiogenic vs ? Septic), liver cirrhosis  Acute Hypoxic Respiratory Failure in the setting of Acute Decompensated HFpEF, and AKI on CKD Stage IV requiring intubation and mechanical ventilation, along with initiation of CRRT. Extubated, then developed acute GIB duodenal ulcers post op resp failure with severe hypovolumic shock associated with decompensated heart failure  History of Present Illness:  This is 77 year old male with a history of essential hypertension, on hydralazine and Cozaar, stage IV CKD but has not been on dialysis in the past, has chronic diastolic and systolic heart failure with cor pulmonale, moderate tricuspid regurgitation and aortic valve regurgitation, history of a ascending aortic aneurysm, coronary artery disease with atherosclerosis, COPD and chronic hypoxemia baseline 2 L/min supplemental oxygen at home with recurrent episodes of hypoxemia and remote acute exacerbation of COPD with hypoxemia and hypercapnia 2 months ago history of ruptured left quadricep tendon 2 years ago, chronic thrombocytopenia and erythrocytosis recurrent lower extremity cellulitis, recurrent hyperkalemia, morbid obesity history of anasarca dyslipidemia, recent admission for acute CHF exacerbation requiring Lasix drip and BiPAP support, who was brought in by EMS due to worsening altered mental status and circulatory shock. I was able to meet with wife at bedside during my evaluation who shares patient has been sleeping majority of each day over the last week has been on arousable and received phone call from medical provider regarding worsening kidney function which prompted ER evaluation. He had chest x-ray performed  while in the ER showing bilateral pleural effusions with vascular congestion and atelectasis/infiltrate. Patient in circulatory shock with Levophed support on admission in ER. PCCM consultation for admission to medical intensive care unit with circulatory shock consistent with acute on chronic renal failure with CHF exacerbation.    Pertinent  Medical History   Past Medical History:  Diagnosis Date   CHF (congestive heart failure) (HCC)    EF 45% in 2021   Chronic kidney disease    COPD (chronic obstructive pulmonary disease) ([place]) 02/16/2020   Hyperlipidemia    Hypertension    Pneumonia 2021    Micro Data:  2/14: Tracheal aspirate>> normal respiratory Flora 2/14: Blood culture x2>> negative 2/14: MRSA PCR>>negative 2/14: Strep pneumo & Legionella urinary antigens>> negative  Antimicrobials:   Anti-infectives (From admission, onward)    Start     Dose/Rate Route Frequency Ordered Stop   02/19/23 0645  cefTRIAXone (ROCEPHIN) 1 g in sodium chloride 0.9 % 100 mL IVPB        1 g 200 mL/hr over 30 Minutes Intravenous Every 24 hours 02/19/23 0556 02/26/23 0644   02/11/23 0000  ceFAZolin (ANCEF) IVPB 1 g/50 mL premix       Note to Pharmacy: Send with pt to OR   1 g 100 mL/hr over 30 Minutes Intravenous On call 02/10/23 0908 02/10/23 0904   01/31/23 1700  ceFEPIme (MAXIPIME) 2 g in sodium chloride 0.9 % 100 mL IVPB  Status:  Discontinued        2 g 200 mL/hr over 30 Minutes Intravenous Every 12 hours 01/31/23 1555 02/03/23 1048   01/30/23 1700  vancomycin (VANCOREADY) IVPB 1250 mg/250 mL  Status:  Discontinued        1,250 mg 166.7 mL/hr over 90  Minutes Intravenous Every 24 hours 01/29/23 1543 01/30/23 1036   01/30/23 1300  Ampicillin-Sulbactam (UNASYN) 3 g in sodium chloride 0.9 % 100 mL IVPB  Status:  Discontinued        3 g 200 mL/hr over 30 Minutes Intravenous Every 8 hours 01/30/23 1036 01/31/23 1118   01/30/23 0000  vancomycin variable dose per unstable renal function  (pharmacist dosing)  Status:  Discontinued         Does not apply See admin instructions 01/29/23 1125 01/29/23 1548   01/29/23 2200  piperacillin-tazobactam (ZOSYN) IVPB 3.375 g  Status:  Discontinued        3.375 g 12.5 mL/hr over 240 Minutes Intravenous Every 6 hours 01/29/23 1547 01/29/23 1548   01/29/23 2200  piperacillin-tazobactam (ZOSYN) IVPB 3.375 g  Status:  Discontinued        3.375 g 100 mL/hr over 30 Minutes Intravenous Every 6 hours 01/29/23 1548 01/30/23 1036   01/29/23 2100  piperacillin-tazobactam (ZOSYN) IVPB 2.25 g  Status:  Discontinued        2.25 g 100 mL/hr over 30 Minutes Intravenous Every 8 hours 01/29/23 1125 01/29/23 1547   01/29/23 1115  piperacillin-tazobactam (ZOSYN) IVPB 3.375 g        3.375 g 100 mL/hr over 30 Minutes Intravenous  Once 01/29/23 1103 01/29/23 1457   01/29/23 1115  vancomycin (VANCOREADY) IVPB 2000 mg/400 mL        2,000 mg 200 mL/hr over 120 Minutes Intravenous  Once 01/29/23 1104 01/29/23 1656        Significant Hospital Events: Including procedures, antibiotic start and stop dates in addition to other pertinent events   01/30/23- I met with girlfriend of patient today to review short term medical plan.  He had Echo whith RV dysfunction and concern for PE.  We have started CRRT yesterday with additional interval improvement. He will have CTPE today.  He is weaned down on pressors today on vasor 0.4 and 4 of levophed reduced from 36 this morning.  01/31/23- remains agitated intermittently overnight. Continued issues with clot burden through CRRT circuit. 02/01/23- remains agitated overnight. Requiring numerous ativan pushes. Attempting to wean down sedation. Failed SBT miserably. 02/02/23- issues with CRRT overnight. Will attempt higher UF today. Assess for possible chest tube placement.  02/03/23- No issues with CRRT overnight, currently running without complication.  CT Head obtained overnight and negative for acute process.  Off Precedex this  morning, currently somnolent, mental status currently precluding extubation. Will perform SBT as mental status permits.  Stop Cefepime, checking ammonia. 02/04/23- Remains on CRRT. Pt more awake and tracking.  Tolerated SBT for 12 hrs  02/05/23- Pt remains mechanically intubated on minimal vent settings.  Pt able to track and open eyes on command, however unable to move BLE and BLE  02/06/23-Off vasopressors.  Palliative Care consulted pt stating he DOES NOT want any form of dialysis.  If pt remains stable will transfer service to [facility]  2/26 PLaced PERM CATH for HD 2/27-status post hemodialysis today.  Doing reasonably well.  Mental status improved.  Is pretty clear with [place] that he does not want hemodialysis.  2/28 -patient has gone back and forth about wanting dialysis and not wanting dialysis.  He told the palliative team this morning that he did not want any further dialysis with him: Back to room states that he wants everything done now.  Nephrology is going to hold dialysis today and tomorrow and evaluate for Friday to see if he needs it  given that he had dialysis in the last 2 days.  The dialysis catheter was placed 2 days ago is working well and patient is healing recovering as expected per the vascular team and they are recommending outpatient follow-up. 2/29: Dialysis held today and yesterday.  Monitoring for renal recovery.  Creatinine started to rise.  Nephrology starting Lasix 80 mg IV.  3/4 removal or PERM CATH He made decision for no dialysis and as such dialysis catheter was removed  3/6 hypotension and another large hematochezia episode 3/6 EGD with bleeding ulcers, injections and clips placed POST OP RESP FAILURE AND SEVERE SHOCK ,TRANSFERRED TO ICU FOR HYPOVOLUMIC SHOCK 3/6 TRH discussion with patient-He reiterates NO CPR and if there is no reasonable chance of his coming off life support he would like to be made comfortable and pass peacefully  3/7 extubated, placed on pressors for  shock     Interim History / Subjective:   severe shock on pressors +GIB Multiorgan failure Prognosis for meaningful recovery is minimal Recommend comfort care    Objective   Blood pressure (!) 78/57, pulse (!) 32, temperature 98.1 F (36.7 C), temperature source Axillary, resp. rate 20, height '5\' 11"'$  (1.803 m), weight 82.1 kg, SpO2 100 %.    Vent Mode: PRVC FiO2 (%):  [100 %] 100 % Set Rate:  [20 bmp] 20 bmp Vt Set:  [500 mL] 500 mL PEEP:  [5 cmH20] 5 cmH20   Intake/Output Summary (Last 24 hours) at 02/20/2023 0713 Last data filed at 02/20/2023 0500 Gross per 24 hour  Intake 2781.13 ml  Output --  Net 2781.13 ml    Filed Weights   02/19/23 0350 02/19/23 0802 02/20/23 0500  Weight: 75.5 kg 79.5 kg 82.1 kg     REVIEW OF SYSTEMS  PATIENT IS UNABLE TO PROVIDE COMPLETE REVIEW OF SYSTEMS DUE TO SEVERE CRITICAL ILLNESS   PHYSICAL EXAMINATION:  GENERAL:critically ill appearing, EYES: Pupils equal, round, reactive to light.  No scleral icterus.  MOUTH: Moist mucosal membrane. NECK: Supple.  PULMONARY: Lungs clear to auscultation, +rhonchi, +wheezing CARDIOVASCULAR: S1 and S2.  Regular rate and rhythm GASTROINTESTINAL: Soft, nontender, -distended. Positive bowel sounds.  MUSCULOSKELETAL: No swelling, clubbing, or edema.  NEUROLOGIC: obtunded SKIN:normal, warm to touch, Capillary refill delayed  Pulses present bilaterally    Assessment & Plan:  Circulatory shock  massive GIB s/p EGD in setting of LIVER CIRRHOSIS Acute decompensated HFpEF and right-sided heart failure    Acute Hypoxic Respiratory Failure needing vent support Acute kidney injury on chronic kidney disease stage IV with  Pulmonary Hypertension, & small bilateral Pleural Effusions      Acute Decompensated HFpEF & Right sided HF Hypotension likely now secondary to sedating medication/GIB Moderate Tricuspid & Aortic Regurgitation Elevated Troponin in setting of  demand ischemia Echocardiogram 01/30/23:  LVEF 50055%, Grade I DD, severely reduced RV systolic function, mild to moderate TR & AR - Vasopressors as needed to maintain MAP >65    ACUTE KIDNEY INJURY/Renal Failure -continue Foley Catheter-assess need -Avoid nephrotoxic agents -Follow urine output, BMP -Ensure adequate renal perfusion, optimize oxygenation -Renal dose medications PATIENT HAS REFUSED HD  Intake/Output Summary (Last 24 hours) at 02/20/2023 0715 Last data filed at 02/20/2023 0500 Gross per 24 hour  Intake 2781.13 ml  Output --  Net 2781.13 ml     NEUROLOGY ACUTE METABOLIC ENCEPHALOPATHY  h/o Acute metabolic encephalopathy vs possible hepatic encephalopathy Prognosis is poor   ENDO - ICU hypoglycemic\Hyperglycemia protocol -check FSBS per protocol   GI GI PROPHYLAXIS  as indicated  NUTRITIONAL STATUS DIET-->NPO Constipation protocol as indicated   ELECTROLYTES -follow labs as needed -replace as needed -pharmacy consultation and following      Diet/type: tubefeeds and NPO DVT prophylaxis: SCD GI prophylaxis: PPI Foley:  Yes, and it is still needed Code Status: DNR Last date of multidisciplinary goals of care discussion ALL DECISION TO BE MADE BY ATTENDING PHSYCIANS  CBC: Recent Labs  Lab 02/16/23 0403 02/17/23 0542 02/18/23 0730 02/19/23 0602 02/19/23 0840 02/19/23 1734 02/19/23 2325 02/20/23 0553 02/20/23 0646  WBC 9.2 7.7 6.1 9.1  --  16.0*  --   --  33.1*  NEUTROABS 7.6 6.2 4.9 7.2  --  12.4*  --   --   --   HGB 11.6* 11.8* 11.9* 9.3* 8.3* 7.6* 9.6* 8.3* 8.2*  HCT 37.1* 38.1* 37.5* 30.0* 25.7* 25.2* 30.0* 25.6* 25.4*  MCV 103.3* 103.3* 101.9* 103.1*  --  98.1  --   --  93.4  PLT 186 197 184 192  --  129*  --   --  129*     Basic Metabolic Panel: Recent Labs  Lab 02/16/23 0403 02/17/23 0542 02/17/23 0543 02/18/23 0730 02/18/23 0731 02/19/23 0602 02/19/23 0604 02/19/23 1734 02/20/23 0553  NA 137  --  139  --  135 139  --  139 138  K 3.7  --  3.7  --  3.9 5.1  --   5.1 6.6*  CL 99  --  101  --  101 102  --  107 106  CO2 29  --  31  --  31 32  --  25 25  GLUCOSE 113*  --  105*  --  97 117*  --  168* 130*  BUN 60*  --  51*  --  51* 70*  --  71* 82*  CREATININE 2.45*  --  2.30*  --  2.11* 2.35*  --  2.33* 2.91*  CALCIUM 9.1  --  9.1  --  9.1 8.7*  --  7.6* 8.3*  MG 2.3 2.2  --  2.1  --  2.1  --   --  1.8  PHOS 3.2  --  2.9  --  3.3  --  2.4*  --  4.5    GFR: Estimated Creatinine Clearance: 22.6 mL/min (A) (by C-G formula based on SCr of 2.91 mg/dL (H)). Recent Labs  Lab 02/18/23 0730 02/19/23 0602 02/19/23 1734 02/20/23 0646  WBC 6.1 9.1 16.0* 33.1*     Liver Function Tests: Recent Labs  Lab 02/17/23 0543 02/18/23 0731 02/19/23 0602 02/19/23 1734 02/20/23 0553  AST  --   --  32 35  --   ALT  --   --  24 22  --   ALKPHOS  --   --  97 64  --   BILITOT  --   --  0.8 1.5*  --   PROT  --   --  5.1* 4.1*  --   ALBUMIN 2.5* 2.5* 2.2* 2.2* 2.2*    No results for input(s): "LIPASE", "AMYLASE" in the last 168 hours. No results for input(s): "AMMONIA" in the last 168 hours.   ABG    Component Value Date/Time   PHART 7.35 02/03/2023 1047   PCO2ART 49 (H) 02/03/2023 1047   PO2ART 91 02/03/2023 1047   HCO3 15.7 (L) 02/20/2023 0500   TCO2 31 02/15/2020 1034   ACIDBASEDEF 10.4 (H) 02/20/2023 0500   O2SAT 77.3 02/20/2023 0500     Coagulation  Profile: Recent Labs  Lab 02/19/23 0602  INR 1.2     Cardiac Enzymes: No results for input(s): "CKTOTAL", "CKMB", "CKMBINDEX", "TROPONINI" in the last 168 hours.  HbA1C: Hgb A1c MFr Bld  Date/Time Value Ref Range Status  10/02/2022 11:16 PM 6.3 (H) 4.8 - 5.6 % Final    Comment:    (NOTE) Pre diabetes:          5.7%-6.4%  Diabetes:              >6.4%  Glycemic control for   <7.0% adults with diabetes     CBG: Recent Labs  Lab 02/19/23 0952  GLUCAP 76     PROGNOSIS IS POOR HIGH CHANCE OF CARDIAC ARREST    DVT/GI PRX  assessed I Assessed the need for Labs I Assessed  the need for Foley I Assessed the need for Central Venous Line Family Discussion when available I Assessed the need for Mobilization I made an Assessment of medications to be adjusted accordingly Safety Risk assessment completed  CASE DISCUSSED IN MULTIDISCIPLINARY ROUNDS WITH ICU TEAM     Critical Care Time devoted to patient care services described in this note is 55 minutes.  Critical care was necessary to treat /prevent imminent and life-threatening deterioration. Overall, patient is critically ill, prognosis is guarded.  Patient with Multiorgan failure and at high risk for cardiac arrest and death.    Corrin Parker, M.D.  [facility]  Medical Director [place] Director [facility] Cardio-Pulmonary Department

## 2023-02-20 NOTE — Progress Notes (Signed)
OT Cancellation Note  Patient Details Name: John [name] MRN: [id number] DOB: 02-23-1946   Cancelled Treatment:    Reason Eval/Treat Not Completed: Medical issues which prohibited therapy. Pt with significant change in medical status and now intubated in CCU. OT to complete orders at this time. Please re-consult when appropriate.   Darleen Crocker, MS, OTR/L , CBIS ascom [phone]  02/20/23, 8:46 AM

## 2023-02-20 NOTE — Progress Notes (Signed)
Daily Progress Note   Patient Name: John [name]       Date: 02/20/2023 DOB: [birth year]  Age: 77 y.o. MRN#: [id number] Attending Physician: Emeterio Reeve, DO Primary Care Physician: Alisa Graff, FNP Admit Date: 01/29/2023  Reason for Consultation/Follow-up: Establishing goals of care  HPI/Brief Hospital Review: 77 year old male with a history of HTN, stage IV CKD-has not been on dialysis prior to admission, chronic diastolic and systolic heart failure with cor pulmonale, moderate tricuspid regurgitation and aortic valve regurgitation, history of ascending aortic aneurysm, CAD with atherosclerosis, COPD and chronic hypoxemia baseline 2 L/min supplemental oxygen at home, chronic thrombocytopenia and erythrocytosis, recurrent lower extremity cellulitis, recurrent hyperkalemia with a recent admission for acute CHF exacerbation requiring Lasix drip and BiPAP support, who was brought in by EMS on 01/29/2023 due to worsening altered mental status and circulatory shock.     Required intubation, CRRT and pressor support this admission. Successfully extubated 2/21.  Has required CRRT/HD this admission   3/6 Noted acute GI bleed Blood transfusions Hypotensive  3/7 Ongoing blood loss-stable hemoglobin Hypotensive-on levophed and vasopressin Hyperkalemic at 6.3   Palliative Medicine consulted for assisting with goals of care conversations.  Subjective: Extensive chart review has been completed prior to meeting patient including labs, vital signs, imaging, progress notes, orders, and available advanced directive documents from current and previous encounters.    Visited with John [name] at his bedside. Awake and alert, able to communicate but question capacity to understand and process  complex medical decisions. Attempted to elicit goals of care. John [name] appears frustrated and states "I do not understand why we keep discussing this." When further attempts were made for him to share his wishes and desires he was unable to clearly articulate his desires.  He shares he does want Nancy-girlfriend to be provided medical updates today. Attempted to call [name], left VM.  With ongoing fluctuations in mentation, recommended seeking guardianship. On numerous occasions, [name] has declined assuming role of HCPOA.  John [name] denies acute pain or discomfort, N/V. Low symptom burden.  Later, has shared with nephrology team he wishes to pursue aggressive medical interventions including dialysis (CRRT) if needed.  PMT to follow peripherally for ongoing needs and support.  Objective:  Physical Exam Constitutional:      General: He is not in acute distress.  Appearance: He is ill-appearing.  Pulmonary:     Effort: Pulmonary effort is normal. No respiratory distress.  Skin:    General: Skin is warm and dry.  Neurological:     Mental Status: He is alert.             Vital Signs: BP (!) 95/55   Pulse (!) 101   Temp 97.7 F (36.5 C) (Oral)   Resp (!) 21   Ht '5\' 11"'$  (1.803 m)   Wt 82.1 kg   SpO2 91%   BMI 25.24 kg/m  SpO2: SpO2: 91 % O2 Device: O2 Device: High Flow Nasal Cannula O2 Flow Rate: O2 Flow Rate (L/min): (S) 10 L/min   Palliative Care Assessment & Plan   Assessment/Recommendation/Plan  DNR/DNI Desires aggressive interventions Recommend pursuing guardianship PMT to follow peripherally for ongoing needs and support  Care plan was discussed with nursing staff, primary and CCM team.  Thank you for allowing the Palliative Medicine Team to assist in the care of this patient.  Total time:  25 minutes  Time spent includes: Detailed review of medical records (labs, imaging, vital signs), medically appropriate exam (mental status, respiratory, cardiac,  skin), discussed with treatment team, counseling and educating patient, family and staff, documenting clinical information, medication management and coordination of care.  Theodoro Grist, DNP, AGNP-C Palliative Medicine   Please contact Palliative Medicine Team phone at [phone] for questions and concerns.

## 2023-02-20 NOTE — Procedures (Signed)
Central Venous Catheter Insertion Procedure Note  Verl Vandervelde  [id number]  1946-04-09  Date:02/20/23  Time:2:51 AM   Provider Performing:Makenleigh Crownover L Rust[place]ster   Procedure: Insertion of Non-tunneled Central Venous Catheter(36556) with US guidance [id number])   Indication(s) Medication administration  Consent Unable to obtain consent due to emergent nature of procedure.  Anesthesia Topical only with 1% lidocaine   Timeout Verified patient identification, verified procedure, site/side was marked, verified correct patient position, special equipment/implants available, medications/allergies/relevant history reviewed, required imaging and test results available.  Sterile Technique Maximal sterile technique including full sterile barrier drape, hand hygiene, sterile gown, sterile gloves, mask, hair covering, sterile ultrasound probe cover (if used).  Procedure Description Area of catheter insertion was cleaned with chlorhexidine and draped in sterile fashion.  With real-time ultrasound guidance a central venous catheter was placed into the right femoral vein. Nonpulsatile blood flow and easy flushing noted in all ports.  The catheter was sutured in place and sterile dressing applied.  Complications/Tolerance None; patient tolerated the procedure well. Chest X-ray is ordered to verify placement for internal jugular or subclavian cannulation.   Chest x-ray is not ordered for femoral cannulation.  EBL Minimal  Specimen(s) None  Venetia Night, AGACNP-BC Acute Care Nurse Practitioner [facility]   [phone] / [phone] Please see Amion for pager details.

## 2023-02-20 NOTE — Procedures (Signed)
Central Venous Catheter Insertion Procedure Note  John [name]  [id number]  05-01-1946  Date:02/20/23  Time:4:24 PM   Provider Performing:Brady Schiller Micheline Chapman   Procedure: Insertion of Non-tunneled Central Venous Catheter(36556)with US guidance [id number])    Indication(s) Hemodialysis  Consent Unable to obtain consent due to emergent nature of procedure.  Anesthesia Topical only with 1% lidocaine   Timeout Verified patient identification, verified procedure, site/side was marked, verified correct patient position, special equipment/implants available, medications/allergies/relevant history reviewed, required imaging and test results available.  Sterile Technique Maximal sterile technique including full sterile barrier drape, hand hygiene, sterile gown, sterile gloves, mask, hair covering, sterile ultrasound probe cover (if used).  Procedure Description Area of catheter insertion was cleaned with chlorhexidine and draped in sterile fashion.   With real-time ultrasound guidance a HD catheter was placed into the left femoral vein.  Nonpulsatile blood flow and easy flushing noted in all ports.  The catheter was sutured in place and sterile dressing applied.  Complications/Tolerance None; patient tolerated the procedure well. Chest X-ray is ordered to verify placement for internal jugular or subclavian cannulation.  Chest x-ray is not ordered for femoral cannulation.  EBL Minimal  Specimen(s) None  Donell Beers, AGNP  Pulmonary/Critical Care Pager [phone] (please enter 7 digits) PCCM Consult Pager [phone] (please enter 7 digits)

## 2023-02-20 NOTE — Progress Notes (Signed)
PT Cancellation Note  Patient Details Name: John [name] MRN: [id number] DOB: 07-21-1946   Cancelled Treatment:    Reason Eval/Treat Not Completed: Other (comment) Pt was intubated and transferred to the CCU.  Due to change in status and medical decline will complete PT orders at this time.  Should pt become appropriate for and need PT please place new PT orders.  Kreg Shropshire, DPT 02/20/2023, 7:50 AM

## 2023-02-20 NOTE — Progress Notes (Signed)
[facility] Inpatient Progress Note    Subjective: Patient seen for followup bleeding duodenal ulcer. Patient resting, gf at bedside [name]). Discussed with RN, Ovid Curd, reportedly patient had two maroon stools within the past 12-16 hours. Still on pressors. NPO getting some sips of water. Appears patient seen by palliative care without garnering any additional patient clarity on his wishes or healthcare expectations.  Objective: Vital signs in last 24 hours: Temp:  [96.3 F (35.7 C)-98.5 F (36.9 C)] 97.8 F (36.6 C) (03/07 1200) Pulse Rate:  [25-110] 88 (03/07 1100) Resp:  [13-35] 17 (03/07 1200) BP: (61-186)/(7-148) 111/69 (03/07 1200) SpO2:  [3 %-100 %] 100 % (03/07 1200) FiO2 (%):  [100 %] 100 % (03/06 1650) Weight:  [82.1 kg] 82.1 kg (03/07 0500) Blood pressure 111/69, pulse 88, temperature 97.8 F (36.6 C), temperature source Oral, resp. rate 17, height '5\' 11"'$  (1.803 m), weight 82.1 kg, SpO2 100 %.    Intake/Output from previous day: 03/06 0701 - 03/07 0700 In: 3066.2 [I.V.:834; Blood:1806.7; IV Piggyback:425.5] Out: -   Intake/Output this shift: Total I/O In: 1333.9 [I.V.:275.9; IV Piggyback:1057.9] Out: 100 [Urine:100]   Gen: NAD.Extubated. Sleeping comfortably.  HEENT: [place]/AT. PERRLA. Normal external ear exam.  Chest: CTA, no wheezes.  CV: RR nl S1, S2. No gallops.  Abd: soft, nt, nd. BS+  Ext: no edema. Pulses 2+  Neuro: Alert and oriented. Judgement appears normal. Nonfocal.   Lab Results: Results for orders placed or performed during the hospital encounter of 01/29/23 (from the past 24 hour(s))  Prepare RBC (crossmatch)     Status: None   Collection Time: 02/19/23  4:30 PM  Result Value Ref Range   Order Confirmation      ORDER PROCESSED BY BLOOD BANK Performed at [facility], [address] [place], [place] [address]   CBC with Differential/Platelet     Status: Abnormal   Collection Time: 02/19/23  5:34 PM   Result Value Ref Range   WBC 16.0 (H) 4.0 - 10.5 K/uL   RBC 2.57 (L) 4.22 - 5.81 MIL/uL   Hemoglobin 7.6 (L) 13.0 - 17.0 g/dL   HCT 25.2 (L) 39.0 - 52.0 %   MCV 98.1 80.0 - 100.0 fL   MCH 29.6 26.0 - 34.0 pg   MCHC 30.2 30.0 - 36.0 g/dL   RDW 20.1 (H) 11.5 - 15.5 %   Platelets 129 (L) 150 - 400 K/uL   nRBC 0.0 0.0 - 0.2 %   Neutrophils Relative % 78 %   Neutro Abs 12.4 (H) 1.7 - 7.7 K/uL   Lymphocytes Relative 8 %   Lymphs Abs 1.3 0.7 - 4.0 K/uL   Monocytes Relative 8 %   Monocytes Absolute 1.3 (H) 0.1 - 1.0 K/uL   Eosinophils Relative 0 %   Eosinophils Absolute 0.0 0.0 - 0.5 K/uL   Basophils Relative 0 %   Basophils Absolute 0.0 0.0 - 0.1 K/uL   Immature Granulocytes 6 %   Abs Immature Granulocytes 0.96 (H) 0.00 - 0.07 K/uL  Comprehensive metabolic panel     Status: Abnormal   Collection Time: 02/19/23  5:34 PM  Result Value Ref Range   Sodium 139 135 - 145 mmol/L   Potassium 5.1 3.5 - 5.1 mmol/L   Chloride 107 98 - 111 mmol/L   CO2 25 22 - 32 mmol/L   Glucose, Bld 168 (H) 70 - 99 mg/dL   BUN 71 (H) 8 - 23 mg/dL   Creatinine, Ser 2.33 (H) 0.61 -  1.24 mg/dL   Calcium 7.6 (L) 8.9 - 10.3 mg/dL   Total Protein 4.1 (L) 6.5 - 8.1 g/dL   Albumin 2.2 (L) 3.5 - 5.0 g/dL   AST 35 15 - 41 U/L   ALT 22 0 - 44 U/L   Alkaline Phosphatase 64 38 - 126 U/L   Total Bilirubin 1.5 (H) 0.3 - 1.2 mg/dL   GFR, Estimated 28 (L) >60 mL/min   Anion gap 7 5 - 15  Prepare RBC (crossmatch)     Status: None   Collection Time: 02/19/23  7:03 PM  Result Value Ref Range   Order Confirmation      DUPLICATE REQUEST BB SAMPLE OR UNITS ALREADY AVAILABLE Performed at [facility], [address] [place], [place] [address]   Hemoglobin and hematocrit, blood     Status: Abnormal   Collection Time: 02/19/23 11:25 PM  Result Value Ref Range   Hemoglobin 9.6 (L) 13.0 - 17.0 g/dL   HCT 30.0 (L) 39.0 - 52.0 %  Blood gas, venous     Status: Abnormal   Collection Time: 02/20/23  5:00 AM   Result Value Ref Range   pH, Ven 7.26 7.25 - 7.43   pCO2, Ven 35 (L) 44 - 60 mmHg   pO2, Ven 42 32 - 45 mmHg   Bicarbonate 15.7 (L) 20.0 - 28.0 mmol/L   Acid-base deficit 10.4 (H) 0.0 - 2.0 mmol/L   O2 Saturation 77.3 %   Patient temperature 37.0    Collection site VEIN   Magnesium     Status: None   Collection Time: 02/20/23  5:53 AM  Result Value Ref Range   Magnesium 1.8 1.7 - 2.4 mg/dL  Renal function panel     Status: Abnormal   Collection Time: 02/20/23  5:53 AM  Result Value Ref Range   Sodium 138 135 - 145 mmol/L   Potassium 6.6 (HH) 3.5 - 5.1 mmol/L   Chloride 106 98 - 111 mmol/L   CO2 25 22 - 32 mmol/L   Glucose, Bld 130 (H) 70 - 99 mg/dL   BUN 82 (H) 8 - 23 mg/dL   Creatinine, Ser 2.91 (H) 0.61 - 1.24 mg/dL   Calcium 8.3 (L) 8.9 - 10.3 mg/dL   Phosphorus 4.5 2.5 - 4.6 mg/dL   Albumin 2.2 (L) 3.5 - 5.0 g/dL   GFR, Estimated 22 (L) >60 mL/min   Anion gap 7 5 - 15  Hemoglobin and hematocrit, blood     Status: Abnormal   Collection Time: 02/20/23  5:53 AM  Result Value Ref Range   Hemoglobin 8.3 (L) 13.0 - 17.0 g/dL   HCT 25.6 (L) 39.0 - [id number] %  Basic metabolic panel     Status: Abnormal   Collection Time: 02/20/23  6:46 AM  Result Value Ref Range   Sodium 139 135 - 145 mmol/L   Potassium 6.3 (HH) 3.5 - 5.1 mmol/L   Chloride 106 98 - 111 mmol/L   CO2 25 22 - 32 mmol/L   Glucose, Bld 139 (H) 70 - 99 mg/dL   BUN 81 (H) 8 - 23 mg/dL   Creatinine, Ser 2.82 (H) 0.61 - 1.24 mg/dL   Calcium 8.2 (L) 8.9 - 10.3 mg/dL   GFR, Estimated 22 (L) >60 mL/min   Anion gap 8 5 - 15  CBC     Status: Abnormal   Collection Time: 02/20/23  6:46 AM  Result Value Ref Range   WBC 33.1 (H) 4.0 - 10.5  K/uL   RBC 2.72 (L) 4.22 - 5.81 MIL/uL   Hemoglobin 8.2 (L) 13.0 - 17.0 g/dL   HCT 25.4 (L) 39.0 - 52.0 %   MCV 93.4 80.0 - 100.0 fL   MCH 30.1 26.0 - 34.0 pg   MCHC 32.3 30.0 - 36.0 g/dL   RDW 18.6 (H) 11.5 - 15.5 %   Platelets 129 (L) 150 - 400 K/uL   nRBC 0.1 0.0 - 0.2 %   Blood gas, arterial     Status: Abnormal   Collection Time: 02/20/23 11:19 AM  Result Value Ref Range   O2 Content 10.0 L/min   Delivery systems HI FLOW NASAL CANNULA    pH, Arterial 7.27 (L) 7.35 - 7.45   pCO2 arterial 58 (H) 32 - 48 mmHg   pO2, Arterial 36 (LL) 83 - 108 mmHg   Bicarbonate 26.6 20.0 - 28.0 mmol/L   Acid-base deficit 1.4 0.0 - 2.0 mmol/L   O2 Saturation 54 %   Patient temperature 37.0    Collection site LEFT BRACHIAL      Recent Labs    02/19/23 0602 02/19/23 0840 02/19/23 1734 02/19/23 2325 02/20/23 0553 02/20/23 0646  WBC 9.1  --  16.0*  --   --  33.1*  HGB 9.3*   < > 7.6* 9.6* 8.3* 8.2*  HCT 30.0*   < > 25.2* 30.0* 25.6* 25.4*  PLT 192  --  129*  --   --  129*   < > = values in this interval not displayed.   BMET Recent Labs    02/19/23 1734 02/20/23 0553 02/20/23 0646  NA 139 138 139  K 5.1 6.6* 6.3*  CL 107 106 106  CO2 '25 25 25  '$ GLUCOSE 168* 130* 139*  BUN 71* 82* 81*  CREATININE 2.33* 2.91* 2.82*  CALCIUM 7.6* 8.3* 8.2*   LFT Recent Labs    02/19/23 1734 02/20/23 0553  PROT 4.1*  --   ALBUMIN 2.2* 2.2*  AST 35  --   ALT 22  --   ALKPHOS 64  --   BILITOT 1.5*  --    PT/INR Recent Labs    02/19/23 0602  LABPROT 14.8  INR 1.2   Hepatitis Panel No results for input(s): "HEPBSAG", "HCVAB", "HEPAIGM", "HEPBIGM" in the last 72 hours. C-Diff No results for input(s): "CDIFFTOX" in the last 72 hours. No results for input(s): "CDIFFPCR" in the last 72 hours.   Studies/Results: CT ANGIO GI BLEED  Result Date: 02/19/2023 CLINICAL DATA:  Bloody bowel movement. Concern for active gastrointestinal bleeding. EXAM: CTA ABDOMEN AND PELVIS WITHOUT AND WITH CONTRAST TECHNIQUE: Multidetector CT imaging of the abdomen and pelvis was performed using the standard protocol during bolus administration of intravenous contrast. Multiplanar reconstructed images and MIPs were obtained and reviewed to evaluate the vascular anatomy. RADIATION DOSE  REDUCTION: This exam was performed according to the departmental dose-optimization program which includes automated exposure control, adjustment of the mA and/or kV according to patient size and/or use of iterative reconstruction technique. CONTRAST:  82m OMNIPAQUE IOHEXOL 350 MG/ML SOLN COMPARISON:  None Available. FINDINGS: VASCULAR Aorta: Atherosclerotic calcification of the aorta. Celiac: Patent without evidence of aneurysm, dissection, vasculitis or significant stenosis. SMA: Patent without evidence of aneurysm, dissection, vasculitis or significant stenosis. Renals: Both renal arteries are patent without evidence of aneurysm, dissection, vasculitis, fibromuscular dysplasia or significant stenosis. IMA: Patent without evidence of aneurysm, dissection, vasculitis or significant stenosis. Inflow: Patent without evidence of aneurysm, dissection, vasculitis or significant stenosis. Proximal  Outflow: Aortic calcification. Atherosclerotic calcification. No aneurysm. Veins: No obvious venous abnormality within the limitations of this arterial phase study. Review of the MIP images confirms the above findings. NON-VASCULAR Lower chest: Lung bases are clear. Hepatobiliary: No focal hepatic lesion. Normal gallbladder. No biliary duct dilatation. Common bile duct is normal. Pancreas: Pancreas is normal. No ductal dilatation. No pancreatic inflammation. Spleen: Normal spleen Adrenals/urinary tract: Adrenal glands and kidneys are normal. The ureters and bladder normal. Stomach/Bowel: Stomach is normal. On delayed portal venous phase imaging there is a collection of endoluminal contrast within the second portion the duodenum (image 32/11). Findings consistent with active CT chest of bleeding. On arterial phase imaging there is a tiny focus of intraluminal contrast at this site (image 67/5). No evidence of active gastrointestinal bleeding within the small bowel. No small bowel inflammation or dilatation. There is high-density  material within the colon. No evidence of active extravasation IV contrast into the lumen of the LEFT or RIGHT colon. Rectosigmoid colon likewise unremarkable except for several diverticula. Rectum unremarkable. Vascular/Lymphatic: Abdominal aorta is normal caliber with atherosclerotic calcification. There is no retroperitoneal or periportal lymphadenopathy. No pelvic lymphadenopathy. Reproductive: Prostate unremarkable Other: Mild sarcoma of the soft tissues. Musculoskeletal: Degenerative osteophytosis of the spine. IMPRESSION: VASCULAR 1. Active gastrointestinal bleeding in the second portion duodenum evident on the portal venous phase imaging. 2. Atherosclerotic calcification of the aorta is branches without high-grade stenosis. NON-VASCULAR 1. No evidence of bowel obstruction or inflammation. These results will be called to the ordering clinician or representative by the Radiologist Assistant, and communication documented in the PACS or [employer]. Electronically Signed   By: Suzy Bouchard M.D.   On: 02/19/2023 13:59    Scheduled Inpatient Medications:    Chlorhexidine Gluconate Cloth  6 each Topical Daily    Continuous Inpatient Infusions:    sodium chloride 10 mL/hr at 02/20/23 1200   norepinephrine (LEVOPHED) Adult infusion 24 mcg/min (02/20/23 1200)   sodium bicarbonate 150 mEq in dextrose 5 % 1,150 mL infusion 100 mL/hr at 02/20/23 1200   vasopressin 0.04 Units/min (02/20/23 1200)    PRN Inpatient Medications:  mouth rinse  Miscellaneous: N/A  Assessment:  77 y/o AA male with a PMH of HTN, HLD CKD Stage IV not on HD prior to admission, chronic combined diastolic and systolic heart failure, hx of AAA, CAD, COPD on supplemental O2 at home, cirrhosis of the liver, thrombocytopenia, and recurrent lower extremity cellulitis admitted to [facility] 2/14 last month for AMS and circulatory shock.  Now s/p EGD with injection and hemoclip hemostasis. Appears stable, not clear if bleeding is  ongoing, though H/H is stable.  Plan:  Continue observation. Advance to clears. Continue IV protonix, serial H/H.  Chibuike Fleek K. Alice Reichert, M.D. 02/20/2023, 12:37 PM

## 2023-02-20 NOTE — Anesthesia Postprocedure Evaluation (Signed)
Anesthesia Post Note  Patient: John [name]  Procedure(s) Performed: ESOPHAGOGASTRODUODENOSCOPY (EGD) WITH PROPOFOL  Patient location during evaluation: ICU Anesthesia Type: General Level of consciousness: responds to stimulation Pain management: pain level controlled Vital Signs Assessment: post-procedure vital signs reviewed and stable Respiratory status: spontaneous breathing and nonlabored ventilation Cardiovascular status: blood pressure returned to baseline and stable Anesthetic complications: no  No notable events documented.   Last Vitals:  Vitals:   02/20/23 0600 02/20/23 0700  BP: (!) 86/70 (!) 103/59  Pulse: (!) 101   Resp: 19 18  Temp:    SpO2: 91%     Last Pain:  Vitals:   02/20/23 0400  TempSrc: Axillary  PainSc: 0-No pain                 Proofreader

## 2023-02-21 DIAGNOSIS — R579 Shock, unspecified: Secondary | ICD-10-CM | POA: Diagnosis not present

## 2023-02-21 LAB — HEMOGLOBIN AND HEMATOCRIT, BLOOD
HCT: 23 % — ABNORMAL LOW (ref 39.0–52.0)
HCT: 19.8 % — ABNORMAL LOW (ref 39.0–52.0)
HCT: 22 % — ABNORMAL LOW (ref 39.0–52.0)
Hemoglobin: 7.2 g/dL — ABNORMAL LOW (ref 13.0–17.0)
Hemoglobin: 6.3 g/dL — ABNORMAL LOW (ref 13.0–17.0)
Hemoglobin: 6.9 g/dL — ABNORMAL LOW (ref 13.0–17.0)

## 2023-02-21 LAB — GLUCOSE, CAPILLARY
Glucose-Capillary: 125 mg/dL — ABNORMAL HIGH (ref 70–99)
Glucose-Capillary: 167 mg/dL — ABNORMAL HIGH (ref 70–99)
Glucose-Capillary: 135 mg/dL — ABNORMAL HIGH (ref 70–99)
Glucose-Capillary: 65 mg/dL — ABNORMAL LOW (ref 70–99)
Glucose-Capillary: 113 mg/dL — ABNORMAL HIGH (ref 70–99)
Glucose-Capillary: 131 mg/dL — ABNORMAL HIGH (ref 70–99)

## 2023-02-21 LAB — RENAL FUNCTION PANEL
Albumin: 2.1 g/dL — ABNORMAL LOW (ref 3.5–5.0)
Albumin: 1.8 g/dL — ABNORMAL LOW (ref 3.5–5.0)
Anion gap: 6 (ref 5–15)
Anion gap: 4 — ABNORMAL LOW (ref 5–15)
BUN: 41 mg/dL — ABNORMAL HIGH (ref 8–23)
BUN: 29 mg/dL — ABNORMAL HIGH (ref 8–23)
CO2: 31 mmol/L (ref 22–32)
CO2: 30 mmol/L (ref 22–32)
Calcium: 7.9 mg/dL — ABNORMAL LOW (ref 8.9–10.3)
Calcium: 7.8 mg/dL — ABNORMAL LOW (ref 8.9–10.3)
Chloride: 100 mmol/L (ref 98–111)
Chloride: 101 mmol/L (ref 98–111)
Creatinine, Ser: 1.69 mg/dL — ABNORMAL HIGH (ref 0.61–1.24)
Creatinine, Ser: 1.48 mg/dL — ABNORMAL HIGH (ref 0.61–1.24)
GFR, Estimated: 41 mL/min — ABNORMAL LOW (ref >60)
GFR, Estimated: 48 mL/min — ABNORMAL LOW (ref >60)
Glucose, Bld: 161 mg/dL — ABNORMAL HIGH (ref 70–99)
Glucose, Bld: 142 mg/dL — ABNORMAL HIGH (ref 70–99)
Phosphorus: 2.9 mg/dL (ref 2.5–4.6)
Phosphorus: 2.6 mg/dL (ref 2.5–4.6)
Potassium: 3.8 mmol/L (ref 3.5–5.1)
Potassium: 4 mmol/L (ref 3.5–5.1)
Sodium: 137 mmol/L (ref 135–145)
Sodium: 135 mmol/L (ref 135–145)

## 2023-02-21 LAB — MAGNESIUM: Magnesium: 1.7 mg/dL (ref 1.7–2.4)

## 2023-02-21 LAB — PREPARE RBC (CROSSMATCH)

## 2023-02-21 MED ORDER — PUREFLOW DIALYSIS SOLUTION: INTRAVENOUS | Status: DC

## 2023-02-21 MED ORDER — ALTEPLASE 2 MG IJ SOLR
2.0000 mg | Freq: Once | INTRAMUSCULAR | Status: AC
  Administered 2023-02-21: 2 mg via INTRAVENOUS_CENTRAL
  Filled 2023-02-21: qty 2

## 2023-02-21 MED ORDER — PRISMASOL BGK 0/2.5 32-2.5 MEQ/L EC SOLN
Status: DC
  Filled 2023-02-21: qty 5000

## 2023-02-21 MED ORDER — K PHOS MONO-SOD PHOS DI & MONO 155-852-130 MG PO TABS
500.0000 mg | ORAL_TABLET | ORAL | Status: AC
  Administered 2023-02-21: 500 mg via ORAL
  Filled 2023-02-21 (×2): qty 2

## 2023-02-21 MED ORDER — ENSURE ENLIVE PO LIQD
237.0000 mL | Freq: Three times a day (TID) | ORAL | Status: DC
  Administered 2023-02-21 – 2023-03-03 (×24): 237 mL via ORAL

## 2023-02-21 MED ORDER — STERILE WATER FOR INJECTION IJ SOLN
INTRAMUSCULAR | Status: AC
  Administered 2023-02-21: 10 mL
  Filled 2023-02-21: qty 10

## 2023-02-21 MED ORDER — SODIUM CHLORIDE 0.9% IV SOLUTION: Freq: Once | INTRAVENOUS | Status: DC

## 2023-02-21 MED ORDER — SODIUM CHLORIDE 0.9 % IV SOLN
500.0000 [IU]/h | INTRAVENOUS | Status: DC
  Administered 2023-02-21: 750 [IU]/h via INTRAVENOUS_CENTRAL
  Administered 2023-02-21: 500 [IU]/h via INTRAVENOUS_CENTRAL
  Filled 2023-02-21 (×4): qty 2

## 2023-02-21 NOTE — Progress Notes (Signed)
[facility]astroenterology Inpatient Progress Note    Subjective: Patient seen for follow-up for bleeding duodenal ulcer. No acute events overnight. No family or friends bedside. Patient still on pressors. He is on CRRT. Per nursing, no bleeding episodes. Hemoglobin stable.   Objective: Vital signs in last 24 hours: Temp:  [97.6 F (36.4 C)-98.5 F (36.9 C)] 98.5 F (36.9 C) (03/08 1100) Pulse Rate:  [70-98] 76 (03/08 1300) Resp:  [11-24] 19 (03/08 1300) BP: (77-126)/(54-91) 94/59 (03/08 1300) SpO2:  [98 %-100 %] 99 % (03/08 1300) Weight:  [79.7 kg] 79.7 kg (03/08 0500) Blood pressure (!) 94/59, pulse 76, temperature 98.5 F (36.9 C), resp. rate 19, height '5\' 11"'$  (1.803 m), weight 79.7 kg, SpO2 99 %.    Intake/Output from previous day: 03/07 0701 - 03/08 0700 In: 4506 [P.O.:270; I.V.:2820.1; Blood:358; IV Piggyback:1057.9] Out: 2479 [Urine:610]  Intake/Output this shift: Total I/O In: 491.1 [I.V.:491.1] Out: 604 [Urine:50]   Gen: NAD.Extubated. Sleeping comfortably. Arouseable.   HEENT: [place]/AT. PERRLA. Normal external ear exam.  Chest: CTA, no wheezes.  CV: RR nl S1, S2. No gallops.  Abd: soft, nt, nd. BS+  Ext: no edema. Pulses 2+  Neuro: Alert and oriented. Judgement appears normal. Nonfocal.   Lab Results: Results for orders placed or performed during the hospital encounter of 01/29/23 (from the past 24 hour(s))  Basic metabolic panel     Status: Abnormal   Collection Time: 02/20/23  2:26 PM  Result Value Ref Range   Sodium 139 135 - 145 mmol/L   Potassium 6.2 (H) 3.5 - 5.1 mmol/L   Chloride 106 98 - 111 mmol/L   CO2 28 22 - 32 mmol/L   Glucose, Bld 167 (H) 70 - 99 mg/dL   BUN 79 (H) 8 - 23 mg/dL   Creatinine, Ser 2.78 (H) 0.61 - 1.24 mg/dL   Calcium 8.0 (L) 8.9 - 10.3 mg/dL   GFR, Estimated 23 (L) >60 mL/min   Anion gap 5 5 - 15  Hemoglobin and hematocrit, blood     Status: Abnormal   Collection Time: 02/20/23  2:26 PM  Result Value Ref Range    Hemoglobin 7.1 (L) 13.0 - 17.0 g/dL   HCT 22.2 (L) 39.0 - 52.0 %  Renal function panel (daily at 1600)     Status: Abnormal   Collection Time: 02/20/23  6:01 PM  Result Value Ref Range   Sodium 137 135 - 145 mmol/L   Potassium 5.7 (H) 3.5 - 5.1 mmol/L   Chloride 103 98 - 111 mmol/L   CO2 29 22 - 32 mmol/L   Glucose, Bld 211 (H) 70 - 99 mg/dL   BUN 78 (H) 8 - 23 mg/dL   Creatinine, Ser 2.85 (H) 0.61 - 1.24 mg/dL   Calcium 7.9 (L) 8.9 - 10.3 mg/dL   Phosphorus 4.6 2.5 - 4.6 mg/dL   Albumin 2.1 (L) 3.5 - 5.0 g/dL   GFR, Estimated 22 (L) >60 mL/min   Anion gap 5 5 - 15  Hemoglobin and hematocrit, blood     Status: Abnormal   Collection Time: 02/20/23  9:05 PM  Result Value Ref Range   Hemoglobin 6.9 (L) 13.0 - 17.0 g/dL   HCT 22.4 (L) 39.0 - [id number] %  Basic metabolic panel     Status: Abnormal   Collection Time: 02/20/23 11:07 PM  Result Value Ref Range   Sodium 137 135 - 145 mmol/L   Potassium 4.2 3.5 - 5.1 mmol/L   Chloride 101 98 -  111 mmol/L   CO2 29 22 - 32 mmol/L   Glucose, Bld 222 (H) 70 - 99 mg/dL   BUN 58 (H) 8 - 23 mg/dL   Creatinine, Ser 2.08 (H) 0.61 - 1.24 mg/dL   Calcium 7.6 (L) 8.9 - 10.3 mg/dL   GFR, Estimated 32 (L) >60 mL/min   Anion gap 7 5 - 15  Glucose, capillary     Status: Abnormal   Collection Time: 02/20/23 11:47 PM  Result Value Ref Range   Glucose-Capillary 174 (H) 70 - 99 mg/dL  Glucose, capillary     Status: Abnormal   Collection Time: 02/21/23  4:03 AM  Result Value Ref Range   Glucose-Capillary 125 (H) 70 - 99 mg/dL  Magnesium     Status: None   Collection Time: 02/21/23  6:16 AM  Result Value Ref Range   Magnesium 1.7 1.7 - 2.4 mg/dL  Renal function panel (daily at 0500)     Status: Abnormal   Collection Time: 02/21/23  6:17 AM  Result Value Ref Range   Sodium 137 135 - 145 mmol/L   Potassium 3.8 3.5 - 5.1 mmol/L   Chloride 100 98 - 111 mmol/L   CO2 31 22 - 32 mmol/L   Glucose, Bld 161 (H) 70 - 99 mg/dL   BUN 41 (H) 8 - 23 mg/dL    Creatinine, Ser 1.69 (H) 0.61 - 1.24 mg/dL   Calcium 7.9 (L) 8.9 - 10.3 mg/dL   Phosphorus 2.9 2.5 - 4.6 mg/dL   Albumin 2.1 (L) 3.5 - 5.0 g/dL   GFR, Estimated 41 (L) >60 mL/min   Anion gap 6 5 - 15  Hemoglobin and hematocrit, blood     Status: Abnormal   Collection Time: 02/21/23 10:01 AM  Result Value Ref Range   Hemoglobin 7.2 (L) 13.0 - 17.0 g/dL   HCT 23.0 (L) 39.0 - 52.0 %  Glucose, capillary     Status: Abnormal   Collection Time: 02/21/23 11:52 AM  Result Value Ref Range   Glucose-Capillary 167 (H) 70 - 99 mg/dL     Recent Labs    02/19/23 0602 02/19/23 0840 02/19/23 1734 02/19/23 2325 02/20/23 0646 02/20/23 1426 02/20/23 2105 02/21/23 1001  WBC 9.1  --  16.0*  --  33.1*  --   --   --   HGB 9.3*   < > 7.6*   < > 8.2* 7.1* 6.9* 7.2*  HCT 30.0*   < > 25.2*   < > 25.4* 22.2* 22.4* 23.0*  PLT 192  --  129*  --  129*  --   --   --    < > = values in this interval not displayed.   BMET Recent Labs    02/20/23 1801 02/20/23 2307 02/21/23 0617  NA 137 137 137  K 5.7* 4.2 3.8  CL 103 101 100  CO2 '29 29 31  '$ GLUCOSE 211* 222* 161*  BUN 78* 58* 41*  CREATININE 2.85* 2.08* 1.69*  CALCIUM 7.9* 7.6* 7.9*   LFT Recent Labs    02/19/23 1734 02/20/23 0553 02/21/23 0617  PROT 4.1*  --   --   ALBUMIN 2.2*   < > 2.1*  AST 35  --   --   ALT 22  --   --   ALKPHOS 64  --   --   BILITOT 1.5*  --   --    < > = values in this interval not displayed.   PT/INR Recent Labs  02/19/23 0602  LABPROT 14.8  INR 1.2   Hepatitis Panel No results for input(s): "HEPBSAG", "HCVAB", "HEPAIGM", "HEPBIGM" in the last 72 hours. C-Diff No results for input(s): "CDIFFTOX" in the last 72 hours. No results for input(s): "CDIFFPCR" in the last 72 hours.  Scheduled Inpatient Medications:    vitamin C  500 mg Oral BID   Chlorhexidine Gluconate Cloth  6 each Topical Daily   feeding supplement  237 mL Oral TID BM   multivitamin  1 tablet Oral QHS   pantoprazole (PROTONIX) IV   40 mg Intravenous Q12H    Continuous Inpatient Infusions:    sodium chloride Stopped (02/21/23 0330)   heparin 10,000 units/ 20 mL infusion syringe 500 Units/hr (02/21/23 1300)   norepinephrine (LEVOPHED) Adult infusion 7 mcg/min (02/21/23 1300)   prismasol BGK 2/2.5 dialysis solution 2,000 mL/hr at 02/21/23 1108   prismasol BGK 2/2.5 replacement solution 400 mL/hr at 02/21/23 0405   prismasol BGK 2/2.5 replacement solution 400 mL/hr at 02/21/23 0405   vasopressin 0.04 Units/min (02/21/23 1300)    PRN Inpatient Medications:  heparin, mouth rinse, sodium chloride  Miscellaneous: N/A  Assessment:  77 y/o AA male with a PMH of HTN, HLD CKD Stage IV not on HD prior to admission, chronic combined diastolic and systolic heart failure, hx of AAA, CAD, COPD on supplemental O2 at home, cirrhosis of the liver, thrombocytopenia, and recurrent lower extremity cellulitis admitted to [facility] 2/14 last month for AMS and circulatory shock.  Now s/p EGD with injection and hemoclip hemostasis. Appears stable. No evidence of recurrent gastrointestinal bleeding. H&H Stable.   Plan:  Continue observation. Advance to full liquid diet. ADAT Continue IV protonix, serial H/H. GI will sign off at this time and follow peripherally. Dr. Marius Ditch will be covering over the weekend.    Octavia Bruckner, PA-C [facility]  [phone] (Office #) 02/21/2023, 1:44 PM

## 2023-02-21 NOTE — Progress Notes (Signed)
PROGRESS NOTE    John [name]   [id number] DOB: 23-Mar-1946  DOA: 01/29/2023 Date of Service: 02/21/23 PCP: Alisa Graff, FNP     Brief Narrative / Hospital Course:  77 year old male with a history of essential hypertension, on hydralazine and Cozaar, stage IV CKD but has not been on dialysis in the past, has chronic diastolic and systolic heart failure with cor pulmonale, moderate tricuspid regurgitation and aortic valve regurgitation, history of ascending aortic aneurysm, coronary artery disease with atherosclerosis, COPD and chronic hypoxemia baseline 2 L/min supplemental oxygen at home with recurrent episodes of hypoxemia and remote acute exacerbation of COPD with hypoxemia and hypercapnia 2 months ago history of ruptured left quadricep tendon 2 years ago, chronic thrombocytopenia and erythrocytosis recurrent lower extremity cellulitis, recurrent hyperkalemia, morbid obesity history of anasarca dyslipidemia, recent admission for acute CHF exacerbation requiring Lasix drip and BiPAP support, who was brought in by EMS due to worsening altered mental status and circulatory shock.    Patient received CRRT.  He also required intubation on account of worsening respiratory function.  Currently extubated.  He made decision for no dialysis and as such dialysis catheter was removed. Patient had discussion with the girlfriend and later on requested to be on dialysis.  Dialysis catheter was then replaced and dialysis was resumed.  Later nephrologist recommended removal of dialysis catheter given renal recovery at this time.    Patient was medically stable pending placement in a skilled nursing facility when he developed profound GI bleeding early morning 02/19/23, GO consulted, CTA done despite renal function given emergent nature, (+)duodenal bleed, taken for emergent EGD. See IPAL note - pt has also asked [place] NOT to inform his girlfriend [place]. Fluid resuscitation and PRBC pending EGD, known risk  w/ CHF and previous dialysis   03/07 duodenal bleed on CTA, EGD done, extubated post-procedure and on pressor support, see GOC discussion in progress note, may need dialysis, hyperkalemic. Started on CRRT.   03/08: stable on pressors and CRRT, more alert and conversational today     Consultants:  Gastroenterology - Dr [facility]  Cardiology Nephrology Vascular Surgery   Procedures: Emergent EGD 02/19/23 for duodenal bleed 01/29/23 Insertion of Non-tunneled Central Venous Catheter with US guidance        ASSESSMENT & PLAN:   Principal Problem:   Shock circulatory ([place]) Active Problems:   Acute on chronic congestive heart failure ([place])   Acute hypoxic respiratory failure ([facility])   Toxic metabolic encephalopathy  Hypovolemic shock d/t ABLA - GI bleed Trend CBC/HH and transfuse as needed  Protonix IV bid  Pressors to titrate  No anticoags   Hyperkalemia - resolved Treating as needed and following BMP  Cardiogenic shock POA d/t HFpEF - improved but now hypovolemic shock d/t GI bleed and Acute decompensated HFpEF and right-sided heart failure RV dysfunction and dilation on echocardiography, concerning for RV failure and pulmonary hypertension. Porto-pulmonary hypertension on the differential given history of liver cirrhosis.  02/19/23 w/ GI bleed and hypovolemic shock he underwent aggressive fluid resuscitation and PRBC pending EGD, known risk w/ CHF and previous dialysis - monitor fluid status closely  [facility] for ultra-filtration for volume removal Cardiology had previously signed off, may need to reengage but no further diagnostics from their standpoint. Nephrology is on board we appreciate input   Acute Hypoxic Respiratory Failure d/t HFpEF and hypovolemic shock Continue supplemental O2 NO REINTUBATION if respiratory distress    Toxic Metabolic Encephalopathy - improved but waxes and wanes I judge  his decision-making capacity to be limited but not absent.   See [place] discussions below   Acute kidney injury on chronic kidney disease stage IV CRRT per nephrology and intensivist teams   Hepatic cirrhosis Thrombocytopenia.   Was on cefepime empirically given leukocytosis.  Cultures remain negative. Finished 5 days of antibiotics.  Monitor CBC, CMP  Goals of Care I judge his capacity to be limited but not absent. He appears to have a basic understanding of his situation and his options, though lethargy precludes him communicating in any great detail. He appears more alert and is more communicative 02/20/23 - 02/21/23 Intubation for procedural intervention w/ anesthesia does not violate a DNR and patient assents to this if needed  He has expressed clear general goals -  wants to do what is possible to live if medical interventions to reach this goal are judged to be futile, and/or if respiratory or cardiac distress/arrest occurs, will transition to comfort care He would like not to be kept on long term life support machines (ie if intubated for procedure and not able to wen off vent in a couple days, would like to be transitioned to comfort care)  Remains DNR I made clear to him that if he decompensates to the point that intubation is needed to maintain respiration, I do not think he would come off the ventilator - he agrees DO NOT INTUBATE IN SUCH A CASE.  I made clear to him that if he experiences severe cardiac arrhythmia or arrest, I do not think he would meaningfully recovery cardiac function despite aggressive intervention - he agrees NO CPR/DEFIBRILLATION.  in absence of ideal decision-making capacity, and in absence of a designated surrogate decision-maker or [place] other than his attending physician (in cooperation their consulting physicians), it is ethically appropriate to treat with presumed goal of preserving life, but withholding or discontinuing treatments which are judged by 2+ physicians to be medically futile.      DVT  prophylaxis: SCD Pertinent IV fluids/nutrition: diet as tolerated  Central lines / invasive devices: HD cath L femoral, CVC R femoral, foley   Code Status: DNR  Current Admission Status: inpatient  TOC needs / Dispo plan: SNF Barriers to discharge / significant pending items: medically unstable GI bleed --> circulatory shock, CRRT              Subjective / Brief ROS:  Patient reports feeling about the same today but he is much more conversational.  Denies CP/SOB.  Pain controlled.  Denies new weakness.  Tolerating diet Reports no concerns w/ urination/defecation - bowel incontinence / bloody stool has resolved .   Family Communication: none at this time - he states we can update [name] as needed, he does not specifically request we call her today. I told him if he WANTS [place] to call her we will. Otherwise will attempt to inform her if significant changes.     Objective Findings:  Vitals:   02/21/23 0700 02/21/23 0800 02/21/23 0900 02/21/23 1000  BP: 119/67 (!) 98/54 109/62 (!) 86/60  Pulse: 74 72 71 76  Resp: '18 18 20 20  '$ Temp:      TempSrc:      SpO2: 100% 100% 100% 100%  Weight:      Height:        Intake/Output Summary (Last 24 hours) at 02/21/2023 1003 Last data filed at 02/21/2023 1000 Gross per 24 hour  Intake 4385.48 ml  Output 2858 ml  Net 1527.48 ml  Filed Weights   02/19/23 0802 02/20/23 0500 02/21/23 0500  Weight: 79.5 kg 82.1 kg 79.7 kg    Examination:  Physical Exam Constitutional:      General: He is not in acute distress.    Appearance: He is ill-appearing.  HENT:     Mouth/Throat:     Mouth: Mucous membranes are dry.  Cardiovascular:     Rate and Rhythm: Normal rate and regular rhythm.  Pulmonary:     Effort: Pulmonary effort is normal. No respiratory distress.     Breath sounds: Rales present.  Abdominal:     General: Abdomen is flat.     Palpations: Abdomen is soft.  Musculoskeletal:     Right lower leg: Edema (trace) present.      Left lower leg: Edema (trace) present.  Skin:    General: Skin is dry.  Neurological:     General: No focal deficit present.     Mental Status: He is alert. Mental status is at baseline.     Cranial Nerves: No cranial nerve deficit.  Psychiatric:        Mood and Affect: Mood normal.        Behavior: Behavior normal.          Scheduled Medications:   vitamin C  500 mg Oral BID   Chlorhexidine Gluconate Cloth  6 each Topical Daily   feeding supplement  1 Container Oral TID BM   multivitamin  1 tablet Oral QHS   pantoprazole (PROTONIX) IV  40 mg Intravenous Q12H    Continuous Infusions:  sodium chloride Stopped (02/21/23 0330)   heparin 10,000 units/ 20 mL infusion syringe 500 Units/hr (02/21/23 1000)   norepinephrine (LEVOPHED) Adult infusion 11 mcg/min (02/21/23 1000)   prismasol BGK 2/2.5 dialysis solution 2,000 mL/hr at 02/21/23 0837   prismasol BGK 2/2.5 replacement solution 400 mL/hr at 02/21/23 0405   prismasol BGK 2/2.5 replacement solution 400 mL/hr at 02/21/23 0405   sodium bicarbonate 150 mEq in dextrose 5 % 1,150 mL infusion 100 mL/hr at 02/21/23 1000   vasopressin 0.04 Units/min (02/21/23 1000)    PRN Medications:  heparin, mouth rinse, sodium chloride  Antimicrobials from admission:  Anti-infectives (From admission, onward)    Start     Dose/Rate Route Frequency Ordered Stop   02/19/23 0645  cefTRIAXone (ROCEPHIN) 1 g in sodium chloride 0.9 % 100 mL IVPB  Status:  Discontinued        1 g 200 mL/hr over 30 Minutes Intravenous Every 24 hours 02/19/23 0556 02/20/23 0844   02/11/23 0000  ceFAZolin (ANCEF) IVPB 1 g/50 mL premix       Note to Pharmacy: Send with pt to OR   1 g 100 mL/hr over 30 Minutes Intravenous On call 02/10/23 0908 02/10/23 0904   01/31/23 1700  ceFEPIme (MAXIPIME) 2 g in sodium chloride 0.9 % 100 mL IVPB  Status:  Discontinued        2 g 200 mL/hr over 30 Minutes Intravenous Every 12 hours 01/31/23 1555 02/03/23 1048   01/30/23 1700   vancomycin (VANCOREADY) IVPB 1250 mg/250 mL  Status:  Discontinued        1,250 mg 166.7 mL/hr over 90 Minutes Intravenous Every 24 hours 01/29/23 1543 01/30/23 1036   01/30/23 1300  Ampicillin-Sulbactam (UNASYN) 3 g in sodium chloride 0.9 % 100 mL IVPB  Status:  Discontinued        3 g 200 mL/hr over 30 Minutes Intravenous Every 8 hours 01/30/23 1036 01/31/23  1118   01/30/23 0000  vancomycin variable dose per unstable renal function (pharmacist dosing)  Status:  Discontinued         Does not apply See admin instructions 01/29/23 1125 01/29/23 1548   01/29/23 2200  piperacillin-tazobactam (ZOSYN) IVPB 3.375 g  Status:  Discontinued        3.375 g 12.5 mL/hr over 240 Minutes Intravenous Every 6 hours 01/29/23 1547 01/29/23 1548   01/29/23 2200  piperacillin-tazobactam (ZOSYN) IVPB 3.375 g  Status:  Discontinued        3.375 g 100 mL/hr over 30 Minutes Intravenous Every 6 hours 01/29/23 1548 01/30/23 1036   01/29/23 2100  piperacillin-tazobactam (ZOSYN) IVPB 2.25 g  Status:  Discontinued        2.25 g 100 mL/hr over 30 Minutes Intravenous Every 8 hours 01/29/23 1125 01/29/23 1547   01/29/23 1115  piperacillin-tazobactam (ZOSYN) IVPB 3.375 g        3.375 g 100 mL/hr over 30 Minutes Intravenous  Once 01/29/23 1103 01/29/23 1457   01/29/23 1115  vancomycin (VANCOREADY) IVPB 2000 mg/400 mL        2,000 mg 200 mL/hr over 120 Minutes Intravenous  Once 01/29/23 1104 01/29/23 1656           Data Reviewed:  I have personally reviewed the following...  CBC: Recent Labs  Lab 02/16/23 0403 02/17/23 0542 02/18/23 0730 02/19/23 0602 02/19/23 0840 02/19/23 1734 02/19/23 2325 02/20/23 0553 02/20/23 0646 02/20/23 1426 02/20/23 2105  WBC 9.2 7.7 6.1 9.1  --  16.0*  --   --  33.1*  --   --   NEUTROABS 7.6 6.2 4.9 7.2  --  12.4*  --   --   --   --   --   HGB 11.6* 11.8* 11.9* 9.3*   < > 7.6* 9.6* 8.3* 8.2* 7.1* 6.9*  HCT 37.1* 38.1* 37.5* 30.0*   < > 25.2* 30.0* 25.6* 25.4* 22.2* 22.4*   MCV 103.3* 103.3* 101.9* 103.1*  --  98.1  --   --  93.4  --   --   PLT 186 197 184 192  --  129*  --   --  129*  --   --    < > = values in this interval not displayed.   Basic Metabolic Panel: Recent Labs  Lab 02/17/23 0542 02/17/23 0543 02/18/23 0730 02/18/23 0731 02/19/23 0602 02/19/23 0604 02/19/23 1734 02/20/23 0553 02/20/23 [id number] 02/20/23 1426 02/20/23 1801 02/20/23 2307 02/21/23 0616 02/21/23 0617  NA  --    < >  --  135 139  --    < > 138 139 139 137 137  --  137  K  --    < >  --  3.9 5.1  --    < > 6.6* 6.3* 6.2* 5.7* 4.2  --  3.8  CL  --    < >  --  101 102  --    < > 106 106 106 103 101  --  100  CO2  --    < >  --  31 32  --    < > '25 25 28 29 29  '$ --  31  GLUCOSE  --    < >  --  97 117*  --    < > 130* 139* 167* 211* 222*  --  161*  BUN  --    < >  --  51* 70*  --    < >  82* 81* 79* 78* 58*  --  41*  CREATININE  --    < >  --  2.11* 2.35*  --    < > 2.91* 2.82* 2.78* 2.85* 2.08*  --  1.69*  CALCIUM  --    < >  --  9.1 8.7*  --    < > 8.3* 8.2* 8.0* 7.9* 7.6*  --  7.9*  MG 2.2  --  2.1  --  2.1  --   --  1.8  --   --   --   --  1.7  --   PHOS  --    < >  --  3.3  --  2.4*  --  4.5  --   --  4.6  --   --  2.9   < > = values in this interval not displayed.   GFR: Estimated Creatinine Clearance: 39 mL/min (A) (by C-G formula based on SCr of 1.69 mg/dL (H)). Liver Function Tests: Recent Labs  Lab 02/19/23 0602 02/19/23 1734 02/20/23 0553 02/20/23 1801 02/21/23 0617  AST 32 35  --   --   --   ALT 24 22  --   --   --   ALKPHOS 97 64  --   --   --   BILITOT 0.8 1.5*  --   --   --   PROT 5.1* 4.1*  --   --   --   ALBUMIN 2.2* 2.2* 2.2* 2.1* 2.1*   No results for input(s): "LIPASE", "AMYLASE" in the last 168 hours. No results for input(s): "AMMONIA" in the last 168 hours. Coagulation Profile: Recent Labs  Lab 02/19/23 0602  INR 1.2   Cardiac Enzymes: No results for input(s): "CKTOTAL", "CKMB", "CKMBINDEX", "TROPONINI" in the last 168 hours. BNP (last 3  results) No results for input(s): "PROBNP" in the last 8760 hours. HbA1C: No results for input(s): "HGBA1C" in the last 72 hours. CBG: Recent Labs  Lab 02/19/23 0952 02/20/23 2347 02/21/23 0403  GLUCAP 76 174* 125*   Lipid Profile: No results for input(s): "CHOL", "HDL", "LDLCALC", "TRIG", "CHOLHDL", "LDLDIRECT" in the last 72 hours. Thyroid Function Tests: No results for input(s): "TSH", "T4TOTAL", "FREET4", "T3FREE", "THYROIDAB" in the last 72 hours. Anemia Panel: No results for input(s): "VITAMINB12", "FOLATE", "FERRITIN", "TIBC", "IRON", "RETICCTPCT" in the last 72 hours. Most Recent Urinalysis On File:     Component Value Date/Time   COLORURINE YELLOW (A) 01/29/2023 2227   APPEARANCEUR HAZY (A) 01/29/2023 2227   LABSPEC 1.014 01/29/2023 2227   PHURINE 5.0 01/29/2023 2227   GLUCOSEU NEGATIVE 01/29/2023 2227   HGBUR MODERATE (A) 01/29/2023 2227   BILIRUBINUR NEGATIVE 01/29/2023 2227   KETONESUR 5 (A) 01/29/2023 2227   PROTEINUR NEGATIVE 01/29/2023 2227   NITRITE NEGATIVE 01/29/2023 2227   LEUKOCYTESUR TRACE (A) 01/29/2023 2227   Sepsis Labs: '@LABRCNTIP'$ (procalcitonin:4,lacticidven:4) Microbiology: No results found for this or any previous visit (from the past 240 hour(s)).    Radiology Studies last 3 days: CT ANGIO GI BLEED  Result Date: 02/19/2023 CLINICAL DATA:  Bloody bowel movement. Concern for active gastrointestinal bleeding. EXAM: CTA ABDOMEN AND PELVIS WITHOUT AND WITH CONTRAST TECHNIQUE: Multidetector CT imaging of the abdomen and pelvis was performed using the standard protocol during bolus administration of intravenous contrast. Multiplanar reconstructed images and MIPs were obtained and reviewed to evaluate the vascular anatomy. RADIATION DOSE REDUCTION: This exam was performed according to the departmental dose-optimization program which includes automated exposure control, adjustment of the mA and/or  kV according to patient size and/or use of iterative  reconstruction technique. CONTRAST:  88m OMNIPAQUE IOHEXOL 350 MG/ML SOLN COMPARISON:  None Available. FINDINGS: VASCULAR Aorta: Atherosclerotic calcification of the aorta. Celiac: Patent without evidence of aneurysm, dissection, vasculitis or significant stenosis. SMA: Patent without evidence of aneurysm, dissection, vasculitis or significant stenosis. Renals: Both renal arteries are patent without evidence of aneurysm, dissection, vasculitis, fibromuscular dysplasia or significant stenosis. IMA: Patent without evidence of aneurysm, dissection, vasculitis or significant stenosis. Inflow: Patent without evidence of aneurysm, dissection, vasculitis or significant stenosis. Proximal Outflow: Aortic calcification. Atherosclerotic calcification. No aneurysm. Veins: No obvious venous abnormality within the limitations of this arterial phase study. Review of the MIP images confirms the above findings. NON-VASCULAR Lower chest: Lung bases are clear. Hepatobiliary: No focal hepatic lesion. Normal gallbladder. No biliary duct dilatation. Common bile duct is normal. Pancreas: Pancreas is normal. No ductal dilatation. No pancreatic inflammation. Spleen: Normal spleen Adrenals/urinary tract: Adrenal glands and kidneys are normal. The ureters and bladder normal. Stomach/Bowel: Stomach is normal. On delayed portal venous phase imaging there is a collection of endoluminal contrast within the second portion the duodenum (image 32/11). Findings consistent with active CT chest of bleeding. On arterial phase imaging there is a tiny focus of intraluminal contrast at this site (image 67/5). No evidence of active gastrointestinal bleeding within the small bowel. No small bowel inflammation or dilatation. There is high-density material within the colon. No evidence of active extravasation IV contrast into the lumen of the LEFT or RIGHT colon. Rectosigmoid colon likewise unremarkable except for several diverticula. Rectum unremarkable.  Vascular/Lymphatic: Abdominal aorta is normal caliber with atherosclerotic calcification. There is no retroperitoneal or periportal lymphadenopathy. No pelvic lymphadenopathy. Reproductive: Prostate unremarkable Other: Mild sarcoma of the soft tissues. Musculoskeletal: Degenerative osteophytosis of the spine. IMPRESSION: VASCULAR 1. Active gastrointestinal bleeding in the second portion duodenum evident on the portal venous phase imaging. 2. Atherosclerotic calcification of the aorta is branches without high-grade stenosis. NON-VASCULAR 1. No evidence of bowel obstruction or inflammation. These results will be called to the ordering clinician or representative by the Radiologist Assistant, and communication documented in the PACS or [employer] Electronically Signed   By: SSuzy BouchardM.D.   On: 02/19/2023 13:59   PERIPHERAL VASCULAR CATHETERIZATION  Result Date: 02/17/2023 See surgical note for result.            LOS: 23 days    Time spent: 50 mins     NEmeterio Reeve DO [facility] 02/21/2023, 10:03 AM    Dictation software may have been used to generate the above note. Typos may occur and escape review in typed/dictated notes. Please contact Dr ASheppard Coildirectly for clarity if needed.  Staff may message me via secure chat in [place] but this may not receive an immediate response,  please page me for urgent matters!  If 7PM-7AM, please contact night coverage [email or web address]

## 2023-02-21 NOTE — Progress Notes (Signed)
CRRT stopped  Filter clotted with high pressures. Will restarting CRRT.

## 2023-02-21 NOTE — Progress Notes (Signed)
CRRT restarted with no complications. Patients Hemoglobin 6.3, MD will order 1 unit of blood. Continue to assess.

## 2023-02-21 NOTE — Consult Note (Signed)
PHARMACY CONSULT NOTE - FOLLOW UP  Pharmacy Consult for Electrolyte Monitoring and Replacement   Recent Labs: Potassium (mmol/L)  Date Value  02/21/2023 4.0   Magnesium (mg/dL)  Date Value  02/21/2023 1.7   Calcium (mg/dL)  Date Value  02/21/2023 7.8 (L)   Albumin (g/dL)  Date Value  02/21/2023 1.8 (L)   Phosphorus (mg/dL)  Date Value  02/21/2023 2.6   Sodium (mmol/L)  Date Value  02/21/2023 135     Assessment: 77 yo M with PMH HTN, CKD, CHF (EF 50-55%), AAA, CAD, COPD presents with hypovolemic shock d/t ABLA - GI bleed. Pt in AKI on CKD4, now on CRRT.   Goal of Therapy:  K >/= 4.0 and Mg >/= 2.0  Plan:  Phos 4.6 >> 2.6 in less than 24 hours Phos Neutral '500mg'$  PO Q4H x 2 doses Follow up electrolytes tomorrow AM  Aubery Lapping, PharmD 02/21/2023 5:46 PM

## 2023-02-21 NOTE — Progress Notes (Signed)
Dr. Holley Raring notified regarding new systemic heparin order for the CRRT Machine due to filter clotting off and patient K+ results of 4.2. Verbal order given to change 0K bath to 2K bath.

## 2023-02-21 NOTE — Progress Notes (Signed)
Spoke with Dr. Boykin Peek palliative needs at this time. PMT to follow peripherally. Encouraged to reengage if needs arise.  No Charge.  Theodoro Grist, DNP, AGNP-C Palliative Medicine  Please call Palliative Medicine team phone with any questions [phone]. For individual providers please see AMION.

## 2023-02-21 NOTE — Progress Notes (Signed)
[name]  ROUNDING NOTE   Subjective:   [name] was admitted to [facility] on 01/29/2023 for Peripheral edema [R60.9] Shock circulatory ([place]) [R57.9] Acute on chronic congestive heart failure, unspecified heart failure type ([place]) [I50.9] Acute renal failure superimposed on chronic kidney disease, unspecified acute renal failure type, unspecified CKD stage ([place]) [N17.9, N18.9]  Placed on CRRT yesterday. Changed to 2K this morning.   Norepinephrine and vasopressin gtt.   UOP 64m.   Objective:  Vital signs in last 24 hours:  Temp:  [97.6 F (36.4 C)-98.5 F (36.9 C)] 98.5 F (36.9 C) (03/08 1100) Pulse Rate:  [70-98] 72 (03/08 1100) Resp:  [11-24] 20 (03/08 1100) BP: (77-126)/(54-91) 98/58 (03/08 1100) SpO2:  [98 %-100 %] 100 % (03/08 1100) Weight:  [79.7 kg] 79.7 kg (03/08 0500)  Weight change: 0.2 kg Filed Weights   02/19/23 0802 02/20/23 0500 02/21/23 0500  Weight: 79.5 kg 82.1 kg 79.7 kg    Intake/Output: I/O last 3 completed shifts: In: [id number][P.O.:270; I.V.:3566.1; Blood:1790; IV Piggyback:1157.9] Out: 2479 [Urine:610]   Intake/Output this shift:  Total I/O In: 472.5 [I.V.:472.5] Out: 522 [Urine:25]  Physical Exam: General: Ill appearing  Head: Normocephalic, atraumatic   Eyes: Anicteric  Lungs:  Clear, 10L [place] O2  Heart: regular  Abdomen:  Soft, nontender  Extremities: + peripheral edema   Neurologic: Alert and oriented  Skin: Left lower leg wound in clean and dry dressing  Access: Left femoral temp HD catheter 3/7 ICU    Basic Metabolic Panel: Recent Labs  Lab 02/17/23 0542 02/17/23 0543 02/18/23 0730 02/18/23 0731 02/19/23 0602 02/19/23 0604 02/19/23 1734 02/20/23 0553 02/20/23 0646 02/20/23 1426 02/20/23 1801 02/20/23 2307 02/21/23 0616 02/21/23 0617  NA  --    < >  --  135 139  --    < > 138 139 139 137 137  --  137  K  --    < >  --  3.9 5.1  --    < > 6.6* 6.3* 6.2* 5.7* 4.2  --  3.8  CL  --    < >  --  101 102   --    < > 106 106 106 103 101  --  100  CO2  --    < >  --  31 32  --    < > '25 25 28 29 29  '$ --  31  GLUCOSE  --    < >  --  97 117*  --    < > 130* 139* 167* 211* 222*  --  161*  BUN  --    < >  --  51* 70*  --    < > 82* 81* 79* 78* 58*  --  41*  CREATININE  --    < >  --  2.11* 2.35*  --    < > 2.91* 2.82* 2.78* 2.85* 2.08*  --  1.69*  CALCIUM  --    < >  --  9.1 8.7*  --    < > 8.3* 8.2* 8.0* 7.9* 7.6*  --  7.9*  MG 2.2  --  2.1  --  2.1  --   --  1.8  --   --   --   --  1.7  --   PHOS  --    < >  --  3.3  --  2.4*  --  4.5  --   --  4.6  --   --  2.9   < > = values in this interval not displayed.     Liver Function Tests: Recent Labs  Lab 02/19/23 0602 02/19/23 1734 02/20/23 0553 02/20/23 1801 02/21/23 0617  AST 32 35  --   --   --   ALT 24 22  --   --   --   ALKPHOS 97 64  --   --   --   BILITOT 0.8 1.5*  --   --   --   PROT 5.1* 4.1*  --   --   --   ALBUMIN 2.2* 2.2* 2.2* 2.1* 2.1*    No results for input(s): "LIPASE", "AMYLASE" in the last 168 hours. No results for input(s): "AMMONIA" in the last 168 hours.   CBC: Recent Labs  Lab 02/16/23 0403 02/17/23 0542 02/18/23 0730 02/19/23 0602 02/19/23 0840 02/19/23 1734 02/19/23 2325 02/20/23 0553 02/20/23 0646 02/20/23 1426 02/20/23 2105 02/21/23 1001  WBC 9.2 7.7 6.1 9.1  --  16.0*  --   --  33.1*  --   --   --   NEUTROABS 7.6 6.2 4.9 7.2  --  12.4*  --   --   --   --   --   --   HGB 11.6* 11.8* 11.9* 9.3*   < > 7.6*   < > 8.3* 8.2* 7.1* 6.9* 7.2*  HCT 37.1* 38.1* 37.5* 30.0*   < > 25.2*   < > 25.6* 25.4* 22.2* 22.4* 23.0*  MCV 103.3* 103.3* 101.9* 103.1*  --  98.1  --   --  93.4  --   --   --   PLT 186 197 184 192  --  129*  --   --  129*  --   --   --    < > = values in this interval not displayed.     Cardiac Enzymes: No results for input(s): "CKTOTAL", "CKMB", "CKMBINDEX", "TROPONINI" in the last 168 hours.  BNP: Invalid input(s): "POCBNP"  CBG: Recent Labs  Lab 02/19/23 0952 02/20/23 2347  02/21/23 0403 02/21/23 1152  GLUCAP 76 174* 125* 167*     Microbiology: Results for orders placed or performed during the hospital encounter of 01/29/23  Culture, Respiratory w Gram Stain     Status: None   Collection Time: 01/29/23  3:45 AM   Specimen: Tracheal Aspirate; Respiratory  Result Value Ref Range Status   Specimen Description   Final    TRACHEAL ASPIRATE Performed at [facility], [address], [place], [place] [address]    Special Requests   Final    NONE Performed at [facility], [address] [place], [place] [address]    Gram Stain   Final    MODERATE WBC PRESENT,BOTH PMN AND MONONUCLEAR NO ORGANISMS SEEN    Culture   Final    RARE Normal respiratory flora-no Staph aureus or Pseudomonas seen Performed at [facility] [address] [place],  [address]    Report Status 02/01/2023 FINAL  Final  Culture, blood (Routine X 2) w Reflex to ID Panel     Status: None   Collection Time: 01/29/23  1:10 PM   Specimen: BLOOD  Result Value Ref Range Status   Specimen Description BLOOD BLOOD LEFT ARM  Final   Special Requests   Final    BOTTLES DRAWN AEROBIC AND ANAEROBIC Blood Culture adequate volume   Culture   Final    NO GROWTH 5 DAYS Performed at [facility]  [facility], [address] [place], [place] [address]    Report Status 02/03/2023 FINAL  Final  MRSA Next Gen by PCR, Nasal     Status: None   Collection Time: 01/29/23  1:47 PM   Specimen: Nasal Mucosa; Nasal Swab  Result Value Ref Range Status   MRSA by PCR Next Gen NOT DETECTED NOT DETECTED Final    Comment: (NOTE) The GeneXpert MRSA Assay (FDA approved for NASAL specimens only), is one component of a comprehensive MRSA colonization surveillance program. It is not intended to diagnose MRSA infection nor to guide or monitor treatment for MRSA infections. Test performance is not FDA approved in patients less than 60 years old. Performed at [facility],  [address] [place], [place] [address]   Culture, blood (Routine X 2) w Reflex to ID Panel     Status: None   Collection Time: 01/29/23  2:10 PM   Specimen: BLOOD  Result Value Ref Range Status   Specimen Description BLOOD A-LINE  Final   Special Requests   Final    BOTTLES DRAWN AEROBIC AND ANAEROBIC Blood Culture adequate volume   Culture   Final    NO GROWTH 5 DAYS Performed at [facility], [address] [place], [place] [address]    Report Status 02/03/2023 FINAL  Final    Coagulation Studies: Recent Labs    02/19/23 0602  LABPROT 14.8  INR 1.2     Urinalysis: No results for input(s): "COLORURINE", "LABSPEC", "PHURINE", "GLUCOSEU", "HGBUR", "BILIRUBINUR", "KETONESUR", "PROTEINUR", "UROBILINOGEN", "NITRITE", "LEUKOCYTESUR" in the last 72 hours.  Invalid input(s): "APPERANCEUR"     Imaging: CT ANGIO GI BLEED  Result Date: 02/19/2023 CLINICAL DATA:  Bloody bowel movement. Concern for active gastrointestinal bleeding. EXAM: CTA ABDOMEN AND PELVIS WITHOUT AND WITH CONTRAST TECHNIQUE: Multidetector CT imaging of the abdomen and pelvis was performed using the standard protocol during bolus administration of intravenous contrast. Multiplanar reconstructed images and MIPs were obtained and reviewed to evaluate the vascular anatomy. RADIATION DOSE REDUCTION: This exam was performed according to the departmental dose-optimization program which includes automated exposure control, adjustment of the mA and/or kV according to patient size and/or use of iterative reconstruction technique. CONTRAST:  20m OMNIPAQUE IOHEXOL 350 MG/ML SOLN COMPARISON:  None Available. FINDINGS: VASCULAR Aorta: Atherosclerotic calcification of the aorta. Celiac: Patent without evidence of aneurysm, dissection, vasculitis or significant stenosis. SMA: Patent without evidence of aneurysm, dissection, vasculitis or significant stenosis. Renals: Both renal arteries are patent without evidence of  aneurysm, dissection, vasculitis, fibromuscular dysplasia or significant stenosis. IMA: Patent without evidence of aneurysm, dissection, vasculitis or significant stenosis. Inflow: Patent without evidence of aneurysm, dissection, vasculitis or significant stenosis. Proximal Outflow: Aortic calcification. Atherosclerotic calcification. No aneurysm. Veins: No obvious venous abnormality within the limitations of this arterial phase study. Review of the MIP images confirms the above findings. NON-VASCULAR Lower chest: Lung bases are clear. Hepatobiliary: No focal hepatic lesion. Normal gallbladder. No biliary duct dilatation. Common bile duct is normal. Pancreas: Pancreas is normal. No ductal dilatation. No pancreatic inflammation. Spleen: Normal spleen Adrenals/urinary tract: Adrenal glands and kidneys are normal. The ureters and bladder normal. Stomach/Bowel: Stomach is normal. On delayed portal venous phase imaging there is a collection of endoluminal contrast within the second portion the duodenum (image 32/11). Findings consistent with active CT chest of bleeding. On arterial phase imaging there is a tiny focus of intraluminal contrast at this site (image 67/5). No evidence of active gastrointestinal bleeding within the small bowel. No  small bowel inflammation or dilatation. There is high-density material within the colon. No evidence of active extravasation IV contrast into the lumen of the LEFT or RIGHT colon. Rectosigmoid colon likewise unremarkable except for several diverticula. Rectum unremarkable. Vascular/Lymphatic: Abdominal aorta is normal caliber with atherosclerotic calcification. There is no retroperitoneal or periportal lymphadenopathy. No pelvic lymphadenopathy. Reproductive: Prostate unremarkable Other: Mild sarcoma of the soft tissues. Musculoskeletal: Degenerative osteophytosis of the spine. IMPRESSION: VASCULAR 1. Active gastrointestinal bleeding in the second portion duodenum evident on the  portal venous phase imaging. 2. Atherosclerotic calcification of the aorta is branches without high-grade stenosis. NON-VASCULAR 1. No evidence of bowel obstruction or inflammation. These results will be called to the ordering clinician or representative by the Radiologist Assistant, and communication documented in the PACS or [employer]. Electronically Signed   By: Suzy Bouchard M.D.   On: 02/19/2023 13:59     Medications:    sodium chloride Stopped (02/21/23 0330)   heparin 10,000 units/ 20 mL infusion syringe 500 Units/hr (02/21/23 1200)   norepinephrine (LEVOPHED) Adult infusion 7 mcg/min (02/21/23 1200)   prismasol BGK 2/2.5 dialysis solution 2,000 mL/hr at 02/21/23 1108   prismasol BGK 2/2.5 replacement solution 400 mL/hr at 02/21/23 0405   prismasol BGK 2/2.5 replacement solution 400 mL/hr at 02/21/23 0405   vasopressin 0.04 Units/min (02/21/23 1200)     vitamin C  500 mg Oral BID   Chlorhexidine Gluconate Cloth  6 each Topical Daily   feeding supplement  237 mL Oral TID BM   multivitamin  1 tablet Oral QHS   pantoprazole (PROTONIX) IV  40 mg Intravenous Q12H   heparin, mouth rinse, sodium chloride  Assessment/ Plan:  [name] is a 77 y.o. black male with chronic diastolic congestive heart failure, hypertension, COPD, hyperlipidemia, who is admitted to [facility] on 01/29/2023 for Peripheral edema [R60.9] Shock circulatory ([place]) [R57.9] Acute on chronic congestive heart failure, unspecified heart failure type ([facility]) [I50.9] Acute renal failure superimposed on chronic kidney disease, unspecified acute renal failure type, unspecified CKD stage ([place]) [N17.9, N18.9]  Acute Kidney Injury on chronic kidney disease stage IV: baseline creatinine of 2.67, GFR of 24 on 01/06/23. History of bland urine. Acute kidney injury most likely secondary to cardiorenal syndrome. Now with acute kidney injury with hyperkalemia secondary to hypovolemic shock and GI bleed. Chronic kidney disease  secondary to hypertension. Required CRRT from 2/16 - 2/20. Intermitted hemodialysis on 2/21 with hypotension and thrombosis. Did not tolerate treatment. The next day, patient said he did not want dialysis and his dialysis catheter was removed. However on 2/23, patient says he is open to dialysis if necessary.  Discontinue bicarb gtt Continue renal replacement therapy.   Acute respiratory failure and Acute exacerbation of chronic diastolic congestive heart failure and acute respiratory failure requiring intubation and mechanical ventilation.  Extubated on 02/05/23. Home regimen was torsemide '60mg'$  daily and metolazone 2.'5mg'$  twice a week.   On HFNC. Patient is DNI. Appreciate critical care input.   Hypotension with cardiogenic shock on admission. Now requiring vasopressors: norepinephrine and vasopressin. Hypotension secondary to GI bleed and volume loss.    4. Anemia of chronic kidney disease with acute blood loss Lab Results  Component Value Date   HGB 7.2 (L) 02/21/2023    Status post PRBC transfusions. With GI bleed. Appreciate GI input.     LOS: 23 John [name] 3/8/202412:20 PM

## 2023-02-21 NOTE — Progress Notes (Signed)
Nutrition Follow Up Note   DOCUMENTATION CODES:   Severe malnutrition in context of acute illness/injury  INTERVENTION:   Recommend NGT and nutrition support if patient is unable to take in sufficient oral intake  Ensure Enlive po TID, each supplement provides 350 kcal and 20 grams of protein.  Magic cup TID with meals, each supplement provides 290 kcal and 9 grams of protein  Rena-vit po daily   Vitamin C '500mg'$  po BID   Pt at high refeed risk; recommend monitor potassium, magnesium and phosphorus labs daily until stable  Daily weights   NUTRITION DIAGNOSIS:   Severe Malnutrition related to acute illness as evidenced by moderate fat depletion, severe fat depletion, moderate muscle depletion. -new diagnosis   GOAL:   Patient will meet greater than or equal to 90% of their needs -progressing   MONITOR:   PO intake, Supplement acceptance, Diet advancement, Labs, Weight trends, I & O's, Skin  ASSESSMENT:   77 y/o male with h/o COPD, CHF, HTN, CKD IV, aortic aneurysm and HLD who is admitted with circulatory shock, CHF, encephalopaty and new ESRD. Pt with cirrrhosis noted on CT scan.  -Pt s/p CRRT 2/16-2/20 -Pt s/p HD initiation 2/26, permath removed 3/4 -Pt s/p SLP eval 2/27; ok for regular diet  -Pt s/p EGD 3/6; pt found to have multiple non-bleeding ulcers.   Met with pt in room today. Pt continues to have poor oral intake but RN reports that patient did eat some of his clear liquids this morning. Pt hates the [facility] and is refusing it. Pt is asking for strawberry Ensure. Pt upgraded to a full liquid diet today. RD will add Ensure and Magic Cups. Pt is aware that recommendation is for NGT placement and nutrition support if he does not eat well and drink the Ensure. Pt wants to try and eat first. Pt remains at high refeed risk. Pt remains on CRRT.   Per chart, pt is down ~48lbs since admission. Pt -8.0L on his I & Os. Pt is down ~25lbs from his UBW. Pt with new  malnutrition.   Medications reviewed and include: vitamin C, rena-vit, protonix, heparin, levophed, vasopressin   Labs reviewed: K 3.8 wnl, BUN 41(H), creat 1.69(H), P 2.9 wnl, Mg 1.7 wnl Hgb 7.2(L), Hct 23.0(L)  Diet Order:   Diet Order             Diet full liquid Room service appropriate? Yes; Fluid consistency: Thin  Diet effective now                  EDUCATION NEEDS:   Education needs have been addressed  Skin:  Skin Assessment: Reviewed RN Assessment (Partial thickness wounds LE and right upper back; LE related to edema)  Last BM:  3/7- type 7  Height:   Ht Readings from Last 1 Encounters:  01/29/23 '5\' 11"'$  (1.803 m)    Weight:   Wt Readings from Last 1 Encounters:  02/21/23 79.7 kg    Ideal Body Weight:  78 kg  BMI:  Body mass index is 24.51 kg/m.  Estimated Nutritional Needs:   Kcal:  1800-2100kcal/day  Protein:  90-105g/day  Fluid:  1000 ml +UOP  Koleen Distance MS, RD, LDN Please refer to [facility] for RD and/or RD on-call/weekend/after hours pager

## 2023-02-21 NOTE — Consult Note (Signed)
PHARMACY CONSULT NOTE - FOLLOW UP  Pharmacy Consult for Electrolyte Monitoring and Replacement   Recent Labs: Potassium (mmol/L)  Date Value  02/21/2023 3.8   Magnesium (mg/dL)  Date Value  02/21/2023 1.7   Calcium (mg/dL)  Date Value  02/21/2023 7.9 (L)   Albumin (g/dL)  Date Value  02/21/2023 2.1 (L)   Phosphorus (mg/dL)  Date Value  02/21/2023 2.9   Sodium (mmol/L)  Date Value  02/21/2023 137     Assessment: 77 yo M with PMH HTN, CKD, CHF (EF 50-55%), AAA, CAD, COPD presents with hypovolemic shock d/t ABLA - GI bleed. Pt in AKI on CKD4, now on CRRT.   Goal of Therapy:  K >/= 4.0 and Mg >/= 2.0  Plan:  No replacement necessary at this point Phos 4.6 >> 2.9 - re-check @ 1600 with RFP Follow up electrolytes @ 1600 and tomorrow AM  Will M. Ouida Sills, PharmD PGY-1 Pharmacy Resident 02/21/2023 1:30 PM

## 2023-02-21 NOTE — Progress Notes (Signed)
Messaged Dr. Juleen China, patient venous port-hard to pull back blood prior to hooking up CRRT machine. Per MD he will place orders for Alteplase and let dwell for 60 minutes. Continue to assess.

## 2023-02-21 NOTE — Progress Notes (Signed)
NAME:  John [name], MRN:  [id number], DOB:  [birth year], LOS: 73 ADMISSION DATE:  01/29/2023, CONSULTATION DATE:  01/29/2023 REFERRING MD:  Dr. Cheri Fowler,  CHIEF COMPLAINT:  Altered Mental Status, worsening renal function   Brief Pt Description / Synopsis:  77 y.o. male admitted with shock (? Cardiogenic vs ? Septic), liver cirrhosis  Acute Hypoxic Respiratory Failure in the setting of Acute Decompensated HFpEF, and AKI on CKD Stage IV requiring intubation and mechanical ventilation, along with initiation of CRRT. Extubated, then developed acute GIB duodenal ulcers post op resp failure with severe hypovolumic shock associated with decompensated heart failure  History of Present Illness:  This is 77 year old male with a history of essential hypertension, on hydralazine and Cozaar, stage IV CKD but has not been on dialysis in the past, has chronic diastolic and systolic heart failure with cor pulmonale, moderate tricuspid regurgitation and aortic valve regurgitation, history of a ascending aortic aneurysm, coronary artery disease with atherosclerosis, COPD and chronic hypoxemia baseline 2 L/min supplemental oxygen at home with recurrent episodes of hypoxemia and remote acute exacerbation of COPD with hypoxemia and hypercapnia 2 months ago history of ruptured left quadricep tendon 2 years ago, chronic thrombocytopenia and erythrocytosis recurrent lower extremity cellulitis, recurrent hyperkalemia, morbid obesity history of anasarca dyslipidemia, recent admission for acute CHF exacerbation requiring Lasix drip and BiPAP support, who was brought in by EMS due to worsening altered mental status and circulatory shock. I was able to meet with wife at bedside during my evaluation who shares patient has been sleeping majority of each day over the last week has been on arousable and received phone call from medical provider regarding worsening kidney function which prompted ER evaluation. He had chest x-ray performed  while in the ER showing bilateral pleural effusions with vascular congestion and atelectasis/infiltrate. Patient in circulatory shock with Levophed support on admission in ER. PCCM consultation for admission to medical intensive care unit with circulatory shock consistent with acute on chronic renal failure with CHF exacerbation.    Pertinent  Medical History   Past Medical History:  Diagnosis Date   CHF (congestive heart failure) (HCC)    EF 45% in 2021   Chronic kidney disease    COPD (chronic obstructive pulmonary disease) ([place]) 02/16/2020   Hyperlipidemia    Hypertension    Pneumonia 2021    Micro Data:  2/14: Tracheal aspirate>> normal respiratory Flora 2/14: Blood culture x2>> negative 2/14: MRSA PCR>>negative 2/14: Strep pneumo & Legionella urinary antigens>> negative  Antimicrobials:   Anti-infectives (From admission, onward)    Start     Dose/Rate Route Frequency Ordered Stop   02/19/23 0645  cefTRIAXone (ROCEPHIN) 1 g in sodium chloride 0.9 % 100 mL IVPB  Status:  Discontinued        1 g 200 mL/hr over 30 Minutes Intravenous Every 24 hours 02/19/23 0556 02/20/23 0844   02/11/23 0000  ceFAZolin (ANCEF) IVPB 1 g/50 mL premix       Note to Pharmacy: Send with pt to OR   1 g 100 mL/hr over 30 Minutes Intravenous On call 02/10/23 0908 02/10/23 0904   01/31/23 1700  ceFEPIme (MAXIPIME) 2 g in sodium chloride 0.9 % 100 mL IVPB  Status:  Discontinued        2 g 200 mL/hr over 30 Minutes Intravenous Every 12 hours 01/31/23 1555 02/03/23 1048   01/30/23 1700  vancomycin (VANCOREADY) IVPB 1250 mg/250 mL  Status:  Discontinued        1,250 mg  166.7 mL/hr over 90 Minutes Intravenous Every 24 hours 01/29/23 1543 01/30/23 1036   01/30/23 1300  Ampicillin-Sulbactam (UNASYN) 3 g in sodium chloride 0.9 % 100 mL IVPB  Status:  Discontinued        3 g 200 mL/hr over 30 Minutes Intravenous Every 8 hours 01/30/23 1036 01/31/23 1118   01/30/23 0000  vancomycin variable dose per unstable  renal function (pharmacist dosing)  Status:  Discontinued         Does not apply See admin instructions 01/29/23 1125 01/29/23 1548   01/29/23 2200  piperacillin-tazobactam (ZOSYN) IVPB 3.375 g  Status:  Discontinued        3.375 g 12.5 mL/hr over 240 Minutes Intravenous Every 6 hours 01/29/23 1547 01/29/23 1548   01/29/23 2200  piperacillin-tazobactam (ZOSYN) IVPB 3.375 g  Status:  Discontinued        3.375 g 100 mL/hr over 30 Minutes Intravenous Every 6 hours 01/29/23 1548 01/30/23 1036   01/29/23 2100  piperacillin-tazobactam (ZOSYN) IVPB 2.25 g  Status:  Discontinued        2.25 g 100 mL/hr over 30 Minutes Intravenous Every 8 hours 01/29/23 1125 01/29/23 1547   01/29/23 1115  piperacillin-tazobactam (ZOSYN) IVPB 3.375 g        3.375 g 100 mL/hr over 30 Minutes Intravenous  Once 01/29/23 1103 01/29/23 1457   01/29/23 1115  vancomycin (VANCOREADY) IVPB 2000 mg/400 mL        2,000 mg 200 mL/hr over 120 Minutes Intravenous  Once 01/29/23 1104 01/29/23 1656        Significant Hospital Events: Including procedures, antibiotic start and stop dates in addition to other pertinent events   01/30/23- I met with girlfriend of patient today to review short term medical plan.  He had Echo whith RV dysfunction and concern for PE.  We have started CRRT yesterday with additional interval improvement. He will have CTPE today.  He is weaned down on pressors today on vasor 0.4 and 4 of levophed reduced from 36 this morning.  01/31/23- remains agitated intermittently overnight. Continued issues with clot burden through CRRT circuit. 02/01/23- remains agitated overnight. Requiring numerous ativan pushes. Attempting to wean down sedation. Failed SBT miserably. 02/02/23- issues with CRRT overnight. Will attempt higher UF today. Assess for possible chest tube placement.  02/03/23- No issues with CRRT overnight, currently running without complication.  CT Head obtained overnight and negative for acute process.  Off  Precedex this morning, currently somnolent, mental status currently precluding extubation. Will perform SBT as mental status permits.  Stop Cefepime, checking ammonia. 02/04/23- Remains on CRRT. Pt more awake and tracking.  Tolerated SBT for 12 hrs  02/05/23- Pt remains mechanically intubated on minimal vent settings.  Pt able to track and open eyes on command, however unable to move BLE and BLE  02/06/23-Off vasopressors.  Palliative Care consulted pt stating he DOES NOT want any form of dialysis.  If pt remains stable will transfer service to [facility]  2/26 PLaced PERM CATH for HD 2/27-status post hemodialysis today.  Doing reasonably well.  Mental status improved.  Is pretty clear with [place] that he does not want hemodialysis.  2/28 -patient has gone back and forth about wanting dialysis and not wanting dialysis.  He told the palliative team this morning that he did not want any further dialysis with him: Back to room states that he wants everything done now.  Nephrology is going to hold dialysis today and tomorrow and evaluate for Friday to see  if he needs it given that he had dialysis in the last 2 days.  The dialysis catheter was placed 2 days ago is working well and patient is healing recovering as expected per the vascular team and they are recommending outpatient follow-up. 2/29: Dialysis held today and yesterday.  Monitoring for renal recovery.  Creatinine started to rise.  Nephrology starting Lasix 80 mg IV.  3/4 removal or PERM CATH He made decision for no dialysis and as such dialysis catheter was removed  3/6 hypotension and another large hematochezia episode 3/6 EGD with bleeding ulcers, injections and clips placed POST OP RESP FAILURE AND SEVERE SHOCK TRANSFERRED TO ICU FOR HYPOVOLUMIC SHOCK  3/6 TRH discussion with patient-He reiterates NO CPR and if there is no reasonable chance of his coming off life support he would like to be made comfortable and pass peacefully   3/7 extubated, placed on  pressors for shock 3/8 remains on pressors, +shock, vasc cath placed for HD      Interim History / Subjective:  Severe shock on pressors +GIB +multiorgan failure Prognosis for meaningful recovery is minimal On CRRT   Objective   Blood pressure 107/77, pulse 72, temperature 98.3 F (36.8 C), temperature source Axillary, resp. rate 12, height '5\' 11"'$  (1.803 m), weight 79.7 kg, SpO2 100 %.        Intake/Output Summary (Last 24 hours) at 02/21/2023 0708 Last data filed at 02/21/2023 0600 Gross per 24 hour  Intake 4381.47 ml  Output 2338 ml  Net 2043.47 ml    Filed Weights   02/19/23 0802 02/20/23 0500 02/21/23 0500  Weight: 79.5 kg 82.1 kg 79.7 kg      REVIEW OF SYSTEMS  PATIENT IS UNABLE TO PROVIDE COMPLETE REVIEW OF SYSTEMS DUE TO SEVERE CRITICAL ILLNESS   PHYSICAL EXAMINATION:  GENERAL:critically ill appearing EYES: Pupils equal, round, reactive to light.  No scleral icterus.  MOUTH: Moist mucosal membrane NECK: Supple.  PULMONARY: Lungs clear to auscultation, +rhonchi,  CARDIOVASCULAR: S1 and S2.  Regular rate and rhythm GASTROINTESTINAL: Soft, nontender, -distended. Positive bowel sounds.  MUSCULOSKELETAL:  edema.  NEUROLOGIC:Lethargic SKIN:normal, warm to touch, Capillary refill delayed  Pulses present bilaterally    Assessment & Plan:  Circulatory shock  massive GIB s/p EGD in setting of LIVER CIRRHOSIS Acute decompensated HFpEF and right-sided heart failure    Acute Hypoxic Respiratory Failure needing vent support Acute kidney injury on chronic kidney disease stage IV with  Pulmonary Hypertension, & small bilateral Pleural Effusions      Acute Decompensated HFpEF & Right sided HF Hypotension likely now secondary to sedating medication/GIB Moderate Tricuspid & Aortic Regurgitation Elevated Troponin in setting of  demand ischemia Echocardiogram 01/30/23: LVEF 50055%, Grade I DD, severely reduced RV systolic function, mild to moderate TR & AR -  Vasopressors as needed to maintain MAP >65   ACUTE KIDNEY INJURY/Renal Failure progressed to  ESRD  ON HD -continue Foley Catheter-assess need -Avoid nephrotoxic agents -Follow urine output, BMP -Ensure adequate renal perfusion, optimize oxygenation -Renal dose medications   Intake/Output Summary (Last 24 hours) at 02/21/2023 0754 Last data filed at 02/21/2023 0700 Gross per 24 hour  Intake 4505.98 ml  Output 2479 ml  Net 2026.98 ml      NEUROLOGY ACUTE METABOLIC ENCEPHALOPATHY  h/o Acute metabolic encephalopathy vs possible hepatic encephalopathy   ENDO - ICU hypoglycemic\Hyperglycemia protocol -check FSBS per protocol   GI GI PROPHYLAXIS as indicated  NUTRITIONAL STATUS DIET--> as tolerated Constipation protocol as indicated   ELECTROLYTES -follow  labs as needed -replace as needed -pharmacy consultation and following     Diet/type: tubefeeds and NPO DVT prophylaxis: SCD GI prophylaxis: PPI Foley:  Yes, and it is still needed Code Status: DNR Last date of multidisciplinary goals of care discussion ALL DECISION TO BE MADE BY ATTENDING PHSYCIANS  CBC: Recent Labs  Lab 02/16/23 0403 02/17/23 0542 02/18/23 0730 02/19/23 0602 02/19/23 0840 02/19/23 1734 02/19/23 2325 02/20/23 0553 02/20/23 0646 02/20/23 1426 02/20/23 2105  WBC 9.2 7.7 6.1 9.1  --  16.0*  --   --  33.1*  --   --   NEUTROABS 7.6 6.2 4.9 7.2  --  12.4*  --   --   --   --   --   HGB 11.6* 11.8* 11.9* 9.3*   < > 7.6* 9.6* 8.3* 8.2* 7.1* 6.9*  HCT 37.1* 38.1* 37.5* 30.0*   < > 25.2* 30.0* 25.6* 25.4* 22.2* 22.4*  MCV 103.3* 103.3* 101.9* 103.1*  --  98.1  --   --  93.4  --   --   PLT 186 197 184 192  --  129*  --   --  129*  --   --    < > = values in this interval not displayed.     Basic Metabolic Panel: Recent Labs  Lab 02/17/23 0542 02/17/23 0543 02/18/23 0730 02/18/23 0731 02/19/23 0602 02/19/23 0604 02/19/23 1734 02/20/23 0553 02/20/23 0646 02/20/23 1426 02/20/23 1801  02/20/23 2307 02/21/23 0616  NA  --  139  --  135 139  --    < > 138 139 139 137 137  --   K  --  3.7  --  3.9 5.1  --    < > 6.6* 6.3* 6.2* 5.7* 4.2  --   CL  --  101  --  101 102  --    < > 106 106 106 103 101  --   CO2  --  31  --  31 32  --    < > '25 25 28 29 29  '$ --   GLUCOSE  --  105*  --  97 117*  --    < > 130* 139* 167* 211* 222*  --   BUN  --  51*  --  51* 70*  --    < > 82* 81* 79* 78* 58*  --   CREATININE  --  2.30*  --  2.11* 2.35*  --    < > 2.91* 2.82* 2.78* 2.85* 2.08*  --   CALCIUM  --  9.1  --  9.1 8.7*  --    < > 8.3* 8.2* 8.0* 7.9* 7.6*  --   MG 2.2  --  2.1  --  2.1  --   --  1.8  --   --   --   --  1.7  PHOS  --  2.9  --  3.3  --  2.4*  --  4.5  --   --  4.6  --   --    < > = values in this interval not displayed.    GFR: Estimated Creatinine Clearance: 31.7 mL/min (A) (by C-G formula based on SCr of 2.08 mg/dL (H)). Recent Labs  Lab 02/18/23 0730 02/19/23 0602 02/19/23 1734 02/20/23 0646  WBC 6.1 9.1 16.0* 33.1*     Liver Function Tests: Recent Labs  Lab 02/18/23 0731 02/19/23 0602 02/19/23 1734 02/20/23 0553 02/20/23 1801  AST  --  32  35  --   --   ALT  --  24 22  --   --   ALKPHOS  --  97 64  --   --   BILITOT  --  0.8 1.5*  --   --   PROT  --  5.1* 4.1*  --   --   ALBUMIN 2.5* 2.2* 2.2* 2.2* 2.1*    No results for input(s): "LIPASE", "AMYLASE" in the last 168 hours. No results for input(s): "AMMONIA" in the last 168 hours.   ABG    Component Value Date/Time   PHART 7.27 (L) 02/20/2023 1119   PCO2ART 58 (H) 02/20/2023 1119   PO2ART 36 (LL) 02/20/2023 1119   HCO3 26.6 02/20/2023 1119   TCO2 31 02/15/2020 1034   ACIDBASEDEF 1.4 02/20/2023 1119   O2SAT 54 02/20/2023 1119     Coagulation Profile: Recent Labs  Lab 02/19/23 0602  INR 1.2     Cardiac Enzymes: No results for input(s): "CKTOTAL", "CKMB", "CKMBINDEX", "TROPONINI" in the last 168 hours.  HbA1C: Hgb A1c MFr Bld  Date/Time Value Ref Range Status  10/02/2022 11:16 PM  6.3 (H) 4.8 - 5.6 % Final    Comment:    (NOTE) Pre diabetes:          5.7%-6.4%  Diabetes:              >6.4%  Glycemic control for   <7.0% adults with diabetes     CBG: Recent Labs  Lab 02/19/23 0952 02/20/23 2347 02/21/23 0403  GLUCAP 76 174* 125*     PROGNOSIS IS POOR HIGH CHANCE OF CARDIAC ARREST     DVT/GI PRX  assessed I Assessed the need for Labs I Assessed the need for Foley I Assessed the need for Central Venous Line Family Discussion when available I Assessed the need for Mobilization I made an Assessment of medications to be adjusted accordingly Safety Risk assessment completed  CASE DISCUSSED IN MULTIDISCIPLINARY ROUNDS WITH ICU TEAM    Critical Care Time devoted to patient care services described in this note is 55 minutes.  Critical care was necessary to treat /prevent imminent and life-threatening deterioration. Overall, patient is critically ill, prognosis is guarded.  Patient with Multiorgan failure and at high risk for cardiac arrest and death.    Corrin Parker, M.D.  [facility]  Medical Director [place] Director [facility] Cardio-Pulmonary Department

## 2023-02-22 DIAGNOSIS — R579 Shock, unspecified: Secondary | ICD-10-CM | POA: Diagnosis not present

## 2023-02-22 LAB — HEMOGLOBIN AND HEMATOCRIT, BLOOD
HCT: 24.5 % — ABNORMAL LOW (ref 39.0–52.0)
Hemoglobin: 7.6 g/dL — ABNORMAL LOW (ref 13.0–17.0)

## 2023-02-22 LAB — RENAL FUNCTION PANEL
Albumin: 2 g/dL — ABNORMAL LOW (ref 3.5–5.0)
Anion gap: 3 — ABNORMAL LOW (ref 5–15)
BUN: 22 mg/dL (ref 8–23)
CO2: 30 mmol/L (ref 22–32)
Calcium: 8.1 mg/dL — ABNORMAL LOW (ref 8.9–10.3)
Chloride: 102 mmol/L (ref 98–111)
Creatinine, Ser: 1.39 mg/dL — ABNORMAL HIGH (ref 0.61–1.24)
GFR, Estimated: 52 mL/min — ABNORMAL LOW (ref >60)
Glucose, Bld: 134 mg/dL — ABNORMAL HIGH (ref 70–99)
Phosphorus: 2.8 mg/dL (ref 2.5–4.6)
Potassium: 3.8 mmol/L (ref 3.5–5.1)
Sodium: 135 mmol/L (ref 135–145)

## 2023-02-22 LAB — GLUCOSE, CAPILLARY
Glucose-Capillary: 120 mg/dL — ABNORMAL HIGH (ref 70–99)
Glucose-Capillary: 118 mg/dL — ABNORMAL HIGH (ref 70–99)
Glucose-Capillary: 93 mg/dL (ref 70–99)
Glucose-Capillary: 90 mg/dL (ref 70–99)
Glucose-Capillary: 81 mg/dL (ref 70–99)

## 2023-02-22 LAB — PREPARE RBC (CROSSMATCH)

## 2023-02-22 LAB — MAGNESIUM: Magnesium: 1.7 mg/dL (ref 1.7–2.4)

## 2023-02-22 MED ORDER — IBUPROFEN 400 MG PO TABS
400.0000 mg | ORAL_TABLET | Freq: Once | ORAL | Status: AC
  Administered 2023-02-22: 400 mg via ORAL
  Filled 2023-02-22: qty 1

## 2023-02-22 MED ORDER — SODIUM CHLORIDE 0.9% IV SOLUTION: Freq: Once | INTRAVENOUS | Status: AC

## 2023-02-22 MED ORDER — DICLOFENAC SODIUM 1 % EX GEL
4.0000 g | Freq: Four times a day (QID) | CUTANEOUS | Status: AC
  Administered 2023-02-22 – 2023-02-23 (×3): 4 g via TOPICAL
  Filled 2023-02-22: qty 100

## 2023-02-22 MED ORDER — MAGNESIUM SULFATE 2 GM/50ML IV SOLN
2.0000 g | Freq: Once | INTRAVENOUS | Status: AC
  Administered 2023-02-22: 2 g via INTRAVENOUS
  Filled 2023-02-22: qty 50

## 2023-02-22 NOTE — Progress Notes (Signed)
CRRT machine stopped due to high filter pressures. Unable to return blood. On-Call Nephrologist Dr. Joylene John called due to his being the third time within 24 hours the filter needed to be changed without returning the blood. Verbal order given to leave the CRRT off for now. [name] currently 6.9.John [name]

## 2023-02-22 NOTE — Progress Notes (Signed)
Dr. Lanora Manis gave order to discontinue all CRRT orders including 1600 renal panels.

## 2023-02-22 NOTE — Progress Notes (Signed)
PROGRESS NOTE    John [name]   [id number] DOB: 1946/04/11  DOA: 01/29/2023 Date of Service: 02/22/23 PCP: Alisa Graff, FNP     Brief Narrative / Hospital Course:  77 year old male with a history of essential hypertension, on hydralazine and Cozaar, stage IV CKD but has not been on dialysis in the past, has chronic diastolic and systolic heart failure with cor pulmonale, moderate tricuspid regurgitation and aortic valve regurgitation, history of ascending aortic aneurysm, coronary artery disease with atherosclerosis, COPD and chronic hypoxemia baseline 2 L/min supplemental oxygen at home with recurrent episodes of hypoxemia and remote acute exacerbation of COPD with hypoxemia and hypercapnia 2 months ago history of ruptured left quadricep tendon 2 years ago, chronic thrombocytopenia and erythrocytosis recurrent lower extremity cellulitis, recurrent hyperkalemia, morbid obesity history of anasarca dyslipidemia, recent admission for acute CHF exacerbation requiring Lasix drip and BiPAP support, who was brought in by EMS due to worsening altered mental status and circulatory shock.    2/14: intubated, admitted to ICU for shock, CRRT 2/15- Echo w/ RV dysfunction and concern for PE. CTPE today.  He is weaned down on pressors on vasor 0.4 and 4 of levophed reduced from 36 this morning.  2/16- remains agitated intermittently overnight. Continued issues with clot burden through CRRT circuit. 2/17- remains agitated overnight. Requiring numerous ativan pushes. Attempting to wean down sedation. Failed SBT miserably. 2/18- issues with CRRT overnight. Will attempt higher UF today. Assess for possible chest tube placement.  2/19- No issues with CRRT overnight, currently running without complication.  CT Head obtained overnight and negative for acute process. Off Precedex this morning, somnolent. SBT as mental status permits. Stop Cefepime, checking ammonia. 2/20- Remains on CRRT. Pt more awake and  tracking.Tolerated SBT for 12 hrs  2/21- Pt remains mechanically intubated on minimal vent settings.  Pt able to track and open eyes on command, however unable to move BLE 2/22-Off vasopressors.  Palliative Care consulted pt stating he DOES NOT want any form of dialysis.  If pt remains stable will transfer service to [facility] later changed his mind on dialysis.  2/26: Placed PERM CATH for HD 2/27: status post hemodialysis.  Doing reasonably well.  Mental status improved.  Is pretty clear with [place] now that he does not want hemodialysis. 2/28 -patient has gone back and forth about wanting dialysis and not wanting dialysis.  He told the palliative team this morning that he did not want any further dialysis: Back to room states that he wants everything done now.  Nephrology is going to hold dialysis today and tomorrow and evaluate for Friday to see if he needs it given that he had dialysis in the last 2 days.  The dialysis catheter was placed 2 days ago is working well and patient is healing recovering as expected per the vascular team and they are recommending outpatient follow-up. 2/29: Dialysis held today and yesterday.  Monitoring for renal recovery. Creatinine started to rise.  Nephrology starting Lasix 80 mg IV. 3/4 removal or PERM CATH He made decision for no dialysis and as such dialysis catheter was removed  3/5: stable awaiting SNF  3/6 large volume hematochezia, duodenal bleed on CTA, EGD done, extubated post-procedure and on pressor support, s/p 3 units PRBC. States he wants everything done to try to live, except CPR 3/7 Started on CRRT. Reiterates no CPR or repeat intubation in event of respiratory distress but intubation for procedures is acceptable. OK w/ dialysis.  3/8 stable still on pressors and CRRT, more  alert and conversational today, full liquid diet, GI team s/o. Received another unit PRBC.  3/9: off CRRT d/t clotting episodes, off levophed today     Consultants:  Gastroenterology   Palliative Care  Cardiology Nephrology Vascular Surgery  ICU  Procedures: 02/14: intubation  CRRT 02/14-02/20 Emergent EGD 02/19/23 for duodenal bleed 01/29/23 Insertion of Non-tunneled Central Venous Catheter with US guidance  CRRT 03/07-03/09        ASSESSMENT & PLAN:   Principal Problem:   Shock circulatory ([place]) Active Problems:   Acute on chronic congestive heart failure ([place])   Acute hypoxic respiratory failure ([facility])   Toxic metabolic encephalopathy  Hypovolemic shock d/t ABLA - GI bleed Acute blood loss anemia on anemia of chronic disease  Trend CBC/HH and transfuse as needed  Protonix IV bid  Pressors to titrate  No anticoags  GI has s/o   Hyperkalemia - resolved Treating as needed and following BMP  Cardiogenic shock POA d/t HFpEF - improved but now hypovolemic shock d/t GI bleed and Acute decompensated HFpEF and right-sided heart failure RV dysfunction and dilation on echocardiography, concerning for RV failure and pulmonary hypertension. Porto-pulmonary hypertension on the differential given history of liver cirrhosis.  02/19/23 w/ GI bleed and hypovolemic shock he underwent aggressive fluid resuscitation and PRBC pending EGD, known risk w/ CHF and previous dialysis - monitor fluid status closely  [facility] for ultra-filtration for volume removal Cardiology had previously signed off, may need to reengage but no further diagnostics from their standpoint. Nephrology is on board we appreciate input   Acute Hypoxic Respiratory Failure d/t HFpEF and hypovolemic shock Continue supplemental O2 NO REINTUBATION if respiratory distress    Toxic Metabolic Encephalopathy - improved but waxes and wanes I judge his decision-making capacity to be limited but not absent.  See [place] discussions below   Acute kidney injury on chronic kidney disease stage IV CRRT per nephrology and intensivist teams   Hepatic cirrhosis Thrombocytopenia.   Was on cefepime empirically given  leukocytosis.  Cultures remain negative. Finished 5 days of antibiotics.  Monitor CBC, CMP  Goals of Care I judge his capacity to be limited but not absent. He appears to have a basic understanding of his situation and his options, though lethargy precludes him communicating in any great detail. He appears more alert and is more communicative 02/20/23 - 02/21/23 Intubation for procedural intervention w/ anesthesia does not violate a DNR and patient assents to this if needed  He has expressed clear general goals -  wants to do what is possible to live if medical interventions to reach this goal are judged to be futile, and/or if respiratory or cardiac distress/arrest occurs, will transition to comfort care He would like not to be kept on long term life support machines (ie if intubated for procedure and not able to wen off vent in a couple days, would like to be transitioned to comfort care)  Remains DNR I made clear to him that if he decompensates to the point that intubation is needed to maintain respiration, I do not think he would come off the ventilator - he agrees DO NOT INTUBATE IN SUCH A CASE.  I made clear to him that if he experiences severe cardiac arrhythmia or arrest, I do not think he would meaningfully recovery cardiac function despite aggressive intervention - he agrees NO CPR/DEFIBRILLATION.  in absence of ideal decision-making capacity, and in absence of a designated surrogate decision-maker or [place] other than his attending physician (in cooperation  their consulting physicians), it is ethically appropriate to treat with presumed goal of preserving life, but withholding or discontinuing treatments which are judged by 2+ physicians to be medically futile (futile = would at most only serve to delay inevitable and certain death).      DVT prophylaxis: SCD Pertinent IV fluids/nutrition: diet as tolerated  Central lines / invasive devices: HD cath L femoral, CVC R femoral,  foley   Code Status: DNR  Current Admission Status: inpatient  TOC needs / Dispo plan: SNF Barriers to discharge / significant pending items: medically unstable GI bleed --> circulatory shock, CRRT              Subjective / Brief ROS:  Patient resting comfortably   Family Communication: none at this time     Objective Findings:  Vitals:   02/22/23 1215 02/22/23 1230 02/22/23 1245 02/22/23 1315  BP: (!) 89/58 (!) 81/59 (!) 92/54 (!) 83/49  Pulse: 76 79 73 81  Resp: '17 18 18 17  '$ Temp:      TempSrc:      SpO2: 100% 98% 100% 98%  Weight:      Height:        Intake/Output Summary (Last 24 hours) at 02/22/2023 1340 Last data filed at 02/22/2023 1200 Gross per 24 hour  Intake 1708.75 ml  Output 701 ml  Net 1007.75 ml    Filed Weights   02/20/23 0500 02/21/23 0500 02/22/23 0500  Weight: 82.1 kg 79.7 kg 81.2 kg    Examination:  Physical Exam Constitutional:      General: He is not in acute distress.    Appearance: He is ill-appearing.  HENT:     Mouth/Throat:     Mouth: Mucous membranes are dry.  Pulmonary:     Effort: Pulmonary effort is normal. No respiratory distress.  Musculoskeletal:     Right lower leg: Right lower leg edema: trace.     Left lower leg: Left lower leg edema: trace.  Neurological:     Mental Status: He is alert.          Scheduled Medications:   sodium chloride   Intravenous Once   vitamin C  500 mg Oral BID   Chlorhexidine Gluconate Cloth  6 each Topical Daily   feeding supplement  237 mL Oral TID BM   multivitamin  1 tablet Oral QHS   pantoprazole (PROTONIX) IV  40 mg Intravenous Q12H    Continuous Infusions:  sodium chloride Stopped (02/21/23 0330)   heparin 10,000 units/ 20 mL infusion syringe Stopped (02/22/23 0954)   norepinephrine (LEVOPHED) Adult infusion Stopped (02/22/23 0923)   prismasol BGK 2/2.5 dialysis solution 2,000 mL/hr at 02/21/23 2105   prismasol BGK 2/2.5 replacement solution 400 mL/hr at 02/21/23  1828   prismasol BGK 2/2.5 replacement solution 400 mL/hr at 02/21/23 1828   vasopressin Stopped (02/22/23 1011)    PRN Medications:  heparin, mouth rinse, sodium chloride  Antimicrobials from admission:  Anti-infectives (From admission, onward)    Start     Dose/Rate Route Frequency Ordered Stop   02/19/23 0645  cefTRIAXone (ROCEPHIN) 1 g in sodium chloride 0.9 % 100 mL IVPB  Status:  Discontinued        1 g 200 mL/hr over 30 Minutes Intravenous Every 24 hours 02/19/23 0556 02/20/23 0844   02/11/23 0000  ceFAZolin (ANCEF) IVPB 1 g/50 mL premix       Note to Pharmacy: Send with pt to OR   1 g 100 mL/hr  over 30 Minutes Intravenous On call 02/10/23 0908 02/10/23 0904   01/31/23 1700  ceFEPIme (MAXIPIME) 2 g in sodium chloride 0.9 % 100 mL IVPB  Status:  Discontinued        2 g 200 mL/hr over 30 Minutes Intravenous Every 12 hours 01/31/23 1555 02/03/23 1048   01/30/23 1700  vancomycin (VANCOREADY) IVPB 1250 mg/250 mL  Status:  Discontinued        1,250 mg 166.7 mL/hr over 90 Minutes Intravenous Every 24 hours 01/29/23 1543 01/30/23 1036   01/30/23 1300  Ampicillin-Sulbactam (UNASYN) 3 g in sodium chloride 0.9 % 100 mL IVPB  Status:  Discontinued        3 g 200 mL/hr over 30 Minutes Intravenous Every 8 hours 01/30/23 1036 01/31/23 1118   01/30/23 0000  vancomycin variable dose per unstable renal function (pharmacist dosing)  Status:  Discontinued         Does not apply See admin instructions 01/29/23 1125 01/29/23 1548   01/29/23 2200  piperacillin-tazobactam (ZOSYN) IVPB 3.375 g  Status:  Discontinued        3.375 g 12.5 mL/hr over 240 Minutes Intravenous Every 6 hours 01/29/23 1547 01/29/23 1548   01/29/23 2200  piperacillin-tazobactam (ZOSYN) IVPB 3.375 g  Status:  Discontinued        3.375 g 100 mL/hr over 30 Minutes Intravenous Every 6 hours 01/29/23 1548 01/30/23 1036   01/29/23 2100  piperacillin-tazobactam (ZOSYN) IVPB 2.25 g  Status:  Discontinued        2.25 g 100 mL/hr  over 30 Minutes Intravenous Every 8 hours 01/29/23 1125 01/29/23 1547   01/29/23 1115  piperacillin-tazobactam (ZOSYN) IVPB 3.375 g        3.375 g 100 mL/hr over 30 Minutes Intravenous  Once 01/29/23 1103 01/29/23 1457   01/29/23 1115  vancomycin (VANCOREADY) IVPB 2000 mg/400 mL        2,000 mg 200 mL/hr over 120 Minutes Intravenous  Once 01/29/23 1104 01/29/23 1656           Data Reviewed:  I have personally reviewed the following...  CBC: Recent Labs  Lab 02/16/23 0403 02/17/23 0542 02/18/23 0730 02/19/23 0602 02/19/23 0840 02/19/23 1734 02/19/23 2325 02/20/23 0646 02/20/23 1426 02/20/23 2105 02/21/23 1001 02/21/23 1613 02/21/23 2305 02/22/23 0600  WBC 9.2 7.7 6.1 9.1  --  16.0*  --  33.1*  --   --   --   --   --   --   NEUTROABS 7.6 6.2 4.9 7.2  --  12.4*  --   --   --   --   --   --   --   --   HGB 11.6* 11.8* 11.9* 9.3*   < > 7.6*   < > 8.2*   < > 6.9* 7.2* 6.3* 6.9* 7.6*  HCT 37.1* 38.1* 37.5* 30.0*   < > 25.2*   < > 25.4*   < > 22.4* 23.0* 19.8* 22.0* 24.5*  MCV 103.3* 103.3* 101.9* 103.1*  --  98.1  --  93.4  --   --   --   --   --   --   PLT 186 197 184 192  --  129*  --  129*  --   --   --   --   --   --    < > = values in this interval not displayed.    Basic Metabolic Panel: Recent Labs  Lab 02/18/23 0730 02/18/23 0731 02/19/23  0602 02/19/23 0604 02/20/23 0553 02/20/23 [id number] 02/20/23 1801 02/20/23 2307 02/21/23 0616 02/21/23 0617 02/21/23 1613 02/22/23 0600  NA  --    < > 139   < > 138   < > 137 137  --  137 135 135  K  --    < > 5.1   < > 6.6*   < > 5.7* 4.2  --  3.8 4.0 3.8  CL  --    < > 102   < > 106   < > 103 101  --  100 101 102  CO2  --    < > 32   < > 25   < > 29 29  --  '31 30 30  '$ GLUCOSE  --    < > 117*   < > 130*   < > 211* 222*  --  161* 142* 134*  BUN  --    < > 70*   < > 82*   < > 78* 58*  --  41* 29* 22  CREATININE  --    < > 2.35*   < > 2.91*   < > 2.85* 2.08*  --  1.69* 1.48* 1.39*  CALCIUM  --    < > 8.7*   < > 8.3*   < >  7.9* 7.6*  --  7.9* 7.8* 8.1*  MG 2.1  --  2.1  --  1.8  --   --   --  1.7  --   --  1.7  PHOS  --    < >  --    < > 4.5  --  4.6  --   --  2.9 2.6 2.8   < > = values in this interval not displayed.    GFR: Estimated Creatinine Clearance: 47.4 mL/min (A) (by C-G formula based on SCr of 1.39 mg/dL (H)). Liver Function Tests: Recent Labs  Lab 02/19/23 0602 02/19/23 1734 02/20/23 0553 02/20/23 1801 02/21/23 0617 02/21/23 1613 02/22/23 0600  AST 32 35  --   --   --   --   --   ALT 24 22  --   --   --   --   --   ALKPHOS 97 64  --   --   --   --   --   BILITOT 0.8 1.5*  --   --   --   --   --   PROT 5.1* 4.1*  --   --   --   --   --   ALBUMIN 2.2* 2.2* 2.2* 2.1* 2.1* 1.8* 2.0*    No results for input(s): "LIPASE", "AMYLASE" in the last 168 hours. No results for input(s): "AMMONIA" in the last 168 hours. Coagulation Profile: Recent Labs  Lab 02/19/23 0602  INR 1.2    Cardiac Enzymes: No results for input(s): "CKTOTAL", "CKMB", "CKMBINDEX", "TROPONINI" in the last 168 hours. BNP (last 3 results) No results for input(s): "PROBNP" in the last 8760 hours. HbA1C: No results for input(s): "HGBA1C" in the last 72 hours. CBG: Recent Labs  Lab 02/21/23 1918 02/21/23 2311 02/22/23 0343 02/22/23 0742 02/22/23 1138  GLUCAP 113* 131* 120* 118* 93    Lipid Profile: No results for input(s): "CHOL", "HDL", "LDLCALC", "TRIG", "CHOLHDL", "LDLDIRECT" in the last 72 hours. Thyroid Function Tests: No results for input(s): "TSH", "T4TOTAL", "FREET4", "T3FREE", "THYROIDAB" in the last 72 hours. Anemia Panel: No results for input(s): "VITAMINB12", "FOLATE", "FERRITIN", "TIBC", "IRON", "RETICCTPCT" in  the last 72 hours. Most Recent Urinalysis On File:     Component Value Date/Time   COLORURINE YELLOW (A) 01/29/2023 2227   APPEARANCEUR HAZY (A) 01/29/2023 2227   LABSPEC 1.014 01/29/2023 2227   PHURINE 5.0 01/29/2023 2227   GLUCOSEU NEGATIVE 01/29/2023 2227   HGBUR MODERATE (A)  01/29/2023 2227   BILIRUBINUR NEGATIVE 01/29/2023 2227   KETONESUR 5 (A) 01/29/2023 2227   PROTEINUR NEGATIVE 01/29/2023 2227   NITRITE NEGATIVE 01/29/2023 2227   LEUKOCYTESUR TRACE (A) 01/29/2023 2227   Sepsis Labs: '@LABRCNTIP'$ (procalcitonin:4,lacticidven:4) Microbiology: No results found for this or any previous visit (from the past 240 hour(s)).    Radiology Studies last 3 days: CT ANGIO GI BLEED  Result Date: 02/19/2023 CLINICAL DATA:  Bloody bowel movement. Concern for active gastrointestinal bleeding. EXAM: CTA ABDOMEN AND PELVIS WITHOUT AND WITH CONTRAST TECHNIQUE: Multidetector CT imaging of the abdomen and pelvis was performed using the standard protocol during bolus administration of intravenous contrast. Multiplanar reconstructed images and MIPs were obtained and reviewed to evaluate the vascular anatomy. RADIATION DOSE REDUCTION: This exam was performed according to the departmental dose-optimization program which includes automated exposure control, adjustment of the mA and/or kV according to patient size and/or use of iterative reconstruction technique. CONTRAST:  75m OMNIPAQUE IOHEXOL 350 MG/ML SOLN COMPARISON:  None Available. FINDINGS: VASCULAR Aorta: Atherosclerotic calcification of the aorta. Celiac: Patent without evidence of aneurysm, dissection, vasculitis or significant stenosis. SMA: Patent without evidence of aneurysm, dissection, vasculitis or significant stenosis. Renals: Both renal arteries are patent without evidence of aneurysm, dissection, vasculitis, fibromuscular dysplasia or significant stenosis. IMA: Patent without evidence of aneurysm, dissection, vasculitis or significant stenosis. Inflow: Patent without evidence of aneurysm, dissection, vasculitis or significant stenosis. Proximal Outflow: Aortic calcification. Atherosclerotic calcification. No aneurysm. Veins: No obvious venous abnormality within the limitations of this arterial phase study. Review of the MIP  images confirms the above findings. NON-VASCULAR Lower chest: Lung bases are clear. Hepatobiliary: No focal hepatic lesion. Normal gallbladder. No biliary duct dilatation. Common bile duct is normal. Pancreas: Pancreas is normal. No ductal dilatation. No pancreatic inflammation. Spleen: Normal spleen Adrenals/urinary tract: Adrenal glands and kidneys are normal. The ureters and bladder normal. Stomach/Bowel: Stomach is normal. On delayed portal venous phase imaging there is a collection of endoluminal contrast within the second portion the duodenum (image 32/11). Findings consistent with active CT chest of bleeding. On arterial phase imaging there is a tiny focus of intraluminal contrast at this site (image 67/5). No evidence of active gastrointestinal bleeding within the small bowel. No small bowel inflammation or dilatation. There is high-density material within the colon. No evidence of active extravasation IV contrast into the lumen of the LEFT or RIGHT colon. Rectosigmoid colon likewise unremarkable except for several diverticula. Rectum unremarkable. Vascular/Lymphatic: Abdominal aorta is normal caliber with atherosclerotic calcification. There is no retroperitoneal or periportal lymphadenopathy. No pelvic lymphadenopathy. Reproductive: Prostate unremarkable Other: Mild sarcoma of the soft tissues. Musculoskeletal: Degenerative osteophytosis of the spine. IMPRESSION: VASCULAR 1. Active gastrointestinal bleeding in the second portion duodenum evident on the portal venous phase imaging. 2. Atherosclerotic calcification of the aorta is branches without high-grade stenosis. NON-VASCULAR 1. No evidence of bowel obstruction or inflammation. These results will be called to the ordering clinician or representative by the Radiologist Assistant, and communication documented in the PACS or [employer] Electronically Signed   By: SSuzy BouchardM.D.   On: 02/19/2023 13:59             LOS: 24 days  Emeterio Reeve, DO [facility] 02/22/2023, 1:40 PM    Dictation software may have been used to generate the above note. Typos may occur and escape review in typed/dictated notes. Please contact Dr Sheppard Coil directly for clarity if needed.  Staff may message me via secure chat in [place]  but this may not receive an immediate response,  please page me for urgent matters!  If 7PM-7AM, please contact night coverage [email or web address]

## 2023-02-22 NOTE — Progress Notes (Signed)
[name]  PROGRESS NOTE   Subjective:   Patient seen at bedside awake and alert.  CRRT discontinued secondary to clotting episodes. He is now extubated.  His output was close to 1 L last night.  Hemoglobin has improved after transfusion.  He is now off of Levophed.  Objective:  Vital signs: Blood pressure (!) 84/50, pulse 75, temperature (!) 97.4 F (36.3 C), temperature source Oral, resp. rate (!) 25, height '5\' 11"'$  (1.803 m), weight 81.2 kg, SpO2 99 %.  Intake/Output Summary (Last 24 hours) at 02/22/2023 1127 Last data filed at 02/22/2023 1000 Gross per 24 hour  Intake 1695.58 ml  Output 828 ml  Net 867.58 ml   Filed Weights   02/20/23 0500 02/21/23 0500 02/22/23 0500  Weight: 82.1 kg 79.7 kg 81.2 kg     Physical Exam: General:  No acute distress  Head:  Normocephalic, atraumatic. Moist oral mucosal membranes  Eyes:  Anicteric  Neck:  Supple  Lungs:   Clear to auscultation, normal effort  Heart:  S1S2 no rubs  Abdomen:   Soft, nontender, bowel sounds present  Extremities:  peripheral edema.  Neurologic:  Awake, alert, following commands  Skin:  No lesions  Access:     Basic Metabolic Panel: Recent Labs  Lab 02/18/23 0730 02/18/23 0731 02/19/23 0602 02/19/23 0604 02/20/23 0553 02/20/23 [id number] 02/20/23 1801 02/20/23 2307 02/21/23 0616 02/21/23 0617 02/21/23 1613 02/22/23 0600  NA  --    < > 139   < > 138   < > 137 137  --  137 135 135  K  --    < > 5.1   < > 6.6*   < > 5.7* 4.2  --  3.8 4.0 3.8  CL  --    < > 102   < > 106   < > 103 101  --  100 101 102  CO2  --    < > 32   < > 25   < > 29 29  --  '31 30 30  '$ GLUCOSE  --    < > 117*   < > 130*   < > 211* 222*  --  161* 142* 134*  BUN  --    < > 70*   < > 82*   < > 78* 58*  --  41* 29* 22  CREATININE  --    < > 2.35*   < > 2.91*   < > 2.85* 2.08*  --  1.69* 1.48* 1.39*  CALCIUM  --    < > 8.7*   < > 8.3*   < > 7.9* 7.6*  --  7.9* 7.8* 8.1*  MG 2.1  --  2.1  --  1.8  --   --   --  1.7  --   --  1.7   PHOS  --    < >  --    < > 4.5  --  4.6  --   --  2.9 2.6 2.8   < > = values in this interval not displayed.   GFR: Estimated Creatinine Clearance: 47.4 mL/min (A) (by C-G formula based on SCr of 1.39 mg/dL (H)).  Liver Function Tests: Recent Labs  Lab 02/19/23 0602 02/19/23 1734 02/20/23 0553 02/20/23 1801 02/21/23 0617 02/21/23 1613 02/22/23 0600  AST 32 35  --   --   --   --   --   ALT 24 22  --   --   --   --   --  ALKPHOS 97 64  --   --   --   --   --   BILITOT 0.8 1.5*  --   --   --   --   --   PROT 5.1* 4.1*  --   --   --   --   --   ALBUMIN 2.2* 2.2* 2.2* 2.1* 2.1* 1.8* 2.0*   No results for input(s): "LIPASE", "AMYLASE" in the last 168 hours. No results for input(s): "AMMONIA" in the last 168 hours.  CBC: Recent Labs  Lab 02/16/23 0403 02/17/23 0542 02/18/23 0730 02/19/23 0602 02/19/23 0840 02/19/23 1734 02/19/23 2325 02/20/23 0646 02/20/23 1426 02/20/23 2105 02/21/23 1001 02/21/23 1613 02/21/23 2305 02/22/23 0600  WBC 9.2 7.7 6.1 9.1  --  16.0*  --  33.1*  --   --   --   --   --   --   NEUTROABS 7.6 6.2 4.9 7.2  --  12.4*  --   --   --   --   --   --   --   --   HGB 11.6* 11.8* 11.9* 9.3*   < > 7.6*   < > 8.2*   < > 6.9* 7.2* 6.3* 6.9* 7.6*  HCT 37.1* 38.1* 37.5* 30.0*   < > 25.2*   < > 25.4*   < > 22.4* 23.0* 19.8* 22.0* 24.5*  MCV 103.3* 103.3* 101.9* 103.1*  --  98.1  --  93.4  --   --   --   --   --   --   PLT 186 197 184 192  --  129*  --  129*  --   --   --   --   --   --    < > = values in this interval not displayed.     HbA1C: Hgb A1c MFr Bld  Date/Time Value Ref Range Status  10/02/2022 11:16 PM 6.3 (H) 4.8 - 5.6 % Final    Comment:    (NOTE) Pre diabetes:          5.7%-6.4%  Diabetes:              >6.4%  Glycemic control for   <7.0% adults with diabetes     Urinalysis: No results for input(s): "COLORURINE", "LABSPEC", "PHURINE", "GLUCOSEU", "HGBUR", "BILIRUBINUR", "KETONESUR", "PROTEINUR", "UROBILINOGEN", "NITRITE",  "LEUKOCYTESUR" in the last 72 hours.  Invalid input(s): "APPERANCEUR"    Imaging: No results found.   Medications:    sodium chloride Stopped (02/21/23 0330)   heparin 10,000 units/ 20 mL infusion syringe Stopped (02/22/23 0954)   magnesium sulfate bolus IVPB 2 g (02/22/23 1101)   norepinephrine (LEVOPHED) Adult infusion Stopped (02/22/23 0923)   prismasol BGK 2/2.5 dialysis solution 2,000 mL/hr at 02/21/23 2105   prismasol BGK 2/2.5 replacement solution 400 mL/hr at 02/21/23 1828   prismasol BGK 2/2.5 replacement solution 400 mL/hr at 02/21/23 1828   vasopressin 0.03 Units/min (02/22/23 1000)    sodium chloride   Intravenous Once   vitamin C  500 mg Oral BID   Chlorhexidine Gluconate Cloth  6 each Topical Daily   feeding supplement  237 mL Oral TID BM   multivitamin  1 tablet Oral QHS   pantoprazole (PROTONIX) IV  40 mg Intravenous Q12H    Assessment/ Plan:     77 y.o. black male with chronic diastolic congestive heart failure, hypertension, COPD, hyperlipidemia, who is admitted to [facility] on 01/29/2023 for Peripheral edema [R60.9] Shock circulatory ([place]) [id number]]  Acute on chronic congestive heart failure, unspecified heart failure type ([place]) [I50.9] Acute renal failure superimposed on chronic kidney disease, unspecified acute renal failure type, unspecified CKD stage ([place]) [N17.9, N18.9]  #1: Acute kidney injury: Patient has history of chronic kidney disease.  Presently the GFR has been improving and urine output is close to 1 L yesterday.  He had multiple clotting episodes with CRRT lines and also became severely anemic requiring transfusion.  Presently he is off of CRRT.  Will continue to monitor closely.  #2 acute respiratory failure requiring intubation: Patient is now extubated and awake.  #3: Hypotension: Patient had cardiogenic shock requiring pressors.  Still on vasopressin at a small dose.  He is now off of Levophed.  Will continue to monitor closely.  #4: Anemia:  Patient had GI bleed and required multiple transfusions.  Will continue to monitor closely.  Case discussed with ICU attending.      LOS: [place], [facility] 3/9/202411:27 AM

## 2023-02-22 NOTE — Progress Notes (Signed)
NAME:  Shohei Foglia, MRN:  [id number], DOB:  [birth year], LOS: 24 ADMISSION DATE:  01/29/2023, CONSULTATION DATE:  01/29/2023 REFERRING MD:  Dr. Cheri Fowler,  CHIEF COMPLAINT:  Altered Mental Status, worsening renal function   Brief Pt Description / Synopsis:  77 y.o. male admitted with shock (? Cardiogenic vs ? Septic), liver cirrhosis  Acute Hypoxic Respiratory Failure in the setting of Acute Decompensated HFpEF, and AKI on CKD Stage IV requiring intubation and mechanical ventilation, along with initiation of CRRT. Extubated, then developed acute GIB duodenal ulcers post op resp failure with severe hypovolumic shock associated with decompensated heart failure  History of Present Illness:  This is 77 year old male with a history of essential hypertension, on hydralazine and Cozaar, stage IV CKD but has not been on dialysis in the past, has chronic diastolic and systolic heart failure with cor pulmonale, moderate tricuspid regurgitation and aortic valve regurgitation, history of a ascending aortic aneurysm, coronary artery disease with atherosclerosis, COPD and chronic hypoxemia baseline 2 L/min supplemental oxygen at home with recurrent episodes of hypoxemia and remote acute exacerbation of COPD with hypoxemia and hypercapnia 2 months ago history of ruptured left quadricep tendon 2 years ago, chronic thrombocytopenia and erythrocytosis recurrent lower extremity cellulitis, recurrent hyperkalemia, morbid obesity history of anasarca dyslipidemia, recent admission for acute CHF exacerbation requiring Lasix drip and BiPAP support, who was brought in by EMS due to worsening altered mental status and circulatory shock. I was able to meet with wife at bedside during my evaluation who shares patient has been sleeping majority of each day over the last week has been on arousable and received phone call from medical provider regarding worsening kidney function which prompted ER evaluation. He had chest x-ray performed  while in the ER showing bilateral pleural effusions with vascular congestion and atelectasis/infiltrate. Patient in circulatory shock with Levophed support on admission in ER. PCCM consultation for admission to medical intensive care unit with circulatory shock consistent with acute on chronic renal failure with CHF exacerbation.    Pertinent  Medical History   Past Medical History:  Diagnosis Date   CHF (congestive heart failure) (HCC)    EF 45% in 2021   Chronic kidney disease    COPD (chronic obstructive pulmonary disease) ([place]) 02/16/2020   Hyperlipidemia    Hypertension    Pneumonia 2021    Micro Data:  2/14: Tracheal aspirate>> normal respiratory [place] 2/14: Blood culture x2>> negative 2/14: MRSA PCR>>negative 2/14: Strep pneumo & Legionella urinary antigens>> negative  Antimicrobials:   Anti-infectives (From admission, onward)    Start     Dose/Rate Route Frequency Ordered Stop   02/19/23 0645  cefTRIAXone (ROCEPHIN) 1 g in sodium chloride 0.9 % 100 mL IVPB  Status:  Discontinued        1 g 200 mL/hr over 30 Minutes Intravenous Every 24 hours 02/19/23 0556 02/20/23 0844   02/11/23 0000  ceFAZolin (ANCEF) IVPB 1 g/50 mL premix       Note to Pharmacy: Send with pt to OR   1 g 100 mL/hr over 30 Minutes Intravenous On call 02/10/23 0908 02/10/23 0904   01/31/23 1700  ceFEPIme (MAXIPIME) 2 g in sodium chloride 0.9 % 100 mL IVPB  Status:  Discontinued        2 g 200 mL/hr over 30 Minutes Intravenous Every 12 hours 01/31/23 1555 02/03/23 1048   01/30/23 1700  vancomycin (VANCOREADY) IVPB 1250 mg/250 mL  Status:  Discontinued        1,250 mg  166.7 mL/hr over 90 Minutes Intravenous Every 24 hours 01/29/23 1543 01/30/23 1036   01/30/23 1300  Ampicillin-Sulbactam (UNASYN) 3 g in sodium chloride 0.9 % 100 mL IVPB  Status:  Discontinued        3 g 200 mL/hr over 30 Minutes Intravenous Every 8 hours 01/30/23 1036 01/31/23 1118   01/30/23 0000  vancomycin variable dose per unstable  renal function (pharmacist dosing)  Status:  Discontinued         Does not apply See admin instructions 01/29/23 1125 01/29/23 1548   01/29/23 2200  piperacillin-tazobactam (ZOSYN) IVPB 3.375 g  Status:  Discontinued        3.375 g 12.5 mL/hr over 240 Minutes Intravenous Every 6 hours 01/29/23 1547 01/29/23 1548   01/29/23 2200  piperacillin-tazobactam (ZOSYN) IVPB 3.375 g  Status:  Discontinued        3.375 g 100 mL/hr over 30 Minutes Intravenous Every 6 hours 01/29/23 1548 01/30/23 1036   01/29/23 2100  piperacillin-tazobactam (ZOSYN) IVPB 2.25 g  Status:  Discontinued        2.25 g 100 mL/hr over 30 Minutes Intravenous Every 8 hours 01/29/23 1125 01/29/23 1547   01/29/23 1115  piperacillin-tazobactam (ZOSYN) IVPB 3.375 g        3.375 g 100 mL/hr over 30 Minutes Intravenous  Once 01/29/23 1103 01/29/23 1457   01/29/23 1115  vancomycin (VANCOREADY) IVPB 2000 mg/400 mL        2,000 mg 200 mL/hr over 120 Minutes Intravenous  Once 01/29/23 1104 01/29/23 1656        Significant Hospital Events: Including procedures, antibiotic start and stop dates in addition to other pertinent events   01/30/23- I met with girlfriend of patient today to review short term medical plan.  He had Echo whith RV dysfunction and concern for PE.  We have started CRRT yesterday with additional interval improvement. He will have CTPE today.  He is weaned down on pressors today on vasor 0.4 and 4 of levophed reduced from 36 this morning.  01/31/23- remains agitated intermittently overnight. Continued issues with clot burden through CRRT circuit. 02/01/23- remains agitated overnight. Requiring numerous ativan pushes. Attempting to wean down sedation. Failed SBT miserably. 02/02/23- issues with CRRT overnight. Will attempt higher UF today. Assess for possible chest tube placement.  02/03/23- No issues with CRRT overnight, currently running without complication.  CT Head obtained overnight and negative for acute process.  Off  Precedex this morning, currently somnolent, mental status currently precluding extubation. Will perform SBT as mental status permits.  Stop Cefepime, checking ammonia. 02/04/23- Remains on CRRT. Pt more awake and tracking.  Tolerated SBT for 12 hrs  02/05/23- Pt remains mechanically intubated on minimal vent settings.  Pt able to track and open eyes on command, however unable to move BLE and BLE  02/06/23-Off vasopressors.  Palliative Care consulted pt stating he DOES NOT want any form of dialysis.  If pt remains stable will transfer service to [facility]  2/26 PLaced PERM CATH for HD 2/27-status post hemodialysis today.  Doing reasonably well.  Mental status improved.  Is pretty clear with [place] that he does not want hemodialysis.  2/28 -patient has gone back and forth about wanting dialysis and not wanting dialysis.  He told the palliative team this morning that he did not want any further dialysis with him: Back to room states that he wants everything done now.  Nephrology is going to hold dialysis today and tomorrow and evaluate for Friday to see  if he needs it given that he had dialysis in the last 2 days.  The dialysis catheter was placed 2 days ago is working well and patient is healing recovering as expected per the vascular team and they are recommending outpatient follow-up. 2/29: Dialysis held today and yesterday.  Monitoring for renal recovery.  Creatinine started to rise.  Nephrology starting Lasix 80 mg IV.  3/4 removal or PERM CATH He made decision for no dialysis and as such dialysis catheter was removed  3/6 hypotension and another large hematochezia episode 3/6 EGD with bleeding ulcers, injections and clips placed POST OP RESP FAILURE AND SEVERE SHOCK TRANSFERRED TO ICU FOR HYPOVOLUMIC SHOCK  3/6 TRH discussion with patient-He reiterates NO CPR and if there is no reasonable chance of his coming off life support he would like to be made comfortable and pass peacefully   3/7 extubated, placed on  pressors for shock 3/8 remains on pressors, +shock, vasc cath placed for HD 3/8 CRRT clotted off requiring transfusion 3/9 remains on pressors, CRRT clotted, DNR/DNI      Interim History / Subjective:  + shock on pressors +GIB CRRT machine clotting PROGNOSIS IF GRIM DNR/DNI    Objective   Blood pressure 94/60, pulse 72, temperature 98.1 F (36.7 C), temperature source Oral, resp. rate 19, height '5\' 11"'$  (1.803 m), weight 81.2 kg, SpO2 97 %.        Intake/Output Summary (Last 24 hours) at 02/22/2023 0717 Last data filed at 02/22/2023 0600 Gross per 24 hour  Intake 2197.43 ml  Output 1305 ml  Net 892.43 ml    Filed Weights   02/20/23 0500 02/21/23 0500 02/22/23 0500  Weight: 82.1 kg 79.7 kg 81.2 kg       Review of Systems: Gen:  Denies  fever, sweats, chills weight loss  HEENT: Denies blurred vision, double vision, ear pain, eye pain, hearing loss, nose bleeds, sore throat Cardiac:  No dizziness, chest pain or heaviness, chest tightness,edema, No JVD Resp:   No cough, -sputum production, -shortness of breath,-wheezing, -hemoptysis,  Other:  All other systems negative   Physical Examination:   General Appearance: No distress  EYES PERRLA, EOM intact.   NECK Supple, No JVD Pulmonary: normal breath sounds, No wheezing.  CardiovascularNormal S1,S2.  No m/r/g.   Abdomen: Benign, Soft, non-tender. Neurology UE/LE 5/5 strength, no focal deficits Ext pulses intact, cap refill intact ALL OTHER ROS ARE NEGATIVE    Assessment & Plan:  Circulatory shock  massive GIB s/p EGD in setting of LIVER CIRRHOSIS Acute decompensated HFpEF and right-sided heart failure    Acute Hypoxic Respiratory Failure needing vent support Acute kidney injury on chronic kidney disease stage IV with  Pulmonary Hypertension, & small bilateral Pleural Effusions     ACUTE SYSTOLIC CARDIAC FAILURE- Decompensated HFpEF & Right sided HF -oxygen as needed Moderate Tricuspid & Aortic  Regurgitation Elevated Troponin in setting of  demand ischemia Echocardiogram 01/30/23: LVEF 50055%, Grade I DD, severely reduced RV systolic function, mild to moderate TR & AR - Vasopressors as needed to maintain MAP >65   ACUTE KIDNEY INJURY/Renal Failure -continue Foley Catheter-assess need -Avoid nephrotoxic agents -Follow urine output, BMP -Ensure adequate renal perfusion, optimize oxygenation -Renal dose medications Patient NOT tolerating CRRT  Intake/Output Summary (Last 24 hours) at 02/22/2023 0721 Last data filed at 02/22/2023 0600 Gross per 24 hour  Intake 2197.43 ml  Output 1305 ml  Net 892.43 ml     NEUROLOGY INTERMITTENT  METABOLIC ENCEPHALOPATHY  ENDO - ICU hypoglycemic\Hyperglycemia protocol -check FSBS per protocol   GI GI PROPHYLAXIS as indicated  NUTRITIONAL STATUS DIET--> as tolerated Constipation protocol as indicated   ELECTROLYTES -follow labs as needed -replace as needed -pharmacy consultation and following      Diet/type: tubefeeds and NPO DVT prophylaxis: SCD GI prophylaxis: PPI Foley:  Yes, and it is still needed Code Status: DNR Last date of multidisciplinary goals of care discussion ALL DECISION TO BE MADE BY ATTENDING PHSYCIANS  CBC: Recent Labs  Lab 02/16/23 0403 02/17/23 0542 02/18/23 0730 02/19/23 0602 02/19/23 0840 02/19/23 1734 02/19/23 2325 02/20/23 0646 02/20/23 1426 02/20/23 2105 02/21/23 1001 02/21/23 1613 02/21/23 2305 02/22/23 0600  WBC 9.2 7.7 6.1 9.1  --  16.0*  --  33.1*  --   --   --   --   --   --   NEUTROABS 7.6 6.2 4.9 7.2  --  12.4*  --   --   --   --   --   --   --   --   HGB 11.6* 11.8* 11.9* 9.3*   < > 7.6*   < > 8.2*   < > 6.9* 7.2* 6.3* 6.9* 7.6*  HCT 37.1* 38.1* 37.5* 30.0*   < > 25.2*   < > 25.4*   < > 22.4* 23.0* 19.8* 22.0* 24.5*  MCV 103.3* 103.3* 101.9* 103.1*  --  98.1  --  93.4  --   --   --   --   --   --   PLT 186 197 184 192  --  129*  --  129*  --   --   --   --   --   --    <  > = values in this interval not displayed.     Basic Metabolic Panel: Recent Labs  Lab 02/18/23 0730 02/18/23 0731 02/19/23 0602 02/19/23 0604 02/20/23 0553 02/20/23 [id number] 02/20/23 1801 02/20/23 2307 02/21/23 0616 02/21/23 0617 02/21/23 1613 02/22/23 0600  NA  --    < > 139   < > 138   < > 137 137  --  137 135 135  K  --    < > 5.1   < > 6.6*   < > 5.7* 4.2  --  3.8 4.0 3.8  CL  --    < > 102   < > 106   < > 103 101  --  100 101 102  CO2  --    < > 32   < > 25   < > 29 29  --  '31 30 30  '$ GLUCOSE  --    < > 117*   < > 130*   < > 211* 222*  --  161* 142* 134*  BUN  --    < > 70*   < > 82*   < > 78* 58*  --  41* 29* 22  CREATININE  --    < > 2.35*   < > 2.91*   < > 2.85* 2.08*  --  1.69* 1.48* 1.39*  CALCIUM  --    < > 8.7*   < > 8.3*   < > 7.9* 7.6*  --  7.9* 7.8* 8.1*  MG 2.1  --  2.1  --  1.8  --   --   --  1.7  --   --  1.7  PHOS  --    < >  --    < >  4.5  --  4.6  --   --  2.9 2.6 2.8   < > = values in this interval not displayed.    GFR: Estimated Creatinine Clearance: 47.4 mL/min (A) (by C-G formula based on SCr of 1.39 mg/dL (H)). Recent Labs  Lab 02/18/23 0730 02/19/23 0602 02/19/23 1734 02/20/23 0646  WBC 6.1 9.1 16.0* 33.1*     Liver Function Tests: Recent Labs  Lab 02/19/23 0602 02/19/23 1734 02/20/23 0553 02/20/23 1801 02/21/23 0617 02/21/23 1613 02/22/23 0600  AST 32 35  --   --   --   --   --   ALT 24 22  --   --   --   --   --   ALKPHOS 97 64  --   --   --   --   --   BILITOT 0.8 1.5*  --   --   --   --   --   PROT 5.1* 4.1*  --   --   --   --   --   ALBUMIN 2.2* 2.2* 2.2* 2.1* 2.1* 1.8* 2.0*    No results for input(s): "LIPASE", "AMYLASE" in the last 168 hours. No results for input(s): "AMMONIA" in the last 168 hours.   ABG    Component Value Date/Time   PHART 7.27 (L) 02/20/2023 1119   PCO2ART 58 (H) 02/20/2023 1119   PO2ART 36 (LL) 02/20/2023 1119   HCO3 26.6 02/20/2023 1119   TCO2 31 02/15/2020 1034   ACIDBASEDEF 1.4 02/20/2023  1119   O2SAT 54 02/20/2023 1119     Coagulation Profile: Recent Labs  Lab 02/19/23 0602  INR 1.2     Cardiac Enzymes: No results for input(s): "CKTOTAL", "CKMB", "CKMBINDEX", "TROPONINI" in the last 168 hours.  HbA1C: Hgb A1c MFr Bld  Date/Time Value Ref Range Status  10/02/2022 11:16 PM 6.3 (H) 4.8 - 5.6 % Final    Comment:    (NOTE) Pre diabetes:          5.7%-6.4%  Diabetes:              >6.4%  Glycemic control for   <7.0% adults with diabetes     CBG: Recent Labs  Lab 02/21/23 1609 02/21/23 1611 02/21/23 1918 02/21/23 2311 02/22/23 0343  GLUCAP 65* 135* 113* 131* 120*     PROGNOSIS IS POOR HIGH CHANCE OF CARDIAC ARREST     DVT/GI PRX  assessed I Assessed the need for Labs I Assessed the need for Foley I Assessed the need for Central Venous Line Family Discussion when available I Assessed the need for Mobilization I made an Assessment of medications to be adjusted accordingly Safety Risk assessment completed  CASE DISCUSSED IN MULTIDISCIPLINARY ROUNDS WITH ICU TEAM     Critical Care Time devoted to patient care services described in this note is 55 minutes.  Critical care was necessary to treat /prevent imminent and life-threatening deterioration. Overall, patient is critically ill, prognosis is guarded.  Patient with Multiorgan failure and at high risk for cardiac arrest and death.    Corrin Parker, M.D.  [facility]  Medical Director [place] Director [facility] Cardio-Pulmonary Department

## 2023-02-22 NOTE — Consult Note (Signed)
[facility] for Electrolyte Monitoring and Replacement   Recent Labs: Potassium (mmol/L)  Date Value  02/22/2023 3.8   Magnesium (mg/dL)  Date Value  02/22/2023 1.7   Calcium (mg/dL)  Date Value  02/22/2023 8.1 (L)   Albumin (g/dL)  Date Value  02/22/2023 2.0 (L)   Phosphorus (mg/dL)  Date Value  02/22/2023 2.8   Sodium (mmol/L)  Date Value  02/22/2023 135    Assessment: 76 yo M with PMH HTN, CKD, CHF (EF 50-55%), AAA, CAD, COPD presents with hypovolemic shock d/t ABLA - GI bleed. Pt in AKI on CKD4, now on CRRT which was stopped overnight.   Goal of Therapy:  Potassium 4.0 - 5.1 mmol/L Magnesium 2.0 - 2.4 mg/dL All Other Electrolytes WNL  Plan:  2 grams iv magnesium sulfate x 1 Follow up electrolytes 1600 per protocol  Vallery Sa, PharmD, BCPS 02/22/2023 7:11 AM

## 2023-02-23 DIAGNOSIS — R579 Shock, unspecified: Secondary | ICD-10-CM | POA: Diagnosis not present

## 2023-02-23 LAB — BPAM RBC
Blood Product Expiration Date: 202404092359
Blood Product Expiration Date: 202404092359
Blood Product Expiration Date: 202404122359
Blood Product Expiration Date: 202404122359
Blood Product Expiration Date: 202404132359
Blood Product Expiration Date: 202404132359
ISSUE DATE / TIME: 202403061009
ISSUE DATE / TIME: 202403061350
ISSUE DATE / TIME: 202403061923
ISSUE DATE / TIME: 202403072251
ISSUE DATE / TIME: 202403081738
ISSUE DATE / TIME: 202403090122
Unit Type and Rh: 5100
Unit Type and Rh: 5100
Unit Type and Rh: 5100
Unit Type and Rh: 5100
Unit Type and Rh: 5100
Unit Type and Rh: 5100

## 2023-02-23 LAB — TYPE AND SCREEN
ABO/RH(D): O POS
Antibody Screen: NEGATIVE
Unit division: 0
Unit division: 0
Unit division: 0
Unit division: 0
Unit division: 0
Unit division: 0

## 2023-02-23 LAB — RENAL FUNCTION PANEL
Albumin: 2.1 g/dL — ABNORMAL LOW (ref 3.5–5.0)
Anion gap: 4 — ABNORMAL LOW (ref 5–15)
BUN: 26 mg/dL — ABNORMAL HIGH (ref 8–23)
CO2: 29 mmol/L (ref 22–32)
Calcium: 8 mg/dL — ABNORMAL LOW (ref 8.9–10.3)
Chloride: 102 mmol/L (ref 98–111)
Creatinine, Ser: 1.82 mg/dL — ABNORMAL HIGH (ref 0.61–1.24)
GFR, Estimated: 38 mL/min — ABNORMAL LOW (ref >60)
Glucose, Bld: 115 mg/dL — ABNORMAL HIGH (ref 70–99)
Phosphorus: 2.8 mg/dL (ref 2.5–4.6)
Potassium: 3.8 mmol/L (ref 3.5–5.1)
Sodium: 135 mmol/L (ref 135–145)

## 2023-02-23 LAB — GLUCOSE, CAPILLARY
Glucose-Capillary: 100 mg/dL — ABNORMAL HIGH (ref 70–99)
Glucose-Capillary: 111 mg/dL — ABNORMAL HIGH (ref 70–99)
Glucose-Capillary: 117 mg/dL — ABNORMAL HIGH (ref 70–99)
Glucose-Capillary: 98 mg/dL (ref 70–99)

## 2023-02-23 LAB — CBC
HCT: 24.4 % — ABNORMAL LOW (ref 39.0–52.0)
Hemoglobin: 7.5 g/dL — ABNORMAL LOW (ref 13.0–17.0)
MCH: 29.5 pg (ref 26.0–34.0)
MCHC: 30.7 g/dL (ref 30.0–36.0)
MCV: 96.1 fL (ref 80.0–100.0)
Platelets: 66 10*3/uL — ABNORMAL LOW (ref 150–400)
RBC: 2.54 MIL/uL — ABNORMAL LOW (ref 4.22–5.81)
RDW: 17.2 % — ABNORMAL HIGH (ref 11.5–15.5)
WBC: 12.1 10*3/uL — ABNORMAL HIGH (ref 4.0–10.5)
nRBC: 0.2 % (ref 0.0–0.2)

## 2023-02-23 LAB — MAGNESIUM: Magnesium: 1.9 mg/dL (ref 1.7–2.4)

## 2023-02-23 NOTE — Consult Note (Signed)
[place] for Electrolyte Monitoring and Replacement   Recent Labs: Potassium (mmol/L)  Date Value  02/23/2023 3.8   Magnesium (mg/dL)  Date Value  02/23/2023 1.9   Calcium (mg/dL)  Date Value  02/23/2023 8.0 (L)   Albumin (g/dL)  Date Value  02/23/2023 2.1 (L)   Phosphorus (mg/dL)  Date Value  02/23/2023 2.8   Sodium (mmol/L)  Date Value  02/23/2023 135    Assessment: 77 yo M with PMH HTN, CKD, CHF (EF 50-55%), AAA, CAD, COPD presents with hypovolemic shock d/t ABLA - GI bleed. Pt in AKI on CKD4, now on CRRT which was stopped overnight.   -now off CRRT d/t line clotting  Goal of Therapy:  Potassium 4.0 - 5.1 mmol/L Magnesium 2.0 - 2.4 mg/dL All Other Electrolytes WNL  Plan:  Off CRRT No electrolyte replacement at this time F/u electrolytes w/ am labs  Chinita Greenland PharmD Clinical Pharmacist 02/23/2023

## 2023-02-23 NOTE — Progress Notes (Signed)
[name]  PROGRESS NOTE   Subjective:   More awake and alert.  Was given ibuprofen last night. Patient has been on a small dose of Levophed and vasopressin. He has been off of CVVH  Objective:  Vital signs: Blood pressure (!) 80/50, pulse 66, temperature 98 F (36.7 C), resp. rate 18, height '5\' 11"'$  (1.803 m), weight 82 kg, SpO2 95 %.  Intake/Output Summary (Last 24 hours) at 02/23/2023 1046 Last data filed at 02/23/2023 0400 Gross per 24 hour  Intake 771.67 ml  Output 600 ml  Net 171.67 ml   Filed Weights   02/21/23 0500 02/22/23 0500 02/23/23 0423  Weight: 79.7 kg 81.2 kg 82 kg     Physical Exam: General:  No acute distress  Head:  Normocephalic, atraumatic. Moist oral mucosal membranes  Eyes:  Anicteric  Neck:  Supple  Lungs:   Clear to auscultation, normal effort  Heart:  S1S2 no rubs  Abdomen:   Soft, nontender, bowel sounds present  Extremities:  peripheral edema.  Neurologic:  Awake, alert, following commands  Skin:  No lesions  Access:     Basic Metabolic Panel: Recent Labs  Lab 02/19/23 0602 02/19/23 0604 02/20/23 0553 02/20/23 [id number] 02/20/23 1801 02/20/23 2307 02/21/23 0616 02/21/23 0617 02/21/23 1613 02/22/23 0600 02/23/23 0400 02/23/23 0407  NA 139   < > 138   < > 137 137  --  137 135 135 135  --   K 5.1   < > 6.6*   < > 5.7* 4.2  --  3.8 4.0 3.8 3.8  --   CL 102   < > 106   < > 103 101  --  100 101 102 102  --   CO2 32   < > 25   < > 29 29  --  '31 30 30 29  '$ --   GLUCOSE 117*   < > 130*   < > 211* 222*  --  161* 142* 134* 115*  --   BUN 70*   < > 82*   < > 78* 58*  --  41* 29* 22 26*  --   CREATININE 2.35*   < > 2.91*   < > 2.85* 2.08*  --  1.69* 1.48* 1.39* 1.82*  --   CALCIUM 8.7*   < > 8.3*   < > 7.9* 7.6*  --  7.9* 7.8* 8.1* 8.0*  --   MG 2.1  --  1.8  --   --   --  1.7  --   --  1.7  --  1.9  PHOS  --    < > 4.5  --  4.6  --   --  2.9 2.6 2.8 2.8  --    < > = values in this interval not displayed.   GFR: Estimated  Creatinine Clearance: 36.2 mL/min (A) (by C-G formula based on SCr of 1.82 mg/dL (H)).  Liver Function Tests: Recent Labs  Lab 02/19/23 0602 02/19/23 1734 02/20/23 0553 02/20/23 1801 02/21/23 0617 02/21/23 1613 02/22/23 0600 02/23/23 0400  AST 32 35  --   --   --   --   --   --   ALT 24 22  --   --   --   --   --   --   ALKPHOS 97 64  --   --   --   --   --   --   BILITOT 0.8 1.5*  --   --   --   --   --   --  PROT 5.1* 4.1*  --   --   --   --   --   --   ALBUMIN 2.2* 2.2*   < > 2.1* 2.1* 1.8* 2.0* 2.1*   < > = values in this interval not displayed.   No results for input(s): "LIPASE", "AMYLASE" in the last 168 hours. No results for input(s): "AMMONIA" in the last 168 hours.  CBC: Recent Labs  Lab 02/17/23 0542 02/18/23 0730 02/19/23 0602 02/19/23 0840 02/19/23 1734 02/19/23 2325 02/20/23 0646 02/20/23 1426 02/21/23 1001 02/21/23 1613 02/21/23 2305 02/22/23 0600 02/23/23 0407  WBC 7.7 6.1 9.1  --  16.0*  --  33.1*  --   --   --   --   --  12.1*  NEUTROABS 6.2 4.9 7.2  --  12.4*  --   --   --   --   --   --   --   --   HGB 11.8* 11.9* 9.3*   < > 7.6*   < > 8.2*   < > 7.2* 6.3* 6.9* 7.6* 7.5*  HCT 38.1* 37.5* 30.0*   < > 25.2*   < > 25.4*   < > 23.0* 19.8* 22.0* 24.5* 24.4*  MCV 103.3* 101.9* 103.1*  --  98.1  --  93.4  --   --   --   --   --  96.1  PLT 197 184 192  --  129*  --  129*  --   --   --   --   --  66*   < > = values in this interval not displayed.     HbA1C: Hgb A1c MFr Bld  Date/Time Value Ref Range Status  10/02/2022 11:16 PM 6.3 (H) 4.8 - 5.6 % Final    Comment:    (NOTE) Pre diabetes:          5.7%-6.4%  Diabetes:              >6.4%  Glycemic control for   <7.0% adults with diabetes     Urinalysis: No results for input(s): "COLORURINE", "LABSPEC", "PHURINE", "GLUCOSEU", "HGBUR", "BILIRUBINUR", "KETONESUR", "PROTEINUR", "UROBILINOGEN", "NITRITE", "LEUKOCYTESUR" in the last 72 hours.  Invalid input(s): "APPERANCEUR"    Imaging: No  results found.   Medications:    sodium chloride Stopped (02/21/23 0330)   norepinephrine (LEVOPHED) Adult infusion Stopped (02/22/23 [id number])   vasopressin Stopped (02/22/23 1011)    vitamin C  500 mg Oral BID   Chlorhexidine Gluconate Cloth  6 each Topical Daily   diclofenac Sodium  4 g Topical QID   feeding supplement  237 mL Oral TID BM   multivitamin  1 tablet Oral QHS   pantoprazole (PROTONIX) IV  40 mg Intravenous Q12H    Assessment/ Plan:     77 y.o. black male with chronic diastolic congestive heart failure, hypertension, COPD, hyperlipidemia, who is admitted to [facility] on 01/29/2023 for Peripheral edema [R60.9] Shock circulatory ([place]) [R57.9] Acute on chronic congestive heart failure, unspecified heart failure type ([place]) [I50.9] Acute renal failure superimposed on chronic kidney disease, unspecified acute renal failure type, unspecified CKD stage ([place]) [N17.9, N18.9]   #1: Acute kidney injury: Patient has history of chronic kidney disease.  Presently the GFR has been stable and urine output is close to 1 L yesterday.  He had multiple clotting episodes with CRRT lines and also became severely anemic requiring transfusion.  Presently he is off of CRRT.  Will continue to monitor closely.   #2  acute respiratory failure requiring intubation: Patient is now extubated and awake.   #3: Hypotension: Patient had cardiogenic shock requiring pressors.  Still on vasopressin at a small dose. Will continue to monitor closely.   #4: Anemia: Patient had GI bleed and required multiple transfusions.   Will continue to monitor closely.  Case discussed with ICU attending.  Will reassess for CRRT in AM.    LOS: [place], MD [facility] 3/10/202410:46 AM

## 2023-02-23 NOTE — Progress Notes (Signed)
PROGRESS NOTE    John [name]   [id number] DOB: [birth year]  DOA: 01/29/2023 Date of Service: 02/23/23 PCP: Alisa Graff, FNP     Brief Narrative / Hospital Course:  77 year old male with a history of essential hypertension, on hydralazine and Cozaar, stage IV CKD but has not been on dialysis in the past, has chronic diastolic and systolic heart failure with cor pulmonale, moderate tricuspid regurgitation and aortic valve regurgitation, history of ascending aortic aneurysm, coronary artery disease with atherosclerosis, COPD and chronic hypoxemia baseline 2 L/min supplemental oxygen at home with recurrent episodes of hypoxemia and remote acute exacerbation of COPD with hypoxemia and hypercapnia 2 months ago history of ruptured left quadricep tendon 2 years ago, chronic thrombocytopenia and erythrocytosis recurrent lower extremity cellulitis, recurrent hyperkalemia, morbid obesity history of anasarca dyslipidemia, recent admission for acute CHF exacerbation requiring Lasix drip and BiPAP support, who was brought in by EMS due to worsening altered mental status and circulatory shock.    2/14: intubated, admitted to ICU for shock, CRRT 2/15- Echo w/ RV dysfunction and concern for PE. CTPE today.  He is weaned down on pressors on vasor 0.4 and 4 of levophed reduced from 36 this morning.  2/16- remains agitated intermittently overnight. Continued issues with clot burden through CRRT circuit. 2/17- remains agitated overnight. Requiring numerous ativan pushes. Attempting to wean down sedation. Failed SBT miserably. 2/18- issues with CRRT overnight. Will attempt higher UF today. Assess for possible chest tube placement.  2/19- No issues with CRRT overnight, currently running without complication.  CT Head obtained overnight and negative for acute process. Off Precedex this morning, somnolent. SBT as mental status permits. Stop Cefepime, checking ammonia. 2/20- Remains on CRRT. Pt more awake and  tracking.Tolerated SBT for 12 hrs  2/21- Pt remains mechanically intubated on minimal vent settings.  Pt able to track and open eyes on command, however unable to move BLE 2/22-Off vasopressors.  Palliative Care consulted pt stating he DOES NOT want any form of dialysis.  If pt remains stable will transfer service to [facility] later changed his mind on dialysis.  2/26: Placed PERM CATH for HD 2/27: status post hemodialysis.  Doing reasonably well.  Mental status improved.  Is pretty clear with [place] now that he does not want hemodialysis. 2/28 -patient has gone back and forth about wanting dialysis and not wanting dialysis.  He told the palliative team this morning that he did not want any further dialysis: Back to room states that he wants everything done now.  Nephrology is going to hold dialysis today and tomorrow and evaluate for Friday to see if he needs it given that he had dialysis in the last 2 days.  The dialysis catheter was placed 2 days ago is working well and patient is healing recovering as expected per the vascular team and they are recommending outpatient follow-up. 2/29: Dialysis held today and yesterday.  Monitoring for renal recovery. Creatinine started to rise.  Nephrology starting Lasix 80 mg IV. 3/4 removal or PERM CATH He made decision for no dialysis and as such dialysis catheter was removed  3/5: stable awaiting SNF  3/6 large volume hematochezia, duodenal bleed on CTA, EGD done, extubated post-procedure and on pressor support, s/p 3 units PRBC. States he wants everything done to try to live, except CPR 3/7 Started on CRRT. Reiterates no CPR or repeat intubation in event of respiratory distress but intubation for procedures is acceptable. OK w/ dialysis.  3/8 stable still on pressors and CRRT, potassium  stable/WNL, more alert and conversational today, full liquid diet, GI team s/o. Received another unit PRBC.  3/9: off CRRT d/t clotting episodes, off norepi approx 09:00 today and off  vasopressin approx 10:00, max 10L HFNC but down to 4L [place] in afternoon.  3/10: remains stable VS and labs, off CRRT and pressors, down to 2L [place]. Transfer out of stepdown.     Consultants:  Gastroenterology  Palliative Care  Cardiology Nephrology Vascular Surgery  ICU  Procedures: 02/14: intubation  CRRT 02/14-02/20 Emergent EGD 02/19/23 for duodenal bleed 01/29/23 Insertion of Non-tunneled Central Venous Catheter with US guidance  CRRT 03/07-03/09        ASSESSMENT & PLAN:   Principal Problem:   Shock circulatory ([place]) Active Problems:   Acute on chronic congestive heart failure ([place])   Acute hypoxic respiratory failure (HCC)   Toxic metabolic encephalopathy  Hypovolemic shock d/t ABLA - GI bleed Acute blood loss anemia on anemia of chronic disease  Trend CBC/HH and transfuse as needed  Protonix IV bid  Pressors to titrate --> off and BP ok  No anticoags  GI has s/o   Hyperkalemia - resolved Treating as needed and following BMP  Cardiogenic shock POA d/t HFpEF - improved but now hypovolemic shock d/t GI bleed and Acute decompensated HFpEF and right-sided heart failure RV dysfunction and dilation on echocardiography, concerning for RV failure and pulmonary hypertension. Porto-pulmonary hypertension on the differential given history of liver cirrhosis.  02/19/23 w/ GI bleed and hypovolemic shock he underwent aggressive fluid resuscitation and PRBC pending EGD, known risk w/ CHF and previous dialysis - monitor fluid status closely  CVVH for ultra-filtration for volume removal --> stopped this  Cardiology had previously signed off, may need to reengage but no further diagnostics from their standpoint.but pt is recovering well  Nephrology is on board we appreciate input   Acute Hypoxic Respiratory Failure d/t HFpEF and hypovolemic shock Continue supplemental O2 as needed to maintain SpO2 >92% NO REINTUBATION if respiratory distress    Toxic Metabolic Encephalopathy -  improved but waxes and wanes I judge his decision-making capacity to be limited but not absent.  See [place] discussions below   Acute kidney injury on chronic kidney disease stage IV CRRT d/c Nephrology following    Hepatic cirrhosis Thrombocytopenia.   Was on cefepime empirically given leukocytosis.  Cultures remain negative. Finished 5 days of antibiotics.  Monitor CBC, CMP  Goals of Care I judge his capacity to be limited but not absent. He appears to have a basic understanding of his situation and his options, though lethargy precludes him communicating in any great detail. He appears more alert and is more communicative 02/20/23 - 02/21/23 Intubation for procedural intervention w/ anesthesia does not violate a DNR and patient assents to this if needed  He has expressed clear general goals -  wants to do what is possible to live if medical interventions to reach this goal are judged to be futile, and/or if respiratory or cardiac distress/arrest occurs, will transition to comfort care He would like not to be kept on long term life support machines (ie if intubated for procedure and not able to wen off vent in a couple days, would like to be transitioned to comfort care)  Remains DNR I made clear to him that if he decompensates to the point that intubation is needed to maintain respiration, I do not think he would come off the ventilator - he agrees DO NOT INTUBATE IN SUCH A CASE.  I  made clear to him that if he experiences severe cardiac arrhythmia or arrest, I do not think he would meaningfully recovery cardiac function despite aggressive intervention - he agrees NO CPR/DEFIBRILLATION.  in absence of ideal decision-making capacity, and in absence of a designated surrogate decision-maker or [place] other than his attending physician (in cooperation their consulting physicians), it is ethically appropriate to treat with presumed goal of preserving life, but withholding or discontinuing  treatments which are judged by 2+ physicians to be medically futile (futile = would at most only serve to delay inevitable and certain death).      DVT prophylaxis: SCD Pertinent IV fluids/nutrition: diet as tolerated  Central lines / invasive devices: HD cath L femoral, CVC R femoral, foley   Code Status: DNR  Current Admission Status: inpatient, stepdown --> progressive unit today  TOC needs / Dispo plan: SNF Barriers to discharge / significant pending items: medically unstable GI bleed --> circulatory shock, CRRT --> improving and transferring to floor today              Subjective / Brief ROS:  Patient asleep but awakens easily, not very talkative this morning, states he's tired. No concerns from RN at bedside.   Family Communication: none at this time     Objective Findings:  Vitals:   02/23/23 0400 02/23/23 0423 02/23/23 0500 02/23/23 0600  BP: (!) 82/57  96/65 (!) 80/50  Pulse: 87  66   Resp: (!) '21  12 18  '$ Temp: 98 F (36.7 C)     TempSrc:      SpO2: 100%  95%   Weight:  82 kg    Height:        Intake/Output Summary (Last 24 hours) at 02/23/2023 0954 Last data filed at 02/23/2023 0400 Gross per 24 hour  Intake 783.2 ml  Output 600 ml  Net 183.2 ml   Filed Weights   02/21/23 0500 02/22/23 0500 02/23/23 0423  Weight: 79.7 kg 81.2 kg 82 kg    Examination:  Physical Exam Constitutional:      General: He is not in acute distress.    Appearance: He is ill-appearing.  HENT:     Mouth/Throat:     Mouth: Mucous membranes are dry.  Cardiovascular:     Rate and Rhythm: Normal rate and regular rhythm.     Heart sounds: Normal heart sounds.  Pulmonary:     Effort: Pulmonary effort is normal. No respiratory distress.     Breath sounds: Normal breath sounds.  Abdominal:     Palpations: Abdomen is soft.  Musculoskeletal:     Right lower leg: Right lower leg edema: trace.     Left lower leg: Left lower leg edema: trace.  Skin:    General: Skin is  warm and dry.  Neurological:     General: No focal deficit present.     Mental Status: He is alert.          Scheduled Medications:   vitamin C  500 mg Oral BID   Chlorhexidine Gluconate Cloth  6 each Topical Daily   diclofenac Sodium  4 g Topical QID   feeding supplement  237 mL Oral TID BM   multivitamin  1 tablet Oral QHS   pantoprazole (PROTONIX) IV  40 mg Intravenous Q12H    Continuous Infusions:  sodium chloride Stopped (02/21/23 0330)   norepinephrine (LEVOPHED) Adult infusion Stopped (02/22/23 0923)   vasopressin Stopped (02/22/23 1011)    PRN Medications:  mouth rinse  Antimicrobials from admission:  Anti-infectives (From admission, onward)    Start     Dose/Rate Route Frequency Ordered Stop   02/19/23 0645  cefTRIAXone (ROCEPHIN) 1 g in sodium chloride 0.9 % 100 mL IVPB  Status:  Discontinued        1 g 200 mL/hr over 30 Minutes Intravenous Every 24 hours 02/19/23 0556 02/20/23 0844   02/11/23 0000  ceFAZolin (ANCEF) IVPB 1 g/50 mL premix       Note to Pharmacy: Send with pt to OR   1 g 100 mL/hr over 30 Minutes Intravenous On call 02/10/23 0908 02/10/23 0904   01/31/23 1700  ceFEPIme (MAXIPIME) 2 g in sodium chloride 0.9 % 100 mL IVPB  Status:  Discontinued        2 g 200 mL/hr over 30 Minutes Intravenous Every 12 hours 01/31/23 1555 02/03/23 1048   01/30/23 1700  vancomycin (VANCOREADY) IVPB 1250 mg/250 mL  Status:  Discontinued        1,250 mg 166.7 mL/hr over 90 Minutes Intravenous Every 24 hours 01/29/23 1543 01/30/23 1036   01/30/23 1300  Ampicillin-Sulbactam (UNASYN) 3 g in sodium chloride 0.9 % 100 mL IVPB  Status:  Discontinued        3 g 200 mL/hr over 30 Minutes Intravenous Every 8 hours 01/30/23 1036 01/31/23 1118   01/30/23 0000  vancomycin variable dose per unstable renal function (pharmacist dosing)  Status:  Discontinued         Does not apply See admin instructions 01/29/23 1125 01/29/23 1548   01/29/23 2200  piperacillin-tazobactam  (ZOSYN) IVPB 3.375 g  Status:  Discontinued        3.375 g 12.5 mL/hr over 240 Minutes Intravenous Every 6 hours 01/29/23 1547 01/29/23 1548   01/29/23 2200  piperacillin-tazobactam (ZOSYN) IVPB 3.375 g  Status:  Discontinued        3.375 g 100 mL/hr over 30 Minutes Intravenous Every 6 hours 01/29/23 1548 01/30/23 1036   01/29/23 2100  piperacillin-tazobactam (ZOSYN) IVPB 2.25 g  Status:  Discontinued        2.25 g 100 mL/hr over 30 Minutes Intravenous Every 8 hours 01/29/23 1125 01/29/23 1547   01/29/23 1115  piperacillin-tazobactam (ZOSYN) IVPB 3.375 g        3.375 g 100 mL/hr over 30 Minutes Intravenous  Once 01/29/23 1103 01/29/23 1457   01/29/23 1115  vancomycin (VANCOREADY) IVPB 2000 mg/400 mL        2,000 mg 200 mL/hr over 120 Minutes Intravenous  Once 01/29/23 1104 01/29/23 1656           Data Reviewed:  I have personally reviewed the following...  CBC: Recent Labs  Lab 02/17/23 0542 02/18/23 0730 02/19/23 0602 02/19/23 0840 02/19/23 1734 02/19/23 2325 02/20/23 0646 02/20/23 1426 02/21/23 1001 02/21/23 1613 02/21/23 2305 02/22/23 0600 02/23/23 0407  WBC 7.7 6.1 9.1  --  16.0*  --  33.1*  --   --   --   --   --  12.1*  NEUTROABS 6.2 4.9 7.2  --  12.4*  --   --   --   --   --   --   --   --   HGB 11.8* 11.9* 9.3*   < > 7.6*   < > 8.2*   < > 7.2* 6.3* 6.9* 7.6* 7.5*  HCT 38.1* 37.5* 30.0*   < > 25.2*   < > 25.4*   < > 23.0* 19.8* 22.0* 24.5*  24.4*  MCV 103.3* 101.9* 103.1*  --  98.1  --  93.4  --   --   --   --   --  96.1  PLT 197 184 192  --  129*  --  129*  --   --   --   --   --  66*   < > = values in this interval not displayed.   Basic Metabolic Panel: Recent Labs  Lab 02/19/23 0602 02/19/23 0604 02/20/23 [id number] 02/20/23 [id number] 02/20/23 1801 02/20/23 2307 02/21/23 0616 02/21/23 0617 02/21/23 1613 02/22/23 0600 02/23/23 0400 02/23/23 0407  NA 139   < > 138   < > 137 137  --  137 135 135 135  --   K 5.1   < > 6.6*   < > 5.7* 4.2  --  3.8 4.0 3.8  3.8  --   CL 102   < > 106   < > 103 101  --  100 101 102 102  --   CO2 32   < > 25   < > 29 29  --  '31 30 30 29  '$ --   GLUCOSE 117*   < > 130*   < > 211* 222*  --  161* 142* 134* 115*  --   BUN 70*   < > 82*   < > 78* 58*  --  41* 29* 22 26*  --   CREATININE 2.35*   < > 2.91*   < > 2.85* 2.08*  --  1.69* 1.48* 1.39* 1.82*  --   CALCIUM 8.7*   < > 8.3*   < > 7.9* 7.6*  --  7.9* 7.8* 8.1* 8.0*  --   MG 2.1  --  1.8  --   --   --  1.7  --   --  1.7  --  1.9  PHOS  --    < > 4.5  --  4.6  --   --  2.9 2.6 2.8 2.8  --    < > = values in this interval not displayed.   GFR: Estimated Creatinine Clearance: 36.2 mL/min (A) (by C-G formula based on SCr of 1.82 mg/dL (H)). Liver Function Tests: Recent Labs  Lab 02/19/23 0602 02/19/23 1734 02/20/23 0553 02/20/23 1801 02/21/23 0617 02/21/23 1613 02/22/23 0600 02/23/23 0400  AST 32 35  --   --   --   --   --   --   ALT 24 22  --   --   --   --   --   --   ALKPHOS 97 64  --   --   --   --   --   --   BILITOT 0.8 1.5*  --   --   --   --   --   --   PROT 5.1* 4.1*  --   --   --   --   --   --   ALBUMIN 2.2* 2.2*   < > 2.1* 2.1* 1.8* 2.0* 2.1*   < > = values in this interval not displayed.   No results for input(s): "LIPASE", "AMYLASE" in the last 168 hours. No results for input(s): "AMMONIA" in the last 168 hours. Coagulation Profile: Recent Labs  Lab 02/19/23 0602  INR 1.2   Cardiac Enzymes: No results for input(s): "CKTOTAL", "CKMB", "CKMBINDEX", "TROPONINI" in the last 168 hours. BNP (last 3 results) No results for input(s): "PROBNP" in the  last 8760 hours. HbA1C: No results for input(s): "HGBA1C" in the last 72 hours. CBG: Recent Labs  Lab 02/22/23 1513 02/22/23 1944 02/23/23 0003 02/23/23 0403 02/23/23 0751  GLUCAP 90 81 98 111* 117*   Lipid Profile: No results for input(s): "CHOL", "HDL", "LDLCALC", "TRIG", "CHOLHDL", "LDLDIRECT" in the last 72 hours. Thyroid Function Tests: No results for input(s): "TSH", "T4TOTAL",  "FREET4", "T3FREE", "THYROIDAB" in the last 72 hours. Anemia Panel: No results for input(s): "VITAMINB12", "FOLATE", "FERRITIN", "TIBC", "IRON", "RETICCTPCT" in the last 72 hours. Most Recent Urinalysis On File:     Component Value Date/Time   COLORURINE YELLOW (A) 01/29/2023 2227   APPEARANCEUR HAZY (A) 01/29/2023 2227   LABSPEC 1.014 01/29/2023 2227   PHURINE 5.0 01/29/2023 2227   GLUCOSEU NEGATIVE 01/29/2023 2227   HGBUR MODERATE (A) 01/29/2023 2227   BILIRUBINUR NEGATIVE 01/29/2023 2227   KETONESUR 5 (A) 01/29/2023 2227   PROTEINUR NEGATIVE 01/29/2023 2227   NITRITE NEGATIVE 01/29/2023 2227   LEUKOCYTESUR TRACE (A) 01/29/2023 2227   Sepsis Labs: '@LABRCNTIP'$ (procalcitonin:4,lacticidven:4) Microbiology: No results found for this or any previous visit (from the past 240 hour(s)).    Radiology Studies last 3 days: CT ANGIO GI BLEED  Result Date: 02/19/2023 CLINICAL DATA:  Bloody bowel movement. Concern for active gastrointestinal bleeding. EXAM: CTA ABDOMEN AND PELVIS WITHOUT AND WITH CONTRAST TECHNIQUE: Multidetector CT imaging of the abdomen and pelvis was performed using the standard protocol during bolus administration of intravenous contrast. Multiplanar reconstructed images and MIPs were obtained and reviewed to evaluate the vascular anatomy. RADIATION DOSE REDUCTION: This exam was performed according to the departmental dose-optimization program which includes automated exposure control, adjustment of the mA and/or kV according to patient size and/or use of iterative reconstruction technique. CONTRAST:  33m OMNIPAQUE IOHEXOL 350 MG/ML SOLN COMPARISON:  None Available. FINDINGS: VASCULAR Aorta: Atherosclerotic calcification of the aorta. Celiac: Patent without evidence of aneurysm, dissection, vasculitis or significant stenosis. SMA: Patent without evidence of aneurysm, dissection, vasculitis or significant stenosis. Renals: Both renal arteries are patent without evidence of aneurysm,  dissection, vasculitis, fibromuscular dysplasia or significant stenosis. IMA: Patent without evidence of aneurysm, dissection, vasculitis or significant stenosis. Inflow: Patent without evidence of aneurysm, dissection, vasculitis or significant stenosis. Proximal Outflow: Aortic calcification. Atherosclerotic calcification. No aneurysm. Veins: No obvious venous abnormality within the limitations of this arterial phase study. Review of the MIP images confirms the above findings. NON-VASCULAR Lower chest: Lung bases are clear. Hepatobiliary: No focal hepatic lesion. Normal gallbladder. No biliary duct dilatation. Common bile duct is normal. Pancreas: Pancreas is normal. No ductal dilatation. No pancreatic inflammation. Spleen: Normal spleen Adrenals/urinary tract: Adrenal glands and kidneys are normal. The ureters and bladder normal. Stomach/Bowel: Stomach is normal. On delayed portal venous phase imaging there is a collection of endoluminal contrast within the second portion the duodenum (image 32/11). Findings consistent with active CT chest of bleeding. On arterial phase imaging there is a tiny focus of intraluminal contrast at this site (image 67/5). No evidence of active gastrointestinal bleeding within the small bowel. No small bowel inflammation or dilatation. There is high-density material within the colon. No evidence of active extravasation IV contrast into the lumen of the LEFT or RIGHT colon. Rectosigmoid colon likewise unremarkable except for several diverticula. Rectum unremarkable. Vascular/Lymphatic: Abdominal aorta is normal caliber with atherosclerotic calcification. There is no retroperitoneal or periportal lymphadenopathy. No pelvic lymphadenopathy. Reproductive: Prostate unremarkable Other: Mild sarcoma of the soft tissues. Musculoskeletal: Degenerative osteophytosis of the spine. IMPRESSION: VASCULAR 1. Active gastrointestinal bleeding in  the second portion duodenum evident on the portal venous  phase imaging. 2. Atherosclerotic calcification of the aorta is branches without high-grade stenosis. NON-VASCULAR 1. No evidence of bowel obstruction or inflammation. These results will be called to the ordering clinician or representative by the Radiologist Assistant, and communication documented in the PACS or [employer]. Electronically Signed   By: Suzy Bouchard M.D.   On: 02/19/2023 13:59             LOS: 25 days       Emeterio Reeve, DO [facility] 02/23/2023, 9:54 AM    Dictation software may have been used to generate the above note. Typos may occur and escape review in typed/dictated notes. Please contact Dr Sheppard Coil directly for clarity if needed.  Staff may message me via secure chat in [place]  but this may not receive an immediate response,  please page me for urgent matters!  If 7PM-7AM, please contact night coverage [email or web address]

## 2023-02-24 DIAGNOSIS — R579 Shock, unspecified: Secondary | ICD-10-CM | POA: Diagnosis not present

## 2023-02-24 LAB — BASIC METABOLIC PANEL
Anion gap: 4 — ABNORMAL LOW (ref 5–15)
BUN: 28 mg/dL — ABNORMAL HIGH (ref 8–23)
CO2: 28 mmol/L (ref 22–32)
Calcium: 8.4 mg/dL — ABNORMAL LOW (ref 8.9–10.3)
Chloride: 103 mmol/L (ref 98–111)
Creatinine, Ser: 2.06 mg/dL — ABNORMAL HIGH (ref 0.61–1.24)
GFR, Estimated: 33 mL/min — ABNORMAL LOW (ref >60)
Glucose, Bld: 120 mg/dL — ABNORMAL HIGH (ref 70–99)
Potassium: 3.8 mmol/L (ref 3.5–5.1)
Sodium: 135 mmol/L (ref 135–145)

## 2023-02-24 LAB — RENAL FUNCTION PANEL
Albumin: 2.2 g/dL — ABNORMAL LOW (ref 3.5–5.0)
Anion gap: 6 (ref 5–15)
BUN: 27 mg/dL — ABNORMAL HIGH (ref 8–23)
CO2: 26 mmol/L (ref 22–32)
Calcium: 8.3 mg/dL — ABNORMAL LOW (ref 8.9–10.3)
Chloride: 102 mmol/L (ref 98–111)
Creatinine, Ser: 2.05 mg/dL — ABNORMAL HIGH (ref 0.61–1.24)
GFR, Estimated: 33 mL/min — ABNORMAL LOW (ref >60)
Glucose, Bld: 119 mg/dL — ABNORMAL HIGH (ref 70–99)
Phosphorus: 2.7 mg/dL (ref 2.5–4.6)
Potassium: 3.8 mmol/L (ref 3.5–5.1)
Sodium: 134 mmol/L — ABNORMAL LOW (ref 135–145)

## 2023-02-24 LAB — MAGNESIUM: Magnesium: 2 mg/dL (ref 1.7–2.4)

## 2023-02-24 NOTE — Consult Note (Signed)
[place] for Electrolyte Monitoring and Replacement   Recent Labs: Potassium (mmol/L)  Date Value  02/24/2023 3.8  02/24/2023 3.8   Magnesium (mg/dL)  Date Value  02/24/2023 2.0   Calcium (mg/dL)  Date Value  02/24/2023 8.3 (L)  02/24/2023 8.4 (L)   Albumin (g/dL)  Date Value  02/24/2023 2.2 (L)   Phosphorus (mg/dL)  Date Value  02/24/2023 2.7   Sodium (mmol/L)  Date Value  02/24/2023 134 (L)  02/24/2023 135    Assessment: 77 yo M with PMH HTN, CKD, CHF (EF 50-55%), AAA, CAD, COPD presents with hypovolemic shock d/t ABLA - GI bleed. Pt in AKI on CKD4 requiring RRT.  CRRT discontinued secondary to line clotting / un-able to tolerate heparinization due to severe anemia requiring transfusion  Goal of Therapy:  Electrolytes within normal limits  Plan:  --No electrolyte replacement indicated at this time --Electrolytes have been stable overall. Will discontinue electrolyte consult at this time. Defer further ordering of labs and electrolyte replacement to primary team / nephrology --Pharmacy will continue to follow along peripherally  Benita Gutter 02/24/2023

## 2023-02-24 NOTE — Progress Notes (Signed)
PROGRESS NOTE    John [name]   [id number] DOB: [birth year]  DOA: 01/29/2023 Date of Service: 02/24/23 PCP: Alisa Graff, FNP     Brief Narrative / Hospital Course:  77 year old male with a history of essential hypertension, on hydralazine and Cozaar, stage IV CKD but has not been on dialysis in the past, has chronic diastolic and systolic heart failure with cor pulmonale, moderate tricuspid regurgitation and aortic valve regurgitation, history of ascending aortic aneurysm, coronary artery disease with atherosclerosis, COPD and chronic hypoxemia baseline 2 L/min supplemental oxygen at home with recurrent episodes of hypoxemia and remote acute exacerbation of COPD with hypoxemia and hypercapnia 2 months ago history of ruptured left quadricep tendon 2 years ago, chronic thrombocytopenia and erythrocytosis recurrent lower extremity cellulitis, recurrent hyperkalemia, morbid obesity history of anasarca dyslipidemia, recent admission for acute CHF exacerbation requiring Lasix drip and BiPAP support, who was brought in by EMS due to worsening altered mental status and circulatory shock.    2/14: intubated, admitted to ICU for shock, CRRT 2/15- Echo w/ RV dysfunction and concern for PE. CTPE today.  He is weaned down on pressors on vasor 0.4 and 4 of levophed reduced from 36 this morning.  2/16- remains agitated intermittently overnight. Continued issues with clot burden through CRRT circuit. 2/17- remains agitated overnight. Requiring numerous ativan pushes. Attempting to wean down sedation. Failed SBT miserably. 2/18- issues with CRRT overnight. Will attempt higher UF today. Assess for possible chest tube placement.  2/19- No issues with CRRT overnight, currently running without complication.  CT Head obtained overnight and negative for acute process. Off Precedex this morning, somnolent. SBT as mental status permits. Stop Cefepime, checking ammonia. 2/20- Remains on CRRT. Pt more awake and  tracking.Tolerated SBT for 12 hrs  2/21- Pt remains mechanically intubated on minimal vent settings.  Pt able to track and open eyes on command, however unable to move BLE 2/22-Off vasopressors.  Palliative Care consulted pt stating he DOES NOT want any form of dialysis.  If pt remains stable will transfer service to [facility] later changed his mind on dialysis.  2/26: Placed PERM CATH for HD 2/27: status post hemodialysis.  Doing reasonably well.  Mental status improved.  Is pretty clear with [place] now that he does not want hemodialysis. 2/28 -patient has gone back and forth about wanting dialysis and not wanting dialysis.  He told the palliative team this morning that he did not want any further dialysis: Back to room states that he wants everything done now.  Nephrology is going to hold dialysis today and tomorrow and evaluate for Friday to see if he needs it given that he had dialysis in the last 2 days.  The dialysis catheter was placed 2 days ago is working well and patient is healing recovering as expected per the vascular team and they are recommending outpatient follow-up. 2/29: Dialysis held today and yesterday.  Monitoring for renal recovery. Creatinine started to rise.  Nephrology starting Lasix 80 mg IV. 3/4 removal or PERM CATH He made decision for no dialysis and as such dialysis catheter was removed  3/5: stable awaiting SNF  3/6 large volume hematochezia, duodenal bleed on CTA, EGD done, extubated post-procedure and on pressor support, s/p 3 units PRBC. States he wants everything done to try to live, except CPR 3/7 Started on CRRT. Reiterates no CPR or repeat intubation in event of respiratory distress but intubation for procedures is acceptable. OK w/ dialysis.  3/8 stable still on pressors and CRRT, potassium  stable/WNL, more alert and conversational today, full liquid diet, GI team s/o. Received another unit PRBC.  3/9: off CRRT d/t clotting episodes, off norepi approx 09:00 today and off  vasopressin approx 10:00, max 10L [facility]NC but down to 4L [place] in afternoon.  3/10-03/11: remains stable VS and labs, off CRRT and pressors, down to 2L . Transfer out of stepdown.     Consultants:  Gastroenterology  Palliative Care  Cardiology Nephrology Vascular Surgery  ICU  Procedures: 02/14: intubation  CRRT 02/14-02/20 Emergent EGD 02/19/23 for duodenal bleed 01/29/23 Insertion of Non-tunneled Central Venous Catheter with US guidance  CRRT 03/07-03/09        ASSESSMENT & PLAN:   Principal Problem:   Shock circulatory ([place]) Active Problems:   Acute on chronic congestive heart failure ([place])   Acute hypoxic respiratory failure (HCC)   Toxic metabolic encephalopathy  Hypovolemic shock d/t ABLA - GI bleed Acute blood loss anemia on anemia of chronic disease  Trend CBC/HH and transfuse as needed  Protonix IV bid  Pressors to titrate --> off and BP ok  No anticoags  GI has s/o   Hyperkalemia - resolved Treating as needed and following BMP  Cardiogenic shock POA d/t HFpEF - improved but now hypovolemic shock d/t GI bleed and Acute decompensated HFpEF and right-sided heart failure RV dysfunction and dilation on echocardiography, concerning for RV failure and pulmonary hypertension. Porto-pulmonary hypertension on the differential given history of liver cirrhosis.  02/19/23 w/ GI bleed and hypovolemic shock he underwent aggressive fluid resuscitation and PRBC pending EGD, known risk w/ CHF and previous dialysis - monitor fluid status closely  CVVH for ultra-filtration for volume removal --> stopped this  Cardiology had previously signed off, may need to reengage but no further diagnostics from their standpoint.but pt is recovering well  Nephrology is on board we appreciate input   Acute Hypoxic Respiratory Failure d/t HFpEF and hypovolemic shock Continue supplemental O2 as needed to maintain SpO2 >92% NO REINTUBATION if respiratory distress    Toxic Metabolic  Encephalopathy - improved but waxes and wanes I judge his decision-making capacity to be limited but not absent.  See [place] discussions below   Acute kidney injury on chronic kidney disease stage IV CRRT d/c Nephrology following    Hepatic cirrhosis Thrombocytopenia.   Was on cefepime empirically given leukocytosis.  Cultures remain negative. Finished 5 days of antibiotics.  Monitor CBC, CMP  Goals of Care I judge his capacity to be limited but not absent. He appears to have a basic understanding of his situation and his options, though lethargy precludes him communicating in any great detail. He appears more alert and is more communicative 02/20/23 - 02/21/23 Intubation for procedural intervention w/ anesthesia does not violate a DNR and patient assents to this if needed  He has expressed clear general goals -  wants to do what is possible to live if medical interventions to reach this goal are judged to be futile, and/or if respiratory or cardiac distress/arrest occurs, will transition to comfort care He would like not to be kept on long term life support machines (ie if intubated for procedure and not able to wen off vent in a couple days, would like to be transitioned to comfort care)  Remains DNR I made clear to him that if he decompensates to the point that intubation is needed to maintain respiration, I do not think he would come off the ventilator - he agrees DO NOT INTUBATE IN SUCH A CASE.  I  made clear to him that if he experiences severe cardiac arrhythmia or arrest, I do not think he would meaningfully recovery cardiac function despite aggressive intervention - he agrees NO CPR/DEFIBRILLATION.  in absence of ideal decision-making capacity, and in absence of a designated surrogate decision-maker or [place] other than his attending physician (in cooperation their consulting physicians), it is ethically appropriate to treat with presumed goal of preserving life, but withholding  or discontinuing treatments which are judged by 2+ physicians to be medically futile (futile = would at most only serve to delay inevitable and certain death).      DVT prophylaxis: SCD Pertinent IV fluids/nutrition: diet as tolerated  Central lines / invasive devices: HD cath L femoral, CVC R femoral, foley   Code Status: DNR  Current Admission Status: inpatient, stepdown --> progressive unit today  TOC needs / Dispo plan: SNF Barriers to discharge / significant pending items: medically unstable GI bleed --> circulatory shock, CRRT --> improving and transferring to floor today              Subjective / Brief ROS:  Patient more alert today. Denies pain, SOB. Reports tolerating diet but appetite is low.    Family Communication: none at this time     Objective Findings:  Vitals:   02/24/23 0800 02/24/23 0900 02/24/23 1000 02/24/23 1100  BP: 95/62  113/67 101/71  Pulse: 95     Resp: '19 16 19 16  '$ Temp:      TempSrc:      SpO2: 100%     Weight:      Height:        Intake/Output Summary (Last 24 hours) at 02/24/2023 1155 Last data filed at 02/24/2023 0500 Gross per 24 hour  Intake 440 ml  Output 900 ml  Net -460 ml   Filed Weights   02/22/23 0500 02/23/23 0423 02/24/23 0452  Weight: 81.2 kg 82 kg 78.3 kg    Examination:  Physical Exam Constitutional:      General: He is not in acute distress.    Appearance: He is ill-appearing.  HENT:     Mouth/Throat:     Mouth: Mucous membranes are dry.  Cardiovascular:     Rate and Rhythm: Normal rate and regular rhythm.     Heart sounds: Normal heart sounds.  Pulmonary:     Effort: Pulmonary effort is normal. No respiratory distress.     Breath sounds: Normal breath sounds.  Abdominal:     Palpations: Abdomen is soft.  Musculoskeletal:     Right lower leg: Right lower leg edema: trace.     Left lower leg: Left lower leg edema: trace.  Skin:    General: Skin is warm and dry.  Neurological:     General: No  focal deficit present.     Mental Status: He is alert.          Scheduled Medications:   vitamin C  500 mg Oral BID   Chlorhexidine Gluconate Cloth  6 each Topical Daily   feeding supplement  237 mL Oral TID BM   multivitamin  1 tablet Oral QHS   pantoprazole (PROTONIX) IV  40 mg Intravenous Q12H    Continuous Infusions:  sodium chloride Stopped (02/21/23 0330)    PRN Medications:  mouth rinse  Antimicrobials from admission:  Anti-infectives (From admission, onward)    Start     Dose/Rate Route Frequency Ordered Stop   02/19/23 0645  cefTRIAXone (ROCEPHIN) 1 g in sodium  chloride 0.9 % 100 mL IVPB  Status:  Discontinued        1 g 200 mL/hr over 30 Minutes Intravenous Every 24 hours 02/19/23 0556 02/20/23 0844   02/11/23 0000  ceFAZolin (ANCEF) IVPB 1 g/50 mL premix       Note to Pharmacy: Send with pt to OR   1 g 100 mL/hr over 30 Minutes Intravenous On call 02/10/23 0908 02/10/23 0904   01/31/23 1700  ceFEPIme (MAXIPIME) 2 g in sodium chloride 0.9 % 100 mL IVPB  Status:  Discontinued        2 g 200 mL/hr over 30 Minutes Intravenous Every 12 hours 01/31/23 1555 02/03/23 1048   01/30/23 1700  vancomycin (VANCOREADY) IVPB 1250 mg/250 mL  Status:  Discontinued        1,250 mg 166.7 mL/hr over 90 Minutes Intravenous Every 24 hours 01/29/23 1543 01/30/23 1036   01/30/23 1300  Ampicillin-Sulbactam (UNASYN) 3 g in sodium chloride 0.9 % 100 mL IVPB  Status:  Discontinued        3 g 200 mL/hr over 30 Minutes Intravenous Every 8 hours 01/30/23 1036 01/31/23 1118   01/30/23 0000  vancomycin variable dose per unstable renal function (pharmacist dosing)  Status:  Discontinued         Does not apply See admin instructions 01/29/23 1125 01/29/23 1548   01/29/23 2200  piperacillin-tazobactam (ZOSYN) IVPB 3.375 g  Status:  Discontinued        3.375 g 12.5 mL/hr over 240 Minutes Intravenous Every 6 hours 01/29/23 1547 01/29/23 1548   01/29/23 2200  piperacillin-tazobactam (ZOSYN) IVPB  3.375 g  Status:  Discontinued        3.375 g 100 mL/hr over 30 Minutes Intravenous Every 6 hours 01/29/23 1548 01/30/23 1036   01/29/23 2100  piperacillin-tazobactam (ZOSYN) IVPB 2.25 g  Status:  Discontinued        2.25 g 100 mL/hr over 30 Minutes Intravenous Every 8 hours 01/29/23 1125 01/29/23 1547   01/29/23 1115  piperacillin-tazobactam (ZOSYN) IVPB 3.375 g        3.375 g 100 mL/hr over 30 Minutes Intravenous  Once 01/29/23 1103 01/29/23 1457   01/29/23 1115  vancomycin (VANCOREADY) IVPB 2000 mg/400 mL        2,000 mg 200 mL/hr over 120 Minutes Intravenous  Once 01/29/23 1104 01/29/23 1656           Data Reviewed:  I have personally reviewed the following...  CBC: Recent Labs  Lab 02/18/23 0730 02/19/23 0602 02/19/23 0840 02/19/23 1734 02/19/23 2325 02/20/23 0646 02/20/23 1426 02/21/23 1001 02/21/23 1613 02/21/23 2305 02/22/23 0600 02/23/23 0407  WBC 6.1 9.1  --  16.0*  --  33.1*  --   --   --   --   --  12.1*  NEUTROABS 4.9 7.2  --  12.4*  --   --   --   --   --   --   --   --   HGB 11.9* 9.3*   < > 7.6*   < > 8.2*   < > 7.2* 6.3* 6.9* 7.6* 7.5*  HCT 37.5* 30.0*   < > 25.2*   < > 25.4*   < > 23.0* 19.8* 22.0* 24.5* 24.4*  MCV 101.9* 103.1*  --  98.1  --  93.4  --   --   --   --   --  96.1  PLT 184 192  --  129*  --  129*  --   --   --   --   --  66*   < > = values in this interval not displayed.   Basic Metabolic Panel: Recent Labs  Lab 02/20/23 0553 02/20/23 0646 02/21/23 0616 02/21/23 0617 02/21/23 1613 02/22/23 0600 02/23/23 0400 02/23/23 0407 02/24/23 0450  NA 138   < >  --  137 135 135 135  --  134*  135  K 6.6*   < >  --  3.8 4.0 3.8 3.8  --  3.8  3.8  CL 106   < >  --  100 101 102 102  --  102  103  CO2 25   < >  --  '31 30 30 29  '$ --  26  28  GLUCOSE 130*   < >  --  161* 142* 134* 115*  --  119*  120*  BUN 82*   < >  --  41* 29* 22 26*  --  27*  28*  CREATININE 2.91*   < >  --  1.69* 1.48* 1.39* 1.82*  --  2.05*  2.06*  CALCIUM 8.3*    < >  --  7.9* 7.8* 8.1* 8.0*  --  8.3*  8.4*  MG 1.8  --  1.7  --   --  1.7  --  1.9 2.0  PHOS 4.5   < >  --  2.9 2.6 2.8 2.8  --  2.7   < > = values in this interval not displayed.   GFR: Estimated Creatinine Clearance: 32.1 mL/min (A) (by C-G formula based on SCr of 2.05 mg/dL (H)). Liver Function Tests: Recent Labs  Lab 02/19/23 0602 02/19/23 1734 02/20/23 0553 02/21/23 0617 02/21/23 1613 02/22/23 0600 02/23/23 0400 02/24/23 0450  AST 32 35  --   --   --   --   --   --   ALT 24 22  --   --   --   --   --   --   ALKPHOS 97 64  --   --   --   --   --   --   BILITOT 0.8 1.5*  --   --   --   --   --   --   PROT 5.1* 4.1*  --   --   --   --   --   --   ALBUMIN 2.2* 2.2*   < > 2.1* 1.8* 2.0* 2.1* 2.2*   < > = values in this interval not displayed.   No results for input(s): "LIPASE", "AMYLASE" in the last 168 hours. No results for input(s): "AMMONIA" in the last 168 hours. Coagulation Profile: Recent Labs  Lab 02/19/23 0602  INR 1.2   Cardiac Enzymes: No results for input(s): "CKTOTAL", "CKMB", "CKMBINDEX", "TROPONINI" in the last 168 hours. BNP (last 3 results) No results for input(s): "PROBNP" in the last 8760 hours. HbA1C: No results for input(s): "HGBA1C" in the last 72 hours. CBG: Recent Labs  Lab 02/22/23 1944 02/23/23 0003 02/23/23 0403 02/23/23 0751 02/23/23 1139  GLUCAP 81 98 111* 117* 100*   Lipid Profile: No results for input(s): "CHOL", "HDL", "LDLCALC", "TRIG", "CHOLHDL", "LDLDIRECT" in the last 72 hours. Thyroid Function Tests: No results for input(s): "TSH", "T4TOTAL", "FREET4", "T3FREE", "THYROIDAB" in the last 72 hours. Anemia Panel: No results for input(s): "VITAMINB12", "FOLATE", "FERRITIN", "TIBC", "IRON", "RETICCTPCT" in the last 72 hours. Most Recent Urinalysis On File:     Component Value Date/Time   COLORURINE YELLOW (A) 01/29/2023 2227   APPEARANCEUR HAZY (A) 01/29/2023  2227   LABSPEC 1.014 01/29/2023 2227   PHURINE 5.0 01/29/2023  2227   GLUCOSEU NEGATIVE 01/29/2023 2227   HGBUR MODERATE (A) 01/29/2023 [place] 01/29/2023 2227   KETONESUR 5 (A) 01/29/2023 2227   PROTEINUR NEGATIVE 01/29/2023 2227   NITRITE NEGATIVE 01/29/2023 2227   LEUKOCYTESUR TRACE (A) 01/29/2023 2227   Sepsis Labs: '@LABRCNTIP'$ (procalcitonin:4,lacticidven:4) Microbiology: No results found for this or any previous visit (from the past 240 hour(s)).    Radiology Studies last 3 days: No results found.           LOS: 26 days       Emeterio Reeve, DO [facility] 02/24/2023, 11:55 AM    Dictation software may have been used to generate the above note. Typos may occur and escape review in typed/dictated notes. Please contact Dr Sheppard Coil directly for clarity if needed.  Staff may message me via secure chat in [place]  but this may not receive an immediate response,  please page me for urgent matters!  If 7PM-7AM, please contact night coverage [email or web address]

## 2023-02-24 NOTE — Progress Notes (Signed)
[name]  PROGRESS NOTE   Subjective:   Patient seen sitting up in bed Remains on 2L [place], denies shortness of breath No lower extremity edema Appetite slowly improving  Objective:  Vital signs: Blood pressure 90/67, pulse 93, temperature 97.6 F (36.4 C), temperature source Oral, resp. rate (!) 24, height '5\' 11"'$  (1.803 m), weight 78.3 kg, SpO2 98 %.  Intake/Output Summary (Last 24 hours) at 02/24/2023 1209 Last data filed at 02/24/2023 0500 Gross per 24 hour  Intake 440 ml  Output 900 ml  Net -460 ml    Filed Weights   02/22/23 0500 02/23/23 0423 02/24/23 0452  Weight: 81.2 kg 82 kg 78.3 kg     Physical Exam: General:  No acute distress  Head:  Normocephalic, atraumatic. Moist oral mucosal membranes  Eyes:  Anicteric  Lungs:   Clear to auscultation, normal effort  Heart:  S1S2 no rubs  Abdomen:   Soft, nontender, bowel sounds present  Extremities:  No peripheral edema.  Neurologic:  Awake, alert, following commands  Skin:  No lesions  Access: Lt femoral temp cath    Basic Metabolic Panel: Recent Labs  Lab 02/20/23 0553 02/20/23 0646 02/21/23 0616 02/21/23 0617 02/21/23 1613 02/22/23 0600 02/23/23 0400 02/23/23 0407 02/24/23 0450  NA 138   < >  --  137 135 135 135  --  134*  135  K 6.6*   < >  --  3.8 4.0 3.8 3.8  --  3.8  3.8  CL 106   < >  --  100 101 102 102  --  102  103  CO2 25   < >  --  '31 30 30 29  '$ --  26  28  GLUCOSE 130*   < >  --  161* 142* 134* 115*  --  119*  120*  BUN 82*   < >  --  41* 29* 22 26*  --  27*  28*  CREATININE 2.91*   < >  --  1.69* 1.48* 1.39* 1.82*  --  2.05*  2.06*  CALCIUM 8.3*   < >  --  7.9* 7.8* 8.1* 8.0*  --  8.3*  8.4*  MG 1.8  --  1.7  --   --  1.7  --  1.9 2.0  PHOS 4.5   < >  --  2.9 2.6 2.8 2.8  --  2.7   < > = values in this interval not displayed.    GFR: Estimated Creatinine Clearance: 32.1 mL/min (A) (by C-G formula based on SCr of 2.05 mg/dL (H)).  Liver Function Tests: Recent Labs   Lab 02/19/23 0602 02/19/23 1734 02/20/23 0553 02/21/23 0617 02/21/23 1613 02/22/23 0600 02/23/23 0400 02/24/23 0450  AST 32 35  --   --   --   --   --   --   ALT 24 22  --   --   --   --   --   --   ALKPHOS 97 64  --   --   --   --   --   --   BILITOT 0.8 1.5*  --   --   --   --   --   --   PROT 5.1* 4.1*  --   --   --   --   --   --   ALBUMIN 2.2* 2.2*   < > 2.1* 1.8* 2.0* 2.1* 2.2*   < > = values in  this interval not displayed.    No results for input(s): "LIPASE", "AMYLASE" in the last 168 hours. No results for input(s): "AMMONIA" in the last 168 hours.  CBC: Recent Labs  Lab 02/18/23 0730 02/19/23 0602 02/19/23 0840 02/19/23 1734 02/19/23 2325 02/20/23 0646 02/20/23 1426 02/21/23 1001 02/21/23 1613 02/21/23 2305 02/22/23 0600 02/23/23 0407  WBC 6.1 9.1  --  16.0*  --  33.1*  --   --   --   --   --  12.1*  NEUTROABS 4.9 7.2  --  12.4*  --   --   --   --   --   --   --   --   HGB 11.9* 9.3*   < > 7.6*   < > 8.2*   < > 7.2* 6.3* 6.9* 7.6* 7.5*  HCT 37.5* 30.0*   < > 25.2*   < > 25.4*   < > 23.0* 19.8* 22.0* 24.5* 24.4*  MCV 101.9* 103.1*  --  98.1  --  93.4  --   --   --   --   --  96.1  PLT 184 192  --  129*  --  129*  --   --   --   --   --  66*   < > = values in this interval not displayed.      HbA1C: Hgb A1c MFr Bld  Date/Time Value Ref Range Status  10/02/2022 11:16 PM 6.3 (H) 4.8 - 5.6 % Final    Comment:    (NOTE) Pre diabetes:          5.7%-6.4%  Diabetes:              >6.4%  Glycemic control for   <7.0% adults with diabetes     Urinalysis: No results for input(s): "COLORURINE", "LABSPEC", "PHURINE", "GLUCOSEU", "HGBUR", "BILIRUBINUR", "KETONESUR", "PROTEINUR", "UROBILINOGEN", "NITRITE", "LEUKOCYTESUR" in the last 72 hours.  Invalid input(s): "APPERANCEUR"    Imaging: No results found.   Medications:    sodium chloride Stopped (02/21/23 0330)    vitamin C  500 mg Oral BID   Chlorhexidine Gluconate Cloth  6 each Topical Daily    feeding supplement  237 mL Oral TID BM   multivitamin  1 tablet Oral QHS   pantoprazole (PROTONIX) IV  40 mg Intravenous Q12H    Assessment/ Plan:     Kasen Rammer is a 77 y.o. black male with chronic diastolic congestive heart failure, hypertension, COPD, hyperlipidemia, who is admitted to [facility] on 01/29/2023 for Peripheral edema [R60.9] Shock circulatory ([place]) [R57.9] Acute on chronic congestive heart failure, unspecified heart failure type ([place]) [I50.9] Acute renal failure superimposed on chronic kidney disease, unspecified acute renal failure type, unspecified CKD stage (HCC) [N17.9, N18.9]   #1: Acute kidney injury with chronic kidney disease stage IV. Acute kidney injury most likely secondary to cardiorenal syndrome  Developed hyperkalemia due to GI bleed and hypovolemic shock, was placed on CRRT on Saturday. CRRT terminated due to multiple clotting events.   Renal function stable at this time. UOP 900 ml in proceeding 24 hours. No acute indication for dialysis today. Will continue to monitor.    #2 Acute respiratory failure requiring intubation: Extubated on 02/05/23.   Remains on [place] 2L, no distress noted.    #3: Hypotension: Patient had cardiogenic shock requiring pressors.  No pressors. Blood pressure stable.   #4: Anemia with chronic kidney disease. Hgb below desired target but stable.     LOS: [place]  [facility] 3/11/202412:09 PM

## 2023-02-25 DIAGNOSIS — R579 Shock, unspecified: Secondary | ICD-10-CM | POA: Diagnosis not present

## 2023-02-25 LAB — RENAL FUNCTION PANEL
Albumin: 2.3 g/dL — ABNORMAL LOW (ref 3.5–5.0)
Anion gap: 6 (ref 5–15)
BUN: 28 mg/dL — ABNORMAL HIGH (ref 8–23)
CO2: 28 mmol/L (ref 22–32)
Calcium: 8.3 mg/dL — ABNORMAL LOW (ref 8.9–10.3)
Chloride: 101 mmol/L (ref 98–111)
Creatinine, Ser: 2.11 mg/dL — ABNORMAL HIGH (ref 0.61–1.24)
GFR, Estimated: 32 mL/min — ABNORMAL LOW (ref >60)
Glucose, Bld: 125 mg/dL — ABNORMAL HIGH (ref 70–99)
Phosphorus: 2.5 mg/dL (ref 2.5–4.6)
Potassium: 4 mmol/L (ref 3.5–5.1)
Sodium: 135 mmol/L (ref 135–145)

## 2023-02-25 LAB — MAGNESIUM: Magnesium: 1.9 mg/dL (ref 1.7–2.4)

## 2023-02-25 LAB — CBC
HCT: 25.7 % — ABNORMAL LOW (ref 39.0–52.0)
Hemoglobin: 8 g/dL — ABNORMAL LOW (ref 13.0–17.0)
MCH: 30.1 pg (ref 26.0–34.0)
MCHC: 31.1 g/dL (ref 30.0–36.0)
MCV: 96.6 fL (ref 80.0–100.0)
Platelets: 103 10*3/uL — ABNORMAL LOW (ref 150–400)
RBC: 2.66 MIL/uL — ABNORMAL LOW (ref 4.22–5.81)
RDW: 16.9 % — ABNORMAL HIGH (ref 11.5–15.5)
WBC: 11 10*3/uL — ABNORMAL HIGH (ref 4.0–10.5)
nRBC: 0.2 % (ref 0.0–0.2)

## 2023-02-25 MED ORDER — PANTOPRAZOLE SODIUM 40 MG PO TBEC
40.0000 mg | DELAYED_RELEASE_TABLET | Freq: Two times a day (BID) | ORAL | Status: DC
  Administered 2023-02-25 – 2023-03-04 (×14): 40 mg via ORAL
  Filled 2023-02-25 (×14): qty 1

## 2023-02-25 NOTE — Progress Notes (Signed)
PROGRESS NOTE    John [name]   [id number] DOB: 1946/01/02  DOA: 01/29/2023 Date of Service: 02/25/23 PCP: Alisa Graff, FNP     Brief Narrative / Hospital Course:  77 year old male with a history of essential hypertension, on hydralazine and Cozaar, stage IV CKD but has not been on dialysis in the past, has chronic diastolic and systolic heart failure with cor pulmonale, moderate tricuspid regurgitation and aortic valve regurgitation, history of ascending aortic aneurysm, coronary artery disease with atherosclerosis, COPD and chronic hypoxemia baseline 2 L/min supplemental oxygen at home with recurrent episodes of hypoxemia and remote acute exacerbation of COPD with hypoxemia and hypercapnia 2 months ago history of ruptured left quadricep tendon 2 years ago, chronic thrombocytopenia and erythrocytosis recurrent lower extremity cellulitis, recurrent hyperkalemia, morbid obesity history of anasarca dyslipidemia, recent admission for acute CHF exacerbation requiring Lasix drip and BiPAP support, who was brought in by EMS due to worsening altered mental status and circulatory shock.    2/14: intubated, admitted to ICU for shock, CRRT 2/15- Echo w/ RV dysfunction and concern for PE. CTPE today.  He is weaned down on pressors  2/16- remains agitated intermittently overnight. Continued issues with clot burden through CRRT circuit. 2/17- remains agitated overnight. Requiring numerous ativan pushes. Attempting to wean down sedation. Failed SBT . 2/18- issues with CRRT overnight. Will attempt higher UF today. Assess for possible chest tube placement.  2/19- No issues with CRRT overnight, currently running without complication.  CT Head obtained overnight and negative for acute process. Off Precedex this morning, somnolent. SBT as mental status permits. Stop Cefepime, checking ammonia. 2/20- Remains on CRRT. Pt more awake and tracking.Tolerated SBT for 12 hrs  2/21- Pt remains mechanically  intubated on minimal vent settings.  Pt able to track and open eyes on command, however unable to move BLE 2/22- Off vasopressors.  Palliative Care consulted pt stating he DOES NOT want any form of dialysis.  If pt remains stable will transfer service to [facility] later changed his mind on dialysis.  2/26: Placed perm cath for HD 2/27: status post hemodialysis.  Doing reasonably well.  Mental status improved.  Is pretty clear with [place] now that he does not want hemodialysis. 2/28 -patient has gone back and forth about wanting dialysis and not wanting dialysis.  He told the palliative team this morning that he did not want any further dialysis: Back to room states that he wants everything done now.  Nephrology is going to hold dialysis today and tomorrow and evaluate for Friday to see if he needs it given that he had dialysis in the last 2 days.  The dialysis catheter was placed 2 days ago is working well and patient is healing recovering as expected per the vascular team and they are recommending outpatient follow-up. 2/29: Dialysis held today and yesterday.  Monitoring for renal recovery. Creatinine started to rise.  Nephrology starting Lasix 80 mg IV. 3/4 removal of permcath. He made decision for no dialysis and as such dialysis catheter was removed  3/5: stable awaiting SNF  3/6 large volume hematochezia, duodenal bleed on CTA, EGD done, extubated post-procedure and on pressor support, s/p 3 units PRBC. States he wants everything done to try to live, except CPR 3/7 Started on CRRT. Reiterates no CPR or repeat intubation in event of respiratory distress but intubation for procedures is acceptable. OK w/ dialysis.  3/8 stable still on pressors and CRRT, potassium stable/WNL, more alert and conversational today, full liquid diet, GI team s/o.  Received another unit PRBC.  3/9: off CRRT d/t clotting episodes, off pressors. O2 support max 10L [place] but down to 4L [place] in afternoon.  3/10-03/11: remains stable VS and  labs, down to 2L [place]. Transfer out of stepdown.  03/12: Plt improving, renal fxn stable, remains on 2L , will move to MedSurg and order PT/OT assessment, anticipate medical stability over next few days.     Consultants:  Gastroenterology  Palliative Care  Cardiology Nephrology Vascular Surgery  ICU  Procedures: 02/14: intubation  CRRT 02/14-02/20 Emergent EGD 02/19/23 for duodenal bleed 01/29/23 Insertion of Non-tunneled Central Venous Catheter with US guidance  CRRT 03/07-03/09        ASSESSMENT & PLAN:   Principal Problem:   Shock circulatory ([place]) Active Problems:   Acute on chronic congestive heart failure ([place])   Acute hypoxic respiratory failure (HCC)   Toxic metabolic encephalopathy  Hypovolemic shock d/t ABLA - GI bleed Acute blood loss anemia on anemia of chronic disease  Trend CBC/HH and transfuse as needed  Protonix IV bid  Pressors to titrate --> off and BP ok  No anticoags  GI has s/o   Hyperkalemia - resolved Treating as needed and following BMP  Cardiogenic shock POA d/t HFpEF - improved but now hypovolemic shock d/t GI bleed and Acute decompensated HFpEF and right-sided heart failure RV dysfunction and dilation on echocardiography, concerning for RV failure and pulmonary hypertension. Porto-pulmonary hypertension on the differential given history of liver cirrhosis.  02/19/23 w/ GI bleed and hypovolemic shock he underwent aggressive fluid resuscitation and PRBC pending EGD, known risk w/ CHF and previous dialysis - monitor fluid status closely  CVVH for ultra-filtration for volume removal --> stopped  Cardiology had previously signed off, pt appears euvolemic  Nephrology is on board we appreciate input, may consider initiating diuretic    Acute Hypoxic Respiratory Failure d/t HFpEF and hypovolemic shock Continue supplemental O2 as needed to maintain SpO2 >92% NO REINTUBATION if respiratory distress    Toxic Metabolic Encephalopathy - improved  but waxes and wanes I judge his decision-making capacity to be limited but not absent. He is improving daily.   See [place] discussions below   Acute kidney injury on chronic kidney disease stage IV CRRT d/c Nephrology following    Hepatic cirrhosis Thrombocytopenia.   Was on cefepime empirically given leukocytosis.  Cultures remain negative. Finished 5 days of antibiotics.  Monitor CBC, CMP  Goals of Care I judge his capacity to be limited but not absent. He appears to have a basic understanding of his situation and his options, though lethargy precludes him communicating in any great detail. He appears more alert and is more communicative 02/20/23 - 02/21/23 Intubation for procedural intervention w/ anesthesia does not violate a DNR and patient assents to this if needed  He has expressed clear general goals -  wants to do what is possible to live if medical interventions to reach this goal are judged to be futile, and/or if respiratory or cardiac distress/arrest occurs, will transition to comfort care He would like not to be kept on long term life support machines (ie if intubated for procedure and not able to wen off vent in a couple days, would like to be transitioned to comfort care)  Remains DNR I made clear to him that if he decompensates to the point that intubation is needed to maintain respiration, I do not think he would come off the ventilator - he agrees DO NOT INTUBATE IN SUCH A CASE.  I made clear to him that if he experiences severe cardiac arrhythmia or arrest, I do not think he would meaningfully recovery cardiac function despite aggressive intervention - he agrees NO CPR/DEFIBRILLATION.  in absence of ideal decision-making capacity, and in absence of a designated surrogate decision-maker or [place] other than his attending physician (in cooperation their consulting physicians), it is ethically appropriate to treat with presumed goal of preserving life, but withholding  or discontinuing treatments which are judged by 2+ physicians to be medically futile (futile = would at most only serve to delay inevitable and certain death).      DVT prophylaxis: SCD Pertinent IV fluids/nutrition: diet as tolerated  Central lines / invasive devices: HD cath L femoral, CVC R femoral, foley   Code Status: DNR  Current Admission Status: inpatient, med tele  TOC needs / Dispo plan: SNF Barriers to discharge / significant pending items: if remaining stable may be able to go to SNF in a few days              Subjective / Brief ROS:  Patient more alert today. Denies pain, SOB. Reports tolerating diet but appetite is low.  Feeling more energetic today.   Family Communication: none at this time     Objective Findings:  Vitals:   02/25/23 1000 02/25/23 1100 02/25/23 1200 02/25/23 1300  BP: 112/75 114/75 112/79 97/67  Pulse: 93 93 96   Resp: 19 20 (!) 25 (!) 25  Temp:   (!) 97.3 F (36.3 C)   TempSrc:   Oral   SpO2: 100% 96% 96%   Weight:      Height:        Intake/Output Summary (Last 24 hours) at 02/25/2023 1355 Last data filed at 02/25/2023 1000 Gross per 24 hour  Intake 865 ml  Output 1575 ml  Net -710 ml   Filed Weights   02/23/23 0423 02/24/23 0452 02/25/23 0500  Weight: 82 kg 78.3 kg 84.4 kg    Examination:  Physical Exam Constitutional:      General: He is not in acute distress.    Appearance: He is ill-appearing.  HENT:     Mouth/Throat:     Mouth: Mucous membranes are dry.  Cardiovascular:     Rate and Rhythm: Normal rate and regular rhythm.     Heart sounds: Normal heart sounds.  Pulmonary:     Effort: Pulmonary effort is normal. No respiratory distress.     Breath sounds: Normal breath sounds.  Abdominal:     Palpations: Abdomen is soft.  Musculoskeletal:     Right lower leg: Right lower leg edema: trace.     Left lower leg: Left lower leg edema: trace.  Skin:    General: Skin is warm and dry.  Neurological:      General: No focal deficit present.     Mental Status: He is alert.          Scheduled Medications:   vitamin C  500 mg Oral BID   Chlorhexidine Gluconate Cloth  6 each Topical Daily   feeding supplement  237 mL Oral TID BM   multivitamin  1 tablet Oral QHS   pantoprazole  40 mg Oral BID    Continuous Infusions:  sodium chloride Stopped (02/21/23 0330)    PRN Medications:  mouth rinse  Antimicrobials from admission:  Anti-infectives (From admission, onward)    Start     Dose/Rate Route Frequency Ordered Stop   02/19/23 0645  cefTRIAXone (  ROCEPHIN) 1 g in sodium chloride 0.9 % 100 mL IVPB  Status:  Discontinued        1 g 200 mL/hr over 30 Minutes Intravenous Every 24 hours 02/19/23 0556 02/20/23 0844   02/11/23 0000  ceFAZolin (ANCEF) IVPB 1 g/50 mL premix       Note to Pharmacy: Send with pt to OR   1 g 100 mL/hr over 30 Minutes Intravenous On call 02/10/23 0908 02/10/23 0904   01/31/23 1700  ceFEPIme (MAXIPIME) 2 g in sodium chloride 0.9 % 100 mL IVPB  Status:  Discontinued        2 g 200 mL/hr over 30 Minutes Intravenous Every 12 hours 01/31/23 1555 02/03/23 1048   01/30/23 1700  vancomycin (VANCOREADY) IVPB 1250 mg/250 mL  Status:  Discontinued        1,250 mg 166.7 mL/hr over 90 Minutes Intravenous Every 24 hours 01/29/23 1543 01/30/23 1036   01/30/23 1300  Ampicillin-Sulbactam (UNASYN) 3 g in sodium chloride 0.9 % 100 mL IVPB  Status:  Discontinued        3 g 200 mL/hr over 30 Minutes Intravenous Every 8 hours 01/30/23 1036 01/31/23 1118   01/30/23 0000  vancomycin variable dose per unstable renal function (pharmacist dosing)  Status:  Discontinued         Does not apply See admin instructions 01/29/23 1125 01/29/23 1548   01/29/23 2200  piperacillin-tazobactam (ZOSYN) IVPB 3.375 g  Status:  Discontinued        3.375 g 12.5 mL/hr over 240 Minutes Intravenous Every 6 hours 01/29/23 1547 01/29/23 1548   01/29/23 2200  piperacillin-tazobactam (ZOSYN) IVPB 3.375 g   Status:  Discontinued        3.375 g 100 mL/hr over 30 Minutes Intravenous Every 6 hours 01/29/23 1548 01/30/23 1036   01/29/23 2100  piperacillin-tazobactam (ZOSYN) IVPB 2.25 g  Status:  Discontinued        2.25 g 100 mL/hr over 30 Minutes Intravenous Every 8 hours 01/29/23 1125 01/29/23 1547   01/29/23 1115  piperacillin-tazobactam (ZOSYN) IVPB 3.375 g        3.375 g 100 mL/hr over 30 Minutes Intravenous  Once 01/29/23 1103 01/29/23 1457   01/29/23 1115  vancomycin (VANCOREADY) IVPB 2000 mg/400 mL        2,000 mg 200 mL/hr over 120 Minutes Intravenous  Once 01/29/23 1104 01/29/23 1656           Data Reviewed:  I have personally reviewed the following...  CBC: Recent Labs  Lab 02/19/23 0602 02/19/23 0840 02/19/23 1734 02/19/23 2325 02/20/23 0646 02/20/23 1426 02/21/23 1613 02/21/23 2305 02/22/23 0600 02/23/23 0407 02/25/23 0530  WBC 9.1  --  16.0*  --  33.1*  --   --   --   --  12.1* 11.0*  NEUTROABS 7.2  --  12.4*  --   --   --   --   --   --   --   --   HGB 9.3*   < > 7.6*   < > 8.2*   < > 6.3* 6.9* 7.6* 7.5* 8.0*  HCT 30.0*   < > 25.2*   < > 25.4*   < > 19.8* 22.0* 24.5* 24.4* 25.7*  MCV 103.1*  --  98.1  --  93.4  --   --   --   --  96.1 96.6  PLT 192  --  129*  --  129*  --   --   --   --  66* 103*   < > = values in this interval not displayed.   Basic Metabolic Panel: Recent Labs  Lab 02/21/23 0616 02/21/23 0617 02/21/23 1613 02/22/23 0600 02/23/23 0400 02/23/23 0407 02/24/23 0450 02/25/23 0530  NA  --    < > 135 135 135  --  134*  135 135  K  --    < > 4.0 3.8 3.8  --  3.8  3.8 4.0  CL  --    < > 101 102 102  --  102  103 101  CO2  --    < > '30 30 29  '$ --  '26  28 28  '$ GLUCOSE  --    < > 142* 134* 115*  --  119*  120* 125*  BUN  --    < > 29* 22 26*  --  27*  28* 28*  CREATININE  --    < > 1.48* 1.39* 1.82*  --  2.05*  2.06* 2.11*  CALCIUM  --    < > 7.8* 8.1* 8.0*  --  8.3*  8.4* 8.3*  MG 1.7  --   --  1.7  --  1.9 2.0 1.9  PHOS  --    <  > 2.6 2.8 2.8  --  2.7 2.5   < > = values in this interval not displayed.   GFR: Estimated Creatinine Clearance: 31.2 mL/min (A) (by C-G formula based on SCr of 2.11 mg/dL (H)). Liver Function Tests: Recent Labs  Lab 02/19/23 0602 02/19/23 1734 02/20/23 0553 02/21/23 1613 02/22/23 0600 02/23/23 0400 02/24/23 0450 02/25/23 0530  AST 32 35  --   --   --   --   --   --   ALT 24 22  --   --   --   --   --   --   ALKPHOS 97 64  --   --   --   --   --   --   BILITOT 0.8 1.5*  --   --   --   --   --   --   PROT 5.1* 4.1*  --   --   --   --   --   --   ALBUMIN 2.2* 2.2*   < > 1.8* 2.0* 2.1* 2.2* 2.3*   < > = values in this interval not displayed.   No results for input(s): "LIPASE", "AMYLASE" in the last 168 hours. No results for input(s): "AMMONIA" in the last 168 hours. Coagulation Profile: Recent Labs  Lab 02/19/23 0602  INR 1.2   Cardiac Enzymes: No results for input(s): "CKTOTAL", "CKMB", "CKMBINDEX", "TROPONINI" in the last 168 hours. BNP (last 3 results) No results for input(s): "PROBNP" in the last 8760 hours. HbA1C: No results for input(s): "HGBA1C" in the last 72 hours. CBG: Recent Labs  Lab 02/22/23 1944 02/23/23 0003 02/23/23 0403 02/23/23 0751 02/23/23 1139  GLUCAP 81 98 111* 117* 100*   Lipid Profile: No results for input(s): "CHOL", "HDL", "LDLCALC", "TRIG", "CHOLHDL", "LDLDIRECT" in the last 72 hours. Thyroid Function Tests: No results for input(s): "TSH", "T4TOTAL", "FREET4", "T3FREE", "THYROIDAB" in the last 72 hours. Anemia Panel: No results for input(s): "VITAMINB12", "FOLATE", "FERRITIN", "TIBC", "IRON", "RETICCTPCT" in the last 72 hours. Most Recent Urinalysis On File:     Component Value Date/Time   COLORURINE YELLOW (A) 01/29/2023 2227   APPEARANCEUR HAZY (A) 01/29/2023 2227   LABSPEC 1.014 01/29/2023 2227   PHURINE 5.0 01/29/2023 2227  GLUCOSEU NEGATIVE 01/29/2023 2227   HGBUR MODERATE (A) 01/29/2023 2227   BILIRUBINUR NEGATIVE  01/29/2023 2227   KETONESUR 5 (A) 01/29/2023 2227   PROTEINUR NEGATIVE 01/29/2023 2227   NITRITE NEGATIVE 01/29/2023 2227   LEUKOCYTESUR TRACE (A) 01/29/2023 2227   Sepsis Labs: '@LABRCNTIP'$ (procalcitonin:4,lacticidven:4) Microbiology: No results found for this or any previous visit (from the past 240 hour(s)).    Radiology Studies last 3 days: No results found.           LOS: 27 days       Emeterio Reeve, DO [facility] 02/25/2023, 1:55 PM    Dictation software may have been used to generate the above note. Typos may occur and escape review in typed/dictated notes. Please contact Dr Sheppard Coil directly for clarity if needed.  Staff may message me via secure chat in [place]  but this may not receive an immediate response,  please page me for urgent matters!  If 7PM-7AM, please contact night coverage [email or web address]

## 2023-02-25 NOTE — Progress Notes (Signed)
[name]  PROGRESS NOTE   Subjective:   Patient sitting up in bed Alert and oriented Continues to complain of weakness Partially completed breakfast tray at bedside  Creatinine 2.11 UOP 935m  Objective:  Vital signs: Blood pressure 108/70, pulse 100, temperature 97.6 F (36.4 C), temperature source Oral, resp. rate (!) 22, height '5\' 11"'$  (1.803 m), weight 84.4 kg, SpO2 99 %.  Intake/Output Summary (Last 24 hours) at 02/25/2023 1001 Last data filed at 02/25/2023 1000 Gross per 24 hour  Intake 865 ml  Output 1575 ml  Net -710 ml    Filed Weights   02/23/23 0423 02/24/23 0452 02/25/23 0500  Weight: 82 kg 78.3 kg 84.4 kg     Physical Exam: General:  No acute distress  Head:  Normocephalic, atraumatic. Moist oral mucosal membranes  Eyes:  Anicteric  Lungs:   Clear to auscultation, normal effort  Heart:  S1S2 no rubs  Abdomen:   Soft, nontender, bowel sounds present  Extremities:  No peripheral edema.  Neurologic:  Awake, alert, following commands  Skin:  No lesions  Access: Lt femoral temp cath    Basic Metabolic Panel: Recent Labs  Lab 02/21/23 0616 02/21/23 0617 02/21/23 1613 02/22/23 0600 02/23/23 0400 02/23/23 0407 02/24/23 0450 02/25/23 0530  NA  --    < > 135 135 135  --  134*  135 135  K  --    < > 4.0 3.8 3.8  --  3.8  3.8 4.0  CL  --    < > 101 102 102  --  102  103 101  CO2  --    < > '30 30 29  '$ --  '26  28 28  '$ GLUCOSE  --    < > 142* 134* 115*  --  119*  120* 125*  BUN  --    < > 29* 22 26*  --  27*  28* 28*  CREATININE  --    < > 1.48* 1.39* 1.82*  --  2.05*  2.06* 2.11*  CALCIUM  --    < > 7.8* 8.1* 8.0*  --  8.3*  8.4* 8.3*  MG 1.7  --   --  1.7  --  1.9 2.0 1.9  PHOS  --    < > 2.6 2.8 2.8  --  2.7 2.5   < > = values in this interval not displayed.    GFR: Estimated Creatinine Clearance: 31.2 mL/min (A) (by C-G formula based on SCr of 2.11 mg/dL (H)).  Liver Function Tests: Recent Labs  Lab 02/19/23 0602  02/19/23 1734 02/20/23 0553 02/21/23 1613 02/22/23 0600 02/23/23 0400 02/24/23 0450 02/25/23 0530  AST 32 35  --   --   --   --   --   --   ALT 24 22  --   --   --   --   --   --   ALKPHOS 97 64  --   --   --   --   --   --   BILITOT 0.8 1.5*  --   --   --   --   --   --   PROT 5.1* 4.1*  --   --   --   --   --   --   ALBUMIN 2.2* 2.2*   < > 1.8* 2.0* 2.1* 2.2* 2.3*   < > = values in this interval not displayed.    No results for input(s): "  LIPASE", "AMYLASE" in the last 168 hours. No results for input(s): "AMMONIA" in the last 168 hours.  CBC: Recent Labs  Lab 02/19/23 0602 02/19/23 0840 02/19/23 1734 02/19/23 2325 02/20/23 0646 02/20/23 1426 02/21/23 1613 02/21/23 2305 02/22/23 0600 02/23/23 0407 02/25/23 0530  WBC 9.1  --  16.0*  --  33.1*  --   --   --   --  12.1* 11.0*  NEUTROABS 7.2  --  12.4*  --   --   --   --   --   --   --   --   HGB 9.3*   < > 7.6*   < > 8.2*   < > 6.3* 6.9* 7.6* 7.5* 8.0*  HCT 30.0*   < > 25.2*   < > 25.4*   < > 19.8* 22.0* 24.5* 24.4* 25.7*  MCV 103.1*  --  98.1  --  93.4  --   --   --   --  96.1 96.6  PLT 192  --  129*  --  129*  --   --   --   --  66* 103*   < > = values in this interval not displayed.      HbA1C: Hgb A1c MFr Bld  Date/Time Value Ref Range Status  10/02/2022 11:16 PM 6.3 (H) 4.8 - 5.6 % Final    Comment:    (NOTE) Pre diabetes:          5.7%-6.4%  Diabetes:              >6.4%  Glycemic control for   <7.0% adults with diabetes     Urinalysis: No results for input(s): "COLORURINE", "LABSPEC", "PHURINE", "GLUCOSEU", "HGBUR", "BILIRUBINUR", "KETONESUR", "PROTEINUR", "UROBILINOGEN", "NITRITE", "LEUKOCYTESUR" in the last 72 hours.  Invalid input(s): "APPERANCEUR"    Imaging: No results found.   Medications:    sodium chloride Stopped (02/21/23 0330)    vitamin C  500 mg Oral BID   Chlorhexidine Gluconate Cloth  6 each Topical Daily   feeding supplement  237 mL Oral TID BM   multivitamin  1 tablet  Oral QHS   pantoprazole (PROTONIX) IV  40 mg Intravenous Q12H    Assessment/ Plan:     John [name] is a 77 y.o. black male with chronic diastolic congestive heart failure, hypertension, COPD, hyperlipidemia, who is admitted to [facility] on 01/29/2023 for Peripheral edema [R60.9] Shock circulatory ([place]) [R57.9] Acute on chronic congestive heart failure, unspecified heart failure type ([place]) [I50.9] Acute renal failure superimposed on chronic kidney disease, unspecified acute renal failure type, unspecified CKD stage (HCC) [N17.9, N18.9]   #1: Acute kidney injury with chronic kidney disease stage IV. Acute kidney injury most likely secondary to cardiorenal syndrome  Developed hyperkalemia due to GI bleed and hypovolemic shock, was placed on CRRT on Saturday. CRRT terminated due to multiple clotting events.   Creatinine remains stable for this patient. Nonoliguric. May consider diuretic initiation in the near future. No acute need for dialysis at this time. Continuing to monitor closely    #2 Acute respiratory failure requiring intubation: Extubated on 02/05/23.   Maintained on 2L [place]   #3: Hypotension: Patient had cardiogenic shock requiring pressors.  No pressors.   #4: Anemia with chronic kidney disease. Hgb improved today, remains below target.     LOS: [facility] 3/12/202410:01 AM

## 2023-02-26 DIAGNOSIS — R579 Shock, unspecified: Secondary | ICD-10-CM | POA: Diagnosis not present

## 2023-02-26 LAB — RENAL FUNCTION PANEL
Albumin: 2.4 g/dL — ABNORMAL LOW (ref 3.5–5.0)
Anion gap: 4 — ABNORMAL LOW (ref 5–15)
BUN: 29 mg/dL — ABNORMAL HIGH (ref 8–23)
CO2: 28 mmol/L (ref 22–32)
Calcium: 8.5 mg/dL — ABNORMAL LOW (ref 8.9–10.3)
Chloride: 106 mmol/L (ref 98–111)
Creatinine, Ser: 2.12 mg/dL — ABNORMAL HIGH (ref 0.61–1.24)
GFR, Estimated: 31 mL/min — ABNORMAL LOW (ref >60)
Glucose, Bld: 137 mg/dL — ABNORMAL HIGH (ref 70–99)
Phosphorus: 2.2 mg/dL — ABNORMAL LOW (ref 2.5–4.6)
Potassium: 4.4 mmol/L (ref 3.5–5.1)
Sodium: 138 mmol/L (ref 135–145)

## 2023-02-26 LAB — MAGNESIUM: Magnesium: 1.8 mg/dL (ref 1.7–2.4)

## 2023-02-26 NOTE — Progress Notes (Signed)
PT Cancellation Note  Patient Details Name: John [name] MRN: [id number] DOB: 10/30/1946   Cancelled Treatment:    Reason Eval/Treat Not Completed: Medical issues which prohibited therapy: Pt with femoral temp cath at this time and per policy mobility contraindicated.  Per conversation with nursing temp cath likely to be removed later this date, will defer PT evaluation until pt appropriate for mobilization.    Linus Salmons PT, DPT 02/26/23, 8:32 AM

## 2023-02-26 NOTE — Progress Notes (Signed)
[name]  PROGRESS NOTE   Subjective:   Sitting up in bed Awaiting breakfast Denies pain or discomfort Denies shortness of breath, remains on 2L   UOP 975m  Objective:  Vital signs: Blood pressure 102/69, pulse 95, temperature (!) 97.3 F (36.3 C), temperature source Oral, resp. rate (!) 25, height '5\' 11"'$  (1.803 m), weight 80.7 kg, SpO2 100 %.  Intake/Output Summary (Last 24 hours) at 02/26/2023 1014 Last data filed at 02/26/2023 1011 Gross per 24 hour  Intake 887 ml  Output 800 ml  Net 87 ml    Filed Weights   02/24/23 0452 02/25/23 0500 02/26/23 0500  Weight: 78.3 kg 84.4 kg 80.7 kg     Physical Exam: General:  No acute distress  Head:  Normocephalic, atraumatic. Moist oral mucosal membranes  Eyes:  Anicteric  Lungs:   Clear to auscultation, normal effort  Heart:  S1S2 no rubs  Abdomen:   Soft, nontender, bowel sounds present  Extremities:  No peripheral edema.  Neurologic:  Awake, alert, following commands  Skin:  No lesions  Access: Lt femoral temp cath    Basic Metabolic Panel: Recent Labs  Lab 02/21/23 1613 02/22/23 0600 02/23/23 0400 02/23/23 0407 02/24/23 0450 02/25/23 0530 02/26/23 0607  NA 135 135 135  --  134*  135 135  --   K 4.0 3.8 3.8  --  3.8  3.8 4.0  --   CL 101 102 102  --  102  103 101  --   CO2 '30 30 29  '$ --  '26  28 28  '$ --   GLUCOSE 142* 134* 115*  --  119*  120* 125*  --   BUN 29* 22 26*  --  27*  28* 28*  --   CREATININE 1.48* 1.39* 1.82*  --  2.05*  2.06* 2.11*  --   CALCIUM 7.8* 8.1* 8.0*  --  8.3*  8.4* 8.3*  --   MG  --  1.7  --  1.9 2.0 1.9 1.8  PHOS 2.6 2.8 2.8  --  2.7 2.5  --     GFR: Estimated Creatinine Clearance: 31.2 mL/min (A) (by C-G formula based on SCr of 2.11 mg/dL (H)).  Liver Function Tests: Recent Labs  Lab 02/19/23 1734 02/20/23 0553 02/21/23 1613 02/22/23 0600 02/23/23 0400 02/24/23 0450 02/25/23 0530  AST 35  --   --   --   --   --   --   ALT 22  --   --   --   --   --    --   ALKPHOS 64  --   --   --   --   --   --   BILITOT 1.5*  --   --   --   --   --   --   PROT 4.1*  --   --   --   --   --   --   ALBUMIN 2.2*   < > 1.8* 2.0* 2.1* 2.2* 2.3*   < > = values in this interval not displayed.    No results for input(s): "LIPASE", "AMYLASE" in the last 168 hours. No results for input(s): "AMMONIA" in the last 168 hours.  CBC: Recent Labs  Lab 02/19/23 1734 02/19/23 2325 02/20/23 0646 02/20/23 1426 02/21/23 1613 02/21/23 2305 02/22/23 0600 02/23/23 0407 02/25/23 0530  WBC 16.0*  --  33.1*  --   --   --   --  12.1* 11.0*  NEUTROABS 12.4*  --   --   --   --   --   --   --   --   HGB 7.6*   < > 8.2*   < > 6.3* 6.9* 7.6* 7.5* 8.0*  HCT 25.2*   < > 25.4*   < > 19.8* 22.0* 24.5* 24.4* 25.7*  MCV 98.1  --  93.4  --   --   --   --  96.1 96.6  PLT 129*  --  129*  --   --   --   --  66* 103*   < > = values in this interval not displayed.      HbA1C: Hgb A1c MFr Bld  Date/Time Value Ref Range Status  10/02/2022 11:16 PM 6.3 (H) 4.8 - 5.6 % Final    Comment:    (NOTE) Pre diabetes:          5.7%-6.4%  Diabetes:              >6.4%  Glycemic control for   <7.0% adults with diabetes     Urinalysis: No results for input(s): "COLORURINE", "LABSPEC", "PHURINE", "GLUCOSEU", "HGBUR", "BILIRUBINUR", "KETONESUR", "PROTEINUR", "UROBILINOGEN", "NITRITE", "LEUKOCYTESUR" in the last 72 hours.  Invalid input(s): "APPERANCEUR"    Imaging: No results found.   Medications:    sodium chloride Stopped (02/21/23 0330)    vitamin C  500 mg Oral BID   Chlorhexidine Gluconate Cloth  6 each Topical Daily   feeding supplement  237 mL Oral TID BM   multivitamin  1 tablet Oral QHS   pantoprazole  40 mg Oral BID    Assessment/ Plan:     John [name] is a 77 y.o. black male with chronic diastolic congestive heart failure, hypertension, COPD, hyperlipidemia, who is admitted to [facility] on 01/29/2023 for Peripheral edema [R60.9] Shock circulatory ([place])  [R57.9] Acute on chronic congestive heart failure, unspecified heart failure type ([place]) [I50.9] Acute renal failure superimposed on chronic kidney disease, unspecified acute renal failure type, unspecified CKD stage (HCC) [N17.9, N18.9]   #1: Acute kidney injury with chronic kidney disease stage IV. Acute kidney injury most likely secondary to cardiorenal syndrome  Developed hyperkalemia due to GI bleed and hypovolemic shock, was placed on CRRT on Saturday. CRRT terminated due to multiple clotting events.   Renal function stable, urine output acceptable. Order placed to remove Lt HD temp cath. No acute indication for dialysis.    #2 Acute respiratory failure requiring intubation: Extubated on 02/05/23.   Remains on 2L [place]   #3: Hypotension: Patient had cardiogenic shock requiring pressors.  No pressors. Blood pressure stable  #4: Anemia with chronic kidney disease. Will continue to monitor.     LOS: [facility] 3/13/202410:14 AM

## 2023-02-26 NOTE — Progress Notes (Signed)
Patient's PICC line on his right femoral is soiled with drainage. Patient has other sites to use for medications. Notified [place] NP to see if she wants it taken out. No new order received. Dressing on the PICC  changed.

## 2023-02-26 NOTE — Progress Notes (Signed)
Order received to remove both right fem TLC and left fem HD cath. Both lines removed and pt tolerated well. Pt has PIV in left arm.

## 2023-02-26 NOTE — Progress Notes (Signed)
OT Cancellation Note  Patient Details Name: Traevion Rarick MRN: [id number] DOB: 10-27-1946   Cancelled Treatment:    Reason Eval/Treat Not Completed: Medical issues which prohibited therapy. Re-eval received, but pt to have femoral line removed today and RN stated to hold. Will attempt re-eval tomorrow.   Vania Rea 02/26/2023, 10:17 AM

## 2023-02-26 NOTE — Progress Notes (Signed)
Progress Note   Patient: Cartez [name] [name] DOB: August 31, 1946 DOA: 01/29/2023     28 DOS: the patient was seen and examined on 02/26/2023    Objective: Patient was seen and examined at bedside this morning Dialysis catheter as well as femoral catheter removed today He appeared lethargic and was not interested in providing a lot of information Continues to require intranasal oxygen Denies chest pain nausea vomiting  Brief Narrative / Hospital Course:  77 year old male with a history of essential hypertension, on hydralazine and Cozaar, stage IV CKD but has not been on dialysis in the past, has chronic diastolic and systolic heart failure with cor pulmonale, moderate tricuspid regurgitation and aortic valve regurgitation, history of ascending aortic aneurysm, coronary artery disease with atherosclerosis, COPD and chronic hypoxemia baseline 2 L/min supplemental oxygen at home with recurrent episodes of hypoxemia and remote acute exacerbation of COPD with hypoxemia and hypercapnia 2 months ago history of ruptured left quadricep tendon 2 years ago, chronic thrombocytopenia and erythrocytosis recurrent lower extremity cellulitis, recurrent hyperkalemia, morbid obesity history of anasarca dyslipidemia, recent admission for acute CHF exacerbation requiring Lasix drip and BiPAP support, who was brought in by EMS due to worsening altered mental status and circulatory shock.     2/14: intubated, admitted to ICU for shock, CRRT 2/15- Echo w/ RV dysfunction and concern for PE. CTPE today.  He is weaned down on pressors  2/16- remains agitated intermittently overnight. Continued issues with clot burden through CRRT circuit. 2/17- remains agitated overnight. Requiring numerous ativan pushes. Attempting to wean down sedation. Failed SBT . 2/18- issues with CRRT overnight. Will attempt higher UF today. Assess for possible chest tube placement.  2/19- No issues with CRRT overnight, currently running  without complication.  CT Head obtained overnight and negative for acute process. Off Precedex this morning, somnolent. SBT as mental status permits. Stop Cefepime, checking ammonia. 2/20- Remains on CRRT. Pt more awake and tracking.Tolerated SBT for 12 hrs  2/21- Pt remains mechanically intubated on minimal vent settings.  Pt able to track and open eyes on command, however unable to move BLE 2/22- Off vasopressors.  Palliative Care consulted pt stating he DOES NOT want any form of dialysis.  If pt remains stable will transfer service to [facility] later changed his mind on dialysis.  2/26: Placed perm cath for HD 2/27: status post hemodialysis.  Doing reasonably well.  Mental status improved.  Is pretty clear with [place] now that he does not want hemodialysis. 2/28 -patient has gone back and forth about wanting dialysis and not wanting dialysis.  He told the palliative team this morning that he did not want any further dialysis: Back to room states that he wants everything done now.  Nephrology is going to hold dialysis today and tomorrow and evaluate for Friday to see if he needs it given that he had dialysis in the last 2 days.  The dialysis catheter was placed 2 days ago is working well and patient is healing recovering as expected per the vascular team and they are recommending outpatient follow-up. 2/29: Dialysis held today and yesterday.  Monitoring for renal recovery. Creatinine started to rise.  Nephrology starting Lasix 80 mg IV. 3/4 removal of permcath. He made decision for no dialysis and as such dialysis catheter was removed  3/5: stable awaiting SNF  3/6 large volume hematochezia, duodenal bleed on CTA, EGD done, extubated post-procedure and on pressor support, s/p 3 units PRBC. States he wants everything done to try to live, except CPR  3/7 Started on CRRT. Reiterates no CPR or repeat intubation in event of respiratory distress but intubation for procedures is acceptable. OK w/ dialysis.  3/8 stable  still on pressors and CRRT, potassium stable/WNL, more alert and conversational today, full liquid diet, GI team s/o. Received another unit PRBC.  3/9: off CRRT d/t clotting episodes, off pressors. O2 support max 10L HFNC but down to 4L [place] in afternoon.  3/10-03/11: remains stable VS and labs, down to 2L [place]. Transfer out of stepdown.  03/12: Plt improving, renal fxn stable, remains on 2L [place], will move to MedSurg and order PT/OT assessment, anticipate medical stability over next few days.  3/13-dialysis catheter removed today as patient has been cleared to have no indication for ongoing dialysis.       Consultants:  Gastroenterology  Palliative Care  Cardiology Nephrology Vascular Surgery  ICU   Procedures: 02/14: intubation  CRRT 02/14-02/20 Emergent EGD 02/19/23 for duodenal bleed 01/29/23 Insertion of Non-tunneled Central Venous Catheter with US guidance  CRRT 03/07-03/09          ASSESSMENT & PLAN:   Principal Problem:   Shock circulatory ([place]) Active Problems:   Acute on chronic congestive heart failure ([place])   Acute hypoxic respiratory failure (HCC)   Toxic metabolic encephalopathy   Hypovolemic shock d/t ABLA - GI bleed Acute blood loss anemia on anemia of chronic disease  Trend CBC/HH and transfuse as needed  Protonix IV bid  Currently off vasopressor support No anticoags  GI has s/o    Hyperkalemia - resolved Treating as needed and following BMP   Cardiogenic shock POA d/t HFpEF - improved but now hypovolemic shock d/t GI bleed and Acute decompensated HFpEF and right-sided heart failure RV dysfunction and dilation on echocardiography, concerning for RV failure and pulmonary hypertension. Porto-pulmonary hypertension on the differential given history of liver cirrhosis.  02/19/23 w/ GI bleed and hypovolemic shock he underwent aggressive fluid resuscitation and PRBC pending EGD, known risk w/ CHF and previous dialysis - monitor fluid status closely  CVVH for  ultra-filtration for volume removal --> stopped  Cardiology had previously signed off, pt appears euvolemic  Nephrology is on board we appreciate input, may consider initiating diuretic    Acute Hypoxic Respiratory Failure d/t HFpEF and hypovolemic shock Continue supplemental O2 as needed to maintain SpO2 >92% NO REINTUBATION if respiratory distress    Toxic Metabolic Encephalopathy - improved but waxes and wanes I judge his decision-making capacity to be limited but not absent. He is improving daily.      Acute kidney injury on chronic kidney disease stage IV CRRT d/c Nephrology following  Dialysis catheter removed 02/26/2023   Hepatic cirrhosis Thrombocytopenia.   Was on cefepime empirically given leukocytosis.  Cultures remain negative. Finished 5 days of antibiotics.  Monitor CBC, CMP     DVT prophylaxis: SCD Pertinent IV fluids/nutrition: diet as tolerated    Code Status: DNR   Current Admission Status: inpatient, med tele  TOC needs / Dispo plan: SNF Barriers to discharge / significant pending items: if remaining stable may be able to go to SNF in a few days      Family Communication: none at this time        Examination:  Physical Exam Constitutional:      General: He is not in acute distress.    Appearance: He is ill-appearing.  HENT:     Mouth/Throat:     Mouth: Mucous membranes are moist Cardiovascular:     Rate and  Rhythm: Normal rate and regular rhythm.     Heart sounds: Normal heart sounds.  Pulmonary:     Effort: Pulmonary effort is normal. No respiratory distress.     Breath sounds: Normal breath sounds.  Abdominal:     Palpations: Abdomen is soft.  Musculoskeletal:     Right lower leg: Right lower leg edema: trace.     Left lower leg: Left lower leg edema: trace.  Skin:    General: Skin is warm and dry.  Neurological:     General: No focal deficit present.     Mental Status: He is alert.               Physical Exam: Vitals:    02/26/23 1300 02/26/23 1400 02/26/23 1500 02/26/23 1600  BP: 105/75 110/71 (!) 102/53 110/66  Pulse: 98 90 100 97  Resp: (!) 27 (!) 23 (!) 23 (!) 27  Temp:  98 F (36.7 C)    TempSrc:  Oral    SpO2: 95% 98% 95% 95%  Weight:      Height:        Time spent: 40 minutes  Author: Verline Lema, MD 02/26/2023 6:02 PM  For on call review [email or web address].

## 2023-02-27 DIAGNOSIS — R579 Shock, unspecified: Secondary | ICD-10-CM | POA: Diagnosis not present

## 2023-02-27 LAB — CBC WITH DIFFERENTIAL/PLATELET
Abs Immature Granulocytes: 0.18 10*3/uL — ABNORMAL HIGH (ref 0.00–0.07)
Basophils Absolute: 0 10*3/uL (ref 0.0–0.1)
Basophils Relative: 0 %
Eosinophils Absolute: 0.1 10*3/uL (ref 0.0–0.5)
Eosinophils Relative: 1 %
HCT: 27 % — ABNORMAL LOW (ref 39.0–52.0)
Hemoglobin: 8.3 g/dL — ABNORMAL LOW (ref 13.0–17.0)
Immature Granulocytes: 2 %
Lymphocytes Relative: 8 %
Lymphs Abs: 0.9 10*3/uL (ref 0.7–4.0)
MCH: 30.5 pg (ref 26.0–34.0)
MCHC: 30.7 g/dL (ref 30.0–36.0)
MCV: 99.3 fL (ref 80.0–100.0)
Monocytes Absolute: 1 10*3/uL (ref 0.1–1.0)
Monocytes Relative: 9 %
Neutro Abs: 9.2 10*3/uL — ABNORMAL HIGH (ref 1.7–7.7)
Neutrophils Relative %: 80 %
Platelets: 128 10*3/uL — ABNORMAL LOW (ref 150–400)
RBC: 2.72 MIL/uL — ABNORMAL LOW (ref 4.22–5.81)
RDW: 18.1 % — ABNORMAL HIGH (ref 11.5–15.5)
WBC: 11.3 10*3/uL — ABNORMAL HIGH (ref 4.0–10.5)
nRBC: 0 % (ref 0.0–0.2)

## 2023-02-27 LAB — RENAL FUNCTION PANEL
Albumin: 2.4 g/dL — ABNORMAL LOW (ref 3.5–5.0)
Anion gap: 5 (ref 5–15)
BUN: 29 mg/dL — ABNORMAL HIGH (ref 8–23)
CO2: 30 mmol/L (ref 22–32)
Calcium: 8.6 mg/dL — ABNORMAL LOW (ref 8.9–10.3)
Chloride: 105 mmol/L (ref 98–111)
Creatinine, Ser: 1.92 mg/dL — ABNORMAL HIGH (ref 0.61–1.24)
GFR, Estimated: 35 mL/min — ABNORMAL LOW (ref >60)
Glucose, Bld: 115 mg/dL — ABNORMAL HIGH (ref 70–99)
Phosphorus: 2.5 mg/dL (ref 2.5–4.6)
Potassium: 4.7 mmol/L (ref 3.5–5.1)
Sodium: 140 mmol/L (ref 135–145)

## 2023-02-27 LAB — MAGNESIUM: Magnesium: 1.9 mg/dL (ref 1.7–2.4)

## 2023-02-27 MED ORDER — MORPHINE SULFATE (PF) 2 MG/ML IV SOLN
2.0000 mg | Freq: Once | INTRAVENOUS | Status: DC
  Filled 2023-02-27: qty 1

## 2023-02-27 NOTE — Progress Notes (Signed)
Progress Note   Patient: John [name] [id number] DOB: 04/05/1946 DOA: 01/29/2023     29 DOS: the patient was seen and examined on 02/27/2023    Objective: Patient was seen and examined at bedside this morning Continues to appear lethargic Physical therapist was at bedside working with patient Continues to require intranasal oxygen Denies chest pain nausea vomiting   Brief Narrative / Hospital Course:  77 year old male with a history of essential hypertension, on hydralazine and Cozaar, stage IV CKD but has not been on dialysis in the past, has chronic diastolic and systolic heart failure with cor pulmonale, moderate tricuspid regurgitation and aortic valve regurgitation, history of ascending aortic aneurysm, coronary artery disease with atherosclerosis, COPD and chronic hypoxemia baseline 2 L/min supplemental oxygen at home with recurrent episodes of hypoxemia and remote acute exacerbation of COPD with hypoxemia and hypercapnia 2 months ago history of ruptured left quadricep tendon 2 years ago, chronic thrombocytopenia and erythrocytosis recurrent lower extremity cellulitis, recurrent hyperkalemia, morbid obesity history of anasarca dyslipidemia, recent admission for acute CHF exacerbation requiring Lasix drip and BiPAP support, who was brought in by EMS due to worsening altered mental status and circulatory shock.     2/14: intubated, admitted to ICU for shock, CRRT 2/15- Echo w/ RV dysfunction and concern for PE. CTPE today.  He is weaned down on pressors  2/16- remains agitated intermittently overnight. Continued issues with clot burden through CRRT circuit. 2/17- remains agitated overnight. Requiring numerous ativan pushes. Attempting to wean down sedation. Failed SBT . 2/18- issues with CRRT overnight. Will attempt higher UF today. Assess for possible chest tube placement.  2/19- No issues with CRRT overnight, currently running without complication.  CT Head obtained overnight and  negative for acute process. Off Precedex this morning, somnolent. SBT as mental status permits. Stop Cefepime, checking ammonia. 2/20- Remains on CRRT. Pt more awake and tracking.Tolerated SBT for 12 hrs  2/21- Pt remains mechanically intubated on minimal vent settings.  Pt able to track and open eyes on command, however unable to move BLE 2/22- Off vasopressors.  Palliative Care consulted pt stating he DOES NOT want any form of dialysis.  If pt remains stable will transfer service to [facility] later changed his mind on dialysis.  2/26: Placed perm cath for HD 2/27: status post hemodialysis.  Doing reasonably well.  Mental status improved.  Is pretty clear with [place] now that he does not want hemodialysis. 2/28 -patient has gone back and forth about wanting dialysis and not wanting dialysis.  He told the palliative team this morning that he did not want any further dialysis: Back to room states that he wants everything done now.  Nephrology is going to hold dialysis today and tomorrow and evaluate for Friday to see if he needs it given that he had dialysis in the last 2 days.  The dialysis catheter was placed 2 days ago is working well and patient is healing recovering as expected per the vascular team and they are recommending outpatient follow-up. 2/29: Dialysis held today and yesterday.  Monitoring for renal recovery. Creatinine started to rise.  Nephrology starting Lasix 80 mg IV. 3/4 removal of permcath. He made decision for no dialysis and as such dialysis catheter was removed  3/5: stable awaiting SNF  3/6 large volume hematochezia, duodenal bleed on CTA, EGD done, extubated post-procedure and on pressor support, s/p 3 units PRBC. States he wants everything done to try to live, except CPR 3/7 Started on CRRT. Reiterates no CPR or repeat  intubation in event of respiratory distress but intubation for procedures is acceptable. OK w/ dialysis.  3/8 stable still on pressors and CRRT, potassium stable/WNL, more  alert and conversational today, full liquid diet, GI team s/o. Received another unit PRBC.  3/9: off CRRT d/t clotting episodes, off pressors. O2 support max 10L HFNC but down to 4L [place] in afternoon.  3/10-03/11: remains stable VS and labs, down to 2L [place]. Transfer out of stepdown.  03/12: Plt improving, renal fxn stable, remains on 2L [place], will move to MedSurg and order PT/OT assessment, anticipate medical stability over next few days.  3/13-dialysis catheter removed today as patient has been cleared to have no indication for ongoing dialysis.         Consultants:  Gastroenterology  Palliative Care  Cardiology Nephrology Vascular Surgery  ICU   Procedures: 02/14: intubation  CRRT 02/14-02/20 Emergent EGD 02/19/23 for duodenal bleed 01/29/23 Insertion of Non-tunneled Central Venous Catheter with US guidance  CRRT 03/07-03/09          ASSESSMENT & PLAN:   Principal Problem:   Shock circulatory ([place]) Active Problems:   Acute on chronic congestive heart failure ([place])   Acute hypoxic respiratory failure (HCC)   Toxic metabolic encephalopathy   Hypovolemic shock d/t ABLA - GI bleed Acute blood loss anemia on anemia of chronic disease  Trend CBC/HH and transfuse as needed  Protonix IV bid  Currently off vasopressor support No anticoags     Hyperkalemia - resolved Treating as needed and following BMP   Cardiogenic shock POA d/t HFpEF - improved but now hypovolemic shock d/t GI bleed and Acute decompensated HFpEF and right-sided heart failure RV dysfunction and dilation on echocardiography, concerning for RV failure and pulmonary hypertension. Porto-pulmonary hypertension on the differential given history of liver cirrhosis.  02/19/23 w/ GI bleed and hypovolemic shock he underwent aggressive fluid resuscitation and PRBC pending EGD, known risk w/ CHF and previous dialysis - monitor fluid status closely  CVVH for ultra-filtration for volume removal --> stopped  Cardiology had  previously signed off, pt appears euvolemic  Nephrology is on board we appreciate input   Acute Hypoxic Respiratory Failure d/t HFpEF and hypovolemic shock Continue supplemental O2 as needed to maintain SpO2 >92% NO REINTUBATION if respiratory distress    Toxic Metabolic Encephalopathy - improved but waxes and wanes I judge his decision-making capacity to be limited but not absent. He is improving daily.       Acute kidney injury on chronic kidney disease stage IV CRRT d/c Nephrology following  Dialysis catheter removed 02/26/2023   Hepatic cirrhosis Thrombocytopenia.   Was on cefepime empirically given leukocytosis.  Cultures remain negative. Finished 5 days of antibiotics.  Monitor CBC, CMP     DVT prophylaxis: SCD Pertinent IV fluids/nutrition: diet as tolerated    Code Status: DNR   Current Admission Status: inpatient, med tele  TOC needs / Dispo plan: SNF Barriers to discharge / significant pending items: if remaining stable may be able to go to SNF in a few days      Family Communication: none at this time        Examination:  Physical Exam Constitutional:      General: He is not in acute distress.    Appearance: He is ill-appearing.  HENT:     Mouth/Throat:     Mouth: Mucous membranes are moist Cardiovascular:     Rate and Rhythm: Normal rate and regular rhythm.     Heart sounds: Normal heart  sounds.  Pulmonary:     Effort: Pulmonary effort is normal. No respiratory distress.     Breath sounds: Normal breath sounds.  Abdominal:     Palpations: Abdomen is soft.  Musculoskeletal:     Right lower leg: Right lower leg edema: trace.     Left lower leg: Left lower leg edema: trace.  Skin:    General: Skin is warm and dry.  Neurological:     General: No focal deficit present.     Mental Status: He is alert.            Vitals:   02/27/23 1100 02/27/23 1200 02/27/23 1300 02/27/23 1612  BP: 104/74 126/72 110/65 111/72  Pulse: (!) 54  94 88  Resp:  (!) 23 (!) 21 (!) 24 (!) 22  Temp:  98 F (36.7 C)  98.4 F (36.9 C)  TempSrc:      SpO2: 95%  92% 98%  Weight:      Height:        Time spent: 38 minutes  Author: Verline Lema, MD 02/27/2023 5:50 PM  For on call review [email or web address].

## 2023-02-27 NOTE — Progress Notes (Signed)
Pt being up in the chair with Pt since 10:30 am, tolerated it well. Pt transferring to the floor room 127 and report called to Kayla B. RN

## 2023-02-27 NOTE — Evaluation (Signed)
Occupational Therapy Evaluation Patient Details Name: John [name] MRN: [id number] DOB: 04-01-1946 Today's Date: 02/27/2023   History of Present Illness Pt is a 77 y/o M admitted on 01/29/23 for hypovolemic shock 2/2 ABLA due to GI bleed.. Pt was intubated & CRRT was initiated. Pt found to have duodenal bleed & EGD done on 02/19/23. Pt with dialysis catheter removed on 02/26/23. PT has been re-consulted for PT re-evaluation. PMH: HTN, CKD Stage IV, chronic diastolic & systolic heart failure with cor pulmonale, moderate tricuspid regurgitation, AAA, CAD, COPD, chronic hypoxemia using 2L/min baseline O2, ruptured L quadriceps tendon 2 years ago, chronic thrombocytopenia, erythrocytosis, LE cellulitis, recurrent hyperkalemia, morbid obesity, anasarca, dyslipidema   Clinical Impression   Mr. Casteen was seen for OT re-evaluation this date. Session coordinated in conjunction with PT to maximize pt functional activity and safety. Pt recently transferred to CCU, but is now back on 4L HFNC and Pt presents to acute OT demonstrating impaired ADL performance and functional mobility 2/2 generalized weakness, decreased activity tolerance, impaired balance and functional independence. Pt currently requires MOD A +2 for STS t/fs, LB bathing/dressing, and MOD A +1 for brief functional transfers.  Pt would benefit from skilled OT services to address noted impairments and functional limitations (see below for any additional details) in order to maximize safety and independence while minimizing falls risk and caregiver burden. POC updated to reflect current pt needs/functional status. Upon hospital discharge, recommend STR to maximize pt safety and return to PLOF.        Recommendations for follow up therapy are one component of a multi-disciplinary discharge planning process, led by the attending physician.  Recommendations may be updated based on patient status, additional functional criteria and insurance  authorization.   Follow Up Recommendations  Skilled nursing-short term rehab (<3 hours/day)     Assistance Recommended at Discharge Frequent or constant Supervision/Assistance  Patient can return home with the following Two people to help with walking and/or transfers;Two people to help with bathing/dressing/bathroom;Help with stairs or ramp for entrance;Assist for transportation;Assistance with cooking/housework    Functional Status Assessment  Patient has had a recent decline in their functional status and demonstrates the ability to make significant improvements in function in a reasonable and predictable amount of time.  Equipment Recommendations       Recommendations for Other Services       Precautions / Restrictions Precautions Precautions: Fall Restrictions Weight Bearing Restrictions: No      Mobility Bed Mobility Overal bed mobility: Needs Assistance Bed Mobility: Supine to Sit Rolling: Min assist Sidelying to sit: Supervision, HOB elevated   Sit to supine: Mod assist, Max assist, HOB elevated Sit to sidelying: +2 for physical assistance, Max assist      Transfers Overall transfer level: Needs assistance Equipment used: Rolling walker (2 wheels) Transfers: Sit to/from Stand, Bed to chair/wheelchair/BSC Sit to Stand: Min assist, Mod assist (min assist +2 from elevated EOB, mod assist +2 from low recliner with cuing re: hand placement & assistance to transition BUE to RW once standing)     Step pivot transfers: Min assist, Mod assist, +2 physical assistance     General transfer comment: heavy cuing for U&LE set up and sequencing - assist to rise and keep weight shifting forward      Balance Overall balance assessment: Needs assistance Sitting-balance support: Feet unsupported, No upper extremity supported Sitting balance-Leahy Scale: Fair Sitting balance - Comments: supervision static sitting   Standing balance support: Bilateral upper extremity  supported, Reliant on  assistive device for balance, During functional activity Standing balance-Leahy Scale: Poor Standing balance comment: L lateral lean in standing/gait                           ADL either performed or assessed with clinical judgement   ADL Overall ADL's : Needs assistance/impaired Eating/Feeding: Set up;Sitting   Grooming: Wash/dry face;Sitting;Set up;Supervision/safety               Lower Body Dressing: Maximal assistance Lower Body Dressing Details (indicate cue type and reason): MAX A to don bilat hospital socks at bed level. Toilet Transfer: Moderate assistance;+2 for safety/equipment;Stand-pivot;BSC/3in1   Toileting- Clothing Manipulation and Hygiene: Moderate assistance;Sit to/from stand       Functional mobility during ADLs: +2 for safety/equipment;Moderate assistance;Rolling walker (2 wheels)       Vision Patient Visual Report: No change from baseline       Perception     Praxis      Pertinent Vitals/Pain Pain Assessment Pain Assessment: No/denies pain     Hand Dominance Right   Extremity/Trunk Assessment Upper Extremity Assessment Upper Extremity Assessment: Generalized weakness   Lower Extremity Assessment Lower Extremity Assessment: Generalized weakness       Communication Communication Communication: Other (comment) (speaks softly)   Cognition Arousal/Alertness: Awake/alert Behavior During Therapy: Flat affect, WFL for tasks assessed/performed Overall Cognitive Status: Within Functional Limits for tasks assessed                                 General Comments: Polite, willing to participate & do "anything" it takes to get him out of here. Follows simple commands with extra time during session.     General Comments  Pt on 4L/min via nasal cannula, max HR 128 bpm.    Exercises Other Exercises Other Exercises: OT facilitated ADL management as described above with ongoing education re: safety,  falls prevention, safe use of AE/DME For ADL management, and energy conservation.   Shoulder Instructions      Home Living Family/patient expects to be discharged to:: Private residence Living Arrangements: Spouse/significant other Available Help at Discharge: Family;Available 24 hours/day Type of Home: House Home Access: Stairs to enter [employer] of Steps: 3 Entrance Stairs-Rails:  (a rail of some time (info taken from previous chart entry & not specified there)) Home Layout: One level     Bathroom Shower/Tub: Teacher, early years/pre: Standard     Home Equipment: Cane - quad;Standard Environmental consultant   Additional Comments: Pt lives with [name], long time GF who is retired and available 24/7 per patient      Prior Functioning/Environment Prior Level of Function : Independent/Modified Independent;History of Falls (last six months)             Mobility Comments: Ind amb without an AD limited community distances, one fall in the last 6 months ADLs Comments: Pt reports Ind with self care and shared IADL tasks        OT Problem List: Decreased strength;Decreased activity tolerance;Impaired balance (sitting and/or standing);Decreased knowledge of use of DME or AE;Decreased safety awareness;Decreased cognition;Decreased coordination;Decreased range of motion      OT Treatment/Interventions: Self-care/ADL training;Therapeutic exercise;Therapeutic activities;Energy conservation;DME and/or AE instruction;Patient/family education;Balance training    OT Goals(Current goals can be found in the care plan section) Acute Rehab OT Goals Patient Stated Goal: to increase independence OT Goal Formulation: With patient/family Time  For Goal Achievement: 03/13/23 Potential to Achieve Goals: Good ADL Goals Pt Will Perform Toileting - Clothing Manipulation and hygiene: with adaptive equipment;sit to/from stand;with min assist  OT Frequency: Min 2X/week    Co-evaluation  PT/OT/SLP Co-Evaluation/Treatment: Yes Reason for Co-Treatment: Complexity of the patient's impairments (multi-system involvement);For patient/therapist safety;Necessary to address cognition/behavior during functional activity;To address functional/ADL transfers PT goals addressed during session: Mobility/safety with mobility;Balance;Proper use of DME OT goals addressed during session: Strengthening/ROM;Other (comment)      AM-PAC OT "6 Clicks" Daily Activity     Outcome Measure Help from another person eating meals?: A Little Help from another person taking care of personal grooming?: A Little Help from another person toileting, which includes using toliet, bedpan, or urinal?: A Lot Help from another person bathing (including washing, rinsing, drying)?: A Lot Help from another person to put on and taking off regular upper body clothing?: A Little Help from another person to put on and taking off regular lower body clothing?: A Lot 6 Click Score: 15   End of Session Equipment Utilized During Treatment: Gait belt;Rolling walker (2 wheels) Nurse Communication: Mobility status  Activity Tolerance: Patient tolerated treatment well Patient left: in chair;with call bell/phone within reach  OT Visit Diagnosis: Unsteadiness on feet (R26.81);Repeated falls (R29.6);Muscle weakness (generalized) (M62.81)                Time: [id number] OT Time Calculation (min): 32 min Charges:  OT General Charges $OT Visit: 1 Visit OT Evaluation $OT Re-eval: 1 Re-eval OT Treatments $Self Care/Home Management : 8-22 mins  Shara Blazing, M.S., OTR/L 02/27/23, 1:31 PM

## 2023-02-27 NOTE — Progress Notes (Signed)
Nutrition Follow Up Note   DOCUMENTATION CODES:   Severe malnutrition in context of acute illness/injury  INTERVENTION:   Ensure Enlive po TID, each supplement provides 350 kcal and 20 grams of protein.  Magic cup TID with meals, each supplement provides 290 kcal and 9 grams of protein  Rena-vit po daily   Vitamin C '500mg'$  po BID   Liberalize diet   Pt at high refeed risk; recommend monitor potassium, magnesium and phosphorus labs daily until stable  Daily weights   NUTRITION DIAGNOSIS:   Severe Malnutrition related to acute illness as evidenced by moderate fat depletion, severe fat depletion, moderate muscle depletion. -ongoing   GOAL:   Patient will meet greater than or equal to 90% of their needs -progressing   MONITOR:   PO intake, Supplement acceptance, Diet advancement, Labs, Weight trends, I & O's, Skin  ASSESSMENT:   77 y/o male with h/o COPD, CHF, HTN, CKD IV, aortic aneurysm and HLD who is admitted with circulatory shock, CHF, encephalopaty and new ESRD. Pt with cirrrhosis noted on CT scan.  -Pt s/p EGD 3/6; pt found to have multiple non-bleeding ulcers.   Met with pt in room today. Pt reports that his appetite and oral intake is improving and that he is drinking the Ensure. Pt is documented to be eating 25-80% of meals. Pt ate 80% of his breakfast this morning. RN reports that patient will sometimes drink Ensure and sometimes refuses. Pt reports that he loves the Ensure (chocolate or strawberry) and prefers it cold. Pt is able to feed himself more now. Pt is no longer requiring dialysis; HD cath was removed yesterday. Electrolytes wnl; RD will liberalize pt's diet. Recommend continue vitamins and supplements after discharge.   It is difficult to determine if patient has had any significant weight changes as pt with CHF and edema. Per chart, pt appears to be down ~13lbs from his UBW. NFPE is similar to last week with some increased muscle depletions noted.    Medications reviewed and include: vitamin C, rena-vit, protonix  Labs reviewed: K 4.7 wnl, BUN 29(H), creat 1.92(H), P 2.5 wnl, Mg 1.9 wnl Wbc- 11.3(H), Hgb 8.3(L), Hct 27.0(L)  Nutrition Focused Physical Exam:  Flowsheet Row Most Recent Value  Orbital Region No depletion  Upper Arm Region Severe depletion  Thoracic and Lumbar Region Moderate depletion  Buccal Region No depletion  Temple Region Moderate depletion  Clavicle Bone Region Moderate depletion  Clavicle and Acromion Bone Region Moderate depletion  Scapular Bone Region Moderate depletion  Dorsal Hand Mild depletion  Patellar Region Severe depletion  Anterior Thigh Region Severe depletion  Posterior Calf Region Severe depletion  Edema (RD Assessment) Mild  Hair Reviewed  Eyes Reviewed  Mouth Reviewed  Skin Reviewed  Nails Reviewed   Diet Order:   Diet Order             Diet 2 gram sodium Room service appropriate? Yes; Fluid consistency: Thin; Fluid restriction: 1500 mL Fluid  Diet effective now                  EDUCATION NEEDS:   Education needs have been addressed  Skin:  Skin Assessment: Reviewed RN Assessment (Partial thickness wounds LE and right upper back; LE related to edema)  Last BM:  3/13- TYPE 6  Height:   Ht Readings from Last 1 Encounters:  01/29/23 '5\' 11"'$  (1.803 m)    Weight:   Wt Readings from Last 1 Encounters:  02/27/23 84.2 kg  Ideal Body Weight:  78 kg  BMI:  Body mass index is 25.89 kg/m.  Estimated Nutritional Needs:   Kcal:  1800-2100kcal/day  Protein:  90-105g/day  Fluid:  1000 ml +UOP  Koleen Distance MS, RD, LDN Please refer to [facility] for RD and/or RD on-call/weekend/after hours pager

## 2023-02-27 NOTE — Progress Notes (Signed)
Physical Therapy Re-evaluation Patient Details Name: John [name] MRN: [id number] DOB: [birth year] Today's Date: 02/27/2023  History of Present Illness  Pt is a 77 y/o M admitted on 01/29/23 for hypovolemic shock 2/2 ABLA due to GI bleed.. Pt was intubated & CRRT was initiated. Pt found to have duodenal bleed & EGD done on 02/19/23. Pt with dialysis catheter removed on 02/26/23. PT has been re-consulted for PT re-evaluation. PMH: HTN, CKD Stage IV, chronic diastolic & systolic heart failure with cor pulmonale, moderate tricuspid regurgitation, AAA, CAD, COPD, chronic hypoxemia using 2L/min baseline O2, ruptured L quadriceps tendon 2 years ago, chronic thrombocytopenia, erythrocytosis, LE cellulitis, recurrent hyperkalemia, morbid obesity, anasarca, dyslipidema  Clinical Impression  Pt seen for PT re-evaluation with co-tx with OT. Pt reports prior to admission he intermittently used a RW. On this date, pt is able to complete supine>sit with HOB fully elevated & extra time. Pt requires min increasing to mod assist +2 for STS from EOB & stand pivot to recliner. Pt ambulates ~3 ft with RW & mod assist +2 with chair follow for safety. Pt requires seated rest break between activities 2/2 fatigue. Recommend STR upon d/c from acute setting.       Recommendations for follow up therapy are one component of a multi-disciplinary discharge planning process, led by the attending physician.  Recommendations may be updated based on patient status, additional functional criteria and insurance authorization.  Follow Up Recommendations Skilled nursing-short term rehab (<3 hours/day) Can patient physically be transported by private vehicle: No    Assistance Recommended at Discharge Frequent or constant Supervision/Assistance  Patient can return home with the following  Two people to help with walking and/or transfers;Two people to help with bathing/dressing/bathroom;Direct supervision/assist for medications  management;Help with stairs or ramp for entrance;Assistance with feeding;Assist for transportation;Assistance with cooking/housework;Direct supervision/assist for financial management    Equipment Recommendations None recommended by PT (TBD in next venue)  Recommendations for Other Services       Functional Status Assessment Patient has had a recent decline in their functional status and demonstrates the ability to make significant improvements in function in a reasonable and predictable amount of time.     Precautions / Restrictions Precautions Precautions: Fall Restrictions Weight Bearing Restrictions: No      Mobility  Bed Mobility Overal bed mobility: Needs Assistance Bed Mobility: Supine to Sit   Sidelying to sit: Supervision, HOB elevated (HOB fully elevated, pt able to come to sitting EOB with extra time, cuing)            Transfers   Equipment used: Rolling walker (2 wheels) Transfers: Sit to/from Stand, Bed to chair/wheelchair/BSC Sit to Stand: Min assist, Mod assist (min assist +2 from elevated EOB, mod assist +2 from low recliner with cuing re: hand placement & assistance to transition BUE to RW once standing)   Step pivot transfers: Min assist, Mod assist, +2 physical assistance            Ambulation/Gait Ambulation/Gait assistance: Mod assist, +2 safety/equipment (chair follow for safety) Gait Distance (Feet): 3 Feet Assistive device: Rolling walker (2 wheels) Gait Pattern/deviations: Decreased step length - right, Decreased dorsiflexion - left, Decreased step length - left, Decreased stride length, Decreased dorsiflexion - right, Decreased stance time - left, Decreased weight shift to left Gait velocity: decreased     General Gait Details: Narrow step width LLE, L lateral lean, chair follow for safety.  Stairs            Emergency planning/management officer  Modified Rankin (Stroke Patients Only)       Balance Overall balance assessment: Needs  assistance Sitting-balance support: Feet unsupported, No upper extremity supported Sitting balance-Leahy Scale: Fair Sitting balance - Comments: supervision static sitting   Standing balance support: Bilateral upper extremity supported, Reliant on assistive device for balance, During functional activity Standing balance-Leahy Scale: Poor Standing balance comment: L lateral lean in standing/gait                             Pertinent Vitals/Pain Pain Assessment Pain Assessment: No/denies pain    Home Living Family/patient expects to be discharged to:: Private residence Living Arrangements: Spouse/significant other Available Help at Discharge: Family;Available 24 hours/day Type of Home: House Home Access: Stairs to enter Entrance Stairs-Rails:  (a rail of some time (info taken from previous chart entry & not specified there)) Entrance Stairs-Number of Steps: 3   Home Layout: One level Home Equipment: Cane - quad;Standard Environmental consultant Additional Comments: Pt lives with [name], long time GF who is retired and available 24/7 per patient    Prior Function Prior Level of Function : Independent/Modified Independent;History of Falls (last six months)             Mobility Comments: Ind amb without an AD limited community distances, one fall in the last 6 months ADLs Comments: Pt reports Ind with self care and shared IADL tasks     Hand Dominance        Extremity/Trunk Assessment   Upper Extremity Assessment Upper Extremity Assessment: Generalized weakness    Lower Extremity Assessment Lower Extremity Assessment: Generalized weakness       Communication   Communication: Other (comment) (speaks softly)  Cognition Arousal/Alertness: Awake/alert Behavior During Therapy: Flat affect, WFL for tasks assessed/performed Overall Cognitive Status: Within Functional Limits for tasks assessed                                 General Comments: Polite, willing to  participate & do "anything" it takes to get him out of here. Follows simple commands with extra time during session.        General Comments General comments (skin integrity, edema, etc.): Pt on 4L/min via nasal cannula, max HR 128 bpm.    Exercises     Assessment/Plan    PT Assessment Patient needs continued PT services  PT Problem List Decreased strength;Cardiopulmonary status limiting activity;Decreased range of motion;Decreased activity tolerance;Decreased balance;Decreased knowledge of precautions;Decreased mobility;Decreased safety awareness;Decreased knowledge of use of DME;Decreased skin integrity;Decreased cognition       PT Treatment Interventions Therapeutic exercise;Balance training;DME instruction;Gait training;Stair training;Neuromuscular re-education;Functional mobility training;Cognitive remediation;Therapeutic activities;Patient/family education;Modalities;Manual techniques    PT Goals (Current goals can be found in the Care Plan section)  Acute Rehab PT Goals Patient Stated Goal: to go home PT Goal Formulation: With patient Time For Goal Achievement: 03/13/23 Potential to Achieve Goals: Fair    Frequency Min 2X/week     Co-evaluation PT/OT/SLP Co-Evaluation/Treatment: Yes Reason for Co-Treatment: Complexity of the patient's impairments (multi-system involvement);For patient/therapist safety;Necessary to address cognition/behavior during functional activity;To address functional/ADL transfers PT goals addressed during session: Mobility/safety with mobility;Balance;Proper use of DME         AM-PAC PT "6 Clicks" Mobility  Outcome Measure Help needed turning from your back to your side while in a flat bed without using bedrails?: A Little Help needed moving from lying on your  back to sitting on the side of a flat bed without using bedrails?: A Lot Help needed moving to and from a bed to a chair (including a wheelchair)?: A Lot Help needed standing up from a  chair using your arms (e.g., wheelchair or bedside chair)?: A Lot Help needed to walk in hospital room?: A Lot Help needed climbing 3-5 steps with a railing? : Total 6 Click Score: 12    End of Session Equipment Utilized During Treatment: Oxygen Activity Tolerance: Patient tolerated treatment well Patient left: in chair (in care of OT) Nurse Communication: Mobility status PT Visit Diagnosis: Muscle weakness (generalized) (M62.81);Other abnormalities of gait and mobility (R26.89);Difficulty in walking, not elsewhere classified (R26.2);Unsteadiness on feet (R26.81)    Time: [id number] PT Time Calculation (min) (ACUTE ONLY): 25 min   Charges:   PT Evaluation $PT Re-evaluation: 1 Re-eval PT Treatments $Therapeutic Activity: 8-22 mins        Lavone Nian, PT, DPT 02/27/23, 10:50 AM   Waunita Schooner 02/27/2023, 10:46 AM

## 2023-02-27 NOTE — Progress Notes (Signed)
[name]  PROGRESS NOTE   Subjective:   Patient sitting up in bed Currently awaiting breakfast Remains on 2 L nasal cannula Trace lower extremity edema  Creatinine 1.92 Urine output 1.45 L.  Objective:  Vital signs: Blood pressure 104/74, pulse (!) 54, temperature 98 F (36.7 C), temperature source Oral, resp. rate (!) 23, height '5\' 11"'$  (1.803 m), weight 84.2 kg, SpO2 (!) 72 %.  Intake/Output Summary (Last 24 hours) at 02/27/2023 1147 Last data filed at 02/27/2023 1047 Gross per 24 hour  Intake 360 ml  Output 950 ml  Net -590 ml    Filed Weights   02/25/23 0500 02/26/23 0500 02/27/23 0407  Weight: 84.4 kg 80.7 kg 84.2 kg     Physical Exam: General:  No acute distress  Head:  Normocephalic, atraumatic. Moist oral mucosal membranes  Eyes:  Anicteric  Lungs:   Clear to auscultation, normal effort  Heart:  S1S2 no rubs  Abdomen:   Soft, nontender, bowel sounds present  Extremities:  No peripheral edema.  Neurologic:  Awake, alert, following commands  Skin:  No lesions  Access: None    Basic Metabolic Panel: Recent Labs  Lab 02/23/23 0400 02/23/23 0407 02/24/23 0450 02/25/23 0530 02/26/23 0607 02/27/23 0427  NA 135  --  134*  135 135 138 140  K 3.8  --  3.8  3.8 4.0 4.4 4.7  CL 102  --  102  103 101 106 105  CO2 29  --  '26  28 28 28 30  '$ GLUCOSE 115*  --  119*  120* 125* 137* 115*  BUN 26*  --  27*  28* 28* 29* 29*  CREATININE 1.82*  --  2.05*  2.06* 2.11* 2.12* 1.92*  CALCIUM 8.0*  --  8.3*  8.4* 8.3* 8.5* 8.6*  MG  --  1.9 2.0 1.9 1.8 1.9  PHOS 2.8  --  2.7 2.5 2.2* 2.5    GFR: Estimated Creatinine Clearance: 34.3 mL/min (A) (by C-G formula based on SCr of 1.92 mg/dL (H)).  Liver Function Tests: Recent Labs  Lab 02/23/23 0400 02/24/23 0450 02/25/23 0530 02/26/23 0607 02/27/23 0427  ALBUMIN 2.1* 2.2* 2.3* 2.4* 2.4*    No results for input(s): "LIPASE", "AMYLASE" in the last 168 hours. No results for input(s): "AMMONIA"  in the last 168 hours.  CBC: Recent Labs  Lab 02/21/23 2305 02/22/23 0600 02/23/23 0407 02/25/23 0530 02/27/23 0427  WBC  --   --  12.1* 11.0* 11.3*  NEUTROABS  --   --   --   --  9.2*  HGB 6.9* 7.6* 7.5* 8.0* 8.3*  HCT 22.0* 24.5* 24.4* 25.7* 27.0*  MCV  --   --  96.1 96.6 99.3  PLT  --   --  66* 103* 128*      HbA1C: Hgb A1c MFr Bld  Date/Time Value Ref Range Status  10/02/2022 11:16 PM 6.3 (H) 4.8 - 5.6 % Final    Comment:    (NOTE) Pre diabetes:          5.7%-6.4%  Diabetes:              >6.4%  Glycemic control for   <7.0% adults with diabetes     Urinalysis: No results for input(s): "COLORURINE", "LABSPEC", "PHURINE", "GLUCOSEU", "HGBUR", "BILIRUBINUR", "KETONESUR", "PROTEINUR", "UROBILINOGEN", "NITRITE", "LEUKOCYTESUR" in the last 72 hours.  Invalid input(s): "APPERANCEUR"    Imaging: No results found.   Medications:    sodium chloride Stopped (02/21/23 0330)  vitamin C  500 mg Oral BID   Chlorhexidine Gluconate Cloth  6 each Topical Daily   feeding supplement  237 mL Oral TID BM    morphine injection  2 mg Intravenous Once   multivitamin  1 tablet Oral QHS   pantoprazole  40 mg Oral BID    Assessment/ Plan:     John [name] is a 77 y.o. black male with chronic diastolic congestive heart failure, hypertension, COPD, hyperlipidemia, who is admitted to [facility] on 01/29/2023 for Peripheral edema [R60.9] Shock circulatory ([place]) [R57.9] Acute on chronic congestive heart failure, unspecified heart failure type ([place]) [I50.9] Acute renal failure superimposed on chronic kidney disease, unspecified acute renal failure type, unspecified CKD stage ([place]) [N17.9, N18.9]   #1: Acute kidney injury with chronic kidney disease stage IV. Acute kidney injury most likely secondary to cardiorenal syndrome  Developed hyperkalemia due to GI bleed and hypovolemic shock, was placed on CRRT on Saturday. CRRT terminated due to multiple clotting events.   Creatinine  currently improved beyond base.  Patient continues to maintain good urine output.   #2 Acute respiratory failure requiring intubation: Extubated on 02/05/23.   Remains on 2L [place]   #3: Hypotension: Patient had cardiogenic shock requiring pressors.  No pressors. Blood pressure acceptable for this patient  #4: Anemia with chronic kidney disease.  Hemoglobin continues to slowly improve.    LOS: [facility] 3/14/202411:47 AM

## 2023-02-27 NOTE — TOC Progression Note (Addendum)
Transition of Care [facility]) - Progression Note    Patient Details  Name: John [name] MRN: [id number] Date of Birth: [birth year]  Transition of Care [facility]) CM/SW [place],  Phone Number: 02/27/2023, 1:06 PM  Clinical Narrative:     Per chart review floor TOC had started SNF process for [facility]. Patient was medically stable pending placement in a skilled nursing facility when he developed profound GI bleeding required short ICU stay, is more stable now off CRRT and pressors,  and was transferred to [facility].   Potential for continuation of new HD. Per chart review patient was back and forth about HD, but in most recent conversations with MD/HD he is okay with HD if needed. CSW has reached out to HD coordinator for confirmation of HD needs at discharge to assist in placement arrangement. Update: Per HD Coordinator patient will not need ongoing HD at SNF as he had refused.   TOC will follow for discharge planning needs.   Expected Discharge Plan:  (TBD) Barriers to Discharge: Continued Medical Work up  Expected Discharge Plan and Services   Discharge Planning Services: CM Consult   Living arrangements for the past 2 months: Single Family Home                 DME Arranged: N/A                     Social Determinants of Health (SDOH) Interventions SDOH Screenings   Food Insecurity: No Food Insecurity (01/29/2023)  Housing: Low Risk  (01/29/2023)  Transportation Needs: No Transportation Needs (01/29/2023)  Utilities: Not At Risk (01/29/2023)  Alcohol Screen: Low Risk  (07/17/2022)  Depression (PHQ2-9): Low Risk  (07/30/2022)  Financial Resource Strain: Low Risk  (07/17/2022)  Physical Activity: Sufficiently Active (07/17/2022)  Social Connections: Socially Integrated (07/17/2022)  Stress: No Stress Concern Present (07/17/2022)  Tobacco Use: Medium Risk (02/20/2023)    Readmission Risk Interventions    01/30/2023   11:09 AM  Readmission Risk Prevention Plan   Transportation Screening Complete  Medication Review ([place]) Complete  PCP or Specialist appointment within 3-5 days of discharge Complete  HRI or [place] Complete  SW Recovery Care/Counseling Consult Complete  Palliative Care Screening Complete  [place] Not Applicable

## 2023-02-28 DIAGNOSIS — R579 Shock, unspecified: Secondary | ICD-10-CM | POA: Diagnosis not present

## 2023-02-28 LAB — BASIC METABOLIC PANEL
Anion gap: 9 (ref 5–15)
BUN: 29 mg/dL — ABNORMAL HIGH (ref 8–23)
CO2: 31 mmol/L (ref 22–32)
Calcium: 9.3 mg/dL (ref 8.9–10.3)
Chloride: 102 mmol/L (ref 98–111)
Creatinine, Ser: 1.96 mg/dL — ABNORMAL HIGH (ref 0.61–1.24)
GFR, Estimated: 35 mL/min — ABNORMAL LOW (ref >60)
Glucose, Bld: 112 mg/dL — ABNORMAL HIGH (ref 70–99)
Potassium: 5.3 mmol/L — ABNORMAL HIGH (ref 3.5–5.1)
Sodium: 142 mmol/L (ref 135–145)

## 2023-02-28 LAB — CBC WITH DIFFERENTIAL/PLATELET
Abs Immature Granulocytes: 0.25 10*3/uL — ABNORMAL HIGH (ref 0.00–0.07)
Basophils Absolute: 0 10*3/uL (ref 0.0–0.1)
Basophils Relative: 0 %
Eosinophils Absolute: 0.1 10*3/uL (ref 0.0–0.5)
Eosinophils Relative: 1 %
HCT: 30.5 % — ABNORMAL LOW (ref 39.0–52.0)
Hemoglobin: 9.1 g/dL — ABNORMAL LOW (ref 13.0–17.0)
Immature Granulocytes: 2 %
Lymphocytes Relative: 7 %
Lymphs Abs: 0.8 10*3/uL (ref 0.7–4.0)
MCH: 29.8 pg (ref 26.0–34.0)
MCHC: 29.8 g/dL — ABNORMAL LOW (ref 30.0–36.0)
MCV: 100 fL (ref 80.0–100.0)
Monocytes Absolute: 0.8 10*3/uL (ref 0.1–1.0)
Monocytes Relative: 8 %
Neutro Abs: 8.7 10*3/uL — ABNORMAL HIGH (ref 1.7–7.7)
Neutrophils Relative %: 82 %
Platelets: 142 10*3/uL — ABNORMAL LOW (ref 150–400)
RBC: 3.05 MIL/uL — ABNORMAL LOW (ref 4.22–5.81)
RDW: 18.4 % — ABNORMAL HIGH (ref 11.5–15.5)
WBC: 10.7 10*3/uL — ABNORMAL HIGH (ref 4.0–10.5)
nRBC: 0 % (ref 0.0–0.2)

## 2023-02-28 LAB — RENAL FUNCTION PANEL
Albumin: 2.5 g/dL — ABNORMAL LOW (ref 3.5–5.0)
Anion gap: 9 (ref 5–15)
BUN: 29 mg/dL — ABNORMAL HIGH (ref 8–23)
CO2: 29 mmol/L (ref 22–32)
Calcium: 9.2 mg/dL (ref 8.9–10.3)
Chloride: 103 mmol/L (ref 98–111)
Creatinine, Ser: 1.92 mg/dL — ABNORMAL HIGH (ref 0.61–1.24)
GFR, Estimated: 35 mL/min — ABNORMAL LOW (ref >60)
Glucose, Bld: 108 mg/dL — ABNORMAL HIGH (ref 70–99)
Phosphorus: 3 mg/dL (ref 2.5–4.6)
Potassium: 5.3 mmol/L — ABNORMAL HIGH (ref 3.5–5.1)
Sodium: 141 mmol/L (ref 135–145)

## 2023-02-28 LAB — GLUCOSE, CAPILLARY
Glucose-Capillary: 110 mg/dL — ABNORMAL HIGH (ref 70–99)
Glucose-Capillary: 113 mg/dL — ABNORMAL HIGH (ref 70–99)

## 2023-02-28 LAB — MAGNESIUM: Magnesium: 2 mg/dL (ref 1.7–2.4)

## 2023-02-28 MED ORDER — DEXTROSE 50 % IV SOLN
25.0000 g | Freq: Once | INTRAVENOUS | Status: AC
  Administered 2023-02-28: 25 g via INTRAVENOUS
  Filled 2023-02-28: qty 50

## 2023-02-28 MED ORDER — INSULIN ASPART 100 UNIT/ML IV SOLN
10.0000 [IU] | Freq: Once | INTRAVENOUS | Status: AC
  Administered 2023-02-28: 10 [IU] via INTRAVENOUS
  Filled 2023-02-28: qty 0.1

## 2023-02-28 MED ORDER — CALCIUM GLUCONATE-NACL 1-0.675 GM/50ML-% IV SOLN
1.0000 g | Freq: Once | INTRAVENOUS | Status: AC
  Administered 2023-02-28: 1000 mg via INTRAVENOUS
  Filled 2023-02-28: qty 50

## 2023-02-28 NOTE — Progress Notes (Signed)
Physical Therapy Treatment Patient Details Name: John [name] MRN: [id number] DOB: [birth year] Today's Date: 02/28/2023   History of Present Illness Pt is a 77 y/o M admitted on 01/29/23 for hypovolemic shock 2/2 ABLA due to GI bleed.. Pt was intubated & CRRT was initiated. Pt found to have duodenal bleed & EGD done on 02/19/23. Pt with dialysis catheter removed on 02/26/23. PT has been re-consulted for PT re-evaluation. PMH: HTN, CKD Stage IV, chronic diastolic & systolic heart failure with cor pulmonale, moderate tricuspid regurgitation, AAA, CAD, COPD, chronic hypoxemia using 2L/min baseline O2, ruptured L quadriceps tendon 2 years ago, chronic thrombocytopenia, erythrocytosis, LE cellulitis, recurrent hyperkalemia, morbid obesity, anasarca, dyslipidema    PT Comments    Pt received in bed, family at bedside, denies pain, however very low energy and weak. ModA to transfer to EOB with HOB raised. Fair sitting balance, several sit to stand transfers from raised bed with static standing at RW for hygiene assistance. Pt took small steps with increased time and assist to maneuver RW to bedside chair. ModA to lower to sitting. Pt encouraged to sit upright to promote pulmonary toileting after coughing up grey yellowish sputum upon sitting up. Pt left up in chair with all needs met. Continue to recommend SNF once medically cleared.   Recommendations for follow up therapy are one component of a multi-disciplinary discharge planning process, led by the attending physician.  Recommendations may be updated based on patient status, additional functional criteria and insurance authorization.  Follow Up Recommendations  Skilled nursing-short term rehab (<3 hours/day) Can patient physically be transported by private vehicle: No   Assistance Recommended at Discharge Frequent or constant Supervision/Assistance  Patient can return home with the following Two people to help with walking and/or transfers;Two people  to help with bathing/dressing/bathroom;Direct supervision/assist for medications management;Help with stairs or ramp for entrance;Assistance with feeding;Assist for transportation;Assistance with cooking/housework;Direct supervision/assist for financial management   Equipment Recommendations  None recommended by PT    Recommendations for Other Services       Precautions / Restrictions Precautions Precautions: Fall Restrictions Weight Bearing Restrictions: No     Mobility  Bed Mobility Overal bed mobility: Needs Assistance Bed Mobility: Supine to Sit Rolling: Min assist Sidelying to sit: HOB elevated, Min assist Supine to sit: Mod assist, HOB elevated     General bed mobility comments:  (Pt very weak requiring increased assist to complete)    Transfers Overall transfer level: Needs assistance Equipment used: Rolling walker (2 wheels) Transfers: Sit to/from Stand, Bed to chair/wheelchair/BSC Sit to Stand: Max assist, From elevated surface   Step pivot transfers: Min assist       General transfer comment:  (Heavy cuing for safety and safe maneuvering to bedside chair)    Ambulation/Gait   Gait Distance (Feet):  (Short steps to recliner with RW, no true gait distance)               Stairs             Wheelchair Mobility    Modified Rankin (Stroke Patients Only)       Balance Overall balance assessment: Needs assistance Sitting-balance support: Feet unsupported, No upper extremity supported Sitting balance-Leahy Scale: Fair Sitting balance - Comments: supervision static sitting   Standing balance support: Bilateral upper extremity supported, Reliant on assistive device for balance, During functional activity Standing balance-Leahy Scale: Poor Standing balance comment: Pt able to statically stand for one minute increments for hygiene  Cognition Arousal/Alertness: Awake/alert Behavior During Therapy: Flat  affect, WFL for tasks assessed/performed Overall Cognitive Status: Within Functional Limits for tasks assessed                                 General Comments: Polite, willing to participate & do "anything" it takes to get him out of here. Follows simple commands with extra time during session.        Exercises      General Comments General comments (skin integrity, edema, etc.): Pt incontinent of small amount of stool. Very weak and only able to tolerate turning to bedside chair      Pertinent Vitals/Pain Pain Assessment Pain Assessment: No/denies pain    Home Living                          Prior Function            PT Goals (current goals can now be found in the care plan section) Acute Rehab PT Goals Patient Stated Goal: to go home    Frequency    Min 2X/week      PT Plan Current plan remains appropriate    Co-evaluation              AM-PAC PT "6 Clicks" Mobility   Outcome Measure  Help needed turning from your back to your side while in a flat bed without using bedrails?: A Little Help needed moving from lying on your back to sitting on the side of a flat bed without using bedrails?: A Lot Help needed moving to and from a bed to a chair (including a wheelchair)?: A Lot Help needed standing up from a chair using your arms (e.g., wheelchair or bedside chair)?: A Lot Help needed to walk in hospital room?: A Lot Help needed climbing 3-5 steps with a railing? : Total 6 Click Score: 12    End of Session Equipment Utilized During Treatment: Oxygen;Gait belt Activity Tolerance: Patient limited by fatigue Patient left: in chair;with call bell/phone within reach Nurse Communication: Mobility status PT Visit Diagnosis: Muscle weakness (generalized) (M62.81);Other abnormalities of gait and mobility (R26.89);Difficulty in walking, not elsewhere classified (R26.2);Unsteadiness on feet (R26.81)     Time: [id number] PT Time Calculation  (min) (ACUTE ONLY): 25 min  Charges:  $Gait Training: 8-22 mins $Therapeutic Activity: 8-22 mins                    Mikel Cella, PTA   John [name] 02/28/2023, 3:26 PM

## 2023-02-28 NOTE — Progress Notes (Signed)
Mobility Specialist - Progress Note   02/28/23 1518  Mobility  Activity Refused mobility;Dangled on edge of bed  Level of Assistance Modified independent, requires aide device or extra time  Assistive Device None  Activity Response Tolerated well  $Mobility charge 1 Mobility   Pt denied OOB amb to the recliner or in the hallway. Pt sat EOB ModI; extra time to bring trunk upright. Pt left sitting EOB with food tray present and needs within reach.   [name] Mobility Specialist 02/28/23 3:20 PM

## 2023-02-28 NOTE — Progress Notes (Signed)
[name]  PROGRESS NOTE   Subjective:   Patient seen sitting up in bed, alert and oriented Patient states he feels well today Remains on 2 L nasal cannula Reports he sat in the chair good while yesterday  Creatinine 1.92 Urine output 1.2 L.  Objective:  Vital signs: Blood pressure 110/79, pulse 92, temperature (!) 97.4 F (36.3 C), resp. rate 16, height 5\' 11"  (1.803 m), weight 84.2 kg, SpO2 (!) 89 %.  Intake/Output Summary (Last 24 hours) at 02/28/2023 1151 Last data filed at 02/28/2023 1059 Gross per 24 hour  Intake 245 ml  Output 1200 ml  Net -955 ml    Filed Weights   02/25/23 0500 02/26/23 0500 02/27/23 0407  Weight: 84.4 kg 80.7 kg 84.2 kg     Physical Exam: General:  No acute distress  Head:  Normocephalic, atraumatic. Moist oral mucosal membranes  Eyes:  Anicteric  Lungs:   Clear to auscultation, normal effort  Heart:  S1S2 no rubs  Abdomen:   Soft, nontender, bowel sounds present  Extremities:  No peripheral edema.  Neurologic:  Awake, alert, following commands  Skin:  No lesions  Access: None    Basic Metabolic Panel: Recent Labs  Lab 02/24/23 0450 02/25/23 0530 02/26/23 0607 02/27/23 0427 02/28/23 0708 02/28/23 0710  NA 134*  135 135 138 140 142 141  K 3.8  3.8 4.0 4.4 4.7 5.3* 5.3*  CL 102  103 101 106 105 102 103  CO2 26  28 28 28 30 31 29   GLUCOSE 119*  120* 125* 137* 115* 112* 108*  BUN 27*  28* 28* 29* 29* 29* 29*  CREATININE 2.05*  2.06* 2.11* 2.12* 1.92* 1.96* 1.92*  CALCIUM 8.3*  8.4* 8.3* 8.5* 8.6* 9.3 9.2  MG 2.0 1.9 1.8 1.9 2.0  --   PHOS 2.7 2.5 2.2* 2.5  --  3.0    GFR: Estimated Creatinine Clearance: 34.3 mL/min (A) (by C-G formula based on SCr of 1.92 mg/dL (H)).  Liver Function Tests: Recent Labs  Lab 02/24/23 0450 02/25/23 0530 02/26/23 0607 02/27/23 0427 02/28/23 0710  ALBUMIN 2.2* 2.3* 2.4* 2.4* 2.5*    No results for input(s): "LIPASE", "AMYLASE" in the last 168 hours. No results for  input(s): "AMMONIA" in the last 168 hours.  CBC: Recent Labs  Lab 02/22/23 0600 02/23/23 0407 02/25/23 0530 02/27/23 0427 02/28/23 0708  WBC  --  12.1* 11.0* 11.3* 10.7*  NEUTROABS  --   --   --  9.2* 8.7*  HGB 7.6* 7.5* 8.0* 8.3* 9.1*  HCT 24.5* 24.4* 25.7* 27.0* 30.5*  MCV  --  96.1 96.6 99.3 100.0  PLT  --  66* 103* 128* 142*      HbA1C: Hgb A1c MFr Bld  Date/Time Value Ref Range Status  10/02/2022 11:16 PM 6.3 (H) 4.8 - 5.6 % Final    Comment:    (NOTE) Pre diabetes:          5.7%-6.4%  Diabetes:              >6.4%  Glycemic control for   <7.0% adults with diabetes     Urinalysis: No results for input(s): "COLORURINE", "LABSPEC", "PHURINE", "GLUCOSEU", "HGBUR", "BILIRUBINUR", "KETONESUR", "PROTEINUR", "UROBILINOGEN", "NITRITE", "LEUKOCYTESUR" in the last 72 hours.  Invalid input(s): "APPERANCEUR"    Imaging: No results found.   Medications:    sodium chloride Stopped (02/21/23 0330)    vitamin C  500 mg Oral BID   Chlorhexidine Gluconate Cloth  6 each  Topical Daily   feeding supplement  237 mL Oral TID BM    morphine injection  2 mg Intravenous Once   multivitamin  1 tablet Oral QHS   pantoprazole  40 mg Oral BID    Assessment/ Plan:     John [name] is a 77 y.o. black male with chronic diastolic congestive heart failure, hypertension, COPD, hyperlipidemia, who is admitted to [facility] on 01/29/2023 for Peripheral edema [R60.9] Shock circulatory ([place]) [R57.9] Acute on chronic congestive heart failure, unspecified heart failure type ([place]) [I50.9] Acute renal failure superimposed on chronic kidney disease, unspecified acute renal failure type, unspecified CKD stage ([place]) [N17.9, N18.9]   #1: Acute kidney injury with chronic kidney disease stage IV. Acute kidney injury most likely secondary to cardiorenal syndrome  Developed hyperkalemia due to GI bleed and hypovolemic shock, was placed on CRRT on 02/22/23. CRRT terminated due to multiple clotting events.    Creatinine continues to improve and is currently improved beyond baseline.  Patient continues to have good urine output with minimal lower extremity edema.   #2 Acute respiratory failure requiring intubation: Extubated on 02/05/23.   Remains on 2L [place], denies any shortness of breath   #3: Hypotension: Patient had cardiogenic shock requiring pressors.  No pressors. Blood pressure remained stable  #4: Anemia with chronic kidney disease.  Will continue to monitor levels.    LOS: [facility] 3/15/202411:51 AM

## 2023-02-28 NOTE — TOC Progression Note (Signed)
Transition of Care [facility]) - Progression Note    Patient Details  Name: John [name] MRN: [id number] Date of Birth: 1946/09/29  Transition of Care [facility]) CM/SW Contact  Ross Ludwig, [place] Phone Number: 02/28/2023, 11:36 PM  Clinical Narrative:     Patient is being recommended for SNF placement will need insurance authorization once patient is closer to being medically ready for discharge.  Expected Discharge Plan:  (TBD) Barriers to Discharge: Continued Medical Work up  Expected Discharge Plan and Services   Discharge Planning Services: CM Consult   Living arrangements for the past 2 months: Single Family Home                 DME Arranged: N/A                     Social Determinants of Health (SDOH) Interventions SDOH Screenings   Food Insecurity: No Food Insecurity (01/29/2023)  Housing: Low Risk  (01/29/2023)  Transportation Needs: No Transportation Needs (01/29/2023)  Utilities: Not At Risk (01/29/2023)  Alcohol Screen: Low Risk  (07/17/2022)  Depression (PHQ2-9): Low Risk  (07/30/2022)  Financial Resource Strain: Low Risk  (07/17/2022)  Physical Activity: Sufficiently Active (07/17/2022)  Social Connections: Socially Integrated (07/17/2022)  Stress: No Stress Concern Present (07/17/2022)  Tobacco Use: Medium Risk (02/20/2023)    Readmission Risk Interventions    01/30/2023   11:09 AM  Readmission Risk Prevention Plan  Transportation Screening Complete  Medication Review ([place]) Complete  PCP or Specialist appointment within 3-5 days of discharge Complete  HRI or [place] Complete  SW Recovery Care/Counseling Consult Complete  Palliative Care Screening Complete  [place] Not Applicable

## 2023-02-28 NOTE — Progress Notes (Signed)
Progress Note   Patient: John [name] [id number] DOB: June 30, 1946 DOA: 01/29/2023     30 DOS: the patient was seen and examined on 02/28/2023    Objective: Patient was seen and examined at bedside this morning Continues to appear lethargic Patient moved from the ICU to the regular floor now Continues to require intranasal oxygen 2 L Denies chest pain nausea vomiting  Reviewed patient's BMP results today showing hyperkalemia for which hyperkalemia cocktail were ordered    Brief Narrative / Hospital Course:  77 year old male with a history of essential hypertension, on hydralazine and Cozaar, stage IV CKD but has not been on dialysis in the past, has chronic diastolic and systolic heart failure with cor pulmonale, moderate tricuspid regurgitation and aortic valve regurgitation, history of ascending aortic aneurysm, coronary artery disease with atherosclerosis, COPD and chronic hypoxemia baseline 2 L/min supplemental oxygen at home with recurrent episodes of hypoxemia and remote acute exacerbation of COPD with hypoxemia and hypercapnia 2 months ago history of ruptured left quadricep tendon 2 years ago, chronic thrombocytopenia and erythrocytosis recurrent lower extremity cellulitis, recurrent hyperkalemia, morbid obesity history of anasarca dyslipidemia, recent admission for acute CHF exacerbation requiring Lasix drip and BiPAP support, who was brought in by EMS due to worsening altered mental status and circulatory shock.     2/14: intubated, admitted to ICU for shock, CRRT 2/15- Echo w/ RV dysfunction and concern for PE. CTPE today.  He is weaned down on pressors  2/16- remains agitated intermittently overnight. Continued issues with clot burden through CRRT circuit. 2/17- remains agitated overnight. Requiring numerous ativan pushes. Attempting to wean down sedation. Failed SBT . 2/18- issues with CRRT overnight. Will attempt higher UF today. Assess for possible chest tube placement.   2/19- No issues with CRRT overnight, currently running without complication.  CT Head obtained overnight and negative for acute process. Off Precedex this morning, somnolent. SBT as mental status permits. Stop Cefepime, checking ammonia. 2/20- Remains on CRRT. Pt more awake and tracking.Tolerated SBT for 12 hrs  2/21- Pt remains mechanically intubated on minimal vent settings.  Pt able to track and open eyes on command, however unable to move BLE 2/22- Off vasopressors.  Palliative Care consulted pt stating he DOES NOT want any form of dialysis.  If pt remains stable will transfer service to [facility] later changed his mind on dialysis.  2/26: Placed perm cath for HD 2/27: status post hemodialysis.  Doing reasonably well.  Mental status improved.  Is pretty clear with [place] now that he does not want hemodialysis. 2/28 -patient has gone back and forth about wanting dialysis and not wanting dialysis.  He told the palliative team this morning that he did not want any further dialysis: Back to room states that he wants everything done now.  Nephrology is going to hold dialysis today and tomorrow and evaluate for Friday to see if he needs it given that he had dialysis in the last 2 days.  The dialysis catheter was placed 2 days ago is working well and patient is healing recovering as expected per the vascular team and they are recommending outpatient follow-up. 2/29: Dialysis held today and yesterday.  Monitoring for renal recovery. Creatinine started to rise.  Nephrology starting Lasix 80 mg IV. 3/4 removal of permcath. He made decision for no dialysis and as such dialysis catheter was removed  3/5: stable awaiting SNF  3/6 large volume hematochezia, duodenal bleed on CTA, EGD done, extubated post-procedure and on pressor support, s/p 3 units PRBC. States  he wants everything done to try to live, except CPR 3/7 Started on CRRT. Reiterates no CPR or repeat intubation in event of respiratory distress but intubation for  procedures is acceptable. OK w/ dialysis.  3/8 stable still on pressors and CRRT, potassium stable/WNL, more alert and conversational today, full liquid diet, GI team s/o. Received another unit PRBC.  3/9: off CRRT d/t clotting episodes, off pressors. O2 support max 10L HFNC but down to 4L [place] in afternoon.  3/10-03/11: remains stable VS and labs, down to 2L [place]. Transfer out of stepdown.  03/12: Plt improving, renal fxn stable, remains on 2L [place], will move to MedSurg and order PT/OT assessment, anticipate medical stability over next few days.  3/13-dialysis catheter removed today as patient has been cleared to have no indication for ongoing dialysis. 3/14-3/15 now moved from the ICU to the floor, working with physical therapist and pending discharge      Consultants:  Gastroenterology  Palliative Care  Cardiology Nephrology Vascular Surgery  ICU   Procedures: 02/14: intubation  CRRT 02/14-02/20 Emergent EGD 02/19/23 for duodenal bleed 01/29/23 Insertion of Non-tunneled Central Venous Catheter with US guidance  CRRT 03/07-03/09          ASSESSMENT & PLAN:   Principal Problem:   Shock circulatory ([place]) Active Problems:   Acute on chronic congestive heart failure ([place])   Acute hypoxic respiratory failure ([place])   Toxic metabolic encephalopathy   Hypovolemic shock d/t ABLA - GI bleed Acute blood loss anemia on anemia of chronic disease  Trend CBC/HH and transfuse as needed  Protonix IV bid  Currently off vasopressor support No anticoags     Hyperkalemia  Patient received hyperkalemia cocktail today Continue to monitor BMP   Cardiogenic shock POA d/t HFpEF - improved but now hypovolemic shock d/t GI bleed and Acute decompensated HFpEF and right-sided heart failure RV dysfunction and dilation on echocardiography, concerning for RV failure and pulmonary hypertension. Porto-pulmonary hypertension on the differential given history of liver cirrhosis.  02/19/23 w/ GI bleed and  hypovolemic shock he underwent aggressive fluid resuscitation and PRBC pending EGD, known risk w/ CHF and previous dialysis - monitor fluid status closely  CVVH for ultra-filtration for volume removal --> stopped  Cardiology had previously signed off, pt appears euvolemic  Nephrology is on board we appreciate input   Acute Hypoxic Respiratory Failure d/t HFpEF and hypovolemic shock Continue supplemental O2 as needed to maintain SpO2 >92% NO REINTUBATION if respiratory distress    Toxic Metabolic Encephalopathy - improved but waxes and wanes Patient's decision-making capacity to be limited but not absent. He is improving daily.       Acute kidney injury on chronic kidney disease stage IV CRRT d/c Nephrology following  Dialysis catheter removed 02/26/2023   Hepatic cirrhosis Thrombocytopenia.   Was on cefepime empirically given leukocytosis.  Cultures remain negative. Finished 5 days of antibiotics.  Monitor CBC, CMP     DVT prophylaxis: SCD Pertinent IV fluids/nutrition: diet as tolerated    Code Status: DNR   Current Admission Status: inpatient, med tele  TOC needs / Dispo plan: SNF Barriers to discharge / significant pending items: if remaining stable may be able to go to SNF in a few days      Family Communication: none at this time        Examination:  Physical Exam Constitutional:      General: He is not in acute distress.    Appearance: Not in acute distress on intranasal oxygen HENT:  Mouth/Throat:     Mouth: Mucous membranes are moist Cardiovascular:     Rate and Rhythm: Normal rate and regular rhythm.     Heart sounds: Normal heart sounds.  Pulmonary:     Effort: Pulmonary effort is normal. No respiratory distress.     Breath sounds: Normal breath sounds.  Abdominal:     Palpations: Abdomen is soft.  Musculoskeletal:     Right lower leg: Right lower leg edema: trace.     Left lower leg: Left lower leg edema: trace.  Skin:    General: Skin is warm  and dry.  Neurological:     General: No focal deficit present.     Mental Status: He is alert.       Vitals:   02/27/23 2045 02/28/23 0038 02/28/23 0321 02/28/23 0741  BP: 121/84 124/79 110/73 110/79  Pulse: 77 69 95 92  Resp: 20 (!) 21 (!) 21 16  Temp: 97.7 F (36.5 C) 98.8 F (37.1 C) 97.6 F (36.4 C) (!) 97.4 F (36.3 C)  TempSrc: Oral  Oral   SpO2: 90% 90% (!) 89%   Weight:      Height:        Author: Verline Lema, MD 02/28/2023 3:09 PM  For on call review [email or web address].

## 2023-03-01 DIAGNOSIS — R579 Shock, unspecified: Secondary | ICD-10-CM | POA: Diagnosis not present

## 2023-03-01 LAB — BASIC METABOLIC PANEL
Anion gap: 4 — ABNORMAL LOW (ref 5–15)
BUN: 28 mg/dL — ABNORMAL HIGH (ref 8–23)
CO2: 32 mmol/L (ref 22–32)
Calcium: 8.9 mg/dL (ref 8.9–10.3)
Chloride: 103 mmol/L (ref 98–111)
Creatinine, Ser: 1.9 mg/dL — ABNORMAL HIGH (ref 0.61–1.24)
GFR, Estimated: 36 mL/min — ABNORMAL LOW (ref >60)
Glucose, Bld: 131 mg/dL — ABNORMAL HIGH (ref 70–99)
Potassium: 5 mmol/L (ref 3.5–5.1)
Sodium: 139 mmol/L (ref 135–145)

## 2023-03-01 LAB — RENAL FUNCTION PANEL
Albumin: 2.6 g/dL — ABNORMAL LOW (ref 3.5–5.0)
Anion gap: 3 — ABNORMAL LOW (ref 5–15)
BUN: 30 mg/dL — ABNORMAL HIGH (ref 8–23)
CO2: 31 mmol/L (ref 22–32)
Calcium: 9 mg/dL (ref 8.9–10.3)
Chloride: 104 mmol/L (ref 98–111)
Creatinine, Ser: 1.92 mg/dL — ABNORMAL HIGH (ref 0.61–1.24)
GFR, Estimated: 35 mL/min — ABNORMAL LOW (ref >60)
Glucose, Bld: 137 mg/dL — ABNORMAL HIGH (ref 70–99)
Phosphorus: 3 mg/dL (ref 2.5–4.6)
Potassium: 5 mmol/L (ref 3.5–5.1)
Sodium: 138 mmol/L (ref 135–145)

## 2023-03-01 LAB — CBC WITH DIFFERENTIAL/PLATELET
Abs Immature Granulocytes: 0.1 10*3/uL — ABNORMAL HIGH (ref 0.00–0.07)
Basophils Absolute: 0 10*3/uL (ref 0.0–0.1)
Basophils Relative: 0 %
Eosinophils Absolute: 0.1 10*3/uL (ref 0.0–0.5)
Eosinophils Relative: 1 %
HCT: 30.4 % — ABNORMAL LOW (ref 39.0–52.0)
Hemoglobin: 9 g/dL — ABNORMAL LOW (ref 13.0–17.0)
Immature Granulocytes: 1 %
Lymphocytes Relative: 8 %
Lymphs Abs: 0.8 10*3/uL (ref 0.7–4.0)
MCH: 30 pg (ref 26.0–34.0)
MCHC: 29.6 g/dL — ABNORMAL LOW (ref 30.0–36.0)
MCV: 101.3 fL — ABNORMAL HIGH (ref 80.0–100.0)
Monocytes Absolute: 0.7 10*3/uL (ref 0.1–1.0)
Monocytes Relative: 8 %
Neutro Abs: 8.1 10*3/uL — ABNORMAL HIGH (ref 1.7–7.7)
Neutrophils Relative %: 82 %
Platelets: 155 10*3/uL (ref 150–400)
RBC: 3 MIL/uL — ABNORMAL LOW (ref 4.22–5.81)
RDW: 18.6 % — ABNORMAL HIGH (ref 11.5–15.5)
WBC: 9.8 10*3/uL (ref 4.0–10.5)
nRBC: 0 % (ref 0.0–0.2)

## 2023-03-01 LAB — MAGNESIUM: Magnesium: 1.7 mg/dL (ref 1.7–2.4)

## 2023-03-01 NOTE — Progress Notes (Signed)
[name]  PROGRESS NOTE   Subjective:   Patient seen sitting up in bed Able to sit edge of bed yesterday with therapy Appetite remains poor but improving Denies nausea or vomiting Remains on 2 L nasal cannula.  Creatinine 1.90 Urine output 1 L.  Objective:  Vital signs: Blood pressure 134/86, pulse 100, temperature 97.6 F (36.4 C), temperature source Oral, resp. rate 18, height 5\' 11"  (1.803 m), weight 83.4 kg, SpO2 95 %.  Intake/Output Summary (Last 24 hours) at 03/01/2023 1035 Last data filed at 03/01/2023 0851 Gross per 24 hour  Intake 535 ml  Output 1600 ml  Net -1065 ml    Filed Weights   02/26/23 0500 02/27/23 0407 03/01/23 0500  Weight: 80.7 kg 84.2 kg 83.4 kg     Physical Exam: General:  No acute distress  Head:  Normocephalic, atraumatic. Moist oral mucosal membranes  Eyes:  Anicteric  Lungs:   Clear to auscultation, normal effort  Heart:  S1S2 no rubs  Abdomen:   Soft, nontender, bowel sounds present  Extremities:  No peripheral edema.  Neurologic:  Awake, alert, following commands  Skin:  No lesions  Access: None    Basic Metabolic Panel: Recent Labs  Lab 02/25/23 0530 02/26/23 0607 02/27/23 0427 02/28/23 0708 02/28/23 0710 03/01/23 0416  NA 135 138 140 142 141 139  138  K 4.0 4.4 4.7 5.3* 5.3* 5.0  5.0  CL 101 106 105 102 103 103  104  CO2 28 28 30 31 29  32  31  GLUCOSE 125* 137* 115* 112* 108* 131*  137*  BUN 28* 29* 29* 29* 29* 28*  30*  CREATININE 2.11* 2.12* 1.92* 1.96* 1.92* 1.90*  1.92*  CALCIUM 8.3* 8.5* 8.6* 9.3 9.2 8.9  9.0  MG 1.9 1.8 1.9 2.0  --  1.7  PHOS 2.5 2.2* 2.5  --  3.0 3.0    GFR: Estimated Creatinine Clearance: 34.3 mL/min (A) (by C-G formula based on SCr of 1.92 mg/dL (H)).  Liver Function Tests: Recent Labs  Lab 02/25/23 0530 02/26/23 0607 02/27/23 0427 02/28/23 0710 03/01/23 0416  ALBUMIN 2.3* 2.4* 2.4* 2.5* 2.6*    No results for input(s): "LIPASE", "AMYLASE" in the last 168  hours. No results for input(s): "AMMONIA" in the last 168 hours.  CBC: Recent Labs  Lab 02/23/23 0407 02/25/23 0530 02/27/23 0427 02/28/23 0708 03/01/23 0416  WBC 12.1* 11.0* 11.3* 10.7* 9.8  NEUTROABS  --   --  9.2* 8.7* 8.1*  HGB 7.5* 8.0* 8.3* 9.1* 9.0*  HCT 24.4* 25.7* 27.0* 30.5* 30.4*  MCV 96.1 96.6 99.3 100.0 101.3*  PLT 66* 103* 128* 142* 155      HbA1C: Hgb A1c MFr Bld  Date/Time Value Ref Range Status  10/02/2022 11:16 PM 6.3 (H) 4.8 - 5.6 % Final    Comment:    (NOTE) Pre diabetes:          5.7%-6.4%  Diabetes:              >6.4%  Glycemic control for   <7.0% adults with diabetes     Urinalysis: No results for input(s): "COLORURINE", "LABSPEC", "PHURINE", "GLUCOSEU", "HGBUR", "BILIRUBINUR", "KETONESUR", "PROTEINUR", "UROBILINOGEN", "NITRITE", "LEUKOCYTESUR" in the last 72 hours.  Invalid input(s): "APPERANCEUR"    Imaging: No results found.   Medications:    sodium chloride Stopped (02/21/23 0330)    vitamin C  500 mg Oral BID   Chlorhexidine Gluconate Cloth  6 each Topical Daily   feeding supplement  237 mL Oral TID BM    morphine injection  2 mg Intravenous Once   multivitamin  1 tablet Oral QHS   pantoprazole  40 mg Oral BID    Assessment/ Plan:     John [name] is a 77 y.o. black male with chronic diastolic congestive heart failure, hypertension, COPD, hyperlipidemia, who is admitted to [facility] on 01/29/2023 for Peripheral edema [R60.9] Shock circulatory ([place]) [R57.9] Acute on chronic congestive heart failure, unspecified heart failure type ([place]) [I50.9] Acute renal failure superimposed on chronic kidney disease, unspecified acute renal failure type, unspecified CKD stage ([place]) [N17.9, N18.9]   #1: Acute kidney injury with chronic kidney disease stage IV. Acute kidney injury most likely secondary to cardiorenal syndrome  Developed hyperkalemia due to GI bleed and hypovolemic shock, was placed on CRRT on 02/22/23. CRRT terminated due to  multiple clotting events.  Received 2-3 hemodialysis treatments before recovery.  Creatinine remained stable at 1.90.  Good urine output noted at 1 L.   #2 Acute respiratory failure requiring intubation: Extubated on 02/05/23.   Supplemental oxygen in place.   #3: Hypotension: Patient had cardiogenic shock requiring pressors.  Blood pressure stable.  #4: Anemia with chronic kidney disease.  Hemoglobin remains at goal.    LOS: [facility] 3/16/202410:35 AM

## 2023-03-01 NOTE — TOC Progression Note (Signed)
Transition of Care [facility]) - Progression Note    Patient Details  Name: Orvall Shinabarger MRN: [id number] Date of Birth: 05/13/1946  Transition of [facility] CM/SW Contact  Zigmund Daniel Dorian Pod, RN Phone Number:[phone] 03/01/2023, 2:33 PM  Clinical Narrative:    [facility] RN attempted several outreach to pt and noted fiend in [name]) concerning available bed offers however unsuccessful. TOC will continue to reach out for possible decision on placement facility.  TOC remains available to assist with ongoing discharge disposition.   Expected Discharge Plan:  (TBD) Barriers to Discharge: Continued Medical Work up  Expected Discharge Plan and Services   Discharge Planning Services: CM Consult   Living arrangements for the past 2 months: Single Family Home                 DME Arranged: N/A                     Social Determinants of Health (SDOH) Interventions SDOH Screenings   Food Insecurity: No Food Insecurity (01/29/2023)  Housing: Low Risk  (01/29/2023)  Transportation Needs: No Transportation Needs (01/29/2023)  Utilities: Not At Risk (01/29/2023)  Alcohol Screen: Low Risk  (07/17/2022)  Depression (PHQ2-9): Low Risk  (07/30/2022)  Financial Resource Strain: Low Risk  (07/17/2022)  Physical Activity: Sufficiently Active (07/17/2022)  Social Connections: Socially Integrated (07/17/2022)  Stress: No Stress Concern Present (07/17/2022)  Tobacco Use: Medium Risk (02/20/2023)    Readmission Risk Interventions    01/30/2023   11:09 AM  Readmission Risk Prevention Plan  Transportation Screening Complete  Medication Review ([place]) Complete  PCP or Specialist appointment within 3-5 days of discharge Complete  HRI or [place] Complete  SW Recovery Care/Counseling Consult Complete  Palliative Care Screening Complete  [place] Not Applicable

## 2023-03-01 NOTE — Progress Notes (Signed)
Progress Note   Patient: John [name] [id number] DOB: 02/20/1946 DOA: 01/29/2023     31 DOS: the patient was seen and examined on 03/01/2023     Objective: Patient was seen and examined at bedside this morning Continues to require intranasal oxygen 2 L Denies chest pain nausea vomiting Currently awaiting skilled nursing facility placement BMP reviewed which shows improvement in potassium levels   Brief Narrative / Hospital Course:  77 year old male with a history of essential hypertension, on hydralazine and Cozaar, stage IV CKD but has not been on dialysis in the past, has chronic diastolic and systolic heart failure with cor pulmonale, moderate tricuspid regurgitation and aortic valve regurgitation, history of ascending aortic aneurysm, coronary artery disease with atherosclerosis, COPD and chronic hypoxemia baseline 2 L/min supplemental oxygen at home with recurrent episodes of hypoxemia and remote acute exacerbation of COPD with hypoxemia and hypercapnia 2 months ago history of ruptured left quadricep tendon 2 years ago, chronic thrombocytopenia and erythrocytosis recurrent lower extremity cellulitis, recurrent hyperkalemia, morbid obesity history of anasarca dyslipidemia, recent admission for acute CHF exacerbation requiring Lasix drip and BiPAP support, who was brought in by EMS due to worsening altered mental status and circulatory shock.     2/14: intubated, admitted to ICU for shock, CRRT 2/15- Echo w/ RV dysfunction and concern for PE. CTPE today.  He is weaned down on pressors  2/16- remains agitated intermittently overnight. Continued issues with clot burden through CRRT circuit. 2/17- remains agitated overnight. Requiring numerous ativan pushes. Attempting to wean down sedation. Failed SBT . 2/18- issues with CRRT overnight. Will attempt higher UF today. Assess for possible chest tube placement.  2/19- No issues with CRRT overnight, currently running without complication.  CT  Head obtained overnight and negative for acute process. Off Precedex this morning, somnolent. SBT as mental status permits. Stop Cefepime, checking ammonia. 2/20- Remains on CRRT. Pt more awake and tracking.Tolerated SBT for 12 hrs  2/21- Pt remains mechanically intubated on minimal vent settings.  Pt able to track and open eyes on command, however unable to move BLE 2/22- Off vasopressors.  Palliative Care consulted pt stating he DOES NOT want any form of dialysis.  If pt remains stable will transfer service to [facility] later changed his mind on dialysis.  2/26: Placed perm cath for HD 2/27: status post hemodialysis.  Doing reasonably well.  Mental status improved.  Is pretty clear with [place] now that he does not want hemodialysis. 2/28 -patient has gone back and forth about wanting dialysis and not wanting dialysis.  He told the palliative team this morning that he did not want any further dialysis: Back to room states that he wants everything done now.  Nephrology is going to hold dialysis today and tomorrow and evaluate for Friday to see if he needs it given that he had dialysis in the last 2 days.  The dialysis catheter was placed 2 days ago is working well and patient is healing recovering as expected per the vascular team and they are recommending outpatient follow-up. 2/29: Dialysis held today and yesterday.  Monitoring for renal recovery. Creatinine started to rise.  Nephrology starting Lasix 80 mg IV. 3/4 removal of permcath. He made decision for no dialysis and as such dialysis catheter was removed  3/5: stable awaiting SNF  3/6 large volume hematochezia, duodenal bleed on CTA, EGD done, extubated post-procedure and on pressor support, s/p 3 units PRBC. States he wants everything done to try to live, except CPR 3/7 Started on CRRT.  Reiterates no CPR or repeat intubation in event of respiratory distress but intubation for procedures is acceptable. OK w/ dialysis.  3/8 stable still on pressors and CRRT,  potassium stable/WNL, more alert and conversational today, full liquid diet, GI team s/o. Received another unit PRBC.  3/9: off CRRT d/t clotting episodes, off pressors. O2 support max 10L HFNC but down to 4L [place] in afternoon.  3/10-03/11: remains stable VS and labs, down to 2L [place]. Transfer out of stepdown.  03/12: Plt improving, renal fxn stable, remains on 2L , will move to MedSurg and order PT/OT assessment, anticipate medical stability over next few days.  3/13-dialysis catheter removed today as patient has been cleared to have no indication for ongoing dialysis. 3/14-3/15 now moved from the ICU to the floor, working with physical therapist and pending discharge       Consultants:  Gastroenterology  Palliative Care  Cardiology Nephrology Vascular Surgery  ICU   Procedures: 02/14: intubation  CRRT 02/14-02/20 Emergent EGD 02/19/23 for duodenal bleed 01/29/23 Insertion of Non-tunneled Central Venous Catheter with US guidance  CRRT 03/07-03/09          ASSESSMENT & PLAN:   Principal Problem:   Shock circulatory ([place]) Active Problems:   Acute on chronic congestive heart failure ([place])   Acute hypoxic respiratory failure ([place])   Toxic metabolic encephalopathy   Hypovolemic shock d/t ABLA - GI bleed Acute blood loss anemia on anemia of chronic disease  Trend CBC/HH and transfuse as needed  Protonix IV bid  Currently off vasopressor support No anticoags      Hyperkalemia-improved Patient received hyperkalemia cocktail today Continue to monitor BMP   Cardiogenic shock POA d/t HFpEF - improved but now hypovolemic shock d/t GI bleed and Acute decompensated HFpEF and right-sided heart failure RV dysfunction and dilation on echocardiography, concerning for RV failure and pulmonary hypertension. Porto-pulmonary hypertension on the differential given history of liver cirrhosis.  02/19/23 w/ GI bleed and hypovolemic shock he underwent aggressive fluid resuscitation and PRBC  pending EGD, known risk w/ CHF and previous dialysis - monitor fluid status closely  CVVH for ultra-filtration for volume removal --> stopped  Cardiology had previously signed off, pt appears euvolemic  Nephrology is on board we appreciate input   Acute Hypoxic Respiratory Failure d/t HFpEF and hypovolemic shock Continue supplemental O2 as needed to maintain SpO2 >92% NO REINTUBATION if respiratory distress    Toxic Metabolic Encephalopathy - improved but waxes and wanes Patient's decision-making capacity to be limited but not absent. He is improving daily.       Acute kidney injury on chronic kidney disease stage IV CRRT d/c Nephrology following  Dialysis catheter removed 02/26/2023   Hepatic cirrhosis Thrombocytopenia.   Was on cefepime empirically given leukocytosis.  Cultures remain negative. Finished 5 days of antibiotics.  Monitor CBC, CMP     DVT prophylaxis: SCD Pertinent IV fluids/nutrition: diet as tolerated    Code Status: DNR   Current Admission Status: inpatient, med tele  TOC needs / Dispo plan: SNF Barriers to discharge / significant pending items: if remaining stable may be able to go to SNF in a few days      Family Communication: none at this time        Examination:  Physical Exam Constitutional:      General: He is not in acute distress.    Appearance: Not in acute distress on intranasal oxygen HENT:     Mouth/Throat:     Mouth: Mucous membranes are  moist Cardiovascular:     Rate and Rhythm: Normal rate and regular rhythm.     Heart sounds: Normal heart sounds.  Pulmonary:     Effort: Pulmonary effort is normal. No respiratory distress.     Breath sounds: Normal breath sounds.  Abdominal:     Palpations: Abdomen is soft.  Musculoskeletal:     Right lower leg: Right lower leg edema: trace.     Left lower leg: Left lower leg edema: trace.  Skin:    General: Skin is warm and dry.  Neurological:     General: No focal deficit present.      Mental Status: He is alert.       Physical Exam: Vitals:   02/28/23 1948 03/01/23 0500 03/01/23 0515 03/01/23 0826  BP: 104/74  110/74 134/86  Pulse: 85  86 100  Resp: 18  18 18   Temp: 97.8 F (36.6 C)  98 F (36.7 C) 97.6 F (36.4 C)  TempSrc: Oral  Oral Oral  SpO2: 100%  100% 95%  Weight:  83.4 kg    Height:        Author: Verline Lema, MD 03/01/2023 12:12 PM  For on call review [email or web address].

## 2023-03-01 NOTE — Progress Notes (Signed)
Occupational Therapy Treatment Patient Details Name: John [name] MRN: [id number] DOB: [birth year] Today's Date: 03/01/2023   History of present illness Pt is a 77 y/o M admitted on 01/29/23 for hypovolemic shock 2/2 ABLA due to GI bleed.. Pt was intubated & CRRT was initiated. Pt found to have duodenal bleed & EGD done on 02/19/23. Pt with dialysis catheter removed on 02/26/23. PT has been re-consulted for PT re-evaluation. PMH: HTN, CKD Stage IV, chronic diastolic & systolic heart failure with cor pulmonale, moderate tricuspid regurgitation, AAA, CAD, COPD, chronic hypoxemia using 2L/min baseline O2, ruptured L quadriceps tendon 2 years ago, chronic thrombocytopenia, erythrocytosis, LE cellulitis, recurrent hyperkalemia, morbid obesity, anasarca, dyslipidema   OT comments  Patient received supine in bed and agreeable to OT. Tx session targeted increasing activity tolerance for improved ADL completion. Pt able to come to EOB with Min guard this date. Once sitting EOB, he engaged in grooming and UB dressing tasks. Pt stood from elevated EOB with Min A and found to have incontinent BM. He required Max A for posterior hygiene. Pt was able to take several lateral steps at EOB with Min A using RW before returning to supine. Pt left as received with all needs in reach. Pt is making progress toward goal completion. D/C recommendation remains appropriate. OT will continue to follow acutely.    Recommendations for follow up therapy are one component of a multi-disciplinary discharge planning process, led by the attending physician.  Recommendations may be updated based on patient status, additional functional criteria and insurance authorization.    Follow Up Recommendations  Skilled nursing-short term rehab (<3 hours/day)     Assistance Recommended at Discharge Frequent or constant Supervision/Assistance  Patient can return home with the following  Help with stairs or ramp for entrance;Assist for  transportation;Assistance with cooking/housework;A lot of help with bathing/dressing/bathroom;Two people to help with walking and/or transfers   Equipment Recommendations   (defer to next level of care)    Recommendations for Other Services      Precautions / Restrictions Precautions Precautions: Fall Restrictions Weight Bearing Restrictions: No Other Position/Activity Restrictions: Tunneled hemodialysis catheter via the right internal jugular vein placed 2/26       Mobility Bed Mobility Overal bed mobility: Needs Assistance Bed Mobility: Supine to Sit, Sit to Supine     Supine to sit: Min guard, HOB elevated (increased time/effort required, use of bed rails) Sit to supine: Mod assist (to manage BLEs and then adjust trunk once in supine)        Transfers Overall transfer level: Needs assistance Equipment used: Rolling walker (2 wheels) Transfers: Sit to/from Stand Sit to Stand: From elevated surface, Min assist                 Balance Overall balance assessment: Needs assistance Sitting-balance support: Feet supported, Bilateral upper extremity supported Sitting balance-Leahy Scale: Good     Standing balance support: Bilateral upper extremity supported, Reliant on assistive device for balance, During functional activity Standing balance-Leahy Scale: Fair Standing balance comment: tolerated standing for several minutes during posterior hygiene                           ADL either performed or assessed with clinical judgement   ADL Overall ADL's : Needs assistance/impaired     Grooming: Wash/dry face;Sitting;Set up;Supervision/safety           Upper Body Dressing : Minimal assistance;Sitting Upper Body Dressing Details (indicate cue type and  reason): to don/doff gown     Toilet Transfer: Rolling walker (2 wheels);Minimal assistance Toilet Transfer Details (indicate cue type and reason): simulated from elevated EOB Toileting- Clothing  Manipulation and Hygiene: Maximal assistance;Sit to/from stand Toileting - Clothing Manipulation Details (indicate cue type and reason): for posterior hygiene after incontinent BM     Functional mobility during ADLs: Minimal assistance;Rolling walker (2 wheels) (to take ~5 lateral steps toward [facility], assist for RW management)      Extremity/Trunk Assessment Upper Extremity Assessment Upper Extremity Assessment: Generalized weakness   Lower Extremity Assessment Lower Extremity Assessment: Generalized weakness        Vision Patient Visual Report: No change from baseline     Perception     Praxis      Cognition Arousal/Alertness: Awake/alert Behavior During Therapy: Flat affect, WFL for tasks assessed/performed Overall Cognitive Status: Within Functional Limits for tasks assessed           General Comments: Follows simple commands with extra time during session        Exercises      Shoulder Instructions       General Comments      Pertinent Vitals/ Pain       Pain Assessment Pain Assessment: No/denies pain  Home Living            Prior Functioning/Environment              Frequency  Min 2X/week        Progress Toward Goals  OT Goals(current goals can now be found in the care plan section)  Progress towards OT goals: Progressing toward goals  Acute Rehab OT Goals Patient Stated Goal: to increased independence OT Goal Formulation: With patient/family Time For Goal Achievement: 03/13/23 Potential to Achieve Goals: Good  Plan Discharge plan remains appropriate;Frequency remains appropriate    Co-evaluation                 AM-PAC OT "6 Clicks" Daily Activity     Outcome Measure   Help from another person eating meals?: A Little Help from another person taking care of personal grooming?: A Little Help from another person toileting, which includes using toliet, bedpan, or urinal?: A Lot Help from another person bathing (including  washing, rinsing, drying)?: A Lot Help from another person to put on and taking off regular upper body clothing?: A Little Help from another person to put on and taking off regular lower body clothing?: A Lot 6 Click Score: 15    End of Session Equipment Utilized During Treatment: Gait belt;Rolling walker (2 wheels)  OT Visit Diagnosis: Unsteadiness on feet (R26.81);Repeated falls (R29.6);Muscle weakness (generalized) (M62.81)   Activity Tolerance Patient tolerated treatment well   Patient Left in bed;with call bell/phone within reach;with bed alarm set   Nurse Communication Mobility status        Time: 1207-1227 OT Time Calculation (min): 20 min  Charges: OT General Charges $OT Visit: 1 Visit OT Treatments $Self Care/Home Management : 8-22 mins  [facility] MS, OTR/L ascom [phone]  03/01/23, 2:34 PM

## 2023-03-02 DIAGNOSIS — R579 Shock, unspecified: Secondary | ICD-10-CM | POA: Diagnosis not present

## 2023-03-02 LAB — CBC WITH DIFFERENTIAL/PLATELET
Abs Immature Granulocytes: 0.08 10*3/uL — ABNORMAL HIGH (ref 0.00–0.07)
Basophils Absolute: 0 10*3/uL (ref 0.0–0.1)
Basophils Relative: 0 %
Eosinophils Absolute: 0.1 10*3/uL (ref 0.0–0.5)
Eosinophils Relative: 1 %
HCT: 27.9 % — ABNORMAL LOW (ref 39.0–52.0)
Hemoglobin: 8.5 g/dL — ABNORMAL LOW (ref 13.0–17.0)
Immature Granulocytes: 1 %
Lymphocytes Relative: 9 %
Lymphs Abs: 0.8 10*3/uL (ref 0.7–4.0)
MCH: 30.1 pg (ref 26.0–34.0)
MCHC: 30.5 g/dL (ref 30.0–36.0)
MCV: 98.9 fL (ref 80.0–100.0)
Monocytes Absolute: 0.6 10*3/uL (ref 0.1–1.0)
Monocytes Relative: 7 %
Neutro Abs: 7.7 10*3/uL (ref 1.7–7.7)
Neutrophils Relative %: 82 %
Platelets: 158 10*3/uL (ref 150–400)
RBC: 2.82 MIL/uL — ABNORMAL LOW (ref 4.22–5.81)
RDW: 18.9 % — ABNORMAL HIGH (ref 11.5–15.5)
WBC: 9.3 10*3/uL (ref 4.0–10.5)
nRBC: 0 % (ref 0.0–0.2)

## 2023-03-02 LAB — BASIC METABOLIC PANEL
Anion gap: 5 (ref 5–15)
BUN: 30 mg/dL — ABNORMAL HIGH (ref 8–23)
CO2: 28 mmol/L (ref 22–32)
Calcium: 8.8 mg/dL — ABNORMAL LOW (ref 8.9–10.3)
Chloride: 104 mmol/L (ref 98–111)
Creatinine, Ser: 1.83 mg/dL — ABNORMAL HIGH (ref 0.61–1.24)
GFR, Estimated: 38 mL/min — ABNORMAL LOW (ref >60)
Glucose, Bld: 93 mg/dL (ref 70–99)
Potassium: 4.5 mmol/L (ref 3.5–5.1)
Sodium: 137 mmol/L (ref 135–145)

## 2023-03-02 LAB — RENAL FUNCTION PANEL
Albumin: 2.5 g/dL — ABNORMAL LOW (ref 3.5–5.0)
Anion gap: 4 — ABNORMAL LOW (ref 5–15)
BUN: 29 mg/dL — ABNORMAL HIGH (ref 8–23)
CO2: 28 mmol/L (ref 22–32)
Calcium: 8.7 mg/dL — ABNORMAL LOW (ref 8.9–10.3)
Chloride: 103 mmol/L (ref 98–111)
Creatinine, Ser: 1.86 mg/dL — ABNORMAL HIGH (ref 0.61–1.24)
GFR, Estimated: 37 mL/min — ABNORMAL LOW (ref >60)
Glucose, Bld: 93 mg/dL (ref 70–99)
Phosphorus: 2.6 mg/dL (ref 2.5–4.6)
Potassium: 4.5 mmol/L (ref 3.5–5.1)
Sodium: 135 mmol/L (ref 135–145)

## 2023-03-02 LAB — MAGNESIUM: Magnesium: 1.7 mg/dL (ref 1.7–2.4)

## 2023-03-02 NOTE — TOC Progression Note (Signed)
Transition of Care [facility]) - Progression Note    Patient Details  Name: John [name] MRN: [id number] Date of Birth: February 27, 1946  Transition of Care [facility]) CM/SW [place], [place] Phone Number: 03/02/2023, 9:45 AM  Clinical Narrative:     CSW spoke with pt's friend [place] regarding SNF choice, [name] chooses [facility]. CSW explained insurance auth process as well as Medicare.gov ratings site. All questions answered.  CSW spoke with pt by phone and attempted to discuss bed offers, pt immediately said he would call CSW back. Per Chart review pt is not oriented to situation, RN states pt was doing much better today.    Expected Discharge Plan:  (TBD) Barriers to Discharge: Continued Medical Work up  Expected Discharge Plan and Services   Discharge Planning Services: CM Consult   Living arrangements for the past 2 months: Single Family Home                 DME Arranged: N/A                     Social Determinants of Health (SDOH) Interventions SDOH Screenings   Food Insecurity: No Food Insecurity (01/29/2023)  Housing: Low Risk  (01/29/2023)  Transportation Needs: No Transportation Needs (01/29/2023)  Utilities: Not At Risk (01/29/2023)  Alcohol Screen: Low Risk  (07/17/2022)  Depression (PHQ2-9): Low Risk  (07/30/2022)  Financial Resource Strain: Low Risk  (07/17/2022)  Physical Activity: Sufficiently Active (07/17/2022)  Social Connections: Socially Integrated (07/17/2022)  Stress: No Stress Concern Present (07/17/2022)  Tobacco Use: Medium Risk (02/20/2023)    Readmission Risk Interventions    01/30/2023   11:09 AM  Readmission Risk Prevention Plan  Transportation Screening Complete  Medication Review ([place]) Complete  PCP or Specialist appointment within 3-5 days of discharge Complete  HRI or [place] Complete  SW Recovery Care/Counseling Consult Complete  Palliative Care Screening Complete  [place] Not Applicable

## 2023-03-02 NOTE — Progress Notes (Signed)
Progress Note   Patient: John [name] [id number] DOB: October 09, 1946 DOA: 01/29/2023     32 DOS: the patient was seen and examined on 03/02/2023     Objective: Patient was seen and examined at bedside this morning Did not seem interested in conversation today Denies nausea vomiting or worsening respiratory function Currently awaiting skilled nursing facility placement   Brief Narrative / Hospital Course:  77 year old male with a history of essential hypertension, on hydralazine and Cozaar, stage IV CKD but has not been on dialysis in the past, has chronic diastolic and systolic heart failure with cor pulmonale, moderate tricuspid regurgitation and aortic valve regurgitation, history of ascending aortic aneurysm, coronary artery disease with atherosclerosis, COPD and chronic hypoxemia baseline 2 L/min supplemental oxygen at home with recurrent episodes of hypoxemia and remote acute exacerbation of COPD with hypoxemia and hypercapnia 2 months ago history of ruptured left quadricep tendon 2 years ago, chronic thrombocytopenia and erythrocytosis recurrent lower extremity cellulitis, recurrent hyperkalemia, morbid obesity history of anasarca dyslipidemia, recent admission for acute CHF exacerbation requiring Lasix drip and BiPAP support, who was brought in by EMS due to worsening altered mental status and circulatory shock.     2/14: intubated, admitted to ICU for shock, CRRT 2/15- Echo w/ RV dysfunction and concern for PE. CTPE today.  He is weaned down on pressors  2/16- remains agitated intermittently overnight. Continued issues with clot burden through CRRT circuit. 2/17- remains agitated overnight. Requiring numerous ativan pushes. Attempting to wean down sedation. Failed SBT . 2/18- issues with CRRT overnight. Will attempt higher UF today. Assess for possible chest tube placement.  2/19- No issues with CRRT overnight, currently running without complication.  CT Head obtained overnight and  negative for acute process. Off Precedex this morning, somnolent. SBT as mental status permits. Stop Cefepime, checking ammonia. 2/20- Remains on CRRT. Pt more awake and tracking.Tolerated SBT for 12 hrs  2/21- Pt remains mechanically intubated on minimal vent settings.  Pt able to track and open eyes on command, however unable to move BLE 2/22- Off vasopressors.  Palliative Care consulted pt stating he DOES NOT want any form of dialysis.  If pt remains stable will transfer service to [facility] later changed his mind on dialysis.  2/26: Placed perm cath for HD 2/27: status post hemodialysis.  Doing reasonably well.  Mental status improved.  Is pretty clear with [place] now that he does not want hemodialysis. 2/28 -patient has gone back and forth about wanting dialysis and not wanting dialysis.  He told the palliative team this morning that he did not want any further dialysis: Back to room states that he wants everything done now.  Nephrology is going to hold dialysis today and tomorrow and evaluate for Friday to see if he needs it given that he had dialysis in the last 2 days.  The dialysis catheter was placed 2 days ago is working well and patient is healing recovering as expected per the vascular team and they are recommending outpatient follow-up. 2/29: Dialysis held today and yesterday.  Monitoring for renal recovery. Creatinine started to rise.  Nephrology starting Lasix 80 mg IV. 3/4 removal of permcath. He made decision for no dialysis and as such dialysis catheter was removed  3/5: stable awaiting SNF  3/6 large volume hematochezia, duodenal bleed on CTA, EGD done, extubated post-procedure and on pressor support, s/p 3 units PRBC. States he wants everything done to try to live, except CPR 3/7 Started on CRRT. Reiterates no CPR or repeat intubation  in event of respiratory distress but intubation for procedures is acceptable. OK w/ dialysis.  3/8 stable still on pressors and CRRT, potassium stable/WNL, more  alert and conversational today, full liquid diet, GI team s/o. Received another unit PRBC.  3/9: off CRRT d/t clotting episodes, off pressors. O2 support max 10L HFNC but down to 4L [place] in afternoon.  3/10-03/11: remains stable VS and labs, down to 2L [place]. Transfer out of stepdown.  03/12: Plt improving, renal fxn stable, remains on 2L [place], will move to MedSurg and order PT/OT assessment, anticipate medical stability over next few days.  3/13-dialysis catheter removed today as patient has been cleared to have no indication for ongoing dialysis. 3/14-3/15 now moved from the ICU to the floor, working with physical therapist and pending discharge 3/16-present: Still waiting for skilled nursing facility placement       Consultants:  Gastroenterology  Palliative Care  Cardiology Nephrology Vascular Surgery  ICU   Procedures: 02/14: intubation  CRRT 02/14-02/20 Emergent EGD 02/19/23 for duodenal bleed 01/29/23 Insertion of Non-tunneled Central Venous Catheter with US guidance  CRRT 03/07-03/09          ASSESSMENT & PLAN:   Principal Problem:   Shock circulatory ([place]) Active Problems:   Acute on chronic congestive heart failure ([place])   Acute hypoxic respiratory failure ([place])   Toxic metabolic encephalopathy   Hypovolemic shock d/t ABLA - GI bleed Acute blood loss anemia on anemia of chronic disease  Trend CBC/HH and transfuse as needed  Protonix IV bid  Currently off vasopressor support No anticoags      Hyperkalemia-improved Continue to monitor BMP   Cardiogenic shock POA d/t HFpEF - improved but now hypovolemic shock d/t GI bleed and Acute decompensated HFpEF and right-sided heart failure RV dysfunction and dilation on echocardiography, concerning for RV failure and pulmonary hypertension. Porto-pulmonary hypertension on the differential given history of liver cirrhosis.  02/19/23 w/ GI bleed and hypovolemic shock he underwent aggressive fluid resuscitation and PRBC pending  EGD, known risk w/ CHF and previous dialysis - monitor fluid status closely  CVVH for ultra-filtration for volume removal --> stopped  Cardiology had previously signed off, pt appears euvolemic  Nephrology is on board we appreciate input   Acute Hypoxic Respiratory Failure d/t HFpEF and hypovolemic shock Continue supplemental O2 as needed to maintain SpO2 >92% NO REINTUBATION if respiratory distress    Toxic Metabolic Encephalopathy - improved but waxes and wanes Patient's decision-making capacity to be limited but not absent. He is improving daily.       Acute kidney injury on chronic kidney disease stage IV CRRT d/c Nephrology following  Dialysis catheter removed 02/26/2023   Hepatic cirrhosis Thrombocytopenia.   Was on cefepime empirically given leukocytosis.  Cultures remain negative. Finished 5 days of antibiotics.  Monitor CBC, CMP     DVT prophylaxis: SCD Pertinent IV fluids/nutrition: diet as tolerated    Code Status: DNR   Current Admission Status: inpatient, med tele  TOC needs / Dispo plan: SNF Barriers to discharge / significant pending items:  Waiting skilled nursing facility placement Family Communication: none at this time     Physical Exam Constitutional:      General: He is not in acute distress.    Appearance: Not in acute distress on intranasal oxygen HENT:     Mouth/Throat:     Mouth: Mucous membranes are moist Cardiovascular:     Rate and Rhythm: Normal rate and regular rhythm.     Heart sounds: Normal  heart sounds.  Pulmonary:     Effort: Pulmonary effort is normal. No respiratory distress.     Breath sounds: Normal breath sounds.  Abdominal:     Palpations: Abdomen is soft.  Musculoskeletal: Bilateral lower extremity pitting edema-improved, has stasis dermatitis      General: Skin is warm and dry.  Neurological:     General: No focal deficit present.     Mental Status: He is alert.       Vitals:   03/01/23 1927 03/02/23 0443 03/02/23  0444 03/02/23 0743  BP: 111/79  125/89 106/62  Pulse: (!) 101  (!) 105 (!) 107  Resp: 20  20 18   Temp: [id number] F (36.4 C)  97.7 F (36.5 C) 97.8 F (36.6 C)  TempSrc: Oral  Oral   SpO2: 91%  90% 93%  Weight:  85.5 kg    Height:        Author: Verline Lema, MD 03/02/2023 12:49 PM  For on call review [email or web address].

## 2023-03-03 DIAGNOSIS — R579 Shock, unspecified: Secondary | ICD-10-CM | POA: Diagnosis not present

## 2023-03-03 LAB — RENAL FUNCTION PANEL
Albumin: 2.6 g/dL — ABNORMAL LOW (ref 3.5–5.0)
Anion gap: 5 (ref 5–15)
BUN: 31 mg/dL — ABNORMAL HIGH (ref 8–23)
CO2: 30 mmol/L (ref 22–32)
Calcium: 8.8 mg/dL — ABNORMAL LOW (ref 8.9–10.3)
Chloride: 101 mmol/L (ref 98–111)
Creatinine, Ser: 1.96 mg/dL — ABNORMAL HIGH (ref 0.61–1.24)
GFR, Estimated: 35 mL/min — ABNORMAL LOW (ref >60)
Glucose, Bld: 102 mg/dL — ABNORMAL HIGH (ref 70–99)
Phosphorus: 3.1 mg/dL (ref 2.5–4.6)
Potassium: 4.7 mmol/L (ref 3.5–5.1)
Sodium: 136 mmol/L (ref 135–145)

## 2023-03-03 LAB — MAGNESIUM: Magnesium: 1.8 mg/dL (ref 1.7–2.4)

## 2023-03-03 NOTE — TOC Progression Note (Signed)
Transition of Care [facility]) - Progression Note    Patient Details  Name: Gunnar Razzak MRN: [id number] Date of Birth: Apr 29, 1946  Transition of Care [facility]) CM/SW Contact  Beverly Sessions, RN Phone Number: 03/03/2023, 3:08 PM  Clinical Narrative:     Per MD patient medically ready for discharge Insurance Josem Kaufmann has approved for [facility] Per Hilda Blades at [facility] they can accept patient in the morning.  Will need Signed DNR on chart.  MD notified   Expected Discharge Plan:  (TBD) Barriers to Discharge: Continued Medical Work up  Expected Discharge Plan and Services   Discharge Planning Services: CM Consult   Living arrangements for the past 2 months: Single Family Home                 DME Arranged: N/A                     Social Determinants of Health (SDOH) Interventions SDOH Screenings   Food Insecurity: No Food Insecurity (01/29/2023)  Housing: Low Risk  (01/29/2023)  Transportation Needs: No Transportation Needs (01/29/2023)  Utilities: Not At Risk (01/29/2023)  Alcohol Screen: Low Risk  (07/17/2022)  Depression (PHQ2-9): Low Risk  (07/30/2022)  Financial Resource Strain: Low Risk  (07/17/2022)  Physical Activity: Sufficiently Active (07/17/2022)  Social Connections: Socially Integrated (07/17/2022)  Stress: No Stress Concern Present (07/17/2022)  Tobacco Use: Medium Risk (02/20/2023)    Readmission Risk Interventions    01/30/2023   11:09 AM  Readmission Risk Prevention Plan  Transportation Screening Complete  Medication Review ([place]) Complete  PCP or Specialist appointment within 3-5 days of discharge Complete  HRI or [place] Complete  SW Recovery Care/Counseling Consult Complete  Palliative Care Screening Complete  [place] Not Applicable

## 2023-03-03 NOTE — Progress Notes (Signed)
Mobility Specialist - Progress Note   03/03/23 1052  Mobility  Activity Ambulated with assistance in room;Transferred from bed to chair  Level of Assistance Standby assist, set-up cues, supervision of patient - no hands on  Assistive Device Front wheel walker  Distance Ambulated (ft) 4 ft  Activity Response Tolerated well  $Mobility charge 1 Mobility   Pt sitting EOB upon entry, utilizing RA. Pt agreeable to OOB amb to the recliner. Pt STS to RW MinA, stood at bedside for 1~2 mins while RN completed pedi care. Pt amb to the recliner SBA, left seated with alarm set and needs within reach.   [name] Mobility Specialist 03/03/23 10:55 AM

## 2023-03-03 NOTE — Progress Notes (Signed)
[name]  PROGRESS NOTE   Subjective:   Patient seen resting in bed, alert and oriented No family at bedside Oxygen requirement slightly increased to 3 L Tolerating meals without nausea or vomiting Trace lower extremity edema  Creatinine 1.96 Urine output 800 ml overnight  Objective:  Vital signs: Blood pressure 131/76, pulse 97, temperature 98.1 F (36.7 C), resp. rate 18, height 5\' 11"  (1.803 m), weight 84 kg, SpO2 94 %.  Intake/Output Summary (Last 24 hours) at 03/03/2023 1233 Last data filed at 03/03/2023 1019 Gross per 24 hour  Intake 420 ml  Output 800 ml  Net -380 ml    Filed Weights   03/01/23 0500 03/02/23 0443 03/03/23 0500  Weight: 83.4 kg 85.5 kg 84 kg     Physical Exam: General:  No acute distress  Head:  Normocephalic, atraumatic. Moist oral mucosal membranes  Eyes:  Anicteric  Lungs:   Clear to auscultation, normal effort  Heart:  S1S2 no rubs  Abdomen:   Soft, nontender, bowel sounds present  Extremities: Trace peripheral edema.  Neurologic:  Awake, alert, following commands  Skin:  No lesions  Access: None    Basic Metabolic Panel: Recent Labs  Lab 02/27/23 0427 02/28/23 0708 02/28/23 0710 03/01/23 0416 03/02/23 0411 03/02/23 0412 03/03/23 0428  NA 140 142 141 139  138 137 135 136  K 4.7 5.3* 5.3* 5.0  5.0 4.5 4.5 4.7  CL 105 102 103 103  104 104 103 101  CO2 30 31 29  32  31 28 28 30   GLUCOSE 115* 112* 108* 131*  137* 93 93 102*  BUN 29* 29* 29* 28*  30* 30* 29* 31*  CREATININE 1.92* 1.96* 1.92* 1.90*  1.92* 1.83* 1.86* 1.96*  CALCIUM 8.6* 9.3 9.2 8.9  9.0 8.8* 8.7* 8.8*  MG 1.9 2.0  --  1.7 1.7  --  1.8  PHOS 2.5  --  3.0 3.0  --  2.6 3.1    GFR: Estimated Creatinine Clearance: 33.6 mL/min (A) (by C-G formula based on SCr of 1.96 mg/dL (H)).  Liver Function Tests: Recent Labs  Lab 02/27/23 0427 02/28/23 0710 03/01/23 0416 03/02/23 0412 03/03/23 0428  ALBUMIN 2.4* 2.5* 2.6* 2.5* 2.6*    No  results for input(s): "LIPASE", "AMYLASE" in the last 168 hours. No results for input(s): "AMMONIA" in the last 168 hours.  CBC: Recent Labs  Lab 02/25/23 0530 02/27/23 0427 02/28/23 0708 03/01/23 0416 03/02/23 0411  WBC 11.0* 11.3* 10.7* 9.8 9.3  NEUTROABS  --  9.2* 8.7* 8.1* 7.7  HGB 8.0* 8.3* 9.1* 9.0* 8.5*  HCT 25.7* 27.0* 30.5* 30.4* 27.9*  MCV 96.6 99.3 100.0 101.3* 98.9  PLT 103* 128* 142* 155 158      HbA1C: Hgb A1c MFr Bld  Date/Time Value Ref Range Status  10/02/2022 11:16 PM 6.3 (H) 4.8 - 5.6 % Final    Comment:    (NOTE) Pre diabetes:          5.7%-6.4%  Diabetes:              >6.4%  Glycemic control for   <7.0% adults with diabetes     Urinalysis: No results for input(s): "COLORURINE", "LABSPEC", "PHURINE", "GLUCOSEU", "HGBUR", "BILIRUBINUR", "KETONESUR", "PROTEINUR", "UROBILINOGEN", "NITRITE", "LEUKOCYTESUR" in the last 72 hours.  Invalid input(s): "APPERANCEUR"    Imaging: No results found.   Medications:    sodium chloride Stopped (02/21/23 0330)    vitamin C  500 mg Oral BID   feeding supplement  237 mL Oral TID BM    morphine injection  2 mg Intravenous Once   multivitamin  1 tablet Oral QHS   pantoprazole  40 mg Oral BID    Assessment/ Plan:     Jovonta Peche is a 77 y.o. black male with chronic diastolic congestive heart failure, hypertension, COPD, hyperlipidemia, who is admitted to [facility] on 01/29/2023 for Peripheral edema [R60.9] Shock circulatory ([place]) [R57.9] Acute on chronic congestive heart failure, unspecified heart failure type ([place]) [I50.9] Acute renal failure superimposed on chronic kidney disease, unspecified acute renal failure type, unspecified CKD stage ([place]) [N17.9, N18.9]   #1: Acute kidney injury with chronic kidney disease stage IV. Acute kidney injury most likely secondary to cardiorenal syndrome  Developed hyperkalemia due to GI bleed and hypovolemic shock, was placed on CRRT on 02/22/23. CRRT terminated due to  multiple clotting events.  Received 2-3 hemodialysis treatments before recovery.  Renal function remained stable with adequate urine output. Patient will need to follow-up with our office at discharge.   #2 Acute respiratory failure requiring intubation: Extubated on 02/05/23.   Oxygen slightly increased to 3 L.   #3: Hypotension: Patient had cardiogenic shock requiring pressors.  Blood pressure remains acceptable for this patient  #4: Anemia with chronic kidney disease.  Hemoglobin just below target however acceptable.  Will monitor for now.    LOS: [facility] 3/18/202412:33 PM

## 2023-03-03 NOTE — Progress Notes (Signed)
Progress Note   Patient: John [name] [id number] DOB: 06/02/1946 DOA: 01/29/2023     33 DOS: the patient was seen and examined on 03/03/2023   Objective: Patient was seen and examined at bedside this morning Denies nausea vomiting or worsening respiratory function Currently awaiting skilled nursing facility placement According to case manager patient insurance have given prior authorization for skilled [facility] is hoping to take patient by tomorrow   Brief Narrative / Hospital Course:  77 year old male with a history of essential hypertension, on hydralazine and Cozaar, stage IV CKD but has not been on dialysis in the past, has chronic diastolic and systolic heart failure with cor pulmonale, moderate tricuspid regurgitation and aortic valve regurgitation, history of ascending aortic aneurysm, coronary artery disease with atherosclerosis, COPD and chronic hypoxemia baseline 2 L/min supplemental oxygen at home with recurrent episodes of hypoxemia and remote acute exacerbation of COPD with hypoxemia and hypercapnia 2 months ago history of ruptured left quadricep tendon 2 years ago, chronic thrombocytopenia and erythrocytosis recurrent lower extremity cellulitis, recurrent hyperkalemia, morbid obesity history of anasarca dyslipidemia, recent admission for acute CHF exacerbation requiring Lasix drip and BiPAP support, who was brought in by EMS due to worsening altered mental status and circulatory shock.  Patient was intubated and admitted to the ICU.  Was found to have worsening respiratory function requiring CRRT.  He was initially on vasopressor therapy which was weaned off.  Patient was intubated on account of worsening respiratory function but later got extubated. patient was deemed not to meet criteria for dialysis any longer and dialysis catheter was removed.  Patient was then being worked up for discharge to skilled nursing facility. In the process of waiting for skilled  nursing facility patient developed complicated hospital course with GI bleeding after he was found to have hematochezia and duodenal bleed on EGD.  Patient developed worsening renal function again and CRRT was reinitiated and patient was intubated and extubated thereafter.  He has been followed up by nephrologist and patient has elected not to undergo dialysis again and dialysis catheter was taken out.  Patient also elected to be DNR and did not want to be intubated again.  Was seen by physical therapy who has recommended skilled nursing facility placement.      Consultants:  Gastroenterology  Palliative Care  Cardiology Nephrology Vascular Surgery  ICU   Procedures: 02/14: intubation  CRRT 02/14-02/20 Emergent EGD 02/19/23 for duodenal bleed 01/29/23 Insertion of Non-tunneled Central Venous Catheter with US guidance  CRRT 03/07-03/09          ASSESSMENT & PLAN:   Principal Problem:   Shock circulatory ([place]) Active Problems:   Acute on chronic congestive heart failure ([place])   Acute hypoxic respiratory failure (HCC)   Toxic metabolic encephalopathy   Hypovolemic shock d/t ABLA - GI bleed Acute blood loss anemia on anemia of chronic disease  Trend CBC/HH and transfuse as needed  Protonix IV bid  Currently off vasopressor support No anticoags      Hyperkalemia-improved Continue to monitor BMP   Cardiogenic shock POA d/t HFpEF - improved but now hypovolemic shock d/t GI bleed and Acute decompensated HFpEF and right-sided heart failure RV dysfunction and dilation on echocardiography, concerning for RV failure and pulmonary hypertension. Porto-pulmonary hypertension on the differential given history of liver cirrhosis.  02/19/23 w/ GI bleed and hypovolemic shock he underwent aggressive fluid resuscitation and PRBC pending EGD, known risk w/ CHF and previous dialysis - monitor fluid status closely  CVVH for ultra-filtration  for volume removal --> stopped  Cardiology had  previously signed off, pt appears euvolemic  Nephrology is on board we appreciate input   Acute Hypoxic Respiratory Failure d/t HFpEF and hypovolemic shock Continue supplemental O2 as needed to maintain SpO2 >92% NO REINTUBATION if respiratory distress    Toxic Metabolic Encephalopathy - improved but waxes and wanes Patient's decision-making capacity to be limited but not absent. He is improving daily.       Acute kidney injury on chronic kidney disease stage IV CRRT d/c Nephrology following  Dialysis catheter removed 02/26/2023   Hepatic cirrhosis Thrombocytopenia.   Was on cefepime empirically given leukocytosis.  Cultures remain negative. Finished 5 days of antibiotics.  Monitor CBC, CMP     DVT prophylaxis: SCD Pertinent IV fluids/nutrition: diet as tolerated    Code Status: DNR   Current Admission Status: inpatient, med tele  TOC needs / Dispo plan: SNF Barriers to discharge / significant pending items:  Waiting skilled nursing facility placement Family Communication: none at this time      Physical Exam Constitutional:      General: He is not in acute distress.    Appearance: Not in acute distress on intranasal oxygen HENT:     Mouth/Throat:     Mouth: Mucous membranes are moist Cardiovascular:     Rate and Rhythm: Normal rate and regular rhythm.     Heart sounds: Normal heart sounds.  Pulmonary:     Effort: Pulmonary effort is normal. No respiratory distress.     Breath sounds: Normal breath sounds.  Abdominal:     Palpations: Abdomen is soft.  Musculoskeletal: Bilateral lower extremity pitting edema-improved, has stasis dermatitis       General: Skin is warm and dry.  Neurological:     General: No focal deficit present.     Mental Status: He is alert.       Physical Exam: Vitals:   03/02/23 2009 03/03/23 0445 03/03/23 0500 03/03/23 0810  BP: 110/77 117/83  131/76  Pulse: (!) 105 100  97  Resp: 20 18    Temp: 97.9 F (36.6 C) 98.1 F (36.7 C)     TempSrc:      SpO2: 96% 94%  94%  Weight:   84 kg   Height:        Author: Verline Lema, MD 03/03/2023 5:23 PM  For on call review [email or web address].

## 2023-03-04 DIAGNOSIS — N179 Acute kidney failure, unspecified: Secondary | ICD-10-CM | POA: Diagnosis not present

## 2023-03-04 DIAGNOSIS — J449 Chronic obstructive pulmonary disease, unspecified: Secondary | ICD-10-CM | POA: Diagnosis not present

## 2023-03-04 DIAGNOSIS — D62 Acute posthemorrhagic anemia: Secondary | ICD-10-CM | POA: Diagnosis not present

## 2023-03-04 DIAGNOSIS — D72828 Other elevated white blood cell count: Secondary | ICD-10-CM | POA: Diagnosis present

## 2023-03-04 DIAGNOSIS — Z66 Do not resuscitate: Secondary | ICD-10-CM | POA: Diagnosis not present

## 2023-03-04 DIAGNOSIS — S81802A Unspecified open wound, left lower leg, initial encounter: Secondary | ICD-10-CM | POA: Diagnosis present

## 2023-03-04 DIAGNOSIS — K269 Duodenal ulcer, unspecified as acute or chronic, without hemorrhage or perforation: Secondary | ICD-10-CM | POA: Diagnosis not present

## 2023-03-04 DIAGNOSIS — J810 Acute pulmonary edema: Secondary | ICD-10-CM | POA: Diagnosis present

## 2023-03-04 DIAGNOSIS — Z87891 Personal history of nicotine dependence: Secondary | ICD-10-CM | POA: Diagnosis not present

## 2023-03-04 DIAGNOSIS — Z7401 Bed confinement status: Secondary | ICD-10-CM | POA: Diagnosis not present

## 2023-03-04 DIAGNOSIS — J9601 Acute respiratory failure with hypoxia: Secondary | ICD-10-CM | POA: Diagnosis not present

## 2023-03-04 DIAGNOSIS — E1122 Type 2 diabetes mellitus with diabetic chronic kidney disease: Secondary | ICD-10-CM | POA: Diagnosis not present

## 2023-03-04 DIAGNOSIS — I509 Heart failure, unspecified: Secondary | ICD-10-CM | POA: Diagnosis not present

## 2023-03-04 DIAGNOSIS — I2510 Atherosclerotic heart disease of native coronary artery without angina pectoris: Secondary | ICD-10-CM | POA: Diagnosis present

## 2023-03-04 DIAGNOSIS — D539 Nutritional anemia, unspecified: Secondary | ICD-10-CM | POA: Diagnosis not present

## 2023-03-04 DIAGNOSIS — N184 Chronic kidney disease, stage 4 (severe): Secondary | ICD-10-CM | POA: Diagnosis not present

## 2023-03-04 DIAGNOSIS — E785 Hyperlipidemia, unspecified: Secondary | ICD-10-CM | POA: Diagnosis present

## 2023-03-04 DIAGNOSIS — I5023 Acute on chronic systolic (congestive) heart failure: Secondary | ICD-10-CM | POA: Diagnosis not present

## 2023-03-04 DIAGNOSIS — Z515 Encounter for palliative care: Secondary | ICD-10-CM | POA: Diagnosis not present

## 2023-03-04 DIAGNOSIS — R579 Shock, unspecified: Secondary | ICD-10-CM | POA: Diagnosis not present

## 2023-03-04 DIAGNOSIS — R571 Hypovolemic shock: Secondary | ICD-10-CM | POA: Diagnosis not present

## 2023-03-04 DIAGNOSIS — I110 Hypertensive heart disease with heart failure: Secondary | ICD-10-CM | POA: Diagnosis not present

## 2023-03-04 DIAGNOSIS — R0602 Shortness of breath: Secondary | ICD-10-CM | POA: Diagnosis not present

## 2023-03-04 DIAGNOSIS — I5033 Acute on chronic diastolic (congestive) heart failure: Secondary | ICD-10-CM | POA: Diagnosis not present

## 2023-03-04 DIAGNOSIS — Z79899 Other long term (current) drug therapy: Secondary | ICD-10-CM | POA: Diagnosis not present

## 2023-03-04 DIAGNOSIS — E875 Hyperkalemia: Secondary | ICD-10-CM | POA: Diagnosis present

## 2023-03-04 DIAGNOSIS — K267 Chronic duodenal ulcer without hemorrhage or perforation: Secondary | ICD-10-CM | POA: Diagnosis present

## 2023-03-04 DIAGNOSIS — Z833 Family history of diabetes mellitus: Secondary | ICD-10-CM | POA: Diagnosis not present

## 2023-03-04 DIAGNOSIS — J9621 Acute and chronic respiratory failure with hypoxia: Secondary | ICD-10-CM | POA: Diagnosis not present

## 2023-03-04 DIAGNOSIS — Z9981 Dependence on supplemental oxygen: Secondary | ICD-10-CM | POA: Diagnosis not present

## 2023-03-04 DIAGNOSIS — Z7189 Other specified counseling: Secondary | ICD-10-CM | POA: Diagnosis not present

## 2023-03-04 DIAGNOSIS — N189 Chronic kidney disease, unspecified: Secondary | ICD-10-CM | POA: Diagnosis not present

## 2023-03-04 DIAGNOSIS — X58XXXA Exposure to other specified factors, initial encounter: Secondary | ICD-10-CM | POA: Diagnosis present

## 2023-03-04 DIAGNOSIS — D631 Anemia in chronic kidney disease: Secondary | ICD-10-CM | POA: Diagnosis not present

## 2023-03-04 DIAGNOSIS — J9600 Acute respiratory failure, unspecified whether with hypoxia or hypercapnia: Secondary | ICD-10-CM | POA: Diagnosis not present

## 2023-03-04 DIAGNOSIS — I130 Hypertensive heart and chronic kidney disease with heart failure and stage 1 through stage 4 chronic kidney disease, or unspecified chronic kidney disease: Secondary | ICD-10-CM | POA: Diagnosis not present

## 2023-03-04 DIAGNOSIS — I5082 Biventricular heart failure: Secondary | ICD-10-CM | POA: Diagnosis present

## 2023-03-04 DIAGNOSIS — I5030 Unspecified diastolic (congestive) heart failure: Secondary | ICD-10-CM | POA: Diagnosis not present

## 2023-03-04 DIAGNOSIS — R570 Cardiogenic shock: Secondary | ICD-10-CM | POA: Diagnosis not present

## 2023-03-04 DIAGNOSIS — R0902 Hypoxemia: Secondary | ICD-10-CM | POA: Diagnosis not present

## 2023-03-04 DIAGNOSIS — G928 Other toxic encephalopathy: Secondary | ICD-10-CM | POA: Diagnosis not present

## 2023-03-04 LAB — CBC WITH DIFFERENTIAL/PLATELET
Abs Immature Granulocytes: 0.08 10*3/uL — ABNORMAL HIGH (ref 0.00–0.07)
Basophils Absolute: 0 10*3/uL (ref 0.0–0.1)
Basophils Relative: 0 %
Eosinophils Absolute: 0.1 10*3/uL (ref 0.0–0.5)
Eosinophils Relative: 1 %
HCT: 29.8 % — ABNORMAL LOW (ref 39.0–52.0)
Hemoglobin: 8.9 g/dL — ABNORMAL LOW (ref 13.0–17.0)
Immature Granulocytes: 1 %
Lymphocytes Relative: 7 %
Lymphs Abs: 0.8 10*3/uL (ref 0.7–4.0)
MCH: 30.4 pg (ref 26.0–34.0)
MCHC: 29.9 g/dL — ABNORMAL LOW (ref 30.0–36.0)
MCV: 101.7 fL — ABNORMAL HIGH (ref 80.0–100.0)
Monocytes Absolute: 0.8 10*3/uL (ref 0.1–1.0)
Monocytes Relative: 7 %
Neutro Abs: 9.9 10*3/uL — ABNORMAL HIGH (ref 1.7–7.7)
Neutrophils Relative %: 84 %
Platelets: 184 10*3/uL (ref 150–400)
RBC: 2.93 MIL/uL — ABNORMAL LOW (ref 4.22–5.81)
RDW: 19.4 % — ABNORMAL HIGH (ref 11.5–15.5)
WBC: 11.7 10*3/uL — ABNORMAL HIGH (ref 4.0–10.5)
nRBC: 0 % (ref 0.0–0.2)

## 2023-03-04 LAB — MAGNESIUM: Magnesium: 2 mg/dL (ref 1.7–2.4)

## 2023-03-04 LAB — RENAL FUNCTION PANEL
Albumin: 2.7 g/dL — ABNORMAL LOW (ref 3.5–5.0)
Anion gap: 9 (ref 5–15)
BUN: 32 mg/dL — ABNORMAL HIGH (ref 8–23)
CO2: 28 mmol/L (ref 22–32)
Calcium: 8.9 mg/dL (ref 8.9–10.3)
Chloride: 102 mmol/L (ref 98–111)
Creatinine, Ser: 2.21 mg/dL — ABNORMAL HIGH (ref 0.61–1.24)
GFR, Estimated: 30 mL/min — ABNORMAL LOW (ref >60)
Glucose, Bld: 107 mg/dL — ABNORMAL HIGH (ref 70–99)
Phosphorus: 4.3 mg/dL (ref 2.5–4.6)
Potassium: 4.9 mmol/L (ref 3.5–5.1)
Sodium: 139 mmol/L (ref 135–145)

## 2023-03-04 MED ORDER — ENSURE ENLIVE PO LIQD: 237.0000 mL | Freq: Three times a day (TID) | ORAL | 12 refills | Status: DC

## 2023-03-04 MED ORDER — RENA-VITE PO TABS: 1.0000 | ORAL_TABLET | Freq: Every day | ORAL | 0 refills | Status: DC

## 2023-03-04 MED ORDER — PANTOPRAZOLE SODIUM 40 MG PO TBEC: 40.0000 mg | DELAYED_RELEASE_TABLET | Freq: Two times a day (BID) | ORAL | 2 refills | Status: DC

## 2023-03-04 MED ORDER — ASCORBIC ACID 500 MG PO TABS: 500.0000 mg | ORAL_TABLET | Freq: Two times a day (BID) | ORAL | 0 refills | Status: DC

## 2023-03-04 NOTE — TOC Transition Note (Signed)
Transition of Care [facility]) - CM/SW Discharge Note   Patient Details  Name: John [name] MRN: [id number] Date of Birth: [birth year]  [facility] CM/SW Contact:  Beverly Sessions, RN Phone Number: 03/04/2023, 11:56 AM   Clinical Narrative:     Patient will DC to:[facility] Anticipated DC date: target organ damage  Family notified: VM left for [facility] [id number]  Per MD patient ready for DC to . RN, patient, and facility notified of DC. Discharge Summary sent to facility. RN given number for report. DC packet on chart. Ambulance transport requested for patient.  TOC signing off.  Isaias Cowman [facility] [phone]     Barriers to Discharge: Continued Medical Work up   Patient Goals and CMS Choice      Discharge Placement                         Discharge Plan and Services Additional resources added to the After Visit Summary for     Discharge Planning Services: CM Consult            DME Arranged: N/A                    Social Determinants of Health (SDOH) Interventions SDOH Screenings   Food Insecurity: No Food Insecurity (01/29/2023)  Housing: Low Risk  (01/29/2023)  Transportation Needs: No Transportation Needs (01/29/2023)  Utilities: Not At Risk (01/29/2023)  Alcohol Screen: Low Risk  (07/17/2022)  Depression (PHQ2-9): Low Risk  (07/30/2022)  Financial Resource Strain: Low Risk  (07/17/2022)  Physical Activity: Sufficiently Active (07/17/2022)  Social Connections: Socially Integrated (07/17/2022)  Stress: No Stress Concern Present (07/17/2022)  Tobacco Use: Medium Risk (02/20/2023)     Readmission Risk Interventions    01/30/2023   11:09 AM  Readmission Risk Prevention Plan  Transportation Screening Complete  Medication Review ([place]) Complete  PCP or Specialist appointment within 3-5 days of discharge Complete  HRI or [place] Complete  SW Recovery Care/Counseling Consult Complete  Palliative Care  Screening Complete  [place] Not Applicable

## 2023-03-04 NOTE — Progress Notes (Signed)
Mobility Specialist - Progress Note   03/04/23 1500  Mobility  Activity Ambulated with assistance in room;Stood at bedside  Level of Assistance Standby assist, set-up cues, supervision of patient - no hands on  Assistive Device Front wheel walker  Activity Response Tolerated well  $Mobility charge 1 Mobility   Pt sitting in recliner upon entry, utilizing RA. Pt STS to RW SBA, tolerated well. Pt amb around the bed to collect a few personal items and returned seated. Pt left sitting in recliner, with needs within reach and family member present at bedside.   [name] Mobility Specialist 03/04/23 3:11 PM

## 2023-03-04 NOTE — Discharge Summary (Signed)
Physician Discharge Summary   Patient: John [name] MRN: [id number] DOB: [birth year]  Admit date:     01/29/2023  Discharge date: 03/04/23  Discharge Physician: Verline Lema   PCP: Alisa Graff, FNP     Discharge Diagnoses: Hypovolemic shock d/t ABLA - GI bleed Acute blood loss anemia on anemia of chronic disease  Hyperkalemia-improved Cardiogenic shock POA d/t HFpEF - improved but now hypovolemic shock d/t GI bleed and Acute decompensated HFpEF and right-sided heart failure-improved Acute Hypoxic Respiratory Failure d/t HFpEF and hypovolemic shock Toxic Metabolic Encephalopathy - improved but waxes and wanes Acute kidney injury on chronic kidney disease stage IV Hepatic cirrhosis Thrombocytopenia.     Hospital Course: 77 year old male with a history of essential hypertension, on hydralazine and Cozaar, stage IV CKD but has not been on dialysis in the past, has chronic diastolic and systolic heart failure with cor pulmonale, moderate tricuspid regurgitation and aortic valve regurgitation, history of ascending aortic aneurysm, coronary artery disease with atherosclerosis, COPD and chronic hypoxemia baseline 2 L/min supplemental oxygen at home with recurrent episodes of hypoxemia and remote acute exacerbation of COPD with hypoxemia and hypercapnia 2 months ago history of ruptured left quadricep tendon 2 years ago, chronic thrombocytopenia and erythrocytosis recurrent lower extremity cellulitis, recurrent hyperkalemia, morbid obesity history of anasarca dyslipidemia, recent admission for acute CHF exacerbation requiring Lasix drip and BiPAP support, who was brought in by EMS due to worsening altered mental status and circulatory shock.  Patient was intubated and admitted to the ICU.  Was found to have worsening respiratory function requiring CRRT.  He was initially on vasopressor therapy which was weaned off.  Patient was intubated on account of worsening respiratory function but later got  extubated. patient was deemed not to meet criteria for dialysis any longer and dialysis catheter was removed.  Patient was then being worked up for discharge to skilled nursing facility. In the process of waiting for skilled nursing facility patient developed complicated hospital course with GI bleeding after he was found to have hematochezia and duodenal bleed on EGD.  Patient developed worsening renal function again and CRRT was reinitiated and patient was intubated and extubated thereafter.  He has been followed up by nephrologist and patient has elected not to undergo dialysis again and dialysis catheter was taken out.  Patient also elected to be DNR and did not want to be intubated again.  Was seen by physical therapy who has recommended skilled nursing facility placement.  Patient has also been cleared by nephrologist for discharge today and to follow-up with nephrologist as an outpatient.   Consultants: Nephrology Procedures performed: CRRT Disposition: Skilled nursing facility Diet recommendation:  Renal diet DISCHARGE MEDICATION: Allergies as of 03/04/2023   No Known Allergies      Medication List     STOP taking these medications    aspirin EC 81 MG tablet   cephALEXin 500 MG capsule Commonly known as: KEFLEX   gabapentin 100 MG capsule Commonly known as: NEURONTIN   losartan 50 MG tablet Commonly known as: COZAAR   metolazone 2.5 MG tablet Commonly known as: ZAROXOLYN   midodrine 2.5 MG tablet Commonly known as: PROAMATINE   omeprazole 20 MG capsule Commonly known as: PRILOSEC   potassium chloride SA 20 MEQ tablet Commonly known as: KLOR-CON M   Torsemide 60 MG Tabs       TAKE these medications    ascorbic acid 500 MG tablet Commonly known as: VITAMIN C Take 1 tablet (500 mg total) by  mouth 2 (two) times daily.   calcitRIOL 0.25 MCG capsule Commonly known as: ROCALTROL Take 0.25 mcg by mouth daily.   feeding supplement Liqd Take 237 mLs by mouth 3  (three) times daily between meals.   ipratropium-albuterol 0.5-2.5 (3) MG/3ML Soln Commonly known as: DUONEB Inhale 3 mLs into the lungs every 6 (six) hours as needed (wheezing).   multivitamin Tabs tablet Take 1 tablet by mouth at bedtime.   pantoprazole 40 MG tablet Commonly known as: PROTONIX Take 1 tablet (40 mg total) by mouth 2 (two) times daily.        Contact information for after-discharge care     Destination     [facility] .   Service: Skilled Nursing Contact information: [address] [place] [phone]                    Discharge Exam: Danley Danker Weights   03/01/23 0500 03/02/23 0443 03/03/23 0500  Weight: 83.4 kg 85.5 kg 84 kg   Constitutional:      General: He is not in acute distress.    Appearance: Not in acute distress on intranasal oxygen HENT:     Mouth/Throat:     Mouth: Mucous membranes are moist Cardiovascular:     Rate and Rhythm: Normal rate and regular rhythm.     Heart sounds: Normal heart sounds.  Pulmonary:     Effort: Pulmonary effort is normal. No respiratory distress.     Breath sounds: Normal breath sounds.  Abdominal:     Palpations: Abdomen is soft.  Musculoskeletal: Bilateral lower extremity pitting edema-improved,      General: Skin is warm and dry.  Neurological:     General: No focal deficit present.     Mental Status: He is alert.   Condition at discharge: stable Discharge time spent: greater than 30 minutes.  Signed: Verline Lema, MD [facility] 03/04/2023

## 2023-03-04 NOTE — Progress Notes (Signed)
Report given to [facility] LPN at [facility]. All questions answered.

## 2023-03-04 NOTE — TOC Progression Note (Signed)
Transition of Care [facility]) - Progression Note    Patient Details  Name: John [name] MRN: [id number] Date of Birth: 1946/12/08  Transition of Care [facility]) CM/SW Contact  Beverly Sessions, RN Phone Number: 03/04/2023, 10:59 AM  Clinical Narrative:     Message sent to MD to determine if patient is medically ready for discharge   Expected Discharge Plan:  (TBD) Barriers to Discharge: Continued Medical Work up  Expected Discharge Plan and Services   Discharge Planning Services: CM Consult   Living arrangements for the past 2 months: Single Family Home                 DME Arranged: N/A                     Social Determinants of Health (SDOH) Interventions SDOH Screenings   Food Insecurity: No Food Insecurity (01/29/2023)  Housing: Low Risk  (01/29/2023)  Transportation Needs: No Transportation Needs (01/29/2023)  Utilities: Not At Risk (01/29/2023)  Alcohol Screen: Low Risk  (07/17/2022)  Depression (PHQ2-9): Low Risk  (07/30/2022)  Financial Resource Strain: Low Risk  (07/17/2022)  Physical Activity: Sufficiently Active (07/17/2022)  Social Connections: Socially Integrated (07/17/2022)  Stress: No Stress Concern Present (07/17/2022)  Tobacco Use: Medium Risk (02/20/2023)    Readmission Risk Interventions    01/30/2023   11:09 AM  Readmission Risk Prevention Plan  Transportation Screening Complete  Medication Review ([place]) Complete  PCP or Specialist appointment within 3-5 days of discharge Complete  HRI or [place] Complete  SW Recovery Care/Counseling Consult Complete  [place] Not Applicable

## 2023-03-04 NOTE — Care Management Important Message (Signed)
Important Message  Patient Details  Name: John [name] MRN: [id number] Date of Birth: 1946/05/29   Medicare Important Message Given:  Other (see comment)  Patient asleep upon time of visit.  Unable to review Medicare IM at this time.   [name] 03/04/2023, 11:04 AM

## 2023-03-05 ENCOUNTER — Emergency Department: Payer: Medicare HMO

## 2023-03-05 ENCOUNTER — Inpatient Hospital Stay
Admission: EM | Admit: 2023-03-05 | Discharge: 2023-03-17 | DRG: 291 | Disposition: E | Payer: Medicare HMO | Source: Skilled Nursing Facility | Attending: Internal Medicine | Admitting: Internal Medicine

## 2023-03-05 ENCOUNTER — Other Ambulatory Visit: Payer: Self-pay

## 2023-03-05 DIAGNOSIS — R0602 Shortness of breath: Secondary | ICD-10-CM | POA: Diagnosis not present

## 2023-03-05 DIAGNOSIS — X58XXXA Exposure to other specified factors, initial encounter: Secondary | ICD-10-CM | POA: Diagnosis present

## 2023-03-05 DIAGNOSIS — K269 Duodenal ulcer, unspecified as acute or chronic, without hemorrhage or perforation: Secondary | ICD-10-CM | POA: Diagnosis not present

## 2023-03-05 DIAGNOSIS — D631 Anemia in chronic kidney disease: Secondary | ICD-10-CM | POA: Diagnosis not present

## 2023-03-05 DIAGNOSIS — J9621 Acute and chronic respiratory failure with hypoxia: Secondary | ICD-10-CM | POA: Diagnosis not present

## 2023-03-05 DIAGNOSIS — Z9981 Dependence on supplemental oxygen: Secondary | ICD-10-CM

## 2023-03-05 DIAGNOSIS — Z833 Family history of diabetes mellitus: Secondary | ICD-10-CM

## 2023-03-05 DIAGNOSIS — J810 Acute pulmonary edema: Secondary | ICD-10-CM

## 2023-03-05 DIAGNOSIS — G928 Other toxic encephalopathy: Secondary | ICD-10-CM | POA: Diagnosis not present

## 2023-03-05 DIAGNOSIS — I110 Hypertensive heart disease with heart failure: Secondary | ICD-10-CM | POA: Diagnosis not present

## 2023-03-05 DIAGNOSIS — R0902 Hypoxemia: Secondary | ICD-10-CM | POA: Diagnosis not present

## 2023-03-05 DIAGNOSIS — J449 Chronic obstructive pulmonary disease, unspecified: Secondary | ICD-10-CM | POA: Diagnosis present

## 2023-03-05 DIAGNOSIS — N179 Acute kidney failure, unspecified: Secondary | ICD-10-CM | POA: Diagnosis not present

## 2023-03-05 DIAGNOSIS — I509 Heart failure, unspecified: Secondary | ICD-10-CM | POA: Diagnosis not present

## 2023-03-05 DIAGNOSIS — R571 Hypovolemic shock: Secondary | ICD-10-CM | POA: Diagnosis present

## 2023-03-05 DIAGNOSIS — R0600 Dyspnea, unspecified: Secondary | ICD-10-CM | POA: Diagnosis not present

## 2023-03-05 DIAGNOSIS — I5030 Unspecified diastolic (congestive) heart failure: Secondary | ICD-10-CM | POA: Diagnosis not present

## 2023-03-05 DIAGNOSIS — J95859 Other complication of respirator [ventilator]: Secondary | ICD-10-CM | POA: Diagnosis not present

## 2023-03-05 DIAGNOSIS — I2510 Atherosclerotic heart disease of native coronary artery without angina pectoris: Secondary | ICD-10-CM | POA: Diagnosis present

## 2023-03-05 DIAGNOSIS — Z79899 Other long term (current) drug therapy: Secondary | ICD-10-CM

## 2023-03-05 DIAGNOSIS — Z66 Do not resuscitate: Secondary | ICD-10-CM | POA: Diagnosis present

## 2023-03-05 DIAGNOSIS — S81802A Unspecified open wound, left lower leg, initial encounter: Secondary | ICD-10-CM | POA: Diagnosis present

## 2023-03-05 DIAGNOSIS — N189 Chronic kidney disease, unspecified: Secondary | ICD-10-CM | POA: Diagnosis not present

## 2023-03-05 DIAGNOSIS — T148XXA Other injury of unspecified body region, initial encounter: Secondary | ICD-10-CM | POA: Diagnosis present

## 2023-03-05 DIAGNOSIS — D539 Nutritional anemia, unspecified: Secondary | ICD-10-CM | POA: Diagnosis present

## 2023-03-05 DIAGNOSIS — I259 Chronic ischemic heart disease, unspecified: Secondary | ICD-10-CM | POA: Diagnosis not present

## 2023-03-05 DIAGNOSIS — I130 Hypertensive heart and chronic kidney disease with heart failure and stage 1 through stage 4 chronic kidney disease, or unspecified chronic kidney disease: Secondary | ICD-10-CM | POA: Diagnosis not present

## 2023-03-05 DIAGNOSIS — I10 Essential (primary) hypertension: Secondary | ICD-10-CM | POA: Diagnosis not present

## 2023-03-05 DIAGNOSIS — R570 Cardiogenic shock: Secondary | ICD-10-CM | POA: Diagnosis not present

## 2023-03-05 DIAGNOSIS — E875 Hyperkalemia: Secondary | ICD-10-CM | POA: Diagnosis not present

## 2023-03-05 DIAGNOSIS — Z87891 Personal history of nicotine dependence: Secondary | ICD-10-CM

## 2023-03-05 DIAGNOSIS — I5033 Acute on chronic diastolic (congestive) heart failure: Secondary | ICD-10-CM | POA: Diagnosis not present

## 2023-03-05 DIAGNOSIS — E1122 Type 2 diabetes mellitus with diabetic chronic kidney disease: Secondary | ICD-10-CM | POA: Diagnosis present

## 2023-03-05 DIAGNOSIS — N183 Chronic kidney disease, stage 3 unspecified: Secondary | ICD-10-CM | POA: Diagnosis not present

## 2023-03-05 DIAGNOSIS — I959 Hypotension, unspecified: Secondary | ICD-10-CM | POA: Diagnosis not present

## 2023-03-05 DIAGNOSIS — N1832 Chronic kidney disease, stage 3b: Secondary | ICD-10-CM | POA: Diagnosis not present

## 2023-03-05 DIAGNOSIS — Z7189 Other specified counseling: Secondary | ICD-10-CM | POA: Diagnosis not present

## 2023-03-05 DIAGNOSIS — D62 Acute posthemorrhagic anemia: Secondary | ICD-10-CM | POA: Diagnosis not present

## 2023-03-05 DIAGNOSIS — J9811 Atelectasis: Secondary | ICD-10-CM | POA: Diagnosis present

## 2023-03-05 DIAGNOSIS — K267 Chronic duodenal ulcer without hemorrhage or perforation: Secondary | ICD-10-CM | POA: Diagnosis present

## 2023-03-05 DIAGNOSIS — D72828 Other elevated white blood cell count: Secondary | ICD-10-CM | POA: Diagnosis present

## 2023-03-05 DIAGNOSIS — Z515 Encounter for palliative care: Secondary | ICD-10-CM

## 2023-03-05 DIAGNOSIS — N184 Chronic kidney disease, stage 4 (severe): Secondary | ICD-10-CM | POA: Diagnosis present

## 2023-03-05 DIAGNOSIS — I5082 Biventricular heart failure: Secondary | ICD-10-CM | POA: Diagnosis present

## 2023-03-05 DIAGNOSIS — E785 Hyperlipidemia, unspecified: Secondary | ICD-10-CM | POA: Diagnosis present

## 2023-03-05 DIAGNOSIS — J90 Pleural effusion, not elsewhere classified: Secondary | ICD-10-CM | POA: Diagnosis not present

## 2023-03-05 DIAGNOSIS — J9600 Acute respiratory failure, unspecified whether with hypoxia or hypercapnia: Secondary | ICD-10-CM | POA: Diagnosis not present

## 2023-03-05 LAB — BASIC METABOLIC PANEL
Anion gap: 8 (ref 5–15)
BUN: 32 mg/dL — ABNORMAL HIGH (ref 8–23)
CO2: 29 mmol/L (ref 22–32)
Calcium: 9.2 mg/dL (ref 8.9–10.3)
Chloride: 101 mmol/L (ref 98–111)
Creatinine, Ser: 2.13 mg/dL — ABNORMAL HIGH (ref 0.61–1.24)
GFR, Estimated: 31 mL/min — ABNORMAL LOW (ref >60)
Glucose, Bld: 101 mg/dL — ABNORMAL HIGH (ref 70–99)
Potassium: 5 mmol/L (ref 3.5–5.1)
Sodium: 138 mmol/L (ref 135–145)

## 2023-03-05 LAB — CBC
HCT: 29.6 % — ABNORMAL LOW (ref 39.0–52.0)
Hemoglobin: 8.9 g/dL — ABNORMAL LOW (ref 13.0–17.0)
MCH: 30.9 pg (ref 26.0–34.0)
MCHC: 30.1 g/dL (ref 30.0–36.0)
MCV: 102.8 fL — ABNORMAL HIGH (ref 80.0–100.0)
Platelets: 196 10*3/uL (ref 150–400)
RBC: 2.88 MIL/uL — ABNORMAL LOW (ref 4.22–5.81)
RDW: 19.8 % — ABNORMAL HIGH (ref 11.5–15.5)
WBC: 11 10*3/uL — ABNORMAL HIGH (ref 4.0–10.5)
nRBC: 0 % (ref 0.0–0.2)

## 2023-03-05 LAB — BRAIN NATRIURETIC PEPTIDE: B Natriuretic Peptide: 2659.5 pg/mL — ABNORMAL HIGH (ref 0.0–100.0)

## 2023-03-05 MED ORDER — DM-GUAIFENESIN ER 30-600 MG PO TB12: 1.0000 | ORAL_TABLET | Freq: Two times a day (BID) | ORAL | Status: DC | PRN

## 2023-03-05 MED ORDER — OXYCODONE-ACETAMINOPHEN 5-325 MG PO TABS
1.0000 | ORAL_TABLET | ORAL | Status: DC | PRN
  Administered 2023-03-05 – 2023-03-11 (×9): 1 via ORAL
  Filled 2023-03-05 (×9): qty 1

## 2023-03-05 MED ORDER — FUROSEMIDE 10 MG/ML IJ SOLN
40.0000 mg | Freq: Two times a day (BID) | INTRAMUSCULAR | Status: DC
  Administered 2023-03-06 – 2023-03-08 (×5): 40 mg via INTRAVENOUS
  Filled 2023-03-05 (×5): qty 4

## 2023-03-05 MED ORDER — ONDANSETRON HCL 4 MG/2ML IJ SOLN: 4.0000 mg | Freq: Three times a day (TID) | INTRAMUSCULAR | Status: DC | PRN

## 2023-03-05 MED ORDER — HYDRALAZINE HCL 20 MG/ML IJ SOLN: 5.0000 mg | INTRAMUSCULAR | Status: DC | PRN

## 2023-03-05 MED ORDER — PANTOPRAZOLE SODIUM 40 MG PO TBEC
40.0000 mg | DELAYED_RELEASE_TABLET | Freq: Two times a day (BID) | ORAL | Status: DC
  Administered 2023-03-05 – 2023-03-15 (×21): 40 mg via ORAL
  Filled 2023-03-05 (×22): qty 1

## 2023-03-05 MED ORDER — ENSURE ENLIVE PO LIQD
237.0000 mL | Freq: Three times a day (TID) | ORAL | Status: DC
  Administered 2023-03-05 – 2023-03-15 (×23): 237 mL via ORAL

## 2023-03-05 MED ORDER — BACITRACIN ZINC 500 UNIT/GM EX OINT
TOPICAL_OINTMENT | Freq: Two times a day (BID) | CUTANEOUS | Status: DC
  Administered 2023-03-05: 1 via TOPICAL
  Filled 2023-03-05 (×5): qty 0.9

## 2023-03-05 MED ORDER — ALBUTEROL SULFATE (2.5 MG/3ML) 0.083% IN NEBU: 2.5000 mg | INHALATION_SOLUTION | RESPIRATORY_TRACT | Status: DC | PRN

## 2023-03-05 MED ORDER — RENA-VITE PO TABS
1.0000 | ORAL_TABLET | Freq: Every day | ORAL | Status: DC
  Administered 2023-03-06 – 2023-03-15 (×10): 1 via ORAL
  Filled 2023-03-05 (×11): qty 1

## 2023-03-05 MED ORDER — FUROSEMIDE 10 MG/ML IJ SOLN
40.0000 mg | Freq: Once | INTRAMUSCULAR | Status: AC
  Administered 2023-03-05: 40 mg via INTRAVENOUS
  Filled 2023-03-05: qty 4

## 2023-03-05 MED ORDER — ACETAMINOPHEN 325 MG PO TABS
650.0000 mg | ORAL_TABLET | Freq: Four times a day (QID) | ORAL | Status: DC | PRN
  Administered 2023-03-05 – 2023-03-13 (×2): 650 mg via ORAL
  Filled 2023-03-05 (×2): qty 2

## 2023-03-05 MED ORDER — VITAMIN C 500 MG PO TABS
500.0000 mg | ORAL_TABLET | Freq: Two times a day (BID) | ORAL | Status: DC
  Administered 2023-03-05 – 2023-03-15 (×21): 500 mg via ORAL
  Filled 2023-03-05 (×22): qty 1

## 2023-03-05 MED ORDER — CEPHALEXIN 500 MG PO CAPS
500.0000 mg | ORAL_CAPSULE | Freq: Two times a day (BID) | ORAL | Status: DC
  Administered 2023-03-05 – 2023-03-06 (×2): 500 mg via ORAL
  Filled 2023-03-05 (×2): qty 1

## 2023-03-05 MED ORDER — CALCITRIOL 0.25 MCG PO CAPS
0.2500 ug | ORAL_CAPSULE | Freq: Every day | ORAL | Status: DC
  Administered 2023-03-06 – 2023-03-15 (×10): 0.25 ug via ORAL
  Filled 2023-03-05 (×11): qty 1

## 2023-03-05 NOTE — ED Triage Notes (Addendum)
Pt to ED from [facility] via [facility]. Pt was sent out by staff due to low oxygen. EMS found him to be 97 % on his baseline 3 liters.  Pt states he is scheduled for new dialysis. Staff at facility was unaware he is a dialysis patient. Pt doesn't have an obvious dialysis port that I can see. Pt was [facility] yesterday. So staff was poor historians per EMS> Pt is CAOx4 and in no acute distress. Pt  has no current complaints at this time.

## 2023-03-05 NOTE — ED Notes (Signed)
[facility] did not transfer call to [employer]. Will try again.

## 2023-03-05 NOTE — ED Notes (Signed)
Changed to ESI 3 due to resources used.

## 2023-03-05 NOTE — ED Notes (Signed)
SPO2 dipped into low 80s (82%). Sat bed up higher and turned oxygen to 6L. Informed EDP. Pt has family member at bedside now.

## 2023-03-05 NOTE — ED Notes (Signed)
This RN thought pt had already been taken to room 36. Hospitalist was at bedside and now pt will go to room 36.

## 2023-03-05 NOTE — ED Provider Notes (Signed)
[facility] Provider Note    Event Date/Time   First MD Initiated Contact with Patient 03/05/23 1147     (approximate)   History   Follow-up (Pt has no complaints)   HPI  John [name] is a 77 y.o. male past medical history significant for hypertension, CKD, heart failure, ascending aortic aneurysm, CAD, COPD with chronic hypoxia with 2 L at baseline, who presents to the emergency department with increased work of breathing and concern for volume overload.  Patient had a recent prolonged hospitalization and was discharged to [facility] facility yesterday.  Today they noted increased work of breathing and signs of volume overload.  States that he was hypoxic on 3 L nasal cannula to 79%.  Patient denies any complaints at this time.  He is not sure why he is here.  He does not recall most of his medical history.  States that he was just discharged yesterday.     Physical Exam   Triage Vital Signs: ED Triage Vitals [03/05/23 1121]  Enc Vitals Group     BP 113/79     Pulse Rate 96     Resp 16     Temp 98 F (36.7 C)     Temp Source Oral     SpO2 98 %     Weight 185 lb 3 oz (84 kg)     Height 5\' 11"  (1.803 m)     Head Circumference      Peak Flow      Pain Score 0     Pain Loc      Pain Edu?      Excl. in [place]?     Most recent vital signs: Vitals:   03/05/23 1400 03/05/23 1519  BP: 110/78 119/85  Pulse: (!) 102 92  Resp: 18 20  Temp:  98 F (36.7 C)  SpO2: 90% 97%    Physical Exam Constitutional:      Appearance: He is well-developed.  HENT:     Head: Atraumatic.  Eyes:     Conjunctiva/sclera: Conjunctivae normal.     Pupils: Pupils are equal, round, and reactive to light.  Cardiovascular:     Rate and Rhythm: Regular rhythm.  Pulmonary:     Effort: Respiratory distress present.     Breath sounds: Rales present.     Comments: Hypoxic to 82% on 3 L nasal cannula, increased to 6 L and improved to 97%.  Tachypneic.  Crackles to  lower lung fields. Musculoskeletal:     Cervical back: Normal range of motion.     Right lower leg: Edema present.     Left lower leg: Edema present.     Comments: Bilateral upper extremity pitting edema  Skin:    General: Skin is warm.  Neurological:     Mental Status: He is alert. Mental status is at baseline.  Psychiatric:        Mood and Affect: Mood normal.      IMPRESSION / MDM / [place] / ED COURSE  I reviewed the triage vital signs and the nursing notes.  On chart review patient had a prolonged hospitalization including need of ICU, was intubated twice and needed CRRT.  Ultimately the patient did not want any further dialysis and his PermCath was removed.  He was made DNR.  Discharged but not on any Lasix.  Patient was previously on midodrine but I do not see that he is on midodrine at this time.  RADIOLOGY I independently reviewed imaging, my interpretation of imaging: Chest x-ray with signs of pleural effusion.  EKG showed normal sinus rhythm.  Low voltage.  No significant ST elevation or depression.  No signs of acute ischemia or dysrhythmia.   Labs (all labs ordered are listed, but only abnormal results are displayed) Labs interpreted as -    Labs Reviewed  CBC - Abnormal; Notable for the following components:      Result Value   WBC 11.0 (*)    RBC 2.88 (*)    Hemoglobin 8.9 (*)    HCT 29.6 (*)    MCV 102.8 (*)    RDW 19.8 (*)    All other components within normal limits  BASIC METABOLIC PANEL - Abnormal; Notable for the following components:   Glucose, Bld 101 (*)    BUN 32 (*)    Creatinine, Ser 2.13 (*)    GFR, Estimated 31 (*)    All other components within normal limits  BRAIN NATRIURETIC PEPTIDE - Abnormal; Notable for the following components:   B Natriuretic Peptide 2,659.5 (*)    All other components within normal limits      Patient with significantly elevated BNP.  Elevated troponin but likely in the setting of CKD.  No  active chest pain at this time.  Patient with progressively worsening hypoxia that is worse from his discharge hypoxia on 3 L.  Appears to be volume overloaded.  Do not see that the patient was discharged on any Lasix.  Given IV Lasix in the emergency department.  Consulted hospitalist for admission for acute on chronic hypoxic respiratory failure in the setting of volume overload.   PROCEDURES:  Critical Care performed: yes  .Critical Care  Performed by: Nathaniel Man, MD Authorized by: Nathaniel Man, MD   Critical care provider statement:    Critical care time (minutes):  30   Critical care was necessary to treat or prevent imminent or life-threatening deterioration of the following conditions:  Respiratory failure   Critical care was time spent personally by me on the following activities:  Development of treatment plan with patient or surrogate, discussions with consultants, evaluation of patient's response to treatment, examination of patient, ordering and review of laboratory studies, ordering and review of radiographic studies, ordering and performing treatments and interventions, pulse oximetry, re-evaluation of patient's condition and review of old charts   Patient's presentation is most consistent with acute presentation with potential threat to life or bodily function.   MEDICATIONS ORDERED IN ED: Medications  albuterol (PROVENTIL) (2.5 MG/3ML) 0.083% nebulizer solution 2.5 mg (has no administration in time range)  dextromethorphan-guaiFENesin (MUCINEX DM) 30-600 MG per 12 hr tablet 1 tablet (has no administration in time range)  ondansetron (ZOFRAN) injection 4 mg (has no administration in time range)  hydrALAZINE (APRESOLINE) injection 5 mg (has no administration in time range)  acetaminophen (TYLENOL) tablet 650 mg (650 mg Oral Given 03/05/23 1528)  calcitRIOL (ROCALTROL) capsule 0.25 mcg (has no administration in time range)  pantoprazole (PROTONIX) EC tablet 40 mg (has no  administration in time range)  ascorbic acid (VITAMIN C) tablet 500 mg (has no administration in time range)  feeding supplement (ENSURE ENLIVE / ENSURE PLUS) liquid 237 mL (has no administration in time range)  multivitamin (RENA-VIT) tablet 1 tablet (has no administration in time range)  furosemide (LASIX) injection 40 mg (40 mg Intravenous Given 03/05/23 1429)    FINAL CLINICAL IMPRESSION(S) / ED DIAGNOSES   Final diagnoses:  Acute on chronic  respiratory failure with hypoxia (HCC)  Acute pulmonary edema (HCC)  Acute on chronic congestive heart failure, unspecified heart failure type ([place])     Rx / DC Orders   ED Discharge Orders     None        Note:  This document was prepared using Dragon voice recognition software and may include unintentional dictation errors.   Nathaniel Man, MD 03/05/23 1622

## 2023-03-05 NOTE — ED Notes (Signed)
Trying to call [facility] at [phone] to get more information per provider request. On hold.

## 2023-03-05 NOTE — ED Notes (Signed)
Spoke with nurse at SNF. Reason pt was sent here was because provider thought he was fluid overloaded and having labored breathing. They had checked his SPO2 and it was in 80s.

## 2023-03-05 NOTE — H&P (Signed)
History and Physical    Bland Rudzinski CHY:[id number] DOB: 03-13-1946 DOA: 03/05/2023  Referring MD/NP/PA:   PCP: Alisa Graff, FNP   Patient coming from:  The patient is coming from SNF Chief Complaint:   HPI: John [name] is a 77 y.o. male with medical history significant of hypertension, hyperlipidemia, COPD on 3 L oxygen, diastolic CHF CKD stage IV, duodenal ulcer with bleeding, recent admission due to hypovolemic and cardiogenic shock, who presents with shortness of breath.  Patient was recently hospitalized from 2/14 - 3/19 due to hypovolemic and cardiogenic shock in the setting of duodenal ulcer bleeding. His losartan, metolazone and torsemide were on hold at discharge. Pt states that he developed shortness of breath since yesterday, which has been progressively worsening.  Patient has cough with white and yellow-colored sputum production.  No chest pain, fever or chills.  He was found to have oxygen desaturation to 70-80s on home level 3 L oxygen, which improved to 98% on 6 L oxygen in ED. Patient does not have abdominal pain, diarrhea, nausea or vomiting.  No symptoms of UTI.  He has a wound in the posterior left lower leg, causing severe pain.  Data reviewed independently and ED Course: pt was found to have BNP 2659, WBC 11.0, renal function close to recent baseline, temperature normal, blood pressure 104/82, heart rate 101, 87, RR 22.  Chest x-ray showed mild right basilar atelectasis with effusion.  Patient is admitted to PCU as inpatient.   EKG: I have personally reviewed.  Sinus rhythm, QTc 401, low voltage, early R wave progression.   Review of Systems:   General: no fevers, chills, no body weight gain, has fatigue HEENT: no blurry vision, hearing changes or sore throat Respiratory: has dyspnea, coughing, no wheezing CV: no chest pain, no palpitations GI: no nausea, vomiting, abdominal pain, diarrhea, constipation GU: no dysuria, burning on urination, increased  urinary frequency, hematuria  Ext: has leg edema Neuro: no unilateral weakness, numbness, or tingling, no vision change or hearing loss Skin: has open wound in left lower leg MSK: No muscle spasm, no deformity, no limitation of range of movement in spin Heme: No easy bruising.  Travel history: No recent long distant travel.   Allergy: No Known Allergies  Past Medical History:  Diagnosis Date   CHF (congestive heart failure) (HCC)    EF 45% in 2021   Chronic kidney disease    COPD (chronic obstructive pulmonary disease) ([place]) 02/16/2020   Hyperlipidemia    Hypertension    Pneumonia 2021    Past Surgical History:  Procedure Laterality Date   CATARACT EXTRACTION W/PHACO Left 10/31/2021   Procedure: CATARACT EXTRACTION PHACO AND INTRAOCULAR LENS PLACEMENT ([place]) LEFT VISION BLUE 3.45 00:48.0;  Surgeon: Leandrew Koyanagi, MD;  Location: [facility];  Service: Ophthalmology;  Laterality: Left;   CATARACT EXTRACTION W/PHACO Right 11/14/2021   Procedure: CATARACT EXTRACTION PHACO AND INTRAOCULAR LENS PLACEMENT (IOC) RIGHT 8.59 01:10.5;  Surgeon: Leandrew Koyanagi, MD;  Location: [facility];  Service: Ophthalmology;  Laterality: Right;   DIALYSIS/PERMA CATHETER INSERTION Right 02/10/2023   Procedure: DIALYSIS/PERMA CATHETER INSERTION;  Surgeon: Algernon Huxley, MD;  Location: [facility];  Service: Cardiovascular;  Laterality: Right;   DIALYSIS/PERMA CATHETER REMOVAL Right 02/17/2023   Procedure: DIALYSIS/PERMA CATHETER REMOVAL;  Surgeon: Algernon Huxley, MD;  Location: [facility];  Service: Cardiovascular;  Laterality: Right;   ESOPHAGOGASTRODUODENOSCOPY (EGD) WITH PROPOFOL N/A 02/19/2023   Procedure: ESOPHAGOGASTRODUODENOSCOPY (EGD) WITH PROPOFOL;  Surgeon: [place], Clifton  K, MD;  Location: [facility];  Service: Gastroenterology;  Laterality: N/A;   INGUINAL HERNIA REPAIR Right 02/04/2017   Procedure: HERNIA REPAIR INGUINAL ADULT;  Surgeon: Leonie Green, MD;  Location: [facility];  Service: General;  Laterality: Right;   NO PAST SURGERIES     QUADRICEPS TENDON REPAIR Left 02/15/2020   Procedure: REPAIR QUADRICEP TENDON;  Surgeon: Corky Mull, MD;  Location: [facility];  Service: Orthopedics;  Laterality: Left;    Social History:  reports that he quit smoking about 8 years ago. His smoking use included cigarettes. He has a 13.25 pack-year smoking history. He has never used smokeless tobacco. He reports current alcohol use. He reports that he does not use drugs.  Family History:  Family History  Problem Relation Age of Onset   Diabetes Mother    Diabetes Brother    Diabetes Maternal Grandmother    Diabetes Paternal Grandmother      Prior to Admission medications   Medication Sig Start Date End Date Taking? Authorizing Provider  ascorbic acid (VITAMIN C) 500 MG tablet Take 1 tablet (500 mg total) by mouth 2 (two) times daily. 03/04/23   Verline Lema, MD  calcitRIOL (ROCALTROL) 0.25 MCG capsule Take 0.25 mcg by mouth daily.    [provider]  feeding supplement (ENSURE ENLIVE / ENSURE PLUS) LIQD Take 237 mLs by mouth 3 (three) times daily between meals. 03/04/23   Verline Lema, MD  ipratropium-albuterol (DUONEB) 0.5-2.5 (3) MG/3ML SOLN Inhale 3 mLs into the lungs every 6 (six) hours as needed (wheezing).    [provider]  multivitamin (RENA-VIT) TABS tablet Take 1 tablet by mouth at bedtime. 03/04/23   Verline Lema, MD  pantoprazole (PROTONIX) 40 MG tablet Take 1 tablet (40 mg total) by mouth 2 (two) times daily. 03/04/23   Verline Lema, MD    Physical Exam: Vitals:   03/05/23 1330 03/05/23 1345 03/05/23 1400 03/05/23 1519  BP: 104/82  110/78 119/85  Pulse: 89 87 (!) 102 92  Resp: (!) 22 20 18 20   Temp:    98 F (36.7 C)  TempSrc:    Oral  SpO2: 100% 98% 90% 97%  Weight:      Height:       General: Not in acute distress HEENT:       Eyes: PERRL, EOMI, no scleral icterus.       ENT: No discharge from  the ears and nose, no pharynx injection, no tonsillar enlargement.        Neck: positive JVD, no bruit, no mass felt. Heme: No neck lymph node enlargement. Cardiac: S1/S2, RRR, No murmurs, No gallops or rubs. Respiratory: has fine crackles bilaterally. GI: Soft, nondistended, nontender, no rebound pain, no organomegaly, BS present. GU: No hematuria Ext: 1+ pitting leg edema bilaterally. 1+DP/PT pulse bilaterally. Musculoskeletal: No joint deformities, No joint redness or warmth, no limitation of ROM in spin. Skin: Has an open wound in the posterior left lower leg.    Neuro: Alert, oriented X3, cranial nerves II-XII grossly intact, moves all extremities. Psych: Patient is not psychotic, no suicidal or hemocidal ideation.  Labs on Admission: I have personally reviewed following labs and imaging studies  CBC: Recent Labs  Lab 02/27/23 0427 02/28/23 0708 03/01/23 0416 03/02/23 0411 03/04/23 0537 03/05/23 1228  WBC 11.3* 10.7* 9.8 9.3 11.7* 11.0*  NEUTROABS 9.2* 8.7* 8.1* 7.7 9.9*  --   HGB 8.3* 9.1* 9.0* 8.5* 8.9* 8.9*  HCT 27.0* 30.5*  30.4* 27.9* 29.8* 29.6*  MCV 99.3 100.0 101.3* 98.9 101.7* 102.8*  PLT 128* 142* 155 158 184 [id number]   Basic Metabolic Panel: Recent Labs  Lab 02/28/23 0708 02/28/23 0710 03/01/23 0416 03/02/23 0411 03/02/23 0412 03/03/23 0428 03/04/23 0537 03/05/23 1228  NA 142 141 139  138 137 135 136 139 138  K 5.3* 5.3* 5.0  5.0 4.5 4.5 4.7 4.9 5.0  CL 102 103 103  104 104 103 101 102 101  CO2 31 29 32  31 28 28 30 28 29   GLUCOSE 112* 108* 131*  137* 93 93 102* 107* 101*  BUN 29* 29* 28*  30* 30* 29* 31* 32* 32*  CREATININE 1.96* 1.92* 1.90*  1.92* 1.83* 1.86* 1.96* 2.21* 2.13*  CALCIUM 9.3 9.2 8.9  9.0 8.8* 8.7* 8.8* 8.9 9.2  MG 2.0  --  1.7 1.7  --  1.8 2.0  --   PHOS  --  3.0 3.0  --  2.6 3.1 4.3  --    GFR: Estimated Creatinine Clearance: 30.9 mL/min (A) (by C-G formula based on SCr of 2.13 mg/dL (H)). Liver Function Tests: Recent Labs   Lab 02/28/23 0710 03/01/23 0416 03/02/23 0412 03/03/23 0428 03/04/23 0537  ALBUMIN 2.5* 2.6* 2.5* 2.6* 2.7*   No results for input(s): "LIPASE", "AMYLASE" in the last 168 hours. No results for input(s): "AMMONIA" in the last 168 hours. Coagulation Profile: No results for input(s): "INR", "PROTIME" in the last 168 hours. Cardiac Enzymes: No results for input(s): "CKTOTAL", "CKMB", "CKMBINDEX", "TROPONINI" in the last 168 hours. BNP (last 3 results) No results for input(s): "PROBNP" in the last 8760 hours. HbA1C: No results for input(s): "HGBA1C" in the last 72 hours. CBG: Recent Labs  Lab 02/28/23 1016 02/28/23 1040  GLUCAP 110* 113*   Lipid Profile: No results for input(s): "CHOL", "HDL", "LDLCALC", "TRIG", "CHOLHDL", "LDLDIRECT" in the last 72 hours. Thyroid Function Tests: No results for input(s): "TSH", "T4TOTAL", "FREET4", "T3FREE", "THYROIDAB" in the last 72 hours. Anemia Panel: No results for input(s): "VITAMINB12", "FOLATE", "FERRITIN", "TIBC", "IRON", "RETICCTPCT" in the last 72 hours. Urine analysis:    Component Value Date/Time   COLORURINE YELLOW (A) 01/29/2023 2227   APPEARANCEUR HAZY (A) 01/29/2023 2227   LABSPEC 1.014 01/29/2023 2227   PHURINE 5.0 01/29/2023 2227   GLUCOSEU NEGATIVE 01/29/2023 2227   HGBUR MODERATE (A) 01/29/2023 2227   BILIRUBINUR NEGATIVE 01/29/2023 2227   KETONESUR 5 (A) 01/29/2023 2227   PROTEINUR NEGATIVE 01/29/2023 2227   NITRITE NEGATIVE 01/29/2023 2227   LEUKOCYTESUR TRACE (A) 01/29/2023 2227   Sepsis Labs: @LABRCNTIP (procalcitonin:4,lacticidven:4) )No results found for this or any previous visit (from the past 240 hour(s)).   Radiological Exams on Admission: DG Chest Port 1 View  Result Date: 03/05/2023 CLINICAL DATA:  Shortness of breath. EXAM: PORTABLE CHEST 1 VIEW COMPARISON:  February 04, 2023. FINDINGS: Stable cardiomediastinal silhouette. Mild right basilar atelectasis or infiltrate is noted with associated pleural  effusion. Left lung is unremarkable. Bony thorax is unremarkable. IMPRESSION: Mild right basilar atelectasis or infiltrate is noted with associated pleural effusion. Electronically Signed   By: Marijo Conception M.D.   On: 03/05/2023 13:10      Assessment/Plan Principal Problem:   Acute on chronic diastolic CHF (congestive heart failure) (HCC) Active Problems:   CKD (chronic kidney disease), stage IV (HCC)   Hypertension   COPD (chronic obstructive pulmonary disease) (HCC)   Macrocytic anemia   Duodenal ulcer   Open wound_in posterior left lower leg  Assessment and Plan:  Acute on chronic diastolic CHF (congestive heart failure) ([place]): 2D echo on 01/30/2023 showed EF of 50-55% with grade 1 diastolic dysfunction.  Due to recent hypovolemic shock, patient's diuretics were on hold.  He developed bilateral leg edema, shortness breath, positive JVD, elevated BNP 2659, clinically consistent with CHF exacerbation.  -Will admit to progressive unit as inpatient -Lasix 40 mg bid by IV -Daily weights -strict I/O's -Low salt diet -Fluid restriction -Obtain REDs Vest reading  CKD (chronic kidney disease), stage IV ([place]): Recent baseline creatinine 2.21 on 03/04/2023 at discharge.  Today his creatinine is 2.13, BUN 32, GFR 31, close to baseline. -Renal function closely with BMP  Hypertension: Blood pressure 104/82 -IV hydralazine as needed -Patient is on IV Lasix as above  COPD (chronic obstructive pulmonary disease) (HCC) -Bronchodilators  Macrocytic anemia: Hemoglobin 8.9 today (8.9 03/04/2023 at the discharge) -f/u by CBC  Duodenal ulcer -continue protonix 40 mg bid  Open wound_in posterior left lower leg: pt has mild leukocytosis with WBC 11.0.  No fever.  Clinically not septic. -wound care consult. -Started keflex 500 mg twice daily empirically      DVT ppx: SCD  Code Status: Full code ( Pt was DNR in last admission, however I discussed with the patient in the presence of his  friend today, explained the meaning of CODE STATUS, patient wants to change his CODE status to full code today.  Family Communication:   Yes, patient's friends at bed side.      Disposition Plan:  Anticipate discharge back to previous environment  Consults called:  none  Admission status and Level of care: Progressive:    as inpt      Dispo: The patient is from: SNF              Anticipated d/c is to: SNF              Anticipated d/c date is: 2 days              Patient currently is not medically stable to d/c.    Severity of Illness:  The appropriate patient status for this patient is INPATIENT. Inpatient status is judged to be reasonable and necessary in order to provide the required intensity of service to ensure the patient's safety. The patient's presenting symptoms, physical exam findings, and initial radiographic and laboratory data in the context of their chronic comorbidities is felt to place them at high risk for further clinical deterioration. Furthermore, it is not anticipated that the patient will be medically stable for discharge from the hospital within 2 midnights of admission.   * I certify that at the point of admission it is my clinical judgment that the patient will require inpatient hospital care spanning beyond 2 midnights from the point of admission due to high intensity of service, high risk for further deterioration and high frequency of surveillance required.*       Date of Service 03/05/2023    Ivor Costa [facility]   If 7PM-7AM, please contact night-coverage [email or web address] 03/05/2023, 4:25 PM

## 2023-03-05 NOTE — ED Notes (Signed)
Pt to ED via [facility] from [facility] in [place]. Per triage note, pt has been resident there since yesterday and staff called EMS today because SPO2 was low. Pt wears 3L and was not hypoxic with EMS and is not here. Pt has unlabored respirations. Lung fields clear.  Pt denies pain or other complaints. States he was "sent here". Pt has hx CKD and HF. Placed on cardiac monitor. Provider spoke with pt at bedside.

## 2023-03-05 NOTE — ED Notes (Signed)
Pt is sleeping. Pt rouses easily to voice. Pt went back asleep after blood drawn. Indentation left on arm from tourniquet. EDP informed.

## 2023-03-06 ENCOUNTER — Encounter: Payer: Self-pay | Admitting: Internal Medicine

## 2023-03-06 ENCOUNTER — Encounter: Payer: Medicare HMO | Admitting: Family

## 2023-03-06 DIAGNOSIS — I5033 Acute on chronic diastolic (congestive) heart failure: Secondary | ICD-10-CM | POA: Diagnosis not present

## 2023-03-06 LAB — BASIC METABOLIC PANEL
Anion gap: 4 — ABNORMAL LOW (ref 5–15)
BUN: 29 mg/dL — ABNORMAL HIGH (ref 8–23)
CO2: 27 mmol/L (ref 22–32)
Calcium: 8.8 mg/dL — ABNORMAL LOW (ref 8.9–10.3)
Chloride: 104 mmol/L (ref 98–111)
Creatinine, Ser: 2.03 mg/dL — ABNORMAL HIGH (ref 0.61–1.24)
GFR, Estimated: 33 mL/min — ABNORMAL LOW (ref >60)
Glucose, Bld: 90 mg/dL (ref 70–99)
Potassium: 4.6 mmol/L (ref 3.5–5.1)
Sodium: 135 mmol/L (ref 135–145)

## 2023-03-06 LAB — CBC
HCT: 28.9 % — ABNORMAL LOW (ref 39.0–52.0)
Hemoglobin: 8.6 g/dL — ABNORMAL LOW (ref 13.0–17.0)
MCH: 31 pg (ref 26.0–34.0)
MCHC: 29.8 g/dL — ABNORMAL LOW (ref 30.0–36.0)
MCV: 104.3 fL — ABNORMAL HIGH (ref 80.0–100.0)
Platelets: 170 10*3/uL (ref 150–400)
RBC: 2.77 MIL/uL — ABNORMAL LOW (ref 4.22–5.81)
RDW: 19.8 % — ABNORMAL HIGH (ref 11.5–15.5)
WBC: 10 10*3/uL (ref 4.0–10.5)
nRBC: 0 % (ref 0.0–0.2)

## 2023-03-06 LAB — MAGNESIUM: Magnesium: 1.8 mg/dL (ref 1.7–2.4)

## 2023-03-06 NOTE — Discharge Instructions (Signed)

## 2023-03-06 NOTE — TOC Initial Note (Addendum)
Transition of Care [facility]) - Initial/Assessment Note    Patient Details  Name: John [name] MRN: [id number] Date of Birth: [birth year]  [facility]) CM/SW Contact:    Tiburcio Bash, LCSW Phone Number: 03/06/2023, 11:14 AM  Clinical Narrative:                  Update: Per nephrology patient's numbers are good, no indication for HD needed at this time.   CSW spoke with patient at bedside, patient acknowledges being from [facility] for short term rehab. Recently discharged. Patient reports he wants the best qualify of life and if HD is indicated he would be agreeable to it this admission. CSW has informed MD and nephrology. He reports he wouldn't mind returning to [facility] at discharge if needed.   Expected Discharge Plan: Skilled Nursing Facility Barriers to Discharge: Continued Medical Work up   Patient Goals and CMS Choice Patient states their goals for this hospitalization and ongoing recovery are:: to have the best quality of life CMS Medicare.gov Compare Post Acute Care list provided to:: Patient Choice offered to / list presented to : Patient      Expected Discharge Plan and Services       Living arrangements for the past 2 months: Single Family Home                                      Prior Living Arrangements/Services Living arrangements for the past 2 months: Single Family Home                     Activities of Daily Living      Permission Sought/Granted                  Emotional Assessment              Admission diagnosis:  Acute on chronic diastolic CHF (congestive heart failure) ([place]) [I50.33] Patient Active Problem List   Diagnosis Date Noted   Macrocytic anemia 03/05/2023   Duodenal ulcer 03/05/2023   Open wound_in posterior left lower leg 03/05/2023   Protein-calorie malnutrition, severe [id number]   Toxic metabolic encephalopathy 0000000   Shock circulatory ([place]) 01/29/2023   Impaired fasting glucose  11/24/2022   Hypokalemia 11/24/2022   Acute on chronic respiratory failure with hypoxia and hypercapnia ([place]) 11/21/2022   Obesity (BMI 30-39.9) 11/21/2022   Anasarca 11/21/2022   Acute kidney injury superimposed on chronic kidney disease ([place]) 11/20/2022   Cellulitis 10/07/2022   Acute on chronic diastolic CHF (congestive heart failure) ([place]) 10/02/2022   Myocardial injury 10/02/2022   Hyperkalemia 10/02/2022   Acute hypoxic respiratory failure ([place]) 01/28/2022   Cardiorenal syndrome, stage 1-4 or unspecified chronic kidney disease, with heart failure ([place]) 01/28/2022   Acute left systolic heart failure ([place]) 01/28/2022   Cor pulmonale, acute ([place]) 01/28/2022   Renal failure syndrome 01/28/2022   CKD (chronic kidney disease), stage IV ([place]) 01/28/2022   COPD exacerbation ([place]) 01/28/2022   Cardiogenic shock ([place]) 01/28/2022   Erythrocytosis 12/20/2021   Thrombocytopenia ([place]) 12/20/2021   Acute on chronic congestive heart failure ([place]) 06/21/2020   Hypertension    COPD (chronic obstructive pulmonary disease) ([place]) 02/16/2020   Rupture of left quadriceps tendon 02/15/2020   Community acquired pneumonia 09/01/2019   Pneumonia 09/01/2019   PCP:  Alisa Graff, FNP Pharmacy:   [facility]  [place] ([place]) [place] [address] Phone: [phone] Fax: [phone]     Social Determinants of Health (SDOH) Social History: SDOH Screenings   Food Insecurity: No Food Insecurity (01/29/2023)  Housing: Low Risk  (01/29/2023)  Transportation Needs: No Transportation Needs (01/29/2023)  Utilities: Not At Risk (01/29/2023)  Alcohol Screen: Low Risk  (07/17/2022)  Depression (PHQ2-9): Low Risk  (07/30/2022)  Financial Resource Strain: Low Risk  (07/17/2022)  Physical Activity: Sufficiently Active (07/17/2022)  Social Connections: Socially Integrated (07/17/2022)  Stress: No Stress Concern Present (07/17/2022)  Tobacco  Use: Medium Risk (03/05/2023)   SDOH Interventions:     Readmission Risk Interventions    01/30/2023   11:09 AM  Readmission Risk Prevention Plan  Transportation Screening Complete  Medication Review ([place]) Complete  PCP or Specialist appointment within 3-5 days of discharge Complete  HRI or [place] Complete  SW Recovery Care/Counseling Consult Complete  Palliative Care Screening Complete  [place] Not Applicable

## 2023-03-06 NOTE — Care Management Important Message (Signed)
Important Message  Patient Details  Name: Tyrese Zurlo MRN: [id number] Date of Birth: 26-Sep-1946   Medicare Important Message Given:  N/A - LOS <3 / Initial given by admissions     [name] 03/06/2023, 2:56 PM

## 2023-03-06 NOTE — NC FL2 (Signed)
[place] LEVEL OF CARE FORM     IDENTIFICATION  Patient Name: John [name] Birthdate: Dec 22, 1945 Sex: male Admission Date (Current Location): 03/05/2023  [facility] and [place] Number:  Engineering geologist and Address:  [facility], [address] [place], [place] [address]      Provider Number: [id number]  Attending Physician Name and Address:  Fritzi Mandes, MD  Relative Name and Phone Number:  Tawni Carnes [phone]    Current Level of Care: Hospital Recommended Level of Care: [place] Prior Approval Number:    Date Approved/Denied:   PASRR Number: [id number]  Discharge Plan: SNF    Current Diagnoses: Patient Active Problem List   Diagnosis Date Noted   Macrocytic anemia 03/05/2023   Duodenal ulcer 03/05/2023   Open wound_in posterior left lower leg 03/05/2023   Protein-calorie malnutrition, severe [id number]   Toxic metabolic encephalopathy 0000000   Shock circulatory ([place]) 01/29/2023   Impaired fasting glucose 11/24/2022   Hypokalemia 11/24/2022   Acute on chronic respiratory failure with hypoxia and hypercapnia ([place]) 11/21/2022   Obesity (BMI 30-39.9) 11/21/2022   Anasarca 11/21/2022   Acute kidney injury superimposed on chronic kidney disease ([place]) 11/20/2022   Cellulitis 10/07/2022   Acute on chronic diastolic CHF (congestive heart failure) ([place]) 10/02/2022   Myocardial injury 10/02/2022   Hyperkalemia 10/02/2022   Acute hypoxic respiratory failure ([place]) 01/28/2022   Cardiorenal syndrome, stage 1-4 or unspecified chronic kidney disease, with heart failure (HCC) 01/28/2022   Acute left systolic heart failure (HCC) 01/28/2022   Cor pulmonale, acute ([place]) 01/28/2022   Renal failure syndrome 01/28/2022   CKD (chronic kidney disease), stage IV ([place]) 01/28/2022   COPD exacerbation ([place]) 01/28/2022   Cardiogenic shock ([place]) 01/28/2022   Erythrocytosis 12/20/2021   Thrombocytopenia ([place])  12/20/2021   Acute on chronic congestive heart failure ([place]) 06/21/2020   Hypertension    COPD (chronic obstructive pulmonary disease) ([place]) 02/16/2020   Rupture of left quadriceps tendon 02/15/2020   Community acquired pneumonia 09/01/2019   Pneumonia 09/01/2019    Orientation RESPIRATION BLADDER Height & Weight     Self, Time, Situation, Place  O2 (6L nasal cannula) Incontinent Weight: 185 lb 3 oz (84 kg) Height:  5\' 11"  (180.3 cm)  BEHAVIORAL SYMPTOMS/MOOD NEUROLOGICAL BOWEL NUTRITION STATUS      Incontinent Diet  AMBULATORY STATUS COMMUNICATION OF NEEDS Skin   Extensive Assist Verbally Normal                       Personal Care Assistance Level of Assistance  Bathing, Feeding, Dressing, Total care Bathing Assistance: Maximum assistance Feeding assistance: Independent   Total Care Assistance: Maximum assistance   Functional Limitations Info  Sight, Hearing, Speech Sight Info: Adequate Hearing Info: Adequate Speech Info: Adequate    SPECIAL CARE FACTORS FREQUENCY  PT (By licensed PT), OT (By licensed OT)     PT Frequency: min 4x weekly OT Frequency: min 4x weekly            Contractures Contractures Info: Not present    Additional Factors Info  Code Status, Allergies Code Status Info: DNR Allergies Info: NKA           Current Medications (03/06/2023):  This is the current hospital active medication list Current Facility-Administered Medications  Medication Dose Route Frequency Provider Last Rate Last Admin   acetaminophen (TYLENOL) tablet 650 mg  650 mg Oral Q6H PRN Ivor Costa, MD   650 mg at 03/05/23  1528   albuterol (PROVENTIL) (2.5 MG/3ML) 0.083% nebulizer solution 2.5 mg  2.5 mg Nebulization Q4H PRN Ivor Costa, MD       ascorbic acid (VITAMIN C) tablet 500 mg  500 mg Oral BID Ivor Costa, MD   500 mg at 03/06/23 1052   bacitracin ointment   Topical BID Ivor Costa, MD   Given at 03/06/23 1053   calcitRIOL (ROCALTROL) capsule 0.25 mcg  0.25 mcg  Oral Daily Ivor Costa, MD   0.25 mcg at 03/06/23 1052   cephALEXin (KEFLEX) capsule 500 mg  500 mg Oral Q12H Ivor Costa, MD   500 mg at 03/06/23 1052   dextromethorphan-guaiFENesin ([place] DM) 30-600 MG per 12 hr tablet 1 tablet  1 tablet Oral BID PRN Ivor Costa, MD       feeding supplement (ENSURE ENLIVE / ENSURE PLUS) liquid 237 mL  237 mL Oral TID BM Ivor Costa, MD   237 mL at 03/06/23 1056   furosemide (LASIX) injection 40 mg  40 mg Intravenous Q12H Ivor Costa, MD   40 mg at 03/06/23 0310   hydrALAZINE (APRESOLINE) injection 5 mg  5 mg Intravenous Q2H PRN Ivor Costa, MD       multivitamin (RENA-VIT) tablet 1 tablet  1 tablet Oral QHS Ivor Costa, MD       ondansetron [facility]) injection 4 mg  4 mg Intravenous Q8H PRN Ivor Costa, MD       oxyCODONE-acetaminophen (PERCOCET/ROXICET) 5-325 MG per tablet 1 tablet  1 tablet Oral Q4H PRN Ivor Costa, MD   1 tablet at 03/06/23 1106   pantoprazole (PROTONIX) EC tablet 40 mg  40 mg Oral BID Ivor Costa, MD   40 mg at 03/06/23 1052   Current Outpatient Medications  Medication Sig Dispense Refill   ascorbic acid (VITAMIN C) 500 MG tablet Take 1 tablet (500 mg total) by mouth 2 (two) times daily. 30 tablet 0   calcitRIOL (ROCALTROL) 0.25 MCG capsule Take 0.25 mcg by mouth daily.     feeding supplement (ENSURE ENLIVE / ENSURE PLUS) LIQD Take 237 mLs by mouth 3 (three) times daily between meals. 237 mL 12   ipratropium-albuterol (DUONEB) 0.5-2.5 (3) MG/3ML SOLN Inhale 3 mLs into the lungs every 6 (six) hours as needed (wheezing).     multivitamin (RENA-VIT) TABS tablet Take 1 tablet by mouth at bedtime. 30 tablet 0   pantoprazole (PROTONIX) 40 MG tablet Take 1 tablet (40 mg total) by mouth 2 (two) times daily. 120 tablet 2     Discharge Medications: Please see discharge summary for a list of discharge medications.  Relevant Imaging Results:  Relevant Lab Results:   Additional Information SSN: [id number]  Tiburcio Bash, LCSW

## 2023-03-06 NOTE — Consult Note (Addendum)
[place] Nurse Consult Note: Reason for Consult: Consult requested for left leg wound.  Performed remotely after review of progress notes and photos in the EMR.  Wound type: Left posterior calf with full thickness wound, red and moist with mod amt yellow drainage.  Dressing procedure/placement/frequency: Topical treatment orders provided for bedside nurses to perform as follows to absorb drainage and provide antimicrobial benefits: Apply Aquacel Kellie Simmering # [phone]) to left leg wound Q day, then cover with ABD pad and kerlex. Please re-consult if further assistance is needed.  Thank-you,  Julien Girt MSN, [place], [place], [place], [place]

## 2023-03-06 NOTE — Progress Notes (Signed)
[place] at [place] NAME: Caswell Kalen    MR#:  [id number]  DATE OF BIRTH:  1946/06/09  SUBJECTIVE:  no family in the ER. Patient sleepy and not interested in talking to me. Telling me I am fine when asked few questions. Came into the emergency room with increasing shortness of breath from vitals manner he was just discharge two days ago. Currently on 6 L nasal cannula oxygen will try to wean down    VITALS:  Blood pressure (!) 114/58, pulse 87, temperature 97.9 F (36.6 C), temperature source Oral, resp. rate 12, height 5\' 11"  (1.803 m), weight 84 kg, SpO2 96 %.  PHYSICAL EXAMINATION:   GENERAL:  77 y.o.-year-old patient with mild acute distress. Chronically ill LUNGS: decreased breath sounds bilaterally, no wheezing CARDIOVASCULAR: S1, S2 normal. No murmur   ABDOMEN: Soft, nontender, nondistended. Bowel sounds present.  EXTREMITIES: chronic leg edema with chronic skin ulcer   NEUROLOGIC: nonfocal  patient is sleepy SKIN   LABORATORY PANEL:  CBC Recent Labs  Lab 03/06/23 0525  WBC 10.0  HGB 8.6*  HCT 28.9*  PLT 170    Chemistries  Recent Labs  Lab 03/06/23 0525  NA 135  K 4.6  CL 104  CO2 27  GLUCOSE 90  BUN 29*  CREATININE 2.03*  CALCIUM 8.8*  MG 1.8    RADIOLOGY:  DG Chest Port 1 View  Result Date: 03/05/2023 CLINICAL DATA:  Shortness of breath. EXAM: PORTABLE CHEST 1 VIEW COMPARISON:  February 04, 2023. FINDINGS: Stable cardiomediastinal silhouette. Mild right basilar atelectasis or infiltrate is noted with associated pleural effusion. Left lung is unremarkable. Bony thorax is unremarkable. IMPRESSION: Mild right basilar atelectasis or infiltrate is noted with associated pleural effusion. Electronically Signed   By: Marijo Conception M.D.   On: 03/05/2023 13:10    Assessment and Plan   Akxel Thiam is a 77 y.o. male with medical history significant of hypertension, hyperlipidemia, COPD on 3 L oxygen,  diastolic CHF CKD stage IV, duodenal ulcer with bleeding, recent admission due to hypovolemic and cardiogenic shock, who presents with shortness of breath.   Patient was recently hospitalized from 2/14 - 3/19 due to hypovolemic and cardiogenic shock in the setting of duodenal ulcer bleeding. His losartan, metolazone and torsemide were on hold at discharge. Pt states that he developed shortness of breath since yesterday, which has been progressively worsening.  pt was found to have BNP 2659,   Acute on chronic diastolic CHF (congestive heart failure) ([place]): 2D echo on 01/30/2023 showed EF of 50-55% with grade 1 diastolic dysfunction.  Due to recent hypovolemic shock, patient's diuretics were on hold.  He developed bilateral leg edema, shortness breath, positive JVD, elevated BNP 2659, clinically consistent with CHF exacerbation.  -Lasix 40 mg bid by IV -Daily weights -strict I/O's -Low salt diet -Fluid restriction  CKD (chronic kidney disease), stage IV ([place]): Recent baseline creatinine 2.21 on 03/04/2023 at discharge.  Today his creatinine is 2.13, BUN 32, GFR 31, close to baseline. -Renal function closely with BMP --Dr Juleen China to see pt. --pt does not need HD for now   Hypertension: Blood pressure 104/82 -IV hydralazine as needed -Patient is on IV Lasix as above   COPD (chronic obstructive pulmonary disease) (HCC) -Bronchodilators   Macrocytic anemia: Hemoglobin 8.9 today (8.9 03/04/2023 at the discharge) -f/u by CBC   Duodenal ulcer -continue protonix 40 mg bid   Open wound_in posterior left lower leg: pt has  mild leukocytosis with WBC 11.0.  No fever.  Clinically not septic. -wound care consult. --hold off abxs      Procedures: Family communication :no family in ER Consults :nephrology CODE STATUS: Full--pt was DNR 2 days ago when he left. He was seen by Palliative care. Will need to re-address to make sure pt understans DVT Prophylaxis :SCD Level of care:  Progressive Status is: Inpatient Remains inpatient appropriate because: CHF  Overall poor prognosis.    TOTAL TIME TAKING CARE OF THIS PATIENT: 35 minutes.  >50% time spent on counselling and coordination of care  Note: This dictation was prepared with Dragon dictation along with smaller phrase technology. Any transcriptional errors that result from this process are unintentional.  Fritzi Mandes M.D    [facility]   CC: Primary care physician; Alisa Graff, FNP

## 2023-03-07 DIAGNOSIS — I5033 Acute on chronic diastolic (congestive) heart failure: Secondary | ICD-10-CM | POA: Diagnosis not present

## 2023-03-07 LAB — BASIC METABOLIC PANEL
Anion gap: 13 (ref 5–15)
BUN: 36 mg/dL — ABNORMAL HIGH (ref 8–23)
CO2: 33 mmol/L — ABNORMAL HIGH (ref 22–32)
Calcium: 9.8 mg/dL (ref 8.9–10.3)
Chloride: 96 mmol/L — ABNORMAL LOW (ref 98–111)
Creatinine, Ser: 2.12 mg/dL — ABNORMAL HIGH (ref 0.61–1.24)
GFR, Estimated: 31 mL/min — ABNORMAL LOW (ref >60)
Glucose, Bld: 102 mg/dL — ABNORMAL HIGH (ref 70–99)
Potassium: 4.3 mmol/L (ref 3.5–5.1)
Sodium: 137 mmol/L (ref 135–145)

## 2023-03-07 LAB — MAGNESIUM: Magnesium: 1.8 mg/dL (ref 1.7–2.4)

## 2023-03-07 NOTE — Progress Notes (Signed)
[name]  PROGRESS NOTE   Subjective:   Presented with shortness of breath after being discharged on 3./19.   Patient was found to have acute exacerbation of congestive heart failure and started on IV furosemide. Patient with good urine output.   Patient states he was afraid he will need further dialysis.   Objective:  Vital signs: Blood pressure 118/73, pulse 90, temperature 98 F (36.7 C), temperature source Oral, resp. rate 17, height 5\' 11"  (1.803 m), weight 83.8 kg, SpO2 91 %.  Intake/Output Summary (Last 24 hours) at 03/07/2023 1232 Last data filed at 03/07/2023 1100 Gross per 24 hour  Intake 1450 ml  Output 2500 ml  Net -1050 ml    Filed Weights   03/05/23 1121 03/07/23 0928  Weight: 84 kg 83.8 kg     Physical Exam: General:  No acute distress  Head:  Normocephalic, atraumatic. Moist oral mucosal membranes  Eyes:  Anicteric  Lungs:   Diminished bilaterally  Heart:  regular  Abdomen:   Soft, nontender, bowel sounds present  Extremities: Trace peripheral edema.  Neurologic:  Awake, alert, following commands  Skin:  No lesions  Access: None    Basic Metabolic Panel: Recent Labs  Lab 03/01/23 0416 03/02/23 0411 03/02/23 0412 03/03/23 0428 03/04/23 0537 03/05/23 1228 03/06/23 0525 03/07/23 0713  NA 139  138 137 135 136 139 138 135 137  K 5.0  5.0 4.5 4.5 4.7 4.9 5.0 4.6 4.3  CL 103  104 104 103 101 102 101 104 96*  CO2 32  31 28 28 30 28 29 27  33*  GLUCOSE 131*  137* 93 93 102* 107* 101* 90 102*  BUN 28*  30* 30* 29* 31* 32* 32* 29* 36*  CREATININE 1.90*  1.92* 1.83* 1.86* 1.96* 2.21* 2.13* 2.03* 2.12*  CALCIUM 8.9  9.0 8.8* 8.7* 8.8* 8.9 9.2 8.8* 9.8  MG 1.7 1.7  --  1.8 2.0  --  1.8 1.8  PHOS 3.0  --  2.6 3.1 4.3  --   --   --     GFR: Estimated Creatinine Clearance: 31.1 mL/min (A) (by C-G formula based on SCr of 2.12 mg/dL (H)).  Liver Function Tests: Recent Labs  Lab 03/01/23 0416 03/02/23 0412 03/03/23 0428  03/04/23 0537  ALBUMIN 2.6* 2.5* 2.6* 2.7*    No results for input(s): "LIPASE", "AMYLASE" in the last 168 hours. No results for input(s): "AMMONIA" in the last 168 hours.  CBC: Recent Labs  Lab 03/01/23 0416 03/02/23 0411 03/04/23 0537 03/05/23 1228 03/06/23 0525  WBC 9.8 9.3 11.7* 11.0* 10.0  NEUTROABS 8.1* 7.7 9.9*  --   --   HGB 9.0* 8.5* 8.9* 8.9* 8.6*  HCT 30.4* 27.9* 29.8* 29.6* 28.9*  MCV 101.3* 98.9 101.7* 102.8* 104.3*  PLT 155 158 184 196 170      HbA1C: Hgb A1c MFr Bld  Date/Time Value Ref Range Status  10/02/2022 11:16 PM 6.3 (H) 4.8 - 5.6 % Final    Comment:    (NOTE) Pre diabetes:          5.7%-6.4%  Diabetes:              >6.4%  Glycemic control for   <7.0% adults with diabetes     Urinalysis: No results for input(s): "COLORURINE", "LABSPEC", "PHURINE", "GLUCOSEU", "HGBUR", "BILIRUBINUR", "KETONESUR", "PROTEINUR", "UROBILINOGEN", "NITRITE", "LEUKOCYTESUR" in the last 72 hours.  Invalid input(s): "APPERANCEUR"    Imaging: DG Chest Port 1 View  Result Date: 03/05/2023 CLINICAL DATA:  Shortness of breath. EXAM: PORTABLE CHEST 1 VIEW COMPARISON:  February 04, 2023. FINDINGS: Stable cardiomediastinal silhouette. Mild right basilar atelectasis or infiltrate is noted with associated pleural effusion. Left lung is unremarkable. Bony thorax is unremarkable. IMPRESSION: Mild right basilar atelectasis or infiltrate is noted with associated pleural effusion. Electronically Signed   By: Marijo Conception M.D.   On: 03/05/2023 13:10     Medications:      ascorbic acid  500 mg Oral BID   bacitracin   Topical BID   calcitRIOL  0.25 mcg Oral Daily   feeding supplement  237 mL Oral TID BM   furosemide  40 mg Intravenous Q12H   multivitamin  1 tablet Oral QHS   pantoprazole  40 mg Oral BID    Assessment/ Plan:     John [name] is a 77 y.o. black male with chronic diastolic congestive heart failure, hypertension, COPD, hyperlipidemia, who is admitted to  [facility] on 03/05/2023 for Acute pulmonary edema (HCC) [J81.0] Acute on chronic diastolic CHF (congestive heart failure) (HCC) [I50.33] Acute on chronic respiratory failure with hypoxia (HCC) [J96.21] Acute on chronic congestive heart failure, unspecified heart failure type ([place]) [I50.9]   #1: Chronic kidney disease stage IIIB: creatinine at baseline 2.12, GFR of 31. Patient did require dialysis on last admission due to acute cardiorenal syndrome, GI bleed and then with hypovolemic shock. Currently with nonoliguric urine output responding to loop diuretics.    #2: Acute respiratory failure secondary to acute exacerbation of chronic diastolic congestive heart failure. Patient's admission weight is approximately 4 kg above his discharge weight from earlier in the week. Continue furosemide. Continue supplemental oxygen.    #3: Hypotension: no longer on blood pressure agent. Continue to monitor.   #4: Anemia with chronic kidney disease and acute blood loss with recent GI bleed. Hemoglobin is stable however low. Macrocytic. Check iron studies and vitamin B12/folate    LOS: 2 [facility] 3/22/202412:32 PM

## 2023-03-07 NOTE — Progress Notes (Addendum)
   Heart Failure Nurse Navigator Note  Met with patient this morning he was dangling on the edge of the bed having just finished breakfast.  In no acute distress.  States that he does not feel very well.  He goes on to state that he is tired of answering the same questions.  He refused to lie back in bed for me to do a Reds vest clip reading.  At this time he did not make eye contact continued to  look at the floor the entire time.  Attempted to engage him in other conversation, with no result.  Will attempt at another time to visit with the patient.  Pricilla Riffle RN CHFN

## 2023-03-07 NOTE — Plan of Care (Signed)
Patient AOX4, VSS throughout shift.  All meds given on time as ordered.  Pt c/o bilateral foot pain relieved by PRN percocet.  Pt became tachycardic, HR 107-114 this morning.  Pt sat up on side on bed for comfort.  Diminished lungs, IS encouraged.  Purewick in place.  POC maintained, will continue to monitor.  Problem: Education: Goal: Ability to demonstrate management of disease process will improve Outcome: Progressing Goal: Ability to verbalize understanding of medication therapies will improve Outcome: Progressing Goal: Individualized Educational Video(s) Outcome: Progressing   Problem: Activity: Goal: Capacity to carry out activities will improve Outcome: Progressing   Problem: Cardiac: Goal: Ability to achieve and maintain adequate cardiopulmonary perfusion will improve Outcome: Progressing   Problem: Education: Goal: Knowledge of General Education information will improve Description: Including pain rating scale, medication(s)/side effects and non-pharmacologic comfort measures Outcome: Progressing   Problem: Health Behavior/Discharge Planning: Goal: Ability to manage health-related needs will improve Outcome: Progressing   Problem: Clinical Measurements: Goal: Ability to maintain clinical measurements within normal limits will improve Outcome: Progressing Goal: Will remain free from infection Outcome: Progressing Goal: Diagnostic test results will improve Outcome: Progressing Goal: Respiratory complications will improve Outcome: Progressing Goal: Cardiovascular complication will be avoided Outcome: Progressing   Problem: Activity: Goal: Risk for activity intolerance will decrease Outcome: Progressing   Problem: Nutrition: Goal: Adequate nutrition will be maintained Outcome: Progressing   Problem: Coping: Goal: Level of anxiety will decrease Outcome: Progressing   Problem: Elimination: Goal: Will not experience complications related to bowel  motility Outcome: Progressing Goal: Will not experience complications related to urinary retention Outcome: Progressing   Problem: Pain Managment: Goal: General experience of comfort will improve Outcome: Progressing   Problem: Safety: Goal: Ability to remain free from injury will improve Outcome: Progressing   Problem: Skin Integrity: Goal: Risk for impaired skin integrity will decrease Outcome: Progressing

## 2023-03-07 NOTE — TOC Progression Note (Addendum)
Transition of Care [facility]) - Progression Note    Patient Details  Name: John [name] MRN: [id number] Date of Birth: Mar 23, 1946  [facility] CM/SW Contact  Laurena Slimmer, RN Phone Number: 03/07/2023, 4:04 PM  Clinical Narrative:    Attempt to reach patient's friend per her request. No answer. Left a message.   Bed search extended   Expected Discharge Plan: [place] Barriers to Discharge: Continued Medical Work up  Expected Discharge Plan and Services       Living arrangements for the past 2 months: Single Family Home                                       Social Determinants of Health (SDOH) Interventions SDOH Screenings   Food Insecurity: No Food Insecurity (01/29/2023)  Housing: Low Risk  (01/29/2023)  Transportation Needs: No Transportation Needs (01/29/2023)  Utilities: Not At Risk (01/29/2023)  Alcohol Screen: Low Risk  (07/17/2022)  Depression (PHQ2-9): Low Risk  (07/30/2022)  Financial Resource Strain: Low Risk  (07/17/2022)  Physical Activity: Sufficiently Active (07/17/2022)  Social Connections: Socially Integrated (07/17/2022)  Stress: No Stress Concern Present (07/17/2022)  Tobacco Use: Medium Risk (03/06/2023)    Readmission Risk Interventions    01/30/2023   11:09 AM  Readmission Risk Prevention Plan  Transportation Screening Complete  Medication Review ([place]) Complete  PCP or Specialist appointment within 3-5 days of discharge Complete  HRI or [place] Complete  SW Recovery Care/Counseling Consult Complete  [place] Not Applicable

## 2023-03-07 NOTE — Progress Notes (Signed)
[place] at [place] NAME: Tanis Malczewski    MR#:  [id number]  DATE OF BIRTH:  1946-05-30  SUBJECTIVE:  no family in the ER. Not the best historian. Gets irritable when I ask questions. No resp distress , good uop  VITALS:  Blood pressure 118/73, pulse 90, temperature 98 F (36.7 C), temperature source Oral, resp. rate 17, height 5\' 11"  (1.803 m), weight 83.8 kg, SpO2 91 %.  PHYSICAL EXAMINATION:   GENERAL:  77 y.o.-year-old patient with mild acute distress. Chronically ill LUNGS: decreased breath sounds bilaterally, no wheezing CARDIOVASCULAR: S1, S2 normal. ABDOMEN: Soft, nontender, nondistended. EXTREMITIES: chronic leg edema with chronic skin ulcer   NEUROLOGIC: nonfocal  patient is awake SKIN   LABORATORY PANEL:  CBC Recent Labs  Lab 03/06/23 0525  WBC 10.0  HGB 8.6*  HCT 28.9*  PLT 170     Chemistries  Recent Labs  Lab 03/07/23 0713  NA 137  K 4.3  CL 96*  CO2 33*  GLUCOSE 102*  BUN 36*  CREATININE 2.12*  CALCIUM 9.8  MG 1.8     RADIOLOGY:  No results found.  Assessment and Plan   Corion Guinan is a 77 y.o. male with medical history significant of hypertension, hyperlipidemia, COPD on 3 L oxygen, diastolic CHF CKD stage IV, duodenal ulcer with bleeding, recent admission due to hypovolemic and cardiogenic shock, who presents with shortness of breath.   Patient was recently hospitalized from 2/14 - 3/19 due to hypovolemic and cardiogenic shock in the setting of duodenal ulcer bleeding. His losartan, metolazone and torsemide were on hold at discharge. Pt states that he developed shortness of breath since yesterday, which has been progressively worsening.  pt was found to have BNP 2659,   Acute on chronic diastolic CHF (congestive heart failure) ([place]): 2D echo on 01/30/2023 showed EF of 50-55% with grade 1 diastolic dysfunction.  Due to recent hypovolemic shock, patient's diuretics were on hold.  He  developed bilateral leg edema, shortness breath, positive JVD, elevated BNP 2659, clinically consistent with CHF exacerbation.  -Lasix 40 mg bid by IV -Daily weights -strict I/O's -Low salt diet -Fluid restriction  CKD (chronic kidney disease), stage IV ([place]): Recent baseline creatinine 2.21 on 03/04/2023 at discharge.  Today his creatinine is 2.13, BUN 32, GFR 31, close to baseline. -Renal function closely with BMP --Dr Juleen China input appreciated --pt does not need HD for now   Hypertension: Blood pressure 104/82 -IV hydralazine as needed -Patient is on IV Lasix as above   COPD (chronic obstructive pulmonary disease) (HCC) -Bronchodilators   Macrocytic anemia: Hemoglobin 8.9 today (8.9 03/04/2023 at the discharge) -f/u by CBC   Duodenal ulcer -continue protonix 40 mg bid   Open wound_in posterior left lower leg: pt has mild leukocytosis with WBC 11.0.  No fever.  Clinically not septic. -wound care consult. --hold off abxs      Procedures: Family communication :no family in ER Consults :nephrology CODE STATUS: Full--pt was DNR 2 days ago when he left. He was seen by Palliative care. Pt gets very offenisve when I tired to discuss code status DVT Prophylaxis :SCD Level of care: Progressive Status is: Inpatient Remains inpatient appropriate because: CHF  Overall poor prognosis.    TOTAL TIME TAKING CARE OF THIS PATIENT: 35 minutes.  >50% time spent on counselling and coordination of care  Note: This dictation was prepared with Dragon dictation along with smaller phrase technology. Any transcriptional errors that result  from this process are unintentional.  Fritzi Mandes M.D    [facility]   CC: Primary care physician; Alisa Graff, FNP

## 2023-03-07 NOTE — TOC Initial Note (Addendum)
Transition of Care [facility]) - Initial/Assessment Note    Patient Details  Name: John [name] MRN: [id number] Date of Birth: 24-Jun-1946  Transition of Care [facility]) CM/SW Contact:    Laurena Slimmer, RN Phone Number: 03/07/2023, 10:45 AM  Clinical Narrative:                 Patient admitted from [facility].  Spoke with Neoma Laming in admissions regarding possible discharge tomorrow. She stated she would have to speak with her DON since patient status changed from DNR to FULL code.  10:50am Spoke with Neoma Laming in admissions. Facility is unable to take the patient back due to not being able to meet the his needs. MD notified.    Expected Discharge Plan: Skilled Nursing Facility Barriers to Discharge: Continued Medical Work up   Patient Goals and CMS Choice Patient states their goals for this hospitalization and ongoing recovery are:: to have the best quality of life CMS Medicare.gov Compare Post Acute Care list provided to:: Patient Choice offered to / list presented to : Patient      Expected Discharge Plan and Services       Living arrangements for the past 2 months: Single Family Home                                      Prior Living Arrangements/Services Living arrangements for the past 2 months: Single Family Home                     Activities of Daily Living Home Assistive Devices/Equipment: Oxygen, Wheelchair ADL Screening (condition at time of admission) Patient's cognitive ability adequate to safely complete daily activities?: No Is the patient deaf or have difficulty hearing?: No Does the patient have difficulty seeing, even when wearing glasses/contacts?: Yes Does the patient have difficulty concentrating, remembering, or making decisions?: Yes Patient able to express need for assistance with ADLs?: No Does the patient have difficulty dressing or bathing?: Yes Independently performs ADLs?: No Communication: Independent Dressing (OT):  Dependent Is this a change from baseline?: Pre-admission baseline Grooming: Dependent Is this a change from baseline?: Pre-admission baseline Feeding: Needs assistance Is this a change from baseline?: Pre-admission baseline Bathing: Dependent Is this a change from baseline?: Pre-admission baseline Toileting: Dependent Is this a change from baseline?: Pre-admission baseline In/Out Bed: Dependent Is this a change from baseline?: Pre-admission baseline Walks in Home: Dependent Is this a change from baseline?: Pre-admission baseline Does the patient have difficulty walking or climbing stairs?: Yes Weakness of Legs: Both Weakness of Arms/Hands: Both  Permission Sought/Granted                  Emotional Assessment              Admission diagnosis:  Acute pulmonary edema (HCC) [J81.0] Acute on chronic diastolic CHF (congestive heart failure) ([place]) [I50.33] Acute on chronic respiratory failure with hypoxia ([place]) [J96.21] Acute on chronic congestive heart failure, unspecified heart failure type ([place]) [I50.9] Patient Active Problem List   Diagnosis Date Noted   Macrocytic anemia 03/05/2023   Duodenal ulcer 03/05/2023   Open wound_in posterior left lower leg 03/05/2023   Protein-calorie malnutrition, severe [id number]   Toxic metabolic encephalopathy 0000000   Shock circulatory ([place]) 01/29/2023   Impaired fasting glucose 11/24/2022   Hypokalemia 11/24/2022   Acute on chronic respiratory failure with hypoxia and hypercapnia ([place]) 11/21/2022   Obesity (  BMI 30-39.9) 11/21/2022   Anasarca 11/21/2022   Acute kidney injury superimposed on chronic kidney disease ([place]) 11/20/2022   Cellulitis 10/07/2022   Acute on chronic diastolic CHF (congestive heart failure) ([place]) 10/02/2022   Myocardial injury 10/02/2022   Hyperkalemia 10/02/2022   Acute hypoxic respiratory failure ([place]) 01/28/2022   Cardiorenal syndrome, stage 1-4 or unspecified chronic kidney disease, with heart  failure ([place]) 01/28/2022   Acute left systolic heart failure ([place]) 01/28/2022   Cor pulmonale, acute ([place]) 01/28/2022   Renal failure syndrome 01/28/2022   CKD (chronic kidney disease), stage IV ([place]) 01/28/2022   COPD exacerbation ([place]) 01/28/2022   Cardiogenic shock ([place]) 01/28/2022   Erythrocytosis 12/20/2021   Thrombocytopenia ([place]) 12/20/2021   Acute on chronic congestive heart failure ([place]) 06/21/2020   Hypertension    COPD (chronic obstructive pulmonary disease) ([place]) 02/16/2020   Rupture of left quadriceps tendon 02/15/2020   Community acquired pneumonia 09/01/2019   Pneumonia 09/01/2019   PCP:  Alisa Graff, FNP Pharmacy:   [facility] [address] ([address]), [place] [address] [place] ([place])  [address] Phone: [phone] Fax: [phone]     Social Determinants of Health (SDOH) Social History: SDOH Screenings   Food Insecurity: No Food Insecurity (01/29/2023)  Housing: Low Risk  (01/29/2023)  Transportation Needs: No Transportation Needs (01/29/2023)  Utilities: Not At Risk (01/29/2023)  Alcohol Screen: Low Risk  (07/17/2022)  Depression (PHQ2-9): Low Risk  (07/30/2022)  Financial Resource Strain: Low Risk  (07/17/2022)  Physical Activity: Sufficiently Active (07/17/2022)  Social Connections: Socially Integrated (07/17/2022)  Stress: No Stress Concern Present (07/17/2022)  Tobacco Use: Medium Risk (03/06/2023)   SDOH Interventions:     Readmission Risk Interventions    01/30/2023   11:09 AM  Readmission Risk Prevention Plan  Transportation Screening Complete  Medication Review (RN Care Manager) Complete  PCP or Specialist appointment within 3-5 days of discharge Complete  HRI or [place] Complete  SW Recovery Care/Counseling Consult Complete  Palliative Care Screening Complete  [place] Not Applicable

## 2023-03-08 DIAGNOSIS — J9621 Acute and chronic respiratory failure with hypoxia: Secondary | ICD-10-CM | POA: Diagnosis not present

## 2023-03-08 DIAGNOSIS — Z515 Encounter for palliative care: Secondary | ICD-10-CM

## 2023-03-08 DIAGNOSIS — I5033 Acute on chronic diastolic (congestive) heart failure: Secondary | ICD-10-CM | POA: Diagnosis not present

## 2023-03-08 DIAGNOSIS — Z7189 Other specified counseling: Secondary | ICD-10-CM | POA: Diagnosis not present

## 2023-03-08 DIAGNOSIS — J810 Acute pulmonary edema: Secondary | ICD-10-CM

## 2023-03-08 LAB — IRON AND TIBC
Iron: 28 ug/dL — ABNORMAL LOW (ref 45–182)
Saturation Ratios: 11 % — ABNORMAL LOW (ref 17.9–39.5)
TIBC: 267 ug/dL (ref 250–450)
UIBC: 239 ug/dL

## 2023-03-08 LAB — FOLATE: Folate: 38 ng/mL (ref >5.9)

## 2023-03-08 LAB — VITAMIN B12: Vitamin B-12: 934 pg/mL — ABNORMAL HIGH (ref 180–914)

## 2023-03-08 LAB — RENAL FUNCTION PANEL
Albumin: 2.5 g/dL — ABNORMAL LOW (ref 3.5–5.0)
Anion gap: 7 (ref 5–15)
BUN: 42 mg/dL — ABNORMAL HIGH (ref 8–23)
CO2: 30 mmol/L (ref 22–32)
Calcium: 8.6 mg/dL — ABNORMAL LOW (ref 8.9–10.3)
Chloride: 98 mmol/L (ref 98–111)
Creatinine, Ser: 2.19 mg/dL — ABNORMAL HIGH (ref 0.61–1.24)
GFR, Estimated: 30 mL/min — ABNORMAL LOW (ref >60)
Glucose, Bld: 117 mg/dL — ABNORMAL HIGH (ref 70–99)
Phosphorus: 3.5 mg/dL (ref 2.5–4.6)
Potassium: 4 mmol/L (ref 3.5–5.1)
Sodium: 135 mmol/L (ref 135–145)

## 2023-03-08 LAB — CBC
HCT: 26.8 % — ABNORMAL LOW (ref 39.0–52.0)
Hemoglobin: 8.2 g/dL — ABNORMAL LOW (ref 13.0–17.0)
MCH: 30.5 pg (ref 26.0–34.0)
MCHC: 30.6 g/dL (ref 30.0–36.0)
MCV: 99.6 fL (ref 80.0–100.0)
Platelets: 145 10*3/uL — ABNORMAL LOW (ref 150–400)
RBC: 2.69 MIL/uL — ABNORMAL LOW (ref 4.22–5.81)
RDW: 20.5 % — ABNORMAL HIGH (ref 11.5–15.5)
WBC: 6.7 10*3/uL (ref 4.0–10.5)
nRBC: 0 % (ref 0.0–0.2)

## 2023-03-08 MED ORDER — POLYSACCHARIDE IRON COMPLEX 150 MG PO CAPS
150.0000 mg | ORAL_CAPSULE | Freq: Every day | ORAL | Status: DC
  Administered 2023-03-08 – 2023-03-15 (×8): 150 mg via ORAL
  Filled 2023-03-08 (×9): qty 1

## 2023-03-08 MED ORDER — FUROSEMIDE 10 MG/ML IJ SOLN
40.0000 mg | Freq: Every day | INTRAMUSCULAR | Status: DC
  Administered 2023-03-09: 40 mg via INTRAVENOUS
  Filled 2023-03-08: qty 4

## 2023-03-08 NOTE — Consult Note (Addendum)
Consultation Note Date: 03/08/2023   Patient Name: John [name]  DOB: November 13, 1946  MRN: [id number]  Age / Sex: 77 y.o., male  PCP: Alisa Graff, FNP Referring Physician: Fritzi Mandes, MD  Reason for Consultation: Establishing goals of care   HPI/Brief Hospital Course: 77 y.o. male  with past medical history of HTN, CKD stage IV, chronic diastolic and systolic heart failure with cor pulmonale, CAD, COPD with chronic hypoxemia baseline 3L [place] supplemental oxygen, chronic thrombocytopenia, recurrent lower extremity cellulitis brought in be EMS from [facility] on  03/05/2023 with shortness of breath and presumed volume overload. Losartan, metolazone and torsemide placed on hold at discharge 3/19.  Initial BNP 2659  Noted recent admission from 2/14-3/19 due to hypovolemic and cardiogenic shock in the setting of bleeding duodenal ulcer. Required mechanical ventilation, CRRT, vasopressor support and HD during that admission.  Noted 4 inpatient admissions in 6 months.  Palliative medicine was consulted for assisting with goals of care conversations. Confirming code status as DNR was in place at discharge 3/19.  Subjective:  Extensive chart review has been completed prior to meeting patient including labs, vital signs, imaging, progress notes, orders, and available advanced directive documents from current and previous encounters.  Introduced myself as a Designer, jewellery as a member of the palliative care team. Explained palliative medicine is specialized medical care for people living with serious illness. It focuses on providing relief from the symptoms and stress of a serious illness. The goal is to improve quality of life for both the patient and the family.   Met with Mr. Caraker at his bedside. Acknowledged recognition from visits had from recent previous admission. During exam, Mr. Outen moaning and complaining of left lower leg pain (known  chronic wound present)-nursing staff made aware and will administer pain medication.  Mr. Stefanovic questioning reasoning for admission. Reviewed documented symptoms of shortness of breath and increased swelling. Explained likely from exacerbation from CHF. Mr. Meyerhofer concerned he is going to need dialysis again. Confirmed he would want dialysis this admission if necessary.   Attempted to illicit goals of care. Discussed code status. Full Code versus Do Not Resuscitate measures were discussed. Referenced his conversations from previous admission. Mr. Johnson became irritated during goals of care conversations. Noted again he is "tired of having these conversations." He states he wishes to remain Full Code. States, "whatever is going to happen is going to happen." Attempted to discuss expected disease trajectory of both CHF and CKD. During conversation, Mr. Markunas closed his eyes and stopped engaging.  Again, stressed the importance of establishing HCPOA. Previous conversations with Nancy-long term significant other not interested in becoming legal HCPOA.  I discussed importance of continued conversations with family/support persons and all members of their medical team regarding overall plan of care and treatment options ensuring decisions are in alignment with patients goals of care.  All questions/concerns addressed. Emotional support provided to patient/family/support persons.  Objective: Primary Diagnoses: Present on Admission:  Acute on chronic diastolic CHF (congestive heart failure) (HCC)  CKD (chronic kidney disease), stage IV (HCC)  Hypertension  COPD (chronic obstructive pulmonary disease) (HCC)  Macrocytic anemia  Duodenal ulcer  Open wound_in posterior left lower leg   Physical Exam  Vital Signs: BP 99/62 (BP Location: Left Arm)   Pulse 95   Temp 97.7 F (36.5 C) (Oral)   Resp 20   Ht 5\' 11"  (1.803 m)   Wt 83.8 kg   SpO2 91%   BMI 25.76 kg/m  Pain  Scale:  0-10 POSS *See Group Information*: S-Acceptable,Sleep, easy to arouse Pain Score: 8    LBM: Last BM Date :  (PTA) Baseline Weight: Weight: 84 kg Most recent weight: Weight: 83.8 kg       Palliative Assessment/Data: 40%   Assessment and Plan  SUMMARY OF RECOMMENDATIONS   Full Code-Full Scope Recommend establishing HCPOA PMT to sign off as goals remain clear at this time Recommend outpatient palliative care services  Discussed With: Discussed with Dr. Posey Pronto, at this time goals are clear, PMT to sign off with primary team in agreement. Please reengage if needs arise.   Thank you for this consult and allowing Palliative Medicine to participate in the care of [name]. Palliative medicine will continue to follow and assist as needed.   Time Total: 55 minutes  Time spent includes: Detailed review of medical records (labs, imaging, vital signs), medically appropriate exam (mental status, respiratory, cardiac, skin), discussed with treatment team, counseling and educating patient, family and staff, documenting clinical information, medication management and coordination of care.   Signed by: Theodoro Grist, DNP, AGNP-C Palliative Medicine    Please contact Palliative Medicine Team phone at [phone] for questions and concerns.  For individual provider: See Shea Evans

## 2023-03-08 NOTE — Progress Notes (Signed)
[place] at [place] NAME: John [name]    MR#:  [id number]  DATE OF BIRTH:  1946-10-27  SUBJECTIVE:  no family in the ER. Not the best historian. Gets irritable when I ask questions.does not want to talk No resp distress , good uop  VITALS:  Blood pressure (!) 84/59, pulse (!) 101, temperature 97.9 F (36.6 C), temperature source Oral, resp. rate 16, height 5\' 11"  (1.803 m), weight 83.8 kg, SpO2 90 %.  PHYSICAL EXAMINATION:   GENERAL:  77 y.o.-year-old patient with mild acute distress. Chronically ill LUNGS: decreased breath sounds bilaterally, no wheezing CARDIOVASCULAR: S1, S2 normal. ABDOMEN: Soft, nontender, nondistended. EXTREMITIES: chronic leg edema with chronic skin ulcer   NEUROLOGIC: nonfocal  patient is awake SKIN   LABORATORY PANEL:  CBC Recent Labs  Lab 03/08/23 0535  WBC 6.7  HGB 8.2*  HCT 26.8*  PLT 145*     Chemistries  Recent Labs  Lab 03/07/23 0713 03/08/23 0535  NA 137 135  K 4.3 4.0  CL 96* 98  CO2 33* 30  GLUCOSE 102* 117*  BUN 36* 42*  CREATININE 2.12* 2.19*  CALCIUM 9.8 8.6*  MG 1.8  --      RADIOLOGY:  No results found.  Assessment and Plan   John [name] is a 77 y.o. male with medical history significant of hypertension, hyperlipidemia, COPD on 3 L oxygen, diastolic CHF CKD stage IV, duodenal ulcer with bleeding, recent admission due to hypovolemic and cardiogenic shock, who presents with shortness of breath.   Patient was recently hospitalized from 2/14 - 3/19 due to hypovolemic and cardiogenic shock in the setting of duodenal ulcer bleeding. His losartan, metolazone and torsemide were on hold at discharge. Pt states that he developed shortness of breath since yesterday, which has been progressively worsening.  pt was found to have BNP 2659,   Acute on chronic diastolic CHF (congestive heart failure) ([place]): 2D echo on 01/30/2023 showed EF of 50-55% with grade 1 diastolic  dysfunction.  Due to recent hypovolemic shock, patient's diuretics were on hold.  He developed bilateral leg edema, shortness breath, positive JVD, elevated BNP 2659, clinically consistent with CHF exacerbation.  -Lasix 40 mg bid by IV--good uop--change to qd dosing -Daily weights -strict I/O's -Low salt diet -Fluid restriction  CKD (chronic kidney disease), stage IV ([place]): Recent baseline creatinine 2.21 on 03/04/2023 at discharge.  Today his creatinine is 2.13, BUN 32, GFR 31, close to baseline. -Renal function closely with BMP --Dr Juleen China input appreciated --pt does not need HD for now   Hypertension: Blood pressure 104/82 -IV hydralazine as needed -Patient is on IV Lasix as above   COPD (chronic obstructive pulmonary disease) (HCC) -Bronchodilators   Macrocytic anemia: Hemoglobin 8.9 today (8.9 03/04/2023 at the discharge) -f/u by CBC   Duodenal ulcer -continue protonix 40 mg bid   Open wound_in posterior left lower leg: pt has mild leukocytosis with WBC 11.0.  No fever.  Clinically not septic. -wound care consult. --hold off abxs   Pallaitive care input noted--pt is wanting to be FULL code. Overall poor prognosis   Procedures: Family communication :no family in ER Consults :nephrology CODE STATUS: Full DVT Prophylaxis :SCD Level of care: Progressive Status is: Inpatient Remains inpatient appropriate because: CHF  Overall poor prognosis.    TOTAL TIME TAKING CARE OF THIS PATIENT: 35 minutes.  >50% time spent on counselling and coordination of care  Note: This dictation was prepared with Dragon dictation  along with smaller phrase technology. Any transcriptional errors that result from this process are unintentional.  Fritzi Mandes M.D    [facility]   CC: Primary care physician; Alisa Graff, FNP

## 2023-03-08 NOTE — Progress Notes (Signed)
[name]  PROGRESS NOTE   Subjective:   Presented with shortness of breath after being discharged on 3./19.   Patient was found to have acute exacerbation of congestive heart failure and started on IV furosemide.   UOP 1725 mL.   Does not feel like eating yet this morning.   Objective:  Vital signs: Blood pressure 99/62, pulse 95, temperature 97.7 F (36.5 C), temperature source Oral, resp. rate 20, height 5\' 11"  (1.803 m), weight 83.8 kg, SpO2 91 %.  Intake/Output Summary (Last 24 hours) at 03/08/2023 1140 Last data filed at 03/08/2023 0156 Gross per 24 hour  Intake 240 ml  Output 1725 ml  Net -1485 ml    Filed Weights   03/05/23 1121 03/07/23 0928  Weight: 84 kg 83.8 kg     Physical Exam: General:  No acute distress, resting comfortably   Head:  Normocephalic, atraumatic. Moist oral mucosal membranes  Eyes:  Anicteric  Lungs:   Diminished bilaterally, 4L [place]   Heart:  Tachycardic   Abdomen:   Soft, nontender, bowel sounds present  Extremities: Trace peripheral edema.  Neurologic:  Awake, alert, following commands  Skin:  No lesions  Access: None    Basic Metabolic Panel: Recent Labs  Lab 03/02/23 0411 03/02/23 0412 03/03/23 0428 03/04/23 0537 03/05/23 1228 03/06/23 0525 03/07/23 0713 03/08/23 0535  NA 137 135 136 139 138 135 137 135  K 4.5 4.5 4.7 4.9 5.0 4.6 4.3 4.0  CL 104 103 101 102 101 104 96* 98  CO2 28 28 30 28 29 27  33* 30  GLUCOSE 93 93 102* 107* 101* 90 102* 117*  BUN 30* 29* 31* 32* 32* 29* 36* 42*  CREATININE 1.83* 1.86* 1.96* 2.21* 2.13* 2.03* 2.12* 2.19*  CALCIUM 8.8* 8.7* 8.8* 8.9 9.2 8.8* 9.8 8.6*  MG 1.7  --  1.8 2.0  --  1.8 1.8  --   PHOS  --  2.6 3.1 4.3  --   --   --  3.5    GFR: Estimated Creatinine Clearance: 30.1 mL/min (A) (by C-G formula based on SCr of 2.19 mg/dL (H)).  Liver Function Tests: Recent Labs  Lab 03/02/23 0412 03/03/23 0428 03/04/23 0537 03/08/23 0535  ALBUMIN 2.5* 2.6* 2.7* 2.5*     No results for input(s): "LIPASE", "AMYLASE" in the last 168 hours. No results for input(s): "AMMONIA" in the last 168 hours.  CBC: Recent Labs  Lab 03/02/23 0411 03/04/23 0537 03/05/23 1228 03/06/23 0525 03/08/23 0535  WBC 9.3 11.7* 11.0* 10.0 6.7  NEUTROABS 7.7 9.9*  --   --   --   HGB 8.5* 8.9* 8.9* 8.6* 8.2*  HCT 27.9* 29.8* 29.6* 28.9* 26.8*  MCV 98.9 101.7* 102.8* 104.3* 99.6  PLT 158 184 196 170 145*      HbA1C: Hgb A1c MFr Bld  Date/Time Value Ref Range Status  10/02/2022 11:16 PM 6.3 (H) 4.8 - 5.6 % Final    Comment:    (NOTE) Pre diabetes:          5.7%-6.4%  Diabetes:              >6.4%  Glycemic control for   <7.0% adults with diabetes     Urinalysis: No results for input(s): "COLORURINE", "LABSPEC", "PHURINE", "GLUCOSEU", "HGBUR", "BILIRUBINUR", "KETONESUR", "PROTEINUR", "UROBILINOGEN", "NITRITE", "LEUKOCYTESUR" in the last 72 hours.  Invalid input(s): "APPERANCEUR"    Imaging: No results found.   Medications:      ascorbic acid  500 mg Oral BID  bacitracin   Topical BID   calcitRIOL  0.25 mcg Oral Daily   feeding supplement  237 mL Oral TID BM   [START ON 03/09/2023] furosemide  40 mg Intravenous Daily   iron polysaccharides  150 mg Oral Daily   multivitamin  1 tablet Oral QHS   pantoprazole  40 mg Oral BID    Assessment/ Plan:     John [name] is a 77 y.o. black male with chronic diastolic congestive heart failure, hypertension, COPD, hyperlipidemia, who is admitted to [facility] on [facility]/05/2023 for Acute pulmonary edema (HCC) [J81.0] Acute on chronic diastolic CHF (congestive heart failure) (HCC) [I50.33] Acute on chronic respiratory failure with hypoxia (HCC) [J96.21] Acute on chronic congestive heart failure, unspecified heart failure type ([place]) [I50.9]   #1: Chronic kidney disease stage IIIB: creatinine at baseline 2.19, GFR of 31. Patient did require dialysis on last admission due to acute cardiorenal syndrome, GI bleed and then  with hypovolemic shock. UOP responding to IV lasix. No hemodialysis at this time. Patient is followed by Dr. Floyce Stakes. Holley Raring as an outpatient.  IV lasix frequency decrease from twice daily to once daily.     #2: Acute respiratory failure secondary to acute exacerbation of chronic diastolic congestive heart failure. Patient's admission weight is approximately 4 kg above his discharge weight from earlier in the week. Continue furosemide. Continue supplemental oxygen.    #3: Hypotension: no longer on blood pressure agent. Continue to monitor.   #4: Anemia with chronic kidney disease and acute blood loss with recent GI bleed. Hemoglobin is stable however low. Macrocytic. Hgb 8.2, trending downward from 8.6.    LOS: [facility] 3/23/202411:40 AM

## 2023-03-09 DIAGNOSIS — I5033 Acute on chronic diastolic (congestive) heart failure: Secondary | ICD-10-CM | POA: Diagnosis not present

## 2023-03-09 LAB — BASIC METABOLIC PANEL
Anion gap: 9 (ref 5–15)
BUN: 44 mg/dL — ABNORMAL HIGH (ref 8–23)
CO2: 30 mmol/L (ref 22–32)
Calcium: 8.9 mg/dL (ref 8.9–10.3)
Chloride: 95 mmol/L — ABNORMAL LOW (ref 98–111)
Creatinine, Ser: 2.46 mg/dL — ABNORMAL HIGH (ref 0.61–1.24)
GFR, Estimated: 26 mL/min — ABNORMAL LOW (ref >60)
Glucose, Bld: 108 mg/dL — ABNORMAL HIGH (ref 70–99)
Potassium: 4.1 mmol/L (ref 3.5–5.1)
Sodium: 134 mmol/L — ABNORMAL LOW (ref 135–145)

## 2023-03-09 NOTE — Progress Notes (Signed)
[place] at [place] NAME: John [name]    MR#:  [id number]  DATE OF BIRTH:  11-Feb-1946  SUBJECTIVE:   Not the best historian. Gets irritable when I ask questions.does not want to talk each time No resp distress , good uop Agreeable to get IV  VITALS:  Blood pressure 106/66, pulse (!) 103, temperature 97.8 F (36.6 C), resp. rate 16, height 5\' 11"  (1.803 m), weight 83.8 kg, SpO2 (!) 87 %.  PHYSICAL EXAMINATION:   GENERAL:  77 y.o.-year-old patient with mild acute distress. Chronically ill LUNGS: decreased breath sounds bilaterally, no wheezing CARDIOVASCULAR: S1, S2 normal. ABDOMEN: Soft, nontender, nondistended. EXTREMITIES: chronic leg edema with chronic skin ulcer   NEUROLOGIC: nonfocal  patient is awake SKIN   LABORATORY PANEL:  CBC Recent Labs  Lab 03/08/23 0535  WBC 6.7  HGB 8.2*  HCT 26.8*  PLT 145*     Chemistries  Recent Labs  Lab 03/07/23 0713 03/08/23 0535 03/09/23 0943  NA 137   < > 134*  K 4.3   < > 4.1  CL 96*   < > 95*  CO2 33*   < > 30  GLUCOSE 102*   < > 108*  BUN 36*   < > 44*  CREATININE 2.12*   < > 2.46*  CALCIUM 9.8   < > 8.9  MG 1.8  --   --    < > = values in this interval not displayed.     RADIOLOGY:  No results found.  Assessment and Plan   John [name] is a 77 y.o. male with medical history significant of hypertension, hyperlipidemia, COPD on 3 L oxygen, diastolic CHF CKD stage IV, duodenal ulcer with bleeding, recent admission due to hypovolemic and cardiogenic shock, who presents with shortness of breath.   Patient was recently hospitalized from 2/14 - 3/19 due to hypovolemic and cardiogenic shock in the setting of duodenal ulcer bleeding. His losartan, metolazone and torsemide were on hold at discharge. Pt states that he developed shortness of breath since yesterday, which has been progressively worsening.  pt was found to have BNP 2659,   Acute on chronic diastolic CHF  (congestive heart failure) ([place]): 2D echo on 01/30/2023 showed EF of 50-55% with grade 1 diastolic dysfunction.  Due to recent hypovolemic shock, patient's diuretics were on hold.  He developed bilateral leg edema, shortness breath, positive JVD, elevated BNP 2659, clinically consistent with CHF exacerbation.  -Lasix 40 mg bid by IV--good uop--change to qd dosing--creat 2.46--hold lasix -Daily weights -strict I/O's -Low salt diet -Fluid restriction  CKD (chronic kidney disease), stage IV ([place]): Recent baseline creatinine 2.21 on 03/04/2023 at discharge.   BUN 32, GFR 31, close to baseline. -Renal function closely with BMP --Dr Juleen China input appreciated --pt does not need HD for now --creat upto 2.46--hold lasix   Hypertension: Blood pressure 104/82 -IV hydralazine as needed   COPD (chronic obstructive pulmonary disease) (HCC) -Bronchodilators   Macrocytic anemia: Hemoglobin 8.2 (8.9 03/04/2023 at the discharge)   Duodenal ulcer -continue protonix 40 mg bid   Open wound_in posterior left lower leg: pt has mild leukocytosis with WBC 11.0.  No fever.  Clinically not septic. -wound care consult. --hold off abxs   Pallaitive care input noted--pt is wanting to be FULL code. Overall poor prognosis  Per TOC pt [facility] communication :none today Consults :nephrology CODE STATUS: Full DVT Prophylaxis :SCD Level of care: Progressive  Status is: Inpatient Remains inpatient appropriate because: CHF  Overall long term poor prognosis. Pt seen by Palliative care. Will have them f/u as out pt    TOTAL TIME TAKING CARE OF THIS PATIENT: 35 minutes.  >50% time spent on counselling and coordination of care  Note: This dictation was prepared with Dragon dictation along with smaller phrase technology. Any transcriptional errors that result from this process are unintentional.  Fritzi Mandes M.D    [facility]   CC: Primary care physician; Alisa Graff,  FNP

## 2023-03-09 NOTE — Progress Notes (Signed)
[name]  PROGRESS NOTE   Subjective:   Presented with shortness of breath after being discharged on 3./19.   Patient was found to have acute exacerbation of congestive heart failure and started on IV furosemide.   UOP 400 mL.   Patient states that he is not currently in any pain unless someone touches his feet but that the "patch" on his back bothers him. States it has been there for years.   Objective:  Vital signs: Blood pressure 93/65, pulse 92, temperature 98.6 F (37 C), temperature source Oral, resp. rate 18, height 5\' 11"  (1.803 m), weight 83.8 kg, SpO2 (!) 85 %.  Intake/Output Summary (Last 24 hours) at 03/09/2023 1110 Last data filed at 03/09/2023 0500 Gross per 24 hour  Intake 480 ml  Output 400 ml  Net 80 ml    Filed Weights   03/05/23 1121 03/07/23 0928  Weight: 84 kg 83.8 kg     Physical Exam: General:  No acute distress, resting comfortably   Head:  Normocephalic, atraumatic. Moist oral mucosal membranes  Eyes:  Anicteric  Lungs:   Diminished bilaterally  Heart:  Regular   Abdomen:   Soft, nontender, bowel sounds present  Extremities: + peripheral edema.  Neurologic:  Awake, alert, following commands  Skin:  No lesions  Access: None    Basic Metabolic Panel: Recent Labs  Lab 03/03/23 0428 03/04/23 0537 03/05/23 1228 03/06/23 0525 03/07/23 0713 03/08/23 0535 03/09/23 0943  NA 136 139 138 135 137 135 134*  K 4.7 4.9 5.0 4.6 4.3 4.0 4.1  CL 101 102 101 104 96* 98 95*  CO2 30 28 29 27  33* 30 30  GLUCOSE 102* 107* 101* 90 102* 117* 108*  BUN 31* 32* 32* 29* 36* 42* 44*  CREATININE 1.96* 2.21* 2.13* 2.03* 2.12* 2.19* 2.46*  CALCIUM 8.8* 8.9 9.2 8.8* 9.8 8.6* 8.9  MG 1.8 2.0  --  1.8 1.8  --   --   PHOS 3.1 4.3  --   --   --  3.5  --     GFR: Estimated Creatinine Clearance: 26.8 mL/min (A) (by C-G formula based on SCr of 2.46 mg/dL (H)).  Liver Function Tests: Recent Labs  Lab 03/03/23 0428 03/04/23 0537 03/08/23 0535   ALBUMIN 2.6* 2.7* 2.5*    No results for input(s): "LIPASE", "AMYLASE" in the last 168 hours. No results for input(s): "AMMONIA" in the last 168 hours.  CBC: Recent Labs  Lab 03/04/23 0537 03/05/23 1228 03/06/23 0525 03/08/23 0535  WBC 11.7* 11.0* 10.0 6.7  NEUTROABS 9.9*  --   --   --   HGB 8.9* 8.9* 8.6* 8.2*  HCT 29.8* 29.6* 28.9* 26.8*  MCV 101.7* 102.8* 104.3* 99.6  PLT 184 196 170 145*      HbA1C: Hgb A1c MFr Bld  Date/Time Value Ref Range Status  10/02/2022 11:16 PM 6.3 (H) 4.8 - 5.6 % Final    Comment:    (NOTE) Pre diabetes:          5.7%-6.4%  Diabetes:              >6.4%  Glycemic control for   <7.0% adults with diabetes     Urinalysis: No results for input(s): "COLORURINE", "LABSPEC", "PHURINE", "GLUCOSEU", "HGBUR", "BILIRUBINUR", "KETONESUR", "PROTEINUR", "UROBILINOGEN", "NITRITE", "LEUKOCYTESUR" in the last 72 hours.  Invalid input(s): "APPERANCEUR"    Imaging: No results found.   Medications:      ascorbic acid  500 mg Oral BID  bacitracin   Topical BID   calcitRIOL  0.25 mcg Oral Daily   feeding supplement  237 mL Oral TID BM   furosemide  40 mg Intravenous Daily   iron polysaccharides  150 mg Oral Daily   multivitamin  1 tablet Oral QHS   pantoprazole  40 mg Oral BID    Assessment/ Plan:     John [name] is a 77 y.o. black male with chronic diastolic congestive heart failure, hypertension, COPD, hyperlipidemia, who is admitted to [facility] on 03/05/2023 for Acute pulmonary edema (HCC) [J81.0] Acute on chronic diastolic CHF (congestive heart failure) (HCC) [I50.33] Acute on chronic respiratory failure with hypoxia (HCC) [J96.21] Acute on chronic congestive heart failure, unspecified heart failure type ([place]) [I50.9]   #1: Chronic kidney disease stage IIIB: creatinine baseline 2.19, GFR of 31. Creatinine slightly increased from yesterday to 2.46. Patient did require dialysis on last admission due to acute cardiorenal syndrome, GI bleed  and then with hypovolemic shock.No hemodialysis at this time. Patient is followed by Dr. Floyce Stakes. Holley Raring as an outpatient.  IV lasix frequency decrease from twice daily to once daily.  UOP decrease from previous day. Lasix was decreased from twice daily to once daily    #2: Acute respiratory failure secondary to acute exacerbation of chronic diastolic congestive heart failure. Patient's admission weight is approximately 4 kg above his discharge weight from earlier in the week. Continue furosemide. Continue supplemental oxygen.    #3: Hypotension: no longer on blood pressure agent. Continue to monitor.   #4: Anemia with chronic kidney disease and acute blood loss with recent GI bleed. Hemoglobin is stable however low. Macrocytic. Hgb 8.2, trending downward from 8.6   LOS: [facility] 3/24/202411:10 AM

## 2023-03-09 NOTE — TOC Progression Note (Signed)
Transition of Care [facility]) - Progression Note    Patient Details  Name: Branch Pelly MRN: [id number] Date of Birth: 03-Oct-1946  Transition of Care [facility]) CM/SW Contact  Izola Price, RN Phone Number: 03/09/2023, 1:30 PM  Clinical Narrative:  03/08/23: One bed offer from [facility]. To begin authorization but need updated therapy notes. Contacted [employer] and received Ref [id number] to start authorization, as old one remained from [facility]. Fax clinicals to [phone] when therapy notes ready. Updated provider to write new order to evaluation. Simmie Davies RN CM    Expected Discharge Plan: Skilled Nursing Facility Barriers to Discharge: Continued Medical Work up  Expected Discharge Plan and Services       Living arrangements for the past 2 months: Single Family Home                                       Social Determinants of Health (SDOH) Interventions SDOH Screenings   Food Insecurity: No Food Insecurity (01/29/2023)  Housing: Low Risk  (01/29/2023)  Transportation Needs: No Transportation Needs (01/29/2023)  Utilities: Not At Risk (01/29/2023)  Alcohol Screen: Low Risk  (07/17/2022)  Depression (PHQ2-9): Low Risk  (07/30/2022)  Financial Resource Strain: Low Risk  (07/17/2022)  Physical Activity: Sufficiently Active (07/17/2022)  Social Connections: Socially Integrated (07/17/2022)  Stress: No Stress Concern Present (07/17/2022)  Tobacco Use: Medium Risk (03/06/2023)    Readmission Risk Interventions    01/30/2023   11:09 AM  Readmission Risk Prevention Plan  Transportation Screening Complete  Medication Review ([place]) Complete  PCP or Specialist appointment within 3-5 days of discharge Complete  HRI or [place] Complete  SW Recovery Care/Counseling Consult Complete  [place] Not Applicable

## 2023-03-10 DIAGNOSIS — I5033 Acute on chronic diastolic (congestive) heart failure: Secondary | ICD-10-CM | POA: Diagnosis not present

## 2023-03-10 LAB — PARATHYROID HORMONE, INTACT (NO CA): PTH: 65 pg/mL (ref 15–65)

## 2023-03-10 MED ORDER — GABAPENTIN 100 MG PO CAPS
100.0000 mg | ORAL_CAPSULE | Freq: Two times a day (BID) | ORAL | Status: DC
  Administered 2023-03-10 – 2023-03-15 (×12): 100 mg via ORAL
  Filled 2023-03-10 (×14): qty 1

## 2023-03-10 NOTE — Consult Note (Signed)
   [facility] Inpatient Consult   03/10/2023  Kimsey Velardo [birth year] [id number]  Orientation with Natividad Brood, [facility] Liaison for review.   Location: [facility] Liaison  screened remotely [facility]).   [place] [facility]) [place] Patient: Insurance [facility])    Primary Care Provider:  Dr. Tracie Harrier with [facility]   Patient screened with 7 and 30 days readmission hospitalization with noted extreme high risk score for unplanned readmission and 4 IP with 6 months.  Pt currently active with [facility] RN (will alert) for ongoing post hospital discharged for care coordination services   Plan:  [facility] RN will continue to follow ongoing disposition to assess for post hospital community care coordination/management needs.     [place] does not replace or interfere with any arrangements made by the Inpatient Transition of Care team.   For questions contact:    Raina Mina, RN, [place] Hours M-F 8:00 am to 5 pm [phone] mobile [phone] [Office toll free line][facility] Office Hours are M-F 8:30 - 5 pm 24 hour nurse advise line [phone] [email or web address]  [email or web address]

## 2023-03-10 NOTE — Progress Notes (Addendum)
[facility]  - [place] at [facility]   PATIENT NAME: John [name]    MR#:  [id number]  DATE OF BIRTH:  24-Sep-1946  SUBJECTIVE:   Not the best historian.  OOB Quiet sleepy No cp. Breathing stable VITALS:  Blood pressure (!) 87/70, pulse 90, temperature 97.6 F (36.4 C), resp. rate 16, height 5\' 11"  (1.803 m), weight 87.3 kg, SpO2 100 %.  PHYSICAL EXAMINATION:   GENERAL:  77 y.o.-year-old patient with mild acute distress. Chronically ill LUNGS: decreased breath sounds bilaterally, no wheezing CARDIOVASCULAR: S1, S2 normal. ABDOMEN: Soft, nontender, nondistended. EXTREMITIES: chronic leg edema with chronic skin ulcer   NEUROLOGIC: nonfocal  patient is awake SKIN   LABORATORY PANEL:  CBC Recent Labs  Lab 03/08/23 0535  WBC 6.7  HGB 8.2*  HCT 26.8*  PLT 145*     Chemistries  Recent Labs  Lab 03/07/23 0713 03/08/23 0535 03/09/23 0943  NA 137   < > 134*  K 4.3   < > 4.1  CL 96*   < > 95*  CO2 33*   < > 30  GLUCOSE 102*   < > 108*  BUN 36*   < > 44*  CREATININE 2.12*   < > 2.46*  CALCIUM 9.8   < > 8.9  MG 1.8  --   --    < > = values in this interval not displayed.     RADIOLOGY:  No results found.  Assessment and Plan   John [name] is a 78 y.o. male with medical history significant of hypertension, hyperlipidemia, COPD on 3 L oxygen, diastolic CHF CKD stage IV, duodenal ulcer with bleeding, recent admission due to hypovolemic and cardiogenic shock, who presents with shortness of breath.   Patient was recently hospitalized from 2/14 - 3/19 due to hypovolemic and cardiogenic shock in the setting of duodenal ulcer bleeding. His losartan, metolazone and torsemide were on hold at discharge. Pt states that he developed shortness of breath since yesterday, which has been progressively worsening.  pt was found to have BNP 2659,   Acute on chronic diastolic CHF (congestive heart failure) (HCC): 2D echo on 01/30/2023 showed EF of 50-55% with  grade 1 diastolic dysfunction.  Due to recent hypovolemic shock, patient's diuretics were on hold.  He developed bilateral leg edema, shortness breath, positive JVD, elevated BNP 2659, clinically consistent with CHF exacerbation.  -Lasix 40 mg bid by IV--good uop--change to qd dosing--creat 2.46--hold lasix--2.39--BP soft -Daily weights -strict I/O's -Low salt diet -BP soft--start midodrine  CKD (chronic kidney disease), stage IV (HCC): Recent baseline creatinine 2.21 on 03/04/2023 at discharge.   BUN 32, GFR 31, close to baseline. -Renal function closely with BMP --Dr Wynelle Link input appreciated --pt does not need HD for now --creat upto 2.46--hold lasix   Hypertension: Blood pressure 104/82 -IV hydralazine as needed   COPD (chronic obstructive pulmonary disease) (HCC) -Bronchodilators   Macrocytic anemia: Hemoglobin 8.2 (8.9 03/04/2023 at the discharge)   Duodenal ulcer -continue protonix 40 mg bid   Open wound_in posterior left lower leg: pt has mild leukocytosis with WBC 11.0.  No fever.  Clinically not septic. -wound care consult. --hold off abxs   Pallaitive care input noted--pt is wanting to be FULL code. Overall poor prognosis  Per TOC pt has bed [facility] communication :none today Consults :nephrology CODE STATUS: Full DVT Prophylaxis :SCD Level of care: Progressive Status is: Inpatient Remains inpatient appropriate because: CHF  Overall long term poor prognosis.  Pt seen by Palliative care. Will have them f/u as out pt Pt has a very poor insight    TOTAL TIME TAKING CARE OF THIS PATIENT: 35 minutes.  >50% time spent on counselling and coordination of care  Note: This dictation was prepared with Dragon dictation along with smaller phrase technology. Any transcriptional errors that result from this process are unintentional.  Enedina Finner M.D    [facility]   CC: Primary care physician; Delma Freeze, FNP

## 2023-03-10 NOTE — Care Management Important Message (Signed)
Important Message  Patient Details  Name: John [name] MRN: [id number] Date of Birth: 01/06/1946   Medicare Important Message Given:  Yes     [name] 03/10/2023, 12:47 PM

## 2023-03-10 NOTE — Progress Notes (Signed)
[name]  PROGRESS NOTE   Subjective:   Patient seen sitting up in chair, just completed PT session Partially completed breakfast tray at bedside Patient remains on 4 L nasal cannula Trace lower extremity edema  Creatinine 2.46, up from 2.19.   Urine output 1 L recorded  Objective:  Vital signs: Blood pressure 99/76, pulse 96, temperature 97.6 F (36.4 C), resp. rate 20, height 5\' 11"  (1.803 m), weight 87.3 kg, SpO2 100 %.  Intake/Output Summary (Last 24 hours) at 03/10/2023 1114 Last data filed at 03/10/2023 1055 Gross per 24 hour  Intake 140 ml  Output 1050 ml  Net -910 ml    Filed Weights   03/05/23 1121 03/07/23 0928 03/10/23 0419  Weight: 84 kg 83.8 kg 87.3 kg     Physical Exam: General:  No acute distress, resting comfortably   Head:  Normocephalic, atraumatic. Moist oral mucosal membranes  Eyes:  Anicteric  Lungs:   Diminished bilaterally  Heart:  Regular   Abdomen:   Soft, nontender, bowel sounds present  Extremities: + peripheral edema.  Neurologic:  Awake, alert, following commands  Skin:  No lesions  Access: None    Basic Metabolic Panel: Recent Labs  Lab 03/04/23 0537 03/05/23 1228 03/06/23 0525 03/07/23 0713 03/08/23 0535 03/09/23 0943  NA 139 138 135 137 135 134*  K 4.9 5.0 4.6 4.3 4.0 4.1  CL 102 101 104 96* 98 95*  CO2 28 29 27  33* 30 30  GLUCOSE 107* 101* 90 102* 117* 108*  BUN 32* 32* 29* 36* 42* 44*  CREATININE 2.21* 2.13* 2.03* 2.12* 2.19* 2.46*  CALCIUM 8.9 9.2 8.8* 9.8 8.6* 8.9  MG 2.0  --  1.8 1.8  --   --   PHOS 4.3  --   --   --  3.5  --     GFR: Estimated Creatinine Clearance: 26.8 mL/min (A) (by C-G formula based on SCr of 2.46 mg/dL (H)).  Liver Function Tests: Recent Labs  Lab 03/04/23 0537 03/08/23 0535  ALBUMIN 2.7* 2.5*    No results for input(s): "LIPASE", "AMYLASE" in the last 168 hours. No results for input(s): "AMMONIA" in the last 168 hours.  CBC: Recent Labs  Lab 03/04/23 0537  03/05/23 1228 03/06/23 0525 03/08/23 0535  WBC 11.7* 11.0* 10.0 6.7  NEUTROABS 9.9*  --   --   --   HGB 8.9* 8.9* 8.6* 8.2*  HCT 29.8* 29.6* 28.9* 26.8*  MCV 101.7* 102.8* 104.3* 99.6  PLT 184 196 170 145*      HbA1C: Hgb A1c MFr Bld  Date/Time Value Ref Range Status  10/02/2022 11:16 PM 6.3 (H) 4.8 - 5.6 % Final    Comment:    (NOTE) Pre diabetes:          5.7%-6.4%  Diabetes:              >6.4%  Glycemic control for   <7.0% adults with diabetes     Urinalysis: No results for input(s): "COLORURINE", "LABSPEC", "PHURINE", "GLUCOSEU", "HGBUR", "BILIRUBINUR", "KETONESUR", "PROTEINUR", "UROBILINOGEN", "NITRITE", "LEUKOCYTESUR" in the last 72 hours.  Invalid input(s): "APPERANCEUR"    Imaging: No results found.   Medications:      ascorbic acid  500 mg Oral BID   bacitracin   Topical BID   calcitRIOL  0.25 mcg Oral Daily   feeding supplement  237 mL Oral TID BM   iron polysaccharides  150 mg Oral Daily   multivitamin  1 tablet Oral QHS  pantoprazole  40 mg Oral BID    Assessment/ Plan:     John [name] is a 77 y.o. black male with chronic diastolic congestive heart failure, hypertension, COPD, hyperlipidemia, who is admitted to [facility] on 03/05/2023 for Acute pulmonary edema (HCC) [J81.0] Acute on chronic diastolic CHF (congestive heart failure) (HCC) [I50.33] Acute on chronic respiratory failure with hypoxia (HCC) [J96.21] Acute on chronic congestive heart failure, unspecified heart failure type ([place]) [I50.9]   #1: Chronic kidney disease stage IIIB: creatinine baseline 2.19, GFR of 31. Creatinine slightly increased from yesterday to 2.46. Patient did require dialysis on last admission due to acute cardiorenal syndrome, GI bleed and then with hypovolemic shock.  Renal function continues to slowly increase.  IV diuresis held today.  Will consider transition to oral diuretics tomorrow.  Adequate urine output noted.  No acute indication for dialysis at this time.    #2: Acute respiratory failure secondary to acute exacerbation of chronic diastolic congestive heart failure. Patient's admission weight is approximately 4 kg above his discharge weight from earlier in the week.  IV furosemide currently held.  Continue supplemental oxygen.    #3: Hypotension: no longer on blood pressure agent.   Remains hypotensive, 99/76, asymptomatic.  #4: Anemia with chronic kidney disease and acute blood loss with recent GI bleed. Hemoglobin is stable however low.  Normocytic. Hgb 8.2   LOS: [facility] 3/25/202411:14 AM

## 2023-03-10 NOTE — TOC Progression Note (Signed)
Transition of Care [facility]) - Progression Note    Patient Details  Name: Uless Ogren MRN: [id number] Date of Birth: 02/19/1946  Transition of Care [facility]) CM/SW Contact  Laurena Slimmer, RN Phone Number: 03/10/2023, 12:58 PM  Clinical Narrative:    Damaris Schooner with Coralyn Mark from [facility]. The following being requested to be faxed to [phone] or be upload to [employer] portal by 3:30pm: Face sheet, H&P, MD progress note, PT eval and therapy note  Coralyn Mark was advised only PT eval has been completed. She stated that was acceptable.  Clinical upload and faxed.  Message sent in [place] advising clinical was sent.    Expected Discharge Plan: [place] Barriers to Discharge: Continued Medical Work up  Expected Discharge Plan and Services       Living arrangements for the past 2 months: Single Family Home                                       Social Determinants of Health (SDOH) Interventions SDOH Screenings   Food Insecurity: No Food Insecurity (01/29/2023)  Housing: Low Risk  (01/29/2023)  Transportation Needs: No Transportation Needs (01/29/2023)  Utilities: Not At Risk (01/29/2023)  Alcohol Screen: Low Risk  (07/17/2022)  Depression (PHQ2-9): Low Risk  (07/30/2022)  Financial Resource Strain: Low Risk  (07/17/2022)  Physical Activity: Sufficiently Active (07/17/2022)  Social Connections: Socially Integrated (07/17/2022)  Stress: No Stress Concern Present (07/17/2022)  Tobacco Use: Medium Risk (03/06/2023)    Readmission Risk Interventions    01/30/2023   11:09 AM  Readmission Risk Prevention Plan  Transportation Screening Complete  Medication Review ([place]) Complete  PCP or Specialist appointment within 3-5 days of discharge Complete  HRI or [place] Complete  SW Recovery Care/Counseling Consult Complete  [place] Not Applicable

## 2023-03-10 NOTE — Evaluation (Signed)
Physical Therapy Evaluation Patient Details Name: John [name] MRN: [id number] DOB: 1946/06/09 Today's Date: 03/10/2023  History of Present Illness  Pt is a 77 y/o M admitted on 03/05/23 after presenting with c/c of SOB. Pt was found to have O2 desaturation to 70-80s on home 3L O2. Pt also with painful wound on posterior LLE. Pt is being treated for acute on chronic diastolic CHF. Pt with recent admission 2/14-3/19 due to hypovolemic & cardiogenic shock in the setting of duodenal ulcer bleeding. PMH: HTN, HLD, COPD 3L O2, diastolic CHF, CKD stage IV, duodenal ulcer with bleeding  Clinical Impression  Pt seen for PT evaluation with pt requiring encouragement/education for participation. Pt presents to ED from SNF rehab. On this date, pt is able to complete bed mobility with supervision, extra time, & use of bed features but requires min<>mod assist for STS. Pt demonstrates decreased awareness of hand placement during sit<>stand & requires min assist for step pivot. Pt reports pain in BLE, but states "it's always" there. Recommend ongoing PT services to address deficits noted below.       Recommendations for follow up therapy are one component of a multi-disciplinary discharge planning process, led by the attending physician.  Recommendations may be updated based on patient status, additional functional criteria and insurance authorization.  Follow Up Recommendations Can patient physically be transported by private vehicle: No     Assistance Recommended at Discharge Frequent or constant Supervision/Assistance  Patient can return home with the following  Direct supervision/assist for medications management;Help with stairs or ramp for entrance;Assistance with feeding;Assist for transportation;Assistance with cooking/housework;Direct supervision/assist for financial management;A lot of help with walking and/or transfers;A lot of help with bathing/dressing/bathroom    Equipment Recommendations None  recommended by PT (TBD in next venue)  Recommendations for Other Services  OT consult    Functional Status Assessment Patient has had a recent decline in their functional status and demonstrates the ability to make significant improvements in function in a reasonable and predictable amount of time.     Precautions / Restrictions Precautions Precautions: Fall Restrictions Weight Bearing Restrictions: No      Mobility  Bed Mobility Overal bed mobility: Needs Assistance Bed Mobility: Supine to Sit     Supine to sit: Supervision, HOB elevated (extra time, cuin for initiation/technique, use of bed rails)          Transfers Overall transfer level: Needs assistance Equipment used: Rolling walker (2 wheels) Transfers: Sit to/from Stand Sit to Stand: Min assist, Mod assist (min<>mod assist for STS from slightly elevated EOB, decreased awareness of safe hand placement as pt pulls to stand with BUE on RW, requires extra time to initiate transfer)   Step pivot transfers: Min assist (extra time to weight shift & move RW)            Ambulation/Gait                  Stairs            Wheelchair Mobility    Modified Rankin (Stroke Patients Only)       Balance Overall balance assessment: Needs assistance Sitting-balance support: Feet supported, Bilateral upper extremity supported Sitting balance-Leahy Scale: Good     Standing balance support: Bilateral upper extremity supported, During functional activity, Reliant on assistive device for balance Standing balance-Leahy Scale: Fair  Pertinent Vitals/Pain Pain Assessment Pain Assessment: Faces Faces Pain Scale: Hurts even more Pain Location: BLE feet Pain Descriptors / Indicators: Guarding, Discomfort Pain Intervention(s): Monitored during session    Home Living Family/patient expects to be discharged to:: Skilled nursing facility Living Arrangements:  Spouse/significant other Available Help at Discharge: Family;Available 24 hours/day Type of Home: House Home Access: Stairs to enter   [employer] of Steps: 3   Home Layout: One level Home Equipment: Cane - quad;Standard Environmental consultant Additional Comments: Pt lives with [name], long time GF who is retired and available 24/7 per patient    Prior Function Prior Level of Function : Independent/Modified Independent;History of Falls (last six months)             Mobility Comments: Ind amb without an AD limited community distances, one fall in the last 6 months ADLs Comments: Pt reports Ind with self care and shared IADL tasks     Hand Dominance        Extremity/Trunk Assessment   Upper Extremity Assessment Upper Extremity Assessment: Generalized weakness    Lower Extremity Assessment Lower Extremity Assessment: Generalized weakness (LLE wound wrapped in gauze)       Communication      Cognition Arousal/Alertness: Awake/alert Behavior During Therapy: WFL for tasks assessed/performed Overall Cognitive Status: Difficult to assess                                 General Comments: Pt oriented to year, follows simple commands with extra time, requires encouragement for participation.        General Comments General comments (skin integrity, edema, etc.): Pt on 4L/min via nasal cannula, SpO2 88-89% once in recliner, BP in LUE 96/61 mmHg MAP 72 -- nurse notified of vitals.    Exercises     Assessment/Plan    PT Assessment Patient needs continued PT services  PT Problem List Decreased strength;Cardiopulmonary status limiting activity;Decreased range of motion;Decreased activity tolerance;Decreased balance;Decreased knowledge of precautions;Decreased mobility;Decreased safety awareness;Decreased knowledge of use of DME;Decreased skin integrity;Decreased cognition       PT Treatment Interventions Therapeutic exercise;Balance training;DME instruction;Gait  training;Stair training;Neuromuscular re-education;Functional mobility training;Cognitive remediation;Therapeutic activities;Patient/family education;Modalities;Manual techniques    PT Goals (Current goals can be found in the Care Plan section)  Acute Rehab PT Goals Patient Stated Goal: none stated PT Goal Formulation: With patient Time For Goal Achievement: 03/24/23 Potential to Achieve Goals: Fair    Frequency Min 2X/week     Co-evaluation               AM-PAC PT "6 Clicks" Mobility  Outcome Measure Help needed turning from your back to your side while in a flat bed without using bedrails?: A Little Help needed moving from lying on your back to sitting on the side of a flat bed without using bedrails?: A Little Help needed moving to and from a bed to a chair (including a wheelchair)?: A Little Help needed standing up from a chair using your arms (e.g., wheelchair or bedside chair)?: A Little Help needed to walk in hospital room?: A Lot Help needed climbing 3-5 steps with a railing? : Total 6 Click Score: 15    End of Session Equipment Utilized During Treatment: Oxygen Activity Tolerance: Patient tolerated treatment well Patient left: in chair;with call bell/phone within reach Nurse Communication: Mobility status (vitals) PT Visit Diagnosis: Muscle weakness (generalized) (M62.81);Other abnormalities of gait and mobility (R26.89);Difficulty in walking, not elsewhere  classified (R26.2);Unsteadiness on feet (R26.81)    Time: [id number] PT Time Calculation (min) (ACUTE ONLY): 27 min   Charges:   PT Evaluation $PT Eval Moderate Complexity: Thomas, PT, DPT 03/10/23, 9:49 AM   Waunita Schooner 03/10/2023, 9:48 AM

## 2023-03-10 NOTE — Evaluation (Signed)
Occupational Therapy Evaluation Patient Details Name: John [name] MRN: [id number] DOB: 1946-04-27 Today's Date: 03/10/2023   History of Present Illness Pt is a 77 y/o M admitted on 03/05/23 after presenting with c/c of SOB. Pt was found to have O2 desaturation to 70-80s on home 3L O2. Pt also with painful wound on posterior LLE. Pt is being treated for acute on chronic diastolic CHF. Pt with recent admission 2/14-3/19 due to hypovolemic & cardiogenic shock in the setting of duodenal ulcer bleeding. PMH: HTN, HLD, COPD 3L O2, diastolic CHF, CKD stage IV, duodenal ulcer with bleeding   Clinical Impression   Patient seen for OT evaluation, agreeable to participate with encouragement. Pt presenting with decreased independence in self care, balance, functional mobility/transfers, endurance, and safety awareness. PTA pt lived with significant other, was independent for ADLs, and independent for functional mobility without an AD. Pt received in recliner and required Min-Mod A for simulated toilet transfer using RW. Activity tolerance limited this date 2/2 B foot pain and generalized weakness. Pt will benefit from acute OT to increase overall independence in the areas of ADLs and functional mobility in order to safely discharge to next venue of care. OT recommends ongoing therapy upon discharge to maximize safety and independence with ADLs, decrease fall risk, decrease caregiver burden, and promote return to PLOF.     Recommendations for follow up therapy are one component of a multi-disciplinary discharge planning process, led by the attending physician.  Recommendations may be updated based on patient status, additional functional criteria and insurance authorization.   Assistance Recommended at Discharge Frequent or constant Supervision/Assistance  Patient can return home with the following A lot of help with bathing/dressing/bathroom;A lot of help with walking and/or transfers;Assistance with  cooking/housework;Assist for transportation;Help with stairs or ramp for entrance;Direct supervision/assist for financial management;Direct supervision/assist for medications management    Functional Status Assessment  Patient has had a recent decline in their functional status and demonstrates the ability to make significant improvements in function in a reasonable and predictable amount of time.  Equipment Recommendations  Other (comment) (defer to next venue of care)    Recommendations for Other Services       Precautions / Restrictions Precautions Precautions: Fall Restrictions Weight Bearing Restrictions: No      Mobility Bed Mobility               General bed mobility comments: NT, pt received/left in recliner    Transfers Overall transfer level: Needs assistance Equipment used: Rolling walker (2 wheels) Transfers: Sit to/from Stand Sit to Stand: Min assist, Mod assist           General transfer comment: increased time required to initiate      Balance Overall balance assessment: Needs assistance Sitting-balance support: Feet supported, Bilateral upper extremity supported Sitting balance-Leahy Scale: Good     Standing balance support: Bilateral upper extremity supported, During functional activity, Reliant on assistive device for balance Standing balance-Leahy Scale: Poor                             ADL either performed or assessed with clinical judgement   ADL Overall ADL's : Needs assistance/impaired     Grooming: Set up;Supervision/safety;Sitting           Upper Body Dressing : Minimal assistance;Sitting       Toilet Transfer: Rolling walker (2 wheels);Minimal assistance;Moderate assistance Toilet Transfer Details (indicate cue type and reason): simulated Toileting- Clothing  Manipulation and Hygiene: Maximal assistance;Sit to/from stand Toileting - Clothing Manipulation Details (indicate cue type and reason): anticipate 2/2  requiring BUE support from RW to maintain static standing balance             Vision Patient Visual Report: No change from baseline       Perception     Praxis      Pertinent Vitals/Pain Pain Assessment Pain Assessment: Faces Faces Pain Scale: Hurts even more Pain Location: BLE/feet Pain Descriptors / Indicators: Guarding, Discomfort Pain Intervention(s): Limited activity within patient's tolerance, Monitored during session     Hand Dominance Right   Extremity/Trunk Assessment Upper Extremity Assessment Upper Extremity Assessment: Generalized weakness   Lower Extremity Assessment Lower Extremity Assessment: Generalized weakness       Communication Communication Communication: Other (comment) (speaks softly)   Cognition Arousal/Alertness: Awake/alert Behavior During Therapy: WFL for tasks assessed/performed Overall Cognitive Status: No family/caregiver present to determine baseline cognitive functioning         General Comments: Pt oriented to year, follows simple commands with extra time, requires encouragement for participation.     General Comments       Exercises Other Exercises Other Exercises: OT provided education re: role of OT, OT POC, post acute recs, sitting up for all meals, EOB/OOB mobility with assistance, home/fall safety.     Shoulder Instructions      Home Living Family/patient expects to be discharged to:: Skilled nursing facility Living Arrangements: Spouse/significant other Available Help at Discharge: Family;Available 24 hours/day Type of Home: House Home Access: Stairs to enter [employer] of Steps: 3   Home Layout: One level     Bathroom Shower/Tub: Teacher, early years/pre: Standard     Home Equipment: Cane - quad;Standard Environmental consultant   Additional Comments: Pt lives with John [name], long time GF who is retired and available 24/7 per patient      Prior Functioning/Environment Prior Level of Function :  Independent/Modified Independent;History of Falls (last six months)             Mobility Comments: Ind amb without an AD limited community distances, one fall in the last 6 months ADLs Comments: Pt reports Ind with self care and shared IADL tasks        OT Problem List: Decreased strength;Decreased activity tolerance;Impaired balance (sitting and/or standing);Decreased knowledge of use of DME or AE;Decreased safety awareness;Decreased cognition;Decreased coordination;Decreased range of motion;Cardiopulmonary status limiting activity;Pain      OT Treatment/Interventions: Self-care/ADL training;Therapeutic exercise;Therapeutic activities;Energy conservation;DME and/or AE instruction;Patient/family education;Balance training    OT Goals(Current goals can be found in the care plan section) Acute Rehab OT Goals Patient Stated Goal: none stated OT Goal Formulation: With patient Time For Goal Achievement: 03/24/23 Potential to Achieve Goals: Fair   OT Frequency: Min 2X/week    Co-evaluation              AM-PAC OT "6 Clicks" Daily Activity     Outcome Measure Help from another person eating meals?: A Little Help from another person taking care of personal grooming?: A Little Help from another person toileting, which includes using toliet, bedpan, or urinal?: A Lot Help from another person bathing (including washing, rinsing, drying)?: A Lot Help from another person to put on and taking off regular upper body clothing?: A Little Help from another person to put on and taking off regular lower body clothing?: A Lot 6 Click Score: 15   End of Session Equipment Utilized During Treatment: Gait belt;Rolling  walker (2 wheels) Nurse Communication: Mobility status  Activity Tolerance: Patient limited by pain;Patient limited by fatigue Patient left: in chair;with call bell/phone within reach;with chair alarm set;Other (comment) (MD present in room)  OT Visit Diagnosis: Unsteadiness on  feet (R26.81);Muscle weakness (generalized) (M62.81);Pain Pain - Right/Left:  (bilateral) Pain - part of body: Ankle and joints of foot;Leg                Time: [id number] OT Time Calculation (min): 13 min Charges:  OT General Charges $OT Visit: 1 Visit OT Evaluation $OT Eval Moderate Complexity: 1 Mod  [facility] MS, OTR/L ascom [phone]  03/10/23, 5:29 PM

## 2023-03-10 NOTE — TOC Progression Note (Signed)
Transition of Care [facility]) - Progression Note    Patient Details  Name: John [name] MRN: [id number] Date of Birth: 08-May-1946  [facility] of [facility]) CM/SW Contact  Laurena Slimmer, RN Phone Number: 03/10/2023, 3:41 PM  Clinical Narrative:    Damaris Schooner with Coralyn Mark from [facility] at [facility] to answer questions regarding clinical sent. She inquired about patient being hypotensive per nephrology note 3/25 @11 :14 pm,  and urine output She was updated from this RNCM per attending MD patient is asymptomatic with blood pressures and MAP is 72.  Clinical determination still in review.     Expected Discharge Plan: [place] Barriers to Discharge: Continued Medical Work up  Expected Discharge Plan and Services       Living arrangements for the past 2 months: Single Family Home                                       Social Determinants of Health (SDOH) Interventions SDOH Screenings   Food Insecurity: No Food Insecurity (01/29/2023)  Housing: Low Risk  (01/29/2023)  Transportation Needs: No Transportation Needs (01/29/2023)  Utilities: Not At Risk (01/29/2023)  Alcohol Screen: Low Risk  (07/17/2022)  Depression (PHQ2-9): Low Risk  (07/30/2022)  Financial Resource Strain: Low Risk  (07/17/2022)  Physical Activity: Sufficiently Active (07/17/2022)  Social Connections: Socially Integrated (07/17/2022)  Stress: No Stress Concern Present (07/17/2022)  Tobacco Use: Medium Risk (03/06/2023)    Readmission Risk Interventions    01/30/2023   11:09 AM  Readmission Risk Prevention Plan  Transportation Screening Complete  Medication Review ([place]) Complete  PCP or Specialist appointment within 3-5 days of discharge Complete  HRI or [place] Complete  SW Recovery Care/Counseling Consult Complete  [place] Not Applicable

## 2023-03-11 LAB — BASIC METABOLIC PANEL
Anion gap: 5 (ref 5–15)
BUN: 47 mg/dL — ABNORMAL HIGH (ref 8–23)
CO2: 33 mmol/L — ABNORMAL HIGH (ref 22–32)
Calcium: 8.7 mg/dL — ABNORMAL LOW (ref 8.9–10.3)
Chloride: 98 mmol/L (ref 98–111)
Creatinine, Ser: 2.39 mg/dL — ABNORMAL HIGH (ref 0.61–1.24)
GFR, Estimated: 27 mL/min — ABNORMAL LOW (ref >60)
Glucose, Bld: 118 mg/dL — ABNORMAL HIGH (ref 70–99)
Potassium: 4.4 mmol/L (ref 3.5–5.1)
Sodium: 136 mmol/L (ref 135–145)

## 2023-03-11 MED ORDER — MIDODRINE HCL 5 MG PO TABS
5.0000 mg | ORAL_TABLET | Freq: Three times a day (TID) | ORAL | Status: DC
  Administered 2023-03-11 – 2023-03-12 (×2): 5 mg via ORAL
  Filled 2023-03-11 (×2): qty 1

## 2023-03-11 MED ORDER — FUROSEMIDE 40 MG PO TABS
40.0000 mg | ORAL_TABLET | Freq: Every day | ORAL | Status: DC
  Filled 2023-03-11: qty 1

## 2023-03-11 MED ORDER — HEPARIN SODIUM (PORCINE) 5000 UNIT/ML IJ SOLN
5000.0000 [IU] | Freq: Two times a day (BID) | INTRAMUSCULAR | Status: DC
  Administered 2023-03-11 – 2023-03-16 (×10): 5000 [IU] via SUBCUTANEOUS
  Filled 2023-03-11 (×10): qty 1

## 2023-03-11 NOTE — Plan of Care (Signed)

## 2023-03-11 NOTE — TOC Progression Note (Addendum)
Transition of Care [facility]) - Progression Note    Patient Details  Name: John [name] MRN: [id number] Date of Birth: [birth year]  Transition of Care [facility]) CM/SW [place], LCSW Phone Number: 03/11/2023, 3:49 PM  Clinical Narrative:  Met with patient and provided SNF bed offers. Patient stated he wanted to review with his wife and that she is supposed to be here later today. CSW also left her a voicemail. RN will notify her if she arrives.   4:46 pm: Patient's significant other called back. Provided update. She asked about [facility]. They initially declined because patient was in the ED. CSW sent referral again and asked admissions coordinator to review.  Expected Discharge Plan: [place] Barriers to Discharge: Continued Medical Work up  Expected Discharge Plan and Services       Living arrangements for the past 2 months: Single Family Home                                       Social Determinants of Health (SDOH) Interventions SDOH Screenings   Food Insecurity: No Food Insecurity (01/29/2023)  Housing: Low Risk  (01/29/2023)  Transportation Needs: No Transportation Needs (01/29/2023)  Utilities: Not At Risk (01/29/2023)  Alcohol Screen: Low Risk  (07/17/2022)  Depression (PHQ2-9): Low Risk  (07/30/2022)  Financial Resource Strain: Low Risk  (07/17/2022)  Physical Activity: Sufficiently Active (07/17/2022)  Social Connections: Socially Integrated (07/17/2022)  Stress: No Stress Concern Present (07/17/2022)  Tobacco Use: Medium Risk (03/06/2023)    Readmission Risk Interventions    01/30/2023   11:09 AM  Readmission Risk Prevention Plan  Transportation Screening Complete  Medication Review ([place]) Complete  PCP or Specialist appointment within 3-5 days of discharge Complete  HRI or [place] Complete  SW Recovery Care/Counseling Consult Complete  [place] Not  Applicable

## 2023-03-12 DIAGNOSIS — I5033 Acute on chronic diastolic (congestive) heart failure: Secondary | ICD-10-CM | POA: Diagnosis not present

## 2023-03-12 LAB — CBC
HCT: 31.6 % — ABNORMAL LOW (ref 39.0–52.0)
Hemoglobin: 9.3 g/dL — ABNORMAL LOW (ref 13.0–17.0)
MCH: 30.7 pg (ref 26.0–34.0)
MCHC: 29.4 g/dL — ABNORMAL LOW (ref 30.0–36.0)
MCV: 104.3 fL — ABNORMAL HIGH (ref 80.0–100.0)
Platelets: 166 10*3/uL (ref 150–400)
RBC: 3.03 MIL/uL — ABNORMAL LOW (ref 4.22–5.81)
RDW: 20.2 % — ABNORMAL HIGH (ref 11.5–15.5)
WBC: 9.4 10*3/uL (ref 4.0–10.5)
nRBC: 0.4 % — ABNORMAL HIGH (ref 0.0–0.2)

## 2023-03-12 LAB — BRAIN NATRIURETIC PEPTIDE: B Natriuretic Peptide: 3834.7 pg/mL — ABNORMAL HIGH (ref 0.0–100.0)

## 2023-03-12 LAB — PHOSPHORUS: Phosphorus: 3.4 mg/dL (ref 2.5–4.6)

## 2023-03-12 MED ORDER — TRAZODONE HCL 50 MG PO TABS: 50.0000 mg | ORAL_TABLET | Freq: Every evening | ORAL | Status: DC | PRN

## 2023-03-12 MED ORDER — HYDRALAZINE HCL 20 MG/ML IJ SOLN: 10.0000 mg | INTRAMUSCULAR | Status: DC | PRN

## 2023-03-12 MED ORDER — METOPROLOL TARTRATE 5 MG/5ML IV SOLN: 5.0000 mg | INTRAVENOUS | Status: DC | PRN

## 2023-03-12 MED ORDER — FUROSEMIDE 10 MG/ML IJ SOLN
20.0000 mg | Freq: Two times a day (BID) | INTRAMUSCULAR | Status: DC
  Administered 2023-03-12 – 2023-03-13 (×2): 20 mg via INTRAVENOUS
  Filled 2023-03-12 (×2): qty 2

## 2023-03-12 MED ORDER — IPRATROPIUM-ALBUTEROL 0.5-2.5 (3) MG/3ML IN SOLN: 3.0000 mL | RESPIRATORY_TRACT | Status: DC | PRN

## 2023-03-12 MED ORDER — SENNOSIDES-DOCUSATE SODIUM 8.6-50 MG PO TABS: 1.0000 | ORAL_TABLET | Freq: Every evening | ORAL | Status: DC | PRN

## 2023-03-12 MED ORDER — MIDODRINE HCL 5 MG PO TABS
10.0000 mg | ORAL_TABLET | Freq: Three times a day (TID) | ORAL | Status: DC
  Administered 2023-03-12 – 2023-03-16 (×11): 10 mg via ORAL
  Filled 2023-03-12 (×13): qty 2

## 2023-03-12 NOTE — TOC Progression Note (Addendum)
Transition of Care [facility]) - Progression Note    Patient Details  Name: Mcihael Bach MRN: [id number] Date of Birth: Dec 11, 1946  Transition of Care [facility]) CM/SW [place], LCSW Phone Number: 03/12/2023, 11:00 AM  Clinical Narrative:  [facility] is reviewing referral.   1:14 pm: Patient and significant other have accepted bed offer from [facility]  3:25 pm: Per MD, likely stable for discharge in 2 days. SNF is aware.  Expected Discharge Plan: [place] Barriers to Discharge: Continued Medical Work up  Expected Discharge Plan and Services       Living arrangements for the past 2 months: Single Family Home                                       Social Determinants of Health (SDOH) Interventions SDOH Screenings   Food Insecurity: No Food Insecurity (01/29/2023)  Housing: Low Risk  (01/29/2023)  Transportation Needs: No Transportation Needs (01/29/2023)  Utilities: Not At Risk (01/29/2023)  Alcohol Screen: Low Risk  (07/17/2022)  Depression (PHQ2-9): Low Risk  (07/30/2022)  Financial Resource Strain: Low Risk  (07/17/2022)  Physical Activity: Sufficiently Active (07/17/2022)  Social Connections: Socially Integrated (07/17/2022)  Stress: No Stress Concern Present (07/17/2022)  Tobacco Use: Medium Risk (03/06/2023)    Readmission Risk Interventions    01/30/2023   11:09 AM  Readmission Risk Prevention Plan  Transportation Screening Complete  Medication Review ([place]) Complete  PCP or Specialist appointment within 3-5 days of discharge Complete  HRI or [place] Complete  SW Recovery Care/Counseling Consult Complete  [place] Not Applicable

## 2023-03-12 NOTE — Consult Note (Signed)
[place] NOTE       Patient ID: John [name] MRN: [id number] DOB/AGE: 78-29-1947 78 y.o.  Admit date: 03/05/2023 Referring Physician Dr. Gerlean Ren Primary Physician Dr. Ginette Pitman Primary Cardiologist Dr. Nehemiah Massed Reason for Consultation AoCHF  HPI: John [name] is a 32yoM with a PMH of HFpEF (LVEF 50-55%, RV failure with severely elevated RVSP (62.42mmHg), mod RA dilation, mi-mod TR and MR 01/2023), COPD (2L baseline), CKD 3, chronic thrombocytopenia, HTN, hx tobacco use with recent prolonged hospital admission for volume overload and respiratory failure requiring mechanical ventilation in the setting of renal failure requiring CRRT and acute on chronic HFpEF c/b UGIB from a duodenal ulcer. He was discharged to a SNF on 3/19 off of diuretics. Presented to [facility] 3/20 with shortness of breath, cardiology is consulted on 3/27 for assistance with his CHF.   The patient is well known to our service from multiple prior admissions. He is not a very forthcoming historian so history is obtained from the chart. Was recently admitted from 2/14 - 3/19 with acute renal failure & cardiogenic shock requiring CRRT, and later hypovolemic shock from ABLA 2/2 upper GIB from a duodenal ulcer. He eventually had renal recovery with Cr 2.13 and GFR 31 was discharged to a SNF off of all diuretics.  He presented back to [facility] on 3/20 with reported shortness of breath and hypoxia to 79% on 3L by [place].  So far during admission he was given grams IV Lasix twice daily for his first 2 days of his hospitalization, then once daily on 3/23, and 3/24.  Further diuresis was held due to worsening renal function and now to low blood pressures.  At my time of evaluation he is sitting upright in his recliner, watching TV with a mostly full breakfast tray in front of him.  He denies any chest pain, shortness of breath, heart racing or palpitations.  He says his peripheral edema is at baseline and is not sure why he is  in the hospital.   Recent vitals are notable for a blood pressure of 87/64, heart rate in the high 90s to low 100s in sinus rhythm to sinus tachycardia on telemetry.  He is saturating well on 3 L of oxygen by nasal cannula.  Weights have increased from admission from 185 pounds up to 190 pounds today.  Recent labs notable for BUN/creatinine 47/2.93 and GFR of 27.  BNP uptrending from 2659 checked on 3/20 compared to 3834 today.  Iron levels low 28, TIBC and folate within normal limits.  Has chronic anemia with H&H 8.2/26 and platelets 145.  Review of systems complete and found to be negative unless listed above     Past Medical History:  Diagnosis Date   CHF (congestive heart failure) (HCC)    EF 45% in 2021   Chronic kidney disease    COPD (chronic obstructive pulmonary disease) ([place]) 02/16/2020   Hyperlipidemia    Hypertension    Pneumonia 2021    Past Surgical History:  Procedure Laterality Date   CATARACT EXTRACTION W/PHACO Left 10/31/2021   Procedure: CATARACT EXTRACTION PHACO AND INTRAOCULAR LENS PLACEMENT ([place]) LEFT VISION BLUE 3.45 00:48.0;  Surgeon: Leandrew Koyanagi, MD;  Location: [place];  Service: Ophthalmology;  Laterality: Left;   CATARACT EXTRACTION W/PHACO Right 11/14/2021   Procedure: CATARACT EXTRACTION PHACO AND INTRAOCULAR LENS PLACEMENT ([facility]) RIGHT 8.59 01:10.5;  Surgeon: Leandrew Koyanagi, MD;  Location: [place];  Service: Ophthalmology;  Laterality: Right;   DIALYSIS/PERMA CATHETER INSERTION Right  02/10/2023   Procedure: DIALYSIS/PERMA CATHETER INSERTION;  Surgeon: Algernon Huxley, MD;  Location: [place] [facility];  Service: Cardiovascular;  Laterality: Right;   DIALYSIS/PERMA CATHETER REMOVAL Right 02/17/2023   Procedure: DIALYSIS/PERMA CATHETER REMOVAL;  Surgeon: Algernon Huxley, MD;  Location: [place] [facility];  Service: Cardiovascular;  Laterality: Right;   ESOPHAGOGASTRODUODENOSCOPY (EGD) WITH PROPOFOL N/A 02/19/2023   Procedure:  ESOPHAGOGASTRODUODENOSCOPY (EGD) WITH PROPOFOL;  Surgeon: Toledo, Benay Pike, MD;  Location: [facility];  Service: Gastroenterology;  Laterality: N/A;   INGUINAL HERNIA REPAIR Right 02/04/2017   Procedure: HERNIA REPAIR INGUINAL ADULT;  Surgeon: Leonie Green, MD;  Location: [facility];  Service: General;  Laterality: Right;   NO PAST SURGERIES     QUADRICEPS TENDON REPAIR Left 02/15/2020   Procedure: REPAIR QUADRICEP TENDON;  Surgeon: Corky Mull, MD;  Location: [facility];  Service: Orthopedics;  Laterality: Left;    Medications Prior to Admission  Medication Sig Dispense Refill Last Dose   ascorbic acid (VITAMIN C) 500 MG tablet Take 1 tablet (500 mg total) by mouth 2 (two) times daily. 30 tablet 0 03/05/2023 at 0800   calcitRIOL (ROCALTROL) 0.25 MCG capsule Take 0.25 mcg by mouth daily.   03/05/2023 at 0800   feeding supplement (ENSURE ENLIVE / ENSURE PLUS) LIQD Take 237 mLs by mouth 3 (three) times daily between meals. 237 mL 12 03/05/2023 at 0800   ipratropium-albuterol (DUONEB) 0.5-2.5 (3) MG/3ML SOLN Inhale 3 mLs into the lungs every 6 (six) hours as needed (wheezing).   prn at unk   multivitamin (RENA-VIT) TABS tablet Take 1 tablet by mouth at bedtime. 30 tablet 0 03/05/2023 at 0800   pantoprazole (PROTONIX) 40 MG tablet Take 1 tablet (40 mg total) by mouth 2 (two) times daily. 120 tablet 2 03/05/2023 at 0800   Social History   Socioeconomic History   Marital status: Single    Spouse name: Not on file   Number of children: Not on file   Years of education: Not on file   Highest education level: Not on file  Occupational History   Not on file  Tobacco Use   Smoking status: Former    Packs/day: 0.25    Years: 53.00    Additional pack years: 0.00    Total pack years: 13.25    Types: Cigarettes    Quit date: 02/15/2015    Years since quitting: 8.0   Smokeless tobacco: Never  Vaping Use   Vaping Use: Never used  Substance and Sexual Activity   Alcohol use: Yes    Comment:  occassional   Drug use: No   Sexual activity: Not on file  Other Topics Concern   Not on file  Social History Narrative   Not on file   Social Determinants of Health   Financial Resource Strain: Low Risk  (07/17/2022)   Overall Financial Resource Strain (CARDIA)    Difficulty of Paying Living Expenses: Not hard at all  Food Insecurity: No Food Insecurity (01/29/2023)   Hunger Vital Sign    Worried About Running Out of Food in the Last Year: Never true    Ran Out of Food in the Last Year: Never true  Transportation Needs: No Transportation Needs (01/29/2023)   PRAPARE - Hydrologist (Medical): No    Lack of Transportation (Non-Medical): No  Physical Activity: Sufficiently Active (07/17/2022)   Exercise Vital Sign    Days of Exercise per Week: 5 days    Minutes  of Exercise per Session: 60 min  Stress: No Stress Concern Present (07/17/2022)   [place]    Feeling of Stress : Not at all  Social Connections: [place] (07/17/2022)   Social Connection and Isolation Panel [NHANES]    Frequency of Communication with Friends and Family: More than three times a week    Frequency of Social Gatherings with Friends and Family: More than three times a week    Attends Religious Services: 1 to 4 times per year    Active Member of Clubs or Organizations: Yes    Attends Archivist Meetings: More than 4 times per year    Marital Status: Living with partner  Intimate Partner Violence: Not At Risk (01/29/2023)   Humiliation, Afraid, Rape, and Kick questionnaire    Fear of Current or Ex-Partner: No    Emotionally Abused: No    Physically Abused: No    Sexually Abused: No    Family History  Problem Relation Age of Onset   Diabetes Mother    Diabetes Brother    Diabetes Maternal Grandmother    Diabetes Paternal Grandmother       Intake/Output Summary (Last 24 hours) at 03/12/2023  0857 Last data filed at 03/12/2023 0800 Gross per 24 hour  Intake 240 ml  Output 400 ml  Net -160 ml    Vitals:   03/11/23 2341 03/12/23 0340 03/12/23 0735 03/12/23 0737  BP: (!) 86/63 (!) 84/66  (!) 87/64  Pulse: 96 99  100  Resp: 18 20  20   Temp: 98.1 F (36.7 C) 98 F (36.7 C)  98 F (36.7 C)  TempSrc:  Oral    SpO2: 95% 93%  91%  Weight:   86.2 kg   Height:        PHYSICAL EXAM General: Chronically ill appearing bal , well nourished, in no acute distress. HEENT:  Normocephalic and atraumatic. Neck:  No JVD.  Lungs: Normal respiratory effort on 3 L by nasal cannula.  Decreased breath sounds with bibasilar crackles.   Heart: HRRR . Normal S1 and S2 without gallops or murmurs.  Abdomen: Non-distended appearing.  Msk: Normal strength and tone for age. Extremities: Warm to touch.  Trace bilateral lower extremity edema.  Neuro: Alert and oriented X 3. Psych:  Answers questions appropriately but is a limited historian.   Labs: Basic Metabolic Panel: Recent Labs    03/09/23 0943 03/11/23 0850  NA 134* 136  K 4.1 4.4  CL 95* 98  CO2 30 33*  GLUCOSE 108* 118*  BUN 44* 47*  CREATININE 2.46* 2.39*  CALCIUM 8.9 8.7*   Liver Function Tests: No results for input(s): "AST", "ALT", "ALKPHOS", "BILITOT", "PROT", "ALBUMIN" in the last 72 hours. No results for input(s): "LIPASE", "AMYLASE" in the last 72 hours. CBC: No results for input(s): "WBC", "NEUTROABS", "HGB", "HCT", "MCV", "PLT" in the last 72 hours. Cardiac Enzymes: No results for input(s): "CKTOTAL", "CKMB", "CKMBINDEX", "TROPONINIHS" in the last 72 hours. BNP: No results for input(s): "BNP" in the last 72 hours. D-Dimer: No results for input(s): "DDIMER" in the last 72 hours. Hemoglobin A1C: No results for input(s): "HGBA1C" in the last 72 hours. Fasting Lipid Panel: No results for input(s): "CHOL", "HDL", "LDLCALC", "TRIG", "CHOLHDL", "LDLDIRECT" in the last 72 hours. Thyroid Function Tests: No results for  input(s): "TSH", "T4TOTAL", "T3FREE", "THYROIDAB" in the last 72 hours.  Invalid input(s): "FREET3" Anemia Panel: No results for input(s): "VITAMINB12", "FOLATE", "FERRITIN", "TIBC", "  IRON", "RETICCTPCT" in the last 72 hours.   Radiology: [facility] Chest Port 1 View  Result Date: 03/05/2023 CLINICAL DATA:  Shortness of breath. EXAM: PORTABLE CHEST 1 VIEW COMPARISON:  February 04, 2023. FINDINGS: Stable cardiomediastinal silhouette. Mild right basilar atelectasis or infiltrate is noted with associated pleural effusion. Left lung is unremarkable. Bony thorax is unremarkable. IMPRESSION: Mild right basilar atelectasis or infiltrate is noted with associated pleural effusion. Electronically Signed   By: Marijo Conception M.D.   On: 03/05/2023 13:10   CT ANGIO GI BLEED  Result Date: 02/19/2023 CLINICAL DATA:  Bloody bowel movement. Concern for active gastrointestinal bleeding. EXAM: CTA ABDOMEN AND PELVIS WITHOUT AND WITH CONTRAST TECHNIQUE: Multidetector CT imaging of the abdomen and pelvis was performed using the standard protocol during bolus administration of intravenous contrast. Multiplanar reconstructed images and MIPs were obtained and reviewed to evaluate the vascular anatomy. RADIATION DOSE REDUCTION: This exam was performed according to the departmental dose-optimization program which includes automated exposure control, adjustment of the mA and/or kV according to patient size and/or use of iterative reconstruction technique. CONTRAST:  82mL OMNIPAQUE IOHEXOL 350 MG/ML SOLN COMPARISON:  None Available. FINDINGS: VASCULAR Aorta: Atherosclerotic calcification of the aorta. Celiac: Patent without evidence of aneurysm, dissection, vasculitis or significant stenosis. SMA: Patent without evidence of aneurysm, dissection, vasculitis or significant stenosis. Renals: Both renal arteries are patent without evidence of aneurysm, dissection, vasculitis, fibromuscular dysplasia or significant stenosis. IMA: Patent without  evidence of aneurysm, dissection, vasculitis or significant stenosis. Inflow: Patent without evidence of aneurysm, dissection, vasculitis or significant stenosis. Proximal Outflow: Aortic calcification. Atherosclerotic calcification. No aneurysm. Veins: No obvious venous abnormality within the limitations of this arterial phase study. Review of the MIP images confirms the above findings. NON-VASCULAR Lower chest: Lung bases are clear. Hepatobiliary: No focal hepatic lesion. Normal gallbladder. No biliary duct dilatation. Common bile duct is normal. Pancreas: Pancreas is normal. No ductal dilatation. No pancreatic inflammation. Spleen: Normal spleen Adrenals/urinary tract: Adrenal glands and kidneys are normal. The ureters and bladder normal. Stomach/Bowel: Stomach is normal. On delayed portal venous phase imaging there is a collection of endoluminal contrast within the second portion the duodenum (image 32/11). Findings consistent with active CT chest of bleeding. On arterial phase imaging there is a tiny focus of intraluminal contrast at this site (image 67/5). No evidence of active gastrointestinal bleeding within the small bowel. No small bowel inflammation or dilatation. There is high-density material within the colon. No evidence of active extravasation IV contrast into the lumen of the LEFT or RIGHT colon. Rectosigmoid colon likewise unremarkable except for several diverticula. Rectum unremarkable. Vascular/Lymphatic: Abdominal aorta is normal caliber with atherosclerotic calcification. There is no retroperitoneal or periportal lymphadenopathy. No pelvic lymphadenopathy. Reproductive: Prostate unremarkable Other: Mild sarcoma of the soft tissues. Musculoskeletal: Degenerative osteophytosis of the spine. IMPRESSION: VASCULAR 1. Active gastrointestinal bleeding in the second portion duodenum evident on the portal venous phase imaging. 2. Atherosclerotic calcification of the aorta is branches without high-grade  stenosis. NON-VASCULAR 1. No evidence of bowel obstruction or inflammation. These results will be called to the ordering clinician or representative by the Radiologist Assistant, and communication documented in the PACS or [employer]. Electronically Signed   By: Suzy Bouchard M.D.   On: 02/19/2023 13:59   PERIPHERAL VASCULAR CATHETERIZATION  Result Date: 02/17/2023 See surgical note for result.  [place] UE VEIN MAPPING LEFT (PRE-OP AVF)  Result Date: 02/12/2023 CLINICAL DATA:  End-stage renal disease. Please perform left upper extremity venous mapping prior to attempted  dialysis access creation. EXAM: [place] EXTREM UP VEIN MAPPING COMPARISON:  None Available. FINDINGS: LEFT ARTERIES Wrist Radial Artery: Size 2.6 mm Waveform Triphasic Wrist Ulnar Artery: Size 2.0 mm Waveform Triphasic Prox. Forearm Radial Artery: Size 2.4 mm Waveform Triphasic Upper Arm Brachial Artery: Note is made of a high division of the brachial artery at the level of the proximal humerus (representative image 2). LEFT VEINS Forearm Cephalic Vein: Prox 2.7 mm - Depth 4.7 mm; Distal 2.5 mm - Depth 3.6 mm Upper Arm Cephalic Vein: Prox 2.3 mm - Depth 4.1 mm; Distal 2.4 mm - Depth 3.9 mm Upper Arm Basilic Vein: Prox 8.4 mm - Depth 8.5 mm; Distal 4.4 mm - Depth 9.0 mm Upper Arm Radial vein (secondary to proximal division of the brachial artery): Prox 4.1 mm - Depth 9.5 mm; Distal 4.5 mm - Depth 9.0 mm Upper Arm Ulnar vein (secondary to proximal division of the brachial artery): Prox 5.1 mm - Depth 10.6 mm; Distal 3.8 mm - Depth 14.3 mm ADDITIONAL LEFT VEINS Axillary Vein: 18.2 mm Subclavian Vein: Patient: Yes Respiratory Phasicity: Present Internal Jugular Vein: Patent: Yes    Respiratory Phasicity: Present Branches > 2 mm: None though the cephalic vein appears to have 3 separate divisions at the level of the mid/distal forearm (image 49), while the basilic vein has 3 separate divisions at the level of the distal humerus (image 60).  IMPRESSION: Left upper extremity venous mapping as detailed above, significant for high division of the brachial artery at the level of the proximal humerus. Electronically Signed   By: Sandi Mariscal M.D.   On: 02/12/2023 09:32   PERIPHERAL VASCULAR CATHETERIZATION  Result Date: 02/10/2023 See surgical note for result.   ECHO 01/30/2023  1. Left ventricular ejection fraction, by estimation, is 50 to 55%. The  left ventricle has low normal function. The left ventricle has no regional  wall motion abnormalities. There is mild left ventricular hypertrophy.  Left ventricular diastolic  parameters are consistent with Grade I diastolic dysfunction (impaired  relaxation). There is the interventricular septum is flattened in systole  and diastole, consistent with right ventricular pressure and volume  overload.   2. Right ventricular systolic function is severely reduced. The right  ventricular size is moderately enlarged. There is severely elevated  pulmonary artery systolic pressure. The estimated right ventricular  systolic pressure is [id number] mmHg.   3. Right atrial size was moderately dilated.   4. The mitral valve is normal in structure. Mild mitral valve  regurgitation. No evidence of mitral stenosis. There is mild late systolic  prolapse of both leaflets of the mitral valve.   5. Tricuspid valve regurgitation is mild to moderate.   6. The aortic valve is tricuspid. Aortic valve regurgitation is mild to  moderate. No aortic stenosis is present.   7. The inferior vena cava is dilated in size with <50% respiratory  variability, suggesting right atrial pressure of 15 mmHg.   8. There is mild dilatation of the aortic root, measuring 44 mm. There is  mild dilatation of the ascending aorta, measuring 43 mm.   TELEMETRY reviewed by me (LT) 03/12/2023 : Sinus rhythm to sinus tachycardia rate 98-108.  EKG reviewed by me: Sinus rhythm with nonspecific T wave abnormality rate 96 bpm  Data reviewed by  me (LT) 03/12/2023: Hospitalist and nephrology progress notes, discharge summary from February/March hospitalization this year, last 24h vitals tele labs imaging I/O   Principal Problem:   Acute on chronic diastolic CHF (congestive heart  failure) ([place]) Active Problems:   COPD (chronic obstructive pulmonary disease) (HCC)   Hypertension   CKD (chronic kidney disease), stage IV (HCC)   Macrocytic anemia   Duodenal ulcer   Open wound_in posterior left lower leg    ASSESSMENT AND PLAN:  John [name] is a 30yoM with a PMH of HFpEF (LVEF 50-55%, RV failure with severely elevated RVSP (62.22mmHg), mod RA dilation, mi-mod TR and MR 01/2023), COPD (2L baseline), CKD 3, chronic thrombocytopenia, HTN, hx tobacco use with recent prolonged hospital admission for volume overload and respiratory failure requiring mechanical ventilation in the setting of renal failure requiring CRRT and acute on chronic HFpEF c/b UGIB from a duodenal ulcer. He was discharged to a SNF on 3/19 off of diuretics. Presented to [facility] 3/20 with shortness of breath, cardiology is consulted on 3/27 for assistance with his CHF.   # Acute on chronic HFpEF Known history of RV systolic dysfunction, BNP uptrending from 2500 to 3800 after diuretics have been held the past 3 days due to worsening creatinine/low BPs.  Clinically hypervolemic with lower extremity edema and crackles to auscultation.  Poor baseline functional status and minimal mobility recently only transferring from recliner to bed per patient. -Recommend restarting diuretics with IV Lasix 20 mg twice daily -Agree with increasing midodrine from 5 mg to 10 mg 3 times daily. -Other GDMT limited by low BP and tenuous renal function -No additional cardiac diagnostics planned at this time.  # CKD III Baseline renal function with BUN/creatinine 32/2.13 and GFR 31 on 3/20.  Presently BUN/creatinine 47/2.39 and GFR of 27.  Continue to monitor closely.  This patient's plan of care was  discussed and created with Dr. Clayborn Bigness and he is in agreement.  Signed: Tristan Schroeder , PA-C 03/12/2023, 8:57 AM [facility] Cardiology

## 2023-03-12 NOTE — Progress Notes (Signed)
Occupational Therapy Treatment Patient Details Name: John [name] MRN: [id number] DOB: 08-04-1946 Today's Date: 03/12/2023   History of present illness Pt is a 77 y/o M admitted on 03/05/23 after presenting with c/c of SOB. Pt was found to have O2 desaturation to 70-80s on home 3L O2. Pt also with painful wound on posterior LLE. Pt is being treated for acute on chronic diastolic CHF. Pt with recent admission 2/14-3/19 due to hypovolemic & cardiogenic shock in the setting of duodenal ulcer bleeding. PMH: HTN, HLD, COPD 3L O2, diastolic CHF, CKD stage IV, duodenal ulcer with bleeding   OT comments  RN cleared pt for participation in therapy. Pt received sitting up in recliner and agreeable to OT. Pt engaged in seated grooming tasks and UB dressing this date. Pt then completed STS from recliner with Mod A. Once in standing, he fatigued quickly and requested to return to recliner. Pt continues to be functionally limited by generalized weakness and decreased endurance. Pt left as received with all needs in reach. Pt is making progress toward goal completion. D/C recommendation remains appropriate. OT will continue to follow acutely.    Recommendations for follow up therapy are one component of a multi-disciplinary discharge planning process, led by the attending physician.  Recommendations may be updated based on patient status, additional functional criteria and insurance authorization.    Assistance Recommended at Discharge Frequent or constant Supervision/Assistance  Patient can return home with the following  A lot of help with bathing/dressing/bathroom;A lot of help with walking and/or transfers;Assistance with cooking/housework;Assist for transportation;Help with stairs or ramp for entrance;Direct supervision/assist for financial management;Direct supervision/assist for medications management   Equipment Recommendations  Other (comment) (defer to next venue of care)    Recommendations for Other  Services      Precautions / Restrictions Precautions Precautions: Fall Restrictions Weight Bearing Restrictions: No       Mobility Bed Mobility               General bed mobility comments: NT, pt received/left in recliner    Transfers Overall transfer level: Needs assistance Equipment used: Rolling walker (2 wheels) Transfers: Sit to/from Stand Sit to Stand: Mod assist           General transfer comment: increased time required to initiate, VC for hand placement     Balance Overall balance assessment: Needs assistance Sitting-balance support: Feet supported Sitting balance-Leahy Scale: Good     Standing balance support: Bilateral upper extremity supported, During functional activity, Reliant on assistive device for balance Standing balance-Leahy Scale: Poor Standing balance comment: limited standing tolerance 2/2 weakness         ADL either performed or assessed with clinical judgement   ADL Overall ADL's : Needs assistance/impaired     Grooming: Set up;Supervision/safety;Sitting;Wash/dry face           Upper Body Dressing : Minimal assistance;Sitting Upper Body Dressing Details (indicate cue type and reason): to don/doff gown     Toilet Transfer: Moderate assistance;Rolling walker (2 wheels) Toilet Transfer Details (indicate cue type and reason): simulated                Extremity/Trunk Assessment Upper Extremity Assessment Upper Extremity Assessment: Generalized weakness   Lower Extremity Assessment Lower Extremity Assessment: Generalized weakness        Vision Patient Visual Report: No change from baseline     Perception     Praxis      Cognition Arousal/Alertness: Awake/alert Behavior During Therapy: WFL for tasks assessed/performed,  Flat affect Overall Cognitive Status: No family/caregiver present to determine baseline cognitive functioning         General Comments: follows commands with extra time, delayed  processing, requires encouragement to participate        Exercises      Shoulder Instructions       General Comments RN cleared pt for participation, no signs/symptoms    Pertinent Vitals/ Pain       Pain Assessment Pain Assessment: Faces Faces Pain Scale: Hurts a little bit Pain Location: BLE/feet Pain Descriptors / Indicators: Guarding, Discomfort Pain Intervention(s): Limited activity within patient's tolerance, Monitored during session, Repositioned  Home Living            Prior Functioning/Environment              Frequency  Min 2X/week        Progress Toward Goals  OT Goals(current goals can now be found in the care plan section)  Progress towards OT goals: Progressing toward goals  Acute Rehab OT Goals Patient Stated Goal: none stated OT Goal Formulation: With patient Time For Goal Achievement: 03/24/23 Potential to Achieve Goals: [place] Discharge plan remains appropriate;Frequency remains appropriate    Co-evaluation                 AM-PAC OT "6 Clicks" Daily Activity     Outcome Measure   Help from another person eating meals?: A Little Help from another person taking care of personal grooming?: A Little Help from another person toileting, which includes using toliet, bedpan, or urinal?: A Lot Help from another person bathing (including washing, rinsing, drying)?: A Lot Help from another person to put on and taking off regular upper body clothing?: A Little Help from another person to put on and taking off regular lower body clothing?: A Lot 6 Click Score: 15    End of Session Equipment Utilized During Treatment: Gait belt;Rolling walker (2 wheels)  OT Visit Diagnosis: Unsteadiness on feet (R26.81);Muscle weakness (generalized) (M62.81);Pain   Activity Tolerance Patient tolerated treatment well;Patient limited by fatigue   Patient Left in chair;with call bell/phone within reach   Nurse Communication Mobility status         Time: [id number] OT Time Calculation (min): 18 min  Charges: OT General Charges $OT Visit: 1 Visit OT Treatments $Self Care/Home Management : 8-22 mins  [facility] MS, OTR/L ascom [phone]  03/12/23, 5:33 PM

## 2023-03-12 NOTE — Progress Notes (Signed)
PROGRESS NOTE    John [name]  [id number] DOB: [birth year] DOA: 03/05/2023 PCP: Alisa Graff, FNP   Brief Narrative:   77 y.o. male with medical history significant of hypertension, hyperlipidemia, COPD on 3 L oxygen, diastolic CHF CKD stage IV, duodenal ulcer with bleeding, recent admission due to hypovolemic and cardiogenic shock, who presents with shortness of breath.  Patient was recently hospitalized from 2/14 - 3/19 for hypovolemic/cardiogenic shock in the setting of duodenal bleeding ulcer.  Upon discharge his losartan, metolazone and torsemide were held.  Now coming in with CHF exacerbation.  During this admission he has been cautiously diuresed but due to soft blood pressure this is off-and-on been held.  He was also started on midodrine.  Nephrology team has been following the patient due to CKD stage IV but no indication for hemodialysis yet.   Assessment & Plan:  Principal Problem:   Acute on chronic diastolic CHF (congestive heart failure) (HCC) Active Problems:   CKD (chronic kidney disease), stage IV (HCC)   Hypertension   COPD (chronic obstructive pulmonary disease) (HCC)   Macrocytic anemia   Duodenal ulcer   Open wound_in posterior left lower leg    Acute on chronic diastolic CHF (congestive heart failure) ([place]); Class III : 2D echo on 01/30/2023 showed EF of 50-55% with grade 1 diastolic dysfunction.  Due to recent hypovolemic shock, patient's diuretics were on hold.  He developed bilateral leg edema, shortness breath, positive JVD, elevated BNP 2659, clinically consistent with CHF exacerbation. -Initially good response to Lasix but due to soft blood pressure this was held.  Increase Midodrine 10mg  po tid Will consult cardiology as well.    CKD (chronic kidney disease), stage IV (HCC) Baseline creatinine around 2.1.  Today this is 2.39.  Nephrology team is following.  Lasix is on hold   Hypertension: Blood pressure 104/82 -IV hydralazine as needed but due to  hypotension he is on midodrine.   COPD (chronic obstructive pulmonary disease) (HCC) -Bronchodilators   Macrocytic anemia:  Hemoglobin 8.2 (8.9 03/04/2023 at the discharge)   Duodenal ulcer -continue protonix 40 mg bid   Open wound_in posterior left lower leg: Seen by wound care team.  No obvious evidence of infection.  Wound care recommendations:Topical treatment orders provided for bedside nurses to perform as follows to absorb drainage and provide antimicrobial benefits: Apply Aquacel Kellie Simmering # [id number]) to left leg wound Q day, then cover with ABD pad and kerlex.    Pallaitive care input noted--pt is wanting to be FULL code. Overall poor prognosis  Per TOC pt has bed offer at [facility] communication : Consults :nephrology CODE STATUS: Full DVT Prophylaxis :SCD Level of care: Progressive Status is: Inpatient Remains inpatient appropriate because: CHF   Overall long term poor prognosis. Pt seen by Palliative care. Will have them f/u as out pt Pt has a very poor insight    Subjective: Seen and examined at bedside, reports that shortness of breath feels at baseline.   Examination:  General exam: Appears calm and comfortable.  Chronically ill Respiratory system: Bibasilar crackles Cardiovascular system: S1 & S2 heard, RRR. No JVD, murmurs, rubs, gallops or clicks. No pedal edema. Gastrointestinal system: Abdomen is nondistended, soft and nontender. No organomegaly or masses felt. Normal bowel sounds heard. Central nervous system: Alert and oriented. No focal neurological deficits. Extremities: Symmetric 5 x 5 power. Skin: Chronic ulcer and edema in his lower extremity Psychiatry: Judgement and insight appear normal. Mood & affect appropriate.  Objective: Vitals:   03/11/23 1919 03/11/23 2341 03/12/23 0340 03/12/23 0737  BP: (!) 88/71 (!) 86/63 (!) 84/66 (!) 87/64  Pulse: 95 96 99 100  Resp: 18 18 20 20   Temp: 98.2 F (36.8 C) 98.1 F (36.7 C) 98 F  (36.7 C) 98 F (36.7 C)  TempSrc:   Oral   SpO2: 98% 95% 93% 91%  Weight:      Height:        Intake/Output Summary (Last 24 hours) at 03/12/2023 0831 Last data filed at 03/12/2023 0340 Gross per 24 hour  Intake 120 ml  Output 400 ml  Net -280 ml   Filed Weights   03/07/23 0928 03/10/23 0419 03/11/23 0912  Weight: 83.8 kg 87.3 kg 84.5 kg     Data Reviewed:   CBC: Recent Labs  Lab 03/05/23 1228 03/06/23 0525 03/08/23 0535  WBC 11.0* 10.0 6.7  HGB 8.9* 8.6* 8.2*  HCT 29.6* 28.9* 26.8*  MCV 102.8* 104.3* 99.6  PLT 196 170 [id number]*   Basic Metabolic Panel: Recent Labs  Lab 03/06/23 0525 03/07/23 0713 03/08/23 0535 03/09/23 0943 03/11/23 0850  NA 135 137 135 134* 136  K 4.6 4.3 4.0 4.1 4.4  CL 104 96* 98 95* 98  CO2 27 33* 30 30 33*  GLUCOSE 90 102* 117* 108* 118*  BUN 29* 36* 42* 44* 47*  CREATININE 2.03* 2.12* 2.19* 2.46* 2.39*  CALCIUM 8.8* 9.8 8.6* 8.9 8.7*  MG 1.8 1.8  --   --   --   PHOS  --   --  3.5  --   --    GFR: Estimated Creatinine Clearance: 27.6 mL/min (A) (by C-G formula based on SCr of 2.39 mg/dL (H)). Liver Function Tests: Recent Labs  Lab 03/08/23 0535  ALBUMIN 2.5*   No results for input(s): "LIPASE", "AMYLASE" in the last 168 hours. No results for input(s): "AMMONIA" in the last 168 hours. Coagulation Profile: No results for input(s): "INR", "PROTIME" in the last 168 hours. Cardiac Enzymes: No results for input(s): "CKTOTAL", "CKMB", "CKMBINDEX", "TROPONINI" in the last 168 hours. BNP (last 3 results) No results for input(s): "PROBNP" in the last 8760 hours. HbA1C: No results for input(s): "HGBA1C" in the last 72 hours. CBG: No results for input(s): "GLUCAP" in the last 168 hours. Lipid Profile: No results for input(s): "CHOL", "HDL", "LDLCALC", "TRIG", "CHOLHDL", "LDLDIRECT" in the last 72 hours. Thyroid Function Tests: No results for input(s): "TSH", "T4TOTAL", "FREET4", "T3FREE", "THYROIDAB" in the last 72 hours. Anemia  Panel: No results for input(s): "VITAMINB12", "FOLATE", "FERRITIN", "TIBC", "IRON", "RETICCTPCT" in the last 72 hours. Sepsis Labs: No results for input(s): "PROCALCITON", "LATICACIDVEN" in the last 168 hours.  No results found for this or any previous visit (from the past 240 hour(s)).       Radiology Studies: No results found.      Scheduled Meds:  ascorbic acid  500 mg Oral BID   bacitracin   Topical BID   calcitRIOL  0.25 mcg Oral Daily   feeding supplement  237 mL Oral TID BM   gabapentin  100 mg Oral BID   heparin injection (subcutaneous)  5,000 Units Subcutaneous Q12H   iron polysaccharides  150 mg Oral Daily   midodrine  5 mg Oral TID WC   multivitamin  1 tablet Oral QHS   pantoprazole  40 mg Oral BID   Continuous Infusions:   LOS: 7 days   Time spent= 35 mins    Caeleigh Prohaska Arsenio Loader, MD  [facility]  If 7PM-7AM, please contact night-coverage  03/12/2023, 8:31 AM

## 2023-03-12 NOTE — Progress Notes (Signed)
[name]  PROGRESS NOTE   Subjective:   Patient seen sitting up in chair Slowly eating breakfast Patient denies dizziness or weakness Remains on 3 L nasal cannula  Creatinine 2.39   Objective:  Vital signs: Blood pressure (!) 87/64, pulse 100, temperature 98 F (36.7 C), resp. rate 20, height 5\' 11"  (1.803 m), weight 86.2 kg, SpO2 91 %.  Intake/Output Summary (Last 24 hours) at 03/12/2023 1135 Last data filed at 03/12/2023 1040 Gross per 24 hour  Intake 260 ml  Output 200 ml  Net 60 ml    Filed Weights   03/10/23 0419 03/11/23 0912 03/12/23 0735  Weight: 87.3 kg 84.5 kg 86.2 kg     Physical Exam: General:  No acute distress, sitting in chair  Head:  Normocephalic, atraumatic. Moist oral mucosal membranes  Eyes:  Anicteric  Lungs:   Diminished bilaterally  Heart:  Regular   Abdomen:   Soft, nontender, bowel sounds present  Extremities: + peripheral edema.  Neurologic:  Awake, alert, following commands  Skin:  No lesions  Access: None    Basic Metabolic Panel: Recent Labs  Lab 03/06/23 0525 03/07/23 0713 03/08/23 0535 03/09/23 0943 03/11/23 0850 03/12/23 1004  NA 135 137 135 134* 136  --   K 4.6 4.3 4.0 4.1 4.4  --   CL 104 96* 98 95* 98  --   CO2 27 33* 30 30 33*  --   GLUCOSE 90 102* 117* 108* 118*  --   BUN 29* 36* 42* 44* 47*  --   CREATININE 2.03* 2.12* 2.19* 2.46* 2.39*  --   CALCIUM 8.8* 9.8 8.6* 8.9 8.7*  --   MG 1.8 1.8  --   --   --   --   PHOS  --   --  3.5  --   --  3.4    GFR: Estimated Creatinine Clearance: 27.6 mL/min (A) (by C-G formula based on SCr of 2.39 mg/dL (H)).  Liver Function Tests: Recent Labs  Lab 03/08/23 0535  ALBUMIN 2.5*    No results for input(s): "LIPASE", "AMYLASE" in the last 168 hours. No results for input(s): "AMMONIA" in the last 168 hours.  CBC: Recent Labs  Lab 03/05/23 1228 03/06/23 0525 03/08/23 0535  WBC 11.0* 10.0 6.7  HGB 8.9* 8.6* 8.2*  HCT 29.6* 28.9* 26.8*  MCV 102.8*  104.3* 99.6  PLT 196 170 145*      HbA1C: Hgb A1c MFr Bld  Date/Time Value Ref Range Status  10/02/2022 11:16 PM 6.3 (H) 4.8 - 5.6 % Final    Comment:    (NOTE) Pre diabetes:          5.7%-6.4%  Diabetes:              >6.4%  Glycemic control for   <7.0% adults with diabetes     Urinalysis: No results for input(s): "COLORURINE", "LABSPEC", "PHURINE", "GLUCOSEU", "HGBUR", "BILIRUBINUR", "KETONESUR", "PROTEINUR", "UROBILINOGEN", "NITRITE", "LEUKOCYTESUR" in the last 72 hours.  Invalid input(s): "APPERANCEUR"    Imaging: No results found.   Medications:      ascorbic acid  500 mg Oral BID   bacitracin   Topical BID   calcitRIOL  0.25 mcg Oral Daily   feeding supplement  237 mL Oral TID BM   gabapentin  100 mg Oral BID   heparin injection (subcutaneous)  5,000 Units Subcutaneous Q12H   iron polysaccharides  150 mg Oral Daily   midodrine  10 mg Oral TID WC  multivitamin  1 tablet Oral QHS   pantoprazole  40 mg Oral BID    Assessment/ Plan:     John [name] is a 77 y.o. black male with chronic diastolic congestive heart failure, hypertension, COPD, hyperlipidemia, who is admitted to [facility] on 03/05/2023 for Acute pulmonary edema (HCC) [J81.0] Acute on chronic diastolic CHF (congestive heart failure) (HCC) [I50.33] Acute on chronic respiratory failure with hypoxia (HCC) [J96.21] Acute on chronic congestive heart failure, unspecified heart failure type ([place]) [I50.9]   #1: Chronic kidney disease stage IIIB: creatinine baseline 2.19, GFR of 31. Creatinine slightly increased from yesterday to 2.46. Patient did require dialysis on last admission due to acute cardiorenal syndrome, GI bleed and then with hypovolemic shock.  Creatinine appears stable.  Patient given IV furosemide 40 mg yesterday, questionable urine output recorded.  IV diuresis held today due to hypotension overnight.   #2: Acute respiratory failure secondary to acute exacerbation of chronic diastolic  congestive heart failure. Patient's admission weight is approximately 4 kg above his discharge weight from earlier in the week.  Continue supportive care.   #3: Hypotension: no longer on blood pressure agent.   Remains hypotensive, diuretics held.  Patient receiving scheduled midodrine 10 mg 3 times daily.  #4: Anemia with chronic kidney disease and acute blood loss with recent GI bleed. Hemoglobin is stable however low.  Normocytic.   LOS: [facility] 3/27/202411:35 AM

## 2023-03-12 NOTE — Plan of Care (Signed)

## 2023-03-12 NOTE — Progress Notes (Signed)
PT Cancellation Note  Patient Details Name: John [name] MRN: [id number] DOB: 04/22/1946   Cancelled Treatment:     BP remains low, pt to receive increased doses of midodrine today. Will re-assess next available date/time per POC.   [name] 03/12/2023, 4:15 PM

## 2023-03-13 ENCOUNTER — Encounter: Payer: Medicare HMO | Admitting: Family

## 2023-03-13 DIAGNOSIS — I5033 Acute on chronic diastolic (congestive) heart failure: Secondary | ICD-10-CM | POA: Diagnosis not present

## 2023-03-13 LAB — CBC
HCT: 30.5 % — ABNORMAL LOW (ref 39.0–52.0)
Hemoglobin: 9.2 g/dL — ABNORMAL LOW (ref 13.0–17.0)
MCH: 30.5 pg (ref 26.0–34.0)
MCHC: 30.2 g/dL (ref 30.0–36.0)
MCV: 101 fL — ABNORMAL HIGH (ref 80.0–100.0)
Platelets: 177 10*3/uL (ref 150–400)
RBC: 3.02 MIL/uL — ABNORMAL LOW (ref 4.22–5.81)
RDW: 19.9 % — ABNORMAL HIGH (ref 11.5–15.5)
WBC: 8.7 10*3/uL (ref 4.0–10.5)
nRBC: 0.7 % — ABNORMAL HIGH (ref 0.0–0.2)

## 2023-03-13 LAB — BASIC METABOLIC PANEL
Anion gap: 11 (ref 5–15)
BUN: 63 mg/dL — ABNORMAL HIGH (ref 8–23)
CO2: 28 mmol/L (ref 22–32)
Calcium: 9.4 mg/dL (ref 8.9–10.3)
Chloride: 94 mmol/L — ABNORMAL LOW (ref 98–111)
Creatinine, Ser: 3.29 mg/dL — ABNORMAL HIGH (ref 0.61–1.24)
GFR, Estimated: 19 mL/min — ABNORMAL LOW (ref >60)
Glucose, Bld: 133 mg/dL — ABNORMAL HIGH (ref 70–99)
Potassium: 5.3 mmol/L — ABNORMAL HIGH (ref 3.5–5.1)
Sodium: 133 mmol/L — ABNORMAL LOW (ref 135–145)

## 2023-03-13 LAB — MAGNESIUM: Magnesium: 2 mg/dL (ref 1.7–2.4)

## 2023-03-13 NOTE — Progress Notes (Signed)
John [name] NOTE       Patient ID: [name] MRN: [id number] DOB/AGE: [birth year] 77 y.o.  Admit date: 03/05/2023 Referring Physician Dr. Gerlean Ren Primary Physician Dr. Ginette Pitman Primary Cardiologist Dr. Nehemiah Massed Reason for Consultation AoCHF  HPI: John [name] is a 77yoM with a PMH of HFpEF (LVEF 50-55%, RV failure with severely elevated RVSP (62.47mmHg), mod RA dilation, mi-mod TR and MR 01/2023), COPD (2L baseline), CKD 3, chronic thrombocytopenia, HTN, hx tobacco use with recent prolonged hospital admission for volume overload and respiratory failure requiring mechanical ventilation in the setting of renal failure requiring CRRT and acute on chronic HFpEF c/b UGIB from a duodenal ulcer. He was discharged to a SNF on 3/19 off of diuretics. Presented to [facility] 3/20 with shortness of breath, cardiology is consulted on 3/27 for assistance with his CHF.   Interval History:  -Creatinine with significant increase from 2.39 and GFR of 27 on 3/26 up to 3.29 and 19 respectively today after 2 doses of IV Lasix 20 mg. -Minimally conversant, doesn't make eye contact with me. Denies chest pain, shortness of breath, change in his peripheral edema, lightheadedness or dizziness. -BP okay on midodrine   Review of systems complete and found to be negative unless listed above     Past Medical History:  Diagnosis Date   CHF (congestive heart failure) (HCC)    EF 45% in 2021   Chronic kidney disease    COPD (chronic obstructive pulmonary disease) ([place]) 02/16/2020   Hyperlipidemia    Hypertension    Pneumonia 2021    Past Surgical History:  Procedure Laterality Date   CATARACT EXTRACTION W/PHACO Left 10/31/2021   Procedure: CATARACT EXTRACTION PHACO AND INTRAOCULAR LENS PLACEMENT ([place]) LEFT VISION BLUE 3.45 00:48.0;  Surgeon: Leandrew Koyanagi, MD;  Location: [place];  Service: Ophthalmology;  Laterality: Left;   CATARACT EXTRACTION W/PHACO Right 11/14/2021    Procedure: CATARACT EXTRACTION PHACO AND INTRAOCULAR LENS PLACEMENT ([facility]) RIGHT 8.59 01:10.5;  Surgeon: Leandrew Koyanagi, MD;  Location: [place];  Service: Ophthalmology;  Laterality: Right;   DIALYSIS/PERMA CATHETER INSERTION Right 02/10/2023   Procedure: DIALYSIS/PERMA CATHETER INSERTION;  Surgeon: Algernon Huxley, MD;  Location: [place] [facility];  Service: Cardiovascular;  Laterality: Right;   DIALYSIS/PERMA CATHETER REMOVAL Right 02/17/2023   Procedure: DIALYSIS/PERMA CATHETER REMOVAL;  Surgeon: Algernon Huxley, MD;  Location: [place] [facility];  Service: Cardiovascular;  Laterality: Right;   ESOPHAGOGASTRODUODENOSCOPY (EGD) WITH PROPOFOL N/A 02/19/2023   Procedure: ESOPHAGOGASTRODUODENOSCOPY (EGD) WITH PROPOFOL;  Surgeon: Toledo, Benay Pike, MD;  Location: [facility];  Service: Gastroenterology;  Laterality: N/A;   INGUINAL HERNIA REPAIR Right 02/04/2017   Procedure: HERNIA REPAIR INGUINAL ADULT;  Surgeon: Leonie Green, MD;  Location: [facility];  Service: General;  Laterality: Right;   NO PAST SURGERIES     QUADRICEPS TENDON REPAIR Left 02/15/2020   Procedure: REPAIR QUADRICEP TENDON;  Surgeon: Corky Mull, MD;  Location: [facility];  Service: Orthopedics;  Laterality: Left;    Medications Prior to Admission  Medication Sig Dispense Refill Last Dose   ascorbic acid (VITAMIN C) 500 MG tablet Take 1 tablet (500 mg total) by mouth 2 (two) times daily. 30 tablet 0 03/05/2023 at 0800   calcitRIOL (ROCALTROL) 0.25 MCG capsule Take 0.25 mcg by mouth daily.   03/05/2023 at 0800   feeding supplement (ENSURE ENLIVE / ENSURE PLUS) LIQD Take 237 mLs by mouth 3 (three) times daily between meals. 237 mL 12 03/05/2023 at 0800  ipratropium-albuterol (DUONEB) 0.5-2.5 (3) MG/3ML SOLN Inhale 3 mLs into the lungs every 6 (six) hours as needed (wheezing).   prn at unk   multivitamin (RENA-VIT) TABS tablet Take 1 tablet by mouth at bedtime. 30 tablet 0 03/05/2023 at 0800   pantoprazole  (PROTONIX) 40 MG tablet Take 1 tablet (40 mg total) by mouth 2 (two) times daily. 120 tablet 2 03/05/2023 at 0800   Social History   Socioeconomic History   Marital status: Single    Spouse name: Not on file   Number of children: Not on file   Years of education: Not on file   Highest education level: Not on file  Occupational History   Not on file  Tobacco Use   Smoking status: Former    Packs/day: 0.25    Years: 53.00    Additional pack years: 0.00    Total pack years: 13.25    Types: Cigarettes    Quit date: 02/15/2015    Years since quitting: 8.0   Smokeless tobacco: Never  Vaping Use   Vaping Use: Never used  Substance and Sexual Activity   Alcohol use: Yes    Comment: occassional   Drug use: No   Sexual activity: Not on file  Other Topics Concern   Not on file  Social History Narrative   Not on file   Social Determinants of Health   Financial Resource Strain: Low Risk  (07/17/2022)   Overall Financial Resource Strain (CARDIA)    Difficulty of Paying Living Expenses: Not hard at all  Food Insecurity: No Food Insecurity (01/29/2023)   Hunger Vital Sign    Worried About Running Out of Food in the Last Year: Never true    Ran Out of Food in the Last Year: Never true  Transportation Needs: No Transportation Needs (01/29/2023)   PRAPARE - Hydrologist (Medical): No    Lack of Transportation (Non-Medical): No  Physical Activity: Sufficiently Active (07/17/2022)   Exercise Vital Sign    Days of Exercise per Week: 5 days    Minutes of Exercise per Session: 60 min  Stress: No Stress Concern Present (07/17/2022)   [place]    Feeling of Stress : Not at all  Social Connections: [place] (07/17/2022)   Social Connection and Isolation Panel [NHANES]    Frequency of Communication with Friends and Family: More than three times a week    Frequency of Social Gatherings with  Friends and Family: More than three times a week    Attends Religious Services: 1 to 4 times per year    Active Member of [employer] or Organizations: Yes    Attends Archivist Meetings: More than 4 times per year    Marital Status: Living with partner  Intimate Partner Violence: Not At Risk (01/29/2023)   Humiliation, Afraid, Rape, and Kick questionnaire    Fear of Current or Ex-Partner: No    Emotionally Abused: No    Physically Abused: No    Sexually Abused: No    Family History  Problem Relation Age of Onset   Diabetes Mother    Diabetes Brother    Diabetes Maternal Grandmother    Diabetes Paternal Grandmother       Intake/Output Summary (Last 24 hours) at 03/13/2023 1110 Last data filed at 03/13/2023 1044 Gross per 24 hour  Intake 520 ml  Output 400 ml  Net 120 ml     Vitals:  03/12/23 1936 03/12/23 2332 03/13/23 0458 03/13/23 0732  BP: 91/72 102/72 (!) 83/61 (!) 87/63  Pulse: 87 84 83 82  Resp: 18 18 20 14   Temp: (!) 97.4 F (36.3 C) 97.8 F (36.6 C) 97.6 F (36.4 C) 98.9 F (37.2 C)  TempSrc:    Oral  SpO2: 93%   91%  Weight:      Height:        PHYSICAL EXAM General: Chronically ill appearing bal , well nourished, in no acute distress. HEENT:  Normocephalic and atraumatic. Neck:  No JVD.  Lungs: Normal respiratory effort on 3 L by nasal cannula.  Decreased breath sounds with bibasilar crackles.   Heart: HRRR . Normal S1 and S2 without gallops or murmurs.  Abdomen: Non-distended appearing.  Msk: Normal strength and tone for age. Extremities: Warm to touch.  Trace bilateral lower extremity edema.  Neuro: Alert and oriented X 3. Psych:  Flat affect. Answers yes or no questions   Labs: Basic Metabolic Panel: Recent Labs    03/11/23 0850 03/12/23 1004 03/13/23 0843  NA 136  --  133*  K 4.4  --  5.3*  CL 98  --  94*  CO2 33*  --  28  GLUCOSE 118*  --  133*  BUN 47*  --  63*  CREATININE 2.39*  --  3.29*  CALCIUM 8.7*  --  9.4  MG  --   --   2.0  PHOS  --  3.4  --     Liver Function Tests: No results for input(s): "AST", "ALT", "ALKPHOS", "BILITOT", "PROT", "ALBUMIN" in the last 72 hours. No results for input(s): "LIPASE", "AMYLASE" in the last 72 hours. CBC: Recent Labs    03/12/23 1218 03/13/23 0843  WBC 9.4 8.7  HGB 9.3* 9.2*  HCT 31.6* 30.5*  MCV 104.3* 101.0*  PLT 166 177   Cardiac Enzymes: No results for input(s): "CKTOTAL", "CKMB", "CKMBINDEX", "TROPONINIHS" in the last 72 hours. BNP: Recent Labs    03/12/23 1004  BNP 3,834.7*   D-Dimer: No results for input(s): "DDIMER" in the last 72 hours. Hemoglobin A1C: No results for input(s): "HGBA1C" in the last 72 hours. Fasting Lipid Panel: No results for input(s): "CHOL", "HDL", "LDLCALC", "TRIG", "CHOLHDL", "LDLDIRECT" in the last 72 hours. Thyroid Function Tests: No results for input(s): "TSH", "T4TOTAL", "T3FREE", "THYROIDAB" in the last 72 hours.  Invalid input(s): "FREET3" Anemia Panel: No results for input(s): "VITAMINB12", "FOLATE", "FERRITIN", "TIBC", "IRON", "RETICCTPCT" in the last 72 hours.   Radiology: [facility] Chest Port 1 View  Result Date: 03/05/2023 CLINICAL DATA:  Shortness of breath. EXAM: PORTABLE CHEST 1 VIEW COMPARISON:  February 04, 2023. FINDINGS: Stable cardiomediastinal silhouette. Mild right basilar atelectasis or infiltrate is noted with associated pleural effusion. Left lung is unremarkable. Bony thorax is unremarkable. IMPRESSION: Mild right basilar atelectasis or infiltrate is noted with associated pleural effusion. Electronically Signed   By: Marijo Conception M.D.   On: 03/05/2023 13:10   CT ANGIO GI BLEED  Result Date: 02/19/2023 CLINICAL DATA:  Bloody bowel movement. Concern for active gastrointestinal bleeding. EXAM: CTA ABDOMEN AND PELVIS WITHOUT AND WITH CONTRAST TECHNIQUE: Multidetector CT imaging of the abdomen and pelvis was performed using the standard protocol during bolus administration of intravenous contrast. Multiplanar  reconstructed images and MIPs were obtained and reviewed to evaluate the vascular anatomy. RADIATION DOSE REDUCTION: This exam was performed according to the departmental dose-optimization program which includes automated exposure control, adjustment of the mA and/or kV according to  patient size and/or use of iterative reconstruction technique. CONTRAST:  25mL OMNIPAQUE IOHEXOL 350 MG/ML SOLN COMPARISON:  None Available. FINDINGS: VASCULAR Aorta: Atherosclerotic calcification of the aorta. Celiac: Patent without evidence of aneurysm, dissection, vasculitis or significant stenosis. SMA: Patent without evidence of aneurysm, dissection, vasculitis or significant stenosis. Renals: Both renal arteries are patent without evidence of aneurysm, dissection, vasculitis, fibromuscular dysplasia or significant stenosis. IMA: Patent without evidence of aneurysm, dissection, vasculitis or significant stenosis. Inflow: Patent without evidence of aneurysm, dissection, vasculitis or significant stenosis. Proximal Outflow: Aortic calcification. Atherosclerotic calcification. No aneurysm. Veins: No obvious venous abnormality within the limitations of this arterial phase study. Review of the MIP images confirms the above findings. NON-VASCULAR Lower chest: Lung bases are clear. Hepatobiliary: No focal hepatic lesion. Normal gallbladder. No biliary duct dilatation. Common bile duct is normal. Pancreas: Pancreas is normal. No ductal dilatation. No pancreatic inflammation. Spleen: Normal spleen Adrenals/urinary tract: Adrenal glands and kidneys are normal. The ureters and bladder normal. Stomach/Bowel: Stomach is normal. On delayed portal venous phase imaging there is a collection of endoluminal contrast within the second portion the duodenum (image 32/11). Findings consistent with active CT chest of bleeding. On arterial phase imaging there is a tiny focus of intraluminal contrast at this site (image 67/5). No evidence of active  gastrointestinal bleeding within the small bowel. No small bowel inflammation or dilatation. There is high-density material within the colon. No evidence of active extravasation IV contrast into the lumen of the LEFT or RIGHT colon. Rectosigmoid colon likewise unremarkable except for several diverticula. Rectum unremarkable. Vascular/Lymphatic: Abdominal aorta is normal caliber with atherosclerotic calcification. There is no retroperitoneal or periportal lymphadenopathy. No pelvic lymphadenopathy. Reproductive: Prostate unremarkable Other: Mild sarcoma of the soft tissues. Musculoskeletal: Degenerative osteophytosis of the spine. IMPRESSION: VASCULAR 1. Active gastrointestinal bleeding in the second portion duodenum evident on the portal venous phase imaging. 2. Atherosclerotic calcification of the aorta is branches without high-grade stenosis. NON-VASCULAR 1. No evidence of bowel obstruction or inflammation. These results will be called to the ordering clinician or representative by the Radiologist Assistant, and communication documented in the PACS or [employer]. Electronically Signed   By: Suzy Bouchard M.D.   On: 02/19/2023 13:59   PERIPHERAL VASCULAR CATHETERIZATION  Result Date: 02/17/2023 See surgical note for result.  [place] UE VEIN MAPPING LEFT (PRE-OP AVF)  Result Date: 02/12/2023 CLINICAL DATA:  End-stage renal disease. Please perform left upper extremity venous mapping prior to attempted dialysis access creation. EXAM: [place] EXTREM UP VEIN MAPPING COMPARISON:  None Available. FINDINGS: LEFT ARTERIES Wrist Radial Artery: Size 2.6 mm Waveform Triphasic Wrist Ulnar Artery: Size 2.0 mm Waveform Triphasic Prox. Forearm Radial Artery: Size 2.4 mm Waveform Triphasic Upper Arm Brachial Artery: Note is made of a high division of the brachial artery at the level of the proximal humerus (representative image 2). LEFT VEINS Forearm Cephalic Vein: Prox 2.7 mm - Depth 4.7 mm; Distal 2.5 mm - Depth 3.6 mm  Upper Arm Cephalic Vein: Prox 2.3 mm - Depth 4.1 mm; Distal 2.4 mm - Depth 3.9 mm Upper Arm Basilic Vein: Prox 8.4 mm - Depth 8.5 mm; Distal 4.4 mm - Depth 9.0 mm Upper Arm Radial vein (secondary to proximal division of the brachial artery): Prox 4.1 mm - Depth 9.5 mm; Distal 4.5 mm - Depth 9.0 mm Upper Arm Ulnar vein (secondary to proximal division of the brachial artery): Prox 5.1 mm - Depth 10.6 mm; Distal 3.8 mm - Depth 14.3 mm ADDITIONAL LEFT VEINS Axillary Vein:  18.2 mm Subclavian Vein: Patient: Yes Respiratory Phasicity: Present Internal Jugular Vein: Patent: Yes    Respiratory Phasicity: Present Branches > 2 mm: None though the cephalic vein appears to have 3 separate divisions at the level of the mid/distal forearm (image 49), while the basilic vein has 3 separate divisions at the level of the distal humerus (image 60). IMPRESSION: Left upper extremity venous mapping as detailed above, significant for high division of the brachial artery at the level of the proximal humerus. Electronically Signed   By: Sandi Mariscal M.D.   On: 02/12/2023 09:32    ECHO 01/30/2023  1. Left ventricular ejection fraction, by estimation, is 50 to 55%. The  left ventricle has low normal function. The left ventricle has no regional  wall motion abnormalities. There is mild left ventricular hypertrophy.  Left ventricular diastolic  parameters are consistent with Grade I diastolic dysfunction (impaired  relaxation). There is the interventricular septum is flattened in systole  and diastole, consistent with right ventricular pressure and volume  overload.   2. Right ventricular systolic function is severely reduced. The right  ventricular size is moderately enlarged. There is severely elevated  pulmonary artery systolic pressure. The estimated right ventricular  systolic pressure is [id number] mmHg.   3. Right atrial size was moderately dilated.   4. The mitral valve is normal in structure. Mild mitral valve  regurgitation.  No evidence of mitral stenosis. There is mild late systolic  prolapse of both leaflets of the mitral valve.   5. Tricuspid valve regurgitation is mild to moderate.   6. The aortic valve is tricuspid. Aortic valve regurgitation is mild to  moderate. No aortic stenosis is present.   7. The inferior vena cava is dilated in size with <50% respiratory  variability, suggesting right atrial pressure of 15 mmHg.   8. There is mild dilatation of the aortic root, measuring 44 mm. There is  mild dilatation of the ascending aorta, measuring 43 mm.   TELEMETRY reviewed by me (LT) 03/13/2023 : Sinus rhythm to sinus tachycardia rate 98-108.  EKG reviewed by me: Sinus rhythm with nonspecific T wave abnormality rate 96 bpm  Data reviewed by me (LT) 03/13/2023: Hospitalist and nephrology progress notes, discharge summary from February/March hospitalization this year, last 24h vitals tele labs imaging I/O   Principal Problem:   Acute on chronic diastolic CHF (congestive heart failure) ([place]) Active Problems:   COPD (chronic obstructive pulmonary disease) ([place])   Hypertension   CKD (chronic kidney disease), stage IV (HCC)   Macrocytic anemia   Duodenal ulcer   Open wound_in posterior left lower leg    ASSESSMENT AND PLAN:  John [name] is a 51yoM with a PMH of HFpEF (LVEF 50-55%, RV failure with severely elevated RVSP (62.64mmHg), mod RA dilation, mi-mod TR and MR 01/2023), COPD (2L baseline), CKD 3, chronic thrombocytopenia, HTN, hx tobacco use with recent prolonged hospital admission for volume overload and respiratory failure requiring mechanical ventilation in the setting of renal failure requiring CRRT and acute on chronic HFpEF c/b UGIB from a duodenal ulcer. He was discharged to a SNF on 3/19 off of diuretics. Presented to [facility] 3/20 with shortness of breath, cardiology is consulted on 3/27 for assistance with his CHF.   # Acute on chronic HFpEF Known history of RV systolic dysfunction, BNP uptrending  from 2500 to 3800 after diuretics were held for 3 days due to worsening creatinine/low BPs.  Clinically hypervolemic with lower extremity edema and crackles to auscultation.  Poor  baseline functional status and minimal mobility recently only transferring from recliner to bed per patient.  -s/p IV Lasix 20 mg x2 doses with significant increase in Cr. Still making urine. Hold further diuresis -continue midodrine 10 mg 3 times daily. -Other GDMT limited by low BP and tenuous renal function -No additional cardiac diagnostics planned at this time.  # CKD III Baseline renal function with BUN/creatinine 32/2.13 and GFR 31 on 3/20.  BUN/creatinine 47/2.39 and GFR of 27 uptrending after restarting IV lasix to 3.29 and GFR 19. Continue to monitor closely. Hold further diuresis today   This patient's plan of care was discussed and created with Dr. Clayborn Bigness and he is in agreement.  Signed: Tristan Schroeder , PA-C 03/13/2023, 11:10 AM [facility] Cardiology

## 2023-03-13 NOTE — Progress Notes (Signed)
PROGRESS NOTE    John [name]  [id number] DOB: 08-Feb-1946 DOA: 03/05/2023 PCP: Alisa Graff, FNP   Brief Narrative:   77 y.o. male with medical history significant of hypertension, hyperlipidemia, COPD on 3 L oxygen, diastolic CHF CKD stage IV, duodenal ulcer with bleeding, recent admission due to hypovolemic and cardiogenic shock, who presents with shortness of breath.  Patient was recently hospitalized from 2/14 - 3/19 for hypovolemic/cardiogenic shock in the setting of duodenal bleeding ulcer.  Upon discharge his losartan, metolazone and torsemide were held.  Now coming in with CHF exacerbation.  During this admission he has been cautiously diuresed but due to soft blood pressure this is off-and-on been held.  He was also started on midodrine.  Nephrology team has been following the patient due to CKD stage IV but no indication for hemodialysis yet.  With full soft blood pressure and elevated creatinine, diabetes cautiously.   Assessment & Plan:  Principal Problem:   Acute on chronic diastolic CHF (congestive heart failure) (HCC) Active Problems:   CKD (chronic kidney disease), stage IV (HCC)   Hypertension   COPD (chronic obstructive pulmonary disease) (HCC)   Macrocytic anemia   Duodenal ulcer   Open wound_in posterior left lower leg    Acute on chronic diastolic CHF (congestive heart failure) ([place]); Class III : 2D echo on 01/30/2023 showed EF of 50-55% with grade 1 diastolic dysfunction.  Due to recent hypovolemic shock, patient's diuretics were on hold.  He developed bilateral leg edema, shortness breath, positive JVD, elevated BNP 2659, clinically consistent with CHF exacerbation. - Seen by cardiology, soft blood pressure therefore midodrine.  Holding diuretics today due to elevated creatinine   CKD (chronic kidney disease), stage IV (HCC) Baseline creatinine around 2.1.  Elevated today, hold diuretics   Hypertension:  -IV hydralazine as needed but due to hypotension he  is on midodrine.   COPD (chronic obstructive pulmonary disease) (HCC) -Bronchodilators   Macrocytic anemia:  Hemoglobin 8.2 (8.9 03/04/2023 at the discharge)   Duodenal ulcer -continue protonix 40 mg bid   Open wound_in posterior left lower leg: Seen by wound care team.  No obvious evidence of infection.  Wound care recommendations:Topical treatment orders provided for bedside nurses to perform as follows to absorb drainage and provide antimicrobial benefits: Apply Aquacel Kellie Simmering # [phone]) to left leg wound Q day, then cover with ABD pad and kerlex.    Pallaitive care input noted--pt is wanting to be FULL code. Overall poor prognosis  Per TOC pt has bed offer at [facility], [facility], [facility] communication : Consults :nephrology CODE STATUS: Full DVT Prophylaxis :SCD Level of care: Progressive Status is: Inpatient Remains inpatient appropriate because: CHF   Overall long term poor prognosis.  Ongoing management of his volume status, complicated by soft blood pressure pain renal function    Subjective: Seen and examined at bedside.  No complaints  Examination: Constitutional: Not in acute distress Respiratory: Bibasilar crackles Cardiovascular: Normal sinus rhythm, no rubs Abdomen: Nontender nondistended good bowel sounds Musculoskeletal: No edema noted Skin: No rashes seen Neurologic: Chronic ulcer on his lower extremity with some edema Psychiatric: Normal judgment and insight. Alert and oriented x 3. Normal mood.   Objective: Vitals:   03/12/23 1936 03/12/23 2332 03/13/23 0458 03/13/23 0732  BP: 91/72 102/72 (!) 83/61 (!) 87/63  Pulse: 87 84 83 82  Resp: 18 18 20 14   Temp: (!) 97.4 F (36.3 C) 97.8 F (36.6 C) 97.6 F (36.4 C) 98.9 F (37.2 C)  TempSrc:  Oral  SpO2: 93%   91%  Weight:      Height:        Intake/Output Summary (Last 24 hours) at 03/13/2023 0826 Last data filed at 03/13/2023 0736 Gross per 24 hour  Intake 440 ml  Output 400 ml  Net 40 ml    Filed Weights   03/10/23 0419 03/11/23 0912 03/12/23 0735  Weight: 87.3 kg 84.5 kg 86.2 kg     Data Reviewed:   CBC: Recent Labs  Lab 03/08/23 0535 03/12/23 1218  WBC 6.7 9.4  HGB 8.2* 9.3*  HCT 26.8* 31.6*  MCV 99.6 104.3*  PLT 145* [id number]   Basic Metabolic Panel: Recent Labs  Lab 03/07/23 0713 03/08/23 0535 03/09/23 0943 03/11/23 0850 03/12/23 1004  NA 137 135 134* 136  --   K 4.3 4.0 4.1 4.4  --   CL 96* 98 95* 98  --   CO2 33* 30 30 33*  --   GLUCOSE 102* 117* 108* 118*  --   BUN 36* 42* 44* 47*  --   CREATININE 2.12* 2.19* 2.46* 2.39*  --   CALCIUM 9.8 8.6* 8.9 8.7*  --   MG 1.8  --   --   --   --   PHOS  --  3.5  --   --  3.4   GFR: Estimated Creatinine Clearance: 27.6 mL/min (A) (by C-G formula based on SCr of 2.39 mg/dL (H)). Liver Function Tests: Recent Labs  Lab 03/08/23 0535  ALBUMIN 2.5*   No results for input(s): "LIPASE", "AMYLASE" in the last 168 hours. No results for input(s): "AMMONIA" in the last 168 hours. Coagulation Profile: No results for input(s): "INR", "PROTIME" in the last 168 hours. Cardiac Enzymes: No results for input(s): "CKTOTAL", "CKMB", "CKMBINDEX", "TROPONINI" in the last 168 hours. BNP (last 3 results) No results for input(s): "PROBNP" in the last 8760 hours. HbA1C: No results for input(s): "HGBA1C" in the last 72 hours. CBG: No results for input(s): "GLUCAP" in the last 168 hours. Lipid Profile: No results for input(s): "CHOL", "HDL", "LDLCALC", "TRIG", "CHOLHDL", "LDLDIRECT" in the last 72 hours. Thyroid Function Tests: No results for input(s): "TSH", "T4TOTAL", "FREET4", "T3FREE", "THYROIDAB" in the last 72 hours. Anemia Panel: No results for input(s): "VITAMINB12", "FOLATE", "FERRITIN", "TIBC", "IRON", "RETICCTPCT" in the last 72 hours. Sepsis Labs: No results for input(s): "PROCALCITON", "LATICACIDVEN" in the last 168 hours.  No results found for this or any previous visit (from the past 240 hour(s)).        Radiology Studies: No results found.      Scheduled Meds:  ascorbic acid  500 mg Oral BID   bacitracin   Topical BID   calcitRIOL  0.25 mcg Oral Daily   feeding supplement  237 mL Oral TID BM   furosemide  20 mg Intravenous Q12H   gabapentin  100 mg Oral BID   heparin injection (subcutaneous)  5,000 Units Subcutaneous Q12H   iron polysaccharides  150 mg Oral Daily   midodrine  10 mg Oral TID WC   multivitamin  1 tablet Oral QHS   pantoprazole  40 mg Oral BID   Continuous Infusions:   LOS: 8 days   Time spent= 35 mins    Sherae Santino Arsenio Loader, MD [facility]  If 7PM-7AM, please contact night-coverage  03/13/2023, 8:26 AM

## 2023-03-13 NOTE — Progress Notes (Signed)
[name]  PROGRESS NOTE   Subjective:   Patient seen sitting up in bed States he did not rest well overnight Breakfast tray at bedside Remains on 2L [place]  Creatinine 3.29 (2.39)   Objective:  Vital signs: Blood pressure (!) 114/93, pulse 95, temperature (!) 97.5 F (36.4 C), resp. rate (!) 22, height 5\' 11"  (1.803 m), weight 86.2 kg, SpO2 91 %.  Intake/Output Summary (Last 24 hours) at 03/13/2023 1130 Last data filed at 03/13/2023 1044 Gross per 24 hour  Intake 520 ml  Output 400 ml  Net 120 ml    Filed Weights   03/10/23 0419 03/11/23 0912 03/12/23 0735  Weight: 87.3 kg 84.5 kg 86.2 kg     Physical Exam: General:  No acute distress  Head:  Normocephalic, atraumatic. Moist oral mucosal membranes  Eyes:  Anicteric  Lungs:   Diminished bilaterally  Heart:  Regular   Abdomen:   Soft, nontender, bowel sounds present  Extremities: + peripheral edema.  Neurologic:  Awake, alert, following commands  Skin:  No lesions  Access: None    Basic Metabolic Panel: Recent Labs  Lab 03/07/23 0713 03/08/23 0535 03/09/23 0943 03/11/23 0850 03/12/23 1004 03/13/23 0843  NA 137 135 134* 136  --  133*  K 4.3 4.0 4.1 4.4  --  5.3*  CL 96* 98 95* 98  --  94*  CO2 33* 30 30 33*  --  28  GLUCOSE 102* 117* 108* 118*  --  133*  BUN 36* 42* 44* 47*  --  63*  CREATININE 2.12* 2.19* 2.46* 2.39*  --  3.29*  CALCIUM 9.8 8.6* 8.9 8.7*  --  9.4  MG 1.8  --   --   --   --  2.0  PHOS  --  3.5  --   --  3.4  --     GFR: Estimated Creatinine Clearance: 20 mL/min (A) (by C-G formula based on SCr of 3.29 mg/dL (H)).  Liver Function Tests: Recent Labs  Lab 03/08/23 0535  ALBUMIN 2.5*    No results for input(s): "LIPASE", "AMYLASE" in the last 168 hours. No results for input(s): "AMMONIA" in the last 168 hours.  CBC: Recent Labs  Lab 03/08/23 0535 03/12/23 1218 03/13/23 0843  WBC 6.7 9.4 8.7  HGB 8.2* 9.3* 9.2*  HCT 26.8* 31.6* 30.5*  MCV 99.6 104.3* 101.0*   PLT 145* 166 177      HbA1C: Hgb A1c MFr Bld  Date/Time Value Ref Range Status  10/02/2022 11:16 PM 6.3 (H) 4.8 - 5.6 % Final    Comment:    (NOTE) Pre diabetes:          5.7%-6.4%  Diabetes:              >6.4%  Glycemic control for   <7.0% adults with diabetes     Urinalysis: No results for input(s): "COLORURINE", "LABSPEC", "PHURINE", "GLUCOSEU", "HGBUR", "BILIRUBINUR", "KETONESUR", "PROTEINUR", "UROBILINOGEN", "NITRITE", "LEUKOCYTESUR" in the last 72 hours.  Invalid input(s): "APPERANCEUR"    Imaging: No results found.   Medications:      ascorbic acid  500 mg Oral BID   bacitracin   Topical BID   calcitRIOL  0.25 mcg Oral Daily   feeding supplement  237 mL Oral TID BM   gabapentin  100 mg Oral BID   heparin injection (subcutaneous)  5,000 Units Subcutaneous Q12H   iron polysaccharides  150 mg Oral Daily   midodrine  10 mg Oral TID WC  multivitamin  1 tablet Oral QHS   pantoprazole  40 mg Oral BID    Assessment/ Plan:     John [name] is a 77 y.o. black male with chronic diastolic congestive heart failure, hypertension, COPD, hyperlipidemia, who is admitted to [facility] on 03/05/2023 for Acute pulmonary edema (HCC) [J81.0] Acute on chronic diastolic CHF (congestive heart failure) (HCC) [I50.33] Acute on chronic respiratory failure with hypoxia (HCC) [J96.21] Acute on chronic congestive heart failure, unspecified heart failure type ([place]) [I50.9]   #1: Chronic kidney disease stage IIIB: creatinine baseline 2.19, GFR of 31. Creatinine slightly increased from yesterday to 2.46. Patient did require dialysis on last admission due to acute cardiorenal syndrome, GI bleed and then with hypovolemic shock.  Creatinine elevated today after IV Furosemide, now held. Will continue to monitor    #2: Acute respiratory failure secondary to acute exacerbation of chronic diastolic congestive heart failure. Patient's admission weight is approximately 4 kg above his discharge  weight from earlier in the week.  Continue supportive care.   #3: Hypotension: no longer on blood pressure agent.   Remains hypotensive, diuretics held.  Continue midodrine 10 mg 3 times daily.  #4: Anemia with chronic kidney disease and acute blood loss with recent GI bleed. Hemoglobin is stable.  Normocytic.   LOS: [facility] 3/28/202411:30 AM

## 2023-03-13 NOTE — Progress Notes (Signed)
Patient more alert and able to take medications.

## 2023-03-13 NOTE — Progress Notes (Signed)
Patient refused all medication this evening and is slow to respond, however when he does respond, he is irritated.   Patient in bed, with bed alarm active and audible, call bell within reach.

## 2023-03-13 NOTE — Plan of Care (Signed)

## 2023-03-13 NOTE — Care Management Important Message (Signed)
Important Message  Patient Details  Name: John [name] MRN: [id number] Date of Birth: [birth year]   Medicare Important Message Given:  Yes     [name] 03/13/2023, 12:46 PM

## 2023-03-13 NOTE — Progress Notes (Signed)
Physical Therapy Treatment Patient Details Name: John [name] MRN: [id number] DOB: 11-17-1946 Today's Date: 03/13/2023   History of Present Illness Pt is a 77 y/o M admitted on 03/05/23 after presenting with c/c of SOB. Pt was found to have O2 desaturation to 70-80s on home 3L O2. Pt also with painful wound on posterior LLE. Pt is being treated for acute on chronic diastolic CHF. Pt with recent admission 2/14-3/19 due to hypovolemic & cardiogenic shock in the setting of duodenal ulcer bleeding. PMH: HTN, HLD, COPD 3L O2, diastolic CHF, CKD stage IV, duodenal ulcer with bleeding    PT Comments    Nursing in room preparing pt for standing scale weight. Pt assisted with transfers and side stepping to bedside chair with B UE support and MinA for safety. Pt very limited functionally due to B lower leg pain, poor tolerance for mobility due to quick onset of fatigue. Pt requires significant time to process task and complete. Further gait training declined. Continue to recommend SNF once medically cleared for d/c.   Recommendations for follow up therapy are one component of a multi-disciplinary discharge planning process, led by the attending physician.  Recommendations may be updated based on patient status, additional functional criteria and insurance authorization.  Follow Up Recommendations  Can patient physically be transported by private vehicle: No    Assistance Recommended at Discharge Frequent or constant Supervision/Assistance  Patient can return home with the following Direct supervision/assist for medications management;Help with stairs or ramp for entrance;Assistance with feeding;Assist for transportation;Assistance with cooking/housework;Direct supervision/assist for financial management;A lot of help with walking and/or transfers;A lot of help with bathing/dressing/bathroom   Equipment Recommendations  None recommended by PT (TBD at next facility)    Recommendations for Other  Services       Precautions / Restrictions Precautions Precautions: Fall Restrictions Weight Bearing Restrictions: No     Mobility  Bed Mobility Overal bed mobility: Needs Assistance Bed Mobility: Supine to Sit     Supine to sit: Supervision, HOB elevated (Slow processing and significant increased time to complete)          Transfers Overall transfer level: Needs assistance Equipment used: Rolling walker (2 wheels) Transfers: Sit to/from Stand Sit to Stand: Min assist           General transfer comment: increased time required to initiate, VC for hand placement    Ambulation/Gait Ambulation/Gait assistance: Min assist Gait Distance (Feet):  (3) Assistive device: Rolling walker (2 wheels) Gait Pattern/deviations: Decreased step length - right, Decreased dorsiflexion - left, Decreased step length - left, Decreased stride length, Decreased dorsiflexion - right, Decreased stance time - left, Decreased weight shift to left Gait velocity: decreased     General Gait Details:  (Pt declined gait training, able to side step to chair after being weighed on standing scale with nursing)   Stairs             Wheelchair Mobility    Modified Rankin (Stroke Patients Only)       Balance Overall balance assessment: Needs assistance Sitting-balance support: Feet supported Sitting balance-Leahy Scale: Good Sitting balance - Comments: supervision static sitting   Standing balance support: Bilateral upper extremity supported, During functional activity, Reliant on assistive device for balance Standing balance-Leahy Scale: Poor Standing balance comment: limited standing tolerance 2/2 weakness                            Cognition Arousal/Alertness: Awake/alert Behavior During  Therapy: Flat affect, WFL for tasks assessed/performed Overall Cognitive Status: No family/caregiver present to determine baseline cognitive functioning                                  General Comments: follows commands with extra time, delayed processing, requires encouragement to participate        Exercises Total Joint Exercises Ankle Circles/Pumps: Strengthening, Both, 10 reps Long Arc Quad: AROM, AAROM, Strengthening, Both, 10 reps    General Comments General comments (skin integrity, edema, etc.):  (Pt educated on role of PT and benefits of mobility with good understanding.)      Pertinent Vitals/Pain Pain Assessment Pain Assessment: Faces Faces Pain Scale: Hurts little more Pain Location: BLE/feet Pain Descriptors / Indicators: Guarding, Discomfort Pain Intervention(s): Limited activity within patient's tolerance    Home Living                          Prior Function            PT Goals (current goals can now be found in the care plan section) Acute Rehab PT Goals Patient Stated Goal: none stated    Frequency    Min 2X/week      PT Plan Current plan remains appropriate    Co-evaluation              AM-PAC PT "6 Clicks" Mobility   Outcome Measure  Help needed turning from your back to your side while in a flat bed without using bedrails?: A Little Help needed moving from lying on your back to sitting on the side of a flat bed without using bedrails?: A Little Help needed moving to and from a bed to a chair (including a wheelchair)?: A Little Help needed standing up from a chair using your arms (e.g., wheelchair or bedside chair)?: A Little Help needed to walk in hospital room?: A Lot Help needed climbing 3-5 steps with a railing? : Total 6 Click Score: 15    End of Session Equipment Utilized During Treatment: Oxygen Activity Tolerance: Patient tolerated treatment well Patient left: in chair;with call bell/phone within reach Nurse Communication: Mobility status PT Visit Diagnosis: Muscle weakness (generalized) (M62.81);Other abnormalities of gait and mobility (R26.89);Difficulty in walking, not  elsewhere classified (R26.2);Unsteadiness on feet (R26.81)     Time: [id number] PT Time Calculation (min) (ACUTE ONLY): 17 min  Charges:  $Therapeutic Activity: 8-22 mins                    John [name], PTA  John [name] 03/13/2023, 11:50 AM

## 2023-03-14 DIAGNOSIS — I5033 Acute on chronic diastolic (congestive) heart failure: Secondary | ICD-10-CM | POA: Diagnosis not present

## 2023-03-14 LAB — MAGNESIUM: Magnesium: 2.3 mg/dL (ref 1.7–2.4)

## 2023-03-14 LAB — BASIC METABOLIC PANEL
Anion gap: 9 (ref 5–15)
BUN: 72 mg/dL — ABNORMAL HIGH (ref 8–23)
CO2: 29 mmol/L (ref 22–32)
Calcium: 9.4 mg/dL (ref 8.9–10.3)
Chloride: 94 mmol/L — ABNORMAL LOW (ref 98–111)
Creatinine, Ser: 3.96 mg/dL — ABNORMAL HIGH (ref 0.61–1.24)
GFR, Estimated: 15 mL/min — ABNORMAL LOW (ref >60)
Glucose, Bld: 120 mg/dL — ABNORMAL HIGH (ref 70–99)
Potassium: 6.2 mmol/L — ABNORMAL HIGH (ref 3.5–5.1)
Sodium: 132 mmol/L — ABNORMAL LOW (ref 135–145)

## 2023-03-14 LAB — CBC
HCT: 32.7 % — ABNORMAL LOW (ref 39.0–52.0)
Hemoglobin: 9.7 g/dL — ABNORMAL LOW (ref 13.0–17.0)
MCH: 30.5 pg (ref 26.0–34.0)
MCHC: 29.7 g/dL — ABNORMAL LOW (ref 30.0–36.0)
MCV: 102.8 fL — ABNORMAL HIGH (ref 80.0–100.0)
Platelets: 205 K/uL (ref 150–400)
RBC: 3.18 MIL/uL — ABNORMAL LOW (ref 4.22–5.81)
RDW: 19.7 % — ABNORMAL HIGH (ref 11.5–15.5)
WBC: 10.6 K/uL — ABNORMAL HIGH (ref 4.0–10.5)
nRBC: 0.6 % — ABNORMAL HIGH (ref 0.0–0.2)

## 2023-03-14 LAB — AMMONIA: Ammonia: 25 umol/L (ref 9–35)

## 2023-03-14 MED ORDER — INSULIN ASPART 100 UNIT/ML IV SOLN
5.0000 [IU] | INTRAVENOUS | Status: AC
  Administered 2023-03-14: 5 [IU] via INTRAVENOUS
  Filled 2023-03-14: qty 0.05

## 2023-03-14 MED ORDER — SODIUM ZIRCONIUM CYCLOSILICATE 10 G PO PACK
10.0000 g | PACK | Freq: Two times a day (BID) | ORAL | Status: DC
  Administered 2023-03-14 – 2023-03-16 (×4): 10 g via ORAL
  Filled 2023-03-14 (×4): qty 1

## 2023-03-14 MED ORDER — SODIUM ZIRCONIUM CYCLOSILICATE 10 G PO PACK
10.0000 g | PACK | Freq: Three times a day (TID) | ORAL | Status: DC
  Administered 2023-03-14: 10 g via ORAL
  Filled 2023-03-14 (×2): qty 1

## 2023-03-14 MED ORDER — ALBUTEROL SULFATE (2.5 MG/3ML) 0.083% IN NEBU
10.0000 mg | INHALATION_SOLUTION | Freq: Once | RESPIRATORY_TRACT | Status: AC
  Administered 2023-03-14: 10 mg via RESPIRATORY_TRACT
  Filled 2023-03-14: qty 12

## 2023-03-14 MED ORDER — DEXTROSE 50 % IV SOLN
1.0000 | Freq: Once | INTRAVENOUS | Status: AC
  Administered 2023-03-14: 50 mL via INTRAVENOUS
  Filled 2023-03-14: qty 50

## 2023-03-14 NOTE — Progress Notes (Signed)
[name]  PROGRESS NOTE   Subjective:   No complaints. Seated in chair. Denies any shortness of breath or peripheral edema.   Potassium now elevated at 6.2 and creatinine is trending upward to 3.96 today. Foley has been placed. Lokelma ordered.    Objective:  Vital signs: Blood pressure (!) 135/121, pulse 98, temperature 98.7 F (37.1 C), resp. rate 20, height 5\' 11"  (1.803 m), weight 85.5 kg, SpO2 91 %.  Intake/Output Summary (Last 24 hours) at 03/14/2023 1346 Last data filed at 03/13/2023 1923 Gross per 24 hour  Intake 240 ml  Output 200 ml  Net 40 ml    Filed Weights   03/11/23 0912 03/12/23 0735 03/13/23 1133  Weight: 84.5 kg 86.2 kg 85.5 kg     Physical Exam: General:  No acute distress, sitting in chair  Head:  Normocephalic, atraumatic. Moist oral mucosal membranes  Eyes:  Anicteric  Lungs:   Diminished bilaterally  Heart:  Regular   Abdomen:   Soft, nontender, bowel sounds present  Extremities: + peripheral edema.  Neurologic:  Awake, alert, following commands  Skin:  Left lower extremity wound - clean and healing  Access: None    Basic Metabolic Panel: Recent Labs  Lab 03/08/23 0535 03/09/23 0943 03/11/23 0850 03/12/23 1004 03/13/23 0843 03/14/23 0631  NA 135 134* 136  --  133* 132*  K 4.0 4.1 4.4  --  5.3* 6.2*  CL 98 95* 98  --  94* 94*  CO2 30 30 33*  --  28 29  GLUCOSE 117* 108* 118*  --  133* 120*  BUN 42* 44* 47*  --  63* 72*  CREATININE 2.19* 2.46* 2.39*  --  3.29* 3.96*  CALCIUM 8.6* 8.9 8.7*  --  9.4 9.4  MG  --   --   --   --  2.0 2.3  PHOS 3.5  --   --  3.4  --   --     GFR: Estimated Creatinine Clearance: 16.6 mL/min (A) (by C-G formula based on SCr of 3.96 mg/dL (H)).  Liver Function Tests: Recent Labs  Lab 03/08/23 0535  ALBUMIN 2.5*    No results for input(s): "LIPASE", "AMYLASE" in the last 168 hours. Recent Labs  Lab 03/14/23 1039  AMMONIA 25    CBC: Recent Labs  Lab 03/08/23 0535  03/12/23 1218 03/13/23 0843 03/14/23 0631  WBC 6.7 9.4 8.7 10.6*  HGB 8.2* 9.3* 9.2* 9.7*  HCT 26.8* 31.6* 30.5* 32.7*  MCV 99.6 104.3* 101.0* 102.8*  PLT 145* 166 177 205      HbA1C: Hgb A1c MFr Bld  Date/Time Value Ref Range Status  10/02/2022 11:16 PM 6.3 (H) 4.8 - 5.6 % Final    Comment:    (NOTE) Pre diabetes:          5.7%-6.4%  Diabetes:              >6.4%  Glycemic control for   <7.0% adults with diabetes     Urinalysis: No results for input(s): "COLORURINE", "LABSPEC", "PHURINE", "GLUCOSEU", "HGBUR", "BILIRUBINUR", "KETONESUR", "PROTEINUR", "UROBILINOGEN", "NITRITE", "LEUKOCYTESUR" in the last 72 hours.  Invalid input(s): "APPERANCEUR"    Imaging: No results found.   Medications:      ascorbic acid  500 mg Oral BID   bacitracin   Topical BID   calcitRIOL  0.25 mcg Oral Daily   feeding supplement  237 mL Oral TID BM   gabapentin  100 mg Oral BID   heparin injection (  subcutaneous)  5,000 Units Subcutaneous Q12H   iron polysaccharides  150 mg Oral Daily   midodrine  10 mg Oral TID WC   multivitamin  1 tablet Oral QHS   pantoprazole  40 mg Oral BID   sodium zirconium cyclosilicate  10 g Oral TID    Assessment/ Plan:   [name] is a 77 y.o. black male with chronic diastolic congestive heart failure, hypertension, COPD, hyperlipidemia, who is admitted to [facility] on 02/22/2023 for Acute pulmonary edema [J81.0] Acute on chronic diastolic CHF (congestive heart failure) [I50.33] Acute on chronic respiratory failure with hypoxia [J96.21] Acute on chronic congestive heart failure, unspecified heart failure type [I50.9]   #1: Acute kidney injury with hyperkalemia on Chronic kidney disease stage IIIB: creatinine baseline 2.19, GFR of 31. Concern for recurrent injury. Possibly from IV furosemide given on 3/27 and 3/28? However now with hyperkalemia. Low threshold to reinitiate dialysis.  - Continue to hold furosemide - Agree with potassium binder - No  indication for dialysis.    #2: Acute respiratory failure secondary to acute exacerbation of chronic diastolic congestive heart failure.  - low salt diet - holding diuretics.    #3: Hypotension: 95/76 - Continue midodrine 10 mg 3 times daily.  #4: Anemia with chronic kidney disease and acute blood loss with recent GI bleed. Hemoglobin is stable, 9.7. Macrocytic.    LOS: [facility] 3/29/20241:46 PM

## 2023-03-14 NOTE — TOC Progression Note (Addendum)
Transition of Care [facility]) - Progression Note    Patient Details  Name: John [name] MRN: [id number] Date of Birth: Jun 17, 1946  [facility] CM/SW Contact  Laurena Slimmer, RN Phone Number: 03/14/2023, 9:47 AM  Clinical Narrative:    Retrieved message from [place] regarding patient's auth.  Call placed to [facility] @ [phone]. Per represenative Olin Hauser patient's Josem Kaufmann will expired by 3/30 at midnight. If patient does not admit to facility he will require a new authorization. Patient's facility authorization update from [facility] to [facility].  MD notified, discharge unlikely before next week.    Expected Discharge Plan: [place] Barriers to Discharge: Continued Medical Work up  Expected Discharge Plan and Services       Living arrangements for the past 2 months: Single Family Home                                       Social Determinants of Health (SDOH) Interventions SDOH Screenings   Food Insecurity: No Food Insecurity (01/29/2023)  Housing: Low Risk  (01/29/2023)  Transportation Needs: No Transportation Needs (01/29/2023)  Utilities: Not At Risk (01/29/2023)  Alcohol Screen: Low Risk  (07/17/2022)  Depression (PHQ2-9): Low Risk  (07/30/2022)  Financial Resource Strain: Low Risk  (07/17/2022)  Physical Activity: Sufficiently Active (07/17/2022)  Social Connections: Socially Integrated (07/17/2022)  Stress: No Stress Concern Present (07/17/2022)  Tobacco Use: Medium Risk (03/06/2023)    Readmission Risk Interventions    01/30/2023   11:09 AM  Readmission Risk Prevention Plan  Transportation Screening Complete  Medication Review ([place]) Complete  PCP or Specialist appointment within 3-5 days of discharge Complete  HRI or [place] Complete  SW Recovery Care/Counseling Consult Complete  [place] Not Applicable

## 2023-03-14 NOTE — Progress Notes (Signed)
Occupational Therapy Treatment Patient Details Name: John [name] MRN: [id number] DOB: 07-25-1946 Today's Date: 03/14/2023   History of present illness Pt is a 77 y/o M admitted on 02/18/2023 after presenting with c/c of SOB. Pt was found to have O2 desaturation to 70-80s on home 3L O2. Pt also with painful wound on posterior LLE. Pt is being treated for acute on chronic diastolic CHF. Pt with recent admission 2/14-3/19 due to hypovolemic & cardiogenic shock in the setting of duodenal ulcer bleeding. PMH: HTN, HLD, COPD 3L O2, diastolic CHF, CKD stage IV, duodenal ulcer with bleeding   OT comments  Pt with flat affect but agreeable to participation in rehab session. Requires Min A for supine<sit transition, while having to take an extended rest break during the process. Pt's O2 sats drop into mid 60s. RN notified and requests increase in O2 from 3L to 6L. O2 sats increase to mid 70s. Pt requires Mod A for standing and transferring to recliner. Had BM in bed, requires Max A for pericare. O2 sats eventually increase only to upper 70s. RN again notified. Pt demonstrates b/l UE and LE tremors, which he states are new, although RN states pt has been having these tremors for some time. Pt left in room, seated in recliner, with food tray and with RN and NT in room.    Recommendations for follow up therapy are one component of a multi-disciplinary discharge planning process, led by the attending physician.  Recommendations may be updated based on patient status, additional functional criteria and insurance authorization.    Assistance Recommended at Discharge Frequent or constant Supervision/Assistance  Patient can return home with the following  A lot of help with bathing/dressing/bathroom;A lot of help with walking and/or transfers;Assistance with cooking/housework;Assist for transportation;Help with stairs or ramp for entrance;Direct supervision/assist for financial management;Direct supervision/assist for  medications management   Equipment Recommendations       Recommendations for Other Services      Precautions / Restrictions Precautions Precautions: Fall Restrictions Weight Bearing Restrictions: No Other Position/Activity Restrictions: Tunneled hemodialysis catheter via the right internal jugular vein placed 2/26       Mobility Bed Mobility Overal bed mobility: Needs Assistance Bed Mobility: Supine to Sit     Supine to sit: Min assist     General bed mobility comments: Requires Min A for repositioning b/l LE + 1 extended rest break during transition from supine<sit    Transfers Overall transfer level: Needs assistance Equipment used: Rolling walker (2 wheels) Transfers: Sit to/from Stand, Bed to chair/wheelchair/BSC Sit to Stand: Mod assist     Step pivot transfers: Mod assist     General transfer comment: Mod A + very close SUPV for safety, as pt feels unstable in standing, w/ trembling in LE     Balance Overall balance assessment: Needs assistance Sitting-balance support: Feet supported Sitting balance-Leahy Scale: Good     Standing balance support: Bilateral upper extremity supported, During functional activity, Reliant on assistive device for balance Standing balance-Leahy Scale: Poor Standing balance comment: limited standing tolerance 2/2 weakness                           ADL either performed or assessed with clinical judgement   ADL   Eating/Feeding: Sitting;Set up;Supervision/ safety                           Toileting- Clothing Manipulation and Hygiene: Sit to/from  stand;Maximal assistance Toileting - Clothing Manipulation Details (indicate cue type and reason): following BM in bed            Extremity/Trunk Assessment Upper Extremity Assessment Upper Extremity Assessment: Generalized weakness   Lower Extremity Assessment Lower Extremity Assessment: Generalized weakness        Vision       Perception      Praxis      Cognition Arousal/Alertness: Lethargic Behavior During Therapy: Flat affect, WFL for tasks assessed/performed Overall Cognitive Status: No family/caregiver present to determine baseline cognitive functioning                                 General Comments: follows commands with extra time, delayed processing, requires encouragement to participate        Exercises Total Joint Exercises Ankle Circles/Pumps: Strengthening, Both, 10 reps Quad Sets: Strengthening, Both, 10 reps Other Exercises Other Exercises: sitting and standing balance tolerance. Educ re: breathing techniques, ECS    Shoulder Instructions       General Comments      Pertinent Vitals/ Pain       Pain Assessment Pain Assessment: No/denies pain  Home Living                                          Prior Functioning/Environment              Frequency  Min 2X/week        Progress Toward Goals  OT Goals(current goals can now be found in the care plan section)  Progress towards OT goals: Progressing toward goals  Acute Rehab OT Goals OT Goal Formulation: With patient Time For Goal Achievement: 03/24/23 Potential to Achieve Goals: [place] Discharge plan remains appropriate;Frequency remains appropriate    Co-evaluation                 AM-PAC OT "6 Clicks" Daily Activity     Outcome Measure   Help from another person eating meals?: A Little Help from another person taking care of personal grooming?: A Little Help from another person toileting, which includes using toliet, bedpan, or urinal?: A Lot Help from another person bathing (including washing, rinsing, drying)?: A Lot Help from another person to put on and taking off regular upper body clothing?: A Little Help from another person to put on and taking off regular lower body clothing?: A Lot 6 Click Score: 15    End of Session Equipment Utilized During Treatment: Rolling walker (2  wheels)  OT Visit Diagnosis: Unsteadiness on feet (R26.81);Muscle weakness (generalized) (M62.81);Pain   Activity Tolerance Patient tolerated treatment well;Patient limited by fatigue;Other (comment) (Treatment limited 2/2 SOB)   Patient Left in chair;with call bell/phone within reach;with nursing/sitter in room   Nurse Communication Mobility status        Time: [id number] OT Time Calculation (min): 33 min  Charges: OT General Charges $OT Visit: 1 Visit OT Treatments $Self Care/Home Management : 23-37 mins  Josiah Lobo, PhD, MS, OTR/L 03/14/23, 10:53 AM

## 2023-03-14 NOTE — Progress Notes (Signed)
PT Cancellation Note  Patient Details Name: Terriance Pavese MRN: [id number] DOB: February 03, 1946   Cancelled Treatment:     Therapist in just now to see pt with breakfast tray still in front. Pt refused PT stating he has been interrupted all morning and would like to eat. Therapist exited room, pt began watching tv again and did not attempt eating. Continue PT next available date/time per POC.   [name] 03/14/2023, 12:44 PM

## 2023-03-14 NOTE — Progress Notes (Signed)
[facility] Cardiology    SUBJECTIVE: Not very communicative alert does not respond to very many questions working with occupational therapy currently to help with increase activity.  Denies any pain or worsening shortness of breath   Vitals:   03/13/23 1947 03/14/23 0111 03/14/23 0505 03/14/23 0752  BP: 109/76 112/71 96/74 95/76   Pulse: (!) 107 82 77 86  Resp: 17 18 20 16   Temp: 98.3 F (36.8 C) 97.8 F (36.6 C) 97.9 F (36.6 C) 98.7 F (37.1 C)  TempSrc:  Oral Oral   SpO2: 95% 97% 91%   Weight:      Height:         Intake/Output Summary (Last 24 hours) at 03/14/2023 0813 Last data filed at 03/13/2023 1923 Gross per 24 hour  Intake 340 ml  Output 200 ml  Net 140 ml      PHYSICAL EXAM  General: Well developed, well nourished, in no acute distress HEENT:  Normocephalic and atramatic Neck:  No JVD.  Lungs: Clear bilaterally to auscultation and percussion. Heart: HRRR . Normal S1 and S2 without gallops or murmurs.  Abdomen: Bowel sounds are positive, abdomen soft and non-tender  Msk:  Back normal, normal gait. Normal strength and tone for age. Extremities: No clubbing, cyanosis or edema.   Neuro: Alert and oriented X 3. Psych:  Good affect, responds appropriately   LABS: Basic Metabolic Panel: Recent Labs    03/12/23 1004 03/13/23 0843 03/14/23 0631  NA  --  133* 132*  K  --  5.3* 6.2*  CL  --  94* 94*  CO2  --  28 29  GLUCOSE  --  133* 120*  BUN  --  63* 72*  CREATININE  --  3.29* 3.96*  CALCIUM  --  9.4 9.4  MG  --  2.0 2.3  PHOS 3.4  --   --    Liver Function Tests: No results for input(s): "AST", "ALT", "ALKPHOS", "BILITOT", "PROT", "ALBUMIN" in the last 72 hours. No results for input(s): "LIPASE", "AMYLASE" in the last 72 hours. CBC: Recent Labs    03/13/23 0843 03/14/23 0631  WBC 8.7 10.6*  HGB 9.2* 9.7*  HCT 30.5* 32.7*  MCV 101.0* 102.8*  PLT 177 205   Cardiac Enzymes: No results for input(s): "CKTOTAL", "CKMB", "CKMBINDEX", "TROPONINI" in  the last 72 hours. BNP: Invalid input(s): "POCBNP" D-Dimer: No results for input(s): "DDIMER" in the last 72 hours. Hemoglobin A1C: No results for input(s): "HGBA1C" in the last 72 hours. Fasting Lipid Panel: No results for input(s): "CHOL", "HDL", "LDLCALC", "TRIG", "CHOLHDL", "LDLDIRECT" in the last 72 hours. Thyroid Function Tests: No results for input(s): "TSH", "T4TOTAL", "T3FREE", "THYROIDAB" in the last 72 hours.  Invalid input(s): "FREET3" Anemia Panel: No results for input(s): "VITAMINB12", "FOLATE", "FERRITIN", "TIBC", "IRON", "RETICCTPCT" in the last 72 hours.  No results found.   Echo borderline overall left ventricular function EF around 50 to 55% with increased right-sided heart pressures RSVP of 63  TELEMETRY: Normal sinus rhythm rate around 90 nonspecific findings:  ASSESSMENT AND PLAN:  Principal Problem:   Acute on chronic diastolic CHF (congestive heart failure) (HCC) Active Problems:   COPD (chronic obstructive pulmonary disease) (HCC)   Hypertension   CKD (chronic kidney disease), stage IV (HCC)   Macrocytic anemia   Duodenal ulcer   Open wound_in posterior left lower leg    Plan Acute on chronic diastolic congestive heart failure with right-sided heart failure continue current therapy with blood pressure management volume management with diuretics as  necessary Continue low-dose intermittent Lasix to help with heart failure being held for now because of hypotension and renal insufficiency Continue to monitor renal function GFR worsened on diuretics with discontinuing them for now and treat conservatively for now Agree with PT OT for increase activity Recommend conservative cardiac input at this stage  Yolonda Kida, MD 03/14/2023 8:13 AM

## 2023-03-14 NOTE — Progress Notes (Signed)
PROGRESS NOTE    John [name]  [id number] DOB: [birth year] DOA: 03/03/2023 PCP: Alisa Graff, FNP   Brief Narrative:   77 y.o. male with medical history significant of hypertension, hyperlipidemia, COPD on 3 L oxygen, diastolic CHF CKD stage IV, duodenal ulcer with bleeding, recent admission due to hypovolemic and cardiogenic shock, who presents with shortness of breath.  Patient was recently hospitalized from 2/14 - 3/19 for hypovolemic/cardiogenic shock in the setting of duodenal bleeding ulcer.  Upon discharge his losartan, metolazone and torsemide were held.  Now coming in with CHF exacerbation.  During this admission he has been cautiously diuresed but due to soft blood pressure this is off-and-on been held.  He was also started on midodrine.  Nephrology team has been following the patient due to CKD stage IV but no indication for hemodialysis yet.  Due to soft blood pressure and elevated creatinine, diuresis cautiously   Assessment & Plan:  Principal Problem:   Acute on chronic diastolic CHF (congestive heart failure) (HCC) Active Problems:   CKD (chronic kidney disease), stage IV (HCC)   Hypertension   COPD (chronic obstructive pulmonary disease) (HCC)   Macrocytic anemia   Duodenal ulcer   Open wound_in posterior left lower leg    Acute on chronic diastolic CHF (congestive heart failure) ([place]); Class III : 2D echo on 01/30/2023 showed EF of 50-55% with grade 1 diastolic dysfunction.  Due to recent hypovolemic shock, patient's diuretics were on hold.  He developed bilateral leg edema, shortness breath, positive JVD, elevated BNP 2659, clinically consistent with CHF exacerbation. [facility] cardiology following.  On midodrine due to soft blood pressure.  Diuretics on hold due to rising creatinine.   Acute kidney injury CKD (chronic kidney disease), stage IV (HCC) Hyperkalemia Baseline creatinine around 2.1.  Creatinine continues to rise, today 3.96 -EKG, Lokelma, DuoNeb,  insulin/D50 ordered. Bladder scan ~300cc. Will place foley to avoid any further retention, worsening of his Cr and accurate I/Os   Hypertension:  -IV hydralazine as needed but due to hypotension he is on midodrine.   COPD (chronic obstructive pulmonary disease) (HCC) -Bronchodilators   Macrocytic anemia:  Hemoglobin 89.7 (8.9 03/04/2023 at the discharge)   Duodenal ulcer -continue protonix 40 mg bid   Open wound_in posterior left lower leg: Seen by wound care team.  No obvious evidence of infection.  Wound care recommendations:Topical treatment orders provided for bedside nurses to perform as follows to absorb drainage and provide antimicrobial benefits: Apply Aquacel Kellie Simmering # [phone]) to left leg wound Q day, then cover with ABD pad and kerlex.    Pallaitive care input noted--pt is wanting to be FULL code. Overall poor prognosis  Per TOC pt has bed offer at [facility] communication : Consults :nephrology, [facility] Cardiology CODE STATUS: Full DVT Prophylaxis :SCD  Status is: Inpatient Remains inpatient appropriate because: CHF   Overall long term poor prognosis.  Ongoing management of his volume status, complicated by soft blood pressure pain renal function    Subjective: Seen and examined at bedside.  Tells me he still feels the same, no better no worse. Somewhat remains argumentative Examination: Constitutional: Not in acute distress, poor dentition Respiratory: Bibasilar crackles Cardiovascular: Normal sinus rhythm, no rubs Abdomen: Nontender nondistended good bowel sounds Musculoskeletal: No edema noted Skin: Chronic ulcer of his lower extremity Neurologic: CN 2-12 grossly intact.  And nonfocal Psychiatric: Normal judgment and insight. Alert and oriented x 3. Normal mood.  Objective: Vitals:   03/13/23 1947 03/14/23 0111 03/14/23  0505 03/14/23 0752  BP: 109/76 112/71 96/74 95/76   Pulse: (!) 107 82 77 86  Resp: 17 18 20 16   Temp: 98.3 F (36.8 C) 97.8 F  (36.6 C) 97.9 F (36.6 C) 98.7 F (37.1 C)  TempSrc:  Oral Oral   SpO2: 95% 97% 91%   Weight:      Height:        Intake/Output Summary (Last 24 hours) at 03/14/2023 0804 Last data filed at 03/13/2023 1923 Gross per 24 hour  Intake 340 ml  Output 200 ml  Net 140 ml   Filed Weights   03/11/23 0912 03/12/23 0735 03/13/23 1133  Weight: 84.5 kg 86.2 kg 85.5 kg     Data Reviewed:   CBC: Recent Labs  Lab 03/08/23 0535 03/12/23 1218 03/13/23 0843 03/14/23 0631  WBC 6.7 9.4 8.7 10.6*  HGB 8.2* 9.3* 9.2* 9.7*  HCT 26.8* 31.6* 30.5* 32.7*  MCV 99.6 104.3* 101.0* 102.8*  PLT 145* 166 177 [id number]   Basic Metabolic Panel: Recent Labs  Lab 03/08/23 0535 03/09/23 0943 03/11/23 0850 03/12/23 1004 03/13/23 0843 03/14/23 0631  NA 135 134* 136  --  133* 132*  K 4.0 4.1 4.4  --  5.3* 6.2*  CL 98 95* 98  --  94* 94*  CO2 30 30 33*  --  28 29  GLUCOSE 117* 108* 118*  --  133* 120*  BUN 42* 44* 47*  --  63* 72*  CREATININE 2.19* 2.46* 2.39*  --  3.29* 3.96*  CALCIUM 8.6* 8.9 8.7*  --  9.4 9.4  MG  --   --   --   --  2.0 2.3  PHOS 3.5  --   --  3.4  --   --    GFR: Estimated Creatinine Clearance: 16.6 mL/min (A) (by C-G formula based on SCr of 3.96 mg/dL (H)). Liver Function Tests: Recent Labs  Lab 03/08/23 0535  ALBUMIN 2.5*   No results for input(s): "LIPASE", "AMYLASE" in the last 168 hours. No results for input(s): "AMMONIA" in the last 168 hours. Coagulation Profile: No results for input(s): "INR", "PROTIME" in the last 168 hours. Cardiac Enzymes: No results for input(s): "CKTOTAL", "CKMB", "CKMBINDEX", "TROPONINI" in the last 168 hours. BNP (last 3 results) No results for input(s): "PROBNP" in the last 8760 hours. HbA1C: No results for input(s): "HGBA1C" in the last 72 hours. CBG: No results for input(s): "GLUCAP" in the last 168 hours. Lipid Profile: No results for input(s): "CHOL", "HDL", "LDLCALC", "TRIG", "CHOLHDL", "LDLDIRECT" in the last 72 hours. Thyroid  Function Tests: No results for input(s): "TSH", "T4TOTAL", "FREET4", "T3FREE", "THYROIDAB" in the last 72 hours. Anemia Panel: No results for input(s): "VITAMINB12", "FOLATE", "FERRITIN", "TIBC", "IRON", "RETICCTPCT" in the last 72 hours. Sepsis Labs: No results for input(s): "PROCALCITON", "LATICACIDVEN" in the last 168 hours.  No results found for this or any previous visit (from the past 240 hour(s)).       Radiology Studies: No results found.      Scheduled Meds:  albuterol  10 mg Nebulization Once   ascorbic acid  500 mg Oral BID   bacitracin   Topical BID   calcitRIOL  0.25 mcg Oral Daily   insulin aspart  5 Units Intravenous NOW   And   dextrose  1 ampule Intravenous Once   feeding supplement  237 mL Oral TID BM   gabapentin  100 mg Oral BID   heparin injection (subcutaneous)  5,000 Units Subcutaneous Q12H   iron  polysaccharides  150 mg Oral Daily   midodrine  10 mg Oral TID WC   multivitamin  1 tablet Oral QHS   pantoprazole  40 mg Oral BID   sodium zirconium cyclosilicate  10 g Oral TID   Continuous Infusions:   LOS: 9 days   Time spent= 35 mins    Casia Corti Arsenio Loader, MD [facility]  If 7PM-7AM, please contact night-coverage  03/14/2023, 8:04 AM

## 2023-03-15 DIAGNOSIS — I5033 Acute on chronic diastolic (congestive) heart failure: Secondary | ICD-10-CM | POA: Diagnosis not present

## 2023-03-15 LAB — CBC
HCT: 32.6 % — ABNORMAL LOW (ref 39.0–52.0)
Hemoglobin: 9.8 g/dL — ABNORMAL LOW (ref 13.0–17.0)
MCH: 30.6 pg (ref 26.0–34.0)
MCHC: 30.1 g/dL (ref 30.0–36.0)
MCV: 101.9 fL — ABNORMAL HIGH (ref 80.0–100.0)
Platelets: 214 10*3/uL (ref 150–400)
RBC: 3.2 MIL/uL — ABNORMAL LOW (ref 4.22–5.81)
RDW: 19.8 % — ABNORMAL HIGH (ref 11.5–15.5)
WBC: 11.9 10*3/uL — ABNORMAL HIGH (ref 4.0–10.5)
nRBC: 0.3 % — ABNORMAL HIGH (ref 0.0–0.2)

## 2023-03-15 LAB — BASIC METABOLIC PANEL
Anion gap: 11 (ref 5–15)
BUN: 71 mg/dL — ABNORMAL HIGH (ref 8–23)
CO2: 30 mmol/L (ref 22–32)
Calcium: 9.4 mg/dL (ref 8.9–10.3)
Chloride: 92 mmol/L — ABNORMAL LOW (ref 98–111)
Creatinine, Ser: 3.89 mg/dL — ABNORMAL HIGH (ref 0.61–1.24)
GFR, Estimated: 15 mL/min — ABNORMAL LOW (ref >60)
Glucose, Bld: 119 mg/dL — ABNORMAL HIGH (ref 70–99)
Potassium: 5.5 mmol/L — ABNORMAL HIGH (ref 3.5–5.1)
Sodium: 133 mmol/L — ABNORMAL LOW (ref 135–145)

## 2023-03-15 LAB — MAGNESIUM: Magnesium: 2.2 mg/dL (ref 1.7–2.4)

## 2023-03-15 MED ORDER — CHLORHEXIDINE GLUCONATE CLOTH 2 % EX PADS
6.0000 | MEDICATED_PAD | Freq: Every day | CUTANEOUS | Status: DC
  Administered 2023-03-15 – 2023-03-16 (×2): 6 via TOPICAL

## 2023-03-15 NOTE — Progress Notes (Signed)
[name]  PROGRESS NOTE   Subjective:   No complaints. Seated in chair. Denies any shortness of breath or peripheral edema.   Potassium 5.5 (6.2)  Creatiine 3.89 (3.96)  Changed to a renal diet.   Objective:  Vital signs: Blood pressure 93/69, pulse (!) 107, temperature 97.8 F (36.6 C), temperature source Axillary, resp. rate 20, height 5\' 11"  (1.803 m), weight 85.5 kg, SpO2 96 %.  Intake/Output Summary (Last 24 hours) at 03/15/2023 1030 Last data filed at 03/14/2023 1925 Gross per 24 hour  Intake --  Output 800 ml  Net -800 ml    Filed Weights   03/11/23 0912 03/12/23 0735 03/13/23 1133  Weight: 84.5 kg 86.2 kg 85.5 kg     Physical Exam: General:  No acute distress, sitting in chair  Head:  Normocephalic, atraumatic. Moist oral mucosal membranes  Eyes:  Anicteric  Lungs:   Diminished bilaterally  Heart:  Regular   Abdomen:   Soft, nontender, bowel sounds present  Extremities: + peripheral edema.  Neurologic:  Awake, alert, following commands  Skin:  Left lower extremity wound - clean and healing  Access: None    Basic Metabolic Panel: Recent Labs  Lab 03/09/23 0943 03/11/23 0850 03/12/23 1004 03/13/23 0843 03/14/23 0631 03/15/23 0255  NA 134* 136  --  133* 132* 133*  K 4.1 4.4  --  5.3* 6.2* 5.5*  CL 95* 98  --  94* 94* 92*  CO2 30 33*  --  28 29 30   GLUCOSE 108* 118*  --  133* 120* 119*  BUN 44* 47*  --  63* 72* 71*  CREATININE 2.46* 2.39*  --  3.29* 3.96* 3.89*  CALCIUM 8.9 8.7*  --  9.4 9.4 9.4  MG  --   --   --  2.0 2.3 2.2  PHOS  --   --  3.4  --   --   --     GFR: Estimated Creatinine Clearance: 16.9 mL/min (A) (by C-G formula based on SCr of 3.89 mg/dL (H)).  Liver Function Tests: No results for input(s): "AST", "ALT", "ALKPHOS", "BILITOT", "PROT", "ALBUMIN" in the last 168 hours.  No results for input(s): "LIPASE", "AMYLASE" in the last 168 hours. Recent Labs  Lab 03/14/23 1039  AMMONIA 25     CBC: Recent Labs   Lab 03/12/23 1218 03/13/23 0843 03/14/23 0631 03/15/23 0255  WBC 9.4 8.7 10.6* 11.9*  HGB 9.3* 9.2* 9.7* 9.8*  HCT 31.6* 30.5* 32.7* 32.6*  MCV 104.3* 101.0* 102.8* 101.9*  PLT 166 177 205 214      HbA1C: Hgb A1c MFr Bld  Date/Time Value Ref Range Status  10/02/2022 11:16 PM 6.3 (H) 4.8 - 5.6 % Final    Comment:    (NOTE) Pre diabetes:          5.7%-6.4%  Diabetes:              >6.4%  Glycemic control for   <7.0% adults with diabetes     Urinalysis: No results for input(s): "COLORURINE", "LABSPEC", "PHURINE", "GLUCOSEU", "HGBUR", "BILIRUBINUR", "KETONESUR", "PROTEINUR", "UROBILINOGEN", "NITRITE", "LEUKOCYTESUR" in the last 72 hours.  Invalid input(s): "APPERANCEUR"    Imaging: No results found.   Medications:      ascorbic acid  500 mg Oral BID   bacitracin   Topical BID   calcitRIOL  0.25 mcg Oral Daily   Chlorhexidine Gluconate Cloth  6 each Topical Daily   feeding supplement  237 mL Oral TID BM  gabapentin  100 mg Oral BID   heparin injection (subcutaneous)  5,000 Units Subcutaneous Q12H   iron polysaccharides  150 mg Oral Daily   midodrine  10 mg Oral TID WC   multivitamin  1 tablet Oral QHS   pantoprazole  40 mg Oral BID   sodium zirconium cyclosilicate  10 g Oral BID    Assessment/ Plan:   [name] is a 77 y.o. black male with chronic diastolic congestive heart failure, hypertension, COPD, hyperlipidemia, who is admitted to [facility] on 02/18/2023 for Acute pulmonary edema [J81.0] Acute on chronic diastolic CHF (congestive heart failure) [I50.33] Acute on chronic respiratory failure with hypoxia [J96.21] Acute on chronic congestive heart failure, unspecified heart failure type [I50.9]   #1: Acute kidney injury with hyperkalemia on Chronic kidney disease stage IIIB: creatinine baseline 2.19, GFR of 31. Concern for recurrent injury. Possibly from IV furosemide given on 3/27 and 3/28? However now with hyperkalemia. Low threshold to reinitiate  dialysis.  - Continue to hold furosemide - Agree with potassium binder - No indication for dialysis.  - Low potassium diet.    #2: Acute respiratory failure secondary to acute exacerbation of chronic diastolic congestive heart failure.  - low salt diet - holding diuretics.    #3: Hypotension: 93/69 - Continue midodrine 10 mg 3 times daily.  #4: Anemia with chronic kidney disease and acute blood loss with recent GI bleed. Hemoglobin is stable, 9.8. Macrocytic.    LOS: [facility] 3/30/202410:30 AM

## 2023-03-15 NOTE — Progress Notes (Signed)
Patient with minimal interaction with staff, not very talkative, refused his afternoon midodrine.  Encouraged pt to get OOB, sit up in the chair, he declined when offered.  Reviewed with pt that he is on a fluid rx diet at this time.  Continues on 5L [place], will wean 02 as able.  Bed alarm on for safety.  Call bell within reach.  Visitor at the bedside.  WCTM

## 2023-03-15 NOTE — Progress Notes (Signed)
PROGRESS NOTE    John [name]  [id number] DOB: [birth year] DOA: 03/15/2023 PCP: Alisa Graff, FNP   Brief Narrative:   77 y.o. male with medical history significant of hypertension, hyperlipidemia, COPD on 3 L oxygen, diastolic CHF CKD stage IV, duodenal ulcer with bleeding, recent admission due to hypovolemic and cardiogenic shock, who presents with shortness of breath.  Patient was recently hospitalized from 2/14 - 3/19 for hypovolemic/cardiogenic shock in the setting of duodenal bleeding ulcer.  Upon discharge his losartan, metolazone and torsemide were held.  Now coming in with CHF exacerbation.  During this admission he has been cautiously diuresed but due to soft blood pressure this is off-and-on been held.  He was also started on midodrine.  Nephrology team has been following the patient due to CKD stage IV but no indication for hemodialysis yet.  Due to soft blood pressure and elevated creatinine, diuresis cautiously   Assessment & Plan:  Principal Problem:   Acute on chronic diastolic CHF (congestive heart failure) (HCC) Active Problems:   CKD (chronic kidney disease), stage IV (HCC)   Hypertension   COPD (chronic obstructive pulmonary disease) (HCC)   Macrocytic anemia   Duodenal ulcer   Open wound_in posterior left lower leg    Acute on chronic diastolic CHF (congestive heart failure) ([place]); Class III : 2D echo on 01/30/2023 showed EF of 50-55% with grade 1 diastolic dysfunction.  Due to recent hypovolemic shock, patient's diuretics were on hold.  He developed bilateral leg edema, shortness breath, positive JVD, elevated BNP 2659, clinically consistent with CHF exacerbation. [facility] cardiology following.  On midodrine due to soft blood pressure.  Diuretics prn.    Acute kidney injury CKD (chronic kidney disease), stage IV (HCC) Hyperkalemia Baseline creatinine around 2.1.  Creatinine continues to rise, Cr 3.96 > 3.89 -Will repeat dose of Lokelma. K slightly  better Bladder scan ~300cc. Foley placed temporarily on 3/29, worsening of his Cr and accurate I/Os   Hypertension:  -IV hydralazine as needed but due to hypotension he is on midodrine.   COPD (chronic obstructive pulmonary disease) (HCC) -Bronchodilators   Macrocytic anemia:  Hb stable.    Duodenal ulcer -continue protonix 40 mg bid   Open wound_in posterior left lower leg: Seen by wound care team.  No obvious evidence of infection.  Wound care recommendations:Topical treatment orders provided for bedside nurses to perform as follows to absorb drainage and provide antimicrobial benefits: Apply Aquacel Kellie Simmering # [phone]) to left leg wound Q day, then cover with ABD pad and kerlex.    Pallaitive care input noted--pt is wanting to be FULL code. Overall poor prognosis  Per TOC pt has bed offer at [facility] communication : Consults :nephrology, [facility] CODE STATUS: Full DVT Prophylaxis :SCD  Status is: Inpatient Remains inpatient appropriate because: CHF   Overall long term poor prognosis.  Ongoing management of his volume status, complicated by soft blood pressure pain renal function    Subjective: Sitting up in the bed, denies sob.  Examination: Constitutional: Not in acute distress, cachecitc.  Respiratory: bibasilar crackles.  Cardiovascular: Normal sinus rhythm, no rubs Abdomen: Nontender nondistended good bowel sounds Musculoskeletal: No edema noted Skin: No rashes seen Neurologic: CN 2-12 grossly intact.  And nonfocal Psychiatric: Normal judgment and insight. Alert and oriented x 3. Normal mood.   Foley in place.   Objective: Vitals:   03/14/23 2345 03/15/23 0522 03/15/23 0749 03/15/23 0812  BP: 108/69 105/76 93/69   Pulse: 73 87 (!)  51 (!) 107  Resp: 18 20 20 20   Temp: 97.8 F (36.6 C) 97.9 F (36.6 C) 97.8 F (36.6 C)   TempSrc: Oral  Axillary   SpO2: 97% 99% 96%   Weight:      Height:        Intake/Output Summary (Last 24 hours) at  03/15/2023 0830 Last data filed at 03/14/2023 1925 Gross per 24 hour  Intake --  Output 800 ml  Net -800 ml   Filed Weights   03/11/23 0912 03/12/23 0735 03/13/23 1133  Weight: 84.5 kg 86.2 kg 85.5 kg     Data Reviewed:   CBC: Recent Labs  Lab 03/12/23 1218 03/13/23 0843 03/14/23 0631 03/15/23 0255  WBC 9.4 8.7 10.6* 11.9*  HGB 9.3* 9.2* 9.7* 9.8*  HCT 31.6* 30.5* 32.7* 32.6*  MCV 104.3* 101.0* 102.8* 101.9*  PLT 166 177 205 [id number]   Basic Metabolic Panel: Recent Labs  Lab 03/09/23 0943 03/11/23 0850 03/12/23 1004 03/13/23 0843 03/14/23 0631 03/15/23 0255  NA 134* 136  --  133* 132* 133*  K 4.1 4.4  --  5.3* 6.2* 5.5*  CL 95* 98  --  94* 94* 92*  CO2 30 33*  --  28 29 30   GLUCOSE 108* 118*  --  133* 120* 119*  BUN 44* 47*  --  63* 72* 71*  CREATININE 2.46* 2.39*  --  3.29* 3.96* 3.89*  CALCIUM 8.9 8.7*  --  9.4 9.4 9.4  MG  --   --   --  2.0 2.3 2.2  PHOS  --   --  3.4  --   --   --    GFR: Estimated Creatinine Clearance: 16.9 mL/min (A) (by C-G formula based on SCr of 3.89 mg/dL (H)). Liver Function Tests: No results for input(s): "AST", "ALT", "ALKPHOS", "BILITOT", "PROT", "ALBUMIN" in the last 168 hours.  No results for input(s): "LIPASE", "AMYLASE" in the last 168 hours. Recent Labs  Lab 03/14/23 1039  AMMONIA 25   Coagulation Profile: No results for input(s): "INR", "PROTIME" in the last 168 hours. Cardiac Enzymes: No results for input(s): "CKTOTAL", "CKMB", "CKMBINDEX", "TROPONINI" in the last 168 hours. BNP (last 3 results) No results for input(s): "PROBNP" in the last 8760 hours. HbA1C: No results for input(s): "HGBA1C" in the last 72 hours. CBG: No results for input(s): "GLUCAP" in the last 168 hours. Lipid Profile: No results for input(s): "CHOL", "HDL", "LDLCALC", "TRIG", "CHOLHDL", "LDLDIRECT" in the last 72 hours. Thyroid Function Tests: No results for input(s): "TSH", "T4TOTAL", "FREET4", "T3FREE", "THYROIDAB" in the last 72  hours. Anemia Panel: No results for input(s): "VITAMINB12", "FOLATE", "FERRITIN", "TIBC", "IRON", "RETICCTPCT" in the last 72 hours. Sepsis Labs: No results for input(s): "PROCALCITON", "LATICACIDVEN" in the last 168 hours.  No results found for this or any previous visit (from the past 240 hour(s)).       Radiology Studies: No results found.      Scheduled Meds:  ascorbic acid  500 mg Oral BID   bacitracin   Topical BID   calcitRIOL  0.25 mcg Oral Daily   Chlorhexidine Gluconate Cloth  6 each Topical Daily   feeding supplement  237 mL Oral TID BM   gabapentin  100 mg Oral BID   heparin injection (subcutaneous)  5,000 Units Subcutaneous Q12H   iron polysaccharides  150 mg Oral Daily   midodrine  10 mg Oral TID WC   multivitamin  1 tablet Oral QHS   pantoprazole  40 mg  Oral BID   sodium zirconium cyclosilicate  10 g Oral BID   Continuous Infusions:   LOS: 10 days   Time spent= 35 mins    Kylie Gros Arsenio Loader, MD [facility]  If 7PM-7AM, please contact night-coverage  03/15/2023, 8:30 AM

## 2023-03-15 NOTE — Progress Notes (Signed)
[facility] Cardiology    SUBJECTIVE: Patient seems to be doing reasonably well not very talkative up with physical therapy and Occupational Therapy appears to be progressing slowly denies any complaints   Vitals:   03/14/23 2345 03/15/23 0522 03/15/23 0749 03/15/23 0812  BP: 108/69 105/76 93/69   Pulse: 73 87 (!) 51 (!) 107  Resp: 18 20 20 20   Temp: 97.8 F (36.6 C) 97.9 F (36.6 C) 97.8 F (36.6 C)   TempSrc: Oral  Axillary   SpO2: 97% 99% 96%   Weight:      Height:         Intake/Output Summary (Last 24 hours) at 03/15/2023 1123 Last data filed at 03/14/2023 1925 Gross per 24 hour  Intake --  Output 800 ml  Net -800 ml      PHYSICAL EXAM  General: Well developed, well nourished, in no acute distress HEENT:  Normocephalic and atramatic Neck:  No JVD.  Lungs: Clear bilaterally to auscultation and percussion. Heart: HRRR . Normal S1 and S2 without gallops or murmurs.  Abdomen: Bowel sounds are positive, abdomen soft and non-tender  Msk:  Back normal, normal gait. Normal strength and tone for age. Extremities: No clubbing, cyanosis or edema.   Neuro: Alert and oriented X 3. Psych:  Good affect, responds appropriately   LABS: Basic Metabolic Panel: Recent Labs    03/14/23 0631 03/15/23 0255  NA 132* 133*  K 6.2* 5.5*  CL 94* 92*  CO2 29 30  GLUCOSE 120* 119*  BUN 72* 71*  CREATININE 3.96* 3.89*  CALCIUM 9.4 9.4  MG 2.3 2.2   Liver Function Tests: No results for input(s): "AST", "ALT", "ALKPHOS", "BILITOT", "PROT", "ALBUMIN" in the last 72 hours. No results for input(s): "LIPASE", "AMYLASE" in the last 72 hours. CBC: Recent Labs    03/14/23 0631 03/15/23 0255  WBC 10.6* 11.9*  HGB 9.7* 9.8*  HCT 32.7* 32.6*  MCV 102.8* 101.9*  PLT 205 214   Cardiac Enzymes: No results for input(s): "CKTOTAL", "CKMB", "CKMBINDEX", "TROPONINI" in the last 72 hours. BNP: Invalid input(s): "POCBNP" D-Dimer: No results for input(s): "DDIMER" in the last 72  hours. Hemoglobin A1C: No results for input(s): "HGBA1C" in the last 72 hours. Fasting Lipid Panel: No results for input(s): "CHOL", "HDL", "LDLCALC", "TRIG", "CHOLHDL", "LDLDIRECT" in the last 72 hours. Thyroid Function Tests: No results for input(s): "TSH", "T4TOTAL", "T3FREE", "THYROIDAB" in the last 72 hours.  Invalid input(s): "FREET3" Anemia Panel: No results for input(s): "VITAMINB12", "FOLATE", "FERRITIN", "TIBC", "IRON", "RETICCTPCT" in the last 72 hours.  No results found.   Echo showed left ventricular function low normal EF of 50 to 55%  TELEMETRY: Normal sinus rhythm rate of 90 nonspecific ST-T wave changes:  ASSESSMENT AND PLAN:  Principal Problem:   Acute on chronic diastolic CHF (congestive heart failure) (HCC) Active Problems:   COPD (chronic obstructive pulmonary disease) (HCC)   Hypertension   CKD (chronic kidney disease), stage IV (HCC)   Macrocytic anemia   Duodenal ulcer   Open wound_in posterior left lower leg    Plan Acute on chronic diastolic congestive heart failure reasonably compensated continue current therapy Renal insufficiency stage IV continue current management and follow-up with nephrology Altered mental status appears to be persistent continue supportive care COPD by history of mild to moderate continue inhalers as necessary Recommend continue PPI therapy for possible GERD reflux symptoms   Yolonda Kida, MD, 03/15/2023 11:23 AM

## 2023-03-16 ENCOUNTER — Inpatient Hospital Stay: Payer: Medicare HMO

## 2023-03-16 ENCOUNTER — Other Ambulatory Visit: Payer: Self-pay

## 2023-03-16 DIAGNOSIS — I5033 Acute on chronic diastolic (congestive) heart failure: Secondary | ICD-10-CM | POA: Diagnosis not present

## 2023-03-16 LAB — CBC
HCT: 33.5 % — ABNORMAL LOW (ref 39.0–52.0)
Hemoglobin: 10 g/dL — ABNORMAL LOW (ref 13.0–17.0)
MCH: 30.4 pg (ref 26.0–34.0)
MCHC: 29.9 g/dL — ABNORMAL LOW (ref 30.0–36.0)
MCV: 101.8 fL — ABNORMAL HIGH (ref 80.0–100.0)
Platelets: 215 10*3/uL (ref 150–400)
RBC: 3.29 MIL/uL — ABNORMAL LOW (ref 4.22–5.81)
RDW: 19.9 % — ABNORMAL HIGH (ref 11.5–15.5)
WBC: 13.6 10*3/uL — ABNORMAL HIGH (ref 4.0–10.5)
nRBC: 1.5 % — ABNORMAL HIGH (ref 0.0–0.2)

## 2023-03-16 LAB — BASIC METABOLIC PANEL
Anion gap: 19 — ABNORMAL HIGH (ref 5–15)
BUN: 82 mg/dL — ABNORMAL HIGH (ref 8–23)
CO2: 21 mmol/L — ABNORMAL LOW (ref 22–32)
Calcium: 9.6 mg/dL (ref 8.9–10.3)
Chloride: 94 mmol/L — ABNORMAL LOW (ref 98–111)
Creatinine, Ser: 4.76 mg/dL — ABNORMAL HIGH (ref 0.61–1.24)
GFR, Estimated: 12 mL/min — ABNORMAL LOW (ref >60)
Glucose, Bld: 83 mg/dL (ref 70–99)
Potassium: 6.4 mmol/L (ref 3.5–5.1)
Sodium: 134 mmol/L — ABNORMAL LOW (ref 135–145)

## 2023-03-16 LAB — MRSA NEXT GEN BY PCR, NASAL: MRSA by PCR Next Gen: NOT DETECTED

## 2023-03-16 LAB — MAGNESIUM: Magnesium: 2.5 mg/dL — ABNORMAL HIGH (ref 1.7–2.4)

## 2023-03-16 LAB — POTASSIUM: Potassium: 6.4 mmol/L (ref 3.5–5.1)

## 2023-03-16 LAB — GLUCOSE, CAPILLARY: Glucose-Capillary: 111 mg/dL — ABNORMAL HIGH (ref 70–99)

## 2023-03-16 MED ORDER — ETOMIDATE 2 MG/ML IV SOLN: 20.0000 mg | Freq: Once | INTRAVENOUS | Status: AC

## 2023-03-16 MED ORDER — FENTANYL 2500MCG IN NS 250ML (10MCG/ML) PREMIX INFUSION
0.0000 ug/h | INTRAVENOUS | Status: DC
  Administered 2023-03-16: 25 ug/h via INTRAVENOUS
  Filled 2023-03-16: qty 250

## 2023-03-16 MED ORDER — POLYVINYL ALCOHOL 1.4 % OP SOLN: 1.0000 [drp] | Freq: Four times a day (QID) | OPHTHALMIC | Status: DC | PRN

## 2023-03-16 MED ORDER — ORAL CARE MOUTH RINSE: 15.0000 mL | OROMUCOSAL | Status: DC | PRN

## 2023-03-16 MED ORDER — DEXTROSE 50 % IV SOLN: 1.0000 | Freq: Once | INTRAVENOUS | Status: DC

## 2023-03-16 MED ORDER — SODIUM CHLORIDE 0.9 % IV SOLN: INTRAVENOUS | Status: DC | PRN

## 2023-03-16 MED ORDER — ROCURONIUM BROMIDE 10 MG/ML (PF) SYRINGE
PREFILLED_SYRINGE | INTRAVENOUS | Status: AC
  Administered 2023-03-16: 100 mg
  Filled 2023-03-16: qty 10

## 2023-03-16 MED ORDER — GLYCOPYRROLATE 0.2 MG/ML IJ SOLN: 0.2000 mg | INTRAMUSCULAR | Status: DC | PRN

## 2023-03-16 MED ORDER — INSULIN ASPART 100 UNIT/ML IV SOLN: 5.0000 [IU] | Freq: Once | INTRAVENOUS | Status: DC

## 2023-03-16 MED ORDER — ALBUTEROL SULFATE (2.5 MG/3ML) 0.083% IN NEBU
10.0000 mg | INHALATION_SOLUTION | Freq: Once | RESPIRATORY_TRACT | Status: AC
  Administered 2023-03-16: 10 mg via RESPIRATORY_TRACT
  Filled 2023-03-16: qty 12

## 2023-03-16 MED ORDER — DEXTROSE 50 % IV SOLN
1.0000 | Freq: Once | INTRAVENOUS | Status: AC
  Administered 2023-03-16: 50 mL via INTRAVENOUS
  Filled 2023-03-16: qty 50

## 2023-03-16 MED ORDER — INSULIN ASPART 100 UNIT/ML IV SOLN
5.0000 [IU] | Freq: Once | INTRAVENOUS | Status: AC
  Administered 2023-03-16: 5 [IU] via INTRAVENOUS
  Filled 2023-03-16: qty 0.05

## 2023-03-16 MED ORDER — NOREPINEPHRINE 4 MG/250ML-% IV SOLN: 0.0000 ug/min | INTRAVENOUS | Status: DC

## 2023-03-16 MED ORDER — ORAL CARE MOUTH RINSE: 15.0000 mL | OROMUCOSAL | Status: DC

## 2023-03-16 MED ORDER — MIDAZOLAM-SODIUM CHLORIDE 100-0.9 MG/100ML-% IV SOLN: 0.0000 mg/h | INTRAVENOUS | Status: DC

## 2023-03-16 MED ORDER — ETOMIDATE 2 MG/ML IV SOLN
INTRAVENOUS | Status: AC
  Administered 2023-03-16: 20 mg
  Filled 2023-03-16: qty 20

## 2023-03-16 MED ORDER — ACETAMINOPHEN 325 MG PO TABS: 650.0000 mg | ORAL_TABLET | Freq: Four times a day (QID) | ORAL | Status: DC | PRN

## 2023-03-16 MED ORDER — ACETAMINOPHEN 650 MG RE SUPP: 650.0000 mg | Freq: Four times a day (QID) | RECTAL | Status: DC | PRN

## 2023-03-16 MED ORDER — PATIROMER SORBITEX CALCIUM 8.4 G PO PACK
25.2000 g | PACK | Freq: Every day | ORAL | Status: DC
  Filled 2023-03-16: qty 3

## 2023-03-16 MED ORDER — GLYCOPYRROLATE 1 MG PO TABS: 1.0000 mg | ORAL_TABLET | ORAL | Status: DC | PRN

## 2023-03-16 MED ORDER — MORPHINE BOLUS VIA INFUSION: 5.0000 mg | INTRAVENOUS | Status: DC | PRN

## 2023-03-16 MED ORDER — NOREPINEPHRINE 16 MG/250ML-% IV SOLN
0.0000 ug/min | INTRAVENOUS | Status: DC
  Administered 2023-03-16: 12 ug/min via INTRAVENOUS
  Filled 2023-03-16: qty 250

## 2023-03-16 MED ORDER — ROCURONIUM BROMIDE 10 MG/ML (PF) SYRINGE: 100.0000 mg | PREFILLED_SYRINGE | Freq: Once | INTRAVENOUS | Status: AC

## 2023-03-16 MED ORDER — MIDODRINE HCL 5 MG PO TABS: 20.0000 mg | ORAL_TABLET | Freq: Three times a day (TID) | ORAL | Status: DC

## 2023-03-16 MED ORDER — MORPHINE 100MG IN NS 100ML (1MG/ML) PREMIX INFUSION
0.0000 mg/h | INTRAVENOUS | Status: DC
  Administered 2023-03-16: 10 mg/h via INTRAVENOUS
  Filled 2023-03-16: qty 100

## 2023-03-16 MED ORDER — SODIUM CHLORIDE 0.9 % IV SOLN: INTRAVENOUS | Status: DC

## 2023-03-16 MED ORDER — VASOPRESSIN 20 UNITS/100 ML INFUSION FOR SHOCK
0.0000 [IU]/min | INTRAVENOUS | Status: DC
  Administered 2023-03-16: 0.03 [IU]/min via INTRAVENOUS
  Filled 2023-03-16: qty 100

## 2023-03-16 MED ORDER — MIDAZOLAM BOLUS VIA INFUSION (WITHDRAWAL LIFE SUSTAINING TX): 2.0000 mg | INTRAVENOUS | Status: DC | PRN

## 2023-03-16 MED ORDER — FUROSEMIDE 10 MG/ML IJ SOLN
120.0000 mg | Freq: Once | INTRAVENOUS | Status: DC
  Filled 2023-03-16: qty 12

## 2023-03-16 MED ORDER — MIDAZOLAM HCL 2 MG/2ML IJ SOLN: 1.0000 mg | INTRAMUSCULAR | Status: DC | PRN

## 2023-03-17 ENCOUNTER — Telehealth: Payer: Self-pay | Admitting: *Deleted

## 2023-03-17 NOTE — Progress Notes (Signed)
[facility] Cardiology    SUBJECTIVE: Patient intubated and sedated currently   Vitals:   03/30/23 1110 03/30/2023 1113 March 30, 2023 1115 March 30, 2023 1120  BP: (!) 60/49 (!) 73/61 (!) 65/48 (!) 84/65  Pulse: (!) 104 (!) 105 (!) 102 100  Resp: 15 15 15 15   Temp:      TempSrc:      SpO2: 98% 96% 94% 92%  Weight:      Height:         Intake/Output Summary (Last 24 hours) at March 30, 2023 1326 Last data filed at 30-Mar-2023 0400 Gross per 24 hour  Intake 680 ml  Output 350 ml  Net 330 ml      PHYSICAL EXAM  General: Well developed, well nourished, in no acute distress currently on the vent intubated sedated HEENT:  Normocephalic and atramatic Neck:  No JVD.  Lungs: Clear bilaterally to auscultation and percussion. Heart: HRRR . Normal S1 and S2 without gallops or murmurs.  Abdomen: Bowel sounds are positive, abdomen soft and non-tender  Msk:  Back normal, normal gait. Normal strength and tone for age. Extremities: No clubbing, cyanosis or edema.   Neuro: Intubated sedated Psych: Intubated sedated   LABS: Basic Metabolic Panel: Recent Labs    03/15/23 0255 03/30/2023 0350 Mar 30, 2023 0912  NA 133* 134*  --   K 5.5* 6.4* 6.4*  CL 92* 94*  --   CO2 30 21*  --   GLUCOSE 119* 83  --   BUN 71* 82*  --   CREATININE 3.89* 4.76*  --   CALCIUM 9.4 9.6  --   MG 2.2 2.5*  --    Liver Function Tests: No results for input(s): "AST", "ALT", "ALKPHOS", "BILITOT", "PROT", "ALBUMIN" in the last 72 hours. No results for input(s): "LIPASE", "AMYLASE" in the last 72 hours. CBC: Recent Labs    03/15/23 0255 Mar 30, 2023 0350  WBC 11.9* 13.6*  HGB 9.8* 10.0*  HCT 32.6* 33.5*  MCV 101.9* 101.8*  PLT 214 215   Cardiac Enzymes: No results for input(s): "CKTOTAL", "CKMB", "CKMBINDEX", "TROPONINI" in the last 72 hours. BNP: Invalid input(s): "POCBNP" D-Dimer: No results for input(s): "DDIMER" in the last 72 hours. Hemoglobin A1C: No results for input(s): "HGBA1C" in the last 72 hours. Fasting Lipid  Panel: No results for input(s): "CHOL", "HDL", "LDLCALC", "TRIG", "CHOLHDL", "LDLDIRECT" in the last 72 hours. Thyroid Function Tests: No results for input(s): "TSH", "T4TOTAL", "T3FREE", "THYROIDAB" in the last 72 hours.  Invalid input(s): "FREET3" Anemia Panel: No results for input(s): "VITAMINB12", "FOLATE", "FERRITIN", "TIBC", "IRON", "RETICCTPCT" in the last 72 hours.  DG Chest Port 1 View  Result Date: 2023/03/30 CLINICAL DATA:  Status post intubation. EXAM: PORTABLE CHEST 1 VIEW COMPARISON:  Mar 30, 2023 FINDINGS: Endotracheal tube tip is approximately 3 cm above the carina. Stable cardiac enlargement. Bilateral pleural effusions are again noted with mild diffuse interstitial edema. Visualized osseous structures are unremarkable. IMPRESSION: 1. Satisfactory position of endotracheal tube. 2. Persistent bilateral pleural effusions and interstitial edema. Electronically Signed   By: Kerby Moors M.D.   On: 2023-03-30 13:08   [place] EKG SITE RITE  Result Date: 2023-03-30 If Site Rite image not attached, placement could not be confirmed due to current cardiac rhythm.  DG Chest Port 1 View  Result Date: 2023-03-30 CLINICAL DATA:  Dyspnea EXAM: PORTABLE CHEST 1 VIEW COMPARISON:  02/22/2023 FINDINGS: Cardiomegaly. Haziness of the bilateral lower chest that is stable. No Kerley lines, definite effusion, or pneumothorax. IMPRESSION: Continued hazy opacity of the lower chest which could be  atelectasis or infection. Electronically Signed   By: Jorje Guild M.D.   On: April 10, 2023 09:05     Echo preserved left ventricular function EF of at least 55%  TELEMETRY: Sinus tachycardia rate of 110 right bundle branch block nonspecific ST-T wave changes:  ASSESSMENT AND PLAN:  Principal Problem:   Acute on chronic diastolic CHF (congestive heart failure) (HCC) Active Problems:   COPD (chronic obstructive pulmonary disease) (HCC)   Hypertension   CKD (chronic kidney disease), stage IV (HCC)    Macrocytic anemia   Duodenal ulcer   Open wound_in posterior left lower leg    Plan Acute on chronic diastolic congestive heart failure patient not responding to aggressive medical therapy because of hypotension and renal insufficiency Chronic renal sufficiency stage IV worsening symptoms with diuresis and ARB Respiratory failure requiring subsequent intubation ICU for management and care History of duodenal ulcer recommend continue PPI therapy Persistent open wound posterior left lower leg nonhealing Prognosis is grave   Yolonda Kida, MD,  04/10/23 1:26 PM

## 2023-03-17 NOTE — Progress Notes (Signed)
Pt expired at 1400, family at bedside, pronounced by Whitman Hero, and Skeet Latch.

## 2023-03-17 NOTE — Progress Notes (Signed)
[name]  PROGRESS NOTE   Subjective:   K 6.4 on repeat labs. Creatinine elevated to 4.76 from 3.89.   Patient respiratory requirements went from 5L [place] to HFNC. Patient transferred to ICU. Given furosemide 120mg  IV x 1. However with no good response and required intubation for respiratory distress.   Left lower lobe on CXR shows new patchy infiltrate  Objective:  Vital signs: Blood pressure (!) 84/65, pulse 100, temperature 98.1 F (36.7 C), temperature source Axillary, resp. rate 15, height 5\' 11"  (1.803 m), weight 87.3 kg, SpO2 92 %.  Intake/Output Summary (Last 24 hours) at 2023/04/08 1123 Last data filed at 04-08-2023 0400 Gross per 24 hour  Intake 680 ml  Output 350 ml  Net 330 ml    Filed Weights   03/12/23 0735 03/13/23 1133 Apr 08, 2023 1049  Weight: 86.2 kg 85.5 kg 87.3 kg     Physical Exam: General:  Critically ill  Head:  Normocephalic, atraumatic. Moist oral mucosal membranes  Eyes:  Anicteric  Lungs:   Diminished bilaterally  Heart:  Regular   Abdomen:   Soft, nontender, bowel sounds present  Extremities: + peripheral edema.  Neurologic:  Not following commands, moving all four extremities  Skin:  Left lower extremity wound - clean and healing  Access: None    Basic Metabolic Panel: Recent Labs  Lab 03/11/23 0850 03/12/23 1004 03/13/23 0843 03/14/23 0631 03/15/23 0255 08-Apr-2023 0350 04/08/23 0912  NA 136  --  133* 132* 133* 134*  --   K 4.4  --  5.3* 6.2* 5.5* 6.4* 6.4*  CL 98  --  94* 94* 92* 94*  --   CO2 33*  --  28 29 30  21*  --   GLUCOSE 118*  --  133* 120* 119* 83  --   BUN 47*  --  63* 72* 71* 82*  --   CREATININE 2.39*  --  3.29* 3.96* 3.89* 4.76*  --   CALCIUM 8.7*  --  9.4 9.4 9.4 9.6  --   MG  --   --  2.0 2.3 2.2 2.5*  --   PHOS  --  3.4  --   --   --   --   --     GFR: Estimated Creatinine Clearance: 13.8 mL/min (A) (by C-G formula based on SCr of 4.76 mg/dL (H)).  Liver Function Tests: No results for input(s):  "AST", "ALT", "ALKPHOS", "BILITOT", "PROT", "ALBUMIN" in the last 168 hours.  No results for input(s): "LIPASE", "AMYLASE" in the last 168 hours. Recent Labs  Lab 03/14/23 1039  AMMONIA 25     CBC: Recent Labs  Lab 03/12/23 1218 03/13/23 0843 03/14/23 0631 03/15/23 0255 04-08-2023 0350  WBC 9.4 8.7 10.6* 11.9* 13.6*  HGB 9.3* 9.2* 9.7* 9.8* 10.0*  HCT 31.6* 30.5* 32.7* 32.6* 33.5*  MCV 104.3* 101.0* 102.8* 101.9* 101.8*  PLT 166 177 205 214 215      HbA1C: Hgb A1c MFr Bld  Date/Time Value Ref Range Status  10/02/2022 11:16 PM 6.3 (H) 4.8 - 5.6 % Final    Comment:    (NOTE) Pre diabetes:          5.7%-6.4%  Diabetes:              >6.4%  Glycemic control for   <7.0% adults with diabetes     Urinalysis: No results for input(s): "COLORURINE", "LABSPEC", "PHURINE", "GLUCOSEU", "HGBUR", "BILIRUBINUR", "KETONESUR", "PROTEINUR", "UROBILINOGEN", "NITRITE", "LEUKOCYTESUR" in the last 72 hours.  Invalid input(s): "  APPERANCEUR"    Imaging: [place] EKG SITE RITE  Result Date: 03-22-2023 If Site Rite image not attached, placement could not be confirmed due to current cardiac rhythm.  DG Chest Port 1 View  Result Date: 03-22-2023 CLINICAL DATA:  Dyspnea EXAM: PORTABLE CHEST 1 VIEW COMPARISON:  02/23/2023 FINDINGS: Cardiomegaly. Haziness of the bilateral lower chest that is stable. No Kerley lines, definite effusion, or pneumothorax. IMPRESSION: Continued hazy opacity of the lower chest which could be atelectasis or infection. Electronically Signed   By: Jorje Guild M.D.   On: 03/22/23 09:05     Medications:    sodium chloride     fentaNYL infusion INTRAVENOUS     furosemide     norepinephrine (LEVOPHED) Adult infusion     vasopressin       ascorbic acid  500 mg Oral BID   bacitracin   Topical BID   calcitRIOL  0.25 mcg Oral Daily   Chlorhexidine Gluconate Cloth  6 each Topical Daily   etomidate  20 mg Intravenous Once   feeding supplement  237 mL Oral TID BM    gabapentin  100 mg Oral BID   heparin injection (subcutaneous)  5,000 Units Subcutaneous Q12H   iron polysaccharides  150 mg Oral Daily   midodrine  20 mg Oral TID WC   multivitamin  1 tablet Oral QHS   mouth rinse  15 mL Mouth Rinse Q2H   pantoprazole  40 mg Oral BID   patiromer  25.2 g Oral Daily   rocuronium bromide  100 mg Intravenous Once    Assessment/ Plan:   John [name] is a 77 y.o. black male with chronic diastolic congestive heart failure, hypertension, COPD, hyperlipidemia, who is admitted to [facility] on 03/07/2023 for Acute pulmonary edema [J81.0] Acute on chronic diastolic CHF (congestive heart failure) [I50.33] Acute on chronic respiratory failure with hypoxia [J96.21] Acute on chronic congestive heart failure, unspecified heart failure type [I50.9]   #1: Acute kidney injury with hyperkalemia on Chronic kidney disease stage IIIB: creatinine baseline 2.19, GFR of 31. Concern for recurrent injury. Possibly from IV furosemide given on 3/27 and 3/28? However now with hyperkalemia. Low threshold to reinitiate dialysis.  - high dose IV furosemide - Change potassium binder to [place] (less volume retention than Lokelma) - Patient may need renal replacement therapy. However due to his hypotension and hemodynamic instability, we will try to address those to see if that improves renal perfusion.    #2: Acute respiratory failure secondary to acute exacerbation of chronic diastolic congestive heart failure. CXR could show signs of pneumonia.  Status post intubation and mechanical ventilation Appreciate critical care input.    #3: Hypotension: on midodrine 10 mg 3 times daily. - low threshold for vasopressors.   #4: Anemia with chronic kidney disease and acute blood loss with recent GI bleed. Hemoglobin is stable, 10. Macrocytic.    LOS: [facility] 03-21-2410:23 AM

## 2023-03-17 NOTE — Progress Notes (Signed)
   Apr 04, 2023 1145  Spiritual Encounters  Type of Visit Initial  Care provided to: Family  Reason for visit Urgent spiritual support  OnCall Visit Yes   Chaplain encountered significant other of patient at rm [id number], looking for patient who had been transferred to [place]. Chaplain assisted.

## 2023-03-17 NOTE — Patient Outreach (Signed)
Care Coordination   Follow Up Visit Note   03/17/2023 Name: John [name] MRN: [id number] DOB: 09-Oct-1946  John [name] is a 77 y.o. year old male who sees Carnegie, Aura Fey, FNP for primary care.   What matters to the patients health and wellness today? Patient passes away while hospitalized on March 19, 2023.   Goals Addressed             This Visit's Progress    COMPLETED: RNCM: Effective Management of CHF       Care Coordination Interventions:  BP Readings from Last 3 Encounters:  03/28/22 99/82  02/25/22 91/70  02/06/22 108/89    Wt Readings from Last 3 Encounters:  03/28/22 192 lb 8 oz (87.3 kg)  02/25/22 203 lb 4 oz (92.2 kg)  02/06/22 208 lb 15.9 oz (94.8 kg)   Stated weight today was 193 (07-17-2022) Basic overview and discussion of pathophysiology of Heart Failure reviewed. 07-17-2022: The patient has a good understanding of heart failure. Has had 2 hospitalizations in the past for fluid overload.  Provided education on low sodium diet. 07-17-2022. Education and support given. The patient knows to watch his sodium. He can gauge how well he is doing by monitoring weight daily. States with his kidney problems and heart problems he has to be careful about his fluid intake. He states he gets thirsty easily and likes to drink water but that can be bad for him. Recommendation made to try a cup of ice and when he feels thirsty to suck on some ice cubes instead of drinking water. Will continue to monitor for changes.  Reviewed Heart Failure Action Plan in depth and provided written copy. 07-17-2022: The patient has parameters to call his providers for weight greater than 195. Usual readings are 193 to 195. One day this week his weight was 198 and he knew to take his additional fluid pill as prescribed by his nephrologist.  Assessed need for readable accurate scales in home. 07-17-2022: Scales in home and used daily Provided education about placing scale on hard, flat surface Advised patient to  weigh each morning after emptying bladder Discussed importance of daily weight and advised patient to weigh and record daily Reviewed role of diuretics in prevention of fluid overload and management of heart failure. 07-17-2022: No issues with medications and patient also takes a potassium supplement; Discussed the importance of keeping all appointments with provider. 07-17-2022: The patient is compliant with provider appointments. Last seen by pcp on 07-03-2022 Provided patient with education about the role of exercise in the management of heart failure. 07-17-2022: Review of exercise habits. The patient plays tennis 4 times a week if his schedule allows, [facility] each day he goes, usually her goes 3 to 4 times a week. The patient likes to stay active Reviewed with the patient resources available if needed in the future for SDOH or other needs Advised patient to discuss changes in weight, questions or concerns about heart failure or heart health with provider Screening for signs and symptoms of depression related to chronic disease state  Assessed social determinant of health barriers  EF is 45 to 50%  Update 4/1 - case closed, patient now deceased.           SDOH assessments and interventions completed:  No     Care Coordination Interventions:  No, not indicated   Follow up plan: No further intervention required.   Encounter Outcome:  Pt. Visit Completed  Valente Devinn, RN, MSN, [facility] Care Management Coordinator [phone]

## 2023-03-17 NOTE — Death Summary Note (Signed)
Patient passed away at 1400.   May he rest in peace.   Arcelia Jew MD Pulmonary & Critical Care Medicine

## 2023-03-17 NOTE — Progress Notes (Signed)
       CROSS COVER NOTE  NAME: John [name] MRN: [id number] DOB : [birth year]    HPI/Events of Note   Report:ootassium 6.4  On review of chart:reviewed    Assessment and  Interventions   Assessment:  Plan: 5 units IV insulin f/b amp D50 EKG Already on lokelma BID       Kathlene Cote NP [facility]

## 2023-03-17 NOTE — Progress Notes (Signed)
PROGRESS NOTE    John [name]  [id number] DOB: 1946-12-05 DOA: 03/04/2023 PCP: Alisa Graff, FNP   Brief Narrative:   77 y.o. male with medical history significant of hypertension, hyperlipidemia, COPD on 3 L oxygen, diastolic CHF CKD stage IV, duodenal ulcer with bleeding, recent admission due to hypovolemic and cardiogenic shock, who presents with shortness of breath.  Patient was recently hospitalized from 2/14 - 3/19 for hypovolemic/cardiogenic shock in the setting of duodenal bleeding ulcer.  Upon discharge his losartan, metolazone and torsemide were held.  Now coming in with CHF exacerbation.  During this admission he has been cautiously diuresed but due to soft blood pressure this is off-and-on been held.  He was also started on midodrine.  Nephrology team has been following the patient due to CKD stage IV but no indication for hemodialysis yet.  Due to soft blood pressure and elevated creatinine, diuresis cautiously.  Due to worsening mentation and renal function along with shortness of breath he was transferred to ICU on April 13, 2023 and intubated.  After extensive discussion with patient's girlfriend, patient was transition to comfort care.   Assessment & Plan:  Principal Problem:   Acute on chronic diastolic CHF (congestive heart failure) (HCC) Active Problems:   CKD (chronic kidney disease), stage IV (HCC)   Hypertension   COPD (chronic obstructive pulmonary disease) (HCC)   Macrocytic anemia   Duodenal ulcer   Open wound_in posterior left lower leg    Acute on chronic diastolic CHF (congestive heart failure) ([place]); Class III : 2D echo on 01/30/2023 showed EF of 50-55% with grade 1 diastolic dysfunction.  Due to recent hypovolemic shock, patient's diuretics were on hold.  He developed bilateral leg edema, shortness breath, positive JVD, elevated BNP 2659, clinically consistent with CHF exacerbation. [facility] cardiology following.  Soft blood pressure unable to tolerate dialysis.    Acute kidney injury CKD (chronic kidney disease), stage IV (HCC) Hyperkalemia Acute respiratory failure Significant worsening of creatinine and volume status.  Blood pressure also remains soft.  He was transferred to ICU and intubated.  Soon after I had discussion with patient's girlfriend along with nephrology team, Dr.Kolluru.  We agreed given poor prognosis and advanced medical condition, patient should be transition to comfort care   Hypertension:  -IV hydralazine as needed but due to hypotension he is on midodrine.   COPD (chronic obstructive pulmonary disease) (HCC) -Bronchodilators   Macrocytic anemia:  Hb stable.    Duodenal ulcer PPI BID   Open wound_in posterior left lower leg: Seen by wound care team.  No obvious evidence of infection.  Wound care recommendations:Topical treatment orders provided for bedside nurses to perform as follows to absorb drainage and provide antimicrobial benefits: Apply Aquacel Kellie Simmering # [phone]) to left leg wound Q day, then cover with ABD pad and kerlex.    Patient will be transition to comfort care.  PCCM provider to discuss case with patient's girlfriend and likely proceed with terminal extubation   Family communication : Consults :nephrology, [facility] Cardiology CODE STATUS: Full DVT Prophylaxis :SCD  Status is: Inpatient Remains inpatient appropriate because: CHF   Overall long term poor prognosis.  Comfort care   Subjective: This morning patient was noted to be dyspneic, lethargic. Patient was transferred to the ICU and had to be emergently intubated.  I spoke with patient's girlfriend, only known person known to him, along with nephrologist.  Everyone agreed that patient has significant worsening in his mental status and rapid decline in his health.  At  this time no scope of recovery therefore transitioning him to comfort care Examination: Constitutional: Obtunded Respiratory: Bilateral rhonchi with diffuse expiratory  wheezing Cardiovascular: Normal sinus rhythm, no rubs Abdomen: Nontender nondistended good bowel sounds Musculoskeletal: No edema noted Skin: No rashes seen Neurologic: Unable to assess Psychiatric: Unable to assess Foley in place.   Objective: Vitals:   03/15/23 1944 03/15/23 2328 2023/04/08 0505 08-Apr-2023 0620  BP: 106/64 94/65 (!) 77/60 (!) 82/57  Pulse: (!) 104 87 99 97  Resp: 18 19 18    Temp: (!) 97.5 F (36.4 C) 98.1 F (36.7 C)    TempSrc:      SpO2: 92% 94%    Weight:      Height:        Intake/Output Summary (Last 24 hours) at April 08, 2023 0804 Last data filed at 04-08-23 0400 Gross per 24 hour  Intake 680 ml  Output 350 ml  Net 330 ml   Filed Weights   03/11/23 0912 03/12/23 0735 03/13/23 1133  Weight: 84.5 kg 86.2 kg 85.5 kg     Data Reviewed:   CBC: Recent Labs  Lab 03/12/23 1218 03/13/23 0843 03/14/23 0631 03/15/23 0255 2023-04-08 0350  WBC 9.4 8.7 10.6* 11.9* 13.6*  HGB 9.3* 9.2* 9.7* 9.8* 10.0*  HCT 31.6* 30.5* 32.7* 32.6* 33.5*  MCV 104.3* 101.0* 102.8* 101.9* 101.8*  PLT 166 177 205 214 [id number]   Basic Metabolic Panel: Recent Labs  Lab 03/11/23 0850 03/12/23 1004 03/13/23 0843 03/14/23 0631 03/15/23 0255 04-08-23 0350  NA 136  --  133* 132* 133* 134*  K 4.4  --  5.3* 6.2* 5.5* 6.4*  CL 98  --  94* 94* 92* 94*  CO2 33*  --  28 29 30  21*  GLUCOSE 118*  --  133* 120* 119* 83  BUN 47*  --  63* 72* 71* 82*  CREATININE 2.39*  --  3.29* 3.96* 3.89* 4.76*  CALCIUM 8.7*  --  9.4 9.4 9.4 9.6  MG  --   --  2.0 2.3 2.2 2.5*  PHOS  --  3.4  --   --   --   --    GFR: Estimated Creatinine Clearance: 13.8 mL/min (A) (by C-G formula based on SCr of 4.76 mg/dL (H)). Liver Function Tests: No results for input(s): "AST", "ALT", "ALKPHOS", "BILITOT", "PROT", "ALBUMIN" in the last 168 hours.  No results for input(s): "LIPASE", "AMYLASE" in the last 168 hours. Recent Labs  Lab 03/14/23 1039  AMMONIA 25   Coagulation Profile: No results for input(s):  "INR", "PROTIME" in the last 168 hours. Cardiac Enzymes: No results for input(s): "CKTOTAL", "CKMB", "CKMBINDEX", "TROPONINI" in the last 168 hours. BNP (last 3 results) No results for input(s): "PROBNP" in the last 8760 hours. HbA1C: No results for input(s): "HGBA1C" in the last 72 hours. CBG: No results for input(s): "GLUCAP" in the last 168 hours. Lipid Profile: No results for input(s): "CHOL", "HDL", "LDLCALC", "TRIG", "CHOLHDL", "LDLDIRECT" in the last 72 hours. Thyroid Function Tests: No results for input(s): "TSH", "T4TOTAL", "FREET4", "T3FREE", "THYROIDAB" in the last 72 hours. Anemia Panel: No results for input(s): "VITAMINB12", "FOLATE", "FERRITIN", "TIBC", "IRON", "RETICCTPCT" in the last 72 hours. Sepsis Labs: No results for input(s): "PROCALCITON", "LATICACIDVEN" in the last 168 hours.  No results found for this or any previous visit (from the past 240 hour(s)).       Radiology Studies: No results found.      Scheduled Meds:  ascorbic acid  500 mg Oral BID  bacitracin   Topical BID   calcitRIOL  0.25 mcg Oral Daily   Chlorhexidine Gluconate Cloth  6 each Topical Daily   feeding supplement  237 mL Oral TID BM   gabapentin  100 mg Oral BID   heparin injection (subcutaneous)  5,000 Units Subcutaneous Q12H   iron polysaccharides  150 mg Oral Daily   midodrine  10 mg Oral TID WC   multivitamin  1 tablet Oral QHS   pantoprazole  40 mg Oral BID   sodium zirconium cyclosilicate  10 g Oral BID   Continuous Infusions:   LOS: 11 days   Time spent= 35 mins    Worthy Boschert Arsenio Loader, MD [facility]  If 7PM-7AM, please contact night-coverage  April 01, 2023, 8:04 AM

## 2023-03-17 NOTE — Progress Notes (Signed)
Advanced Care Planning  I spoke with his long term partner Tawni Carnes and discussed his poor prognosis. She confirms that he is comfort measures only and a DNR. I discussed the details of the process.   She does not want to be there for his extubation, but she wants to be by his side for everything else.   Plan for extubation, parenteral meds for sedation and analgesia in addition to secretion management.   - CMO  CCT: 20 minutes  Arcelia Jew MD Pulmonary & Critical Care Medicine

## 2023-03-17 NOTE — Procedures (Signed)
Arterial Line Placement: Indication: Frequent blood draws; Invasive BP monitoring.   Consent: Emergent.   Hand washing performed prior to starting the procedure.   Procedure: An active timeout was performed and correct patient, name, & ID confirmed. Physicial exam was performed to ensure adequate perfusion.  Using sterile technique, an aterial line was inserted into the right Femoral artery.  Catheter threaded and the needle was removed with appropriate blood return.  Arterial waveform was noted.  After the procedure, the patient's extremities were observed to be pink and warm.   Estimated Blood Loss: None .   Number of Attempts: 1.   Complications: None .  Operator: Benny Lennert MD Pulmonary & Critical Care Medicine

## 2023-03-17 NOTE — Progress Notes (Signed)
PT continues to be minimally interactive , reluctantly took pills after much encouragement. When patient does respond pt is irritated and short with responses.

## 2023-03-17 NOTE — Progress Notes (Signed)
Patient extubated per Dr. Montine Circle. Patient extubated to comfort care. No issues with extubation.

## 2023-03-17 NOTE — Discharge Summary (Signed)
Patient Name:  John [name]  MRN: [id number]  PCP: Alisa Graff, FNP  DOB:  1946-12-12       Date of Admission:  03/04/2023  Date of Discharge:  March 17, 2023      Attending Physician: Dr. Bronson Ing, MD         DISCHARGE DIAGNOSES: Principal Problem:   Acute on chronic diastolic CHF (congestive heart failure) ([place]) Active Problems:   COPD (chronic obstructive pulmonary disease) ([place])   Hypertension   CKD (chronic kidney disease), stage IV (HCC)   Macrocytic anemia   Duodenal ulcer   Open wound_in posterior left lower leg    DISPOSITION AND FOLLOW-UP: John [name] is to follow-up with the listed providers as detailed below, at patient's visiting, please address following issues:   Death; patient passed away at [place]: none  CONSULTS:  Treatment Team:  Lavonia Dana, MD    PROCEDURES PERFORMED:  DG Chest Port 1 View  Result Date: 2023/03/17 CLINICAL DATA:  Status post intubation. EXAM: PORTABLE CHEST 1 VIEW COMPARISON:  17-Mar-2023 FINDINGS: Endotracheal tube tip is approximately 3 cm above the carina. Stable cardiac enlargement. Bilateral pleural effusions are again noted with mild diffuse interstitial edema. Visualized osseous structures are unremarkable. IMPRESSION: 1. Satisfactory position of endotracheal tube. 2. Persistent bilateral pleural effusions and interstitial edema. Electronically Signed   By: Kerby Moors M.D.   On: Mar 17, 2023 13:08   [place] EKG SITE RITE  Result Date: 03/17/23 If Site Rite image not attached, placement could not be confirmed due to current cardiac rhythm.  DG Chest Port 1 View  Result Date: March 17, 2023 CLINICAL DATA:  Dyspnea EXAM: PORTABLE CHEST 1 VIEW COMPARISON:  03/15/2023 FINDINGS: Cardiomegaly. Haziness of the bilateral lower chest that is stable. No Kerley lines, definite effusion, or pneumothorax. IMPRESSION: Continued hazy opacity of the lower chest which could be atelectasis or infection.  Electronically Signed   By: Jorje Guild M.D.   On: 2023/03/17 09:05   DG Chest Port 1 View  Result Date: 03/08/2023 CLINICAL DATA:  Shortness of breath. EXAM: PORTABLE CHEST 1 VIEW COMPARISON:  February 04, 2023. FINDINGS: Stable cardiomediastinal silhouette. Mild right basilar atelectasis or infiltrate is noted with associated pleural effusion. Left lung is unremarkable. Bony thorax is unremarkable. IMPRESSION: Mild right basilar atelectasis or infiltrate is noted with associated pleural effusion. Electronically Signed   By: Marijo Conception M.D.   On: 03/14/2023 13:10   CT ANGIO GI BLEED  Result Date: 02/19/2023 CLINICAL DATA:  Bloody bowel movement. Concern for active gastrointestinal bleeding. EXAM: CTA ABDOMEN AND PELVIS WITHOUT AND WITH CONTRAST TECHNIQUE: Multidetector CT imaging of the abdomen and pelvis was performed using the standard protocol during bolus administration of intravenous contrast. Multiplanar reconstructed images and MIPs were obtained and reviewed to evaluate the vascular anatomy. RADIATION DOSE REDUCTION: This exam was performed according to the departmental dose-optimization program which includes automated exposure control, adjustment of the mA and/or kV according to patient size and/or use of iterative reconstruction technique. CONTRAST:  22mL OMNIPAQUE IOHEXOL 350 MG/ML SOLN COMPARISON:  None Available. FINDINGS: VASCULAR Aorta: Atherosclerotic calcification of the aorta. Celiac: Patent without evidence of aneurysm, dissection, vasculitis or significant stenosis. SMA: Patent without evidence of aneurysm, dissection, vasculitis or significant stenosis. Renals: Both renal arteries are patent without evidence of aneurysm, dissection, vasculitis, fibromuscular dysplasia or significant stenosis. IMA: Patent without evidence of aneurysm, dissection, vasculitis or significant stenosis. Inflow: Patent without evidence of aneurysm, dissection, vasculitis or significant stenosis.  Proximal Outflow: Aortic calcification. Atherosclerotic calcification. No aneurysm. Veins: No obvious venous abnormality within the limitations of this arterial phase study. Review of the MIP images confirms the above findings. NON-VASCULAR Lower chest: Lung bases are clear. Hepatobiliary: No focal hepatic lesion. Normal gallbladder. No biliary duct dilatation. Common bile duct is normal. Pancreas: Pancreas is normal. No ductal dilatation. No pancreatic inflammation. Spleen: Normal spleen Adrenals/urinary tract: Adrenal glands and kidneys are normal. The ureters and bladder normal. Stomach/Bowel: Stomach is normal. On delayed portal venous phase imaging there is a collection of endoluminal contrast within the second portion the duodenum (image 32/11). Findings consistent with active CT chest of bleeding. On arterial phase imaging there is a tiny focus of intraluminal contrast at this site (image 67/5). No evidence of active gastrointestinal bleeding within the small bowel. No small bowel inflammation or dilatation. There is high-density material within the colon. No evidence of active extravasation IV contrast into the lumen of the LEFT or RIGHT colon. Rectosigmoid colon likewise unremarkable except for several diverticula. Rectum unremarkable. Vascular/Lymphatic: Abdominal aorta is normal caliber with atherosclerotic calcification. There is no retroperitoneal or periportal lymphadenopathy. No pelvic lymphadenopathy. Reproductive: Prostate unremarkable Other: Mild sarcoma of the soft tissues. Musculoskeletal: Degenerative osteophytosis of the spine. IMPRESSION: VASCULAR 1. Active gastrointestinal bleeding in the second portion duodenum evident on the portal venous phase imaging. 2. Atherosclerotic calcification of the aorta is branches without high-grade stenosis. NON-VASCULAR 1. No evidence of bowel obstruction or inflammation. These results will be called to the ordering clinician or representative by the  Radiologist Assistant, and communication documented in the PACS or [employer]. Electronically Signed   By: Suzy Bouchard M.D.   On: 02/19/2023 13:59   PERIPHERAL VASCULAR CATHETERIZATION  Result Date: 02/17/2023 See surgical note for result.      ADMISSION DATA: H&P:   Physical Exam: Heart: no pulse Pulm: no breaths  Labs:   HOSPITAL COURSE:   77 y.o. male with medical history significant of hypertension, hyperlipidemia, COPD on 3 L oxygen, diastolic CHF CKD stage IV, duodenal ulcer with bleeding, recent admission due to hypovolemic and cardiogenic shock, who presents with shortness of breath.  Patient was recently hospitalized from 2/14 - 3/19 for hypovolemic/cardiogenic shock in the setting of duodenal bleeding ulcer.  Upon discharge his losartan, metolazone and torsemide were held.  Now coming in with CHF exacerbation.  During this admission he has been cautiously diuresed but due to soft blood pressure this is off-and-on been held.  He was also started on midodrine.  Nephrology team has been following the patient due to CKD stage IV but no indication for hemodialysis yet.  Due to soft blood pressure and elevated creatinine, diuresis cautiously.  Due to worsening mentation and renal function along with shortness of breath he was transferred to ICU on March 23, 2023 and intubated.  After extensive discussion with patient's girlfriend, patient was transition to comfort care.     Assessment & Plan:  Principal Problem:   Acute on chronic diastolic CHF (congestive heart failure) (HCC) Active Problems:   CKD (chronic kidney disease), stage IV (HCC)   Hypertension   COPD (chronic obstructive pulmonary disease) (HCC)   Macrocytic anemia   Duodenal ulcer   Open wound_in posterior left lower leg     Acute on chronic diastolic CHF (congestive heart failure) ([place]); Class III : 2D echo on 01/30/2023 showed EF of 50-55% with grade 1 diastolic dysfunction.  Due to recent hypovolemic shock,  patient's diuretics were on hold.  He developed bilateral  leg edema, shortness breath, positive JVD, elevated BNP 2659, clinically consistent with CHF exacerbation. [facility] cardiology following.  Soft blood pressure unable to tolerate dialysis.   Acute kidney injury CKD (chronic kidney disease), stage IV (HCC) Hyperkalemia Acute respiratory failure Significant worsening of creatinine and volume status.  Blood pressure also remains soft.  He was transferred to ICU and intubated.  Soon after I had discussion with patient's girlfriend along with nephrology team, Dr.Kolluru.  We agreed given poor prognosis and advanced medical condition, patient should be transition to comfort care   Hypertension:  -IV hydralazine as needed but due to hypotension he is on midodrine.   COPD (chronic obstructive pulmonary disease) (HCC) -Bronchodilators   Macrocytic anemia:  Hb stable.    Duodenal ulcer PPI BID   Open wound_in posterior left lower leg: Seen by wound care team.  No obvious evidence of infection.  Wound care recommendations:Topical treatment orders provided for bedside nurses to perform as follows to absorb drainage and provide antimicrobial benefits: Apply Aquacel Kellie Simmering # [phone]) to left leg wound Q day, then cover with ABD pad and kerlex.    Patient will be transition to comfort care.  PCCM provider to discuss case with patient's girlfriend and likely proceed with terminal extubation.   ICU:  Patient arrived in extremis due to worsening uremia and respiratory failure. After brief discussion with partner Tawni Carnes, we decided to proceed as a full code status patient and she confirmed this. We intubated him. We also got central and arterial lines placed. He was on two pressors at this time to keep MAP >65. Meanwhile, Dr. Reesa Chew was having conversation with [name] about Goals of care. Given poor prognosis, family agreed to make it comfortably.   Patient passed away at 1400. May he rest in peace.      DISCHARGE DATA: Vital Signs: BP (!) 84/65   Pulse 100   Temp 98.1 F (36.7 C) (Axillary)   Resp 15   Ht 5\' 11"  (1.803 m)   Wt 87.3 kg   SpO2 92%   BMI 26.84 kg/m   Labs: Results for orders placed or performed during the hospital encounter of 03/11/2023 (from the past 24 hour(s))  Basic metabolic panel     Status: Abnormal   Collection Time: 2023-03-19  3:50 AM  Result Value Ref Range   Sodium 134 (L) 135 - 145 mmol/L   Potassium 6.4 (HH) 3.5 - 5.1 mmol/L   Chloride 94 (L) 98 - 111 mmol/L   CO2 21 (L) 22 - 32 mmol/L   Glucose, Bld 83 70 - 99 mg/dL   BUN 82 (H) 8 - 23 mg/dL   Creatinine, Ser 4.76 (H) 0.61 - 1.24 mg/dL   Calcium 9.6 8.9 - 10.3 mg/dL   GFR, Estimated 12 (L) >60 mL/min   Anion gap 19 (H) 5 - 15  CBC     Status: Abnormal   Collection Time: 03/19/23  3:50 AM  Result Value Ref Range   WBC 13.6 (H) 4.0 - 10.5 K/uL   RBC 3.29 (L) 4.22 - 5.81 MIL/uL   Hemoglobin 10.0 (L) 13.0 - 17.0 g/dL   HCT 33.5 (L) 39.0 - 52.0 %   MCV 101.8 (H) 80.0 - 100.0 fL   MCH 30.4 26.0 - 34.0 pg   MCHC 29.9 (L) 30.0 - 36.0 g/dL   RDW 19.9 (H) 11.5 - 15.5 %   Platelets 215 150 - 400 K/uL   nRBC 1.5 (H) 0.0 - 0.2 %  Magnesium  Status: Abnormal   Collection Time: 03-27-23  3:50 AM  Result Value Ref Range   Magnesium 2.5 (H) 1.7 - 2.4 mg/dL  Potassium     Status: Abnormal   Collection Time: 2023-03-27  9:12 AM  Result Value Ref Range   Potassium 6.4 (HH) 3.5 - 5.1 mmol/L  Glucose, capillary     Status: Abnormal   Collection Time: 27-Mar-2023 10:09 AM  Result Value Ref Range   Glucose-Capillary 111 (H) 70 - 99 mg/dL  MRSA Next Gen by PCR, Nasal     Status: None   Collection Time: 27-Mar-2023 11:00 AM   Specimen: Nasal Mucosa; Nasal Swab  Result Value Ref Range   MRSA by PCR Next Gen NOT DETECTED NOT DETECTED     Time Spent on Discharge: 64 min [place]

## 2023-03-17 NOTE — Procedures (Signed)
Central Venous Catheter Placement:TRIPLE LUMEN   Procedure: Insertion of Non-tunneled Central Venous Catheter(36556) with US guidance [id number])   Indication(s) Medication administration and Difficult access  CVP monitoring Patient receiving vesicant or irritant drug.; Patient receiving intravenous therapy for longer than 5 days.; Patient has limited or no vascular access.    Consent Risks of the procedure as well as the alternatives and risks of each were explained to the patient and/or caregiver.  Consent for the procedure was obtained and is signed in the bedside chart  Consent:emergent   Anesthesia Topical only with 1% lidocaine    Timeout Verified patient identification, verified procedure, site/side was marked, verified correct patient position, special equipment/implants available, medications/allergies/relevant history reviewed, required imaging and test results available. Patient comfort was obtained.     Sterile Technique Maximal sterile technique including full sterile barrier drape, hand hygiene, sterile gown, sterile gloves, mask, hair covering, sterile ultrasound probe cover (if used).   Hand washing performed prior to starting the procedure.    Procedure Description Area of catheter insertion was cleaned with chlorhexidine and draped in sterile fashion.  With real-time ultrasound guidance a central venous catheter was placed into the right internal jugular vein. Nonpulsatile blood flow and easy flushing noted in all ports.  The catheter was sutured in place and sterile dressing applied.   A triple lumen catheter was placed in right femoral vein. There was good blood return, catheter caps were placed on lumens, catheter flushed easily, the line was secured and a sterile dressing and BIO-PATCH applied.    Complications/Tolerance None; patient tolerated the procedure well.     EBL Minimal   Specimen(s) None   Number of Attempts: 1 Complications:none Estimated  Blood Loss: none Operator: Benny Lennert MD Pulmonary & Critical Care Medicine

## 2023-03-17 NOTE — IPAL (Signed)
  Interdisciplinary Goals of [name] Family Meeting   Date carried out: 04/15/2023  Location of the meeting: Unit - ICU  Member's involved: Dr Gerlean Ren, Dr Juleen China and Tawni Carnes.   Durable Power of Attorney or acting medical decision maker: Tawni Carnes, only known person to the patient  Discussion: We discussed goals of [name] for John [name] .  I explained patient's critical condition including rapidly declining respiratory status, renal function complicating his mentation and cardiac status.  This continues to be a recurrent issue for him.  Given very poor compliance, advanced comorbidities that she has very poor prognosis.  John [name] agreed that patient should be transition to comfort [name]  Code status:   Code Status: DNR   Disposition: In-patient comfort [name]  Time spent for the meeting: 20 minutes    Henna Derderian Arsenio Loader, MD  04-15-23, 1:05 PM

## 2023-03-17 NOTE — Procedures (Signed)
Endotracheal Intubation: Patient required placement of an artificial airway secondary to Respiratory Failure  Consent: Emergent.   Hand washing performed prior to starting the procedure.   Medications administered for sedation prior to procedure:  Etomidate 20mg , 100mg  rocuronium   A time out procedure was called and correct patient, name, & ID confirmed. Needed supplies and equipment were assembled and checked to include ETT, 10 ml syringe, Glidescope, Mac and Miller blades, suction, oxygen and bag mask valve, end tidal CO2 monitor.   Patient was positioned to align the mouth and pharynx to facilitate visualization of the glottis.   Heart rate, SpO2 and blood pressure was continuously monitored during the procedure. Pre-oxygenation was conducted prior to intubation and endotracheal tube was placed through the vocal cords into the trachea.     The artificial airway was placed under direct visualization via glidescope route using a 7-5 ETT on the first attempt.  ETT was secured at 25 cm mark.  Placement was confirmed by auscuitation of lungs with good breath sounds bilaterally and no stomach sounds.  Condensation was noted on endotracheal tube.   Pulse ox 98%.  CO2 detector in place with appropriate color change.   Complications: None .   Operator: Lamia Mariner  Chest radiograph ordered and pending.   Comments: OGT placed via glidescope.  Arcelia Jew MD Pulmonary & Critical Care Medicine

## 2023-03-17 DEATH — deceased

## 2023-11-03 IMAGING — CT CT CHEST-ABD-PELV W/O CM
2 of 4 series · 13 of 36 positions shown, 15 images · non-contrast
Comparison: None.

CLINICAL DATA: Thrombocytopenia



[Series 2: cap wo st · axial · 0.98mm/px · z∈[-903,-343]mm · 10 of 138 slices shown, 12 images]
[im 13/138  mediastinal]
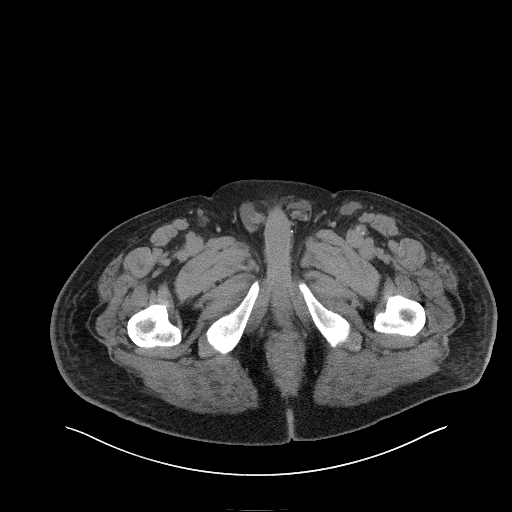
[im 13/138  bone]
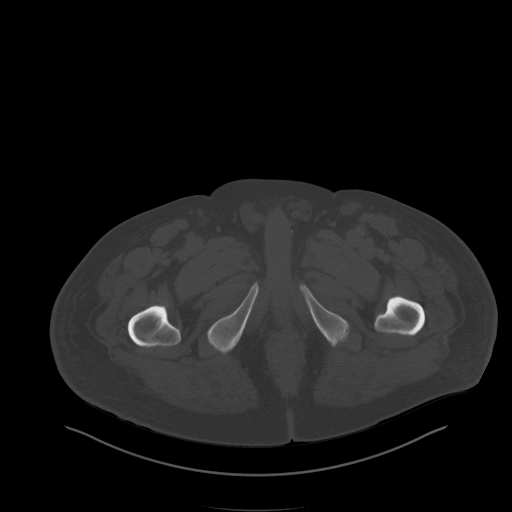
[im 25/138  mediastinal]
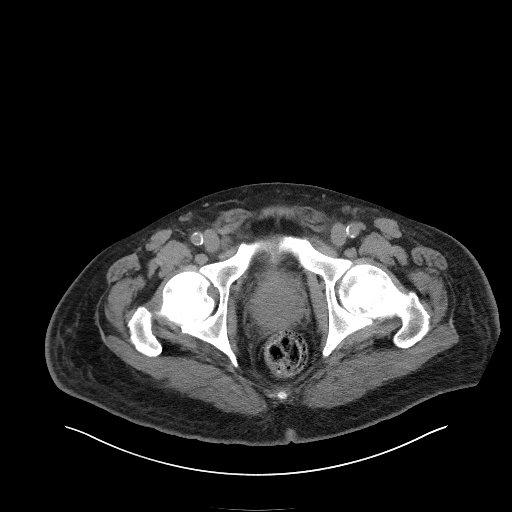
[im 38/138  mediastinal]
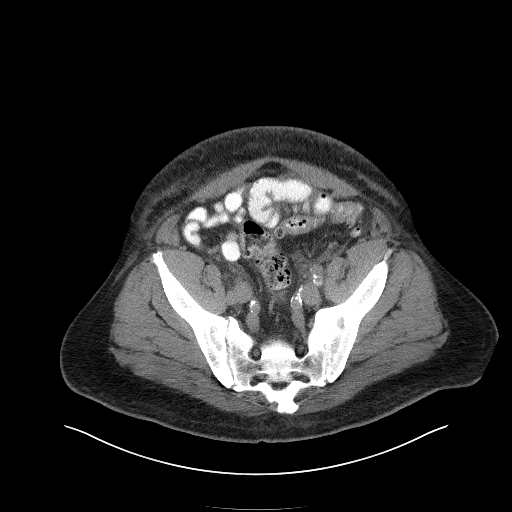
[im 50/138  mediastinal]
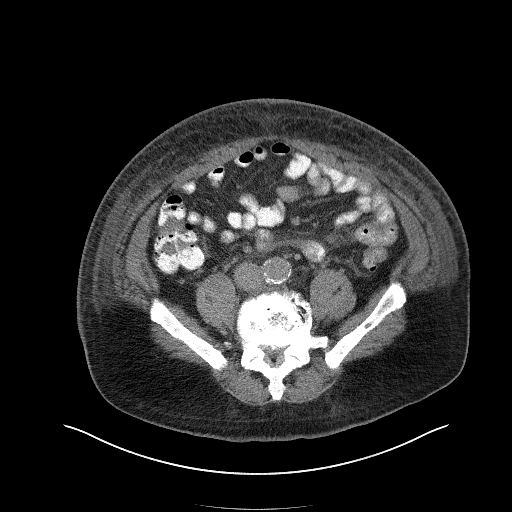
[im 63/138  mediastinal]
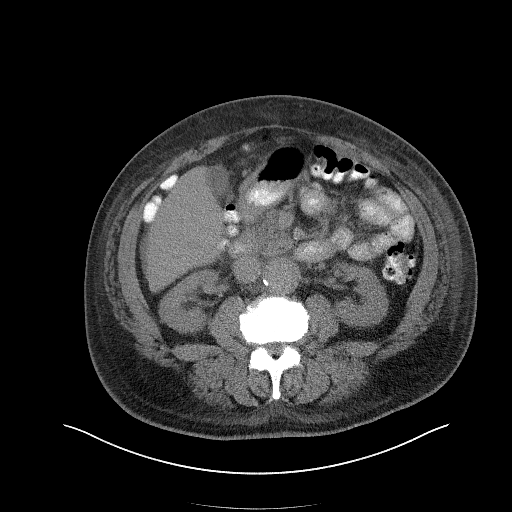
[im 75/138  mediastinal]
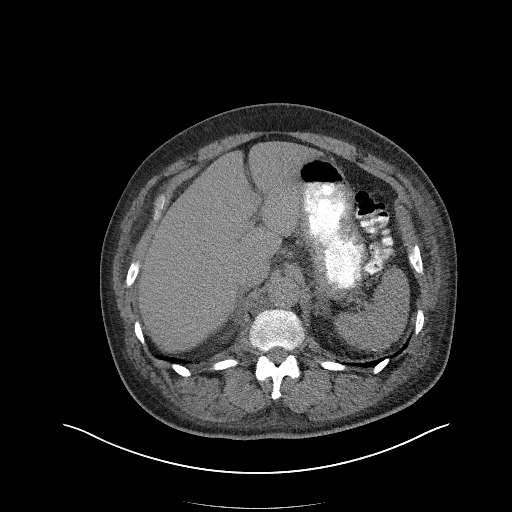
[im 88/138  mediastinal]
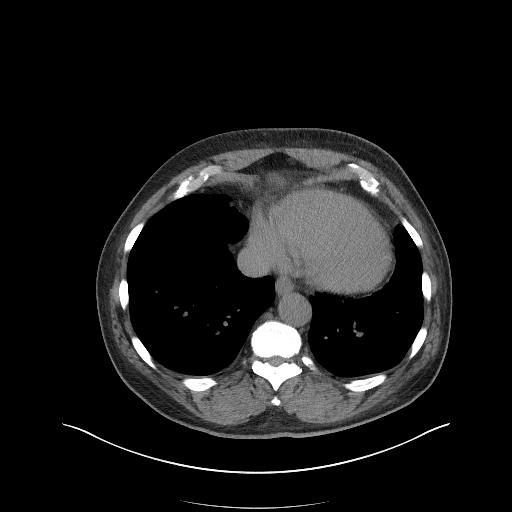
[im 100/138  mediastinal]
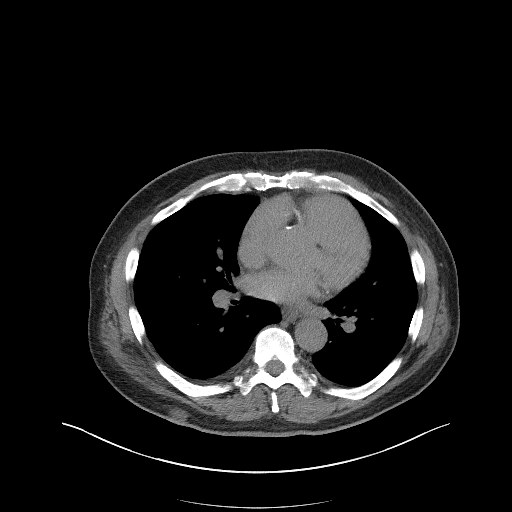
[im 113/138  mediastinal]
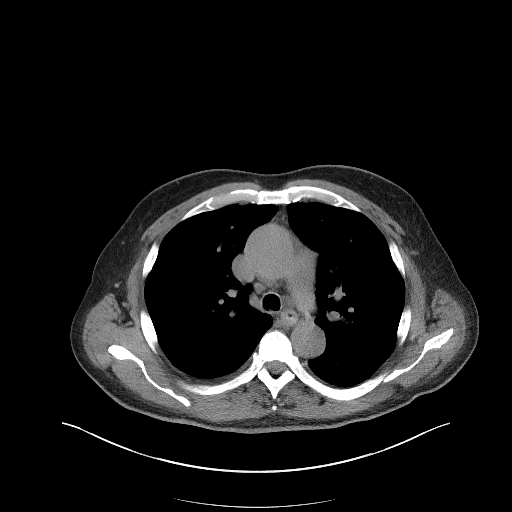
[im 113/138  bone]
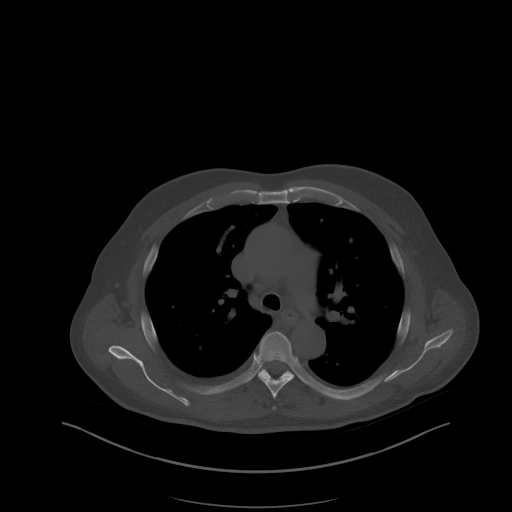
[im 125/138  mediastinal]
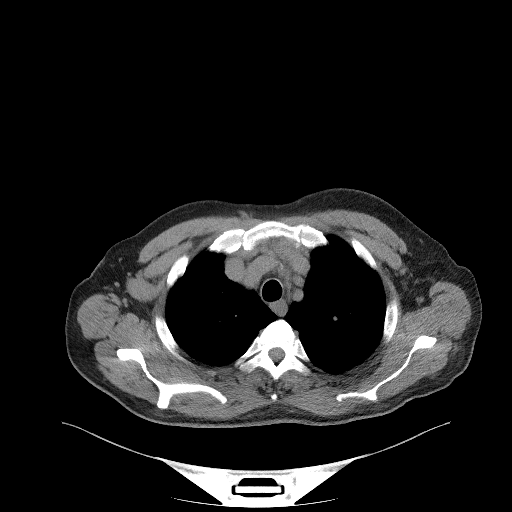

[Series 5: coronal · coronal · 0.87mm/px · 3 of 168 slices shown]
[im 34/168  mediastinal]
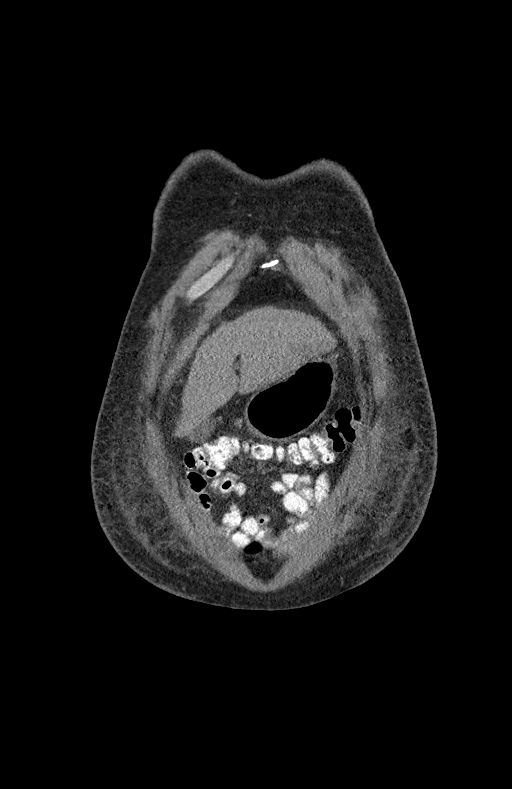
[im 67/168  mediastinal]
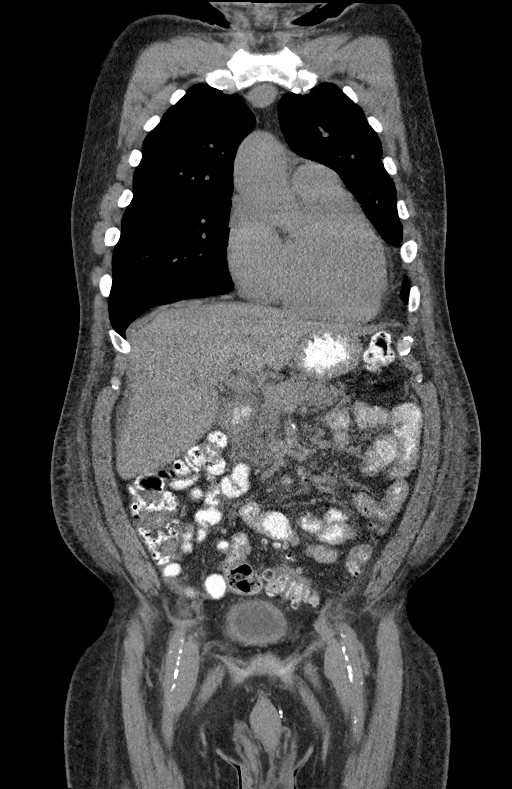
[im 101/168  mediastinal]
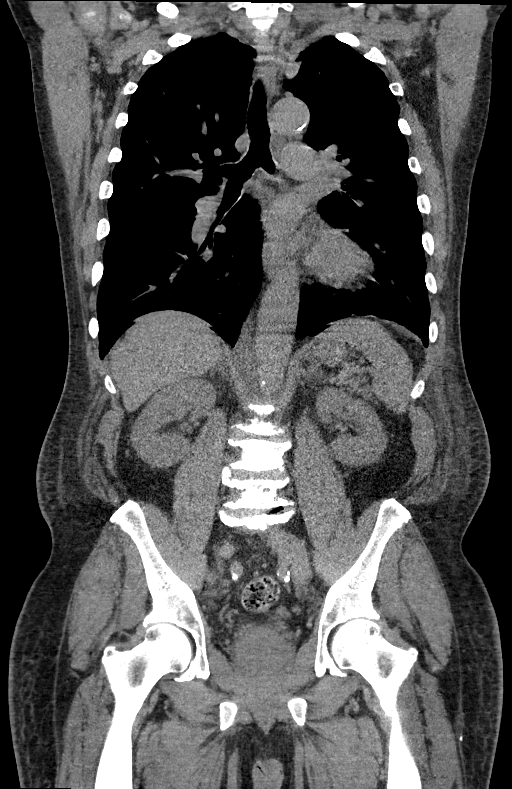

[13 of 36 positions shown; findings below may reference images not displayed]

FINDINGS: CT CHEST FINDINGS

Cardiovascular: Extensive multi-vessel coronary artery
calcification. Cardiac size is within normal limits. No pericardial
effusion. The central pulmonary arteries are enlarged in keeping
with changes of pulmonary arterial hypertension. Moderate
atherosclerotic calcification within the thoracic aorta. The
ascending aorta is dilated, measuring 5.1 cm in diameter, stable
since prior examination. Descending thoracic aorta is of normal
caliber.

Mediastinum/Nodes: Visualized thyroid is unremarkable. No pathologic
thoracic adenopathy. Esophagus unremarkable.

Lungs/Pleura: Moderate emphysema. Mild bronchial wall thickening in
keeping with airway inflammation. No focal pulmonary nodules or
infiltrates identified. No pneumothorax or pleural effusion. No
central obstructing lesion.

Musculoskeletal: No acute bone abnormality. No lytic or blastic bone
lesion.

CT ABDOMEN PELVIS FINDINGS

Hepatobiliary: No focal liver abnormality is seen. No gallstones,
gallbladder wall thickening, or biliary dilatation.

Pancreas: Unremarkable

Spleen: Normal in size without focal abnormality.

Adrenals/Urinary Tract: The adrenal glands are unremarkable. The
kidneys are normal on this noncontrast examination. The bladder is
circumferentially thick walled and there is extensive perivesicular
inflammatory stranding suggesting changes of a diffuse infectious or
inflammatory cystitis. The bladder is not distended.

Stomach/Bowel: Moderate descending and sigmoid colonic
diverticulosis without superimposed acute inflammatory change. The
stomach, small bowel, and large bowel are otherwise unremarkable.
Appendix normal. No free intraperitoneal gas. Trace ascites.

Vascular/Lymphatic: 3.3 x 3.1 cm infrarenal abdominal aortic
aneurysm is present. Superimposed moderate aortoiliac
atherosclerotic calcification. No pathologic adenopathy within the
abdomen and pelvis.

Reproductive: Marked prostatic enlargement is present within
estimated prostatic volume of 123 cc. There is mild periprostatic
inflammatory stranding also identified.

Other: Mild diffuse body wall subcutaneous edema. Mild diffuse
retroperitoneal edema. No abdominal wall hernia.

Musculoskeletal: No acute bone abnormality. Degenerative changes are
seen within the lumbar spine. No lytic or blastic bone lesion.
IMPRESSION: No acute intrathoracic or intra-abdominal pathology identified.

Extensive multi-vessel coronary artery calcification. Morphologic
changes in keeping with pulmonary arterial hypertension.

Ascending thoracic aortic aneurysm with maximal transaxial dimension
of 5.1 cm, stable since prior examination of 09/01/2019. Ascending
thoracic aortic aneurysm. Recommend semi-annual imaging followup by
CTA or MRA and referral to cardiothoracic surgery if not already
obtained. This recommendation follows 4888
ACCF/AHA/AATS/ACR/ASA/SCA/MARCUS/NINA/HJ SAFAR/EVERTSZ Guidelines for the
Diagnosis and Management of Patients With Thoracic Aortic Disease.
Circulation. 4888; 121: E266-e369. Aortic aneurysm NOS ([id number].[id number])

Moderate emphysema.

Marked prostatic enlargement. Superimposed periprostatic and
perivesicular inflammatory stranding suggesting changes of
infectious or inflammatory cysto prostatitis. No evidence of
secondary urinary retention.

Moderate distal colonic diverticulosis without superimposed acute
inflammatory change.

3.3 cm infrarenal abdominal aortic aneurysm. Recommend follow-up
every 3 years.

Reference: [HOSPITAL] 9699;[DATE].

Mild anasarca with mild diffuse body wall subcutaneous edema, trace
ascites, and retroperitoneal edema.

## 2023-11-13 IMAGING — DX DG CHEST 1V PORT
1 series · 1 of 1 positions shown · non-contrast
Comparison: 06/21/2020, CT 01/18/2022

CLINICAL DATA: Low oxygen saturation

EXAM:
PORTABLE CHEST 1 VIEW

[chest ap]
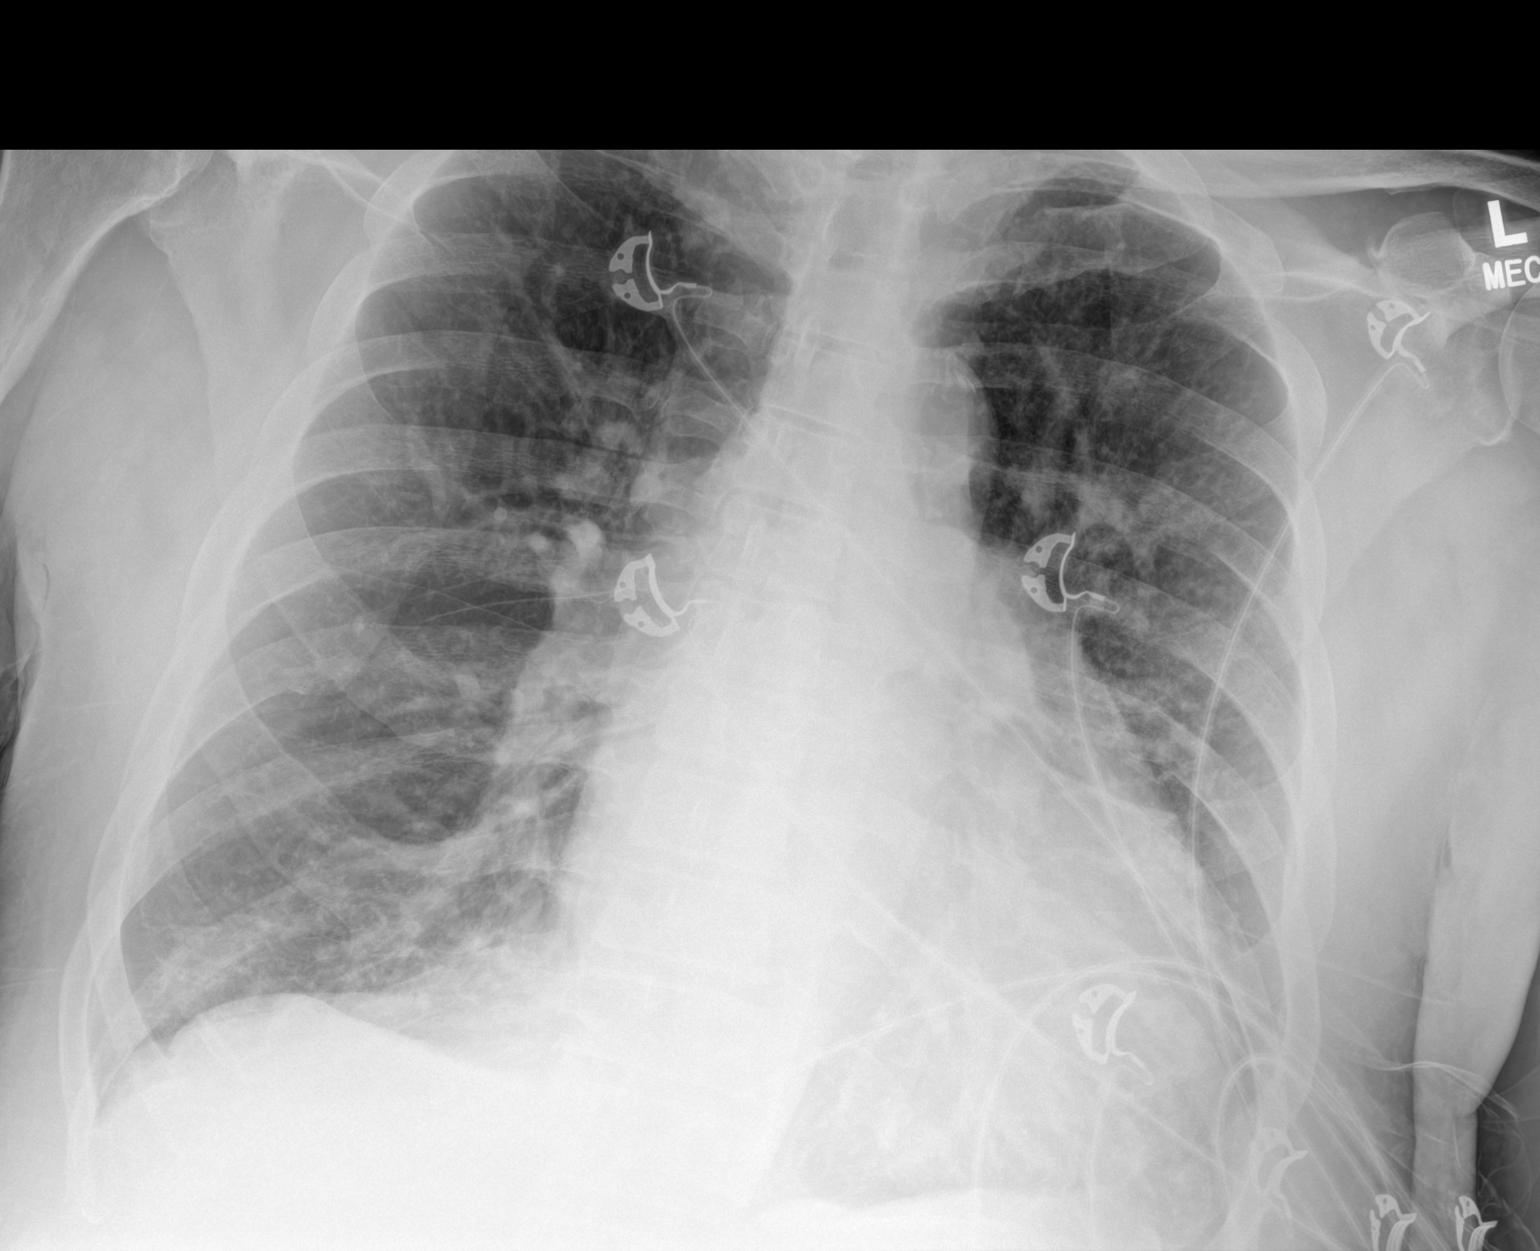

[1 of 1 positions shown; findings below may reference images not displayed]

FINDINGS: Cardiomegaly with central congestion. Streaky atelectasis or
scarring at the bases. No pleural effusion or pneumothorax.
Emphysematous disease.
IMPRESSION: 1. Cardiomegaly with central congestion.
2. Hazy atelectasis or scarring right base
3. Emphysema
